# Patient Record
Sex: Male | Born: 1935
Health system: Southern US, Community
[De-identification: ages and names within clinical notes are randomized; demographics above are authoritative.]

## PROBLEM LIST (undated history)

## (undated) DIAGNOSIS — I2510 Atherosclerotic heart disease of native coronary artery without angina pectoris: Secondary | ICD-10-CM

## (undated) DIAGNOSIS — K5790 Diverticulosis of intestine, part unspecified, without perforation or abscess without bleeding: Secondary | ICD-10-CM

## (undated) DIAGNOSIS — I739 Peripheral vascular disease, unspecified: Secondary | ICD-10-CM

## (undated) DIAGNOSIS — N400 Enlarged prostate without lower urinary tract symptoms: Secondary | ICD-10-CM

## (undated) DIAGNOSIS — R059 Cough, unspecified: Secondary | ICD-10-CM

## (undated) DIAGNOSIS — I10 Essential (primary) hypertension: Secondary | ICD-10-CM

## (undated) DIAGNOSIS — F419 Anxiety disorder, unspecified: Secondary | ICD-10-CM

## (undated) DIAGNOSIS — K648 Other hemorrhoids: Secondary | ICD-10-CM

## (undated) DIAGNOSIS — R945 Abnormal results of liver function studies: Secondary | ICD-10-CM

## (undated) DIAGNOSIS — H269 Unspecified cataract: Secondary | ICD-10-CM

## (undated) DIAGNOSIS — I480 Paroxysmal atrial fibrillation: Secondary | ICD-10-CM

## (undated) DIAGNOSIS — R05 Cough: Secondary | ICD-10-CM

## (undated) DIAGNOSIS — T7840XA Allergy, unspecified, initial encounter: Secondary | ICD-10-CM

## (undated) DIAGNOSIS — I4510 Unspecified right bundle-branch block: Secondary | ICD-10-CM

## (undated) DIAGNOSIS — I200 Unstable angina: Secondary | ICD-10-CM

## (undated) DIAGNOSIS — I219 Acute myocardial infarction, unspecified: Secondary | ICD-10-CM

## (undated) DIAGNOSIS — I498 Other specified cardiac arrhythmias: Secondary | ICD-10-CM

## (undated) DIAGNOSIS — I779 Disorder of arteries and arterioles, unspecified: Secondary | ICD-10-CM

## (undated) DIAGNOSIS — I499 Cardiac arrhythmia, unspecified: Secondary | ICD-10-CM

## (undated) DIAGNOSIS — Z95810 Presence of automatic (implantable) cardiac defibrillator: Secondary | ICD-10-CM

## (undated) DIAGNOSIS — R7989 Other specified abnormal findings of blood chemistry: Secondary | ICD-10-CM

## (undated) DIAGNOSIS — I509 Heart failure, unspecified: Secondary | ICD-10-CM

## (undated) DIAGNOSIS — I469 Cardiac arrest, cause unspecified: Secondary | ICD-10-CM

## (undated) DIAGNOSIS — K219 Gastro-esophageal reflux disease without esophagitis: Secondary | ICD-10-CM

## (undated) DIAGNOSIS — D126 Benign neoplasm of colon, unspecified: Secondary | ICD-10-CM

## (undated) DIAGNOSIS — E785 Hyperlipidemia, unspecified: Secondary | ICD-10-CM

## (undated) HISTORY — PX: COLONOSCOPY: SHX174

## (undated) HISTORY — DX: Unspecified cataract: H26.9

## (undated) HISTORY — PX: APPENDECTOMY: SHX54

## (undated) HISTORY — DX: Acute myocardial infarction, unspecified: I21.9

## (undated) HISTORY — PX: EYE SURGERY: SHX253

## (undated) HISTORY — DX: Gastro-esophageal reflux disease without esophagitis: K21.9

## (undated) HISTORY — DX: Allergy, unspecified, initial encounter: T78.40XA

## (undated) HISTORY — DX: Other hemorrhoids: K64.8

## (undated) HISTORY — DX: Benign prostatic hyperplasia without lower urinary tract symptoms: N40.0

## (undated) HISTORY — DX: Atherosclerotic heart disease of native coronary artery without angina pectoris: I25.10

## (undated) HISTORY — DX: Essential (primary) hypertension: I10

## (undated) HISTORY — DX: Hyperlipidemia, unspecified: E78.5

## (undated) HISTORY — DX: Benign neoplasm of colon, unspecified: D12.6

## (undated) HISTORY — DX: Diverticulosis of intestine, part unspecified, without perforation or abscess without bleeding: K57.90

---

## 2001-03-18 ENCOUNTER — Encounter: Payer: Self-pay | Admitting: Neurosurgery

## 2001-03-20 ENCOUNTER — Encounter: Payer: Self-pay | Admitting: Neurosurgery

## 2001-03-20 ENCOUNTER — Observation Stay (HOSPITAL_COMMUNITY): Admission: RE | Admit: 2001-03-20 | Discharge: 2001-03-21 | Payer: Self-pay | Admitting: Neurosurgery

## 2001-06-15 ENCOUNTER — Ambulatory Visit (HOSPITAL_COMMUNITY): Admission: RE | Admit: 2001-06-15 | Discharge: 2001-06-15 | Payer: Self-pay | Admitting: Neurosurgery

## 2001-06-15 ENCOUNTER — Encounter: Payer: Self-pay | Admitting: Neurosurgery

## 2001-06-30 ENCOUNTER — Encounter: Admission: RE | Admit: 2001-06-30 | Discharge: 2001-06-30 | Payer: Self-pay | Admitting: Neurosurgery

## 2001-06-30 ENCOUNTER — Encounter: Payer: Self-pay | Admitting: Neurosurgery

## 2001-07-14 ENCOUNTER — Encounter: Admission: RE | Admit: 2001-07-14 | Discharge: 2001-07-14 | Payer: Self-pay | Admitting: Neurosurgery

## 2001-07-14 ENCOUNTER — Encounter: Payer: Self-pay | Admitting: Neurosurgery

## 2003-11-26 HISTORY — PX: CORONARY ARTERY BYPASS GRAFT: SHX141

## 2004-07-20 ENCOUNTER — Encounter: Payer: Self-pay | Admitting: Cardiology

## 2004-07-23 ENCOUNTER — Inpatient Hospital Stay (HOSPITAL_COMMUNITY): Admission: AD | Admit: 2004-07-23 | Discharge: 2004-08-01 | Payer: Self-pay | Admitting: Cardiology

## 2004-07-27 ENCOUNTER — Encounter: Payer: Self-pay | Admitting: Cardiology

## 2004-07-27 ENCOUNTER — Encounter (INDEPENDENT_AMBULATORY_CARE_PROVIDER_SITE_OTHER): Payer: Self-pay | Admitting: *Deleted

## 2004-08-27 ENCOUNTER — Encounter (HOSPITAL_COMMUNITY): Admission: RE | Admit: 2004-08-27 | Discharge: 2004-11-25 | Payer: Self-pay | Admitting: Cardiology

## 2004-10-12 ENCOUNTER — Ambulatory Visit: Payer: Self-pay | Admitting: Cardiology

## 2004-10-25 ENCOUNTER — Ambulatory Visit: Payer: Self-pay | Admitting: Cardiology

## 2004-11-26 ENCOUNTER — Encounter (HOSPITAL_COMMUNITY): Admission: RE | Admit: 2004-11-26 | Discharge: 2004-12-10 | Payer: Self-pay | Admitting: Cardiology

## 2004-12-11 ENCOUNTER — Encounter (HOSPITAL_COMMUNITY): Admission: RE | Admit: 2004-12-11 | Discharge: 2005-03-11 | Payer: Self-pay | Admitting: Cardiology

## 2005-06-21 ENCOUNTER — Ambulatory Visit: Payer: Self-pay | Admitting: Cardiology

## 2005-06-27 ENCOUNTER — Ambulatory Visit: Payer: Self-pay

## 2005-10-15 ENCOUNTER — Ambulatory Visit: Payer: Self-pay | Admitting: Cardiology

## 2005-10-16 ENCOUNTER — Ambulatory Visit: Payer: Self-pay | Admitting: Cardiology

## 2005-11-25 HISTORY — PX: CARDIAC CATHETERIZATION: SHX172

## 2006-01-16 ENCOUNTER — Ambulatory Visit: Payer: Self-pay | Admitting: Internal Medicine

## 2006-04-04 ENCOUNTER — Ambulatory Visit: Payer: Self-pay | Admitting: Cardiology

## 2006-04-14 ENCOUNTER — Ambulatory Visit: Payer: Self-pay | Admitting: Cardiology

## 2006-04-14 ENCOUNTER — Ambulatory Visit: Payer: Self-pay

## 2006-06-05 ENCOUNTER — Ambulatory Visit: Payer: Self-pay | Admitting: Internal Medicine

## 2006-08-26 ENCOUNTER — Ambulatory Visit: Payer: Self-pay | Admitting: Cardiology

## 2006-08-26 ENCOUNTER — Observation Stay (HOSPITAL_COMMUNITY): Admission: EM | Admit: 2006-08-26 | Discharge: 2006-08-27 | Payer: Self-pay | Admitting: Emergency Medicine

## 2006-09-10 ENCOUNTER — Ambulatory Visit: Payer: Self-pay | Admitting: Internal Medicine

## 2006-09-16 ENCOUNTER — Ambulatory Visit: Payer: Self-pay | Admitting: Cardiology

## 2006-10-21 ENCOUNTER — Ambulatory Visit: Payer: Self-pay | Admitting: Cardiology

## 2006-12-09 ENCOUNTER — Ambulatory Visit: Payer: Self-pay | Admitting: Internal Medicine

## 2006-12-17 ENCOUNTER — Ambulatory Visit: Payer: Self-pay | Admitting: Internal Medicine

## 2007-06-01 ENCOUNTER — Encounter: Payer: Self-pay | Admitting: Cardiology

## 2007-06-01 ENCOUNTER — Ambulatory Visit: Payer: Self-pay | Admitting: Cardiology

## 2007-06-01 ENCOUNTER — Observation Stay (HOSPITAL_COMMUNITY): Admission: EM | Admit: 2007-06-01 | Discharge: 2007-06-02 | Payer: Self-pay | Admitting: Emergency Medicine

## 2007-06-01 ENCOUNTER — Ambulatory Visit: Payer: Self-pay | Admitting: Cardiovascular Disease

## 2007-06-02 ENCOUNTER — Encounter: Payer: Self-pay | Admitting: Cardiovascular Disease

## 2007-06-08 ENCOUNTER — Ambulatory Visit: Payer: Self-pay

## 2007-06-09 ENCOUNTER — Ambulatory Visit: Payer: Self-pay | Admitting: Internal Medicine

## 2007-06-09 ENCOUNTER — Ambulatory Visit: Payer: Self-pay | Admitting: Cardiology

## 2007-08-24 ENCOUNTER — Ambulatory Visit: Payer: Self-pay | Admitting: Internal Medicine

## 2007-08-24 LAB — CONVERTED CEMR LAB
ALT: 22 units/L (ref 0–53)
AST: 27 units/L (ref 0–37)
BUN: 18 mg/dL (ref 6–23)
CO2: 32 meq/L (ref 19–32)
Calcium: 9.8 mg/dL (ref 8.4–10.5)
Chloride: 106 meq/L (ref 96–112)
Cholesterol: 144 mg/dL (ref 0–200)
Creatinine, Ser: 1 mg/dL (ref 0.4–1.5)
GFR calc Af Amer: 94 mL/min
GFR calc non Af Amer: 78 mL/min
Glucose, Bld: 94 mg/dL (ref 70–99)
HDL: 44.2 mg/dL (ref >39.0)
LDL Cholesterol: 80 mg/dL (ref 0–99)
PSA: 6.46 ng/mL — ABNORMAL HIGH (ref 0.10–4.00)
Potassium: 5.1 meq/L (ref 3.5–5.1)
Sodium: 143 meq/L (ref 135–145)
Total CHOL/HDL Ratio: 3.3
Total CK: 108 units/L (ref 7–195)
Triglycerides: 98 mg/dL (ref 0–149)
VLDL: 20 mg/dL (ref 0–40)

## 2007-08-27 ENCOUNTER — Ambulatory Visit: Payer: Self-pay | Admitting: Internal Medicine

## 2007-09-01 ENCOUNTER — Encounter: Payer: Self-pay | Admitting: Internal Medicine

## 2007-09-01 DIAGNOSIS — N400 Enlarged prostate without lower urinary tract symptoms: Secondary | ICD-10-CM | POA: Insufficient documentation

## 2007-09-01 DIAGNOSIS — E785 Hyperlipidemia, unspecified: Secondary | ICD-10-CM | POA: Insufficient documentation

## 2007-09-01 DIAGNOSIS — I10 Essential (primary) hypertension: Secondary | ICD-10-CM | POA: Insufficient documentation

## 2007-09-01 DIAGNOSIS — K219 Gastro-esophageal reflux disease without esophagitis: Secondary | ICD-10-CM | POA: Insufficient documentation

## 2007-09-01 DIAGNOSIS — I2510 Atherosclerotic heart disease of native coronary artery without angina pectoris: Secondary | ICD-10-CM | POA: Insufficient documentation

## 2007-09-09 ENCOUNTER — Encounter: Payer: Self-pay | Admitting: Internal Medicine

## 2007-09-09 ENCOUNTER — Ambulatory Visit: Payer: Self-pay | Admitting: Internal Medicine

## 2007-09-09 DIAGNOSIS — M549 Dorsalgia, unspecified: Secondary | ICD-10-CM | POA: Insufficient documentation

## 2007-09-11 ENCOUNTER — Encounter: Payer: Self-pay | Admitting: Internal Medicine

## 2007-09-26 HISTORY — PX: LUMBAR FUSION: SHX111

## 2007-12-02 ENCOUNTER — Ambulatory Visit: Payer: Self-pay | Admitting: Internal Medicine

## 2007-12-02 LAB — CONVERTED CEMR LAB
ALT: 18 units/L (ref 0–53)
AST: 28 units/L (ref 0–37)
BUN: 15 mg/dL (ref 6–23)
CO2: 30 meq/L (ref 19–32)
Calcium: 9.9 mg/dL (ref 8.4–10.5)
Chloride: 103 meq/L (ref 96–112)
Cholesterol: 145 mg/dL (ref 0–200)
Creatinine, Ser: 1 mg/dL (ref 0.4–1.5)
GFR calc Af Amer: 94 mL/min
GFR calc non Af Amer: 78 mL/min
Glucose, Bld: 96 mg/dL (ref 70–99)
HDL: 41.5 mg/dL (ref >39.0)
LDL Cholesterol: 88 mg/dL (ref 0–99)
Potassium: 4.6 meq/L (ref 3.5–5.1)
Sodium: 140 meq/L (ref 135–145)
Total CHOL/HDL Ratio: 3.5
Total CK: 162 units/L (ref 7–195)
Triglycerides: 80 mg/dL (ref 0–149)
VLDL: 16 mg/dL (ref 0–40)

## 2007-12-07 ENCOUNTER — Ambulatory Visit: Payer: Self-pay | Admitting: Internal Medicine

## 2007-12-07 DIAGNOSIS — G4700 Insomnia, unspecified: Secondary | ICD-10-CM | POA: Insufficient documentation

## 2007-12-17 ENCOUNTER — Ambulatory Visit: Payer: Self-pay | Admitting: Cardiology

## 2008-01-05 ENCOUNTER — Ambulatory Visit: Payer: Medicare Other | Admitting: Neurosurgery

## 2008-03-07 ENCOUNTER — Ambulatory Visit: Payer: Self-pay | Admitting: Internal Medicine

## 2008-06-22 ENCOUNTER — Ambulatory Visit: Payer: Self-pay | Admitting: Cardiology

## 2008-06-22 LAB — CONVERTED CEMR LAB
ALT: 19 units/L (ref 0–53)
AST: 27 units/L (ref 0–37)
Albumin: 3.6 g/dL (ref 3.5–5.2)
Alkaline Phosphatase: 57 units/L (ref 39–117)
BUN: 16 mg/dL (ref 6–23)
Basophils Absolute: 0 10*3/uL (ref 0.0–0.1)
Basophils Relative: 0.7 % (ref 0.0–3.0)
Bilirubin, Direct: 0.1 mg/dL (ref 0.0–0.3)
CO2: 30 meq/L (ref 19–32)
Calcium: 9.9 mg/dL (ref 8.4–10.5)
Chloride: 101 meq/L (ref 96–112)
Cholesterol: 142 mg/dL (ref 0–200)
Creatinine, Ser: 1.1 mg/dL (ref 0.4–1.5)
Eosinophils Absolute: 0.2 10*3/uL (ref 0.0–0.7)
Eosinophils Relative: 4.2 % (ref 0.0–5.0)
GFR calc Af Amer: 84 mL/min
GFR calc non Af Amer: 70 mL/min
Glucose, Bld: 102 mg/dL — ABNORMAL HIGH (ref 70–99)
HCT: 36.1 % — ABNORMAL LOW (ref 39.0–52.0)
HDL: 35.5 mg/dL — ABNORMAL LOW (ref >39.0)
Hemoglobin: 12.5 g/dL — ABNORMAL LOW (ref 13.0–17.0)
LDL Cholesterol: 93 mg/dL (ref 0–99)
Lymphocytes Relative: 26.9 % (ref 12.0–46.0)
MCHC: 34.6 g/dL (ref 30.0–36.0)
MCV: 90.4 fL (ref 78.0–100.0)
Monocytes Absolute: 0.4 10*3/uL (ref 0.1–1.0)
Monocytes Relative: 6.4 % (ref 3.0–12.0)
Neutro Abs: 3.7 10*3/uL (ref 1.4–7.7)
Neutrophils Relative %: 61.8 % (ref 43.0–77.0)
Platelets: 200 10*3/uL (ref 150–400)
Potassium: 4.5 meq/L (ref 3.5–5.1)
RBC: 4 M/uL — ABNORMAL LOW (ref 4.22–5.81)
RDW: 12.2 % (ref 11.5–14.6)
Sodium: 137 meq/L (ref 135–145)
TSH: 1.39 microintl units/mL (ref 0.35–5.50)
Total Bilirubin: 0.6 mg/dL (ref 0.3–1.2)
Total CHOL/HDL Ratio: 4
Total Protein: 7.3 g/dL (ref 6.0–8.3)
Triglycerides: 68 mg/dL (ref 0–149)
VLDL: 14 mg/dL (ref 0–40)
WBC: 5.9 10*3/uL (ref 4.5–10.5)

## 2008-07-15 ENCOUNTER — Ambulatory Visit: Payer: Self-pay | Admitting: Cardiovascular Disease

## 2008-08-24 ENCOUNTER — Encounter: Payer: Self-pay | Admitting: Internal Medicine

## 2008-09-07 ENCOUNTER — Ambulatory Visit: Payer: Self-pay | Admitting: Internal Medicine

## 2008-10-11 ENCOUNTER — Ambulatory Visit: Payer: Self-pay | Admitting: Cardiology

## 2008-10-11 ENCOUNTER — Encounter: Payer: Self-pay | Admitting: Cardiology

## 2008-10-11 DIAGNOSIS — IMO0001 Reserved for inherently not codable concepts without codable children: Secondary | ICD-10-CM | POA: Insufficient documentation

## 2008-10-11 DIAGNOSIS — Z951 Presence of aortocoronary bypass graft: Secondary | ICD-10-CM | POA: Insufficient documentation

## 2008-12-08 ENCOUNTER — Encounter: Payer: Self-pay | Admitting: Internal Medicine

## 2009-03-01 ENCOUNTER — Ambulatory Visit: Payer: Self-pay | Admitting: Internal Medicine

## 2009-03-01 LAB — CONVERTED CEMR LAB
ALT: 30 units/L (ref 0–53)
AST: 30 units/L (ref 0–37)
Albumin: 3.8 g/dL (ref 3.5–5.2)
Alkaline Phosphatase: 63 units/L (ref 39–117)
BUN: 10 mg/dL (ref 6–23)
Bilirubin, Direct: 0.1 mg/dL (ref 0.0–0.3)
CO2: 31 meq/L (ref 19–32)
Calcium: 9.8 mg/dL (ref 8.4–10.5)
Chloride: 105 meq/L (ref 96–112)
Cholesterol: 150 mg/dL (ref 0–200)
Creatinine, Ser: 1 mg/dL (ref 0.4–1.5)
GFR calc non Af Amer: 77.55 mL/min (ref >60)
Glucose, Bld: 90 mg/dL (ref 70–99)
HDL: 47.2 mg/dL (ref >39.00)
LDL Cholesterol: 91 mg/dL (ref 0–99)
Potassium: 4.8 meq/L (ref 3.5–5.1)
Sodium: 140 meq/L (ref 135–145)
TSH: 2.12 microintl units/mL (ref 0.35–5.50)
Total Bilirubin: 1.1 mg/dL (ref 0.3–1.2)
Total CHOL/HDL Ratio: 3
Total Protein: 6.7 g/dL (ref 6.0–8.3)
Triglycerides: 59 mg/dL (ref 0.0–149.0)
VLDL: 11.8 mg/dL (ref 0.0–40.0)

## 2009-03-08 ENCOUNTER — Ambulatory Visit: Payer: Self-pay | Admitting: Internal Medicine

## 2009-04-17 ENCOUNTER — Encounter: Payer: Self-pay | Admitting: Cardiology

## 2009-05-18 ENCOUNTER — Ambulatory Visit: Payer: Self-pay | Admitting: Cardiology

## 2009-06-05 ENCOUNTER — Ambulatory Visit: Payer: Self-pay | Admitting: Cardiology

## 2009-06-07 LAB — CONVERTED CEMR LAB
ALT: 21 U/L
AST: 27 U/L
Albumin: 3.6 g/dL
Alkaline Phosphatase: 64 U/L
BUN: 14 mg/dL
Basophils Absolute: 0 K/uL
Basophils Relative: 0.4 %
Bilirubin, Direct: 0.1 mg/dL
CO2: 29 meq/L
Calcium: 9.7 mg/dL
Chloride: 103 meq/L
Cholesterol: 157 mg/dL
Creatinine, Ser: 1 mg/dL
Eosinophils Absolute: 0.2 K/uL
Eosinophils Relative: 4 %
GFR calc non Af Amer: 77.51 mL/min
Glucose, Bld: 95 mg/dL
HCT: 39.4 %
HDL: 50.7 mg/dL
Hemoglobin: 13.6 g/dL
LDL Cholesterol: 96 mg/dL
Lymphocytes Relative: 31.1 %
Lymphs Abs: 1.8 K/uL
MCHC: 34.6 g/dL
MCV: 92 fL
Monocytes Absolute: 0.4 K/uL
Monocytes Relative: 6.7 %
Neutro Abs: 3.5 K/uL
Neutrophils Relative %: 57.8 %
Platelets: 142 K/uL — ABNORMAL LOW
Potassium: 5 meq/L
RBC: 4.28 M/uL
RDW: 11.9 %
Sodium: 138 meq/L
Total Bilirubin: 0.9 mg/dL
Total CHOL/HDL Ratio: 3
Total Protein: 7.1 g/dL
Triglycerides: 54 mg/dL
VLDL: 10.8 mg/dL
WBC: 5.9 10*3/microliter

## 2009-06-19 ENCOUNTER — Encounter: Payer: Self-pay | Admitting: Internal Medicine

## 2009-06-21 ENCOUNTER — Encounter: Payer: Self-pay | Admitting: Cardiology

## 2009-06-26 ENCOUNTER — Telehealth: Payer: Self-pay | Admitting: Cardiology

## 2009-07-11 ENCOUNTER — Ambulatory Visit: Payer: Self-pay

## 2009-07-11 ENCOUNTER — Encounter: Payer: Self-pay | Admitting: Cardiology

## 2009-07-20 ENCOUNTER — Encounter (INDEPENDENT_AMBULATORY_CARE_PROVIDER_SITE_OTHER): Payer: Self-pay | Admitting: *Deleted

## 2009-07-25 ENCOUNTER — Encounter: Payer: Self-pay | Admitting: Cardiology

## 2009-09-22 ENCOUNTER — Encounter: Payer: Self-pay | Admitting: Cardiology

## 2009-11-29 ENCOUNTER — Ambulatory Visit: Payer: Self-pay | Admitting: Internal Medicine

## 2009-11-29 LAB — CONVERTED CEMR LAB
ALT: 22 units/L (ref 0–53)
AST: 29 units/L (ref 0–37)
Albumin: 3.8 g/dL (ref 3.5–5.2)
Alkaline Phosphatase: 55 units/L (ref 39–117)
BUN: 13 mg/dL (ref 6–23)
Bilirubin, Direct: 0.1 mg/dL (ref 0.0–0.3)
CO2: 30 meq/L (ref 19–32)
Calcium: 10.3 mg/dL (ref 8.4–10.5)
Chloride: 105 meq/L (ref 96–112)
Cholesterol: 163 mg/dL (ref 0–200)
Creatinine, Ser: 1.2 mg/dL (ref 0.4–1.5)
GFR calc non Af Amer: 62.71 mL/min (ref >60)
Glucose, Bld: 93 mg/dL (ref 70–99)
HDL: 53.6 mg/dL (ref >39.00)
LDL Cholesterol: 96 mg/dL (ref 0–99)
Potassium: 5.2 meq/L — ABNORMAL HIGH (ref 3.5–5.1)
Sodium: 140 meq/L (ref 135–145)
TSH: 1.44 microintl units/mL (ref 0.35–5.50)
Total Bilirubin: 1 mg/dL (ref 0.3–1.2)
Total CHOL/HDL Ratio: 3
Total Protein: 7.6 g/dL (ref 6.0–8.3)
Triglycerides: 65 mg/dL (ref 0.0–149.0)
VLDL: 13 mg/dL (ref 0.0–40.0)

## 2009-12-11 ENCOUNTER — Ambulatory Visit: Payer: Self-pay | Admitting: Internal Medicine

## 2010-05-04 ENCOUNTER — Telehealth (INDEPENDENT_AMBULATORY_CARE_PROVIDER_SITE_OTHER): Payer: Self-pay | Admitting: *Deleted

## 2010-06-08 ENCOUNTER — Encounter (INDEPENDENT_AMBULATORY_CARE_PROVIDER_SITE_OTHER): Payer: Self-pay | Admitting: *Deleted

## 2010-06-08 ENCOUNTER — Ambulatory Visit: Payer: Self-pay | Admitting: Cardiology

## 2010-06-08 LAB — CONVERTED CEMR LAB
ALT: 22 units/L (ref 0–53)
AST: 27 units/L (ref 0–37)
Albumin: 4 g/dL (ref 3.5–5.2)
Alkaline Phosphatase: 75 units/L (ref 39–117)
BUN: 19 mg/dL (ref 6–23)
Basophils Absolute: 0 10*3/uL (ref 0.0–0.1)
Basophils Relative: 0.4 % (ref 0.0–3.0)
Bilirubin, Direct: 0.1 mg/dL (ref 0.0–0.3)
CO2: 31 meq/L (ref 19–32)
Calcium: 9.9 mg/dL (ref 8.4–10.5)
Chloride: 108 meq/L (ref 96–112)
Cholesterol: 151 mg/dL (ref 0–200)
Creatinine, Ser: 1 mg/dL (ref 0.4–1.5)
Eosinophils Absolute: 0.2 10*3/uL (ref 0.0–0.7)
Eosinophils Relative: 3.1 % (ref 0.0–5.0)
GFR calc non Af Amer: 80.05 mL/min (ref >60)
Glucose, Bld: 87 mg/dL (ref 70–99)
HCT: 39.4 % (ref 39.0–52.0)
HDL: 52.6 mg/dL (ref >39.00)
Hemoglobin: 13.5 g/dL (ref 13.0–17.0)
LDL Cholesterol: 83 mg/dL (ref 0–99)
Lymphocytes Relative: 34.6 % (ref 12.0–46.0)
Lymphs Abs: 2.1 10*3/uL (ref 0.7–4.0)
MCHC: 34.3 g/dL (ref 30.0–36.0)
MCV: 91.6 fL (ref 78.0–100.0)
Monocytes Absolute: 0.4 10*3/uL (ref 0.1–1.0)
Monocytes Relative: 7.2 % (ref 3.0–12.0)
Neutro Abs: 3.4 10*3/uL (ref 1.4–7.7)
Neutrophils Relative %: 54.7 % (ref 43.0–77.0)
Platelets: 139 10*3/uL — ABNORMAL LOW (ref 150.0–400.0)
Potassium: 5.1 meq/L (ref 3.5–5.1)
RBC: 4.31 M/uL (ref 4.22–5.81)
RDW: 13.3 % (ref 11.5–14.6)
Sodium: 138 meq/L (ref 135–145)
Total Bilirubin: 0.6 mg/dL (ref 0.3–1.2)
Total CHOL/HDL Ratio: 3
Total Protein: 7 g/dL (ref 6.0–8.3)
Triglycerides: 78 mg/dL (ref 0.0–149.0)
VLDL: 15.6 mg/dL (ref 0.0–40.0)
WBC: 6.1 10*3/uL (ref 4.5–10.5)

## 2010-06-15 ENCOUNTER — Ambulatory Visit: Payer: Self-pay | Admitting: Cardiology

## 2010-10-04 ENCOUNTER — Encounter: Payer: Self-pay | Admitting: Internal Medicine

## 2010-12-07 ENCOUNTER — Other Ambulatory Visit: Payer: Self-pay | Admitting: Internal Medicine

## 2010-12-07 ENCOUNTER — Ambulatory Visit
Admission: RE | Admit: 2010-12-07 | Discharge: 2010-12-07 | Payer: Self-pay | Source: Home / Self Care | Attending: Internal Medicine | Admitting: Internal Medicine

## 2010-12-07 LAB — BASIC METABOLIC PANEL
BUN: 17 mg/dL (ref 6–23)
CO2: 31 mEq/L (ref 19–32)
Calcium: 10.2 mg/dL (ref 8.4–10.5)
Chloride: 105 mEq/L (ref 96–112)
Creatinine, Ser: 0.9 mg/dL (ref 0.4–1.5)
GFR: 89.45 mL/min (ref >60.00)
Glucose, Bld: 93 mg/dL (ref 70–99)
Potassium: 4.7 mEq/L (ref 3.5–5.1)
Sodium: 140 mEq/L (ref 135–145)

## 2010-12-07 LAB — HEPATIC FUNCTION PANEL
ALT: 17 U/L (ref 0–53)
AST: 27 U/L (ref 0–37)
Albumin: 3.8 g/dL (ref 3.5–5.2)
Alkaline Phosphatase: 61 U/L (ref 39–117)
Bilirubin, Direct: 0.1 mg/dL (ref 0.0–0.3)
Total Bilirubin: 0.8 mg/dL (ref 0.3–1.2)
Total Protein: 6.9 g/dL (ref 6.0–8.3)

## 2010-12-07 LAB — LIPID PANEL
Cholesterol: 185 mg/dL (ref 0–200)
HDL: 52 mg/dL (ref >39.00)
LDL Cholesterol: 121 mg/dL — ABNORMAL HIGH (ref 0–99)
Total CHOL/HDL Ratio: 4
Triglycerides: 60 mg/dL (ref 0.0–149.0)
VLDL: 12 mg/dL (ref 0.0–40.0)

## 2010-12-07 LAB — CK: Total CK: 176 U/L (ref 7–232)

## 2010-12-07 LAB — TSH: TSH: 1.24 u[IU]/mL (ref 0.35–5.50)

## 2010-12-12 ENCOUNTER — Ambulatory Visit
Admission: RE | Admit: 2010-12-12 | Discharge: 2010-12-12 | Payer: Self-pay | Source: Home / Self Care | Attending: Internal Medicine | Admitting: Internal Medicine

## 2010-12-12 DIAGNOSIS — R0989 Other specified symptoms and signs involving the circulatory and respiratory systems: Secondary | ICD-10-CM | POA: Insufficient documentation

## 2010-12-12 DIAGNOSIS — R002 Palpitations: Secondary | ICD-10-CM | POA: Insufficient documentation

## 2010-12-13 ENCOUNTER — Encounter: Payer: Self-pay | Admitting: Internal Medicine

## 2010-12-14 ENCOUNTER — Encounter: Payer: Self-pay | Admitting: Internal Medicine

## 2010-12-14 ENCOUNTER — Ambulatory Visit: Admission: RE | Admit: 2010-12-14 | Discharge: 2010-12-14 | Payer: Self-pay | Source: Home / Self Care

## 2010-12-25 NOTE — Letter (Signed)
Summary: Engineer, materials at [facility]  [address]   [place], [place] [address]   Phone: [phone]  Fax: [phone]    July 20, 2009 MRN: [id number]   [name] Christensen [address] [place], [place]  [address]   Dear [name],  Your test ordered by Selena Batten has been reviewed by your physician (or physician assistant) and was found to be normal or stable. Your physician (or physician assistant) felt no changes were needed at this time.  __X__ Echocardiogram  ____ Cardiac Stress Test  ____ Lab Work  ____ Peripheral vascular study of arms, legs or neck  ____ CT scan or X-ray  ____ Lung or Breathing test  ____ Other:   Thank you.   Hoover Brunette, LPN    Duane Boston, M.D., F.A.C.C. Thressa Sheller, M.D., F.A.C.C. Oneal Grout, M.D., F.A.C.C. Cheree Ditto, M.D., F.A.C.C. Daiva Nakayama, M.D., F.A.C.C. Kenney Houseman, M.D., F.A.C.C. Jeanne Ivan, PA-C

## 2010-12-25 NOTE — Assessment & Plan Note (Signed)
Medications Added OXYCODONE HCL 15 MG  TABS (OXYCODONE HCL) 1 qid as needed pain PREDNISONE 10 MG TABS (PREDNISONE) 60mg  once daily x 2d; 30mg  once daily x 2 d;20mg  once daily x 5d; 10mg  once daily x5d. Take pc.       21  22  23  24  25    Vital Signs:  Patient Profile:   77 Years Old Male Weight:      159 pounds Pulse rate:   60 / minute BP sitting:   145 / 72                 Chief Complaint:  Back Pain.  History of Present Illness: LBPx1 mo; lately 10/10 in intensity w/ irrad. to B LE R>L  Current Allergies: ! PENICILLIN PRAVACHOL  Past Medical History:    Reviewed history from 08/27/2007 and no changes required:       Coronary artery disease       GERD       Hyperlipidemia       Hypertension       Benign prostatic hypertrophy   Family History:    Family History of CAD Male 1st degree relative <60  Social History:    Married    Never Smoked    Regular exercise-yes   Risk Factors:  Tobacco use:  never Exercise:  yes    Physical Exam  General:     uncomfortable-appearing.   Neck:     No deformities, masses, or tenderness noted. Lungs:     Normal respiratory effort, chest expands symmetrically. Lungs are clear to auscultation, no crackles or wheezes. Heart:     Normal rate and regular rhythm. S1 and S2 normal without gallop, murmur, click, rub or other extra sounds. Abdomen:     Bowel sounds positive,abdomen soft and non-tender without masses, organomegaly or hernias noted. Msk:     Lumbar-sacral sine is tender to palpation over paraspinal muscles and painfull with the ROM. Strait leg elev is +/- on R, - on L  Skin:     Intact without suspicious lesions or rashes   Problems:  Medical Problems Added: 1)  Dx of Back Pain  (ICD-724.5) 2)  Dx of Family History of Cad Male 1st Degree Relative <60  (ICD-V16.49)   Impression & Recommendations:  Problem # 1:  BACK PAIN (ICD-724.5) Assessment: New  His updated medication list for  this problem includ  His updated medication list for this problem inclu    Oxycodone Hcl 15 Mg Tabs (Oxycodone hcl) .[name]... 1 qid as needed as needed Predn. taper: 60mg  once daily ... w/Axid qd MRI NS cons. Dr Channing Mutters  His updated medication list for this problem includes:    Aspir-low 81 Mg Tbec (Aspirin) .[name]... 1 once daily pc    Oxycodone Hcl 15 Mg Tabs (Oxycodone hcl) .[name]... 1 qid as needed pain   Complete Medication List: 1)  Atenolol 50 Mg Tabs (Atenolol) .... Qd 2)  Plavix 75 Mg Tabs (Clopidogrel bisulfate) .... Qd 3)  Lisinopril 10 Mg Tabs (Lisinopril) .... Once daily 4)  Lipitor 40 Mg Tabs (Atorvastatin calcium) .... 1/2 qd 5)  Flomax 0.4 Mg Cp24 (Tamsulosin hcl) .[name].. 1 qd 6)  Aspir-low 81 Mg Tbec (Aspirin) .[name].. 1 once daily pc 7)  Oxycodone Hcl 15 Mg Tabs (Oxycodone hcl) .[name].. 1 qid as needed pain 8)  Prednisone 10 Mg Tabs (Prednisone) .... 60mg  once daily x 2d; 30mg  once daily x 2 d;20mg  once daily x 5d; 10mg  once  daily x5d. take pc.   Patient Instructions: 1)  Please schedule a follow-up appointment as needed.    Prescriptions: OXYCODONE HCL 15 MG  TABS (OXYCODONE HCL) 1 qid as needed pain  #60 x 0   Entered and Authorized by:   Tresa Garter MD   Signed by:   Tresa Garter MD on 09/09/2007   Method used:   Print then Give to Patient   RxID:   [id number]  ]

## 2010-12-25 NOTE — Assessment & Plan Note (Signed)
Summary: 1 Y R FU PER JULY REMINDER   Visit Type:  Follow-up Primary Provider:  Tresa Garter MD   History of Present Illness: the patient is a 77 year old male with history of coronary artery disease, status post coronary bypass grafting 2005. The patient has a chronic right bundle branch block. He has been doing well. He reports no chest pain or shortness of breath. The patient had a stress echocardiogram done a year ago with a baseline ejection fraction of 55% with inferoseptal and inferobasal hypokinesis. An EKG today he has occasional PVCs but is asymptomatic.[name] He reports no palpitations orthopnea PND presyncope or syncope. His lipid panel is at goal. He remains physically very active and walks 3 miles a day.  Preventive Screening-Counseling & Management  Alcohol-Tobacco     Smoking Status: never  Current Medications (verified): 1)  Atenolol 50 Mg Tabs (Atenolol) .... Once Daily 1 By Mouth 2)  Plavix 75 Mg Tabs (Clopidogrel Bisulfate) .... Take 1 Tablet By Mouth Once A Day 3)  Flomax 0.4 Mg Caps (Tamsulosin Hcl) .... Take 1 Tablet By Mouth Once A Day Hold When Taking Uroxatral 4)  Axid Ar 75 Mg Tabs (Nizatidine) .... Take 1 Tablet By Mouth Once A Day As Needed 5)  Zolpidem Tartrate 10 Mg  Tabs (Zolpidem Tartrate) .... 1/2 or 1 By Mouth At [facility] Prn 6)  Lipitor 40 Mg Tabs (Atorvastatin Calcium) .[name].. 1 Once Daily 7)  Nitrostat 0.4 Mg Subl (Nitroglycerin) .[name].. 1 Sl As Needed 8)  Aspir-Low 81 Mg Tbec (Aspirin) .[name].. 1 Once Daily Pc 9)  Fish Oil 1000 Mg Caps (Omega-3 Fatty Acids) .... Take 2 Tablet By Mouth Two Times A Day 10)  Vitamin D3 1000 Unit  Tabs (Cholecalciferol) .[name].. 1 By Mouth Daily 11)  Valium 5 Mg Tabs (Diazepam) .... Take 1 Tablet By Mouth Once A Day 12)  Zetia 10 Mg Tabs (Ezetimibe) .... Take 1 Tablet By Mouth Once A Day (Only Uses When Not On Lipitor) 13)  Uroxatral 10 Mg Xr24h-Tab (Alfuzosin Hcl) .... Take 1 Tablet By Mouth Once A Day Hold When Taking Flomax 14)   Multivitamins  Tabs (Multiple Vitamin) .... Take 1 Tablet By Mouth Once A Day 15)  Doxycycline Hyclate 100 Mg Solr (Doxycycline Hyclate) .... Take 1 Tablet By Mouth Two Times A Day  Allergies (verified): 1)  ! Penicillin 2)  Pravachol 3)  Crestor 4)  Lipitor 5)  Niaspan (Niacin (Antihyperlipidemic)) 6)  Amoxicillin (Amoxicillin) 7)  Amoxicillin (Amoxicillin)  Comments:  Nurse/Medical Assistant: The patient's medication list and allergies were reviewed with the patient and were updated in the Medication and Allergy Lists.  Clinical Review Panels:  Cardiac Imaging Cardiac Cath Findings   CARDIAC CATHETERIZATION  IMPRESSION AND PLAN:  All three saphenous vein grafts are widely patent.  The LIMA to LAD graft is atretic.  However, he has no significant LAD  disease.  I suspect reflux as the etiology of his symptoms.    Salvadore Farber, MD (08/27/2006)    Past History:  Past Medical History: Last updated: 12/11/2009 Coronary artery disease Dr Andee Lineman status post coronary bypass grafting 2005 status post cardiac catheterization October 2007 with patent graft anatomy atretic left internal mammary artery to the LAD which is nonobstructive. Will restart study June 08, 2007 GERD Hyperlipidemia Hypertension Benign prostatic hypertrophy, elev PSA Dr Lindell Noe Bx 2010  Past Surgical History: Last updated: 06/14/2010 stent Lumbar fusion 09/2007 Cardiac Catheterization CABG  Family History: Last updated: 09/09/2007 Family History of CAD  Male 1st degree relative <60  Social History: Last updated: 09/09/2007 Married Never Smoked Regular exercise-yes  Risk Factors: Exercise: yes (09/09/2007)  Risk Factors: Smoking Status: never (06/15/2010)  Vital Signs:  Patient profile:   77 year old male Height:      68 inches Weight:      159 pounds Pulse rate:   57 / minute BP sitting:   137 / 77  (left arm) Cuff size:   regular  Vitals Entered By: Carlye Grippe (June 15, 2010 8:11 AM)  Physical Exam  Additional Exam:  General: Well-developed, well-nourished in no distress head: Normocephalic and atraumatic eyes PERRLA/EOMI intact, conjunctiva and lids normal nose: No deformity or lesions mouth normal dentition, normal posterior pharynx neck: Supple, no JVD.  No masses, thyromegaly or abnormal cervical nodes lungs: Normal breath sounds bilaterally without wheezing.  Normal percussion heart: regular rate and rhythm with normal S1 and S2, no S3 or S4.  PMI is normal.  No pathological murmurs abdomen: Normal bowel sounds, abdomen is soft and nontender without masses, organomegaly or hernias noted.  No hepatosplenomegaly musculoskeletal: Back normal, normal gait muscle strength and tone normal pulsus: Pulse is normal in all 4 extremities Extremities: No peripheral pitting edema neurologic: Alert and oriented x 3 skin: Intact without lesions or rashes cervical nodes: No significant adenopathy psychologic: Normal affect    Stress Echocardiogram  Procedure date:  07/20/2009  Findings:      Baseline EF 55%. Inferoseptal and inferobasal hypokinesis. Peak stress with stress no ischemic changes. Persistent right bundle branch block. Small area of the inferoseptum and inferobasal remain hypokinetic  Stress Echocardiogram  Procedure date:  06/15/2010  Findings:      normal sinus rhythm with right bundle branch block and occasional PVCs.  Impression & Recommendations:  Problem # 1:  CORONARY ARTERY BYPASS GRAFT, HX OF (ICD-V45.81) No recurrent chest pain. Patient is doing well. Stress echocardiogram less than a year ago was negative. Orders: EKG w/ Interpretation (93000)  Problem # 2:  HYPERLIPIDEMIA (ICD-272.4) lipid panel at goal. His updated medication list for this problem includes:    Lipitor 40 Mg Tabs (Atorvastatin calcium) .[name]... 1 once daily    Zetia 10 Mg Tabs (Ezetimibe) .[name]... Take 1 tablet by mouth once a day (only uses when not on  lipitor)  Problem # 3:  HYPERTENSION (ICD-401.9) blood pressure well controlled. His updated medication list for this problem includes:    Atenolol 50 Mg Tabs (Atenolol) ..... Once daily 1 by mouth    Aspir-low 81 Mg Tbec (Aspirin) .[name]... 1 once daily pc  Orders: EKG w/ Interpretation (93000)  Patient Instructions: 1)  Your physician recommends that you continue on your current medications as directed. Please refer to the Current Medication list given to you today. 2)  Follow up in  1 year.

## 2010-12-25 NOTE — Assessment & Plan Note (Signed)
Summary: 3 MO ROV/NWS  Medications Added AXID 150 MG  CAPS (NIZATIDINE) 1 by mouth once daily prn VITAMIN D3 1000 UNIT  TABS (CHOLECALCIFEROL) 1 qd ZOLPIDEM TARTRATE 10 MG  TABS (ZOLPIDEM TARTRATE) 1/2 or 1 by mouth at hs prn        Vital Signs:  Patient Profile:   77 Years Old Male Weight:      161 pounds Temp:     97.5 degrees F oral Pulse rate:   64 / minute BP sitting:   137 / 82  (right arm)  Vitals Entered By: Glendell Docker (December 07, 2007 8:32 AM)                 Chief Complaint:  Follow Up appt.  History of Present Illness: The patient presents for a follow up of hypertension,LBP, hyperlipidemia. C/o insomnia.   Current Allergies: ! PENICILLIN PRAVACHOL  Past Medical History:    Reviewed history from 08/27/2007 and no changes required:       Coronary artery disease       GERD       Hyperlipidemia       Hypertension       Benign prostatic hypertrophy  Past Surgical History:    stent    Lumbar fusion 09/2007   Family History:    Reviewed history from 09/09/2007 and no changes required:       Family History of CAD Male 1st degree relative <60  Social History:    Reviewed history from 09/09/2007 and no changes required:       Married       Never Smoked       Regular exercise-yes    Review of Systems       Back to work   Physical Exam  General:     Well-developed,well-nourished,in no acute distress; alert,appropriate and cooperative throughout examination Eyes:     No corneal or conjunctival inflammation noted. EOMI. Perrla. Funduscopic exam benign, without hemorrhages, exudates or papilledema. Vision grossly normal. Nose:     External nasal examination shows no deformity or inflammation. Nasal mucosa are pink and moist without lesions or exudates. Mouth:     Oral mucosa and oropharynx without lesions or exudates.  Teeth in good repair. Neck:     No deformities, masses, or tenderness noted. Lungs:     Normal respiratory effort,  chest expands symmetrically. Lungs are clear to auscultation, no crackles or wheezes. Heart:     Normal rate and regular rhythm. S1 and S2 normal without gallop, murmur, click, rub or other extra sounds. Abdomen:     Bowel sounds positive,abdomen soft and non-tender without masses, organomegaly or hernias noted. Msk:     Back brace Neurologic:     No cranial nerve deficits noted. Station and gait are normal. Plantar reflexes are down-going bilaterally. DTRs are symmetrical throughout. Sensory, motor and coordinative functions appear intact. Skin:     Intact without suspicious lesions or rashes Psych:     Cognition and judgment appear intact. Alert and cooperative with normal attention span and concentration. No apparent delusions, illusions, hallucinations    Impression & Recommendations:  Problem # 1:  BACK PAIN (ICD-724.5) Assessment: Improved Recovering from surgery. His updated medication list for this problem includes:    Aspir-low 81 Mg Tbec (Aspirin) .[name]... 1 once daily pc    Oxycodone Hcl 15 Mg Tabs (Oxycodone hcl) .[name]... 1 qid as needed pain   Problem # 2:  HYPERTENSION (ICD-401.9)  The following medications  were removed from the medication list:    Lisinopril 10 Mg Tabs (Lisinopril) ..... Once daily  His updated medication list for this problem includes:    Atenolol 50 Mg Tabs (Atenolol) ..... Qd   Problem # 3:  CORONARY ARTERY DISEASE (ICD-414.00)  The following medications were removed from the medication list:    Lisinopril 10 Mg Tabs (Lisinopril) ..... Once daily  His updated medication list for this problem includes:    Atenolol 50 Mg Tabs (Atenolol) ..... Qd    Plavix 75 Mg Tabs (Clopidogrel bisulfate) ..... Qd    Aspir-low 81 Mg Tbec (Aspirin) .[name]... 1 once daily pc   Problem # 4:  HYPERLIPIDEMIA (ICD-272.4)  His updated medication list for this problem includes:    Lipitor 40 Mg Tabs (Atorvastatin calcium) .[name]... 1/2 qd   Problem # 5:  INSOMNIA,  PERSISTENT (ICD-307.42) Assessment: New Zolpidem prn  Complete Medication List: 1)  Atenolol 50 Mg Tabs (Atenolol) .... Qd 2)  Plavix 75 Mg Tabs (Clopidogrel bisulfate) .... Qd 3)  Lipitor 40 Mg Tabs (Atorvastatin calcium) .... 1/2 qd 4)  Flomax 0.4 Mg Cp24 (Tamsulosin hcl) .[name].. 1 qd 5)  Aspir-low 81 Mg Tbec (Aspirin) .[name].. 1 once daily pc 6)  Oxycodone Hcl 15 Mg Tabs (Oxycodone hcl) .[name].. 1 qid as needed pain 7)  Prednisone 10 Mg Tabs (Prednisone) .... 60mg  once daily x 2d; 30mg  once daily x 2 d;20mg  once daily x 5d; 10mg  once daily x5d. take pc. 8)  Axid 150 Mg Caps (Nizatidine) .[name].. 1 by mouth once daily prn 9)  Vitamin D3 1000 Unit Tabs (Cholecalciferol) .[name].. 1 qd 10)  Zolpidem Tartrate 10 Mg Tabs (Zolpidem tartrate) .... 1/2 or 1 by mouth at hs prn   Patient Instructions: 1)  Please schedule a follow-up appointment in 3 months.    Prescriptions: ZOLPIDEM TARTRATE 10 MG  TABS (ZOLPIDEM TARTRATE) 1/2 or 1 by mouth at hs prn  #30 x 6   Entered and Authorized by:   Tresa Garter MD   Signed by:   Tresa Garter MD on 12/07/2007   Method used:   Print then Give to Patient   RxID:   [id number]  ]  Appended Document: Orders Update     Clinical Lists Changes  Orders: Added new Service order of Est. Patient Level IV (84696) - Signed

## 2010-12-25 NOTE — Letter (Signed)
Summary: [employer]  External Correspondence   Imported By: Dorise Hiss 07/25/2009 11:47:58  _____________________________________________________________________  External Attachment:    Type:   Image     Comment:   External Document  Appended Document: [employer] Info faxed by [facility] on 07/21/09

## 2010-12-25 NOTE — Op Note (Signed)
Summary: Operative Report  Operative Report   Imported By: Dorise Hiss 06/14/2010 16:55:39  _____________________________________________________________________  External Attachment:    Type:   Image     Comment:   External Document

## 2010-12-25 NOTE — Progress Notes (Signed)
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 Earnestine Leys regarding his MI in '05." He states he needs this ASAP so that he can return to work. Please advise what test I should order and then I assume he will need letter dictated. Initial call taken by: Cyril Loosen, RN, BSN,  June 26, 2009 3:42 PM  Follow-up for Phone Call        yes I agree, set the patient up for a stress echocardiographic study in [place] office.  If he is on beta-blocker I would hold his 24 hours before to procedure, other cardiac medications can be continued without any problem.  As soon as we have the results available I will dictate a letter Follow-up by: Lewayne Bunting, MD, [facility],  June 28, 2009 3:47 PM   New Allergies: AMOXICILLIN (AMOXICILLIN) AMOXICILLIN (AMOXICILLIN) New/Updated Medications: NIASPAN 500 MG CR-TABS (NIACIN (ANTIHYPERLIPIDEMIC)) Take 1 tablet by mouth at bedtime VALIUM 5 MG TABS (DIAZEPAM) Take 1 tablet by mouth once a day ZETIA 10 MG TABS (EZETIMIBE)  New Allergies: AMOXICILLIN (AMOXICILLIN) AMOXICILLIN (AMOXICILLIN)  Appended Document: DOT Clearance Pt notified. He will hold Atenolol for 24 hrs prior to test. He is aware Lynden Ang will schedule this and notify him of appt date. Pt will bring DOT letter by the office when around time for stress echo so that Dr. Earnestine Leys can complete this when he reviews the results.

## 2010-12-25 NOTE — Letter (Signed)
Summary: [facility]  [facility]   Imported By: Lennie Odor 10/15/2010 15:38:07  _____________________________________________________________________  External Attachment:    Type:   Image     Comment:   External Document

## 2010-12-25 NOTE — Assessment & Plan Note (Signed)
Summary: 6 MO ROA/NML   Vital Signs:  Patient Profile:   77 Years Old Male Weight:      153 pounds Temp:     97.3 degrees F oral Pulse rate:   64 / minute BP sitting:   136 / 64  (left arm)  Vitals Entered By: Tora Perches (September 07, 2008 8:21 AM)                 Chief Complaint:  Multiple medical problems or concerns.  History of Present Illness: The patient presents for a follow up of hypertension, HTN, hyperlipidemia, LBP, insomnia      Current Allergies (reviewed today): ! PENICILLIN PRAVACHOL CRESTOR LIPITOR NIASPAN (NIACIN (ANTIHYPERLIPIDEMIC))  Past Medical History:    Reviewed history from 08/27/2007 and no changes required:       Coronary artery disease Dr Andee Lineman       GERD       Hyperlipidemia       Hypertension       Benign prostatic hypertrophy, elev PSA Dr Lindell Noe   Family History:    Reviewed history from 09/09/2007 and no changes required:       Family History of CAD Male 1st degree relative <60  Social History:    Reviewed history from 09/09/2007 and no changes required:       Married       Never Smoked       Regular exercise-yes    Review of Systems  The patient denies fever, chest pain, syncope, prolonged cough, and abdominal pain.     Physical Exam  General:     Well-developed,well-nourished,in no acute distress; alert,appropriate and cooperative throughout examination Head:     Normocephalic and atraumatic without obvious abnormalities. No apparent alopecia or balding. Eyes:     No corneal or conjunctival inflammation noted. EOMI. Perrla. Funduscopic exam benign, without hemorrhages, exudates or papilledema. Vision grossly normal. Ears:     External ear exam shows no significant lesions or deformities.  Otoscopic examination reveals clear canals, tympanic membranes are intact bilaterally without bulging, retraction, inflammation or discharge. Hearing is grossly normal bilaterally. Nose:     External nasal examination  shows no deformity or inflammation. Nasal mucosa are pink and moist without lesions or exudates. Mouth:     Oral mucosa and oropharynx without lesions or exudates.  Teeth in good repair. Neck:     No deformities, masses, or tenderness noted. Lungs:     Normal respiratory effort, chest expands symmetrically. Lungs are clear to auscultation, no crackles or wheezes. Heart:     Normal rate and regular rhythm. S1 and S2 normal without gallop, murmur, click, rub or other extra sounds. Abdomen:     Bowel sounds positive,abdomen soft and non-tender without masses, organomegaly or hernias noted. Msk:     No deformity or scoliosis noted of thoracic or lumbar spine.   Neurologic:     No cranial nerve deficits noted. Station and gait are normal. Plantar reflexes are down-going bilaterally. DTRs are symmetrical throughout. Sensory, motor and coordinative functions appear intact. Skin:     Intact without suspicious lesions or rashes Psych:     Cognition and judgment appear intact. Alert and cooperative with normal attention span and concentration. No apparent delusions, illusions, hallucinations    Impression & Recommendations:  Problem # 1:  HYPERTENSION (ICD-401.9) Assessment: Improved  His updated medication list for this problem includes:    Atenolol 50 Mg Tabs (Atenolol) ..... Qd The labs were reviewd with the  patient.  BP today: 136/64 Prior BP: 125/71 (03/07/2008)  Labs Reviewed: Creat: 1.1 (06/22/2008) Chol: 142 (06/22/2008)   HDL: 35.5 (06/22/2008)   LDL: 93 (06/22/2008)   TG: 68 (06/22/2008)   Problem # 2:  HYPERLIPIDEMIA (ICD-272.4) Assessment: Improved  The following medications were removed from the medication list:    Simvastatin 20 Mg Tabs (Simvastatin) .[name]... 1 by mouth at bedtime  His updated medication list for this problem includes:    Lipitor 40 Mg Tabs (Atorvastatin calcium) .[name]... 1/2 once daily   Problem # 3:  BACK PAIN (ICD-724.5) Assessment: Improved  His  updated medication list for this problem includes:    Aspir-low 81 Mg Tbec (Aspirin) .[name]... 1 once daily pc   Problem # 4:  CORONARY ARTERY DISEASE (ICD-414.00) Assessment: Unchanged  His updated medication list for this problem includes:    Atenolol 50 Mg Tabs (Atenolol) ..... Qd    Plavix 75 Mg Tabs (Clopidogrel bisulfate) ..... Qd    Nitrostat 0.4 Mg Subl (Nitroglycerin) .[name]... As needed    Aspir-low 81 Mg Tbec (Aspirin) .[name]... 1 once daily pc   Problem # 5:  INSOMNIA, PERSISTENT (ICD-307.42) Assessment: Unchanged On prescription therapy   Complete Medication List: 1)  Atenolol 50 Mg Tabs (Atenolol) .... Qd 2)  Plavix 75 Mg Tabs (Clopidogrel bisulfate) .... Qd 3)  Flomax 0.4 Mg Cp24 (Tamsulosin hcl) .[name].. 1 qd 4)  Axid 150 Mg Caps (Nizatidine) .[name].. 1 by mouth once daily prn 5)  Vitamin D3 1000 Unit Tabs (Cholecalciferol) .[name].. 1 qd 6)  Zolpidem Tartrate 10 Mg Tabs (Zolpidem tartrate) .... 1/2 or 1 by mouth at hs prn 7)  Lipitor 40 Mg Tabs (Atorvastatin calcium) .... 1/2 once daily 8)  Nitrostat 0.4 Mg Subl (Nitroglycerin) .... As needed 9)  Aspir-low 81 Mg Tbec (Aspirin) .[name].. 1 once daily pc  Other Orders: Flu Vaccine 49yrs + (04540) Administration Flu vaccine (J8119)   Patient Instructions: 1)  Please schedule a follow-up appointment in 6 months.       Flu Vaccine Consent Questions     Do you have a history of severe allergic reactions to this vaccine? no    Any prior history of allergic reactions to egg and/or gelatin? no    Do you have a sensitivity to the preservative Thimersol? no    Do you have a past history of Guillan-Barre Syndrome? no    Do you currently have an acute febrile illness? no    Have you ever had a severe reaction to latex? no    Vaccine information given and explained to patient? yes    Are you currently pregnant? no    Lot Number:[id number]   Site Given  Left Deltoid Myles.Shearing   ]  Influenza Vaccine (to be given today)

## 2010-12-25 NOTE — Miscellaneous (Signed)
Summary: rx refill  Clinical Lists Changes  Medications: Rx of ATENOLOL 50 MG TABS (ATENOLOL) once daily 1 by mouth;  #90 x 4;  Signed;  Entered by: Hulan Fess, RN;  Authorized by: Lewayne Bunting, MD, [facility];  Method used: Electronically to [facility]  [phone], [place], [place], [place]  [address], Ph: [phone], Fax: [phone]    Prescriptions: ATENOLOL 50 MG TABS (ATENOLOL) once daily 1 by mouth  #90 x 4   Entered by:   Hulan Fess, RN   Authorized by:   Lewayne Bunting, MD, [facility]   Signed by:   Hulan Fess, RN on 04/17/2009   Method used:   Electronically to        [facility]  [address] (retail)       [address], [address]       [place], [place]  [address]       Ph: [phone]       Fax: [phone]   RxID:   [id number]

## 2010-12-25 NOTE — Assessment & Plan Note (Signed)
Summary: Cardiology Office Visit   Visit Type:  Follow-up PCP:  Tresa Garter MD   History of Present Illness: he patient is a 77 year old male with history of coronary disease status post coronary bypass grafting.  The patient reports no chest pain shortness of breath orthopnea PND.  From a cardiac perspective he appears to be stable.  He does complain of some muscle pain and proximal muscle weakness of.  He also reported he can take Niaspan.  He also stopped taking his Lipitor.  He denies any a pop in the PND palpitations or syncope    Prior Medication List:  ATENOLOL 50 MG TABS (ATENOLOL) qd PLAVIX 75 MG TABS (CLOPIDOGREL BISULFATE) qd FLOMAX 0.4 MG CP24 (TAMSULOSIN HCL) 1 qd AXID 150 MG  CAPS (NIZATIDINE) 1 by mouth once daily prn VITAMIN D3 1000 UNIT  TABS (CHOLECALCIFEROL) 1 qd ZOLPIDEM TARTRATE 10 MG  TABS (ZOLPIDEM TARTRATE) 1/2 or 1 by mouth at hs prn LIPITOR 40 MG TABS (ATORVASTATIN CALCIUM) 1/2 once daily NITROSTAT 0.4 MG SUBL (NITROGLYCERIN) as needed ASPIR-LOW 81 MG TBEC (ASPIRIN) 1 once daily pc   Current Allergies (reviewed today): ! PENICILLIN PRAVACHOL CRESTOR LIPITOR NIASPAN (NIACIN (ANTIHYPERLIPIDEMIC))  Past Medical History:    Coronary artery disease Dr Andee Lineman    status post coronary bypass grafting 2005    status post cardiac catheterization October 2007 with patent graft anatomy atretic left internal mammary artery to the LAD which is nonobstructive.    Will restart study June 08, 2007    GERD    Hyperlipidemia    Hypertension    Benign prostatic hypertrophy, elev PSA Dr Lindell Noe   Family History:    Reviewed history from 09/09/2007 and no changes required:       Family History of CAD Male 1st degree relative <60  Social History:    Reviewed history from 09/09/2007 and no changes required:       Married       Never Smoked       Regular exercise-yes     Review of Systems  The patient denies anorexia, fever, weight loss, weight  gain, vision loss, decreased hearing, hoarseness, chest pain, syncope, dyspnea on exertion, peripheral edema, prolonged cough, headaches, hemoptysis, abdominal pain, melena, hematochezia, severe indigestion/heartburn, hematuria, incontinence, genital sores, muscle weakness, suspicious skin lesions, transient blindness, difficulty walking, depression, unusual weight change, abnormal bleeding, enlarged lymph nodes, angioedema, and testicular masses.         the patient reports muscle pain.  Also some flulike symptoms.   Vital Signs:  Patient Profile:   77 Years Old Male Weight:      152 pounds Pulse rate:   56 / minute BP sitting:   137 / 72                  Physical Exam  General:     Well developed, well nourished, in no acute distress. Eyes:     PERRLA/EOM intact; conjunctiva and lids normal. Mouth:     Teeth, gums and palate normal. Oral mucosa normal. Neck:     Neck supple, no JVD. No masses, thyromegaly or abnormal cervical nodes. Lungs:     Clear bilaterally to auscultation and percussion. Heart:     Non-displaced PMI, chest non-tender; regular rate and rhythm, S1, S2 without murmurs, rubs or gallops. Carotid upstroke normal, no bruit. Normal abdominal aortic size, no bruits. Femorals normal pulses, no bruits. Pedals normal pulses. No edema, no varicosities. Abdomen:     Bowel  sounds positive; abdomen soft and non-tender without masses, organomegaly, or hernias noted. No hepatosplenomegaly. Msk:     Back normal, normal gait. Muscle strength and tone normal. Extremities:     No clubbing or cyanosis. Neurologic:     Alert and oriented x 3. Skin:     Intact without lesions or rashes. Psych:     Normal affect.   Impression & Recommendations:  Problem # 1:  CORONARY ARTERY BYPASS GRAFT, HX OF (ICD-V45.81) the patient reports no chest pain.  He has no unstable symptoms.  Stress test in 2008 was negative for ischemia.  No further workup is required  Problem # 2:   MUSCLE PAIN (ICD-729.1) the patient still complains of muscle pain which is thought likely related to statin drug therapy.  However he does complain of some brought proximal muscle weakness and will obtain a sed rate as well as connective tissue disease serology.  Problem # 3:  HYPERLIPIDEMIA (ICD-272.4) [place] His updated medication list for this problem includes:    Lipitor 40 Mg Tabs (Atorvastatin calcium) .[name]... 1/2 once daily    Patient Instructions: 1)  patient will have laboratory work done as outlined above. 2)  Patient will follow-up with [place] in 6 months.    ]  Appended Document: Cardiology Office Visit and laboratory work was reviewed.  Sed rate was 16 .additional serologic testing was negative in particular the ANA was negative.  Liver function tests were within normal limits.  Cholesterol is 158 LDL was 82 try to stretch 045.  The patient can continue on current regimen.

## 2010-12-25 NOTE — Cardiovascular Report (Signed)
Summary: Cardiac Catheterization  Cardiac Catheterization   Imported By: Dorise Hiss 06/14/2010 16:57:11  _____________________________________________________________________  External Attachment:    Type:   Image     Comment:   External Document

## 2010-12-25 NOTE — Assessment & Plan Note (Signed)
Summary: 6 mos f/u $50 cd-PER PT RS TO JAN--#--STC   Vital Signs:  Patient profile:   77 year old male Height:      68 inches Weight:      161 pounds BMI:     24.57 Temp:     96.7 degrees F oral Pulse rate:   76 / minute BP sitting:   162 / 60  (left arm)  Vitals Entered By: Tora Perches (December 11, 2009 8:05 AM) CC: f/u Is Patient Diabetic? No   CC:  f/u.  Preventive Screening-Counseling & Management  Alcohol-Tobacco     Smoking Status: never  Current Medications (verified): 1)  Atenolol 50 Mg Tabs (Atenolol) .... Once Daily 1 By Mouth 2)  Plavix 75 Mg Tabs (Clopidogrel Bisulfate) .... Take 1 Tablet By Mouth Once A Day 3)  Flomax 0.4 Mg Cp24 (Tamsulosin Hcl) .[name].. 1 Qd 4)  Axid 150 Mg  Caps (Nizatidine) .[name].. 1 By Mouth Once Daily Prn 5)  Vitamin D3 1000 Unit  Tabs (Cholecalciferol) .[name].. 1 Qd 6)  Zolpidem Tartrate 10 Mg  Tabs (Zolpidem Tartrate) .... 1/2 or 1 By Mouth At [facility] Prn 7)  Lipitor 40 Mg Tabs (Atorvastatin Calcium) .[name].. 1 Once Daily 8)  Nitrostat 0.4 Mg Subl (Nitroglycerin) .[name].. 1 Sl As Needed 9)  Aspir-Low 81 Mg Tbec (Aspirin) .[name].. 1 Once Daily Pc 10)  Fish Oil  Oil (Fish Oil) .... Qid 11)  Vitamin D3 1000 Unit  Tabs (Cholecalciferol) .[name].. 1 By Mouth Daily 12)  Niaspan 500 Mg Cr-Tabs (Niacin (Antihyperlipidemic)) .... Take 1 Tablet By Mouth At Bedtime 13)  Valium 5 Mg Tabs (Diazepam) .... Take 1 Tablet By Mouth Once A Day 14)  Zetia 10 Mg Tabs (Ezetimibe)  Allergies: 1)  ! Penicillin 2)  Pravachol 3)  Crestor 4)  Lipitor 5)  Niaspan (Niacin (Antihyperlipidemic)) 6)  Amoxicillin (Amoxicillin) 7)  Amoxicillin (Amoxicillin)  Past History:  Past Medical History: Coronary artery disease Dr Andee Lineman status post coronary bypass grafting 2005 status post cardiac catheterization October 2007 with patent graft anatomy atretic left internal mammary artery to the LAD which is nonobstructive. Will restart study June 08, 2007 GERD Hyperlipidemia Hypertension Benign prostatic hypertrophy, elev PSA Dr Lindell Noe Bx 2010  Physical Exam  General:  Well developed, well nourished, in no acute distress. Nose:  External nasal examination shows no deformity or inflammation. Nasal mucosa are pink and moist without lesions or exudates. Mouth:  Teeth, gums and palate normal. Oral mucosa normal. Neck:  Neck supple, no JVD. No masses, thyromegaly or abnormal cervical nodes. Lungs:  Clear bilaterally to auscultation and percussion. Heart:  Non-displaced PMI, chest non-tender; regular rate and rhythm, S1, S2 without murmurs, rubs or gallops. Carotid upstroke normal, no bruit. Normal abdominal aortic size, no bruits. Femorals normal pulses, no bruits. Pedals normal pulses. No edema, no varicosities. Abdomen:  Bowel sounds positive; abdomen soft and non-tender without masses, organomegaly, or hernias noted. No hepatosplenomegaly. Msk:  Back normal, normal gait. Muscle strength and tone normal. Extremities:  No clubbing or cyanosis. Neurologic:  Alert and oriented x 3. Skin:  Intact without lesions or rashes. Inguinal Nodes:  No significant adenopathy Psych:  Normal affect.   Impression & Recommendations:  Problem # 1:  CORONARY ARTERY DISEASE (ICD-414.00) Assessment Unchanged  His updated medication list for this problem includes:    Atenolol 50 Mg Tabs (Atenolol) ..... Once daily 1 by mouth    Plavix 75 Mg Tabs (Clopidogrel bisulfate) .[name]... Take 1 tablet by mouth  once a day    Nitrostat 0.4 Mg Subl (Nitroglycerin) .[name]... 1 sl as needed    Aspir-low 81 Mg Tbec (Aspirin) .[name]... 1 once daily pc  Problem # 2:  HYPERLIPIDEMIA (ICD-272.4) Assessment: Unchanged  The following medications were removed from the medication list:    Niaspan 500 Mg Cr-tabs (Niacin (antihyperlipidemic)) .[name]... Take 1 tablet by mouth at bedtime His updated medication list for this problem includes:    Lipitor 40 Mg Tabs (Atorvastatin calcium)  .[name]... 1 once daily    Zetia 10 Mg Tabs (Ezetimibe)  Problem # 3:  HYPERTENSION (ICD-401.9) Assessment: Unchanged  His updated medication list for this problem includes:    Atenolol 50 Mg Tabs (Atenolol) ..... Once daily 1 by mouth  Problem # 4:  GERD (ICD-530.81) Assessment: Comment Only  His updated medication list for this problem includes:    Axid 150 Mg Caps (Nizatidine) .[name]... 1 by mouth once daily prn  Complete Medication List: 1)  Atenolol 50 Mg Tabs (Atenolol) .... Once daily 1 by mouth 2)  Plavix 75 Mg Tabs (Clopidogrel bisulfate) .... Take 1 tablet by mouth once a day 3)  Flomax 0.4 Mg Cp24 (Tamsulosin hcl) .[name].. 1 qd 4)  Axid 150 Mg Caps (Nizatidine) .[name].. 1 by mouth once daily prn 5)  Vitamin D3 1000 Unit Tabs (Cholecalciferol) .[name].. 1 qd 6)  Zolpidem Tartrate 10 Mg Tabs (Zolpidem tartrate) .... 1/2 or 1 by mouth at hs prn 7)  Lipitor 40 Mg Tabs (Atorvastatin calcium) .[name].. 1 once daily 8)  Nitrostat 0.4 Mg Subl (Nitroglycerin) .[name].. 1 sl as needed 9)  Aspir-low 81 Mg Tbec (Aspirin) .[name].. 1 once daily pc 10)  Fish Oil Oil (Fish oil) .... Qid 11)  Vitamin D3 1000 Unit Tabs (Cholecalciferol) .[name].. 1 by mouth daily 12)  Valium 5 Mg Tabs (Diazepam) .... Take 1 tablet by mouth once a day 13)  Zetia 10 Mg Tabs (Ezetimibe)  Patient Instructions: 1)  Please schedule a follow-up appointment in 12 months. 2)  CK 3)  BMP prior to visit, ICD-9: 4)  Hepatic Panel prior to visit, ICD-9: 5)  Lipid Panel prior to visit, ICD-9: 6)  TSH prior to visit, ICD-9: Prescriptions: ZOLPIDEM TARTRATE 10 MG  TABS (ZOLPIDEM TARTRATE) 1/2 or 1 by mouth at hs prn  #30 x 6   Entered and Authorized by:   Tresa Garter MD   Signed by:   Tresa Garter MD on 12/11/2009   Method used:   Print then Give to Patient   RxID:   [id number] LIPITOR 40 MG TABS (ATORVASTATIN CALCIUM) 1 once daily  #90 x 3   Entered and Authorized by:   Tresa Garter MD   Signed by:   Tresa Garter MD  on 12/11/2009   Method used:   Print then Give to Patient   RxID:   [id number] PLAVIX 75 MG TABS (CLOPIDOGREL BISULFATE) Take 1 tablet by mouth once a day  #30 x 6   Entered and Authorized by:   Tresa Garter MD   Signed by:   Tresa Garter MD on 12/11/2009   Method used:   Print then Give to Patient   RxID:   [id number] NITROSTAT 0.4 MG SUBL (NITROGLYCERIN) 1 sl as needed  #20 x 3   Entered and Authorized by:   Tresa Garter MD   Signed by:   Tresa Garter MD on 12/11/2009   Method used:   Electronically to  [facility]  [address] (retail)       [address]       [place], [place]  [address]       Ph: [phone]       Fax: [phone]   RxID:   [id number] ATENOLOL 50 MG TABS (ATENOLOL) once daily 1 by mouth  #90 x 4   Entered and Authorized by:   Tresa Garter MD   Signed by:   Tresa Garter MD on 12/11/2009   Method used:   Electronically to        [facility]  [address] (retail)       [address]       [place], [place]  [address]       Ph: [phone]       Fax: [phone]   RxID:   [id number]    Influenza Vaccine (to be given today)   Appended Document: 6 mos f/u $50 cd-PER PT RS TO JAN--#--STC    Clinical Lists Changes  Orders: Added new Service order of Flu Vaccine 80yrs + (57[address]) - Signed Added new Service order of Administration Flu vaccine - MCR (G2542) - Signed Observations: Added new observation of FLU VAX VIS: 07/04/2009 version (12/11/2009 8:43) Added new observation of FLU VAXLOT: [id number] (12/11/2009 8:43) Added new observation of FLU VAXMFR: [employer] (12/11/2009 8:43) Added new observation of FLU VAX EXP: 05/24/2010 (12/11/2009 8:43) Added new observation of FLU VAX DSE: 0.60ml (12/11/2009 8:43) Added new observation of FLU VAX: Fluvax 3+ (12/11/2009 8:43)       Flu Vaccine Consent Questions     Do  you have a history of severe allergic reactions to this vaccine? no    Any prior history of allergic reactions to egg and/or gelatin? no    Do you have a sensitivity to the preservative Thimersol? no    Do you have a past history of Guillan-Barre Syndrome? no    Do you currently have an acute febrile illness? no    Have you ever had a severe reaction to latex? no    Vaccine information given and explained to patient? yes    Are you currently pregnant? no    Lot Number:[id number]   Exp Date:05/24/2010   Site Given  Left Deltoid IMedflu

## 2010-12-25 NOTE — Progress Notes (Signed)
Summary: Lab orders needed  Phone Note Call from Patient Call back at Home Phone [phone] Call back at Work Phone [phone]   Caller: pateint walk in Reason for Call: Talk to Nurse Summary of Call: patient has an upcoming appt 7/22 he said he will have upcoming labs due before then.  he would like to be able to do this in the [place] office Initial call taken by: Claudette Laws,  May 04, 2010 2:41 PM  Follow-up for Phone Call        Left message to call back on machine. Cyril Loosen, RN, BSN  May 04, 2010 4:37 PM Pt states he always has lab work done before his appts. He is due to see Dr. Earnestine Leys on June 15, 2010. He had labs done by Dr. Posey Rea in January but states he does not want to come for appt unless he has lab work done first. Pt notified message will be sent to Dr. Earnestine Leys to find out what labs need to be ordered and he will be notified when nurse gets a response from Dr. Earnestine Leys.  Follow-up by: Cyril Loosen, RN, BSN,  May 08, 2010 9:20 AM  Additional Follow-up for Phone Call Additional follow up Details #1::        CBC, BMET, li[pid panel and LFT's fasting.  Lewayne Bunting, MD, [facility]  May 08, 2010 5:27 PM Pt notified and verbalized understanding. Lab ordered entered into [facility] for [facility] lab for 7/11 and EMR.  Additional Follow-up by: Cyril Loosen, RN, BSN,  May 09, 2010 10:22 AM

## 2010-12-25 NOTE — Assessment & Plan Note (Signed)
Vital Signs:  Patient Profile:   77 Years Old Male Weight:      158 pounds Pulse rate:   55 / minute BP sitting:   115 / 69                 Chief Complaint:  Multiple medical problems or concerns.  Current Allergies: ! PENICILLIN PRAVACHOL  Past Medical History:    Coronary artery disease    GERD    Hyperlipidemia    Hypertension    Benign prostatic hypertrophy  Past Surgical History:    stent     Review of Systems  The patient denies chest pain.     Physical Exam  General:     Well-developed,well-nourished,in no acute distress; alert,appropriate and cooperative throughout examination Head:     Normocephalic and atraumatic without obvious abnormalities. No apparent alopecia or balding. Eyes:     No corneal or conjunctival inflammation noted. EOMI. Perrla. Funduscopic exam benign, without hemorrhages, exudates or papilledema. Vision grossly normal. Nose:     External nasal examination shows no deformity or inflammation. Nasal mucosa are pink and moist without lesions or exudates. Mouth:     Oral mucosa and oropharynx without lesions or exudates.  Teeth in good repair. Lungs:     Normal respiratory effort, chest expands symmetrically. Lungs are clear to auscultation, no crackles or wheezes. Heart:     Normal rate and regular rhythm. S1 and S2 normal without gallop, murmur, click, rub or other extra sounds. Abdomen:     Bowel sounds positive,abdomen soft and non-tender without masses, organomegaly or hernias noted. Msk:     No deformity or scoliosis noted of thoracic or lumbar spine.   Extremities:     No clubbing, cyanosis, edema, or deformity noted with normal full range of motion of all joints.   Neurologic:     No cranial nerve deficits noted. Station and gait are normal. Plantar reflexes are down-going bilaterally. DTRs are symmetrical throughout. Sensory, motor and coordinative functions appear intact. Psych:     Cognition and judgment appear  intact. Alert and cooperative with normal attention span and concentration. No apparent delusions, illusions, hallucinations    Impression & Recommendations:  Problem # 1:  CORONARY ARTERY DISEASE (ICD-414.00) Assessment: Unchanged  His updated medication list for this problem includes:    Atenolol 50 Mg Tabs (Atenolol) ..... Qd    Plavix 75 Mg Tabs (Clopidogrel bisulfate) ..... Qd    Lisinopril 10 Mg Tabs (Lisinopril) ..... Once daily    Aspir-low 81 Mg Tbec (Aspirin) .[name]... 1 once daily pc   Problem # 2:  HYPERTENSION (ICD-401.9) Assessment: Unchanged  His updated medication list for this problem includes:    Atenolol 50 Mg Tabs (Atenolol) ..... Qd    Lisinopril 10 Mg Tabs (Lisinopril) ..... Once daily   Problem # 3:  HYPERLIPIDEMIA (ICD-272.4) Assessment: Unchanged  His updated medication list for this problem includes:    Lipitor 40 Mg Tabs (Atorvastatin calcium) .[name]... 1/2 qd   Problem # 5:  BENIGN PROSTATIC HYPERTROPHY (ICD-600.00) Assessment: Unchanged D/c uraxatrol Start Flomax 0.4mg  qd  Complete Medication List: 1)  Atenolol 50 Mg Tabs (Atenolol) .... Qd 2)  Plavix 75 Mg Tabs (Clopidogrel bisulfate) .... Qd 3)  Lisinopril 10 Mg Tabs (Lisinopril) .... Once daily 4)  Lipitor 40 Mg Tabs (Atorvastatin calcium) .... 1/2 qd 5)  Flomax 0.4 Mg Cp24 (Tamsulosin hcl) .[name].. 1 qd 6)  Aspir-low 81 Mg Tbec (Aspirin) .[name].. 1 once daily pc  Patient Instructions: 1)  Please schedule a follow-up appointment in 3 months. 2)  Lipid Panel prior to visit, ICD-9:    Prescriptions: LISINOPRIL 10 MG  TABS (LISINOPRIL) once daily  #90 x 3   Entered and Authorized by:   Tresa Garter MD   Signed by:   Tresa Garter MD on 09/01/2007   Method used:   Print then Give to Patient   RxID:   [id number]  ]

## 2010-12-25 NOTE — Letter (Signed)
Summary: Engineer, materials at [facility]  [address]   [place], [place] [address]   Phone: [phone]  Fax: [phone]        June 08, 2010 MRN: [id number]   [name] Christensen [address] [place], [place]  [address]   Dear [name],  Your test ordered by Selena Batten has been reviewed by your physician (or physician assistant) and was found to be normal or stable. Your physician (or physician assistant) felt no changes were needed at this time.  ____ Echocardiogram  ____ Cardiac Stress Test  __X__ Lab Work - lipids at goal  ____ Peripheral vascular study of arms, legs or neck  ____ CT scan or X-ray  ____ Lung or Breathing test  ____ Other:   Thank you.   Hoover Brunette, LPN    Duane Boston, M.D., F.A.C.C. Thressa Sheller, M.D., F.A.C.C. Oneal Grout, M.D., F.A.C.C. Cheree Ditto, M.D., F.A.C.C. Daiva Nakayama, M.D., F.A.C.C. Kenney Houseman, M.D., F.A.C.C. Jeanne Ivan, PA-C

## 2010-12-25 NOTE — Letter (Signed)
Summary: Letter/ [facility] [employer] [employer]  Letter/ [facility] [facility]   Imported By: Dorise Hiss 10/12/2009 10:08:19  _____________________________________________________________________  External Attachment:    Type:   Image     Comment:   External Document

## 2010-12-26 ENCOUNTER — Ambulatory Visit (INDEPENDENT_AMBULATORY_CARE_PROVIDER_SITE_OTHER): Payer: Medicare Other | Admitting: Internal Medicine

## 2010-12-26 ENCOUNTER — Encounter: Payer: Self-pay | Admitting: Internal Medicine

## 2010-12-26 DIAGNOSIS — J069 Acute upper respiratory infection, unspecified: Secondary | ICD-10-CM

## 2010-12-27 NOTE — Miscellaneous (Signed)
Summary: Orders Update  Clinical Lists Changes  Problems: Added new problem of CAROTID BRUIT (ICD-785.9) Orders: Added new Test order of Carotid Duplex (Carotid Duplex) - Signed 

## 2010-12-27 NOTE — Assessment & Plan Note (Signed)
Summary: YEARLY FU/ NWS  #   Vital Signs:  Patient profile:   77 year old male Height:      68 inches Weight:      157 pounds BMI:     23.96 Temp:     97.6 degrees F oral Pulse rate:   72 / minute Pulse rhythm:   irregular Resp:     16 per minute BP sitting:   148 / 70  (left arm) Cuff size:   large  Vitals Entered By: Lanier Prude, CMA(AAMA) (December 12, 2010 9:32 AM) CC: 1 yr f/u  Is Patient Diabetic? No Comments pt is not taking Flomax or Axid   Primary Care Provider:  Tresa Garter MD  CC:  1 yr f/u .  History of Present Illness: The patient presents for a follow up of hypertension, CAD, hyperlipidemia  The patient presents for a preventive health examination  Patient past medical history, social history, and family history reviewed in detail no significant changes.  Patient is physically active. Depression is negative and mood is good. Hearing is normal, and able to perform activities of daily living. Risk of falling is negligible and home safety has been reviewed and is appropriate. Patient has normal height, weight, and visual acuity is nl w/glasses. Patient has been counseled on age-appropriate routine health concerns for screening and prevention. Education, counseling done. Cognition nl  Preventive Screening-Counseling & Management  Alcohol-Tobacco     Alcohol drinks/day: 0     Smoking Status: never  Caffeine-Diet-Exercise     Caffeine Counseling: not indicated; caffeine use is not excessive or problematic     Diet Counseling: not indicated; diet is assessed to be healthy     Does Patient Exercise: yes     Type of exercise: gym     Times/week: 6     Depression Counseling: not indicated; screening negative for depression  Hep-HIV-STD-Contraception     Hepatitis Risk: no risk noted     Sun Exposure-Excessive: no  Safety-Violence-Falls     Seat Belt Use: yes     Fall Risk Counseling: not indicated; no significant falls noted      Sexual History:   currently monogamous.    Current Medications (verified): 1)  Atenolol 50 Mg Tabs (Atenolol) .... Once Daily 1 By Mouth 2)  Plavix 75 Mg Tabs (Clopidogrel Bisulfate) .... Take 1 Tablet By Mouth Once A Day 3)  Flomax 0.4 Mg Caps (Tamsulosin Hcl) .... Take 1 Tablet By Mouth Once A Day Hold When Taking Uroxatral 4)  Axid Ar 75 Mg Tabs (Nizatidine) .... Take 1 Tablet By Mouth Once A Day As Needed 5)  Zolpidem Tartrate 10 Mg  Tabs (Zolpidem Tartrate) .... 1/2 or 1 By Mouth At [facility] As Needed 6)  Lipitor 40 Mg Tabs (Atorvastatin Calcium) .[name].. 1 Once Daily 7)  Nitrostat 0.4 Mg Subl (Nitroglycerin) .[name].. 1 Sl As Needed 8)  Aspir-Low 81 Mg Tbec (Aspirin) .[name].. 1 Once Daily Pc 9)  Fish Oil 1000 Mg Caps (Omega-3 Fatty Acids) .... Take 2 Tablet By Mouth Two Times A Day 10)  Vitamin D3 1000 Unit  Tabs (Cholecalciferol) .[name].. 1 By Mouth Daily 11)  Valium 5 Mg Tabs (Diazepam) .... Take 1 Tablet By Mouth Once A Day As Needed 12)  Zetia 10 Mg Tabs (Ezetimibe) .... Take 1 Tablet By Mouth Once A Day (Only Uses When Not On Lipitor) 13)  Uroxatral 10 Mg Xr24h-Tab (Alfuzosin Hcl) .... Take 1 Tablet By Mouth Once A Day Hold  When Taking Flomax 14)  Multivitamins  Tabs (Multiple Vitamin) .... Take 1 Tablet By Mouth Once A Day 15)  Ranitidine Hcl 150 Mg Tabs (Ranitidine Hcl) .... 1/2 Once Daily As Needed  Allergies (verified): 1)  ! Penicillin 2)  Pravachol 3)  Crestor 4)  Lipitor 5)  Niaspan (Niacin (Antihyperlipidemic)) 6)  Amoxicillin (Amoxicillin) 7)  Amoxicillin (Amoxicillin) 8)  Zetia  Past History:  Past Medical History: Last updated: 12/11/2009 Coronary artery disease Dr Andee Lineman status post coronary bypass grafting 2005 status post cardiac catheterization October 2007 with patent graft anatomy atretic left internal mammary artery to the LAD which is nonobstructive. Will restart study June 08, 2007 GERD Hyperlipidemia Hypertension Benign prostatic hypertrophy, elev PSA Dr Lindell Noe Bx 2010  Family  History: Last updated: 09/09/2007 Family History of CAD Male 1st degree relative <60  Past Surgical History: stent Lumbar fusion 09/2007 Cardiac Catheterization CABG Colon 2003 nl Dr Marina Goodell  Social History: Married Never Smoked Regular exercise-yes Occupation: Veterinary surgeon Alcohol use-no Hepatitis Risk:  no risk noted Sun Exposure-Excessive:  no Risk analyst Use:  yes Sexual History:  currently monogamous  Review of Systems  The patient denies anorexia, fever, weight loss, weight gain, vision loss, decreased hearing, hoarseness, chest pain, syncope, dyspnea on exertion, peripheral edema, prolonged cough, headaches, hemoptysis, abdominal pain, melena, hematochezia, severe indigestion/heartburn, hematuria, incontinence, genital sores, muscle weakness, suspicious skin lesions, transient blindness, difficulty walking, depression, unusual weight change, abnormal bleeding, enlarged lymph nodes, angioedema, and testicular masses.         occasional palpitaions  Physical Exam  General:  Well developed, well nourished, in no acute distress. Eyes:  No corneal or conjunctival inflammation noted. EOMI. Perrla. Ears:  External ear exam shows no significant lesions or deformities.  Otoscopic examination reveals clear canals, tympanic membranes are intact bilaterally without bulging, retraction, inflammation or discharge. Hearing is grossly normal bilaterally. Nose:  External nasal examination shows no deformity or inflammation. Nasal mucosa are pink and moist without lesions or exudates. Mouth:  Teeth, gums and palate normal. Oral mucosa normal. Neck:  B bruit - mild Lungs:  Clear bilaterally to auscultation and percussion. Heart:  RRR Abdomen:  Bowel sounds positive; abdomen soft and non-tender without masses, organomegaly, or hernias noted. No hepatosplenomegaly. Rectal:  per Urol Prostate:  per Urol Msk:  Back normal, normal gait. Muscle strength and tone normal. Pulses:  R and L  carotid,radial,femoral,dorsalis pedis and posterior tibial pulses are full and equal bilaterally Extremities:  No clubbing, cyanosis, edema, or deformity noted with normal full range of motion of all joints.   Neurologic:  No cranial nerve deficits noted. Station and gait are normal. Plantar reflexes are down-going bilaterally. DTRs are symmetrical throughout. Sensory, motor and coordinative functions appear intact. Skin:  Intact without suspicious lesions or rashes Cervical Nodes:  No lymphadenopathy noted Inguinal Nodes:  No significant adenopathy Psych:  Cognition and judgment appear intact. Alert and cooperative with normal attention span and concentration. No apparent delusions, illusions, hallucinations   Impression & Recommendations:  Problem # 1:  CORONARY ARTERY BYPASS GRAFT, HX OF (ICD-V45.81) Assessment Unchanged  His updated medication list for this problem includes:    Atenolol 50 Mg Tabs (Atenolol) ..... Once daily 1 by mouth    Plavix 75 Mg Tabs (Clopidogrel bisulfate) .[name]... Take 1 tablet by mouth once a day    Nitrostat 0.4 Mg Subl (Nitroglycerin) .[name]... 1 sl as needed    Aspir-low 81 Mg Tbec (Aspirin) .[name]... 1 once daily pc  Problem # 2:  HYPERTENSION (ICD-401.9) Assessment: Unchanged  His updated medication list for this problem includes:    Atenolol 50 Mg Tabs (Atenolol) ..... Once daily 1 by mouth  Problem # 3:  HYPERLIPIDEMIA (ICD-272.4) Assessment: Unchanged  The following medications were removed from the medication list:    Zetia 10 Mg Tabs (Ezetimibe) .[name]... Take 1 tablet by mouth once a day (only uses when not on lipitor) His updated medication list for this problem includes:    Lipitor 40 Mg Tabs (Atorvastatin calcium) .[name]... 1 once daily  Problem # 4:  MUSCLE PAIN (ICD-729.1) Assessment: Improved  His updated medication list for this problem includes:    Aspir-low 81 Mg Tbec (Aspirin) .[name]... 1 once daily pc  Problem # 5:  Preventive Health Care  (ICD-V70.0) Assessment: New Overall doing well, age appropriate education and counseling updated and referral for appropriate preventive services done unless declined, immunizations up to date or declined, diet counseling done if overweight, urged to quit smoking if smokes, most recent labs reviewed and current ordered if appropriate, ecg reviewed or declined (interpretation per ECG scanned in the EMR if done); information regarding Medicare Preventation requirements given if appropriate.  I have personally reviewed the Medicare Annual Wellness questionnaire and have noted 1.   The patient's medical and social history 2.   Their use of alcohol, tobacco or illicit drugs 3.   Their current medications and supplements 4.   The patient's functional ability including ADL's, fall risks, home safety risks and hearing or visual             impairment. 5.   Diet and physical activities 6.   Evidence for depression or mood disorders The patients weight, height, BMI and visual acuity have been recorded in the chart I have made referrals, counseling and provided education to the patient based review of the above and I have provided the pt with a written personalized care plan for preventive services.  Problem # 6:  BENIGN PROSTATIC HYPERTROPHY (ICD-600.00) Assessment: Unchanged Per Urol The following medications were removed from the medication list:    Flomax 0.4 Mg Caps (Tamsulosin hcl) .[name]... Take 1 tablet by mouth once a day hold when taking uroxatral His updated medication list for this problem includes:    Uroxatral 10 Mg Xr24h-tab (Alfuzosin hcl) .[name]... Take 1 tablet by mouth once a day hold when taking flomax  Problem # 7:  CAROTID BRUIT (ICD-785.9) Assessment: New  Orders: Doppler Referral (Doppler)  Problem # 8:  PALPITATIONS (ICD-785.1) Assessment: Deteriorated Discussed. EKG from the past reviewed w/him - premature beats. Today he has RRR. Call if worse. Card f/up His updated medication  list for this problem includes:    Atenolol 50 Mg Tabs (Atenolol) ..... Once daily 1 by mouth  Complete Medication List: 1)  Atenolol 50 Mg Tabs (Atenolol) .... Once daily 1 by mouth 2)  Plavix 75 Mg Tabs (Clopidogrel bisulfate) .... Take 1 tablet by mouth once a day 3)  Zolpidem Tartrate 10 Mg Tabs (Zolpidem tartrate) .... 1/2 or 1 by mouth at hs as needed 4)  Lipitor 40 Mg Tabs (Atorvastatin calcium) .[name].. 1 once daily 5)  Nitrostat 0.4 Mg Subl (Nitroglycerin) .[name].. 1 sl as needed 6)  Aspir-low 81 Mg Tbec (Aspirin) .[name].. 1 once daily pc 7)  Fish Oil 1000 Mg Caps (Omega-3 fatty acids) .... Take 2 tablet by mouth two times a day 8)  Vitamin D3 1000 Unit Tabs (Cholecalciferol) .[name].. 1 by mouth daily 9)  Valium 5 Mg Tabs (  Diazepam) .... Take 1 tablet by mouth once a day as needed 10)  Uroxatral 10 Mg Xr24h-tab (Alfuzosin hcl) .... Take 1 tablet by mouth once a day hold when taking flomax 11)  Multivitamins Tabs (Multiple vitamin) .... Take 1 tablet by mouth once a day 12)  Ranitidine Hcl 150 Mg Tabs (Ranitidine hcl) .[name].. 1 by mouth one-two times a day for indigestion  Other Orders: Medicare -1st Annual Wellness Visit [phone])  Patient Instructions: 1)  Please schedule a follow-up appointment in 12 months well w/labs. Prescriptions: LIPITOR 40 MG TABS (ATORVASTATIN CALCIUM) 1 once daily  #30 x 11   Entered and Authorized by:   Tresa Garter MD   Signed by:   Tresa Garter MD on 12/12/2010   Method used:   Print then Give to Patient   RxID:   [id number]    Orders Added: 1)  Medicare -1st Annual Wellness Visit [G0438] 2)  Doppler Referral [Doppler] 3)  Est. Patient Level IV [10272]   Immunization History:  Pneumovax Immunization History:    Pneumovax:  historical (09/08/2007)  Tetanus/Td Immunization History:    Tetanus/Td:  historical (09/09/2001)   Immunization History:  Pneumovax Immunization History:    Pneumovax:  Historical (09/08/2007)  Tetanus/Td  Immunization History:    Tetanus/Td:  Historical (09/09/2001)

## 2010-12-31 ENCOUNTER — Ambulatory Visit (INDEPENDENT_AMBULATORY_CARE_PROVIDER_SITE_OTHER): Payer: Medicare Other | Admitting: Internal Medicine

## 2010-12-31 ENCOUNTER — Ambulatory Visit (INDEPENDENT_AMBULATORY_CARE_PROVIDER_SITE_OTHER)
Admission: RE | Admit: 2010-12-31 | Discharge: 2010-12-31 | Disposition: A | Discharge status: Home / Self Care | Payer: Medicare Other | Source: Ambulatory Visit | Attending: Internal Medicine | Admitting: Internal Medicine

## 2010-12-31 ENCOUNTER — Encounter: Payer: Self-pay | Admitting: Internal Medicine

## 2010-12-31 ENCOUNTER — Other Ambulatory Visit: Payer: Self-pay | Admitting: Internal Medicine

## 2010-12-31 DIAGNOSIS — J069 Acute upper respiratory infection, unspecified: Secondary | ICD-10-CM

## 2011-01-02 NOTE — Assessment & Plan Note (Signed)
Summary: upper respiratory issues/plotnikovlb   Vital Signs:  Patient profile:   77 year old male Height:      68 inches Weight:      157 pounds BMI:     23.96 O2 Sat:      96 % on Room air Temp:     98.1 degrees F oral Pulse rate:   65 / minute BP sitting:   128 / 58  (left arm) Cuff size:   large  Vitals Entered By: Ami Bullins CMA (December 26, 2010 9:59 AM)  O2 Flow:  Room air CC: pt here with c/o head congesetion, sore throat, and cough x 3 days/ ab   Primary Care Provider:  Tresa Garter MD  CC:  pt here with c/o head congesetion, sore throat, and and cough x 3 days/ ab.  History of Present Illness: [name] Christensen presents with a 3 day h/o URI symptoms: no fever, has had HA, productive cough, feels rotten, sinus drainage. Denies SOB, no chest pain, N/V/D.  Current Medications (verified): 1)  Atenolol 50 Mg Tabs (Atenolol) .... Once Daily 1 By Mouth 2)  Plavix 75 Mg Tabs (Clopidogrel Bisulfate) .... Take 1 Tablet By Mouth Once A Day 3)  Zolpidem Tartrate 10 Mg  Tabs (Zolpidem Tartrate) .... 1/2 or 1 By Mouth At [facility] As Needed 4)  Lipitor 40 Mg Tabs (Atorvastatin Calcium) .[name].. 1 Once Daily 5)  Nitrostat 0.4 Mg Subl (Nitroglycerin) .[name].. 1 Sl As Needed 6)  Aspir-Low 81 Mg Tbec (Aspirin) .[name].. 1 Once Daily Pc 7)  Fish Oil 1000 Mg Caps (Omega-3 Fatty Acids) .... Take 2 Tablet By Mouth Two Times A Day 8)  Vitamin D3 1000 Unit  Tabs (Cholecalciferol) .[name].. 1 By Mouth Daily 9)  Valium 5 Mg Tabs (Diazepam) .... Take 1 Tablet By Mouth Once A Day As Needed 10)  Uroxatral 10 Mg Xr24h-Tab (Alfuzosin Hcl) .... Take 1 Tablet By Mouth Once A Day Hold When Taking Flomax 11)  Multivitamins  Tabs (Multiple Vitamin) .... Take 1 Tablet By Mouth Once A Day 12)  Ranitidine Hcl 150 Mg Tabs (Ranitidine Hcl) .... 1/2 By Mouth One-Two Times A Day For Indigestion  Allergies (verified): 1)  ! Penicillin 2)  Pravachol 3)  Crestor 4)  Lipitor 5)  Niaspan (Niacin (Antihyperlipidemic)) 6)   Amoxicillin (Amoxicillin) 7)  Amoxicillin (Amoxicillin) 8)  Zetia  Past History:  Past Medical History: Last updated: 12/11/2009 Coronary artery disease Dr Andee Lineman status post coronary bypass grafting 2005 status post cardiac catheterization October 2007 with patent graft anatomy atretic left internal mammary artery to the LAD which is nonobstructive. Will restart study June 08, 2007 GERD Hyperlipidemia Hypertension Benign prostatic hypertrophy, elev PSA Dr Lindell Noe Bx 2010  Past Surgical History: Last updated: 12/12/2010 stent Lumbar fusion 09/2007 Cardiac Catheterization CABG Colon 2003 nl Dr Marina Goodell  Family History: Last updated: 09/09/2007 Family History of CAD Male 1st degree relative <60  Social History: Last updated: 12/12/2010 Married Never Smoked Regular exercise-yes Occupation: Veterinary surgeon Alcohol use-no  Review of Systems  The patient denies anorexia, fever, weight loss, weight gain, decreased hearing, hoarseness, chest pain, dyspnea on exertion, abdominal pain, severe indigestion/heartburn, suspicious skin lesions, depression, unusual weight change, and angioedema.    Physical Exam  General:  alert, well-developed, well-nourished, and normal appearance.   Head:  no tenderness to percussion over the sinuses Eyes:  vision grossly intact, pupils equal, pupils round, and corneas and lenses clear.   Ears:  R ear normal and L ear  normal.   Nose:  no external deformity and no external erythema.   Mouth:  posterior pharynx clear Neck:  supple, full ROM, and no thyromegaly.   Chest Wall:  no deformities.   Lungs:  normal respiratory effort and normal breath sounds.   Heart:  normal rate and regular rhythm.   Abdomen:  soft, non-tender, and normal bowel sounds.   Msk:  normal ROM, no redness over joints, and no joint deformities.   Pulses:  2+ radial  Neurologic:  alert & oriented X3, sensation intact to light touch, gait normal, and DTRs symmetrical and normal.    Skin:  turgor normal, color normal, no ecchymoses, and no petechiae.   Cervical Nodes:  no anterior cervical adenopathy and no posterior cervical adenopathy.   Psych:  Oriented X3, memory intact for recent and remote, normally interactive, and good eye contact.     Impression & Recommendations:  Problem # 1:  URI (ICD-465.9) Patient with URI that has persisted for several days. No fever.   Plan - Z-pak as directed.           supportive care - see pt instr.   His updated medication list for this problem includes:    Aspir-low 81 Mg Tbec (Aspirin) .[name]... 1 once daily pc  Complete Medication List: 1)  Atenolol 50 Mg Tabs (Atenolol) .... Once daily 1 by mouth 2)  Plavix 75 Mg Tabs (Clopidogrel bisulfate) .... Take 1 tablet by mouth once a day 3)  Zolpidem Tartrate 10 Mg Tabs (Zolpidem tartrate) .... 1/2 or 1 by mouth at hs as needed 4)  Lipitor 40 Mg Tabs (Atorvastatin calcium) .[name].. 1 once daily 5)  Nitrostat 0.4 Mg Subl (Nitroglycerin) .[name].. 1 sl as needed 6)  Aspir-low 81 Mg Tbec (Aspirin) .[name].. 1 once daily pc 7)  Fish Oil 1000 Mg Caps (Omega-3 fatty acids) .... Take 2 tablet by mouth two times a day 8)  Vitamin D3 1000 Unit Tabs (Cholecalciferol) .[name].. 1 by mouth daily 9)  Valium 5 Mg Tabs (Diazepam) .... Take 1 tablet by mouth once a day as needed 10)  Uroxatral 10 Mg Xr24h-tab (Alfuzosin hcl) .... Take 1 tablet by mouth once a day hold when taking flomax 11)  Multivitamins Tabs (Multiple vitamin) .... Take 1 tablet by mouth once a day 12)  Ranitidine Hcl 150 Mg Tabs (Ranitidine hcl) .... 1/2 by mouth one-two times a day for indigestion 13)  Zithromax Z-pak 250 Mg Tabs (Azithromycin) .... As directed  Patient Instructions: 1)  Upper respiratory infection - plan: take z-pak as directed; lots of fluids; tylenol for fever and aches; Vitamin C 1500mg  daily. Take a day or two of rest. [place] cough syrup as needed. For sinus pressure try generic sudafed (behind the counter) 30mg  two or three  times a day.  Prescriptions: ZOLPIDEM TARTRATE 10 MG  TABS (ZOLPIDEM TARTRATE) 1/2 or 1 by mouth at hs as needed  #30 x 3   Entered and Authorized by:   Jacques Navy MD   Signed by:   Jacques Navy MD on 12/26/2010   Method used:   Print then Give to Patient   RxID:   [id number] 250 MG TABS (AZITHROMYCIN) as directed  #6 x 0   Entered and Authorized by:   Jacques Navy MD   Signed by:   Jacques Navy MD on 12/26/2010   Method used:   Electronically to        [facility]  [address] (retail)       [address] [place], [place]  [address]       Ph: [phone]       Fax: [phone]   RxID:   [id number]    Orders Added: 1)  Est. Patient Level III [51884]

## 2011-01-10 NOTE — Assessment & Plan Note (Signed)
Summary: head congestion,nausea,coughing/plot/lb   Vital Signs:  Patient profile:   77 year old male Height:      68 inches Weight:      155 pounds BMI:     23.65 O2 Sat:      96 % on Room air Temp:     98.1 degrees F oral Pulse rate:   74 / minute BP sitting:   128 / 60  (left arm) Cuff size:   regular  Vitals Entered By: Bill Salinas CMA (December 31, 2010 9:06 AM)  O2 Flow:  Room air CC: pt here with c/o head congestion, coughing, chills, runny nose and body aches. Pt was given zpak last week and hasnt finished but sees no improvement in symptoms/ ab   Primary Care Provider:  Tresa Garter MD  CC:  pt here with c/o head congestion, coughing, chills, and runny nose and body aches. Pt was given zpak last week and hasnt finished but sees no improvement in symptoms/ ab.  History of Present Illness: Patient was seen last Wednsday, February 1, for URI and was given a z-pak. His symptoms have progressed increased cough, productive of scant sputum of a yellow natured,  no fever at home, feels congested in his chest, myalgias and arthralgias. No N/V/D. Upper airway rattle and wheeze.   Current Medications (verified): 1)  Atenolol 50 Mg Tabs (Atenolol) .... Once Daily 1 By Mouth 2)  Plavix 75 Mg Tabs (Clopidogrel Bisulfate) .... Take 1 Tablet By Mouth Once A Day 3)  Zolpidem Tartrate 10 Mg  Tabs (Zolpidem Tartrate) .... 1/2 or 1 By Mouth At [facility] As Needed 4)  Lipitor 40 Mg Tabs (Atorvastatin Calcium) .[name].. 1 Once Daily 5)  Nitrostat 0.4 Mg Subl (Nitroglycerin) .[name].. 1 Sl As Needed 6)  Aspir-Low 81 Mg Tbec (Aspirin) .[name].. 1 Once Daily Pc 7)  Fish Oil 1000 Mg Caps (Omega-3 Fatty Acids) .... Take 2 Tablet By Mouth Two Times A Day 8)  Vitamin D3 1000 Unit  Tabs (Cholecalciferol) .[name].. 1 By Mouth Daily 9)  Valium 5 Mg Tabs (Diazepam) .... Take 1 Tablet By Mouth Once A Day As Needed 10)  Uroxatral 10 Mg Xr24h-Tab (Alfuzosin Hcl) .... Take 1 Tablet By Mouth Once A Day Hold When Taking  Flomax 11)  Multivitamins  Tabs (Multiple Vitamin) .... Take 1 Tablet By Mouth Once A Day 12)  Ranitidine Hcl 150 Mg Tabs (Ranitidine Hcl) .... 1/2 By Mouth One-Two Times A Day For Indigestion 13)  Zithromax Z-Pak 250 Mg Tabs (Azithromycin) .... As Directed  Allergies (verified): 1)  ! Penicillin 2)  Pravachol 3)  Crestor 4)  Lipitor 5)  Niaspan (Niacin (Antihyperlipidemic)) 6)  Amoxicillin (Amoxicillin) 7)  Amoxicillin (Amoxicillin) 8)  Zetia PMH-FH-SH reviewed-no changes except otherwise noted  Review of Systems  The patient denies anorexia, fever, weight loss, weight gain, decreased hearing, hoarseness, syncope, dyspnea on exertion, peripheral edema, headaches, abdominal pain, severe indigestion/heartburn, muscle weakness, difficulty walking, depression, and angioedema.    Physical Exam  General:  alert, well-developed, well-nourished, and normal appearance.   Head:  normocephalic and atraumatic.  tender to percussion over the maxillary sinus Eyes:  vision grossly intact, pupils round, and corneas and lenses clear.   Ears:  EAC wtih cerumen.left; TM's normal  Mouth:  posterior pharynx was clear Neck:  full ROM.   Chest Wall:  no deformities.   Lungs:  normal respiratory effort, normal breath sounds, no crackles, and no wheezes.   Heart:  normal rate and  regular rhythm.     Impression & Recommendations:  Problem # 1:  URI (ICD-465.9) [place] with continued symptoms with no evidence of active bacterial infection. He still has active azithromycin on board.   Plan - supportive care           chest x-ray.  His updated medication list for this problem includes:    Aspir-low 81 Mg Tbec (Aspirin) .[name]... 1 once daily pc    Promethazine-codeine 6.25-10 Mg/44ml Syrp (Promethazine-codeine) .[name]... 1 tsp q6 as needed , may take two tsp at bedtime.  Orders: T-2 View CXR (71020TC)   Clinical Data: Cough and hypertension.  Upper respiratory infection.   CHEST - 2 VIEW    Comparison: Single view chest x-ray 06/01/2007.   Findings: The heart size is at the upper limits of normal. Atherosclerotic calcifications are noted in the aorta.  The patient is status post median sternotomy for CABG.  The lungs are mildly hyperinflated with changes compatible with emphysema.  No focal airspace disease is evident.  The visualized soft tissues and bony thorax are unremarkable.   IMPRESSION:   1.  Mild emphysematous changes. 2.  Atherosclerosis. 3.  No acute cardiopulmonary disease.   Original Report Authenticated By: Jamesetta Orleans. MATTERN, M.D.  Complete Medication List: 1)  Atenolol 50 Mg Tabs (Atenolol) .... Once daily 1 by mouth 2)  Plavix 75 Mg Tabs (Clopidogrel bisulfate) .... Take 1 tablet by mouth once a day 3)  Zolpidem Tartrate 10 Mg Tabs (Zolpidem tartrate) .... 1/2 or 1 by mouth at hs as needed 4)  Lipitor 40 Mg Tabs (Atorvastatin calcium) .[name].. 1 once daily 5)  Nitrostat 0.4 Mg Subl (Nitroglycerin) .[name].. 1 sl as needed 6)  Aspir-low 81 Mg Tbec (Aspirin) .[name].. 1 once daily pc 7)  Fish Oil 1000 Mg Caps (Omega-3 fatty acids) .... Take 2 tablet by mouth two times a day 8)  Vitamin D3 1000 Unit Tabs (Cholecalciferol) .[name].. 1 by mouth daily 9)  Valium 5 Mg Tabs (Diazepam) .... Take 1 tablet by mouth once a day as needed 10)  Uroxatral 10 Mg Xr24h-tab (Alfuzosin hcl) .... Take 1 tablet by mouth once a day hold when taking flomax 11)  Multivitamins Tabs (Multiple vitamin) .... Take 1 tablet by mouth once a day 12)  Ranitidine Hcl 150 Mg Tabs (Ranitidine hcl) .... 1/2 by mouth one-two times a day for indigestion 13)  Zithromax Z-pak 250 Mg Tabs (Azithromycin) .... As directed 14)  Promethazine-codeine 6.25-10 Mg/44ml Syrp (Promethazine-codeine) .[name].. 1 tsp q6 as needed , may take two tsp at bedtime.  Patient Instructions: 1)  Upper respiratory infection - no signs of bacterial infection on exam today. Suspect a bad viral infection. Plan - azithromycin is still on  board through Feb 10th. For cough - any syrup of choice during the day, at night phenergan with codeine 1 or 2 teaspoons; mucinex or mucinex DM; pseudoephedrine 30mg  three times a day for congestion; hydrate; tylenol 1000mg  three times a day on schedule; for unrelieved pain take NSAID of choice, e.g. aleve. Will call for any abnormality on chest x-ray.  Prescriptions: PROMETHAZINE-CODEINE 6.25-10 MG/5ML SYRP (PROMETHAZINE-CODEINE) 1 tsp q6 as needed , may take two tsp at bedtime.  #8oz x 1   Entered and Authorized by:   Jacques Navy MD   Signed by:   Jacques Navy MD on 12/31/2010   Method used:   Handwritten   RxID:   [id number]    Orders Added: 1)  T-2 View CXR [  71020TC] 2)  Est. Patient Level III [04540]

## 2011-02-25 ENCOUNTER — Other Ambulatory Visit: Payer: Self-pay | Admitting: *Deleted

## 2011-02-25 NOTE — Telephone Encounter (Signed)
Pharm called, Pt needs rfs' of atenolol 50mg  1 qd #90 and lotrisone cream bid prn, 45G. To go to [facility].

## 2011-02-25 NOTE — Telephone Encounter (Signed)
Atenolol refilled but I did not show a past rx for lotrisone in EMR. Please advise for that medication. Thanks

## 2011-03-04 MED ORDER — ATENOLOL 50 MG PO TABS: 50.0000 mg | ORAL_TABLET | Freq: Every day | ORAL | Status: DC

## 2011-03-04 NOTE — Telephone Encounter (Signed)
OK to fill this prescription with additional refills x3 - both Thank you!

## 2011-04-09 NOTE — Discharge Summary (Signed)
NAMEBRAELYNN, [name] Christensen                ACCOUNT NO.:  [id number]   MEDICAL RECORD NO.:  [id number]          PATIENT TYPE:  OBV   LOCATION:  3701                         FACILITY:  [facility]   PHYSICIAN:  Jonelle Sidle, MD DATE OF BIRTH:  03/21/1932   DATE OF ADMISSION:  06/01/2007  DATE OF DISCHARGE:  06/02/2007                               DISCHARGE SUMMARY   PROCEDURE:  A 2-D echocardiogram.   PRIMARY DISCHARGE DIAGNOSES:  Chest pain, cardiac enzymes negative for  myocardial infarction and echocardiogram showing an ejection fraction  50%.   SECONDARY DIAGNOSES:  1. Status post aortocoronary bypass surgery in 2005 with left internal      mammary artery to left anterior descending, saphenous vein graft to      diagonal #1, saphenous vein graft to obtuse marginal #1 and obtuse      marginal #2, saphenous vein graft to right coronary artery.  2. Status post cardiac catheterization October 2007 with left internal      mammary artery to left anterior descending atretic, left anterior      descending 40%. All vein grafts patent. No critical distal disease.      Diagonal #1 80% (not bypassed), and medical therapy recommended  3. Mild left ventricular dysfunction with an ejection fraction of 50%      by echocardiogram this admission.  4. Hypertension.  5. Hyperlipidemia.  6. Family history of coronary artery disease.  7. Benign prostatic hypertrophy.  8. Gastroesophageal reflux disease.  9. Status post appendectomy, back surgery x2 and carotid      endarterectomy.  10.History of arthralgias with a STATINS.  11.Family history of coronary artery disease.   Time at discharge:  37 minutes.   HOSPITAL COURSE:  [name] is a 77 year old male with a history of  coronary artery disease.  He was fasting because of some lab work that  was to be done later, and was exercising on the treadmill when he had  onset of chest pain.  He took nitroglycerin and became very weak.  He  went home and  took his blood pressure, and he was hypotensive with a  systolic at times dropping into the 60s.  He came to the emergency room  and was admitted for further evaluation.   His chest pain resolved with the nitroglycerin.  His cardiac enzymes  were normal.  A lipid profile showed a total cholesterol of 115,  triglycerides 66, HDL of 42, LDL 60.  His other labs were within normal  limits except for a mild anemia with a hemoglobin of 12.9, hematocrit  38.2.  His initial BUN was 18 with creatinine 1.14.   He was hydrated because his systolic blood pressure was still a little  low when he got to the emergency room, and his repeat BUN and creatinine  was 15 and 1.02, respectively, with a GFR greater than 60.   A 2-D echocardiogram was performed which showed an EF of 50%, difficult  images, but there seemed to be some hypokinesis in the inferior and  posterior walls.   The chest x-ray was also  performed which showed low lung volumes with  slight under aeration at the bases and mild atelectasis but no acute  disease.   Orthostatic vital signs were checked after hydration, and blood pressure  went from 123/71 lying to 105/68 standing with a heart rate increasing  from 60-76.  He was asymptomatic with this.  He was seen by Dr.  Diona Browner, and he was considered stable to follow up with Dr. Andee Lineman with  an outpatient Myoview prior to followup.   DISCHARGE INSTRUCTIONS:  1. His activity level is to be increased gradually.  2. He is to stick to a low-fat diet.  3. He is to follow up with an outpatient stress test on July 14 at      9:00 a.m., and he is to follow up with Dr. Andee Lineman on July 15 at      3:15.   DISCHARGE MEDICATIONS:  1. Aspirin 81 mg daily.  2. Plavix 75 mg daily.  3. Atenolol 50 mg daily except the day of the stress test.  4. Lisinopril 10 mg daily  5. Zetia 10 mg a day.  6. Fish oil 1000 mg b.i.d.  7. Lipitor 40 mg 1/2 tablet daily.  8. Uroxatral 10 mg daily.  9. Axid  75  mg daily.   [name] has had arthralgias with STATINS.  He is encouraged to take  them as he will tolerate.  He has some Pravachol 40 mg daily at home but  was having issues with cough and was able to get samples of Lipitor 40  mg daily of which he is taking 1/2 tablet daily.      Theodore Demark, PA-C      Jonelle Sidle, MD  Electronically Signed    RB/MEDQ  D:  06/02/2007  T:  06/02/2007  Job:  [id number]

## 2011-04-09 NOTE — Assessment & Plan Note (Signed)
[facility]                          [facility] NOTE   IVA, POSTEN                       MRN:          [id number]  DATE:06/09/2007                            DOB:          24-Apr-1932    HISTORY OF PRESENT ILLNESS:  The patient is a 77 year old male with  history of coronary artery bypass grafting.  The patient had a  catheterization October 2007 which showed patent grafts.  The patient  was recently hospitalized on July 7 with chest tightness.  The patient  ruled out for myocardial infarction.  He underwent an outpatient  Cardiolite stress study which demonstrated good exercise tolerance and  an area of inferolateral scar versus soft tissue attenuation.  Dr. Tenny Craw  could not rule out a small amount of inferior ischemia.  The patient has  had no recurrent pain since July 7.  He has been exercising 6 times a  week without any difficulty, 1 hour each time.   The patient saw Dr. Posey Rea this morning and underwent physical  examination.  He also got some Levitra for erectile dysfunction.   MEDICATIONS:  1. Plavix 75 mg a day.  2. Atenolol 50 mg a day.  3. Lisinopril 10 mg a day.  4. Crestor 5 mg a day.  5. Zetia 10 mg a day.  6. Fish Oil.  7. Multivitamins.  8. Aspirin.  9. Uroxatral.   HISTORY AND PHYSICAL:  VITAL SIGNS: Blood pressure 114/62, heart rate  62.  Weight is 158 pounds.  NECK:  Normal carotid upstroke without carotid bruits.  LUNGS:  Clear.  HEART:  Regular rate and rhythm, normal S1 and S2.  ABDOMEN:  Soft.  EXTREMITIES:  No cyanosis, clubbing, or edema.   PROBLEM LIST:  1. Coronary artery disease status post coronary artery bypass grafting      in 2005.  2. Latest Cardiolite study June 08, 2007.  3. Status post catheterization October 2007 with patent vein graft and      atretic left internal mammary artery with a nonobstructive left      anterior descending.  4. Dyslipidemia, stable with LDL less than  100.  5. Hypertension, controlled; as a matter of fact, patient now      hypotensive.  Lisinopril discontinued.  6. Gastroesophageal reflux disease.   PLAN:  1. The patient will continue on his current medication regimen, but I      discontinued Lisinopril given his low blood pressure.  2. I suspect the patient's hypotension was secondary to dehydration,      exercise with vasal dilatation, and concomitant use of      nitroglycerin.  We reviewed his cardiac stress study.  Although      small area of inferior ischemia cannot be ruled out, I feel he is      low risk and does not warrant catheterization at this point in      time.  3. I cautioned the patient about taking nitroglycerin at the time of      use of Levitra and told him this was contraindicated.  4. The patient  will follow up with me in 6 months.     Learta Codding, MD,FACC  Electronically Signed    GED/MedQ  DD: 06/09/2007  DT: 06/10/2007  Job #: [id number]

## 2011-04-09 NOTE — Assessment & Plan Note (Signed)
[facility]                           PRIMARY CARE OFFICE NOTE   [name] Christensen, [name] Christensen                       MRN:          [id number]  DATE:06/09/2007                            DOB:          09-13-1932    The patient is a 77 year old male who presents with his wife for a  wellness examination.   PAST MEDICAL HISTORY:  As per June 05, 2006 notes. He was hospitalized  July 7 to June 02, 2007 for chest pain, unrelieved with nitroglycerine.   FAMILY HISTORY:  As per June 05, 2006 notes.   SOCIAL HISTORY:  As per June 05, 2006 note.   REVIEW OF SYSTEMS:  No chest pain or shortness of breath  since  discharge. He started back exercising. Erectile dysfunction problems,  ongoing. No blood in his stools. Rest of 18-point review of systems is  negative.   Muscle aches and arthralgias from Lipitor. Less with lower dose.   PHYSICAL EXAMINATION:  Blood pressure 103/60, pulse 62, temperature  97.2, weight 189 pounds. He is in no acute distress. He looks well.  HEENT: With moist mucosa. Neck is supple. No thyromegaly or bruit.  LUNGS:  Clear to auscultation and percussion. No wheezing or rales.  HEART: S1, S2. No murmur. No gallop.  ABDOMEN: Soft and nontender. No organomegaly or mass felt.  EXTREMITIES: Lower extremities without edema.  NEURO: He is alert and cooperative. Denies being depressed.  RECTAL: Reveals slightly asymmetric prostate with right lobe slightly  enlarged compared to the left one. Stool is guaiac negative. No masses.   LABORATORY DATA:  EKG: With sinus bradycardia and first-degree AV block  with right bundle branch block, unchanged from previous. Echocardiogram  with ejection fraction and Cardiolite stress test with serious scar.  Chest x-ray with basilar atelectasis. Lab work reviewed. Discharge  Summary reviewed.   ASSESSMENT/PLAN:  1. Normal wellness examination, age/health-related issues      discussed.Healthy lifestyle discussed.   Repeat examination in 12      months.  2. Episode of chest pain, unclear etiology. He has an appointment with      Dr.  Andee Lineman today. Will discuss.  3. Hypotension, on nitroglycerine. I would like to reduce the nitro      quick dose to 0.3 mg p.r.n.  4. Arthralgias with Lipitor. He will try to take it every other day.      If it does not work, he will try Crestor 5 mg every other day.      Repeat lab work in three months with CK, lipids and others.  5. Slightly enlarged right lobe of prostate. Will check PSA. Will      reexamine.  6. Seborrheic dermatitis of the face. Lotrisone cream prescribed.  7. Erectile dysfunction. Discussed the use of nitroglycerine. He may      have intercourse once or twice a year. I gave him samples of      Levitra 20 mg and he will discuss it with Dr.  Andee Lineman. He is aware      that he should not be using nitroglycerine any time  with or near      the time when he takes Levitra. Risks, side effects and benefits      all discussed.     [name] Quint. Plotnikov, MD  Electronically Signed    AVP/MedQ  DD: 06/09/2007  DT: 06/09/2007  Job #: [id number]   cc:   Learta Codding, MD,FACC

## 2011-04-09 NOTE — Assessment & Plan Note (Signed)
[facility]                          [facility] NOTE   [name] Christensen, [name] Christensen                       MRN:          [id number]  DATE:06/05/2009                            DOB:          September 05, 1932    REFERRING PHYSICIAN:  Georgina Quint. Plotnikov, MD   HISTORY OF PRESENT ILLNESS:  The patient is a 77 year old male with a  history of coronary artery disease status post coronary artery bypass  grafting in 2005.  The patient has been doing well.  He is very active.  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  He  still also works full-time.  He also works out 6 days a week in the gym  at 5 o'clock in the morning.  He experiences no chest pain, shortness of  breath, orthopnea or PND.  He does complain of some muscle pain and  proximal muscle weakness, which appears to be related to his statins.  If the patient stops the statins, he states things get better in the  next couple of days only to worsen again when he restarts.  Although, he  is somewhat hypertensive in the office.  His blood pressures at home are  always in the normal range and he has some element of white coat  hypertension.  The patient denies any palpitations or syncope.   MEDICATIONS:  1. Atenolol 50 mg p.o. daily.  2. Plavix 75 mg p.o. daily.  3. Lipitor 40 mg half tablet p.o. nightly.  4. Flomax 0.4 mg p.o. daily.  5. Aspirin 81 mg p.o. daily.  6. Fish oil 1000 mg 2 tablets p.o. b.i.d.  7. Multivitamin 1 tablet p.o. daily.   We reviewed the patient's blood work and his HDL is 51, LDL is 96.   PHYSICAL EXAMINATION:  VITAL SIGNS:  Blood pressure 161/84, but 126/72  at home with a heart of 56, weight is 158 pounds.  GENERAL:  A well-nourished white male in no apparent distress.  HEENT:  Pupils are isocoric.  Conjunctivae are clear.  NECK:  Supple.  Normal carotid upstroke.  No carotid bruits.  No  thyromegaly.  No nodular thyroid.  LUNGS:  Clear breath  sounds bilaterally.  HEART:  Regular rate and rhythm with normal S1 and S2.  No murmur, rubs  or gallops.  ABDOMEN:  Soft, nontender.  No rebound or guarding.  Good bowel sounds.  EXTREMITIES:  No cyanosis, clubbing or edema.   PROBLEM LIST:  1. Coronary artery disease status post coronary artery bypass      grafting, stable.  2. Muscle pain related to statin drug therapy.  3. Dyslipidemia, resolved.   PLAN:  1. The patient's EKG was reviewed and he has a right bundle-branch      block, which is old.  He has no conduction abnormalities.  He has      no syncope.  2. From a cardiovascular standpoint, the patient is safe for public      driving.  I filled in his paperwork.  3. The patient can follow up with [place] in 1  year.  4. I have given the patient a recommendation to use red yeast rice      instead of Lipitor due to his potent effect on his myalgias.     Learta Codding, MD,FACC  Electronically Signed    GED/MedQ  DD: 06/05/2009  DT: 06/05/2009  Job #: [id number]   cc:   Georgina Quint. Plotnikov, MD

## 2011-04-09 NOTE — Assessment & Plan Note (Signed)
[facility]                          [facility] NOTE   [name] Christensen, [name] Christensen                       MRN:          [id number]  DATE:12/17/2007                            DOB:          1932-05-30    REFERRING PHYSICIAN:  Georgina Quint. Plotnikov, MD   HISTORY OF PRESENT ILLNESS:  The patient is a 77 year old male with a  history of coronary artery bypass grafting.  The patient is status post  catheterization October 2007 that showed patent grafts.  Last year, the  patient underwent a Cardiolite stress study, which showed no definite  ischemia.  The patient has been doing well.  He reports no chest pain,  shortness of breath, orthopnea, or PND.  He did undergo a recent back  surgery.  He was briefly off Plavix, but has resumed this.  He denies  palpitations or syncope.  The patient had an EKG done in the office,  which showed a chronic right bundle branch block, which is unchanged  from previous.  The patient also had laboratory work done by Dr.  Posey Rea, which demonstrated a cholesterol of 145, triglycerides of 80,  and LDL cholesterol of 88 mg percent.  Lipid panel was otherwise within  normal limits, and also his liver panel was within normal limits.   MEDICATIONS:  1. Atenolol 50 mg p.o. daily.  2. Plavix 75 mg p.o. daily.  3. Lipitor 40 mg 1/2 tablet p.o. nightly.  4. Flomax 0.4 mg p.o. daily.  5. Aspirin 81 mg p.o. daily.  6. Vitamin D 1000 units nightly.   PHYSICAL EXAMINATION:  VITAL SIGNS:  Blood pressure 153/83, heart rate  is 61 beats per minute, weight 158 pounds.  NECK:  Normal carotid upstroke and no carotid bruits.  LUNGS:  Clear breath sounds bilaterally.  HEART:  Regular rate and rhythm with normal S1, S2.  No murmurs, rubs,  or gallops.  ABDOMEN:  Soft and nontender.  No rebound or guarding.  Good bowel  sounds.  EXTREMITIES:  No cyanosis, clubbing, or edema.  NEURO:  The patient is alert and oriented, and grossly  nonfocal.   PROBLEM LIST:  1. Coronary artery disease status post coronary artery bypass grafting      in 2005.  2. Low-risk Cardiolite study June 08, 2007.  3. Status post catheterization October 2007 with patent graft anatomy      and atretic left internal mammary artery to the left anterior      descending, which is not obstructive.  4. Dyslipidemia.  5. Hypertension, controlled off lisinopril.  6. Gastroesophageal reflux disease.   PLAN:  1. The patient is under a significant amount of stress.  We discussed      it in the office.  2. From a cardiovascular perspective, however, he is doing quite well      and he reports no substernal chest pain, shortness of breath,      orthopnea, or PND.  3. The patient will follow up with Dr. Posey Rea for his laboratory      work that can be reviewed in April.  4. The  patient also was given a prescription for refill on his valium.     Learta Codding, MD,FACC  Electronically Signed    GED/MedQ  DD: 12/17/2007  DT: 12/17/2007  Job #: [id number]   cc:   Georgina Quint. Plotnikov, MD

## 2011-04-09 NOTE — Assessment & Plan Note (Signed)
[facility]                            CARDIOLOGY OFFICE NOTE   [name] Christensen, [name] Christensen                       MRN:          [id number]  DATE:07/15/2008                            DOB:          Jan 06, 1932    [name] Christensen is a 77 year old patient of Dr. Andee Lineman.  He asked to see me  in Dr. Margarita Mail absence.  I believe I did a cath on him before.  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  The patient has  previous coronary disease with CABG.  He is not having chest pain.  He  had a diagnostic heart cath in August 2007, and had patent grafts with  good LV function.  He had a nonischemic Myoview study on June 08, 2007,  with an EF of 59%.   He has been doing fairly well.  He is not having any significant chest  pain, PND, or orthopnea.  He is active.  He has issues with statin  drugs.  Apparently, he has been tried on a myriad of statins and really  does not tolerate them.  He is able to take 20 of Lipitor for about 90  days and then he gets severe back pain and has to stop it for few weeks.  His last LDL cholesterol was 93.  He apparently was given simvastatin by  Dr.  Posey Rea, but only tolerated it for a week or two before he felt  totally nauseated and had muscle pains.   The patient also has a relatively low HDL.  After discussion with him, I  think what we will try to do is have him take 20 of Lipitor every other  day and add Niaspan 500 a day.  His LFTs were normal on recent lab work.   His review of systems is otherwise unremarkable.   His current medications include,  1. Atenolol 50.  2. Plavix 75 a day.  3. Aspirin a day.  4. Flomax 0.4 a day.  5. Fish oil.   He is allergic to PENICILLIN.   He has also intolerant to almost all STATINS.   Exam is remarkable for thin white male in no distress.  He looks younger  than his stated age.   PHYSICAL EXAMINATION:  GENERAL:  His weight is 152 pounds, blood  pressure is  140/80, pulse 58 and regular, respiratory 14, and afebrile.  HEENT:  Unremarkable.  NECK:  Carotids normal without bruit.  No lymphadenopathy, thyromegaly,  or JVP elevation.  LUNGS:  Clear.  Good diaphragmatic motion.  No wheezing.  HEART:  S1-S2 status post sternotomy.  PMI normal.  ABDOMEN:  Benign.  Bowel sounds positive.  No AAA, no tenderness, no  bruit, no hepatosplenomegaly, or hepatojugular reflux.  No tenderness.  EXTREMITIES:  Distal pulses were intact.  No edema.  NEURO:  Nonfocal.  SKIN:  Warm and dry.  MUSCULOSKELETAL:  No muscular weakness.   EKG shows sinus rhythm with a right bundle branch block.   IMPRESSION:  59. A 77 year old patient with previous coronary artery bypass  grafting.  Grafts patent by catheterization in 2007, nonischemic      Myoview in 2008, and no need for further testing.  Currently      continue atenolol, aspirin, and Plavix.  2. Hypercholesterolemia fairly well controlled, but not at target of      LDL less than 70.  Take Lipitor every other day.  Due to side      effects, add Niaspan 500 a day.  Followup with Dr. Andee Lineman in [place]      and consider increasing Niaspan to 1 g.  3. Hypertension currently well controlled.  Continue current dose of      atenolol.  4. Prostatism, continue Flomax, currently not having any presyncope.      Urinary stream good.  Followup with primary care MD for PSA.   I gave the patient plenty of options.  He lives in [place] and I  explained to him that Dr. Gala Romney and Dr. Sherlie Ban are going up there  and he is welcome to see them.  I think for the time being, he prefers  to go up to see Dr. Andee Lineman in [place] one more time.  After that, he will  decide where he wants to follow up with a [place] group.     Noralyn Pick. [name], MD, [facility]  Electronically Signed    PCN/MedQ  DD: 07/15/2008  DT: 07/16/2008  Job #: [id number]

## 2011-04-09 NOTE — Discharge Summary (Signed)
NAMEGEVON, [name]                ACCOUNT NO.:  [id number]   MEDICAL RECORD NO.:  [id number]          PATIENT TYPE:  EMS   LOCATION:  MAJO                         FACILITY:  [facility]   PHYSICIAN:  Noralyn Pick. [name], MD, FACCDATE OF BIRTH:  [birth year]   DATE OF ADMISSION:  06/01/2007  DATE OF DISCHARGE:                               DISCHARGE SUMMARY   PRIMARY CARE PHYSICIAN:  Dr. Posey Rea.   PRIMARY CARDIOLOGIST:  Dr. Andee Lineman.   CHIEF COMPLAINT:  Chest pain.   HISTORY OF PRESENT ILLNESS:  [name] Christensen is a 77 year old male with an  extensive history of coronary artery disease.  He was last cathed in  October 2007 at which time all of his vein grafts were patent, and the  LIMA to LAD was atretic.  However, there was no critical disease in the  LAD.  He had no critical distal disease, and medical therapy was  recommended.  His EF was 60%.  Since that time [name] Christensen has occasional  chest pain with episodes of unusual stress or exertion, but generally is  able to have an active life and exercise without symptoms.   Today, [name] Christensen was scheduled for lab work so he was fasting.  He had  taken his medications.  He was at the gym exercising at 5:15 this a.m.  when he had onset of substernal chest pain.  It was his usual angina.  It was a tightness.  He had no associated symptoms.  He stopped  exercising and took sublingual nitroglycerin x2 which relieved his pain  in approximately 15 minutes.  After he took the nitroglycerin he felt  very weak.  He went home.  After he went home he took his blood  pressure, and his systolic blood pressure was as low as 69.  He took it  several times.  He was concerned because of the weakness and came to the  emergency room.  Currently he still feels a little weak, but is,  otherwise, symptom free.   PAST MEDICAL HISTORY:  1. Status post aortocoronary bypass surgery in 2005 with LIMA to LAD,      SVG to D2, SVG to OM-1 and OM-2, SVG to distal RCA.  2.  Status post cardiac catheterization in October 2007 with LIMA to      LAD atretic, LAD itself 40%, all vein grafts patent with no      critical distal disease; 80% first diagonal not bypassed is a small      vessel and medical therapy was recommended.  3. Preserved left ventricular function with an EF of 60% at cath.  4. Hypertension.  5. Hyperlipidemia.  6. Family history of coronary artery disease.  7. BPH.  8. Possible gastroesophageal reflux disease symptoms.   PAST SURGICAL HISTORY:  Status post cardiac catheterizations as well as  bypass surgery, appendectomy, back surgery x2, carotid endarterectomy.   ALLERGIES:  He has issues with LIPITOR with arthralgias, but continues  to take Pravachol and he also is allergic to PENICILLIN.   CURRENT MEDICATIONS:  1. Aspirin 81 mg a day.  2. Plavix  75 mg a day.  3. Atenolol 50 mg a day.  4. Lisinopril 10 mg a day.  5. Pravastatin 20 mg a day.  6. NitroQuick p.r.n.  7. Zetia 10 mg daily.  8. Fish oil 1000 mg two tabs daily.  9. Multivitamin daily.  10.Uroxatral 10 mg a day.  11.Axid 75 mg p.r.n.   SOCIAL HISTORY:  Lives in [place] with his wife and still works in  Audiological scientist estate.  He has no history of alcohol, tobacco or drug abuse.  He  is compliant with a heart healthy diet and exercises regularly.   FAMILY HISTORY:  His mother died at age 71 with a history of MI and  hypertension.  He has one brother that died at age 81 of an MI.  His  father lived to be 51 and had no known coronary artery disease.   REVIEW OF SYSTEMS:  He currently has a headache.  He is weak.  He has  arthralgias secondary to statins.  He has had some reflux symptoms in  the past, but does not routinely get them.  He has no hematemesis,  hemoptysis or melena.  He has had no recent fevers or chills.  He did  not get presyncope with this episode.  The Review of Systems is,  otherwise, negative.   PHYSICAL EXAMINATION:  VITAL SIGNS:  Temperature is 95.8,  blood pressure  109/55, respiratory rate 16, O2 saturation 98% on room air.  GENERAL:  He is a well-developed, well-nourished white male in no acute  distress.  HEENT:  Normal.  NECK:  There is no lymphadenopathy, thyromegaly, bruit or JVD noted.  CV:  Heart is regular in rate and rhythm without any significant, rub,  murmur or gallop noted.  His distal pulses are  2+ in all four extremities.  LUNGS:  Clear to auscultation bilaterally.  SKIN:  No rashes or lesions are noted.  ABDOMEN:  Soft and nontender with active bowel sounds and no AAA by  exam.  EXTREMITIES:  He had no cyanosis, clubbing or edema noted.  MUSCULOSKELETAL:  There is no joint deformity or effusions and no spine  or CVA tenderness.  NEUROLOGICAL:  He is alert and oriented.  Cranial nerves II-XII grossly  intact.   Chest x-ray is pending.   His EKG is sinus bradycardia, rate 50 with a right bundle branch block  which is old and inferior Q waves.[name]   LABORATORY VALUES:  Hemoglobin 13.9, hematocrit 41.  Sodium 134,  potassium 5.1, chloride 102, BUN 19, creatinine 1.3.  Point of care  cardiac enzymes negative x1.   IMPRESSION:  1. Chest pain.  [name] Christensen chest pain was his usual angina, but it      was in the setting of unusual stress associated with decreased      volume and possible hypotension prior taking his nitroglycerin      while he was exercising.  He will be admitted, and myocardial      infarction will be ruled out.  He will be hydrated gently.  We will      check orthostatic vital signs in the emergency room and in a.m..      If his orthostatic vital signs are negative tomorrow, his cardiac      enzymes are negative, and his EF is still preserved on      echocardiogram, he can be considered stable for discharge with an      outpatient Myoview to assess for ischemia.  2.  Bradycardia:  [name] Christensen is on his home dose of atenolol 50 mg a      day.  His heart rate is slightly low, but he has no history  of      symptoms from that.  We will continue his home dose of atenolol and      decrease it on a p.r.n. basis if he has symptomatic bradycardia.      Theodore Demark, PA-C      Noralyn Pick. [name], MD, [facility]  Electronically Signed    RB/MEDQ  D:  06/01/2007  T:  06/01/2007  Job:  [id number]   cc:   Georgina Quint. Plotnikov, MD  [facility]

## 2011-04-12 NOTE — Cardiovascular Report (Signed)
NAMERICCO, DERSHEM                            ACCOUNT NO.:  [id number]   MEDICAL RECORD NO.:  [id number]                   PATIENT TYPE:  OIB   LOCATION:  2904                                 FACILITY:  [facility]   PHYSICIAN:  Charlies Constable, M.D. [facility]              DATE OF BIRTH:  [birth year]   DATE OF PROCEDURE:  07/20/2004  DATE OF DISCHARGE:                              CARDIAC CATHETERIZATION   CLINICAL HISTORY:  Mr. Zahner is 77 years old and was hospitalized recently  in [place] with a non ST elevation infarction, underwent tandem overlying  Cypher stents in the circumflex marginal vessel and PTCA of the second  marginal branch.  He had residual disease in the right coronary artery and  developed recurrent angina, was seen by Dr. Andee Lineman.  Did a Cardiolite scan  which showed inferior ischemia and scheduled him for evaluation with  angiography.   PROCEDURE:  The procedure was performed via the right femoral artery using  arterial sheath and 6-French preformed coronary catheters.  A front wall  arterial puncture was performed and Omnipaque contrast was used.  After  completion of the diagnostic study we made a decision to proceed with  intervention on the right coronary artery.  We used a 6-French right bypass  graft guiding catheter with side holes and a short Luge wire.  We crossed  the lesions in the ostial mid and distal portion of the right coronary  artery with the wire without difficulty.  We predilated with a 3.0 x 20 mm  Maverick balloon performing two inflations up to 8 atmospheres for 30  seconds in the mid lesion and two inflations up to 18 atmospheres in the  distal lesion, but were unable to expand the balloon fully.  We then tried a  3.25 x 20 mm Quantum Maverick and a 3.25 x 15 mm noncompliant Maverick and  were still unable to expand the balloon due to heavy calcification of the  lesion.  The noncompliant Ranger ruptured and we had to remove that.  At  this point we  decided to perform rotational atherectomy.  We upgraded our  guide to a 7-French guide and passed a Roto floppy wire down to the distal  vessel.  We used a buddy wire before we passed the Roto floppy wire.  We  then went in with a 1.5 burr and performed three runs at approximately  150,000 RPMs.  We passed a temporary pacer via the right femoral vein prior  to the rotational atherectomy runs.  We then went back in with the 3.25 x 20  mm Quantum Maverick, but still were unable to completely expand the balloon.  We then went in with a 3.25 x 10 mm cutting balloon and we took this up from  12 atmospheres and were still unable to expand the balloon.  At this point  we decided to IVUS the lesion.  We  passed an Atlantis ultrasound catheter  down across the lesion and documented there was 360 degrees of heavy  calcification at the lesion over about a 3-4 mm stretch.  The minimal lumen  was about 1.3 x 1.4 mm.   At this point we had used a large amount of contrast and had been in the  laboratory for close to three hours and we elected to stop.  Despite the  long, difficult procedure, the patient tolerated the procedure well and left  the laboratory in satisfactory condition.   CONCLUSION:  1. Coronary artery disease status recent PCI in June in [place] with 40%     narrowing of the proximal left anterior descending, 90% narrowing in the     first diagonal and 70% narrowing in the second diagonal branch of the     left anterior descending, 70% stenosis within the stent in the second     marginal branch of the circumflex artery, 50% narrowing at the PTCA site     in the first smaller marginal branch, and 90% stenosis in the distal     circumflex artery before a posterolateral branch, 70% ostial stenosis in     the right coronary artery, 80% stenosis in the mid right coronary artery,     and 90% stenosis in the distal right coronary artery with heavy     calcification and mild inferior wall  hypokinesis.  2. Unsuccessful PCI of the lesion in the distal right coronary artery with     rotational atherectomy and balloon angioplasty due to heavy calcification     and inability to expand the balloon.   DISPOSITION:  The patient will return to postanesthesia care unit for  further observation.  Despite the long procedure he remained stable during  the procedure.  I think at this point the best option is surgical bypass.  Dr. Laneta Simmers has been consulted.                                               Charlies Constable, M.D. [facility]    BB/MEDQ  D:  07/20/2004  T:  07/22/2004  Job:  [id number]   cc:   Donaciano Eva, M.D.   CP Lab   Learta Codding, M.D. [facility]

## 2011-04-12 NOTE — Discharge Summary (Signed)
[name] Christensen, [name]                            ACCOUNT NO.:  [id number]   MEDICAL RECORD NO.:  [id number]                   PATIENT TYPE:  INP   LOCATION:  2040                                 FACILITY:  [facility]   PHYSICIAN:  Evelene Croon, M.D.                  DATE OF BIRTH:  1932-08-01   DATE OF ADMISSION:  07/20/2004  DATE OF DISCHARGE:  08/01/2004                                 DISCHARGE SUMMARY   ADMISSION DIAGNOSIS:  Chest pain.   ADDITIONAL/DISCHARGE DIAGNOSES:  1.  Severe three-vessel coronary artery Christensen status post coronary artery      bypass grafting x5 as well as right coronary endarterectomy, completed      on July 27, 2004.  2.  History of non-entity elevation myocardial infarction in June 2005.  3.  History of percutaneous transluminal coronary angioplasty and stenting      of the second obtuse marginal branch in June 2005.  4.  Hyperlipidemia.  5.  Hypertension.  6.  History of appendectomy in 1950.  7.  History of lumbar laminectomy in the past.  8.  Postoperative anemia.   HOSPITAL MANAGEMENT/PROCEDURES:  1.  Cardiac catheterization on July 20, 2004, completed by Dr. Charlies Constable.  This revealed severe three-vessel coronary artery Christensen.  2.  Arterial evaluation for anticipated cardiac surgery, completed on July 21, 2004.  This includes bilateral carotid duplex exam and bilateral      Allen's test.  This revealed no significant internal carotid artery      stenosis.  3.  Coronary artery bypass grafting x5 as well as right coronary      endarterectomy, completed on July 27, 2004.  4.  Blood transfusion of packed red blood cells for postoperative anemia.   CONSULTATIONS:  Cardiac rehab.   HISTORY OF PRESENT ILLNESS:  [name] Christensen is a pleasant 77 year old gentleman  with a known history of cardiac Christensen, including history of non-ST segment  elevation myocardial infarction in June 2005 at [facility] in  [place], [place].  The patient is also status post cardiac  catheterization and PTCA and stenting of the second obtuse marginal at that  time.  The patient was seen in followup by Dr. Andee Lineman and was felt to have  residual significant coronary artery Christensen secondary to persistent chest  pain.  Dr. Andee Lineman felt the patient should be admitted for cardiac  catheterization to further evaluate the patient's chest pain in the face of  known coronary artery Christensen.   HOSPITAL COURSE:  [name] Christensen was electively admitted to [facility]  on July 20, 2004 and underwent cardiac catheterization by Dr. Charlies Constable.  This showed significant three-vessel coronary artery Christensen.  The  LAD has 40-50% proximal stenosis. There was 90% proximal stenosis of a small  first diagonal branch.  The second  diagonal branch had about 70% proximal  stenosis and was a moderate size vessel.  The left circumflex gave off a  small first marginal and about a 50% proximal stenosis and a large second  marginal which had about 70% in-stent restenosis.  There was about 90%  stenosis of the posterolateral branch.  The right coronary artery was  diffusely diseased with 70% ostial stenosis, 80% mid vessel stenosis  and  90% distal stenosis before the posterior descending artery and  posterolateral branch.  Attempt was made to perform angioplasty and  rotational atherectomy on the right coronary artery.  The distal right  coronary artery stenosis could not be dilated with a balloon and  intravascular ultrasound showed circumferential calcification.  Therefore,  this lesion could not be dilated.  The left ventricular ejection fraction  was estimated at about 60% with inferior hypokinesis.  With these findings,  Dr. Juanda Chance initiated a surgical consultation for recurrent severe three-  vessel coronary artery Christensen not amenable to percutaneous intervention.   Dr. Laneta Simmers of the [facility] responded the consultation appropriately on  July 21, 2004.  His impression was that indeed [name] Christensen has three-vessel  coronary artery Christensen with worsening anginal symptoms.  The patient had  positive stress test showing inferior ischemia and has been using an  increasing number of nitroglycerin pills to relieve the pain.  He agreed  that coronary artery bypass grafting surgery was the best treatment for this  patient at the time.  The risks, benefits and alternatives to the procedure  were discussed with the patient and his family at that time.  They were in  understanding and agreed to proceed with surgery.  He was scheduled for  surgery Friday, July 27, 2004.  While awaiting surgery, the patient  underwent preliminary arterial  evaluation.  This included bilateral duplex  exam.  These revealed no evidence of internal carotid artery bilaterally.  The patient had normal bilateral palmar arch tests and palpable peripheral  pulses bilaterally.  The patient was maintained on IV nitroglycerin and  Integrilin for angina and known recurrent coronary artery Christensen.  The  patient remained stable and free of chest pain until the time of surgery.   The patient was then taken to the operating room on July 27, 2004.  Underwent coronary artery bypass grafting x5 utilizing the left internal  mammary artery to the left anterior descending, saphenous vein graft to the  secondary diagonal branch, saphenous vein graft sequence to the first and  second obtuse marginal branches as well as saphenous vein graft to the right  coronary artery.  Endarterectomy was also completed.  The greater saphenous  vein was endoscopically harvested from the right leg.  Overall the patient  tolerated the procedure well and was transferred to the surgical intensive  care unit in critical but stable condition.  The patient received a blood  transfusion postoperatively for anemia.  The patient was extubated successfully later that evening without any  difficulty.   Postoperatively the patient made steady progress towards recovery.  He awoke  from anesthesia neurologically intact.  His chest tubes were discontinued in  stepwise fashion when the output was within acceptable limits. The patient  invasive lines were all discontinued when deemed appropriate.  The patient  resumed normal bowel and bladder function.  He was started on a regular diet  and was tolerating this well.  He was initiated on cardiac rehab, phase 1,  and his activity was advanced as tolerated.  His pain was well controlled on  oral medications.  His incisions were healing well without evidence of  infection.  The patient's heart rate and rhythm remained stable, and he was  restarted on his preoperative medications.   The patient continued to do well and was deemed appropriate for initiation  of discharge planning on July 31, 2004 or postoperative day #4.  The  patient was ambulating in the hallways with his wife without difficulty.  He  remained afebrile.  He was hemodynamically stable with blood pressure  124/76.  His SPO2 was in the 90's on room air.  His respirations were 20 and  unlabored.  His weight was 159 pounds which was seven pounds above  preoperative weight.  He had diuresed over 900 mL in a 24-hour period.   PHYSICAL EXAMINATION:  VITAL SIGNS: The patient's heart was in a regular  rate and rhythm and reading sinus tachycardia on telemetry.  LUNGS:  His lungs were clear to auscultation.  ABDOMEN:  His abdomen was soft, nontender, nondistended with good bowel  sounds.  EXTREMITIES:  His extremities revealed 1+ ankle edema, right leg; otherwise  they were warm and dry with 2+ posterior tibial pulses bilaterally.  His  incisions were clean, dry and intact without evidence of infection. His derm  was stable.   LABORATORY DATA ON DISCHARGE:  WBC from September 5 reads WBC 8.6,  hemoglobin 8.3, hematocrit 24, platelet count of 141.  Sodium 136,  potassium 3.7, chloride 100, CO2 31, glucose 134, BUN 7,  creatinine 4.  A two-view chest x-ray completed on September 5 reads  improved interstitial edema and persistent pleural effusion and persistent  pleural effusion and bilateral lower lobe patchy infiltrate or atelectasis,  on the left greater than the right.  Heart size was normal.  There was no  evidence of pneumothorax.   DISPOSITION:  We will continue to plan for discharge on August 01, 2004,  pending A rounds and no change in the patient's medical status.   DISCHARGE MEDICATIONS:  1.  Aspirin 325 mg daily.  2.  Toprol-XL 50 mg daily.  3.  Altace 5 mg daily.  4.  Zetia 10 mg daily.  5.  Plavix 75 mg daily.  6.  Lasix 40 mg daily for five days.  7.  Potassium 20 mEq daily for five days.  8.  Iron supplementation 150 mg daily for one month.  9.  Ultram one to two tablets every four to six hours as needed for pain.   DISCHARGE INSTRUCTIONS: 1.  Activity:  The patient should avoid driving.  She should avoid heavy      lifting or strenuous activity.  He should continue to walk daily.  He      should continue his breathing exercises daily for one more week.  2.  Diet.  The patient should follow a low-fat, low-salt diet.  3.  Wound care.  The patient may shower.  He should wash his incisions daily      with soap and water.  He should notify the [facility] office if he any      redness, swelling or drainage from his incisions.   FOLLOW UP:  1.  The patient is to see Dr. Lewayne Bunting within two weeks of discharge.  The      patient is to call [phone] to make that appointment date and time.  2.  The patient is to see Dr. Laneta Simmers on Tuesday, August 21, 2004 at 11:45  a.m.  3.  Otherwise, the patient is instructed to call the CVTS office if  he has      any questions or concerns in the meantime.      Carolyn A. Eustaquio Boyden.                  Evelene Croon, M.D.    CAF/MEDQ  D:  07/31/2004  T:  07/31/2004  Job:   [id number]   cc:   Learta Codding, M.D. [facility]   Tarnov Sink, M.D.  [facility]

## 2011-04-12 NOTE — Consult Note (Signed)
[name] Christensen, [name] Christensen                            ACCOUNT NO.:  [id number]   MEDICAL RECORD NO.:  [id number]                   PATIENT TYPE:  OIB   LOCATION:  2904                                 FACILITY:  [facility]   PHYSICIAN:  Evelene Croon, M.D.                  DATE OF BIRTH:  [birth year]   DATE OF CONSULTATION:  07/21/2004  DATE OF DISCHARGE:                                   CONSULTATION   REFERRING PHYSICIAN:  Dr. Charlies Constable.   REASON FOR CONSULTATION:  Severe three vessel coronary disease with unstable  angina.   HISTORY OF PRESENT ILLNESS:  This patient is a 77 year old gentleman who was  admitted in June, 2005, with a non-ST segment elevation myocardial  infarction at [facility] in [place].  He underwent cardiac  catheterization and PTCA there and was found to have occluded second obtuse  marginal branch that was stented with a CYPHER stent.  During the same  procedure he also had angioplasty of thee first obtuse marginal.  Ejection  fraction was 45% with posterior akinesis.  He was felt to have residual  significant coronary disease that included moderate disease in the LAD and  the left circumflex as well as diffusely diseased right coronary artery.  Since his procedure was performed he has continued to have fatigue and  weakness as well as exertional substernal chest pressure and pain in both  shoulders when he starts to walk in the morning.  He said that he can  continue walking and the pain gradually improves.  He thought that this was  probably arthritis.  He has noted a marked decrease in his energy level.  These symptoms seem to be worsening.  He has been taking NitroQuick which  has helped.  His initial bottle of pills lasted about a month but the second  bottle did not last very long.  He did have a stress test done which  demonstrated a posterolateral infarct as well as residual inferior ischemia.  He was scheduled for cardiac catheterization which was  performed yesterday.  This showed significant three vessel coronary disease.  The LAD had 40-50%  proximal stenosis.  There was 90% proximal stenosis of a small first  diagonal branch.  The second diagonal branch had about 70% proximal  stenosis.  This is a moderate sized vessel.  The left circumflex gave off a  small first marginal and about a 50% proximal stenosis and a large second  marginal which had about 70% instent restenosis.  There is about 90%  stenosis of a posterolateral branch.  The right coronary artery was  diffusely diseased with 70% ostial stenosis, 80% mid vessel stenosis and 90%  distal stenosis before the posterior descending and posterolateral branches.  Attempt was made to perform angioplasty and rotational atherectomy on the  right coronary artery.  The distal right coronary artery stenosis could not  be  dilated with a balloon and intravascular ultrasound showed  circumferential calcification.  Therefore, this lesion could not be dilated.  Left ventricular ejection fraction was about 60% with inferior hypokinesis.   REVIEW OF SYSTEMS IS AS FOLLOWS:  GENERAL:  He denies any fever or chills.  He has had no weight loss.  He has had fatigue.  EYES:  Negative.  ENT:  Negative.  ENDOCRINE:  He denies diabetes and hypothyroidism.  CARDIOVASCULAR:  As above.  He has had exertional chest pain and shortness  of breath as well as fatigue.  He has had a few palpitations.  He denies  peripheral edema.  RESPIRATORY:  Has had no cough or sputum production.  GI:  He has had no nausea or vomiting.  Denies melena and bright red blood per  rectum.  GU:  He denies dysuria and hematuria.  HEMATOLOGICAL:  He denies  any history of bleeding disorders or easy bleeding.  PSYCHIATRIC:  Negative.   ALLERGIES:  PENICILLIN CAUSES BLISTERS.  WITH LIPITOR HE HAD SIGNIFICANT  MYALGIAS.  HE IS ALSO ALLERGIC TO PRAVACHOL.   PAST MEDICAL HISTORY:  Is significant for hyperlipidemia and  hypertension.  He had no history of diabetes.   PAST SURGICAL HISTORY:  Is significant for appendectomy in 1950 and lumbar  laminectomy in the past.\   SOCIAL HISTORY:  He lives in [place] with his wife.  He is a nonsmoker  and denies alcohol abuse.   FAMILY HISTORY:  Is positive for coronary disease.  His mother had coronary  disease in her late 55s.   PHYSICAL EXAMINATION:  The blood pressure is 125/65 and his pulse is 62 and  regular.  Respiratory rate is 18, unlabored.  He is a thin black male in no  distress.  HEENT EXAM:  Shows him to be normocephalic and atraumatic.  Pupils are equal  and reactive to light and accommodation.  Extraocular muscles are intact.  His throat is clear.  NECK EXAM:  Shows normal carotid pulses bilaterally.  There are no bruits.  There is no adenopathy or thyromegaly.  CARDIAC EXAM:  Shows a regular rate and rhythm with a normal S1 and S2.  There is no murmur, rub or gallop.  LUNGS:  Clear.  ABDOMINAL EXAM:  Shows active bowel sounds.  ABDOMEN:  Soft and nontender.  There are no palpable masses or organomegaly.  EXTREMITY EXAM:  Shows no peripheral edema.  Pedal pulses are palpable  bilaterally.  His skin is warm and dry.  NEUROLOGIC EXAM:  Shows him to be alert and oriented x 3.  Motor and sensory  exam is grossly normal.   Electrocardiogram shows normal sinus rhythm with nonspecific ST-T wave  changes.   IMPRESSION:  [name] Christensen has significant three vessel coronary disease with  worsening anginal symptoms.  He has been using an increasing number of  nitroglycerin pills.  He has a positive stress test showing inferior  ischemia.  I agree that coronary artery bypass graft surgery is the best  treatment for this patient.  I discussed the operative procedure with the  patient and his wife and sister including alternatives, benefits, and risks  including bleeding, blood transfusion, infection, stroke, myocardial infarction, graft failure, and  death.  They understand and agree to proceed.  I told him I would not be able to do surgery until this coming Friday unless  there is a cancellation in the schedule.  Dr. Juanda Chance and Dr. Andee Lineman will  make a determination if the  patient should remain hospitalized until the  time of surgery.                                               Evelene Croon, M.D.    BB/MEDQ  D:  07/21/2004  T:  07/22/2004  Job:  [id number]   cc:   Learta Codding, M.D. [facility]   Steele Sizer, M.D.  [address]  [place], [place] [address]

## 2011-04-12 NOTE — H&P (Signed)
NAMEBRYLER, [name] Christensen                ACCOUNT NO.:  [id number]   MEDICAL RECORD NO.:  [id number]          PATIENT TYPE:  OBV   LOCATION:  1824                         FACILITY:  [facility]   PHYSICIAN:  Salvadore Farber, MD  DATE OF BIRTH:  [birth year]   DATE OF ADMISSION:  08/26/2006  DATE OF DISCHARGE:                                HISTORY & PHYSICAL   CHIEF COMPLAINT:  Chest pain.   HISTORY OF PRESENT ILLNESS:  Mr. Depoy is a 77 year old gentleman with  coronary artery disease for which he is status post placement of two Cypher  stents in the obtuse marginal branch of the circumflex in June 2005 while he  was in [place].  In August 2005, he underwent coronary artery bypass  grafting.  He has done well since then.  He is active and exercises several  days per week.  About a week ago while hurrying, he had an episode of chest  discomfort lasting approximately 15 minutes.  He has not had any further  symptoms while exercising at the gym or otherwise.  However, this morning  while working in [employer], he had substernal chest discomfort  which lasted approximately three hours.  He did not take nitroglycerin for  it.  He did eventually take several aspirin and it went away shortly  thereafter.  There were no associated symptoms and no radiation.  He is pain  free currently.   PAST MEDICAL HISTORY:  1. Status post non-ST elevation myocardial infarction in June 2005 treated      with two Cypher stents to the obtuse marginal.  2. Status post coronary bypass grafting in August 2005.  3. Hypertension.  4. Hypercholesterolemia.  5. Asymptomatic carotid stenosis.  6. Status post appendectomy.  7. Status post right carotid endarterectomy.   ALLERGIES:  LIPITOR, PENICILLIN, PRAVACHOL.   CURRENT MEDICATIONS:  Plavix 75 mg per day, aspirin 81 mg per day,  lisinopril 10 mg per day, atenolol 50 mg per day, fish oil 1 gram four times  per day, pravastatin 40 mg per day, Zetia 10 mg  per day, multivitamin, Axid  75 mg per day, Urotraxal 10 mg per day.   SOCIAL HISTORY:  The patient lives with his wife in [place].  He is  involved in real estate.  He denies alcohol or illicit drug use.   FAMILY HISTORY:  Mother died in her 72s of coronary disease.  Father died at  a late age without history of atherosclerosis.  His brother died in his  early 55s of myocardial infarction.   REVIEW OF SYSTEMS:  He wears glasses.  He has bilateral hearing loss which  is chronic.  He has occasional back pain, myalgias, and GERD symptoms.  Review of systems is otherwise negative in detail except as above.   PHYSICAL EXAMINATION:  GENERAL:  He  is a well appearing, thin man in no distress.  VITAL SIGNS:  Heart rate 62, blood pressure 128/64, and temperature of 98.  He has oxygen saturation of 100% on room air.  HEENT:  Normal.  SKIN:  Exam is normal.  NECK:  There is no jugular venous distension, thyromegaly, lymphadenopathy.  LUNGS: Clear to auscultation.  HEART:  He has a nondisplaced point of maximal cardiac impulse.  There is a  regular rate and rhythm without murmur, rub or gallop.  ABDOMEN:  Soft, nondistended, nontender.  There is no hepatosplenomegaly.  No pulsatile midline mass.  Bowel sounds are normal.  EXTREMITIES: Warm without clubbing, cyanosis, edema, or ulceration.  PULSES:  Carotid pulses 2+ bilaterally with a soft bruit on the left.  Femoral pulses 2+ bilateral without bruit.  PT pulses 2+ bilaterally.  NEUROLOGICAL:  He is alert and oriented x3 with cranial nerves II-XII  intact.  Strength and sensation intact in all four extremities.  His affect  is normal.   STUDIES:  Chest x-ray demonstrates no acute disease.  Electrocardiogram  demonstrates normal sinus rhythm at 62 beats per minute with right bundle  branch block.  No evidence of ischemia.   LABORATORY STUDIES:  Remarkable for hematocrit 38, creatinine 1.1, potassium  4.5, sodium 130, troponin less than  0.05, INR 1.1.   IMPRESSION/RECOMMENDATIONS:  1. Chest pain:  Occurs in the setting of known coronary disease.  Symptoms      are different from those with his myocardial infarction but also      different from his reflux symptoms.  We will admit him and rule out      myocardial infarction.  We will treat with continuation of his aspirin      and Plavix and initiation of heparin.  After our discussion with the      patient, his wife, and Dr. Andee Lineman, will proceed to cardiac      catheterization tomorrow for definitive exclusion of myocardial      ischemia as the cause of his symptoms.  2. Hypertension:  Nicely controlled.  #.  Hypercholesterolemia:  Check fasting lipid profile while he is here,  continue statin.      Salvadore Farber, MD  Electronically Signed     WED/MEDQ  D:  08/26/2006  T:  08/27/2006  Job:  [id number]   cc:   Georgina Quint. Plotnikov, MD  Learta Codding, MD,FACC

## 2011-04-12 NOTE — Assessment & Plan Note (Signed)
[facility]                            [facility] NOTE   [name] Christensen, [name] Christensen                       MRN:          [id number]  DATE:09/16/2006                            DOB:          June 02, 1932    HISTORY OF PRESENT ILLNESS:  The patient is a 77 year old male status post  coronary artery bypass grafting. The patient had a recent cardiac  catheterization due to atypical chest pain. He was found to have all three  vein grafts to be patent. He did have an atretic LIMA but the upstream LAD  was within normal limits. He states that he has been doing well. He has  increased his exercise level, denies any chest pain, shortness of breath,  orthopnea or PND. He reports no palpitations or syncope. We did review with  [name] Christensen his laboratory work and he appears to be slightly anemic. His  lipid profile is well-controlled.   MEDICATIONS:  1. Plavix 75 mg a day.  2. Atenolol 50 mg a day.  3. Lisinopril 10 mg a day.  4. Pravachol 40 mg p.o. every other day.  5. Zetia 10 mg p.o. daily.  6. Fish oil.  7. Multivitamin.  8. Aspirin.  9. Axid  10.UroXatral 10 mg a day.   PHYSICAL EXAMINATION:  VITAL SIGNS:  Blood pressure 118/68, heart rate 64  beats per minute. Weight is 154 pounds.  NECK:  Reveals normal carotid upstroke and __________  bruits.  LUNGS:  Clear breath sounds bilaterally.  HEART:  Regular rate and rhythm with normal S1, S2. No murmurs, rubs or  gallops.  ABDOMEN:  Soft, nontender, no rebound or guarding. Good bowel sounds.  EXTREMITIES:  Reveals no cyanosis, clubbing or edema.   PROBLEM LIST:  1. Coronary artery disease status post coronary artery bypass grafting in      2005.  2. Status post recent catheterization with patent vein graft and atretic      LIMA but nonobstructed upstream LAD.  3. History of non ST elevation myocardial infarction.  4. Dyslipidemia, LDL 80 with total cholesterol 134 and HDL 44.  5. Hypertension  controlled.  6. Gastroesophageal reflux disease.  7. Anemia.   PLAN:  1. I have asked the patient to followup his blood count in 6-8 weeks at      which time we will do a lipid panel and LFTS.  2. I have made no changes in his medical regimen regarding his heart      disease. I did switch him to Pravachol 20 mg p.o. daily as the patient      stated that he has myalgias associated with statins.  3. I have asked the patient to followup with Dr. Posey Rea regarding      future needs for colonoscopy.       Learta Codding, MD,FACC     GED/MedQ  DD:  09/16/2006  DT:  09/17/2006  Job #:  [id number]

## 2011-04-12 NOTE — Op Note (Signed)
[place]. [facility]  Patient:    [name] Christensen, [name] Christensen                         MRN: [id number] Proc. Date: 03/20/01 Adm. Date:  [id number] Disc. Date: [id number] Attending:  Donalee Citrin P                           Operative Report  PREOPERATIVE DIAGNOSIS:  Severe spinal stenosis, L4-5, right, from a large ruptured disk and right L5 radiculopathy, as well as severe spinal stenosis at L3-4 with a right L4 radiculopathy.  PROCEDURE:  Decompressive lumbar laminectomy, unilaterally at L4-5, with microdissection of the right L5 nerve root and microscopic diskectomy at the L4-5 disk space.  In addition, decompressive lumbar laminotomy, hemilaminotomy, and foraminotomy of the right L4 nerve root.  SURGEON:  Donzetta Sprung. Roney Jaffe., MD  ANESTHESIA:  General endotracheal.  BRIEF HISTORY: The patient is a very pleasant 77 year old gentleman who has had progressively worsening back and right lower extremity pain that radiates into his hip and down the back of his leg and top of his foot with numbness and tingling in the same distribution. The patient had been managed conservatively over the last couple of years with anti-inflammatories, physical therapy, and epidural steroid injections for limited relief.  The patients pain became acutely worse a couple of months ago.  Reimaging showed a large ruptured disk fragment at L4-5, compressing the right L5 nerve root, as well as progression of his spinal stenosis at L3-4 with severe right L4 nerve root compression.  We extensively went over the risks and benefits of the surgery with the patient, and he agreed to proceed forward.  PROCEDURE:  He was brought to the OR.  He was induced under general anesthesia.  He was positioned prone on the Wilson frame. His back was prepped and draped in the usual sterile fashion.  Preoperative imaging confirmed localization of the needle at the 3-4 interspace. An incision was made after infiltration  with 20 cc of lidocaine with epinephrine.  With a twin blade scalpel, the ______ skin and subcutaneous tissue.  Subperiosteal dissection was carried out in the lamina of L3, L4, and L5 on the patients right side. Intraoperative x-ray confirmed the L4-5 interspace, and then the [facility] drill with a ______ drill bit was used to drill out the inferior aspect of the L3-4 lamina, as well as the medial aspect of the facet complex at 3-4 and 4-5 and superior aspect of the L5 lamina.  Then, using a 3 mm Kerrison punch and 4 mm Kerrison punch, the inferior aspect of the lamina was removed at L4.  The medial aspect of ______ ligamentum flavum was identified and dissected free and removed in a piecemeal fashion, exposing the thecal sac underneath the L4-5 interspace.  The operating microscope was draped, and under microscope illumination, the right L5 nerve root was noticed to be under a severe amount of compression with a combination of ligamentous hypertrophy, medial facet arthropathy, and a large free fragment ruptured disk that was presenting through the axilla at the L5 nerve root.  The medial aspect of the thecal sac was reflected medially.  The L5 nerve root was visualized laterally.  On working in the ______ nerve root, the 11 blade scalpel was used to make the annulotomy, and several large fragments of disk were removed from within the axilla.  After this was  removed, the nerve root was decompressed and was dissected and reflected medially with the thecal sac with the ______ nerve root retractor, and the remainder of the ______ was incised and cleaned out using pituitary rongeurs.  The remainder of the lateral ligamentum flavum was also dissected free.  After diskectomy, the L5 nerve root was noted to be completely decompressed proximally.  The foramen was extended out laterally using a 3 mm Kerrison punch and explored with an angled hockeystick, and the nerve root was noted to be  under no further compression, both medially, cephalocaudad, and ______. Then, attention was taken to the 3-4 laminotomy.  After the inferior aspect of the lamina of L3 had been removed, the ligamentum flavum was removed in piecemeal fashion.  The lateral ligament was removed under microscopic illumination with the 3 mm Kerrison punch.  The proximal aspect of the L4 nerve root was visualized, and this was decompressed at its foramen.  There was noted to be a severe amount of ligamentous hypertrophy and ______, compressing the proximal aspect of the L4 nerve root. The disk was investigated and explored and noted not to be ruptured.  Therefore, the epidural veins were just coagulated.  The remainder of the lateral gutter work was decompressed with removal of the hypertrophied ligament.  Then, angled hockeysticks were used to explore the L4 nerve root and was noted to be completely decompressed.  Meticulous hemostasis was maintained.  Gelfoam was overlain at the top of the foraminotomy site, as well as the decompressed laminectomy site at L4-5.  Then, the self-retaining retractors were removed. The fascia was closed with 0 interrupted Vicryls. The subcutaneous tissue was closed with 2-0 interrupted Vicryl.  The skin was closed with running 4-0 subcuticular.  Benzoin and Steri-Strips were applied. The patient went to the recovery room in a stable condition.  At the end of the case, all needle counts and sponge counts were correct. DD:  03/20/01 TD:  03/22/01 Job: [id number]

## 2011-04-12 NOTE — Discharge Summary (Signed)
[name] Christensen, [name] Christensen                ACCOUNT NO.:  [id number]   MEDICAL RECORD NO.:  [id number]          PATIENT TYPE:  OBV   LOCATION:  2807                         FACILITY:  [facility]   PHYSICIAN:  Salvadore Farber, MD  DATE OF BIRTH:  [birth year]   DATE OF ADMISSION:  08/26/2006  DATE OF DISCHARGE:  08/27/2006                                 DISCHARGE SUMMARY   PROCEDURES:  1. Cardiac catheterization.  2. Coronary arteriogram.  3. Left ventriculogram.   TIME AT DISCHARGE:  24 minutes   PRIMARY DIAGNOSIS:  Chest pain.   SECONDARY DIAGNOSIS:  1. Status post aortocoronary bypass surgery in August 2005 with left      internal mammary to the left anterior descending, saphenous vein graft      to diagonal two, saphenous vein graft to first obtuse marginal and      second obtuse marginal, and saphenous vein graft to right coronary      artery.  2. Preserved left ventricular function with an ejection fraction of 60% on      cath this admission with posterolateral hypokinesis.  3. Status post non-ST segment elevation myocardial infarction in June 2005      prior to bypass with Cypher stent x2 to the circumflex and percutaneous      transluminal coronary angiography to the obtuse marginal.  4. Hypertension.  5. Hyperlipidemia.  6. History of vascular disease in his carotids.  7. History of anemia after bypass surgery requiring transfusion.  8. Allergy or intolerance to LIPITOR, PENICILLIN and high dose PRAVACHOL.  9. Status post back surgery, appendectomy and right carotid      endarterectomy.  10.Family history of coronary artery disease in his mother and brother.   HOSPITAL COURSE:  [name] is a 77 year old male with a history of  coronary artery disease.  He had onset of substernal chest pain described as  a pressure and reaching a 3-4/10 at 8 a.m. on the day of admission.  He was  given baby aspirin x4 when he sought help after he had been in pain for  approximately three hours  and symptoms resolved.  He was advised to come to  the emergency room which he did and he was admitted overnight for further  evaluation and treatment.   His cardiac enzymes were negative for MI.  He had a lipid profile performed  as part of his evaluation that showed a total cholesterol 134, triglycerides  49, HDL 44, LDL 80.  He was mildly anemic with a hemoglobin of 11.7,  hematocrit of 34.7, but his MCV was within normal limits and white count and  platelets were also within normal limits.  His albumin was slightly low at  3.2, but other CMET values were within normal limits.  TSH was 2.358.   It was felt that the best way to evaluate him was by cardiac  catheterization.  This was performed on August 27, 2006.  The cardiac  catheterization showed patent bypass grafts although the LIMA was atretic.  The LAD had a 30-40% stenosis and there was TIMI III flow  to the distal  vessel.  The RCA was totaled proximal to the graft insertion and the OM2 had  an 80% lesion, but this is a small vessel and medical therapy is  recommended.  The SVG to diagonal and the SVG to OM1 and OM3 was patent.  His EF was 60%.   Dr. Samule Ohm evaluated the films and felt that he had noncardiac chest pain.  He felt that gastroesophageal reflux disease was suspected as the cause of  his symptoms.  He had been taking Axid prior to admission and he is  encouraged to take this on a b.i.d. basis for a week and then come back to  daily to see if his symptoms resolve.  He is to follow up with Dr. Posey Rea  and Dr. Andee Lineman.  Post cath, [name] was ambulating without chest pain or  shortness of breath and considered stable for discharge with outpatient  follow-up arranged.   DISCHARGE INSTRUCTIONS:  His activity level is to be increased gradually.  He is to stick to a low-fat diet.  He is to call our office for any problems  with the cath site.  He is to follow up with Dr. Andee Lineman on a October 23 at  12 noon and with  Dr. Posey Rea on October 17 at 2:30.  He is not to drive  for two days and no lifting for a week.   DISCHARGE MEDICATIONS:  1. Plavix 75 mg daily.  2. Atenolol 50 mg daily.  3. Lisinopril 10 mg a day.  4. Zetia 10 mg a day.  5. Aspirin 81 mg a day.  6. Sublingual nitroglycerin p.r.n.  7. Fish oil 1 tablet q.i.d.  8. Multivitamin daily.  9. Axid, dose believed to be 75 mg 1 tablet b.i.d. for one week then 1      tablet daily.  10.Urotraxal 10 mg daily.  11.Pravastatin 40 mg a day.     ______________________________  Theodore Demark, PA-C      Salvadore Farber, MD  Electronically Signed    RB/MEDQ  D:  08/27/2006  T:  08/28/2006  Job:  [id number]   cc:   Georgina Quint. Plotnikov, MD

## 2011-04-12 NOTE — Assessment & Plan Note (Signed)
[facility]                             PRIMARY CARE OFFICE NOTE   [name] Christensen, [name] Christensen                       MRN:          [id number]  DATE:06/05/2006                            DOB:          1932-07-30    HISTORY OF PRESENT ILLNESS:  The patient is a 77 year old new patient who  presents for a Wellness examination.   PAST MEDICAL HISTORY:  Coronary artery disease status post bypass surgery in  2005.  History of elevated PSA.   CURRENT MEDICATIONS:  UroXatral one daily, pravastatin 40 mg daily, atenolol  50 mg daily, lisinopril 10 mg daily, fish oil 2 twice a day, multivitamin,  Zetia 10 mg daily, Plavix one daily, aspirin 81 mg daily.  Nitroglycerin  p.r.n.   ALLERGIES:  PREVIOUS INTOLERANCE TO LIPITOR.  PRAVACHOL LISTED.  ALLERGIC TO  PENICILLIN.   SOCIAL HISTORY:  He sells real estate.  He is married with 5 kids and 12  grand kids.  Does not smoke.  Works out every day at [employer]   REVIEW OF SYSTEMS:  No chest pain or shortness of breath.  Occasional GERD.  The rest is negative.   PHYSICAL EXAMINATION:  VITAL SIGNS:  Blood pressure 97/61, pulse 52, temp  96.1, weight 163 pounds.  GENERAL:  He looks well.  HEENT:  Moist mucosa.  NECK:  Supple, no thyromegaly or bruit.  LUNGS:  Clear, no wheeze or rales.  CARDIAC:  S1-S2, no murmur, no gallop.  ABDOMEN:  Soft, nontender, no organomegaly or mass felt.  EXTREMITIES:  Lower extremities without edema.  NEURO:  He is alert and oriented.  He denies being depressed.  GU:  Normal external genitalia.  RECTAL:  Exam is normal, fairly enlarged prostate, no nodules.  Stool guaiac  negative.  No masses.   LABORATORY DATA:  EKG with sinus bradycardia, first degree AV block, heart  rate 45.   ASSESSMENT AND PLAN:  1.  Normal wellness examination. Age/health related issues discussed,      healthy lifestyle discussed.  He had a chest x-ray last in January 2007.      Had a colonoscopy by Dr. Maryruth Bun  at [facility] five years ago.  Had      his carotids checked in 2005 prior to surgery.  Had Pneumovax two years      ago.  Information about Zostavax given.  Obtain lab work appropriate for      age.  2.  Coronary disease on therapy.  Follow up with Dr. Natasha Bence.  3.  Bradycardia with a heart rate of 45.  I asked him to reduce atenolol to      one-half tablet daily.  4.  Gastroesophageal reflux disease.  He will cut back or hold fish oil.  5.  Elevated PSA.  Follow up with Dr. Ralene Cork.                                   [name], MD   AP/MedQ  DD:  06/08/2006  DT:  06/08/2006  Job #:  [id number]

## 2011-04-12 NOTE — Cardiovascular Report (Signed)
NAMEKEENAN, TREFRY                ACCOUNT NO.:  [id number]   MEDICAL RECORD NO.:  [id number]          PATIENT TYPE:  OBV   LOCATION:  2807                         FACILITY:  [facility]   PHYSICIAN:  Salvadore Farber, MD  DATE OF BIRTH:  1932-10-01   DATE OF PROCEDURE:  08/27/2006  DATE OF DISCHARGE:                              CARDIAC CATHETERIZATION   PROCEDURE:  Left heart catheterization, left ventriculography, coronary  angiography, saphenous vein graft and LIMA angiography, StarClose closure of  the right common femoral arteriotomy site.   INDICATIONS:  Mr. Shenk is a 77 year old gentleman, status post lateral  myocardial infarction,  treated with stenting of the circumflex in June 2005  and then coronary artery bypass grafting in September 2005.  He has  generally done well since then, exercising on a regular basis without  symptoms.  However, over the past 2 weeks, he has had two episodes of chest  discomfort occurring at rest.  He feels that their symptoms were both  different from the symptoms which accompanied  his myocardial infarction,  but also some different from his reflux.  He presented to the emergency room  yesterday afternoon for evaluation of the symptoms.  He had has ruled out  for myocardial infarction by serial enzymes and electrocardiograms.  After  discussion of further testing modalities, he elected cardiac catheterization  for definitive exclusion of myocardial ischemia as the etiology of his chest  discomfort.   PROCEDURAL TECHNIQUE:  Informed consent was obtained.  Under 1% lidocaine  local anesthesia, a 5-French sheath was placed in the right common femoral  artery using the modified Seldinger technique.  Diagnostic angiography and  ventriculography were performed using JL-4 and JR-4 catheters for the native  coronaries, JR-4 catheters for each of the vein grafts, LIMA catheter for  the left internal mammary artery, and pigtail catheter for left heart  catheterization and ventriculography.  The arteriotomy was then closed using  a StarClose device.  Complete hemostasis was obtained.  The patient was then  transferred to the holding room in stable condition, having tolerated the  procedure well.  Section.   COMPLICATIONS:  None section.   FINDINGS:  1. LV:  127/0/10.  EF 60%, with posterobasal hypokinesis.  2. No aortic stenosis or mitral regurgitation.  3. Left main:  Angiographically normal.  4. LAD:  Moderate-sized vessel, giving rise to two diagonals.  There is a      30% stenosis of the proximal LAD and a 40% stenosis of the mid-LAD at      the origin of the second diagonal.  The first diagonal was very small      and has an 80% ostial stenosis.  The second diagonal is moderate sized,      and has a 70% ostial stenosis.  The vein graft to the second diagonal      was widely patent, with excellent distal runoff.  The LIMA to mid-LAD      is atretic.  However, there are no significant stenosis upstream of it.  5. Circumflex:  Moderate-sized vessel, giving rise to two marginals.  The      first marginal is moderate sized and has an ostial 90% stenosis.  The      AV groove circumflex has a long stented segment in which there is a      focal area of 50% in-stent restenosis.  There was a sequential      saphenous vein graft to the first and second marginal which is widely      patent with excellent distal runoff.  6. RCA:  There is a low anterior origin to the native right coronary.      There is an 80% stenosis proximally.  The vessel is then occluded at      its midsection after the origin of the second acute marginal.  There is      a widely patent bypass graft to the distal vessel, with excellent      distal runoff.   IMPRESSION AND PLAN:  All three saphenous vein grafts are widely patent.  The LIMA to LAD graft is atretic.  However, he has no significant LAD  disease.  I suspect reflux as the etiology of his symptoms.  Will  discharge  home today, with plan for followup with Dr. Andee Lineman and Dr. Posey Rea.      Salvadore Farber, MD  Electronically Signed     WED/MEDQ  D:  08/27/2006  T:  08/29/2006  Job:  [id number]   cc:   Learta Codding, MD,FACC  Georgina Quint Plotnikov, MD

## 2011-04-12 NOTE — Assessment & Plan Note (Signed)
[facility]                         GASTROENTEROLOGY OFFICE NOTE   [name] Christensen, [name] Christensen                       MRN:          [id number]  DATE:12/17/2006                            DOB:          10-Mar-1932    REFERRING PHYSICIAN:  Georgina Quint. Plotnikov, MD   REASON FOR CONSULTATION:  Rectal bleeding and consideration of  colonoscopy.   HISTORY:  This is a pleasant 77 year old white male with a history of  hypertension, coronary artery disease, status post myocardial  infarction, coronary artery stent placement and subsequent coronary  artery bypass graft surgery and hyperlipidemia.  He is referred through  the courtesy of Dr. Georgina Quint. Plotnikov regarding rectal bleeding.  The  patient reports to me intermittent problems with small amounts of bright  red blood on the tissue with straining.  Associated with straining is  some rectal discomfort.  He states his wife was recently diagnosed with  breast cancer, and as such the bleeding concerns him, that this may  represent something serious such as cancer.  The patient had an  identical problem 4-1/2 years ago, for which he was evaluated by Dr.  Bonnita Levan in [place].  This lead to a complete colonoscopy on  May 12, 2002.  The preparation was good.  The patient was found to have  non-bleeding internal hemorrhoids as well as a 5 mm cecal polyp.  The  polyp was removed and was non-adenomatous (no pathologic changes).  As  such, the patient was told by Dr. Maryruth Bun to have a follow-up colonoscopy  in 10 years.  The patient denies any blood admixed with stool or blood  in the toilet bowl.  No abdominal pain.  His bowel habits have been a  bit more constipated.  He recently saw Dr. Posey Rea who prescribed  MiraLax.  This has helped his bowel habits.  He has also prescribed  Anusol suppositories, which he has yet to fill.   His only other GI history is that of indigestion and heartburn, for  which he  takes Axid p.r.n.  No dysphagia.   PAST MEDICAL HISTORY:  As above.   PAST SURGICAL HISTORY:  In addition to coronary artery bypass graft  surgery he underwent an appendectomy as a child.   ALLERGIES:  PENICILLIN.   CURRENT MEDICATIONS:  1. Atenolol 50 mg daily.  2. Plavix 75 mg daily.  3. Lisinopril 10 mg daily.  4. Lipitor 40 mg daily.  5. NitroQuick p.r.n.  6. Zetia 10 mg daily.  7. Fish oil b.i.d.  8. Multivitamin.  9. Baby aspirin.  10.UroXatral 10 mg daily.  11.Axid 75 mg p.r.n.   FAMILY HISTORY:  No family history of colon cancer or colon polyps.  Mother with heart disease.   SOCIAL HISTORY:  The patient is married with five children.  He is  retired from [employer], but is currently working as a residential  Customer service manager.  He does not smoke or use alcohol.   REVIEW OF SYSTEMS:  Per the diagnostic evaluation form.   PHYSICAL EXAMINATION:  GENERAL:  A pleasant, mildly anxious gentleman,  in  no acute distress.  VITAL SIGNS:  Blood pressure 130/60, heart rate 64 and regular, weight  160 pounds, height 5 feet 8 inches tall.  HEENT:  Sclerae anicteric.  Conjunctivae pink.  Oral mucosa intact.  NECK:  No adenopathy.  LUNGS:  Clear.  HEART:  Regular.  ABDOMEN:  Soft, without tenderness, mass or hernia.  RECTAL:  Reveals no external abnormalities.  No hemorrhoids.  There is a  small slightly tender posterior fissure.  On palpating this area,  replicates his discomfort with a straining bowel movement.  Stool soft,  brown, Hemoccult negative.  EXTREMITIES:  Without edema.   IMPRESSION:  1. Minor intermittent rectal bleeding, due to anal fissure.  2. Negative colonoscopy in June 2003, as described above.  3. Functional constipation.  4. Multiple general medical problems.   RECOMMENDATIONS:  1. Continue with MiraLax for constipation.  2. Anusol suppositories and Sitz baths as needed for symptomatic      fissure.  3. Literature on fissure provided.  4.  Resume medical care with Dr. Posey Rea.  5. A GI followup p.r.n.     Wilhemina Bonito. Marina Goodell, MD  Electronically Signed    JNP/MedQ  DD: 12/17/2006  DT: 12/17/2006  Job #: [id number]   cc:   Georgina Quint. Plotnikov, MD

## 2011-04-12 NOTE — Op Note (Signed)
NAMEKORIN, HARTWELL                            ACCOUNT NO.:  [id number]   MEDICAL RECORD NO.:  [id number]                   PATIENT TYPE:  INP   LOCATION:  2303                                 FACILITY:  [facility]   PHYSICIAN:  Evelene Croon, M.D.                  DATE OF BIRTH:  November 08, 1932   DATE OF PROCEDURE:  07/27/2004  DATE OF DISCHARGE:                                 OPERATIVE REPORT   PREOPERATIVE DIAGNOSIS:  Severe three-vessel coronary artery disease.   POSTOPERATIVE DIAGNOSIS:  Severe three-vessel coronary artery disease.   OPERATION PERFORMED:  1.  Median sternotomy.  2.  Extracorporal circulation.  3.  Coronary artery bypass surgery times five using a left internal mammary      artery to the left anterior descending coronary artery, with a saphenous      vein graft to the second diagonal branch of the left anterior      descending, sequential saphenous vein graft to the first and second      obtuse marginal branches of the left circumflex coronary artery, and a      saphenous vein graft to the distal right coronary artery.  4.  Right coronary artery endarterectomy.  5.  Endoscopic vein harvesting from the right leg.   SURGEON:  Evelene Croon, M.D.   FIRST ASSISTANT:  Salvatore Decent. Dorris Fetch, M.D.   SECOND ASSISTANT:  Coral Ceo, P. A.   ANESTHESIA:  General endotracheal.   CLINICAL HISTORY:  This patient is a 77 year old gentleman who was admitted  with a non-ST segment elevation MI at [facility] in [place] in  June 2005.  He underwent cardiac catheterization and PTCA there; and, was  found to have an occluded second obtuse marginal branch and it was stented  with a Cypher stent.  During the same procedure he also had an angioplasty  of the first obtuse marginal branch.  Ejection fraction was 45% with  posterior akinesis.  He was felt to have residual significant coronary  disease  that included moderate LAD and left circumflex lesions as well as a  diffusely diseased right coronary artery.  Since the procedure was performed  he has continued to have fatigue and weakness as well as exertional  substernal chest pain and pressure radiating into both shoulders.  These  symptoms have been worsening; and, on my repeat cardiac catheterization  after a stress test showed posterolateral infarct with residual inferior  ischemia.  This catheterization showed significant three-vessel disease.  The LAD had 40-50% proximal stenosis.  There was a 90% proximal stenosis of  a small first diagonal branch.  The second diagonal had about 70% proximal  stenosis.  This was a moderate-sized vessel.  The left circumflex gave off a  small first marginal, which had about 50% proximal stenosis in the second  marginal branch and had  70% in-stent restenosis.  There is about 90%  stenosis of a small posterolateral branch.  The right coronary artery was  diffusely diseased with 70% ostial stenosis, 80% midvessel stenosis and 90%  distal stenosis before the posterior descending and posterolateral branches,  which were relatively small.  An attempt was made to perform angioplasty and  rotational atherectomy on the right coronary artery, but the lesion could  not be dilated.  Intravascular ultrasound showed circumferential  calcification.  Left ventricular ejection fraction was 60% with inferior  hypokinesis.   After review of the angiogram and examination of the patient it was felt  that coronary artery bypass graft surgery was best treatment to prevent  further treatment and infarction.  I discussed the operative procedure with  the patient and his wife including alternatives, benefits and risks  including bleeding, blood transfusion, infection, stroke, myocardial  infarction, graft ______, and death.  They understood and agreed to proceed.   DESCRIPTION OF OPERATION:  The patient was brought to the operating room and  placed on the table in the supine position.   After induction of general  endotracheal anesthesia a Foley catheter was placed in the bladder using  sterile technique.  Then the chest, abdomen and both lower extremities were  prepped and draped in the usual sterile manner.   The chest was entered through a median sternotomy incision.  The pericardium  opened in the midline.  Examination of the heart showed good ventricular  contractility.  The ascending aorta had no palpable plaques in it.   Then the left internal mammary artery was harvested from the chest wall as a  pedicle graft.  This was a medium caliber vessel with excellent blood flow  through it.  At the same time a segment of the greater saphenous vein was  harvested from the right leg using endoscopic vein harvest technique.  This  vein was of medium caliber and good quality.   Then the patient was heparinized and when an adequate clotting time was  achieved the distal ascending aorta was cannulated using a 20 French aortic  cannula for arterial input.  Venous outflow was achieved using a two-stage  venous cannula through the right atrial appendage; and, antegrade  cardioplegia and vent cannula were inserted in the aortic root.  The patient  was placed on cardiopulmonary bypass and the distal coronaries were  identified.   The LAD was a large graftable vessel that had mild diffuse distal disease in  it.  The first diagonal was a very small nongraftable vessel.  The second  diagonal branch was a moderate-sized graftable vessel.  The first marginal  was a moderate sized graftable vessel.  The second marginal was a large  vessel.  The distal left circumflex terminated as a third marginal or a  posterolateral branch, which was small and not graftable.  The right  coronary artery was diffusely diseased with calcific plaque and this  extended out into the proximal portions of the posterior descending and posterolateral branches.  The posterior descending and posterolateral   branches themselves were small vessels that were lying beneath the large  epicardial veins and were really not suitable for grafting.  Therefore, I  made plans to perform a distal right coronary endarterectomy to allow  grafting of the right coronary artery.   Then the aorta was crossclamped and 500 mL of cold blood antegrade  cardioplegia was administered in the aortic root with quick arrest of the  heart.  Systemic hypothermia to 20 degrees centigrade and topical  hypothermia  with iced saline was used.  The temperature probe was placed in  the septum __________ pericardium.   The first distal anastomosis was performed to the first marginal branch.  The internal diameter was 1.6 mm.  The conduit used was a segment of the  greater saphenous vein. The anastomosis performed in a sequential side-to-  side manner using continuous 7-0 Prolene suture.  Flow was measured through  the graft and was excellent.   The second distal anastomosis was performed to the second marginal branch.  The internal diameter was about 2 mm.  The conduit used was the same segment  of the greater saphenous vein.  The anastomosis was performed in a  sequential end-to-side manner using continuous 7-0 Prolene suture.  The  __________ wrapped; it was excellent.  Noninvasive cardioplegia was given  down the vein graft and the aortic root.   The third distal anastomosis was then performed to the second diagonal  branch.  The internal diameter of this vessel was about 1.6 mm.  The conduit  used was a second segment of the greater saphenous vein; and, the  anastomosis was performed in an end-to-side manner using continuous 7-0  Prolene suture.  Flow was measured through the graft and was excellent.   Then the right coronary artery endarterectomy was performed.  The right  coronary was opened distally just before the bifurcation into the posterior  descending and posterolateral branches.  The calcified plaque was  separated  from the adventitia using endarterectomy instruments.  This resulted in a  nice plane of dissection. The plaque was then transected and the proximal  portion was traced proximally to the midportion of the  right coronary  artery.  There appeared to be no good ending site and therefore the plaque  was transected  in approximately the mid right coronary artery.  Then the  distal plaque was dissected from the arterial wall using the endarterectomy  instruments and this was continued down into the posterior descending and  posterolateral branches, and resulted in a nice ending site with all the  plaque being removed without difficulty.   Then the fourth distal anastomosis was performed to the right coronary  artery using a piece of saphenous vein and anastomosing it in an end-to-side  manner using continuous 7-0 Prolene suture.  Flow was measured through this  graft and was excellent  Then the fifth distal anastomosis was performed to the mid-to-distal portion  of the left anterior descending coronary artery.  The internal diameter was  about 2 mm. The conduit used was the left internal mammary graft, which was  anastomosed in an end-to-side manner using continuous 8-0 Prolene suture.  The pedicle was tacked to the epicardium with 6-0 Prolene sutures.   The patient was rewarmed to 37 degrees centigrade and the clamp removed from  the area of the pedicle.  The graft was warming.  The ventricular septum  returned to spontaneous ventricular fibrillation.  The crossclamp was  removed with time of 82 minutes and the patient converted to sinus rhythm.  Partial occlusion clamps placed in the aortic root and the three proximal  vein graft anastomoses were performed in an end-to-side manner using  continuous 6-0 Prolene suture.  The clamp was removed.  The vein graft  deaired and clamped  ___________.  The proximal and distal anastomoses  appeared hemostatic as well as the graft being  satisfactory.  Graft markers  were placed from the proximal anastomosis.  Two temporary right ventricular  and right atrial pacing wires were placed and brought out through the skin.   When the patient had rewarmed to 37 degrees centigrade he was weaned from  cardiopulmonary bypass on no inotropic agents.  Total bypass time was 136  minutes.  Cardiac function appeared excellent with a cardiac output of 5  liters in the end.  Protamine was given intravenously and the aortic  cannulae were removed without difficulty.  Hemostasis was achieved.  Three  chest tubes were placed with tubing in the posterior pericardium along the  left  _____________ and one in the anterior mediastinum.  The pericardium  was loosely reapproximated over the heart.  The sternum was closed with #6  stainless steel wires.  The fascia was closed with continuous #1 Vicryl  suture.  The subcutaneous tissue was closed with continuous 2-0 Vicryl and  the skin with 3-0 Vicryl subcuticular closure.   The lower extremity vein harvest site was closed in layers in a similar  manner.   The sponge, needle and instrument counts were correct according to the scrub  nurse.   Dry sterile dressings were applied over the incisions around the chest tube  followed by suction.   The patient remained hemodynamically stable and was transported to the SICU  in guarded, but stable condition.                                               Evelene Croon, M.D.    BB/MEDQ  D:  07/27/2004  T:  07/29/2004  Job:  [id number]

## 2011-05-23 ENCOUNTER — Telehealth: Payer: Self-pay | Admitting: *Deleted

## 2011-05-23 MED ORDER — NITROGLYCERIN 0.4 MG SL SUBL: 0.4000 mg | SUBLINGUAL_TABLET | SUBLINGUAL | Status: DC | PRN

## 2011-05-23 MED ORDER — ATORVASTATIN CALCIUM 40 MG PO TABS: 40.0000 mg | ORAL_TABLET | Freq: Every day | ORAL | Status: DC

## 2011-05-23 NOTE — Telephone Encounter (Signed)
Needs Rf's (90 day supply) - DONE

## 2011-06-18 ENCOUNTER — Telehealth: Payer: Self-pay | Admitting: Cardiology

## 2011-06-18 MED ORDER — CLOPIDOGREL BISULFATE 75 MG PO TABS: 75.0000 mg | ORAL_TABLET | Freq: Every day | ORAL | Status: DC

## 2011-06-18 NOTE — Telephone Encounter (Signed)
..   Requested Prescriptions   Signed Prescriptions Disp Refills  . clopidogrel (PLAVIX) 75 MG tablet 30 tablet 11    Sig: Take 1 tablet (75 mg total) by mouth daily.    Authorizing Provider: DE Karma Lew    Ordering User: Lacie Scotts

## 2011-07-18 ENCOUNTER — Encounter: Payer: Self-pay | Admitting: *Deleted

## 2011-07-19 ENCOUNTER — Ambulatory Visit (INDEPENDENT_AMBULATORY_CARE_PROVIDER_SITE_OTHER): Payer: Medicare Other | Admitting: Cardiology

## 2011-07-19 ENCOUNTER — Encounter: Payer: Self-pay | Admitting: Cardiology

## 2011-07-19 VITALS — BP 160/79 | HR 52 | Ht 67.0 in | Wt 159.0 lb

## 2011-07-19 DIAGNOSIS — Z951 Presence of aortocoronary bypass graft: Secondary | ICD-10-CM

## 2011-07-19 DIAGNOSIS — E785 Hyperlipidemia, unspecified: Secondary | ICD-10-CM

## 2011-07-19 DIAGNOSIS — R0989 Other specified symptoms and signs involving the circulatory and respiratory systems: Secondary | ICD-10-CM

## 2011-07-19 DIAGNOSIS — I2510 Atherosclerotic heart disease of native coronary artery without angina pectoris: Secondary | ICD-10-CM

## 2011-07-19 DIAGNOSIS — G4700 Insomnia, unspecified: Secondary | ICD-10-CM

## 2011-07-19 DIAGNOSIS — Z79899 Other long term (current) drug therapy: Secondary | ICD-10-CM

## 2011-07-19 DIAGNOSIS — I10 Essential (primary) hypertension: Secondary | ICD-10-CM

## 2011-07-19 MED ORDER — ZOLPIDEM TARTRATE 10 MG PO TABS: 10.0000 mg | ORAL_TABLET | Freq: Every day | ORAL | Status: DC

## 2011-07-19 MED ORDER — TRAZODONE HCL 50 MG PO TABS: 50.0000 mg | ORAL_TABLET | Freq: Every day | ORAL | Status: DC

## 2011-07-19 NOTE — Assessment & Plan Note (Signed)
Lipid panel and LFTs will be drawn.

## 2011-07-19 NOTE — Assessment & Plan Note (Signed)
No definite carotid bruits on exam. Continue risk factor modification

## 2011-07-19 NOTE — Assessment & Plan Note (Signed)
Patient hypertensive in the office but he monitors his blood pressures at home very carefully and they are within normal limits

## 2011-07-19 NOTE — Patient Instructions (Signed)
Continue all current medications. Your physician wants you to follow up in:  1 year.  You will receive a reminder letter in the mail one-two months in advance.  If you don't receive a letter, please call our office to schedule the follow up appointment   

## 2011-07-19 NOTE — Progress Notes (Signed)
HPI The patient is a very pleasant 77 year old male with a history of coronary artery disease, status post coronary bypass grafting 2005. The patient is a chronic right bundle branch block. 2 years ago he had a stress echocardiogram with baseline ejection fraction 55% with inferoseptal inferobasal hypokinesis. The patient remains very active. He goes to the gym 6 days a week. He also does to one half a mile walks on the treadmill. Blood pressure strongly elevated today but he states that his blood pressure levels are normal at home. The patient also remains on dual antiplatelet therapy and reports. EKG today showed normal sinus rhythm at chronic right bundle branch block with no acute changes. Bedside echocardiogram performed today demonstrates an ejection fraction 55% with basal/midinferior and basal posterior hypokinesis/akinesis with scar tissue. This was noted before on echocardiogram. Carotid Dopplers were done by his primary care physician and showed 0-39% bilateral ICA stenosis with a recommendation to followup in 2 years  Allergies  Allergen Reactions  . Amoxicillin     REACTION: unspecified  . Ezetimibe     REACTION: numbness  . Niacin     REACTION: CP  . Penicillins   . Pravastatin Sodium   . Rosuvastatin     REACTION: aches    Current Outpatient Prescriptions on File Prior to Visit  Medication Sig Dispense Refill  . alfuzosin (UROXATRAL) 10 MG 24 hr tablet Take 10 mg by mouth daily. Hold when taking flomax       . aspirin 81 MG tablet Take 81 mg by mouth daily.        [name] atenolol (TENORMIN) 50 MG tablet Take 1 tablet (50 mg total) by mouth daily.  90 tablet  3  . Cholecalciferol (VITAMIN D3) 1000 UNITS CAPS Take 1 capsule by mouth daily.        . clopidogrel (PLAVIX) 75 MG tablet Take 1 tablet (75 mg total) by mouth daily.  30 tablet  11  . diazepam (VALIUM) 5 MG tablet Take 5 mg by mouth at bedtime as needed.        . fish oil-omega-3 fatty acids 1000 MG capsule Take 2 g by  mouth 2 (two) times daily.        . Multiple Vitamin (MULTIVITAMIN) tablet Take 1 tablet by mouth daily.        . nitroGLYCERIN (NITROSTAT) 0.4 MG SL tablet Place 1 tablet (0.4 mg total) under the tongue every 5 (five) minutes as needed for chest pain.  25 tablet  3  . ranitidine (ZANTAC) 150 MG tablet Take 75-150 mg by mouth daily as needed.         Past Medical History  Diagnosis Date  . Coronary artery disease     s/p coronary bypass grafting 2005  . GERD (gastroesophageal reflux disease)   . Hyperlipidemia   . Hypertension   . BPH (benign prostatic hypertrophy)     elevated PSA Dr. Lindell Noe Bx 2010    Past Surgical History  Procedure Date  . Cardiac catheterization 2007    with patent graft anatomy atretic left internal mammary  artery to the LAD which is nonobstructive. Will restart study June 08, 2007  . Coronary artery bypass graft 2005  . Lumbar fusion 09/2007    Family History  Problem Relation Age of Onset  . Coronary artery disease Other     male 1st degree relative<60    History   Social History  . Marital Status: Married    Spouse Name: N/A  Number of Children: N/A  . Years of Education: N/A   Occupational History  . Not on file.   Social History Main Topics  . Smoking status: Never Smoker   . Smokeless tobacco: Never Used  . Alcohol Use: Not on file  . Drug Use: Not on file  . Sexually Active: Not on file   Other Topics Concern  . Not on file   Social History Narrative  . No narrative on file   AVW:[id number] positives as outlined above. The remainder of the 18  point review of systems is negative  PHYSICAL EXAM BP 160/79  Pulse 52  Ht 5\' 7"  (1.702 m)  Wt 159 lb (72.122 kg)  BMI 24.90 kg/m2  General: Well-developed, well-nourished in no distress Head: Normocephalic and atraumatic Eyes:PERRLA/EOMI intact, conjunctiva and lids normal Ears: No deformity or lesions Mouth:normal dentition, normal posterior pharynx Neck: Supple, no JVD.   No masses, thyromegaly or abnormal cervical nodes Lungs: Normal breath sounds bilaterally without wheezing.  Normal percussion Cardiac: regular rate and rhythm with normal S1 and S2, no S3 or S4.  PMI is normal.  No pathological murmurs Abdomen: Normal bowel sounds, abdomen is soft and nontender without masses, organomegaly or hernias noted.  No hepatosplenomegaly MSK: Back normal, normal gait muscle strength and tone normal Vascular: Pulse is normal in all 4 extremities Extremities: No peripheral pitting edema Neurologic: Alert and oriented x 3 Skin: Intact without lesions or rashes Lymphatics: No significant adenopathy Psychologic: Normal affect  ECG: Normal sinus rhythm right bundle branch block  ASSESSMENT AND PLAN

## 2011-07-19 NOTE — Assessment & Plan Note (Signed)
I've given the patient a refill on his Ambien, but after discussing some of the symptoms that she may benefit from trazodone a little bit better and is willing to give this a try. I prescribed trazodone 50 mg by mouth each bedtime, but I told the patient that he experienced side effects of the medication is ineffective for him he can resume back Ambien.

## 2011-07-19 NOTE — Assessment & Plan Note (Signed)
No recurrent chest pain. Continue exercise and therapeutic lifestyle changes. Bedside echocardiogram revealed preserved LV function with small area of scar as outlined above.

## 2011-07-31 ENCOUNTER — Ambulatory Visit: Payer: Medicare Other

## 2011-07-31 DIAGNOSIS — E785 Hyperlipidemia, unspecified: Secondary | ICD-10-CM

## 2011-07-31 LAB — HEPATIC FUNCTION PANEL
ALT: 18 U/L (ref 0–53)
AST: 30 U/L (ref 0–37)
Albumin: 3.9 g/dL (ref 3.5–5.2)
Alkaline Phosphatase: 74 U/L (ref 39–117)
Bilirubin, Direct: 0.1 mg/dL (ref 0.0–0.3)
Total Bilirubin: 0.8 mg/dL (ref 0.3–1.2)
Total Protein: 7.8 g/dL (ref 6.0–8.3)

## 2011-07-31 LAB — LIPID PANEL
Cholesterol: 168 mg/dL (ref 0–200)
HDL: 54.1 mg/dL (ref >39.00)
LDL Cholesterol: 102 mg/dL — ABNORMAL HIGH (ref 0–99)
Total CHOL/HDL Ratio: 3
Triglycerides: 61 mg/dL (ref 0.0–149.0)
VLDL: 12.2 mg/dL (ref 0.0–40.0)

## 2011-08-28 ENCOUNTER — Ambulatory Visit: Payer: Medicare Other | Admitting: Cardiology

## 2011-09-10 LAB — POCT CARDIAC MARKERS
CKMB, poc: 1.7
Myoglobin, poc: 133
Operator id: 285841
Troponin i, poc: <0.05

## 2011-09-10 LAB — CBC
HCT: 38.2 — ABNORMAL LOW
Hemoglobin: 12.9 — ABNORMAL LOW
MCHC: 33.8
MCV: 90.5
Platelets: 171
RBC: 4.22
RDW: 13.2
WBC: 8.4

## 2011-09-10 LAB — DIFFERENTIAL
Basophils Absolute: 0
Basophils Relative: 0
Eosinophils Absolute: 0.1
Eosinophils Relative: 1
Lymphocytes Relative: 17
Lymphs Abs: 1.4
Monocytes Absolute: 0.4
Monocytes Relative: 5
Neutro Abs: 6.4
Neutrophils Relative %: 77

## 2011-09-10 LAB — LIPID PANEL
Cholesterol: 113
Cholesterol: 115
HDL: 42
HDL: 45
LDL Cholesterol: 57
LDL Cholesterol: 60
Total CHOL/HDL Ratio: 2.5
Total CHOL/HDL Ratio: 2.7
Triglycerides: 53
Triglycerides: 66
VLDL: 11
VLDL: 13

## 2011-09-10 LAB — BASIC METABOLIC PANEL
BUN: 15
BUN: 18
CO2: 25
CO2: 29
Calcium: 9.5
Calcium: 9.6
Chloride: 103
Chloride: 103
Creatinine, Ser: 1.02
Creatinine, Ser: 1.11
GFR calc Af Amer: >60
GFR calc Af Amer: >60
GFR calc non Af Amer: >60
GFR calc non Af Amer: >60
Glucose, Bld: 104 — ABNORMAL HIGH
Glucose, Bld: 98
Potassium: 4.3
Potassium: 4.5
Sodium: 136
Sodium: 136

## 2011-09-10 LAB — I-STAT 8, (EC8 V) (CONVERTED LAB)
BUN: 19
Bicarbonate: 26.7 — ABNORMAL HIGH
Chloride: 102
Glucose, Bld: 101 — ABNORMAL HIGH
HCT: 41
Hemoglobin: 13.9
Operator id: 285841
Potassium: 5.1
Sodium: 134 — ABNORMAL LOW
TCO2: 28
pCO2, Ven: 51.7 — ABNORMAL HIGH
pH, Ven: 7.321 — ABNORMAL HIGH

## 2011-09-10 LAB — CARDIAC PANEL(CRET KIN+CKTOT+MB+TROPI)
CK, MB: 2.1
CK, MB: 2.3
Relative Index: 1.8
Relative Index: 1.9
Total CK: 116
Total CK: 123
Troponin I: 0.01
Troponin I: 0.01

## 2011-09-10 LAB — COMPREHENSIVE METABOLIC PANEL
ALT: 20
AST: 23
Albumin: 3.7
Alkaline Phosphatase: 65
BUN: 18
CO2: 26
Calcium: 9.7
Chloride: 101
Creatinine, Ser: 1.14
GFR calc Af Amer: >60
GFR calc non Af Amer: >60
Glucose, Bld: 102 — ABNORMAL HIGH
Potassium: 4.8
Sodium: 135
Total Bilirubin: 1
Total Protein: 7.2

## 2011-09-10 LAB — POCT I-STAT CREATININE
Creatinine, Ser: 1.3
Operator id: 285841

## 2011-09-10 LAB — CK TOTAL AND CKMB (NOT AT ARMC)
CK, MB: 2.3
Relative Index: 1.9
Total CK: 124

## 2011-09-10 LAB — PROTIME-INR
INR: 1.1
Prothrombin Time: 14.2

## 2011-09-10 LAB — APTT: aPTT: 30

## 2011-09-10 LAB — TROPONIN I: Troponin I: 0.01

## 2011-09-10 LAB — MAGNESIUM
Magnesium: 2.1
Magnesium: 2.1

## 2011-11-26 DIAGNOSIS — Z95810 Presence of automatic (implantable) cardiac defibrillator: Secondary | ICD-10-CM

## 2011-11-26 HISTORY — DX: Presence of automatic (implantable) cardiac defibrillator: Z95.810

## 2011-12-18 ENCOUNTER — Other Ambulatory Visit (INDEPENDENT_AMBULATORY_CARE_PROVIDER_SITE_OTHER): Payer: Medicare Other

## 2011-12-18 ENCOUNTER — Other Ambulatory Visit: Payer: Self-pay | Admitting: *Deleted

## 2011-12-18 DIAGNOSIS — E785 Hyperlipidemia, unspecified: Secondary | ICD-10-CM

## 2011-12-18 DIAGNOSIS — N320 Bladder-neck obstruction: Secondary | ICD-10-CM | POA: Diagnosis not present

## 2011-12-18 DIAGNOSIS — I2510 Atherosclerotic heart disease of native coronary artery without angina pectoris: Secondary | ICD-10-CM

## 2011-12-18 DIAGNOSIS — Z0000 Encounter for general adult medical examination without abnormal findings: Secondary | ICD-10-CM

## 2011-12-18 DIAGNOSIS — Z0389 Encounter for observation for other suspected diseases and conditions ruled out: Secondary | ICD-10-CM

## 2011-12-18 LAB — HEPATIC FUNCTION PANEL
ALT: 26 U/L (ref 0–53)
AST: 29 U/L (ref 0–37)
Albumin: 3.5 g/dL (ref 3.5–5.2)
Alkaline Phosphatase: 76 U/L (ref 39–117)
Bilirubin, Direct: 0.1 mg/dL (ref 0.0–0.3)
Total Bilirubin: 1 mg/dL (ref 0.3–1.2)
Total Protein: 6.7 g/dL (ref 6.0–8.3)

## 2011-12-18 LAB — LIPID PANEL
Cholesterol: 141 mg/dL (ref 0–200)
HDL: 43.8 mg/dL (ref >39.00)
LDL Cholesterol: 85 mg/dL (ref 0–99)
Total CHOL/HDL Ratio: 3
Triglycerides: 59 mg/dL (ref 0.0–149.0)
VLDL: 11.8 mg/dL (ref 0.0–40.0)

## 2011-12-18 LAB — CBC WITH DIFFERENTIAL/PLATELET
Basophils Absolute: 0 10*3/uL (ref 0.0–0.1)
Basophils Relative: 0.2 % (ref 0.0–3.0)
Eosinophils Absolute: 0.3 10*3/uL (ref 0.0–0.7)
Eosinophils Relative: 3.8 % (ref 0.0–5.0)
HCT: 38.3 % — ABNORMAL LOW (ref 39.0–52.0)
Hemoglobin: 13.1 g/dL (ref 13.0–17.0)
Lymphocytes Relative: 27.6 % (ref 12.0–46.0)
Lymphs Abs: 2 10*3/uL (ref 0.7–4.0)
MCHC: 34.3 g/dL (ref 30.0–36.0)
MCV: 90.7 fl (ref 78.0–100.0)
Monocytes Absolute: 0.4 10*3/uL (ref 0.1–1.0)
Monocytes Relative: 6.3 % (ref 3.0–12.0)
Neutro Abs: 4.5 10*3/uL (ref 1.4–7.7)
Neutrophils Relative %: 62.1 % (ref 43.0–77.0)
Platelets: 155 10*3/uL (ref 150.0–400.0)
RBC: 4.22 Mil/uL (ref 4.22–5.81)
RDW: 13.2 % (ref 11.5–14.6)
WBC: 7.2 10*3/uL (ref 4.5–10.5)

## 2011-12-18 LAB — URINALYSIS, ROUTINE W REFLEX MICROSCOPIC
Bilirubin Urine: NEGATIVE
Hgb urine dipstick: NEGATIVE
Ketones, ur: NEGATIVE
Leukocytes, UA: NEGATIVE
Nitrite: NEGATIVE
Specific Gravity, Urine: 1.015 (ref 1.000–1.030)
Total Protein, Urine: NEGATIVE
Urine Glucose: NEGATIVE
Urobilinogen, UA: 0.2 (ref 0.0–1.0)
pH: 7 (ref 5.0–8.0)

## 2011-12-18 LAB — TSH: TSH: 1.48 u[IU]/mL (ref 0.35–5.50)

## 2011-12-18 LAB — PSA: PSA: 8.26 ng/mL — ABNORMAL HIGH (ref 0.10–4.00)

## 2011-12-18 LAB — BASIC METABOLIC PANEL
BUN: 12 mg/dL (ref 6–23)
CO2: 28 mEq/L (ref 19–32)
Calcium: 9.9 mg/dL (ref 8.4–10.5)
Chloride: 103 mEq/L (ref 96–112)
Creatinine, Ser: 1 mg/dL (ref 0.4–1.5)
GFR: 80.69 mL/min (ref >60.00)
Glucose, Bld: 95 mg/dL (ref 70–99)
Potassium: 4.4 mEq/L (ref 3.5–5.1)
Sodium: 137 mEq/L (ref 135–145)

## 2011-12-18 NOTE — Progress Notes (Signed)
ABN signed by patient/Pt informed of Medicare guidelines. ABN to be scanned.

## 2011-12-23 DIAGNOSIS — D1800 Hemangioma unspecified site: Secondary | ICD-10-CM | POA: Diagnosis not present

## 2011-12-23 DIAGNOSIS — D239 Other benign neoplasm of skin, unspecified: Secondary | ICD-10-CM | POA: Diagnosis not present

## 2011-12-23 DIAGNOSIS — L570 Actinic keratosis: Secondary | ICD-10-CM | POA: Diagnosis not present

## 2011-12-23 DIAGNOSIS — L821 Other seborrheic keratosis: Secondary | ICD-10-CM | POA: Diagnosis not present

## 2011-12-26 ENCOUNTER — Ambulatory Visit (INDEPENDENT_AMBULATORY_CARE_PROVIDER_SITE_OTHER): Payer: Medicare Other | Admitting: Internal Medicine

## 2011-12-26 ENCOUNTER — Encounter: Payer: Self-pay | Admitting: Internal Medicine

## 2011-12-26 VITALS — BP 110/60 | HR 72 | Temp 97.2°F | Resp 16 | Ht 68.0 in | Wt 156.0 lb

## 2011-12-26 DIAGNOSIS — E785 Hyperlipidemia, unspecified: Secondary | ICD-10-CM

## 2011-12-26 DIAGNOSIS — Z0000 Encounter for general adult medical examination without abnormal findings: Secondary | ICD-10-CM | POA: Diagnosis not present

## 2011-12-26 DIAGNOSIS — Z23 Encounter for immunization: Secondary | ICD-10-CM | POA: Diagnosis not present

## 2011-12-26 DIAGNOSIS — I2510 Atherosclerotic heart disease of native coronary artery without angina pectoris: Secondary | ICD-10-CM

## 2011-12-26 DIAGNOSIS — I10 Essential (primary) hypertension: Secondary | ICD-10-CM

## 2011-12-26 DIAGNOSIS — K219 Gastro-esophageal reflux disease without esophagitis: Secondary | ICD-10-CM

## 2011-12-26 MED ORDER — DIAZEPAM 5 MG PO TABS: 2.5000 mg | ORAL_TABLET | Freq: Two times a day (BID) | ORAL | Status: DC | PRN

## 2011-12-26 MED ORDER — CLOTRIMAZOLE-BETAMETHASONE 1-0.05 % EX CREA: TOPICAL_CREAM | Freq: Two times a day (BID) | CUTANEOUS | Status: DC

## 2011-12-26 MED ORDER — CLOPIDOGREL BISULFATE 75 MG PO TABS: 75.0000 mg | ORAL_TABLET | Freq: Every day | ORAL | Status: DC

## 2011-12-26 NOTE — Assessment & Plan Note (Signed)
Continue with current prescription therapy as reflected on the Med list.  

## 2011-12-26 NOTE — Progress Notes (Signed)
  Subjective:    Patient ID: [name] Christensen, male    DOB: Nov 05, 1932, 78 y.o.   MRN: [id number]  HPI  The patient is here for a wellness exam. The patient has been doing well overall without major physical or psychological issues going on lately.  The patient presents for a follow-up of  chronic BPH, hypertension, chronic dyslipidemia,CAD controlled with medicines   Review of Systems  Constitutional: Negative for appetite change, fatigue and unexpected weight change.  HENT: Negative for nosebleeds, congestion, sore throat, sneezing, trouble swallowing and neck pain.   Eyes: Negative for itching and visual disturbance.  Respiratory: Negative for cough.   Cardiovascular: Negative for chest pain, palpitations and leg swelling.  Gastrointestinal: Negative for nausea, diarrhea, blood in stool and abdominal distention.  Genitourinary: Negative for frequency and hematuria.  Musculoskeletal: Negative for back pain, joint swelling and gait problem.  Skin: Negative for rash.  Neurological: Negative for dizziness, tremors, speech difficulty and weakness.  Psychiatric/Behavioral: Negative for sleep disturbance, dysphoric mood and agitation. The patient is not nervous/anxious.        Objective:   Physical Exam  Constitutional: He is oriented to person, place, and time. He appears well-developed and well-nourished. No distress.  HENT:  Head: Normocephalic and atraumatic.  Right Ear: External ear normal.  Left Ear: External ear normal.  Nose: Nose normal.  Mouth/Throat: Oropharynx is clear and moist. No oropharyngeal exudate.  Eyes: Conjunctivae and EOM are normal. Pupils are equal, round, and reactive to light. Right eye exhibits no discharge. Left eye exhibits no discharge. No scleral icterus.  Neck: Normal range of motion. Neck supple. No JVD present. No tracheal deviation present. No thyromegaly present.  Cardiovascular: Normal rate, regular rhythm, normal heart sounds and intact distal  pulses.  Exam reveals no gallop and no friction rub.   No murmur heard. Pulmonary/Chest: Effort normal and breath sounds normal. No stridor. No respiratory distress. He has no wheezes. He has no rales. He exhibits no tenderness.  Abdominal: Soft. Bowel sounds are normal. He exhibits no distension and no mass. There is no tenderness. There is no rebound and no guarding.  Genitourinary: Rectum normal, prostate normal and penis normal. Guaiac negative stool. No penile tenderness.  Musculoskeletal: Normal range of motion. He exhibits no edema and no tenderness.  Lymphadenopathy:    He has no cervical adenopathy.  Neurological: He is alert and oriented to person, place, and time. He has normal reflexes. No cranial nerve deficit. He exhibits normal muscle tone. Coordination normal.  Skin: Skin is warm and dry. No rash noted. He is not diaphoretic. No erythema. No pallor.  Psychiatric: He has a normal mood and affect. His behavior is normal. Judgment and thought content normal.          Assessment & Plan:

## 2011-12-29 DIAGNOSIS — Z0000 Encounter for general adult medical examination without abnormal findings: Secondary | ICD-10-CM | POA: Insufficient documentation

## 2011-12-29 NOTE — Assessment & Plan Note (Signed)
The patient is here for annual Medicare wellness examination and management of other chronic and acute problems.   The risk factors are reflected in the social history.  The roster of all physicians providing medical care to patient - is listed in the Snapshot section of the chart.  Activities of daily living:  The patient is 100% inedpendent in all ADLs: dressing, toileting, feeding as well as independent mobility  Home safety : The patient has smoke detectors in the home. They wear seatbelts.No firearms at home ( firearms are present in the home, kept in a safe fashion). There is no violence in the home.   There is no risks for hepatitis, STDs or HIV. There is no   history of blood transfusion. They have no travel history to infectious disease endemic areas of the world.  The patient has (has not) seen their dentist in the last six month. They have (not) seen their eye doctor in the last year. They deny (admit to) any hearing difficulty and have not had audiologic testing in the last year.  They do not  have excessive sun exposure. Discussed the need for sun protection: hats, long sleeves and use of sunscreen if there is significant sun exposure.   Diet: the importance of a healthy diet is discussed. They do have a healthy (unhealthy-high fat/fast food) diet.  The patient has a regular exercise program: ____1___ , __h__duration, ___6__per week.  The benefits of regular aerobic exercise were discussed.  Depression screen: there are no signs or vegative symptoms of depression- irritability, change in appetite, anhedonia, sadness/tearfullness.  Cognitive assessment: the patient manages all their financial and personal affairs and is actively engaged. They could relate day,date,year and events; recalled 3/3 objects at 3 minutes; performed clock-face test normally.  The following portions of the patient's history were reviewed and updated as appropriate: allergies, current medications, past family  history, past medical history,  past surgical history, past social history  and problem list.  Vision, hearing, body mass index were assessed and reviewed.   During the course of the visit the patient was educated and counseled about appropriate screening and preventive services including : fall prevention , diabetes screening, nutrition counseling, colorectal cancer screening, and recommended immunizations. Colonoscopy for screening pros/cons at his age were discussed Zostavax discussed

## 2012-02-17 ENCOUNTER — Other Ambulatory Visit: Payer: Self-pay | Admitting: *Deleted

## 2012-02-17 MED ORDER — ATENOLOL 50 MG PO TABS: 50.0000 mg | ORAL_TABLET | Freq: Every day | ORAL | Status: DC

## 2012-03-30 DIAGNOSIS — N138 Other obstructive and reflux uropathy: Secondary | ICD-10-CM | POA: Diagnosis not present

## 2012-03-30 DIAGNOSIS — N401 Enlarged prostate with lower urinary tract symptoms: Secondary | ICD-10-CM | POA: Diagnosis not present

## 2012-03-30 DIAGNOSIS — R972 Elevated prostate specific antigen [PSA]: Secondary | ICD-10-CM | POA: Diagnosis not present

## 2012-05-11 DIAGNOSIS — J019 Acute sinusitis, unspecified: Secondary | ICD-10-CM | POA: Diagnosis not present

## 2012-05-11 DIAGNOSIS — J209 Acute bronchitis, unspecified: Secondary | ICD-10-CM | POA: Diagnosis not present

## 2012-07-07 ENCOUNTER — Telehealth: Payer: Self-pay | Admitting: *Deleted

## 2012-07-07 DIAGNOSIS — R002 Palpitations: Secondary | ICD-10-CM

## 2012-07-07 DIAGNOSIS — I10 Essential (primary) hypertension: Secondary | ICD-10-CM

## 2012-07-07 DIAGNOSIS — I2510 Atherosclerotic heart disease of native coronary artery without angina pectoris: Secondary | ICD-10-CM

## 2012-07-07 DIAGNOSIS — E785 Hyperlipidemia, unspecified: Secondary | ICD-10-CM

## 2012-07-07 NOTE — Telephone Encounter (Signed)
Patient is requesting that lab work be specifically ordered by MD to have done at [place] office. Nurse informed patient that routine lab work could be ordered (FLP/LFT) and copy faxed to him to have done at location of his choice. Patient said he would like all and any orders to come specifically from his MD, then arranged at the [facility] office lab specifically. Nurse advised patient that request would be sent to MD and there wasn't any guarantee that this would be addressed before his appointment on 07/21/12. Patient verbalized understanding of plan.

## 2012-07-14 DIAGNOSIS — R972 Elevated prostate specific antigen [PSA]: Secondary | ICD-10-CM | POA: Diagnosis not present

## 2012-07-15 NOTE — Telephone Encounter (Signed)
Go ahead and order CBC, CMET, lipid panel ( does not have to be fasting) and TSH

## 2012-07-16 NOTE — Telephone Encounter (Signed)
Patient notified of below.  Orders put in.

## 2012-07-20 ENCOUNTER — Ambulatory Visit: Payer: Medicare Other

## 2012-07-20 DIAGNOSIS — I10 Essential (primary) hypertension: Secondary | ICD-10-CM

## 2012-07-20 DIAGNOSIS — I2510 Atherosclerotic heart disease of native coronary artery without angina pectoris: Secondary | ICD-10-CM

## 2012-07-20 DIAGNOSIS — R002 Palpitations: Secondary | ICD-10-CM

## 2012-07-20 DIAGNOSIS — E785 Hyperlipidemia, unspecified: Secondary | ICD-10-CM

## 2012-07-20 LAB — LIPID PANEL
Cholesterol: 141 mg/dL (ref 0–200)
HDL: 43.6 mg/dL (ref >39.00)
LDL Cholesterol: 86 mg/dL (ref 0–99)
Total CHOL/HDL Ratio: 3
Triglycerides: 55 mg/dL (ref 0.0–149.0)
VLDL: 11 mg/dL (ref 0.0–40.0)

## 2012-07-20 LAB — CBC
HCT: 39.8 % (ref 39.0–52.0)
Hemoglobin: 13.1 g/dL (ref 13.0–17.0)
MCHC: 32.9 g/dL (ref 30.0–36.0)
MCV: 92 fl (ref 78.0–100.0)
Platelets: 147 10*3/uL — ABNORMAL LOW (ref 150.0–400.0)
RBC: 4.32 Mil/uL (ref 4.22–5.81)
RDW: 13.2 % (ref 11.5–14.6)
WBC: 6.3 10*3/uL (ref 4.5–10.5)

## 2012-07-20 LAB — COMPREHENSIVE METABOLIC PANEL
ALT: 14 U/L (ref 0–53)
AST: 24 U/L (ref 0–37)
Albumin: 3.7 g/dL (ref 3.5–5.2)
Alkaline Phosphatase: 70 U/L (ref 39–117)
BUN: 13 mg/dL (ref 6–23)
CO2: 27 mEq/L (ref 19–32)
Calcium: 10.2 mg/dL (ref 8.4–10.5)
Chloride: 104 mEq/L (ref 96–112)
Creatinine, Ser: 0.9 mg/dL (ref 0.4–1.5)
GFR: 84.62 mL/min (ref >60.00)
Glucose, Bld: 87 mg/dL (ref 70–99)
Potassium: 5.4 mEq/L — ABNORMAL HIGH (ref 3.5–5.1)
Sodium: 138 mEq/L (ref 135–145)
Total Bilirubin: 0.6 mg/dL (ref 0.3–1.2)
Total Protein: 7 g/dL (ref 6.0–8.3)

## 2012-07-20 LAB — TSH: TSH: 1.85 u[IU]/mL (ref 0.35–5.50)

## 2012-07-21 ENCOUNTER — Ambulatory Visit: Payer: Medicare Other | Admitting: Cardiology

## 2012-07-22 ENCOUNTER — Telehealth: Payer: Self-pay | Admitting: *Deleted

## 2012-07-22 NOTE — Telephone Encounter (Signed)
Notes Recorded by Lesle Chris, LPN on 7/84/6962 at 2:21 PM Patient notified. Did previously have appointment, but got mixed up on his times. OV scheduled for 9/30 with GD. Patient aware. Notes Recorded by Lesle Chris, LPN on 9/52/8413 at 12:04 PM Left message to return call.

## 2012-07-22 NOTE — Telephone Encounter (Signed)
Message copied by Lesle Chris on Wed Jul 22, 2012  2:21 PM ------      Message from: Learta Codding      Created: Tue Jul 21, 2012  3:07 PM       Please notify the patient that his potassium is high at 5.4 avoid any potassium supplements. Decrease potassium intake in diet and keep well hydrated. Creatinine is within normal limits. Patient was on my schedule him not sure why he did not come. May want to ask him about this

## 2012-08-05 ENCOUNTER — Other Ambulatory Visit: Payer: Self-pay | Admitting: *Deleted

## 2012-08-05 MED ORDER — NITROGLYCERIN 0.4 MG SL SUBL: 0.4000 mg | SUBLINGUAL_TABLET | SUBLINGUAL | Status: DC | PRN

## 2012-08-14 ENCOUNTER — Telehealth: Payer: Self-pay

## 2012-08-14 MED ORDER — DIAZEPAM 5 MG PO TABS: 2.5000 mg | ORAL_TABLET | Freq: Two times a day (BID) | ORAL | Status: DC | PRN

## 2012-08-14 NOTE — Telephone Encounter (Signed)
Done

## 2012-08-14 NOTE — Telephone Encounter (Signed)
OK to fill this prescription with additional refills x2 Thank you!  

## 2012-08-14 NOTE — Telephone Encounter (Signed)
Please advise if ok to refill diazepam 5mg  for this pt thanks

## 2012-08-24 ENCOUNTER — Ambulatory Visit: Payer: Medicare Other | Admitting: Cardiology

## 2012-09-10 ENCOUNTER — Encounter: Payer: Medicare Other | Admitting: Cardiovascular Disease

## 2012-09-18 ENCOUNTER — Ambulatory Visit: Payer: Medicare Other | Admitting: Cardiology

## 2012-09-23 DIAGNOSIS — Z23 Encounter for immunization: Secondary | ICD-10-CM | POA: Diagnosis not present

## 2012-09-29 ENCOUNTER — Encounter: Payer: Self-pay | Admitting: Cardiovascular Disease

## 2012-09-29 ENCOUNTER — Other Ambulatory Visit: Payer: Self-pay | Admitting: Cardiology

## 2012-09-29 ENCOUNTER — Ambulatory Visit (INDEPENDENT_AMBULATORY_CARE_PROVIDER_SITE_OTHER): Payer: Medicare Other | Admitting: Cardiovascular Disease

## 2012-09-29 VITALS — BP 174/75 | HR 57 | Ht 68.0 in | Wt 157.0 lb

## 2012-09-29 DIAGNOSIS — E875 Hyperkalemia: Secondary | ICD-10-CM | POA: Diagnosis not present

## 2012-09-29 DIAGNOSIS — I10 Essential (primary) hypertension: Secondary | ICD-10-CM | POA: Diagnosis not present

## 2012-09-29 DIAGNOSIS — Z951 Presence of aortocoronary bypass graft: Secondary | ICD-10-CM

## 2012-09-29 DIAGNOSIS — E785 Hyperlipidemia, unspecified: Secondary | ICD-10-CM

## 2012-09-29 LAB — BASIC METABOLIC PANEL
BUN: 11 mg/dL (ref 6–23)
CO2: 30 mEq/L (ref 19–32)
Calcium: 10 mg/dL (ref 8.4–10.5)
Chloride: 98 mEq/L (ref 96–112)
Creatinine, Ser: 1 mg/dL (ref 0.4–1.5)
GFR: 81.5 mL/min (ref >60.00)
Glucose, Bld: 89 mg/dL (ref 70–99)
Potassium: 4.2 mEq/L (ref 3.5–5.1)
Sodium: 134 mEq/L — ABNORMAL LOW (ref 135–145)

## 2012-09-29 NOTE — Assessment & Plan Note (Signed)
Stable with no angina and good activity level.  Continue medical Rx CABG in 2006 [facility]

## 2012-09-29 NOTE — Progress Notes (Signed)
Patient ID: [name] Christensen, male   DOB: 1932-08-09, 78 y.o.   MRN: [id number] The patient is a very pleasant 78 year old male previously seen by Dr Degent   History of coronary artery disease, status post coronary bypass grafting 2005. The patient is a chronic right bundle branch block. 2 years ago he had a stress echocardiogram with baseline ejection fraction 55% with inferoseptal inferobasal hypokinesis.  The patient remains very active. He goes to the gym 6 days a week. He also does to one half a mile walks on the treadmill. Blood pressure strongly elevated today but he states that his blood pressure levels are normal at home.   The patient also remains on dual antiplatelet therapy and reports.   EKG today showed normal sinus rhythm at chronic right bundle branch block with no acute changes.   Bedside echocardiogram performed today demonstrates an ejection fraction 55% with basal/midinferior and basal posterior hypokinesis/akinesis with scar tissue. This was noted before on echocardiogram.  Carotid Dopplers were done by his primary care physician and showed 0-39% bilateral ICA stenosis with a recommendation to followup in 2 years   ROS: Denies fever, malais, weight loss, blurry vision, decreased visual acuity, cough, sputum, SOB, hemoptysis, pleuritic pain, palpitaitons, heartburn, abdominal pain, melena, lower extremity edema, claudication, or rash.  All other systems reviewed and negative   General: Affect appropriate Healthy:  appears stated age HEENT: normal Neck supple with no adenopathy JVP normal no bruits no thyromegaly Lungs clear with no wheezing and good diaphragmatic motion Heart:  S1/S2 no murmur,rub, gallop or click PMI normal Abdomen: benighn, BS positve, no tenderness, no AAA no bruit.  No HSM or HJR Distal pulses intact with no bruits No edema Neuro non-focal Skin warm and dry No muscular weakness  Medications Current Outpatient Prescriptions  Medication Sig  Dispense Refill  . alfuzosin (UROXATRAL) 10 MG 24 hr tablet Take 10 mg by mouth daily. Hold when taking flomax       . aspirin 81 MG tablet Take 81 mg by mouth daily.        [name] atenolol (TENORMIN) 50 MG tablet Take 1 tablet (50 mg total) by mouth daily.  90 tablet  3  . atorvastatin (LIPITOR) 40 MG tablet Take 20 mg by mouth daily.       . Cholecalciferol (VITAMIN D3) 1000 UNITS CAPS Take 1 capsule by mouth daily.        . clopidogrel (PLAVIX) 75 MG tablet Take 1 tablet (75 mg total) by mouth daily.  30 tablet  11  . clotrimazole-betamethasone (LOTRISONE) cream Apply topically 2 (two) times daily.  45 g  3  . diazepam (VALIUM) 5 MG tablet Take 0.5-1 tablets (2.5-5 mg total) by mouth every 12 (twelve) hours as needed.  30 tablet  2  . Docusate Sodium (STOOL SOFTENER) 100 MG capsule Take 100 mg by mouth daily as needed.      . fish oil-omega-3 fatty acids 1000 MG capsule Take 2 g by mouth 2 (two) times daily.        . Multiple Vitamin (MULTIVITAMIN) tablet Take 1 tablet by mouth daily.        . nitroGLYCERIN (NITROSTAT) 0.4 MG SL tablet Place 1 tablet (0.4 mg total) under the tongue every 5 (five) minutes as needed for chest pain.  25 tablet  3  . ranitidine (ZANTAC) 150 MG tablet Take 75-150 mg by mouth daily as needed.       . [DISCONTINUED] atorvastatin (LIPITOR) 40 MG tablet  Take 40 mg by mouth daily. Patient takes 1/2 20mg         Allergies Amoxicillin; Ezetimibe; Niacin; Penicillins; Pravastatin sodium; and Rosuvastatin  Family History: Family History  Problem Relation Age of Onset  . Coronary artery disease Other     male 1st degree relative<60    Social History: History   Social History  . Marital Status: Married    Spouse Name: N/A    Number of Children: N/A  . Years of Education: N/A   Occupational History  . Not on file.   Social History Main Topics  . Smoking status: Never Smoker   . Smokeless tobacco: Never Used  . Alcohol Use: Not on file  . Drug Use: Not on  file  . Sexually Active: Not on file   Other Topics Concern  . Not on file   Social History Narrative  . No narrative on file    Electrocardiogram:  Assessment and Plan

## 2012-09-29 NOTE — Assessment & Plan Note (Signed)
Cholesterol is at goal.  Continue current dose of statin and diet Rx.  No myalgias or side effects.  F/U  LFT's in 6 months. Lab Results  Component Value Date   LDLCALC 86 07/20/2012

## 2012-09-29 NOTE — Patient Instructions (Signed)
Your physician wants you to follow-up in:   6 MONTHS WITH DR Haywood Filler will receive a reminder letter in the mail two months in advance. If you don't receive a letter, please call our office to schedule the follow-up appointment. Your physician recommends that you continue on your current medications as directed. Please refer to the Current Medication list given to you today. Your physician recommends that you return for lab work in: BMET TODAY  DX HYPERKALEMIA

## 2012-09-29 NOTE — Addendum Note (Signed)
Addended by: Linzie Collin D on: 09/29/2012 11:32 AM   Modules accepted: Orders

## 2012-09-29 NOTE — Assessment & Plan Note (Signed)
Well controlled.  Continue current medications and low sodium Dash type diet.    

## 2012-09-29 NOTE — Assessment & Plan Note (Signed)
2 months ago and told by Dr Andee Lineman to change diet  This has been hard for wife.  Normal renal fucntion ? hemolyis Repeat BMET today

## 2012-10-26 DIAGNOSIS — R972 Elevated prostate specific antigen [PSA]: Secondary | ICD-10-CM | POA: Diagnosis not present

## 2012-10-26 DIAGNOSIS — N400 Enlarged prostate without lower urinary tract symptoms: Secondary | ICD-10-CM | POA: Diagnosis not present

## 2012-10-31 ENCOUNTER — Inpatient Hospital Stay (HOSPITAL_COMMUNITY): Payer: Medicare Other

## 2012-10-31 ENCOUNTER — Inpatient Hospital Stay (HOSPITAL_COMMUNITY)
Admission: AD | Admit: 2012-10-31 | Discharge: 2012-11-12 | DRG: 224 | Disposition: A | Discharge status: Rehabilitation Facility | Payer: Medicare Other | Source: Other Acute Inpatient Hospital | Attending: Internal Medicine | Admitting: Internal Medicine

## 2012-10-31 ENCOUNTER — Emergency Department: Payer: Self-pay | Admitting: Emergency Medicine

## 2012-10-31 DIAGNOSIS — I4510 Unspecified right bundle-branch block: Secondary | ICD-10-CM | POA: Diagnosis present

## 2012-10-31 DIAGNOSIS — I469 Cardiac arrest, cause unspecified: Secondary | ICD-10-CM | POA: Diagnosis not present

## 2012-10-31 DIAGNOSIS — Z95810 Presence of automatic (implantable) cardiac defibrillator: Secondary | ICD-10-CM | POA: Insufficient documentation

## 2012-10-31 DIAGNOSIS — R1310 Dysphagia, unspecified: Secondary | ICD-10-CM | POA: Diagnosis not present

## 2012-10-31 DIAGNOSIS — G931 Anoxic brain damage, not elsewhere classified: Secondary | ICD-10-CM | POA: Diagnosis not present

## 2012-10-31 DIAGNOSIS — I10 Essential (primary) hypertension: Secondary | ICD-10-CM | POA: Diagnosis present

## 2012-10-31 DIAGNOSIS — D649 Anemia, unspecified: Secondary | ICD-10-CM | POA: Diagnosis present

## 2012-10-31 DIAGNOSIS — Z452 Encounter for adjustment and management of vascular access device: Secondary | ICD-10-CM | POA: Diagnosis not present

## 2012-10-31 DIAGNOSIS — I5043 Acute on chronic combined systolic (congestive) and diastolic (congestive) heart failure: Secondary | ICD-10-CM | POA: Diagnosis not present

## 2012-10-31 DIAGNOSIS — Z981 Arthrodesis status: Secondary | ICD-10-CM

## 2012-10-31 DIAGNOSIS — I517 Cardiomegaly: Secondary | ICD-10-CM | POA: Diagnosis not present

## 2012-10-31 DIAGNOSIS — J9819 Other pulmonary collapse: Secondary | ICD-10-CM | POA: Diagnosis not present

## 2012-10-31 DIAGNOSIS — Z4682 Encounter for fitting and adjustment of non-vascular catheter: Secondary | ICD-10-CM | POA: Diagnosis not present

## 2012-10-31 DIAGNOSIS — I2510 Atherosclerotic heart disease of native coronary artery without angina pectoris: Secondary | ICD-10-CM | POA: Diagnosis present

## 2012-10-31 DIAGNOSIS — D696 Thrombocytopenia, unspecified: Secondary | ICD-10-CM | POA: Diagnosis present

## 2012-10-31 DIAGNOSIS — E785 Hyperlipidemia, unspecified: Secondary | ICD-10-CM | POA: Diagnosis present

## 2012-10-31 DIAGNOSIS — I4901 Ventricular fibrillation: Principal | ICD-10-CM | POA: Diagnosis present

## 2012-10-31 DIAGNOSIS — J90 Pleural effusion, not elsewhere classified: Secondary | ICD-10-CM | POA: Diagnosis not present

## 2012-10-31 DIAGNOSIS — Z79899 Other long term (current) drug therapy: Secondary | ICD-10-CM | POA: Diagnosis not present

## 2012-10-31 DIAGNOSIS — R6889 Other general symptoms and signs: Secondary | ICD-10-CM | POA: Diagnosis not present

## 2012-10-31 DIAGNOSIS — I2589 Other forms of chronic ischemic heart disease: Secondary | ICD-10-CM | POA: Diagnosis present

## 2012-10-31 DIAGNOSIS — Z5189 Encounter for other specified aftercare: Secondary | ICD-10-CM | POA: Diagnosis not present

## 2012-10-31 DIAGNOSIS — J189 Pneumonia, unspecified organism: Secondary | ICD-10-CM

## 2012-10-31 DIAGNOSIS — K219 Gastro-esophageal reflux disease without esophagitis: Secondary | ICD-10-CM | POA: Diagnosis present

## 2012-10-31 DIAGNOSIS — R7309 Other abnormal glucose: Secondary | ICD-10-CM | POA: Diagnosis present

## 2012-10-31 DIAGNOSIS — I519 Heart disease, unspecified: Secondary | ICD-10-CM | POA: Diagnosis not present

## 2012-10-31 DIAGNOSIS — M549 Dorsalgia, unspecified: Secondary | ICD-10-CM | POA: Diagnosis not present

## 2012-10-31 DIAGNOSIS — G9340 Encephalopathy, unspecified: Secondary | ICD-10-CM

## 2012-10-31 DIAGNOSIS — R918 Other nonspecific abnormal finding of lung field: Secondary | ICD-10-CM | POA: Diagnosis not present

## 2012-10-31 DIAGNOSIS — R141 Gas pain: Secondary | ICD-10-CM | POA: Diagnosis not present

## 2012-10-31 DIAGNOSIS — N400 Enlarged prostate without lower urinary tract symptoms: Secondary | ICD-10-CM | POA: Diagnosis present

## 2012-10-31 DIAGNOSIS — I25810 Atherosclerosis of coronary artery bypass graft(s) without angina pectoris: Secondary | ICD-10-CM | POA: Diagnosis present

## 2012-10-31 DIAGNOSIS — J988 Other specified respiratory disorders: Secondary | ICD-10-CM | POA: Diagnosis not present

## 2012-10-31 DIAGNOSIS — E871 Hypo-osmolality and hyponatremia: Secondary | ICD-10-CM | POA: Diagnosis not present

## 2012-10-31 DIAGNOSIS — R142 Eructation: Secondary | ICD-10-CM | POA: Diagnosis not present

## 2012-10-31 DIAGNOSIS — J9600 Acute respiratory failure, unspecified whether with hypoxia or hypercapnia: Secondary | ICD-10-CM | POA: Diagnosis not present

## 2012-10-31 DIAGNOSIS — E876 Hypokalemia: Secondary | ICD-10-CM | POA: Diagnosis not present

## 2012-10-31 DIAGNOSIS — Z7982 Long term (current) use of aspirin: Secondary | ICD-10-CM | POA: Diagnosis not present

## 2012-10-31 DIAGNOSIS — Z9889 Other specified postprocedural states: Secondary | ICD-10-CM | POA: Diagnosis not present

## 2012-10-31 DIAGNOSIS — I509 Heart failure, unspecified: Secondary | ICD-10-CM | POA: Diagnosis present

## 2012-10-31 DIAGNOSIS — R0989 Other specified symptoms and signs involving the circulatory and respiratory systems: Secondary | ICD-10-CM | POA: Diagnosis not present

## 2012-10-31 DIAGNOSIS — R0609 Other forms of dyspnea: Secondary | ICD-10-CM | POA: Diagnosis not present

## 2012-10-31 DIAGNOSIS — M79609 Pain in unspecified limb: Secondary | ICD-10-CM | POA: Diagnosis not present

## 2012-10-31 DIAGNOSIS — I679 Cerebrovascular disease, unspecified: Secondary | ICD-10-CM | POA: Diagnosis not present

## 2012-10-31 DIAGNOSIS — R5381 Other malaise: Secondary | ICD-10-CM | POA: Diagnosis not present

## 2012-10-31 DIAGNOSIS — R143 Flatulence: Secondary | ICD-10-CM | POA: Diagnosis not present

## 2012-10-31 DIAGNOSIS — J811 Chronic pulmonary edema: Secondary | ICD-10-CM | POA: Diagnosis not present

## 2012-10-31 LAB — CBC WITH DIFFERENTIAL/PLATELET
Basophil #: 0.1 10*3/uL (ref 0.0–0.1)
Basophil %: 0.7 %
Eosinophil #: 0.1 10*3/uL (ref 0.0–0.7)
Eosinophil %: 1.2 %
HCT: 34.2 % — ABNORMAL LOW (ref 40.0–52.0)
HGB: 11.4 g/dL — ABNORMAL LOW (ref 13.0–18.0)
Lymphocyte #: 3.5 10*3/uL (ref 1.0–3.6)
Lymphocyte %: 34.6 %
MCH: 30.4 pg (ref 26.0–34.0)
MCHC: 33.3 g/dL (ref 32.0–36.0)
MCV: 91 fL (ref 80–100)
Monocyte #: 0.4 x10 3/mm (ref 0.2–1.0)
Monocyte %: 3.6 %
Neutrophil #: 6.1 10*3/uL (ref 1.4–6.5)
Neutrophil %: 59.9 %
Platelet: 163 10*3/uL (ref 150–440)
RBC: 3.75 10*6/uL — ABNORMAL LOW (ref 4.40–5.90)
RDW: 12.8 % (ref 11.5–14.5)
WBC: 10.2 10*3/uL (ref 3.8–10.6)

## 2012-10-31 LAB — BASIC METABOLIC PANEL
BUN: 13 mg/dL (ref 6–23)
BUN: 13 mg/dL (ref 6–23)
BUN: 13 mg/dL (ref 6–23)
CO2: 21 mEq/L (ref 19–32)
CO2: 19 mEq/L (ref 19–32)
CO2: 19 mEq/L (ref 19–32)
Calcium: 7.8 mg/dL — ABNORMAL LOW (ref 8.4–10.5)
Calcium: 7.7 mg/dL — ABNORMAL LOW (ref 8.4–10.5)
Calcium: 7.9 mg/dL — ABNORMAL LOW (ref 8.4–10.5)
Chloride: 105 mEq/L (ref 96–112)
Chloride: 104 mEq/L (ref 96–112)
Chloride: 106 mEq/L (ref 96–112)
Creatinine, Ser: 0.87 mg/dL (ref 0.50–1.35)
Creatinine, Ser: 0.76 mg/dL (ref 0.50–1.35)
Creatinine, Ser: 0.74 mg/dL (ref 0.50–1.35)
GFR calc Af Amer: >90 mL/min (ref >90)
GFR calc Af Amer: >90 mL/min (ref >90)
GFR calc Af Amer: >90 mL/min (ref >90)
GFR calc non Af Amer: 81 mL/min — ABNORMAL LOW (ref >90)
GFR calc non Af Amer: 85 mL/min — ABNORMAL LOW (ref >90)
GFR calc non Af Amer: 86 mL/min — ABNORMAL LOW (ref >90)
Glucose, Bld: 121 mg/dL — ABNORMAL HIGH (ref 70–99)
Glucose, Bld: 118 mg/dL — ABNORMAL HIGH (ref 70–99)
Glucose, Bld: 119 mg/dL — ABNORMAL HIGH (ref 70–99)
Potassium: 3.5 mEq/L (ref 3.5–5.1)
Potassium: 3.2 mEq/L — ABNORMAL LOW (ref 3.5–5.1)
Potassium: 3 mEq/L — ABNORMAL LOW (ref 3.5–5.1)
Sodium: 134 mEq/L — ABNORMAL LOW (ref 135–145)
Sodium: 132 mEq/L — ABNORMAL LOW (ref 135–145)
Sodium: 134 mEq/L — ABNORMAL LOW (ref 135–145)

## 2012-10-31 LAB — PROTIME-INR
INR: 1.1
INR: 1.23 (ref 0.00–1.49)
Prothrombin Time: 14.2 secs (ref 11.5–14.7)
Prothrombin Time: 15.3 seconds — ABNORMAL HIGH (ref 11.6–15.2)

## 2012-10-31 LAB — COMPREHENSIVE METABOLIC PANEL
Albumin: 3.1 g/dL — ABNORMAL LOW (ref 3.4–5.0)
Alkaline Phosphatase: 117 U/L (ref 50–136)
Anion Gap: 10 (ref 7–16)
BUN: 13 mg/dL (ref 7–18)
Bilirubin,Total: 0.5 mg/dL (ref 0.2–1.0)
Calcium, Total: 8.5 mg/dL (ref 8.5–10.1)
Chloride: 103 mmol/L (ref 98–107)
Co2: 21 mmol/L (ref 21–32)
Creatinine: 1.28 mg/dL (ref 0.60–1.30)
EGFR (African American): >60
EGFR (Non-African Amer.): 53 — ABNORMAL LOW
Glucose: 172 mg/dL — ABNORMAL HIGH (ref 65–99)
Osmolality: 272 (ref 275–301)
Potassium: 3.8 mmol/L (ref 3.5–5.1)
SGOT(AST): 346 U/L — ABNORMAL HIGH (ref 15–37)
SGPT (ALT): 322 U/L — ABNORMAL HIGH (ref 12–78)
Sodium: 134 mmol/L — ABNORMAL LOW (ref 136–145)
Total Protein: 6.5 g/dL (ref 6.4–8.2)

## 2012-10-31 LAB — POCT I-STAT 3, ART BLOOD GAS (G3+)
Acid-base deficit: 6 mmol/L — ABNORMAL HIGH (ref 0.0–2.0)
Acid-base deficit: 8 mmol/L — ABNORMAL HIGH (ref 0.0–2.0)
Bicarbonate: 21.6 mEq/L (ref 20.0–24.0)
Bicarbonate: 18.4 mEq/L — ABNORMAL LOW (ref 20.0–24.0)
O2 Saturation: 92 %
O2 Saturation: 94 %
Patient temperature: 97.5
Patient temperature: 33
TCO2: 23 mmol/L (ref 0–100)
TCO2: 20 mmol/L (ref 0–100)
pCO2 arterial: 52 mmHg — ABNORMAL HIGH (ref 35.0–45.0)
pCO2 arterial: 33.7 mmHg — ABNORMAL LOW (ref 35.0–45.0)
pH, Arterial: 7.224 — ABNORMAL LOW (ref 7.350–7.450)
pH, Arterial: 7.325 — ABNORMAL LOW (ref 7.350–7.450)
pO2, Arterial: 74 mmHg — ABNORMAL LOW (ref 80.0–100.0)
pO2, Arterial: 62 mmHg — ABNORMAL LOW (ref 80.0–100.0)

## 2012-10-31 LAB — TROPONIN I
Troponin I: 0.48 ng/mL (ref <0.30)
Troponin I: 0.69 ng/mL (ref <0.30)
Troponin-I: <0.02 ng/mL

## 2012-10-31 LAB — GLUCOSE, CAPILLARY
Glucose-Capillary: 113 mg/dL — ABNORMAL HIGH (ref 70–99)
Glucose-Capillary: 124 mg/dL — ABNORMAL HIGH (ref 70–99)
Glucose-Capillary: 105 mg/dL — ABNORMAL HIGH (ref 70–99)
Glucose-Capillary: 111 mg/dL — ABNORMAL HIGH (ref 70–99)

## 2012-10-31 LAB — MRSA PCR SCREENING: MRSA by PCR: POSITIVE — AB

## 2012-10-31 LAB — HEPATIC FUNCTION PANEL
ALT: 239 U/L — ABNORMAL HIGH (ref 0–53)
AST: 267 U/L — ABNORMAL HIGH (ref 0–37)
Albumin: 2.7 g/dL — ABNORMAL LOW (ref 3.5–5.2)
Alkaline Phosphatase: 78 U/L (ref 39–117)
Bilirubin, Direct: 0.1 mg/dL (ref 0.0–0.3)
Indirect Bilirubin: 0.3 mg/dL (ref 0.3–0.9)
Total Bilirubin: 0.4 mg/dL (ref 0.3–1.2)
Total Protein: 5.4 g/dL — ABNORMAL LOW (ref 6.0–8.3)

## 2012-10-31 LAB — LACTIC ACID, PLASMA: Lactic Acid, Venous: 1.1 mmol/L (ref 0.5–2.2)

## 2012-10-31 LAB — HEPARIN LEVEL (UNFRACTIONATED): Heparin Unfractionated: 0.19 IU/mL — ABNORMAL LOW (ref 0.30–0.70)

## 2012-10-31 LAB — CK TOTAL AND CKMB (NOT AT ARMC)
CK, Total: 218 U/L (ref 35–232)
CK-MB: 4.1 ng/mL — ABNORMAL HIGH (ref 0.5–3.6)

## 2012-10-31 LAB — APTT
Activated PTT: 34.6 secs (ref 23.6–35.9)
aPTT: 58 seconds — ABNORMAL HIGH (ref 24–37)

## 2012-10-31 MED ORDER — PANTOPRAZOLE SODIUM 40 MG IV SOLR
40.0000 mg | INTRAVENOUS | Status: DC
  Administered 2012-10-31 – 2012-11-01 (×2): 40 mg via INTRAVENOUS
  Filled 2012-10-31 (×3): qty 40

## 2012-10-31 MED ORDER — ARTIFICIAL TEARS OP OINT
1.0000 "application " | TOPICAL_OINTMENT | Freq: Three times a day (TID) | OPHTHALMIC | Status: DC
  Administered 2012-10-31 – 2012-11-01 (×4): 1 via OPHTHALMIC
  Filled 2012-10-31: qty 3.5

## 2012-10-31 MED ORDER — CISATRACURIUM BOLUS VIA INFUSION
0.1000 mg/kg | Freq: Once | INTRAVENOUS | Status: DC
  Administered 2012-10-31: 7.7 mg via INTRAVENOUS
  Filled 2012-10-31: qty 8

## 2012-10-31 MED ORDER — FENTANYL CITRATE 0.05 MG/ML IJ SOLN: 100.0000 ug | Freq: Once | INTRAMUSCULAR | Status: DC

## 2012-10-31 MED ORDER — SODIUM CHLORIDE 0.9 % IV SOLN
INTRAVENOUS | Status: DC
  Administered 2012-10-31: 19:00:00 via INTRAVENOUS

## 2012-10-31 MED ORDER — MIDAZOLAM BOLUS VIA INFUSION
2.0000 mg | INTRAVENOUS | Status: DC | PRN
  Filled 2012-10-31: qty 2

## 2012-10-31 MED ORDER — CHLORHEXIDINE GLUCONATE CLOTH 2 % EX PADS
6.0000 | MEDICATED_PAD | Freq: Every day | CUTANEOUS | Status: AC
  Administered 2012-11-01 – 2012-11-05 (×5): 6 via TOPICAL

## 2012-10-31 MED ORDER — ASPIRIN 300 MG RE SUPP
300.0000 mg | RECTAL | Status: AC
  Filled 2012-10-31: qty 1

## 2012-10-31 MED ORDER — BIOTENE DRY MOUTH MT LIQD
15.0000 mL | Freq: Four times a day (QID) | OROMUCOSAL | Status: DC
  Administered 2012-11-01 – 2012-11-10 (×39): 15 mL via OROMUCOSAL

## 2012-10-31 MED ORDER — MUPIROCIN 2 % EX OINT
1.0000 "application " | TOPICAL_OINTMENT | Freq: Two times a day (BID) | CUTANEOUS | Status: AC
  Administered 2012-11-01 – 2012-11-05 (×10): 1 via NASAL
  Filled 2012-10-31 (×2): qty 22

## 2012-10-31 MED ORDER — NOREPINEPHRINE BITARTRATE 1 MG/ML IJ SOLN
2.0000 ug/min | INTRAVENOUS | Status: DC
  Administered 2012-10-31: 4 ug/min via INTRAVENOUS
  Administered 2012-11-01: 15 ug/min via INTRAVENOUS
  Filled 2012-10-31 (×4): qty 4

## 2012-10-31 MED ORDER — HEPARIN (PORCINE) IN NACL 100-0.45 UNIT/ML-% IJ SOLN
1200.0000 [IU]/h | INTRAMUSCULAR | Status: DC
  Administered 2012-10-31 (×2): 1150 [IU]/h via INTRAVENOUS
  Administered 2012-11-01: 1200 [IU]/h via INTRAVENOUS
  Filled 2012-10-31 (×3): qty 250

## 2012-10-31 MED ORDER — SODIUM CHLORIDE 0.9 % IV SOLN
25.0000 ug/h | INTRAVENOUS | Status: DC
  Administered 2012-10-31: 100 ug/h via INTRAVENOUS
  Administered 2012-10-31: 150 ug/h via INTRAVENOUS
  Administered 2012-11-02: 125 ug/h via INTRAVENOUS
  Filled 2012-10-31 (×3): qty 50

## 2012-10-31 MED ORDER — MIDAZOLAM HCL 5 MG/ML IJ SOLN
2.0000 mg | Freq: Once | INTRAMUSCULAR | Status: DC
  Filled 2012-10-31: qty 1

## 2012-10-31 MED ORDER — CISATRACURIUM BOLUS VIA INFUSION
0.0500 mg/kg | Freq: Once | INTRAVENOUS | Status: AC | PRN
  Filled 2012-10-31: qty 4

## 2012-10-31 MED ORDER — SODIUM CHLORIDE 0.9 % IV SOLN
INTRAVENOUS | Status: DC
  Administered 2012-10-31 – 2012-11-06 (×3): via INTRAVENOUS

## 2012-10-31 MED ORDER — SODIUM CHLORIDE 0.9 % IV SOLN
2000.0000 mL | Freq: Once | INTRAVENOUS | Status: AC
  Administered 2012-10-31: 2000 mL via INTRAVENOUS

## 2012-10-31 MED ORDER — SODIUM CHLORIDE 0.9 % IV SOLN
1.0000 ug/kg/min | INTRAVENOUS | Status: DC
  Administered 2012-10-31 (×2): 1 ug/kg/min via INTRAVENOUS
  Filled 2012-10-31: qty 20

## 2012-10-31 MED ORDER — POTASSIUM CHLORIDE 10 MEQ/50ML IV SOLN
10.0000 meq | INTRAVENOUS | Status: AC
  Administered 2012-10-31 (×2): 10 meq via INTRAVENOUS
  Filled 2012-10-31 (×2): qty 50

## 2012-10-31 MED ORDER — FENTANYL BOLUS VIA INFUSION
50.0000 ug | INTRAVENOUS | Status: DC | PRN
  Filled 2012-10-31: qty 50

## 2012-10-31 MED ORDER — MIDAZOLAM HCL 5 MG/ML IJ SOLN
1.0000 mg/h | INTRAMUSCULAR | Status: DC
  Administered 2012-10-31 – 2012-11-01 (×3): 4 mg/h via INTRAVENOUS
  Filled 2012-10-31 (×4): qty 10

## 2012-10-31 MED ORDER — MIDAZOLAM HCL 5 MG/ML IJ SOLN: 2.0000 mg | Freq: Once | INTRAMUSCULAR | Status: AC | PRN

## 2012-10-31 MED ORDER — METOPROLOL TARTRATE 1 MG/ML IV SOLN
5.0000 mg | INTRAVENOUS | Status: DC
  Administered 2012-10-31: 5 mg via INTRAVENOUS
  Filled 2012-10-31 (×8): qty 5

## 2012-10-31 MED ORDER — CHLORHEXIDINE GLUCONATE 0.12 % MT SOLN
15.0000 mL | Freq: Two times a day (BID) | OROMUCOSAL | Status: DC
  Administered 2012-10-31 – 2012-11-07 (×15): 15 mL via OROMUCOSAL
  Filled 2012-10-31 (×15): qty 15

## 2012-10-31 MED ORDER — INSULIN ASPART 100 UNIT/ML ~~LOC~~ SOLN
0.0000 [IU] | SUBCUTANEOUS | Status: DC
  Administered 2012-11-01 – 2012-11-03 (×9): 1 [IU] via SUBCUTANEOUS
  Administered 2012-11-04: 2 [IU] via SUBCUTANEOUS
  Administered 2012-11-04 – 2012-11-05 (×6): 1 [IU] via SUBCUTANEOUS
  Administered 2012-11-05: 2 [IU] via SUBCUTANEOUS
  Administered 2012-11-05: 13:00:00 via SUBCUTANEOUS
  Administered 2012-11-05 – 2012-11-06 (×3): 1 [IU] via SUBCUTANEOUS
  Administered 2012-11-06: 2 [IU] via SUBCUTANEOUS

## 2012-10-31 MED ORDER — FENTANYL CITRATE 0.05 MG/ML IJ SOLN: 100.0000 ug | Freq: Once | INTRAMUSCULAR | Status: AC | PRN

## 2012-10-31 NOTE — H&P (Signed)
PULMONARY  / CRITICAL CARE MEDICINE  Name: [name] Christensen MRN: [id number] DOB: 26-Nov-1931    LOS: 0  CHIEF COMPLAINT:  Cardiac Arrest  BRIEF PATIENT DESCRIPTION: 78 yo man, hx CAD/CABG, HTN. Witnessed out-of-hospital arrest, 15 min downtime. ECG with RBBB. Transferred from [facility] 12/7, hypothermia initiated.   LINES / TUBES: ETT 12/7 >>   CULTURES:  ANTIBIOTICS:  SIGNIFICANT EVENTS:  12/7 Out of hospital arrest, estimated 15 minutes down time  LEVEL OF CARE:  ICU PRIMARY SERVICE:  PCCM CONSULTANTS:  Cardiology CODE STATUS: Full DIET:  NPO DVT Px:  Heparin gtt GI Px:  protonix   HISTORY OF PRESENT ILLNESS:  78 yo man, hx CAD/CABG, HTN.  Last stress TTE reassuring, stable in 11/13 when evaluated by Dr [name]. Witnessed out-of-hospital arrest. Per family he has been unwell for a week, viral syndrome, poor Po intake. Pt was normal sitting at table, became incoherent, slumped forward.  15 min downtime. ECG with RBBB. Transferred from [facility] 12/7, hypothermia initiated.   PAST MEDICAL HISTORY :  Past Medical History  Diagnosis Date  . Coronary artery disease     s/p coronary bypass grafting 2005  . GERD (gastroesophageal reflux disease)   . Hyperlipidemia   . Hypertension   . BPH (benign prostatic hypertrophy)     elevated PSA Dr. Lindell Noe Bx 2010   Past Surgical History  Procedure Date  . Cardiac catheterization 2007    with patent graft anatomy atretic left internal mammary  artery to the LAD which is nonobstructive. Will restart study June 08, 2007  . Coronary artery bypass graft 2005  . Lumbar fusion 09/2007   Prior to Admission medications   Medication Sig Start Date End Date Taking? Authorizing Provider  alfuzosin (UROXATRAL) 10 MG 24 hr tablet Take 10 mg by mouth daily. Hold when taking flomax     Historical Provider, MD  aspirin 81 MG tablet Take 81 mg by mouth daily.      Historical Provider, MD  atenolol (TENORMIN) 50 MG tablet Take 1 tablet (50 mg total)  by mouth daily. 02/17/12   Georgina Quint Plotnikov, MD  atorvastatin (LIPITOR) 40 MG tablet Take 20 mg by mouth daily.     Historical Provider, MD  Cholecalciferol (VITAMIN D3) 1000 UNITS CAPS Take 1 capsule by mouth daily.      Historical Provider, MD  clopidogrel (PLAVIX) 75 MG tablet Take 1 tablet (75 mg total) by mouth daily. 12/26/11 12/25/12  Georgina Quint Plotnikov, MD  clotrimazole-betamethasone (LOTRISONE) cream Apply topically 2 (two) times daily. 12/26/11 12/25/12  Georgina Quint Plotnikov, MD  diazepam (VALIUM) 5 MG tablet Take 0.5-1 tablets (2.5-5 mg total) by mouth every 12 (twelve) hours as needed. 08/14/12   Georgina Quint Plotnikov, MD  Docusate Sodium (STOOL SOFTENER) 100 MG capsule Take 100 mg by mouth daily as needed.    Historical Provider, MD  fish oil-omega-3 fatty acids 1000 MG capsule Take 2 g by mouth 2 (two) times daily.      Historical Provider, MD  Multiple Vitamin (MULTIVITAMIN) tablet Take 1 tablet by mouth daily.      Historical Provider, MD  nitroGLYCERIN (NITROSTAT) 0.4 MG SL tablet Place 1 tablet (0.4 mg total) under the tongue every 5 (five) minutes as needed for chest pain. 08/05/12 08/05/13  Georgina Quint Plotnikov, MD  ranitidine (ZANTAC) 150 MG tablet Take 75-150 mg by mouth daily as needed.     Historical Provider, MD   Allergies  Allergen Reactions  . Amoxicillin  REACTION: unspecified  . Ezetimibe     REACTION: numbness  . Niacin     REACTION: CP  . Penicillins   . Pravastatin Sodium   . Rosuvastatin     REACTION: aches    FAMILY HISTORY:  Family History  Problem Relation Age of Onset  . Coronary artery disease Other     male 1st degree relative<60   SOCIAL HISTORY:  reports that he has never smoked. He has never used smokeless tobacco. His alcohol and drug histories not on file.  REVIEW OF SYSTEMS:  Unable to obtain, pt sedated and intubated  INTERVAL HISTORY:   VITAL SIGNS: Temp:  [95.9 F (35.5 C)-97.5 F (36.4 C)] 95.9 F (35.5 C) (12/07 1700) Pulse  Rate:  [73] 73  (12/07 1615) Resp:  [16] 16  (12/07 1615) BP: (192)/(85) 192/85 mmHg (12/07 1615) SpO2:  [96 %] 96 % (12/07 1615) FiO2 (%):  [100 %] 100 % (12/07 1633) Weight:  [77.1 kg (169 lb 15.6 oz)] 77.1 kg (169 lb 15.6 oz) (12/07 1615) HEMODYNAMICS:   VENTILATOR SETTINGS: Vent Mode:  [-] PRVC FiO2 (%):  [100 %] 100 % Set Rate:  [20 bmp] 20 bmp Vt Set:  [500 mL] 500 mL PEEP:  [5 cmH20] 5 cmH20 Plateau Pressure:  [18 cmH20] 18 cmH20 INTAKE / OUTPUT: Intake/Output    None     PHYSICAL EXAMINATION: General:  Ill appearing man, intubated and sedated Neuro:  Sedated and paralyzed HEENT:  ETT in place, OP clear, no LAD Cardiovascular: Regular, no M Lungs:  Clear B  Abdomen:  Soft, Non-distended  Musculoskeletal:  No  Skin:  Cool, no rash   LABS: Cbc No results found for this basename: WBC:,HGB:3,HCT:3,PLT:3 in the last 168 hours  Chemistry  No results found for this basename: NA:3,K:3,CL:3,CO2:3,BUN:3,CREATININE:3,CALCIUM:3,MG:3,PHOS:3,GLUCOSE:3 in the last 168 hours  Liver fxn No results found for this basename: AST:3,ALT:3,ALKPHOS:3,BILITOT:3,PROT:3,ALBUMIN:3 in the last 168 hours coags No results found for this basename: APTT:3,INR:3 in the last 168 hours Sepsis markers No results found for this basename: LATICACIDVEN:3,PROCALCITON:3 in the last 168 hours Cardiac markers No results found for this basename: CKTOTAL:3,CKMB:3,TROPONINI:3 in the last 168 hours BNP No results found for this basename: PROBNP:3 in the last 168 hours ABG  Lab 11/22/2012 1616  PHART 7.224*  PCO2ART 52.0*  PO2ART 74.0*  HCO3 21.6  TCO2 23    CBG trend  Lab 10/28/2012 1705  GLUCAP 113*    IMAGING: CXR 12/7 >> B edema pattern  ECG:  DIAGNOSES: Active Problems:  HYPERLIPIDEMIA  HYPERTENSION  CORONARY ARTERY DISEASE  CORONARY ARTERY BYPASS GRAFT, HX OF  Sudden cardiac death   ASSESSMENT / PLAN:  PULMONARY  ASSESSMENT: VDRF due to cardiac arrest PLAN:   - PRVC -  assess for SBT's after rewarming and MS eval  CARDIOVASCULAR  ASSESSMENT: Cardiac arrest, suspect primary ventricular arrhythmia (since AED shocked him) although this would be less likely in absence of a cardiomyopathy. ? Possible viral syndrome, ? Viral myocarditis in a pt with severe underlying coronary disease. ECG w RBBB (old). No STEMI or indication for emergent cath.   PLAN:  - heparin gtt for presumed ACS - hypothermia protocol initiated - ASA - metoprolol  RENAL  ASSESSMENT:  S Cr 1.28 (? Baseline)  PLAN:   - follow BMP  GASTROINTESTINAL  ASSESSMENT:  Transaminitis, ? EtOH use PLAN:   - follow LFT, check hepatitis panel  - PPI for GI prophylaxis  HEMATOLOGIC  ASSESSMENT:   PLAN:  - follow CBC  INFECTIOUS  ASSESSMENT:  No clear source infxn PLAN:   - follow CBC, Pct - no empiric abx but low threshold to start if clinical change  ENDOCRINE  ASSESSMENT:  hyperglycemia   PLAN:   - ICU HG protocol  NEUROLOGIC  ASSESSMENT:  Encephalopathy PLAN:   - Sedated and paralyzed - reassess MS once re-warmed   I have personally obtained a history, examined the patient, evaluated laboratory and imaging results, formulated the assessment and plan and placed orders. CRITICAL CARE: The patient is critically ill with multiple organ systems failure and requires high complexity decision making for assessment and support, frequent evaluation and titration of therapies, application of advanced monitoring technologies and extensive interpretation of multiple databases. Critical Care Time devoted to patient care services described in this note is 90 minutes.    Levy Pupa, MD, PhD 11/16/2012, 6:28 PM [facility] [phone] or if no answer [phone]

## 2012-10-31 NOTE — Progress Notes (Signed)
eLink Physician-Brief Progress Note Patient Name: [name] Christensen DOB: 1932-04-05 MRN: [id number]  Date of Service  11/03/2012   HPI/Events of Note   ABG reviewed   eICU Interventions  Rate increased to 20; repeat ABG in 1h      Lourdes Kucharski 11/13/2012, 4:27 PM

## 2012-10-31 NOTE — Consult Note (Signed)
CARDIOLOGY CONSULT NOTE  Patient ID: [name] Christensen MRN: [id number] DOB/AGE: June 27, 1932 78 y.o.  Admit date: 11/14/2012  Primary PhysicianAlex Plotnikov, MD Primary Cardiologist    Nishan    HPI:  The patient has known coronary disease. He underwent CABG in 2005. In 2007 cardiac catheterization was done. The grafts were patent. The LIMA to the LAD was atretic. However the LAD itself was nonobstructive. It is my understanding that the patient had a stress echo within approximately the past 2 years showing no significant abnormalities. The underlying ejection fraction is 55%. The patient saw Dr. [name] in the office in November, 2013. He was stable at that time. He exercises on a very regular basis.  Today he had sudden cardiac death. From the history available, the patient was actually at the dinner table. His wife was present. By report he began to babble incoherently. He then slumped forward.. No external compression was given. The emergency medical services arrived In the range of 5-10 minutes. It was thought that he was pulseless. AED was placed and a shock was advised. With one shock the patient had sinus rhythm and return of a pulse. He did return to sinus rhythm. He was then transported to [facility] Cooling with ice in the armpits in the groin was started. A formal cooling protocol was started here as soon as he arrived.   His EKG reveals old right bundle branch block. There is no acute ST elevation.    Review of systems:    The patient is unresponsive and sedated.  Past Medical History  Diagnosis Date  . Coronary artery disease     s/p coronary bypass grafting 2005  . GERD (gastroesophageal reflux disease)   . Hyperlipidemia   . Hypertension   . BPH (benign prostatic hypertrophy)     elevated PSA Dr. Lindell Noe Bx 2010    Family History  Problem Relation Age of Onset  . Coronary artery disease Other     male 1st degree relative<60    History   Social  History  . Marital Status: Married    Spouse Name: N/A    Number of Children: N/A  . Years of Education: N/A   Occupational History  . Not on file.   Social History Main Topics  . Smoking status: Never Smoker   . Smokeless tobacco: Never Used  . Alcohol Use: Not on file  . Drug Use: Not on file  . Sexually Active: Not on file   Other Topics Concern  . Not on file   Social History Narrative  . No narrative on file    Past Surgical History  Procedure Date  . Cardiac catheterization 2007    with patent graft anatomy atretic left internal mammary  artery to the LAD which is nonobstructive. Will restart study June 08, 2007  . Coronary artery bypass graft 2005  . Lumbar fusion 09/2007     Prescriptions prior to admission  Medication Sig Dispense Refill  . alfuzosin (UROXATRAL) 10 MG 24 hr tablet Take 10 mg by mouth daily. Hold when taking flomax       . aspirin 81 MG tablet Take 81 mg by mouth daily.        [name] atenolol (TENORMIN) 50 MG tablet Take 1 tablet (50 mg total) by mouth daily.  90 tablet  3  . atorvastatin (LIPITOR) 40 MG tablet Take 20 mg by mouth daily.       . Cholecalciferol (VITAMIN D3) 1000 UNITS CAPS  Take 1 capsule by mouth daily.        . clopidogrel (PLAVIX) 75 MG tablet Take 1 tablet (75 mg total) by mouth daily.  30 tablet  11  . clotrimazole-betamethasone (LOTRISONE) cream Apply topically 2 (two) times daily.  45 g  3  . diazepam (VALIUM) 5 MG tablet Take 0.5-1 tablets (2.5-5 mg total) by mouth every 12 (twelve) hours as needed.  30 tablet  2  . Docusate Sodium (STOOL SOFTENER) 100 MG capsule Take 100 mg by mouth daily as needed.      . fish oil-omega-3 fatty acids 1000 MG capsule Take 2 g by mouth 2 (two) times daily.        . Multiple Vitamin (MULTIVITAMIN) tablet Take 1 tablet by mouth daily.        . nitroGLYCERIN (NITROSTAT) 0.4 MG SL tablet Place 1 tablet (0.4 mg total) under the tongue every 5 (five) minutes as needed for chest pain.  25 tablet  3  .  ranitidine (ZANTAC) 150 MG tablet Take 75-150 mg by mouth daily as needed.         Physical Exam: Height 5\' 8"  (1.727 m), weight 169 lb 15.6 oz (77.1 kg).    The patient is intubated and sedated. Lung exam reveals rhonchi. Cardiac exam reveals S1 and S2. There no clicks or significant murmurs. He has no significant peripheral edema.  Labs:     Lab Results  Component Value Date   CHOL 141 07/20/2012   CHOL 141 12/18/2011   CHOL 168 07/31/2011   Lab Results  Component Value Date   HDL 43.60 07/20/2012   HDL 16.10 12/18/2011   HDL 96.04 07/31/2011   Lab Results  Component Value Date   LDLCALC 86 07/20/2012   LDLCALC 85 12/18/2011   LDLCALC 102* 07/31/2011   Lab Results  Component Value Date   TRIG 55.0 07/20/2012   TRIG 59.0 12/18/2011   TRIG 61.0 07/31/2011   Lab Results  Component Value Date   CHOLHDL 3 07/20/2012   CHOLHDL 3 12/18/2011   CHOLHDL 3 07/31/2011   No results found for this basename: LDLDIRECT      Radiology:  EKG: I have reviewed the EKG from [place] and the EKG here. There is old right bundle branch block. There is no evidence of ST elevation.  ASSESSMENT AND PLAN:    Active Problems:    CORONARY ARTERY DISEASE     Historically he has done very well. He underwent CABG in 2005. In 2007 cardiac catheterization revealed that his LIMA was atretic. However the LAD was nonobstructive. His other grafts were patent. He exercises 6 days per week. He has not had any significant problems. He was stable when he was seen in the office in November, 2013. We are not aware of any recent symptoms.   CORONARY ARTERY BYPASS GRAFT, HX OF   Sudden cardiac death    It appears that the patient suffered sudden cardiac death today. I describe the events at the beginning of this note. By history the patient was babbling incoherently briefly before he passed out. I cannot document how long this behavior lasted before he passed out. This seems a little unusual for a ventricular fibrillation  arrest. The information that follows is that external compression was not given by the family. When EMS arrived he was pulseless.  AED was placed and he is advised to give her shock. The equipment gave the shock and the rhythm was in sinus with a pulse.  With no ST elevation there is no indication to go to the cath lab. The patient is being managed by the critical care team with sedation and cooling. For now we will follow his cardiac status. He is on IV heparin. I have decided to stop this until I discuss the case with the critical care team. It may be most prudent to proceed with a CT scan of the head before any more heparin was given.Jerral Bonito, MD

## 2012-10-31 NOTE — Progress Notes (Signed)
eLink Physician-Brief Progress Note Patient Name: [name] Christensen DOB: 06-Feb-1932 MRN: [id number]  Date of Service  10/26/2012   HPI/Events of Note  Stress ulcer prophylaxis   eICU Interventions  Protonix ordered      [facility] 11/12/2012, 7:18 PM

## 2012-10-31 NOTE — Procedures (Signed)
Arterial Catheter Insertion Procedure Note [name] Christensen [id number] 10-04-1932  Procedure: Insertion of Arterial Catheter  Indications: Blood pressure monitoring  Procedure Details Consent: Risks of procedure as well as the alternatives and risks of each were explained to the (patient/caregiver).  Consent for procedure obtained. Time Out: Verified patient identification, verified procedure, site/side was marked, verified correct patient position, special equipment/implants available, medications/allergies/relevent history reviewed, required imaging and test results available.  Performed  Maximum sterile technique was used including antiseptics. Skin prep: Chlorhexidine; local anesthetic administered 20 gauge catheter was inserted into right radial artery using the Seldinger technique.  Evaluation Blood flow good; BP tracing good. Complications: No apparent complications.   [name] Christensen 11/24/2012

## 2012-10-31 NOTE — Progress Notes (Signed)
eLink Physician-Brief Progress Note Patient Name: [name] Christensen DOB: 04/13/1932 MRN: [id number]  Date of Service  11/10/2012   HPI/Events of Note  1.Hypokalemia   2. Glycemic control   eICU Interventions  1. K replaced;   2. SSI ordered      [facility] 11/12/2012, 9:32 PM

## 2012-10-31 NOTE — Progress Notes (Addendum)
ANTICOAGULATION CONSULT NOTE - Initial Consult  Pharmacy Consult for Heparin Indication: ACS/STEMI  Allergies  Allergen Reactions  . Amoxicillin     REACTION: unspecified  . Ezetimibe     REACTION: numbness  . Niacin     REACTION: CP  . Penicillins   . Pravastatin Sodium   . Rosuvastatin     REACTION: aches    Patient Measurements: Height: 5\' 11"  (180.3 cm) Weight: 169 lb 15.6 oz (77.1 kg) IBW/kg (Calculated) : 75.3  Heparin Dosing Weight: 77 kg  Vital Signs: Temp: 91 F (32.8 C) (12/07 1830) Temp src: Core (Comment) (12/07 1830) BP: 140/53 mmHg (12/07 1932) Pulse Rate: 56  (12/07 1932)  Labs: 12/7 from [facility]:   H/H 11.4/34.2, PLTC 163K     Basename 11/16/2012 1805  HGB --  HCT --  PLT --  APTT 58*  LABPROT 15.3*  INR 1.23  HEPARINUNFRC --  CREATININE 0.87  CKTOTAL --  CKMB --  TROPONINI 0.48*    Estimated Creatinine Clearance: 74.5 ml/min (by C-G formula based on Cr of 0.87).   Medical History: Past Medical History  Diagnosis Date  . Coronary artery disease     s/p coronary bypass grafting 2005  . GERD (gastroesophageal reflux disease)   . Hyperlipidemia   . Hypertension   . BPH (benign prostatic hypertrophy)     elevated PSA Dr. Lindell Noe Bx 2010   Assessment:   78 yr old man transferred to [facility] from [facility] today after cardiac arrest. Hypothermia protocol initiated.  Heparin was begun at [place] at 2:22 pm today with 4000 units IV bolus then 1150 units/hr infusion.  Some discussion regarding holding heparin until head CT could be completed, but decision was to continue Heparin. Head CT to be done when possible.  Goal of Therapy:  Heparin level 0.3-0.7 units/ml Monitor platelets by anticoagulation protocol: Yes   Plan:   Continue heparin drip at 1150 units/hr.  Heparin level tonight, about 6 hours after drip begun.  Daily heparin level and CBC while on heparin.  Follow up head CT.  Dennie Fetters, [place] Pager:  [phone] 11/05/2012,7:38 PM  Addendum:  - Heparin level is 0.19 on 1150 units/hr. Below target.  - Will increase heparin drip to 1400 units/hr.  - next heparin level in ~ 6 hrs.  - follow-up head CT.  Hilarie Fredrickson, RPh 11/09/2012 9:15pm

## 2012-11-01 ENCOUNTER — Encounter (HOSPITAL_COMMUNITY): Payer: Self-pay | Admitting: *Deleted

## 2012-11-01 ENCOUNTER — Inpatient Hospital Stay (HOSPITAL_COMMUNITY): Payer: Medicare Other

## 2012-11-01 DIAGNOSIS — M549 Dorsalgia, unspecified: Secondary | ICD-10-CM

## 2012-11-01 DIAGNOSIS — I517 Cardiomegaly: Secondary | ICD-10-CM

## 2012-11-01 DIAGNOSIS — K219 Gastro-esophageal reflux disease without esophagitis: Secondary | ICD-10-CM

## 2012-11-01 LAB — POCT I-STAT 3, ART BLOOD GAS (G3+)
Acid-base deficit: 9 mmol/L — ABNORMAL HIGH (ref 0.0–2.0)
Acid-base deficit: 9 mmol/L — ABNORMAL HIGH (ref 0.0–2.0)
Bicarbonate: 20.8 mEq/L (ref 20.0–24.0)
Bicarbonate: 19.5 mEq/L — ABNORMAL LOW (ref 20.0–24.0)
O2 Saturation: 92 %
O2 Saturation: 88 %
Patient temperature: 33.1
Patient temperature: 33.1
TCO2: 23 mmol/L (ref 0–100)
TCO2: 21 mmol/L (ref 0–100)
pCO2 arterial: 52.2 mmHg — ABNORMAL HIGH (ref 35.0–45.0)
pCO2 arterial: 42.9 mmHg (ref 35.0–45.0)
pH, Arterial: 7.185 — CL (ref 7.350–7.450)
pH, Arterial: 7.244 — ABNORMAL LOW (ref 7.350–7.450)
pO2, Arterial: 65 mmHg — ABNORMAL LOW (ref 80.0–100.0)
pO2, Arterial: 52 mmHg — ABNORMAL LOW (ref 80.0–100.0)

## 2012-11-01 LAB — URINALYSIS, ROUTINE W REFLEX MICROSCOPIC
Glucose, UA: NEGATIVE mg/dL
Ketones, ur: 15 mg/dL — AB
Nitrite: NEGATIVE
Protein, ur: 100 mg/dL — AB
Specific Gravity, Urine: 1.022 (ref 1.005–1.030)
Urobilinogen, UA: 1 mg/dL (ref 0.0–1.0)
pH: 5.5 (ref 5.0–8.0)

## 2012-11-01 LAB — BASIC METABOLIC PANEL
BUN: 13 mg/dL (ref 6–23)
BUN: 13 mg/dL (ref 6–23)
BUN: 12 mg/dL (ref 6–23)
BUN: 12 mg/dL (ref 6–23)
BUN: 12 mg/dL (ref 6–23)
BUN: 11 mg/dL (ref 6–23)
CO2: 18 mEq/L — ABNORMAL LOW (ref 19–32)
CO2: 17 mEq/L — ABNORMAL LOW (ref 19–32)
CO2: 20 mEq/L (ref 19–32)
CO2: 22 mEq/L (ref 19–32)
CO2: 22 mEq/L (ref 19–32)
CO2: 19 mEq/L (ref 19–32)
Calcium: 8 mg/dL — ABNORMAL LOW (ref 8.4–10.5)
Calcium: 7.9 mg/dL — ABNORMAL LOW (ref 8.4–10.5)
Calcium: 8 mg/dL — ABNORMAL LOW (ref 8.4–10.5)
Calcium: 7.9 mg/dL — ABNORMAL LOW (ref 8.4–10.5)
Calcium: 7.5 mg/dL — ABNORMAL LOW (ref 8.4–10.5)
Calcium: 7.4 mg/dL — ABNORMAL LOW (ref 8.4–10.5)
Chloride: 105 mEq/L (ref 96–112)
Chloride: 106 mEq/L (ref 96–112)
Chloride: 105 mEq/L (ref 96–112)
Chloride: 103 mEq/L (ref 96–112)
Chloride: 102 mEq/L (ref 96–112)
Chloride: 103 mEq/L (ref 96–112)
Creatinine, Ser: 0.76 mg/dL (ref 0.50–1.35)
Creatinine, Ser: 0.7 mg/dL (ref 0.50–1.35)
Creatinine, Ser: 0.79 mg/dL (ref 0.50–1.35)
Creatinine, Ser: 0.71 mg/dL (ref 0.50–1.35)
Creatinine, Ser: 0.79 mg/dL (ref 0.50–1.35)
Creatinine, Ser: 0.77 mg/dL (ref 0.50–1.35)
GFR calc Af Amer: >90 mL/min (ref >90)
GFR calc Af Amer: >90 mL/min (ref >90)
GFR calc Af Amer: >90 mL/min (ref >90)
GFR calc Af Amer: >90 mL/min (ref >90)
GFR calc Af Amer: >90 mL/min (ref >90)
GFR calc Af Amer: >90 mL/min (ref >90)
GFR calc non Af Amer: 85 mL/min — ABNORMAL LOW (ref >90)
GFR calc non Af Amer: 88 mL/min — ABNORMAL LOW (ref >90)
GFR calc non Af Amer: 84 mL/min — ABNORMAL LOW (ref >90)
GFR calc non Af Amer: 88 mL/min — ABNORMAL LOW (ref >90)
GFR calc non Af Amer: 84 mL/min — ABNORMAL LOW (ref >90)
GFR calc non Af Amer: 85 mL/min — ABNORMAL LOW (ref >90)
Glucose, Bld: 120 mg/dL — ABNORMAL HIGH (ref 70–99)
Glucose, Bld: 119 mg/dL — ABNORMAL HIGH (ref 70–99)
Glucose, Bld: 120 mg/dL — ABNORMAL HIGH (ref 70–99)
Glucose, Bld: 144 mg/dL — ABNORMAL HIGH (ref 70–99)
Glucose, Bld: 185 mg/dL — ABNORMAL HIGH (ref 70–99)
Glucose, Bld: 133 mg/dL — ABNORMAL HIGH (ref 70–99)
Potassium: 3.4 mEq/L — ABNORMAL LOW (ref 3.5–5.1)
Potassium: 3.7 mEq/L (ref 3.5–5.1)
Potassium: 4.3 mEq/L (ref 3.5–5.1)
Potassium: 4.5 mEq/L (ref 3.5–5.1)
Potassium: 5.1 mEq/L (ref 3.5–5.1)
Potassium: 4.7 mEq/L (ref 3.5–5.1)
Sodium: 133 mEq/L — ABNORMAL LOW (ref 135–145)
Sodium: 132 mEq/L — ABNORMAL LOW (ref 135–145)
Sodium: 133 mEq/L — ABNORMAL LOW (ref 135–145)
Sodium: 131 mEq/L — ABNORMAL LOW (ref 135–145)
Sodium: 130 mEq/L — ABNORMAL LOW (ref 135–145)
Sodium: 130 mEq/L — ABNORMAL LOW (ref 135–145)

## 2012-11-01 LAB — CBC
HCT: 31.3 % — ABNORMAL LOW (ref 39.0–52.0)
Hemoglobin: 11.2 g/dL — ABNORMAL LOW (ref 13.0–17.0)
MCH: 31.3 pg (ref 26.0–34.0)
MCHC: 35.8 g/dL (ref 30.0–36.0)
MCV: 87.4 fL (ref 78.0–100.0)
Platelets: 124 10*3/uL — ABNORMAL LOW (ref 150–400)
RBC: 3.58 MIL/uL — ABNORMAL LOW (ref 4.22–5.81)
RDW: 12.6 % (ref 11.5–15.5)
WBC: 7.2 10*3/uL (ref 4.0–10.5)

## 2012-11-01 LAB — GLUCOSE, CAPILLARY
Glucose-Capillary: 101 mg/dL — ABNORMAL HIGH (ref 70–99)
Glucose-Capillary: 102 mg/dL — ABNORMAL HIGH (ref 70–99)
Glucose-Capillary: 109 mg/dL — ABNORMAL HIGH (ref 70–99)
Glucose-Capillary: 109 mg/dL — ABNORMAL HIGH (ref 70–99)
Glucose-Capillary: 114 mg/dL — ABNORMAL HIGH (ref 70–99)
Glucose-Capillary: 115 mg/dL — ABNORMAL HIGH (ref 70–99)
Glucose-Capillary: 116 mg/dL — ABNORMAL HIGH (ref 70–99)
Glucose-Capillary: 130 mg/dL — ABNORMAL HIGH (ref 70–99)
Glucose-Capillary: 147 mg/dL — ABNORMAL HIGH (ref 70–99)
Glucose-Capillary: 98 mg/dL (ref 70–99)

## 2012-11-01 LAB — HEPATIC FUNCTION PANEL
ALT: 192 U/L — ABNORMAL HIGH (ref 0–53)
AST: 209 U/L — ABNORMAL HIGH (ref 0–37)
Albumin: 2.4 g/dL — ABNORMAL LOW (ref 3.5–5.2)
Alkaline Phosphatase: 61 U/L (ref 39–117)
Bilirubin, Direct: 0.3 mg/dL (ref 0.0–0.3)
Indirect Bilirubin: 0.4 mg/dL (ref 0.3–0.9)
Total Bilirubin: 0.7 mg/dL (ref 0.3–1.2)
Total Protein: 5 g/dL — ABNORMAL LOW (ref 6.0–8.3)

## 2012-11-01 LAB — TROPONIN I
Troponin I: 0.49 ng/mL (ref <0.30)
Troponin I: <0.3 ng/mL (ref <0.30)
Troponin I: <0.3 ng/mL (ref <0.30)

## 2012-11-01 LAB — LACTIC ACID, PLASMA: Lactic Acid, Venous: 1.7 mmol/L (ref 0.5–2.2)

## 2012-11-01 LAB — HEPARIN LEVEL (UNFRACTIONATED)
Heparin Unfractionated: 0.93 IU/mL — ABNORMAL HIGH (ref 0.30–0.70)
Heparin Unfractionated: 1.33 IU/mL — ABNORMAL HIGH (ref 0.30–0.70)
Heparin Unfractionated: 0.74 IU/mL — ABNORMAL HIGH (ref 0.30–0.70)

## 2012-11-01 LAB — TSH: TSH: 1.36 u[IU]/mL (ref 0.350–4.500)

## 2012-11-01 LAB — HEMOGLOBIN A1C
Hgb A1c MFr Bld: 6.3 % — ABNORMAL HIGH (ref <5.7)
Mean Plasma Glucose: 134 mg/dL — ABNORMAL HIGH (ref <117)

## 2012-11-01 LAB — URINE MICROSCOPIC-ADD ON

## 2012-11-01 LAB — MAGNESIUM: Magnesium: 1.6 mg/dL (ref 1.5–2.5)

## 2012-11-01 LAB — PROTIME-INR
INR: 1.29 (ref 0.00–1.49)
Prothrombin Time: 15.8 seconds — ABNORMAL HIGH (ref 11.6–15.2)

## 2012-11-01 LAB — APTT: aPTT: >200 seconds (ref 24–37)

## 2012-11-01 LAB — PRO B NATRIURETIC PEPTIDE: Pro B Natriuretic peptide (BNP): 1702 pg/mL — ABNORMAL HIGH (ref 0–450)

## 2012-11-01 MED ORDER — CIPROFLOXACIN IN D5W 400 MG/200ML IV SOLN
400.0000 mg | Freq: Two times a day (BID) | INTRAVENOUS | Status: DC
  Administered 2012-11-01 – 2012-11-04 (×6): 400 mg via INTRAVENOUS
  Filled 2012-11-01 (×7): qty 200

## 2012-11-01 MED ORDER — HEPARIN (PORCINE) IN NACL 100-0.45 UNIT/ML-% IJ SOLN
1900.0000 [IU]/h | INTRAMUSCULAR | Status: DC
  Administered 2012-11-01: 900 [IU]/h via INTRAVENOUS
  Administered 2012-11-02: 1150 [IU]/h via INTRAVENOUS
  Administered 2012-11-03: 1850 [IU]/h via INTRAVENOUS
  Administered 2012-11-03: 1500 [IU]/h via INTRAVENOUS
  Filled 2012-11-01 (×8): qty 250

## 2012-11-01 MED ORDER — SODIUM POLYSTYRENE SULFONATE 15 GM/60ML PO SUSP
15.0000 g | Freq: Once | ORAL | Status: AC
  Administered 2012-11-01: 15 g
  Filled 2012-11-01: qty 60

## 2012-11-01 MED ORDER — POTASSIUM CHLORIDE 10 MEQ/50ML IV SOLN
10.0000 meq | INTRAVENOUS | Status: AC
  Administered 2012-11-01 (×4): 10 meq via INTRAVENOUS
  Filled 2012-11-01 (×3): qty 50

## 2012-11-01 MED ORDER — MAGNESIUM SULFATE 40 MG/ML IJ SOLN
2.0000 g | Freq: Once | INTRAMUSCULAR | Status: AC
  Administered 2012-11-01: 2 g via INTRAVENOUS
  Filled 2012-11-01: qty 50

## 2012-11-01 MED ORDER — POTASSIUM CHLORIDE 10 MEQ/50ML IV SOLN
INTRAVENOUS | Status: AC
  Administered 2012-11-01: 10 meq via INTRAVENOUS
  Filled 2012-11-01: qty 50

## 2012-11-01 MED ORDER — NOREPINEPHRINE BITARTRATE 1 MG/ML IJ SOLN
2.0000 ug/min | INTRAVENOUS | Status: DC
  Administered 2012-11-01: 23 ug/min via INTRAVENOUS
  Administered 2012-11-01: 30 ug/min via INTRAVENOUS
  Administered 2012-11-02: 25 ug/min via INTRAVENOUS
  Administered 2012-11-02: 12 ug/min via INTRAVENOUS
  Administered 2012-11-02: 13 ug/min via INTRAVENOUS
  Filled 2012-11-01 (×5): qty 8

## 2012-11-01 MED ORDER — SODIUM CHLORIDE 0.9 % IV BOLUS (SEPSIS)
500.0000 mL | Freq: Once | INTRAVENOUS | Status: AC
  Administered 2012-11-01: 500 mL via INTRAVENOUS

## 2012-11-01 NOTE — Progress Notes (Signed)
eLink Physician-Brief Progress Note Patient Name: [name] Christensen DOB: 21-Aug-1932 MRN: [id number]  Date of Service  11/24/2012   HPI/Events of Note   Hypokalemia  eICU Interventions  Potassium replaced   Intervention Category Minor Interventions: Electrolytes abnormality - evaluation and management  [name] 10/25/2012, 1:48 AM

## 2012-11-01 NOTE — Progress Notes (Addendum)
1610: MD notified for increased bleeding from central line site.  Pt currently on heparin, heparin level drawn at 0400 waiting on results.  Pressure held for 15 minutes, dressing changed.  0445 notified of heparin level 0.9, decreased rate per pharmacy.  Will cont to monitor pt closley

## 2012-11-01 NOTE — Progress Notes (Signed)
eLink Physician-Brief Progress Note Patient Name: INGVALD THEISEN DOB: 01/31/1932 MRN: [id number]  Date of Service  11/16/2012   HPI/Events of Note   ABG reviewed   eICU Interventions  Vent changed; ABG  in 1 h      Shakeya Kerkman 11/22/2012, 4:49 PM

## 2012-11-01 NOTE — Progress Notes (Signed)
Patient ID: [name] Christensen, male   DOB: Mar 26, 1932, 78 y.o.   MRN: [id number]    SUBJECTIVE:Patient remains intubated and sedated on hypothermia protocol.  No major events overnight, remains on a low dose of norepinephrine.      . [COMPLETED] sodium chloride  2,000 mL Intravenous Once  . antiseptic oral rinse  15 mL Mouth Rinse QID  . artificial tears  1 application Both Eyes Q8H  . aspirin  300 mg Rectal NOW  . chlorhexidine  15 mL Mouth Rinse BID  . Chlorhexidine Gluconate Cloth  6 each Topical Q0600  . [COMPLETED] cisatracurium  0.1 mg/kg Intravenous Once  . fentaNYL  100 mcg Intravenous Once  . insulin aspart  0-9 Units Subcutaneous Q4H  . metoprolol  5 mg Intravenous Q4H  . midazolam  2 mg Intravenous Once  . mupirocin ointment  1 application Nasal BID  . pantoprazole (PROTONIX) IV  40 mg Intravenous Q24H  . [COMPLETED] potassium chloride  10 mEq Intravenous Q1 Hr x 2  . [COMPLETED] potassium chloride  10 mEq Intravenous Q1 Hr x 4  norepinephrine @ 6 Fentanyl gtt Versed gtt Cisatracurium gtt    Filed Vitals:   11/18/2012 0600 11/24/2012 0630 11/17/2012 0700 11/15/2012 0741  BP:      Pulse: 50 50 50 48  Temp: 91.6 F (33.1 C)  91.4 F (33 C)   TempSrc: Core (Comment)  Core (Comment)   Resp: 20 20 20 18   Height:      Weight:      SpO2: 97% 96% 96% 94%    Intake/Output Summary (Last 24 hours) at 11/20/2012 0756 Last data filed at 10/29/2012 0700  Gross per 24 hour  Intake 1363.23 ml  Output   1070 ml  Net 293.23 ml    LABS: Basic Metabolic Panel:  Basename 10/29/2012 0400 10/25/2012 0011  NA 132* 133*  K 3.7 3.4*  CL 106 105  CO2 17* 18*  GLUCOSE 119* 120*  BUN 13 13  CREATININE 0.70 0.76  CALCIUM 7.9* 8.0*  MG -- --  PHOS -- --   Liver Function Tests:  [facility] 11/22/2012 0411 11/24/2012 1805  AST 209* 267*  ALT 192* 239*  ALKPHOS 61 78  BILITOT 0.7 0.4  PROT 5.0* 5.4*  ALBUMIN 2.4* 2.7*   No results found for this basename: LIPASE:2,AMYLASE:2 in the last 72  hours CBC:  Basename 11/21/2012 0400  WBC 7.2  NEUTROABS --  HGB 11.2*  HCT 31.3*  MCV 87.4  PLT 124*   Cardiac Enzymes:  Basename 10/26/2012 0410 11/17/2012 2200 10/30/2012 1805  CKTOTAL -- -- --  CKMB -- -- --  CKMBINDEX -- -- --  TROPONINI 0.49* 0.69* 0.48*   BNP: No components found with this basename: POCBNP:3 D-Dimer: No results found for this basename: DDIMER:2 in the last 72 hours Hemoglobin A1C: No results found for this basename: HGBA1C in the last 72 hours Fasting Lipid Panel: No results found for this basename: CHOL,HDL,LDLCALC,TRIG,CHOLHDL,LDLDIRECT in the last 72 hours Thyroid Function Tests:  Basename 11/19/2012 1805  TSH 1.360  T4TOTAL --  T3FREE --  THYROIDAB --   Anemia Panel: No results found for this basename: VITAMINB12,FOLATE,FERRITIN,TIBC,IRON,RETICCTPCT in the last 72 hours  RADIOLOGY: Dg Chest Port 1 View  11/13/2012  *RADIOLOGY REPORT*  Clinical Data: Intubated and central line placement.  PORTABLE CHEST - 1 VIEW  Comparison: Yesterday.  Findings: Interval endotracheal tube with its tip 1.1 cm above the carina.  Interval right jugular catheter with its tip  in the superior vena cava.  No pneumothorax.  The heart remains normal in size.  Interval diffuse increase in prominence of the pulmonary vasculature and interstitial markings with extensive bilateral airspace opacity.  No definite pleural fluid visualized.  Stable post CABG changes.  There is a possible nasogastric tube in place without visualization of this tube beyond the distal esophagus.  IMPRESSION:  1.  Endotracheal tube tip 1.1 cm above the carina.  It is recommended that this be retracted 5 cm. 2.  Interval extensive bilateral pulmonary edema. 3.  Possible nasogastric tube with its tip in the distal esophagus, not well visualized.   Original Report Authenticated By: Beckie Salts, M.D.    Ct Portable Head W/o Cm  11/15/2012  *RADIOLOGY REPORT*  Clinical Data: Clearing for intracranial hemorrhage  following initiation of heparin treatment.  CT HEAD WITHOUT CONTRAST  Technique:  Contiguous axial images were obtained from the base of the skull through the vertex without contrast.  Comparison: None.  Findings: Mildly enlarged ventricles and subarachnoid spaces.  Mild patchy white matter low density in both cerebral hemispheres.  No intracranial hemorrhage, mass lesion or CT evidence of acute infarction.  Marked bilateral ethmoid and sphenoid sinus mucosal thickening with moderate bilateral inferior frontal sinus mucosal thickening.  IMPRESSION:  1.  No intracranial hemorrhage. 2.  Mild atrophy. 3.  Mild chronic small vessel white matter ischemic changes in both cerebral hemispheres. 4.  Chronic bilateral ethmoid, frontal and sphenoid sinusitis.   Original Report Authenticated By: Beckie Salts, M.D.     PHYSICAL EXAM General: Intubated, sedated Lungs: Clear anteriorly CV: Nondisplaced PMI.  Heart regular S1/S2, no S3/S4, no murmur (difficult exam as hypothermia apparatus in place on chest).  No peripheral edema.  Abdomen: Soft, no distention.  Extremities: No clubbing or cyanosis.   TELEMETRY: Reviewed telemetry pt in NSR  ASSESSMENT AND PLAN: 78 yo with history of CAD s/p CABG had out of hospital arrest, suspected VT/vfib given AED defibrillation.  1. Cardiac arrest: Suspect VT or vfib.  No acute ECG changes (ECG RBBB, same as prior to arrest).  Troponin peaked at 0.69.  BNP elevated, pulmonary edema on CXR => ? Underlying ischemic cardiomyopathy given known CABG with scar-related VT.   - Echo - Will need cath if he has neurologic recovery.  2. CAD: Prior CABG.  Had been stable symptomatically.  Has not had recent echo. Given no ECG changes and only small rise in troponin despite arrest/hypotension/CPR etc, this may not be a primarily ACS-related event.  It is possible that he had a pre-existing ischemic cardiomyopathy with scar-related VT.  He will need cath if he has neurologic recovery. Stop  metoprolol while on norepinephrine.  Continue ASA.  Can continue heparin gtt today if no bleeding problems but would stop after today.   Rob Nidiffer 11/18/2012 8:04 AM

## 2012-11-01 NOTE — Progress Notes (Signed)
Changed Fio2 and rr per Dr. Liliane Channel orders

## 2012-11-01 NOTE — Progress Notes (Signed)
ANTICOAGULATION CONSULT NOTE - Follow Up Consult  Pharmacy Consult for heparin Indication: ACS, s/p cardiac arrest  Allergies  Allergen Reactions  . Amoxicillin     REACTION: unspecified  . Ezetimibe     REACTION: numbness  . Niacin     REACTION: CP  . Penicillins   . Pravastatin Sodium   . Rosuvastatin     REACTION: aches    Patient Measurements: Height: 5\' 11"  (180.3 cm) Weight: 167 lb 8.8 oz (76 kg) IBW/kg (Calculated) : 75.3  Heparin Dosing Weight: 77kg  Vital Signs: Temp: 91.4 F (33 C) (12/08 1100) Temp src: Core (Comment) (12/08 0800) BP: 136/62 mmHg (12/07 2353) Pulse Rate: 52  (12/08 1100)  Labs:  Basename 11/09/2012 1100 11/24/2012 0800 10/29/2012 0410 11/04/2012 0400 10/26/2012 0011 11/10/2012 2200 11/22/2012 2000 11/10/2012 1805  HGB -- -- -- 11.2* -- -- -- --  HCT -- -- -- 31.3* -- -- -- --  PLT -- -- -- 124* -- -- -- --  APTT -- -- -- -- >200* -- -- 58*  LABPROT -- -- -- -- 15.8* -- -- 15.3*  INR -- -- -- -- 1.29 -- -- 1.23  HEPARINUNFRC 1.33* -- -- 0.93* -- -- 0.19* --  CREATININE -- 0.79 -- 0.70 0.76 -- -- --  CKTOTAL -- -- -- -- -- -- -- --  CKMB -- -- -- -- -- -- -- --  TROPONINI -- -- 0.49* -- -- 0.69* -- 0.48*    Estimated Creatinine Clearance: 81.1 ml/min (by C-G formula based on Cr of 0.79).  Assessment: Patient is a 78 y.o M s/p cardiac arrest currently on hypothermia protocol.  Heparin level continues to be supratherapeutic at 1.33 (drawn at correct site) despite rate decreased to 1200 units/hr this morning.  Some bleeding noted at central line access site and hematuria yesterday but now resolved per RN.  Goal of Therapy:  Heparin level 0.3-0.7 units/ml Monitor platelets by anticoagulation protocol: Yes   Plan:  1) hold heparin drip for 1 hour, then decrease drip to 950 units/hr 2) check 6 hr level  Lupe Handley P 11/09/2012,11:38 AM

## 2012-11-01 NOTE — Progress Notes (Signed)
ANTICOAGULATION CONSULT NOTE - Follow Up Consult  Pharmacy Consult for heparin Indication: s/p cardiac arrest  Labs:  Basename 11/14/2012 0400 11/22/2012 0011 11/06/2012 2225 11/19/2012 2200 11/10/2012 2000 11/08/2012 1805  HGB 11.2* -- -- -- -- --  HCT 31.3* -- -- -- -- --  PLT 124* -- -- -- -- --  APTT -- >200* -- -- -- 58*  LABPROT -- 15.8* -- -- -- 15.3*  INR -- 1.29 -- -- -- 1.23  HEPARINUNFRC 0.93* -- -- -- 0.19* --  CREATININE -- 0.76 0.74 -- 0.76 --  CKTOTAL -- -- -- -- -- --  CKMB -- -- -- -- -- --  TROPONINI -- -- -- 0.69* -- 0.48*    Assessment: 78yo male now supratherapeutic on heparin after rate increase, hypothermia protocol may cause increased levels.  Goal of Therapy:  Heparin level 0.3-0.7 units/ml   Plan:  Will decrease heparin gtt to 1200 units/hr and check level in 6hr.  Colleen Can PharmD BCPS 11/13/2012,4:49 AM

## 2012-11-01 NOTE — Progress Notes (Signed)
Pulled tube back 2CM.  And Made changed to vent per Dr. Gwendolyn Grant orders

## 2012-11-01 NOTE — Progress Notes (Signed)
Echocardiogram 2D Echocardiogram has been performed.  Rachele Lamaster 11/20/2012, 11:22 AM

## 2012-11-01 NOTE — H&P (Signed)
PULMONARY  / CRITICAL CARE MEDICINE  Name: [name] Christensen MRN: [id number] DOB: [birth year]    LOS: 1  CHIEF COMPLAINT:  Cardiac Arrest  BRIEF PATIENT DESCRIPTION: 78 yo man, hx CAD/CABG, HTN. Witnessed out-of-hospital arrest, 15 min downtime. ECG with RBBB. Transferred from [facility] 12/7, hypothermia initiated.   LINES / TUBES: ETT 12/7 >>> CVl rt ij 12/7>>> A line rt rad 12/7>>>  CULTURES:  ANTIBIOTICS:  SIGNIFICANT EVENTS:  12/7 Out of hospital arrest, estimated 15 minutes down time 12/8- slight brady with cooling, low dose levo needs  LEVEL OF CARE:  ICU PRIMARY SERVICE:  PCCM CONSULTANTS:  Cardiology CODE STATUS: Full DIET:  NPO DVT Px:  Heparin gtt GI Px:  protonix   INTERVAL HISTORY:  Hypothermia tolerated  VITAL SIGNS: Temp:  [90.5 F (32.5 C)-97.5 F (36.4 C)] 91.4 F (33 C) (12/08 0800) Pulse Rate:  [48-73] 49  (12/08 0800) Resp:  [15-20] 20  (12/08 0800) BP: (135-192)/(53-85) 136/62 mmHg (12/07 2353) SpO2:  [93 %-100 %] 93 % (12/08 0800) Arterial Line BP: (119-175)/(53-69) 124/55 mmHg (12/08 0800) FiO2 (%):  [60 %-100 %] 60 % (12/08 0741) Weight:  [76 kg (167 lb 8.8 oz)-77.1 kg (169 lb 15.6 oz)] 76 kg (167 lb 8.8 oz) (12/07 2000) HEMODYNAMICS: CVP:  [5 mmHg-7 mmHg] 7 mmHg VENTILATOR SETTINGS: Vent Mode:  [-] PRVC FiO2 (%):  [60 %-100 %] 60 % Set Rate:  [20 bmp] 20 bmp Vt Set:  [500 mL] 500 mL PEEP:  [5 cmH20] 5 cmH20 Plateau Pressure:  [9 cmH20-18 cmH20] 18 cmH20 INTAKE / OUTPUT: Intake/Output      12/07 0701 - 12/08 0700 12/08 0701 - 12/09 0700   I.V. (mL/kg) 1063.2 (14) 103.1 (1.4)   IV Piggyback 300    Total Intake(mL/kg) 1363.2 (17.9) 103.1 (1.4)   Urine (mL/kg/hr) 1070 (0.6)    Total Output 1070    Net +293.2 +103.1          PHYSICAL EXAMINATION: General:  Ill appearing man, intubated and sedated Neuro:  Sedated and paralyzed HEENT:  ETT in place Cardiovascular: S1 S2 rrr Lungs:  CTA Abdomen:  Soft, Non-distended, BS wnl   Musculoskeletal:  No  Skin:  Cool, no rash   LABS: Cbc  Lab Nov 28, 2012 0400  WBC 7.2  HGB 11.2*  HCT 31.3*  PLT 124*    Chemistry   Lab 11/28/2012 0400 11-28-2012 0011 10/26/2012 2225  NA 132* 133* 134*  K 3.7 3.4* 3.0*  CL 106 105 106  CO2 17* 18* 19  BUN 13 13 13   CREATININE 0.70 0.76 0.74  CALCIUM 7.9* 8.0* 7.9*  MG -- -- --  PHOS -- -- --  GLUCOSE 119* 120* 119*    Liver fxn  Lab Nov 28, 2012 0411 11/15/2012 1805  AST 209* 267*  ALT 192* 239*  ALKPHOS 61 78  BILITOT 0.7 0.4  PROT 5.0* 5.4*  ALBUMIN 2.4* 2.7*   coags  Lab Nov 28, 2012 0011 11/16/2012 1805  APTT >200* 58*  INR 1.29 1.23   Sepsis markers  Lab 10/27/2012 1805  LATICACIDVEN 1.1  PROCALCITON --   Cardiac markers  Lab Nov 28, 2012 0410 11/24/2012 2200 11/19/2012 1805  CKTOTAL -- -- --  CKMB -- -- --  TROPONINI 0.49* 0.69* 0.48*   BNP  Lab 11-28-2012 0410  PROBNP 1702.0*   ABG  Lab 11/22/2012 1839 10/27/2012 1616  PHART 7.325* 7.224*  PCO2ART 33.7* 52.0*  PO2ART 62.0* 74.0*  HCO3 18.4* 21.6  TCO2 20 23    CBG trend  Lab 10/26/2012 0807 10/29/2012 0545 10/25/2012 0358 10/27/2012 0208 11/22/2012 0015  GLUCAP 109* 102* 114* 101* 116*    IMAGING: CXR 12/8 >> B edema pattern, ETT at left main, will correct  ECG:  DIAGNOSES: Active Problems:  HYPERLIPIDEMIA  HYPERTENSION  CORONARY ARTERY DISEASE  CORONARY ARTERY BYPASS GRAFT, HX OF  Sudden cardiac death  Acute respiratory failure   ASSESSMENT / PLAN:  PULMONARY  ASSESSMENT: VDRF due to cardiac arrest, hypothermia PLAN:   - PRVC, abg last reviewed -repeat abg for need MV adjustment -pcxr c/w edema, but O2 settings NOT reflective, no lasix as of now -increase peep 8, then re reduce to goal 50% -if edema, will be peep responsive  CARDIOVASCULAR  ASSESSMENT: Cardiac arrest, suspect primary ventricular arrhythmia (since AED shocked him) although this would be less likely in absence of a cardiomyopathy. ? Possible viral syndrome, ? Viral myocarditis  in a pt with severe underlying coronary disease. ECG w RBBB (old). No STEMI or indication for emergent cath.   PLAN:  - heparin gtt for presumed ACS - hypothermia protocol continue - ASA - metoprolol dc with brady -overall asympto brady, if levophed needs rise , would increase goal met to 35  ( see NEJM 33 vs 36)  RENAL  ASSESSMENT:  S Cr 1.28 (? Baseline)  PLAN:   - follow BMP q4h, cold diuresis -hold lasix  GASTROINTESTINAL  ASSESSMENT:  Transaminitis, ? EtOH use PLAN:   - follow LFT, check hepatitis panel >>> - PPI for GI prophylaxis -no TF till rewarm  HEMATOLOGIC  ASSESSMENT:   PLAN:  - follow CBC Hep drip acs  INFECTIOUS  ASSESSMENT:  No clear source infxn PLAN:   -low threhsold add abx with hypothermia - no empiric abx but low threshold to start if clinical change  ENDOCRINE  ASSESSMENT:  hyperglycemia , controlled PLAN:   - ICU HG protocol  NEUROLOGIC  ASSESSMENT:  Encephalopathy PLAN:   - Sedated and paralyzed - reassess MS once re-warmed  I have personally obtained a history, examined the patient, evaluated laboratory and imaging results, formulated the assessment and plan and placed orders. CRITICAL CARE: The patient is critically ill with multiple organ systems failure and requires high complexity decision making for assessment and support, frequent evaluation and titration of therapies, application of advanced monitoring technologies and extensive interpretation of multiple databases. Critical Care Time devoted to patient care services described in this note is 30 minutes.   Mcarthur Rossetti. Tyson Alias, MD, FACP Pgr: [phone] [facility]

## 2012-11-01 NOTE — Progress Notes (Signed)
ANTICOAGULATION CONSULT NOTE - Follow Up Consult  Pharmacy Consult for heparin Indication: ACS, s/p cardiac arrest  Allergies  Allergen Reactions  . Amoxicillin     REACTION: unspecified  . Ezetimibe     REACTION: numbness  . Niacin     REACTION: CP  . Penicillins   . Pravastatin Sodium   . Rosuvastatin     REACTION: aches    Patient Measurements: Height: 5\' 11"  (180.3 cm) Weight: 167 lb 8.8 oz (76 kg) IBW/kg (Calculated) : 75.3  Heparin Dosing Weight: 77kg  Vital Signs: Temp: 91.6 F (33.1 C) (12/08 1800) Pulse Rate: 50  (12/08 1800)  Labs:  Basename 11/22/2012 1823 11/10/2012 1600 10/26/2012 1216 11/23/2012 1100 10/26/2012 0800 10/26/2012 0410 10/30/2012 0400 11/10/2012 0011 11/17/2012 2200 10/25/2012 1805  HGB -- -- -- -- -- -- 11.2* -- -- --  HCT -- -- -- -- -- -- 31.3* -- -- --  PLT -- -- -- -- -- -- 124* -- -- --  APTT -- -- -- -- -- -- -- >200* -- 58*  LABPROT -- -- -- -- -- -- -- 15.8* -- 15.3*  INR -- -- -- -- -- -- -- 1.29 -- 1.23  HEPARINUNFRC 0.74* -- -- 1.33* -- -- 0.93* -- -- --  CREATININE -- 0.79 0.71 -- 0.79 -- -- -- -- --  CKTOTAL -- -- -- -- -- -- -- -- -- --  CKMB -- -- -- -- -- -- -- -- -- --  TROPONINI -- <0.30 -- -- -- 0.49* -- -- 0.69* --    Estimated Creatinine Clearance: 81.1 ml/min (by C-G formula based on Cr of 0.79).  Assessment: Patient is a 78 y.o M s/p cardiac arrest currently on hypothermia protocol.  Heparin level continues to be supratherapeutic at 0.74 (drawn at correct site) at 950 units/hr.  He had some bleeding noted at central line access site  and hematuria 12/7 but now both are resolved.  He started rewarming about 2.5hr ago which means he will start clearing heparin faster.  I will drop his heparin drip rate slightly.  Goal of Therapy:  Heparin level 0.3-0.7 units/ml Monitor platelets by anticoagulation protocol: Yes   Plan:  Decrease heparin drip 900 uts/hr  Daily HL, CBC  Marcelino Scot 11/15/2012,8:07 PM

## 2012-11-02 ENCOUNTER — Inpatient Hospital Stay (HOSPITAL_COMMUNITY): Payer: Medicare Other

## 2012-11-02 DIAGNOSIS — I2510 Atherosclerotic heart disease of native coronary artery without angina pectoris: Secondary | ICD-10-CM

## 2012-11-02 LAB — BASIC METABOLIC PANEL
BUN: 10 mg/dL (ref 6–23)
BUN: 11 mg/dL (ref 6–23)
CO2: 20 mEq/L (ref 19–32)
CO2: 20 mEq/L (ref 19–32)
Calcium: 7.6 mg/dL — ABNORMAL LOW (ref 8.4–10.5)
Calcium: 7.8 mg/dL — ABNORMAL LOW (ref 8.4–10.5)
Chloride: 100 mEq/L (ref 96–112)
Chloride: 102 mEq/L (ref 96–112)
Creatinine, Ser: 0.79 mg/dL (ref 0.50–1.35)
Creatinine, Ser: 0.94 mg/dL (ref 0.50–1.35)
GFR calc Af Amer: >90 mL/min (ref >90)
GFR calc Af Amer: >90 mL/min (ref >90)
GFR calc non Af Amer: 78 mL/min — ABNORMAL LOW (ref >90)
GFR calc non Af Amer: 84 mL/min — ABNORMAL LOW (ref >90)
Glucose, Bld: 104 mg/dL — ABNORMAL HIGH (ref 70–99)
Glucose, Bld: 134 mg/dL — ABNORMAL HIGH (ref 70–99)
Potassium: 3.6 mEq/L (ref 3.5–5.1)
Potassium: 4.1 mEq/L (ref 3.5–5.1)
Sodium: 128 mEq/L — ABNORMAL LOW (ref 135–145)
Sodium: 129 mEq/L — ABNORMAL LOW (ref 135–145)

## 2012-11-02 LAB — CBC WITH DIFFERENTIAL/PLATELET
Basophils Absolute: 0 10*3/uL (ref 0.0–0.1)
Basophils Relative: 0 % (ref 0–1)
Eosinophils Absolute: 0.1 10*3/uL (ref 0.0–0.7)
Eosinophils Relative: 1 % (ref 0–5)
HCT: 31.2 % — ABNORMAL LOW (ref 39.0–52.0)
Hemoglobin: 11 g/dL — ABNORMAL LOW (ref 13.0–17.0)
Lymphocytes Relative: 8 % — ABNORMAL LOW (ref 12–46)
Lymphs Abs: 0.9 10*3/uL (ref 0.7–4.0)
MCH: 30.5 pg (ref 26.0–34.0)
MCHC: 35.3 g/dL (ref 30.0–36.0)
MCV: 86.4 fL (ref 78.0–100.0)
Monocytes Absolute: 0.2 10*3/uL (ref 0.1–1.0)
Monocytes Relative: 2 % — ABNORMAL LOW (ref 3–12)
Neutro Abs: 10.8 10*3/uL — ABNORMAL HIGH (ref 1.7–7.7)
Neutrophils Relative %: 90 % — ABNORMAL HIGH (ref 43–77)
Platelets: 152 10*3/uL (ref 150–400)
RBC: 3.61 MIL/uL — ABNORMAL LOW (ref 4.22–5.81)
RDW: 12.6 % (ref 11.5–15.5)
WBC: 12.1 10*3/uL — ABNORMAL HIGH (ref 4.0–10.5)

## 2012-11-02 LAB — COMPREHENSIVE METABOLIC PANEL
ALT: 139 U/L — ABNORMAL HIGH (ref 0–53)
AST: 89 U/L — ABNORMAL HIGH (ref 0–37)
Albumin: 2.3 g/dL — ABNORMAL LOW (ref 3.5–5.2)
Alkaline Phosphatase: 61 U/L (ref 39–117)
BUN: 11 mg/dL (ref 6–23)
CO2: 20 mEq/L (ref 19–32)
Calcium: 7.9 mg/dL — ABNORMAL LOW (ref 8.4–10.5)
Chloride: 102 mEq/L (ref 96–112)
Creatinine, Ser: 0.86 mg/dL (ref 0.50–1.35)
GFR calc Af Amer: >90 mL/min (ref >90)
GFR calc non Af Amer: 81 mL/min — ABNORMAL LOW (ref >90)
Glucose, Bld: 109 mg/dL — ABNORMAL HIGH (ref 70–99)
Potassium: 3.6 mEq/L (ref 3.5–5.1)
Sodium: 129 mEq/L — ABNORMAL LOW (ref 135–145)
Total Bilirubin: 0.5 mg/dL (ref 0.3–1.2)
Total Protein: 5.2 g/dL — ABNORMAL LOW (ref 6.0–8.3)

## 2012-11-02 LAB — HEPATITIS PANEL, ACUTE
HCV Ab: REACTIVE — AB
HCV Ab: REACTIVE — AB
Hep A IgM: NEGATIVE
Hep A IgM: NEGATIVE
Hep B C IgM: NEGATIVE
Hep B C IgM: NEGATIVE
Hepatitis B Surface Ag: NEGATIVE
Hepatitis B Surface Ag: NEGATIVE

## 2012-11-02 LAB — GLUCOSE, CAPILLARY
Glucose-Capillary: 109 mg/dL — ABNORMAL HIGH (ref 70–99)
Glucose-Capillary: 123 mg/dL — ABNORMAL HIGH (ref 70–99)
Glucose-Capillary: 125 mg/dL — ABNORMAL HIGH (ref 70–99)
Glucose-Capillary: 90 mg/dL (ref 70–99)
Glucose-Capillary: 92 mg/dL (ref 70–99)
Glucose-Capillary: 99 mg/dL (ref 70–99)
Glucose-Capillary: 99 mg/dL (ref 70–99)

## 2012-11-02 LAB — HEPARIN LEVEL (UNFRACTIONATED)
Heparin Unfractionated: 0.43 IU/mL (ref 0.30–0.70)
Heparin Unfractionated: 0.12 IU/mL — ABNORMAL LOW (ref 0.30–0.70)

## 2012-11-02 LAB — MAGNESIUM: Magnesium: 1.5 mg/dL (ref 1.5–2.5)

## 2012-11-02 LAB — TROPONIN I: Troponin I: <0.3 ng/mL (ref <0.30)

## 2012-11-02 LAB — CALCIUM, IONIZED: Calcium, Ion: 1.22 mmol/L (ref 1.12–1.32)

## 2012-11-02 MED ORDER — OSMOLITE 1.5 CAL PO LIQD
1000.0000 mL | ORAL | Status: DC
  Administered 2012-11-02 – 2012-11-05 (×4): 1000 mL
  Filled 2012-11-02 (×6): qty 1000

## 2012-11-02 MED ORDER — MIDAZOLAM HCL 2 MG/2ML IJ SOLN
1.0000 mg | INTRAMUSCULAR | Status: DC | PRN
  Administered 2012-11-02 – 2012-11-05 (×17): 2 mg via INTRAVENOUS
  Filled 2012-11-02 (×17): qty 2
  Filled 2012-11-02: qty 4

## 2012-11-02 MED ORDER — PRO-STAT SUGAR FREE PO LIQD
30.0000 mL | Freq: Four times a day (QID) | ORAL | Status: DC
  Administered 2012-11-02 – 2012-11-05 (×14): 30 mL
  Filled 2012-11-02 (×19): qty 30

## 2012-11-02 MED ORDER — HEPARIN BOLUS VIA INFUSION
2000.0000 [IU] | Freq: Once | INTRAVENOUS | Status: AC
  Administered 2012-11-02: 2000 [IU] via INTRAVENOUS
  Filled 2012-11-02: qty 2000

## 2012-11-02 MED ORDER — PANTOPRAZOLE SODIUM 40 MG PO PACK
40.0000 mg | PACK | ORAL | Status: DC
  Administered 2012-11-02 – 2012-11-05 (×4): 40 mg
  Filled 2012-11-02 (×5): qty 20

## 2012-11-02 MED ORDER — POTASSIUM CHLORIDE 20 MEQ/15ML (10%) PO LIQD
40.0000 meq | Freq: Three times a day (TID) | ORAL | Status: AC
  Administered 2012-11-02 (×2): 40 meq
  Filled 2012-11-02 (×2): qty 30

## 2012-11-02 MED ORDER — FUROSEMIDE 10 MG/ML IJ SOLN
40.0000 mg | Freq: Three times a day (TID) | INTRAMUSCULAR | Status: AC
  Administered 2012-11-02 (×2): 40 mg via INTRAVENOUS
  Filled 2012-11-02 (×2): qty 4

## 2012-11-02 MED ORDER — ACETAMINOPHEN 500 MG PO TABS
500.0000 mg | ORAL_TABLET | Freq: Four times a day (QID) | ORAL | Status: DC | PRN
  Administered 2012-11-02 – 2012-11-10 (×2): 500 mg via ORAL
  Filled 2012-11-02: qty 1

## 2012-11-02 MED ORDER — FENTANYL CITRATE 0.05 MG/ML IJ SOLN
25.0000 ug | INTRAMUSCULAR | Status: DC | PRN
  Administered 2012-11-02 – 2012-11-04 (×17): 50 ug via INTRAVENOUS
  Administered 2012-11-04: 25 ug via INTRAVENOUS
  Administered 2012-11-04 – 2012-11-05 (×2): 50 ug via INTRAVENOUS
  Administered 2012-11-05: 25 ug via INTRAVENOUS
  Administered 2012-11-05: 50 ug via INTRAVENOUS
  Filled 2012-11-02 (×18): qty 2

## 2012-11-02 MED FILL — Heparin Sodium (Porcine) 100 Unt/ML in Sodium Chloride 0.45%: INTRAMUSCULAR | Qty: 250 | Status: AC

## 2012-11-02 MED FILL — Fentanyl Citrate Inj 0.05 MG/ML: INTRAMUSCULAR | Qty: 2 | Status: AC

## 2012-11-02 MED FILL — Vecuronium Bromide For Inj 10 MG: INTRAVENOUS | Qty: 10 | Status: AC

## 2012-11-02 NOTE — Progress Notes (Signed)
eLink Physician-Brief Progress Note Patient Name: [name] Christensen DOB: 03/28/1932 MRN: [id number]  Date of Service  11/01/2012   HPI/Events of Note  Patient has rewarmed per hypothermia protocol.  Remains on NE gtt for BP support  eICU Interventions  Plan: Parameters for weaning NE gtt modified to MAP of 65 or greater now that patient rewarmed.   Intervention Category Intermediate Interventions: Hypertension - evaluation and management  Kambria Grima 11/01/2012, 6:25 AM

## 2012-11-02 NOTE — Care Management Note (Signed)
Page 1 of 1   11/24/2012     3:47:03 PM   CARE MANAGEMENT NOTE 11/24/2012  Patient:  [name] Christensen, [name] Christensen   Account Number:  [id number]  Date Initiated:  10/25/2012  Documentation initiated by:  Junius Creamer  Subjective/Objective Assessment:   adm w cardiac arrest     Action/Plan:   lives w wife, pcp dr Posey Rea   Anticipated DC Date:     Anticipated DC Plan:        DC Planning Services  CM consult      Choice offered to / List presented to:             Status of service:   Medicare Important Message given?   (If response is "NO", the following Medicare IM given date fields will be blank) Date Medicare IM given:   Date Additional Medicare IM given:    Discharge Disposition:    Per UR Regulation:  Reviewed for med. necessity/level of care/duration of stay  If discussed at Long Length of Stay Meetings, dates discussed:    Comments:  12/9  15:46p debbie Daleon Willinger rn,bsn [phone]

## 2012-11-02 NOTE — Progress Notes (Signed)
ANTICOAGULATION CONSULT NOTE - Follow Up Consult  Pharmacy Consult for heparin Indication: ACS, s/p cardiac arrest  Allergies  Allergen Reactions  . Amoxicillin     REACTION: unspecified  . Ezetimibe     REACTION: numbness  . Niacin     REACTION: CP  . Penicillins   . Pravastatin Sodium   . Rosuvastatin     REACTION: aches   Patient Measurements: Height: 5\' 11"  (180.3 cm) Weight: 174 lb 6.1 oz (79.1 kg) IBW/kg (Calculated) : 75.3  Heparin Dosing Weight: 77kg  Vital Signs: Temp: 99.4 F (37.4 C) (12/09 1700) Temp src: Oral (12/09 1700) BP: 133/33 mmHg (12/09 1600) Pulse Rate: 89  (12/09 1700)  Labs:  Basename 11/12/2012 1700 11/14/2012 0755 10/27/2012 0345 10/26/2012 11/18/2012 2200 11/22/2012 1823 11/24/2012 1600 11/20/2012 0400 11/11/2012 0011 11/14/2012 1805  HGB -- -- 11.0* -- -- -- -- 11.2* -- --  HCT -- -- 31.2* -- -- -- -- 31.3* -- --  PLT -- -- 152 -- -- -- -- 124* -- --  APTT -- -- -- -- -- -- -- -- >200* 58*  LABPROT -- -- -- -- -- -- -- -- 15.8* 15.3*  INR -- -- -- -- -- -- -- -- 1.29 1.23  HEPARINUNFRC 0.12* -- 0.43 -- -- 0.74* -- -- -- --  CREATININE -- 0.94 0.86 0.79 -- -- -- -- -- --  CKTOTAL -- -- -- -- -- -- -- -- -- --  CKMB -- -- -- -- -- -- -- -- -- --  TROPONINI -- -- <0.30 -- <0.30 -- <0.30 -- -- --   Estimated Creatinine Clearance: 69 ml/min (by C-G formula based on Cr of 0.94).  Assessment: Patient is a 78 y.o M s/p cardiac arrest, placed on the hypothermia protocol.  He has now been re-warmed and remains intubated and not yet responsive.  His Heparin level has dropped to 0.12 (subtherapeutic) probably due to increased clearing after being rewarmed.  He had some bleeding noted at central line access site and hematuria 12/7 but now both are resolved.   Goal of Therapy:  Heparin level 0.3-0.7 units/ml Monitor platelets by anticoagulation protocol: Yes   Plan:   Bolus heparin 2000 units IV now and increase heparin to 1150 units/hr  F/U 8 hour level and  adjust as needed  Monitor daily HL, CBC and s/s of bleeding complications.  Christoper Fabian, PharmD, BCPS Clinical pharmacist, pager [phone] Thank you for allowing pharmacy to be part of this patients care team. 10/26/2012,6:30 PM

## 2012-11-02 NOTE — Progress Notes (Signed)
Have reviewed status with family

## 2012-11-02 NOTE — Progress Notes (Signed)
PULMONARY  / CRITICAL CARE MEDICINE  Name: [name] Christensen MRN: [id number] DOB: 06-Jan-1932    LOS: 2  CHIEF COMPLAINT:  Cardiac Arrest  BRIEF PATIENT DESCRIPTION:  78 yo male admitted to [facility] on 11/20/2012 with cardiac arrest, VDRF with subsequent transfer to [facility] for hypothermia protocol.  Significant PMHx of CAD s/p CABG, HTN, Hyperlipidemia, GERD, BPH  LINES / TUBES: ETT 12/7 >>> Rt IJ CVL 12/7>>> Rt radial aline 12/7>>>  CULTURES: Blood 12/08>>  ANTIBIOTICS: Cipro 12/08>>  EVENTS:  12/7 Out of hospital arrest, estimated 15 minutes down time 12/8- slight brady with cooling, low dose levo needs 12/9 Rewarmed  TESTS: 12/07 CT head >> Mild atrophy, chronic small vessel white matter ischemic changes.  Chronic bilateral ethmoid, frontal and sphenoid sinusitis. 2/08 Echo >> EF 40 to 45%, grade 2 diastolic dysfx  LEVEL OF CARE:  ICU PRIMARY SERVICE:  PCCM CONSULTANTS:  [facility] CODE STATUS: Full DIET:  NPO DVT Px:  Heparin gtt GI Px:  protonix   INTERVAL HISTORY:  Rewarmed overnight.  Intermittent PVC and bi-gemini.  VITAL SIGNS: Temp:  [91.2 F (32.9 C)-98.6 F (37 C)] 98.6 F (37 C) (12/09 0800) Pulse Rate:  [48-102] 102  (12/09 1000) Resp:  [10-20] 18  (12/09 1000) BP: (127-161)/(40-64) 127/40 mmHg (12/09 0930) SpO2:  [93 %-100 %] 97 % (12/09 1000) Arterial Line BP: (130-163)/(43-60) 163/51 mmHg (12/09 1000) FiO2 (%):  [50 %-60 %] 60 % (12/09 0930) Weight:  [174 lb 6.1 oz (79.1 kg)] 174 lb 6.1 oz (79.1 kg) (12/09 0500) HEMODYNAMICS: CVP:  [6 mmHg-11 mmHg] 11 mmHg VENTILATOR SETTINGS: Vent Mode:  [-] PRVC FiO2 (%):  [50 %-60 %] 60 % Set Rate:  [10 bmp-20 bmp] 20 bmp Vt Set:  [500 mL] 500 mL PEEP:  [8 cmH20] 8 cmH20 Plateau Pressure:  [16 cmH20-24 cmH20] 16 cmH20 INTAKE / OUTPUT: Intake/Output      12/08 0701 - 12/09 0700 12/09 0701 - 12/10 0700   I.V. (mL/kg) 2909.2 (36.8) 188.4 (2.4)   NG/GT 100    IV Piggyback 450    Total Intake(mL/kg)  3459.2 (43.7) 188.4 (2.4)   Urine (mL/kg/hr) 790 (0.4) 75   Emesis/NG output     Total Output 790 75   Net +2669.2 +113.4          CVP 11  PHYSICAL EXAMINATION: General: No distress Neuro:  Unresponsive, breaths over vent HEENT:  ETT in place Cardiovascular: s1s2 no murmur Lungs:  Scattered rhonchi Abdomen:  Soft, non tender, + bowel sounds Musculoskeletal:  No edema Skin: no rashes   LABS: Cbc  Lab 10/29/2012 0345 11/14/2012 0400  WBC 12.1* --  HGB 11.0* 11.2*  HCT 31.2* 31.3*  PLT 152 124*    Chemistry   Lab 10/29/2012 0755 10/30/2012 0345 11/16/2012 11/24/2012 1600  NA 129* 129* 128* --  K 4.1 3.6 3.6 --  CL 102 102 100 --  CO2 20 20 20  --  BUN 10 11 11  --  CREATININE 0.94 0.86 0.79 --  CALCIUM 7.8* 7.9* 7.6* --  MG -- -- -- 1.6  PHOS -- -- -- --  GLUCOSE 104* 109* 134* --    Liver fxn  Lab 08-Nov-2012 0345 10/29/2012 0411 10/29/2012 1805  AST 89* 209* 267*  ALT 139* 192* 239*  ALKPHOS 61 61 78  BILITOT 0.5 0.7 0.4  PROT 5.2* 5.0* 5.4*  ALBUMIN 2.3* 2.4* 2.7*   coags  Lab 11/13/2012 0011 11/17/2012 1805  APTT >200* 58*  INR 1.29 1.23  Sepsis markers  Lab 11/18/2012 1725 11/21/2012 1805  LATICACIDVEN 1.7 1.1  PROCALCITON -- --   Cardiac markers  Lab 11/20/2012 0345 10/27/2012 2200 11/17/2012 1600  CKTOTAL -- -- --  CKMB -- -- --  TROPONINI <0.30 <0.30 <0.30   BNP  Lab 11/15/2012 0410  PROBNP 1702.0*   ABG  Lab 10/25/2012 1747 11/17/2012 1604 11/19/2012 1839  PHART 7.244* 7.185* 7.325*  PCO2ART 42.9 52.2* 33.7*  PO2ART 52.0* 65.0* 62.0*  HCO3 19.5* 20.8 18.4*  TCO2 21 23 20     CBG trend  Lab 10/29/2012 0733 11/18/2012 0349 11/15/2012 2350 11/19/2012 1938 11/14/2012 1640  GLUCAP 90 109* 125* 130* 147*    IMAGING: CXR 12/09 >> pulmonary edema  DIAGNOSES: Active Problems:  HYPERLIPIDEMIA  HYPERTENSION  CORONARY ARTERY DISEASE  CORONARY ARTERY BYPASS GRAFT, HX OF  Sudden cardiac death  Acute respiratory failure   ASSESSMENT / PLAN:  PULMONARY  ASSESSMENT:   Acute respiratory failure 2nd to cardiac arrest and acute pulmonary edema.  PLAN:   Adjust Vt to 8 cc/kg = 610  F/u CXR Adjust PEEP/FiO2 to keep Spo2 > 92%  CARDIOVASCULAR  ASSESSMENT:  Presumed VF cardiac arrest. Acute on chronic systolic, diastolic CHF. Hx of CAD, HTN, Hyperlipidemia.  PLAN:  Continue heparin gtt Wean off pressors to keep MAP > 65  RENAL  ASSESSMENT:   Hyponatremia. Likely from volume overload.  PLAN:   Lasix 40 mg IV q8h x 2 doses 12/09  GASTROINTESTINAL  ASSESSMENT:   Elevated LFT's. Likely from cardiac arrest/shock. Nutrition.  PLAN:   F/u LFT's F/u hepatitis panel from 12/08 Start tube feeds while on vent  HEMATOLOGIC  ASSESSMENT:   Mild anemia.  PLAN:  F/u CBC  INFECTIOUS  ASSESSMENT:  ?UTI.  PLAN:   D1/x cipro  ENDOCRINE  ASSESSMENT:   Hyperglycemia 2nd to acute stress. No hx of DM.  PLAN:   SSI  NEUROLOGIC  ASSESSMENT:   Acute encephalopathy 2nd to cardiac arrest.  PLAN:   Monitor mental status after re-warming Change to intermittent sedation protocol  Updated family at bedside.  Critical care time 35 minutes.  Coralyn Helling, MD [facility] Pulmonary/Critical Care 10/27/2012, 10:28 AM Pager:  [phone] After 3pm call: [phone]

## 2012-11-02 NOTE — Progress Notes (Signed)
ANTICOAGULATION CONSULT NOTE - Follow Up Consult  Pharmacy Consult for heparin Indication: ACS, s/p cardiac arrest  Allergies  Allergen Reactions  . Amoxicillin     REACTION: unspecified  . Ezetimibe     REACTION: numbness  . Niacin     REACTION: CP  . Penicillins   . Pravastatin Sodium   . Rosuvastatin     REACTION: aches   Patient Measurements: Height: 5\' 11"  (180.3 cm) Weight: 174 lb 6.1 oz (79.1 kg) IBW/kg (Calculated) : 75.3  Heparin Dosing Weight: 77kg  Vital Signs: Temp: 98.6 F (37 C) (12/09 0800) Temp src: Oral (12/09 0800) BP: 128/61 mmHg (12/09 0800) Pulse Rate: 95  (12/09 0800)  Labs:  Basename 11/11/2012 0755 10/26/2012 0345 11/21/2012 11/22/2012 2200 11/04/2012 1823 11/20/2012 1600 11/24/2012 1100 11/12/2012 0400 10/30/2012 0011 10/27/2012 1805  HGB -- 11.0* -- -- -- -- -- 11.2* -- --  HCT -- 31.2* -- -- -- -- -- 31.3* -- --  PLT -- 152 -- -- -- -- -- 124* -- --  APTT -- -- -- -- -- -- -- -- >200* 58*  LABPROT -- -- -- -- -- -- -- -- 15.8* 15.3*  INR -- -- -- -- -- -- -- -- 1.29 1.23  HEPARINUNFRC -- 0.43 -- -- 0.74* -- 1.33* -- -- --  CREATININE 0.94 0.86 0.79 -- -- -- -- -- -- --  CKTOTAL -- -- -- -- -- -- -- -- -- --  CKMB -- -- -- -- -- -- -- -- -- --  TROPONINI -- <0.30 -- <0.30 -- <0.30 -- -- -- --   Estimated Creatinine Clearance: 69 ml/min (by C-G formula based on Cr of 0.94).  Assessment: Patient is a 78 y.o M s/p cardiac arrest, placed on the hypothermia protocol.  He has now been re-warmed and remains intubated and not yet responsive.  His Heparin level is currently within goal range with slight drop in rate (0.43 Iu/ml).  He had some bleeding noted at central line access site  and hematuria 12/7 but now both are resolved.  He will likely begin to clear his heparin faster and rate will need adjusting.  He is currently receiving ~ 11.3 units/kg TBW.    Goal of Therapy:  Heparin level 0.3-0.7 units/ml Monitor platelets by anticoagulation protocol: Yes   Plan:    Will continue current heparin rate of 900 units/hr  F/U 8 hour level and adjust as needed  Monitor daily HL, CBC and s/s of bleeding complications.  Nadara Mustard, PharmD., MS Clinical Pharmacist Pager:  [phone] Thank you for allowing pharmacy to be part of this patients care team. 11/14/2012,8:38 AM

## 2012-11-02 NOTE — Progress Notes (Addendum)
10cc versed & 225cc fentanyl wasted in sink - witnessed by 2nd RN;  witnessed

## 2012-11-02 NOTE — Progress Notes (Addendum)
Intake/Output Summary (Last 24 hours) at 11/14/2012 1015 Last data filed at 11/17/2012 1000  Gross per 24 hour  Intake 3422.36 ml  Output    880 ml  Net 2542.36 ml     Diet Order:   none   Supplements/Tube Feeding: none   IVF:    sodium chloride Last Rate: 20 mL/hr at 10/25/2012 0800  sodium chloride Last Rate: Stopped (11/24/2012 1000)  sodium chloride Last Rate: 20 mL/hr at 11/24/2012 0800  fentaNYL infusion INTRAVENOUS Last Rate: Stopped (11/22/2012 0640)  heparin Last Rate: 900 Units/hr (11/19/2012 2010)  midazolam (VERSED) infusion Last Rate: Stopped (10/30/2012 0640)  norepinephrine (LEVOPHED) Adult infusion Last Rate: 15 mcg/min (11/06/2012 0853)  [DISCONTINUED] cisatracurium (NIMBEX) infusion Last Rate: Stopped (11/07/2012 0420)  [DISCONTINUED] heparin Last Rate: 1,200 Units/hr (11/09/2012 1610)  [DISCONTINUED] norepinephrine (LEVOPHED) Adult infusion Last Rate: 30 mcg/min (10/28/2012  1300)  Patient is currently intubated on ventilator support.  MV: 12.6  Temp:Temp (24hrs), Avg:94.3 F (34.6 C), Min:91.2 F (32.9 C), Max:98.6 F (37 C)  Propofol: none    Estimated Nutritional Needs:   Kcal: 1831 Protein: 120-135 gm  Fluid: per team  Pt transferred [facility] for hypothermia protocol. Pt with witnessed arrest while eating, unknown down time. Pt now rewarmed from hypothermia protocol, recommend initiation of EN.  Pt with weight trending up from admission weight as well as being close to 20 lb over usual body weight, low sodium and albumin. Not on diuretic at this time. Recommend volume restricted tube feeding formula to avoid contributing to volume overload.   NUTRITION DIAGNOSIS: Inadequate oral intake related to inability to eat as evidenced by mechanical ventilation.    MONITORING/EVALUATION(Goals): Goal: Meet >/= 90% estimated nutrition needs with EN while intubated  Monitor: vent status, fluid status, weight trends, I/O's, labs  EDUCATION NEEDS: -No education needs identified at this time   Clarene Duke RD, LDN Pager [phone] After Hours pager [phone]  11/21/2012, 10:08 AM  INITIAL ADULT NUTRITION ASSESSMENT Date: 10/31/2012   Time: 10:08 AM   Addendum: RD consulted for initiation and management of EN. Will start TF per recommendation below.   Reason for Assessment: vent   INTERVENTION: 1. Clarify diet to NPO 2. Recommend initiation of EN, Osmolite 1.5 @ 20 ml/hr and advance by 10 ml q 4 hr to a goal rate of 40 ml/hr. 30 ml Pro-stat 4 times daily via OG tube. This EN regimen at goal rate will provide 1840 kcal, 120 gm protein, and 732 ml free water.  2. Recommend daily multivitamin per tube 3. RD will continue to follow     DOCUMENTATION CODES Per approved criteria  -Not Applicable     ASSESSMENT: Male 78 y.o.  Dx: VRDF r/t cardiac arrest  Hx:  Past Medical History  Diagnosis Date  . Coronary artery disease     s/p coronary bypass grafting 2005  . GERD (gastroesophageal reflux disease)   . Hyperlipidemia   . Hypertension   . BPH (benign prostatic hypertrophy)     elevated PSA Dr. Lindell Noe Bx 2010    Past Surgical History  Procedure Date  . Cardiac catheterization 2007    with patent graft anatomy atretic left internal mammary  artery to the LAD which is nonobstructive. Will restart study June 08, 2007  . Coronary artery bypass graft 2005  . Lumbar fusion 09/2007    Related Meds:     . antiseptic oral rinse  15 mL Mouth Rinse QID  . [EXPIRED] aspirin  300 mg Rectal NOW  . chlorhexidine  15 mL Mouth Rinse BID  . Chlorhexidine Gluconate Cloth  6 each Topical Q0600  . ciprofloxacin  400 mg Intravenous Q12H  . fentaNYL  100 mcg Intravenous Once  . insulin aspart  0-9 Units Subcutaneous Q4H  . [COMPLETED] magnesium sulfate 1 - 4 g bolus IVPB  2 g Intravenous Once  . midazolam  2 mg Intravenous Once  . mupirocin ointment  1 application Nasal BID  . pantoprazole (PROTONIX) IV  40 mg Intravenous Q24H  . [COMPLETED] sodium chloride  500 mL Intravenous Once  . [COMPLETED] sodium polystyrene  15 g Per Tube Once  . [DISCONTINUED]  artificial tears  1 application Both Eyes Q8H     Ht: 5\' 11"  (180.3 cm)  Wt: 174 lb 6.1 oz (79.1 kg)  Ideal Wt: 78.2 kg  % Ideal Wt: 101%  Usual Wt:  Wt Readings from Last 5 Encounters:  11/05/2012 174 lb 6.1 oz (79.1 kg)  09/29/12 157 lb (71.215 kg)  12/26/11 156 lb (70.761 kg)  07/19/11 159 lb (72.122 kg)  12/31/10 155 lb (70.308 kg)    % Usual Wt: 112%  Body mass index is 24.32 kg/(m^2). WNL   Food/Nutrition Related Hx: H&P indicates possible poor po intake PTA. Weight hx shows weight trending up from UBW.   Labs:  CMP     Component Value Date/Time   NA 129* 11/15/2012 0755   K 4.1 11/14/2012 0755   CL 102 11/09/2012 0755   CO2 20 11/15/2012 0755   GLUCOSE 104* 11/14/2012 0755   BUN 10 11/24/2012 0755   CREATININE 0.94 10/26/2012 0755   CALCIUM 7.8* 11/06/2012 0755   PROT 5.2* 10/28/2012 0345   ALBUMIN 2.3* 10/29/2012 0345   AST 89* 11/20/2012 0345   ALT 139* 11/18/2012 0345   ALKPHOS 61 11/15/2012 0345   BILITOT 0.5 11/09/2012 0345   GFRNONAA 78* 10/26/2012 0755   GFRAA >90 11/24/2012 1610

## 2012-11-02 NOTE — Progress Notes (Signed)
PROGRESS NOTE  Subjective:   Pt was admitted with cardiac arrest yesterday.  Hx of CAD, CABG. Witnessed arrest at home while eating. Presumed VF arrest.  EMS was called.  CPR was started 10 minutes after the witnessed arrest by EMS.  Transferred to [facility] ,  artic sun protocol. Still sedated.  Objective:    Vital Signs:   Temp:  [91.2 F (32.9 C)-97.7 F (36.5 C)] 97.7 F (36.5 C) (12/09 0600) Pulse Rate:  [48-82] 82  (12/09 0600) Resp:  [10-20] 20  (12/09 0600) SpO2:  [93 %-100 %] 100 % (12/09 0600) Arterial Line BP: (122-163)/(43-60) 150/45 mmHg (12/09 0600) FiO2 (%):  [50 %-60 %] 60 % (12/09 0400) Weight:  [174 lb 6.1 oz (79.1 kg)] 174 lb 6.1 oz (79.1 kg) (12/09 0500)  Last BM Date:  (PTA)   24-hour weight change: Weight change: 4 lb 6.6 oz (2 kg)  Weight trends: Filed Weights   11/22/2012 1615 11/19/2012 2000 11/22/2012 0500  Weight: 169 lb 15.6 oz (77.1 kg) 167 lb 8.8 oz (76 kg) 174 lb 6.1 oz (79.1 kg)    Intake/Output:  12/08 0701 - 12/09 0700 In: 3161.5 [I.V.:2811.5; NG/GT:100; IV Piggyback:250] Out: 790 [Urine:790] Total I/O In: 1610.8 [I.V.:1260.8; NG/GT:100; IV Piggyback:250] Out: 485 [Urine:485]   Physical Exam: BP 136/62  Pulse 82  Temp 97.7 F (36.5 C) (Core (Comment))  Resp 20  Ht 5\' 11"  (1.803 m)  Wt 174 lb 6.1 oz (79.1 kg)  BMI 24.32 kg/m2  SpO2 100%  General: Vital signs reviewed and noted.   Head: Normocephalic, atraumatic.  Eyes: conjunctivae/corneas clear.  EOM's intact.   Throat: normal  Neck:  left IJ in place  Lungs:  clear  Heart: RR  Abdomen:  Soft, non-tender, non-distended    Extremities: No edema   Neurologic: Sedated, on vent  Psych: Sedated, on vent    Labs: BMET:  Basename 10/25/2012 0345 10/27/2012 11/21/2012 1600  NA 129* 128* --  K 3.6 3.6 --  CL 102 100 --  CO2 20 20 --  GLUCOSE 109* 134* --  BUN 11 11 --  CREATININE 0.86 0.79 --  CALCIUM 7.9* 7.6* --  MG -- -- 1.6  PHOS -- -- --    Liver function  tests:  [facility] 10/26/2012 0345 10/30/2012 0411  AST 89* 209*  ALT 139* 192*  ALKPHOS 61 61  BILITOT 0.5 0.7  PROT 5.2* 5.0*  ALBUMIN 2.3* 2.4*   No results found for this basename: LIPASE:2,AMYLASE:2 in the last 72 hours  CBC:  Basename 10/25/2012 0345 11/16/2012 0400  WBC 12.1* 7.2  NEUTROABS 10.8* --  HGB 11.0* 11.2*  HCT 31.2* 31.3*  MCV 86.4 87.4  PLT 152 124*    Cardiac Enzymes:  Basename 11/21/2012 0345 11/22/2012 2200 10/25/2012 1600 11/17/2012 0410  CKTOTAL -- -- -- --  CKMB -- -- -- --  TROPONINI <0.30 <0.30 <0.30 0.49*    Coagulation Studies:  Basename 10/27/2012 0011 11/13/2012 1805  LABPROT 15.8* 15.3*  INR 1.29 1.23    Other: No components found with this basename: POCBNP:3 No results found for this basename: DDIMER in the last 72 hours  Basename 06-Nov-2012 2225  HGBA1C 6.3*   No results found for this basename: CHOL,HDL,LDLCALC,TRIG,CHOLHDL in the last 72 hours  Basename 06-Nov-2012 1805  TSH 1.360  T4TOTAL --  T3FREE --  THYROIDAB --   No results found for this basename: VITAMINB12,FOLATE,FERRITIN,TIBC,IRON,RETICCTPCT in the last 72 hours   Other results: Tele:  NSR  Medications:  Infusions:    . sodium chloride 5 mL/hr at 11/20/2012 2000  . sodium chloride Stopped (11/17/2012 0530)  . sodium chloride 20 mL/hr at 10/27/2012 2000  . fentaNYL infusion INTRAVENOUS 125 mcg/hr (11/24/2012 0400)  . heparin 900 Units/hr (Nov 09, 2012 2010)  . midazolam (VERSED) infusion 4 mg/hr (09-Nov-2012 2000)  . norepinephrine (LEVOPHED) Adult infusion 25 mcg/min (11/18/2012 0400)  . [DISCONTINUED] cisatracurium (NIMBEX) infusion Stopped (11/14/2012 0420)  . [DISCONTINUED] heparin 1,200 Units/hr (11/13/2012 1610)  . [DISCONTINUED] norepinephrine (LEVOPHED) Adult infusion 30 mcg/min (11/18/2012 1300)    Scheduled Medications:    . antiseptic oral rinse  15 mL Mouth Rinse QID  . [EXPIRED] aspirin  300 mg Rectal NOW  . chlorhexidine  15 mL Mouth Rinse BID  . Chlorhexidine Gluconate  Cloth  6 each Topical Q0600  . ciprofloxacin  400 mg Intravenous Q12H  . fentaNYL  100 mcg Intravenous Once  . insulin aspart  0-9 Units Subcutaneous Q4H  . [COMPLETED] magnesium sulfate 1 - 4 g bolus IVPB  2 g Intravenous Once  . midazolam  2 mg Intravenous Once  . mupirocin ointment  1 application Nasal BID  . pantoprazole (PROTONIX) IV  40 mg Intravenous Q24H  . [COMPLETED] sodium chloride  500 mL Intravenous Once  . [COMPLETED] sodium polystyrene  15 g Per Tube Once  . [DISCONTINUED] artificial tears  1 application Both Eyes Q8H  . [DISCONTINUED] metoprolol  5 mg Intravenous Q4H    Assessment/ Plan:    1. Cardiac arrest.  Getting Arctic sun protocol.  No acute ECG changes.  Continue warming.  2. CAD /CABG: will need a cath if he recovers from a neuro standpoint.  3. Chronic systolic CHF:  By echo.  His slight fall in EF may be due to this event.  Troponin elevations are very minimal.      Disposition:  Length of Stay: 2  Alvia Grove., MD, [facility] 10/30/2012, 6:39 AM Office [phone] Pager [phone]

## 2012-11-03 ENCOUNTER — Inpatient Hospital Stay (HOSPITAL_COMMUNITY): Payer: Medicare Other

## 2012-11-03 LAB — GLUCOSE, CAPILLARY
Glucose-Capillary: 138 mg/dL — ABNORMAL HIGH (ref 70–99)
Glucose-Capillary: 145 mg/dL — ABNORMAL HIGH (ref 70–99)
Glucose-Capillary: 133 mg/dL — ABNORMAL HIGH (ref 70–99)
Glucose-Capillary: 136 mg/dL — ABNORMAL HIGH (ref 70–99)

## 2012-11-03 LAB — CBC
HCT: 26.5 % — ABNORMAL LOW (ref 39.0–52.0)
Hemoglobin: 9.3 g/dL — ABNORMAL LOW (ref 13.0–17.0)
MCH: 30.4 pg (ref 26.0–34.0)
MCHC: 35.1 g/dL (ref 30.0–36.0)
MCV: 86.6 fL (ref 78.0–100.0)
Platelets: 153 10*3/uL (ref 150–400)
RBC: 3.06 MIL/uL — ABNORMAL LOW (ref 4.22–5.81)
RDW: 13 % (ref 11.5–15.5)
WBC: 15.3 10*3/uL — ABNORMAL HIGH (ref 4.0–10.5)

## 2012-11-03 LAB — HEPATIC FUNCTION PANEL
ALT: 93 U/L — ABNORMAL HIGH (ref 0–53)
AST: 54 U/L — ABNORMAL HIGH (ref 0–37)
Albumin: 2.2 g/dL — ABNORMAL LOW (ref 3.5–5.2)
Alkaline Phosphatase: 57 U/L (ref 39–117)
Bilirubin, Direct: 0.3 mg/dL (ref 0.0–0.3)
Indirect Bilirubin: 0.4 mg/dL (ref 0.3–0.9)
Total Bilirubin: 0.7 mg/dL (ref 0.3–1.2)
Total Protein: 5.2 g/dL — ABNORMAL LOW (ref 6.0–8.3)

## 2012-11-03 LAB — HEPARIN LEVEL (UNFRACTIONATED)
Heparin Unfractionated: 0.12 IU/mL — ABNORMAL LOW (ref 0.30–0.70)
Heparin Unfractionated: 0.12 IU/mL — ABNORMAL LOW (ref 0.30–0.70)
Heparin Unfractionated: 0.13 IU/mL — ABNORMAL LOW (ref 0.30–0.70)
Heparin Unfractionated: 0.12 IU/mL — ABNORMAL LOW (ref 0.30–0.70)

## 2012-11-03 LAB — BASIC METABOLIC PANEL WITH GFR
BUN: 17 mg/dL (ref 6–23)
CO2: 23 meq/L (ref 19–32)
Calcium: 7.9 mg/dL — ABNORMAL LOW (ref 8.4–10.5)
Chloride: 99 meq/L (ref 96–112)
Creatinine, Ser: 1.26 mg/dL (ref 0.50–1.35)
GFR calc Af Amer: 61 mL/min — ABNORMAL LOW
GFR calc non Af Amer: 53 mL/min — ABNORMAL LOW
Glucose, Bld: 122 mg/dL — ABNORMAL HIGH (ref 70–99)
Potassium: 4 meq/L (ref 3.5–5.1)
Sodium: 129 meq/L — ABNORMAL LOW (ref 135–145)

## 2012-11-03 LAB — POCT I-STAT 3, ART BLOOD GAS (G3+)
Bicarbonate: 23.9 mEq/L (ref 20.0–24.0)
O2 Saturation: 90 %
TCO2: 25 mmol/L (ref 0–100)
pCO2 arterial: 36.6 mmHg (ref 35.0–45.0)
pH, Arterial: 7.424 (ref 7.350–7.450)
pO2, Arterial: 58 mmHg — ABNORMAL LOW (ref 80.0–100.0)

## 2012-11-03 MED ORDER — HEPARIN BOLUS VIA INFUSION
2000.0000 [IU] | Freq: Once | INTRAVENOUS | Status: AC
  Administered 2012-11-03: 2000 [IU] via INTRAVENOUS
  Filled 2012-11-03: qty 2000

## 2012-11-03 MED ORDER — FUROSEMIDE 10 MG/ML IJ SOLN
40.0000 mg | Freq: Three times a day (TID) | INTRAMUSCULAR | Status: AC
  Administered 2012-11-03: 40 mg via INTRAVENOUS
  Filled 2012-11-03: qty 4

## 2012-11-03 MED ORDER — HEPARIN BOLUS VIA INFUSION
3000.0000 [IU] | Freq: Once | INTRAVENOUS | Status: AC
  Administered 2012-11-03: 3000 [IU] via INTRAVENOUS
  Filled 2012-11-03: qty 3000

## 2012-11-03 MED ORDER — HEPARIN BOLUS VIA INFUSION
5000.0000 [IU] | Freq: Once | INTRAVENOUS | Status: AC
  Administered 2012-11-03: 5000 [IU] via INTRAVENOUS
  Filled 2012-11-03: qty 5000

## 2012-11-03 MED ORDER — FUROSEMIDE 10 MG/ML IJ SOLN
INTRAMUSCULAR | Status: AC
  Administered 2012-11-03: 40 mg
  Filled 2012-11-03: qty 4

## 2012-11-03 NOTE — Plan of Care (Signed)
Problem: Phase III Progression Outcomes Goal: Mental status at or near baseline Outcome: Not Met (add Reason) Pt orientation questionable at this time

## 2012-11-03 NOTE — Progress Notes (Signed)
PULMONARY  / CRITICAL CARE MEDICINE  Name: [name] Christensen MRN: [id number] DOB: 1932/03/08    LOS: 3  CHIEF COMPLAINT:  Cardiac Arrest  BRIEF PATIENT DESCRIPTION:  78 yo male admitted to [facility] on 11/21/2012 with cardiac arrest, VDRF with subsequent transfer to [facility] for hypothermia protocol.  Significant PMHx of CAD s/p CABG, HTN, Hyperlipidemia, GERD, BPH  LINES / TUBES: ETT 12/7 >>> Rt IJ CVL 12/7>>> Rt radial aline 12/7>>>12/10  CULTURES: MRSA screen 12/07>>POSITIVE Blood 12/08>>  ANTIBIOTICS: Cipro 12/08>>  EVENTS:  12/7 Out of hospital arrest, estimated 15 minutes down time 12/8- slight brady with cooling, low dose levo needs 12/9 Rewarmed 12/10 Off pressors  TESTS: 12/07 CT head >> Mild atrophy, chronic small vessel white matter ischemic changes.  Chronic bilateral ethmoid, frontal and sphenoid sinusitis. 2/08 Echo >> EF 40 to 45%, grade 2 diastolic dysfx  LEVEL OF CARE:  ICU PRIMARY SERVICE:  PCCM CONSULTANTS:  [facility] CODE STATUS: Full DIET:  Tube feeds DVT Px:  Heparin gtt GI Px:  protonix   INTERVAL HISTORY:  Tolerating pressure support.    VITAL SIGNS: Temp:  [98.4 F (36.9 C)-101.9 F (38.8 C)] 99 F (37.2 C) (12/10 0818) Pulse Rate:  [46-120] 120  (12/10 0818) Resp:  [13-22] 21  (12/10 0818) BP: (105-183)/(33-71) 156/71 mmHg (12/10 0818) SpO2:  [95 %-100 %] 96 % (12/10 0818) Arterial Line BP: (108-215)/(38-62) 122/40 mmHg (12/10 0700) FiO2 (%):  [40 %-60 %] 40 % (12/10 0816) Weight:  [169 lb 5 oz (76.8 kg)] 169 lb 5 oz (76.8 kg) (12/10 0400) HEMODYNAMICS: CVP:  [4 mmHg-8 mmHg] 6 mmHg VENTILATOR SETTINGS: Vent Mode:  [-] PSV FiO2 (%):  [40 %-60 %] 40 % Set Rate:  [14 bmp] 14 bmp Vt Set:  [610 mL] 610 mL PEEP:  [5 cmH20-8 cmH20] 5 cmH20 Pressure Support:  [5 cmH20] 5 cmH20 Plateau Pressure:  [17 cmH20-24 cmH20] 22 cmH20 INTAKE / OUTPUT: Intake/Output      12/09 0701 - 12/10 0700 12/10 0701 - 12/11 0700   I.V. (mL/kg) 1824.7  (23.8) 55 (0.7)   NG/GT 460 40   IV Piggyback 404    Total Intake(mL/kg) 2688.7 (35) 95 (1.2)   Urine (mL/kg/hr) 4350 (2.4)    Total Output 4350    Net -1661.4 +95          CVP 7  PHYSICAL EXAMINATION: General: No distress Neuro:  Moves all extremities spontaneously, open's eyes spontaneously, not following commands HEENT:  ETT in place Cardiovascular: s1s2 no murmur Lungs:  Scattered rhonchi Abdomen:  Soft, non tender, + bowel sounds Musculoskeletal:  No edema Skin: no rashes   LABS: Cbc  Lab 11/17/2012 0200 10/30/2012 0345 11/17/2012 0400  WBC 15.3* -- --  HGB 9.3* 11.0* 11.2*  HCT 26.5* 31.2* 31.3*  PLT 153 152 124*    Chemistry   Lab 11/18/2012 0200 11/19/2012 1445 11/15/2012 0755 11/16/2012 0345 10/28/2012 1600  NA 129* -- 129* 129* --  K 4.0 -- 4.1 3.6 --  CL 99 -- 102 102 --  CO2 23 -- 20 20 --  BUN 17 -- 10 11 --  CREATININE 1.26 -- 0.94 0.86 --  CALCIUM 7.9* -- 7.8* 7.9* --  MG -- 1.5 -- -- 1.6  PHOS -- -- -- -- --  GLUCOSE 122* -- 104* 109* --    Liver fxn  Lab 11/13/2012 0200 10/30/2012 0345 11/16/2012 0411  AST 54* 89* 209*  ALT 93* 139* 192*  ALKPHOS 57 61 61  BILITOT 0.7 0.5 0.7  PROT 5.2* 5.2* 5.0*  ALBUMIN 2.2* 2.3* 2.4*   coags  Lab 11/16/2012 0011 11/15/2012 1805  APTT >200* 58*  INR 1.29 1.23   Sepsis markers  Lab 11/18/2012 1725 10/27/2012 1805  LATICACIDVEN 1.7 1.1  PROCALCITON -- --   Cardiac markers  Lab 10/29/2012 0345 10/30/2012 2200 10/26/2012 1600  CKTOTAL -- -- --  CKMB -- -- --  TROPONINI <0.30 <0.30 <0.30   BNP  Lab 10/25/2012 0410  PROBNP 1702.0*   ABG  Lab 11/15/2012 1747 10/28/2012 1604 11/18/2012 1839  PHART 7.244* 7.185* 7.325*  PCO2ART 42.9 52.2* 33.7*  PO2ART 52.0* 65.0* 62.0*  HCO3 19.5* 20.8 18.4*  TCO2 21 23 20     CBG trend  Lab 11/14/2012 0828 11/16/2012 0334 10/28/2012 2331 11/16/2012 1922 10/25/2012 1631  GLUCAP 145* 138* 123* 99 99    IMAGING: Dg Chest Port 1 View  10/27/2012  *RADIOLOGY REPORT*  Clinical Data: Shortness of  breath.  Follow-up evaluation for congestive heart failure.  PORTABLE CHEST - 1 VIEW  Comparison: Chest x-ray 11/24/2012.  Findings: An endotracheal tube is in place with tip 6.9 cm above the carina. There is a right-sided internal jugular central venous catheter with tip terminating in the mid superior vena cava. A nasogastric tube is seen extending into the stomach, however, the tip of the nasogastric tube extends below the lower margin of the image.  Lung volumes are slightly low.  There are persistent bibasilar opacities which may reflect areas of atelectasis and/or consolidation.  Trace bilateral pleural effusions have decreased. There is cephalization of the pulmonary vasculature, indistinctness of the interstitial markings, and patchy airspace disease throughout the lungs bilaterally suggestive of moderate pulmonary edema.  Mild cardiomegaly is unchanged. The patient is rotated to the right on today's exam, resulting in distortion of the mediastinal contours and reduced diagnostic sensitivity and specificity for mediastinal pathology.  Atherosclerosis in the thoracic aorta. Postoperative changes of median sternotomy for CABG.  IMPRESSION: 1.  Support apparatus, as above. 2.  Findings are again compatible with congestive heart failure; however, overall aeration has slightly improved. 3.  Persistent low lung volumes with bibasilar atelectasis and/or consolidation.   Original Report Authenticated By: Trudie Reed, M.D.    DIAGNOSES: Active Problems:  HYPERLIPIDEMIA  HYPERTENSION  CORONARY ARTERY DISEASE  CORONARY ARTERY BYPASS GRAFT, HX OF  Sudden cardiac death  Acute respiratory failure   ASSESSMENT / PLAN:  PULMONARY  ASSESSMENT:  Acute respiratory failure 2nd to cardiac arrest and acute pulmonary edema.  PLAN:   Pressure support wean as tolerated>>mental status barrier to extubation F/u CXR Adjust PEEP/FiO2 to keep Spo2 > 92%  CARDIOVASCULAR  ASSESSMENT:  Presumed VF cardiac  arrest. Acute on chronic systolic, diastolic CHF. Hx of CAD, HTN, Hyperlipidemia.  PLAN:  Continue heparin gtt Cardiac cath if mental status improves  RENAL  ASSESSMENT:   Hyponatremia. Likely from volume overload.  PLAN:   Lasix 40 mg IV q8h x 2 doses 12/10  GASTROINTESTINAL  ASSESSMENT:   Elevated LFT's.  Hepatitis C Ab reactive from 10/30/2012. Likely from cardiac arrest/shock.  Improving. Nutrition.  PLAN:   F/u LFT's Check HCV PCR Continue tube feeds while on vent  HEMATOLOGIC  ASSESSMENT:   Mild anemia.  PLAN:  F/u CBC  INFECTIOUS  ASSESSMENT:  ?UTI.  PLAN:   D2/5 cipro  ENDOCRINE  ASSESSMENT:   Hyperglycemia 2nd to acute stress. No hx of DM.  PLAN:   SSI  NEUROLOGIC  ASSESSMENT:   Acute  encephalopathy 2nd to cardiac arrest. Has spontaneous movements 12/10>>not following commands.  PLAN:   Monitor mental status Intermittent sedation protocol while on vent  Updated family at bedside.  Critical care time 35 minutes.  Coralyn Helling, MD [facility] Pulmonary/Critical Care 11/16/2012, 9:21 AM Pager:  [phone] After 3pm call: [phone]

## 2012-11-03 NOTE — Progress Notes (Addendum)
ANTICOAGULATION CONSULT NOTE - Follow Up Consult  Pharmacy Consult for heparin Indication: s/p cardiac arrest  Labs:  Basename 11/24/2012 1300 11/19/2012 0950 11/18/2012 0200 10/29/2012 0755 10/30/2012 0345 11/07/2012 2200 11/23/2012 1600 11/21/2012 0400 11/22/2012 0011 10/30/2012 1805  HGB -- -- 9.3* -- 11.0* -- -- -- -- --  HCT -- -- 26.5* -- 31.2* -- -- 31.3* -- --  PLT -- -- 153 -- 152 -- -- 124* -- --  APTT -- -- -- -- -- -- -- -- >200* 58*  LABPROT -- -- -- -- -- -- -- -- 15.8* 15.3*  INR -- -- -- -- -- -- -- -- 1.29 1.23  HEPARINUNFRC 0.13* 0.12* 0.12* -- -- -- -- -- -- --  CREATININE -- -- 1.26 0.94 0.86 -- -- -- -- --  CKTOTAL -- -- -- -- -- -- -- -- -- --  CKMB -- -- -- -- -- -- -- -- -- --  TROPONINI -- -- -- -- <0.30 <0.30 <0.30 -- -- --    Assessment: 78yo male remains subtherapeutic on heparin despite aggressive dose increase.  Heparin level was 0.12 IU/ml this morning which is essentially unchanged.  This was drawn from the arterial line and I asked for a peripheral stick just to be certain that the level was accurate.  Repeat level was 0.13 IU/ml.  He is currently at a rate of 1500 units/hr which is a 60% increase from his therapeutic level while on the hypothermia protocol (understandably requirements are very low during this process).  He is on ~ 19.4 units/kg TBW.  I have discussed therapy with his nurse and there have been no IV complications and he is without noted bleeding.  Goal of Therapy:  Heparin level 0.3-0.7 units/ml   Plan:  Will rebolus with heparin 5000 units x1 and increase his IV rate to 1650 units/hr and check level in 6-8hr.  This will put him at 21.4 units/hr.  Would check an AT-III level if his next level is low to validate the high requirements.  Nadara Mustard, PharmD., MS Clinical Pharmacist Pager:  [phone] Thank you for allowing pharmacy to be part of this patients care team. 11/22/2012,2:31 PM

## 2012-11-03 NOTE — Progress Notes (Signed)
PROGRESS NOTE  Subjective:   Pt was admitted with cardiac arrest yesterday.  Hx of CAD, CABG. Witnessed arrest at home while eating. Presumed VF arrest.  EMS was called.  CPR was started 10 minutes after the witnessed arrest by EMS.  Transferred to [facility] ,  artic sun protocol.  He has been rewarmed.  He is moving spontaneously but not command.  Still on vent.  Objective:    Vital Signs:   Temp:  [98.4 F (36.9 C)-101.9 F (38.8 C)] 99.1 F (37.3 C) (12/10 0700) Pulse Rate:  [46-114] 107  (12/10 0700) Resp:  [13-22] 13  (12/10 0700) BP: (105-183)/(33-63) 105/49 mmHg (12/10 0700) SpO2:  [95 %-100 %] 98 % (12/10 0700) Arterial Line BP: (108-215)/(38-62) 122/40 mmHg (12/10 0700) FiO2 (%):  [40 %-60 %] 40 % (12/10 0458) Weight:  [169 lb 5 oz (76.8 kg)] 169 lb 5 oz (76.8 kg) (12/10 0400)  Last BM Date:  (PTA)   24-hour weight change: Weight change: -5 lb 1.1 oz (-2.3 kg)  Weight trends: Filed Weights   11/18/12 2000 10/30/2012 0500 11/24/2012 0400  Weight: 167 lb 8.8 oz (76 kg) 174 lb 6.1 oz (79.1 kg) 169 lb 5 oz (76.8 kg)    Intake/Output:  12/09 0701 - 12/10 0700 In: 2688.7 [I.V.:1824.7; NG/GT:460; IV Piggyback:404] Out: 4350 [Urine:4350]     Physical Exam: BP 105/49  Pulse 107  Temp 99.1 F (37.3 C) (Core (Comment))  Resp 13  Ht 5\' 11"  (1.803 m)  Wt 169 lb 5 oz (76.8 kg)  BMI 23.61 kg/m2  SpO2 98%  General: Vital signs reviewed and noted.   Head: Normocephalic, atraumatic.  Eyes: conjunctivae/corneas clear.  EOM's intact.   Throat: normal  Neck:  left IJ in place  Lungs:  clear  Heart: RR  Abdomen:  Soft, non-tender, non-distended    Extremities: No edema   Neurologic: , on vent, no cooperative       Labs: BMET:  Basename 10/29/2012 0200 11/24/2012 1445 11/16/2012 0755 11/13/2012 1600  NA 129* -- 129* --  K 4.0 -- 4.1 --  CL 99 -- 102 --  CO2 23 -- 20 --  GLUCOSE 122* -- 104* --  BUN 17 -- 10 --  CREATININE 1.26 -- 0.94 --  CALCIUM 7.9* -- 7.8* --  MG --  1.5 -- 1.6  PHOS -- -- -- --    Liver function tests:  Basename 10/30/2012 0200 11/13/2012 0345  AST 54* 89*  ALT 93* 139*  ALKPHOS 57 61  BILITOT 0.7 0.5  PROT 5.2* 5.2*  ALBUMIN 2.2* 2.3*   No results found for this basename: LIPASE:2,AMYLASE:2 in the last 72 hours  CBC:  Basename 11/22/2012 0200 10/30/2012 0345  WBC 15.3* 12.1*  NEUTROABS -- 10.8*  HGB 9.3* 11.0*  HCT 26.5* 31.2*  MCV 86.6 86.4  PLT 153 152    Cardiac Enzymes:  Basename 11/19/2012 0345 11/24/2012 2200 11/19/2012 1600 11/17/2012 0410  CKTOTAL -- -- -- --  CKMB -- -- -- --  TROPONINI <0.30 <0.30 <0.30 0.49*    Coagulation Studies:  Basename 11/18/2012 0011 18-Nov-2012 1805  LABPROT 15.8* 15.3*  INR 1.29 1.23    Other: No components found with this basename: POCBNP:3 No results found for this basename: DDIMER in the last 72 hours  Basename 2012/11/18 2225  HGBA1C 6.3*   No results found for this basename: CHOL,HDL,LDLCALC,TRIG,CHOLHDL in the last 72 hours  Basename 11-18-2012 1805  TSH 1.360  T4TOTAL --  T3FREE --  THYROIDAB --   No results found for this basename: VITAMINB12,FOLATE,FERRITIN,TIBC,IRON,RETICCTPCT in the last 72 hours   Other results: Tele:  NSR  Medications:    Infusions:    . sodium chloride 20 mL/hr at 11/24/2012 2000  . sodium chloride Stopped (11/24/2012 2000)  . sodium chloride 20 mL/hr at 11/14/2012 2000  . heparin 1,500 Units/hr (11/17/2012 0300)  . norepinephrine (LEVOPHED) Adult infusion Stopped (11/16/2012 0610)  . [DISCONTINUED] fentaNYL infusion INTRAVENOUS Stopped (11/19/2012 0640)  . [DISCONTINUED] midazolam (VERSED) infusion Stopped (11/14/2012 0640)    Scheduled Medications:    . antiseptic oral rinse  15 mL Mouth Rinse QID  . chlorhexidine  15 mL Mouth Rinse BID  . Chlorhexidine Gluconate Cloth  6 each Topical Q0600  . ciprofloxacin  400 mg Intravenous Q12H  . feeding supplement (OSMOLITE 1.5 CAL)  1,000 mL Per Tube Q24H  . feeding supplement  30 mL Per Tube QID  .  [COMPLETED] furosemide  40 mg Intravenous Q8H  . [COMPLETED] heparin  2,000 Units Intravenous Once  . [COMPLETED] heparin  2,000 Units Intravenous Once  . insulin aspart  0-9 Units Subcutaneous Q4H  . mupirocin ointment  1 application Nasal BID  . pantoprazole sodium  40 mg Per Tube Q24H  . [COMPLETED] potassium chloride  40 mEq Per Tube Q8H  . [DISCONTINUED] fentaNYL  100 mcg Intravenous Once  . [DISCONTINUED] midazolam  2 mg Intravenous Once  . [DISCONTINUED] pantoprazole (PROTONIX) IV  40 mg Intravenous Q24H    Assessment/ Plan:    1. Cardiac arrest.  Getting Arctic sun protocol.  No acute ECG changes.  Continue warming.  2. CAD /CABG: will need a cath if he recovers from a neuro standpoint.  3. Chronic systolic CHF:  By echo.  His slight fall in EF may be due to this event.  Troponin elevations are very minimal.     4. Neuro:  No purposeful movement or response at this point.  Plans per PCCM / neuro team.   Disposition:  Length of Stay: 3  Vesta Mixer, Montez Hageman., MD, [facility] 11/15/2012, 8:05 AM Office [phone] Pager [phone]

## 2012-11-03 NOTE — Progress Notes (Signed)
ANTICOAGULATION CONSULT NOTE - Follow Up Consult  Pharmacy Consult for heparin Indication: s/p cardiac arrest  Labs:  Basename 11/19/2012 2009 10/26/2012 1300 11/09/2012 0950 11/23/2012 0200 11/21/2012 0755 11/06/2012 0345 11/22/2012 2200 10/28/2012 1600 11/08/2012 0400 11/17/2012 0011  HGB -- -- -- 9.3* -- 11.0* -- -- -- --  HCT -- -- -- 26.5* -- 31.2* -- -- 31.3* --  PLT -- -- -- 153 -- 152 -- -- 124* --  APTT -- -- -- -- -- -- -- -- -- >200*  LABPROT -- -- -- -- -- -- -- -- -- 15.8*  INR -- -- -- -- -- -- -- -- -- 1.29  HEPARINUNFRC 0.12* 0.13* 0.12* -- -- -- -- -- -- --  CREATININE -- -- -- 1.26 0.94 0.86 -- -- -- --  CKTOTAL -- -- -- -- -- -- -- -- -- --  CKMB -- -- -- -- -- -- -- -- -- --  TROPONINI -- -- -- -- -- <0.30 <0.30 <0.30 -- --    Assessment: 78yo male remains subtherapeutic on heparin despite aggressive dose increase.  Heparin level is 0.13 IU/ml this evening which is essentially unchanged.    Goal of Therapy:  Heparin level 0.3-0.7 units/ml   Plan:  Will rebolus with heparin 3000 units x1 and increase his IV rate to 1850 units/hr and check level in 6-8hr.  Will check an AT-III level with his next level  Thank you. Okey Regal, PharmD [phone]  10/31/2012,8:56 PM

## 2012-11-03 NOTE — Progress Notes (Signed)
ANTICOAGULATION CONSULT NOTE - Follow Up Consult  Pharmacy Consult for heparin Indication: s/p cardiac arrest  Labs:  Basename 11/04/2012 0200 11/19/2012 1700 11/22/2012 0755 11/23/2012 0345 11/22/2012 2200 10/30/2012 1600 11/15/2012 0400 11/15/2012 0011 10/30/2012 1805  HGB 9.3* -- -- 11.0* -- -- -- -- --  HCT 26.5* -- -- 31.2* -- -- 31.3* -- --  PLT 153 -- -- 152 -- -- 124* -- --  APTT -- -- -- -- -- -- -- >200* 58*  LABPROT -- -- -- -- -- -- -- 15.8* 15.3*  INR -- -- -- -- -- -- -- 1.29 1.23  HEPARINUNFRC 0.12* 0.12* -- 0.43 -- -- -- -- --  CREATININE 1.26 -- 0.94 0.86 -- -- -- -- --  CKTOTAL -- -- -- -- -- -- -- -- --  CKMB -- -- -- -- -- -- -- -- --  TROPONINI -- -- -- <0.30 <0.30 <0.30 -- -- --    Assessment: 78yo male remains subtherapeutic on heparin with no change in level despite bolus and rate increase after rewarming; bleeding resolved ~12hr ago with no further signs of bleeding.  Goal of Therapy:  Heparin level 0.3-0.7 units/ml   Plan:  Will rebolus with heparin 2000 units x1 and increase gtt by 4 units/kg/hr to 1500 units/hr and check level in 6-8hr.  Colleen Can PharmD BCPS 10/29/2012,2:58 AM

## 2012-11-04 ENCOUNTER — Inpatient Hospital Stay (HOSPITAL_COMMUNITY): Payer: Medicare Other

## 2012-11-04 LAB — BASIC METABOLIC PANEL
BUN: 23 mg/dL (ref 6–23)
CO2: 30 mEq/L (ref 19–32)
Calcium: 8.7 mg/dL (ref 8.4–10.5)
Chloride: 94 mEq/L — ABNORMAL LOW (ref 96–112)
Creatinine, Ser: 1.12 mg/dL (ref 0.50–1.35)
GFR calc Af Amer: 71 mL/min — ABNORMAL LOW (ref >90)
GFR calc non Af Amer: 61 mL/min — ABNORMAL LOW (ref >90)
Glucose, Bld: 157 mg/dL — ABNORMAL HIGH (ref 70–99)
Potassium: 2.8 mEq/L — ABNORMAL LOW (ref 3.5–5.1)
Sodium: 131 mEq/L — ABNORMAL LOW (ref 135–145)

## 2012-11-04 LAB — CBC
HCT: 24.3 % — ABNORMAL LOW (ref 39.0–52.0)
Hemoglobin: 8.5 g/dL — ABNORMAL LOW (ref 13.0–17.0)
MCH: 29.8 pg (ref 26.0–34.0)
MCHC: 35 g/dL (ref 30.0–36.0)
MCV: 85.3 fL (ref 78.0–100.0)
Platelets: 143 10*3/uL — ABNORMAL LOW (ref 150–400)
RBC: 2.85 MIL/uL — ABNORMAL LOW (ref 4.22–5.81)
RDW: 13.1 % (ref 11.5–15.5)
WBC: 12 10*3/uL — ABNORMAL HIGH (ref 4.0–10.5)

## 2012-11-04 LAB — GLUCOSE, CAPILLARY
Glucose-Capillary: 143 mg/dL — ABNORMAL HIGH (ref 70–99)
Glucose-Capillary: 147 mg/dL — ABNORMAL HIGH (ref 70–99)
Glucose-Capillary: 155 mg/dL — ABNORMAL HIGH (ref 70–99)
Glucose-Capillary: 141 mg/dL — ABNORMAL HIGH (ref 70–99)
Glucose-Capillary: 150 mg/dL — ABNORMAL HIGH (ref 70–99)
Glucose-Capillary: 140 mg/dL — ABNORMAL HIGH (ref 70–99)
Glucose-Capillary: 147 mg/dL — ABNORMAL HIGH (ref 70–99)

## 2012-11-04 LAB — HEPATITIS C VRS RNA DETECT BY PCR-QUAL: Hepatitis C Vrs RNA by PCR-Qual: NEGATIVE

## 2012-11-04 LAB — ANTITHROMBIN III: AntiThromb III Func: 32 % — ABNORMAL LOW (ref 75–120)

## 2012-11-04 LAB — HEPARIN LEVEL (UNFRACTIONATED): Heparin Unfractionated: 0.31 IU/mL (ref 0.30–0.70)

## 2012-11-04 MED ORDER — HEPARIN SODIUM (PORCINE) 5000 UNIT/ML IJ SOLN
5000.0000 [IU] | Freq: Three times a day (TID) | INTRAMUSCULAR | Status: DC
  Administered 2012-11-04 – 2012-11-10 (×18): 5000 [IU] via SUBCUTANEOUS
  Filled 2012-11-04 (×22): qty 1

## 2012-11-04 MED ORDER — LEVOFLOXACIN IN D5W 750 MG/150ML IV SOLN
750.0000 mg | INTRAVENOUS | Status: DC
  Administered 2012-11-04 – 2012-11-09 (×6): 750 mg via INTRAVENOUS
  Filled 2012-11-04 (×8): qty 150

## 2012-11-04 MED ORDER — POTASSIUM CHLORIDE 20 MEQ/15ML (10%) PO LIQD
40.0000 meq | Freq: Once | ORAL | Status: AC
  Administered 2012-11-04: 40 meq
  Filled 2012-11-04: qty 30

## 2012-11-04 MED ORDER — METOPROLOL TARTRATE 25 MG/10 ML ORAL SUSPENSION
25.0000 mg | Freq: Two times a day (BID) | ORAL | Status: DC
  Administered 2012-11-04 (×2): 25 mg
  Filled 2012-11-04 (×4): qty 10

## 2012-11-04 MED ORDER — VANCOMYCIN HCL 1000 MG IV SOLR
750.0000 mg | Freq: Two times a day (BID) | INTRAVENOUS | Status: DC
  Administered 2012-11-04 – 2012-11-05 (×4): 750 mg via INTRAVENOUS
  Filled 2012-11-04 (×6): qty 750

## 2012-11-04 MED ORDER — ASPIRIN 81 MG PO CHEW
81.0000 mg | CHEWABLE_TABLET | Freq: Every day | ORAL | Status: DC
  Administered 2012-11-04 – 2012-11-12 (×8): 81 mg via ORAL
  Filled 2012-11-04 (×7): qty 1
  Filled 2012-11-04: qty 2
  Filled 2012-11-04: qty 1

## 2012-11-04 MED ORDER — FUROSEMIDE 8 MG/ML PO SOLN
40.0000 mg | Freq: Once | ORAL | Status: AC
  Administered 2012-11-04: 40 mg
  Filled 2012-11-04: qty 5

## 2012-11-04 MED ORDER — POTASSIUM CHLORIDE 20 MEQ/15ML (10%) PO LIQD
ORAL | Status: AC
  Administered 2012-11-04: 40 meq
  Filled 2012-11-04: qty 15

## 2012-11-04 MED ORDER — POTASSIUM CHLORIDE 20 MEQ/15ML (10%) PO LIQD
40.0000 meq | ORAL | Status: AC
  Administered 2012-11-04 (×3): 40 meq
  Filled 2012-11-04 (×4): qty 30

## 2012-11-04 NOTE — Progress Notes (Signed)
ANTIBIOTIC CONSULT NOTE - INITIAL  Pharmacy Consult for Vancomycin and Levaquin Indication: pneumonia and possible UTI  Allergies  Allergen Reactions  . Amoxicillin     REACTION: unspecified  . Ezetimibe     REACTION: numbness  . Niacin     REACTION: CP  . Penicillins   . Pravastatin Sodium   . Rosuvastatin     REACTION: aches    Patient Measurements: Height: 5\' 11"  (180.3 cm) Weight: 167 lb 5.3 oz (75.9 kg) IBW/kg (Calculated) : 75.3   Vital Signs: Temp: 99.8 F (37.7 C) (12/11 0800) Temp src: Axillary (12/11 0800) BP: 170/65 mmHg (12/11 0900) Pulse Rate: 71  (12/11 0900) Intake/Output from previous day: 12/10 0701 - 12/11 0700 In: 2329.2 [I.V.:985.2; NG/GT:990; IV Piggyback:354] Out: 4630 [Urine:4600; Emesis/NG output:30] Intake/Output from this shift: Total I/O In: 78 [I.V.:78] Out: -   Labs:  Basename 10/29/2012 0500 10/28/2012 0200 11/13/2012 0755 11/24/2012 0345  WBC 12.0* 15.3* -- 12.1*  HGB 8.5* 9.3* -- 11.0*  PLT 143* 153 -- 152  LABCREA -- -- -- --  CREATININE 1.12 1.26 0.94 --   Estimated Creatinine Clearance: 57.9 ml/min (by C-G formula based on Cr of 1.12). No results found for this basename: VANCOTROUGH:2,VANCOPEAK:2,VANCORANDOM:2,GENTTROUGH:2,GENTPEAK:2,GENTRANDOM:2,TOBRATROUGH:2,TOBRAPEAK:2,TOBRARND:2,AMIKACINPEAK:2,AMIKACINTROU:2,AMIKACIN:2, in the last 72 hours   Microbiology: Recent Results (from the past 720 hour(s))  MRSA PCR SCREENING     Status: Abnormal   Collection Time   10/26/2012  9:04 PM      Component Value Range Status Comment   MRSA by PCR POSITIVE (*) NEGATIVE Final   CULTURE, BLOOD (ROUTINE X 2)     Status: Normal (Preliminary result)   Collection Time   11/16/2012  5:00 PM      Component Value Range Status Comment   Specimen Description BLOOD LEFT HAND   Final    Special Requests BOTTLES DRAWN AEROBIC ONLY 6CC   Final    Culture  Setup Time 11/15/2012 20:58   Final    Culture     Final    Value:        BLOOD CULTURE RECEIVED NO  GROWTH TO DATE CULTURE WILL BE HELD FOR 5 DAYS BEFORE ISSUING A FINAL NEGATIVE REPORT   Report Status PENDING   Incomplete   CULTURE, BLOOD (ROUTINE X 2)     Status: Normal (Preliminary result)   Collection Time   11/22/2012  5:10 PM      Component Value Range Status Comment   Specimen Description BLOOD LEFT FOOT   Final    Special Requests BOTTLES DRAWN AEROBIC ONLY 3CC   Final    Culture  Setup Time 11/16/2012 20:58   Final    Culture     Final    Value:        BLOOD CULTURE RECEIVED NO GROWTH TO DATE CULTURE WILL BE HELD FOR 5 DAYS BEFORE ISSUING A FINAL NEGATIVE REPORT   Report Status PENDING   Incomplete     Medical History: Past Medical History  Diagnosis Date  . Coronary artery disease     s/p coronary bypass grafting 2005  . GERD (gastroesophageal reflux disease)   . Hyperlipidemia   . Hypertension   . BPH (benign prostatic hypertrophy)     elevated PSA Dr. Lindell Noe Bx 2010   Assessment: 78 y/o male known to pharmacy from heparin dosing. Pharmacy consulted to begin vancomycin and Levaquin for PNA and possible UTI (PCN allergy noted). Patient is febrile and WBC are elevated but appear to be trending down. SCr  is normal and UOP is good.  Goal of Therapy:  Vancomycin trough level 15-20 mcg/ml  Plan:  -Vancomycin 750 mg IV q12h -Levaquin 750 mg IV q24h -Monitor renal function closely -Vancomycin trough as clinically indicated   [facility], [address].D., BCPS Clinical Pharmacist Pager: [phone] 10/25/2012 9:50 AM

## 2012-11-04 NOTE — Plan of Care (Signed)
Problem: Phase III Progression Outcomes Goal: Mental status at or near baseline Outcome: Not Met (add Reason) Pt opens eyes.  Does not follow commands or make eye contact

## 2012-11-04 NOTE — Progress Notes (Signed)
PULMONARY  / CRITICAL CARE MEDICINE  Name: WITTEN STENERSON MRN: [id number] DOB: May 09, 1932    LOS: 4  CHIEF COMPLAINT:  Cardiac Arrest  BRIEF PATIENT DESCRIPTION:  78 yo male admitted to [facility] on 10/27/2012 with cardiac arrest, VDRF with subsequent transfer to [facility] for hypothermia protocol.  Significant PMHx of CAD s/p CABG, HTN, Hyperlipidemia, GERD, BPH  LINES / TUBES: ETT 12/7 >>> Rt IJ CVL 12/7>>> Rt radial aline 12/7>>>12/10  CULTURES: MRSA screen 12/07>>POSITIVE Blood 12/08>> Sputum 12/11>>  ANTIBIOTICS: Cipro 12/08>>12/11 Vancomycin 12/11>> Levaquin 12/11>>   EVENTS:  12/7 Out of hospital arrest, estimated 15 minutes down time 12/8- slight brady with cooling, low dose levo needs 12/9 Rewarmed 12/10 Off pressors 12/11 Increased respiratory secretions, low grade temperature  TESTS: 12/07 CT head >> Mild atrophy, chronic small vessel white matter ischemic changes.  Chronic bilateral ethmoid, frontal and sphenoid sinusitis. 2/08 Echo >> EF 40 to 45%, grade 2 diastolic dysfx  LEVEL OF CARE:  ICU PRIMARY SERVICE:  PCCM CONSULTANTS:  [facility] (signed off 12/11) CODE STATUS: Full DIET:  Tube feeds DVT Px:  Heparin SQ GI Px:  protonix   INTERVAL HISTORY:  Tolerating pressure support.    VITAL SIGNS: Temp:  [99 F (37.2 C)-100.9 F (38.3 C)] 99.6 F (37.6 C) (12/11 0326) Pulse Rate:  [104-133] 108  (12/11 0700) Resp:  [13-28] 18  (12/11 0700) BP: (115-179)/(48-80) 128/60 mmHg (12/11 0700) SpO2:  [94 %-99 %] 96 % (12/11 0700) FiO2 (%):  [40 %-50 %] 40 % (12/11 0453) Weight:  [167 lb 5.3 oz (75.9 kg)] 167 lb 5.3 oz (75.9 kg) (12/11 0444) HEMODYNAMICS: CVP:  [6 mmHg-9 mmHg] 8 mmHg VENTILATOR SETTINGS: Vent Mode:  [-] PRVC FiO2 (%):  [40 %-50 %] 40 % Set Rate:  [14 bmp] 14 bmp Vt Set:  [610 mL] 610 mL PEEP:  [5 cmH20] 5 cmH20 Pressure Support:  [5 cmH20] 5 cmH20 Plateau Pressure:  [18 cmH20] 18 cmH20 INTAKE / OUTPUT: Intake/Output      12/10  0701 - 12/11 0700 12/11 0701 - 12/12 0700   I.V. (mL/kg) 985.2 (13)    NG/GT 990    IV Piggyback 354    Total Intake(mL/kg) 2329.2 (30.7)    Urine (mL/kg/hr) 4600 (2.5)    Emesis/NG output 30    Total Output 4630    Net -2300.8           CVP 8  PHYSICAL EXAMINATION: General: No distress Neuro:  Moves all extremities spontaneously, open's eyes spontaneously, not following commands HEENT:  ETT in place Cardiovascular: s1s2 no murmur Lungs:  Scattered rhonchi Abdomen:  Soft, non tender, + bowel sounds Musculoskeletal:  No edema Skin: no rashes   LABS: Cbc  Lab 11/13/2012 0500 11/17/2012 0200 10/29/2012 0345  WBC 12.0* -- --  HGB 8.5* 9.3* 11.0*  HCT 24.3* 26.5* 31.2*  PLT 143* 153 152    Chemistry   Lab 11/13/2012 0500 10/29/2012 0200 11/24/2012 1445 11/15/2012 0755 10/26/2012 1600  NA 131* 129* -- 129* --  K 2.8* 4.0 -- 4.1 --  CL 94* 99 -- 102 --  CO2 30 23 -- 20 --  BUN 23 17 -- 10 --  CREATININE 1.12 1.26 -- 0.94 --  CALCIUM 8.7 7.9* -- 7.8* --  MG -- -- 1.5 -- 1.6  PHOS -- -- -- -- --  GLUCOSE 157* 122* -- 104* --    Liver fxn  Lab 10/29/2012 0200 10/27/2012 0345 11/20/2012 0411  AST 54* 89* 209*  ALT 93* 139* 192*  ALKPHOS 57 61 61  BILITOT 0.7 0.5 0.7  PROT 5.2* 5.2* 5.0*  ALBUMIN 2.2* 2.3* 2.4*   coags  Lab 11/18/2012 0011 11/15/2012 1805  APTT >200* 58*  INR 1.29 1.23   Sepsis markers  Lab 11/19/2012 1725 11/21/2012 1805  LATICACIDVEN 1.7 1.1  PROCALCITON -- --   Cardiac markers  Lab 10/27/2012 0345 11/19/2012 2200 11/13/2012 1600  CKTOTAL -- -- --  CKMB -- -- --  TROPONINI <0.30 <0.30 <0.30   BNP  Lab 11/15/2012 0410  PROBNP 1702.0*   ABG  Lab 10/28/2012 1012 11/13/2012 1747 11/21/2012 1604  PHART 7.424 7.244* 7.185*  PCO2ART 36.6 42.9 52.2*  PO2ART 58.0* 52.0* 65.0*  HCO3 23.9 19.5* 20.8  TCO2 25 21 23     CBG trend  Lab 10/27/2012 0252 11/19/2012 2315 10/30/2012 1944 11/18/2012 1636 10/30/2012 1128  GLUCAP 155* 147* 143* 136* 133*    IMAGING: Dg Chest Port 1  View  10/28/2012  *RADIOLOGY REPORT*  Clinical Data: Shortness of breath.  Follow-up evaluation for congestive heart failure.  PORTABLE CHEST - 1 VIEW  Comparison: Chest x-ray 11/16/2012.  Findings: An endotracheal tube is in place with tip 6.9 cm above the carina. There is a right-sided internal jugular central venous catheter with tip terminating in the mid superior vena cava. A nasogastric tube is seen extending into the stomach, however, the tip of the nasogastric tube extends below the lower margin of the image.  Lung volumes are slightly low.  There are persistent bibasilar opacities which may reflect areas of atelectasis and/or consolidation.  Trace bilateral pleural effusions have decreased. There is cephalization of the pulmonary vasculature, indistinctness of the interstitial markings, and patchy airspace disease throughout the lungs bilaterally suggestive of moderate pulmonary edema.  Mild cardiomegaly is unchanged. The patient is rotated to the right on today's exam, resulting in distortion of the mediastinal contours and reduced diagnostic sensitivity and specificity for mediastinal pathology.  Atherosclerosis in the thoracic aorta. Postoperative changes of median sternotomy for CABG.  IMPRESSION: 1.  Support apparatus, as above. 2.  Findings are again compatible with congestive heart failure; however, overall aeration has slightly improved. 3.  Persistent low lung volumes with bibasilar atelectasis and/or consolidation.   Original Report Authenticated By: Trudie Reed, M.D.    DIAGNOSES: Active Problems:  HYPERLIPIDEMIA  HYPERTENSION  CORONARY ARTERY DISEASE  CORONARY ARTERY BYPASS GRAFT, HX OF  Sudden cardiac death  Acute respiratory failure   ASSESSMENT / PLAN:  PULMONARY  ASSESSMENT:  Acute respiratory failure 2nd to cardiac arrest and acute pulmonary edema.  PLAN:   Pressure support wean as tolerated>>mental status barrier to extubation F/u CXR Adjust PEEP/FiO2 to keep  Spo2 > 92%  CARDIOVASCULAR  ASSESSMENT:  Presumed VF cardiac arrest. Acute on chronic systolic, diastolic CHF. Hx of CAD, HTN, Hyperlipidemia.  PLAN:  D/c heparin gtt Add aspirin 81 mg qd, lopressor 25 mg bid Cardiac cath if mental status improves  RENAL  ASSESSMENT:   Hyponatremia. Likely from volume overload. Hypokalemia.  PLAN:   Continue lasix F/u and replace electrolytes as needed  GASTROINTESTINAL  ASSESSMENT:   Elevated LFT's.  Hepatitis C Ab reactive from 11/20/2012. Likely from cardiac arrest/shock.  Improving. Nutrition.  PLAN:   F/u LFT's intermittently Check HCV PCR Continue tube feeds while on vent  HEMATOLOGIC  ASSESSMENT:   Mild anemia. Mild thrombocytopenia.  PLAN:  F/u CBC  INFECTIOUS  ASSESSMENT:  ?UTI. Increased respiratory secretions, low grade temperature, persistent pulmonary infiltrates 12/11. Hx  of PCN allergy. PLAN:   D/c cipro Add vancomycin, levaquin 12/11 Check sputum culture  ENDOCRINE  ASSESSMENT:   Hyperglycemia 2nd to acute stress. No hx of DM.  PLAN:   SSI  NEUROLOGIC  ASSESSMENT:   Acute encephalopathy 2nd to cardiac arrest. Has spontaneous movements 12/10>>not following commands.  PLAN:   Monitor mental status Intermittent sedation protocol while on vent If no further improvement in mental status by 12/12, then consider brain imaging/EEG/neuro evaluation  Updated family.  Critical care time 35 minutes.  Coralyn Helling, MD [facility] Pulmonary/Critical Care 10/29/2012, 8:07 AM Pager:  [phone] After 3pm call: [phone]

## 2012-11-04 NOTE — Progress Notes (Signed)
PROGRESS NOTE  Subjective:   Pt was admitted with cardiac arrest yesterday.  Hx of CAD, CABG. Witnessed arrest at home while eating. Presumed VF arrest.  EMS was called.  CPR was started 10 minutes after the witnessed arrest by EMS.  Transferred to [facility] ,  artic sun protocol.  He has been rewarmed.  He is moving spontaneously but not on command.  Still on vent.  Objective:    Vital Signs:   Temp:  [99 F (37.2 C)-100.9 F (38.3 C)] 99.6 F (37.6 C) (12/11 0326) Pulse Rate:  [104-133] 108  (12/11 0700) Resp:  [13-28] 18  (12/11 0700) BP: (115-179)/(48-80) 128/60 mmHg (12/11 0700) SpO2:  [94 %-99 %] 96 % (12/11 0700) FiO2 (%):  [40 %-50 %] 40 % (12/11 0453) Weight:  [167 lb 5.3 oz (75.9 kg)] 167 lb 5.3 oz (75.9 kg) (12/11 0444)  Last BM Date:  (PTA)   24-hour weight change: Weight change: -1 lb 15.7 oz (-0.9 kg)  Weight trends: Filed Weights   10/29/2012 0500 11/13/2012 0400 10/26/2012 0444  Weight: 174 lb 6.1 oz (79.1 kg) 169 lb 5 oz (76.8 kg) 167 lb 5.3 oz (75.9 kg)    Intake/Output:  12/10 0701 - 12/11 0700 In: 2329.2 [I.V.:985.2; NG/GT:990; IV Piggyback:354] Out: 4630 [Urine:4600; Emesis/NG output:30]     Physical Exam: BP 128/60  Pulse 108  Temp 99.6 F (37.6 C) (Oral)  Resp 18  Ht 5\' 11"  (1.803 m)  Wt 167 lb 5.3 oz (75.9 kg)  BMI 23.34 kg/m2  SpO2 96%  General: Vital signs reviewed and noted.   Head: Normocephalic, atraumatic., intubated  Eyes: conjunctivae/corneas clear.    Throat: normal  Neck:  left IJ in place  Lungs:  clear  Heart: RR  Abdomen:  Soft, non-tender, non-distended    Extremities: No edema   Neurologic: , on vent, no cooperative       Labs: BMET:  Basename 11/14/2012 0500 11/15/2012 0200 11/22/2012 1445 11/16/2012 1600  NA 131* 129* -- --  K 2.8* 4.0 -- --  CL 94* 99 -- --  CO2 30 23 -- --  GLUCOSE 157* 122* -- --  BUN 23 17 -- --  CREATININE 1.12 1.26 -- --  CALCIUM 8.7 7.9* -- --  MG -- -- 1.5 1.6  PHOS -- -- -- --    Liver  function tests:  Basename 11/14/2012 0200 11/16/2012 0345  AST 54* 89*  ALT 93* 139*  ALKPHOS 57 61  BILITOT 0.7 0.5  PROT 5.2* 5.2*  ALBUMIN 2.2* 2.3*   No results found for this basename: LIPASE:2,AMYLASE:2 in the last 72 hours  CBC:  Basename 10/27/2012 0500 11/20/2012 0200 10/25/2012 0345  WBC 12.0* 15.3* --  NEUTROABS -- -- 10.8*  HGB 8.5* 9.3* --  HCT 24.3* 26.5* --  MCV 85.3 86.6 --  PLT 143* 153 --    Cardiac Enzymes:  Basename 11/20/2012 0345 11/24/2012 2200 11/24/2012 1600  CKTOTAL -- -- --  CKMB -- -- --  TROPONINI <0.30 <0.30 <0.30    Coagulation Studies: No results found for this basename: LABPROT:5,INR:5 in the last 72 hours  Other:  Other results: Tele:  NSR  Medications:    Infusions:    . sodium chloride 20 mL/hr at 10/29/2012 2000  . sodium chloride Stopped (11/21/2012 2000)  . sodium chloride 20 mL/hr at 11/22/2012 0700  . heparin 1,900 Units/hr (11/19/2012 0700)  . [DISCONTINUED] norepinephrine (LEVOPHED) Adult infusion Stopped (10/29/2012 0610)    Scheduled Medications:    .  antiseptic oral rinse  15 mL Mouth Rinse QID  . chlorhexidine  15 mL Mouth Rinse BID  . Chlorhexidine Gluconate Cloth  6 each Topical Q0600  . ciprofloxacin  400 mg Intravenous Q12H  . feeding supplement (OSMOLITE 1.5 CAL)  1,000 mL Per Tube Q24H  . feeding supplement  30 mL Per Tube QID  . [COMPLETED] furosemide      . [EXPIRED] furosemide  40 mg Intravenous Q8H  . [COMPLETED] heparin  3,000 Units Intravenous Once  . [COMPLETED] heparin  5,000 Units Intravenous Once  . insulin aspart  0-9 Units Subcutaneous Q4H  . mupirocin ointment  1 application Nasal BID  . pantoprazole sodium  40 mg Per Tube Q24H    Assessment/ Plan:    1. Cardiac arrest.  S/p Arctic sun protocol.  Cardiac enzymes are negative.  At this point, it is doubtful that he will make a meaningful recovery.   Will not plan on cath at this point.    2. Hypokalemia:  Will give KCl 40 meq.  PCCM to follow up.  3.  Chronic systolic CHF:  By echo.   4. Neuro:  No purposeful movement or response at this point.  Plans per PCCM / neuro team.  Will sign off.  Call for questions.   Disposition:  Length of Stay: 4  Vesta Mixer, Montez Hageman., MD, [facility] 10/26/2012, 7:23 AM Office [phone] Pager [phone]

## 2012-11-04 NOTE — Progress Notes (Signed)
ANTICOAGULATION CONSULT NOTE - Follow Up Consult  Pharmacy Consult for heparin Indication: s/p cardiac arrest  Labs:  Basename 10/29/2012 0500 November 30, 2012 2009 11-30-12 1300 11-30-12 0200 11/11/2012 0755 10/26/2012 0345 10/31/2012 2200 10/26/2012 1600  HGB 8.5* -- -- 9.3* -- -- -- --  HCT 24.3* -- -- 26.5* -- 31.2* -- --  PLT 143* -- -- 153 -- 152 -- --  APTT -- -- -- -- -- -- -- --  LABPROT -- -- -- -- -- -- -- --  INR -- -- -- -- -- -- -- --  HEPARINUNFRC 0.31 0.12* 0.13* -- -- -- -- --  CREATININE -- -- -- 1.26 0.94 0.86 -- --  CKTOTAL -- -- -- -- -- -- -- --  CKMB -- -- -- -- -- -- -- --  TROPONINI -- -- -- -- -- <0.30 <0.30 <0.30    Assessment: 78yo now therapeutic on heparin after multiple rate increases after rewarmed, though at low end of normal.  Goal of Therapy:  Heparin level 0.3-0.7 units/ml   Plan:  Will increase heparin gtt slightly to 1900 units/hr and check level in 8hr to confirm.  Colleen Can PharmD BCPS 11/17/2012,6:08 AM

## 2012-11-04 NOTE — Progress Notes (Signed)
Nutrition Follow-up  Intervention:   1. Continue current TF regimen, currently meeting 92% kcal needs and 100% protein needs.  2. RD will continue to follow    Assessment:   Remains intubated, moves spontaneously but not to command. Off pressors.  + Hep C ab from 12/8. No edema.  Possible cath if recovers from a neuro standpoint.   Remains on TF: Patient has OG in place with tip of tube stomach. Osmolite 1.5  is infusing @ 40 ml/hr. 30 ml Prostat via tube 4 times daily. Tube feeding regimen currently providing 1840 kcal, 120 grams protein, and 732 ml H2O.   Free water flushes: none  Total free water: 732 ml per day from TF. Additional free water from IV fluids.   Residuals: 150 ml on 12/11  Last bm: no documented   Diet Order:  NPO  Meds: Scheduled Meds:   . antiseptic oral rinse  15 mL Mouth Rinse QID  . aspirin  81 mg Oral Daily  . chlorhexidine  15 mL Mouth Rinse BID  . Chlorhexidine Gluconate Cloth  6 each Topical Q0600  . feeding supplement (OSMOLITE 1.5 CAL)  1,000 mL Per Tube Q24H  . feeding supplement  30 mL Per Tube QID  . furosemide  40 mg Per Tube Once  . [EXPIRED] furosemide  40 mg Intravenous Q8H  . [COMPLETED] heparin  3,000 Units Intravenous Once  . [COMPLETED] heparin  5,000 Units Intravenous Once  . heparin subcutaneous  5,000 Units Subcutaneous Q8H  . insulin aspart  0-9 Units Subcutaneous Q4H  . levofloxacin (LEVAQUIN) IV  750 mg Intravenous Q24H  . metoprolol tartrate  25 mg Per Tube BID  . mupirocin ointment  1 application Nasal BID  . pantoprazole sodium  40 mg Per Tube Q24H  . [COMPLETED] potassium chloride  40 mEq Per Tube Once  . potassium chloride  40 mEq Per Tube Q4H  . potassium chloride      . potassium chloride      . vancomycin  750 mg Intravenous Q12H  . [DISCONTINUED] ciprofloxacin  400 mg Intravenous Q12H   Continuous Infusions:   . sodium chloride 20 mL/hr at 10/28/2012 2000  . sodium chloride Stopped (11/19/2012 2000)  . sodium  chloride 20 mL/hr at 10/27/2012 0700  . [DISCONTINUED] heparin 1,900 Units/hr (11/24/2012 0700)   PRN Meds:.acetaminophen, fentaNYL, midazolam   CMP     Component Value Date/Time   NA 131* 10/30/2012 0500   K 2.8* 10/28/2012 0500   CL 94* 11/18/2012 0500   CO2 30 11/13/2012 0500   GLUCOSE 157* 10/26/2012 0500   BUN 23 11/24/2012 0500   CREATININE 1.12 10/29/2012 0500   CALCIUM 8.7 11/17/2012 0500   PROT 5.2* 11/21/2012 0200   ALBUMIN 2.2* 11/14/2012 0200   AST 54* 10/29/2012 0200   ALT 93* 11/19/2012 0200   ALKPHOS 57 10/30/2012 0200   BILITOT 0.7 10/28/2012 0200   GFRNONAA 61* 11/20/2012 0500   GFRAA 71* 11/20/2012 0500    CBG (last 3)   Basename 11/13/2012 0755 11/17/2012 0252 11/19/2012 2315  GLUCAP 141* 155* 147*     Intake/Output Summary (Last 24 hours) at 10/26/2012 1017 Last data filed at 11/20/2012 0900  Gross per 24 hour  Intake 2102.17 ml  Output   4330 ml  Net -2227.83 ml   Patient is currently intubated on ventilator support.  MV: ~12 Temp:Temp (24hrs), Avg:100.2 F (37.9 C), Min:99.6 F (37.6 C), Max:100.9 F (38.3 C)  Propofol: none    Weight  Status:  167 lbs, trending down with fluids off   Re-estimated needs:  2000 kcal, 120-135 gm protein   Nutrition Dx:  Inadequate oral intake related to inability to eat as evidenced by mechanical ventilation. Ongoing    Goal:  Meet>/=90% estimated nutrition need with EN while intubated.   Monitor:  Vent status, weight trends, labs, I/O's   Clarene Duke RD, LDN Pager [phone] After Hours pager [phone]

## 2012-11-05 ENCOUNTER — Inpatient Hospital Stay (HOSPITAL_COMMUNITY): Payer: Medicare Other

## 2012-11-05 DIAGNOSIS — J189 Pneumonia, unspecified organism: Secondary | ICD-10-CM

## 2012-11-05 DIAGNOSIS — J9600 Acute respiratory failure, unspecified whether with hypoxia or hypercapnia: Secondary | ICD-10-CM

## 2012-11-05 DIAGNOSIS — G931 Anoxic brain damage, not elsewhere classified: Secondary | ICD-10-CM | POA: Diagnosis present

## 2012-11-05 DIAGNOSIS — I469 Cardiac arrest, cause unspecified: Secondary | ICD-10-CM

## 2012-11-05 LAB — GLUCOSE, CAPILLARY
Glucose-Capillary: 104 mg/dL — ABNORMAL HIGH (ref 70–99)
Glucose-Capillary: 112 mg/dL — ABNORMAL HIGH (ref 70–99)
Glucose-Capillary: 120 mg/dL — ABNORMAL HIGH (ref 70–99)
Glucose-Capillary: 134 mg/dL — ABNORMAL HIGH (ref 70–99)
Glucose-Capillary: 147 mg/dL — ABNORMAL HIGH (ref 70–99)
Glucose-Capillary: 148 mg/dL — ABNORMAL HIGH (ref 70–99)
Glucose-Capillary: 153 mg/dL — ABNORMAL HIGH (ref 70–99)

## 2012-11-05 LAB — BASIC METABOLIC PANEL
BUN: 30 mg/dL — ABNORMAL HIGH (ref 6–23)
CO2: 29 mEq/L (ref 19–32)
Calcium: 9.4 mg/dL (ref 8.4–10.5)
Chloride: 102 mEq/L (ref 96–112)
Creatinine, Ser: 1.04 mg/dL (ref 0.50–1.35)
GFR calc Af Amer: 77 mL/min — ABNORMAL LOW (ref >90)
GFR calc non Af Amer: 67 mL/min — ABNORMAL LOW (ref >90)
Glucose, Bld: 148 mg/dL — ABNORMAL HIGH (ref 70–99)
Potassium: 3.8 mEq/L (ref 3.5–5.1)
Sodium: 137 mEq/L (ref 135–145)

## 2012-11-05 LAB — CBC
HCT: 24.7 % — ABNORMAL LOW (ref 39.0–52.0)
Hemoglobin: 8.5 g/dL — ABNORMAL LOW (ref 13.0–17.0)
MCH: 29.9 pg (ref 26.0–34.0)
MCHC: 34.4 g/dL (ref 30.0–36.0)
MCV: 87 fL (ref 78.0–100.0)
Platelets: 162 10*3/uL (ref 150–400)
RBC: 2.84 MIL/uL — ABNORMAL LOW (ref 4.22–5.81)
RDW: 13.3 % (ref 11.5–15.5)
WBC: 12.4 10*3/uL — ABNORMAL HIGH (ref 4.0–10.5)

## 2012-11-05 MED ORDER — FENTANYL CITRATE 0.05 MG/ML IJ SOLN
25.0000 ug | INTRAMUSCULAR | Status: DC | PRN
  Administered 2012-11-05: 50 ug via INTRAVENOUS
  Filled 2012-11-05: qty 2

## 2012-11-05 MED ORDER — FUROSEMIDE 8 MG/ML PO SOLN
40.0000 mg | Freq: Every day | ORAL | Status: DC
  Administered 2012-11-05 – 2012-11-06 (×2): 40 mg
  Filled 2012-11-05 (×3): qty 5

## 2012-11-05 MED ORDER — GADOBENATE DIMEGLUMINE 529 MG/ML IV SOLN
14.0000 mL | Freq: Once | INTRAVENOUS | Status: AC
  Administered 2012-11-05: 14 mL via INTRAVENOUS

## 2012-11-05 MED ORDER — MIDAZOLAM HCL 2 MG/2ML IJ SOLN
2.0000 mg | INTRAMUSCULAR | Status: DC | PRN
  Administered 2012-11-05: 2 mg via INTRAVENOUS
  Filled 2012-11-05: qty 4

## 2012-11-05 MED ORDER — FENTANYL CITRATE 0.05 MG/ML IJ SOLN
25.0000 ug | INTRAMUSCULAR | Status: DC | PRN
  Administered 2012-11-05: 50 ug via INTRAVENOUS
  Administered 2012-11-05: 100 ug via INTRAVENOUS
  Administered 2012-11-07: 25 ug via INTRAVENOUS
  Administered 2012-11-08: 50 ug via INTRAVENOUS
  Filled 2012-11-05 (×4): qty 2

## 2012-11-05 MED ORDER — MIDAZOLAM HCL 2 MG/2ML IJ SOLN
1.0000 mg | INTRAMUSCULAR | Status: DC | PRN
  Administered 2012-11-05: 2 mg via INTRAVENOUS
  Filled 2012-11-05: qty 2

## 2012-11-05 MED ORDER — DEXMEDETOMIDINE HCL IN NACL 200 MCG/50ML IV SOLN
0.2000 ug/kg/h | INTRAVENOUS | Status: DC
  Administered 2012-11-05 (×3): 0.5 ug/kg/h via INTRAVENOUS
  Administered 2012-11-05: 0.2 ug/kg/h via INTRAVENOUS
  Administered 2012-11-05: 0.5 ug/kg/h via INTRAVENOUS
  Administered 2012-11-05: 0.4 ug/kg/h via INTRAVENOUS
  Administered 2012-11-05: 0.3 ug/kg/h via INTRAVENOUS
  Administered 2012-11-06: 0.5 ug/kg/h via INTRAVENOUS
  Filled 2012-11-05 (×4): qty 50
  Filled 2012-11-05: qty 100

## 2012-11-05 MED ORDER — METOPROLOL TARTRATE 25 MG/10 ML ORAL SUSPENSION
50.0000 mg | Freq: Two times a day (BID) | ORAL | Status: DC
  Administered 2012-11-05 – 2012-11-06 (×3): 50 mg
  Filled 2012-11-05 (×4): qty 20

## 2012-11-05 NOTE — Progress Notes (Signed)
PULMONARY  / CRITICAL CARE MEDICINE  Name: [name] Christensen MRN: [id number] DOB: 03/23/1932    LOS: 5  CHIEF COMPLAINT:  Cardiac Arrest  BRIEF PATIENT DESCRIPTION:  78 yo male admitted to [facility] on 10/28/2012 with cardiac arrest, VDRF with subsequent transfer to [facility] for hypothermia protocol.  Significant PMHx of CAD s/p CABG, HTN, Hyperlipidemia, GERD, BPH  LINES / TUBES: ETT 12/7 >>> Rt IJ CVL 12/7>>> Rt radial aline 12/7>>>12/10  CULTURES: MRSA screen 12/07>>POSITIVE Blood 12/08>> Sputum 12/11>>  ANTIBIOTICS: Cipro 12/08>>12/11 Vancomycin 12/11>> Levaquin 12/11>>   EVENTS:  12/7 Out of hospital arrest, estimated 15 minutes down time 12/8- slight brady with cooling, low dose levo needs 12/9 Rewarmed 12/10 Off pressors 12/11 Increased respiratory secretions, low grade temperature  TESTS: 12/07 CT head >> Mild atrophy, chronic small vessel white matter ischemic changes.  Chronic bilateral ethmoid, frontal and sphenoid sinusitis. 12/08 Echo >> EF 40 to 45%, grade 2 diastolic dysfx  LEVEL OF CARE:  ICU PRIMARY SERVICE:  PCCM CONSULTANTS:  [facility] (signed off 12/11) CODE STATUS: Full DIET:  Tube feeds DVT Px:  Heparin SQ GI Px:  protonix   INTERVAL HISTORY:  Remains agitated.  Frequent doses of versed/fentanyl overnight.  Tm 101.3.  Still has increased respiratory secretions.  VITAL SIGNS: Temp:  [99.4 F (37.4 C)-101.3 F (38.5 C)] 100.5 F (38.1 C) (12/12 0300) Pulse Rate:  [35-115] 103  (12/12 0700) Resp:  [4-27] 19  (12/12 0700) BP: (117-177)/(47-89) 129/48 mmHg (12/12 0600) SpO2:  [92 %-99 %] 95 % (12/12 0700) FiO2 (%):  [40 %] 40 % (12/12 0500) Weight:  [161 lb 2.5 oz (73.1 kg)] 161 lb 2.5 oz (73.1 kg) (12/12 0500) HEMODYNAMICS: CVP:  [9 mmHg-13 mmHg] 9 mmHg VENTILATOR SETTINGS: Vent Mode:  [-] PRVC FiO2 (%):  [40 %] 40 % Set Rate:  [14 bmp] 14 bmp Vt Set:  [610 mL] 610 mL PEEP:  [5 cmH20] 5 cmH20 Pressure Support:  [8 cmH20] 8  cmH20 Plateau Pressure:  [18 cmH20-20 cmH20] 19 cmH20 INTAKE / OUTPUT: Intake/Output      12/11 0701 - 12/12 0700 12/12 0701 - 12/13 0700   I.V. (mL/kg) 410.5 (5.6)    NG/GT 550    IV Piggyback 450    Total Intake(mL/kg) 1410.5 (19.3)    Urine (mL/kg/hr) 2200 (1.3)    Emesis/NG output     Total Output 2200    Net -789.5            PHYSICAL EXAMINATION: General: No distress Neuro:  Moves all extremities spontaneously, open's eyes spontaneously, not following commands HEENT:  ETT in place Cardiovascular: s1s2 no murmur Lungs:  Scattered rhonchi Abdomen:  Soft, non tender, + bowel sounds Musculoskeletal:  No edema Skin: no rashes   LABS: Cbc  Lab 11/22/2012 0500 11/18/2012 0500 11/19/2012 0200  WBC 12.4* -- --  HGB 8.5* 8.5* 9.3*  HCT 24.7* 24.3* 26.5*  PLT 162 143* 153    Chemistry   Lab 10/29/2012 0500 11/16/2012 0500 10/28/2012 0200 11/20/2012 1445 11/19/2012 1600  NA 137 131* 129* -- --  K 3.8 2.8* 4.0 -- --  CL 102 94* 99 -- --  CO2 29 30 23  -- --  BUN 30* 23 17 -- --  CREATININE 1.04 1.12 1.26 -- --  CALCIUM 9.4 8.7 7.9* -- --  MG -- -- -- 1.5 1.6  PHOS -- -- -- -- --  GLUCOSE 148* 157* 122* -- --    Liver fxn  Lab 11/18/2012 0200  PULMONARY  / CRITICAL CARE MEDICINE  Name: [name] Christensen MRN: [id number] DOB: 03/23/1932    LOS: 5  CHIEF COMPLAINT:  Cardiac Arrest  BRIEF PATIENT DESCRIPTION:  78 yo male admitted to [facility] on 11/15/2012 with cardiac arrest, VDRF with subsequent transfer to [facility] for hypothermia protocol.  Significant PMHx of CAD s/p CABG, HTN, Hyperlipidemia, GERD, BPH  LINES / TUBES: ETT 12/7 >>> Rt IJ CVL 12/7>>> Rt radial aline 12/7>>>12/10  CULTURES: MRSA screen 12/07>>POSITIVE Blood 12/08>> Sputum 12/11>>  ANTIBIOTICS: Cipro 12/08>>12/11 Vancomycin 12/11>> Levaquin 12/11>>   EVENTS:  12/7 Out of hospital arrest, estimated 15 minutes down time 12/8- slight brady with cooling, low dose levo needs 12/9 Rewarmed 12/10 Off pressors 12/11 Increased respiratory secretions, low grade temperature  TESTS: 12/07 CT head >> Mild atrophy, chronic small vessel white matter ischemic changes.  Chronic bilateral ethmoid, frontal and sphenoid sinusitis. 12/08 Echo >> EF 40 to 45%, grade 2 diastolic dysfx  LEVEL OF CARE:  ICU PRIMARY SERVICE:  PCCM CONSULTANTS:  [facility] (signed off 12/11) CODE STATUS: Full DIET:  Tube feeds DVT Px:  Heparin SQ GI Px:  protonix   INTERVAL HISTORY:  Remains agitated.  Frequent doses of versed/fentanyl overnight.  Tm 101.3.  Still has increased respiratory secretions.  VITAL SIGNS: Temp:  [99.4 F (37.4 C)-101.3 F (38.5 C)] 100.5 F (38.1 C) (12/12 0300) Pulse Rate:  [35-115] 103  (12/12 0700) Resp:  [4-27] 19  (12/12 0700) BP: (117-177)/(47-89) 129/48 mmHg (12/12 0600) SpO2:  [92 %-99 %] 95 % (12/12 0700) FiO2 (%):  [40 %] 40 % (12/12 0500) Weight:  [161 lb 2.5 oz (73.1 kg)] 161 lb 2.5 oz (73.1 kg) (12/12 0500) HEMODYNAMICS: CVP:  [9 mmHg-13 mmHg] 9 mmHg VENTILATOR SETTINGS: Vent Mode:  [-] PRVC FiO2 (%):  [40 %] 40 % Set Rate:  [14 bmp] 14 bmp Vt Set:  [610 mL] 610 mL PEEP:  [5 cmH20] 5 cmH20 Pressure Support:  [8 cmH20] 8  cmH20 Plateau Pressure:  [18 cmH20-20 cmH20] 19 cmH20 INTAKE / OUTPUT: Intake/Output      12/11 0701 - 12/12 0700 12/12 0701 - 12/13 0700   I.V. (mL/kg) 410.5 (5.6)    NG/GT 550    IV Piggyback 450    Total Intake(mL/kg) 1410.5 (19.3)    Urine (mL/kg/hr) 2200 (1.3)    Emesis/NG output     Total Output 2200    Net -789.5            PHYSICAL EXAMINATION: General: No distress Neuro:  Moves all extremities spontaneously, open's eyes spontaneously, not following commands HEENT:  ETT in place Cardiovascular: s1s2 no murmur Lungs:  Scattered rhonchi Abdomen:  Soft, non tender, + bowel sounds Musculoskeletal:  No edema Skin: no rashes   LABS: Cbc  Lab 11/22/2012 0500 11/18/2012 0500 11/19/2012 0200  WBC 12.4* -- --  HGB 8.5* 8.5* 9.3*  HCT 24.7* 24.3* 26.5*  PLT 162 143* 153    Chemistry   Lab 10/29/2012 0500 11/16/2012 0500 10/30/2012 0200 11/20/2012 1445 11/19/2012 1600  NA 137 131* 129* -- --  K 3.8 2.8* 4.0 -- --  CL 102 94* 99 -- --  CO2 29 30 23  -- --  BUN 30* 23 17 -- --  CREATININE 1.04 1.12 1.26 -- --  CALCIUM 9.4 8.7 7.9* -- --  MG -- -- -- 1.5 1.6  PHOS -- -- -- -- --  GLUCOSE 148* 157* 122* -- --    Liver fxn  Lab 11/18/2012 0200  PULMONARY  / CRITICAL CARE MEDICINE  Name: [name] Christensen MRN: [id number] DOB: 03/23/1932    LOS: 5  CHIEF COMPLAINT:  Cardiac Arrest  BRIEF PATIENT DESCRIPTION:  78 yo male admitted to [facility] on 11/15/2012 with cardiac arrest, VDRF with subsequent transfer to [facility] for hypothermia protocol.  Significant PMHx of CAD s/p CABG, HTN, Hyperlipidemia, GERD, BPH  LINES / TUBES: ETT 12/7 >>> Rt IJ CVL 12/7>>> Rt radial aline 12/7>>>12/10  CULTURES: MRSA screen 12/07>>POSITIVE Blood 12/08>> Sputum 12/11>>  ANTIBIOTICS: Cipro 12/08>>12/11 Vancomycin 12/11>> Levaquin 12/11>>   EVENTS:  12/7 Out of hospital arrest, estimated 15 minutes down time 12/8- slight brady with cooling, low dose levo needs 12/9 Rewarmed 12/10 Off pressors 12/11 Increased respiratory secretions, low grade temperature  TESTS: 12/07 CT head >> Mild atrophy, chronic small vessel white matter ischemic changes.  Chronic bilateral ethmoid, frontal and sphenoid sinusitis. 12/08 Echo >> EF 40 to 45%, grade 2 diastolic dysfx  LEVEL OF CARE:  ICU PRIMARY SERVICE:  PCCM CONSULTANTS:  [facility] (signed off 12/11) CODE STATUS: Full DIET:  Tube feeds DVT Px:  Heparin SQ GI Px:  protonix   INTERVAL HISTORY:  Remains agitated.  Frequent doses of versed/fentanyl overnight.  Tm 101.3.  Still has increased respiratory secretions.  VITAL SIGNS: Temp:  [99.4 F (37.4 C)-101.3 F (38.5 C)] 100.5 F (38.1 C) (12/12 0300) Pulse Rate:  [35-115] 103  (12/12 0700) Resp:  [4-27] 19  (12/12 0700) BP: (117-177)/(47-89) 129/48 mmHg (12/12 0600) SpO2:  [92 %-99 %] 95 % (12/12 0700) FiO2 (%):  [40 %] 40 % (12/12 0500) Weight:  [161 lb 2.5 oz (73.1 kg)] 161 lb 2.5 oz (73.1 kg) (12/12 0500) HEMODYNAMICS: CVP:  [9 mmHg-13 mmHg] 9 mmHg VENTILATOR SETTINGS: Vent Mode:  [-] PRVC FiO2 (%):  [40 %] 40 % Set Rate:  [14 bmp] 14 bmp Vt Set:  [610 mL] 610 mL PEEP:  [5 cmH20] 5 cmH20 Pressure Support:  [8 cmH20] 8  cmH20 Plateau Pressure:  [18 cmH20-20 cmH20] 19 cmH20 INTAKE / OUTPUT: Intake/Output      12/11 0701 - 12/12 0700 12/12 0701 - 12/13 0700   I.V. (mL/kg) 410.5 (5.6)    NG/GT 550    IV Piggyback 450    Total Intake(mL/kg) 1410.5 (19.3)    Urine (mL/kg/hr) 2200 (1.3)    Emesis/NG output     Total Output 2200    Net -789.5            PHYSICAL EXAMINATION: General: No distress Neuro:  Moves all extremities spontaneously, open's eyes spontaneously, not following commands HEENT:  ETT in place Cardiovascular: s1s2 no murmur Lungs:  Scattered rhonchi Abdomen:  Soft, non tender, + bowel sounds Musculoskeletal:  No edema Skin: no rashes   LABS: Cbc  Lab 11/22/2012 0500 11/18/2012 0500 11/19/2012 0200  WBC 12.4* -- --  HGB 8.5* 8.5* 9.3*  HCT 24.7* 24.3* 26.5*  PLT 162 143* 153    Chemistry   Lab 10/29/2012 0500 11/16/2012 0500 10/30/2012 0200 11/20/2012 1445 11/19/2012 1600  NA 137 131* 129* -- --  K 3.8 2.8* 4.0 -- --  CL 102 94* 99 -- --  CO2 29 30 23  -- --  BUN 30* 23 17 -- --  CREATININE 1.04 1.12 1.26 -- --  CALCIUM 9.4 8.7 7.9* -- --  MG -- -- -- 1.5 1.6  PHOS -- -- -- -- --  GLUCOSE 148* 157* 122* -- --    Liver fxn  Lab 11/18/2012 0200

## 2012-11-05 NOTE — Progress Notes (Signed)
EEG completed.

## 2012-11-06 ENCOUNTER — Inpatient Hospital Stay (HOSPITAL_COMMUNITY): Payer: Medicare Other

## 2012-11-06 DIAGNOSIS — J189 Pneumonia, unspecified organism: Secondary | ICD-10-CM

## 2012-11-06 DIAGNOSIS — I10 Essential (primary) hypertension: Secondary | ICD-10-CM

## 2012-11-06 HISTORY — DX: Pneumonia, unspecified organism: J18.9

## 2012-11-06 LAB — BASIC METABOLIC PANEL
BUN: 36 mg/dL — ABNORMAL HIGH (ref 6–23)
CO2: 28 mEq/L (ref 19–32)
Calcium: 9.9 mg/dL (ref 8.4–10.5)
Chloride: 106 mEq/L (ref 96–112)
Creatinine, Ser: 0.93 mg/dL (ref 0.50–1.35)
GFR calc Af Amer: >90 mL/min (ref >90)
GFR calc non Af Amer: 78 mL/min — ABNORMAL LOW (ref >90)
Glucose, Bld: 152 mg/dL — ABNORMAL HIGH (ref 70–99)
Potassium: 3.5 mEq/L (ref 3.5–5.1)
Sodium: 142 mEq/L (ref 135–145)

## 2012-11-06 LAB — CULTURE, RESPIRATORY

## 2012-11-06 LAB — GLUCOSE, CAPILLARY
Glucose-Capillary: 122 mg/dL — ABNORMAL HIGH (ref 70–99)
Glucose-Capillary: 131 mg/dL — ABNORMAL HIGH (ref 70–99)
Glucose-Capillary: 139 mg/dL — ABNORMAL HIGH (ref 70–99)
Glucose-Capillary: 101 mg/dL — ABNORMAL HIGH (ref 70–99)
Glucose-Capillary: 103 mg/dL — ABNORMAL HIGH (ref 70–99)
Glucose-Capillary: 96 mg/dL (ref 70–99)

## 2012-11-06 LAB — CULTURE, RESPIRATORY W GRAM STAIN: Special Requests: NORMAL

## 2012-11-06 LAB — VANCOMYCIN, TROUGH: Vancomycin Tr: 8.3 ug/mL — ABNORMAL LOW (ref 10.0–20.0)

## 2012-11-06 MED ORDER — POTASSIUM CHLORIDE 10 MEQ/50ML IV SOLN
10.0000 meq | INTRAVENOUS | Status: AC
  Administered 2012-11-06 (×4): 10 meq via INTRAVENOUS
  Filled 2012-11-06: qty 200

## 2012-11-06 MED ORDER — WHITE PETROLATUM GEL
Status: AC
  Administered 2012-11-06: 0.2
  Filled 2012-11-06: qty 5

## 2012-11-06 MED ORDER — PANTOPRAZOLE SODIUM 40 MG IV SOLR
40.0000 mg | Freq: Every day | INTRAVENOUS | Status: DC
  Administered 2012-11-06 – 2012-11-11 (×6): 40 mg via INTRAVENOUS
  Filled 2012-11-06 (×8): qty 40

## 2012-11-06 MED ORDER — METOPROLOL TARTRATE 1 MG/ML IV SOLN
INTRAVENOUS | Status: AC
  Filled 2012-11-06: qty 5

## 2012-11-06 MED ORDER — METOPROLOL TARTRATE 1 MG/ML IV SOLN
5.0000 mg | Freq: Two times a day (BID) | INTRAVENOUS | Status: DC
  Administered 2012-11-06 – 2012-11-07 (×2): 5 mg via INTRAVENOUS
  Filled 2012-11-06 (×2): qty 5

## 2012-11-06 MED ORDER — VANCOMYCIN HCL 1000 MG IV SOLR
750.0000 mg | Freq: Three times a day (TID) | INTRAVENOUS | Status: DC
  Administered 2012-11-06 – 2012-11-08 (×6): 750 mg via INTRAVENOUS
  Filled 2012-11-06 (×8): qty 750

## 2012-11-06 NOTE — Procedures (Signed)
Extubation Procedure Note  Patient Details:   Name: [name] Christensen DOB: [birth year] MRN: [id number]   Airway Documentation:  Airway 8 mm (Active)  Secured at (cm) 22 cm 10/29/2012  8:32 AM  Measured From Lips 11/20/2012  8:32 AM  Secured Location Right 10/26/2012  8:32 AM  Secured By [employer] 10/29/2012  8:32 AM  Tube Holder Repositioned Yes 11/18/2012  8:32 AM  Cuff Pressure (cm H2O) 25 cm H2O 11/14/2012 12:00 AM  Site Condition Dry 10/26/2012  8:32 AM   Pt extubated to 5lpm [place], sats 92% no distress noted, suctioned and hyperoxygenated prior to procedure, unable to do incentive, weak productive cough with deep breathing exercises.  Evaluation  O2 sats: stable throughout Complications: No apparent complications Patient did tolerate procedure well. Bilateral Breath Sounds: Diminished Suctioning: Airway Yes  Renae Fickle 10/29/2012, 10:19 AM

## 2012-11-06 NOTE — Progress Notes (Signed)
ANTIBIOTIC CONSULT NOTE - FOLLOW UP  Pharmacy Consult for Vancomycin/Levaquin Indication: pneumonia/possible UTI  Allergies  Allergen Reactions  . Amoxicillin     REACTION: unspecified  . Ezetimibe     REACTION: numbness  . Niacin     REACTION: CP  . Penicillins   . Pravastatin Sodium   . Rosuvastatin     REACTION: aches    Patient Measurements: Height: 5\' 11"  (180.3 cm) Weight: 157 lb 13.6 oz (71.6 kg) IBW/kg (Calculated) : 75.3   Vital Signs: Temp: 98.8 F (37.1 C) (12/13 0800) Temp src: Oral (12/13 0800) BP: 166/70 mmHg (12/13 0900) Pulse Rate: 76  (12/13 0900) Intake/Output from previous day: 12/12 0701 - 12/13 0700 In: 2104.8 [I.V.:634.8; NG/GT:1020; IV Piggyback:450] Out: 2105 [Urine:2105] Intake/Output from this shift: Total I/O In: 183.2 [I.V.:78.2; NG/GT:105] Out: -   Labs:  Basename 11/19/2012 0500 11/14/2012 0500 11/21/2012 0500  WBC -- 12.4* 12.0*  HGB -- 8.5* 8.5*  PLT -- 162 143*  LABCREA -- -- --  CREATININE 0.93 1.04 1.12   Estimated Creatinine Clearance: 66.3 ml/min (by C-G formula based on Cr of 0.93).  Basename 11/24/2012 1030  VANCOTROUGH 8.3*  VANCOPEAK --  VANCORANDOM --  GENTTROUGH --  GENTPEAK --  GENTRANDOM --  TOBRATROUGH --  TOBRAPEAK --  TOBRARND --  AMIKACINPEAK --  AMIKACINTROU --  AMIKACIN --     Microbiology: Recent Results (from the past 720 hour(s))  MRSA PCR SCREENING     Status: Abnormal   Collection Time   11/11/2012  9:04 PM      Component Value Range Status Comment   MRSA by PCR POSITIVE (*) NEGATIVE Final   CULTURE, BLOOD (ROUTINE X 2)     Status: Normal (Preliminary result)   Collection Time   10/27/2012  5:00 PM      Component Value Range Status Comment   Specimen Description BLOOD LEFT HAND   Final    Special Requests BOTTLES DRAWN AEROBIC ONLY 6CC   Final    Culture  Setup Time 11/22/2012 20:58   Final    Culture     Final    Value:        BLOOD CULTURE RECEIVED NO GROWTH TO DATE CULTURE WILL BE HELD FOR 5  DAYS BEFORE ISSUING A FINAL NEGATIVE REPORT   Report Status PENDING   Incomplete   CULTURE, BLOOD (ROUTINE X 2)     Status: Normal (Preliminary result)   Collection Time   11/18/2012  5:10 PM      Component Value Range Status Comment   Specimen Description BLOOD LEFT FOOT   Final    Special Requests BOTTLES DRAWN AEROBIC ONLY 3CC   Final    Culture  Setup Time 11/21/2012 20:58   Final    Culture     Final    Value:        BLOOD CULTURE RECEIVED NO GROWTH TO DATE CULTURE WILL BE HELD FOR 5 DAYS BEFORE ISSUING A FINAL NEGATIVE REPORT   Report Status PENDING   Incomplete   CULTURE, RESPIRATORY     Status: Normal   Collection Time   11/03/2012 10:45 AM      Component Value Range Status Comment   Specimen Description ENDOTRACHEAL   Final    Special Requests Normal   Final    Gram Stain     Final    Value: RARE WBC PRESENT,BOTH PMN AND MONONUCLEAR     FEW SQUAMOUS EPITHELIAL CELLS PRESENT  FEW GRAM NEGATIVE RODS     FEW GRAM POSITIVE RODS     FEW GRAM POSITIVE COCCI IN CHAINS   Culture Non-Pathogenic Oropharyngeal-type Flora Isolated.   Final    Report Status 11/14/2012 FINAL   Final     Anti-infectives     Start     Dose/Rate Route Frequency Ordered Stop   11/10/2012 1230   vancomycin (VANCOCIN) 750 mg in sodium chloride 0.9 % 150 mL IVPB        750 mg 150 mL/hr over 60 Minutes Intravenous Every 8 hours 11/18/2012 1136     11/23/2012 1100   vancomycin (VANCOCIN) 750 mg in sodium chloride 0.9 % 150 mL IVPB  Status:  Discontinued        750 mg 150 mL/hr over 60 Minutes Intravenous Every 12 hours 11/17/2012 0957 11/24/2012 1136   11/16/2012 1100   levofloxacin (LEVAQUIN) IVPB 750 mg        750 mg 100 mL/hr over 90 Minutes Intravenous Every 24 hours 11/10/2012 0957     11/19/2012 1800   ciprofloxacin (CIPRO) IVPB 400 mg  Status:  Discontinued        400 mg 200 mL/hr over 60 Minutes Intravenous Every 12 hours 11/02/2012 1800 11/11/2012 0819          Assessment: 78 y/o male s/p cardiac arrest and  arctic sun protocol on Vancomycin and Levaquin per pharmacy for PNA/possbile UTI. WBC 12.4<12, Tmax 99.8, CXR reveals stable bilateral airspace disease. Respiratory culture with non-pathogenic flora. Vancomycin trough today is 8.3. Renal function is stable with CrCl ~ 65-70. Will increase dose and re-check another trough when appropriate.  12/11 Vanco 12/11 Levaquin 12/8 Cipro >> 12/11  Goal of Therapy:  Vancomycin trough level 15-20 mcg/ml  Plan:  -Increase vancomycin to 750mg  IV q8h -Trend renal function/WBC/temp/micro data -Consider vancomycin trough before fourth dose  Abran Duke, PharmD Clinical Pharmacist Phone: [phone] Pager: [phone] 11/18/2012 11:46 AM

## 2012-11-06 NOTE — Progress Notes (Signed)
eLink Physician-Brief Progress Note Patient Name: [name] Christensen DOB: 1932-11-19 MRN: [id number]  Date of Service  10/25/2012   HPI/Events of Note   Patient remains NPO and no longer has NGT  eICU Interventions  Lopressor changed to IV   Intervention Category Minor Interventions: Routine modifications to care plan (e.g. PRN medications for pain, fever)  Lyan Moyano 10/28/2012, 11:13 PM

## 2012-11-06 NOTE — Procedures (Signed)
EEG NUMBER:  [id number].  REFERRING PHYSICIAN:  Dr. Cena Benton.  INDICATION FOR STUDY:  A 78 year old man, status post cardiac arrest on October 31, 2012.  He underwent hypothermia protocol.  The patient is responsive with eye opening only.  Study is being performed to assess severity of encephalopathy as well as to rule out possible seizure activity.  DESCRIPTION:  This is a routine EEG recording performed at the patient's bedside in the intensive care unit.  The predominant background activity consisted of low amplitude, 1-2 Hz diffuse symmetrical delta activity with superimposed intermittent irregular 5-7 Hz theta activity. Frequent runs of low-amplitude beta activity were recorded from the frontal regions.  Photic stimulation produced no appreciable occipital driving response.  No epileptiform discharges were recorded.  INTERPRETATION:  This EEG shows continuous severe slowing of cerebral activity consistent with severe encephalopathy.  No evidence of epileptic activity was demonstrated.     [name] Christmas, MD    [id number] D:  10/26/2012 13:24:24  T:  10/25/2012 02:24:40  Job #:  [id number]

## 2012-11-06 NOTE — Progress Notes (Signed)
Speech Language Pathology  Patient Details Name: [name] Christensen MRN: [id number] DOB: February 27, 1932 Today's Date: 10/30/2012 Time:  -     Received order for swallow assessment.  Pt. Extubated this a.m. (RN unavailable and SLP unsure of exact time of extubation).  Will see pt. This afternoon per policy (swallow assessment 4 hrs after extubation).  Breck Coons [place].Ed [employer] [phone]  10/26/2012

## 2012-11-06 NOTE — Progress Notes (Signed)
PULMONARY  / CRITICAL CARE MEDICINE  Name: [name] Christensen MRN: [id number] DOB: 02/14/1932    LOS: 6  CHIEF COMPLAINT:  Cardiac Arrest  BRIEF PATIENT DESCRIPTION:  78 yo male admitted to [facility] on 11/15/2012 with cardiac arrest, VDRF with subsequent transfer to [facility] for hypothermia protocol.  Significant PMHx of CAD s/p CABG, HTN, Hyperlipidemia, GERD, BPH  LINES / TUBES: ETT 12/7 >>>12/13 Rt IJ CVL 12/7>>> Rt radial aline 12/7>>>12/10  CULTURES: MRSA screen 12/07>>POSITIVE Blood 12/08>> Sputum 12/11>>oral flora  ANTIBIOTICS: Cipro 12/08>>12/11 Vancomycin 12/11>> Levaquin 12/11>>   EVENTS:  12/7 Out of hospital arrest, estimated 15 minutes down time 12/8- slight brady with cooling, low dose levo needs 12/9 Rewarmed 12/10 Off pressors 12/11 Increased respiratory secretions, low grade temperature 12/12 Changed to precedex 12/13 Improved mental status  TESTS: 12/07 CT head >> Mild atrophy, chronic small vessel white matter ischemic changes.  Chronic bilateral ethmoid, frontal and sphenoid sinusitis. 12/08 Echo >> EF 40 to 45%, grade 2 diastolic dysfx 12/12 EEG >> severe slowing of cerebral activity 12/12 MRI Brain >>mild/mod small vessel disease, global atrophy.  Opacification mastoid air cells and middle ear cavity bilaterally.  Paranasal sinus opacification   LEVEL OF CARE:  ICU PRIMARY SERVICE:  PCCM CONSULTANTS:  [facility] (signed off 12/11) CODE STATUS: Full DIET:  Tube feeds DVT Px:  Heparin SQ GI Px:  protonix   INTERVAL HISTORY:  Follows commands.  Tolerating SBT.  VITAL SIGNS: Temp:  [97.1 F (36.2 C)-99.8 F (37.7 C)] 98.8 F (37.1 C) (12/13 0800) Pulse Rate:  [58-105] 76  (12/13 0900) Resp:  [16-24] 24  (12/13 0900) BP: (106-173)/(37-84) 166/70 mmHg (12/13 0900) SpO2:  [91 %-100 %] 100 % (12/13 0900) FiO2 (%):  [40 %] 40 % (12/13 0832) Weight:  [157 lb 13.6 oz (71.6 kg)] 157 lb 13.6 oz (71.6 kg) (12/13 0443) HEMODYNAMICS: CVP:  [4  mmHg-10 mmHg] 5 mmHg VENTILATOR SETTINGS: Vent Mode:  [-] PRVC FiO2 (%):  [40 %] 40 % Set Rate:  [14 bmp] 14 bmp Vt Set:  [610 mL] 610 mL PEEP:  [5 cmH20] 5 cmH20 Plateau Pressure:  [21 cmH20-22 cmH20] 21 cmH20 INTAKE / OUTPUT: Intake/Output      12/12 0701 - 12/13 0700 12/13 0701 - 12/14 0700   I.V. (mL/kg) 634.8 (8.9) 58.2 (0.8)   NG/GT 1020 105   IV Piggyback 450    Total Intake(mL/kg) 2104.8 (29.4) 163.2 (2.3)   Urine (mL/kg/hr) 2105 (1.2)    Total Output 2105    Net -0.2 +163.2           PHYSICAL EXAMINATION: General: No distress Neuro:  Calm, follows commands, moves all extremities HEENT:  ETT in place Cardiovascular: s1s2 no murmur Lungs:  Scattered rhonchi Abdomen:  Soft, non tender, + bowel sounds Musculoskeletal:  No edema Skin: no rashes   LABS: Cbc  Lab 11/19/2012 0500 11/18/2012 0500 11/20/2012 0200  WBC 12.4* -- --  HGB 8.5* 8.5* 9.3*  HCT 24.7* 24.3* 26.5*  PLT 162 143* 153    Chemistry   Lab 11/15/2012 0500 11/13/2012 0500 10/25/2012 0500 11/18/2012 1445 11/13/2012 1600  NA 142 137 131* -- --  K 3.5 3.8 2.8* -- --  CL 106 102 94* -- --  CO2 28 29 30  -- --  BUN 36* 30* 23 -- --  CREATININE 0.93 1.04 1.12 -- --  CALCIUM 9.9 9.4 8.7 -- --  MG -- -- -- 1.5 1.6  PHOS -- -- -- -- --  GLUCOSE  152* 148* 157* -- --    Liver fxn  Lab 10/30/2012 0200 11/20/2012 0345 11/21/2012 0411  AST 54* 89* 209*  ALT 93* 139* 192*  ALKPHOS 57 61 61  BILITOT 0.7 0.5 0.7  PROT 5.2* 5.2* 5.0*  ALBUMIN 2.2* 2.3* 2.4*   coags  Lab 11/21/2012 0011 11/14/2012 1805  APTT >200* 58*  INR 1.29 1.23   Sepsis markers  Lab 11/24/2012 1725 11/21/2012 1805  LATICACIDVEN 1.7 1.1  PROCALCITON -- --   Cardiac markers  Lab 10/25/2012 0345 11/21/2012 2200 11/19/2012 1600  CKTOTAL -- -- --  CKMB -- -- --  TROPONINI <0.30 <0.30 <0.30   BNP  Lab 11/15/2012 0410  PROBNP 1702.0*   ABG  Lab 10/28/2012 1012 11/22/2012 1747 11/13/2012 1604  PHART 7.424 7.244* 7.185*  PCO2ART 36.6 42.9 52.2*  PO2ART  58.0* 52.0* 65.0*  HCO3 23.9 19.5* 20.8  TCO2 25 21 23     CBG trend  Lab 10/28/2012 0752 10/27/2012 0341 10/30/2012 2336 11/19/2012 1952 10/25/2012 1548  GLUCAP 131* 122* 120* 112* 104*    IMAGING: Mr Lodema Pilot Contrast  11/16/2012  *RADIOLOGY REPORT*  Clinical Data: Post cardiac arrest.  Assess for anoxic brain injury.  MRI HEAD WITHOUT AND WITH CONTRAST  Technique:  Multiplanar, multiecho pulse sequences of the brain and surrounding structures were obtained according to standard protocol without and with intravenous contrast  Contrast: 14mL MULTIHANCE GADOBENATE DIMEGLUMINE 529 MG/ML IV SOLN  Comparison: 11/15/2012 CT.  No comparison MR.  Findings: No findings of diffuse anoxic brain injury.  On the diffusion sequence, there is a tiny area of altered signal intensity within the medial aspect of the left centrum semiovale which may be related to artifact rather than tiny acute infarct.  Mild to moderate small vessel disease type changes.  Global atrophy without hydrocephalus.  No intracranial hemorrhage.  No intracranial mass or abnormal enhancement.  Opacification mastoid air cells and middle ear cavity bilaterally. Paranasal sinus opacification most notable ethmoid sinus air cells and sphenoid sinus air cells.  Major intracranial vascular structures are patent.  Mild cervical spinal stenosis.  IMPRESSION: No findings of diffuse anoxic brain injury.  Mild to moderate small vessel disease type changes.  Global atrophy without hydrocephalus.  No intracranial mass or abnormal enhancement.  Opacification mastoid air cells and middle ear cavity bilaterally. Paranasal sinus opacification most notable ethmoid sinus air cells and sphenoid sinus air cells.   Original Report Authenticated By: Lacy Duverney, M.D.    Dg Chest Port 1 View  10/27/2012  *RADIOLOGY REPORT*  Clinical Data: CHF  PORTABLE CHEST - 1 VIEW  Comparison: Yesterday  Findings: Endotracheal tube, NG tube, right internal jugular central venous  catheters stable.  Patchy bilateral airspace opacities are stable.  Low volumes. Borderline cardiomegaly.  No pneumothorax.  IMPRESSION: Stable bilateral airspace disease.   Original Report Authenticated By: Jolaine Click, M.D.    Dg Chest Port 1 View  10/28/2012  *RADIOLOGY REPORT*  Clinical Data:  follow up infiltrates  PORTABLE CHEST - 1 VIEW  Comparison: 10/25/2012  Findings: ET tube tip is above the carina. Right IJ catheter tip is in the projection of the distal SVC.  There is a nasogastric tube with side port below GE junction.  Heart size is normal.  Posterior layering right pleural effusion is unchanged in volume from previous exam.  Diffuse interstitial and airspace densities are identified bilaterally;  Not significantly changed from previous exam.  IMPRESSION:  1.  No change in aeration the lungs  compared with previous exam.   Original Report Authenticated By: Signa Kell, M.D.      DIAGNOSES: Active Problems:  HYPERLIPIDEMIA  HYPERTENSION  CORONARY ARTERY DISEASE  CORONARY ARTERY BYPASS GRAFT, HX OF  Sudden cardiac death  Acute respiratory failure  Anoxic encephalopathy   ASSESSMENT / PLAN:  PULMONARY  ASSESSMENT:  Acute respiratory failure 2nd to cardiac arrest with acute pulmonary edema, complicated by HCAP.  PLAN:   Proceed with extubation 12/13 F/u CXR intermittently Adjust FiO2 to keep Spo2 > 92%  CARDIOVASCULAR  ASSESSMENT:  Presumed VF cardiac arrest. Acute on chronic systolic, diastolic CHF. Hx of CAD, HTN, Hyperlipidemia.  PLAN:  Continue aspirin 81 mg qd Continue lopressor 50 mg bid Will ask cardiology to re-assess after extubation now that mental status has improved  RENAL  ASSESSMENT:   Hyponatremia. Likely from hypervolemia.  Resolved with diuresis. Hypokalemia.  PLAN:   Continue lasix 40 mg qd F/u and replace electrolytes as needed  GASTROINTESTINAL  ASSESSMENT:   Elevated LFT's.   Likely from cardiac arrest/shock.   Improving. Hepatitis C Ab reactive from 11/21/2012>>Hep C RNA PCR negative 11/18/2012. Nutrition.  PLAN:   Swallow evaluation after extubation, and then advance diet as tolerated  HEMATOLOGIC  ASSESSMENT:   Mild anemia. Mild thrombocytopenia.  PLAN:  F/u CBC intermittently  INFECTIOUS  ASSESSMENT:  ?UTI. Increased respiratory secretions, low grade temperature, persistent pulmonary infiltrates 12/11. Hx of PCN allergy. PLAN:   D6/x Abx, D3/x  vancomycin, levaquin   ENDOCRINE  ASSESSMENT:   Hyperglycemia 2nd to acute stress. No hx of DM.  PLAN:   SSI  NEUROLOGIC  ASSESSMENT:   Acute encephalopathy 2nd to cardiac arrest. Agitation much better with change to precedex 12/12.  Much improved mental status 12/13.  PLAN:   Monitor mental status and wean off precedex as tolerated after extubation  Updated family.  Critical care time 35 minutes.  Coralyn Helling, MD [facility] Pulmonary/Critical Care 11/22/2012, 10:00 AM Pager:  [phone] After 3pm call: [phone]

## 2012-11-06 NOTE — Progress Notes (Signed)
Speech Language Pathology  Patient Details Name: [name] Christensen MRN: [id number] DOB: 1932/10/22 Today's Date: 11/04/2012 Time:  -    Attempted BSE after extubation 10:30 this am, however pt.'s sats dropped and he currently requiring venti mask.  Will defer swallow assessment until tomorrow      Royce Macadamia M.Ed [employer] [phone]  11/16/2012

## 2012-11-07 LAB — BASIC METABOLIC PANEL
BUN: 32 mg/dL — ABNORMAL HIGH (ref 6–23)
CO2: 28 mEq/L (ref 19–32)
Calcium: 10 mg/dL (ref 8.4–10.5)
Chloride: 109 mEq/L (ref 96–112)
Creatinine, Ser: 1.07 mg/dL (ref 0.50–1.35)
GFR calc Af Amer: 75 mL/min — ABNORMAL LOW (ref >90)
GFR calc non Af Amer: 64 mL/min — ABNORMAL LOW (ref >90)
Glucose, Bld: 119 mg/dL — ABNORMAL HIGH (ref 70–99)
Potassium: 3.3 mEq/L — ABNORMAL LOW (ref 3.5–5.1)
Sodium: 146 mEq/L — ABNORMAL HIGH (ref 135–145)

## 2012-11-07 LAB — IRON AND TIBC
Iron: 11 ug/dL — ABNORMAL LOW (ref 42–135)
Saturation Ratios: 9 % — ABNORMAL LOW (ref 20–55)
TIBC: 124 ug/dL — ABNORMAL LOW (ref 215–435)
UIBC: 113 ug/dL — ABNORMAL LOW (ref 125–400)

## 2012-11-07 LAB — CBC
HCT: 24 % — ABNORMAL LOW (ref 39.0–52.0)
Hemoglobin: 8.4 g/dL — ABNORMAL LOW (ref 13.0–17.0)
MCH: 30.1 pg (ref 26.0–34.0)
MCHC: 35 g/dL (ref 30.0–36.0)
MCV: 86 fL (ref 78.0–100.0)
Platelets: 241 10*3/uL (ref 150–400)
RBC: 2.79 MIL/uL — ABNORMAL LOW (ref 4.22–5.81)
RDW: 13.7 % (ref 11.5–15.5)
WBC: 13.4 10*3/uL — ABNORMAL HIGH (ref 4.0–10.5)

## 2012-11-07 LAB — CULTURE, BLOOD (ROUTINE X 2)
Culture: NO GROWTH
Culture: NO GROWTH

## 2012-11-07 LAB — GLUCOSE, CAPILLARY
Glucose-Capillary: 106 mg/dL — ABNORMAL HIGH (ref 70–99)
Glucose-Capillary: 110 mg/dL — ABNORMAL HIGH (ref 70–99)
Glucose-Capillary: 75 mg/dL (ref 70–99)

## 2012-11-07 LAB — FERRITIN: Ferritin: 476 ng/mL — ABNORMAL HIGH (ref 22–322)

## 2012-11-07 MED ORDER — POTASSIUM CHLORIDE 10 MEQ/50ML IV SOLN
10.0000 meq | INTRAVENOUS | Status: AC
  Administered 2012-11-07 (×4): 10 meq via INTRAVENOUS
  Filled 2012-11-07: qty 200

## 2012-11-07 MED ORDER — SODIUM CHLORIDE 0.9 % IV SOLN: INTRAVENOUS | Status: DC | PRN

## 2012-11-07 MED ORDER — LORAZEPAM 2 MG/ML IJ SOLN
INTRAMUSCULAR | Status: AC
  Filled 2012-11-07: qty 1

## 2012-11-07 MED ORDER — MAGIC MOUTHWASH
2.0000 mL | Freq: Four times a day (QID) | ORAL | Status: DC
  Administered 2012-11-07: 20:00:00 via ORAL
  Administered 2012-11-08 – 2012-11-09 (×6): 2 mL via ORAL
  Administered 2012-11-09 (×2): via ORAL
  Administered 2012-11-09 – 2012-11-12 (×9): 2 mL via ORAL
  Filled 2012-11-07 (×25): qty 5

## 2012-11-07 MED ORDER — METOPROLOL TARTRATE 1 MG/ML IV SOLN
5.0000 mg | Freq: Four times a day (QID) | INTRAVENOUS | Status: DC
  Administered 2012-11-07 – 2012-11-09 (×7): 5 mg via INTRAVENOUS
  Filled 2012-11-07 (×10): qty 5

## 2012-11-07 MED ORDER — LORAZEPAM 2 MG/ML IJ SOLN
0.5000 mg | Freq: Once | INTRAMUSCULAR | Status: AC
  Administered 2012-11-07: 0.5 mg via INTRAVENOUS

## 2012-11-07 MED ORDER — FUROSEMIDE 10 MG/ML IJ SOLN
40.0000 mg | Freq: Every day | INTRAMUSCULAR | Status: DC
  Administered 2012-11-07 – 2012-11-09 (×3): 40 mg via INTRAVENOUS
  Filled 2012-11-07 (×3): qty 4

## 2012-11-07 NOTE — Progress Notes (Signed)
Pt enjoying percussion mode of bed

## 2012-11-07 NOTE — Progress Notes (Signed)
SLP Cancellation Note  Patient Details Name: ANANTH FIALLOS MRN: [id number] DOB: 1932/10/07   Cancelled treatment:  Attempt x1 to complete BSE . Unable to complete as patient's oxygen saturations in upper 80's with nasal cannula trial.  RN to page treating SLP if oxygen saturations improve this date.  ST to follow in acute care setting.       Moreen Fowler MS, CCC-SLP [phone] [facility] 11/05/2012, 2:34 PM

## 2012-11-07 NOTE — Progress Notes (Signed)
eLink Physician-Brief Progress Note Patient Name: [name] Christensen DOB: 27-Sep-1932 MRN: [id number]  Date of Service  2012-11-21   HPI/Events of Note  Call from nurse that patient is tachypenic to the 40s. Extubated today.   Current BiPAP settings are minimal.  Paitent is alert and pulling good volumes.  In NAD and states he wants something to help him sleep.  Seen at bedside by Dr. Marry Guan   eICU Interventions  Plan: Increase support on BiPAP 0.5 mg ativan for anxiety Monitor resp status   Intervention Category Intermediate Interventions: Respiratory distress - evaluation and management Minor Interventions: Agitation / anxiety - evaluation and management  DETERDING,ELIZABETH 21-Nov-2012, 1:44 AM

## 2012-11-07 NOTE — Progress Notes (Signed)
PULMONARY  / CRITICAL CARE MEDICINE  Name: [name] Christensen MRN: [id number] DOB: 04-Jan-1932    LOS: 7  CHIEF COMPLAINT:  Cardiac Arrest  BRIEF PATIENT DESCRIPTION:  78 yo male admitted to [facility] on 10/28/2012 with cardiac arrest, VDRF with subsequent transfer to [facility] for hypothermia protocol.  Significant PMHx of CAD s/p CABG, HTN, Hyperlipidemia, GERD, BPH  LINES / TUBES: ETT 12/7 >>>12/13 Rt IJ CVL 12/7>>> Rt radial aline 12/7>>>12/10  CULTURES: MRSA screen 12/07>>POSITIVE Blood 12/08>> Sputum 12/11>>oral flora  ANTIBIOTICS: Cipro 12/08>>12/11 Vancomycin 12/11>> Levaquin 12/11>>   EVENTS:  12/7 Out of hospital arrest, estimated 15 minutes down time 12/8- slight brady with cooling, low dose levo needs 12/9 Rewarmed 12/10 Off pressors 12/11 Increased respiratory secretions, low grade temperature 12/12 Changed to precedex 12/13 Improved mental status  TESTS: 12/07 CT head >> Mild atrophy, chronic small vessel white matter ischemic changes.  Chronic bilateral ethmoid, frontal and sphenoid sinusitis. 12/08 Echo >> EF 40 to 45%, grade 2 diastolic dysfx 12/12 EEG >> severe slowing of cerebral activity 12/12 MRI Brain >>mild/mod small vessel disease, global atrophy.  Opacification mastoid air cells and middle ear cavity bilaterally.  Paranasal sinus opacification   LEVEL OF CARE:  SDU PRIMARY SERVICE:  PCCM CONSULTANTS:  [facility] (signed off 12/11) CODE STATUS: Full DIET:  Tube feeds DVT Px:  Heparin SQ GI Px:  protonix   INTERVAL HISTORY:  Denies cough, chest pain, or abdominal pain.  Needed BiPAP overnight.  VITAL SIGNS: Temp:  [98.3 F (36.8 C)-100.1 F (37.8 C)] 99.2 F (37.3 C) (12/14 0813) Pulse Rate:  [43-112] 93  (12/14 0813) Resp:  [20-31] 25  (12/14 0800) BP: (131-172)/(38-85) 158/68 mmHg (12/14 0800) SpO2:  [89 %-99 %] 96 % (12/14 0813) FiO2 (%):  [50 %] 50 % (12/14 0800) Weight:  [152 lb 1.9 oz (69 kg)] 152 lb 1.9 oz (69 kg) (12/14  0500) HEMODYNAMICS: CVP:  [3 mmHg-7 mmHg] 3 mmHg VENTILATOR SETTINGS: Vent Mode:  [-]  FiO2 (%):  [50 %] 50 % INTAKE / OUTPUT: Intake/Output      12/13 0701 - 12/14 0700 12/14 0701 - 12/15 0700   I.V. (mL/kg) 498.2 (7.2)    NG/GT 105    IV Piggyback 710    Total Intake(mL/kg) 1313.2 (19)    Urine (mL/kg/hr) 1800 (1.1)    Total Output 1800    Net -486.8            PHYSICAL EXAMINATION: General: No distress Neuro:  Calm, follows commands, moves all extremities, trouble with memory, speech improved HEENT:  No sinus tenderness Cardiovascular: s1s2 no murmur Lungs:  Scattered rhonchi Abdomen:  Soft, non tender, + bowel sounds Musculoskeletal:  No edema Skin: no rashes   LABS: Cbc  Lab 11/18/2012 0450 11/17/2012 0500 11/21/2012 0500  WBC 13.4* -- --  HGB 8.4* 8.5* 8.5*  HCT 24.0* 24.7* 24.3*  PLT 241 162 143*    Chemistry   Lab 11/22/2012 0450 11/16/2012 0500 11/15/2012 0500 11/19/2012 1445 11/17/2012 1600  NA 146* 142 137 -- --  K 3.3* 3.5 3.8 -- --  CL 109 106 102 -- --  CO2 28 28 29  -- --  BUN 32* 36* 30* -- --  CREATININE 1.07 0.93 1.04 -- --  CALCIUM 10.0 9.9 9.4 -- --  MG -- -- -- 1.5 1.6  PHOS -- -- -- -- --  GLUCOSE 119* 152* 148* -- --    Liver fxn  Lab 11/19/2012 0200 11/14/2012 0345 11/22/2012 0411  AST 54* 89* 209*  ALT 93* 139* 192*  ALKPHOS 57 61 61  BILITOT 0.7 0.5 0.7  PROT 5.2* 5.2* 5.0*  ALBUMIN 2.2* 2.3* 2.4*   coags  Lab 11/14/2012 0011 11/24/2012 1805  APTT >200* 58*  INR 1.29 1.23   Sepsis markers  Lab 11/22/2012 1725 11/18/2012 1805  LATICACIDVEN 1.7 1.1  PROCALCITON -- --   Cardiac markers  Lab 11/14/2012 0345 11/15/2012 2200 10/28/2012 1600  CKTOTAL -- -- --  CKMB -- -- --  TROPONINI <0.30 <0.30 <0.30   BNP  Lab 11/15/2012 0410  PROBNP 1702.0*   ABG  Lab 10/28/2012 1012 11/21/2012 1747 11/17/2012 1604  PHART 7.424 7.244* 7.185*  PCO2ART 36.6 42.9 52.2*  PO2ART 58.0* 52.0* 65.0*  HCO3 23.9 19.5* 20.8  TCO2 25 21 23     CBG trend  Lab  11/13/2012 0816 10/30/2012 0444 11/19/2012 2324 10/30/2012 1957 10/29/2012 1532  GLUCAP 110* 106* 96 103* 101*    IMAGING: Mr Lodema Pilot Contrast  10/26/2012  *RADIOLOGY REPORT*  Clinical Data: Post cardiac arrest.  Assess for anoxic brain injury.  MRI HEAD WITHOUT AND WITH CONTRAST  Technique:  Multiplanar, multiecho pulse sequences of the brain and surrounding structures were obtained according to standard protocol without and with intravenous contrast  Contrast: 14mL MULTIHANCE GADOBENATE DIMEGLUMINE 529 MG/ML IV SOLN  Comparison: 11/13/2012 CT.  No comparison MR.  Findings: No findings of diffuse anoxic brain injury.  On the diffusion sequence, there is a tiny area of altered signal intensity within the medial aspect of the left centrum semiovale which may be related to artifact rather than tiny acute infarct.  Mild to moderate small vessel disease type changes.  Global atrophy without hydrocephalus.  No intracranial hemorrhage.  No intracranial mass or abnormal enhancement.  Opacification mastoid air cells and middle ear cavity bilaterally. Paranasal sinus opacification most notable ethmoid sinus air cells and sphenoid sinus air cells.  Major intracranial vascular structures are patent.  Mild cervical spinal stenosis.  IMPRESSION: No findings of diffuse anoxic brain injury.  Mild to moderate small vessel disease type changes.  Global atrophy without hydrocephalus.  No intracranial mass or abnormal enhancement.  Opacification mastoid air cells and middle ear cavity bilaterally. Paranasal sinus opacification most notable ethmoid sinus air cells and sphenoid sinus air cells.   Original Report Authenticated By: Lacy Duverney, M.D.    Dg Chest Port 1 View  11/22/2012  *RADIOLOGY REPORT*  Clinical Data: CHF  PORTABLE CHEST - 1 VIEW  Comparison: Yesterday  Findings: Endotracheal tube, NG tube, right internal jugular central venous catheters stable.  Patchy bilateral airspace opacities are stable.  Low volumes.  Borderline cardiomegaly.  No pneumothorax.  IMPRESSION: Stable bilateral airspace disease.   Original Report Authenticated By: Jolaine Click, M.D.      DIAGNOSES: Active Problems:  HYPERLIPIDEMIA  HYPERTENSION  CORONARY ARTERY DISEASE  CORONARY ARTERY BYPASS GRAFT, HX OF  Sudden cardiac death  Acute respiratory failure  Pneumonia   ASSESSMENT / PLAN:  PULMONARY  ASSESSMENT:  Acute respiratory failure 2nd to cardiac arrest with acute pulmonary edema, complicated by HCAP. Much improved.  PLAN:   BiPAP prn F/u CXR 12/15 Adjust FiO2 to keep Spo2 > 92%  CARDIOVASCULAR  ASSESSMENT:  Presumed VF cardiac arrest. Acute on chronic systolic, diastolic CHF. Hx of CAD, HTN, Hyperlipidemia.  PLAN:  Continue aspirin 81 mg qd Change lopressor to 5 mg IV q6h until able to swallow Will ask cardiology to re-assess after extubation now that mental status has  improved  RENAL  ASSESSMENT:   Hyponatremia. Likely from hypervolemia.  Resolved with diuresis. Hypokalemia.  PLAN:   Change lasix 40 mg IV qd until able to swallow F/u and replace electrolytes as needed  GASTROINTESTINAL  ASSESSMENT:   Elevated LFT's.   Likely from cardiac arrest/shock.  Improving. Hepatitis C Ab reactive from 11/17/2012>>Hep C RNA PCR negative 10/29/2012. Nutrition. Dysphagia.  PLAN:   Follow up with speech therapy 12/14 for swallow evaluation, and then advance diet  HEMATOLOGIC  ASSESSMENT:   Mild anemia. Mild thrombocytopenia.  PLAN:  F/u CBC intermittently Check Iron/TIBC/Ferritin 12/14  INFECTIOUS  ASSESSMENT:  ?UTI. Increased respiratory secretions, low grade temperature, persistent pulmonary infiltrates 12/11. Hx of PCN allergy. PLAN:   D7/x Abx, D4/x  vancomycin, levaquin   ENDOCRINE  ASSESSMENT:   Hyperglycemia 2nd to acute stress. No hx of DM>>CBG's much improved  PLAN:   D/c SSI 12/14  NEUROLOGIC  ASSESSMENT:   Acute encephalopathy 2nd to cardiac arrest. Agitation  much better with change to precedex 12/12.  Much improved mental status 12/13>>continues to improve 12/14  PLAN:   Monitor mental status   Updated family at bedside.  Will ask PT/OT to assess, OOB.  Transfer to SDU.  Will need to ask cardiology to follow up on Monday 12/16 for additional cardiac interventions.  Coralyn Helling, MD [facility] Pulmonary/Critical Care 10/27/2012, 9:56 AM Pager:  [phone] After 3pm call: [phone]

## 2012-11-07 NOTE — Progress Notes (Signed)
Still chewing baby asa given 3 min .One cough  noted.The asa is well crushed w/in mouth .Pt is alert and oriented to person but not his age,month ,year ,his child's birth date ,etc..the patient stated he is in [facility] .

## 2012-11-07 NOTE — Progress Notes (Signed)
SLP Cancellation Note  Patient Details Name: [name] Christensen MRN: [id number] DOB: [birth year]   Cancelled treatment:    ST to re attempt BSE on 10/31/2012.  RN aware.   Moreen Fowler MS, CCC-SLP [phone] [facility] 11/23/2012, 5:10 PM

## 2012-11-08 ENCOUNTER — Inpatient Hospital Stay (HOSPITAL_COMMUNITY): Payer: Medicare Other

## 2012-11-08 LAB — BASIC METABOLIC PANEL
BUN: 33 mg/dL — ABNORMAL HIGH (ref 6–23)
CO2: 30 mEq/L (ref 19–32)
Calcium: 10.2 mg/dL (ref 8.4–10.5)
Chloride: 111 mEq/L (ref 96–112)
Creatinine, Ser: 1.02 mg/dL (ref 0.50–1.35)
GFR calc Af Amer: 79 mL/min — ABNORMAL LOW (ref >90)
GFR calc non Af Amer: 68 mL/min — ABNORMAL LOW (ref >90)
Glucose, Bld: 122 mg/dL — ABNORMAL HIGH (ref 70–99)
Potassium: 3.4 mEq/L — ABNORMAL LOW (ref 3.5–5.1)
Sodium: 149 mEq/L — ABNORMAL HIGH (ref 135–145)

## 2012-11-08 LAB — URINE MICROSCOPIC-ADD ON

## 2012-11-08 LAB — URINALYSIS, ROUTINE W REFLEX MICROSCOPIC
Bilirubin Urine: NEGATIVE
Glucose, UA: NEGATIVE mg/dL
Ketones, ur: NEGATIVE mg/dL
Leukocytes, UA: NEGATIVE
Nitrite: NEGATIVE
Protein, ur: NEGATIVE mg/dL
Specific Gravity, Urine: 1.013 (ref 1.005–1.030)
Urobilinogen, UA: 1 mg/dL (ref 0.0–1.0)
pH: 5.5 (ref 5.0–8.0)

## 2012-11-08 LAB — CBC
HCT: 27.6 % — ABNORMAL LOW (ref 39.0–52.0)
Hemoglobin: 9.1 g/dL — ABNORMAL LOW (ref 13.0–17.0)
MCH: 29.4 pg (ref 26.0–34.0)
MCHC: 33 g/dL (ref 30.0–36.0)
MCV: 89.3 fL (ref 78.0–100.0)
Platelets: 334 10*3/uL (ref 150–400)
RBC: 3.09 MIL/uL — ABNORMAL LOW (ref 4.22–5.81)
RDW: 14.1 % (ref 11.5–15.5)
WBC: 12.3 10*3/uL — ABNORMAL HIGH (ref 4.0–10.5)

## 2012-11-08 LAB — GLUCOSE, CAPILLARY
Glucose-Capillary: 105 mg/dL — ABNORMAL HIGH (ref 70–99)
Glucose-Capillary: 107 mg/dL — ABNORMAL HIGH (ref 70–99)
Glucose-Capillary: 118 mg/dL — ABNORMAL HIGH (ref 70–99)
Glucose-Capillary: 99 mg/dL (ref 70–99)

## 2012-11-08 MED ORDER — STARCH (THICKENING) PO POWD
ORAL | Status: DC | PRN
  Filled 2012-11-08: qty 227

## 2012-11-08 MED ORDER — STARCH (THICKENING) PO POWD: ORAL | Status: DC | PRN

## 2012-11-08 NOTE — Progress Notes (Signed)
Bladder scanner depicted >93ml  uop w/in bladder . Dr Kriste Basque paged.

## 2012-11-08 NOTE — Evaluation (Addendum)
Clinical/Bedside Swallow Evaluation Patient Details  Name: [name] Christensen MRN: [id number] Date of Birth: 31-May-1932  Today's Date: 11/20/2012 Time: 0905-1000 SLP Time Calculation (min): 55 min  Past Medical History:  Past Medical History  Diagnosis Date  . Coronary artery disease     s/p coronary bypass grafting 2005  . GERD (gastroesophageal reflux disease)   . Hyperlipidemia   . Hypertension   . BPH (benign prostatic hypertrophy)     elevated PSA Dr. Lindell Noe Bx 2010   Past Surgical History:  Past Surgical History  Procedure Date  . Cardiac catheterization 2007    with patent graft anatomy atretic left internal mammary  artery to the LAD which is nonobstructive. Will restart study June 08, 2007  . Coronary artery bypass graft 2005  . Lumbar fusion 09/2007   HPI:  78 y/o male with history of CAD/CABG, HTN.  Witnessed arrest at home with 15 minutes downtime.  ECG with RBBB. Transferred from [place]12/7, hypothermia initiated.  Patient intubated 12/7 to 12/13.  Patient referred for BSE due to concerns with aspiration.     Assessment / Plan / Recommendation Clinical Impression  Dysphagia indicated due to weakness.  +s/s of aspiration with change in vital signs s/p swallow of trial ice chips and thin water by cup due to delay in initiation. Patient expectorated large amount of mucous (clear) s/p thin water trials.   Moderate amount of throat clears s/p swallow of trial solids with obvious decreased endurance during mastication.  Due to overall  weakness recommend to proceed with conservative diet of dysphagia 1(puree)  and nectar thick liquids with full supervision as aspiration risk remains even with modified diet. Prognosis for safe, diet upgrade judged to be good based on prior level.  ST to follow in acute care setting for diet tolerance and advancement.      Aspiration Risk  Moderate    Diet Recommendation Dysphagia 1 (Puree); nectar thick    Liquid Administration via:  Cup Medication Administration: Crushed with puree Supervision: Patient able to self feed;Full supervision/cueing for compensatory strategies Compensations: Slow rate;Small sips/bites;Multiple dry swallows after each bite/sip;Clear throat intermittently Postural Changes and/or Swallow Maneuvers: Seated upright 90 degrees;Upright 30-60 min after meal    Other  Recommendations Oral Care Recommendations: Oral care QID Other Recommendations: Order thickener from pharmacy;Prohibited food (jello, ice cream, thin soups);Remove water pitcher;Have oral suction available;Clarify dietary restrictions   Follow Up Recommendations  Inpatient Rehab    Frequency and Duration min 2x/week  2 weeks   Pertinent Vitals/Pain 91% oxygen saturations with RR upper 25 s/p thin water trials    SLP Swallow Goals Patient will consume recommended diet without observed clinical signs of aspiration with: Supervision/safety Patient will utilize recommended strategies during swallow to increase swallowing safety with: Supervision/safety   Swallow Study Prior Functional Status   Lived at home Independent    General Date of Onset: 11/13/2012 HPI: 78 y/o male with history of CAD/CABG, HTN.  Witnessed arrest at home with 15 minutes downtime.  ECG with RBBB. Transferred from [place]12/7, hypothermia initiated.  Patient intubated 12/7 to 12/13.  Patient referred for BSE due to concerns with aspiration.   Type of Study: Bedside swallow evaluation Diet Prior to this Study: NPO Temperature Spikes Noted: No Respiratory Status: Supplemental O2 delivered via (comment) (Nasal cannula 2 L) History of Recent Intubation: Yes Length of Intubations (days): 6 days Date extubated: 11/19/2012 Behavior/Cognition: Alert;Cooperative;Pleasant mood Oral Cavity - Dentition: Adequate natural dentition Self-Feeding Abilities: Able to feed self Patient Positioning:  Upright in bed Baseline Vocal Quality: Hoarse;Low vocal intensity Volitional  Cough: Weak Volitional Swallow: Able to elicit    Oral/Motor/Sensory Function Overall Oral Motor/Sensory Function: Appears within functional limits for tasks assessed   Ice Chips Ice chips: Impaired Pharyngeal Phase Impairments: Suspected delayed Swallow;Decreased hyoid-laryngeal movement;Cough - Delayed;Change in Vital Signs   Thin Liquid Thin Liquid: Impaired Presentation: Cup;Spoon Pharyngeal  Phase Impairments: Suspected delayed Swallow;Decreased hyoid-laryngeal movement;Cough - Delayed;Change in Vital Signs    Nectar Thick Nectar Thick Liquid: Impaired Pharyngeal Phase Impairments: Suspected delayed Swallow;Decreased hyoid-laryngeal movement   Honey Thick Honey Thick Liquid: Not tested   Puree Puree: Impaired Pharyngeal Phase Impairments: Suspected delayed Swallow;Decreased hyoid-laryngeal movement   Solid   GO    Solid: Impaired Pharyngeal Phase Impairments: Suspected delayed Swallow;Decreased hyoid-laryngeal movement;Throat Clearing - Immediate      Moreen Fowler MS, CCC-SLP [phone] [facility] 10/30/2012,10:32 AM

## 2012-11-08 NOTE — Progress Notes (Signed)
#  16 Fr foley cath placed w/o diff .The patient tol well.Cath clamped when uop reached 700 ml in foley bag.

## 2012-11-08 NOTE — Progress Notes (Signed)
PULMONARY  / CRITICAL CARE MEDICINE  Name: [name] Christensen MRN: [id number] DOB: [birth year]    LOS: 8  CHIEF COMPLAINT:  Cardiac Arrest  BRIEF PATIENT DESCRIPTION:  78 yo male admitted to [facility] on 11/20/2012 with cardiac arrest, VDRF with subsequent transfer to [facility] for hypothermia protocol.  Significant PMHx of CAD s/p CABG, HTN, Hyperlipidemia, GERD, BPH  LINES / TUBES: ETT 12/7 >>>12/13 Rt IJ CVL 12/7>>> Rt radial aline 12/7>>>12/10  CULTURES: MRSA screen 12/07>>POSITIVE Blood 12/08>>negative Sputum 12/11>>oral flora  ANTIBIOTICS: Cipro 12/08>>12/11 Vancomycin 12/11>>12/15 Levaquin 12/11>>   EVENTS:  12/7 Out of hospital arrest, estimated 15 minutes down time 12/8- slight brady with cooling, low dose levo needs 12/9 Rewarmed 12/10 Off pressors 12/11 Increased respiratory secretions, low grade temperature 12/12 Changed to precedex 12/13 Improved mental status  TESTS: 12/07 CT head >> Mild atrophy, chronic small vessel white matter ischemic changes.  Chronic bilateral ethmoid, frontal and sphenoid sinusitis. 12/08 Echo >> EF 40 to 45%, grade 2 diastolic dysfx 12/12 EEG >> severe slowing of cerebral activity 12/12 MRI Brain >>mild/mod small vessel disease, global atrophy.  Opacification mastoid air cells and middle ear cavity bilaterally.  Paranasal sinus opacification   LEVEL OF CARE:  SDU PRIMARY SERVICE:  PCCM CONSULTANTS:  [facility] (signed off 12/11) CODE STATUS: Full DIET:  Tube feeds DVT Px:  Heparin SQ GI Px:  protonix   INTERVAL HISTORY:  More alert.  Decreased O2 needs.  Denies chest pain.  VITAL SIGNS: Temp:  [97.7 F (36.5 C)-98.4 F (36.9 C)] 98.1 F (36.7 C) (12/15 0400) Pulse Rate:  [82-100] 82  (12/15 0500) Resp:  [22-31] 22  (12/15 0500) BP: (142-163)/(60-73) 155/68 mmHg (12/15 0400) SpO2:  [74 %-98 %] 96 % (12/15 0500) FiO2 (%):  [24 %-50 %] 24 % (12/15 0332) Weight:  [145 lb 15.1 oz (66.2 kg)] 145 lb 15.1 oz (66.2 kg) (12/15  0400) HEMODYNAMICS:   VENTILATOR SETTINGS: Vent Mode:  [-]  FiO2 (%):  [24 %-50 %] 24 % INTAKE / OUTPUT: Intake/Output      12/14 0701 - 12/15 0700 12/15 0701 - 12/16 0700   I.V. (mL/kg) 100 (1.5)    NG/GT     IV Piggyback 804    Total Intake(mL/kg) 904 (13.7)    Urine (mL/kg/hr) 3100 (2)    Total Output 3100    Net -2196         Urine Occurrence 1 x       PHYSICAL EXAMINATION: General: No distress Neuro:  Calm, follows commands, moves all extremities, trouble with memory, speech improved HEENT:  No sinus tenderness Cardiovascular: s1s2 no murmur Lungs:  Scattered rhonchi Abdomen:  Soft, non tender, + bowel sounds Musculoskeletal:  No edema Skin: no rashes   LABS: Cbc  Lab 11-20-12 0500 11/16/2012 0450 11/24/2012 0500  WBC 12.3* -- --  HGB 9.1* 8.4* 8.5*  HCT 27.6* 24.0* 24.7*  PLT 334 241 162    Chemistry   Lab 20-Nov-2012 0500 10/25/2012 0450 11/24/2012 0500 11/14/2012 1445 11/22/2012 1600  NA 149* 146* 142 -- --  K 3.4* 3.3* 3.5 -- --  CL 111 109 106 -- --  CO2 30 28 28  -- --  BUN 33* 32* 36* -- --  CREATININE 1.02 1.07 0.93 -- --  CALCIUM 10.2 10.0 9.9 -- --  MG -- -- -- 1.5 1.6  PHOS -- -- -- -- --  GLUCOSE 122* 119* 152* -- --    Liver fxn  Lab 10/26/2012 0200 11/19/2012 0345  AST 54* 89*  ALT 93* 139*  ALKPHOS 57 61  BILITOT 0.7 0.5  PROT 5.2* 5.2*  ALBUMIN 2.2* 2.3*   coags No results found for this basename: APTT:3,INR:3 in the last 168 hours Sepsis markers  Lab 11/24/2012 1725  LATICACIDVEN 1.7  PROCALCITON --   Cardiac markers  Lab 11/15/2012 0345 11/22/2012 2200 11/17/2012 1600  CKTOTAL -- -- --  CKMB -- -- --  TROPONINI <0.30 <0.30 <0.30   BNP No results found for this basename: PROBNP:3 in the last 168 hours ABG  Lab 11/22/2012 1012 11/20/2012 1747 11/21/2012 1604  PHART 7.424 7.244* 7.185*  PCO2ART 36.6 42.9 52.2*  PO2ART 58.0* 52.0* 65.0*  HCO3 23.9 19.5* 20.8  TCO2 25 21 23     CBG trend  Lab 11/13/2012 0023 10/25/2012 2021 11/15/2012 1129  11/21/2012 0816 11/17/2012 0444  GLUCAP 105* 107* 75 110* 106*    IMAGING: Dg Chest Port 1 View  10/25/2012  *RADIOLOGY REPORT*  Clinical Data: Follow-up pneumonia, chest heart failure  PORTABLE CHEST - 1 VIEW  Comparison: Chest radiograph 10/26/2012  Findings: Interval extubation and removal of right central venous line.  Stable heart silhouette.  This mild improvement in bibasilar atelectasis.  There is a fine perihilar air space disease and small effusions.  No pneumothorax.  IMPRESSION:  1.  Fine perihilar air space disease suggest pulmonary edema. 2.  Small bilateral pleural effusions.  3.  Some improvement in basilar atelectasis.   Original Report Authenticated By: Genevive Bi, M.D.      DIAGNOSES: Active Problems:  HYPERLIPIDEMIA  HYPERTENSION  CORONARY ARTERY DISEASE  CORONARY ARTERY BYPASS GRAFT, HX OF  Sudden cardiac death  Acute respiratory failure  Pneumonia   ASSESSMENT / PLAN:  PULMONARY  ASSESSMENT:  Acute respiratory failure 2nd to cardiac arrest with acute pulmonary edema, complicated by HCAP. Much improved.  PLAN:   BiPAP prn F/u CXR intermittently Adjust FiO2 to keep Spo2 > 92%  CARDIOVASCULAR  ASSESSMENT:  Presumed VF cardiac arrest. Acute on chronic systolic, diastolic CHF. Hx of CAD, HTN, Hyperlipidemia.  PLAN:  Continue aspirin 81 mg qd Change lopressor to 5 mg IV q6h until able to swallow Will ask cardiology to re-assess 12/16 now that mental status improved  RENAL  ASSESSMENT:   Hyponatremia. Likely from hypervolemia.  Resolved with diuresis. Hypokalemia.  PLAN:   Changed lasix 40 mg IV qd until able to swallow F/u and replace electrolytes as needed  GASTROINTESTINAL  ASSESSMENT:   Elevated LFT's.   Likely from cardiac arrest/shock.  Improving. Hepatitis C Ab reactive from 10/25/2012>>Hep C RNA PCR negative 11/17/2012. Nutrition. Dysphagia.  PLAN:   Follow up with speech therapy 12/15 for swallow evaluation, and then advance  diet  HEMATOLOGIC  ASSESSMENT:   Anemia critical illness. Mild thrombocytopenia.  PLAN:  F/u CBC intermittently  INFECTIOUS  ASSESSMENT:  ?UTI. Increased respiratory secretions, low grade temperature, persistent pulmonary infiltrates 12/11. Hx of PCN allergy. PLAN:   D8/10 Abx, D5/7  levaquin  D/c vancomycin 12/15  ENDOCRINE  ASSESSMENT:   Hyperglycemia 2nd to acute stress. No hx of DM>>CBG's much improved  PLAN:   D/c'ed SSI 12/14 Monitor blood sugars on BMET  NEUROLOGIC  ASSESSMENT:   Acute encephalopathy 2nd to cardiac arrest. Agitation much better with change to precedex 12/12.  Much improved mental status 12/13>>continues to improve 12/15  PLAN:   Monitor mental status   Updated family at bedside.  PT/OT to assess, OOB.  Transfer to SDU when bed available.  Will need to  ask cardiology to follow up on Monday 12/16 for additional cardiac interventions.  Coralyn Helling, MD [facility] Pulmonary/Critical Care 10/26/2012, 9:55 AM Pager:  [phone] After 3pm call: [phone]

## 2012-11-09 DIAGNOSIS — G931 Anoxic brain damage, not elsewhere classified: Secondary | ICD-10-CM

## 2012-11-09 LAB — BASIC METABOLIC PANEL
BUN: 31 mg/dL — ABNORMAL HIGH (ref 6–23)
CO2: 33 mEq/L — ABNORMAL HIGH (ref 19–32)
Calcium: 10.5 mg/dL (ref 8.4–10.5)
Chloride: 112 mEq/L (ref 96–112)
Creatinine, Ser: 1.03 mg/dL (ref 0.50–1.35)
GFR calc Af Amer: 78 mL/min — ABNORMAL LOW (ref >90)
GFR calc non Af Amer: 67 mL/min — ABNORMAL LOW (ref >90)
Glucose, Bld: 183 mg/dL — ABNORMAL HIGH (ref 70–99)
Potassium: 3.7 mEq/L (ref 3.5–5.1)
Sodium: 151 mEq/L — ABNORMAL HIGH (ref 135–145)

## 2012-11-09 LAB — URINE CULTURE
Colony Count: NO GROWTH
Culture: NO GROWTH

## 2012-11-09 MED ORDER — RESOURCE THICKENUP CLEAR PO POWD
ORAL | Status: DC | PRN
  Filled 2012-11-09 (×2): qty 125

## 2012-11-09 MED ORDER — SODIUM CHLORIDE 0.9 % IV SOLN: 250.0000 mL | INTRAVENOUS | Status: DC | PRN

## 2012-11-09 MED ORDER — SODIUM CHLORIDE 0.9 % IJ SOLN: 3.0000 mL | INTRAMUSCULAR | Status: DC | PRN

## 2012-11-09 MED ORDER — ASPIRIN 81 MG PO CHEW
324.0000 mg | CHEWABLE_TABLET | ORAL | Status: AC
  Administered 2012-11-10: 324 mg via ORAL
  Filled 2012-11-09: qty 4

## 2012-11-09 MED ORDER — METOPROLOL TARTRATE 25 MG PO TABS
25.0000 mg | ORAL_TABLET | Freq: Two times a day (BID) | ORAL | Status: DC
  Administered 2012-11-09 – 2012-11-11 (×5): 25 mg via ORAL
  Filled 2012-11-09 (×5): qty 1

## 2012-11-09 MED ORDER — METOPROLOL TARTRATE 1 MG/ML IV SOLN
5.0000 mg | Freq: Four times a day (QID) | INTRAVENOUS | Status: DC | PRN
  Administered 2012-11-11: 5 mg via INTRAVENOUS
  Filled 2012-11-09: qty 5

## 2012-11-09 MED ORDER — DIAZEPAM 2 MG PO TABS: 2.0000 mg | ORAL_TABLET | ORAL | Status: DC

## 2012-11-09 MED ORDER — ENSURE PUDDING PO PUDG
1.0000 | Freq: Three times a day (TID) | ORAL | Status: DC
  Administered 2012-11-09 – 2012-11-12 (×5): 1 via ORAL

## 2012-11-09 MED ORDER — DEXTROSE 5 % IV SOLN
INTRAVENOUS | Status: DC
  Administered 2012-11-09: 50 mL via INTRAVENOUS

## 2012-11-09 MED ORDER — SODIUM CHLORIDE 0.9 % IJ SOLN
3.0000 mL | Freq: Two times a day (BID) | INTRAMUSCULAR | Status: DC
  Administered 2012-11-09: 3 mL via INTRAVENOUS

## 2012-11-09 MED ORDER — ATORVASTATIN CALCIUM 40 MG PO TABS
40.0000 mg | ORAL_TABLET | Freq: Every day | ORAL | Status: DC
  Administered 2012-11-09 – 2012-11-10 (×2): 40 mg via ORAL
  Filled 2012-11-09 (×4): qty 1

## 2012-11-09 NOTE — Progress Notes (Signed)
PULMONARY  / CRITICAL CARE MEDICINE  Name: [name] Christensen MRN: [id number] DOB: [birth year]    LOS: 9  CHIEF COMPLAINT:  Cardiac Arrest  BRIEF PATIENT DESCRIPTION:  78 yo male admitted to [facility] on 11/16/2012 with cardiac arrest, VDRF with subsequent transfer to [facility] for hypothermia protocol.  Significant PMHx of CAD s/p CABG, HTN, Hyperlipidemia, GERD, BPH  LINES / TUBES: ETT 12/7 >>>12/13 Rt IJ CVL 12/7>>> Rt radial aline 12/7>>>12/10  CULTURES: MRSA screen 12/07>>POSITIVE Blood 12/08>>negative Sputum 12/11>>oral flora  ANTIBIOTICS: Cipro 12/08>>12/11 Vancomycin 12/11>>12/15 Levaquin 12/11>>   EVENTS:  12/7 Out of hospital arrest, estimated 15 minutes down time 12/8- slight brady with cooling, low dose levo needs 12/9 Rewarmed 12/10 Off pressors 12/11 Increased respiratory secretions, low grade temperature 12/12 Changed to precedex 12/13 Improved mental status  TESTS: 12/07 CT head >> Mild atrophy, chronic small vessel white matter ischemic changes.  Chronic bilateral ethmoid, frontal and sphenoid sinusitis. 12/08 Echo >> EF 40 to 45%, grade 2 diastolic dysfx 12/12 EEG >> severe slowing of cerebral activity 12/12 MRI Brain >>mild/mod small vessel disease, global atrophy.  Opacification mastoid air cells and middle ear cavity bilaterally.  Paranasal sinus opacification   LEVEL OF CARE:  SDU PRIMARY SERVICE:  PCCM CONSULTANTS:  [facility] (signed off 12/11) CODE STATUS: Full DIET:  Tube feeds DVT Px:  Heparin SQ GI Px:  protonix   INTERVAL HISTORY:  More alert.  Decreased O2 needs.  Denies chest pain.  VITAL SIGNS: Temp:  [97 F (36.1 C)-99.1 F (37.3 C)] 97.6 F (36.4 C) (12/16 1219) Pulse Rate:  [81-112] 108  (12/16 1219) Resp:  [17-31] 22  (12/16 1219) BP: (142-176)/(67-82) 148/71 mmHg (12/16 1219) SpO2:  [83 %-99 %] 99 % (12/16 1219) FiO2 (%):  [50 %] 50 % (12/16 0400) Weight:  [60.2 kg (132 lb 11.5 oz)] 60.2 kg (132 lb 11.5 oz) (12/16  0500) HEMODYNAMICS:   VENTILATOR SETTINGS: Vent Mode:  [-]  FiO2 (%):  [50 %] 50 % INTAKE / OUTPUT: Intake/Output      12/15 0701 - 12/16 0700 12/16 0701 - 12/17 0700   P.O. 1440    I.V. (mL/kg)     IV Piggyback 164 154   Total Intake(mL/kg) 1604 (26.6) 154 (2.6)   Urine (mL/kg/hr) 2650 (1.8) 1200 (2.9)   Total Output 2650 1200   Net -1046 -1046           PHYSICAL EXAMINATION: General: No distress Neuro:  Calm, follows commands, moves all extremities, trouble with memory, speech improved HEENT:  No sinus tenderness Cardiovascular: s1s2 no murmur Lungs:  Scattered rhonchi Abdomen:  Soft, non tender, + bowel sounds Musculoskeletal:  No edema Skin: no rashes   LABS: Cbc  Lab 10/26/2012 0500 11/19/2012 0450 11/15/2012 0500  WBC 12.3* -- --  HGB 9.1* 8.4* 8.5*  HCT 27.6* 24.0* 24.7*  PLT 334 241 162    Chemistry   Lab 10/29/2012 0515 11/15/2012 0500 11/13/2012 0450 11/21/2012 1445  NA 151* 149* 146* --  K 3.7 3.4* 3.3* --  CL 112 111 109 --  CO2 33* 30 28 --  BUN 31* 33* 32* --  CREATININE 1.03 1.02 1.07 --  CALCIUM 10.5 10.2 10.0 --  MG -- -- -- 1.5  PHOS -- -- -- --  GLUCOSE 183* 122* 119* --    Liver fxn  Lab 11/21/2012 0200  AST 54*  ALT 93*  ALKPHOS 57  BILITOT 0.7  PROT 5.2*  ALBUMIN 2.2*   coags No  results found for this basename: APTT:3,INR:3 in the last 168 hours Sepsis markers No results found for this basename: LATICACIDVEN:3,PROCALCITON:3 in the last 168 hours Cardiac markers No results found for this basename: CKTOTAL:3,CKMB:3,TROPONINI:3 in the last 168 hours BNP No results found for this basename: PROBNP:3 in the last 168 hours ABG  Lab 11/18/2012 1012  PHART 7.424  PCO2ART 36.6  PO2ART 58.0*  HCO3 23.9  TCO2 25    CBG trend  Lab 10/25/2012 1201 11/20/2012 0810 10/29/2012 0023 10/29/2012 2021 11/21/2012 1129  GLUCAP 118* 99 105* 107* 75    IMAGING: Dg Chest Port 1 View  11/14/2012  *RADIOLOGY REPORT*  Clinical Data: Follow-up pneumonia,  chest heart failure  PORTABLE CHEST - 1 VIEW  Comparison: Chest radiograph 11/15/2012  Findings: Interval extubation and removal of right central venous line.  Stable heart silhouette.  This mild improvement in bibasilar atelectasis.  There is a fine perihilar air space disease and small effusions.  No pneumothorax.  IMPRESSION:  1.  Fine perihilar air space disease suggest pulmonary edema. 2.  Small bilateral pleural effusions.  3.  Some improvement in basilar atelectasis.   Original Report Authenticated By: Genevive Bi, M.D.      DIAGNOSES: Active Problems:  HYPERLIPIDEMIA  HYPERTENSION  CORONARY ARTERY DISEASE  CORONARY ARTERY BYPASS GRAFT, HX OF  Sudden cardiac death  Acute respiratory failure  Pneumonia   ASSESSMENT / PLAN:  PULMONARY  ASSESSMENT:  Acute respiratory failure 2nd to cardiac arrest with acute pulmonary edema, complicated by HCAP. Much improved.  PLAN:   BiPAP changed to prn but now on [place] and seems to be holding. F/u CXR intermittently. Adjust FiO2 to keep Spo2 > 92%. Would like to diurese but patient is going for a cath in AM and would like to keep hydrated to avoid contrast nephropathy.  CARDIOVASCULAR  ASSESSMENT:  Presumed VF cardiac arrest. Acute on chronic systolic, diastolic CHF. Hx of CAD, HTN, Hyperlipidemia.  PLAN:  Continue aspirin 81 mg qd. Change lopressor to 25 mg PO BID and keep IV as PRN. Appreciate input from cards, cath in AM.  RENAL  ASSESSMENT:   Hyponatremia. Likely from hypervolemia.  Resolved with diuresis now hypernatremic. Hypokalemia.  PLAN:   D/C lasix (keep hydrated for cath in AM). Gentle hydration with D5W. F/u and replace electrolytes as needed.  GASTROINTESTINAL  ASSESSMENT:   Elevated LFT's.   Likely from cardiac arrest/shock.  Improving. Hepatitis C Ab reactive from 11/16/2012>>Hep C RNA PCR negative 10/29/2012. Nutrition. Dysphagia.  PLAN:   Follow up with speech therapy 12/15 for swallow evaluation,  and then advance diet.  HEMATOLOGIC  ASSESSMENT:   Anemia critical illness. Mild thrombocytopenia.  PLAN:  F/u CBC intermittently  INFECTIOUS  ASSESSMENT:  ?UTI. Increased respiratory secretions, low grade temperature, persistent pulmonary infiltrates 12/11. Hx of PCN allergy. PLAN:   D9/10 Abx, D6/7  levaquin  D/c vancomycin 12/15  ENDOCRINE  ASSESSMENT:   Hyperglycemia 2nd to acute stress. No hx of DM>>CBG's much improved  PLAN:   D/c'ed SSI 12/14 Monitor blood sugars on BMET  NEUROLOGIC  ASSESSMENT:   Acute encephalopathy 2nd to cardiac arrest. Agitation much better with change to precedex 12/12.  Much improved mental status 12/13>>continues to improve 12/15  PLAN:   Monitor mental status No further sedation/precedex at this time.  Alyson Reedy, M.D. [facility] Pulmonary/Critical Care Medicine. Pager: [phone]. After hours pager: [phone].

## 2012-11-09 NOTE — Progress Notes (Signed)
I was called by physical therapy; the patient qualifies for inpatient rehabilitation.   Order for MD rehab consult placed in the chart.

## 2012-11-09 NOTE — Evaluation (Signed)
Physical Therapy Evaluation Patient Details Name: [name] Christensen MRN: [id number] DOB: [birth year] Today's Date: 10/29/2012 Time: 3474-2595 PT Time Calculation (min): 27 min  PT Assessment / Plan / Recommendation Clinical Impression  Pt admitted after cardiac arrest down 15 min prior to CPR, hypothermia protocol, and VDRF. Pt with poor activity tolerance and respiratory reserve at this time with sats dropping to 88% on 5L [place] and delayed recovery maintaining 89% even on 50% venturi mask at rest after seated. Pt will benefit from acute therapy to maximize activity tolerance, safety awareness and function to return pt to PLOF and decrease burden of care.     PT Assessment  Patient needs continued PT services    Follow Up Recommendations  CIR    Does the patient have the potential to tolerate intense rehabilitation      Barriers to Discharge Decreased caregiver support (24hr supervision from wife not physical assist)      Equipment Recommendations  Rolling walker with 5" wheels    Recommendations for Other Services Rehab consult   Frequency Min 3X/week    Precautions / Restrictions Precautions Precautions: Fall Precaution Comments: oxygen   Pertinent Vitals/Pain sats down to 88% on 5L [place] And slow recovery seated even on 50% venturi, 92% at rest RN aware HR highest 129 with transfer      Mobility  Bed Mobility Bed Mobility: Rolling Right;Right Sidelying to Sit;Sitting - Scoot to Edge of Bed Rolling Right: 4: Min assist;With rail Right Sidelying to Sit: 4: Min assist;HOB elevated;With rails (HOB 35degrees) Sitting - Scoot to Edge of Bed: 5: Supervision Details for Bed Mobility Assistance: max cueing for sequence with assist to coordinate reaching with LUE to rail to roll and assist to elevate trunk, cueing to scoot to EOB with increased time to complete Transfers Transfers: Sit to Stand;Stand to Sit;Stand Pivot Transfers Sit to Stand: 4: Min assist;From bed Stand to  Sit: To chair/3-in-1;4: Min assist Stand Pivot Transfers: 4: Min assist Details for Transfer Assistance: cueing for hand placement, sequence and posture as pt wanting to maintain flexion in standing Ambulation/Gait Ambulation/Gait Assistance: Not tested (comment)    Shoulder Instructions     Exercises     PT Diagnosis: Difficulty walking  PT Problem List: Decreased mobility;Decreased activity tolerance;Decreased safety awareness;Decreased knowledge of use of DME;Cardiopulmonary status limiting activity PT Treatment Interventions: Gait training;DME instruction;Therapeutic activities;Therapeutic exercise;Functional mobility training;Patient/family education   PT Goals Acute Rehab PT Goals PT Goal Formulation: With patient Time For Goal Achievement: 11/17/2012 Potential to Achieve Goals: Good Pt will go Supine/Side to Sit: with supervision;with HOB 0 degrees PT Goal: Supine/Side to Sit - Progress: Goal set today Pt will go Sit to Supine/Side: with supervision;with HOB 0 degrees PT Goal: Sit to Supine/Side - Progress: Goal set today Pt will go Sit to Stand: with supervision PT Goal: Sit to Stand - Progress: Goal set today Pt will go Stand to Sit: with supervision PT Goal: Stand to Sit - Progress: Goal set today Pt will Ambulate: >150 feet;with supervision PT Goal: Ambulate - Progress: Goal set today  Visit Information  Last PT Received On: 11/24/2012 Assistance Needed: +1 PT/OT Co-Evaluation/Treatment: Yes    Subjective Data  Subjective: I just feel weak Patient Stated Goal: return to work   Prior Functioning  Home Living Lives With: Spouse Available Help at Discharge: Family Type of Home: House Home Access: Stairs to enter Secretary/administrator of Steps: 3 Entrance Stairs-Rails: None Home Layout: Two level;Able to live on main level with  bedroom/bathroom Bathroom Shower/Tub: Tub/shower unit;Walk-in shower Bathroom Toilet: Handicapped height Home Adaptive Equipment:  None Prior Function Level of Independence: Independent Able to Take Stairs?: Yes Driving: Yes 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[employer]: No difficulties    Cognition  Overall Cognitive Status: Impaired Area of Impairment: Attention;Memory;Problem solving Arousal/Alertness: Awake/alert Orientation Level: Oriented X4 / Intact Behavior During Session: WFL for tasks performed Current Attention Level: Sustained Memory Deficits: Pt with difficulty recalling complete home environment Cognition - Other Comments: slow processing    Extremity/Trunk Assessment Right Lower Extremity Assessment RLE ROM/Strength/Tone: WFL for tasks assessed RLE Sensation: WFL - Light Touch Left Lower Extremity Assessment LLE ROM/Strength/Tone: WFL for tasks assessed LLE Sensation: WFL - Light Touch Trunk Assessment Trunk Assessment: Normal   Balance Static Sitting Balance Static Sitting - Balance Support: Feet supported;Bilateral upper extremity supported Static Sitting - Level of Assistance: 5: Stand by assistance Static Sitting - Comment/# of Minutes: 5  End of Session PT - End of Session Activity Tolerance: Patient limited by fatigue Patient left: in chair;with call bell/phone within reach Nurse Communication: Mobility status  GP     Delorse Lek 11/22/2012, 2:26 PM  Delaney Meigs, PT [phone]

## 2012-11-09 NOTE — Progress Notes (Signed)
Subjective:  Asked to reconsult on this patient who was admitted 10/28/2012 after witnessed arrest at home,presumedVF and CPR was started 10 minutes after arrest. Treated with arctic sun.  Wife states that he "woke up" 11/16/2012.  Initially by Dr. Harvie Bridge note no plans for cath. Cardiology had signed off.  However he now is alert and making good progress.  Today he complained of some soreness in right side of chest. He is tender to palpitation.   Objective:  Vital Signs in the last 24 hours: Temp:  [97 F (36.1 C)-99.1 F (37.3 C)] 97 F (36.1 C) (12/16 0800) Pulse Rate:  [81-112] 93  (12/16 0900) Resp:  [17-31] 22  (12/16 0900) BP: (142-176)/(67-82) 158/76 mmHg (12/16 0900) SpO2:  [83 %-98 %] 94 % (12/16 0900) FiO2 (%):  [50 %] 50 % (12/16 0400) Weight:  [132 lb 11.5 oz (60.2 kg)] 132 lb 11.5 oz (60.2 kg) (12/16 0500)  Intake/Output from previous day: 12/15 0701 - 12/16 0700 In: 1604 [P.O.:1440; IV Piggyback:164] Out: 2650 [Urine:2650] Intake/Output from this shift:       . antiseptic oral rinse  15 mL Mouth Rinse QID  . aspirin  81 mg Oral Daily  . furosemide  40 mg Intravenous Daily  . heparin subcutaneous  5,000 Units Subcutaneous Q8H  . levofloxacin (LEVAQUIN) IV  750 mg Intravenous Q24H  . magic mouthwash  2 mL Oral QID  . metoprolol  5 mg Intravenous Q6H  . pantoprazole (PROTONIX) IV  40 mg Intravenous QHS      Physical Exam: The patient appears to be in no distress. Lying flat, not dyspneic.  Head and neck exam reveals that the pupils are equal and reactive.  The extraocular movements are full.  There is no scleral icterus.  Mouth and pharynx are benign.  No lymphadenopathy.  No carotid bruits.  The jugular venous pressure is normal.  Thyroid is not enlarged or tender.  Chest reveals rales at left base.  Heart reveals no abnormal lift or heave.  First and second heart sounds are normal.  There is no murmur gallop rub or click.  The abdomen is soft and nontender.   Bowel sounds are normoactive.  There is no hepatosplenomegaly or mass.  There are no abdominal bruits.  Extremities reveal no phlebitis or edema.  Pedal pulses are good.  There is no cyanosis or clubbing.  Neurologic exam is normal strength and no lateralizing weakness.  No sensory deficits.  Integument reveals no rash  Lab Results:  Basename 2012-12-06 0500 10/26/2012 0450  WBC 12.3* 13.4*  HGB 9.1* 8.4*  PLT 334 241    Basename 11/16/2012 0515 Dec 06, 2012 0500  NA 151* 149*  K 3.7 3.4*  CL 112 111  CO2 33* 30  GLUCOSE 183* 122*  BUN 31* 33*  CREATININE 1.03 1.02   No results found for this basename: TROPONINI:2,CK,MB:2 in the last 72 hours Hepatic Function Panel No results found for this basename: PROT,ALBUMIN,AST,ALT,ALKPHOS,BILITOT,BILIDIR,IBILI in the last 72 hours No results found for this basename: CHOL in the last 72 hours No results found for this basename: PROTIME in the last 72 hours  Imaging: Imaging results have been reviewed. *RADIOLOGY REPORT*  Clinical Data: Follow-up pneumonia, chest heart failure  PORTABLE CHEST - 1 VIEW  Comparison: Chest radiograph 11/18/2012  Findings: Interval extubation and removal of right central venous  line. Stable heart silhouette. This mild improvement in bibasilar  atelectasis. There is a fine perihilar air space disease and small  effusions. No  pneumothorax.  IMPRESSION:  1. Fine perihilar air space disease suggest pulmonary edema.  2. Small bilateral pleural effusions.  3. Some improvement in basilar atelectasis.  Original Report Authenticated By: Genevive Bi, M.D.   Cardiac Studies: Telemetry reveals sinus tachycardia, occ PVCs. EKG shows sinus tachycardia, old RBBB with new rate-related ST abnormalities. Assessment/Plan:  Patient Active Hospital Problem List   CORONARY ARTERY DISEASE (09/01/2007)   Assessment: Recent 10/26/2012 VF arrest with successful resuscitation. Known CAD with remote CABG His chest pain today  appears to be chest wall pain in right chest related to CPR.    Plan:  Now that he has recovered neurologically he should have a cath before discharge. Will increase beta blocker today for better rate control. Continue ASA, Add statin. Was on plavix long term at home but will hold off on plavix until after cath. Will plan cath for Tuesday 10/27/2012     LOS: 9 days    Cassell Clement 11/22/2012, 11:09 AM

## 2012-11-09 NOTE — Progress Notes (Signed)
Rehab Admissions Coordinator Note:  Patient was screened by Brock Ra for appropriateness for an Inpatient Acute Rehab Consult.  At this time, we are recommending Inpatient Rehab consult.  Brock Ra 11/20/2012, 2:46 PM  I can be reached at [phone].

## 2012-11-09 NOTE — Progress Notes (Signed)
Pt developed some chest pain during bed bath this am.   States his chest is tender to touch.  ekg done and notified ccm doctor.   Ccm consulted with [facility].   Dr .  Patty Sermons in to see pt.

## 2012-11-09 NOTE — Progress Notes (Signed)
ANTIBIOTIC CONSULT NOTE - FOLLOW UP  Pharmacy Consult for Levaquin Indication: PNA/UTI  Allergies  Allergen Reactions  . Amoxicillin     REACTION: unspecified  . Ezetimibe     REACTION: numbness  . Niacin     REACTION: CP  . Penicillins   . Pravastatin Sodium   . Rosuvastatin     REACTION: aches    Patient Measurements: Height: 5\' 11"  (180.3 cm) Weight: 132 lb 11.5 oz (60.2 kg) IBW/kg (Calculated) : 75.3   Vital Signs: Temp: 97.6 F (36.4 C) (12/16 1219) Temp src: Oral (12/16 1219) BP: 148/71 mmHg (12/16 1219) Pulse Rate: 119  (12/16 1413) Intake/Output from previous day: 12/15 0701 - 12/16 0700 In: 1604 [P.O.:1440; IV Piggyback:164] Out: 2650 [Urine:2650] Intake/Output from this shift: Total I/O In: 154 [IV Piggyback:154] Out: 1200 [Urine:1200]  Labs:  Basename 10/28/2012 0515 11/16/2012 0500 11/03/2012 0450  WBC -- 12.3* 13.4*  HGB -- 9.1* 8.4*  PLT -- 334 241  [facility] -- -- --  CREATININE 1.03 1.02 1.07   Estimated Creatinine Clearance: 50.3 ml/min (by C-G formula based on Cr of 1.03).  Assessment: 80yom continues on levaquin day #6 (total antibiotic #9/10) for pneumonia/UTI. Renal function has remained stable.  Cipro 400 q12 12/8>>12/11 Vanc 750 q12 12/11>>12/15 Levaquin 750 q24 12/11 >>  12/15 Urine cx>>pending 12/11 Resp cx >> non-pathogenic flora 12/8 Blood cx x 2 >> negative MRSA - PCR +  Goal of Therapy:  Appropriate levaquin dosing  Plan:  1) Continue levaquin 750mg  IV q24 2) Follow up d/c tomorrow  Fredrik Rigger 11/21/2012,3:14 PM

## 2012-11-09 NOTE — Evaluation (Signed)
Occupational Therapy Evaluation Patient Details Name: [name] Christensen MRN: [id number] DOB: 1932-09-07 Today's Date: 10/26/2012 Time: 0630-1601 OT Time Calculation (min): 24 min  OT Assessment / Plan / Recommendation Clinical Impression  This 78 y.o. male admitted after VF arrest with CPR started ~15 mins after onset.  Pt. with decreased activity tolerance with sats decreasing to 86 on 4L. Very slow to recover and pt. placed on 50% venti mask.  Required 25 mins to recover into low 90s.  Pt. demonstrates the below listed deficits and will beneift from OT to maximize safety and independence with BADLs to allow pt. to return home with family and min A.  Recommend CIR    OT Assessment  Patient needs continued OT Services    Follow Up Recommendations  CIR;Supervision/Assistance - 24 hour    Barriers to Discharge None    Equipment Recommendations  3 in 1 bedside comode;None recommended by OT    Recommendations for Other Services Rehab consult  Frequency  Min 2X/week    Precautions / Restrictions Precautions Precautions: Fall Precaution Comments: oxygen Restrictions Weight Bearing Restrictions: No       ADL  Eating/Feeding: Supervision/safety Where Assessed - Eating/Feeding: Chair Grooming: Wash/dry hands;Wash/dry face;Brushing hair;Minimal assistance Where Assessed - Grooming: Supported sitting Upper Body Bathing: Moderate assistance Where Assessed - Upper Body Bathing: Supported sitting Lower Body Bathing: Moderate assistance Where Assessed - Lower Body Bathing: Supported sit to stand Upper Body Dressing: Moderate assistance Where Assessed - Upper Body Dressing: Supported sit to stand Lower Body Dressing: Maximal assistance Where Assessed - Lower Body Dressing: Supported sit to Pharmacist, hospital: Minimal Dentist Method: Surveyor, minerals: Materials engineer and Hygiene: Moderate assistance Where  Assessed - Toileting Clothing Manipulation and Hygiene: Standing Transfers/Ambulation Related to ADLs: Pt. transfers to chair with min A.  Unable to ambulate due to fatigue and decreased 02 sats ADL Comments: Pt. requires assist for BADLs due to cardio pulmonary status/decreased activity assistance.  Sats decreased to 86, and very slow to recover even with pursed lip breathing.  02 increased to 5L, and still satting 88%.  Venti mask placed on pt. at 50% FIO2 and sats 88-90%.  Recovered  to 93% after 20 mins on venti mask    OT Diagnosis: Generalized weakness;Cognitive deficits  OT Problem List: Decreased strength;Decreased activity tolerance;Impaired balance (sitting and/or standing);Decreased cognition;Decreased safety awareness;Decreased knowledge of use of DME or AE;Cardiopulmonary status limiting activity OT Treatment Interventions: Self-care/ADL training;Therapeutic exercise;DME and/or AE instruction;Therapeutic activities;Patient/family education;Balance training;Cognitive remediation/compensation   OT Goals Acute Rehab OT Goals OT Goal Formulation: With patient Time For Goal Achievement: 10/28/2012 Potential to Achieve Goals: Good ADL Goals Pt Will Perform Grooming: with supervision;Standing at sink ADL Goal: Grooming - Progress: Goal set today Pt Will Perform Upper Body Bathing: with supervision;Sitting, chair ADL Goal: Upper Body Bathing - Progress: Goal set today Pt Will Perform Lower Body Bathing: with supervision;Sit to stand from chair;Sit to stand from bed ADL Goal: Lower Body Bathing - Progress: Goal set today Pt Will Perform Upper Body Dressing: with supervision;Sitting, chair;Sitting, bed ADL Goal: Upper Body Dressing - Progress: Goal set today Pt Will Perform Lower Body Dressing: with supervision;Sit to stand from chair;Sit to stand from bed ADL Goal: Lower Body Dressing - Progress: Goal set today Pt Will Transfer to Toilet: with supervision;Ambulation;Comfort height  toilet ADL Goal: Toilet Transfer - Progress: Goal set today Pt Will Perform Toileting - Clothing Manipulation: with supervision;Standing ADL Goal: Toileting - Clothing  Manipulation - Progress: Goal set today Pt Will Perform Tub/Shower Transfer: Shower transfer;with supervision;Ambulation;with DME ADL Goal: Tub/Shower Transfer - Progress: Goal set today Additional ADL Goal #1: Pt. will participate in 25 mins therapeutic activity with no  more than 2 rest breaks and sats >93% ADL Goal: Additional Goal #1 - Progress: Goal set today  Visit Information  Last OT Received On: 10/29/2012 Assistance Needed: +1    Subjective Data  Subjective: "I get tired" Patient Stated Goal: to get better   Prior Functioning     Home Living Lives With: Spouse Available Help at Discharge: Family Type of Home: House Home Access: Stairs to enter [employer] of Steps: 3 Entrance Stairs-Rails: None Home Layout: Two level;Able to live on main level with bedroom/bathroom Bathroom Shower/Tub: Tub/shower unit;Walk-in shower Bathroom Toilet: Handicapped height Home Adaptive Equipment: None Prior Function Level of Independence: Independent Able to Take Stairs?: Yes Driving: Yes Vocation: Full time employment Comments: Pt. reports he works full time for [employer]: No difficulties Dominant Hand: Right         Vision/Perception     Cognition  Overall Cognitive Status: Impaired Area of Impairment: Attention;Memory;Following commands Arousal/Alertness: Awake/alert Orientation Level: Oriented X4 / Intact Behavior During Session: [facility] for tasks performed Current Attention Level: Sustained Attention - Other Comments: Pt. max A for selective attention Memory Deficits: Pt with difficulty recalling complete home environment Following Commands: Follows one step commands consistently Cognition - Other Comments: slow processing; Pt. required mod A to problem solve  how to move supine to sitting EOB    Extremity/Trunk Assessment Right Upper Extremity Assessment RUE ROM/Strength/Tone: Within functional levels RUE Coordination: WFL - gross/fine motor Left Upper Extremity Assessment LUE ROM/Strength/Tone: Within functional levels LUE Coordination: WFL - gross/fine motor Right Lower Extremity Assessment RLE ROM/Strength/Tone: WFL for tasks assessed RLE Sensation: WFL - Light Touch Left Lower Extremity Assessment LLE ROM/Strength/Tone: WFL for tasks assessed LLE Sensation: WFL - Light Touch Trunk Assessment Trunk Assessment: Normal     Mobility Bed Mobility Bed Mobility: Rolling Right;Right Sidelying to Sit;Sitting - Scoot to Edge of Bed Rolling Right: 4: Min assist;With rail Right Sidelying to Sit: 4: Min assist;HOB elevated;With rails (HOB 35degrees) Sitting - Scoot to Edge of Bed: 5: Supervision Details for Bed Mobility Assistance: max cueing for sequence with assist to coordinate reaching with LUE to rail to roll and assist to elevate trunk, cueing to scoot to EOB with increased time to complete Transfers Sit to Stand: 4: Min assist;From bed Stand to Sit: To chair/3-in-1;4: Min assist Details for Transfer Assistance: cueing for hand placement, sequence and posture as pt wanting to maintain flexion in standing     Shoulder Instructions     Exercise     Balance Static Sitting Balance Static Sitting - Balance Support: Feet supported;Bilateral upper extremity supported Static Sitting - Level of Assistance: 5: Stand by assistance Static Sitting - Comment/# of Minutes: 5   End of Session OT - End of Session Activity Tolerance: Patient limited by fatigue Patient left: in chair;with call bell/phone within reach Nurse Communication: Mobility status;Other (comment) (02 sats)  GO     Tayten Heber, Ursula Alert M 11/18/2012, 2:37 PM

## 2012-11-09 NOTE — Progress Notes (Signed)
NUTRITION FOLLOW UP  Intervention:   1. Ensure Pudding po TID, each supplement provides 170 kcal and 4 grams of protein.  2. RD will continue to follow    Nutrition Dx:   Inadequate oral intake now related to dislike of foods provided as evidenced by 30% meal completions  Goal:   PO intake to meet >/=90% estimated nutrition needs, unmet  Monitor:   PO intake, weight trends, labs  Assessment:   Pt was extubated on 12/13. Followed by SLP for dysphagia post extubation. Diet advanced to D1-nectar 12/15.  Meal completions is 30% x1 meal.  Pt states he is unable to "eat all this mushed up foods" Willing to try Ensure Pudding. Also encouraged pt to have family bring in appropriate foods for pt.   Height: Ht Readings from Last 1 Encounters:  10/26/2012 5\' 11"  (1.803 m)    Weight Status:   Wt Readings from Last 1 Encounters:  11/22/2012 132 lb 11.5 oz (60.2 kg)    Re-estimated needs:  Kcal: 1500-1700 Protein: 75-90 gm  Fluid: 1.5-1.7 L/day    Skin: intact  Diet Order: Dysphagia 1, nectar thick liquids    Intake/Output Summary (Last 24 hours) at 11/06/2012 1540 Last data filed at 11/23/2012 1112  Gross per 24 hour  Intake    648 ml  Output   3400 ml  Net  -2752 ml    Last BM: 12/16   Labs:   Lab 11/14/2012 0515 10/26/2012 0500 10/30/2012 0450  NA 151* 149* 146*  K 3.7 3.4* 3.3*  CL 112 111 109  CO2 33* 30 28  BUN 31* 33* 32*  CREATININE 1.03 1.02 1.07  CALCIUM 10.5 10.2 10.0  MG -- -- --  PHOS -- -- --  GLUCOSE 183* 122* 119*   CBG (last 3)   Basename 11/22/2012 1201 11/24/2012 0810 11/11/2012 0023  GLUCAP 118* 99 105*    Scheduled Meds:   . antiseptic oral rinse  15 mL Mouth Rinse QID  . aspirin  81 mg Oral Daily  . atorvastatin  40 mg Oral q1800  . heparin subcutaneous  5,000 Units Subcutaneous Q8H  . levofloxacin (LEVAQUIN) IV  750 mg Intravenous Q24H  . magic mouthwash  2 mL Oral QID  . metoprolol tartrate  25 mg Oral BID  . pantoprazole (PROTONIX) IV  40 mg  Intravenous QHS   Continuous Infusions:   . dextrose         Clarene Duke RD, LDN Pager [phone] After Hours pager [phone]

## 2012-11-09 NOTE — Progress Notes (Signed)
Speech Language Pathology Dysphagia Treatment Patient Details Name: [name] Christensen MRN: [id number] DOB: [birth year] Today's Date: 11/23/2012 Time: 4098-1191 SLP Time Calculation (min): 16 min  Assessment / Plan / Recommendation Clinical Impression  Treatment focused on safety with recommended diet texture and liquids.  Observed pt. with graham cracker without overt difficulty.  Nectar thick liquids consumed without s/s aspiration.  Pt. required minimal verbal cues for swallow precautions and clinical rationale for thickener.  Recommend continue Dys 3 diet texture and nectar thick liquids.  SLP will follow with plans for reassessment with thin liquids at bedside.  Pt. may have cath tomorrow.      Diet Recommendation  Continue with Current Diet: Dysphagia 3 (mechanical soft);Nectar-thick liquid    SLP Plan Continue with current plan of care   Pertinent Vitals/Pain    Swallowing Goals  SLP Swallowing Goals Patient will consume recommended diet without observed clinical signs of aspiration with: Supervision/safety Swallow Study Goal #1 - Progress: Progressing toward goal Patient will utilize recommended strategies during swallow to increase swallowing safety with: Supervision/safety Swallow Study Goal #2 - Progress: Progressing toward goal  General Temperature Spikes Noted: No Respiratory Status: Supplemental O2 delivered via (comment) Behavior/Cognition: Alert;Cooperative;Pleasant mood Oral Cavity - Dentition: Adequate natural dentition Patient Positioning: Upright in bed  Oral Cavity - Oral Hygiene Does patient have any of the following "at risk" factors?: Oxygen therapy - cannula, mask, simple oxygen devices Brush patient's teeth BID with toothbrush (using toothpaste with fluoride): Yes Patient is HIGH RISK - Oral Care Protocol followed (see row info): Yes Patient is AT RISK - Oral Care Protocol followed (see row info): Yes   Dysphagia Treatment Treatment focused on: Skilled  observation of diet tolerance Treatment Methods/Modalities: Skilled observation Patient observed directly with PO's: Yes Type of PO's observed: Dysphagia 3 (soft);Nectar-thick liquids Feeding: Able to feed self Liquids provided via: Cup;No straw Type of cueing: Verbal Amount of cueing: Minimal        Royce Macadamia M.Ed [employer] [phone]  11/17/2012

## 2012-11-10 ENCOUNTER — Encounter (HOSPITAL_COMMUNITY)
Admission: AD | Disposition: A | Discharge status: Rehabilitation Facility | Payer: Self-pay | Source: Other Acute Inpatient Hospital | Attending: Pulmonary Disease

## 2012-11-10 ENCOUNTER — Inpatient Hospital Stay (HOSPITAL_COMMUNITY): Payer: Medicare Other

## 2012-11-10 DIAGNOSIS — M79609 Pain in unspecified limb: Secondary | ICD-10-CM

## 2012-11-10 DIAGNOSIS — I2510 Atherosclerotic heart disease of native coronary artery without angina pectoris: Secondary | ICD-10-CM

## 2012-11-10 HISTORY — PX: LEFT HEART CATHETERIZATION WITH CORONARY/GRAFT ANGIOGRAM: SHX5450

## 2012-11-10 LAB — CBC
HCT: 30 % — ABNORMAL LOW (ref 39.0–52.0)
HCT: 30.6 % — ABNORMAL LOW (ref 39.0–52.0)
Hemoglobin: 9.8 g/dL — ABNORMAL LOW (ref 13.0–17.0)
Hemoglobin: 10.1 g/dL — ABNORMAL LOW (ref 13.0–17.0)
MCH: 29.3 pg (ref 26.0–34.0)
MCH: 29.6 pg (ref 26.0–34.0)
MCHC: 32.7 g/dL (ref 30.0–36.0)
MCHC: 33 g/dL (ref 30.0–36.0)
MCV: 89.8 fL (ref 78.0–100.0)
MCV: 89.7 fL (ref 78.0–100.0)
Platelets: 519 10*3/uL — ABNORMAL HIGH (ref 150–400)
Platelets: 531 10*3/uL — ABNORMAL HIGH (ref 150–400)
RBC: 3.34 MIL/uL — ABNORMAL LOW (ref 4.22–5.81)
RBC: 3.41 MIL/uL — ABNORMAL LOW (ref 4.22–5.81)
RDW: 14.2 % (ref 11.5–15.5)
RDW: 14.2 % (ref 11.5–15.5)
WBC: 16.6 10*3/uL — ABNORMAL HIGH (ref 4.0–10.5)
WBC: 16 10*3/uL — ABNORMAL HIGH (ref 4.0–10.5)

## 2012-11-10 LAB — MAGNESIUM: Magnesium: 2.1 mg/dL (ref 1.5–2.5)

## 2012-11-10 LAB — URINALYSIS, ROUTINE W REFLEX MICROSCOPIC
Bilirubin Urine: NEGATIVE
Glucose, UA: NEGATIVE mg/dL
Ketones, ur: NEGATIVE mg/dL
Leukocytes, UA: NEGATIVE
Nitrite: NEGATIVE
Protein, ur: 30 mg/dL — AB
Specific Gravity, Urine: 1.037 — ABNORMAL HIGH (ref 1.005–1.030)
Urobilinogen, UA: 1 mg/dL (ref 0.0–1.0)
pH: 5.5 (ref 5.0–8.0)

## 2012-11-10 LAB — BASIC METABOLIC PANEL
BUN: 25 mg/dL — ABNORMAL HIGH (ref 6–23)
CO2: 34 mEq/L — ABNORMAL HIGH (ref 19–32)
Calcium: 10.3 mg/dL (ref 8.4–10.5)
Chloride: 102 mEq/L (ref 96–112)
Creatinine, Ser: 1 mg/dL (ref 0.50–1.35)
GFR calc Af Amer: 81 mL/min — ABNORMAL LOW (ref >90)
GFR calc non Af Amer: 70 mL/min — ABNORMAL LOW (ref >90)
Glucose, Bld: 136 mg/dL — ABNORMAL HIGH (ref 70–99)
Potassium: 3.1 mEq/L — ABNORMAL LOW (ref 3.5–5.1)
Sodium: 144 mEq/L (ref 135–145)

## 2012-11-10 LAB — CBC WITH DIFFERENTIAL/PLATELET
Basophils Absolute: 0 10*3/uL (ref 0.0–0.1)
Basophils Relative: 0 % (ref 0–1)
Eosinophils Absolute: 1 10*3/uL — ABNORMAL HIGH (ref 0.0–0.7)
Eosinophils Relative: 6 % — ABNORMAL HIGH (ref 0–5)
HCT: 30.8 % — ABNORMAL LOW (ref 39.0–52.0)
Hemoglobin: 10 g/dL — ABNORMAL LOW (ref 13.0–17.0)
Lymphocytes Relative: 14 % (ref 12–46)
Lymphs Abs: 2.3 10*3/uL (ref 0.7–4.0)
MCH: 29.2 pg (ref 26.0–34.0)
MCHC: 32.5 g/dL (ref 30.0–36.0)
MCV: 89.8 fL (ref 78.0–100.0)
Monocytes Absolute: 0.6 10*3/uL (ref 0.1–1.0)
Monocytes Relative: 4 % (ref 3–12)
Neutro Abs: 12.2 10*3/uL — ABNORMAL HIGH (ref 1.7–7.7)
Neutrophils Relative %: 76 % (ref 43–77)
Platelets: 505 10*3/uL — ABNORMAL HIGH (ref 150–400)
RBC: 3.43 MIL/uL — ABNORMAL LOW (ref 4.22–5.81)
RDW: 14.4 % (ref 11.5–15.5)
WBC: 16.1 10*3/uL — ABNORMAL HIGH (ref 4.0–10.5)

## 2012-11-10 LAB — PHOSPHORUS: Phosphorus: 2.7 mg/dL (ref 2.3–4.6)

## 2012-11-10 LAB — CREATININE, SERUM
Creatinine, Ser: 0.9 mg/dL (ref 0.50–1.35)
GFR calc Af Amer: >90 mL/min (ref >90)
GFR calc non Af Amer: 79 mL/min — ABNORMAL LOW (ref >90)

## 2012-11-10 LAB — URINE MICROSCOPIC-ADD ON

## 2012-11-10 LAB — PROTIME-INR
INR: 1.32 (ref 0.00–1.49)
Prothrombin Time: 16.1 seconds — ABNORMAL HIGH (ref 11.6–15.2)

## 2012-11-10 SURGERY — LEFT HEART CATHETERIZATION WITH CORONARY/GRAFT ANGIOGRAM
Anesthesia: LOCAL

## 2012-11-10 MED ORDER — SODIUM CHLORIDE 0.9 % IR SOLN
80.0000 mg | Status: DC
  Filled 2012-11-10 (×2): qty 2

## 2012-11-10 MED ORDER — CHLORHEXIDINE GLUCONATE 4 % EX LIQD
Freq: Once | CUTANEOUS | Status: AC
  Administered 2012-11-11: 4 via TOPICAL
  Filled 2012-11-10: qty 15

## 2012-11-10 MED ORDER — POTASSIUM CHLORIDE CRYS ER 20 MEQ PO TBCR
40.0000 meq | EXTENDED_RELEASE_TABLET | Freq: Once | ORAL | Status: AC
  Administered 2012-11-10: 40 meq via ORAL
  Filled 2012-11-10: qty 2

## 2012-11-10 MED ORDER — SODIUM CHLORIDE 0.45 % IV SOLN
INTRAVENOUS | Status: DC
  Administered 2012-11-11: 04:00:00 via INTRAVENOUS

## 2012-11-10 MED ORDER — CHLORHEXIDINE GLUCONATE 4 % EX LIQD
60.0000 mL | Freq: Once | CUTANEOUS | Status: AC
  Administered 2012-11-10: 4 via TOPICAL

## 2012-11-10 MED ORDER — HEPARIN SODIUM (PORCINE) 5000 UNIT/ML IJ SOLN
5000.0000 [IU] | Freq: Three times a day (TID) | INTRAMUSCULAR | Status: DC
  Administered 2012-11-10 – 2012-11-12 (×5): 5000 [IU] via SUBCUTANEOUS
  Filled 2012-11-10 (×9): qty 1

## 2012-11-10 MED ORDER — SODIUM CHLORIDE 0.9 % IJ SOLN: 3.0000 mL | INTRAMUSCULAR | Status: DC | PRN

## 2012-11-10 MED ORDER — NITROGLYCERIN 0.2 MG/ML ON CALL CATH LAB
INTRAVENOUS | Status: AC
  Filled 2012-11-10: qty 1

## 2012-11-10 MED ORDER — HEPARIN (PORCINE) IN NACL 2-0.9 UNIT/ML-% IJ SOLN
INTRAMUSCULAR | Status: AC
  Filled 2012-11-10: qty 1000

## 2012-11-10 MED ORDER — CHLORHEXIDINE GLUCONATE 4 % EX LIQD
60.0000 mL | Freq: Once | CUTANEOUS | Status: AC
  Administered 2012-11-11: 4 via TOPICAL
  Filled 2012-11-10: qty 60
  Filled 2012-11-10: qty 45

## 2012-11-10 MED ORDER — LIDOCAINE HCL (PF) 1 % IJ SOLN
INTRAMUSCULAR | Status: AC
  Filled 2012-11-10: qty 30

## 2012-11-10 MED ORDER — SODIUM CHLORIDE 0.45 % IV SOLN
INTRAVENOUS | Status: AC
  Administered 2012-11-10: 12:00:00 via INTRAVENOUS

## 2012-11-10 MED ORDER — SODIUM CHLORIDE 0.9 % IV SOLN: 250.0000 mL | INTRAVENOUS | Status: DC

## 2012-11-10 MED ORDER — ACETAMINOPHEN 325 MG PO TABS: 650.0000 mg | ORAL_TABLET | Freq: Four times a day (QID) | ORAL | Status: DC | PRN

## 2012-11-10 MED ORDER — VANCOMYCIN HCL IN DEXTROSE 1-5 GM/200ML-% IV SOLN
1000.0000 mg | INTRAVENOUS | Status: DC
  Filled 2012-11-10: qty 200

## 2012-11-10 MED ORDER — SODIUM CHLORIDE 0.9 % IJ SOLN: 3.0000 mL | Freq: Two times a day (BID) | INTRAMUSCULAR | Status: DC

## 2012-11-10 NOTE — Progress Notes (Signed)
Following pt for possible CIR.  What is plan/schedule for placement of defibrillator? [phone]

## 2012-11-10 NOTE — Progress Notes (Signed)
Speech Language Pathology Treatment Patient Details Name: KRISS ISHLER MRN: [id number] DOB: 1932-10-16 Today's Date: 11/18/2012 Time:  -    Unable to see for dysphagia treatment due to NPO for procedure today.  SLP uncertain what time procedure is and will most likely see pt. tomorrow.  If procedure is early and pt. Is alert, please page this SLP.  Breck Coons [place].Ed [employer] [phone]  11/02/2012

## 2012-11-10 NOTE — Progress Notes (Signed)
Physical medicine and rehabilitation consult has been requested. Cardiology notes plan for cardiac catheterization today. Will followup with formal rehabilitation consult after procedure completed.

## 2012-11-10 NOTE — Progress Notes (Signed)
Accompanied patient with transport to cath lab for procedure. Report given to Onalee Hua in cath lab: Patient received aspirin this morning (0600 am), potassium 40 meq (0800), and metoprolol 25 mg (immediately prior to transport to cath lab). Reported patient had 60 cc of intake this morning at 0800 and a sip with metoprolol.

## 2012-11-10 NOTE — Progress Notes (Signed)
VASCULAR LAB PRELIMINARY  PRELIMINARY  PRELIMINARY  PRELIMINARY  Right groin duplex completed.    Preliminary report:  No evidence of pseudoaneurysm or AV fistula.  Patient's pain is concentrated at his right side.  No evidence of ascites or bleed or hematoma at that area.  Jheri Mitter, RVT 11/23/2012, 3:56 PM

## 2012-11-10 NOTE — Progress Notes (Signed)
Physical Therapy Treatment Patient Details Name: [name] Christensen MRN: [id number] DOB: [birth year] Today's Date: 11/20/2012 Time: 6213-0865 PT Time Calculation (min): 28 min  PT Assessment / Plan / Recommendation Comments on Treatment Session  Pt admitted s/p cardiac arrest, delayed CPR initiation, hypothermia and VDRF. Pt progressing with mobility today. Put pt on 50% venturi mask prior to initiating mobility to maintain sats with sats 97-100% maintained throughout with brief drop to 95%, HR 98-110 with mobility. Pt able to tolerate short ambulation and some HEP today without significant change in vitals. Will continue to follow. Pt returned to bed for cath as they arrived early.     Follow Up Recommendations        Does the patient have the potential to tolerate intense rehabilitation     Barriers to Discharge        Equipment Recommendations       Recommendations for Other Services    Frequency     Plan Discharge plan remains appropriate;Frequency remains appropriate    Precautions / Restrictions Precautions Precautions: Fall Precaution Comments: oxygen   Pertinent Vitals/Pain No pain     Mobility  Bed Mobility Bed Mobility: Rolling Right;Right Sidelying to Sit;Sit to Supine;Scooting to [facility] Rolling Right: 4: Min guard;With rail Right Sidelying to Sit: HOB flat;With rails;4: Min assist Sitting - Scoot to Edge of Bed: 6: Modified independent (Device/Increase time) Sit to Supine: 4: Min assist;HOB flat Scooting to HOB: 4: Min guard Details for Bed Mobility Assistance: cueing for step by step sequencing Transfers Transfers: Sit to Stand;Stand to Sit Sit to Stand: 4: Min assist;From bed;From chair/3-in-1 Stand to Sit: To chair/3-in-1;To bed;4: Min guard Details for Transfer Assistance: cueing for hand placement and safety Ambulation/Gait Ambulation/Gait Assistance: 4: Min assist Ambulation Distance (Feet): 10 Feet (8', 10') Assistive device: 1 person hand held  assist Ambulation/Gait Assistance Details: cueing for safety and posture, pt with tendency to grab for anything nearby  Gait Pattern: Step-through pattern;Decreased stride length;Narrow base of support Gait velocity: decreased Stairs: No    Exercises General Exercises - Lower Extremity Long Arc Quad: AROM;Both;20 reps;Seated Hip Flexion/Marching: AROM;Both;20 reps;Seated   PT Diagnosis:    PT Problem List:   PT Treatment Interventions:     PT Goals Acute Rehab PT Goals PT Goal: Supine/Side to Sit - Progress: Progressing toward goal PT Goal: Sit to Supine/Side - Progress: Progressing toward goal PT Goal: Sit to Stand - Progress: Progressing toward goal PT Goal: Stand to Sit - Progress: Progressing toward goal PT Goal: Ambulate - Progress: Progressing toward goal  Visit Information  Last PT Received On: 10/31/2012 Assistance Needed: +1    Subjective Data  Subjective: "they told me I wasn't going til 1:30" referring to cath lab   Cognition  Overall Cognitive Status: Impaired Arousal/Alertness: Awake/alert Orientation Level: Appears intact for tasks assessed Behavior During Session: [facility] for tasks performed Current Attention Level: Selective Memory Deficits: difficulty recalling home phone number Cognition - Other Comments: slow processing    Balance     End of Session PT - End of Session Activity Tolerance: Patient tolerated treatment well Patient left: in bed;with call bell/phone within reach;with nursing in room Nurse Communication: Mobility status   GP     Toney Sang [facility] 10/28/2012, 9:41 AM Delaney Meigs, PT [phone]

## 2012-11-10 NOTE — Progress Notes (Signed)
eLink Physician-Brief Progress Note Patient Name: [name] Christensen DOB: [birth year] MRN: [id number]  Date of Service  11/08/2012   HPI/Events of Note  Hypokalemia   eICU Interventions  Potassium replaced   Intervention Category Minor Interventions: Electrolytes abnormality - evaluation and management  Damira Kem 11/23/2012, 6:48 AM

## 2012-11-10 NOTE — Progress Notes (Signed)
PULMONARY  / CRITICAL CARE MEDICINE  Name: [name] Christensen MRN: [id number] DOB: January 15, 1932    LOS: 10  CHIEF COMPLAINT:  Cardiac Arrest  BRIEF PATIENT DESCRIPTION:  78 yo male admitted to [facility] on 11/14/2012 with cardiac arrest, VDRF with subsequent transfer to [facility] for hypothermia protocol.  Significant PMHx of CAD s/p CABG, HTN, Hyperlipidemia, GERD, BPH  LINES / TUBES: ETT 12/7 >>>12/13 Rt IJ CVL 12/7>>> Rt radial aline 12/7>>>12/10  CULTURES: MRSA screen 12/07>>POSITIVE Blood 12/08>>negative Sputum 12/11>>oral flora  ANTIBIOTICS: Cipro 12/08>>12/11 Vancomycin 12/11>>12/15 Levaquin 12/11>>   EVENTS:  12/7 Out of hospital arrest, estimated 15 minutes down time 12/8- slight brady with cooling, low dose levo needs 12/9 Rewarmed 12/10 Off pressors 12/11 Increased respiratory secretions, low grade temperature 12/12 Changed to precedex 12/13 Improved mental status  TESTS: 12/07 CT head >> Mild atrophy, chronic small vessel white matter ischemic changes.  Chronic bilateral ethmoid, frontal and sphenoid sinusitis. 12/08 Echo >> EF 40 to 45%, grade 2 diastolic dysfx 12/12 EEG >> severe slowing of cerebral activity 12/12 MRI Brain >>mild/mod small vessel disease, global atrophy.  Opacification mastoid air cells and middle ear cavity bilaterally.  Paranasal sinus opacification   LEVEL OF CARE:  SDU PRIMARY SERVICE:  PCCM CONSULTANTS:  [facility] (signed off 12/11) CODE STATUS: Full DIET:  Tube feeds DVT Px:  Heparin SQ GI Px:  protonix   INTERVAL HISTORY:  More alert.  Decreased O2 needs.  Denies chest pain.  VITAL SIGNS: Temp:  [97 F (36.1 C)-98.9 F (37.2 C)] 97 F (36.1 C) (12/17 1153) Pulse Rate:  [79-119] 79  (12/17 1130) Resp:  [18-27] 18  (12/17 1130) BP: (131-160)/(68-79) 143/70 mmHg (12/17 1130) SpO2:  [88 %-100 %] 97 % (12/17 1130) FiO2 (%):  [50 %] 50 % (12/17 0935) Weight:  [62.8 kg (138 lb 7.2 oz)] 62.8 kg (138 lb 7.2 oz) (12/17  0500) HEMODYNAMICS:   VENTILATOR SETTINGS: Vent Mode:  [-]  FiO2 (%):  [50 %] 50 % INTAKE / OUTPUT: Intake/Output      12/16 0701 - 12/17 0700 12/17 0701 - 12/18 0700   P.O. 240 60   I.V. (mL/kg) 637.5 (10.2)    IV Piggyback 154    Total Intake(mL/kg) 1031.5 (16.4) 60 (1)   Urine (mL/kg/hr) 2195 (1.5)    Total Output 2195    Net -1163.5 +60           PHYSICAL EXAMINATION: General: No distress Neuro:  Calm, follows commands, moves all extremities, trouble with memory, speech improved HEENT:  No sinus tenderness Cardiovascular: s1s2 no murmur Lungs:  Scattered rhonchi Abdomen:  Soft, non tender, + bowel sounds Musculoskeletal:  No edema Skin: no rashes   LABS: Cbc  Lab 11/18/2012 0450 11/24/2012 0500 11/21/2012 0450  WBC 16.1* -- --  HGB 10.0* 9.1* 8.4*  HCT 30.8* 27.6* 24.0*  PLT 505* 334 241    Chemistry   Lab 11/13/2012 0450 11/21/2012 0515 11/22/2012 0500  NA 144 151* 149*  K 3.1* 3.7 3.4*  CL 102 112 111  CO2 34* 33* 30  BUN 25* 31* 33*  CREATININE 1.00 1.03 1.02  CALCIUM 10.3 10.5 10.2  MG 2.1 -- --  PHOS 2.7 -- --  GLUCOSE 136* 183* 122*    Liver fxn No results found for this basename: AST:3,ALT:3,ALKPHOS:3,BILITOT:3,PROT:3,ALBUMIN:3 in the last 168 hours coags  Lab 10/28/2012 0450  APTT --  INR 1.32   Sepsis markers No results found for this basename: LATICACIDVEN:3,PROCALCITON:3 in the last 168  hours Cardiac markers No results found for this basename: CKTOTAL:3,CKMB:3,TROPONINI:3 in the last 168 hours BNP No results found for this basename: PROBNP:3 in the last 168 hours ABG No results found for this basename: PHART:3,PCO2ART:3,PO2ART:3,HCO3:3,TCO2:3 in the last 168 hours  CBG trend  Lab 11/20/2012 1201 11/19/2012 0810 10/25/2012 0023 10/27/2012 2021 11/13/2012 1129  GLUCAP 118* 99 105* 107* 75    IMAGING: Dg Chest Port 1 View  11/19/2012  *RADIOLOGY REPORT*  Clinical Data: Pulmonary edema.  PORTABLE CHEST - 1 VIEW  Comparison: 11/24/2012.  Findings:  The cardiac silhouette, mediastinal and hilar contours are stable.  Persistent interstitial airspace process.  No definite pleural effusions or pneumothorax.  IMPRESSION: Persistent interstitial and airspace process.   Original Report Authenticated By: Rudie Meyer, M.D.      DIAGNOSES: Active Problems:  HYPERLIPIDEMIA  HYPERTENSION  CORONARY ARTERY DISEASE  CORONARY ARTERY BYPASS GRAFT, HX OF  Sudden cardiac death  Acute respiratory failure  Pneumonia   ASSESSMENT / PLAN:  PULMONARY  ASSESSMENT:  Acute respiratory failure 2nd to cardiac arrest with acute pulmonary edema, complicated by HCAP. Much improved.  PLAN:   D/C BiPAP. F/u CXR intermittently. Adjust FiO2 to keep Spo2 > 92%. Continue hydration now, will d/c and keep dry after hydration post cath.  CARDIOVASCULAR  ASSESSMENT:  Presumed VF cardiac arrest. Acute on chronic systolic, diastolic CHF. Hx of CAD, HTN, Hyperlipidemia. Cath results noted.  Will likely need implantable defib. PLAN:  Continue aspirin 81 mg qd. Changed lopressor to 25 mg PO BID and keep IV as PRN. Appreciate input from cards, cath in AM.  RENAL  ASSESSMENT:   Hyponatremia. Likely from hypervolemia.  Resolved with diuresis now hypernatremic improved with hydration. Hypokalemia.  PLAN:   D/C lasix (keep hydrated for cath in AM). Gentle hydration with D5W and 1/2NS as ordered by cards. F/u and replace electrolytes as needed.  GASTROINTESTINAL  ASSESSMENT:   Elevated LFT's.   Likely from cardiac arrest/shock.  Improving. Hepatitis C Ab reactive from 11/15/2012>>Hep C RNA PCR negative 11/16/2012. Nutrition. Dysphagia.  PLAN:   Follow up with speech therapy 12/15 for swallow evaluation, and then advance diet.  HEMATOLOGIC  ASSESSMENT:   Anemia critical illness. Mild thrombocytopenia.  PLAN:  F/u CBC intermittently  INFECTIOUS  ASSESSMENT:  ?UTI. Increased respiratory secretions, low grade temperature, persistent pulmonary  infiltrates 12/11. Hx of PCN allergy. PLAN:   D10/10 Abx, D7/7  levaquin will d/c. D/c vancomycin 12/15  ENDOCRINE  ASSESSMENT:   Hyperglycemia 2nd to acute stress. No hx of DM>>CBG's much improved  PLAN:   D/c'ed SSI 12/14 Monitor blood sugars on BMET  NEUROLOGIC  ASSESSMENT:   Acute encephalopathy 2nd to cardiac arrest. Agitation much better with change to precedex 12/12.  Much improved mental status 12/13>>continues to improve 12/15  PLAN:   Monitor mental status No further sedation/precedex at this time.  Alyson Reedy, M.D. [facility] Pulmonary/Critical Care Medicine. Pager: [phone]. After hours pager: [phone].

## 2012-11-10 NOTE — CV Procedure (Signed)
Cardiac Catheterization Procedure Note  Name: [name] Christensen MRN: [id number] DOB: [birth year]  Procedure: Left Heart Cath, Selective Coronary Angiography,Saphenous vein graft angiography, LIMA graft angiography, LV angiography  Indication: 78 year old white male status post coronary bypass surgery presents with an out of hospital cardiac arrest.   Procedural details: The right groin was prepped, draped, and anesthetized with 1% lidocaine. Using modified Seldinger technique, a 5 French sheath was introduced into the right femoral artery. Standard Judkins catheters were used for coronary angiography and left ventriculography. Catheter exchanges were performed over a guidewire. There were no immediate procedural complications. The patient was transferred to the post catheterization recovery area for further monitoring.  Procedural Findings: Hemodynamics:  AO 172/72 with a mean of 113 mmHg LV 177/7 mmHg   Coronary angiography: Coronary dominance: right  Left mainstem: The left main coronary is calcified. It has no significant disease.  Left anterior descending (LAD): The LAD is moderately calcified in the proximal and mid vessel. The mid vessel there is a segmental 40-50% stenosis. There is a small diagonal branch which is occluded.  Left circumflex (LCx): The left circumflex gives rise to 3 marginal branches. There is 30% stenosis in the proximal circumflex. The first obtuse marginal vessel is occluded. In the second marginal branch there is a long stented segment with 50-60% in-stent stenosis. The distal OM has 70% stenosis.  Right coronary artery (RCA): The right coronary is heavily calcified throughout. It is severely diseased at the ostium and in the proximal vessel up to 90%. It is occluded in the mid vessel.  The saphenous vein graft to the RCA is widely patent with good runoff.  The saphenous vein graft to the diagonal branch is also widely patent. It has several valves within it  with mild 30% disease diffusely.  Saphenous vein graft sequentially to the first and second obtuse marginal vessels his widely patent. It also has several valves noted without significant disease.  The LIMA graft to the LAD is small and diffusely atretic. It appears severely diseased in the distal graft.  Left ventriculography: Left ventricular systolic function is normal, LVEF is estimated at 55-65%, there is no significant mitral regurgitation   Final Conclusions:   1. Severe three-vessel obstructive coronary disease. 2. All vein grafts are widely patent including saphenous vein graft to the diagonal, saphenous vein graft sequentially to the first and second obtuse marginal vessels, and saphenous vein graft to the RCA. 3. The atretic LIMA graft to the LAD. However the native LAD has no significant obstructive disease. This is unchanged from prior cardiac catheterization 2007. 4. Normal left ventricular function.  Recommendations: Continue medical management of his coronary disease. We will consider the patient for ICD.  Theron Arista [facility] 11/16/2012, 10:29 AM

## 2012-11-10 NOTE — PMR Pre-admission (Signed)
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 Date: A & B 09/26/1999     Deduct: 0    Out of Pocket Max: $1184.00      Life Max: 0 CIR: 100%       SNF: days 1-20 100%; per day 21-100 $148.00 Outpatient: 80%     Co-Pay: 20% Home Health: 100%      Co-Pay: 20% equipment used DME: 80%      Co-Pay: 20%  Providers: pt choice SECONDARY: [employer]      Policy#: [id number]      Subscriber: pt     MC D: A[employer] [facility] Rx  Emergency Contact Information Contact Information    Name Relation Home Work Mobile   Goldman,Betty Other [phone]  [phone]     Current Medical History  Patient Admitting Diagnosis: Deconditioning as well as mild anoxic encephalopathy  History of Present Illness: 78 y.o. male with history of CAD who was admitted on 10/30/2012 witnessed arrest at home,presumedVF and CPR was started 10 minutes after cardiac arrest. Treated with arctic sun and started on IV heparin. Intubated for acute resiratory failure due to cardiac arrest and pulmonary edema. CT head negative for acuate changes. 2D echo with EF 40-45% with severe hypokinesis of basal to mid posterior and anterolateral walls. Treated with IV antibiotics for HCAP. Patient with decreased level of responsiveness and EEG of 12/13 showed severe slowing of cerebral activity c/w Severe encephalopathy. He tolerated extubation 12/13 and D3 diet with nectar liquids recommended by ST. Cardiac cath today with widely patent graft.  ICD placement 11/17/2012. PT/OT evaluations done  and  patient deconditioned and requiring 50% venturi mask to maintain adequate oxygenation with activity. He is also noted to have impaired mentation with slow processing.         Past Medical History  Past Medical History  Diagnosis Date  . Coronary artery disease     s/p coronary bypass grafting 2005  . GERD (gastroesophageal reflux disease)   . Hyperlipidemia   . Hypertension   . BPH (benign prostatic hypertrophy)     elevated PSA Dr. Lindell Noe Bx 2010    Family History  family history includes Coronary artery disease in his mother.  Prior Rehab/Hospitalizations: no   Current Medications  Current facility-administered medications:0.9 %  sodium chloride infusion, , Intravenous, PRN, Coralyn Helling, MD;  acetaminophen (TYLENOL) tablet 325-650 mg, 325-650 mg, Oral, Q4H PRN, Duke Salvia, MD;  aspirin chewable tablet 81 mg, 81 mg, Oral, Daily, Coralyn Helling, MD, 81 mg at 11/21/2012 1053;  atorvastatin (LIPITOR) tablet 40 mg, 40 mg, Oral, q1800, Cassell Clement, MD, 40 mg at 11/21/2012 1729 Chlorhexidine Gluconate Cloth 2 % PADS 6 each, 6 each, Topical, Q0600, Vineet  Craige Cotta, MD, 6 each at 11/30/2012 0636;  feeding supplement (ENSURE) pudding 1 Container, 1 Container, Oral, TID BM, Tonye Becket, RD, 1 Container at 05-Dec-2012 1053;  heparin injection 5,000 Units, 5,000 Units, Subcutaneous, Q8H, [name], MD, 5,000 Units at December 05, 2012 0636;  magic mouthwash, 2 mL, Oral, QID, Oretha Milch, MD, 2 mL at 11/25/2012 0437 metoprolol (LOPRESSOR) tablet 50 mg, 50 mg, Oral, BID, Duke Salvia, MD, 50 mg at 12/10/2012 1053;  mupirocin ointment (BACTROBAN) 2 % 1 application, 1 application, Nasal, BID, Coralyn Helling, MD, 1 application at 12/22/2012 1053;  ondansetron (ZOFRAN) injection 4 mg, 4 mg, Intravenous, Q6H PRN, Duke Salvia, MD;  pantoprazole (PROTONIX) injection 40 mg, 40 mg, Intravenous, QHS, Coralyn Helling, MD, 40 mg at 11/15/2012 2148 RESOURCE THICKENUP CLEAR, , Oral, PRN, Coralyn Helling, MD  Patients Current  Diet: Dysphagia  Precautions / Restrictions Precautions Precautions: Fall Precaution Comments: oxygen Restrictions Weight Bearing Restrictions: Yes LUE Weight Bearing:  (minimize movement)   Prior Activity Level Community (5-7x/wk): yes Home Assistive Devices / Equipment Home Assistive Devices/Equipment: None Home Adaptive Equipment: None  Prior Functional Level Prior Function Level of Independence: Independent Able to Take Stairs?: Yes Driving: Yes Vocation: Full time employment Comments: Pt. reports he works full time for [employer]  Current Functional Level Cognition  Arousal/Alertness: Awake/alert Overall Cognitive Status: Impaired Current Attention Level: Selective Attention - Other Comments: Pt. max A for selective attention Memory Deficits: Pt repeats self frequently Orientation Level: Oriented X4 Following Commands: Follows one step commands consistently Safety/Judgement: Decreased safety judgement for tasks assessed Safety/Judgement - Other Comments: Tends to reach out an hold to objects causing decreased balance. Cognition - Other Comments: slow processing    Extremity Assessment (includes Sensation/Coordination)  RUE ROM/Strength/Tone: Within functional levels RUE Coordination: WFL - gross/fine motor  RLE ROM/Strength/Tone: WFL for tasks assessed RLE Sensation: WFL - Light Touch    ADLs  Eating/Feeding: Set up;Supervision/safety Where Assessed - Eating/Feeding: Chair Grooming: Wash/dry hands;Brushing hair;Minimal assistance Where Assessed - Grooming: Supported standing Upper Body Bathing: Moderate assistance Where Assessed - Upper Body Bathing: Supported sitting Lower Body Bathing: Moderate assistance Where Assessed - Lower Body Bathing: Supported sit to stand Upper Body Dressing: Moderate assistance Where Assessed - Upper Body Dressing: Supported sit to stand Lower Body Dressing: Moderate assistance Where Assessed - Lower Body Dressing:  Supported sit to Pharmacist, hospital: Minimal assistance Toilet Transfer Method: Sit to stand;Stand pivot Acupuncturist: Bedside commode Toileting - Clothing Manipulation and Hygiene: Minimal assistance Where Assessed - Toileting Clothing Manipulation and Hygiene: Standing Transfers/Ambulation Related to ADLs: Ambulates with min A ADL Comments: Pt. on 4L 02 via [place].  Participated in 8 mins therapeutic activity and ambulation with sats decreasing to 88% on 4L.  Pt vascilated between 87-90 on 4L, 02 increased to 6L, but still vascilating.  Veti mask placed at 50% FIO2 and pt recovered to 95% after another 5 mins.  Pt. then able to particiapte in another ~8 mins therapeutic activity/ambulation with sats 94%.  RN made aware, and dtr present    Mobility  Bed Mobility: Supine to Sit;Sitting - Scoot to Edge of Bed Rolling Right: 5: Supervision;With rail Right Sidelying to Sit: HOB flat;With rails;4: Min assist Sitting - Scoot to Edge of Bed: 5: Supervision Sit to Supine: 4: Min assist;HOB flat Scooting to HOB: 4: Min guard    Transfers  Transfers: Sit to Stand;Stand to Sit Sit to Stand: 4: Min assist;From bed;From chair/3-in-1;With upper extremity assist Stand to  Sit: To chair/3-in-1;To bed;With upper extremity assist Stand Pivot Transfers: 4: Min assist    Ambulation / Gait / Stairs / Wheelchair Mobility  Ambulation/Gait Ambulation/Gait Assistance: 4: Min Environmental consultant (Feet): 10 Feet (8', 10') Assistive device: 1 person hand held assist Ambulation/Gait Assistance Details: cueing for safety and posture, pt with tendency to grab for anything nearby  Gait Pattern: Step-through pattern;Decreased stride length;Narrow base of support Gait velocity: decreased Stairs: No    Posture / Balance Static Sitting Balance Static Sitting - Balance Support: Feet supported;Bilateral upper extremity supported Static Sitting - Level of Assistance: 5: Stand by assistance Static  Sitting - Comment/# of Minutes: 5 Static Standing Balance Static Standing - Balance Support: Right upper extremity supported Static Standing - Level of Assistance: 4: Min assist Static Standing - Comment/# of Minutes: 8     Special needs/care consideration BiPAP/CPAP no CPM no Continuous Drip IV no Dialysis no         Days no Life Vest no Oxygen  yes Special Bed no Trach Size no Wound Vac (area) no      Location no Skin small incision site            Location R. groin Bowel mgmt:per shift assessment:  incontinent soft BM 10/28/2012; continent BM 10/26/2012 Bladder mgmt: foley d/c'd 12/18-no problems noted Diabetic mgmt no Isolation: MRSA    Previous Home Environment Living Arrangements: Spouse/significant other Lives With: Spouse Available Help at Discharge: Family Type of Home: House Home Layout: Two level;Able to live on main level with bedroom/bathroom Home Access: Stairs to enter Entrance Stairs-Rails: None (wife plans to have rail placed) Entrance Stairs-Number of Steps: 4 Bathroom Shower/Tub: Tub/shower unit;Walk-in shower Bathroom Toilet: Handicapped height Home Care Services: No  Discharge Living Setting Plans for Discharge Living Setting: Patient's home Do you have any problems obtaining your medications?: No  Social/Family/Support Systems Patient Roles: Spouse;Parent;Other (Comment) (sibling) Contact Information: [phone] Anticipated Caregiver: wife Anticipated Caregiver's Contact Information: cell [phone] Ability/Limitations of Caregiver: S-light Min A Caregiver Availability: 24/7 Discharge Plan Discussed with Primary Caregiver: Yes Is Caregiver In Agreement with Plan?: Yes Does Caregiver/Family have Issues with Lodging/Transportation while Pt is in Rehab?: No    Goals/Additional Needs Patient/Family Goal for Rehab: Mod I-S Expected length of stay: @ 7-10 days Pt/Family Agrees to Admission and willing to participate: Yes Program Orientation  Provided & Reviewed with Pt/Caregiver Including Roles  & Responsibilities: Yes   Decrease burden of Care through IP rehab admission: Patient/family education, advance diet [currently D3 nectar thick liqs] and wean O2   Possible need for SNF placement upon discharge: no   Patient Condition: This patient's condition remains as documented in the Consult dated 10/30/2012, in which the Rehabilitation Physician determined and documented that the patient's condition is appropriate for intensive rehabilitative care in an inpatient rehabilitation facility.  Preadmission Screen Completed By:  Brock Ra, 11/15/2012 11:27 AM ______________________________________________________________________   Discussed status with Dr. Wynn Banker on 11/17/2012 at 11:28am and received telephone approval for admission today.  Admission Coordinator:  Brock Ra, time 11:28am/Date 11/13/2012

## 2012-11-10 NOTE — Interval H&P Note (Signed)
History and Physical Interval Note:  11/08/2012 9:52 AM  [name] Christensen  has presented today for surgery, with the diagnosis of Chest pain  The various methods of treatment have been discussed with the patient and family. After consideration of risks, benefits and other options for treatment, the patient has consented to  Procedure(s) (LRB) with comments: LEFT HEART CATHETERIZATION WITH CORONARY/GRAFT ANGIOGRAM (N/A) as a surgical intervention .  The patient's history has been reviewed, patient examined, no change in status, stable for surgery.  I have reviewed the patient's chart and labs.  Questions were answered to the patient's satisfaction.     Theron Arista [facility] 11/12/2012 9:52 AM

## 2012-11-10 NOTE — H&P (View-Only) (Signed)
Subjective:  Asked to reconsult on this patient who was admitted 10/28/2012 after witnessed arrest at home,presumedVF and CPR was started 10 minutes after arrest. Treated with arctic sun.  Wife states that he "woke up" 11/16/2012.  Initially by Dr. Harvie Bridge note no plans for cath. Cardiology had signed off.  However he now is alert and making good progress.  Today he complained of some soreness in right side of chest. He is tender to palpitation.   Objective:  Vital Signs in the last 24 hours: Temp:  [97 F (36.1 C)-99.1 F (37.3 C)] 97 F (36.1 C) (12/16 0800) Pulse Rate:  [81-112] 93  (12/16 0900) Resp:  [17-31] 22  (12/16 0900) BP: (142-176)/(67-82) 158/76 mmHg (12/16 0900) SpO2:  [83 %-98 %] 94 % (12/16 0900) FiO2 (%):  [50 %] 50 % (12/16 0400) Weight:  [132 lb 11.5 oz (60.2 kg)] 132 lb 11.5 oz (60.2 kg) (12/16 0500)  Intake/Output from previous day: 12/15 0701 - 12/16 0700 In: 1604 [P.O.:1440; IV Piggyback:164] Out: 2650 [Urine:2650] Intake/Output from this shift:       . antiseptic oral rinse  15 mL Mouth Rinse QID  . aspirin  81 mg Oral Daily  . furosemide  40 mg Intravenous Daily  . heparin subcutaneous  5,000 Units Subcutaneous Q8H  . levofloxacin (LEVAQUIN) IV  750 mg Intravenous Q24H  . magic mouthwash  2 mL Oral QID  . metoprolol  5 mg Intravenous Q6H  . pantoprazole (PROTONIX) IV  40 mg Intravenous QHS      Physical Exam: The patient appears to be in no distress. Lying flat, not dyspneic.  Head and neck exam reveals that the pupils are equal and reactive.  The extraocular movements are full.  There is no scleral icterus.  Mouth and pharynx are benign.  No lymphadenopathy.  No carotid bruits.  The jugular venous pressure is normal.  Thyroid is not enlarged or tender.  Chest reveals rales at left base.  Heart reveals no abnormal lift or heave.  First and second heart sounds are normal.  There is no murmur gallop rub or click.  The abdomen is soft and nontender.   Bowel sounds are normoactive.  There is no hepatosplenomegaly or mass.  There are no abdominal bruits.  Extremities reveal no phlebitis or edema.  Pedal pulses are good.  There is no cyanosis or clubbing.  Neurologic exam is normal strength and no lateralizing weakness.  No sensory deficits.  Integument reveals no rash  Lab Results:  Basename 2012-12-06 0500 10/26/2012 0450  WBC 12.3* 13.4*  HGB 9.1* 8.4*  PLT 334 241    Basename 11/16/2012 0515 Dec 06, 2012 0500  NA 151* 149*  K 3.7 3.4*  CL 112 111  CO2 33* 30  GLUCOSE 183* 122*  BUN 31* 33*  CREATININE 1.03 1.02   No results found for this basename: TROPONINI:2,CK,MB:2 in the last 72 hours Hepatic Function Panel No results found for this basename: PROT,ALBUMIN,AST,ALT,ALKPHOS,BILITOT,BILIDIR,IBILI in the last 72 hours No results found for this basename: CHOL in the last 72 hours No results found for this basename: PROTIME in the last 72 hours  Imaging: Imaging results have been reviewed. *RADIOLOGY REPORT*  Clinical Data: Follow-up pneumonia, chest heart failure  PORTABLE CHEST - 1 VIEW  Comparison: Chest radiograph 11/18/2012  Findings: Interval extubation and removal of right central venous  line. Stable heart silhouette. This mild improvement in bibasilar  atelectasis. There is a fine perihilar air space disease and small  effusions. No  pneumothorax.  IMPRESSION:  1. Fine perihilar air space disease suggest pulmonary edema.  2. Small bilateral pleural effusions.  3. Some improvement in basilar atelectasis.  Original Report Authenticated By: Genevive Bi, M.D.   Cardiac Studies: Telemetry reveals sinus tachycardia, occ PVCs. EKG shows sinus tachycardia, old RBBB with new rate-related ST abnormalities. Assessment/Plan:  Patient Active Hospital Problem List   CORONARY ARTERY DISEASE (09/01/2007)   Assessment: Recent 10/26/2012 VF arrest with successful resuscitation. Known CAD with remote CABG His chest pain today  appears to be chest wall pain in right chest related to CPR.    Plan:  Now that he has recovered neurologically he should have a cath before discharge. Will increase beta blocker today for better rate control. Continue ASA, Add statin. Was on plavix long term at home but will hold off on plavix until after cath. Will plan cath for Tuesday 10/27/2012     LOS: 9 days    Cassell Clement 11/22/2012, 11:09 AM

## 2012-11-10 NOTE — Progress Notes (Signed)
Patient with complaint of right flank pain 5/10. Notified [place], [place]. She will come and assess patient. Will continue to monitor patient.

## 2012-11-10 NOTE — Progress Notes (Signed)
R flank pain; s/p cath   CBC, R groin [place] pending, UA, Urine culture pending

## 2012-11-10 NOTE — Consult Note (Signed)
Physical Medicine and Rehabilitation Consult Reason for Consult: Deconditioning Referring Physician:  Dr. Frederico Hamman   HPI: [name] Christensen is a 78 y.o. male with history of CAD who was admitted on 11/12/2012 witnessed arrest at home,presumedVF and CPR was started 10 minutes after cardiac arrest. Treated with arctic sun and started on IV heparin. Intubated for acute resiratory failure due to cardiac arrest and pulmonary edema. CT head negative for acuate changes. 2D echo with EF 40-45% with severe hypokinesis of basal to mid posterior and anterolateral walls. Treated with IV antibiotics for HCAP. Patient with decreased level of responsiveness and EEG of 12/13 showed severe slowing of cerebral activity c/w  Severe encephalopathy. He tolerated extubation 12/13 and  D3 diet with nectar liquids recommended by ST. Cardiac cath today with widely patent graft. For ICD placement tomorrow PT/OT evaluations done yesterday and patient deconditioned and requiring 50% venturi mask to maintain adequate oxygenation with activity. He is also noted to have impaired mentation with slow processing. MD, PT, OT, ST recommending CIR. .   OT notes that the major limiting factor is oxygen desaturation Mental status appears to be clearing rapidly  Review of Systems  HENT: Negative for hearing loss.   Eyes: Negative for blurred vision and double vision.  Respiratory: Negative for cough and shortness of breath.   Cardiovascular: Negative for chest pain and palpitations.  Gastrointestinal: Negative for heartburn and abdominal pain.  Musculoskeletal: Negative for back pain and joint pain.       RLQ pain past cath  Neurological: Positive for weakness. Negative for headaches.   Past Medical History  Diagnosis Date  . Coronary artery disease     s/p coronary bypass grafting 2005  . GERD (gastroesophageal reflux disease)   . Hyperlipidemia   . Hypertension   . BPH (benign prostatic hypertrophy)     elevated PSA Dr. Lindell Noe Bx  2010   Past Surgical History  Procedure Date  . Cardiac catheterization 2007    with patent graft anatomy atretic left internal mammary  artery to the LAD which is nonobstructive. Will restart study June 08, 2007  . Coronary artery bypass graft 2005  . Lumbar fusion 09/2007   Family History  Problem Relation Age of Onset  . Coronary artery disease Other     male 1st degree relative<60   Social History: Married. 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 He  reports that he has never smoked. He has never used smokeless tobacco. His alcohol and drug histories not on file.  Allergies  Allergen Reactions  . Amoxicillin     REACTION: unspecified  . Ezetimibe     REACTION: numbness  . Niacin     REACTION: CP  . Penicillins   . Pravastatin Sodium   . Rosuvastatin     REACTION: aches   Medications Prior to Admission  Medication Sig Dispense Refill  . acetaminophen (TYLENOL) 325 MG tablet Take 650 mg by mouth every 6 (six) hours as needed. For pain      . alfuzosin (UROXATRAL) 10 MG 24 hr tablet Take 10 mg by mouth daily. Hold when taking flomax       . aspirin 81 MG tablet Take 81 mg by mouth daily.        [name] atenolol (TENORMIN) 50 MG tablet Take 1 tablet (50 mg total) by mouth daily.  90 tablet  3  . atorvastatin (LIPITOR) 40 MG tablet Take  Physical Medicine and Rehabilitation Consult Reason for Consult: Deconditioning Referring Physician:  Dr. Frederico Hamman   HPI: [name] Christensen is a 78 y.o. male with history of CAD who was admitted on 11/12/2012 witnessed arrest at home,presumedVF and CPR was started 10 minutes after cardiac arrest. Treated with arctic sun and started on IV heparin. Intubated for acute resiratory failure due to cardiac arrest and pulmonary edema. CT head negative for acuate changes. 2D echo with EF 40-45% with severe hypokinesis of basal to mid posterior and anterolateral walls. Treated with IV antibiotics for HCAP. Patient with decreased level of responsiveness and EEG of 12/13 showed severe slowing of cerebral activity c/w  Severe encephalopathy. He tolerated extubation 12/13 and  D3 diet with nectar liquids recommended by ST. Cardiac cath today with widely patent graft. For ICD placement tomorrow PT/OT evaluations done yesterday and patient deconditioned and requiring 50% venturi mask to maintain adequate oxygenation with activity. He is also noted to have impaired mentation with slow processing. MD, PT, OT, ST recommending CIR. .   OT notes that the major limiting factor is oxygen desaturation Mental status appears to be clearing rapidly  Review of Systems  HENT: Negative for hearing loss.   Eyes: Negative for blurred vision and double vision.  Respiratory: Negative for cough and shortness of breath.   Cardiovascular: Negative for chest pain and palpitations.  Gastrointestinal: Negative for heartburn and abdominal pain.  Musculoskeletal: Negative for back pain and joint pain.       RLQ pain past cath  Neurological: Positive for weakness. Negative for headaches.   Past Medical History  Diagnosis Date  . Coronary artery disease     s/p coronary bypass grafting 2005  . GERD (gastroesophageal reflux disease)   . Hyperlipidemia   . Hypertension   . BPH (benign prostatic hypertrophy)     elevated PSA Dr. Lindell Noe Bx  2010   Past Surgical History  Procedure Date  . Cardiac catheterization 2007    with patent graft anatomy atretic left internal mammary  artery to the LAD which is nonobstructive. Will restart study June 08, 2007  . Coronary artery bypass graft 2005  . Lumbar fusion 09/2007   Family History  Problem Relation Age of Onset  . Coronary artery disease Other     male 1st degree relative<60   Social History: Married. 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 He  reports that he has never smoked. He has never used smokeless tobacco. His alcohol and drug histories not on file.  Allergies  Allergen Reactions  . Amoxicillin     REACTION: unspecified  . Ezetimibe     REACTION: numbness  . Niacin     REACTION: CP  . Penicillins   . Pravastatin Sodium   . Rosuvastatin     REACTION: aches   Medications Prior to Admission  Medication Sig Dispense Refill  . acetaminophen (TYLENOL) 325 MG tablet Take 650 mg by mouth every 6 (six) hours as needed. For pain      . alfuzosin (UROXATRAL) 10 MG 24 hr tablet Take 10 mg by mouth daily. Hold when taking flomax       . aspirin 81 MG tablet Take 81 mg by mouth daily.        [name] atenolol (TENORMIN) 50 MG tablet Take 1 tablet (50 mg total) by mouth daily.  90 tablet  3  . atorvastatin (LIPITOR) 40 MG tablet Take  10. Anticipated D/C setting: Home 11. Anticipated post D/C treatments: HH therapy 12. Overall Rehab/Functional Prognosis: good  RECOMMENDATIONS: This patient's condition is appropriate for continued rehabilitative care in the following setting: CIR Patient has agreed to participate in recommended program. Yes Note that insurance prior authorization may be required for reimbursement for recommended care.  Comment:    11/07/2012  Physical Medicine and Rehabilitation Consult Reason for Consult: Deconditioning Referring Physician:  Dr. Frederico Hamman   HPI: [name] Christensen is a 78 y.o. male with history of CAD who was admitted on 11/12/2012 witnessed arrest at home,presumedVF and CPR was started 10 minutes after cardiac arrest. Treated with arctic sun and started on IV heparin. Intubated for acute resiratory failure due to cardiac arrest and pulmonary edema. CT head negative for acuate changes. 2D echo with EF 40-45% with severe hypokinesis of basal to mid posterior and anterolateral walls. Treated with IV antibiotics for HCAP. Patient with decreased level of responsiveness and EEG of 12/13 showed severe slowing of cerebral activity c/w  Severe encephalopathy. He tolerated extubation 12/13 and  D3 diet with nectar liquids recommended by ST. Cardiac cath today with widely patent graft. For ICD placement tomorrow PT/OT evaluations done yesterday and patient deconditioned and requiring 50% venturi mask to maintain adequate oxygenation with activity. He is also noted to have impaired mentation with slow processing. MD, PT, OT, ST recommending CIR. .   OT notes that the major limiting factor is oxygen desaturation Mental status appears to be clearing rapidly  Review of Systems  HENT: Negative for hearing loss.   Eyes: Negative for blurred vision and double vision.  Respiratory: Negative for cough and shortness of breath.   Cardiovascular: Negative for chest pain and palpitations.  Gastrointestinal: Negative for heartburn and abdominal pain.  Musculoskeletal: Negative for back pain and joint pain.       RLQ pain past cath  Neurological: Positive for weakness. Negative for headaches.   Past Medical History  Diagnosis Date  . Coronary artery disease     s/p coronary bypass grafting 2005  . GERD (gastroesophageal reflux disease)   . Hyperlipidemia   . Hypertension   . BPH (benign prostatic hypertrophy)     elevated PSA Dr. Lindell Noe Bx  2010   Past Surgical History  Procedure Date  . Cardiac catheterization 2007    with patent graft anatomy atretic left internal mammary  artery to the LAD which is nonobstructive. Will restart study June 08, 2007  . Coronary artery bypass graft 2005  . Lumbar fusion 09/2007   Family History  Problem Relation Age of Onset  . Coronary artery disease Other     male 1st degree relative<60   Social History: Married. 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 He  reports that he has never smoked. He has never used smokeless tobacco. His alcohol and drug histories not on file.  Allergies  Allergen Reactions  . Amoxicillin     REACTION: unspecified  . Ezetimibe     REACTION: numbness  . Niacin     REACTION: CP  . Penicillins   . Pravastatin Sodium   . Rosuvastatin     REACTION: aches   Medications Prior to Admission  Medication Sig Dispense Refill  . acetaminophen (TYLENOL) 325 MG tablet Take 650 mg by mouth every 6 (six) hours as needed. For pain      . alfuzosin (UROXATRAL) 10 MG 24 hr tablet Take 10 mg by mouth daily. Hold when taking flomax       . aspirin 81 MG tablet Take 81 mg by mouth daily.        [name] atenolol (TENORMIN) 50 MG tablet Take 1 tablet (50 mg total) by mouth daily.  90 tablet  3  . atorvastatin (LIPITOR) 40 MG tablet Take  Physical Medicine and Rehabilitation Consult Reason for Consult: Deconditioning Referring Physician:  Dr. Frederico Hamman   HPI: [name] Christensen is a 78 y.o. male with history of CAD who was admitted on 11/12/2012 witnessed arrest at home,presumedVF and CPR was started 10 minutes after cardiac arrest. Treated with arctic sun and started on IV heparin. Intubated for acute resiratory failure due to cardiac arrest and pulmonary edema. CT head negative for acuate changes. 2D echo with EF 40-45% with severe hypokinesis of basal to mid posterior and anterolateral walls. Treated with IV antibiotics for HCAP. Patient with decreased level of responsiveness and EEG of 12/13 showed severe slowing of cerebral activity c/w  Severe encephalopathy. He tolerated extubation 12/13 and  D3 diet with nectar liquids recommended by ST. Cardiac cath today with widely patent graft. For ICD placement tomorrow PT/OT evaluations done yesterday and patient deconditioned and requiring 50% venturi mask to maintain adequate oxygenation with activity. He is also noted to have impaired mentation with slow processing. MD, PT, OT, ST recommending CIR. .   OT notes that the major limiting factor is oxygen desaturation Mental status appears to be clearing rapidly  Review of Systems  HENT: Negative for hearing loss.   Eyes: Negative for blurred vision and double vision.  Respiratory: Negative for cough and shortness of breath.   Cardiovascular: Negative for chest pain and palpitations.  Gastrointestinal: Negative for heartburn and abdominal pain.  Musculoskeletal: Negative for back pain and joint pain.       RLQ pain past cath  Neurological: Positive for weakness. Negative for headaches.   Past Medical History  Diagnosis Date  . Coronary artery disease     s/p coronary bypass grafting 2005  . GERD (gastroesophageal reflux disease)   . Hyperlipidemia   . Hypertension   . BPH (benign prostatic hypertrophy)     elevated PSA Dr. Lindell Noe Bx  2010   Past Surgical History  Procedure Date  . Cardiac catheterization 2007    with patent graft anatomy atretic left internal mammary  artery to the LAD which is nonobstructive. Will restart study June 08, 2007  . Coronary artery bypass graft 2005  . Lumbar fusion 09/2007   Family History  Problem Relation Age of Onset  . Coronary artery disease Other     male 1st degree relative<60   Social History: Married. 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 He  reports that he has never smoked. He has never used smokeless tobacco. His alcohol and drug histories not on file.  Allergies  Allergen Reactions  . Amoxicillin     REACTION: unspecified  . Ezetimibe     REACTION: numbness  . Niacin     REACTION: CP  . Penicillins   . Pravastatin Sodium   . Rosuvastatin     REACTION: aches   Medications Prior to Admission  Medication Sig Dispense Refill  . acetaminophen (TYLENOL) 325 MG tablet Take 650 mg by mouth every 6 (six) hours as needed. For pain      . alfuzosin (UROXATRAL) 10 MG 24 hr tablet Take 10 mg by mouth daily. Hold when taking flomax       . aspirin 81 MG tablet Take 81 mg by mouth daily.        [name] atenolol (TENORMIN) 50 MG tablet Take 1 tablet (50 mg total) by mouth daily.  90 tablet  3  . atorvastatin (LIPITOR) 40 MG tablet Take

## 2012-11-10 NOTE — Progress Notes (Signed)
[name] Date of Encounter: 11/19/2012, 1:46 PM Admit date: 11/20/2012     Subjective  Mr. Goza reports intermittent sharp shooting R flank pain since his cath this AM. He denies CP, SOB.    Objective  Physical Exam: Vitals: BP 143/70  Pulse 79  Temp 97 F (36.1 C) (Oral)  Resp 18  Ht 5\' 11"  (1.803 m)  Wt 138 lb 7.2 oz (62.8 kg)  BMI 19.31 kg/m2  SpO2 97% General: Well developed, thin 78 year old male in no acute distress. Neck: Supple. JVD not elevated. Lungs: Shallow inspiration but clear bilaterally to auscultation without wheezes, rales, or rhonchi. Breathing is unlabored. Heart: RRR S1 S2 without murmurs, rub or gallop.  Abdomen: Soft, non-distended. Extremities: No clubbing or cyanosis. No edema.  Distal pedal pulses are 2+ and equal bilaterally. Right groin appears intact with pressure dressing in place. There is no bleeding, hematoma or ecchymosis.  Neuro: Alert and oriented X 3. Moves all extremities spontaneously. No focal deficits.  Intake/Output:  Intake/Output Summary (Last 24 hours) at 11/24/2012 1346 Last data filed at 10/28/2012 0802  Gross per 24 hour  Intake  937.5 ml  Output    995 ml  Net  -57.5 ml    Inpatient Medications:     . antiseptic oral rinse  15 mL Mouth Rinse QID  . aspirin  81 mg Oral Daily  . atorvastatin  40 mg Oral q1800  . feeding supplement  1 Container Oral TID BM  . heparin  5,000 Units Subcutaneous Q8H  . magic mouthwash  2 mL Oral QID  . metoprolol tartrate  25 mg Oral BID  . pantoprazole (PROTONIX) IV  40 mg Intravenous QHS      . sodium chloride 75 mL/hr at 11/21/2012 1222  . dextrose 50 mL/hr at 11/22/2012 0700    Labs:  [facility] 11/20/2012 0450 11/18/2012 0515  NA 144 151*  K 3.1* 3.7  CL 102 112  CO2 34* 33*  GLUCOSE 136* 183*  BUN 25* 31*  CREATININE 1.00 1.03  CALCIUM 10.3 10.5  MG 2.1 --  PHOS 2.7 --    Basename 11/14/2012 1303 11/20/2012 0450  WBC 16.6* 16.1*  NEUTROABS -- 12.2*  HGB 9.8*  10.0*  HCT 30.0* 30.8*  MCV 89.8 89.8  PLT 519* 505*    Basename 11/15/2012 0450  INR 1.32    Radiology/Studies: Dg Chest Port 1 View  10/25/2012  *RADIOLOGY REPORT*  Clinical Data: Pulmonary edema.  PORTABLE CHEST - 1 VIEW  Comparison: 10/27/2012.  Findings: The cardiac silhouette, mediastinal and hilar contours are stable.  Persistent interstitial airspace process.  No definite pleural effusions or pneumothorax.  IMPRESSION: Persistent interstitial and airspace process.   Original Report Authenticated By: Rudie Meyer, M.D.    Dg Chest Port 1 View  11/18/2012  *RADIOLOGY REPORT*  Clinical Data: Follow-up pneumonia, chest heart failure  PORTABLE CHEST - 1 VIEW  Comparison: Chest radiograph 11/21/2012  Findings: Interval extubation and removal of right central venous line.  Stable heart silhouette.  This mild improvement in bibasilar atelectasis.  There is a fine perihilar air space disease and small effusions.  No pneumothorax.  IMPRESSION:  1.  Fine perihilar air space disease suggest pulmonary edema. 2.  Small bilateral pleural effusions.  3.  Some improvement in basilar atelectasis.   Original Report Authenticated By: Genevive Bi, M.D.    Left cardiac catheterization with grafts today Procedural Findings:  Hemodynamics:  AO 172/72 with a mean  of 113 mmHg  LV 177/7 mmHg  Coronary angiography:  Coronary dominance: right  Left mainstem: The left main coronary is calcified. It has no significant disease.  Left anterior descending (LAD): The LAD is moderately calcified in the proximal and mid vessel. The mid vessel there is a segmental 40-50% stenosis. There is a small diagonal branch which is occluded.  Left circumflex (LCx): The left circumflex gives rise to 3 marginal branches. There is 30% stenosis in the proximal circumflex. The first obtuse marginal vessel is occluded. In the second marginal branch there is a long stented segment with 50-60% in-stent stenosis. The distal OM has 70%  stenosis.  Right coronary artery (RCA): The right coronary is heavily calcified throughout. It is severely diseased at the ostium and in the proximal vessel up to 90%. It is occluded in the mid vessel.  The saphenous vein graft to the RCA is widely patent with good runoff.  The saphenous vein graft to the diagonal branch is also widely patent. It has several valves within it with mild 30% disease diffusely.  Saphenous vein graft sequentially to the first and second obtuse marginal vessels his widely patent. It also has several valves noted without significant disease.  The LIMA graft to the LAD is small and diffusely atretic. It appears severely diseased in the distal graft.  Left ventriculography: Left ventricular systolic function is normal, LVEF is estimated at 55-65%, there is no significant mitral regurgitation  Final Conclusions:  1. Severe three-vessel obstructive coronary disease.  2. All vein grafts are widely patent including saphenous vein graft to the diagonal, saphenous vein graft sequentially to the first and second obtuse marginal vessels, and saphenous vein graft to the RCA.  3. The atretic LIMA graft to the LAD. However the native LAD has no significant obstructive disease. This is unchanged from prior cardiac catheterization 2007.  4. Normal left ventricular function.  Recommendations: Continue medical management of his coronary disease. We will consider the patient for ICD.   12-lead ECG this AM: sinus rhythm with RBBB Telemetry: sinus rhythm currently; no VT/VF    Assessment and Plan  1. Presumed VF arrest 2. CAD s/p CABG - all grafts widely patent, atretic LIMA to LAD but native LAD has no obstructive dz and is unchanged from previous cath in 2007; continue medical management 3. Ischemic CM, EF 40-45% by echo on admission; normal LVEF by cath today 4. HTN 5. Dyslipidemia Mr. Olascoaga is scheduled for ICD implantation tomorrow, 10/27/2012. He reports he is familiar with  cardiac devices as his wife has a pacemaker. He states he understands the procedure, ICD indication and function. Risks, benefits and alternatives to ICD were discussed with Mr. Spiller today. These risks include, but are not limited to, bleeding, infection, pneumothorax, perforation, tamponade, vascular damage, renal failure, lead dislodgement, MI, stroke and death. Mr. Shorey expressed verbal understanding and wishes to proceed.  Signed, Matisha Termine PA-C

## 2012-11-11 ENCOUNTER — Encounter: Payer: Self-pay | Admitting: Internal Medicine

## 2012-11-11 ENCOUNTER — Encounter (HOSPITAL_COMMUNITY)
Admission: AD | Disposition: A | Discharge status: Rehabilitation Facility | Payer: Self-pay | Source: Other Acute Inpatient Hospital | Attending: Pulmonary Disease

## 2012-11-11 DIAGNOSIS — R5381 Other malaise: Secondary | ICD-10-CM

## 2012-11-11 DIAGNOSIS — G931 Anoxic brain damage, not elsewhere classified: Secondary | ICD-10-CM

## 2012-11-11 DIAGNOSIS — I469 Cardiac arrest, cause unspecified: Secondary | ICD-10-CM

## 2012-11-11 HISTORY — PX: IMPLANTABLE CARDIOVERTER DEFIBRILLATOR IMPLANT: SHX5473

## 2012-11-11 LAB — BASIC METABOLIC PANEL
BUN: 23 mg/dL (ref 6–23)
CO2: 31 mEq/L (ref 19–32)
Calcium: 9.9 mg/dL (ref 8.4–10.5)
Chloride: 103 mEq/L (ref 96–112)
Creatinine, Ser: 0.94 mg/dL (ref 0.50–1.35)
GFR calc Af Amer: >90 mL/min (ref >90)
GFR calc non Af Amer: 78 mL/min — ABNORMAL LOW (ref >90)
Glucose, Bld: 114 mg/dL — ABNORMAL HIGH (ref 70–99)
Potassium: 3.3 mEq/L — ABNORMAL LOW (ref 3.5–5.1)
Sodium: 141 mEq/L (ref 135–145)

## 2012-11-11 LAB — CBC
HCT: 27.4 % — ABNORMAL LOW (ref 39.0–52.0)
Hemoglobin: 9.1 g/dL — ABNORMAL LOW (ref 13.0–17.0)
MCH: 29.5 pg (ref 26.0–34.0)
MCHC: 33.2 g/dL (ref 30.0–36.0)
MCV: 89 fL (ref 78.0–100.0)
Platelets: 503 10*3/uL — ABNORMAL HIGH (ref 150–400)
RBC: 3.08 MIL/uL — ABNORMAL LOW (ref 4.22–5.81)
RDW: 14.2 % (ref 11.5–15.5)
WBC: 14 10*3/uL — ABNORMAL HIGH (ref 4.0–10.5)

## 2012-11-11 LAB — MAGNESIUM: Magnesium: 2.1 mg/dL (ref 1.5–2.5)

## 2012-11-11 LAB — PHOSPHORUS: Phosphorus: 3.3 mg/dL (ref 2.3–4.6)

## 2012-11-11 SURGERY — IMPLANTABLE CARDIOVERTER DEFIBRILLATOR IMPLANT
Anesthesia: LOCAL

## 2012-11-11 MED ORDER — ACETAMINOPHEN 325 MG PO TABS: 325.0000 mg | ORAL_TABLET | ORAL | Status: DC | PRN

## 2012-11-11 MED ORDER — CHLORHEXIDINE GLUCONATE CLOTH 2 % EX PADS
6.0000 | MEDICATED_PAD | Freq: Every day | CUTANEOUS | Status: DC
  Administered 2012-11-12: 6 via TOPICAL

## 2012-11-11 MED ORDER — FENTANYL CITRATE 0.05 MG/ML IJ SOLN
INTRAMUSCULAR | Status: AC
  Filled 2012-11-11: qty 2

## 2012-11-11 MED ORDER — SODIUM CHLORIDE 0.9 % IJ SOLN
INTRAMUSCULAR | Status: AC
  Filled 2012-11-11: qty 10

## 2012-11-11 MED ORDER — MUPIROCIN 2 % EX OINT
1.0000 "application " | TOPICAL_OINTMENT | Freq: Two times a day (BID) | CUTANEOUS | Status: DC
  Administered 2012-11-11 – 2012-11-12 (×2): 1 via NASAL
  Filled 2012-11-11 (×2): qty 22

## 2012-11-11 MED ORDER — METOPROLOL TARTRATE 50 MG PO TABS
50.0000 mg | ORAL_TABLET | Freq: Two times a day (BID) | ORAL | Status: DC
Start: 2012-11-11 — End: 2012-11-12
  Administered 2012-11-11 – 2012-11-12 (×2): 50 mg via ORAL
  Filled 2012-11-11 (×3): qty 1

## 2012-11-11 MED ORDER — LIDOCAINE HCL (PF) 1 % IJ SOLN
INTRAMUSCULAR | Status: AC
  Filled 2012-11-11: qty 60

## 2012-11-11 MED ORDER — POTASSIUM CHLORIDE 10 MEQ/100ML IV SOLN
10.0000 meq | INTRAVENOUS | Status: AC
  Administered 2012-11-11 (×2): 10 meq via INTRAVENOUS
  Filled 2012-11-11 (×2): qty 100

## 2012-11-11 MED ORDER — LIDOCAINE HCL (PF) 1 % IJ SOLN
INTRAMUSCULAR | Status: AC
  Filled 2012-11-11: qty 30

## 2012-11-11 MED ORDER — WHITE PETROLATUM GEL
Status: AC
  Administered 2012-11-11: 0.2
  Filled 2012-11-11: qty 5

## 2012-11-11 MED ORDER — SODIUM CHLORIDE 0.9 % IV SOLN: INTRAVENOUS | Status: AC

## 2012-11-11 MED ORDER — HEPARIN (PORCINE) IN NACL 2-0.9 UNIT/ML-% IJ SOLN
INTRAMUSCULAR | Status: AC
  Filled 2012-11-11: qty 500

## 2012-11-11 MED ORDER — DEXTROSE-NACL 5-0.45 % IV SOLN
INTRAVENOUS | Status: DC
  Administered 2012-11-11 (×2): via INTRAVENOUS

## 2012-11-11 MED ORDER — ONDANSETRON HCL 4 MG/2ML IJ SOLN: 4.0000 mg | Freq: Four times a day (QID) | INTRAMUSCULAR | Status: DC | PRN

## 2012-11-11 MED ORDER — MIDAZOLAM HCL 5 MG/5ML IJ SOLN
INTRAMUSCULAR | Status: AC
  Filled 2012-11-11: qty 5

## 2012-11-11 MED ORDER — VANCOMYCIN HCL IN DEXTROSE 1-5 GM/200ML-% IV SOLN
1000.0000 mg | Freq: Two times a day (BID) | INTRAVENOUS | Status: AC
  Administered 2012-11-11: 1000 mg via INTRAVENOUS
  Filled 2012-11-11: qty 200

## 2012-11-11 NOTE — Interval H&P Note (Signed)
History and Physical Interval Note:  10/31/2012 10:52 AM  [name] Christensen  has presented today for surgery, with the diagnosis of Heart failure  The various methods of treatment have been discussed with the patient and family. After consideration of risks, benefits and other options for treatment, the patient has consented to  Procedure(s) (LRB) with comments: IMPLANTABLE CARDIOVERTER DEFIBRILLATOR IMPLANT (N/A) as a surgical intervention .  The patient's history has been reviewed, patient examined, no change in status, stable for surgery.  I have reviewed the patient's chart and labs.  Questions were answered to the patient's satisfaction.     Sherryl Manges

## 2012-11-11 NOTE — Progress Notes (Signed)
Patient Name: [name] Christensen      SUBJECTIVE: without chest pain or sob  weak  Past Medical History  Diagnosis Date  . Coronary artery disease     s/p coronary bypass grafting 2005  . GERD (gastroesophageal reflux disease)   . Hyperlipidemia   . Hypertension   . BPH (benign prostatic hypertrophy)     elevated PSA Dr. Lindell Noe Bx 2010    PHYSICAL EXAM Filed Vitals:   11/13/2012 0500 10/26/2012 0600 11/13/2012 0700 11/17/2012 1015  BP: 142/59 135/62 143/69 134/69  Pulse: 87 86 93 95  Temp:   98.2 F (36.8 C)   TempSrc:   Oral   Resp:   18   Height:      Weight:  143 lb 14.4 oz (65.273 kg)    SpO2: 95% 96% 95%     Well developed and nourished in no acute distress HENT normal Neck supple with JVP-flat Clear Regular rate and rhythm, no murmurs or gallops Abd-soft with active BS No Clubbing cyanosis edema Foley in place Skin-warm and dry A & Oriented  Grossly normal sensory and motor function  TELEMETRY: Reviewed telemetry pt in sinus tach:    Intake/Output Summary (Last 24 hours) at 10/25/2012 1040 Last data filed at 11/22/2012 0600  Gross per 24 hour  Intake 411.67 ml  Output   1150 ml  Net -738.33 ml    LABS: Basic Metabolic Panel:  Lab 11/24/2012 7253 10/30/2012 1303 11/21/2012 0450 11/21/2012 0515 10/29/2012 0500 10/25/2012 0450 10/25/2012 0500 10/27/2012 0500  NA 141 -- 144 151* 149* 146* 142 137  K 3.3* -- 3.1* 3.7 3.4* 3.3* 3.5 3.8  CL 103 -- 102 112 111 109 106 102  CO2 31 -- 34* 33* 30 28 28 29   GLUCOSE 114* -- 136* 183* 122* 119* 152* 148*  BUN 23 -- 25* 31* 33* 32* 36* 30*  CREATININE 0.94 0.90 1.00 1.03 1.02 1.07 0.93 --  CALCIUM 9.9 -- 10.3 -- -- -- -- --  MG 2.1 -- 2.1 -- -- -- -- --  PHOS 3.3 -- 2.7 -- -- -- -- --   Cardiac Enzymes: No results found for this basename: CKTOTAL:3,CKMB:3,CKMBINDEX:3,TROPONINI:3 in the last 72 hours CBC:  Lab 11/15/2012 0500 11/22/2012 1519 11/19/2012 1303 11/18/2012 0450 11/20/2012 0500 11/15/2012 0450 11/21/2012 0500  WBC 14.0* 16.0* 16.6*  16.1* 12.3* 13.4* 12.4*  NEUTROABS -- -- -- 12.2* -- -- --  HGB 9.1* 10.1* 9.8* 10.0* 9.1* 8.4* 8.5*  HCT 27.4* 30.6* 30.0* 30.8* 27.6* 24.0* 24.7*  MCV 89.0 89.7 89.8 89.8 89.3 86.0 87.0  PLT 503* 531* 519* 505* 334 241 162   PROTIME:  Basename 10/27/2012 0450  LABPROT 16.1*  INR 1.32   Liver Function Tests: No results found for this basename: AST:2,ALT:2,ALKPHOS:2,BILITOT:2,PROT:2,ALBUMIN:2 in the last 72 hours No results found for this basename: LIPASE:2,AMYLASE:2 in the last 72 hours BNP: BNP (last 3 results)  Basename 11/20/2012 0410  PROBNP 1702.0*      ASSESSMENT AND PLAN:  Patient Active Hospital Problem List:   CORONARY ARTERY DISEASE (September 27, 2007)    Sudden cardiac death (2012-11-26)  Hypokalemia   [pt s/p cardiac arrest without MI>  Plan is for ICD implantation and medical therapy dfor his CAD **  Repelte K  Will plan to implant device but defer DFT 2/2 K and still resolving pulmonary issues   Have reviewed the potential benefits and risks of ICD implantation including but not limited to death, perforation of heart or lung, lead dislodgement, infection,  device malfunction and inappropriate shocks.  The patient and familyexpress understanding  and are willing to proceed.     Signed, Sherryl Manges MD  11/14/2012

## 2012-11-11 NOTE — Progress Notes (Signed)
Occupational Therapy Treatment Patient Details Name: [name] Christensen MRN: [id number] DOB: [birth year] Today's Date: 11/13/2012 Time: 4098-1191 OT Time Calculation (min): 61 min  OT Assessment / Plan / Recommendation Comments on Treatment Session Pt. with improving activity tolerance.  Pt sats 88% on 4L, but increased time to recover.  Pt. placed on venti mask 50% FIO2 and able to maintain sats at 94-95% while participating in therpeutic activities.  Pt. very motivated.  Overall is mod A with BADLs due to decreased activity tolerance    Follow Up Recommendations  CIR;Supervision/Assistance - 24 hour    Barriers to Discharge       Equipment Recommendations  3 in 1 bedside comode;None recommended by OT    Recommendations for Other Services    Frequency Min 2X/week   Plan Discharge plan remains appropriate    Precautions / Restrictions Precautions Precautions: Fall Precaution Comments: oxygen Restrictions Weight Bearing Restrictions: No       ADL  Eating/Feeding: Set up;Supervision/safety Grooming: Wash/dry hands;Brushing hair;Minimal assistance Where Assessed - Grooming: Supported standing Lower Body Dressing: Moderate assistance Where Assessed - Lower Body Dressing: Supported sit to Pharmacist, hospital: Minimal assistance Toilet Transfer Method: Sit to stand;Stand pivot Toileting - Clothing Manipulation and Hygiene: Minimal assistance Where Assessed - Toileting Clothing Manipulation and Hygiene: Standing Transfers/Ambulation Related to ADLs: Ambulates with min A ADL Comments: Pt. on 4L 02 via [place].  Participated in 8 mins therapeutic activity and ambulation with sats decreasing to 88% on 4L.  Pt vascilated between 87-90 on 4L, 02 increased to 6L, but still vascilating.  Veti mask placed at 50% FIO2 and pt recovered to 95% after another 5 mins.  Pt. then able to particiapte in another ~8 mins therapeutic activity/ambulation with sats 94%.  RN made aware, and dtr present    OT  Diagnosis:    OT Problem List:   OT Treatment Interventions:     OT Goals ADL Goals ADL Goal: Grooming - Progress: Progressing toward goals ADL Goal: Lower Body Dressing - Progress: Progressing toward goals ADL Goal: Toilet Transfer - Progress: Progressing toward goals ADL Goal: Toileting - Clothing Manipulation - Progress: Progressing toward goals ADL Goal: Additional Goal #1 - Progress: Progressing toward goals  Visit Information  Last OT Received On: 11/17/2012 Assistance Needed: +1    Subjective Data      Prior Functioning       Cognition  Overall Cognitive Status: Impaired Area of Impairment: Attention;Memory;Safety/judgement Arousal/Alertness: Awake/alert Orientation Level: Appears intact for tasks assessed Behavior During Session: [facility] for tasks performed Current Attention Level: Selective Memory Deficits: Pt repeats self frequently Following Commands: Follows one step commands consistently Safety/Judgement: Decreased safety judgement for tasks assessed Safety/Judgement - Other Comments: Tends to reach out an hold to objects causing decreased balance.    Mobility  Shoulder Instructions Bed Mobility Bed Mobility: Supine to Sit;Sitting - Scoot to Edge of Bed Rolling Right: 5: Supervision;With rail Sitting - Scoot to Edge of Bed: 5: Supervision Transfers Transfers: Sit to Stand;Stand to Sit Sit to Stand: 4: Min assist;From bed;From chair/3-in-1;With upper extremity assist Stand to Sit: To chair/3-in-1;To bed;With upper extremity assist Details for Transfer Assistance: minimally unsteady       Exercises      Balance Balance Balance Assessed: Yes Static Sitting Balance Static Sitting - Balance Support: Feet supported;Bilateral upper extremity supported Static Sitting - Level of Assistance: 5: Stand by assistance Static Standing Balance Static Standing - Balance Support: Right upper extremity supported Static Standing - Level of Assistance:  4: Min  assist Static Standing - Comment/# of Minutes: 8    End of Session OT - End of Session Activity Tolerance: Patient limited by fatigue;Other (comment) (02 desat) Patient left: in chair;with call bell/phone within reach;with nursing in room Nurse Communication: Mobility status;Other (comment) (02 sats)  GO     Ervine Witucki, Ursula Alert M 10/29/2012, 12:23 PM

## 2012-11-11 NOTE — Progress Notes (Signed)
Pt alert and oriented x4.  Consent signed in chart. Pt aware of procedure today. Pt has been NPO since MN.  Tele box removed from patient.  Pt placed on portable tele and O2 with venti mask.  Pt denies any distress.  Pt transferred to cath lab with cath lab personnel and RN.  Awaits return.  [name] Christensen

## 2012-11-11 NOTE — Progress Notes (Signed)
NUTRITION FOLLOW UP  Intervention:   1. Continue Ensure Pudding po TID, each supplement provides 170 kcal and 4 grams of protein.  2.  Magic cup TID between meals, each supplement provides 290 kcal and 9 grams of protein.  Once diet advanced    Nutrition Dx:   Inadequate oral intake now related to dislike of foods provided as evidenced by 30% meal completions  Goal:   PO intake to meet >/=90% estimated nutrition needs, unmet  Monitor:   PO intake, weight trends, labs  Assessment:   Pt NPO this morning for procedure. States he has more appetite but still does not like most foods provided. Family has not brought any foods from home. Does not like the chocolate Ensure Pudding, encouraged him to try th vanilla. Will also try Magic Cup.  Height: Ht Readings from Last 1 Encounters:  11/15/2012 5\' 11"  (1.803 m)    Weight Status:   Wt Readings from Last 1 Encounters:  11/20/2012 143 lb 14.4 oz (65.273 kg)  Admission weight: 169 lbs.   Re-estimated needs:  Kcal: 1500-1700 Protein: 75-90 gm  Fluid: 1.5-1.7 L/day    Skin: intact  Diet Order: NPO  Previously D3, nectar   Intake/Output Summary (Last 24 hours) at 10/26/2012 1014 Last data filed at 11/16/2012 0600  Gross per 24 hour  Intake 411.67 ml  Output   1150 ml  Net -738.33 ml    Last BM: 12/16   Labs:   Lab 10/28/2012 0500 11/05/2012 1303 10/29/2012 0450 11-29-2012 0515  NA 141 -- 144 151*  K 3.3* -- 3.1* 3.7  CL 103 -- 102 112  CO2 31 -- 34* 33*  BUN 23 -- 25* 31*  CREATININE 0.94 0.90 1.00 --  CALCIUM 9.9 -- 10.3 10.5  MG 2.1 -- 2.1 --  PHOS 3.3 -- 2.7 --  GLUCOSE 114* -- 136* 183*   CBG (last 3)   Basename 10/26/2012 1201  GLUCAP 118*    Scheduled Meds:    . aspirin  81 mg Oral Daily  . atorvastatin  40 mg Oral q1800  . feeding supplement  1 Container Oral TID BM  . gentamicin irrigation  80 mg Irrigation On Call  . heparin  5,000 Units Subcutaneous Q8H  . magic mouthwash  2 mL Oral QID  . metoprolol  tartrate  25 mg Oral BID  . pantoprazole (PROTONIX) IV  40 mg Intravenous QHS  . vancomycin  1,000 mg Intravenous On Call   Continuous Infusions:    . sodium chloride 50 mL/hr at 11/18/2012 0346  . dextrose 50 mL/hr at 11/17/2012 0700       Clarene Duke RD, LDN Pager [phone] After Hours pager [phone]

## 2012-11-11 NOTE — Progress Notes (Signed)
PULMONARY  / CRITICAL CARE MEDICINE  Name: [name] Christensen MRN: [id number] DOB: [birth year]    LOS: 11  CHIEF COMPLAINT:  Cardiac Arrest  BRIEF PATIENT DESCRIPTION:  78 yo male admitted to [facility] on 11/15/2012 with cardiac arrest, VDRF with subsequent transfer to [facility] for hypothermia protocol.  Significant PMHx of CAD s/p CABG, HTN, Hyperlipidemia, GERD, BPH  LINES / TUBES: ETT 12/7 >>>12/13 Rt IJ CVL 12/7>>> Rt radial aline 12/7>>>12/10  CULTURES: MRSA screen 12/07>>POSITIVE Blood 12/08>>negative Sputum 12/11>>oral flora  ANTIBIOTICS: Cipro 12/08>>12/11 Vancomycin 12/11>>12/15 Levaquin 12/11>>   EVENTS:  12/7 Out of hospital arrest, estimated 15 minutes down time 12/8- slight brady with cooling, low dose levo needs 12/9 Rewarmed 12/10 Off pressors 12/11 Increased respiratory secretions, low grade temperature 12/12 Changed to precedex 12/13 Improved mental status  TESTS: 12/07 CT head >> Mild atrophy, chronic small vessel white matter ischemic changes.  Chronic bilateral ethmoid, frontal and sphenoid sinusitis. 12/08 Echo >> EF 40 to 45%, grade 2 diastolic dysfx 12/12 EEG >> severe slowing of cerebral activity 12/12 MRI Brain >>mild/mod small vessel disease, global atrophy.  Opacification mastoid air cells and middle ear cavity bilaterally.  Paranasal sinus opacification   LEVEL OF CARE:  SDU PRIMARY SERVICE:  PCCM CONSULTANTS:  [facility] (signed off 12/11) CODE STATUS: Full DIET:  Tube feeds DVT Px:  Heparin SQ GI Px:  protonix   INTERVAL HISTORY:  More alert.  Decreased O2 needs.  Denies chest pain.  VITAL SIGNS: Temp:  [97.2 F (36.2 C)-98.2 F (36.8 C)] 98.2 F (36.8 C) (12/18 0700) Pulse Rate:  [74-112] 112  (12/18 1206) Resp:  [10-24] 18  (12/18 0700) BP: (131-150)/(59-81) 134/69 mmHg (12/18 1015) SpO2:  [86 %-98 %] 95 % (12/18 1206) FiO2 (%):  [50 %] 50 % (12/18 1206) Weight:  [65.273 kg (143 lb 14.4 oz)] 65.273 kg (143 lb 14.4 oz) (12/18  0600) HEMODYNAMICS:   VENTILATOR SETTINGS: Vent Mode:  [-]  FiO2 (%):  [50 %] 50 % INTAKE / OUTPUT: Intake/Output      12/17 0701 - 12/18 0700 12/18 0701 - 12/19 0700   P.O. 360    I.V. (mL/kg) 111.7 (1.7)    IV Piggyback     Total Intake(mL/kg) 471.7 (7.2)    Urine (mL/kg/hr) 1150 (0.7) 300 (0.9)   Total Output 1150 300   Net -678.3 -300        Stool Occurrence 1 x       PHYSICAL EXAMINATION: General: No distress Neuro:  Calm, follows commands, moves all extremities, trouble with memory, speech improved HEENT:  No sinus tenderness Cardiovascular: s1s2 no murmur Lungs:  Scattered rhonchi Abdomen:  Soft, non tender, + bowel sounds Musculoskeletal:  No edema Skin: no rashes   LABS: Cbc  Lab 10/29/2012 0500 11/19/2012 1519 11/15/2012 1303  WBC 14.0* -- --  HGB 9.1* 10.1* 9.8*  HCT 27.4* 30.6* 30.0*  PLT 503* 531* 519*    Chemistry   Lab 11/14/2012 0500 11/24/2012 1303 11/14/2012 0450 11/16/2012 0515  NA 141 -- 144 151*  K 3.3* -- 3.1* 3.7  CL 103 -- 102 112  CO2 31 -- 34* 33*  BUN 23 -- 25* 31*  CREATININE 0.94 0.90 1.00 --  CALCIUM 9.9 -- 10.3 10.5  MG 2.1 -- 2.1 --  PHOS 3.3 -- 2.7 --  GLUCOSE 114* -- 136* 183*    Liver fxn No results found for this basename: AST:3,ALT:3,ALKPHOS:3,BILITOT:3,PROT:3,ALBUMIN:3 in the last 168 hours coags  Lab 11/22/2012 0450  APTT --  INR 1.32   Sepsis markers No results found for this basename: LATICACIDVEN:3,PROCALCITON:3 in the last 168 hours Cardiac markers No results found for this basename: CKTOTAL:3,CKMB:3,TROPONINI:3 in the last 168 hours BNP No results found for this basename: PROBNP:3 in the last 168 hours ABG No results found for this basename: PHART:3,PCO2ART:3,PO2ART:3,HCO3:3,TCO2:3 in the last 168 hours  CBG trend  Lab 10/25/2012 1201 11/16/2012 0810 10/30/2012 0023 10/28/2012 2021 11/16/2012 1129  GLUCAP 118* 99 105* 107* 75    IMAGING: Dg Chest Port 1 View  11/19/2012  *RADIOLOGY REPORT*  Clinical Data: Pulmonary  edema.  PORTABLE CHEST - 1 VIEW  Comparison: 10/25/2012.  Findings: The cardiac silhouette, mediastinal and hilar contours are stable.  Persistent interstitial airspace process.  No definite pleural effusions or pneumothorax.  IMPRESSION: Persistent interstitial and airspace process.   Original Report Authenticated By: Rudie Meyer, M.D.      DIAGNOSES: Active Problems:  HYPERLIPIDEMIA  HYPERTENSION  CORONARY ARTERY DISEASE  CORONARY ARTERY BYPASS GRAFT, HX OF  Sudden cardiac death  Acute respiratory failure  Pneumonia   ASSESSMENT / PLAN:  PULMONARY  ASSESSMENT:  Acute respiratory failure 2nd to cardiac arrest with acute pulmonary edema, complicated by HCAP. Much improved.  PLAN:   D/C BiPAP. F/u CXR intermittently. Adjust FiO2 to keep Spo2 > 92%. Pre-op hydration started for pacer placement in AM.  CARDIOVASCULAR  ASSESSMENT:  Presumed VF cardiac arrest. Acute on chronic systolic, diastolic CHF. Hx of CAD, HTN, Hyperlipidemia. Cath results noted.  Will likely need implantable defib. PLAN:  Continue aspirin 81 mg qd. Changed lopressor to 25 mg PO BID and keep IV as PRN. Appreciate input from cards, ICD placement pre cards.  RENAL  ASSESSMENT:   Hyponatremia. Likely from hypervolemia.  Resolved with diuresis now hypernatremic improved with hydration. Hypokalemia.  PLAN:   D/C lasix (keep hydrated for cath in AM). Pre-op hydration as ordered by cards. F/u and replace electrolytes as needed. Replace K.  GASTROINTESTINAL  ASSESSMENT:   Elevated LFT's.   Likely from cardiac arrest/shock.  Improving. Hepatitis C Ab reactive from 11/13/2012>>Hep C RNA PCR negative 10/28/2012. Nutrition. Dysphagia.  PLAN:   Diet as ordered.  HEMATOLOGIC  ASSESSMENT:   Anemia critical illness. Mild thrombocytopenia.  PLAN:  F/u CBC intermittently  INFECTIOUS  ASSESSMENT:  ?UTI. Increased respiratory secretions, low grade temperature, persistent pulmonary infiltrates  12/11. Hx of PCN allergy. PLAN:   D10/10 Abx, D7/7  levaquin will d/c. D/c vancomycin 12/15 Maintain off all abx.  ENDOCRINE  ASSESSMENT:   Hyperglycemia 2nd to acute stress. No hx of DM>>CBG's much improved  PLAN:   D/c'ed SSI 12/14 Monitor blood sugars on BMET  NEUROLOGIC  ASSESSMENT:   Acute encephalopathy 2nd to cardiac arrest. Agitation much better with change to precedex 12/12.  Much improved mental status 12/13>>continues to improve 12/15  PLAN:   Monitor mental status No further sedation/precedex at this time.  Transfer to [facility] service in AM.  ICD placement in AM.  PCCM will sign off, please call back if needed.  Alyson Reedy, M.D. [facility] Pulmonary/Critical Care Medicine. Pager: [phone]. After hours pager: [phone].

## 2012-11-11 NOTE — Progress Notes (Signed)
We will follow up in the morning to assist with planning timing of admission to inpt rehab. [phone]. Melanee Spry will see in the a.m. [phone]

## 2012-11-11 NOTE — CV Procedure (Signed)
[name] Christensen [id number]  [id number]  Preop Dx: aborted cardiac arrest Postop Dx same/   Procedure:Icd implant   Cx: None   Dictation number  [id number]  Sherryl Manges, MD 10/28/2012 4:37 PM

## 2012-11-11 NOTE — Clinical Social Work Note (Signed)
Clinical Social Worker received referral for potential SNF placement. CSW reviewed chart and noticed patient is appropriate for CIR placement. CSW will sign off, as social work intervention is no longer needed.   Rozetta Nunnery MSW, [employer] [phone]

## 2012-11-11 NOTE — H&P (View-Only) (Signed)
Patient Name: [name] Christensen      SUBJECTIVE: without chest pain or sob  weak  Past Medical History  Diagnosis Date  . Coronary artery disease     s/p coronary bypass grafting 2005  . GERD (gastroesophageal reflux disease)   . Hyperlipidemia   . Hypertension   . BPH (benign prostatic hypertrophy)     elevated PSA Dr. Lindell Noe Bx 2010    PHYSICAL EXAM Filed Vitals:   11/13/2012 0500 10/26/2012 0600 11/13/2012 0700 11/17/2012 1015  BP: 142/59 135/62 143/69 134/69  Pulse: 87 86 93 95  Temp:   98.2 F (36.8 C)   TempSrc:   Oral   Resp:   18   Height:      Weight:  143 lb 14.4 oz (65.273 kg)    SpO2: 95% 96% 95%     Well developed and nourished in no acute distress HENT normal Neck supple with JVP-flat Clear Regular rate and rhythm, no murmurs or gallops Abd-soft with active BS No Clubbing cyanosis edema Foley in place Skin-warm and dry A & Oriented  Grossly normal sensory and motor function  TELEMETRY: Reviewed telemetry pt in sinus tach:    Intake/Output Summary (Last 24 hours) at 10/25/2012 1040 Last data filed at 11/22/2012 0600  Gross per 24 hour  Intake 411.67 ml  Output   1150 ml  Net -738.33 ml    LABS: Basic Metabolic Panel:  Lab 11/24/2012 7253 10/30/2012 1303 11/21/2012 0450 11/21/2012 0515 10/29/2012 0500 10/25/2012 0450 10/25/2012 0500 10/27/2012 0500  NA 141 -- 144 151* 149* 146* 142 137  K 3.3* -- 3.1* 3.7 3.4* 3.3* 3.5 3.8  CL 103 -- 102 112 111 109 106 102  CO2 31 -- 34* 33* 30 28 28 29   GLUCOSE 114* -- 136* 183* 122* 119* 152* 148*  BUN 23 -- 25* 31* 33* 32* 36* 30*  CREATININE 0.94 0.90 1.00 1.03 1.02 1.07 0.93 --  CALCIUM 9.9 -- 10.3 -- -- -- -- --  MG 2.1 -- 2.1 -- -- -- -- --  PHOS 3.3 -- 2.7 -- -- -- -- --   Cardiac Enzymes: No results found for this basename: CKTOTAL:3,CKMB:3,CKMBINDEX:3,TROPONINI:3 in the last 72 hours CBC:  Lab 11/15/2012 0500 11/22/2012 1519 11/19/2012 1303 11/18/2012 0450 11/20/2012 0500 11/15/2012 0450 11/21/2012 0500  WBC 14.0* 16.0* 16.6*  16.1* 12.3* 13.4* 12.4*  NEUTROABS -- -- -- 12.2* -- -- --  HGB 9.1* 10.1* 9.8* 10.0* 9.1* 8.4* 8.5*  HCT 27.4* 30.6* 30.0* 30.8* 27.6* 24.0* 24.7*  MCV 89.0 89.7 89.8 89.8 89.3 86.0 87.0  PLT 503* 531* 519* 505* 334 241 162   PROTIME:  Basename 10/27/2012 0450  LABPROT 16.1*  INR 1.32   Liver Function Tests: No results found for this basename: AST:2,ALT:2,ALKPHOS:2,BILITOT:2,PROT:2,ALBUMIN:2 in the last 72 hours No results found for this basename: LIPASE:2,AMYLASE:2 in the last 72 hours BNP: BNP (last 3 results)  Basename 11/20/2012 0410  PROBNP 1702.0*      ASSESSMENT AND PLAN:  Patient Active Hospital Problem List:   CORONARY ARTERY DISEASE (September 27, 2007)    Sudden cardiac death (2012-11-26)  Hypokalemia   [pt s/p cardiac arrest without MI>  Plan is for ICD implantation and medical therapy dfor his CAD **  Repelte K  Will plan to implant device but defer DFT 2/2 K and still resolving pulmonary issues   Have reviewed the potential benefits and risks of ICD implantation including but not limited to death, perforation of heart or lung, lead dislodgement, infection,  device malfunction and inappropriate shocks.  The patient and familyexpress understanding  and are willing to proceed.     Signed, Sherryl Manges MD  11/14/2012

## 2012-11-12 ENCOUNTER — Encounter (HOSPITAL_COMMUNITY): Payer: Self-pay | Admitting: *Deleted

## 2012-11-12 ENCOUNTER — Inpatient Hospital Stay (HOSPITAL_COMMUNITY)
Admission: RE | Admit: 2012-11-12 | Discharge: 2012-11-17 | DRG: 945 | Disposition: A | Discharge status: Home / Self Care | Payer: Medicare Other | Source: Ambulatory Visit | Attending: Physical Medicine & Rehabilitation | Admitting: Physical Medicine & Rehabilitation

## 2012-11-12 ENCOUNTER — Inpatient Hospital Stay (HOSPITAL_COMMUNITY): Payer: Medicare Other

## 2012-11-12 DIAGNOSIS — G9340 Encephalopathy, unspecified: Secondary | ICD-10-CM

## 2012-11-12 DIAGNOSIS — E785 Hyperlipidemia, unspecified: Secondary | ICD-10-CM | POA: Diagnosis present

## 2012-11-12 DIAGNOSIS — G931 Anoxic brain damage, not elsewhere classified: Secondary | ICD-10-CM | POA: Diagnosis not present

## 2012-11-12 DIAGNOSIS — I2510 Atherosclerotic heart disease of native coronary artery without angina pectoris: Secondary | ICD-10-CM | POA: Diagnosis present

## 2012-11-12 DIAGNOSIS — Z5189 Encounter for other specified aftercare: Principal | ICD-10-CM

## 2012-11-12 DIAGNOSIS — Z95810 Presence of automatic (implantable) cardiac defibrillator: Secondary | ICD-10-CM | POA: Diagnosis not present

## 2012-11-12 DIAGNOSIS — Z981 Arthrodesis status: Secondary | ICD-10-CM | POA: Diagnosis not present

## 2012-11-12 DIAGNOSIS — E871 Hypo-osmolality and hyponatremia: Secondary | ICD-10-CM

## 2012-11-12 DIAGNOSIS — I10 Essential (primary) hypertension: Secondary | ICD-10-CM | POA: Diagnosis not present

## 2012-11-12 DIAGNOSIS — Z951 Presence of aortocoronary bypass graft: Secondary | ICD-10-CM

## 2012-11-12 DIAGNOSIS — D509 Iron deficiency anemia, unspecified: Secondary | ICD-10-CM | POA: Diagnosis present

## 2012-11-12 DIAGNOSIS — D72829 Elevated white blood cell count, unspecified: Secondary | ICD-10-CM | POA: Diagnosis present

## 2012-11-12 DIAGNOSIS — R5381 Other malaise: Secondary | ICD-10-CM | POA: Diagnosis not present

## 2012-11-12 DIAGNOSIS — N400 Enlarged prostate without lower urinary tract symptoms: Secondary | ICD-10-CM | POA: Diagnosis present

## 2012-11-12 DIAGNOSIS — J189 Pneumonia, unspecified organism: Secondary | ICD-10-CM | POA: Diagnosis present

## 2012-11-12 DIAGNOSIS — I469 Cardiac arrest, cause unspecified: Secondary | ICD-10-CM | POA: Diagnosis not present

## 2012-11-12 DIAGNOSIS — E875 Hyperkalemia: Secondary | ICD-10-CM

## 2012-11-12 LAB — CBC
HCT: 26.1 % — ABNORMAL LOW (ref 39.0–52.0)
Hemoglobin: 8.8 g/dL — ABNORMAL LOW (ref 13.0–17.0)
MCH: 30 pg (ref 26.0–34.0)
MCHC: 33.7 g/dL (ref 30.0–36.0)
MCV: 89.1 fL (ref 78.0–100.0)
Platelets: 498 10*3/uL — ABNORMAL HIGH (ref 150–400)
RBC: 2.93 MIL/uL — ABNORMAL LOW (ref 4.22–5.81)
RDW: 13.9 % (ref 11.5–15.5)
WBC: 15 10*3/uL — ABNORMAL HIGH (ref 4.0–10.5)

## 2012-11-12 LAB — URINE CULTURE
Colony Count: NO GROWTH
Culture: NO GROWTH

## 2012-11-12 LAB — BASIC METABOLIC PANEL
BUN: 20 mg/dL (ref 6–23)
CO2: 29 mEq/L (ref 19–32)
Calcium: 9.4 mg/dL (ref 8.4–10.5)
Chloride: 99 mEq/L (ref 96–112)
Creatinine, Ser: 0.98 mg/dL (ref 0.50–1.35)
GFR calc Af Amer: 89 mL/min — ABNORMAL LOW (ref >90)
GFR calc non Af Amer: 77 mL/min — ABNORMAL LOW (ref >90)
Glucose, Bld: 118 mg/dL — ABNORMAL HIGH (ref 70–99)
Potassium: 3.6 mEq/L (ref 3.5–5.1)
Sodium: 134 mEq/L — ABNORMAL LOW (ref 135–145)

## 2012-11-12 LAB — MAGNESIUM: Magnesium: 2 mg/dL (ref 1.5–2.5)

## 2012-11-12 LAB — PHOSPHORUS: Phosphorus: 2.6 mg/dL (ref 2.3–4.6)

## 2012-11-12 MED ORDER — PROCHLORPERAZINE EDISYLATE 5 MG/ML IJ SOLN
5.0000 mg | Freq: Four times a day (QID) | INTRAMUSCULAR | Status: DC | PRN
  Filled 2012-11-12: qty 2

## 2012-11-12 MED ORDER — METOPROLOL TARTRATE 50 MG PO TABS
50.0000 mg | ORAL_TABLET | Freq: Two times a day (BID) | ORAL | Status: DC
  Administered 2012-11-12 – 2012-11-17 (×10): 50 mg via ORAL
  Filled 2012-11-12 (×12): qty 1

## 2012-11-12 MED ORDER — MUPIROCIN 2 % EX OINT: 1.0000 "application " | TOPICAL_OINTMENT | Freq: Two times a day (BID) | CUTANEOUS | Status: DC

## 2012-11-12 MED ORDER — CLOPIDOGREL BISULFATE 75 MG PO TABS
75.0000 mg | ORAL_TABLET | Freq: Every day | ORAL | Status: DC
  Administered 2012-11-13 – 2012-11-17 (×5): 75 mg via ORAL
  Filled 2012-11-12 (×6): qty 1

## 2012-11-12 MED ORDER — PROCHLORPERAZINE MALEATE 5 MG PO TABS
5.0000 mg | ORAL_TABLET | Freq: Four times a day (QID) | ORAL | Status: DC | PRN
  Filled 2012-11-12: qty 2

## 2012-11-12 MED ORDER — ACETAMINOPHEN 325 MG PO TABS
325.0000 mg | ORAL_TABLET | ORAL | Status: DC | PRN
  Administered 2012-11-14 – 2012-11-16 (×6): 650 mg via ORAL
  Filled 2012-11-12 (×7): qty 2

## 2012-11-12 MED ORDER — BISACODYL 10 MG RE SUPP: 10.0000 mg | Freq: Every day | RECTAL | Status: DC | PRN

## 2012-11-12 MED ORDER — MUPIROCIN 2 % EX OINT
1.0000 "application " | TOPICAL_OINTMENT | Freq: Two times a day (BID) | CUTANEOUS | Status: AC
  Administered 2012-11-12 – 2012-11-16 (×8): 1 via NASAL
  Filled 2012-11-12 (×2): qty 22

## 2012-11-12 MED ORDER — ATORVASTATIN CALCIUM 40 MG PO TABS
40.0000 mg | ORAL_TABLET | Freq: Every day | ORAL | Status: DC
  Administered 2012-11-12 – 2012-11-13 (×2): 40 mg via ORAL
  Filled 2012-11-12 (×2): qty 1

## 2012-11-12 MED ORDER — MAGIC MOUTHWASH: 2.0000 mL | Freq: Four times a day (QID) | ORAL | Status: DC

## 2012-11-12 MED ORDER — ENSURE PUDDING PO PUDG
1.0000 | Freq: Three times a day (TID) | ORAL | Status: DC
  Administered 2012-11-12 – 2012-11-17 (×6): 1 via ORAL

## 2012-11-12 MED ORDER — RESOURCE THICKENUP CLEAR PO POWD: ORAL | Status: DC

## 2012-11-12 MED ORDER — ASPIRIN 81 MG PO CHEW
81.0000 mg | CHEWABLE_TABLET | Freq: Every day | ORAL | Status: DC
  Administered 2012-11-13: 81 mg via ORAL
  Filled 2012-11-12: qty 1

## 2012-11-12 MED ORDER — PROCHLORPERAZINE 25 MG RE SUPP
12.5000 mg | Freq: Four times a day (QID) | RECTAL | Status: DC | PRN
  Filled 2012-11-12: qty 1

## 2012-11-12 MED ORDER — ENSURE PUDDING PO PUDG: 1.0000 | Freq: Three times a day (TID) | ORAL | Status: DC

## 2012-11-12 MED ORDER — ALUM & MAG HYDROXIDE-SIMETH 200-200-20 MG/5ML PO SUSP: 30.0000 mL | ORAL | Status: DC | PRN

## 2012-11-12 MED ORDER — ACETAMINOPHEN 325 MG PO TABS: 325.0000 mg | ORAL_TABLET | ORAL | Status: DC | PRN

## 2012-11-12 MED ORDER — BIOTENE DRY MOUTH MT LIQD
15.0000 mL | Freq: Two times a day (BID) | OROMUCOSAL | Status: DC
  Administered 2012-11-12: 15 mL via OROMUCOSAL

## 2012-11-12 MED ORDER — ASPIRIN 81 MG PO CHEW: 81.0000 mg | CHEWABLE_TABLET | Freq: Every day | ORAL | Status: DC

## 2012-11-12 MED ORDER — ALFUZOSIN HCL ER 10 MG PO TB24
10.0000 mg | ORAL_TABLET | Freq: Every day | ORAL | Status: DC
  Administered 2012-11-13 – 2012-11-17 (×5): 10 mg via ORAL
  Filled 2012-11-12 (×6): qty 1

## 2012-11-12 MED ORDER — METOPROLOL TARTRATE 50 MG PO TABS: 50.0000 mg | ORAL_TABLET | Freq: Two times a day (BID) | ORAL | Status: DC

## 2012-11-12 MED ORDER — PANTOPRAZOLE SODIUM 40 MG PO PACK
40.0000 mg | PACK | Freq: Every day | ORAL | Status: DC
  Administered 2012-11-12: 40 mg
  Filled 2012-11-12: qty 20

## 2012-11-12 MED ORDER — GUAIFENESIN-DM 100-10 MG/5ML PO SYRP: 5.0000 mL | ORAL_SOLUTION | Freq: Four times a day (QID) | ORAL | Status: DC | PRN

## 2012-11-12 MED ORDER — FLEET ENEMA 7-19 GM/118ML RE ENEM: 1.0000 | ENEMA | Freq: Once | RECTAL | Status: AC | PRN

## 2012-11-12 MED ORDER — MAGIC MOUTHWASH
2.0000 mL | Freq: Four times a day (QID) | ORAL | Status: DC
  Administered 2012-11-13 (×2): 2 mL via ORAL
  Filled 2012-11-12 (×7): qty 5

## 2012-11-12 MED ORDER — ENOXAPARIN SODIUM 40 MG/0.4ML ~~LOC~~ SOLN
40.0000 mg | SUBCUTANEOUS | Status: DC
  Administered 2012-11-12 – 2012-11-16 (×5): 40 mg via SUBCUTANEOUS
  Filled 2012-11-12 (×6): qty 0.4

## 2012-11-12 MED ORDER — HEPARIN SODIUM (PORCINE) 5000 UNIT/ML IJ SOLN: 5000.0000 [IU] | Freq: Three times a day (TID) | INTRAMUSCULAR | Status: DC

## 2012-11-12 MED ORDER — POLYSACCHARIDE IRON COMPLEX 150 MG PO CAPS
150.0000 mg | ORAL_CAPSULE | Freq: Two times a day (BID) | ORAL | Status: DC
  Administered 2012-11-12 – 2012-11-16 (×9): 150 mg via ORAL
  Filled 2012-11-12 (×12): qty 1

## 2012-11-12 MED ORDER — POLYETHYLENE GLYCOL 3350 17 G PO PACK
17.0000 g | PACK | Freq: Every day | ORAL | Status: DC | PRN
  Administered 2012-11-14 – 2012-11-16 (×2): 17 g via ORAL
  Filled 2012-11-12 (×2): qty 1

## 2012-11-12 NOTE — Progress Notes (Signed)
[name] Date of Encounter: 11/15/2012, 7:56 AM Admit date: 11/21/2012     Subjective  Mr. Seim has no complaints this AM. He denies CP or SOB.    Objective  Physical Exam: Vitals: BP 149/58  Pulse 84  Temp 98.6 F (37 C) (Oral)  Resp 19  Ht 5\' 11"  (1.803 m)  Wt 132 lb 7.9 oz (60.1 kg)  BMI 18.48 kg/m2  SpO2 94% General: Well developed 78 year old male in no acute distress. Neck: Supple. JVD not elevated. Lungs: Clear bilaterally to auscultation without wheezes, rales, or rhonchi. Breathing is unlabored. Heart: RRR S1 S2 without murmurs, rubs, or gallops.  Abdomen: Soft, non-distended. Extremities: No clubbing or cyanosis. No edema.  Distal pedal pulses are 2+ and equal bilaterally. Neuro: Alert and oriented X 3. Moves all extremities spontaneously. No focal deficits. Skin: Left upper chest/implant site intact without bleeding or hematoma.  Intake/Output:  Intake/Output Summary (Last 24 hours) at 11/24/2012 0756 Last data filed at 11/22/2012 0600  Gross per 24 hour  Intake 946.67 ml  Output    500 ml  Net 446.67 ml    Inpatient Medications:     . aspirin  81 mg Oral Daily  . atorvastatin  40 mg Oral q1800  . Chlorhexidine Gluconate Cloth  6 each Topical Q0600  . feeding supplement  1 Container Oral TID BM  . heparin  5,000 Units Subcutaneous Q8H  . magic mouthwash  2 mL Oral QID  . metoprolol tartrate  50 mg Oral BID  . mupirocin ointment  1 application Nasal BID  . pantoprazole (PROTONIX) IV  40 mg Intravenous QHS  . sodium chloride          . dextrose 50 mL/hr at 10/28/2012 0700  . dextrose 5 % and 0.45% NaCl 50 mL/hr at 10/29/2012 2157    Labs:  [facility] 10/30/2012 0445 11/21/2012 0500  NA 134* 141  K 3.6 3.3*  CL 99 103  CO2 29 31  GLUCOSE 118* 114*  BUN 20 23  CREATININE 0.98 0.94  CALCIUM 9.4 9.9  MG 2.0 2.1  PHOS 2.6 3.3    Basename 11/14/2012 0445 11/14/2012 0500 10/30/2012 0450  WBC 15.0* 14.0* --  NEUTROABS -- -- 12.2*  HGB  8.8* 9.1* --  HCT 26.1* 27.4* --  MCV 89.1 89.0 --  PLT 498* 503* --    Basename 10/28/2012 0450  INR 1.32    Radiology/Studies:  Chest x-ray: pending Device interrogation: performed this AM shows normal ICD function with stable lead measurements Telemetry: normal sinus rhythm    Assessment and Plan  1. Presumed VF arrest - now s/p ICD implant yesterday for secondary prevention of SCD 2. CAD s/p CABG - all grafts widely patent, atretic LIMA to LAD but native LAD has no obstructive dz and is unchanged from previous cath in 2007; continue medical management  3. Ischemic CM, EF 40-45% by echo on admission; normal LVEF by cath today  4. HTN  5. Dyslipidemia Mr. Swanton is doing well s/p ICD. He is scheduled to go to inpatient rehab. He was provided post ICD instructions including wound care and activity restrictions. CXR pending. Device function normal.   Signed, EDMISTEN, BROOKE PA-C  I have seen, examined the patient, and reviewed the above assessment and plan.  Changes to above are made where necessary.  ICD interrogation today is reviewed and normal.  CXR is normal. No further EP evaluation planned.  Will see as needed while  here.  Co Sign: Hillis Range, MD 10/29/2012 2:04 PM

## 2012-11-12 NOTE — Progress Notes (Signed)
Patient arrived via wheelchair at 1410.  Wife and family at bedside.  Patient alert and oriented x3.  Patient oriented to the unit and room.  Patient verbalized understanding of safety plan and signed Fall Prevention Patient Safety Plan.  Denies pain.  Dressing intact to left chest.  Will continue to monitor.

## 2012-11-12 NOTE — Progress Notes (Signed)
Can admit pt to CIR today if medically cleared for d/c to CIR. [id number]

## 2012-11-12 NOTE — Plan of Care (Signed)
Overall Plan of Care [facility]) Patient Details Name: KREED KAUFFMAN MRN: [id number] DOB: Oct 07, 1932  Diagnosis:  Rehabilitation for deconditioning after cardiorespiratory arrest  Primary Diagnosis:    Physical deconditioning Co-morbidities: mild hypoxic encephalopathy, coronary artery disease  Functional Problem List  Patient demonstrates impairments in the following areas: Balance and Endurance  Basic ADL's: bathing, dressing and toileting Advanced ADL's: simple meal preparation  Transfers:  bed mobility, bed to chair, toilet, tub/shower, car and furniture Locomotion:  ambulation and stairs  Additional Impairments:  None  Anticipated Outcomes Item Anticipated Outcome  Eating/Swallowing  independent  Basic self-care  Independent with dressing and toileting, supervision with shower transfer  Tolieting  independent  Bowel/Bladder  Continent of bowel and bladder  Transfers  Indepenent  Locomotion   Modified independent/independent with ambulation/stairs   Communication  Mod I-I  Cognition  Mod I-I  Pain  </=2  Safety/Judgment  No falls with injury  Other     Therapy Plan: PT Intensity: Minimum of 1-2 x/day ,45 to 90 minutes PT Frequency: 5 out of 7 days PT Duration Estimated Length of Stay: 5 days PT Treatment/Interventions: Ambulation/gait training;Balance/vestibular training;Discharge planning;Functional mobility training;Patient/family education;Stair training;Therapeutic Activities;Therapeutic Exercise;UE/LE Strength taining/ROM OT Intensity: Minimum of 1-2 x/day, 45 to 90 minutes OT Frequency: 5 out of 7 days OT Duration/Estimated Length of Stay: 5 days OT Treatment/Interventions: Balance/vestibular training;Discharge planning;DME/adaptive equipment instruction;Functional mobility training;Patient/family education;Therapeutic Exercise;Therapeutic Activities;UE/LE Strength taining/ROM SLP Intensity: Minumum of 1-2 x/day, 30 to 90 minutes SLP Frequency: 5 out of  7 days SLP Duration/Estimated Length of Stay: 5 days SLP Treatment/Interventions: Cognitive remediation/compensation;Cueing hierarchy;Dysphagia/aspiration precaution training;Environmental controls;Functional tasks;Internal/external aids;Medication managment;Patient/family education;Therapeutic Activities    Therapy Recommendations: PT @([id number]::1)@ OT @([id number]::8)@ SLP  @([id number]::5)@  Team Interventions: Item RN PT OT SLP SW TR Other  Self Care/Advanced ADL Retraining   x      Neuromuscular Re-Education         Therapeutic Activities  X x      UE/LE Strength Training/ROM  X x      UE/LE Coordination Activities         Visual/Perceptual Remediation/Compensation         DME/Adaptive Equipment Instruction   x      Therapeutic Exercise  X x      Balance/Vestibular Training  X x      Patient/Family Education x X x x     Cognitive Remediation/Compensation    x     Functional Mobility Training  X x      Ambulation/Gait Training  X       Stair Training  X       Therapist, sports    x     Speech/Language Facilitation         Bladder Management x        Bowel Management x        Disease Management/Prevention x        Pain Management x        Medication Management x   x     Skin Care/Wound Management x        Splinting/Orthotics         Discharge Planning x X x      Psychosocial Support x  Team Discharge Planning: Destination:  Home Projected Follow-up:  Cardiac rehab; No other SLP needs Projected Equipment Needs:  TBA - may need RW for ambulation Patient/family involved in discharge planning:  Yes  MD ELOS: 7 days Medical Rehab Prognosis:  Good Assessment: 78 year old with cardiac arrest and mild hypoxic encephalopathy now requiring 24 7 rehabilitation M.D. And RN as well as CIR level PT OT and  SLP

## 2012-11-12 NOTE — Progress Notes (Signed)
Patient information reviewed and entered into eRehab system by Iszabella Hebenstreit, RN, CRRN, PPS Coordinator.  Information including medical coding and functional independence measure will be reviewed and updated through discharge.     Per nursing patient was given "Data Collection Information Summary for Patients in Inpatient Rehabilitation Facilities with attached "Privacy Act Statement-Health Care Records" upon admission.  

## 2012-11-12 NOTE — Discharge Summary (Signed)
1519  HGB 8.8* 9.1* 10.1*  recommendation  F/u cbc    Mild thrombocytopenia--->resolved   Lab  Dec 10, 2012 0445 10/30/2012 0500 11/18/2012 1519  PLT 498* 503* 531*    ?UTI.  Increased respiratory secretions, low grade temperature, persistent pulmonary infiltrates/ HCAP 12/11. Sputum demonstrated normal flora. Has been on levaquin since 12/11, and completed therapy on 12/17. His CXR continues to improve.  Recommendation F/u cxr   Hyperglycemia 2nd to acute stress.--->resolved.  CBG (last 3)  No results found for this basename: GLUCAP:3 in the last 72 hours    Acute encephalopathy 2nd to cardiac arrest-->resolved.  Admitted s/p cardiac arrest. Hypothermia protocol initiated. After re-warmed he did have significant agitation. Agitation much better with change to precedex 12/12. Much improved mental status 12/13>>continues to improve on daily basis.   Deconditioning Plan Needs CIR    Discharge Exam: BP 149/58  Pulse 84  Temp 98.4 F (36.9 C) (Oral)  Resp 19  Ht 5\' 11"  (1.803 m)  Wt 60.1 kg (132 lb 7.9 oz)  BMI 18.48 kg/m2  SpO2 94% 4 liters  PHYSICAL EXAMINATION:  General: No distress  Neuro: Calm, follows commands, moves all extremities, trouble with memory, speech improved  HEENT: No sinus tenderness  Cardiovascular: s1s2 no murmur  Lungs: Scattered rhonchi, improved and cl w/ cough  Abdomen: Soft, non tender, + bowel sounds  Musculoskeletal: No edema  Skin: no rashes   Labs at discharge Lab Results  Component Value Date   CREATININE 0.98 12-10-12   BUN 20 2012-12-10   NA 134* 2012/12/10   K 3.6 12-10-2012   CL 99 2012-12-10   CO2 29 12/10/12   Lab Results  Component Value Date   WBC 15.0* 12-10-2012   HGB 8.8* 2012-12-10   HCT 26.1* 12/10/12   MCV 89.1 2012-12-10   PLT 498* Dec 10, 2012   Lab Results  Component Value Date   ALT 93* 10/29/2012   AST 54* 10/30/2012   ALKPHOS 57 11/16/2012   BILITOT 0.7 11/19/2012   Lab Results  Component Value Date   INR 1.32 11/14/2012   INR 1.29 10/27/2012   INR 1.23 11/05/2012    Current radiology  studies Dg Chest Portable 1 View  12-10-12  *RADIOLOGY REPORT*  Clinical Data: Post pacemaker insertion  PORTABLE CHEST - 1 VIEW  Comparison: 11/14/2012  Findings: Cardiomediastinal silhouette is stable.  Status post median sternotomy.  There is single lead cardiac pacemaker with left subclavian approach with tip of the lead in the right ventricle.  No diagnostic pneumothorax.  Persistent streaky right lower lobe atelectasis or infiltrate.  Question small right pleural effusion.  IMPRESSION:  Status post median sternotomy.  There is single lead cardiac pacemaker with left subclavian approach with tip of the lead in the right ventricle.  No diagnostic pneumothorax.  Persistent streaky right lower lobe atelectasis or infiltrate.  Question small right pleural effusion.   Original Report Authenticated By: Natasha Mead, M.D.     Disposition:   CIR      Discharge Orders    Future Appointments: Provider: Department: Dept Phone: Center:   11/24/2012 4:00 PM [facility] [employer] Main Office [place]) [phone] [facility]   12/28/2012 8:15 AM Tresa Garter, MD [facility] [phone] [facility]   02/06/2013 9:45 AM Duke Salvia, MD [facility] Main Office [place]) [phone] [facility]       Medication List     As of December 10, 2012 11:47 AM    STOP taking these medications  1519  HGB 8.8* 9.1* 10.1*  recommendation  F/u cbc    Mild thrombocytopenia--->resolved   Lab  Dec 10, 2012 0445 10/30/2012 0500 10/26/2012 1519  PLT 498* 503* 531*    ?UTI.  Increased respiratory secretions, low grade temperature, persistent pulmonary infiltrates/ HCAP 12/11. Sputum demonstrated normal flora. Has been on levaquin since 12/11, and completed therapy on 12/17. His CXR continues to improve.  Recommendation F/u cxr   Hyperglycemia 2nd to acute stress.--->resolved.  CBG (last 3)  No results found for this basename: GLUCAP:3 in the last 72 hours    Acute encephalopathy 2nd to cardiac arrest-->resolved.  Admitted s/p cardiac arrest. Hypothermia protocol initiated. After re-warmed he did have significant agitation. Agitation much better with change to precedex 12/12. Much improved mental status 12/13>>continues to improve on daily basis.   Deconditioning Plan Needs CIR    Discharge Exam: BP 149/58  Pulse 84  Temp 98.4 F (36.9 C) (Oral)  Resp 19  Ht 5\' 11"  (1.803 m)  Wt 60.1 kg (132 lb 7.9 oz)  BMI 18.48 kg/m2  SpO2 94% 4 liters  PHYSICAL EXAMINATION:  General: No distress  Neuro: Calm, follows commands, moves all extremities, trouble with memory, speech improved  HEENT: No sinus tenderness  Cardiovascular: s1s2 no murmur  Lungs: Scattered rhonchi, improved and cl w/ cough  Abdomen: Soft, non tender, + bowel sounds  Musculoskeletal: No edema  Skin: no rashes   Labs at discharge Lab Results  Component Value Date   CREATININE 0.98 12-10-12   BUN 20 2012-12-10   NA 134* 2012/12/10   K 3.6 12-10-2012   CL 99 2012-12-10   CO2 29 12/10/12   Lab Results  Component Value Date   WBC 15.0* 12-10-2012   HGB 8.8* 2012-12-10   HCT 26.1* 12/10/12   MCV 89.1 2012-12-10   PLT 498* Dec 10, 2012   Lab Results  Component Value Date   ALT 93* 10/29/2012   AST 54* 11/19/2012   ALKPHOS 57 11/16/2012   BILITOT 0.7 11/19/2012   Lab Results  Component Value Date   INR 1.32 11/14/2012   INR 1.29 10/27/2012   INR 1.23 11/05/2012    Current radiology  studies Dg Chest Portable 1 View  12-10-12  *RADIOLOGY REPORT*  Clinical Data: Post pacemaker insertion  PORTABLE CHEST - 1 VIEW  Comparison: 11/14/2012  Findings: Cardiomediastinal silhouette is stable.  Status post median sternotomy.  There is single lead cardiac pacemaker with left subclavian approach with tip of the lead in the right ventricle.  No diagnostic pneumothorax.  Persistent streaky right lower lobe atelectasis or infiltrate.  Question small right pleural effusion.  IMPRESSION:  Status post median sternotomy.  There is single lead cardiac pacemaker with left subclavian approach with tip of the lead in the right ventricle.  No diagnostic pneumothorax.  Persistent streaky right lower lobe atelectasis or infiltrate.  Question small right pleural effusion.   Original Report Authenticated By: Natasha Mead, M.D.     Disposition:   CIR      Discharge Orders    Future Appointments: Provider: Department: Dept Phone: Center:   11/24/2012 4:00 PM [facility] [employer] Main Office [place]) [phone] [facility]   12/28/2012 8:15 AM Tresa Garter, MD [facility] [phone] [facility]   02/06/2013 9:45 AM Duke Salvia, MD [facility] Main Office [place]) [phone] [facility]       Medication List     As of December 10, 2012 11:47 AM    STOP taking these medications  1519  HGB 8.8* 9.1* 10.1*  recommendation  F/u cbc    Mild thrombocytopenia--->resolved   Lab  Dec 10, 2012 0445 10/30/2012 0500 11/18/2012 1519  PLT 498* 503* 531*    ?UTI.  Increased respiratory secretions, low grade temperature, persistent pulmonary infiltrates/ HCAP 12/11. Sputum demonstrated normal flora. Has been on levaquin since 12/11, and completed therapy on 12/17. His CXR continues to improve.  Recommendation F/u cxr   Hyperglycemia 2nd to acute stress.--->resolved.  CBG (last 3)  No results found for this basename: GLUCAP:3 in the last 72 hours    Acute encephalopathy 2nd to cardiac arrest-->resolved.  Admitted s/p cardiac arrest. Hypothermia protocol initiated. After re-warmed he did have significant agitation. Agitation much better with change to precedex 12/12. Much improved mental status 12/13>>continues to improve on daily basis.   Deconditioning Plan Needs CIR    Discharge Exam: BP 149/58  Pulse 84  Temp 98.4 F (36.9 C) (Oral)  Resp 19  Ht 5\' 11"  (1.803 m)  Wt 60.1 kg (132 lb 7.9 oz)  BMI 18.48 kg/m2  SpO2 94% 4 liters  PHYSICAL EXAMINATION:  General: No distress  Neuro: Calm, follows commands, moves all extremities, trouble with memory, speech improved  HEENT: No sinus tenderness  Cardiovascular: s1s2 no murmur  Lungs: Scattered rhonchi, improved and cl w/ cough  Abdomen: Soft, non tender, + bowel sounds  Musculoskeletal: No edema  Skin: no rashes   Labs at discharge Lab Results  Component Value Date   CREATININE 0.98 12-10-12   BUN 20 2012-12-10   NA 134* 2012/12/10   K 3.6 12-10-2012   CL 99 2012-12-10   CO2 29 12/10/12   Lab Results  Component Value Date   WBC 15.0* 12-10-2012   HGB 8.8* 2012-12-10   HCT 26.1* 12/10/12   MCV 89.1 2012-12-10   PLT 498* Dec 10, 2012   Lab Results  Component Value Date   ALT 93* 10/29/2012   AST 54* 10/30/2012   ALKPHOS 57 11/16/2012   BILITOT 0.7 11/19/2012   Lab Results  Component Value Date   INR 1.32 11/14/2012   INR 1.29 10/27/2012   INR 1.23 11/05/2012    Current radiology  studies Dg Chest Portable 1 View  12-10-12  *RADIOLOGY REPORT*  Clinical Data: Post pacemaker insertion  PORTABLE CHEST - 1 VIEW  Comparison: 11/14/2012  Findings: Cardiomediastinal silhouette is stable.  Status post median sternotomy.  There is single lead cardiac pacemaker with left subclavian approach with tip of the lead in the right ventricle.  No diagnostic pneumothorax.  Persistent streaky right lower lobe atelectasis or infiltrate.  Question small right pleural effusion.  IMPRESSION:  Status post median sternotomy.  There is single lead cardiac pacemaker with left subclavian approach with tip of the lead in the right ventricle.  No diagnostic pneumothorax.  Persistent streaky right lower lobe atelectasis or infiltrate.  Question small right pleural effusion.   Original Report Authenticated By: Natasha Mead, M.D.     Disposition:   CIR      Discharge Orders    Future Appointments: Provider: Department: Dept Phone: Center:   11/24/2012 4:00 PM [facility] [employer] Main Office [place]) [phone] [facility]   12/28/2012 8:15 AM Tresa Garter, MD [facility] [phone] [facility]   02/06/2013 9:45 AM Duke Salvia, MD [facility] Main Office [place]) [phone] [facility]       Medication List     As of December 10, 2012 11:47 AM    STOP taking these medications

## 2012-11-12 NOTE — H&P (Signed)
CC: Weakness  HPI: [name] Christensen is a 78 y.o. male with history of CAD who was admitted on 11/03/2012 witnessed arrest at home,presumedVF and CPR was started 10 minutes after cardiac arrest. Treated with arctic sun and started on IV heparin. Intubated for acute resiratory failure due to cardiac arrest and pulmonary edema. CT head negative for acuate changes. 2D echo with EF 40-45% with severe hypokinesis of basal to mid posterior and anterolateral walls. Treated with IV antibiotics for HCAP. Patient with decreased level of responsiveness and EEG of 12/13 showed severe slowing of cerebral activity c/w severe encephalopathy. He tolerated extubation 12/13 and D3 diet with nectar liquids recommended by ST. Cardiac cath on 12/17revealed widely patent graft. Post procedure, patient developed RLQ pain. U/S done without evidence of pseudoaneurysm or bleed.  ICD placed on 12/18 by Dr. Graciela Husbands. Device function normal per reports. Patient completed antibiotics for HCAP 12/17. Continue to have increased secretions with leucocytosis. ST upgraded diet to D3, thin liquids today. Therapy ongoing and the patient is deconditioned and requiring 50% venturi mask to maintain adequate oxygenation with activity. He is also noted to have impaired mentation with delayed processing. MD, PT, OT, ST recommending CIR and patient admitted today for progressive therapies.   Review of Systems  HENT: Negative for hearing loss and neck pain.  Eyes: Negative for blurred vision and double vision.  Respiratory: Negative for cough, shortness of breath and wheezing.  Cardiovascular: Positive for chest pain (diffuse soreness). Negative for palpitations.  Gastrointestinal: Negative for heartburn, nausea and constipation.  Genitourinary: Negative for urgency and frequency.  Hestiancy  Musculoskeletal: Negative for back pain and joint pain.  Neurological: Negative for headaches.  Psychiatric/Behavioral: Positive for memory loss. The patient has  insomnia.    Past Medical History   Diagnosis  Date   .  Coronary artery disease      s/p coronary bypass grafting 2005   .  GERD (gastroesophageal reflux disease)    .  Hyperlipidemia    .  Hypertension    .  BPH (benign prostatic hypertrophy)      elevated PSA Dr. Lindell Noe Bx 2010    Past Surgical History   Procedure  Date   .  Cardiac catheterization  2007     with patent graft anatomy atretic left internal mammary artery to the LAD which is nonobstructive. Will restart study June 08, 2007   .  Coronary artery bypass graft  2005   .  Lumbar fusion  09/2007    Family History   Problem  Relation  Age of Onset   .  Coronary artery disease  Other       male 1st degree relative<60    Social History: Married. Independent PTA. 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 Goes to the gym 6 days/wk. He reports that he has never smoked. He has never used smokeless tobacco. His alcohol and drug histories not on file.  Allergies   Allergen  Reactions   .  Amoxicillin      REACTION: unspecified   .  Ezetimibe      REACTION: numbness   .  Niacin      REACTION: CP   .  Penicillins    .  Pravastatin Sodium    .  Rosuvastatin      REACTION: aches    Medications Prior to Admission   Medication  Sig  Dispense  Refill   .  CC: Weakness  HPI: [name] Christensen is a 78 y.o. male with history of CAD who was admitted on 11/03/2012 witnessed arrest at home,presumedVF and CPR was started 10 minutes after cardiac arrest. Treated with arctic sun and started on IV heparin. Intubated for acute resiratory failure due to cardiac arrest and pulmonary edema. CT head negative for acuate changes. 2D echo with EF 40-45% with severe hypokinesis of basal to mid posterior and anterolateral walls. Treated with IV antibiotics for HCAP. Patient with decreased level of responsiveness and EEG of 12/13 showed severe slowing of cerebral activity c/w severe encephalopathy. He tolerated extubation 12/13 and D3 diet with nectar liquids recommended by ST. Cardiac cath on 12/17revealed widely patent graft. Post procedure, patient developed RLQ pain. U/S done without evidence of pseudoaneurysm or bleed.  ICD placed on 12/18 by Dr. Graciela Husbands. Device function normal per reports. Patient completed antibiotics for HCAP 12/17. Continue to have increased secretions with leucocytosis. ST upgraded diet to D3, thin liquids today. Therapy ongoing and the patient is deconditioned and requiring 50% venturi mask to maintain adequate oxygenation with activity. He is also noted to have impaired mentation with delayed processing. MD, PT, OT, ST recommending CIR and patient admitted today for progressive therapies.   Review of Systems  HENT: Negative for hearing loss and neck pain.  Eyes: Negative for blurred vision and double vision.  Respiratory: Negative for cough, shortness of breath and wheezing.  Cardiovascular: Positive for chest pain (diffuse soreness). Negative for palpitations.  Gastrointestinal: Negative for heartburn, nausea and constipation.  Genitourinary: Negative for urgency and frequency.  Hestiancy  Musculoskeletal: Negative for back pain and joint pain.  Neurological: Negative for headaches.  Psychiatric/Behavioral: Positive for memory loss. The patient has  insomnia.    Past Medical History   Diagnosis  Date   .  Coronary artery disease      s/p coronary bypass grafting 2005   .  GERD (gastroesophageal reflux disease)    .  Hyperlipidemia    .  Hypertension    .  BPH (benign prostatic hypertrophy)      elevated PSA Dr. Lindell Noe Bx 2010    Past Surgical History   Procedure  Date   .  Cardiac catheterization  2007     with patent graft anatomy atretic left internal mammary artery to the LAD which is nonobstructive. Will restart study June 08, 2007   .  Coronary artery bypass graft  2005   .  Lumbar fusion  09/2007    Family History   Problem  Relation  Age of Onset   .  Coronary artery disease  Other       male 1st degree relative<60    Social History: Married. Independent PTA. 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 Goes to the gym 6 days/wk. He reports that he has never smoked. He has never used smokeless tobacco. His alcohol and drug histories not on file.  Allergies   Allergen  Reactions   .  Amoxicillin      REACTION: unspecified   .  Ezetimibe      REACTION: numbness   .  Niacin      REACTION: CP   .  Penicillins    .  Pravastatin Sodium    .  Rosuvastatin      REACTION: aches    Medications Prior to Admission   Medication  Sig  Dispense  Refill   .  90 mL/min     GFR calc Af Amer  89 (*)  >90 mL/min    MAGNESIUM Status: Normal    Collection Time    11/02/2012 4:45 AM   Component  Value  Range  Comment    Magnesium  2.0  1.5 - 2.5 mg/dL    PHOSPHORUS Status: Normal    Collection Time    10/25/2012 4:45 AM   Component  Value  Range  Comment    Phosphorus  2.6  2.3 - 4.6 mg/dL    Dg Chest Portable 1 View  10/25/2012 *RADIOLOGY REPORT* Clinical Data: Post pacemaker insertion PORTABLE CHEST - 1 VIEW Comparison: 11/18/2012 Findings: Cardiomediastinal silhouette is stable. Status post median sternotomy. There is single lead cardiac pacemaker with left subclavian approach with tip of the lead in the right ventricle. No diagnostic pneumothorax. Persistent streaky right lower lobe atelectasis or infiltrate. Question small right pleural effusion. IMPRESSION: Status post median sternotomy. There is single lead cardiac pacemaker with left subclavian approach with tip of the lead in the right ventricle. No diagnostic pneumothorax. Persistent streaky right lower lobe atelectasis or infiltrate. Question small right pleural effusion. Original Report Authenticated By: Natasha Mead, M.D.   Post Admission Physician Evaluation:  1. Functional deficits secondary to Deconditioning following 11/09/2012 cardiac arrest, postoperative day #2 status post ICD placement. 2. Patient is admitted to receive collaborative, interdisciplinary care between the physiatrist, rehab nursing staff, and therapy team. 3. Patient's level of medical complexity and substantial therapy needs in context of that medical necessity cannot be provided at a lesser intensity of  care such as a SNF. 4. Patient has experienced substantial functional loss from his/her baseline which was documented above under the "Functional History" and "Functional Status" headings. Judging by the patient's diagnosis, physical exam, and functional history, the patient has potential for functional progress which will result in measurable gains while on inpatient rehab. These gains will be of substantial and practical use upon discharge in facilitating mobility and self-care at the household level. 5. Physiatrist will provide 24 hour management of medical needs as well as oversight of the therapy plan/treatment and provide guidance as appropriate regarding the interaction of the two. 6. 24 hour rehab nursing will assist with bladder management, bowel management, safety, skin/wound care, disease management, medication administration and patient education and help integrate therapy concepts, techniques,education, etc. 7. PT will assess and treat for: Pre-gait training, gait training, strengthening, range of motion, safety, equipment. Goals are: Modified independent mobility. 8. OT will assess and treat for: ADLs, cognitive perceptual skills, strengthening, range of motion, endurance, safety, equipment. Goals are: Modified independent with all ADLs. 9. SLP will assess and treat for: Assess higher-level cognition. Goals are: Higher-level cognitive tasks for home and garment such as checkbook. 10. Case Management and Social Worker will assess and treat for psychological issues and discharge planning. 11. Team conference will be held weekly to assess progress toward goals and to determine barriers to discharge. 12. Patient will receive at least 3 hours of therapy per day at least 5 days per week. 13. ELOS: 7 days Prognosis: excellent Medical Problem List and Plan:  1. DVT Prophylaxis/Anticoagulation: Pharmaceutical: Lovenox  2. Pain Management: Tylenol for pain related to ICD placement  3. Mood: Affect  is appropriate will monitor  4. Neuropsych: This patient Is capable of making decisions on his/her own behalf.  5. HTN: Monitor with bid checks.  6. BPH: reporting hesitancy. . Will resume uroxtral  7. Acute on chronic iron deficiency anemia:  CC: Weakness  HPI: [name] Christensen is a 78 y.o. male with history of CAD who was admitted on 11/03/2012 witnessed arrest at home,presumedVF and CPR was started 10 minutes after cardiac arrest. Treated with arctic sun and started on IV heparin. Intubated for acute resiratory failure due to cardiac arrest and pulmonary edema. CT head negative for acuate changes. 2D echo with EF 40-45% with severe hypokinesis of basal to mid posterior and anterolateral walls. Treated with IV antibiotics for HCAP. Patient with decreased level of responsiveness and EEG of 12/13 showed severe slowing of cerebral activity c/w severe encephalopathy. He tolerated extubation 12/13 and D3 diet with nectar liquids recommended by ST. Cardiac cath on 12/17revealed widely patent graft. Post procedure, patient developed RLQ pain. U/S done without evidence of pseudoaneurysm or bleed.  ICD placed on 12/18 by Dr. Graciela Husbands. Device function normal per reports. Patient completed antibiotics for HCAP 12/17. Continue to have increased secretions with leucocytosis. ST upgraded diet to D3, thin liquids today. Therapy ongoing and the patient is deconditioned and requiring 50% venturi mask to maintain adequate oxygenation with activity. He is also noted to have impaired mentation with delayed processing. MD, PT, OT, ST recommending CIR and patient admitted today for progressive therapies.   Review of Systems  HENT: Negative for hearing loss and neck pain.  Eyes: Negative for blurred vision and double vision.  Respiratory: Negative for cough, shortness of breath and wheezing.  Cardiovascular: Positive for chest pain (diffuse soreness). Negative for palpitations.  Gastrointestinal: Negative for heartburn, nausea and constipation.  Genitourinary: Negative for urgency and frequency.  Hestiancy  Musculoskeletal: Negative for back pain and joint pain.  Neurological: Negative for headaches.  Psychiatric/Behavioral: Positive for memory loss. The patient has  insomnia.    Past Medical History   Diagnosis  Date   .  Coronary artery disease      s/p coronary bypass grafting 2005   .  GERD (gastroesophageal reflux disease)    .  Hyperlipidemia    .  Hypertension    .  BPH (benign prostatic hypertrophy)      elevated PSA Dr. Lindell Noe Bx 2010    Past Surgical History   Procedure  Date   .  Cardiac catheterization  2007     with patent graft anatomy atretic left internal mammary artery to the LAD which is nonobstructive. Will restart study June 08, 2007   .  Coronary artery bypass graft  2005   .  Lumbar fusion  09/2007    Family History   Problem  Relation  Age of Onset   .  Coronary artery disease  Other       male 1st degree relative<60    Social History: Married. Independent PTA. 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 Goes to the gym 6 days/wk. He reports that he has never smoked. He has never used smokeless tobacco. His alcohol and drug histories not on file.  Allergies   Allergen  Reactions   .  Amoxicillin      REACTION: unspecified   .  Ezetimibe      REACTION: numbness   .  Niacin      REACTION: CP   .  Penicillins    .  Pravastatin Sodium    .  Rosuvastatin      REACTION: aches    Medications Prior to Admission   Medication  Sig  Dispense  Refill   .  90 mL/min     GFR calc Af Amer  89 (*)  >90 mL/min    MAGNESIUM Status: Normal    Collection Time    11/02/2012 4:45 AM   Component  Value  Range  Comment    Magnesium  2.0  1.5 - 2.5 mg/dL    PHOSPHORUS Status: Normal    Collection Time    10/25/2012 4:45 AM   Component  Value  Range  Comment    Phosphorus  2.6  2.3 - 4.6 mg/dL    Dg Chest Portable 1 View  10/25/2012 *RADIOLOGY REPORT* Clinical Data: Post pacemaker insertion PORTABLE CHEST - 1 VIEW Comparison: 11/18/2012 Findings: Cardiomediastinal silhouette is stable. Status post median sternotomy. There is single lead cardiac pacemaker with left subclavian approach with tip of the lead in the right ventricle. No diagnostic pneumothorax. Persistent streaky right lower lobe atelectasis or infiltrate. Question small right pleural effusion. IMPRESSION: Status post median sternotomy. There is single lead cardiac pacemaker with left subclavian approach with tip of the lead in the right ventricle. No diagnostic pneumothorax. Persistent streaky right lower lobe atelectasis or infiltrate. Question small right pleural effusion. Original Report Authenticated By: Natasha Mead, M.D.   Post Admission Physician Evaluation:  1. Functional deficits secondary to Deconditioning following 11/09/2012 cardiac arrest, postoperative day #2 status post ICD placement. 2. Patient is admitted to receive collaborative, interdisciplinary care between the physiatrist, rehab nursing staff, and therapy team. 3. Patient's level of medical complexity and substantial therapy needs in context of that medical necessity cannot be provided at a lesser intensity of  care such as a SNF. 4. Patient has experienced substantial functional loss from his/her baseline which was documented above under the "Functional History" and "Functional Status" headings. Judging by the patient's diagnosis, physical exam, and functional history, the patient has potential for functional progress which will result in measurable gains while on inpatient rehab. These gains will be of substantial and practical use upon discharge in facilitating mobility and self-care at the household level. 5. Physiatrist will provide 24 hour management of medical needs as well as oversight of the therapy plan/treatment and provide guidance as appropriate regarding the interaction of the two. 6. 24 hour rehab nursing will assist with bladder management, bowel management, safety, skin/wound care, disease management, medication administration and patient education and help integrate therapy concepts, techniques,education, etc. 7. PT will assess and treat for: Pre-gait training, gait training, strengthening, range of motion, safety, equipment. Goals are: Modified independent mobility. 8. OT will assess and treat for: ADLs, cognitive perceptual skills, strengthening, range of motion, endurance, safety, equipment. Goals are: Modified independent with all ADLs. 9. SLP will assess and treat for: Assess higher-level cognition. Goals are: Higher-level cognitive tasks for home and garment such as checkbook. 10. Case Management and Social Worker will assess and treat for psychological issues and discharge planning. 11. Team conference will be held weekly to assess progress toward goals and to determine barriers to discharge. 12. Patient will receive at least 3 hours of therapy per day at least 5 days per week. 13. ELOS: 7 days Prognosis: excellent Medical Problem List and Plan:  1. DVT Prophylaxis/Anticoagulation: Pharmaceutical: Lovenox  2. Pain Management: Tylenol for pain related to ICD placement  3. Mood: Affect  is appropriate will monitor  4. Neuropsych: This patient Is capable of making decisions on his/her own behalf.  5. HTN: Monitor with bid checks.  6. BPH: reporting hesitancy. . Will resume uroxtral  7. Acute on chronic iron deficiency anemia:  CC: Weakness  HPI: [name] Christensen is a 78 y.o. male with history of CAD who was admitted on 11/03/2012 witnessed arrest at home,presumedVF and CPR was started 10 minutes after cardiac arrest. Treated with arctic sun and started on IV heparin. Intubated for acute resiratory failure due to cardiac arrest and pulmonary edema. CT head negative for acuate changes. 2D echo with EF 40-45% with severe hypokinesis of basal to mid posterior and anterolateral walls. Treated with IV antibiotics for HCAP. Patient with decreased level of responsiveness and EEG of 12/13 showed severe slowing of cerebral activity c/w severe encephalopathy. He tolerated extubation 12/13 and D3 diet with nectar liquids recommended by ST. Cardiac cath on 12/17revealed widely patent graft. Post procedure, patient developed RLQ pain. U/S done without evidence of pseudoaneurysm or bleed.  ICD placed on 12/18 by Dr. Graciela Husbands. Device function normal per reports. Patient completed antibiotics for HCAP 12/17. Continue to have increased secretions with leucocytosis. ST upgraded diet to D3, thin liquids today. Therapy ongoing and the patient is deconditioned and requiring 50% venturi mask to maintain adequate oxygenation with activity. He is also noted to have impaired mentation with delayed processing. MD, PT, OT, ST recommending CIR and patient admitted today for progressive therapies.   Review of Systems  HENT: Negative for hearing loss and neck pain.  Eyes: Negative for blurred vision and double vision.  Respiratory: Negative for cough, shortness of breath and wheezing.  Cardiovascular: Positive for chest pain (diffuse soreness). Negative for palpitations.  Gastrointestinal: Negative for heartburn, nausea and constipation.  Genitourinary: Negative for urgency and frequency.  Hestiancy  Musculoskeletal: Negative for back pain and joint pain.  Neurological: Negative for headaches.  Psychiatric/Behavioral: Positive for memory loss. The patient has  insomnia.    Past Medical History   Diagnosis  Date   .  Coronary artery disease      s/p coronary bypass grafting 2005   .  GERD (gastroesophageal reflux disease)    .  Hyperlipidemia    .  Hypertension    .  BPH (benign prostatic hypertrophy)      elevated PSA Dr. Lindell Noe Bx 2010    Past Surgical History   Procedure  Date   .  Cardiac catheterization  2007     with patent graft anatomy atretic left internal mammary artery to the LAD which is nonobstructive. Will restart study June 08, 2007   .  Coronary artery bypass graft  2005   .  Lumbar fusion  09/2007    Family History   Problem  Relation  Age of Onset   .  Coronary artery disease  Other       male 1st degree relative<60    Social History: Married. Independent PTA. 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 Goes to the gym 6 days/wk. He reports that he has never smoked. He has never used smokeless tobacco. His alcohol and drug histories not on file.  Allergies   Allergen  Reactions   .  Amoxicillin      REACTION: unspecified   .  Ezetimibe      REACTION: numbness   .  Niacin      REACTION: CP   .  Penicillins    .  Pravastatin Sodium    .  Rosuvastatin      REACTION: aches    Medications Prior to Admission   Medication  Sig  Dispense  Refill   .  90 mL/min     GFR calc Af Amer  89 (*)  >90 mL/min    MAGNESIUM Status: Normal    Collection Time    11/02/2012 4:45 AM   Component  Value  Range  Comment    Magnesium  2.0  1.5 - 2.5 mg/dL    PHOSPHORUS Status: Normal    Collection Time    10/25/2012 4:45 AM   Component  Value  Range  Comment    Phosphorus  2.6  2.3 - 4.6 mg/dL    Dg Chest Portable 1 View  10/25/2012 *RADIOLOGY REPORT* Clinical Data: Post pacemaker insertion PORTABLE CHEST - 1 VIEW Comparison: 11/18/2012 Findings: Cardiomediastinal silhouette is stable. Status post median sternotomy. There is single lead cardiac pacemaker with left subclavian approach with tip of the lead in the right ventricle. No diagnostic pneumothorax. Persistent streaky right lower lobe atelectasis or infiltrate. Question small right pleural effusion. IMPRESSION: Status post median sternotomy. There is single lead cardiac pacemaker with left subclavian approach with tip of the lead in the right ventricle. No diagnostic pneumothorax. Persistent streaky right lower lobe atelectasis or infiltrate. Question small right pleural effusion. Original Report Authenticated By: Natasha Mead, M.D.   Post Admission Physician Evaluation:  1. Functional deficits secondary to Deconditioning following 11/09/2012 cardiac arrest, postoperative day #2 status post ICD placement. 2. Patient is admitted to receive collaborative, interdisciplinary care between the physiatrist, rehab nursing staff, and therapy team. 3. Patient's level of medical complexity and substantial therapy needs in context of that medical necessity cannot be provided at a lesser intensity of  care such as a SNF. 4. Patient has experienced substantial functional loss from his/her baseline which was documented above under the "Functional History" and "Functional Status" headings. Judging by the patient's diagnosis, physical exam, and functional history, the patient has potential for functional progress which will result in measurable gains while on inpatient rehab. These gains will be of substantial and practical use upon discharge in facilitating mobility and self-care at the household level. 5. Physiatrist will provide 24 hour management of medical needs as well as oversight of the therapy plan/treatment and provide guidance as appropriate regarding the interaction of the two. 6. 24 hour rehab nursing will assist with bladder management, bowel management, safety, skin/wound care, disease management, medication administration and patient education and help integrate therapy concepts, techniques,education, etc. 7. PT will assess and treat for: Pre-gait training, gait training, strengthening, range of motion, safety, equipment. Goals are: Modified independent mobility. 8. OT will assess and treat for: ADLs, cognitive perceptual skills, strengthening, range of motion, endurance, safety, equipment. Goals are: Modified independent with all ADLs. 9. SLP will assess and treat for: Assess higher-level cognition. Goals are: Higher-level cognitive tasks for home and garment such as checkbook. 10. Case Management and Social Worker will assess and treat for psychological issues and discharge planning. 11. Team conference will be held weekly to assess progress toward goals and to determine barriers to discharge. 12. Patient will receive at least 3 hours of therapy per day at least 5 days per week. 13. ELOS: 7 days Prognosis: excellent Medical Problem List and Plan:  1. DVT Prophylaxis/Anticoagulation: Pharmaceutical: Lovenox  2. Pain Management: Tylenol for pain related to ICD placement  3. Mood: Affect  is appropriate will monitor  4. Neuropsych: This patient Is capable of making decisions on his/her own behalf.  5. HTN: Monitor with bid checks.  6. BPH: reporting hesitancy. . Will resume uroxtral  7. Acute on chronic iron deficiency anemia:

## 2012-11-12 NOTE — Progress Notes (Signed)
Speech Language Pathology Dysphagia Treatment Patient Details Name: [name] Christensen MRN: [id number] DOB: Jun 05, 1932 Today's Date: 10/26/2012 Time: 2130-8657 SLP Time Calculation (min): 12 min  Assessment / Plan / Recommendation Clinical Impression  Pt has made improvements, demonstrating stonger, improved quality phonation, and overall improved toleration of thin liquids when consumed in smaller, controlled quantities and avoiding straws.  Recommend regular diet with thin liquids; meds whole with puree for now.  Reviewed with pt and dtr.  No further SLP f/u warranted.    Diet Recommendation  Initiate / Change Diet: Regular;Thin liquid    SLP Plan All goals met   Pertinent Vitals/Pain No pain   Swallowing Goals  SLP Swallowing Goals Patient will consume recommended diet without observed clinical signs of aspiration with: Supervision/safety met Patient will utilize recommended strategies during swallow to increase swallowing safety with: Supervision/safety met  General Temperature Spikes Noted: No Respiratory Status: Supplemental O2 delivered via (comment) Behavior/Cognition: Alert;Cooperative;Pleasant mood Oral Cavity - Dentition: Adequate natural dentition Patient Positioning: Upright in chair  Oral Cavity - Oral Hygiene Does patient have any of the following "at risk" factors?: Diet - patient on thickened liquids;Oxygen therapy - cannula, mask, simple oxygen devices   Dysphagia Treatment Treatment focused on: Skilled observation of diet tolerance Treatment Methods/Modalities: Skilled observation Patient observed directly with PO's: Yes Type of PO's observed: Regular;Thin liquids Feeding: Able to feed self Liquids provided via: Cup;Straw Pharyngeal Phase Signs & Symptoms: Delayed cough (after multiple boluses using straw; no coughing w/cup only) Type of cueing: Verbal Amount of cueing: Min initially; then sup - goals met   [name] Christensen L. [name] Christensen, [place] CCC/SLP Pager [phone]       [name] Christensen 11/11/2012, 11:26 AM

## 2012-11-12 NOTE — Op Note (Signed)
NAMETARIUS, [name] Christensen                ACCOUNT NO.:  [id number]  MEDICAL RECORD NO.:  [id number]  LOCATION:  [facility]                         FACILITY:  [facility]  PHYSICIAN:  Duke Salvia, MD, FACCDATE OF BIRTH:  August 16, 1932  DATE OF PROCEDURE:  11/04/2012 DATE OF DISCHARGE:                              OPERATIVE REPORT   PREOPERATIVE DIAGNOSIS:  Aborted cardiac arrest, ischemic heart disease without acute myocardial infarction.  POSTOPERATIVE DIAGNOSIS:  Aborted cardiac arrest, ischemic heart disease without acute myocardial infarction.  PROCEDURE:  ICD implantation with high voltage assessment without defibrillation threshold testing.  DESCRIPTION OF PROCEDURE:  After obtaining informed consent, the patient was brought to the electrophysiology laboratory and placed on the fluoroscopic table in supine position.  After routine prep and drape of the left upper chest, lidocaine was infiltrated in the prepectoral subclavicular region.  An incision was made and carried down to layer of the prepectoral fascia with electrocautery and sharp dissection.  A pocket was formed similarly.  Hemostasis was obtained.  Thereafter, attention was turned to gain access to the extrathoracic left subclavian vein, which was accomplished without difficulty.  A single wire was placed.  An 8-French sheath was placed and through this, we passed a [employer]. Jude [id number] single coil defibrillator lead, serial number [id number].  This was manipulated under fluoroscopy into the right ventricular apex where 3 sites were mapped before we finally got a place where the amplitude was 6.1 with a pace impedance of 643, a threshold 0.6 V, 0.5 milliseconds.  Current threshold 2.4 mA and there was no diaphragmatic pacing, 10 V current range was brisk.  The lead was secured to the prepectoral fascia and then attached to a St. Jude [id number] defibrillator, serial number [id number].  Through the device, bipolar R- wave was 9.2 with a pace  impedance of 690, threshold 0.2 and 0.5.  High- voltage impedance was 56 ohms.  The pocket was copiously irrigated with antibiotic-containing saline solution.  Hemostasis was assured.  The leads and pulse generator were placed in the pocket and secured to the prepectoral fascia.  The wound was closed in 3 layers in normal fashion. The wound was washed, dried, and benzoin Steri-Strip was applied. Needle counts, sponge counts, and instrument counts were correct at the end of the procedure according to staff.  The patient tolerated the procedure without apparent complication.     Duke Salvia, MD, [facility]     SCK/MEDQ  D:  11/08/2012  T:  11/02/2012  Job:  [id number]

## 2012-11-12 NOTE — Progress Notes (Signed)
Patient complaining that it burns when he urinates; requests to be put back on his urotrol.  Marissa Nestle, PA notified.  Patient has alfuzosin ordered per Marissa Nestle, PA.  Will continue to monitor.

## 2012-11-12 NOTE — Progress Notes (Signed)
Pt txing to rehab, report called to [place], family at bedside and moving with pt. VSS, no c/o

## 2012-11-13 ENCOUNTER — Inpatient Hospital Stay (HOSPITAL_COMMUNITY): Payer: Medicare Other

## 2012-11-13 ENCOUNTER — Inpatient Hospital Stay (HOSPITAL_COMMUNITY): Payer: Medicare Other | Admitting: Occupational Therapy

## 2012-11-13 ENCOUNTER — Inpatient Hospital Stay (HOSPITAL_COMMUNITY): Payer: Medicare Other | Admitting: Speech Pathology

## 2012-11-13 LAB — COMPREHENSIVE METABOLIC PANEL
ALT: 24 U/L (ref 0–53)
AST: 36 U/L (ref 0–37)
Albumin: 2 g/dL — ABNORMAL LOW (ref 3.5–5.2)
Alkaline Phosphatase: 96 U/L (ref 39–117)
BUN: 13 mg/dL (ref 6–23)
CO2: 29 mEq/L (ref 19–32)
Calcium: 9.5 mg/dL (ref 8.4–10.5)
Chloride: 100 mEq/L (ref 96–112)
Creatinine, Ser: 0.79 mg/dL (ref 0.50–1.35)
GFR calc Af Amer: >90 mL/min (ref >90)
GFR calc non Af Amer: 84 mL/min — ABNORMAL LOW (ref >90)
Glucose, Bld: 115 mg/dL — ABNORMAL HIGH (ref 70–99)
Potassium: 3.6 mEq/L (ref 3.5–5.1)
Sodium: 135 mEq/L (ref 135–145)
Total Bilirubin: 0.5 mg/dL (ref 0.3–1.2)
Total Protein: 6.4 g/dL (ref 6.0–8.3)

## 2012-11-13 LAB — CBC WITH DIFFERENTIAL/PLATELET
Basophils Absolute: 0 10*3/uL (ref 0.0–0.1)
Basophils Relative: 0 % (ref 0–1)
Eosinophils Absolute: 0.7 10*3/uL (ref 0.0–0.7)
Eosinophils Relative: 5 % (ref 0–5)
HCT: 27.9 % — ABNORMAL LOW (ref 39.0–52.0)
Hemoglobin: 9.1 g/dL — ABNORMAL LOW (ref 13.0–17.0)
Lymphocytes Relative: 15 % (ref 12–46)
Lymphs Abs: 1.8 10*3/uL (ref 0.7–4.0)
MCH: 29.4 pg (ref 26.0–34.0)
MCHC: 32.6 g/dL (ref 30.0–36.0)
MCV: 90.3 fL (ref 78.0–100.0)
Monocytes Absolute: 0.8 10*3/uL (ref 0.1–1.0)
Monocytes Relative: 6 % (ref 3–12)
Neutro Abs: 9.2 10*3/uL — ABNORMAL HIGH (ref 1.7–7.7)
Neutrophils Relative %: 73 % (ref 43–77)
Platelets: 504 10*3/uL — ABNORMAL HIGH (ref 150–400)
RBC: 3.09 MIL/uL — ABNORMAL LOW (ref 4.22–5.81)
RDW: 13.9 % (ref 11.5–15.5)
WBC: 12.6 10*3/uL — ABNORMAL HIGH (ref 4.0–10.5)

## 2012-11-13 MED ORDER — ATORVASTATIN CALCIUM 40 MG PO TABS
40.0000 mg | ORAL_TABLET | ORAL | Status: DC
  Administered 2012-11-15: 40 mg via ORAL
  Filled 2012-11-13 (×2): qty 1

## 2012-11-13 MED ORDER — ASPIRIN 81 MG PO CHEW
81.0000 mg | CHEWABLE_TABLET | Freq: Every day | ORAL | Status: DC
  Administered 2012-11-14 – 2012-11-16 (×3): 81 mg via ORAL
  Filled 2012-11-13 (×3): qty 1

## 2012-11-13 NOTE — Evaluation (Signed)
Speech Language Pathology Assessment and Plan  Patient Details  Name: [name] Christensen MRN: [id number] Date of Birth: 1932/11/01  SLP Diagnosis: Dysphagia;Cognitive Impairments  Rehab Potential: Good ELOS: 5 days   Today's Date: 11/13/2012 Time: 4742-5956 Time Calculation (min): 50 min  Problem List:  Patient Active Problem List  Diagnosis  . HYPERLIPIDEMIA  . INSOMNIA, PERSISTENT  . HYPERTENSION  . CORONARY ARTERY DISEASE  . GERD  . BENIGN PROSTATIC HYPERTROPHY  . BACK PAIN  . MUSCLE PAIN  . CORONARY ARTERY BYPASS GRAFT, HX OF  . PALPITATIONS  . CAROTID BRUIT  . Well adult exam  . Hyperkalemia  . Sudden cardiac death (resolved)  . Acute respiratory failure (resolved)   . Pneumonia  . HCAP (healthcare-associated pneumonia)  . Encephalopathy acute (resolved)  . VF (ventricular fibrillation) s/p ICD on 12/18   . Hyponatremia  . Physical deconditioning   Past Medical History:  Past Medical History  Diagnosis Date  . Coronary artery disease     s/p coronary bypass grafting 2005  . GERD (gastroesophageal reflux disease)   . Hyperlipidemia   . Hypertension   . BPH (benign prostatic hypertrophy)     elevated PSA Dr. Lindell Noe Bx 2010   Past Surgical History:  Past Surgical History  Procedure Date  . Cardiac catheterization 2007    with patent graft anatomy atretic left internal mammary  artery to the LAD which is nonobstructive. Will restart study June 08, 2007  . Coronary artery bypass graft 2005  . Lumbar fusion 09/2007    Assessment / Plan / Recommendation Clinical Impression  [name] Christensen is a 78 y.o. male with history of CAD who was admitted on 11/17/2012 witnessed arrest at home, presumed VF and CPR was started 10 minutes after cardiac arrest. Treated with arctic sun and started on IV heparin. Intubated for acute respiratory failure due to cardiac arrest and pulmonary edema. CT head negative for acute changes. Patient with decreased level of responsiveness  and EEG of 12/13 showed severe slowing of cerebral activity c/w severe encephalopathy. He tolerated extubation 12/13 and D3 diet with nectar liquids recommended by ST; upgraded diet to D3, thin liquids 10/30/2012. Therapy ongoing and the patient is deconditioned and requiring 2L O2 via nasal cannula.  He is also noted to have impaired mentation with delayed processing. Patient transferred to [name] 11/22/2012 and upon evaluation today presents with [name] swallow; however, given hospital course and recent upgrade SLP will monitor for toleration 1-2 days.  Patient also presents with overall modified independence with processing and trace decreased recall of new information; as a result, SLP recommends skilled intervention to educate on compensatory strategies for recall and assess it with carryover of new information during diagnostic therapy sessions. Patient was independent prior to admission and discharge goals are for mod I-I for cognitive-linguistic skills and swallowing.      SLP Assessment  Patient will need skilled Speech Lanaguage Pathology Services during CIR admission    Recommendations  Follow up Recommendations: None Equipment Recommended: None recommended by SLP    SLP Frequency 5 out of 7 days   SLP Treatment/Interventions Cognitive remediation/compensation;Cueing hierarchy;Dysphagia/aspiration precaution training;Environmental controls;Functional tasks;Internal/external aids;Medication managment;Patient/family education;Therapeutic Activities    Skilled Theraputic Intervention: SLP facilitated session with questions regarding new/current medications.  Patient was unable to recall all recent medication changes and as a result SLP provided a medication list/chart for patient to utilize to familiarize himself with medications, functions and frequencies.   Pain Pain Assessment Pain Assessment: No/denies pain Prior  Functioning Cognitive/Linguistic Baseline: Within functional limits  Short Term  Goals: Week 1: SLP Short Term Goal 1 (Week 1): Short term goals equal long term goals  See FIM for current functional status Refer to Care Plan for Long Term Goals  Recommendations for other services: None  Discharge Criteria: Patient will be discharged from SLP if patient refuses treatment 3 consecutive times without medical reason, if treatment goals not met, if there is a change in medical status, if patient makes no progress towards goals or if patient is discharged from hospital.  The above assessment, treatment plan, treatment alternatives and goals were discussed and mutually agreed upon: by patient and by family  Charlane Ferretti., CCC-SLP [phone]  [name] Christensen 11/13/2012, 5:01 PM

## 2012-11-13 NOTE — Progress Notes (Signed)
Social Work Assessment and Plan Social Work Assessment and Plan  Patient Details  Name: [name] Christensen MRN: [id number] Date of Birth: June 22, 1932  Today's Date: 11/13/2012  Problem List:  Patient Active Problem List  Diagnosis  . HYPERLIPIDEMIA  . INSOMNIA, PERSISTENT  . HYPERTENSION  . CORONARY ARTERY DISEASE  . GERD  . BENIGN PROSTATIC HYPERTROPHY  . BACK PAIN  . MUSCLE PAIN  . CORONARY ARTERY BYPASS GRAFT, HX OF  . PALPITATIONS  . CAROTID BRUIT  . Well adult exam  . Hyperkalemia  . Sudden cardiac death (resolved)  . Acute respiratory failure (resolved)   . Pneumonia  . HCAP (healthcare-associated pneumonia)  . Encephalopathy acute (resolved)  . VF (ventricular fibrillation) s/p ICD on 12/18   . Hyponatremia  . Physical deconditioning   Past Medical History:  Past Medical History  Diagnosis Date  . Coronary artery disease     s/p coronary bypass grafting 2005  . GERD (gastroesophageal reflux disease)   . Hyperlipidemia   . Hypertension   . BPH (benign prostatic hypertrophy)     elevated PSA Dr. Lindell Noe Bx 2010   Past Surgical History:  Past Surgical History  Procedure Date  . Cardiac catheterization 2007    with patent graft anatomy atretic left internal mammary  artery to the LAD which is nonobstructive. Will restart study June 08, 2007  . Coronary artery bypass graft 2005  . Lumbar fusion 09/2007   Social History:  reports that he has never smoked. He has never used smokeless tobacco. His alcohol and drug histories not on file.  Family / Support Systems Marital Status: Married Patient Roles: Spouse;Parent;Other (Comment) (Employee) Spouse/Significant Other: Kathie Rhodes [phone]  [phone]-cell Children: Son Other Supports: Numerous family members who are supportive Anticipated Caregiver: Wife Ability/Limitations of Caregiver: Wife can provide supervision level-pt is high level will only need supervision Caregiver Availability: 24/7 Family Dynamics:  Close knit family who pulls together when one of them is in need.  Pt reports he has always been independent and plans to remain so.  He has numerous family members in the room now.  Social History Preferred language: English Religion:  Cultural Background: No issues Education: Automotive engineer Educated Read: Yes Write: Yes Employment Status: Employed Name of Employer: Scientist, product/process development Return to Work Plans: Would like to return enjoys it Fish farm manager Issues: No issues Guardian/Conservator: None-according to MD pt is capable of making his own decisions   Abuse/Neglect Physical Abuse: Denies Verbal Abuse: Denies Sexual Abuse: Denies Exploitation of patient/patient's resources: Denies Self-Neglect: Denies  Emotional Status Pt's affect, behavior adn adjustment status: Pt is motivated and pushing himself to do well and be home by Christmas.  This is his goal and he plans on reaching this.  He is a very driven man who is extremely active and plans to remain so. Recent Psychosocial Issues: Other medical issues Pyschiatric History: No hisotry-deferred depression screen due to doing well and numerous family members in the room.  Will monitor throughout his stay. Substance Abuse History: No issues  Patient / Family Perceptions, Expectations & Goals Pt/Family understanding of illness & functional limitations: Pt is able to explain his heart issues and need for a pacemaker.  He feels much better and is ready to become active again.  He wants to be home by Christmas Eve.  He wants to get stronger so he doe not need oxygen. Premorbid pt/family roles/activities: Husband, Father, Employee, Home owner, Church member, etc Anticipated changes in roles/activities/participation: resume Pt/family expectations/goals: Pt states: "  I want to be home by Christmas and plan to get myself at a independent level."  He is doing extremely well in his therapies thus far.  Sister's who are here are  surprised but know their brother.  Community Resources [employer]: None Premorbid Home Care/DME Agencies: None Transportation available at discharge: Family Resource referrals recommended: Support group (specify)  Discharge Planning Living Arrangements: Spouse/significant other Support Systems: Spouse/significant other;Children;Other relatives;Friends/neighbors;Church/faith community Type of Residence: Private residence Insurance Resources: Administrator (specify) ([employer]) Financial Resources: Employment;Social Security Financial Screen Referred: No Living Expenses: Lives with family Money Management: Patient;Spouse Do you have any problems obtaining your medications?: No Home Management: Wife Patient/Family Preliminary Plans: Retrun home with wife who can be there with him.  He plans to be home a short time then back to work and the gym.  Aware therapist has recommended Cardiac Rehab so he can be monitored while he is exercising.  He wnet eight years ago after open heart surgery, so is familar with the program. Social Work Anticipated Follow Up Needs: Other (comment) (Cardiac rehab) DC Planning Additional Notes/Comments: Short length of stay high level  Clinical Impression Very driven gentleman who is very active and plans to remain so.  Supportive wife who can be there to assist.  Should be ready to go home by Christmas,according to team, check with MD regarding medical issues.   Lucy Chris 11/13/2012, 12:29 PM

## 2012-11-13 NOTE — Care Management Note (Signed)
[facility]patient [facility] Individual Statement of Services  Patient Name:  [name] Christensen  Date:  11/13/2012  Welcome to the [facility]patient [facility].  Our goal is to provide you with an individualized program based on your diagnosis and situation, designed to meet your specific needs.  With this comprehensive rehabilitation program, you will be expected to participate in at least 3 hours of rehabilitation therapies Monday-Friday, with modified therapy programming on the weekends.  Your rehabilitation program will include the following services:  Physical Therapy (PT), Occupational Therapy (OT), Speech Therapy (ST), 24 hour per day rehabilitation nursing, Case Management (RN and Social Worker), Rehabilitation Medicine, Nutrition Services and Pharmacy Services  Weekly team conferences will be held on Wednesday to discuss your progress.  Your RN Case Designer, television/film set will talk with you frequently to get your input and to update you on team discussions.  Team conferences with you and your family in attendance may also be held.  Expected length of stay: 5 DAYS  Overall anticipated outcome: MOD/I -SUPERVISION LEVEL  Depending on your progress and recovery, your program may change.  Your RN Case Estate agent will coordinate services and will keep you informed of any changes.  Your RN Sports coach and SW names and contact numbers are listed  below.  The following services may also be recommended but are not provided by the Inpatient [facility] Center:   Driving Evaluations  Home Health Rehabiltiation Services  [facility] [place]  Vocational Rehabilitation   Arrangements will be made to provide these services after discharge if needed.  Arrangements include referral to agencies that provide these services.  Your insurance has been verified to be:  Medicare & [employer] Your primary doctor is:  Dr [employer]  Pertinent information will  be shared with your doctor and your insurance company.   Social Worker:  Dossie Der, [place] [phone]  Information discussed with and copy given to patient by: Lucy Chris, 11/13/2012, 8:51 AM

## 2012-11-13 NOTE — Progress Notes (Signed)
Patient ID: [name] Christensen, male   DOB: 29-Jun-1932, 78 y.o.   MRN: [id number]  Subjective/Complaints: 78 y.o. male with history of CAD who was admitted on 10/28/2012 witnessed arrest at home,presumedVF and CPR was started 10 minutes after cardiac arrest. Treated started on IV heparin. Intubated for acute resiratory failure due to cardiac arrest and pulmonary edema. CT head negative for acuate changes. 2D echo with EF 40-45% with severe hypokinesis of basal to mid posterior and anterolateral walls. Treated with IV antibiotics for HCAP. Patient with decreased level of responsiveness and EEG of 12/13 showed severe slowing of cerebral activity c/w severe encephalopathy. He tolerated extubation 12/13 and D3 diet with nectar liquids recommended by ST. Cardiac cath on 12/17revealed widely patent graft. Post procedure, patient developed RLQ pain. U/S done without evidence of pseudoaneurysm or bleed.  ICD placed on 12/18 by Dr. Graciela Husbands. Device function normal per reports. Patient completed antibiotics for HCAP 12/17. Continue to have increased secretions with leucocytosis. ST upgraded DIII  Review of Systems  Respiratory: Positive for shortness of breath.        With activity  All other systems reviewed and are negative.    Objective: Vital Signs: Blood pressure 153/68, pulse 84, temperature 98.2 F (36.8 C), temperature source Oral, resp. rate 18, height 5\' 8"  (1.727 m), weight 66.134 kg (145 lb 12.8 oz), SpO2 95.00%. Dg Chest Portable 1 View  11/18/2012  *RADIOLOGY REPORT*  Clinical Data: Post pacemaker insertion  PORTABLE CHEST - 1 VIEW  Comparison: 11/24/2012  Findings: Cardiomediastinal silhouette is stable.  Status post median sternotomy.  There is single lead cardiac pacemaker with left subclavian approach with tip of the lead in the right ventricle.  No diagnostic pneumothorax.  Persistent streaky right lower lobe atelectasis or infiltrate.  Question small right pleural effusion.  IMPRESSION:  Status  post median sternotomy.  There is single lead cardiac pacemaker with left subclavian approach with tip of the lead in the right ventricle.  No diagnostic pneumothorax.  Persistent streaky right lower lobe atelectasis or infiltrate.  Question small right pleural effusion.   Original Report Authenticated By: Natasha Mead, M.D.    Results for orders placed during the hospital encounter of 11/15/2012 (from the past 72 hour(s))  CBC WITH DIFFERENTIAL     Status: Abnormal   Collection Time   11/13/12  6:25 AM      Component Value Range Comment   WBC 12.6 (*) 4.0 - 10.5 K/uL    RBC 3.09 (*) 4.22 - 5.81 MIL/uL    Hemoglobin 9.1 (*) 13.0 - 17.0 g/dL    HCT 40.9 (*) 81.1 - 52.0 %    MCV 90.3  78.0 - 100.0 fL    MCH 29.4  26.0 - 34.0 pg    MCHC 32.6  30.0 - 36.0 g/dL    RDW 91.4  78.2 - 95.6 %    Platelets 504 (*) 150 - 400 K/uL    Neutrophils Relative 73  43 - 77 %    Neutro Abs 9.2 (*) 1.7 - 7.7 K/uL    Lymphocytes Relative 15  12 - 46 %    Lymphs Abs 1.8  0.7 - 4.0 K/uL    Monocytes Relative 6  3 - 12 %    Monocytes Absolute 0.8  0.1 - 1.0 K/uL    Eosinophils Relative 5  0 - 5 %    Eosinophils Absolute 0.7  0.0 - 0.7 K/uL    Basophils Relative 0  0 - 1 %  Basophils Absolute 0.0  0.0 - 0.1 K/uL   COMPREHENSIVE METABOLIC PANEL     Status: Abnormal   Collection Time   11/13/12  6:25 AM      Component Value Range Comment   Sodium 135  135 - 145 mEq/L    Potassium 3.6  3.5 - 5.1 mEq/L    Chloride 100  96 - 112 mEq/L    CO2 29  19 - 32 mEq/L    Glucose, Bld 115 (*) 70 - 99 mg/dL    BUN 13  6 - 23 mg/dL    Creatinine, Ser 2.95  0.50 - 1.35 mg/dL    Calcium 9.5  8.4 - 62.1 mg/dL    Total Protein 6.4  6.0 - 8.3 g/dL    Albumin 2.0 (*) 3.5 - 5.2 g/dL    AST 36  0 - 37 U/L    ALT 24  0 - 53 U/L    Alkaline Phosphatase 96  39 - 117 U/L    Total Bilirubin 0.5  0.3 - 1.2 mg/dL    GFR calc non Af Amer 84 (*) >90 mL/min    GFR calc Af Amer >90  >90 mL/min      HEENT: normal Cardio: tachy Resp:  CTA B/L GI: BS positive Extremity:  No Edema Skin:   Intact Neuro: Alert/Oriented, Cranial Nerve II-XII normal, Normal Sensory and Abnormal Motor 4/5 in BUE and BLE Musc/Skel:  Normal GEN: NAD   Assessment/Plan: 1. Functional deficits secondary to Deconditioning following V fib arrest with mild hypoxic encephalopathy which require 3+ hours per day of interdisciplinary therapy in a comprehensive inpatient rehab setting. Physiatrist is providing close team supervision and 24 hour management of active medical problems listed below. Physiatrist and rehab team continue to assess barriers to discharge/monitor patient progress toward functional and medical goals. FIM:                   Comprehension Comprehension Mode: Auditory Comprehension: 5-Follows basic conversation/direction: With no assist  Expression Expression Mode: Verbal Expression: 5-Expresses basic needs/ideas: With no assist  Social Interaction Social Interaction: 5-Interacts appropriately 90% of the time - Needs monitoring or encouragement for participation or interaction.  Problem Solving Problem Solving: 5-Solves basic 90% of the time/requires cueing < 10% of the time  Memory Memory: 5-Recognizes or recalls 90% of the time/requires cueing < 10% of the time   Medical Problem List and Plan:  1. DVT Prophylaxis/Anticoagulation: Pharmaceutical: Lovenox  2. Pain Management: Tylenol for pain related to ICD placement  3. Mood: Affect is appropriate will monitor  4. Neuropsych: This patient Is capable of making decisions on his/her own behalf.  5. HTN: Monitor with bid checks.  6. BPH: reporting hesitancy. . Will resume uroxtral  7. Acute on chronic iron deficiency anemia: Hgb 11.8 at admission to 8.8 currently. Will add iron supplement.  8. Hyponatremia: Likely dilutional. Recheck in am.  9. VF cardiac arrest: s/p ICD placement 12/18. Pacer precautions.  10. Right flank pain: Now limited to activity.Patient  points to the inguinal area which is where he was catheterized. Small amount of bruising Will monitor.  11. Leucocytosis: UCS 12/17 without growth.    LOS (Days) 1 A FACE TO FACE EVALUATION WAS PERFORMED  Aleigha Gilani E 11/13/2012, 8:15 AM

## 2012-11-13 NOTE — Progress Notes (Signed)
Occupational Therapy Session Note  Patient Details  Name: [name] Christensen MRN: [id number] Date of Birth: 06/10/1932  Today's Date: 11/13/2012 Time: 1330-1400 Time Calculation (min): 30 min  Short Term Goals: Week 1:    STG = LTGs due to short LOS  Skilled Therapeutic Interventions/Progress Updates:    Pt seen for 1:1 OT with focus on functional mobility in ADL apartment and completing walk-in shower transfer.  Pt ambulated with RW and close supervision to ADL apartment > 150 feet with 1 standing rest break.  Assessed pt's vitals upon arrival HR: 103 and O2: 92 (on 2L).  Pt completed bed making without RW with bending to retrieve pillows from floor without any LOB with close supervision.  After this task pt's HR: 112 and O2: 93.  Pt rested approx 3-4 mins prior to completing simulated walk-in shower transfer.  Pt reports 2-3" ledge to step over into walk-in shower, simulated with close supervision with pt reporting a grab bar in shower to assist with safety with transfer.  Pt ambulated back to room with RW and returned to recliner with call bell and phone in reach and family present.  Therapy Documentation Precautions:  Precautions Precautions: Fall;ICD/Pacemaker Precaution Comments: oxygen Restrictions Weight Bearing Restrictions: No LUE Weight Bearing:  (Minimize movement) Other Position/Activity Restrictions: Pt reports that he should not raise left arm over head  General:   Vital Signs: Oxygen Therapy SpO2: 98 % O2 Device: Nasal cannula O2 Flow Rate (L/min): 2 L/min Pulse Oximetry Type: Intermittent Pain: Pain Assessment Pain Assessment: No/denies pain  See FIM for current functional status  Therapy/Group: Individual Therapy  Leonette Monarch 11/13/2012, 2:59 PM

## 2012-11-13 NOTE — Evaluation (Signed)
Occupational Therapy Assessment and Plan  Patient Details  Name: [name] Christensen MRN: [id number] Date of Birth: 1932-09-11  OT Diagnosis: muscle weakness (generalized) Rehab Potential: Rehab Potential: Good ELOS: 5 days   Today's Date: 11/13/2012 Time: 0805-0900 Time Calculation (min): 55 min  1:1 Pt seen for initial evaluation and ADL retraining of Bathing and toileting. Pt did not have clothing available at time of eval but was able to doff and don socks without difficulty. Pt ambulated to bathroom with hand held assist.  His heart rate increases, but his O2 levels did not decrease.  Pt stated that his goal is to go home walking without equipment.  Pt demonstrated fair activity tolerance during the session.  Problem List:  Patient Active Problem List  Diagnosis  . HYPERLIPIDEMIA  . INSOMNIA, PERSISTENT  . HYPERTENSION  . CORONARY ARTERY DISEASE  . GERD  . BENIGN PROSTATIC HYPERTROPHY  . BACK PAIN  . MUSCLE PAIN  . CORONARY ARTERY BYPASS GRAFT, HX OF  . PALPITATIONS  . CAROTID BRUIT  . Well adult exam  . Hyperkalemia  . Sudden cardiac death (resolved)  . Acute respiratory failure (resolved)   . Pneumonia  . HCAP (healthcare-associated pneumonia)  . Encephalopathy acute (resolved)  . VF (ventricular fibrillation) s/p ICD on 12/18   . Hyponatremia  . Physical deconditioning    Past Medical History:  Past Medical History  Diagnosis Date  . Coronary artery disease     s/p coronary bypass grafting 2005  . GERD (gastroesophageal reflux disease)   . Hyperlipidemia   . Hypertension   . BPH (benign prostatic hypertrophy)     elevated PSA Dr. Lindell Noe Bx 2010   Past Surgical History:  Past Surgical History  Procedure Date  . Cardiac catheterization 2007    with patent graft anatomy atretic left internal mammary  artery to the LAD which is nonobstructive. Will restart study June 08, 2007  . Coronary artery bypass graft 2005  . Lumbar fusion 09/2007    Assessment &  Plan Clinical Impression: [name] Christensen is a 78 y.o. male with history of CAD who was admitted on 11/15/2012 witnessed arrest at home,presumedVF and CPR was started 10 minutes after cardiac arrest. Treated with arctic sun and started on IV heparin. Intubated for acute resiratory failure due to cardiac arrest and pulmonary edema. CT head negative for acuate changes. 2D echo with EF 40-45% with severe hypokinesis of basal to mid posterior and anterolateral walls. Treated with IV antibiotics for HCAP. Patient with decreased level of responsiveness and EEG of 12/13 showed severe slowing of cerebral activity c/w severe encephalopathy. He tolerated extubation 12/13 and D3 diet with nectar liquids recommended by ST. Cardiac cath on 12/17revealed widely patent graft. Post procedure, patient developed RLQ pain. U/S done without evidence of pseudoaneurysm or bleed.   ICD placed on 12/18 by Dr. Graciela Husbands. Device function normal per reports. Patient completed antibiotics for HCAP 12/17. Continue to have increased secretions with leucocytosis. ST upgraded diet to D3, thin liquids today. Therapy ongoing and the patient is deconditioned and requiring 50% venturi mask to maintain adequate oxygenation with activity. He is also noted to have impaired mentation with delayed processing    Patient transferred to CIR on 10/26/2012 .    Patient currently requires supervision with basic self-care skills secondary to muscle weakness, decreased cardiorespiratoy endurance and decreased standing balance.  Prior to hospitalization, patient was fully independent and working full time. Patient will benefit from skilled intervention to increase independence with basic  self-care skills and increase level of independence with iADL prior to discharge home with care partner.  Anticipate patient will require intermittent supervision and no further OT follow recommended.  OT - End of Session Activity Tolerance: Tolerates 10 - 20 min activity with  multiple rests Endurance Deficit: Yes Endurance Deficit Description: heart rate increases to 130 bpm with small amounts of activity in standing OT Assessment Rehab Potential: Good OT Plan OT Intensity: Minimum of 1-2 x/day, 45 to 90 minutes OT Frequency: 5 out of 7 days OT Duration/Estimated Length of Stay: 5 days OT Treatment/Interventions: Balance/vestibular training;Discharge planning;DME/adaptive equipment instruction;Functional mobility training;Patient/family education;Therapeutic Exercise;Therapeutic Activities;UE/LE Strength taining/ROM OT Recommendation Follow Up Recommendations: None Equipment Recommended: None recommended by OT  OT Evaluation Precautions/Restrictions  Precautions Precautions: Fall Restrictions Weight Bearing Restrictions: No Other Position/Activity Restrictions: Pt reports that he should not raise left arm over head  General Chart Reviewed: Yes Family/Caregiver Present: No Vital Signs Therapy Vitals Temp: 98.2 F (36.8 C) Temp src: Oral Pulse Rate: 84  Resp: 18  BP: 153/68 mmHg Patient Position, if appropriate: Lying Oxygen Therapy SpO2: 94 % O2 Device: Nasal cannula O2 Flow Rate (L/min): 2 L/min Pulse Oximetry Type: Intermittent Pain Pain Assessment Pain Assessment: No/denies pain Pain Score: 0-No pain Home Living/Prior Functioning Home Living Lives With: Spouse Available Help at Discharge: Family Type of Home: House Home Access: Stairs to enter [employer] of Steps: 4 Home Layout: Two level;Able to live on main level with bedroom/bathroom Bathroom Shower/Tub: Health visitor: Handicapped height Home Adaptive Equipment: None Prior Function Level of Independence: Independent with basic ADLs;Independent with homemaking with ambulation;Independent with transfers;Independent with gait Able to Take Stairs?: Yes Driving: Yes Vocation: Full time 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[employer] ADL Supervision with basic self care, min assist with ADL transfers   Vision/Perception  Vision - History Baseline Vision: Wears glasses all the time Patient Visual Report: No change from baseline Vision - Assessment Eye Alignment: Within Functional Limits Perception Perception: Within Functional Limits Praxis Praxis: Intact  Cognition Overall Cognitive Status: Appears within functional limits for tasks assessed Arousal/Alertness: Awake/alert Orientation Level: Oriented X4 Memory: Appears intact Awareness: Appears intact Sensation Sensation Light Touch: Appears Intact Stereognosis: Appears Intact Hot/Cold: Appears Intact Proprioception: Appears Intact Coordination Gross Motor Movements are Fluid and Coordinated: Yes Fine Motor Movements are Fluid and Coordinated: Yes Motor  Motor Motor - Skilled Clinical Observations: Generalized muscle weakness and deconditioning Mobility  Hand held assist with standing and ambulating to the bathroom.    Trunk/Postural Assessment  Cervical Assessment Cervical Assessment: Within Functional Limits Thoracic Assessment Thoracic Assessment: Within Functional Limits Lumbar Assessment Lumbar Assessment: Within Functional Limits Postural Control Postural Control: Within Functional Limits  Balance Balance Balance Assessed: Yes Static Sitting Balance Static Sitting - Level of Assistance: 7: Independent Static Standing Balance Static Standing - Level of Assistance: 5: Stand by assistance Dynamic Standing Balance Dynamic Standing - Level of Assistance: 4: Min assist Extremity/Trunk Assessment RUE Assessment RUE Assessment: Within Functional Limits LUE Assessment LUE Assessment: Within Functional Limits (pt should not raise arm overhead at this time)  See FIM for current functional status Refer to Care Plan for Long Term Goals  Recommendations for other services: None  Discharge Criteria: Patient will be discharged from OT  if patient refuses treatment 3 consecutive times without medical reason, if treatment goals not met, if there is a change in medical status, if patient makes no progress towards goals or if patient is discharged from  hospital.  The above assessment, treatment plan, treatment alternatives and goals were discussed and mutually agreed upon: by patient  [facility] 11/13/2012, 10:24 AM

## 2012-11-13 NOTE — Evaluation (Signed)
Physical Therapy Assessment and Plan  Patient Details  Name: [name] Christensen MRN: [id number] Date of Birth: Jun 21, 1932  PT Diagnosis: Difficulty walking and Muscle weakness Rehab Potential: Excellent ELOS: 5 days   Today's Date: 11/13/2012 Time: 1030-1130 Time Calculation (min): 60 min  Problem List:  Patient Active Problem List  Diagnosis  . HYPERLIPIDEMIA  . INSOMNIA, PERSISTENT  . HYPERTENSION  . CORONARY ARTERY DISEASE  . GERD  . BENIGN PROSTATIC HYPERTROPHY  . BACK PAIN  . MUSCLE PAIN  . CORONARY ARTERY BYPASS GRAFT, HX OF  . PALPITATIONS  . CAROTID BRUIT  . Well adult exam  . Hyperkalemia  . Sudden cardiac death (resolved)  . Acute respiratory failure (resolved)   . Pneumonia  . HCAP (healthcare-associated pneumonia)  . Encephalopathy acute (resolved)  . VF (ventricular fibrillation) s/p ICD on 12/18   . Hyponatremia  . Physical deconditioning    Past Medical History:  Past Medical History  Diagnosis Date  . Coronary artery disease     s/p coronary bypass grafting 2005  . GERD (gastroesophageal reflux disease)   . Hyperlipidemia   . Hypertension   . BPH (benign prostatic hypertrophy)     elevated PSA Dr. Lindell Noe Bx 2010   Past Surgical History:  Past Surgical History  Procedure Date  . Cardiac catheterization 2007    with patent graft anatomy atretic left internal mammary  artery to the LAD which is nonobstructive. Will restart study June 08, 2007  . Coronary artery bypass graft 2005  . Lumbar fusion 09/2007    Assessment & Plan Clinical Impression: Patient is a 78 y.o. year old male with history of CAD who was admitted on 10/29/2012 witnessed arrest at home,presumed VF and CPR was started 10 minutes after cardiac arrest. Treated with arctic sun and started on IV heparin. Intubated for acute resiratory failure due to cardiac arrest and pulmonary edema. CT head negative for acute changes. 2D echo with EF 40-45% with severe hypokinesis of basal to mid  posterior and anterolateral walls. Treated with IV antibiotics for HCAP. Patient with decreased level of responsiveness and EEG of 12/13 showed severe slowing of cerebral activity c/w severe encephalopathy. He tolerated extubation 12/13 and D3 diet with nectar liquids recommended by ST. Cardiac cath on 12/17revealed widely patent graft. Post procedure, patient developed RLQ pain. U/S done without evidence of pseudoaneurysm or bleed.  ICD placed on 12/18 by Dr. Graciela Husbands. Device function normal per reports. Patient completed antibiotics for HCAP 12/17. Continue to have increased secretions with leucocytosis. ST upgraded diet to D3, thin liquids today. Therapy ongoing and the patient is deconditioned and requiring 50% venturi mask to maintain adequate oxygenation with activity. He is also noted to have impaired mentation with delayed processing. MD, PT, OT, ST recommending CIR and patient transferred to CIR on 10-Dec-2012 .   Patient currently requires min with mobility secondary to muscle weakness and decreased cardiorespiratoy endurance.  Prior to hospitalization, patient was independent with mobility and lived with Spouse in a House home.  Home access is 4 Stairs to enter.  Patient will benefit from skilled PT intervention to maximize safe functional mobility, minimize fall risk and decrease caregiver burden for planned discharge home with intermittent supervision.  Anticipate patient will benefit from cardiac rehab at discharge.  PT - End of Session Activity Tolerance: Tolerates 30+ min activity with multiple rests Endurance Deficit: Yes Endurance Deficit Description: heart rate increases to 130 bpm with small amounts of activity in standing PT Assessment Rehab Potential: Excellent  Barriers to Discharge: None PT Plan PT Intensity: Minimum of 1-2 x/day ,45 to 90 minutes PT Frequency: 5 out of 7 days PT Duration Estimated Length of Stay: 5 days PT Treatment/Interventions: Ambulation/gait  training;Balance/vestibular training;Discharge planning;Functional mobility training;Patient/family education;Stair training;Therapeutic Activities;Therapeutic Exercise;UE/LE Strength taining/ROM PT Recommendation Follow Up Recommendations: Other (comment) (cardiac rehab) Equipment Recommended: Other (comment) (possible RW for ambulation - TBD)  PT Evaluation Precautions/Restrictions Precautions Precautions: Fall;ICD/Pacemaker Precaution Comments: oxygen Restrictions Weight Bearing Restrictions: No Other Position/Activity Restrictions: Pt reports that he should not raise left arm over head   Vital Signs Oxygen Therapy SpO2: 98 % O2 Device: Nasal cannula O2 Flow Rate (L/min): 2 L/min Pulse Oximetry Type: Intermittent Pain Pain Assessment Pain Assessment: No/denies pain Pain Score: 0-No pain Home Living/Prior Functioning Home Living Lives With: Spouse Available Help at Discharge: Family Type of Home: House Home Access: Stairs to enter Secretary/administrator of Steps: 4 Entrance Stairs-Rails: None (patient reports plans to add rail) Home Layout: Two level;Able to live on main level with bedroom/bathroom Bathroom Shower/Tub: Health visitor: Handicapped height Home Adaptive Equipment: None Prior Function Level of Independence: Independent with gait;Independent with transfers Able to Take Stairs?: Yes Driving: Yes Vocation: Full time 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[employer] Vision/Perception  Vision - History Baseline Vision: Wears glasses all the time Patient Visual Report: No change from baseline Vision - Assessment Eye Alignment: Within Functional Limits Perception Perception: Within Functional Limits Praxis Praxis: Intact  Cognition Overall Cognitive Status: Appears within functional limits for tasks assessed Arousal/Alertness: Awake/alert Orientation Level: Oriented X4 Memory: Appears intact Awareness: Appears  intact Sensation Sensation Light Touch: Appears Intact Stereognosis: Not tested Hot/Cold: Not tested Proprioception: Appears Intact Coordination Gross Motor Movements are Fluid and Coordinated: Yes Fine Motor Movements are Fluid and Coordinated: Yes Motor  Motor Motor: Within Functional Limits Motor - Skilled Clinical Observations: Generalized muscle weakness and deconditioning   Mobility Transfers Sit to Stand: 5: Supervision Stand to Sit: 5: Supervision Stand Pivot Transfers: 5: Supervision Locomotion  Ambulation Ambulation/Gait Assistance: 4: Min assist Ambulation Distance (Feet): 150 Feet Assistive device: 1 person hand held assist Ambulation/Gait Assistance Details: hand held assist for balance due to generalized weakness Gait Gait Pattern: Step-through pattern;Decreased stride length;Narrow base of support Stairs / Additional Locomotion Stairs: Yes Stairs Assistance: 4: Min assist Stair Management Technique: One rail Left Number of Stairs: 5  Height of Stairs: 6   Trunk/Postural Assessment  Cervical Assessment Cervical Assessment: Within Functional Limits Thoracic Assessment Thoracic Assessment: Within Functional Limits Lumbar Assessment Lumbar Assessment: Within Functional Limits Postural Control Postural Control: Within Functional Limits  Balance Balance Balance Assessed: Yes Standardized Balance Assessment Standardized Balance Assessment: Berg Balance Test Berg Balance Test Sit to Stand: Able to stand without using hands and stabilize independently Standing Unsupported: Able to stand safely 2 minutes Sitting with Back Unsupported but Feet Supported on Floor or Stool: Able to sit safely and securely 2 minutes Stand to Sit: Sits safely with minimal use of hands Transfers: Able to transfer safely, minor use of hands Standing Unsupported with Eyes Closed: Able to stand 10 seconds safely Standing Ubsupported with Feet Together: Able to place feet together  independently and stand 1 minute safely From Standing, Reach Forward with Outstretched Arm: Can reach confidently >25 cm (10") From Standing Position, Pick up Object from Floor: Able to pick up shoe safely and easily From Standing Position, Turn to Look Behind Over each Shoulder: Looks behind from both sides and weight shifts well Turn 360  Degrees: Able to turn 360 degrees safely one side only in 4 seconds or less Standing Unsupported, Alternately Place Feet on Step/Stool: Able to complete >2 steps/needs minimal assist Standing Unsupported, One Foot in Front: Needs help to step but can hold 15 seconds Standing on One Leg: Able to lift leg independently and hold > 10 seconds Total Score: 49  Static Sitting Balance Static Sitting - Balance Support: No upper extremity supported;Feet supported Static Sitting - Level of Assistance: 7: Independent Static Standing Balance Static Standing - Balance Support: No upper extremity supported Static Standing - Level of Assistance: 7: Independent Dynamic Standing Balance Dynamic Standing - Level of Assistance: 4: Min assist Extremity Assessment  RUE Assessment RUE Assessment: Within Functional Limits LUE Assessment LUE Assessment: Within Functional Limits (pt should not raise arm overhead at this time)      See FIM for current functional status Refer to Care Plan for Long Term Goals  Recommendations for other services: None  Discharge Criteria: Patient will be discharged from PT if patient refuses treatment 3 consecutive times without medical reason, if treatment goals not met, if there is a change in medical status, if patient makes no progress towards goals or if patient is discharged from hospital.  The above assessment, treatment plan, treatment alternatives and goals were discussed and mutually agreed upon: by patient  Treatment session initiated with ambulation, stairs and balance activities. Patient also had to got to toilet. Patient  performed toileting tasks with supervision using grab bar. Patient ambulated with hand held assist and assist for oxygen. Patient ambulated 150 feet to room with rolling walker and supervision. Patient needed cueing to slow down especially on turning and approach to recliner.   [name] 11/13/2012, 12:32 PM

## 2012-11-14 ENCOUNTER — Inpatient Hospital Stay (HOSPITAL_COMMUNITY): Payer: Medicare Other | Admitting: Occupational Therapy

## 2012-11-14 ENCOUNTER — Inpatient Hospital Stay (HOSPITAL_COMMUNITY): Payer: Medicare Other | Admitting: *Deleted

## 2012-11-14 ENCOUNTER — Inpatient Hospital Stay (HOSPITAL_COMMUNITY): Payer: Medicare Other | Admitting: Speech Pathology

## 2012-11-14 NOTE — Progress Notes (Signed)
Patient ID: [name] Christensen, male   DOB: February 20, 1932, 78 y.o.   MRN: [id number]  Subjective/Complaints: 78 y.o. male with history of CAD who was admitted on 11/06/2012 witnessed arrest at home,presumedVF and CPR was started 10 minutes after cardiac arrest. Treated started on IV heparin. Intubated for acute resiratory failure due to cardiac arrest and pulmonary edema. CT head negative for acuate changes. 2D echo with EF 40-45% with severe hypokinesis of basal to mid posterior and anterolateral walls. Treated with IV antibiotics for HCAP. Patient with decreased level of responsiveness and EEG of 12/13 showed severe slowing of cerebral activity c/w severe encephalopathy. He tolerated extubation 12/13 and D3 diet with nectar liquids recommended by ST. Cardiac cath on 12/17revealed widely patent graft. Post procedure, patient developed RLQ pain. U/S done without evidence of pseudoaneurysm or bleed.  ICD placed on 12/18 by Dr. Graciela Husbands. Device function normal per reports. Patient completed antibiotics for HCAP 12/17. Continue to have increased secretions with leucocytosis. ST upgraded DIII  Review of Systems  Constitutional: Negative for chills and diaphoresis.    Objective: Vital Signs: Blood pressure 154/78, pulse 119, temperature 99 F (37.2 C), temperature source Oral, resp. rate 18, height 5\' 8"  (1.727 m), weight 139 lb 5.3 oz (63.2 kg), SpO2 93.00%. No results found.    well-developed well-nourished male in no acute distress. HEENT exam atraumatic, normocephalic, neck supple without jugular venous distention. Chest clear to auscultation cardiac exam S1-S2 are regular. Abdominal exam overweight with bowel sounds, soft and nontender.   Assessment/Plan: 1. Functional deficits secondary to Deconditioning following V fib arrest with mild hypoxic encephalopathy   Medical Problem List and Plan:  1. DVT Prophylaxis/Anticoagulation: Pharmaceutical: Lovenox  2. Pain Management: Tylenol for pain related to ICD  placement  3. Mood: Affect is appropriate will monitor  4. Neuropsych: This patient Is capable of making decisions on his/her own behalf.  5. HTN: Monitor with bid checks.  6. BPH: reporting hesitancy. . Will resume uroxtral  7. Acute on chronic iron deficiency anemia: Hgb 11.8 at admission to 8.8 currently. Will add iron supplement.  8. Hyponatremia: Likely dilutional. Recheck in am.  9. VF cardiac arrest: s/p ICD placement 12/18. Pacer precautions.  10. Right flank pain: improving 11. Leucocytosis:  Will follow    Component Value Date/Time   WBC 12.6* 11/13/2012 0625   HGB 9.1* 11/13/2012 0625   HCT 27.9* 11/13/2012 0625   PLT 504* 11/13/2012 0625   MCV 90.3 11/13/2012 0625   NEUTROABS 9.2* 11/13/2012 0625   LYMPHSABS 1.8 11/13/2012 0625   MONOABS 0.8 11/13/2012 0625   EOSABS 0.7 11/13/2012 0625   BASOSABS 0.0 11/13/2012 0625        LOS (Days) 2 A FACE TO FACE EVALUATION WAS PERFORMED  SWORDS,BRUCE HENRY 11/14/2012, 9:22 AM

## 2012-11-14 NOTE — Progress Notes (Signed)
Physical Therapy Session Note  Patient Details  Name: [name] Christensen MRN: [id number] Date of Birth: [birth year]  Today's Date: 11/14/2012 Time: 9:05-10:00 and 11:30-12:00  :Time Calculation (min): 55 min and  Short Term Goals: Week 1:  PT Short Term Goal 1 (Week 1): STG's = LTG's due to short LOS  Skilled Therapeutic Interventions/Progress Updates:  AM tx Tx focused on increased activity tolerance/therex, gait training without device, car transfer, and stairs. Entire tx completed on 1L 02 with no device and 02 sats maintained >90% throughout! Pt c/o L neck pain "kink." Provided soft tissue mobilization following heating pad sufficient distance from surgical site. Nursing made aware.  All sit<>stand transfers with S only and cues for hand placement x1 while initially using RW for first walk x150' with S.  Gait training [phone]' with close S and no device in controlled environment with no LOB for increased activity tolerance and balance. Rest breaks needed throughout due to fatigue.  Stair training with 1 L rail and S x15 with no LOB or physical assist needed. Pt SOB following stairs, but 02 sats WFL.   Car transfer with S only and safe technique noted.  Nustep x10 min, level 5 with bil LEs only for strengthening and activity tolerance. 4 seated rest breaks x45min each needed due to fatigue.  Toilet transfer with close S only.  Pt left in room with family and all needs in reach  PM tx Pt reporting somewhat improved neck pain. Advised pt to maintain upright cervical and retracted shoulder posture for optimal healing alignment. Pt needing frequent cues throughout tx.  Tx focused on mobility in home environments and high level balance tasks to reduce falls risk. Pt was able to maintian 02 sats >90% on 0.5L during activity. On room air, 02 dropped to 86%x2, but rebounded to 90% within 20 seconds with activity.  Gait training on tile and carpet with no device and close S only 3x150.' Cues  for posture only. Pt able to demonstrate quick stops and navigate busy environments and obstacles with close S and good righting reactions.  Performed bed mobility in apartment as well as furniture transfers with S only.  Performed dynamic balance with obstacle course: stepping over objects, cone weaving, and walking on compliant surfaces with min-guard>>S 4x25' with no device. Performed retro walking and retro step-over obstacles 2x10' with min-guard A and no LOB, but cues to slow down. Performed lateral step-overs as well 3x10' with min-guard. Pt able to retrieve objects from floor and carry across room with close S only. Performed beach ball toss not over shoulder height with good righting reactions and no significant LOB.  Pt left in room with all needs in reach, family present. Pt on room air, nursing made aware in order to continue monitoring.        Therapy Documentation Precautions:  Precautions Precautions: Fall;ICD/Pacemaker Precaution Comments: oxygen Restrictions Weight Bearing Restrictions: No LUE Weight Bearing:  (Minimize movement) Other Position/Activity Restrictions: Pt reports that he should not raise left arm over head    Vital Signs: Therapy Vitals Temp: 99 F (37.2 C) Temp src: Oral Pulse Rate: 134  (During exercise) Resp: 18  BP: 154/78 mmHg Patient Position, if appropriate: Lying Oxygen Therapy SpO2: 94 % O2 Device: Nasal cannula O2 Flow Rate (L/min): 1 L/min Pulse Oximetry Type: Intermittent Pain: Pain Assessment Pain Assessment: No/denies pain Pain Score:   4 Pain Type: Other (Comment) (Stifness) Pain Location: Neck Pain Orientation: Left Pain Descriptors:  (stiff) Pain Frequency:  Constant Pain Onset: Gradual Patients Stated Pain Goal: 0 Pain Intervention(s): RN made aware;Heat applied  See FIM for current functional status  Therapy/Group: Individual Therapy  Clydene Laming, PT, DPT  11/14/2012, 9:53 AM

## 2012-11-14 NOTE — Progress Notes (Signed)
Occupational Therapy Session Note  Patient Details  Name: [name] Christensen MRN: [id number] Date of Birth: [birth year]  Today's Date: 11/14/2012 Time: 0805-0900 Time Calculation (min): 55 min  Short Term Goals: No short term goals set  Skilled Therapeutic Interventions/Progress Updates:      Pt seen for BADL retraining of toileting, bathing, and dressing with a focus on activity tolerance and balance.  Pt was able to use walker with supervision to toilet and walk in shower and then completed bathing and dressing without supervision. Pt was using 2L of O2 and his sat levels were consistently at 94%.  Pt stood at sink to shave and had to remove O2 to complete shaving for 5 min, but his O2 levels decreased to 80%. After a 5 min rest with O2 at only 1L, sat levels at 94%. Pt then stood at sink with 1L to complete brushing teeth and levels remained at 94%.  Discussed with nurse, lowering O2 to 1L to start weaning pt off oxygen as patient desires to go home without it.  Heart rate continues to be anywhere from 110-125 with light activity.    Therapy Documentation Precautions:  Precautions Precautions: Fall;ICD/Pacemaker Precaution Comments: oxygen Restrictions Weight Bearing Restrictions: No LUE Weight Bearing:  (Minimize movement) Other Position/Activity Restrictions: Pt reports that he should not raise left arm over head  Vital Signs: Therapy Vitals Temp: 99 F (37.2 C) Temp src: Oral Pulse Rate: 89  Resp: 18  BP: 154/78 mmHg Patient Position, if appropriate: Lying Oxygen Therapy SpO2: 93 % O2 Device: None (Room air) O2 Flow Rate (L/min): 2 L/min Pain: Pain Assessment Pain Assessment: No/denies pain ADL:  See FIM for current functional status  Therapy/Group: Individual Therapy  Cherina Dhillon 11/14/2012, 9:03 AM

## 2012-11-14 NOTE — Progress Notes (Signed)
Speech Language Pathology Daily Session Note  Patient Details  Name: [name] Christensen MRN: [id number] Date of Birth: [birth year]  Today's Date: 11/14/2012 Time: 4098-1191 Time Calculation (min): 45 min  Short Term Goals: Week 1: SLP Short Term Goal 1 (Week 1): Short term goals equal long term goals  Skilled Therapeutic Interventions: Skilled treatment session focused on addressing cognition goals.  SLP facilitated session with supervision verbal cues to recal procedures of a new task that required patietn to alternate attention between different variables.  By end of session SLP was able to fade cues to increased wait time.  Patient also demonstrated use of external aids to assist with recall and use of word finding strategies.     FIM:  Comprehension Comprehension Mode: Auditory Comprehension: 6-Follows complex conversation/direction: With extra time/assistive device Expression Expression Mode: Verbal Expression: 6-Expresses complex ideas: With extra time/assistive device Social Interaction Social Interaction: 6-Interacts appropriately with others with medication or extra time (anti-anxiety, antidepressant). Problem Solving Problem Solving: 5-Solves complex 90% of the time/cues < 10% of the time Memory Memory: 6-Assistive device: No helper FIM - Eating Eating Activity: 7: Complete independence:no helper  Pain Pain Assessment Pain Assessment: No/denies pain  Therapy/Group: Individual Therapy  Charlane Ferretti., CCC-SLP [phone]  [name] Christensen 11/14/2012, 11:31 AM

## 2012-11-15 ENCOUNTER — Inpatient Hospital Stay (HOSPITAL_COMMUNITY): Payer: Medicare Other | Admitting: Physical Therapy

## 2012-11-15 ENCOUNTER — Encounter (HOSPITAL_COMMUNITY): Payer: Medicare Other | Admitting: Occupational Therapy

## 2012-11-15 NOTE — Progress Notes (Signed)
Physical Therapy Note  Patient Details  Name: [name] Christensen MRN: [id number] Date of Birth: 10/04/1932 Today's Date: 11/15/2012  1000-1055 (55 minutes) individual Pain: no reported pain Oxygen sats 90% RA, pulse 107 (resting) Focus of treatment: Gait training/endurance; therapeutic exercises focused on bilateral LE strengthening/activity tolerance monitoring HR/Oxygen saturation Treatment: Gait on unit SBA 150+ feet level without AD X 2; Nustep Level 5  ( 3 X 3 minutes) Oxygen sats 87%- 88% pulse 113-116 RA; alternate stepping to 8 inch step X 10 SBA; single leg stance (decreased on right); moist heat cervical area X 20 minutes   1300-1355 (55 minutes) individual Pain: "soreness" chest/ nurse notified/ meds given Focus of treatment : Therapeutic exercises focused on bilateral LE strengthening/ standing balance; gait training/endurance Treatment: gait to/from room level tile,carpet 150 feet SBA; Kinetron in standing with UE support 2 X 15 reps with Oxygen sats 88-90 % RA, pulse 108; Kinetron in standing without UE support (balance) min to SBA 2 X 10 reps with Oxygen Sats 90% pulse 111; up/down 2 X 3 steps X 2 with Oxygen sats 89-90% improved with deep breathing to 91% pulse 108.   Muriah Harsha,JIM 11/15/2012, 7:35 AM

## 2012-11-15 NOTE — Progress Notes (Signed)
Physical Therapy Note  Patient Details  Name: [name] Christensen MRN: [id number] Date of Birth: 08/16/1932 Today's Date: 11/15/2012  Time In:  7:55  Time Out:8:55. Individual session.  Patient states some neck and jaw pain - RN had provided tylenol and patient stated that this helped.  Rated pain at a 2.  ADL retraining at shower level with focus on increasing activity tolerance, dynamic standing balance, toilet and shower transfers, safety and monitoring of O2 sats and heart rate.  Patient able to maintain O2 sats at 93-95% on room air during entire session, however heart rate does climb with activity (as high as 125).  Resting heart rate however is already elevated and is approximately 101 at start of session.  Patient required cues to take short breaks when HR is climbing as patient does become symptomatic (i.e. Mild dyspnea, mild color change, increased fatigue).  Patient able to recoup HR to 109 with short breaks during session.  Patient making excellent progress.  Left patient on room air and will notify RN. Staff to monitor during the day with further activity. Evening RN did report O2 sats dropped while patient was sleeping to mid 80's.   Norton Pastel 11/15/2012, 8:56 AM

## 2012-11-15 NOTE — Progress Notes (Signed)
1845 Pt. Exhibits unsafe behavior at times; pt. Reminded of need to call for assistance.  Bed Alarm set and patient refused due to "lights" in the room.  Please round hourly and reinforce need for safety.  Call bell in reach.

## 2012-11-15 NOTE — Progress Notes (Signed)
Patient ID: [name] Christensen, male   DOB: 04/10/1932, 78 y.o.   MRN: [id number]  Subjective/Complaints: 78 y.o. male with history of CAD who was admitted on 10/26/2012 witnessed arrest at home,presumedVF and CPR was started 10 minutes after cardiac arrest. Treated started on IV heparin. Intubated for acute resiratory failure due to cardiac arrest and pulmonary edema. CT head negative for acuate changes. 2D echo with EF 40-45% with severe hypokinesis of basal to mid posterior and anterolateral walls. Treated with IV antibiotics for HCAP. Patient with decreased level of responsiveness and EEG of 12/13 showed severe slowing of cerebral activity c/w severe encephalopathy. He tolerated extubation 12/13 and D3 diet with nectar liquids recommended by ST. Cardiac cath on 12/17revealed widely patent graft. Post procedure, patient developed RLQ pain. U/S done without evidence of pseudoaneurysm or bleed.  ICD placed on 12/18 by Dr. Graciela Husbands. Device function normal per reports. Patient completed antibiotics for HCAP 12/17. Continue to have increased secretions with leucocytosis.   No complaints except for left sided jaw pain  Review of Systems  Constitutional: Negative for chills and diaphoresis.    Objective: Vital Signs: Blood pressure 159/74, pulse 95, temperature 98.2 F (36.8 C), temperature source Oral, resp. rate 17, height 5\' 8"  (1.727 m), weight 138 lb 10.7 oz (62.9 kg), SpO2 97.00%. No results found.    well-developed well-nourished male in no acute distress. HEENT exam atraumatic, normocephalic, neck supple without jugular venous distention. Chest clear to auscultation cardiac exam S1-S2 are regular. Abdominal exam overweight with bowel sounds, soft and nontender.   Assessment/Plan: 1. Functional deficits secondary to Deconditioning following V fib arrest with mild hypoxic encephalopathy   Medical Problem List and Plan:  1. DVT Prophylaxis/Anticoagulation: Pharmaceutical: Lovenox  2. Pain  Management: Tylenol for pain related to ICD placement  3. Mood: Affect is appropriate will monitor  4. Neuropsych: This patient Is capable of making decisions on his/her own behalf.  5. HTN: Monitor with bid checks.  6. BPH: reporting hesitancy. . Will resume uroxtral  7. Acute on chronic iron deficiency anemia: Hgb 11.8 on admission. Will add iron supplement.  Lab Results  Component Value Date   HGB 9.1* 11/13/2012   8. Hyponatremia: reolved Basic Metabolic Panel:    Component Value Date/Time   NA 135 11/13/2012 0625   K 3.6 11/13/2012 0625   CL 100 11/13/2012 0625   CO2 29 11/13/2012 0625   BUN 13 11/13/2012 0625   CREATININE 0.79 11/13/2012 0625   GLUCOSE 115* 11/13/2012 0625   CALCIUM 9.5 11/13/2012 0625    9. VF cardiac arrest: s/p ICD placement 12/18. Pacer precautions.  10. Right flank pain: improving 11. Leucocytosis:  Will follow    Component Value Date/Time   WBC 12.6* 11/13/2012 0625   HGB 9.1* 11/13/2012 0625   HCT 27.9* 11/13/2012 0625   PLT 504* 11/13/2012 0625   MCV 90.3 11/13/2012 0625   NEUTROABS 9.2* 11/13/2012 0625   LYMPHSABS 1.8 11/13/2012 0625   MONOABS 0.8 11/13/2012 0625   EOSABS 0.7 11/13/2012 0625   BASOSABS 0.0 11/13/2012 0625   LOS (Days) 3 A FACE TO FACE EVALUATION WAS PERFORMED  Rohini Jaroszewski HENRY 11/15/2012, 9:15 AM

## 2012-11-16 ENCOUNTER — Inpatient Hospital Stay (HOSPITAL_COMMUNITY): Payer: Medicare Other | Admitting: Occupational Therapy

## 2012-11-16 ENCOUNTER — Inpatient Hospital Stay (HOSPITAL_COMMUNITY): Payer: Medicare Other | Admitting: *Deleted

## 2012-11-16 ENCOUNTER — Inpatient Hospital Stay (HOSPITAL_COMMUNITY): Payer: Medicare Other | Admitting: Speech Pathology

## 2012-11-16 ENCOUNTER — Inpatient Hospital Stay (HOSPITAL_COMMUNITY): Payer: Medicare Other

## 2012-11-16 DIAGNOSIS — R5381 Other malaise: Secondary | ICD-10-CM

## 2012-11-16 DIAGNOSIS — G931 Anoxic brain damage, not elsewhere classified: Secondary | ICD-10-CM

## 2012-11-16 NOTE — Progress Notes (Signed)
Physical Therapy Session Note  Patient Details  Name: [name] Christensen MRN: [id number] Date of Birth: [birth year]  Today's Date: 11/16/2012 Time: 4098-1191 Time Calculation (min): 29 min  Short Term Goals: Week 1:  PT Short Term Goal 1 (Week 1): STG's = LTG's due to short LOS  Skilled Therapeutic Interventions/Progress Updates:    Today's session focused on high level balance and dynamic balance activities during gait.  Therapy Documentation Precautions:  Precautions Precautions: None Precaution Comments: oxygen Restrictions Weight Bearing Restrictions: No LUE Weight Bearing:  (Minimize movement) Other Position/Activity Restrictions: Patient reports he should not raise L arm over head Pain: Pain Assessment Pain Assessment: No/denies pain Pain Score: 0-No pain Locomotion : Ambulation Ambulation: Yes Ambulation/Gait Assistance: 6: Modified independent (Device/Increase time) Ambulation Distance (Feet): 155 Feet x2 mod I (increase time) Assistive device: None Ambulation/Gait Assistance Details: Patient performed gait training x155' mod I Gait Gait: Yes Gait Pattern: Step-through pattern;Narrow base of support;Decreased stride length High Level Ambulation High Level Ambulation: Backwards walking;Toe walking;Heel walking;Sudden stops;Head turns Backwards Walking: Min Assist x25' Sudden Stops: 20' x2 with supervision Head Turns: 25' x2 each for horizontal and vertical head turns. Noted decreased in gait speed with head turns and patient requires supervision. Toe Walking: Min Assist x25' Heel Walking: Min Assist x25'  [place]: 25' x1 in each direction with min assist, 2 instances of mod assist to remain upright after lateral LOB   See FIM for current functional status  Therapy/Group: Individual Therapy  Chipper Herb. Mariyam Remington, PT, DPT  11/16/2012, 3:40 PM

## 2012-11-16 NOTE — Progress Notes (Signed)
Social Work Patient ID: [name] Christensen, male   DOB: [birth year], 78 y.o.   MRN: [id number] Team feels pt will be ready for discharge tomorrow, MD aware and agrees.  Recommend Cardiac rehab for follow up. Wife here today and observing in therapies.  Both agreeable to plans.

## 2012-11-16 NOTE — Progress Notes (Signed)
Speech Language Pathology Daily Session Note & Discharge Summary  Patient Details  Name: [name] Christensen MRN: [id number] Date of Birth: 24-Oct-1932  Today's Date: 11/16/2012 Time: 4098-1191 Time Calculation (min): 29 min  Short Term Goals: Week 1: SLP Short Term Goal 1 (Week 1): Short term goals equal long term goals  Skilled Therapeutic Interventions: Skilled treatment session focused on addressing cognition goals during self-care tasks to prepare for discharge. Patient utilized external aid to recall medications and asked appropriate questions regarding questions.  Patient and wife verbalized understanding of previously taught strategies and has demonstrated use.  Patient overall Independent-Mod I with normal vocal quality and WFL swallow as a result all goals are met and patient is ready for discharge.      FIM:  Comprehension Comprehension Mode: Auditory Comprehension: 7-Follows complex conversation/direction: With no assist Expression Expression Mode: Verbal Expression: 7-Expresses complex ideas: With no assist Social Interaction Social Interaction: 7-Interacts appropriately with others - No medications needed. Problem Solving Problem Solving: 7-Solves complex problems: Recognizes & self-corrects Memory Memory: 6-Assistive device: No helper  Pain Pain Assessment Pain Assessment: No/denies pain Pain Score: 0-No pain  Therapy/Group: Individual Therapy   Speech Language Pathology Discharge Summary  Patient Details  Name: [name] Christensen MRN: [id number] Date of Birth: 07-19-1932  Today's Date: 11/16/2012  Patient has met 3 of 3 long term goals.  Patient to discharge at overall Independent;Modified Independent level.  Reasons goals not met: n/a   Clinical Impression/Discharge Summary: Patient made functional improvement in swallow function/toleration of upgrade on admission, recall of new, complex information with Independence-Mod I with occasional cues from wife and use  of external aids as needed.  Patient is consuming regular textures and thin liquids with no safe swallow compensatory strategies and is ready for discharge with the recommendation of no follow up.  Patient and wife are in agreement.    Care Partner:   Education complete   Recommendation:  None     Equipment: none   Reasons for discharge: Treatment goals met;Discharged from hospital   Patient/Family Agrees with Progress Made and Goals Achieved: Yes   See FIM for current functional status  Charlane Ferretti., CCC-SLP [phone]  Alfio Loescher 11/16/2012, 3:50 PM

## 2012-11-16 NOTE — Progress Notes (Signed)
Social Work Patient ID: [name] Christensen, male   DOB: [birth year], 78 y.o.   MRN: [id number] Met with Caroline-PT who recommends OPPT for higher level balance issues then transition to Cardiac rehab. Pt is agreeable to this and have set him up with [facility].  No other needs. Wife here to completed family education.

## 2012-11-16 NOTE — Progress Notes (Signed)
Explained to pt the importance of bed alarm, but pt still strongly refused to put the bed alarm on.  Pt stated that He will call before getting out of bed and pt is doing good in pressing the call bell if needs to go to toilet so far.   Will increase rounding on the pt and will continue to monitor.  Call bell within reach.

## 2012-11-16 NOTE — Progress Notes (Signed)
Patient ID: [name] Christensen, male   DOB: 05-07-1932, 78 y.o.   MRN: [id number]  Subjective/Complaints: 78 y.o. male with history of CAD who was admitted on 10/29/2012 witnessed arrest at home,presumedVF and CPR was started 10 minutes after cardiac arrest. Treated started on IV heparin. Intubated for acute resiratory failure due to cardiac arrest and pulmonary edema. CT head negative for acuate changes. 2D echo with EF 40-45% with severe hypokinesis of basal to mid posterior and anterolateral walls. Treated with IV antibiotics for HCAP. Patient with decreased level of responsiveness and EEG of 12/13 showed severe slowing of cerebral activity c/w severe encephalopathy. He tolerated extubation 12/13 and D3 diet with nectar liquids recommended by ST. Cardiac cath on 12/17revealed widely patent graft. Post procedure, patient developed RLQ pain. U/S done without evidence of pseudoaneurysm or bleed.  ICD placed on 12/18 by Dr. Graciela Husbands. Device function normal per reports. Patient completed antibiotics for HCAP 12/17.  Review of Systems  Respiratory: Positive for shortness of breath.        With activity  All other systems reviewed and are negative.    Objective: Vital Signs: Blood pressure 149/81, pulse 96, temperature 98.8 F (37.1 C), temperature source Oral, resp. rate 18, height 5\' 8"  (1.727 m), weight 62.9 kg (138 lb 10.7 oz), SpO2 93.00%. No results found. No results found for this or any previous visit (from the past 72 hour(s)).   HEENT: normal Cardio: tachy Resp: CTA B/L GI: BS positive Extremity:  No Edema Skin:   Intact Neuro: Alert/Oriented, Cranial Nerve II-XII normal, Normal Sensory and Abnormal Motor 4/5 in BUE and BLE Musc/Skel:  Normal GEN: NAD   Assessment/Plan: 1. Functional deficits secondary to Deconditioning following V fib arrest with mild hypoxic encephalopathy which require 3+ hours per day of interdisciplinary therapy in a comprehensive inpatient rehab setting. Physiatrist  is providing close team supervision and 24 hour management of active medical problems listed below. Physiatrist and rehab team continue to assess barriers to discharge/monitor patient progress toward functional and medical goals. FIM: FIM - Bathing Bathing Steps Patient Completed: Chest;Right Arm;Left Arm;Abdomen;Front perineal area;Buttocks;Right upper leg;Left lower leg (including foot);Right lower leg (including foot);Left upper leg Bathing: 6: More than reasonable amount of time  FIM - Upper Body Dressing/Undressing Upper body dressing/undressing steps patient completed: Thread/unthread right sleeve of pullover shirt/dresss;Thread/unthread left sleeve of pullover shirt/dress;Put head through opening of pull over shirt/dress;Pull shirt over trunk Upper body dressing/undressing: 7: Complete Independence: No helper FIM - Lower Body Dressing/Undressing Lower body dressing/undressing steps patient completed: Thread/unthread right underwear leg;Thread/unthread left underwear leg;Pull underwear up/down;Thread/unthread right pants leg;Thread/unthread left pants leg;Don/Doff left sock;Don/Doff right sock;Pull pants up/down;Don/Doff right shoe;Fasten/unfasten pants;Don/Doff left shoe;Fasten/unfasten right shoe;Fasten/unfasten left shoe Lower body dressing/undressing: 7: Complete Independence: No helper  FIM - Toileting Toileting steps completed by patient: Adjust clothing prior to toileting;Performs perineal hygiene;Adjust clothing after toileting Toileting Assistive Devices: Grab bar or rail for support Toileting: 7: Independent: No helper, no device  FIM - Diplomatic Services operational officer Devices: Grab bars Toilet Transfers: 6-More than reasonable amt of time  FIM - Banker Devices: Arm rests Bed/Chair Transfer: 5: Supine > Sit: Supervision (verbal cues/safety issues);5: Sit > Supine: Supervision (verbal cues/safety issues)  FIM - Locomotion:  Wheelchair Locomotion: Wheelchair: 0: Activity did not occur FIM - Locomotion: Ambulation Locomotion: Ambulation Assistive Devices: Other (comment) (none) Ambulation/Gait Assistance: 5: Supervision Locomotion: Ambulation: 5: Travels 150 ft or more with supervision/safety issues  Comprehension Comprehension Mode: Auditory Comprehension: 7-Follows complex  conversation/direction: With no assist  Expression Expression Mode: Verbal Expression: 7-Expresses complex ideas: With no assist  Social Interaction Social Interaction: 6-Interacts appropriately with others with medication or extra time (anti-anxiety, antidepressant).  Problem Solving Problem Solving: 7-Solves complex problems: Recognizes & self-corrects  Memory Memory: 7-Complete Independence: No helper   Medical Problem List and Plan:  1. DVT Prophylaxis/Anticoagulation: Pharmaceutical: Lovenox  2. Pain Management: Tylenol for pain related to ICD placement  3. Mood: Affect is appropriate will monitor  4. Neuropsych: This patient Is capable of making decisions on his/her own behalf.  5. HTN: Monitor with bid checks.  6. BPH: reporting hesitancy. . Will resume uroxtral  7. Acute on chronic iron deficiency anemia: Hgb 11.8 at admission to 8.8 currently. Will add iron supplement.  8. Hyponatremia: Likely dilutional. Recheck in am.  9. VF cardiac arrest: s/p ICD placement 12/18. Pacer precautions.  10. Right flank pain: Now limited to activity.Patient points to the inguinal area which is where he was catheterized. Small amount of bruising Will monitor.  11. Leucocytosis: UCS 12/17 without growth.    LOS (Days) 4 A FACE TO FACE EVALUATION WAS PERFORMED  [name] E 11/16/2012, 9:38 AM

## 2012-11-16 NOTE — Progress Notes (Signed)
Physical Therapy Session Note  Patient Details  Name: [name] Christensen MRN: [id number] Date of Birth: [birth year]  Today's Date: 11/16/2012 Time: 1105-1205 Time Calculation (min): 60 min  Short Term Goals: Week 1:  PT Short Term Goal 1 (Week 1): STG's = LTG's due to short LOS  Skilled Therapeutic Interventions/Progress Updates:  Family ed with wife, return demonstration of basic transfers, gait on level ground and up/down curb without AD; up/down 5 steps with 1 rail, car transfer  Repeated Berg: 50/56; no significant change since admission  Standing therapeutic exercise performed with LE to increase strength for functional mobility: 10 x 1 each: bil calf raises, mini squats in R and L modified tandem pattern.  Standing balance training: on compliant Airex mat- mini squats as above, and toes up/down with wt shifting to heels, with frequent LOB regained independently 80% of time  Floor transfer using floor mat and car transfer,  modified independent, after 1 demo.   Pt made independent in room; pt and family informed.  Pt reported that he has to urinate multiple times each night due to meds; RN informed.     Therapy Documentation Precautions:  Precautions Precautions: Fall (defribrillator) Precaution Comments: oxygen Restrictions Weight Bearing Restrictions: No LUE Weight Bearing:  (Minimize movement) Other Position/Activity Restrictions: orts that he should not raise left arm over head Pain: Pain Assessment Pain Assessment: No/denies pain   Locomotion : Ambulation Ambulation/Gait Assistance: 6: Modified independent (Device/Increase time)     Balance: Balance Balance Assessed: Yes Standardized Balance Assessment Standardized Balance Assessment: Berg Balance Test Berg Balance Test Sit to Stand: Able to stand without using hands and stabilize independently Standing Unsupported: Able to stand safely 2 minutes Sitting with Back Unsupported but Feet Supported on Floor or  Stool: Able to sit safely and securely 2 minutes Stand to Sit: Sits safely with minimal use of hands Transfers: Able to transfer safely, minor use of hands Standing Unsupported with Eyes Closed: Able to stand 10 seconds safely Standing Ubsupported with Feet Together: Able to place feet together independently and stand 1 minute safely From Standing, Reach Forward with Outstretched Arm: Can reach confidently >25 cm (10") From Standing Position, Pick up Object from Floor: Able to pick up shoe safely and easily From Standing Position, Turn to Look Behind Over each Shoulder: Looks behind from both sides and weight shifts well Turn 360 Degrees: Able to turn 360 degrees safely in 4 seconds or less Standing Unsupported, Alternately Place Feet on Step/Stool: Able to stand independently and safely and complete 8 steps in 20 seconds Standing Unsupported, One Foot in Front: Needs help to step but can hold 15 seconds Standing on One Leg: Tries to lift leg/unable to hold 3 seconds but remains standing independently Total Score: 50       See FIM for current functional status  Therapy/Group: Individual Therapy  Janaisa Birkland 11/16/2012, 12:34 PM

## 2012-11-16 NOTE — Progress Notes (Signed)
Occupational Therapy Session Note  Patient Details  Name: [name] Christensen MRN: [id number] Date of Birth: 12-24-1931  Today's Date: 11/16/2012 Time: 1300-1330 Time Calculation (min): 30 min  Short Term Goals: Week 1:    STG = LTGs due to short LOS  Skilled Therapeutic Interventions/Progress Updates:    Pt seen for 1:1 OT with focus on dynamic standing balance with quick side stepping and tandem stance with reaching outside BOS. Engaged in side stepping with quick steps to challenge balance reactions with one UE support without LOB.  Tandem stance without UE support while reaching outside BOS with pt requiring close supervision, however no LOB.  Intermittent HR monitoring with elevated HR 105-112 throughout activity, O2 remained >94% on room air. Educated pt on rest breaks when feeling SOB or HR increased elevation, and taught about feeling for carotid pulse.  Therapy Documentation Precautions:  Precautions Precautions: Fall (defribrillator) Precaution Comments: oxygen Restrictions Weight Bearing Restrictions: No LUE Weight Bearing:  (Minimize movement) Other Position/Activity Restrictions: orts that he should not raise left arm over head Pain: Pain Assessment Pain Assessment: No/denies pain  See FIM for current functional status  Therapy/Group: Individual Therapy  Leonette Monarch 11/16/2012, 2:43 PM

## 2012-11-16 NOTE — Progress Notes (Signed)
Occupational Therapy Session Note  Patient Details  Name: [name] Christensen MRN: [id number] Date of Birth: 1932/07/22  Today's Date: 11/16/2012 Time: 0800-0900 Time Calculation (min): 60 min  Short Term Goals: No short term goals set  Skilled Therapeutic Interventions/Progress Updates:      Pt seen for BADL retraining of toileting, bathing, and dressing with a focus on activity tolerance, energy conservation, and deep breathing.  Pt encouraged to sit and shave versus standing as his resting O2  sats were 90-91%.  Once patient was up and moving and walking around room and in hallway, sats increased to 94%.  Pt was able to ambulated to toilet without supervision, but supervision provided in and out of shower for safety.  Pt gathered clothing and was independent with dressing and toileting and mod I with bathing as he used shower bench.  Pt's wife present for entire session and for family education.  She is very pleased with his progress. Discussed going home tomorrow.  Therapy Documentation Precautions:  Precautions Precautions: Fall;ICD/Pacemaker Precaution Comments: oxygen Restrictions Weight Bearing Restrictions: No LUE Weight Bearing:  (Minimize movement) Other Position/Activity Restrictions: Pt reports that he should not raise left arm over head    Pain: pt reports minimal pain in left jaw, it has decreased in pain.  Nursing aware.   ADL:   See FIM for current functional status  Therapy/Group: Individual Therapy  Densil Ottey 11/16/2012, 9:20 AM

## 2012-11-16 NOTE — Progress Notes (Signed)
Physical Therapy Discharge Summary  Patient Details  Name: [name] Christensen MRN: [id number] Date of Birth: 05-27-1932  Today's Date: 11/17/2012 Time: 9:30 - 10:00 Time Calculation (min): 30 min  Patient has met 7 of 7 long term goals due to improved activity tolerance and improved balance.  Patient to discharge at an ambulatory level Independent.   Patient's care partner is independent to provide the necessary physical assistance on their stairs at home, which do not have a railing installed yet, at discharge.  Reasons goals not met: N/A patient met all goals  Recommendation:  Patient will benefit from ongoing skilled PT services in outpatient setting to continue to advance safe functional mobility, address ongoing impairments in high level balance, muscular endurance, activity tolerance, and minimize fall risk. Patient will then follow up with cardiac rehab to work up to pre functional level.  Equipment: No equipment provided  Reasons for discharge: treatment goals met and discharge from hospital  Patient/family agrees with progress made and goals achieved: Yes  PT Discharge Precautions/Restrictions Restrictions Weight Bearing Restrictions: No Vital Signs Therapy Vitals Temp: 98.8 F (37.1 C) Temp src: Oral Pulse Rate: 110  Resp: 17  BP: 150/73 mmHg Oxygen Therapy SpO2: 91 % O2 Device: None (Room air) Pain Pain Assessment Pain Assessment: No/denies pain Pain Score: 0-No pain Vision - History Baseline Vision: Wears glasses all the time Patient Visual Report: No change from baseline Vision - Assessment Eye Alignment: Within Functional Limits Perception Perception: Within Functional Limits Praxis Praxis: Intact  Cognition Overall Cognitive Status: Appears within functional limits for tasks assessed Orientation Level: Oriented X4 Sensation Sensation Light Touch: Appears Intact Stereognosis: Appears Intact Hot/Cold: Appears Intact Proprioception: Appears  Intact Coordination Gross Motor Movements are Fluid and Coordinated: Yes Fine Motor Movements are Fluid and Coordinated: Yes Motor  Motor Motor: Within Functional Limits  Mobility Transfers Sit to Stand: 7: Independent Stand Pivot Transfers: 7: Independent Locomotion  Ambulation Ambulation: Yes Ambulation/Gait Assistance: 6: Modified independent (Device/Increase time) Ambulation Distance (Feet): 155 Feet Assistive device: None Ambulation/Gait Assistance Details: Patient performed gait training x155' mod I Gait Gait: Yes Gait Pattern: Step-through pattern;Narrow base of support;Decreased stride length High Level Ambulation High Level Ambulation: Backwards walking;Toe walking;Heel walking;Sudden stops;Head turns Backwards Walking: Min Assist x25' Sudden Stops: 32' x2 with min assist Head Turns: 25' x2 each for horizontal and vertical head turns. Noted decreased in gait speed with head turns and patient requires min assist. Toe Walking: Min Assist x25' Heel Walking: Min Assist x25'  Trunk/Postural Assessment  Cervical Assessment Cervical Assessment: Within Functional Limits Thoracic Assessment Thoracic Assessment: Within Functional Limits Lumbar Assessment Lumbar Assessment: Within Functional Limits Postural Control Postural Control: Within Functional Limits  Balance Balance Balance Assessed: Yes Standardized Balance Assessment Standardized Balance Assessment: Berg Balance Test on 11/16/12 Berg Balance Test Sit to Stand: Able to stand without using hands and stabilize independently Standing Unsupported: Able to stand safely 2 minutes Sitting with Back Unsupported but Feet Supported on Floor or Stool: Able to sit safely and securely 2 minutes Stand to Sit: Sits safely with minimal use of hands Transfers: Able to transfer safely, minor use of hands Standing Unsupported with Eyes Closed: Able to stand 10 seconds safely Standing Ubsupported with Feet Together: Able to place  feet together independently and stand 1 minute safely From Standing, Reach Forward with Outstretched Arm: Can reach confidently >25 cm (10") From Standing Position, Pick up Object from Floor: Able to pick up shoe safely and easily From Standing Position, Turn to Look  Behind Over each Shoulder: Looks behind from both sides and weight shifts well Turn 360 Degrees: Able to turn 360 degrees safely in 4 seconds or less Standing Unsupported, Alternately Place Feet on Step/Stool: Able to stand independently and safely and complete 8 steps in 20 seconds Standing Unsupported, One Foot in Front: Needs help to step but can hold 15 seconds Standing on One Leg: Tries to lift leg/unable to hold 3 seconds but remains standing independently Total Score: 50   DGI score - 22  Extremity Assessment  RUE Assessment RUE Assessment: Within Functional Limits LUE Assessment LUE Assessment: Within Functional Limits      See FIM for current functional status  [name] Christensen M 11/17/2012, 10:16 AM

## 2012-11-16 NOTE — Progress Notes (Signed)
Social Work Discharge Note Discharge Note  The overall goal for the admission was met for:   Discharge location: Yes-HOME WITH WIFE WHO CAN BE THERE WITH HIM  Length of Stay: Yes-5 DAYS  Discharge activity level: Yes-INDEPENDENT LEVEL  Home/community participation: Yes  Services provided included: MD, RD, PT, OT, SLP, RN, Pharmacy and SW  Financial Services: Medicare and Private Insurance: [employer]  Follow-up services arranged: Outpatient: [facility]- PT  12/26 8;30-9:45 AM  Comments (or additional information):FAMILY EDUCATION COMPLETED   Patient/Family verbalized understanding of follow-up arrangements: Yes  Individual responsible for coordination of the follow-up plan: SELF & BETTY-WIFE  Confirmed correct DME delivered: Lucy Chris 11/16/2012    Lucy Chris

## 2012-11-17 ENCOUNTER — Inpatient Hospital Stay (HOSPITAL_COMMUNITY): Payer: Medicare Other

## 2012-11-17 ENCOUNTER — Encounter (HOSPITAL_COMMUNITY): Payer: Medicare Other | Admitting: Occupational Therapy

## 2012-11-17 MED ORDER — METOPROLOL TARTRATE 100 MG PO TABS
100.0000 mg | ORAL_TABLET | Freq: Two times a day (BID) | ORAL | Status: DC
  Filled 2012-11-17 (×2): qty 1

## 2012-11-17 MED ORDER — ALFUZOSIN HCL ER 10 MG PO TB24: 10.0000 mg | ORAL_TABLET | Freq: Every day | ORAL | Status: DC

## 2012-11-17 MED ORDER — METOPROLOL TARTRATE 100 MG PO TABS: 100.0000 mg | ORAL_TABLET | Freq: Two times a day (BID) | ORAL | Status: DC

## 2012-11-17 MED ORDER — ATORVASTATIN CALCIUM 40 MG PO TABS: 40.0000 mg | ORAL_TABLET | ORAL | Status: DC

## 2012-11-17 MED ORDER — POLYSACCHARIDE IRON COMPLEX 150 MG PO CAPS: 150.0000 mg | ORAL_CAPSULE | Freq: Two times a day (BID) | ORAL | Status: DC

## 2012-11-17 NOTE — Progress Notes (Signed)
1035 Pt. Discharged from rehab to home with wife.  All discharge instructions provided by Marissa Nestle, PA.  No further questions asked.  Escorted to vehicle by NT; all personal belongings in tow.

## 2012-11-17 NOTE — Progress Notes (Signed)
Physical Therapy Note  Patient Details  Name: ARBOR COHEN MRN: [id number] Date of Birth: 18-Dec-1931 Today's Date: 11/17/2012  9:30 - 10:00  30 minutes Individual session Patient reports no pain  Therapy session focused on high level balance, reassessment of the Dynamic Gait Index, review of steps with wife, completion of patient/family education. Patient and wife safely demonstrated ambulating up and down 5 steps with hand held assist. Patient during DGI ambulated up and down steps without railing, no assistance and no loss of balance. Patient's HR up to 125 following ambulation - would come down to 110 after resting. Pulse O2 remained above 90 with activity. Discussed energy conservation with patient and wife especially related to visitors over the holidays and patient's need to slow down. Patient and wife verbalized understanding and reported they have no further questions.  [name] 11/17/2012, 10:20 AM

## 2012-11-17 NOTE — Progress Notes (Signed)
Discharge summary [phone]

## 2012-11-17 NOTE — Progress Notes (Addendum)
Patient ID: [name] Christensen, male   DOB: [birth year], 78 y.o.   MRN: [id number]  Subjective/Complaints: 78 y.o. male with history of CAD who was admitted on 11/13/2012 witnessed arrest at home,presumedVF and CPR was started 10 minutes after cardiac arrest. Treated started on IV heparin. Intubated for acute resiratory failure due to cardiac arrest and pulmonary edema. CT head negative for acuate changes. 2D echo with EF 40-45% with severe hypokinesis of basal to mid posterior and anterolateral walls. Treated with IV antibiotics for HCAP. Patient with decreased level of responsiveness and EEG of 12/13 showed severe slowing of cerebral activity c/w severe encephalopathy. He tolerated extubation 12/13 and D3 diet with nectar liquids recommended by ST. Cardiac cath on 12/17revealed widely patent graft. Post procedure, patient developed RLQ pain. U/S done without evidence of pseudoaneurysm or bleed.  ICD placed on 12/18 by Dr. Graciela Husbands. Device function normal per reports. Patient completed antibiotics for HCAP 12/17.  Review of Systems  Respiratory: Positive for shortness of breath.        With activity  All other systems reviewed and are negative.    Objective: Vital Signs: Blood pressure 150/73, pulse 110, temperature 98.8 F (37.1 C), temperature source Oral, resp. rate 17, height 5\' 8"  (1.727 m), weight 63.6 kg (140 lb 3.4 oz), SpO2 91.00%. No results found. No results found for this or any previous visit (from the past 72 hour(s)).   HEENT: normal Cardio: tachy Resp: CTA B/L GI: BS positive Extremity:  No Edema Skin:   Intact Neuro: Alert/Oriented, Cranial Nerve II-XII normal, Normal Sensory and Abnormal Motor 4/5 in BUE and BLE Musc/Skel:  Normal GEN: NAD   Assessment/Plan: 1. Functional deficits secondary to Deconditioning following V fib arrest  Stable for D/C today F/U PCP F/U Dr [name]    No PMR F/U    FIM: FIM - Bathing Bathing Steps Patient Completed: Chest;Right Arm;Left  Arm;Abdomen;Front perineal area;Buttocks;Right upper leg;Left lower leg (including foot);Right lower leg (including foot);Left upper leg Bathing: 6: More than reasonable amount of time  FIM - Upper Body Dressing/Undressing Upper body dressing/undressing steps patient completed: Thread/unthread right sleeve of pullover shirt/dresss;Thread/unthread left sleeve of pullover shirt/dress;Put head through opening of pull over shirt/dress;Pull shirt over trunk Upper body dressing/undressing: 7: Complete Independence: No helper FIM - Lower Body Dressing/Undressing Lower body dressing/undressing steps patient completed: Thread/unthread right underwear leg;Thread/unthread left underwear leg;Pull underwear up/down;Thread/unthread right pants leg;Thread/unthread left pants leg;Don/Doff left sock;Don/Doff right sock;Pull pants up/down;Don/Doff right shoe;Fasten/unfasten pants;Don/Doff left shoe;Fasten/unfasten right shoe;Fasten/unfasten left shoe Lower body dressing/undressing: 7: Complete Independence: No helper  FIM - Toileting Toileting steps completed by patient: Adjust clothing prior to toileting;Performs perineal hygiene;Adjust clothing after toileting Toileting Assistive Devices: Grab bar or rail for support Toileting: 7: Independent: No helper, no device  FIM - Diplomatic Services operational officer Devices: Therapist, music Transfers: 7-Independent: No helper  FIM - Banker Devices: Arm rests Bed/Chair Transfer: 7: Independent: No helper  FIM - Locomotion: Wheelchair Locomotion: Wheelchair: 0: Activity did not occur FIM - Locomotion: Ambulation Locomotion: Ambulation Assistive Devices: Other (comment) (none) Ambulation/Gait Assistance: 6: Modified independent (Device/Increase time) Locomotion: Ambulation: 7: Travels 150 ft or more independently  Comprehension Comprehension Mode: Auditory Comprehension: 7-Follows complex conversation/direction:  With no assist  Expression Expression Mode: Verbal Expression: 7-Expresses complex ideas: With no assist  Social Interaction Social Interaction: 7-Interacts appropriately with others - No medications needed.  Problem Solving Problem Solving: 7-Solves complex problems: Recognizes & self-corrects  Memory Memory: 6-Assistive device:  No helper   Medical Problem List and Plan:  1. DVT Prophylaxis/Anticoagulation: Pharmaceutical: Lovenox  2. Pain Management: Tylenol for pain related to ICD placement  3. Mood: Affect is appropriate will monitor  4. Neuropsych: This patient Is capable of making decisions on his/her own behalf.  5. HTN: Monitor with bid checks. Mild elevation of systolic will increase lopressor 6. BPH: reporting hesitancy. . Will resume uroxtral Qhs 7. Acute on chronic iron deficiency anemia: Hgb 11.8 at admission to 8.8 currently. Will add iron supplement.  8. Hyponatremia: Likely dilutional. Recheck in am.  9. VF cardiac arrest: s/p ICD placement 12/18. Pacer precautions.Has been tachycardic will increase lopressor to 100mg  BID  10. Right flank pain: Now limited to activity.Patient points to the inguinal area which is where he was catheterized. Small amount of bruising Will monitor.  11. Leucocytosis: UCS 12/17 without growth.    LOS (Days) 5 A FACE TO FACE EVALUATION WAS PERFORMED  Alexander Aument E 11/17/2012, 9:03 AM

## 2012-11-17 NOTE — Progress Notes (Signed)
Occupational Therapy Treatment Session and Discharge Summary  Patient Details  Name: [name] Christensen MRN: [id number] Date of Birth: 08-29-1932  Today's Date: 11/17/2012 Time: 0800-0845 Time Calculation (min): 45 min  1:1   Pt seen for BADL retraining of toileting, bathing, and dressing with a focus on patient completing all activities at an independent level, except for supervision with shower transfer and mod I to bathe sitting on a seat.  Pt performed all activities well demonstrating good activity tolerance.  Pt ambulated to ADL kitchen to work on retrieving items for simple meal prep for preparing oatmeal. Pt able to reach into all cupboards and into low drawers of refrigerator.     Patient has met 8 of 8 long term goals due to improved activity tolerance and improved balance.  Patient to discharge at overall Independent level, except for supervision with simple meal prep and shower transfers.  Patient's care partner is independent to provide the necessary physical assistance at discharge.    Reasons goals not met: n/a  Recommendation:  n/a Equipment: No equipment provided  Reasons for discharge: treatment goals met  Patient/family agrees with progress made and goals achieved: Yes  OT Discharge Pain Pain Assessment Pain Assessment: No/denies pain Pain Score: 0-No pain ADL ADL Eating: Independent Grooming: Independent Upper Body Bathing: Independent Lower Body Bathing: Independent Upper Body Dressing: Independent Lower Body Dressing: Independent Toileting: Independent Toilet Transfer: Independent Film/video editor: Distant supervision Film/video editor Method: Designer, industrial/product: Information systems manager with back Vision/Perception  Vision - History Baseline Vision: Wears glasses all the time Patient Visual Report: No change from baseline Vision - Assessment Eye Alignment: Within Functional Limits Perception Perception: Within Functional  Limits Praxis Praxis: Intact  Cognition Overall Cognitive Status: Appears within functional limits for tasks assessed Orientation Level: Oriented X4 Sensation Sensation Light Touch: Appears Intact Stereognosis: Appears Intact Hot/Cold: Appears Intact Proprioception: Appears Intact Coordination Gross Motor Movements are Fluid and Coordinated: Yes Fine Motor Movements are Fluid and Coordinated: Yes Motor  Motor Motor: Within Functional Limits Mobility  Transfers Sit to Stand: 7: Independent  Trunk/Postural Assessment  Cervical Assessment Cervical Assessment: Within Functional Limits Thoracic Assessment Thoracic Assessment: Within Functional Limits Lumbar Assessment Lumbar Assessment: Within Functional Limits Postural Control Postural Control: Within Functional Limits  Balance Static Standing Balance Static Standing - Level of Assistance: 7: Independent Dynamic Standing Balance Dynamic Standing - Level of Assistance: 7: Independent Extremity/Trunk Assessment RUE Assessment RUE Assessment: Within Functional Limits LUE Assessment LUE Assessment: Within Functional Limits  See FIM for current functional status  SAGUIER,JULIA 11/17/2012, 10:08 AM

## 2012-11-18 NOTE — Discharge Summary (Signed)
NAMEMARGUS, NINNEMAN                ACCOUNT NO.:  [id number]  MEDICAL RECORD NO.:  [id number]  LOCATION:  4033                         FACILITY:  [facility]  PHYSICIAN:  Erick Colace, M.D.DATE OF BIRTH:  09-May-1932  DATE OF ADMISSION:  11/13/2012 DATE OF DISCHARGE:  11/17/2012                              DISCHARGE SUMMARY   DISCHARGE DIAGNOSES: 1. Deconditioning, past cardiac arrest. 2. Status post ICD placement. 3. Hypertension. 4. Benign prostatic hypertrophy. 5. Acute-on-chronic iron deficiency anemia. 6. Hyponatremia. 7. Leukocytosis.  HISTORY OF PRESENT ILLNESS:  [name] is a 78 year old male with history of coronary artery disease who was admitted on October 31, 2012, after witnessed arrest at home, presumed VFib.  He was started on [place] protocol at admission, and required intubation for acute respiratory failure.  Workup revealed severe hypokinesis of basal, mid, posterior, anterior, lateral walls.  He required treatment with IV antibiotics for HCAP.  He had decreased level of responsiveness.  An EEG showed severe slowing of cerebral activity, compatible with severe encephalopathy on December 13.  He tolerated extubation, and has been upgraded to D3 diet, thin liquids.  He had ICD placed on December 18th, by Dr. Graciela Husbands.  Device function normal.  Therapies were initiated and patient is noted to be deconditioned, limited by hypoxia, as well as noted to have issues with delayed processing. CIR was consulted for progressive therapies.  PAST MEDICAL HISTORY:  Positive for coronary artery disease status post CABG in 2005, GERD, hypertension, dyslipidemia, BPH.  FUNCTIONAL HISTORY:  The patient was independent and active prior to admission.  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  FUNCTIONAL STATUS:  The patient is min assist for bed mobility, min assist transfers, min assist ambulating 8 plus 10 feet with  handheld assist.  He is requiring mod assist for upper body and lower body bathing and dressing tasks.  LABORATORY RESULTS:  Lytes from December 20, revealed sodium 135, potassium 3.6, chloride 100, CO2 of 29, BUN 13, creatinine 0.79, glucose 115.  CBC revealed H and H at 9.1 and 27.9, white count 12.6, platelets 504.  HOSPITAL COURSE:  [name] was admitted to Rehab on November 12, 2012, for inpatient therapies to consist of PT, OT, and speech therapy at least 3 hours 5 days a week.  Past admission, physiatrist, rehab RN, and therapy team have worked together to provide customized collaborative interdisciplinary care.  Rehab RN has worked with the patient on bowel and bladder program as well as wound care monitoring. Routine labs done past admission showed hyponatremia to have resolved. H and H was stable.  Leukocytosis was resolving.  Urine culture of December 17, showed no growth.  The patient has been afebrile.  His wound has been monitored and this has healed without any signs or symptoms of infection.  Blood pressures have been monitored on b.i.d. basis and have been reasonably controlled in the last 24 hours.  He was noted to have problems with tachycardia with heart rate ranging from mid 90s to 110 range.  He was noted to have increase in heart rate with  activity.  Not symptomatic.  His beta-blocker was increased to 100 mg p.o. b.i.d. at the time of discharge.  The patient has been continent of bowel and bladder, p.o. intake has been good.  During the patient's stay in rehab, team conferences were held to monitor patient's progress, set goals, as well as discussed barriers to discharge.  Therapy has worked with patient on activity tolerance as well as balance.  The patient is currently independent for transfers as well as all mobility without need of assisted device.  He is able to navigate 1 flight of stairs with supervision.  Family education was done with wife who  can provide intermittent assistance as needed past discharge.  OT has worked with the patient on self-care tasks.  The patient is at modified independent level for bathing and dressing.  Speech therapy evaluation done past admission revealed patient to have decreased recall of new information.  They have educated patient on use of external aids to help with recall as well as word-finding strategies. By the time of discharge, patient was modified independent for use of strategies.  No further followup speech therapy recommended past discharge.  On November 17, 2012, the patient was discharged to home in improved condition.  DISCHARGE MEDICATIONS: 1. Niferex 150 mg p.o. b.i.d. 2. Metoprolol 100 mg p.o. b.i.d. 3. Lipitor 40 mg p.o. every 2-3 days. 4. Tylenol 325-650 mg p.o. q.i.d. p.r.n. pain. 5. Uroxatral 10 mg p.o. per day. 6. Coated aspirin 81 mg p.o. per day. 7. Plavix 75 mg p.o. per day. 8. Valium 5 mg half to one p.o. q.6 hours p.r.n. anxiety. 9. Multivitamin 1 per day. 10.Zantac 75-150 mg p.o. daily as needed. 11.Colace 100 mg p.o. per day p.r.n. 12.Vitamin D 1000 units p.o. per day.  DIET:  Cardiac diet.  ACTIVITY LEVEL:  As tolerated.  No heavy lifting, pushing, or pulling on implant side for 2 months.  SPECIAL INSTRUCTIONS:  Neuro outpatient rehab to begin December 26th.  FOLLOWUP:  The patient to follow up with Dr. Posey Rea on January 7th, at 3:15.  Follow up for wound care check at [facility] on December 30th at 4 p.m.  Follow up with Dr. Sherryl Manges on March 25 at 9:45.  Follow up with Dr. Wynn Banker as needed.     [name] Christensen, P.A.   ______________________________ Erick Colace, M.D.    PL/MEDQ  D:  11/17/2012  T:  11/18/2012  Job:  [id number]  cc:   Georgina Quint. Plotnikov, MD Noralyn Pick. [name], MD, [facility] Duke Salvia, MD, [facility]

## 2012-11-19 ENCOUNTER — Ambulatory Visit: Payer: Medicare Other | Attending: Physical Medicine & Rehabilitation | Admitting: Physical Therapy

## 2012-11-19 DIAGNOSIS — Z8674 Personal history of sudden cardiac arrest: Secondary | ICD-10-CM | POA: Insufficient documentation

## 2012-11-19 DIAGNOSIS — IMO0001 Reserved for inherently not codable concepts without codable children: Secondary | ICD-10-CM | POA: Diagnosis not present

## 2012-11-19 DIAGNOSIS — R269 Unspecified abnormalities of gait and mobility: Secondary | ICD-10-CM | POA: Insufficient documentation

## 2012-11-23 ENCOUNTER — Ambulatory Visit (INDEPENDENT_AMBULATORY_CARE_PROVIDER_SITE_OTHER): Payer: Medicare Other | Admitting: *Deleted

## 2012-11-23 ENCOUNTER — Encounter: Payer: Self-pay | Admitting: Internal Medicine

## 2012-11-23 DIAGNOSIS — I509 Heart failure, unspecified: Secondary | ICD-10-CM

## 2012-11-23 DIAGNOSIS — I469 Cardiac arrest, cause unspecified: Secondary | ICD-10-CM

## 2012-11-23 LAB — ICD DEVICE OBSERVATION
BRDY-0002RV: 40 {beats}/min
CHARGE TIME: 7.1 s
DEV-0020ICD: NEGATIVE
DEVICE MODEL ICD: 7048849
FVT: 0
HV IMPEDENCE: 62 Ohm
PACEART VT: 0
RV LEAD AMPLITUDE: 10.1 mv
RV LEAD IMPEDENCE ICD: 487.5 Ohm
RV LEAD THRESHOLD: 1.25 V
TOT-0007: 0
TOT-0008: 0
TOT-0009: 0
TOT-0010: 0
TZAT-0001SLOWVT: 1
TZAT-0004SLOWVT: 8
TZAT-0012SLOWVT: 200 ms
TZAT-0013SLOWVT: 3
TZAT-0018SLOWVT: NEGATIVE
TZAT-0019SLOWVT: 7.5 V
TZAT-0020SLOWVT: 1 ms
TZON-0003SLOWVT: 300 ms
TZON-0004SLOWVT: 30
TZON-0005SLOWVT: 6
TZON-0010SLOWVT: 40 ms
TZST-0001SLOWVT: 2
TZST-0001SLOWVT: 3
TZST-0001SLOWVT: 4
TZST-0001SLOWVT: 5
TZST-0003SLOWVT: 30 J
TZST-0003SLOWVT: 36 J
TZST-0003SLOWVT: 36 J
TZST-0003SLOWVT: 36 J
VENTRICULAR PACING ICD: 0 pct
VF: 0

## 2012-11-23 NOTE — Progress Notes (Signed)
Wound check defib in clinic  

## 2012-11-25 DIAGNOSIS — 419620001 Death: Secondary | SNOMED CT

## 2012-11-25 DEATH — deceased

## 2012-12-01 ENCOUNTER — Ambulatory Visit (INDEPENDENT_AMBULATORY_CARE_PROVIDER_SITE_OTHER)
Admission: RE | Admit: 2012-12-01 | Discharge: 2012-12-01 | Disposition: A | Discharge status: Home / Self Care | Payer: Medicare Other | Source: Ambulatory Visit | Attending: Internal Medicine | Admitting: Internal Medicine

## 2012-12-01 ENCOUNTER — Encounter: Payer: Self-pay | Admitting: Internal Medicine

## 2012-12-01 ENCOUNTER — Ambulatory Visit (INDEPENDENT_AMBULATORY_CARE_PROVIDER_SITE_OTHER): Payer: Medicare Other | Admitting: Internal Medicine

## 2012-12-01 ENCOUNTER — Telehealth: Payer: Self-pay | Admitting: Internal Medicine

## 2012-12-01 VITALS — BP 152/82 | HR 76 | Temp 97.4°F | Resp 16 | Wt 144.0 lb

## 2012-12-01 DIAGNOSIS — I469 Cardiac arrest, cause unspecified: Secondary | ICD-10-CM

## 2012-12-01 DIAGNOSIS — I4901 Ventricular fibrillation: Secondary | ICD-10-CM

## 2012-12-01 DIAGNOSIS — I10 Essential (primary) hypertension: Secondary | ICD-10-CM | POA: Diagnosis not present

## 2012-12-01 DIAGNOSIS — R059 Cough, unspecified: Secondary | ICD-10-CM

## 2012-12-01 DIAGNOSIS — R05 Cough: Secondary | ICD-10-CM | POA: Diagnosis not present

## 2012-12-01 DIAGNOSIS — R209 Unspecified disturbances of skin sensation: Secondary | ICD-10-CM

## 2012-12-01 DIAGNOSIS — Z0000 Encounter for general adult medical examination without abnormal findings: Secondary | ICD-10-CM

## 2012-12-01 DIAGNOSIS — I2510 Atherosclerotic heart disease of native coronary artery without angina pectoris: Secondary | ICD-10-CM

## 2012-12-01 DIAGNOSIS — E559 Vitamin D deficiency, unspecified: Secondary | ICD-10-CM

## 2012-12-01 DIAGNOSIS — J189 Pneumonia, unspecified organism: Secondary | ICD-10-CM

## 2012-12-01 DIAGNOSIS — N320 Bladder-neck obstruction: Secondary | ICD-10-CM

## 2012-12-01 DIAGNOSIS — R202 Paresthesia of skin: Secondary | ICD-10-CM

## 2012-12-01 DIAGNOSIS — R918 Other nonspecific abnormal finding of lung field: Secondary | ICD-10-CM | POA: Diagnosis not present

## 2012-12-01 MED ORDER — GUAIFENESIN ER 600 MG PO TB12: 1200.0000 mg | ORAL_TABLET | Freq: Two times a day (BID) | ORAL | Status: DC

## 2012-12-01 MED ORDER — DOXYCYCLINE HYCLATE 100 MG PO TABS: 100.0000 mg | ORAL_TABLET | Freq: Two times a day (BID) | ORAL | Status: DC

## 2012-12-01 MED ORDER — FLUTICASONE FUROATE-VILANTEROL 100-25 MCG/INH IN AEPB: 1.0000 | INHALATION_SPRAY | Freq: Every day | RESPIRATORY_TRACT | Status: DC

## 2012-12-01 MED ORDER — PROMETHAZINE-CODEINE 6.25-10 MG/5ML PO SYRP: 5.0000 mL | ORAL_SOLUTION | ORAL | Status: DC | PRN

## 2012-12-01 NOTE — Assessment & Plan Note (Signed)
Continue with current prescription therapy as reflected on the Med list. RTC 1 mo

## 2012-12-01 NOTE — Assessment & Plan Note (Signed)
Continue with current prescription therapy as reflected on the Med list.  

## 2012-12-01 NOTE — Assessment & Plan Note (Signed)
S/p CPR

## 2012-12-01 NOTE — Telephone Encounter (Signed)
Misty Stanley, please, inform patient that he has a possible pneumonia on CXR - take abx and other meds (I left a VM on his cell - not sure if it is working) [employer]

## 2012-12-01 NOTE — Assessment & Plan Note (Signed)
1/14 ?etiology - post- intubation x  6 d

## 2012-12-01 NOTE — Progress Notes (Signed)
Subjective:   HPI  Post-hosp f/u -- cardiac arrest on 10/26/2012  [name] is a 79 year old male with  history of coronary artery disease who was admitted on October 31, 2012,  after witnessed arrest at home, presumed VFib. He was started on [place] protocol at admission, and required intubation for acute respiratory  failure. Workup revealed severe hypokinesis of basal, mid, posterior,  anterior, lateral walls. He required treatment with IV antibiotics for  HCAP. He had decreased level of responsiveness. An EEG showed severe  slowing of cerebral activity, compatible with severe encephalopathy on  December 13. He tolerated extubation, and has been upgraded to D3 diet,  thin liquids. He had ICD placed on December 18th, by Dr. Graciela Christensen. Device  function normal. Therapies were initiated and patient is noted to be  deconditioned, limited by hypoxia, as well as noted to have issues with  delayed processing.  C/o bad cough x 2-3 wks, CP w/cough R SBP 140 at home    The patient presents for a follow-up of  chronic BPH, hypertension, chronic dyslipidemia,CAD controlled with medicines  BP Readings from Last 3 Encounters:  12/25/2012 152/82  11/17/12 150/73  11/21/2012 150/70   Wt Readings from Last 3 Encounters:  12/06/2012 144 lb (65.318 kg)  11/16/12 140 lb 3.4 oz (63.6 kg)  11/22/2012 132 lb 7.9 oz (60.1 kg)      Review of Systems  Constitutional: Negative for appetite change, fatigue and unexpected weight change.  HENT: Negative for nosebleeds, congestion, sore throat, sneezing, trouble swallowing and neck pain.   Eyes: Negative for itching and visual disturbance.  Respiratory: Negative for cough.   Cardiovascular: Negative for chest pain, palpitations and leg swelling.  Gastrointestinal: Negative for nausea, diarrhea, blood in stool and abdominal distention.  Genitourinary: Negative for frequency and hematuria.  Musculoskeletal: Negative for back pain, joint swelling and  gait problem.  Skin: Negative for rash.  Neurological: Negative for dizziness, tremors, speech difficulty and weakness.  Psychiatric/Behavioral: Negative for sleep disturbance, dysphoric mood and agitation. The patient is not nervous/anxious.        Objective:   Physical Exam  Constitutional: He is oriented to person, place, and time. He appears well-developed and well-nourished. No distress.  HENT:  Head: Normocephalic and atraumatic.  Right Ear: External ear normal.  Left Ear: External ear normal.  Nose: Nose normal.  Mouth/Throat: Oropharynx is clear and moist. No oropharyngeal exudate.  Eyes: Conjunctivae normal and EOM are normal. Pupils are equal, round, and reactive to light. Right eye exhibits no discharge. Left eye exhibits no discharge. No scleral icterus.  Neck: Normal range of motion. Neck supple. No JVD present. No tracheal deviation present. No thyromegaly present.  Cardiovascular: Normal rate, regular rhythm, normal heart sounds and intact distal pulses.  Exam reveals no gallop and no friction rub.   No murmur heard. Pulmonary/Chest: Effort normal and breath sounds normal. No stridor. No respiratory distress. He has no wheezes. He has no rales. He exhibits no tenderness.  Abdominal: Soft. Bowel sounds are normal. He exhibits no distension and no mass. There is no tenderness. There is no rebound and no guarding.  Genitourinary: Rectum normal, prostate normal and penis normal. Guaiac negative stool. No penile tenderness.  Musculoskeletal: Normal range of motion. He exhibits no edema and no tenderness.  Lymphadenopathy:    He has no cervical adenopathy.  Neurological: He is alert and oriented to person, place, and time. He has normal reflexes. No cranial nerve deficit. He exhibits  normal muscle tone. Coordination normal.  Skin: Skin is warm and dry. No rash noted. He is not diaphoretic. No erythema. No pallor.  Psychiatric: He has a normal mood and affect. His behavior is  normal. Judgment and thought content normal.    Lab Results  Component Value Date   WBC 12.6* 11/13/2012   HGB 9.1* 11/13/2012   HCT 27.9* 11/13/2012   PLT 504* 11/13/2012   GLUCOSE 115* 11/13/2012   CHOL 141 07/20/2012   TRIG 55.0 07/20/2012   HDL 43.60 07/20/2012   LDLCALC 86 07/20/2012   ALT 24 11/13/2012   AST 36 11/13/2012   NA 135 11/13/2012   K 3.6 11/13/2012   CL 100 11/13/2012   CREATININE 0.79 11/13/2012   BUN 13 11/13/2012   CO2 29 11/13/2012   TSH 1.360 11/21/2012   PSA 8.26* 12/18/2011   INR 1.32 11-27-2012   HGBA1C 6.3* 10/25/2012   A complex case       Assessment & Plan:

## 2012-12-01 NOTE — Assessment & Plan Note (Signed)
Healed well. 

## 2012-12-01 NOTE — Assessment & Plan Note (Signed)
CXR Will try Doxy first - may need to change abx if no response

## 2012-12-02 NOTE — Telephone Encounter (Signed)
Pt informed

## 2012-12-14 ENCOUNTER — Other Ambulatory Visit (INDEPENDENT_AMBULATORY_CARE_PROVIDER_SITE_OTHER): Payer: Medicare Other

## 2012-12-14 ENCOUNTER — Encounter: Payer: Self-pay | Admitting: Internal Medicine

## 2012-12-14 ENCOUNTER — Ambulatory Visit (INDEPENDENT_AMBULATORY_CARE_PROVIDER_SITE_OTHER): Payer: Medicare Other | Admitting: Internal Medicine

## 2012-12-14 ENCOUNTER — Ambulatory Visit (INDEPENDENT_AMBULATORY_CARE_PROVIDER_SITE_OTHER)
Admission: RE | Admit: 2012-12-14 | Discharge: 2012-12-14 | Disposition: A | Discharge status: Home / Self Care | Payer: Medicare Other | Source: Ambulatory Visit | Attending: Internal Medicine | Admitting: Internal Medicine

## 2012-12-14 VITALS — BP 120/70 | HR 88 | Temp 97.1°F | Resp 16 | Wt 145.0 lb

## 2012-12-14 DIAGNOSIS — R05 Cough: Secondary | ICD-10-CM | POA: Diagnosis not present

## 2012-12-14 DIAGNOSIS — R6883 Chills (without fever): Secondary | ICD-10-CM | POA: Diagnosis not present

## 2012-12-14 DIAGNOSIS — R059 Cough, unspecified: Secondary | ICD-10-CM | POA: Diagnosis not present

## 2012-12-14 DIAGNOSIS — I10 Essential (primary) hypertension: Secondary | ICD-10-CM

## 2012-12-14 DIAGNOSIS — J189 Pneumonia, unspecified organism: Secondary | ICD-10-CM

## 2012-12-14 DIAGNOSIS — R0989 Other specified symptoms and signs involving the circulatory and respiratory systems: Secondary | ICD-10-CM | POA: Diagnosis not present

## 2012-12-14 LAB — URINALYSIS
Bilirubin Urine: NEGATIVE
Hgb urine dipstick: NEGATIVE
Ketones, ur: NEGATIVE
Leukocytes, UA: NEGATIVE
Nitrite: NEGATIVE
Specific Gravity, Urine: 1.015 (ref 1.000–1.030)
Total Protein, Urine: NEGATIVE
Urine Glucose: NEGATIVE
Urobilinogen, UA: 0.2 (ref 0.0–1.0)
pH: 6.5 (ref 5.0–8.0)

## 2012-12-14 LAB — BASIC METABOLIC PANEL
BUN: 13 mg/dL (ref 6–23)
CO2: 30 mEq/L (ref 19–32)
Calcium: 10.1 mg/dL (ref 8.4–10.5)
Chloride: 97 mEq/L (ref 96–112)
Creatinine, Ser: 1 mg/dL (ref 0.4–1.5)
GFR: 80.48 mL/min (ref >60.00)
Glucose, Bld: 101 mg/dL — ABNORMAL HIGH (ref 70–99)
Potassium: 5 mEq/L (ref 3.5–5.1)
Sodium: 132 mEq/L — ABNORMAL LOW (ref 135–145)

## 2012-12-14 LAB — CBC WITH DIFFERENTIAL/PLATELET
Basophils Absolute: 0 10*3/uL (ref 0.0–0.1)
Basophils Relative: 0.5 % (ref 0.0–3.0)
Eosinophils Absolute: 0.7 10*3/uL (ref 0.0–0.7)
Eosinophils Relative: 8.3 % — ABNORMAL HIGH (ref 0.0–5.0)
HCT: 33.2 % — ABNORMAL LOW (ref 39.0–52.0)
Hemoglobin: 10.9 g/dL — ABNORMAL LOW (ref 13.0–17.0)
Lymphocytes Relative: 29.6 % (ref 12.0–46.0)
Lymphs Abs: 2.7 10*3/uL (ref 0.7–4.0)
MCHC: 32.9 g/dL (ref 30.0–36.0)
MCV: 85.9 fl (ref 78.0–100.0)
Monocytes Absolute: 0.9 10*3/uL (ref 0.1–1.0)
Monocytes Relative: 10.3 % (ref 3.0–12.0)
Neutro Abs: 4.6 10*3/uL (ref 1.4–7.7)
Neutrophils Relative %: 51.3 % (ref 43.0–77.0)
Platelets: 234 10*3/uL (ref 150.0–400.0)
RBC: 3.86 Mil/uL — ABNORMAL LOW (ref 4.22–5.81)
RDW: 14.9 % — ABNORMAL HIGH (ref 11.5–14.6)
WBC: 9 10*3/uL (ref 4.5–10.5)

## 2012-12-14 MED ORDER — TRAMADOL HCL 50 MG PO TABS: 50.0000 mg | ORAL_TABLET | Freq: Two times a day (BID) | ORAL | Status: DC | PRN

## 2012-12-14 MED ORDER — LEVOFLOXACIN 500 MG PO TABS: 500.0000 mg | ORAL_TABLET | Freq: Every day | ORAL | Status: DC

## 2012-12-14 MED ORDER — ATENOLOL 50 MG PO TABS: 50.0000 mg | ORAL_TABLET | Freq: Every day | ORAL | Status: DC

## 2012-12-14 NOTE — Progress Notes (Signed)
Subjective:   HPI  C/o spastic cough w/deep breaths - not better. C/o chills, sweats, achy feeling. Breo, doxy did not help... C/o R CP at times, bad  (call Kathie Rhodes - wife - [phone])  Wt Readings from Last 3 Encounters:  12/14/12 145 lb (65.772 kg)  12/02/2012 144 lb (65.318 kg)  11/16/12 140 lb 3.4 oz (63.6 kg)   BP Readings from Last 3 Encounters:  12/14/12 120/70  12/25/2012 152/82  11/17/12 150/73     Post-hosp f/u -- cardiac arrest on 11/22/2012  Mr. Horatio Royle is a 79 year old male with  history of coronary artery disease who was admitted on October 31, 2012,  after witnessed arrest at home, presumed VFib. He was started on [place] protocol at admission, and required intubation for acute respiratory  failure. Workup revealed severe hypokinesis of basal, mid, posterior,  anterior, lateral walls. He required treatment with IV antibiotics for  HCAP. He had decreased level of responsiveness. An EEG showed severe  slowing of cerebral activity, compatible with severe encephalopathy on  December 13. He tolerated extubation, and has been upgraded to D3 diet,  thin liquids. He had ICD placed on December 18th, by Dr. Graciela Husbands. Device  function normal. Therapies were initiated and patient is noted to be  deconditioned, limited by hypoxia, as well as noted to have issues with  delayed processing.     The patient presents for a follow-up of  chronic BPH, hypertension, chronic dyslipidemia,CAD controlled with medicines       Review of Systems  Constitutional: Negative for appetite change, fatigue and unexpected weight change.  HENT: Negative for nosebleeds, congestion, sore throat, sneezing, trouble swallowing and neck pain.   Eyes: Negative for itching and visual disturbance.  Respiratory: Negative for cough.   Cardiovascular: Negative for chest pain, palpitations and leg swelling.  Gastrointestinal: Negative for nausea, diarrhea, blood in stool and abdominal  distention.  Genitourinary: Negative for frequency and hematuria.  Musculoskeletal: Negative for back pain, joint swelling and gait problem.  Skin: Negative for rash.  Neurological: Negative for dizziness, tremors, speech difficulty and weakness.  Psychiatric/Behavioral: Negative for sleep disturbance, dysphoric mood and agitation. The patient is not nervous/anxious.   coughing      Objective:   Physical Exam  Constitutional: He is oriented to person, place, and time. He appears well-developed and well-nourished. No distress.  HENT:  Head: Normocephalic and atraumatic.  Right Ear: External ear normal.  Left Ear: External ear normal.  Nose: Nose normal.  Mouth/Throat: Oropharynx is clear and moist. No oropharyngeal exudate.  Eyes: Conjunctivae normal and EOM are normal. Pupils are equal, round, and reactive to light. Right eye exhibits no discharge. Left eye exhibits no discharge. No scleral icterus.  Neck: Normal range of motion. Neck supple. No JVD present. No tracheal deviation present. No thyromegaly present.  Cardiovascular: Normal rate, regular rhythm, normal heart sounds and intact distal pulses.  Exam reveals no gallop and no friction rub.   No murmur heard. Pulmonary/Chest: Effort normal and breath sounds normal. No stridor. No respiratory distress. He has no wheezes. He has no rales. He exhibits no tenderness.  Abdominal: Soft. Bowel sounds are normal. He exhibits no distension and no mass. There is no tenderness. There is no rebound and no guarding.  Genitourinary: Rectum normal, prostate normal and penis normal. Guaiac negative stool. No penile tenderness.  Musculoskeletal: Normal range of motion. He exhibits no edema and no tenderness.  Lymphadenopathy:    He has no cervical  adenopathy.  Neurological: He is alert and oriented to person, place, and time. He has normal reflexes. No cranial nerve deficit. He exhibits normal muscle tone. Coordination normal.  Skin: Skin is warm  and dry. No rash noted. He is not diaphoretic. No erythema. No pallor.  Psychiatric: He has a normal mood and affect. His behavior is normal. Judgment and thought content normal.    Lab Results  Component Value Date   WBC 12.6* 11/13/2012   HGB 9.1* 11/13/2012   HCT 27.9* 11/13/2012   PLT 504* 11/13/2012   GLUCOSE 115* 11/13/2012   CHOL 141 07/20/2012   TRIG 55.0 07/20/2012   HDL 43.60 07/20/2012   LDLCALC 86 07/20/2012   ALT 24 11/13/2012   AST 36 11/13/2012   NA 135 11/13/2012   K 3.6 11/13/2012   CL 100 11/13/2012   CREATININE 0.79 11/13/2012   BUN 13 11/13/2012   CO2 29 11/13/2012   TSH 1.360 11/14/2012   PSA 8.26* 12/18/2011   INR 1.32 10/30/2012   HGBA1C 6.3* 11/21/2012   A complex case  Procedure Note :    Procedure :   Point of care (POC) sonography examination   Indication: CP, cough   Equipment used: Sonosite M-Turbo with HFL38x/13-6 MHz transducer and the array probes. The images were stored in the unit and later transferred in storage.  The patient was placed in a decubitus and sitting position.  This study revealed no pleural effusion B. No pneumothorax. There were B lines on both sides.   Impression: This study revealed no pleural effusion B. No pneumothorax. There were multiple B lines on both sides c/w infection or CHF.              Assessment & Plan:

## 2012-12-14 NOTE — Assessment & Plan Note (Signed)
Not better - ?refractory pneumonia CXR [place] Tramadol prn Change to Atenolol  Change to Levaquin Pulm consult

## 2012-12-14 NOTE — Assessment & Plan Note (Signed)
Change to Atenolol

## 2012-12-14 NOTE — Assessment & Plan Note (Addendum)
Not better - ?refractory pneumonia CXR US Tramadol prn Change to Atenolol  Change to Levaquin Pulm consult 

## 2012-12-16 ENCOUNTER — Telehealth: Payer: Self-pay | Admitting: Internal Medicine

## 2012-12-16 ENCOUNTER — Encounter: Payer: Self-pay | Admitting: Internal Medicine

## 2012-12-16 ENCOUNTER — Ambulatory Visit (INDEPENDENT_AMBULATORY_CARE_PROVIDER_SITE_OTHER): Payer: Medicare Other | Admitting: Internal Medicine

## 2012-12-16 VITALS — BP 120/86 | HR 75 | Temp 96.7°F | Ht 68.0 in | Wt 145.2 lb

## 2012-12-16 DIAGNOSIS — J189 Pneumonia, unspecified organism: Secondary | ICD-10-CM

## 2012-12-16 MED ORDER — RANITIDINE HCL 150 MG PO TABS: ORAL_TABLET | ORAL | Status: DC

## 2012-12-16 NOTE — Patient Instructions (Addendum)
Take zantac (ranitidine) 150 mg after bfast and after supper whether you think you need it or not for now  GERD (REFLUX)  is an extremely common cause of respiratory symptoms, many times with no significant heartburn at all.    It can be treated with medication, but also with lifestyle changes including avoidance of late meals, excessive alcohol, smoking cessation, and avoid fatty foods, chocolate, peppermint, colas, red wine, and acidic juices such as orange juice.  NO MINT OR MENTHOL PRODUCTS SO NO COUGH DROPS  USE SUGARLESS CANDY INSTEAD (jolley ranchers or Stover's)  NO OIL BASED VITAMINS - use powdered substitutes.  For cough use tramadol 50 mg one every 4 hours if needed   Please schedule a follow up office visit in 2 weeks, sooner if needed with chest xray on return

## 2012-12-16 NOTE — Telephone Encounter (Signed)
I can see him in 4-6 wks if Dr Sherene Sires is going to see him for his cough f/up Thx

## 2012-12-16 NOTE — Telephone Encounter (Signed)
Pt wants dr to know that Dr Sherene Sires told him to cancel his appt with Dr Posey Rea on 2/7, does he still need to be seen?

## 2012-12-16 NOTE — Progress Notes (Signed)
Subjective:    Patient ID: [name] Christensen, male    DOB: [birth year] MRN: [id number]  HPI  57 yowm never smoker s/p cardiac arrest Oct 31 2012 requiring artic sun and ever since Dec 17 placement of debrillator every deep makes him cough> eval with ? pna 1/20  > rx levaquin some better   referred 12/16/2012 to pulmonary clinic by Dr Posey Rea    12/16/2012 1st pulmonary eval cc intermittent chills, severe cough since Dec 17 when takes breath in started levquin 1/20with ? pna and starting to feel better, sob only with exertion, no excess mucus production  No obvious daytime variabilty or assoc  cp or chest tightness, subjective wheeze overt sinus or hb symptoms. No unusual exp hx or h/o childhood pna/ asthma or premature birth to his knowledge.   Sleeping ok without nocturnal  or early am exacerbation  of respiratory  c/o's or need for noct saba. Also denies any obvious fluctuation of symptoms with weather or environmental changes or other aggravating or alleviating factors except as outlined above     Review of Systems  Constitutional: Negative for fever, chills, activity change, appetite change and unexpected weight change.  HENT: Negative for congestion, sore throat, rhinorrhea, sneezing, trouble swallowing, dental problem, voice change and postnasal drip.   Eyes: Negative for visual disturbance.  Respiratory: Positive for cough and shortness of breath. Negative for choking.   Cardiovascular: Negative for chest pain and leg swelling.  Gastrointestinal: Negative for nausea, vomiting and abdominal pain.  Genitourinary: Negative for difficulty urinating.  Musculoskeletal: Negative for arthralgias.  Skin: Negative for rash.  Psychiatric/Behavioral: Negative for behavioral problems and confusion.       Objective:   Physical Exam Wt Readings from Last 3 Encounters:  12/16/12 145 lb 3.2 oz (65.862 kg)  12/14/12 145 lb (65.772 kg)  11/29/2012 144 lb (65.318 kg)    HEENT: nl dentition,  turbinates, and orophanx. Nl external ear canals without cough reflex   NECK :  without JVD/Nodes/TM/ nl carotid upstrokes bilaterally   LUNGS: no acc muscle use, clear to A and P bilaterally without cough on insp or exp maneuvers   CV:  RRR  no s3 or murmur or increase in P2, no edema   ABD:  soft and nontender with nl excursion in the supine position. No bruits or organomegaly, bowel sounds nl  MS:  warm without deformities, calf tenderness, cyanosis or clubbing  SKIN: warm and dry without lesions    NEURO:  alert, approp, no deficits    cxr 12/14/12 Findings worrisome for at least persistent and possibly progressive  multifocal infection, including atypical etiologies. A follow-up  chest radiograph in 4 to 6 weeks after treatment is recommended to  ensure resolution.      Assessment & Plan:

## 2012-12-16 NOTE — Telephone Encounter (Signed)
Pt informed

## 2012-12-17 NOTE — Assessment & Plan Note (Signed)
Course is unusual but this probably represents some form of asp or hcap this is already improving on levaquin.  BOOP is another possibility but whatever is it is seems to be improving  ? If gerd/ asp playing a role so should not be using any oil based vit (lipoid pna a concern)  General gerd guidelines reviewed    Each maintenance medication was reviewed in detail including most importantly the difference between maintenance and as needed and under what circumstances the prns are to be used.  Please see instructions for details which were reviewed in writing and the patient given a copy.

## 2012-12-18 ENCOUNTER — Telehealth: Payer: Self-pay | Admitting: *Deleted

## 2012-12-18 NOTE — Telephone Encounter (Signed)
Caller left vm stating pt is not qualified for the monitored program since he has not had recent CHF with cardiac cath, stenting or MRI. She did state he fits the need for non cardiac program. She is sending over a blank referral that will need to be completed in addition to order in Epic if MD feels he could benefit from this.

## 2012-12-19 NOTE — Telephone Encounter (Signed)
Noted. Thx.

## 2012-12-25 ENCOUNTER — Encounter (INDEPENDENT_AMBULATORY_CARE_PROVIDER_SITE_OTHER): Payer: Medicare Other

## 2012-12-25 DIAGNOSIS — I6529 Occlusion and stenosis of unspecified carotid artery: Secondary | ICD-10-CM | POA: Diagnosis not present

## 2012-12-26 DIAGNOSIS — 419620001 Death: Secondary | SNOMED CT | POA: Diagnosis not present

## 2012-12-26 DEATH — deceased

## 2012-12-28 ENCOUNTER — Telehealth: Payer: Self-pay | Admitting: Internal Medicine

## 2012-12-28 ENCOUNTER — Encounter: Payer: Medicare Other | Admitting: Internal Medicine

## 2012-12-28 NOTE — Telephone Encounter (Signed)
[name] Christensen, please, inform patient that his carotid US is not bad: repeat in 12 mo Thx

## 2012-12-29 NOTE — Telephone Encounter (Signed)
Pt informed

## 2012-12-30 ENCOUNTER — Other Ambulatory Visit: Payer: Self-pay | Admitting: *Deleted

## 2012-12-30 MED ORDER — CLOPIDOGREL BISULFATE 75 MG PO TABS: 75.0000 mg | ORAL_TABLET | Freq: Every day | ORAL | Status: DC

## 2012-12-31 ENCOUNTER — Encounter: Payer: Self-pay | Admitting: Internal Medicine

## 2012-12-31 ENCOUNTER — Other Ambulatory Visit (INDEPENDENT_AMBULATORY_CARE_PROVIDER_SITE_OTHER): Payer: Medicare Other

## 2012-12-31 ENCOUNTER — Ambulatory Visit (INDEPENDENT_AMBULATORY_CARE_PROVIDER_SITE_OTHER): Payer: Medicare Other | Admitting: Internal Medicine

## 2012-12-31 ENCOUNTER — Ambulatory Visit (INDEPENDENT_AMBULATORY_CARE_PROVIDER_SITE_OTHER)
Admission: RE | Admit: 2012-12-31 | Discharge: 2012-12-31 | Disposition: A | Discharge status: Home / Self Care | Payer: Medicare Other | Source: Ambulatory Visit | Attending: Internal Medicine | Admitting: Internal Medicine

## 2012-12-31 VITALS — BP 120/72 | HR 72 | Temp 97.6°F | Ht 68.0 in | Wt 146.6 lb

## 2012-12-31 DIAGNOSIS — R05 Cough: Secondary | ICD-10-CM | POA: Diagnosis not present

## 2012-12-31 DIAGNOSIS — J189 Pneumonia, unspecified organism: Secondary | ICD-10-CM

## 2012-12-31 DIAGNOSIS — R059 Cough, unspecified: Secondary | ICD-10-CM | POA: Diagnosis not present

## 2012-12-31 LAB — CBC WITH DIFFERENTIAL/PLATELET
Basophils Absolute: 0 10*3/uL (ref 0.0–0.1)
Basophils Relative: 0.4 % (ref 0.0–3.0)
Eosinophils Absolute: 0.3 10*3/uL (ref 0.0–0.7)
Eosinophils Relative: 3.7 % (ref 0.0–5.0)
HCT: 33.1 % — ABNORMAL LOW (ref 39.0–52.0)
Hemoglobin: 11.1 g/dL — ABNORMAL LOW (ref 13.0–17.0)
Lymphocytes Relative: 28.6 % (ref 12.0–46.0)
Lymphs Abs: 2.3 10*3/uL (ref 0.7–4.0)
MCHC: 33.5 g/dL (ref 30.0–36.0)
MCV: 84.8 fl (ref 78.0–100.0)
Monocytes Absolute: 0.9 10*3/uL (ref 0.1–1.0)
Monocytes Relative: 11.1 % (ref 3.0–12.0)
Neutro Abs: 4.5 10*3/uL (ref 1.4–7.7)
Neutrophils Relative %: 56.2 % (ref 43.0–77.0)
Platelets: 184 10*3/uL (ref 150.0–400.0)
RBC: 3.9 Mil/uL — ABNORMAL LOW (ref 4.22–5.81)
RDW: 14.6 % (ref 11.5–14.6)
WBC: 8 10*3/uL (ref 4.5–10.5)

## 2012-12-31 LAB — SEDIMENTATION RATE: Sed Rate: 50 mm/hr — ABNORMAL HIGH (ref 0–22)

## 2012-12-31 NOTE — Assessment & Plan Note (Signed)
I had an extended summary discussion with the patient and wife [name] Christensen today lasting 15 to 20 minutes of a 25 minute visit on the following issues:  Not entirely clear this was HCAP or CAP but appropriately treated for both.  Could also be post ALI "organizing pna" evolving to a BOOP-like illness or even eos pna so will check baseline esr and eos and rec f/u cxr in one month, sooner if clinical decline

## 2012-12-31 NOTE — Progress Notes (Signed)
Subjective:    Patient ID: [name] Christensen, male    DOB: 30-May-1932  MRN: [id number]  HPI  89 yowm never smoker s/p cardiac arrest Oct 31 2012 requiring artic sun and ever since Dec 17 placement of debrillator every deep makes him cough> eval with ? pna 1/20  > rx levaquin some better  referred 12/16/2012 to pulmonary clinic by Dr Posey Rea.   12/16/2012 1st pulmonary eval cc intermittent chills, severe cough since Dec 17 when takes breath in started levquin 1/20with ? pna and starting to feel better, sob only with exertion, no excess mucus production. rec Take zantac (ranitidine) 150 mg after bfast and after supper whether you think you need it or not for now GERD  For cough use tramadol 50 mg one every 4 hours if needed    12/31/2012 f/u ov/[name] Christensen cc overall much better, min grey mucus mostly after stirring, some chills, finished levaquin 2/2   No obvious daytime variabilty or assoc  Sob or cp or chest tightness, subjective wheeze overt sinus or hb symptoms. No unusual exp hx or h/o childhood pna/ asthma or premature birth to his knowledge.   Sleeping ok without nocturnal  or early am exacerbation  of respiratory  c/o's or need for noct saba. Also denies any obvious fluctuation of symptoms with weather or environmental changes or other aggravating or alleviating factors except as outlined above   ROS  The following are not active complaints unless bolded sore throat, dysphagia, dental problems, itching, sneezing,  nasal congestion or excess/ purulent secretions, ear ache,   fever, chills, sweats, unintended wt loss, pleuritic or exertional cp, hemoptysis,  orthopnea pnd or leg swelling, presyncope, palpitations, heartburn, abdominal pain, anorexia, nausea, vomiting, diarrhea  or change in bowel or urinary habits, change in stools or urine, dysuria,hematuria,  rash, arthralgias, visual complaints, headache, numbness weakness or ataxia or problems with walking or coordination,  change in mood/affect  or memory.               Objective:   Physical Exam  Wt 146 12/31/2012  Wt Readings from Last 3 Encounters:  12/16/12 145 lb 3.2 oz (65.862 kg)  12/14/12 145 lb (65.772 kg)  11/25/2012 144 lb (65.318 kg)    HEENT: nl dentition, turbinates, and orophanx. Nl external ear canals without cough reflex   NECK :  without JVD/Nodes/TM/ nl carotid upstrokes bilaterally   LUNGS: no acc muscle use, clear to A and P bilaterally without cough on insp or exp maneuvers   CV:  RRR  no s3 or murmur or increase in P2, no edema   ABD:  soft and nontender with nl excursion in the supine position. No bruits or organomegaly, bowel sounds nl  MS:  warm without deformities, calf tenderness, cyanosis or clubbing  SKIN: warm and dry without lesions    NEURO:  alert, approp, no deficits    CXR  12/31/2012 :   Persistent ill-defined interstitial and airspace process. Recommend CT for further evaluation.        Assessment & Plan:

## 2012-12-31 NOTE — Patient Instructions (Addendum)
Please remember to go to the lab and x-ray department downstairs for your tests - we will call you with the results when they are available.    Don't stop the zantac until 100% better for at least a week without the need for any cough medication  Pulmonary follow up can be as needed

## 2013-01-01 ENCOUNTER — Ambulatory Visit: Payer: Medicare Other | Admitting: Internal Medicine

## 2013-01-11 ENCOUNTER — Encounter (HOSPITAL_COMMUNITY)
Admission: RE | Admit: 2013-01-11 | Discharge: 2013-01-11 | Disposition: A | Discharge status: Home / Self Care | Payer: Self-pay | Source: Ambulatory Visit | Attending: Internal Medicine | Admitting: Internal Medicine

## 2013-01-11 ENCOUNTER — Other Ambulatory Visit: Payer: Self-pay | Admitting: *Deleted

## 2013-01-11 ENCOUNTER — Other Ambulatory Visit: Payer: Self-pay

## 2013-01-11 DIAGNOSIS — E875 Hyperkalemia: Secondary | ICD-10-CM

## 2013-01-11 DIAGNOSIS — Z95810 Presence of automatic (implantable) cardiac defibrillator: Secondary | ICD-10-CM | POA: Insufficient documentation

## 2013-01-11 DIAGNOSIS — E785 Hyperlipidemia, unspecified: Secondary | ICD-10-CM

## 2013-01-11 DIAGNOSIS — I2510 Atherosclerotic heart disease of native coronary artery without angina pectoris: Secondary | ICD-10-CM | POA: Insufficient documentation

## 2013-01-11 DIAGNOSIS — Z951 Presence of aortocoronary bypass graft: Secondary | ICD-10-CM

## 2013-01-11 DIAGNOSIS — I10 Essential (primary) hypertension: Secondary | ICD-10-CM | POA: Insufficient documentation

## 2013-01-11 DIAGNOSIS — Z5189 Encounter for other specified aftercare: Secondary | ICD-10-CM | POA: Insufficient documentation

## 2013-01-11 MED ORDER — ATORVASTATIN CALCIUM 40 MG PO TABS: 40.0000 mg | ORAL_TABLET | ORAL | Status: DC

## 2013-01-13 ENCOUNTER — Other Ambulatory Visit: Payer: Self-pay | Admitting: *Deleted

## 2013-01-13 ENCOUNTER — Encounter (HOSPITAL_COMMUNITY)
Admission: RE | Admit: 2013-01-13 | Discharge: 2013-01-13 | Disposition: A | Discharge status: Home / Self Care | Payer: Self-pay | Source: Ambulatory Visit | Attending: Internal Medicine | Admitting: Internal Medicine

## 2013-01-13 MED ORDER — POLYSACCHARIDE IRON COMPLEX 150 MG PO CAPS: 150.0000 mg | ORAL_CAPSULE | Freq: Two times a day (BID) | ORAL | Status: DC

## 2013-01-14 DIAGNOSIS — L820 Inflamed seborrheic keratosis: Secondary | ICD-10-CM | POA: Diagnosis not present

## 2013-01-14 DIAGNOSIS — L905 Scar conditions and fibrosis of skin: Secondary | ICD-10-CM | POA: Diagnosis not present

## 2013-01-14 DIAGNOSIS — L570 Actinic keratosis: Secondary | ICD-10-CM | POA: Diagnosis not present

## 2013-01-14 DIAGNOSIS — L821 Other seborrheic keratosis: Secondary | ICD-10-CM | POA: Diagnosis not present

## 2013-01-14 DIAGNOSIS — D1800 Hemangioma unspecified site: Secondary | ICD-10-CM | POA: Diagnosis not present

## 2013-01-14 DIAGNOSIS — L578 Other skin changes due to chronic exposure to nonionizing radiation: Secondary | ICD-10-CM | POA: Diagnosis not present

## 2013-01-15 ENCOUNTER — Ambulatory Visit (INDEPENDENT_AMBULATORY_CARE_PROVIDER_SITE_OTHER): Payer: Medicare Other | Admitting: Internal Medicine

## 2013-01-15 ENCOUNTER — Encounter: Payer: Self-pay | Admitting: Internal Medicine

## 2013-01-15 ENCOUNTER — Other Ambulatory Visit (INDEPENDENT_AMBULATORY_CARE_PROVIDER_SITE_OTHER): Payer: Medicare Other

## 2013-01-15 ENCOUNTER — Telehealth: Payer: Self-pay | Admitting: Internal Medicine

## 2013-01-15 VITALS — BP 148/78 | HR 76 | Temp 96.8°F | Resp 16 | Ht 68.0 in | Wt 143.0 lb

## 2013-01-15 DIAGNOSIS — R059 Cough, unspecified: Secondary | ICD-10-CM | POA: Diagnosis not present

## 2013-01-15 DIAGNOSIS — I4901 Ventricular fibrillation: Secondary | ICD-10-CM | POA: Diagnosis not present

## 2013-01-15 DIAGNOSIS — N400 Enlarged prostate without lower urinary tract symptoms: Secondary | ICD-10-CM

## 2013-01-15 DIAGNOSIS — I2510 Atherosclerotic heart disease of native coronary artery without angina pectoris: Secondary | ICD-10-CM

## 2013-01-15 DIAGNOSIS — Z0000 Encounter for general adult medical examination without abnormal findings: Secondary | ICD-10-CM

## 2013-01-15 DIAGNOSIS — N320 Bladder-neck obstruction: Secondary | ICD-10-CM

## 2013-01-15 DIAGNOSIS — I469 Cardiac arrest, cause unspecified: Secondary | ICD-10-CM

## 2013-01-15 DIAGNOSIS — E785 Hyperlipidemia, unspecified: Secondary | ICD-10-CM

## 2013-01-15 DIAGNOSIS — I10 Essential (primary) hypertension: Secondary | ICD-10-CM | POA: Diagnosis not present

## 2013-01-15 DIAGNOSIS — R209 Unspecified disturbances of skin sensation: Secondary | ICD-10-CM | POA: Diagnosis not present

## 2013-01-15 DIAGNOSIS — J189 Pneumonia, unspecified organism: Secondary | ICD-10-CM | POA: Diagnosis not present

## 2013-01-15 DIAGNOSIS — R05 Cough: Secondary | ICD-10-CM | POA: Diagnosis not present

## 2013-01-15 DIAGNOSIS — R5381 Other malaise: Secondary | ICD-10-CM

## 2013-01-15 DIAGNOSIS — R202 Paresthesia of skin: Secondary | ICD-10-CM

## 2013-01-15 DIAGNOSIS — K219 Gastro-esophageal reflux disease without esophagitis: Secondary | ICD-10-CM

## 2013-01-15 DIAGNOSIS — Z951 Presence of aortocoronary bypass graft: Secondary | ICD-10-CM

## 2013-01-15 DIAGNOSIS — E559 Vitamin D deficiency, unspecified: Secondary | ICD-10-CM

## 2013-01-15 LAB — CBC WITH DIFFERENTIAL/PLATELET
Basophils Absolute: 0 10*3/uL (ref 0.0–0.1)
Basophils Relative: 0 % (ref 0.0–3.0)
Eosinophils Absolute: 0.2 10*3/uL (ref 0.0–0.7)
Eosinophils Relative: 3 % (ref 0.0–5.0)
HCT: 34.7 % — ABNORMAL LOW (ref 39.0–52.0)
Hemoglobin: 11.6 g/dL — ABNORMAL LOW (ref 13.0–17.0)
Lymphocytes Relative: 32.3 % (ref 12.0–46.0)
Lymphs Abs: 2.5 10*3/uL (ref 0.7–4.0)
MCHC: 33.5 g/dL (ref 30.0–36.0)
MCV: 85 fl (ref 78.0–100.0)
Monocytes Absolute: 0.6 10*3/uL (ref 0.1–1.0)
Monocytes Relative: 7.5 % (ref 3.0–12.0)
Neutro Abs: 4.4 10*3/uL (ref 1.4–7.7)
Neutrophils Relative %: 57.2 % (ref 43.0–77.0)
Platelets: 168 10*3/uL (ref 150.0–400.0)
RBC: 4.08 Mil/uL — ABNORMAL LOW (ref 4.22–5.81)
RDW: 14.9 % — ABNORMAL HIGH (ref 11.5–14.6)
WBC: 7.6 10*3/uL (ref 4.5–10.5)

## 2013-01-15 LAB — URINALYSIS
Bilirubin Urine: NEGATIVE
Hgb urine dipstick: NEGATIVE
Ketones, ur: NEGATIVE
Leukocytes, UA: NEGATIVE
Nitrite: NEGATIVE
Specific Gravity, Urine: 1.02 (ref 1.000–1.030)
Total Protein, Urine: NEGATIVE
Urine Glucose: NEGATIVE
Urobilinogen, UA: 0.2 (ref 0.0–1.0)
pH: 7 (ref 5.0–8.0)

## 2013-01-15 LAB — LIPID PANEL
Cholesterol: 177 mg/dL (ref 0–200)
HDL: 38.3 mg/dL — ABNORMAL LOW (ref >39.00)
LDL Cholesterol: 113 mg/dL — ABNORMAL HIGH (ref 0–99)
Total CHOL/HDL Ratio: 5
Triglycerides: 129 mg/dL (ref 0.0–149.0)
VLDL: 25.8 mg/dL (ref 0.0–40.0)

## 2013-01-15 LAB — IBC PANEL
Iron: 78 ug/dL (ref 42–165)
Saturation Ratios: 23.1 % (ref 20.0–50.0)
Transferrin: 241.3 mg/dL (ref 212.0–360.0)

## 2013-01-15 LAB — BASIC METABOLIC PANEL
BUN: 12 mg/dL (ref 6–23)
CO2: 29 mEq/L (ref 19–32)
Calcium: 10.5 mg/dL (ref 8.4–10.5)
Chloride: 106 mEq/L (ref 96–112)
Creatinine, Ser: 0.9 mg/dL (ref 0.4–1.5)
GFR: 87.81 mL/min (ref >60.00)
Glucose, Bld: 84 mg/dL (ref 70–99)
Potassium: 4.5 mEq/L (ref 3.5–5.1)
Sodium: 139 mEq/L (ref 135–145)

## 2013-01-15 LAB — HEPATIC FUNCTION PANEL
ALT: 11 U/L (ref 0–53)
AST: 20 U/L (ref 0–37)
Albumin: 3.6 g/dL (ref 3.5–5.2)
Alkaline Phosphatase: 76 U/L (ref 39–117)
Bilirubin, Direct: 0.1 mg/dL (ref 0.0–0.3)
Total Bilirubin: 0.7 mg/dL (ref 0.3–1.2)
Total Protein: 7.1 g/dL (ref 6.0–8.3)

## 2013-01-15 LAB — VITAMIN B12: Vitamin B-12: 546 pg/mL (ref 211–911)

## 2013-01-15 LAB — PSA: PSA: 7.17 ng/mL — ABNORMAL HIGH (ref 0.10–4.00)

## 2013-01-15 LAB — TSH: TSH: 1.49 u[IU]/mL (ref 0.35–5.50)

## 2013-01-15 NOTE — Assessment & Plan Note (Signed)
Continue with current prescription therapy as reflected on the Med list.  

## 2013-01-15 NOTE — Assessment & Plan Note (Signed)
F/u w/dr [name] - will sch

## 2013-01-15 NOTE — Progress Notes (Signed)
Subjective:   HPI  C/o spastic cough w/deep breaths - not better. C/o chills, sweats, achy feeling. Breo, doxy did not help... C/o R CP at times, bad  (call Kathie Rhodes - wife - [phone])  Wt Readings from Last 3 Encounters:  01/03/2013 143 lb (64.864 kg)  12/31/12 146 lb 9.6 oz (66.497 kg)  12/16/12 145 lb 3.2 oz (65.862 kg)   BP Readings from Last 3 Encounters:  01/01/2013 148/78  12/31/12 120/72  12/16/12 120/86     Post-hosp f/u -- cardiac arrest on 11/18/2012  [name] is a 79 year old male with  history of coronary artery disease who was admitted on October 31, 2012,  after witnessed arrest at home, presumed VFib. He was started on [place] protocol at admission, and required intubation for acute respiratory  failure. Workup revealed severe hypokinesis of basal, mid, posterior,  anterior, lateral walls. He required treatment with IV antibiotics for  HCAP. He had decreased level of responsiveness. An EEG showed severe  slowing of cerebral activity, compatible with severe encephalopathy on  December 13. He tolerated extubation, and has been upgraded to D3 diet,  thin liquids. He had ICD placed on December 18th, by Dr. Graciela Husbands. Device  function normal. Therapies were initiated and patient is noted to be  deconditioned, limited by hypoxia, as well as noted to have issues with  delayed processing.     The patient presents for a follow-up of  chronic BPH, hypertension, chronic dyslipidemia,CAD controlled with medicines       Review of Systems  Constitutional: Negative for appetite change, fatigue and unexpected weight change.  HENT: Negative for nosebleeds, congestion, sore throat, sneezing, trouble swallowing and neck pain.   Eyes: Negative for itching and visual disturbance.  Respiratory: Negative for cough.   Cardiovascular: Negative for chest pain, palpitations and leg swelling.  Gastrointestinal: Negative for nausea, diarrhea, blood in stool and abdominal  distention.  Genitourinary: Negative for frequency and hematuria.  Musculoskeletal: Negative for back pain, joint swelling and gait problem.  Skin: Negative for rash.  Neurological: Negative for dizziness, tremors, speech difficulty and weakness.  Psychiatric/Behavioral: Negative for sleep disturbance, dysphoric mood and agitation. The patient is not nervous/anxious.   coughing      Objective:   Physical Exam  Constitutional: He is oriented to person, place, and time. He appears well-developed and well-nourished. No distress.  HENT:  Head: Normocephalic and atraumatic.  Right Ear: External ear normal.  Left Ear: External ear normal.  Nose: Nose normal.  Mouth/Throat: Oropharynx is clear and moist. No oropharyngeal exudate.  Eyes: Conjunctivae and EOM are normal. Pupils are equal, round, and reactive to light. Right eye exhibits no discharge. Left eye exhibits no discharge. No scleral icterus.  Neck: Normal range of motion. Neck supple. No JVD present. No tracheal deviation present. No thyromegaly present.  Cardiovascular: Normal rate, regular rhythm, normal heart sounds and intact distal pulses.  Exam reveals no gallop and no friction rub.   No murmur heard. Pulmonary/Chest: Effort normal and breath sounds normal. No stridor. No respiratory distress. He has no wheezes. He has no rales. He exhibits no tenderness.  Abdominal: Soft. Bowel sounds are normal. He exhibits no distension and no mass. There is no tenderness. There is no rebound and no guarding.  Genitourinary: Rectum normal, prostate normal and penis normal. Guaiac negative stool. No penile tenderness.  Musculoskeletal: Normal range of motion. He exhibits no edema and no tenderness.  Lymphadenopathy:    He has no  cervical adenopathy.  Neurological: He is alert and oriented to person, place, and time. He has normal reflexes. No cranial nerve deficit. He exhibits normal muscle tone. Coordination normal.  Skin: Skin is warm and dry.  No rash noted. He is not diaphoretic. No erythema. No pallor.  Psychiatric: He has a normal mood and affect. His behavior is normal. Judgment and thought content normal.    Lab Results  Component Value Date   WBC 8.0 12/31/2012   HGB 11.1* 12/31/2012   HCT 33.1* 12/31/2012   PLT 184.0 12/31/2012   GLUCOSE 101* 12/14/2012   CHOL 141 07/20/2012   TRIG 55.0 07/20/2012   HDL 43.60 07/20/2012   LDLCALC 86 07/20/2012   ALT 24 11/13/2012   AST 36 11/13/2012   NA 132* 12/14/2012   K 5.0 12/14/2012   CL 97 12/14/2012   CREATININE 1.0 12/14/2012   BUN 13 12/14/2012   CO2 30 12/14/2012   TSH 1.360 Nov 30, 2012   PSA 8.26* 12/18/2011   INR 1.32 11/22/2012   HGBA1C 6.3* 11-30-12       Assessment & Plan:

## 2013-01-15 NOTE — Telephone Encounter (Signed)
[name] Christensen, please, inform patient that all labs are normal except for elev PSA (keep ROV w/Urol) .  Please, mail the labs to the patient.  Iron is nl - ok to stop  Thx

## 2013-01-15 NOTE — Assessment & Plan Note (Signed)
Much better 

## 2013-01-15 NOTE — Assessment & Plan Note (Signed)
Better Wt Readings from Last 3 Encounters:  01/12/2013 143 lb (64.864 kg)  12/31/12 146 lb 9.6 oz (66.497 kg)  12/16/12 145 lb 3.2 oz (65.862 kg)

## 2013-01-16 LAB — VITAMIN D 25 HYDROXY (VIT D DEFICIENCY, FRACTURES): Vit D, 25-Hydroxy: 45 ng/mL (ref 30–89)

## 2013-01-17 NOTE — Assessment & Plan Note (Signed)
Continue with current prescription therapy as reflected on the Med list.  

## 2013-01-18 ENCOUNTER — Encounter (HOSPITAL_COMMUNITY)
Admission: RE | Admit: 2013-01-18 | Discharge: 2013-01-18 | Disposition: A | Discharge status: Home / Self Care | Payer: Self-pay | Source: Ambulatory Visit | Attending: Internal Medicine | Admitting: Internal Medicine

## 2013-01-18 NOTE — Telephone Encounter (Signed)
Pt informed/ copies mailed.  

## 2013-01-20 ENCOUNTER — Encounter (HOSPITAL_COMMUNITY)
Admission: RE | Admit: 2013-01-20 | Discharge: 2013-01-20 | Disposition: A | Discharge status: Home / Self Care | Payer: Self-pay | Source: Ambulatory Visit | Attending: Internal Medicine | Admitting: Internal Medicine

## 2013-01-22 ENCOUNTER — Encounter (HOSPITAL_COMMUNITY)
Admission: RE | Admit: 2013-01-22 | Discharge: 2013-01-22 | Disposition: A | Discharge status: Home / Self Care | Payer: Self-pay | Source: Ambulatory Visit | Attending: Internal Medicine | Admitting: Internal Medicine

## 2013-01-23 DIAGNOSIS — 419620001 Death: Secondary | SNOMED CT | POA: Diagnosis not present

## 2013-01-23 DEATH — deceased

## 2013-01-25 ENCOUNTER — Encounter (HOSPITAL_COMMUNITY)
Admission: RE | Admit: 2013-01-25 | Discharge: 2013-01-25 | Disposition: A | Discharge status: Home / Self Care | Payer: Self-pay | Source: Ambulatory Visit | Attending: Internal Medicine | Admitting: Internal Medicine

## 2013-01-25 ENCOUNTER — Encounter: Payer: Medicare Other | Admitting: Physician Assistant

## 2013-01-25 DIAGNOSIS — Z5189 Encounter for other specified aftercare: Secondary | ICD-10-CM | POA: Insufficient documentation

## 2013-01-25 DIAGNOSIS — I2510 Atherosclerotic heart disease of native coronary artery without angina pectoris: Secondary | ICD-10-CM | POA: Insufficient documentation

## 2013-01-25 DIAGNOSIS — Z95810 Presence of automatic (implantable) cardiac defibrillator: Secondary | ICD-10-CM | POA: Insufficient documentation

## 2013-01-25 DIAGNOSIS — I10 Essential (primary) hypertension: Secondary | ICD-10-CM | POA: Insufficient documentation

## 2013-01-27 ENCOUNTER — Encounter (HOSPITAL_COMMUNITY)
Admission: RE | Admit: 2013-01-27 | Discharge: 2013-01-27 | Disposition: A | Discharge status: Home / Self Care | Payer: Self-pay | Source: Ambulatory Visit | Attending: Internal Medicine | Admitting: Internal Medicine

## 2013-01-27 ENCOUNTER — Telehealth: Payer: Self-pay | Admitting: *Deleted

## 2013-01-27 NOTE — Telephone Encounter (Signed)
Rec fax stating Atenolol 50 mg 1 po qd # 90 was  changed to 2 qd. Is this correct?

## 2013-01-28 ENCOUNTER — Ambulatory Visit (INDEPENDENT_AMBULATORY_CARE_PROVIDER_SITE_OTHER): Payer: Medicare Other | Admitting: Internal Medicine

## 2013-01-28 ENCOUNTER — Telehealth: Payer: Self-pay | Admitting: *Deleted

## 2013-01-28 ENCOUNTER — Encounter: Payer: Self-pay | Admitting: Internal Medicine

## 2013-01-28 ENCOUNTER — Ambulatory Visit (INDEPENDENT_AMBULATORY_CARE_PROVIDER_SITE_OTHER)
Admission: RE | Admit: 2013-01-28 | Discharge: 2013-01-28 | Disposition: A | Discharge status: Home / Self Care | Payer: Medicare Other | Source: Ambulatory Visit | Attending: Internal Medicine | Admitting: Internal Medicine

## 2013-01-28 VITALS — BP 130/80 | HR 65 | Temp 97.3°F | Ht 68.0 in | Wt 148.0 lb

## 2013-01-28 DIAGNOSIS — R9389 Abnormal findings on diagnostic imaging of other specified body structures: Secondary | ICD-10-CM

## 2013-01-28 DIAGNOSIS — R091 Pleurisy: Secondary | ICD-10-CM | POA: Diagnosis not present

## 2013-01-28 DIAGNOSIS — R918 Other nonspecific abnormal finding of lung field: Secondary | ICD-10-CM | POA: Diagnosis not present

## 2013-01-28 DIAGNOSIS — J189 Pneumonia, unspecified organism: Secondary | ICD-10-CM

## 2013-01-28 MED ORDER — ATENOLOL 50 MG PO TABS: 50.0000 mg | ORAL_TABLET | Freq: Two times a day (BID) | ORAL | Status: DC

## 2013-01-28 NOTE — Telephone Encounter (Signed)
     Rec fax stating Atenolol 50 mg 1 po qd # 90 was changed to 2 qd. Is this correct?

## 2013-01-28 NOTE — Telephone Encounter (Signed)
Ok per Dr. Posey Rea- new Rx sent.

## 2013-01-28 NOTE — Telephone Encounter (Signed)
E have not seen pt since hospital in [facility]  He is scheduled for followup on 3/25  I would ask that he refill what he has and we will make adjustments at that visit thnks steve

## 2013-01-28 NOTE — Progress Notes (Signed)
Subjective:    Patient ID: [name] Christensen, male    DOB: May 21, 1932  MRN: [id number]  HPI  33 yowm never smoker s/p cardiac arrest Oct 31 2012 requiring artic sun and ever since Dec 17 placement of debrillator every deep makes him cough> eval with ? pna 1/20  > rx levaquin some better  referred 12/16/2012 to pulmonary clinic by Dr Posey Rea.   12/16/2012 1st pulmonary eval cc intermittent chills, severe cough since Dec 17 when takes breath in started levquin 1/20with ? pna and starting to feel better, sob only with exertion, no excess mucus production. rec Take zantac (ranitidine) 150 mg after bfast and after supper whether you think you need it or not for now GERD  For cough use tramadol 50 mg one every 4 hours if needed    12/31/2012 f/u ov/Teila Skalsky cc overall much better, min grey mucus mostly after stirring, some chills, finished levaquin 2/2  rec  Don't stop the zantac until 100% better for at least a week without the need for any cough medication  01/28/2013 f/u ov/Akeira Lahm cc completely back to baseline, no cough or sob, not taking any cough meds for a week, still on the zantac at hs.  No obvious daytime variabilty or assoc  Sob or cp or chest tightness, subjective wheeze overt sinus or hb symptoms. No unusual exp hx or h/o childhood pna/ asthma or premature birth to his knowledge.   Sleeping ok without nocturnal  or early am exacerbation  of respiratory  c/o's or need for noct saba. Also denies any obvious fluctuation of symptoms with weather or environmental changes or other aggravating or alleviating factors except as outlined above   ROS  The following are not active complaints unless bolded sore throat, dysphagia, dental problems, itching, sneezing,  nasal congestion or excess/ purulent secretions, ear ache,   fever, chills, sweats, unintended wt loss, pleuritic or exertional cp, hemoptysis,  orthopnea pnd or leg swelling, presyncope, palpitations, heartburn, abdominal pain, anorexia, nausea,  vomiting, diarrhea  or change in bowel or urinary habits, change in stools or urine, dysuria,hematuria,  rash, arthralgias, visual complaints, headache, numbness weakness or ataxia or problems with walking or coordination,  change in mood/affect or memory.               Objective:   Physical Exam  Wt 146 12/31/2012  > 01/28/2013 148  Wt Readings from Last 3 Encounters:  12/16/12 145 lb 3.2 oz (65.862 kg)  12/14/12 145 lb (65.772 kg)  11/25/2012 144 lb (65.318 kg)    HEENT: nl dentition, turbinates, and orophanx. Nl external ear canals without cough reflex   NECK :  without JVD/Nodes/TM/ nl carotid upstrokes bilaterally   LUNGS: no acc muscle use, clear to A and P bilaterally without cough on insp or exp maneuvers   CV:  RRR  no s3 or murmur or increase in P2, no edema   ABD:  soft and nontender with nl excursion in the supine position. No bruits or organomegaly, bowel sounds nl  MS:  warm without deformities, calf tenderness, cyanosis or clubbing  SKIN: warm and dry without lesions     CXR  01/28/2013 :  only chronic changes bilaterally int and pleural  - no airspace dz/effusions        Assessment & Plan:

## 2013-01-28 NOTE — Assessment & Plan Note (Signed)
rx levaquin 12/14/12 - esr 51 12/31/2012 and no eos  He has no symptoms at all and only subtle interstitial and pleural changes bilaterally which are chronic and probably aren't related to his present illness - it does make it difficult to read his xrays for acute events and apples to apples comparison will always be needed so as not to over call pna here  In addition, Explained natural history of uri and why it's necessary in patients at risk to treat GERD aggressively  at least  short term   to reduce risk of evolving cyclical cough initially  triggered by epithelial injury and a heightened sensitivty to the effects of any upper airway irritants,  most importantly acid - related.  That is, the more sensitive the epithelium damaged for virus, the more the cough, the more the secondary reflux (especially in those prone to reflux) the more the irritation of the sensitive mucosa and so on in a cyclical pattern.   For now ok to d/c the hs zantac and f/u here prn

## 2013-01-28 NOTE — Patient Instructions (Addendum)
Don't stop the zantac until 100% better for at least a week without the need for any cough medication.   If you are satisfied with your treatment plan let your doctor know and he/she can either refill your medications or you can return here when your prescription runs out.     If in any way you are not 100% satisfied,  please tell us.  If 100% better, tell your friends!

## 2013-01-29 ENCOUNTER — Encounter (HOSPITAL_COMMUNITY): Payer: Self-pay

## 2013-02-01 ENCOUNTER — Encounter (HOSPITAL_COMMUNITY)
Admission: RE | Admit: 2013-02-01 | Discharge: 2013-02-01 | Disposition: A | Discharge status: Home / Self Care | Payer: Self-pay | Source: Ambulatory Visit | Attending: Internal Medicine | Admitting: Internal Medicine

## 2013-02-01 NOTE — Telephone Encounter (Signed)
Per Dr. Posey Rea 2 qd is correct. Pharmacy informed of this on 01-28-13.

## 2013-02-03 ENCOUNTER — Encounter (HOSPITAL_COMMUNITY)
Admission: RE | Admit: 2013-02-03 | Discharge: 2013-02-03 | Disposition: A | Discharge status: Home / Self Care | Payer: Self-pay | Source: Ambulatory Visit | Attending: Internal Medicine | Admitting: Internal Medicine

## 2013-02-05 ENCOUNTER — Encounter (HOSPITAL_COMMUNITY)
Admission: RE | Admit: 2013-02-05 | Discharge: 2013-02-05 | Disposition: A | Discharge status: Home / Self Care | Payer: Self-pay | Source: Ambulatory Visit | Attending: Internal Medicine | Admitting: Internal Medicine

## 2013-02-08 ENCOUNTER — Encounter (HOSPITAL_COMMUNITY)
Admission: RE | Admit: 2013-02-08 | Discharge: 2013-02-08 | Disposition: A | Discharge status: Home / Self Care | Payer: Self-pay | Source: Ambulatory Visit | Attending: Internal Medicine | Admitting: Internal Medicine

## 2013-02-10 ENCOUNTER — Encounter (HOSPITAL_COMMUNITY)
Admission: RE | Admit: 2013-02-10 | Discharge: 2013-02-10 | Disposition: A | Discharge status: Home / Self Care | Payer: Self-pay | Source: Ambulatory Visit | Attending: Internal Medicine | Admitting: Internal Medicine

## 2013-02-11 ENCOUNTER — Encounter (HOSPITAL_COMMUNITY)
Admission: RE | Admit: 2013-02-11 | Discharge: 2013-02-11 | Disposition: A | Discharge status: Home / Self Care | Payer: Self-pay | Source: Ambulatory Visit | Attending: Internal Medicine | Admitting: Internal Medicine

## 2013-02-12 ENCOUNTER — Encounter (HOSPITAL_COMMUNITY): Payer: Self-pay

## 2013-02-15 ENCOUNTER — Encounter (HOSPITAL_COMMUNITY)
Admission: RE | Admit: 2013-02-15 | Discharge: 2013-02-15 | Disposition: A | Discharge status: Home / Self Care | Payer: Self-pay | Source: Ambulatory Visit | Attending: Internal Medicine | Admitting: Internal Medicine

## 2013-02-16 ENCOUNTER — Telehealth: Payer: Self-pay | Admitting: *Deleted

## 2013-02-16 ENCOUNTER — Ambulatory Visit (INDEPENDENT_AMBULATORY_CARE_PROVIDER_SITE_OTHER): Payer: Medicare Other | Admitting: Internal Medicine

## 2013-02-16 ENCOUNTER — Encounter: Payer: Self-pay | Admitting: Internal Medicine

## 2013-02-16 ENCOUNTER — Encounter (INDEPENDENT_AMBULATORY_CARE_PROVIDER_SITE_OTHER): Payer: Medicare Other

## 2013-02-16 VITALS — BP 127/65 | HR 65 | Ht 68.0 in | Wt 151.0 lb

## 2013-02-16 DIAGNOSIS — R002 Palpitations: Secondary | ICD-10-CM

## 2013-02-16 DIAGNOSIS — I469 Cardiac arrest, cause unspecified: Secondary | ICD-10-CM

## 2013-02-16 DIAGNOSIS — I4901 Ventricular fibrillation: Secondary | ICD-10-CM

## 2013-02-16 DIAGNOSIS — R0989 Other specified symptoms and signs involving the circulatory and respiratory systems: Secondary | ICD-10-CM

## 2013-02-16 DIAGNOSIS — I2510 Atherosclerotic heart disease of native coronary artery without angina pectoris: Secondary | ICD-10-CM | POA: Diagnosis not present

## 2013-02-16 LAB — ICD DEVICE OBSERVATION
BRDY-0002RV: 40 {beats}/min
CHARGE TIME: 7.1 s
DEV-0020ICD: NEGATIVE
DEVICE MODEL ICD: 7048849
FVT: 0
HV IMPEDENCE: 64 Ohm
PACEART VT: 0
RV LEAD AMPLITUDE: 10.3 mv
RV LEAD IMPEDENCE ICD: 387.5 Ohm
RV LEAD THRESHOLD: 1 V
TOT-0007: 0
TOT-0008: 0
TOT-0009: 0
TOT-0010: 0
TZAT-0001SLOWVT: 1
TZAT-0004SLOWVT: 8
TZAT-0012SLOWVT: 200 ms
TZAT-0013SLOWVT: 3
TZAT-0018SLOWVT: NEGATIVE
TZAT-0019SLOWVT: 7.5 V
TZAT-0020SLOWVT: 1 ms
TZON-0003SLOWVT: 300 ms
TZON-0004SLOWVT: 30
TZON-0005SLOWVT: 6
TZON-0010SLOWVT: 40 ms
TZST-0001SLOWVT: 2
TZST-0001SLOWVT: 3
TZST-0001SLOWVT: 4
TZST-0001SLOWVT: 5
TZST-0003SLOWVT: 30 J
TZST-0003SLOWVT: 36 J
TZST-0003SLOWVT: 36 J
TZST-0003SLOWVT: 36 J
VENTRICULAR PACING ICD: 0 pct
VF: 0

## 2013-02-16 NOTE — Assessment & Plan Note (Addendum)
I suspect these represent PVCs. We will get a Holter monitor to make sure there is no nonsustained ventricular tachycardia

## 2013-02-16 NOTE — Progress Notes (Signed)
Patient Care Team: Duke Salvia, MD as PCP - General (Cardiology) Wendall Stade, MD as Attending Physician (Cardiology)   HPI  [name] Christensen is a 79 y.o. male Seen in followup for aborted cardiac arrest occurring in the setting of ischemic heart disease.  He has a history of ischemic heart disease with prior bypass grafting. Catheterization 12/13 demonstrating patent grafts with an atretic LIMA. Left ventricular function was normal  He has recovered well. He has some discomfort in his defibrillator site which makes it difficult to sleep in the left side. He denies chest pain or shortness of breath.  He has had some episodes where he feels a fleeting discomfort in his neck as if he can't breathe.  Past Medical History  Diagnosis Date  . Coronary artery disease     s/p coronary bypass grafting 2005  . GERD (gastroesophageal reflux disease)   . Hyperlipidemia   . Hypertension   . BPH (benign prostatic hypertrophy)     elevated PSA Dr. Lindell Noe Bx 2010  . Heart attack 2005    Past Surgical History  Procedure Laterality Date  . Cardiac catheterization  2007    with patent graft anatomy atretic left internal mammary  artery to the LAD which is nonobstructive. Will restart study June 08, 2007  . Coronary artery bypass graft  2005  . Lumbar fusion  09/2007  . Cardiac defibrillator placement  dec 2013    Dr. Graciela Husbands  . Appendectomy      Current Outpatient Prescriptions  Medication Sig Dispense Refill  . acetaminophen (TYLENOL) 325 MG tablet Take 325-650 mg by mouth every 4 (four) hours as needed. For pain      . alfuzosin (UROXATRAL) 10 MG 24 hr tablet Take 10 mg by mouth daily.      [name] aspirin 81 MG chewable tablet Chew 1 tablet (81 mg total) by mouth daily.      [name] atenolol (TENORMIN) 50 MG tablet Take 1 tablet (50 mg total) by mouth 2 (two) times daily.  180 tablet  3  . atorvastatin (LIPITOR) 40 MG tablet Take 1 tablet (40 mg total) by mouth every other day.  90 tablet  3  .  Cholecalciferol (VITAMIN D3) 1000 UNITS CAPS Take 1 capsule by mouth daily.        . clopidogrel (PLAVIX) 75 MG tablet Take 1 tablet (75 mg total) by mouth daily.  30 tablet  5  . diazepam (VALIUM) 5 MG tablet Take 2.5-5 mg by mouth every 6 (six) hours as needed. For anxiety      . Docusate Sodium (STOOL SOFTENER) 100 MG capsule Take 100 mg by mouth daily as needed. For constipation      . guaiFENesin (MUCINEX) 600 MG 12 hr tablet Take 2 tablets (1,200 mg total) by mouth 2 (two) times daily.  30 tablet  1  . Multiple Vitamin (MULTIVITAMIN WITH MINERALS) TABS Take 1 tablet by mouth daily.      . nitroGLYCERIN (NITROSTAT) 0.4 MG SL tablet Place 0.4 mg under the tongue every 5 (five) minutes as needed. For chest pain      . ranitidine (ZANTAC) 150 MG tablet One after bfast and one after supper      . traMADol (ULTRAM) 50 MG tablet Take 1-2 tablets (50-100 mg total) by mouth 2 (two) times daily as needed for pain (for cough).  60 tablet  3   No current facility-administered medications for this visit.    Allergies  Allergen Reactions  .  Ezetimibe     REACTION: numbness  . Niacin     REACTION: CP  . Penicillins   . Pravastatin Sodium   . Rosuvastatin     REACTION: aches    Review of Systems negative except from HPI and PMH  Physical Exam BP 127/65  Pulse 65  Ht 5\' 8"  (1.727 m)  Wt 151 lb (68.493 kg)  BMI 22.96 kg/m2 Well developed and well nourished in no acute distress HENT normal E scleral and icterus clear Neck Suppl. Device pocket well healed; without hematoma or erythema JVP flat; carotids brisk and full Clear to ausculation   Regular rate and rhythm, no murmurs gallops or rub Soft with active bowel sounds No clubbing cyanosis none Edema Alert and oriented, grossly normal motor and sensory function Skin Warm and Dry    Assessment and  Plan

## 2013-02-16 NOTE — Telephone Encounter (Signed)
48 hr holter monitor placed on Pt 02/14/2013 TK 

## 2013-02-16 NOTE — Assessment & Plan Note (Signed)
stable °

## 2013-02-16 NOTE — Assessment & Plan Note (Addendum)
No recurrent ventricular arrhythmia. I have reminded him of driving restrictions that last for 6 months.

## 2013-02-16 NOTE — Assessment & Plan Note (Signed)
The patient's device was interrogated and the information was fully reviewed.  The device was reprogrammed to  Maximize longevity 

## 2013-02-16 NOTE — Patient Instructions (Signed)
Your physician has recommended that you wear a 48 hour holter monitor (place today). Holter monitors are medical devices that record the heart's electrical activity. Doctors most often use these monitors to diagnose arrhythmias. Arrhythmias are problems with the speed or rhythm of the heartbeat. The monitor is a small, portable device. You can wear one while you do your normal daily activities. This is usually used to diagnose what is causing palpitations/syncope (passing out).  Remote monitoring is used to monitor your Pacemaker of ICD from home. This monitoring reduces the number of office visits required to check your device to one time per year. It allows [place] to keep an eye on the functioning of your device to ensure it is working properly. You are scheduled for a device check from home on 05/24/13. You may send your transmission at any time that day. If you have a wireless device, the transmission will be sent automatically. After your physician reviews your transmission, you will receive a postcard with your next transmission date.  Your physician wants you to follow-up in: December with Dr. Graciela Husbands. You will receive a reminder letter in the mail two months in advance. If you don't receive a letter, please call our office to schedule the follow-up appointment.

## 2013-02-17 ENCOUNTER — Encounter (HOSPITAL_COMMUNITY)
Admission: RE | Admit: 2013-02-17 | Discharge: 2013-02-17 | Disposition: A | Discharge status: Home / Self Care | Payer: Self-pay | Source: Ambulatory Visit | Attending: Internal Medicine | Admitting: Internal Medicine

## 2013-02-18 ENCOUNTER — Encounter (HOSPITAL_COMMUNITY)
Admission: RE | Admit: 2013-02-18 | Discharge: 2013-02-18 | Disposition: A | Discharge status: Home / Self Care | Payer: Self-pay | Source: Ambulatory Visit | Attending: Internal Medicine | Admitting: Internal Medicine

## 2013-02-18 ENCOUNTER — Ambulatory Visit (INDEPENDENT_AMBULATORY_CARE_PROVIDER_SITE_OTHER): Payer: Medicare Other | Admitting: Cardiovascular Disease

## 2013-02-18 ENCOUNTER — Telehealth: Payer: Self-pay | Admitting: *Deleted

## 2013-02-18 ENCOUNTER — Encounter: Payer: Self-pay | Admitting: Cardiovascular Disease

## 2013-02-18 VITALS — BP 139/68 | HR 75 | Ht 68.0 in | Wt 151.0 lb

## 2013-02-18 DIAGNOSIS — Z95810 Presence of automatic (implantable) cardiac defibrillator: Secondary | ICD-10-CM

## 2013-02-18 DIAGNOSIS — E785 Hyperlipidemia, unspecified: Secondary | ICD-10-CM | POA: Diagnosis not present

## 2013-02-18 DIAGNOSIS — R002 Palpitations: Secondary | ICD-10-CM

## 2013-02-18 DIAGNOSIS — I2510 Atherosclerotic heart disease of native coronary artery without angina pectoris: Secondary | ICD-10-CM | POA: Diagnosis not present

## 2013-02-18 DIAGNOSIS — I10 Essential (primary) hypertension: Secondary | ICD-10-CM

## 2013-02-18 DIAGNOSIS — I469 Cardiac arrest, cause unspecified: Secondary | ICD-10-CM

## 2013-02-18 MED ORDER — MAGNESIUM OXIDE 400 MG PO TABS: 400.0000 mg | ORAL_TABLET | Freq: Every day | ORAL | Status: DC

## 2013-02-18 NOTE — Addendum Note (Signed)
Addended by: Scherrie Bateman E on: 01/27/2013 02:20 PM   Modules accepted: Orders

## 2013-02-18 NOTE — Assessment & Plan Note (Signed)
Cholesterol is at goal.  Continue current dose of statin and diet Rx.  No myalgias or side effects.  F/U  LFT's in 6 months. Lab Results  Component Value Date   LDLCALC 113* 01/19/2013

## 2013-02-18 NOTE — Assessment & Plan Note (Signed)
No D/C incision looks great.  F/U SK and pacer clinic

## 2013-02-18 NOTE — Assessment & Plan Note (Signed)
EF 40-45% normal by LV gram grafts patent with atretic lima and patent native LAD  AICD working normally.  PVC;s and bigemminy correlate with sensation of palpitations and catching his breath Discussed with Dr Graciela Husbands Continue beta blocker and start MagOxide 400/day

## 2013-02-18 NOTE — Assessment & Plan Note (Signed)
Well controlled.  Continue current medications and low sodium Dash type diet.    

## 2013-02-18 NOTE — Telephone Encounter (Signed)
LEFT VOICE MAIL  FOR PT TO CALL  BACK PER DR NISHAN  PT NEEDS TO START MAG OX 400 MG EVERY DAY   PER DISCUSSION WITH DR Graciela Husbands .Zack Seal

## 2013-02-18 NOTE — Patient Instructions (Signed)
Your physician wants you to follow-up in:   6  MONTHS WITH DR [name] AND DEC  WITH DR Logan Bores will receive a reminder letter in the mail two months in advance. If you don't receive a letter, please call our office to schedule the follow-up appointment. Your physician recommends that you continue on your current medications as directed. Please refer to the Current Medication list given to you today.

## 2013-02-18 NOTE — Progress Notes (Signed)
Patient ID: JACQUELYN VILLELLA, male   DOB: [birth year], 79 y.o.   MRN: [id number] The patient is a very pleasant 79 year old male previously seen by Dr Degent History of coronary artery disease, status post coronary bypass grafting 2005. The patient is a chronic right bundle branch block. 2 years ago he had a stress echocardiogram with baseline ejection fraction 55% with inferoseptal inferobasal hypokinesis.  Admitted 12/13 with out of hospital arrest arctic sun.  Eventually got AICD by Dr Graciela Husbands. Interestingly his EF was 4-=45%  and grafts patent on cath  Earlier this week saw SK and complained of palpitations that made him catch his breath.  Review of his monitor shows bigeminny and PVC;s  He is already on a beta blocker.    Final Conclusions:  10/28/2012 1. Severe three-vessel obstructive coronary disease.  2. All vein grafts are widely patent including saphenous vein graft to the diagonal, saphenous vein graft sequentially to the first and second obtuse marginal vessels, and saphenous vein graft to the RCA.  3. The atretic LIMA graft to the LAD. However the native LAD has no significant obstructive disease. This is unchanged from prior cardiac catheterization 2007.  4. Normal left ventricular function.  Carotid Dopplers were done by his primary care physician and showed 0-39% bilateral ICA stenosis with a recommendation to followup in 2 years  ROS: Denies fever, malais, weight loss, blurry vision, decreased visual acuity, cough, sputum, SOB, hemoptysis, pleuritic pain, , heartburn, abdominal pain, melena, lower extremity edema, claudication, or rash.  All other systems reviewed and negative  General: Affect appropriate Healthy:  appears stated age HEENT: normal Neck supple with no adenopathy JVP normal no bruits no thyromegaly Lungs clear with no wheezing and good diaphragmatic motion Heart:  S1/S2 no murmur, no rub, gallop or click PMI normal Abdomen: benighn, BS positve, no tenderness, no AAA no  bruit.  No HSM or HJR Distal pulses intact with no bruits No edema Neuro non-focal Skin warm and dry No muscular weakness   Current Outpatient Prescriptions  Medication Sig Dispense Refill  . acetaminophen (TYLENOL) 325 MG tablet Take 325-650 mg by mouth every 4 (four) hours as needed. For pain      . alfuzosin (UROXATRAL) 10 MG 24 hr tablet Take 10 mg by mouth daily.      [name] aspirin 81 MG chewable tablet Chew 1 tablet (81 mg total) by mouth daily.      [name] atenolol (TENORMIN) 50 MG tablet Take 1 tablet (50 mg total) by mouth 2 (two) times daily.  180 tablet  3  . atorvastatin (LIPITOR) 40 MG tablet Take 1 tablet (40 mg total) by mouth every other day.  90 tablet  3  . Cholecalciferol (VITAMIN D3) 1000 UNITS CAPS Take 1 capsule by mouth daily.        . clopidogrel (PLAVIX) 75 MG tablet Take 1 tablet (75 mg total) by mouth daily.  30 tablet  5  . Multiple Vitamin (MULTIVITAMIN WITH MINERALS) TABS Take 1 tablet by mouth daily.      . nitroGLYCERIN (NITROSTAT) 0.4 MG SL tablet Place 0.4 mg under the tongue every 5 (five) minutes as needed. For chest pain      . ranitidine (ZANTAC) 150 MG tablet One after bfast and one after supper      . traMADol (ULTRAM) 50 MG tablet Take 1-2 tablets (50-100 mg total) by mouth 2 (two) times daily as needed for pain (for cough).  60 tablet  3   No current  facility-administered medications for this visit.    Allergies  Ezetimibe; Niacin; Penicillins; Pravastatin sodium; and Rosuvastatin  Electrocardiogram:  SR RBBB 12/12/2012    Assessment and Plan

## 2013-02-18 NOTE — Assessment & Plan Note (Signed)
Stable with no angina and good activity level.  Continue medical Rx  

## 2013-02-19 ENCOUNTER — Encounter (HOSPITAL_COMMUNITY): Payer: Self-pay

## 2013-02-19 NOTE — Telephone Encounter (Signed)
PT AWARE  TO START  400 MG OF MAG OX .Zack Seal

## 2013-02-22 ENCOUNTER — Encounter (HOSPITAL_COMMUNITY)
Admission: RE | Admit: 2013-02-22 | Discharge: 2013-02-22 | Disposition: A | Discharge status: Home / Self Care | Payer: Self-pay | Source: Ambulatory Visit | Attending: Internal Medicine | Admitting: Internal Medicine

## 2013-02-22 NOTE — Progress Notes (Signed)
Pt c/o feeling lightheaded at the end of his 3rd station, treadmill. Heart rate on the pulse oximeter was 38, but when put on the the Zoll monitor, pulse was actually 82 bpm with frequent PVCs/ bigeminy. Pt informed me prior to exercise that he had worn a Holter monitor last week, and was started on magnesium oxide because of PVCs that were noted on the monitor. Pt was asymptomatic after rest and at check out HR was 80 bpm, BP 112/68. Today was patient's last day of exercise in the Cardiac maintenance program, and pt will continue exercise at a fitness center.

## 2013-02-23 DIAGNOSIS — 419620001 Death: Secondary | SNOMED CT | POA: Diagnosis not present

## 2013-02-23 DEATH — deceased

## 2013-02-24 ENCOUNTER — Encounter (HOSPITAL_COMMUNITY): Payer: Medicare Other

## 2013-02-25 DIAGNOSIS — H40019 Open angle with borderline findings, low risk, unspecified eye: Secondary | ICD-10-CM | POA: Diagnosis not present

## 2013-02-26 ENCOUNTER — Encounter (HOSPITAL_COMMUNITY): Payer: Medicare Other

## 2013-03-01 ENCOUNTER — Encounter (HOSPITAL_COMMUNITY): Payer: Medicare Other

## 2013-03-03 ENCOUNTER — Encounter (HOSPITAL_COMMUNITY): Payer: Medicare Other

## 2013-03-05 ENCOUNTER — Encounter (HOSPITAL_COMMUNITY): Payer: Medicare Other

## 2013-03-08 ENCOUNTER — Encounter (HOSPITAL_COMMUNITY): Payer: Medicare Other

## 2013-03-10 ENCOUNTER — Encounter (HOSPITAL_COMMUNITY): Payer: Medicare Other

## 2013-03-12 ENCOUNTER — Encounter (HOSPITAL_COMMUNITY): Payer: Medicare Other

## 2013-03-15 ENCOUNTER — Encounter (HOSPITAL_COMMUNITY): Payer: Medicare Other

## 2013-03-16 ENCOUNTER — Encounter: Payer: Self-pay | Admitting: Internal Medicine

## 2013-03-17 ENCOUNTER — Encounter (HOSPITAL_COMMUNITY): Payer: Medicare Other

## 2013-03-19 ENCOUNTER — Encounter (HOSPITAL_COMMUNITY): Payer: Medicare Other

## 2013-03-22 ENCOUNTER — Encounter (HOSPITAL_COMMUNITY): Payer: Medicare Other

## 2013-03-24 ENCOUNTER — Encounter (HOSPITAL_COMMUNITY): Payer: Medicare Other

## 2013-03-26 ENCOUNTER — Encounter (HOSPITAL_COMMUNITY): Payer: Medicare Other

## 2013-03-29 ENCOUNTER — Encounter (HOSPITAL_COMMUNITY): Payer: Medicare Other

## 2013-03-31 ENCOUNTER — Encounter (HOSPITAL_COMMUNITY): Payer: Medicare Other

## 2013-04-02 ENCOUNTER — Encounter (HOSPITAL_COMMUNITY): Payer: Medicare Other

## 2013-04-05 ENCOUNTER — Encounter (HOSPITAL_COMMUNITY): Payer: Medicare Other

## 2013-04-06 ENCOUNTER — Observation Stay (HOSPITAL_COMMUNITY)
Admission: EM | Admit: 2013-04-06 | Discharge: 2013-04-07 | Disposition: A | Discharge status: Home / Self Care | Payer: Medicare Other | Attending: Cardiology | Admitting: Cardiology

## 2013-04-06 ENCOUNTER — Emergency Department (HOSPITAL_COMMUNITY): Payer: Medicare Other

## 2013-04-06 ENCOUNTER — Encounter (HOSPITAL_COMMUNITY): Payer: Self-pay | Admitting: Emergency Medicine

## 2013-04-06 DIAGNOSIS — E785 Hyperlipidemia, unspecified: Secondary | ICD-10-CM | POA: Diagnosis not present

## 2013-04-06 DIAGNOSIS — R0609 Other forms of dyspnea: Secondary | ICD-10-CM | POA: Diagnosis not present

## 2013-04-06 DIAGNOSIS — E875 Hyperkalemia: Secondary | ICD-10-CM

## 2013-04-06 DIAGNOSIS — R079 Chest pain, unspecified: Secondary | ICD-10-CM | POA: Diagnosis not present

## 2013-04-06 DIAGNOSIS — I2510 Atherosclerotic heart disease of native coronary artery without angina pectoris: Secondary | ICD-10-CM | POA: Diagnosis not present

## 2013-04-06 DIAGNOSIS — I209 Angina pectoris, unspecified: Secondary | ICD-10-CM | POA: Insufficient documentation

## 2013-04-06 DIAGNOSIS — Z95810 Presence of automatic (implantable) cardiac defibrillator: Secondary | ICD-10-CM

## 2013-04-06 DIAGNOSIS — I10 Essential (primary) hypertension: Secondary | ICD-10-CM | POA: Diagnosis not present

## 2013-04-06 DIAGNOSIS — I498 Other specified cardiac arrhythmias: Secondary | ICD-10-CM | POA: Insufficient documentation

## 2013-04-06 DIAGNOSIS — Z951 Presence of aortocoronary bypass graft: Secondary | ICD-10-CM

## 2013-04-06 DIAGNOSIS — R002 Palpitations: Secondary | ICD-10-CM | POA: Diagnosis not present

## 2013-04-06 DIAGNOSIS — R0989 Other specified symptoms and signs involving the circulatory and respiratory systems: Secondary | ICD-10-CM | POA: Insufficient documentation

## 2013-04-06 DIAGNOSIS — I208 Other forms of angina pectoris: Secondary | ICD-10-CM | POA: Diagnosis present

## 2013-04-06 HISTORY — DX: Other specified cardiac arrhythmias: I49.8

## 2013-04-06 HISTORY — DX: Unspecified right bundle-branch block: I45.10

## 2013-04-06 HISTORY — DX: Cardiac arrhythmia, unspecified: I49.9

## 2013-04-06 HISTORY — DX: Disorder of arteries and arterioles, unspecified: I77.9

## 2013-04-06 HISTORY — DX: Cardiac arrest, cause unspecified: I46.9

## 2013-04-06 HISTORY — DX: Other specified abnormal findings of blood chemistry: R79.89

## 2013-04-06 HISTORY — DX: Abnormal results of liver function studies: R94.5

## 2013-04-06 HISTORY — DX: Peripheral vascular disease, unspecified: I73.9

## 2013-04-06 LAB — POCT I-STAT, CHEM 8
BUN: 15 mg/dL (ref 6–23)
Calcium, Ion: 1.4 mmol/L — ABNORMAL HIGH (ref 1.13–1.30)
Chloride: 105 mEq/L (ref 96–112)
Creatinine, Ser: 1 mg/dL (ref 0.50–1.35)
Glucose, Bld: 97 mg/dL (ref 70–99)
HCT: 34 % — ABNORMAL LOW (ref 39.0–52.0)
Hemoglobin: 11.6 g/dL — ABNORMAL LOW (ref 13.0–17.0)
Potassium: 4.1 mEq/L (ref 3.5–5.1)
Sodium: 139 mEq/L (ref 135–145)
TCO2: 27 mmol/L (ref 0–100)

## 2013-04-06 LAB — CBC
HCT: 36.8 % — ABNORMAL LOW (ref 39.0–52.0)
Hemoglobin: 12.6 g/dL — ABNORMAL LOW (ref 13.0–17.0)
MCH: 29.6 pg (ref 26.0–34.0)
MCHC: 34.2 g/dL (ref 30.0–36.0)
MCV: 86.6 fL (ref 78.0–100.0)
Platelets: 143 10*3/uL — ABNORMAL LOW (ref 150–400)
RBC: 4.25 MIL/uL (ref 4.22–5.81)
RDW: 14.5 % (ref 11.5–15.5)
WBC: 9.8 10*3/uL (ref 4.0–10.5)

## 2013-04-06 LAB — CBC WITH DIFFERENTIAL/PLATELET
Basophils Absolute: 0 10*3/uL (ref 0.0–0.1)
Basophils Relative: 0 % (ref 0–1)
Eosinophils Absolute: 0.2 10*3/uL (ref 0.0–0.7)
Eosinophils Relative: 2 % (ref 0–5)
HCT: 34.4 % — ABNORMAL LOW (ref 39.0–52.0)
Hemoglobin: 11.4 g/dL — ABNORMAL LOW (ref 13.0–17.0)
Lymphocytes Relative: 30 % (ref 12–46)
Lymphs Abs: 2.4 10*3/uL (ref 0.7–4.0)
MCH: 28.6 pg (ref 26.0–34.0)
MCHC: 33.1 g/dL (ref 30.0–36.0)
MCV: 86.2 fL (ref 78.0–100.0)
Monocytes Absolute: 0.5 10*3/uL (ref 0.1–1.0)
Monocytes Relative: 7 % (ref 3–12)
Neutro Abs: 4.9 10*3/uL (ref 1.7–7.7)
Neutrophils Relative %: 61 % (ref 43–77)
Platelets: 139 10*3/uL — ABNORMAL LOW (ref 150–400)
RBC: 3.99 MIL/uL — ABNORMAL LOW (ref 4.22–5.81)
RDW: 14.4 % (ref 11.5–15.5)
WBC: 8 10*3/uL (ref 4.0–10.5)

## 2013-04-06 LAB — TROPONIN I: Troponin I: <0.3 ng/mL (ref <0.30)

## 2013-04-06 LAB — POCT I-STAT TROPONIN I: Troponin i, poc: 0 ng/mL (ref 0.00–0.08)

## 2013-04-06 LAB — MAGNESIUM: Magnesium: 2.1 mg/dL (ref 1.5–2.5)

## 2013-04-06 LAB — D-DIMER, QUANTITATIVE: D-Dimer, Quant: 0.6 ug/mL-FEU — ABNORMAL HIGH (ref 0.00–0.48)

## 2013-04-06 LAB — CREATININE, SERUM
Creatinine, Ser: 1.09 mg/dL (ref 0.50–1.35)
GFR calc Af Amer: 73 mL/min — ABNORMAL LOW (ref >90)
GFR calc non Af Amer: 63 mL/min — ABNORMAL LOW (ref >90)

## 2013-04-06 MED ORDER — VITAMIN D3 25 MCG (1000 UNIT) PO TABS
1000.0000 [IU] | ORAL_TABLET | Freq: Every day | ORAL | Status: DC
  Administered 2013-04-07: 1000 [IU] via ORAL
  Filled 2013-04-06: qty 1

## 2013-04-06 MED ORDER — NITROGLYCERIN 0.4 MG SL SUBL: 0.4000 mg | SUBLINGUAL_TABLET | SUBLINGUAL | Status: DC | PRN

## 2013-04-06 MED ORDER — METOPROLOL TARTRATE 50 MG PO TABS
50.0000 mg | ORAL_TABLET | Freq: Two times a day (BID) | ORAL | Status: DC
  Administered 2013-04-06: 50 mg via ORAL
  Filled 2013-04-06 (×3): qty 1

## 2013-04-06 MED ORDER — CLOPIDOGREL BISULFATE 75 MG PO TABS: 75.0000 mg | ORAL_TABLET | Freq: Every day | ORAL | Status: DC

## 2013-04-06 MED ORDER — ISOSORBIDE MONONITRATE ER 30 MG PO TB24
30.0000 mg | ORAL_TABLET | Freq: Every day | ORAL | Status: DC
  Administered 2013-04-07: 30 mg via ORAL
  Filled 2013-04-06: qty 1

## 2013-04-06 MED ORDER — ASPIRIN 81 MG PO CHEW
81.0000 mg | CHEWABLE_TABLET | Freq: Every day | ORAL | Status: DC
  Administered 2013-04-07: 81 mg via ORAL
  Filled 2013-04-06: qty 1

## 2013-04-06 MED ORDER — ACETAMINOPHEN 325 MG PO TABS: 325.0000 mg | ORAL_TABLET | ORAL | Status: DC | PRN

## 2013-04-06 MED ORDER — SODIUM CHLORIDE 0.9 % IV SOLN: 250.0000 mL | INTRAVENOUS | Status: DC | PRN

## 2013-04-06 MED ORDER — ASPIRIN 81 MG PO CHEW
324.0000 mg | CHEWABLE_TABLET | ORAL | Status: AC
  Administered 2013-04-06: 324 mg via ORAL
  Filled 2013-04-06: qty 4

## 2013-04-06 MED ORDER — SODIUM CHLORIDE 0.9 % IJ SOLN
3.0000 mL | Freq: Two times a day (BID) | INTRAMUSCULAR | Status: DC
  Administered 2013-04-06: 3 mL via INTRAVENOUS

## 2013-04-06 MED ORDER — FAMOTIDINE 20 MG PO TABS
20.0000 mg | ORAL_TABLET | Freq: Two times a day (BID) | ORAL | Status: DC | PRN
  Filled 2013-04-06: qty 1

## 2013-04-06 MED ORDER — ALFUZOSIN HCL ER 10 MG PO TB24
10.0000 mg | ORAL_TABLET | Freq: Every evening | ORAL | Status: DC
  Filled 2013-04-06: qty 1

## 2013-04-06 MED ORDER — ADULT MULTIVITAMIN W/MINERALS CH
1.0000 | ORAL_TABLET | Freq: Every day | ORAL | Status: DC
  Administered 2013-04-07: 1 via ORAL
  Filled 2013-04-06: qty 1

## 2013-04-06 MED ORDER — ONDANSETRON HCL 4 MG/2ML IJ SOLN: 4.0000 mg | Freq: Four times a day (QID) | INTRAMUSCULAR | Status: DC | PRN

## 2013-04-06 MED ORDER — CLOPIDOGREL BISULFATE 75 MG PO TABS
75.0000 mg | ORAL_TABLET | Freq: Every day | ORAL | Status: DC
  Administered 2013-04-07: 75 mg via ORAL
  Filled 2013-04-06: qty 1

## 2013-04-06 MED ORDER — HEPARIN SODIUM (PORCINE) 5000 UNIT/ML IJ SOLN
5000.0000 [IU] | Freq: Three times a day (TID) | INTRAMUSCULAR | Status: DC
  Administered 2013-04-06 – 2013-04-07 (×2): 5000 [IU] via SUBCUTANEOUS
  Filled 2013-04-06 (×5): qty 1

## 2013-04-06 MED ORDER — SODIUM CHLORIDE 0.9 % IJ SOLN: 3.0000 mL | INTRAMUSCULAR | Status: DC | PRN

## 2013-04-06 MED ORDER — VITAMIN D3 25 MCG (1000 UT) PO CAPS: 1.0000 | ORAL_CAPSULE | Freq: Every day | ORAL | Status: DC

## 2013-04-06 NOTE — ED Notes (Signed)
[employer] [facility] representative at bedside

## 2013-04-06 NOTE — ED Notes (Signed)
[employer] [facility] pacemaker interrogated, Effie Shy, MD notified. Awaiting interrogation results

## 2013-04-06 NOTE — ED Notes (Signed)
Meal ordered for patient  Requested Boost to drink.

## 2013-04-06 NOTE — H&P (Signed)
History and Physical  Patient ID: [name] Christensen MRN: [id number], DOB: 1932-07-14 Date of Encounter: 04/06/2013, 6:13 PM Primary Physician: [name] Primes, MD Primary Cardiologist: [name] Christensen  Chief Complaint: low HR reading, CP Reason for Admission: CP  HPI: [name] Christensen is a 79 y/o M with history of CAD s/p CABG 2005, chronic RBBB, out-of-Christensen cardiac arrest 10/2012 with EF 40-45% at that time, patent grafts by cath (atretic LIMA and patent native LAD unchanged from prior cath), and subsequent implantation of ICD at that time. Event monitor 01/2013 showed SR with PVCs and occasional ventricular bigeminy. He presented to [name] today with complaints of low heartrate on blood pressure cuff of 38-40 with normal blood pressure at that time. He was asymptomatic. He was not aware that ICD had backup pacing in place, thus became very concerned and presented to the ER where he was found to be in NSR with PVCs and intermittent ventricular bigeminy. As an aside, he does note occasional exertional chest tightness. He works out 6 days a week on the treadmill and with cardiac rehab. Today he walked until the 13 minute mark today then had to stop due to chest tightness. This resolved with rest. He does not get chest tightness every time he exercises; it is only intermittently.  He has not had any rest pain. His wife states he was SOB walking to the mailbox yesterday in the hot heat, but the patient was unaware of this. He denies nausea, vomiting, LEE, orthopnea, PND, weight change, bleeding issues, palpitations, near-syncope or syncope. He does note general fatigue. Troponin neg x 1, Hgb 11.4, plt 139, d-dimer minimally elevated at 0.60, CXR without significant change. Vital signs in the ER have been stable - the automatic monitor has registered his pulse in EPIC in the 30s-40s but this is pseudobradycardia in the setting of ventricular bigeminy. He is not tachycardic, tachypnic or hypoxic. He presently  feels well without complaints.  BP is stable. Interrogation of his device shows no significant events.  Past Medical History  Diagnosis Date  . Coronary artery disease     a. s/p MI/CABG 2005. b. s/p cath at time of VF arrest 10/2013 - grafts patent.  [name] GERD (gastroesophageal reflux disease)   . Hyperlipidemia   . Hypertension   . BPH (benign prostatic hypertrophy)     elevated PSA Dr. Lindell Noe Bx 2010  . RBBB   . Cardiac arrest     a. out-of-Christensen arrest 10/2013 - EF 40-45%, patent grafts on cath, received [employer] AICD.  [name] Ventricular bigeminy     a. Event monitor 01/2013: NSR with PVCs and occ bigeminy.  . Elevated LFTs     a. 10/2012 felt due to cardiac arrest - Hepatitis C Ab reactive from 11/06/2012>>Hep C RNA PCR negative 11/07/2012.   . Carotid disease, bilateral     a. 0-39% by doppers.     Most Recent Cardiac Studies: 2D Echo 10/2012 Study Conclusions - Procedure narrative: Transthoracic echocardiography. Image quality was adequate. The study was technically difficult. - Left ventricle: The cavity size was normal. Wall thickness was normal. Systolic function was mildly to moderately reduced. The estimated ejection fraction was in the range of 40% to 45%. The basal to mid posterior and anterolateral walls were severely hypokinetic. Features are consistent with a pseudonormal left ventricular filling pattern, with concomitant abnormal relaxation and increased filling pressure (grade 2 diastolic dysfunction). - Aortic valve: Trileaflet; moderately calcified leaflets. Sclerosis without stenosis. - Mitral valve: No significant  History and Physical  Patient ID: [name] Christensen MRN: [id number], DOB: 1932-07-14 Date of Encounter: 04/06/2013, 6:13 PM Primary Physician: [name] Primes, MD Primary Cardiologist: [name] Christensen  Chief Complaint: low HR reading, CP Reason for Admission: CP  HPI: [name] Christensen is a 79 y/o M with history of CAD s/p CABG 2005, chronic RBBB, out-of-Christensen cardiac arrest 10/2012 with EF 40-45% at that time, patent grafts by cath (atretic LIMA and patent native LAD unchanged from prior cath), and subsequent implantation of ICD at that time. Event monitor 01/2013 showed SR with PVCs and occasional ventricular bigeminy. He presented to [name] today with complaints of low heartrate on blood pressure cuff of 38-40 with normal blood pressure at that time. He was asymptomatic. He was not aware that ICD had backup pacing in place, thus became very concerned and presented to the ER where he was found to be in NSR with PVCs and intermittent ventricular bigeminy. As an aside, he does note occasional exertional chest tightness. He works out 6 days a week on the treadmill and with cardiac rehab. Today he walked until the 13 minute mark today then had to stop due to chest tightness. This resolved with rest. He does not get chest tightness every time he exercises; it is only intermittently.  He has not had any rest pain. His wife states he was SOB walking to the mailbox yesterday in the hot heat, but the patient was unaware of this. He denies nausea, vomiting, LEE, orthopnea, PND, weight change, bleeding issues, palpitations, near-syncope or syncope. He does note general fatigue. Troponin neg x 1, Hgb 11.4, plt 139, d-dimer minimally elevated at 0.60, CXR without significant change. Vital signs in the ER have been stable - the automatic monitor has registered his pulse in EPIC in the 30s-40s but this is pseudobradycardia in the setting of ventricular bigeminy. He is not tachycardic, tachypnic or hypoxic. He presently  feels well without complaints.  BP is stable. Interrogation of his device shows no significant events.  Past Medical History  Diagnosis Date  . Coronary artery disease     a. s/p MI/CABG 2005. b. s/p cath at time of VF arrest 10/2013 - grafts patent.  [name] GERD (gastroesophageal reflux disease)   . Hyperlipidemia   . Hypertension   . BPH (benign prostatic hypertrophy)     elevated PSA Dr. Lindell Noe Bx 2010  . RBBB   . Cardiac arrest     a. out-of-Christensen arrest 10/2013 - EF 40-45%, patent grafts on cath, received [employer] AICD.  [name] Ventricular bigeminy     a. Event monitor 01/2013: NSR with PVCs and occ bigeminy.  . Elevated LFTs     a. 10/2012 felt due to cardiac arrest - Hepatitis C Ab reactive from 11/06/2012>>Hep C RNA PCR negative 11/07/2012.   . Carotid disease, bilateral     a. 0-39% by doppers.     Most Recent Cardiac Studies: 2D Echo 10/2012 Study Conclusions - Procedure narrative: Transthoracic echocardiography. Image quality was adequate. The study was technically difficult. - Left ventricle: The cavity size was normal. Wall thickness was normal. Systolic function was mildly to moderately reduced. The estimated ejection fraction was in the range of 40% to 45%. The basal to mid posterior and anterolateral walls were severely hypokinetic. Features are consistent with a pseudonormal left ventricular filling pattern, with concomitant abnormal relaxation and increased filling pressure (grade 2 diastolic dysfunction). - Aortic valve: Trileaflet; moderately calcified leaflets. Sclerosis without stenosis. - Mitral valve: No significant  regurgitation. - Left atrium: The atrium was mildly dilated. - Right ventricle: Poorly visualized. The cavity size was normal. Systolic function was normal. - Pulmonary arteries: No complete TR doppler jet so unable to estimate PA systolic pressure. - Inferior vena cava: The vessel was dilated; the respirophasic diameter changes were blunted (<  50%); findings are consistent with elevated central venous pressure. Impressions: - Technically difficult study with poor acoustic windows. Normal LV size and systolic function, EF 40-45% with severe hypokinesis of the posterior and anterolateral walls. Moderate diastolic dysfunction. The RV was poorly visualized but probably normal. Dilated IVC.  Cardiac Cath 10/2012 Hemodynamics:  AO 172/72 with a mean of 113 mmHg  LV 177/7 mmHg  Coronary angiography:  Coronary dominance: right  Left mainstem: The left main coronary is calcified. It has no significant disease.  Left anterior descending (LAD): The LAD is moderately calcified in the proximal and mid vessel. The mid vessel there is a segmental 40-50% stenosis. There is a small diagonal branch which is occluded.  Left circumflex (LCx): The left circumflex gives rise to 3 marginal branches. There is 30% stenosis in the proximal circumflex. The first obtuse marginal vessel is occluded. In the second marginal branch there is a long stented segment with 50-60% in-stent stenosis. The distal OM has 70% stenosis.  Right coronary artery (RCA): The right coronary is heavily calcified throughout. It is severely diseased at the ostium and in the proximal vessel up to 90%. It is occluded in the mid vessel.  The saphenous vein graft to the RCA is widely patent with good runoff.  The saphenous vein graft to the diagonal branch is also widely patent. It has several valves within it with mild 30% disease diffusely.  Saphenous vein graft sequentially to the first and second obtuse marginal vessels his widely patent. It also has several valves noted without significant disease.  The LIMA graft to the LAD is small and diffusely atretic. It appears severely diseased in the distal graft.  Left ventriculography: Left ventricular systolic function is normal, LVEF is estimated at 55-65%, there is no significant mitral regurgitation  Final Conclusions:  1. Severe  three-vessel obstructive coronary disease.  2. All vein grafts are widely patent including saphenous vein graft to the diagonal, saphenous vein graft sequentially to the first and second obtuse marginal vessels, and saphenous vein graft to the RCA.  3. The atretic LIMA graft to the LAD. However the native LAD has no significant obstructive disease. This is unchanged from prior cardiac catheterization 2007.  4. Normal left ventricular function.  Recommendations: Continue medical management of his coronary disease. We will consider the patient for ICD.  Event monitor 01/2013 - see chart   Surgical History:  Past Surgical History  Procedure Laterality Date  . Cardiac catheterization  2007    with patent graft anatomy atretic left internal mammary  artery to the LAD which is nonobstructive. Will restart study June 08, 2007  . Coronary artery bypass graft  2005  . Lumbar fusion  09/2007  . Cardiac defibrillator placement  dec 2013    Dr. Graciela Husbands  . Appendectomy       Home Meds: Prior to Admission medications   Medication Sig Start Date End Date Taking? Authorizing Provider  acetaminophen (TYLENOL) 325 MG tablet Take 325-650 mg by mouth every 4 (four) hours as needed. For pain 10/25/2012  Yes Simonne Martinet, NP  alfuzosin (UROXATRAL) 10 MG 24 hr tablet Take 10 mg by mouth every evening.  History and Physical  Patient ID: [name] Christensen MRN: [id number], DOB: 1932-07-14 Date of Encounter: 04/06/2013, 6:13 PM Primary Physician: [name] Primes, MD Primary Cardiologist: [name] Christensen  Chief Complaint: low HR reading, CP Reason for Admission: CP  HPI: [name] Christensen is a 79 y/o M with history of CAD s/p CABG 2005, chronic RBBB, out-of-Christensen cardiac arrest 10/2012 with EF 40-45% at that time, patent grafts by cath (atretic LIMA and patent native LAD unchanged from prior cath), and subsequent implantation of ICD at that time. Event monitor 01/2013 showed SR with PVCs and occasional ventricular bigeminy. He presented to [name] today with complaints of low heartrate on blood pressure cuff of 38-40 with normal blood pressure at that time. He was asymptomatic. He was not aware that ICD had backup pacing in place, thus became very concerned and presented to the ER where he was found to be in NSR with PVCs and intermittent ventricular bigeminy. As an aside, he does note occasional exertional chest tightness. He works out 6 days a week on the treadmill and with cardiac rehab. Today he walked until the 13 minute mark today then had to stop due to chest tightness. This resolved with rest. He does not get chest tightness every time he exercises; it is only intermittently.  He has not had any rest pain. His wife states he was SOB walking to the mailbox yesterday in the hot heat, but the patient was unaware of this. He denies nausea, vomiting, LEE, orthopnea, PND, weight change, bleeding issues, palpitations, near-syncope or syncope. He does note general fatigue. Troponin neg x 1, Hgb 11.4, plt 139, d-dimer minimally elevated at 0.60, CXR without significant change. Vital signs in the ER have been stable - the automatic monitor has registered his pulse in EPIC in the 30s-40s but this is pseudobradycardia in the setting of ventricular bigeminy. He is not tachycardic, tachypnic or hypoxic. He presently  feels well without complaints.  BP is stable. Interrogation of his device shows no significant events.  Past Medical History  Diagnosis Date  . Coronary artery disease     a. s/p MI/CABG 2005. b. s/p cath at time of VF arrest 10/2013 - grafts patent.  [name] GERD (gastroesophageal reflux disease)   . Hyperlipidemia   . Hypertension   . BPH (benign prostatic hypertrophy)     elevated PSA Dr. Lindell Noe Bx 2010  . RBBB   . Cardiac arrest     a. out-of-Christensen arrest 10/2013 - EF 40-45%, patent grafts on cath, received [employer] AICD.  [name] Ventricular bigeminy     a. Event monitor 01/2013: NSR with PVCs and occ bigeminy.  . Elevated LFTs     a. 10/2012 felt due to cardiac arrest - Hepatitis C Ab reactive from 11/06/2012>>Hep C RNA PCR negative 11/07/2012.   . Carotid disease, bilateral     a. 0-39% by doppers.     Most Recent Cardiac Studies: 2D Echo 10/2012 Study Conclusions - Procedure narrative: Transthoracic echocardiography. Image quality was adequate. The study was technically difficult. - Left ventricle: The cavity size was normal. Wall thickness was normal. Systolic function was mildly to moderately reduced. The estimated ejection fraction was in the range of 40% to 45%. The basal to mid posterior and anterolateral walls were severely hypokinetic. Features are consistent with a pseudonormal left ventricular filling pattern, with concomitant abnormal relaxation and increased filling pressure (grade 2 diastolic dysfunction). - Aortic valve: Trileaflet; moderately calcified leaflets. Sclerosis without stenosis. - Mitral valve: No significant

## 2013-04-06 NOTE — ED Notes (Signed)
Sandy with [facility] to come to bedside for interrogation, Effie Shy, MD notified

## 2013-04-06 NOTE — ED Notes (Signed)
[facility] summary given to Effie Shy, MD

## 2013-04-06 NOTE — ED Notes (Signed)
Portable Xray at bedside.

## 2013-04-06 NOTE — ED Provider Notes (Signed)
History     CSN: [id number]  Arrival date & time 04/06/13  1357   First MD Initiated Contact with Patient 04/06/13 1424      Chief Complaint  Patient presents with  . Chest Pain    (Consider location/radiation/quality/duration/timing/severity/associated sxs/prior treatment) HPI Comments: [name] Christensen is a 79 y.o. male who is here for evaluation of low heartbeat. He checked his blood pressure, this morning, and found it ranging between 30 and 45. At that time. He was asymptomatic. Earlier today. He had gone to the gym, to exercise, was able to cycle, but when he got on the treadmill and some chest tightness, after walking 13 minutes. He stopped at the 15 minute mark, and a chest pain, resolved. He has had other similar episodes of chest pain over the last 6 weeks, but not always when he exercises. He goes to the gym 6 days a week. He denies similar episodes of bradycardia. He has not felt his defibrillator fire. He has been eating well. He denies fever, chills, shortness of breath, nausea, vomiting, weakness, dizziness, diaphoresis, nausea or vomiting. He is taking his usual medications. There are no other known modifying factors.  Patient is a 79 y.o. male presenting with chest pain. The history is provided by the patient.  Chest Pain   Past Medical History  Diagnosis Date  . Coronary artery disease     s/p coronary bypass grafting 2005  . GERD (gastroesophageal reflux disease)   . Hyperlipidemia   . Hypertension   . BPH (benign prostatic hypertrophy)     elevated PSA Dr. Lindell Noe Bx 2010  . Heart attack 2005  . Acute MI     Past Surgical History  Procedure Laterality Date  . Cardiac catheterization  2007    with patent graft anatomy atretic left internal mammary  artery to the LAD which is nonobstructive. Will restart study June 08, 2007  . Coronary artery bypass graft  2005  . Lumbar fusion  09/2007  . Cardiac defibrillator placement  dec 2013    Dr. Graciela Husbands  .  Appendectomy      Family History  Problem Relation Age of Onset  . Coronary artery disease Mother   . Heart attack Brother     History  Substance Use Topics  . Smoking status: Never Smoker   . Smokeless tobacco: Never Used  . Alcohol Use: No      Review of Systems  Cardiovascular: Positive for chest pain.  All other systems reviewed and are negative.    Allergies  Ezetimibe; Niacin; Penicillins; Pravastatin sodium; and Rosuvastatin  Home Medications   Current Outpatient Rx  Name  Route  Sig  Dispense  Refill  . acetaminophen (TYLENOL) 325 MG tablet   Oral   Take 325-650 mg by mouth every 4 (four) hours as needed. For pain         . alfuzosin (UROXATRAL) 10 MG 24 hr tablet   Oral   Take 10 mg by mouth every evening.          [name] aspirin 81 MG chewable tablet   Oral   Chew 1 tablet (81 mg total) by mouth daily.         [name] atenolol (TENORMIN) 50 MG tablet   Oral   Take 1 tablet (50 mg total) by mouth 2 (two) times daily.   180 tablet   3   . Cholecalciferol (VITAMIN D3) 1000 UNITS CAPS   Oral   Take 1 capsule by  mouth daily.           . clopidogrel (PLAVIX) 75 MG tablet   Oral   Take 1 tablet (75 mg total) by mouth daily.   30 tablet   5     Generic please   . Multiple Vitamin (MULTIVITAMIN WITH MINERALS) TABS   Oral   Take 1 tablet by mouth daily.         . nitroGLYCERIN (NITROSTAT) 0.4 MG SL tablet   Sublingual   Place 0.4 mg under the tongue every 5 (five) minutes as needed. For chest pain         . ranitidine (ZANTAC) 150 MG capsule   Oral   Take 150 mg by mouth 2 (two) times daily as needed for heartburn.         [name] atorvastatin (LIPITOR) 40 MG tablet   Oral   Take 1 tablet (40 mg total) by mouth every other day.   90 tablet   3     BP 164/70  Pulse 62  Temp(Src) 98.1 F (36.7 C) (Oral)  Resp 22  SpO2 96%  Physical Exam  Nursing note and vitals reviewed. Constitutional: He is oriented to person, place, and time. He  appears well-developed and well-nourished.  HENT:  Head: Normocephalic and atraumatic.  Right Ear: External ear normal.  Left Ear: External ear normal.  Eyes: Conjunctivae and EOM are normal. Pupils are equal, round, and reactive to light.  Neck: Normal range of motion and phonation normal. Neck supple.  Cardiovascular: Normal rate, regular rhythm, normal heart sounds and intact distal pulses.   Radial pulse is one half of heart rate (about 35). He perfuses every other beat.  Pulmonary/Chest: Effort normal and breath sounds normal. He exhibits no bony tenderness.  Abdominal: Soft. Normal appearance. There is no tenderness.  Musculoskeletal: Normal range of motion.  Neurological: He is alert and oriented to person, place, and time. He has normal strength. No cranial nerve deficit or sensory deficit. He exhibits normal muscle tone. Coordination normal.  Skin: Skin is warm, dry and intact.  Psychiatric: He has a normal mood and affect. His behavior is normal. Judgment and thought content normal.    ED Course  Procedures (including critical care time)  Patient Vitals for the past 24 hrs:  BP Temp Temp src Pulse Resp SpO2  04/06/13 1700 164/70 mmHg - - 62 22 96 %  04/06/13 1600 149/70 mmHg - - 62 20 96 %  04/06/13 1545 146/62 mmHg - - 62 17 96 %  04/06/13 1530 151/52 mmHg - - 34 18 96 %  04/06/13 1500 133/48 mmHg - - 35 18 99 %  04/06/13 1445 139/58 mmHg - - 36 21 98 %  04/06/13 1430 134/52 mmHg - - 38 19 96 %  04/06/13 1403 146/58 mmHg 98.1 F (36.7 C) Oral 81 - 97 %    5:10 PM-Consult complete with [facility]. Patient case explained and discussed. they agree to evaluate patient for further evaluation and treatment; in the ED.  Call ended at 16:12  Defibrillator interrogation by [facility] Staff- No lethal arrhythymias     Date: 04/06/13  Rate: 76  Rhythm: sinus with PVC in Bigeminy, and compensatory pause  QRS Axis: normal  PR and QT Intervals: PR prolonged  ST/T Wave  abnormalities: normal  PR and QRS Conduction Disutrbances:first-degree A-V block , RBBB  Narrative Interpretation:   Old EKG Reviewed: changes noted- ventricular ectopy is new since 11/15/2012   Labs Reviewed  CBC WITH DIFFERENTIAL - Abnormal; Notable for the following:    RBC 3.99 (*)    Hemoglobin 11.4 (*)    HCT 34.4 (*)    Platelets 139 (*)    All other components within normal limits  D-DIMER, QUANTITATIVE - Abnormal; Notable for the following:    D-Dimer, Quant 0.60 (*)    All other components within normal limits  POCT I-STAT, CHEM 8 - Abnormal; Notable for the following:    Calcium, Ion 1.40 (*)    Hemoglobin 11.6 (*)    HCT 34.0 (*)    All other components within normal limits  POCT I-STAT TROPONIN I   Dg Chest Port 1 View  04/06/2013  *RADIOLOGY REPORT*  Clinical Data: Palpitations  PORTABLE CHEST - 1 VIEW  Comparison: 3/6/for  Findings: Cardiomediastinal silhouette is stable.  Single lead cardiac pacemaker is unchanged in position.  Stable scarring in the right mid and right lower lung.  No acute infiltrate or pulmonary edema.  IMPRESSION: No active disease.  No significant change.   Original Report Authenticated By: Natasha Mead, M.D.      1. Ventricular bigeminy   2. Nonspecific chest pain       MDM  Bradycardia, secondary to frequent PVCs in a pattern of bigeminy. No evidence for ACS, PE, or pneumonia. Minimal d-dimer elevation, less than 1, without persistent, shortness of breath; places him at a very low risk for PE. I believe that this can be followed expectantly without advanced imaging. His cardiology service has been consulted and they will see him in emergency department.     Plan: Disposition in conjunction with Cardiology.    Flint Melter, MD 04/06/13 [phone]

## 2013-04-06 NOTE — ED Notes (Signed)
Spoke with [employer] representative awaiting return call re: interrogation results

## 2013-04-06 NOTE — ED Notes (Signed)
Chest tightness x 6 weeks ago that came and went got tired in the pm

## 2013-04-07 ENCOUNTER — Encounter (HOSPITAL_COMMUNITY): Payer: Medicare Other

## 2013-04-07 ENCOUNTER — Encounter (HOSPITAL_COMMUNITY): Payer: Self-pay | Admitting: Physician Assistant

## 2013-04-07 DIAGNOSIS — I208 Other forms of angina pectoris: Secondary | ICD-10-CM | POA: Diagnosis present

## 2013-04-07 DIAGNOSIS — Z95810 Presence of automatic (implantable) cardiac defibrillator: Secondary | ICD-10-CM

## 2013-04-07 DIAGNOSIS — I498 Other specified cardiac arrhythmias: Secondary | ICD-10-CM | POA: Diagnosis not present

## 2013-04-07 LAB — TROPONIN I: Troponin I: <0.3 ng/mL (ref <0.30)

## 2013-04-07 LAB — MRSA PCR SCREENING: MRSA by PCR: NEGATIVE

## 2013-04-07 LAB — T4, FREE: Free T4: 1.11 ng/dL (ref 0.80–1.80)

## 2013-04-07 LAB — TSH: TSH: 2.793 u[IU]/mL (ref 0.350–4.500)

## 2013-04-07 MED ORDER — ATORVASTATIN CALCIUM 40 MG PO TABS: 20.0000 mg | ORAL_TABLET | ORAL | Status: DC

## 2013-04-07 MED ORDER — CARVEDILOL 3.125 MG PO TABS: 3.1250 mg | ORAL_TABLET | Freq: Two times a day (BID) | ORAL | Status: DC

## 2013-04-07 MED ORDER — CARVEDILOL 3.125 MG PO TABS
3.1250 mg | ORAL_TABLET | Freq: Two times a day (BID) | ORAL | Status: DC
  Administered 2013-04-07: 3.125 mg via ORAL
  Filled 2013-04-07 (×3): qty 1

## 2013-04-07 MED ORDER — ATORVASTATIN CALCIUM 40 MG PO TABS: 20.0000 mg | ORAL_TABLET | Freq: Every day | ORAL | Status: DC

## 2013-04-07 NOTE — Progress Notes (Signed)
Patient ID: [name] Christensen, male   DOB: 12-04-1931, 79 y.o.   MRN: [id number]    Subjective:  Denies SSCP, palpitations or Dyspnea   Objective:  Filed Vitals:   04/06/13 2215 04/06/13 2239 04/06/13 2245 04/07/13 0541  BP: 138/59  164/76 145/74  Pulse: 63  71 55  Temp:   98.7 F (37.1 C) 98.1 F (36.7 C)  TempSrc:   Oral Oral  Resp: 18  18 18   Height:   5\' 8"  (1.727 m)   SpO2: 93% 96% 96% 94%    Intake/Output from previous day:  Intake/Output Summary (Last 24 hours) at 04/07/13 0754 Last data filed at 04/06/13 2337  Gross per 24 hour  Intake      3 ml  Output    725 ml  Net   -722 ml    Physical Exam: Affect appropriate Healthy:  appears stated age HEENT: normal Neck supple with no adenopathy JVP normal no bruits no thyromegaly Lungs clear with no wheezing and good diaphragmatic motion Heart:  S1/S2 no murmur, no rub, gallop or click PMI normal Abdomen: benighn, BS positve, no tenderness, no AAA no bruit.  No HSM or HJR Distal pulses intact with no bruits No edema Neuro non-focal Skin warm and dry No muscular weakness   Lab Results: Basic Metabolic Panel:  Recent Labs  40/98/11 1554 04/06/13 2259  NA 139  --   K 4.1  --   CL 105  --   GLUCOSE 97  --   BUN 15  --   CREATININE 1.00 1.09  MG  --  2.1   CBC:  Recent Labs  04/06/13 1532 04/06/13 1554 04/06/13 2259  WBC 8.0  --  9.8  NEUTROABS 4.9  --   --   HGB 11.4* 11.6* 12.6*  HCT 34.4* 34.0* 36.8*  MCV 86.2  --  86.6  PLT 139*  --  143*   Cardiac Enzymes:  Recent Labs  04/06/13 2259 04/07/13 0501  TROPONINI <0.30 <0.30   BNP: No components found with this basename: POCBNP,  D-Dimer:  Recent Labs  04/06/13 1532  DDIMER 0.60*    Imaging: Dg Chest Port 1 View  04/06/2013   *RADIOLOGY REPORT*  Clinical Data: Palpitations  PORTABLE CHEST - 1 VIEW  Comparison: 3/6/for  Findings: Cardiomediastinal silhouette is stable.  Single lead cardiac pacemaker is unchanged in position.   Stable scarring in the right mid and right lower lung.  No acute infiltrate or pulmonary edema.  IMPRESSION: No active disease.  No significant change.   Original Report Authenticated By: Natasha Mead, M.D.    Cardiac Studies:  ECG:  SR  Bigeminy first degree RBBB   Telemetry:  SR this am  Echo:   Medications:   . alfuzosin  10 mg Oral QPM  . aspirin  81 mg Oral Daily  . cholecalciferol  1,000 Units Oral Daily  . clopidogrel  75 mg Oral Q breakfast  . heparin  5,000 Units Subcutaneous Q8H  . isosorbide mononitrate  30 mg Oral Daily  . metoprolol tartrate  50 mg Oral BID  . multivitamin with minerals  1 tablet Oral Daily  . sodium chloride  3 mL Intravenous Q12H       Assessment/Plan:  Bradycardia.:  Has AICD with pacer back up.  Device working normally.  Change metoprolol to coreg and have EP Dr Graciela Husbands see regarding changing his lower rate limit To 60.  Otherwise looks good and go home today. Coreg 3.125 bid  Charlton Haws 04/07/2013, 7:54 AM

## 2013-04-07 NOTE — Discharge Summary (Signed)
CARDIOLOGY DISCHARGE SUMMARY   Patient ID: ZIQUAN MUSCH MRN: [id number] DOB/AGE: 77/19/1933 77 y.o.  Admit date: 04/06/2013 Discharge date: 04/07/2013  Primary Discharge Diagnosis: Anginal chest pain, medical therapy for coronary artery disease recommend   Secondary Discharge Diagnosis:    Bradycardia   HYPERLIPIDEMIA   HYPERTENSION   CORONARY ARTERY DISEASE  Hospital Course: TANOR MARTINA is a 79 y.o. male with a history of CAD. He was having exertional chest tightness and noted his heart rate to be less than 40 at times on his home blood pressure machine. He also had some increased dyspnea on exertion. He came to the emergency room where he was admitted for further evaluation and treatment.  His cardiac enzymes were negative for MI. A chest x-ray and other labs were reviewed and showed no critical abnormalities. He had mild thrombocytopenia but no bleeding issues. A lipid profile was performed with results reviewed. He still has mild dyslipidemia with a low HDL and elevated LDL but improvement in dietary compliance was recommended. He had recently stopped the Lipitor because of body aches. He is encouraged to restart the medication at a decreased dose and see if he will tolerate it.  On telemetry, and on his ECG, he was in sinus rhythm with a first-degree AV block and frequent PVCs, bigeminy at times. His blood pressure was normal or elevated but it was felt that his home blood pressure machine was under-sensing his PVCs and artificially lowering his heart rate. His atenolol was held and he was started on Coreg to see if this would have less effect on his heart rate. He has a single lead ICD ([employer]) and it was interrogated. The device was working normally. He is to follow up with Dr Graciela Husbands to decide on further management of his heart rate and PVCs. On 04/07/2013, Mr. Savidge was seen by Dr. [name]. He was doing well and considered stable for discharge, in improved condition, to follow up  as an outpatient.   Labs:   Lab Results  Component Value Date   WBC 9.8 04/06/2013   HGB 12.6* 04/06/2013   HCT 36.8* 04/06/2013   MCV 86.6 04/06/2013   PLT 143* 04/06/2013     Recent Labs Lab 04/06/13 1554 04/06/13 2259  NA 139  --   K 4.1  --   CL 105  --   BUN 15  --   CREATININE 1.00 1.09  GLUCOSE 97  --     Recent Labs  04/06/13 2259 04/07/13 0501  TROPONINI <0.30 <0.30   Lipid Panel     Component Value Date/Time   CHOL 177 01/09/2013 1026   TRIG 129.0 12/28/2012 1026   HDL 38.30* 01/20/2013 1026   CHOLHDL 5 01/09/2013 1026   VLDL 25.8 01/20/2013 1026   LDLCALC 113* 01/01/2013 1026    Radiology: Dg Chest Port 1 View 04/06/2013   *RADIOLOGY REPORT*  Clinical Data: Palpitations  PORTABLE CHEST - 1 VIEW  Comparison: 3/6/for  Findings: Cardiomediastinal silhouette is stable.  Single lead cardiac pacemaker is unchanged in position.  Stable scarring in the right mid and right lower lung.  No acute infiltrate or pulmonary edema.  IMPRESSION: No active disease.  No significant change.   Original Report Authenticated By: Natasha Mead, M.D.   EKG:  06-Apr-2013 14:01:21 Sinus rhythm with 1st degree A-V block with frequent Premature ventricular complexes and Possible Premature atrial complexes with Abberant conduction Possible Left atrial enlargement Right bundle branch block Abnormal ECG 53mm/s 1mm/mV 100Hz   8.0.1 12SL 237 CID: 2 Referred by: Confirmed By: Margarita Grizzle MD Vent. rate 76 BPM PR interval 258 ms QRS duration 162 ms QT/QTc 418/470 ms P-R-T axes 67 98 41  FOLLOW UP PLANS AND APPOINTMENTS Allergies  Allergen Reactions  . Ezetimibe     REACTION: numbness  . Niacin     REACTION: CP  . Penicillins Other (See Comments)    Blisters between fingers @ 15  . Pravastatin Sodium Other (See Comments)    Aches and pains in joints  . Rosuvastatin     REACTION: aches     Medication List    STOP taking these medications       atenolol 50 MG tablet  Commonly known  as:  TENORMIN      TAKE these medications       acetaminophen 325 MG tablet  Commonly known as:  TYLENOL  Take 325-650 mg by mouth every 4 (four) hours as needed. For pain     alfuzosin 10 MG 24 hr tablet  Commonly known as:  UROXATRAL  Take 10 mg by mouth every evening.     aspirin 81 MG chewable tablet  Chew 1 tablet (81 mg total) by mouth daily.     atorvastatin 40 MG tablet  Commonly known as:  LIPITOR  Take 0.5 tablets (20 mg total) by mouth every other day.     carvedilol 3.125 MG tablet  Commonly known as:  COREG  Take 1 tablet (3.125 mg total) by mouth 2 (two) times daily with a meal.     clopidogrel 75 MG tablet  Commonly known as:  PLAVIX  Take 1 tablet (75 mg total) by mouth daily.     multivitamin with minerals Tabs  Take 1 tablet by mouth daily.     nitroGLYCERIN 0.4 MG SL tablet  Commonly known as:  NITROSTAT  Place 0.4 mg under the tongue every 5 (five) minutes as needed. For chest pain     ranitidine 150 MG capsule  Commonly known as:  ZANTAC  Take 150 mg by mouth 2 (two) times daily as needed for heartburn.     Vitamin D3 1000 UNITS Caps  Take 1 capsule by mouth daily.         Future Appointments Provider Department Dept Phone   03/27/2013 10:15 AM Duke Salvia, MD [facility] Main Office [place]) [phone]   04/16/2013 8:45 AM Tresa Garter, MD [facility] [phone]   05/18/2013 10:45 AM Wendall Stade, MD [facility] Main Office [place]) [phone]   05/24/2013 8:35 AM [facility] [facility] Main Office [place]) [phone]     Follow-up Information   Follow up with Charlton Haws, MD On 05/18/2013. (at 10:45 am)    Contact information:   [address] [place] [address] [phone]       Follow up with Sherryl Manges, MD On 04/17/2013. (at 10:15 am)    Contact information:   [address] [place]  [address] [phone]       BRING ALL MEDICATIONS WITH YOU TO FOLLOW UP APPOINTMENTS  Time spent with patient to include physician time: 38 min Signed: Theodore Demark, PA-C 04/07/2013, 10:09 AM Co-Sign MD

## 2013-04-07 NOTE — Progress Notes (Signed)
DC orders received.  Patient stable with no S/S of distress.  Medication and discharge information reviewed with patient and patient's family.  Patient DC home with wife. [place], Mitzi Hansen

## 2013-04-08 ENCOUNTER — Encounter: Payer: Self-pay | Admitting: Internal Medicine

## 2013-04-08 ENCOUNTER — Ambulatory Visit (INDEPENDENT_AMBULATORY_CARE_PROVIDER_SITE_OTHER): Payer: Medicare Other | Admitting: Internal Medicine

## 2013-04-08 VITALS — BP 110/68 | HR 66 | Ht 68.0 in | Wt 156.8 lb

## 2013-04-08 DIAGNOSIS — I498 Other specified cardiac arrhythmias: Secondary | ICD-10-CM | POA: Diagnosis not present

## 2013-04-08 DIAGNOSIS — I469 Cardiac arrest, cause unspecified: Secondary | ICD-10-CM

## 2013-04-08 NOTE — Patient Instructions (Signed)
Your physician has recommended you make the following change in your medication:  1) restart atenolol 50 mg one tablet at night  Remote monitoring is used to monitor your Pacemaker of ICD from home. This monitoring reduces the number of office visits required to check your device to one time per year. It allows [place] to keep an eye on the functioning of your device to ensure it is working properly. You are scheduled for a device check from home on 07/24/2013. You may send your transmission at any time that day. If you have a wireless device, the transmission will be sent automatically. After your physician reviews your transmission, you will receive a postcard with your next transmission date.   Your physician wants you to follow-up in: December with Dr. Graciela Husbands. You will receive a reminder letter in the mail two months in advance. If you don't receive a letter, please call our office to schedule the follow-up appointment.

## 2013-04-08 NOTE — Progress Notes (Signed)
Patient Care Team: Tresa Garter, MD as PCP - General (Internal Medicine) [name] Stade, MD as Attending Physician (Cardiology)   HPI  [name] Christensen is a 79 y.o. male Seen following discharge from the hospital 2 days ago because of the admission for chest pain and bradycardia for which he was found to have bigeminy.  He had noted that he had had fatigue for the antecedent 6-8 weeks.  Dr. Jamse Mead change his atenolol to carvedilol which has been associated with dizziness and headache.   He has a history of ischemic heart disease with prior bypass surgery. His cardiac enzymes were negative. He is status post ICD implantation-single chamber undertaken for aborted cardiac arrest.  Catheterization 12/13 demonstrating patent grafts with an atretic LIMA; LV function was normal     Past Medical History  Diagnosis Date  . Coronary artery disease     a. s/p MI/CABG 2005. b. s/p cath at time of VF arrest 10/2013 - grafts patent.  [name] Christensen GERD (gastroesophageal reflux disease)   . Hyperlipidemia   . Hypertension   . BPH (benign prostatic hypertrophy)     elevated PSA Dr. Lindell Noe Bx 2010  . RBBB   . Cardiac arrest     a. out-of-hospital arrest 10/2013 - EF 40-45%, patent grafts on cath, received [employer] AICD.  [name] Christensen Ventricular bigeminy     a. Event monitor 01/2013: NSR with PVCs and occ bigeminy.  . Elevated LFTs     a. 10/2012 felt due to cardiac arrest - Hepatitis C Ab reactive from 11/15/2012>>Hep C RNA PCR negative 10/29/2012.   . Carotid disease, bilateral     a. 0-39% by doppers.    Past Surgical History  Procedure Laterality Date  . Cardiac catheterization  2007    with patent graft anatomy atretic left internal mammary  artery to the LAD which is nonobstructive. Will restart study June 08, 2007  . Coronary artery bypass graft  2005  . Lumbar fusion  09/2007  . Cardiac defibrillator placement  dec 2013    Dr. Graciela Christensen, [facility]  . Appendectomy      Current Outpatient Prescriptions   Medication Sig Dispense Refill  . acetaminophen (TYLENOL) 325 MG tablet Take 325-650 mg by mouth every 4 (four) hours as needed. For pain      . alfuzosin (UROXATRAL) 10 MG 24 hr tablet Take 10 mg by mouth every evening.       [name] Christensen aspirin 81 MG chewable tablet Chew 1 tablet (81 mg total) by mouth daily.      [name] Christensen atorvastatin (LIPITOR) 40 MG tablet Take 0.5 tablets (20 mg total) by mouth daily.  90 tablet  3  . carvedilol (COREG) 3.125 MG tablet Take 1 tablet (3.125 mg total) by mouth 2 (two) times daily with a meal.  60 tablet  11  . Cholecalciferol (VITAMIN D3) 1000 UNITS CAPS Take 1 capsule by mouth daily.        . clopidogrel (PLAVIX) 75 MG tablet Take 1 tablet (75 mg total) by mouth daily.  30 tablet  5  . Multiple Vitamin (MULTIVITAMIN WITH MINERALS) TABS Take 1 tablet by mouth daily.      . nitroGLYCERIN (NITROSTAT) 0.4 MG SL tablet Place 0.4 mg under the tongue every 5 (five) minutes as needed. For chest pain      . ranitidine (ZANTAC) 150 MG capsule Take 150 mg by mouth 2 (two) times daily as needed for heartburn.       No current  facility-administered medications for this visit.    Allergies  Allergen Reactions  . Ezetimibe     REACTION: numbness  . Niacin     REACTION: CP  . Penicillins Other (See Comments)    Blisters between fingers @ 15  . Pravastatin Sodium Other (See Comments)    Aches and pains in joints  . Rosuvastatin     REACTION: aches    Review of Systems negative except from HPI and PMH  Physical Exam BP 110/68  Pulse 66  Ht 5\' 8"  (1.727 m)  Wt 156 lb 12.8 oz (71.124 kg)  BMI 23.85 kg/m2 Well developed and well nourished in no acute distress HENT normal E scleral and icterus clear Neck Supple JVP flat; carotids brisk and full Clear to ausculation  Regular rate and rhythm, no murmurs gallops or rub Soft with active bowel sounds No clubbing cyanosis none Edema Alert and oriented, grossly normal motor and sensory function Skin Warm and  Dry    Assessment and  Plan

## 2013-04-08 NOTE — Assessment & Plan Note (Signed)
The patient has had symptomatic bigeminy. The ECG suggests both atrial and as well as possibly ventricular ectopics. We have discussed the role of antiarrhythmic drug suppression, overdrive pacing from his ventricular pacing site or just following for now  For right now we have elected to do the latter. He did not like the carvedilol so we'll go back on his atenolol. We will decrease the dose from 100--50 in hopes that some incremental increase in heart rate might provide some overdrive suppression.

## 2013-04-09 ENCOUNTER — Encounter (HOSPITAL_COMMUNITY): Payer: Medicare Other

## 2013-04-12 ENCOUNTER — Encounter (HOSPITAL_COMMUNITY): Payer: Medicare Other

## 2013-04-12 DIAGNOSIS — R972 Elevated prostate specific antigen [PSA]: Secondary | ICD-10-CM | POA: Diagnosis not present

## 2013-04-12 DIAGNOSIS — N400 Enlarged prostate without lower urinary tract symptoms: Secondary | ICD-10-CM | POA: Diagnosis not present

## 2013-04-14 ENCOUNTER — Encounter (HOSPITAL_COMMUNITY): Payer: Medicare Other

## 2013-04-16 ENCOUNTER — Encounter: Payer: Self-pay | Admitting: Internal Medicine

## 2013-04-16 ENCOUNTER — Encounter (HOSPITAL_COMMUNITY): Payer: Medicare Other

## 2013-04-16 ENCOUNTER — Ambulatory Visit (INDEPENDENT_AMBULATORY_CARE_PROVIDER_SITE_OTHER): Payer: Medicare Other | Admitting: Internal Medicine

## 2013-04-16 VITALS — BP 118/60 | HR 60 | Temp 98.0°F | Resp 16 | Wt 156.0 lb

## 2013-04-16 DIAGNOSIS — E785 Hyperlipidemia, unspecified: Secondary | ICD-10-CM | POA: Diagnosis not present

## 2013-04-16 DIAGNOSIS — I2510 Atherosclerotic heart disease of native coronary artery without angina pectoris: Secondary | ICD-10-CM

## 2013-04-16 DIAGNOSIS — K219 Gastro-esophageal reflux disease without esophagitis: Secondary | ICD-10-CM

## 2013-04-16 DIAGNOSIS — I10 Essential (primary) hypertension: Secondary | ICD-10-CM | POA: Diagnosis not present

## 2013-04-16 DIAGNOSIS — I498 Other specified cardiac arrhythmias: Secondary | ICD-10-CM | POA: Diagnosis not present

## 2013-04-16 DIAGNOSIS — N400 Enlarged prostate without lower urinary tract symptoms: Secondary | ICD-10-CM

## 2013-04-16 DIAGNOSIS — Z951 Presence of aortocoronary bypass graft: Secondary | ICD-10-CM

## 2013-04-16 NOTE — Assessment & Plan Note (Signed)
Continue with current prescription therapy as reflected on the Med list.  

## 2013-04-16 NOTE — Progress Notes (Signed)
Subjective:   HPI F/u hosp adm for a slow HR episode F/u spastic cough w/deep breaths - resolved.  Kathie Rhodes - wife - [phone]  Wt Readings from Last 3 Encounters:  04/16/13 156 lb (70.761 kg)  04/19/2013 156 lb 12.8 oz (71.124 kg)  02/13/2013 151 lb (68.493 kg)   BP Readings from Last 3 Encounters:  04/16/13 118/60  04/01/2013 110/68  04/07/13 149/73     s/p cardiac arrest on 10/30/2012   The patient presents for a follow-up of  chronic BPH, hypertension, chronic dyslipidemia,CAD controlled with medicines  Review of Systems  Constitutional: Negative for appetite change, fatigue and unexpected weight change.  HENT: Negative for nosebleeds, congestion, sore throat, sneezing, trouble swallowing and neck pain.   Eyes: Negative for itching and visual disturbance.  Respiratory: Negative for cough.   Cardiovascular: Negative for chest pain, palpitations and leg swelling.  Gastrointestinal: Negative for nausea, diarrhea, blood in stool and abdominal distention.  Genitourinary: Negative for frequency and hematuria.  Musculoskeletal: Negative for back pain, joint swelling and gait problem.  Skin: Negative for rash.  Neurological: Negative for dizziness, tremors, speech difficulty and weakness.  Psychiatric/Behavioral: Negative for sleep disturbance, dysphoric mood and agitation. The patient is not nervous/anxious.   coughing      Objective:   Physical Exam  Constitutional: He is oriented to person, place, and time. He appears well-developed and well-nourished. No distress.  HENT:  Head: Normocephalic and atraumatic.  Right Ear: External ear normal.  Left Ear: External ear normal.  Nose: Nose normal.  Mouth/Throat: Oropharynx is clear and moist. No oropharyngeal exudate.  Eyes: Conjunctivae and EOM are normal. Pupils are equal, round, and reactive to light. Right eye exhibits no discharge. Left eye exhibits no discharge. No scleral icterus.  Neck: Normal range of motion. Neck  supple. No JVD present. No tracheal deviation present. No thyromegaly present.  Cardiovascular: Normal rate, regular rhythm, normal heart sounds and intact distal pulses.  Exam reveals no gallop and no friction rub.   No murmur heard. Pulmonary/Chest: Effort normal and breath sounds normal. No stridor. No respiratory distress. He has no wheezes. He has no rales. He exhibits no tenderness.  Abdominal: Soft. Bowel sounds are normal. He exhibits no distension and no mass. There is no tenderness. There is no rebound and no guarding.  Genitourinary: Rectum normal, prostate normal and penis normal. Guaiac negative stool. No penile tenderness.  Musculoskeletal: Normal range of motion. He exhibits no edema and no tenderness.  Lymphadenopathy:    He has no cervical adenopathy.  Neurological: He is alert and oriented to person, place, and time. He has normal reflexes. No cranial nerve deficit. He exhibits normal muscle tone. Coordination normal.  Skin: Skin is warm and dry. No rash noted. He is not diaphoretic. No erythema. No pallor.  Psychiatric: He has a normal mood and affect. His behavior is normal. Judgment and thought content normal.    Lab Results  Component Value Date   WBC 9.8 04/06/2013   HGB 12.6* 04/06/2013   HCT 36.8* 04/06/2013   PLT 143* 04/06/2013   GLUCOSE 97 04/06/2013   CHOL 177 01/14/2013   TRIG 129.0 01/13/2013   HDL 38.30* 01/22/2013   LDLCALC 113* 01/03/2013   ALT 11 12/27/2012   AST 20 01/16/2013   NA 139 04/06/2013   K 4.1 04/06/2013   CL 105 04/06/2013   CREATININE 1.09 04/06/2013   BUN 15 04/06/2013   CO2 29 01/20/2013   TSH 2.793 04/06/2013  PSA 7.17* 12/29/2012   INR 1.32 10/27/2012   HGBA1C 6.3* 11/14/2012       Assessment & Plan:

## 2013-04-16 NOTE — Assessment & Plan Note (Signed)
Seeing a Urologist Chronic  Continue with current prescription therapy as reflected on the Med list.

## 2013-04-16 NOTE — Assessment & Plan Note (Signed)
Doing well 

## 2013-04-16 NOTE — Assessment & Plan Note (Signed)
S/p pacemaker/defib implant s/p cardiac arrest 2013

## 2013-04-19 ENCOUNTER — Encounter (HOSPITAL_COMMUNITY): Payer: Medicare Other

## 2013-04-21 ENCOUNTER — Encounter (HOSPITAL_COMMUNITY): Payer: Medicare Other

## 2013-04-23 ENCOUNTER — Encounter (HOSPITAL_COMMUNITY): Payer: Medicare Other

## 2013-04-25 DIAGNOSIS — 419620001 Death: Secondary | SNOMED CT

## 2013-04-25 DEATH — deceased

## 2013-04-26 ENCOUNTER — Encounter (HOSPITAL_COMMUNITY): Payer: Medicare Other

## 2013-04-28 ENCOUNTER — Encounter (HOSPITAL_COMMUNITY): Payer: Medicare Other

## 2013-04-30 ENCOUNTER — Encounter (HOSPITAL_COMMUNITY): Payer: Medicare Other

## 2013-05-03 ENCOUNTER — Encounter (HOSPITAL_COMMUNITY): Payer: Medicare Other

## 2013-05-05 ENCOUNTER — Encounter (HOSPITAL_COMMUNITY): Payer: Medicare Other

## 2013-05-06 LAB — ICD DEVICE OBSERVATION
BRDY-0002RV: 40 {beats}/min
CHARGE TIME: 7.1 s
DEV-0020ICD: NEGATIVE
DEVICE MODEL ICD: 7048849
FVT: 0
HV IMPEDENCE: 62 Ohm
PACEART VT: 0
RV LEAD AMPLITUDE: 5.6 mv
RV LEAD IMPEDENCE ICD: 337.5 Ohm
RV LEAD THRESHOLD: 1.25 V
TOT-0007: 0
TOT-0008: 0
TOT-0009: 0
TOT-0010: 0
TZAT-0001SLOWVT: 1
TZAT-0004SLOWVT: 8
TZAT-0012SLOWVT: 200 ms
TZAT-0013SLOWVT: 3
TZAT-0018SLOWVT: NEGATIVE
TZAT-0019SLOWVT: 7.5 V
TZAT-0020SLOWVT: 1 ms
TZON-0003SLOWVT: 300 ms
TZON-0004SLOWVT: 30
TZON-0005SLOWVT: 6
TZON-0010SLOWVT: 40 ms
TZST-0001SLOWVT: 2
TZST-0001SLOWVT: 3
TZST-0001SLOWVT: 4
TZST-0001SLOWVT: 5
TZST-0003SLOWVT: 30 J
TZST-0003SLOWVT: 36 J
TZST-0003SLOWVT: 36 J
TZST-0003SLOWVT: 36 J
VENTRICULAR PACING ICD: 0.05 pct
VF: 0

## 2013-05-07 ENCOUNTER — Encounter (HOSPITAL_COMMUNITY): Payer: Medicare Other

## 2013-05-10 ENCOUNTER — Encounter (HOSPITAL_COMMUNITY): Payer: Medicare Other

## 2013-05-12 ENCOUNTER — Encounter (HOSPITAL_COMMUNITY): Payer: Medicare Other

## 2013-05-14 ENCOUNTER — Encounter (HOSPITAL_COMMUNITY): Payer: Medicare Other

## 2013-05-17 ENCOUNTER — Encounter (HOSPITAL_COMMUNITY): Payer: Medicare Other

## 2013-05-18 ENCOUNTER — Encounter: Payer: Medicare Other | Admitting: Cardiovascular Disease

## 2013-05-19 ENCOUNTER — Encounter (HOSPITAL_COMMUNITY): Payer: Medicare Other

## 2013-05-21 ENCOUNTER — Encounter (HOSPITAL_COMMUNITY): Payer: Medicare Other

## 2013-05-24 ENCOUNTER — Encounter (HOSPITAL_COMMUNITY): Payer: Medicare Other

## 2013-05-25 ENCOUNTER — Encounter: Payer: Medicare Other | Admitting: Cardiovascular Disease

## 2013-05-26 ENCOUNTER — Encounter (HOSPITAL_COMMUNITY): Payer: Medicare Other

## 2013-05-31 ENCOUNTER — Encounter (HOSPITAL_COMMUNITY): Payer: Medicare Other

## 2013-06-02 ENCOUNTER — Encounter (HOSPITAL_COMMUNITY): Payer: Medicare Other

## 2013-06-04 ENCOUNTER — Encounter (HOSPITAL_COMMUNITY): Payer: Medicare Other

## 2013-06-07 ENCOUNTER — Encounter (HOSPITAL_COMMUNITY): Payer: Medicare Other

## 2013-06-09 ENCOUNTER — Encounter (HOSPITAL_COMMUNITY): Payer: Medicare Other

## 2013-06-09 ENCOUNTER — Telehealth: Payer: Self-pay | Admitting: *Deleted

## 2013-06-09 NOTE — Telephone Encounter (Signed)
OK to fill this prescription with additional refills x1 Thank you!  

## 2013-06-09 NOTE — Telephone Encounter (Signed)
Rf req for diazepam 5 mg 1/2-1 q 12 hr prn. # 30. Ok to Rf?

## 2013-06-10 MED ORDER — DIAZEPAM 5 MG PO TABS: 2.5000 mg | ORAL_TABLET | Freq: Two times a day (BID) | ORAL | Status: DC | PRN

## 2013-06-10 NOTE — Telephone Encounter (Signed)
Done

## 2013-06-11 ENCOUNTER — Encounter (HOSPITAL_COMMUNITY): Payer: Medicare Other

## 2013-06-14 ENCOUNTER — Encounter (HOSPITAL_COMMUNITY): Payer: Medicare Other

## 2013-06-16 ENCOUNTER — Encounter (HOSPITAL_COMMUNITY): Payer: Medicare Other

## 2013-06-18 ENCOUNTER — Encounter (HOSPITAL_COMMUNITY): Payer: Medicare Other

## 2013-06-21 ENCOUNTER — Encounter (HOSPITAL_COMMUNITY): Payer: Medicare Other

## 2013-06-23 ENCOUNTER — Encounter (HOSPITAL_COMMUNITY): Payer: Medicare Other

## 2013-06-25 ENCOUNTER — Encounter (HOSPITAL_COMMUNITY): Payer: Medicare Other

## 2013-06-28 ENCOUNTER — Encounter (HOSPITAL_COMMUNITY): Payer: Medicare Other

## 2013-06-30 ENCOUNTER — Encounter (HOSPITAL_COMMUNITY): Payer: Medicare Other

## 2013-07-02 ENCOUNTER — Other Ambulatory Visit: Payer: Self-pay | Admitting: *Deleted

## 2013-07-02 ENCOUNTER — Encounter (HOSPITAL_COMMUNITY): Payer: Medicare Other

## 2013-07-02 MED ORDER — CLOPIDOGREL BISULFATE 75 MG PO TABS: 75.0000 mg | ORAL_TABLET | Freq: Every day | ORAL | Status: DC

## 2013-07-05 ENCOUNTER — Encounter (HOSPITAL_COMMUNITY): Payer: Medicare Other

## 2013-07-05 DIAGNOSIS — L538 Other specified erythematous conditions: Secondary | ICD-10-CM | POA: Diagnosis not present

## 2013-07-05 DIAGNOSIS — R21 Rash and other nonspecific skin eruption: Secondary | ICD-10-CM | POA: Diagnosis not present

## 2013-07-05 DIAGNOSIS — N476 Balanoposthitis: Secondary | ICD-10-CM | POA: Diagnosis not present

## 2013-07-06 ENCOUNTER — Ambulatory Visit (INDEPENDENT_AMBULATORY_CARE_PROVIDER_SITE_OTHER): Payer: Medicare Other | Admitting: Cardiovascular Disease

## 2013-07-06 ENCOUNTER — Encounter: Payer: Self-pay | Admitting: Cardiovascular Disease

## 2013-07-06 VITALS — BP 122/70 | HR 66 | Wt 157.0 lb

## 2013-07-06 DIAGNOSIS — I498 Other specified cardiac arrhythmias: Secondary | ICD-10-CM

## 2013-07-06 DIAGNOSIS — I2510 Atherosclerotic heart disease of native coronary artery without angina pectoris: Secondary | ICD-10-CM

## 2013-07-06 DIAGNOSIS — E785 Hyperlipidemia, unspecified: Secondary | ICD-10-CM

## 2013-07-06 DIAGNOSIS — I469 Cardiac arrest, cause unspecified: Secondary | ICD-10-CM

## 2013-07-06 NOTE — Assessment & Plan Note (Signed)
Normal AICD function F/U Dr Graciela Husbands in December

## 2013-07-06 NOTE — Progress Notes (Signed)
Patient ID: [name] Christensen, male   DOB: February 08, 1932, 79 y.o.   MRN: [id number] [name] Christensen is a 79 y.o. male  Seen following discharge from the hospital in May  because of the admission for chest pain and bradycardia for which he was found to have bigeminy. He had noted that he had had fatigue for the antecedent 6-8 weeks.  Dr. Crissie Sickles change his atenolol to carvedilol which has been associated with dizziness and headache.  He has a history of ischemic heart disease with prior bypass surgery. His cardiac enzymes were negative. He is status post ICD implantation-single chamber undertaken for aborted cardiac arrest. Catheterization 12/13 demonstrating patent grafts with an atretic LIMA; LV function was normal  ROS: Denies fever, malais, weight loss, blurry vision, decreased visual acuity, cough, sputum, SOB, hemoptysis, pleuritic pain, palpitaitons, heartburn, abdominal pain, melena, lower extremity edema, claudication, or rash.  All other systems reviewed and negative  General: Affect appropriate Healthy:  appears stated age HEENT: normal Neck supple with no adenopathy JVP normal no bruits no thyromegaly Lungs clear with no wheezing and good diaphragmatic motion Heart:  S1/S2 no murmur, no rub, gallop or click PMI normal Abdomen: benighn, BS positve, no tenderness, no AAA no bruit.  No HSM or HJR Distal pulses intact with no bruits No edema Neuro non-focal Skin warm and dry No muscular weakness   Current Outpatient Prescriptions  Medication Sig Dispense Refill  . acetaminophen (TYLENOL) 325 MG tablet Take 325-650 mg by mouth every 4 (four) hours as needed. For pain      . alfuzosin (UROXATRAL) 10 MG 24 hr tablet Take 10 mg by mouth every evening.       [name] aspirin 81 MG chewable tablet Chew 1 tablet (81 mg total) by mouth daily.      [name] atorvastatin (LIPITOR) 40 MG tablet Take 0.5 tablets (20 mg total) by mouth daily.  90 tablet  3  . Cholecalciferol (VITAMIN D3) 1000 UNITS CAPS Take 1  capsule by mouth daily.        . clopidogrel (PLAVIX) 75 MG tablet Take 1 tablet (75 mg total) by mouth daily.  30 tablet  5  . diazepam (VALIUM) 5 MG tablet Take 0.5-1 tablets (2.5-5 mg total) by mouth every 12 (twelve) hours as needed.  30 tablet  1  . Multiple Vitamin (MULTIVITAMIN WITH MINERALS) TABS Take 1 tablet by mouth daily.      . nitroGLYCERIN (NITROSTAT) 0.4 MG SL tablet Place 0.4 mg under the tongue every 5 (five) minutes as needed. For chest pain      . ranitidine (ZANTAC) 150 MG capsule Take 150 mg by mouth as needed for heartburn.       [name] atenolol (TENORMIN) 50 MG tablet Take one tablet by mouth at night       No current facility-administered medications for this visit.    Allergies  Ezetimibe; Niacin; Penicillins; Pravastatin sodium; and Rosuvastatin  Electrocardiogram:  NSR rate 76 PAC  RBBB PR 258  Assessment and Plan

## 2013-07-06 NOTE — Patient Instructions (Signed)
Your physician wants you to follow-up in: YEAR WITH DR NISHAN  You will receive a reminder letter in the mail two months in advance. If you don't receive a letter, please call our office to schedule the follow-up appointment.  Your physician recommends that you continue on your current medications as directed. Please refer to the Current Medication list given to you today. 

## 2013-07-06 NOTE — Assessment & Plan Note (Signed)
Stable with no angina and good activity level.  Continue medical Rx  

## 2013-07-06 NOTE — Assessment & Plan Note (Signed)
Improved on atenolol Intolerant to coreg  Stable

## 2013-07-06 NOTE — Assessment & Plan Note (Signed)
Cholesterol is at goal.  Continue current dose of statin and diet Rx.  No myalgias or side effects.  F/U  LFT's in 6 months. Lab Results  Component Value Date   LDLCALC 113* 01/11/2013

## 2013-07-07 ENCOUNTER — Encounter (HOSPITAL_COMMUNITY): Payer: Medicare Other

## 2013-07-09 ENCOUNTER — Encounter (HOSPITAL_COMMUNITY): Payer: Medicare Other

## 2013-07-12 ENCOUNTER — Ambulatory Visit (INDEPENDENT_AMBULATORY_CARE_PROVIDER_SITE_OTHER): Payer: Medicare Other | Admitting: *Deleted

## 2013-07-12 ENCOUNTER — Encounter (HOSPITAL_COMMUNITY): Payer: Medicare Other

## 2013-07-12 DIAGNOSIS — I469 Cardiac arrest, cause unspecified: Secondary | ICD-10-CM

## 2013-07-12 DIAGNOSIS — Z95810 Presence of automatic (implantable) cardiac defibrillator: Secondary | ICD-10-CM

## 2013-07-13 LAB — REMOTE ICD DEVICE
BRDY-0002RV: 40 {beats}/min
DEV-0020ICD: NEGATIVE
DEVICE MODEL ICD: 7048849
HV IMPEDENCE: 72 Ohm
RV LEAD AMPLITUDE: 5.9 mv
RV LEAD IMPEDENCE ICD: 350 Ohm
TZAT-0001SLOWVT: 1
TZAT-0004SLOWVT: 8
TZAT-0012SLOWVT: 200 ms
TZAT-0013SLOWVT: 3
TZAT-0018SLOWVT: NEGATIVE
TZAT-0019SLOWVT: 7.5 V
TZAT-0020SLOWVT: 1 ms
TZON-0003SLOWVT: 300 ms
TZON-0004SLOWVT: 30
TZON-0005SLOWVT: 6
TZON-0010SLOWVT: 40 ms
TZST-0001SLOWVT: 2
TZST-0001SLOWVT: 3
TZST-0001SLOWVT: 4
TZST-0001SLOWVT: 5
TZST-0003SLOWVT: 30 J
TZST-0003SLOWVT: 36 J
TZST-0003SLOWVT: 36 J
TZST-0003SLOWVT: 36 J
VENTRICULAR PACING ICD: 1 pct

## 2013-07-14 ENCOUNTER — Encounter (HOSPITAL_COMMUNITY): Payer: Medicare Other

## 2013-07-16 ENCOUNTER — Encounter (HOSPITAL_COMMUNITY): Payer: Medicare Other

## 2013-07-19 ENCOUNTER — Ambulatory Visit (INDEPENDENT_AMBULATORY_CARE_PROVIDER_SITE_OTHER): Payer: Medicare Other | Admitting: Internal Medicine

## 2013-07-19 ENCOUNTER — Encounter: Payer: Self-pay | Admitting: Internal Medicine

## 2013-07-19 ENCOUNTER — Ambulatory Visit (INDEPENDENT_AMBULATORY_CARE_PROVIDER_SITE_OTHER)
Admission: RE | Admit: 2013-07-19 | Discharge: 2013-07-19 | Disposition: A | Discharge status: Home / Self Care | Payer: Medicare Other | Source: Ambulatory Visit | Attending: Internal Medicine | Admitting: Internal Medicine

## 2013-07-19 ENCOUNTER — Encounter (HOSPITAL_COMMUNITY): Payer: Medicare Other

## 2013-07-19 VITALS — BP 120/70 | HR 57 | Temp 97.4°F | Ht 68.0 in | Wt 158.0 lb

## 2013-07-19 DIAGNOSIS — R918 Other nonspecific abnormal finding of lung field: Secondary | ICD-10-CM | POA: Insufficient documentation

## 2013-07-19 DIAGNOSIS — R079 Chest pain, unspecified: Secondary | ICD-10-CM | POA: Diagnosis not present

## 2013-07-19 DIAGNOSIS — J984 Other disorders of lung: Secondary | ICD-10-CM | POA: Diagnosis not present

## 2013-07-19 NOTE — Progress Notes (Signed)
Subjective:    Patient ID: [name] Christensen, male    DOB: 1932-06-21  MRN: [id number]   Brief patient profile:  65 yowm never smoker s/p cardiac arrest Oct 31 2012 requiring artic sun and ever since Dec 17 placement of debrillator every deep makes him cough> eval with ? pna 1/20  > rx levaquin some better  referred 12/16/2012 to pulmonary clinic by Dr Posey Rea.   HPI 12/16/2012 1st pulmonary eval cc intermittent chills, severe cough since Dec 17 when takes breath in started levquin 1/20with ? pna and starting to feel better, sob only with exertion, no excess mucus production. rec Take zantac (ranitidine) 150 mg after bfast and after supper whether you think you need it or not for now GERD  For cough use tramadol 50 mg one every 4 hours if needed    12/31/2012 f/u ov/Wert cc overall much better, min grey mucus mostly after stirring, some chills, finished levaquin 2/2  rec Don't stop the zantac until 100% better for at least a week without the need for any cough medication  01/28/2013 f/u ov/Wert cc completely back to baseline, no cough or sob, not taking any cough meds for a week, still on the zantac at hs. rec Don't stop the zantac until 100% better for at least a week without the need for any cough medication.   07/19/2013  ov/Wert acute eval  Chief Complaint  Patient presents with  . Acute Visit    Pt states recently had some pain under right breast- bothered him for 2-3 wks, but has resolved over the past 3-4 days. He reports has some occ chest discomfort when he exercises, but breathing does not limit him.   on avg takes zantac 4 x in a week randomly, did not restart at onset of pain. No true pleuritic or ex component.   Fm has noted cough ? When  > not worse with exercise.   No obvious daytime variabilty or assoc  chest tightness, subjective wheeze overt sinus or hb symptoms. No unusual exp hx or h/o childhood pna/ asthma or premature birth to his knowledge.   Sleeping ok without  nocturnal  or early am exacerbation  of respiratory  c/o's or need for noct saba. Also denies any obvious fluctuation of symptoms with weather or environmental changes or other aggravating or alleviating factors except as outlined above   Current Medications, Allergies, Past Medical History, Past Surgical History, Family History, and Social History were reviewed in [employer] record.  ROS  The following are not active complaints unless bolded sore throat, dysphagia, dental problems, itching, sneezing,  nasal congestion or excess/ purulent secretions, ear ache,   fever, chills, sweats, unintended wt loss, pleuritic or exertional cp, hemoptysis,  orthopnea pnd or leg swelling, presyncope, palpitations, heartburn, abdominal pain, anorexia, nausea, vomiting, diarrhea  or change in bowel or urinary habits, change in stools or urine, dysuria,hematuria,  rash, arthralgias, visual complaints, headache, numbness weakness or ataxia or problems with walking or coordination,  change in mood/affect or memory.                   Objective:   Physical Exam  Wt 146 12/31/2012  > 01/28/2013 148 >  07/18/13  158  Wt Readings from Last 3 Encounters:  12/16/12 145 lb 3.2 oz (65.862 kg)  12/14/12 145 lb (65.772 kg)  12/06/2012 144 lb (65.318 kg)    HEENT: nl dentition, turbinates, and orophanx. Nl external ear canals without cough reflex  NECK :  without JVD/Nodes/TM/ nl carotid upstrokes bilaterally   LUNGS: no acc muscle use, clear to A and P bilaterally without cough on insp or exp maneuvers   CV:  RRR  no s3 or murmur or increase in P2, no edema   ABD:  soft and nontender with nl excursion in the supine position. No bruits or organomegaly, bowel sounds nl  MS:  warm without deformities, calf tenderness, cyanosis or clubbing  SKIN: warm and dry without lesions      CXR  07/19/2013 :    Mild hyperinflation. Stable chronic reticular interstitial scarring in the right mid  and right lower lung. No acute infiltrate or pulmonary edema.       Assessment & Plan:

## 2013-07-19 NOTE — Patient Instructions (Addendum)
Please remember to go to the  x-ray department downstairs for your tests - we will call you with the results when they are available.  Resume zantac 150 mg twice daily at very first cough, chest discomfort or shortness of breath  Let Dr [name] know if you start noticing chest discomfort with exertion.

## 2013-07-21 ENCOUNTER — Encounter (HOSPITAL_COMMUNITY): Payer: Medicare Other

## 2013-07-21 ENCOUNTER — Telehealth: Payer: Self-pay | Admitting: Internal Medicine

## 2013-07-21 DIAGNOSIS — R0789 Other chest pain: Secondary | ICD-10-CM | POA: Insufficient documentation

## 2013-07-21 HISTORY — DX: Other chest pain: R07.89

## 2013-07-21 NOTE — Assessment & Plan Note (Signed)
F/u cxr s change typical of a resolved ALI pattern > no further w/u anticipated

## 2013-07-21 NOTE — Assessment & Plan Note (Signed)
I had an extended discussion with the patient today lasting 15 to 20 minutes of a 25 minute visit on the following issues:  His present complaint resolved prior to ov and by hx had nothing to suggest it was either pleuritic or cardiac but he is def at risk of latter and needs to let Dr [name] know if recurs with exertion.  He also appears to have tendency to gerd so at the first sign of any chest symptoms should observe the dietary restrictions we talked about and resume h2 bid  Pulmonary f/u can be prn

## 2013-07-21 NOTE — Telephone Encounter (Signed)
I spoke with patient about results and he verbalized understanding and had no questions 

## 2013-07-23 ENCOUNTER — Encounter (HOSPITAL_COMMUNITY): Payer: Medicare Other

## 2013-07-26 ENCOUNTER — Encounter (HOSPITAL_COMMUNITY): Payer: Medicare Other

## 2013-07-26 DIAGNOSIS — 419620001 Death: Secondary | SNOMED CT | POA: Diagnosis not present

## 2013-07-26 DEATH — deceased

## 2013-07-28 ENCOUNTER — Encounter (HOSPITAL_COMMUNITY): Payer: Medicare Other

## 2013-07-30 ENCOUNTER — Encounter (HOSPITAL_COMMUNITY): Payer: Medicare Other

## 2013-08-02 ENCOUNTER — Encounter (HOSPITAL_COMMUNITY): Payer: Medicare Other

## 2013-08-04 ENCOUNTER — Encounter (HOSPITAL_COMMUNITY): Payer: Medicare Other

## 2013-08-06 ENCOUNTER — Encounter (HOSPITAL_COMMUNITY): Payer: Medicare Other

## 2013-08-09 ENCOUNTER — Encounter (HOSPITAL_COMMUNITY): Payer: Medicare Other

## 2013-08-11 ENCOUNTER — Encounter (HOSPITAL_COMMUNITY): Payer: Medicare Other

## 2013-08-13 ENCOUNTER — Encounter: Payer: Self-pay | Admitting: *Deleted

## 2013-08-13 ENCOUNTER — Encounter (HOSPITAL_COMMUNITY): Payer: Medicare Other

## 2013-08-16 ENCOUNTER — Encounter (HOSPITAL_COMMUNITY): Payer: Medicare Other

## 2013-08-18 ENCOUNTER — Encounter (HOSPITAL_COMMUNITY): Payer: Medicare Other

## 2013-08-20 ENCOUNTER — Encounter (HOSPITAL_COMMUNITY): Payer: Medicare Other

## 2013-08-23 ENCOUNTER — Encounter (HOSPITAL_COMMUNITY): Payer: Medicare Other

## 2013-08-24 ENCOUNTER — Other Ambulatory Visit: Payer: Self-pay | Admitting: *Deleted

## 2013-08-24 ENCOUNTER — Encounter: Payer: Self-pay | Admitting: Internal Medicine

## 2013-08-24 MED ORDER — NITROGLYCERIN 0.4 MG SL SUBL: 0.4000 mg | SUBLINGUAL_TABLET | SUBLINGUAL | Status: DC | PRN

## 2013-08-25 ENCOUNTER — Encounter (HOSPITAL_COMMUNITY): Payer: Medicare Other

## 2013-08-27 ENCOUNTER — Encounter (HOSPITAL_COMMUNITY): Payer: Medicare Other

## 2013-08-30 ENCOUNTER — Other Ambulatory Visit (INDEPENDENT_AMBULATORY_CARE_PROVIDER_SITE_OTHER): Payer: Medicare Other

## 2013-08-30 ENCOUNTER — Encounter: Payer: Self-pay | Admitting: Internal Medicine

## 2013-08-30 ENCOUNTER — Ambulatory Visit (INDEPENDENT_AMBULATORY_CARE_PROVIDER_SITE_OTHER): Payer: Medicare Other | Admitting: Internal Medicine

## 2013-08-30 ENCOUNTER — Encounter (HOSPITAL_COMMUNITY): Payer: Medicare Other

## 2013-08-30 VITALS — BP 168/64 | HR 64 | Temp 97.1°F | Resp 16 | Wt 155.0 lb

## 2013-08-30 DIAGNOSIS — M545 Low back pain, unspecified: Secondary | ICD-10-CM

## 2013-08-30 DIAGNOSIS — E785 Hyperlipidemia, unspecified: Secondary | ICD-10-CM | POA: Diagnosis not present

## 2013-08-30 DIAGNOSIS — Z23 Encounter for immunization: Secondary | ICD-10-CM

## 2013-08-30 LAB — HEPATIC FUNCTION PANEL
ALT: 16 U/L (ref 0–53)
AST: 25 U/L (ref 0–37)
Albumin: 3.9 g/dL (ref 3.5–5.2)
Alkaline Phosphatase: 71 U/L (ref 39–117)
Bilirubin, Direct: 0.1 mg/dL (ref 0.0–0.3)
Total Bilirubin: 0.5 mg/dL (ref 0.3–1.2)
Total Protein: 7.4 g/dL (ref 6.0–8.3)

## 2013-08-30 LAB — URINALYSIS
Bilirubin Urine: NEGATIVE
Ketones, ur: NEGATIVE
Leukocytes, UA: NEGATIVE
Nitrite: NEGATIVE
Specific Gravity, Urine: 1.01 (ref 1.000–1.030)
Total Protein, Urine: NEGATIVE
Urine Glucose: NEGATIVE
Urobilinogen, UA: 0.2 (ref 0.0–1.0)
pH: 6 (ref 5.0–8.0)

## 2013-08-30 LAB — CBC WITH DIFFERENTIAL/PLATELET
Basophils Absolute: 0.1 10*3/uL (ref 0.0–0.1)
Basophils Relative: 0.7 % (ref 0.0–3.0)
Eosinophils Absolute: 0.2 10*3/uL (ref 0.0–0.7)
Eosinophils Relative: 2.3 % (ref 0.0–5.0)
HCT: 38.3 % — ABNORMAL LOW (ref 39.0–52.0)
Hemoglobin: 13 g/dL (ref 13.0–17.0)
Lymphocytes Relative: 32.2 % (ref 12.0–46.0)
Lymphs Abs: 2.4 10*3/uL (ref 0.7–4.0)
MCHC: 34 g/dL (ref 30.0–36.0)
MCV: 89.2 fl (ref 78.0–100.0)
Monocytes Absolute: 0.5 10*3/uL (ref 0.1–1.0)
Monocytes Relative: 6.7 % (ref 3.0–12.0)
Neutro Abs: 4.4 10*3/uL (ref 1.4–7.7)
Neutrophils Relative %: 58.1 % (ref 43.0–77.0)
Platelets: 144 10*3/uL — ABNORMAL LOW (ref 150.0–400.0)
RBC: 4.29 Mil/uL (ref 4.22–5.81)
RDW: 13.3 % (ref 11.5–14.6)
WBC: 7.6 10*3/uL (ref 4.5–10.5)

## 2013-08-30 LAB — BASIC METABOLIC PANEL
BUN: 14 mg/dL (ref 6–23)
CO2: 31 mEq/L (ref 19–32)
Calcium: 10.2 mg/dL (ref 8.4–10.5)
Chloride: 99 mEq/L (ref 96–112)
Creatinine, Ser: 1.2 mg/dL (ref 0.4–1.5)
GFR: 64.57 mL/min (ref >60.00)
Glucose, Bld: 106 mg/dL — ABNORMAL HIGH (ref 70–99)
Potassium: 4.6 mEq/L (ref 3.5–5.1)
Sodium: 133 mEq/L — ABNORMAL LOW (ref 135–145)

## 2013-08-30 LAB — SEDIMENTATION RATE: Sed Rate: 21 mm/hr (ref 0–22)

## 2013-08-30 LAB — CK: Total CK: 110 U/L (ref 7–232)

## 2013-08-30 NOTE — Progress Notes (Signed)
Subjective:   HPI  C/o LBP B x 3 wks (similar to Lipitor induced pain in the past) worse w/walking Pt stopped Lipitor 3 wks ago - not better  (call Kathie Rhodes - wife - [phone])  Wt Readings from Last 3 Encounters:  08/30/13 155 lb (70.308 kg)  07/19/13 158 lb (71.668 kg)  06/29/2013 157 lb (71.215 kg)   BP Readings from Last 3 Encounters:  08/30/13 168/64  07/19/13 120/70  07/24/2013 122/70     Post-hosp f/u -- cardiac arrest on 11/19/2012  [name] Christensen is a 79 year old male with  history of coronary artery disease who was admitted on October 31, 2012,  after witnessed arrest at home, presumed VFib. He was started on [place] protocol at admission, and required intubation for acute respiratory  failure. Workup revealed severe hypokinesis of basal, mid, posterior,  anterior, lateral walls. He required treatment with IV antibiotics for  HCAP. He had decreased level of responsiveness. An EEG showed severe  slowing of cerebral activity, compatible with severe encephalopathy on  December 13. He tolerated extubation, and has been upgraded to D3 diet,  thin liquids. He had ICD placed on December 18th, by Dr. Graciela Husbands. Device  function normal. Therapies were initiated and patient is noted to be  deconditioned, limited by hypoxia, as well as noted to have issues with  delayed processing.     The patient presents for a follow-up of  chronic BPH, hypertension, chronic dyslipidemia,CAD controlled with medicines       Review of Systems  Constitutional: Negative for appetite change, fatigue and unexpected weight change.  HENT: Negative for nosebleeds, congestion, sore throat, sneezing, trouble swallowing and neck pain.   Eyes: Negative for itching and visual disturbance.  Respiratory: Negative for cough.   Cardiovascular: Negative for chest pain, palpitations and leg swelling.  Gastrointestinal: Negative for nausea, diarrhea, blood in stool and abdominal distention.   Genitourinary: Negative for frequency and hematuria.  Musculoskeletal: Negative for back pain, joint swelling and gait problem.  Skin: Negative for rash.  Neurological: Negative for dizziness, tremors, speech difficulty and weakness.  Psychiatric/Behavioral: Negative for sleep disturbance, dysphoric mood and agitation. The patient is not nervous/anxious.   LS - NT abd - sensitive in RLQ     Objective:   Physical Exam  Constitutional: He is oriented to person, place, and time. He appears well-developed and well-nourished. No distress.  HENT:  Head: Normocephalic and atraumatic.  Right Ear: External ear normal.  Left Ear: External ear normal.  Nose: Nose normal.  Mouth/Throat: Oropharynx is clear and moist. No oropharyngeal exudate.  Eyes: Conjunctivae and EOM are normal. Pupils are equal, round, and reactive to light. Right eye exhibits no discharge. Left eye exhibits no discharge. No scleral icterus.  Neck: Normal range of motion. Neck supple. No JVD present. No tracheal deviation present. No thyromegaly present.  Cardiovascular: Normal rate, regular rhythm, normal heart sounds and intact distal pulses.  Exam reveals no gallop and no friction rub.   No murmur heard. Pulmonary/Chest: Effort normal and breath sounds normal. No stridor. No respiratory distress. He has no wheezes. He has no rales. He exhibits no tenderness.  Abdominal: Soft. Bowel sounds are normal. He exhibits no distension and no mass. There is no tenderness. There is no rebound and no guarding.  Genitourinary: Rectum normal, prostate normal and penis normal. Guaiac negative stool. No penile tenderness.  Musculoskeletal: Normal range of motion. He exhibits no edema and no tenderness.  Lymphadenopathy:  He has no cervical adenopathy.  Neurological: He is alert and oriented to person, place, and time. He has normal reflexes. No cranial nerve deficit. He exhibits normal muscle tone. Coordination normal.  Skin: Skin is warm  and dry. No rash noted. He is not diaphoretic. No erythema. No pallor.  Psychiatric: He has a normal mood and affect. His behavior is normal. Judgment and thought content normal.  LS - NT abd - sensitive in RLQ   Lab Results  Component Value Date   WBC 9.8 04/06/2013   HGB 12.6* 04/06/2013   HCT 36.8* 04/06/2013   PLT 143* 04/06/2013   GLUCOSE 97 04/06/2013   CHOL 177 01/07/2013   TRIG 129.0 01/18/2013   HDL 38.30* 01/04/2013   LDLCALC 113* 01/14/2013   ALT 11 01/14/2013   AST 20 12/30/2012   NA 139 04/06/2013   K 4.1 04/06/2013   CL 105 04/06/2013   CREATININE 1.09 04/06/2013   BUN 15 04/06/2013   CO2 29 01/19/2013   TSH 2.793 04/06/2013   PSA 7.17* 01/19/2013   INR 1.32 11/14/2012   HGBA1C 6.3* 11/17/2012       Assessment & Plan:

## 2013-08-30 NOTE — Assessment & Plan Note (Signed)
Pt held Lipitor starting 3 wks ago

## 2013-08-30 NOTE — Assessment & Plan Note (Signed)
10/14he had the same issue before due to statin Off Lipitor Labs

## 2013-09-01 ENCOUNTER — Encounter (HOSPITAL_COMMUNITY): Payer: Medicare Other

## 2013-09-03 ENCOUNTER — Encounter (HOSPITAL_COMMUNITY): Payer: Medicare Other

## 2013-09-06 ENCOUNTER — Encounter (HOSPITAL_COMMUNITY): Payer: Medicare Other

## 2013-09-08 ENCOUNTER — Encounter (HOSPITAL_COMMUNITY): Payer: Medicare Other

## 2013-09-10 ENCOUNTER — Encounter (HOSPITAL_COMMUNITY): Payer: Medicare Other

## 2013-09-13 ENCOUNTER — Encounter (HOSPITAL_COMMUNITY): Payer: Medicare Other

## 2013-09-15 ENCOUNTER — Encounter (HOSPITAL_COMMUNITY): Payer: Medicare Other

## 2013-09-17 ENCOUNTER — Encounter (HOSPITAL_COMMUNITY): Payer: Medicare Other

## 2013-09-20 ENCOUNTER — Encounter (HOSPITAL_COMMUNITY): Payer: Medicare Other

## 2013-09-22 ENCOUNTER — Encounter (HOSPITAL_COMMUNITY): Payer: Medicare Other

## 2013-09-24 ENCOUNTER — Encounter (HOSPITAL_COMMUNITY): Payer: Medicare Other

## 2013-09-27 ENCOUNTER — Encounter (HOSPITAL_COMMUNITY): Payer: Medicare Other

## 2013-09-29 ENCOUNTER — Encounter (HOSPITAL_COMMUNITY): Payer: Medicare Other

## 2013-10-01 ENCOUNTER — Encounter (HOSPITAL_COMMUNITY): Payer: Medicare Other

## 2013-10-02 DIAGNOSIS — I10 Essential (primary) hypertension: Secondary | ICD-10-CM | POA: Diagnosis not present

## 2013-10-04 ENCOUNTER — Encounter (HOSPITAL_COMMUNITY): Payer: Medicare Other

## 2013-10-05 ENCOUNTER — Encounter: Payer: Medicare Other | Admitting: Internal Medicine

## 2013-10-06 ENCOUNTER — Encounter (HOSPITAL_COMMUNITY): Payer: Medicare Other

## 2013-10-08 ENCOUNTER — Encounter (HOSPITAL_COMMUNITY): Payer: Medicare Other

## 2013-10-11 ENCOUNTER — Encounter (HOSPITAL_COMMUNITY): Payer: Medicare Other

## 2013-10-12 ENCOUNTER — Ambulatory Visit (INDEPENDENT_AMBULATORY_CARE_PROVIDER_SITE_OTHER): Payer: Medicare Other | Admitting: Internal Medicine

## 2013-10-12 ENCOUNTER — Encounter: Payer: Self-pay | Admitting: Internal Medicine

## 2013-10-12 VITALS — BP 138/90 | HR 72 | Temp 96.6°F | Ht 68.0 in | Wt 158.5 lb

## 2013-10-12 DIAGNOSIS — J019 Acute sinusitis, unspecified: Secondary | ICD-10-CM | POA: Diagnosis not present

## 2013-10-12 DIAGNOSIS — I10 Essential (primary) hypertension: Secondary | ICD-10-CM | POA: Diagnosis not present

## 2013-10-12 MED ORDER — ATENOLOL 50 MG PO TABS: 25.0000 mg | ORAL_TABLET | Freq: Two times a day (BID) | ORAL | Status: DC

## 2013-10-12 MED ORDER — CEFUROXIME AXETIL 500 MG PO TABS: 500.0000 mg | ORAL_TABLET | Freq: Two times a day (BID) | ORAL | Status: DC

## 2013-10-12 MED ORDER — LOSARTAN POTASSIUM 50 MG PO TABS: 25.0000 mg | ORAL_TABLET | Freq: Two times a day (BID) | ORAL | Status: DC

## 2013-10-12 NOTE — Progress Notes (Signed)
01/22/2013   INR 1.32 11/24/2012   HGBA1C 6.3* 10/30/2012       Assessment & Plan:  Subjective:   HPI   C/o low and high BP lately: SBP=116-178  More high then low Kathie Rhodes - wife - [phone]  Wt Readings from Last 3 Encounters:  10/12/13 158 lb 8 oz (71.895 kg)  08/30/13 155 lb (70.308 kg)  07/19/13 158 lb (71.668 kg)   BP Readings from Last 3 Encounters:  10/12/13 138/90  08/30/13 168/64  07/19/13 120/70     s/p cardiac arrest on 11/18/2012   The patient presents for a follow-up of  chronic BPH, hypertension, chronic dyslipidemia,CAD controlled with medicines  Review of Systems  Constitutional: Negative for appetite change, fatigue and unexpected weight change.  HENT: Negative for congestion, nosebleeds, sneezing, sore throat and trouble swallowing.   Eyes: Negative for itching and visual disturbance.  Respiratory: Negative for cough.   Cardiovascular: Negative for chest pain, palpitations and leg swelling.  Gastrointestinal: Negative for nausea, diarrhea, blood in stool and abdominal distention.  Genitourinary: Negative for frequency and hematuria.  Musculoskeletal: Negative for back pain, gait problem, joint swelling and neck pain.  Skin: Negative for rash.  Neurological: Negative for dizziness, tremors, speech difficulty and weakness.  Psychiatric/Behavioral: Negative for sleep disturbance, dysphoric mood and agitation. The patient is not nervous/anxious.   coughing      Objective:   Physical Exam  Constitutional: He is oriented to person, place, and time. He appears well-developed and well-nourished. No distress.  HENT:  Head: Normocephalic and atraumatic.  Right Ear: External ear normal.  Left Ear: External ear normal.  Nose: Nose normal.  Mouth/Throat: Oropharynx is clear and moist. No oropharyngeal exudate.  Eyes: Conjunctivae and EOM are normal. Pupils are equal, round, and reactive to light. Right eye exhibits no discharge. Left eye exhibits no discharge. No scleral icterus.  Neck: Normal range of motion. Neck supple. No JVD present. No  tracheal deviation present. No thyromegaly present.  Cardiovascular: Normal rate, regular rhythm, normal heart sounds and intact distal pulses.  Exam reveals no gallop and no friction rub.   No murmur heard. Pulmonary/Chest: Effort normal and breath sounds normal. No stridor. No respiratory distress. He has no wheezes. He has no rales. He exhibits no tenderness.  Abdominal: Soft. Bowel sounds are normal. He exhibits no distension and no mass. There is no tenderness. There is no rebound and no guarding.  Genitourinary: Rectum normal, prostate normal and penis normal. Guaiac negative stool. No penile tenderness.  Musculoskeletal: Normal range of motion. He exhibits no edema and no tenderness.  Lymphadenopathy:    He has no cervical adenopathy.  Neurological: He is alert and oriented to person, place, and time. He has normal reflexes. No cranial nerve deficit. He exhibits normal muscle tone. Coordination normal.  Skin: Skin is warm and dry. No rash noted. He is not diaphoretic. No erythema. No pallor.  Psychiatric: He has a normal mood and affect. His behavior is normal. Judgment and thought content normal.    Lab Results  Component Value Date   WBC 7.6 08/30/2013   HGB 13.0 08/30/2013   HCT 38.3* 08/30/2013   PLT 144.0* 08/30/2013   GLUCOSE 106* 08/30/2013   CHOL 177 01/05/2013   TRIG 129.0 01/11/2013   HDL 38.30* 01/03/2013   LDLCALC 113* 01/17/2013   ALT 16 08/30/2013   AST 25 08/30/2013   NA 133* 08/30/2013   K 4.6 08/30/2013   CL 99 08/30/2013   CREATININE 1.2 08/30/2013   BUN 14 08/30/2013   CO2 31 08/30/2013   TSH 2.793 04/06/2013   PSA 7.17*

## 2013-10-12 NOTE — Progress Notes (Signed)
Pre visit review using our clinic review tool, if applicable. No additional management support is needed unless otherwise documented below in the visit note. 

## 2013-10-12 NOTE — Assessment & Plan Note (Signed)
11/14 labile  Start Atenolol and Losartan 1/2 bid each

## 2013-10-12 NOTE — Patient Instructions (Addendum)
If BP is high: take extra 25 mg of Losartan

## 2013-10-13 ENCOUNTER — Encounter (HOSPITAL_COMMUNITY): Payer: Medicare Other

## 2013-10-15 ENCOUNTER — Encounter (HOSPITAL_COMMUNITY): Payer: Medicare Other

## 2013-10-18 ENCOUNTER — Encounter (HOSPITAL_COMMUNITY): Payer: Medicare Other

## 2013-10-18 ENCOUNTER — Other Ambulatory Visit: Payer: Self-pay | Admitting: Internal Medicine

## 2013-10-18 ENCOUNTER — Ambulatory Visit (INDEPENDENT_AMBULATORY_CARE_PROVIDER_SITE_OTHER): Payer: Medicare Other | Admitting: *Deleted

## 2013-10-18 DIAGNOSIS — I469 Cardiac arrest, cause unspecified: Secondary | ICD-10-CM

## 2013-10-20 ENCOUNTER — Encounter (HOSPITAL_COMMUNITY): Payer: Medicare Other

## 2013-10-22 ENCOUNTER — Encounter (HOSPITAL_COMMUNITY): Payer: Medicare Other

## 2013-10-24 LAB — MDC_IDC_ENUM_SESS_TYPE_REMOTE
Battery Remaining Longevity: 90 mo
Battery Remaining Percentage: 89 %
Battery Voltage: 3.08 V
Brady Statistic RV Percent Paced: 1 %
Date Time Interrogation Session: 20141124070019
HighPow Impedance: 79 Ohm
HighPow Impedance: 79 Ohm
Implantable Pulse Generator Serial Number: 7048849
Lead Channel Impedance Value: 360 Ohm
Lead Channel Pacing Threshold Amplitude: 1.25 V
Lead Channel Pacing Threshold Pulse Width: 0.5 ms
Lead Channel Sensing Intrinsic Amplitude: 7.5 mV
Lead Channel Setting Pacing Amplitude: 2.5 V
Lead Channel Setting Pacing Pulse Width: 0.5 ms
Lead Channel Setting Sensing Sensitivity: 0.5 mV
Zone Setting Detection Interval: 250 ms
Zone Setting Detection Interval: 300 ms

## 2013-10-25 ENCOUNTER — Encounter (HOSPITAL_COMMUNITY): Payer: Medicare Other

## 2013-10-25 DIAGNOSIS — 419620001 Death: Secondary | SNOMED CT | POA: Diagnosis not present

## 2013-10-25 DEATH — deceased

## 2013-10-27 ENCOUNTER — Encounter (HOSPITAL_COMMUNITY): Payer: Medicare Other

## 2013-10-29 ENCOUNTER — Encounter (HOSPITAL_COMMUNITY): Payer: Medicare Other

## 2013-11-01 ENCOUNTER — Encounter (HOSPITAL_COMMUNITY): Payer: Medicare Other

## 2013-11-01 DIAGNOSIS — R972 Elevated prostate specific antigen [PSA]: Secondary | ICD-10-CM | POA: Diagnosis not present

## 2013-11-01 DIAGNOSIS — N400 Enlarged prostate without lower urinary tract symptoms: Secondary | ICD-10-CM | POA: Diagnosis not present

## 2013-11-02 ENCOUNTER — Encounter: Payer: Self-pay | Admitting: *Deleted

## 2013-11-03 ENCOUNTER — Encounter (HOSPITAL_COMMUNITY): Payer: Medicare Other

## 2013-11-05 ENCOUNTER — Encounter (HOSPITAL_COMMUNITY): Payer: Medicare Other

## 2013-11-08 ENCOUNTER — Encounter (HOSPITAL_COMMUNITY): Payer: Medicare Other

## 2013-11-08 ENCOUNTER — Encounter: Payer: Self-pay | Admitting: Internal Medicine

## 2013-11-10 ENCOUNTER — Encounter (HOSPITAL_COMMUNITY): Payer: Medicare Other

## 2013-11-12 ENCOUNTER — Encounter (HOSPITAL_COMMUNITY): Payer: Medicare Other

## 2013-11-15 ENCOUNTER — Encounter (HOSPITAL_COMMUNITY): Payer: Medicare Other

## 2013-11-17 ENCOUNTER — Encounter (HOSPITAL_COMMUNITY): Payer: Medicare Other

## 2013-11-19 ENCOUNTER — Encounter (HOSPITAL_COMMUNITY): Payer: Medicare Other

## 2013-11-22 ENCOUNTER — Encounter (HOSPITAL_COMMUNITY): Payer: Medicare Other

## 2013-11-24 ENCOUNTER — Encounter (HOSPITAL_COMMUNITY): Payer: Medicare Other

## 2013-11-26 ENCOUNTER — Encounter (HOSPITAL_COMMUNITY): Payer: Medicare Other

## 2013-11-29 ENCOUNTER — Encounter (HOSPITAL_COMMUNITY): Payer: Medicare Other

## 2013-12-01 ENCOUNTER — Encounter (HOSPITAL_COMMUNITY): Payer: Medicare Other

## 2013-12-03 ENCOUNTER — Encounter (HOSPITAL_COMMUNITY): Payer: Medicare Other

## 2013-12-06 ENCOUNTER — Encounter (HOSPITAL_COMMUNITY): Payer: Medicare Other

## 2013-12-08 ENCOUNTER — Encounter (HOSPITAL_COMMUNITY): Payer: Medicare Other

## 2013-12-10 ENCOUNTER — Encounter (HOSPITAL_COMMUNITY): Payer: Medicare Other

## 2013-12-13 ENCOUNTER — Encounter (HOSPITAL_COMMUNITY): Payer: Medicare Other

## 2013-12-15 ENCOUNTER — Encounter (HOSPITAL_COMMUNITY): Payer: Medicare Other

## 2013-12-17 ENCOUNTER — Encounter (HOSPITAL_COMMUNITY): Payer: Medicare Other

## 2013-12-20 ENCOUNTER — Encounter (HOSPITAL_COMMUNITY): Payer: Medicare Other

## 2013-12-22 ENCOUNTER — Ambulatory Visit (INDEPENDENT_AMBULATORY_CARE_PROVIDER_SITE_OTHER): Payer: Medicare Other | Admitting: Internal Medicine

## 2013-12-22 ENCOUNTER — Encounter (HOSPITAL_COMMUNITY): Payer: Medicare Other

## 2013-12-22 ENCOUNTER — Encounter: Payer: Self-pay | Admitting: Internal Medicine

## 2013-12-22 ENCOUNTER — Other Ambulatory Visit: Payer: Self-pay | Admitting: *Deleted

## 2013-12-22 VITALS — BP 153/70 | HR 65 | Ht 68.0 in | Wt 159.0 lb

## 2013-12-22 DIAGNOSIS — I259 Chronic ischemic heart disease, unspecified: Secondary | ICD-10-CM

## 2013-12-22 DIAGNOSIS — I498 Other specified cardiac arrhythmias: Secondary | ICD-10-CM

## 2013-12-22 DIAGNOSIS — R002 Palpitations: Secondary | ICD-10-CM

## 2013-12-22 DIAGNOSIS — I10 Essential (primary) hypertension: Secondary | ICD-10-CM

## 2013-12-22 DIAGNOSIS — I469 Cardiac arrest, cause unspecified: Secondary | ICD-10-CM

## 2013-12-22 DIAGNOSIS — I4949 Other premature depolarization: Secondary | ICD-10-CM

## 2013-12-22 DIAGNOSIS — I499 Cardiac arrhythmia, unspecified: Secondary | ICD-10-CM

## 2013-12-22 DIAGNOSIS — Z95810 Presence of automatic (implantable) cardiac defibrillator: Secondary | ICD-10-CM | POA: Diagnosis not present

## 2013-12-22 LAB — MDC_IDC_ENUM_SESS_TYPE_INCLINIC
Battery Remaining Longevity: 90 mo
Brady Statistic RV Percent Paced: 0.08 %
Date Time Interrogation Session: 20150128113350
HighPow Impedance: 73.125
Implantable Pulse Generator Serial Number: 7048849
Lead Channel Impedance Value: 350 Ohm
Lead Channel Pacing Threshold Amplitude: 1.25 V
Lead Channel Pacing Threshold Pulse Width: 0.8 ms
Lead Channel Sensing Intrinsic Amplitude: 4.5 mV
Lead Channel Setting Pacing Amplitude: 2.5 V
Lead Channel Setting Pacing Pulse Width: 0.8 ms
Lead Channel Setting Sensing Sensitivity: 0.5 mV
Zone Setting Detection Interval: 250 ms
Zone Setting Detection Interval: 300 ms

## 2013-12-22 NOTE — Patient Instructions (Signed)
Your physician recommends that you continue on your current medications as directed. Please refer to the Current Medication list given to you today.  Your physician has recommended that you wear a 48 holter monitor. Holter monitors are medical devices that record the heart's electrical activity. Doctors most often use these monitors to diagnose arrhythmias. Arrhythmias are problems with the speed or rhythm of the heartbeat. The monitor is a small, portable device. You can wear one while you do your normal daily activities. This is usually used to diagnose what is causing palpitations/syncope (passing out).  Your physician recommends that you schedule a follow-up appointment in: 3-4 weeks with Dr. [name] .

## 2013-12-22 NOTE — Assessment & Plan Note (Signed)
The patient has significant ventricular ectopy. We will quantitative by Holter monitoring and then assess left ventricular function to see if anything further needs to be done  Echo 2013 EF of 45%

## 2013-12-22 NOTE — Assessment & Plan Note (Signed)
No intercurrent ventricular arrhythmias 

## 2013-12-22 NOTE — Assessment & Plan Note (Signed)
Significantly elevated. But this appears to be anomalous compared to review of her old chart

## 2013-12-22 NOTE — Progress Notes (Signed)
Patient Care Team: Tresa Garter, MD as PCP - General (Internal Medicine) Wendall Stade, MD as Attending Physician (Cardiology)   HPI  [name] Christensen is a 80 y.o. male Seen follow up for  ICD implantation-single chamber undertaken for aborted cardiac arrest.  Catheterization 12/13 demonstrating patent grafts with an atretic LIMA; LV function was normal  S/p CABG   minimal exertional chest pain.  Functional status is stable.       Past Medical History  Diagnosis Date  . Coronary artery disease     a. s/p MI/CABG 2005. b. s/p cath at time of VF arrest 10/2013 - grafts patent.  [name] GERD (gastroesophageal reflux disease)   . Hyperlipidemia   . Hypertension   . BPH (benign prostatic hypertrophy)     elevated PSA Dr. Lindell Noe Bx 2010  . RBBB   . Cardiac arrest     a. out-of-hospital arrest 10/2013 - EF 40-45%, patent grafts on cath, received [employer] Jude AICD.  [name] Ventricular bigeminy     a. Event monitor 01/2013: NSR with PVCs and occ bigeminy.  . Elevated LFTs     a. 10/2012 felt due to cardiac arrest - Hepatitis C Ab reactive from 11/16/2012>>Hep C RNA PCR negative 11/22/2012.   . Carotid disease, bilateral     a. 0-39% by doppers.    Past Surgical History  Procedure Laterality Date  . Cardiac catheterization  2007    with patent graft anatomy atretic left internal mammary  artery to the LAD which is nonobstructive. Will restart study June 08, 2007  . Coronary artery bypass graft  2005  . Lumbar fusion  09/2007  . Cardiac defibrillator placement  dec 2013    Dr. Graciela Husbands, [facility]. Jude  . Appendectomy      Current Outpatient Prescriptions  Medication Sig Dispense Refill  . alfuzosin (UROXATRAL) 10 MG 24 hr tablet Take 10 mg by mouth every evening.       [name] aspirin 81 MG chewable tablet Chew 1 tablet (81 mg total) by mouth daily.      [name] atenolol (TENORMIN) 50 MG tablet Take 25 mg by mouth daily. Take one tablet by mouth at night      . atorvastatin (LIPITOR) 40 MG tablet Take  0.5 tablets (20 mg total) by mouth daily.  90 tablet  3  . Cholecalciferol (VITAMIN D3) 1000 UNITS CAPS Take 1 capsule by mouth daily.        . clopidogrel (PLAVIX) 75 MG tablet Take 1 tablet (75 mg total) by mouth daily.  30 tablet  5  . diazepam (VALIUM) 5 MG tablet Take 0.5-1 tablets (2.5-5 mg total) by mouth every 12 (twelve) hours as needed.  30 tablet  1  . losartan (COZAAR) 50 MG tablet Take 25 mg by mouth daily. Take 1 tablet every day.      . Multiple Vitamin (MULTIVITAMIN WITH MINERALS) TABS Take 1 tablet by mouth daily.      . nitroGLYCERIN (NITROSTAT) 0.4 MG SL tablet Place 1 tablet (0.4 mg total) under the tongue every 5 (five) minutes as needed. For chest pain  25 tablet  3  . ranitidine (ZANTAC) 150 MG capsule Take 150 mg by mouth as needed for heartburn.        No current facility-administered medications for this visit.    Allergies  Allergen Reactions  . Ezetimibe     REACTION: numbness  . Niacin     REACTION: CP  . Penicillins Other (See Comments)  Blisters between fingers @ 15  . Pravastatin Sodium Other (See Comments)    Aches and pains in joints  . Rosuvastatin     REACTION: aches    Review of Systems negative except from HPI and PMH  Physical Exam BP 153/70  Pulse 65  Ht 5\' 8"  (1.727 m)  Wt 159 lb (72.122 kg)  BMI 24.18 kg/m2 Well developed and well nourished in no acute distress HENT normal E scleral and icterus clear Neck Supple JVP flat; carotids brisk and full Clear to ausculation  regularly irregular r rate and rhythm, no murmurs gallops or rub Soft with active bowel sounds No clubbing cyanosis none Edema Alert and oriented, grossly normal motor and sensory function Skin Warm and Dry   ECG demonstrates sinus rhythm with frequent ventricular ectopy in a pattern of trigeminy. His ECG is from March 2014  Assessment and  Plan

## 2013-12-22 NOTE — Assessment & Plan Note (Signed)
The patient's device was interrogated.  The information was reviewed. No changes were made in the programming.    

## 2013-12-23 ENCOUNTER — Encounter: Payer: Self-pay | Admitting: *Deleted

## 2013-12-23 ENCOUNTER — Ambulatory Visit (INDEPENDENT_AMBULATORY_CARE_PROVIDER_SITE_OTHER): Payer: Medicare Other | Admitting: *Deleted

## 2013-12-23 ENCOUNTER — Other Ambulatory Visit: Payer: Self-pay | Admitting: *Deleted

## 2013-12-23 DIAGNOSIS — I498 Other specified cardiac arrhythmias: Secondary | ICD-10-CM | POA: Diagnosis not present

## 2013-12-23 DIAGNOSIS — R002 Palpitations: Secondary | ICD-10-CM | POA: Diagnosis not present

## 2013-12-23 DIAGNOSIS — I259 Chronic ischemic heart disease, unspecified: Secondary | ICD-10-CM

## 2013-12-23 NOTE — Progress Notes (Signed)
Patient ID: [name] Christensen, male   DOB: 07/13/1932, 81 y.o.   MRN: [id number] E-Cardio 48 hour holter monitor applied to patient. 

## 2013-12-23 NOTE — Progress Notes (Deleted)
Patient ID: [name] Christensen, male   DOB: August 11, 1932, 80 y.o.   MRN: [id number] E-Cardio 48 hour holter monitor applied to patient.

## 2013-12-24 ENCOUNTER — Encounter (HOSPITAL_COMMUNITY): Payer: Medicare Other

## 2013-12-26 DIAGNOSIS — 419620001 Death: Secondary | SNOMED CT

## 2013-12-26 DEATH — deceased

## 2013-12-28 ENCOUNTER — Other Ambulatory Visit: Payer: Self-pay | Admitting: *Deleted

## 2013-12-28 MED ORDER — CLOPIDOGREL BISULFATE 75 MG PO TABS: 75.0000 mg | ORAL_TABLET | Freq: Every day | ORAL | Status: DC

## 2014-01-13 ENCOUNTER — Encounter: Payer: Self-pay | Admitting: Internal Medicine

## 2014-01-13 ENCOUNTER — Ambulatory Visit (HOSPITAL_COMMUNITY): Payer: Medicare Other | Attending: Internal Medicine | Admitting: Radiology

## 2014-01-13 ENCOUNTER — Ambulatory Visit (INDEPENDENT_AMBULATORY_CARE_PROVIDER_SITE_OTHER): Payer: Medicare Other | Admitting: Internal Medicine

## 2014-01-13 VITALS — BP 138/68 | HR 40 | Ht 68.0 in | Wt 162.8 lb

## 2014-01-13 DIAGNOSIS — H2510 Age-related nuclear cataract, unspecified eye: Secondary | ICD-10-CM | POA: Diagnosis not present

## 2014-01-13 DIAGNOSIS — I10 Essential (primary) hypertension: Secondary | ICD-10-CM | POA: Diagnosis not present

## 2014-01-13 DIAGNOSIS — I428 Other cardiomyopathies: Secondary | ICD-10-CM

## 2014-01-13 DIAGNOSIS — I469 Cardiac arrest, cause unspecified: Secondary | ICD-10-CM

## 2014-01-13 DIAGNOSIS — I2510 Atherosclerotic heart disease of native coronary artery without angina pectoris: Secondary | ICD-10-CM | POA: Diagnosis not present

## 2014-01-13 DIAGNOSIS — E785 Hyperlipidemia, unspecified: Secondary | ICD-10-CM | POA: Insufficient documentation

## 2014-01-13 DIAGNOSIS — I079 Rheumatic tricuspid valve disease, unspecified: Secondary | ICD-10-CM | POA: Insufficient documentation

## 2014-01-13 DIAGNOSIS — I259 Chronic ischemic heart disease, unspecified: Secondary | ICD-10-CM | POA: Diagnosis not present

## 2014-01-13 DIAGNOSIS — I499 Cardiac arrhythmia, unspecified: Secondary | ICD-10-CM

## 2014-01-13 DIAGNOSIS — Z95810 Presence of automatic (implantable) cardiac defibrillator: Secondary | ICD-10-CM | POA: Insufficient documentation

## 2014-01-13 DIAGNOSIS — I429 Cardiomyopathy, unspecified: Secondary | ICD-10-CM

## 2014-01-13 DIAGNOSIS — I498 Other specified cardiac arrhythmias: Secondary | ICD-10-CM

## 2014-01-13 NOTE — Patient Instructions (Signed)
Your physician recommends that you continue on your current medications as directed. Please refer to the Current Medication list given to you today.  Your physician has requested that you have an echocardiogram today at 11:30 am. Echocardiography is a painless test that uses sound waves to create images of your heart. It provides your doctor with information about the size and shape of your heart and how well your heart's chambers and valves are working. This procedure takes approximately one hour. There are no restrictions for this procedure.  Follow up to be determined once echo results have been reviewed by Dr. [name]

## 2014-01-13 NOTE — Progress Notes (Signed)
Patient Care Team: Tresa Garter, MD as PCP - General (Internal Medicine) Wendall Stade, MD as Attending Physician (Cardiology)   HPI  [name] Christensen is a 80 y.o. male Seen follow up for ICD implantation-single chamber undertaken for aborted cardiac arrest. Catheterization 12/13 demonstrating patent grafts with an atretic LIMA; LV function was normal S/p CABG  He was seen last month is noted to have significant ectopy with a pattern of bigeminy. Previous echo had demonstrated an ejection fraction of 45% in 2013. Holter demonstrated approximately 13% PVCs  He denies impairment in exercise tolerance   Past Medical History  Diagnosis Date  . Coronary artery disease     a. s/p MI/CABG 2005. b. s/p cath at time of VF arrest 10/2013 - grafts patent.  [name] GERD (gastroesophageal reflux disease)   . Hyperlipidemia   . Hypertension   . BPH (benign prostatic hypertrophy)     elevated PSA Dr. Lindell Noe Bx 2010  . RBBB   . Cardiac arrest     a. out-of-hospital arrest 10/2013 - EF 40-45%, patent grafts on cath, received [employer] Jude AICD.  [name] Ventricular bigeminy     a. Event monitor 01/2013: NSR with PVCs and occ bigeminy.  . Elevated LFTs     a. 10/2012 felt due to cardiac arrest - Hepatitis C Ab reactive from 11/20/2012>>Hep C RNA PCR negative 10/26/2012.   . Carotid disease, bilateral     a. 0-39% by doppers.    Past Surgical History  Procedure Laterality Date  . Cardiac catheterization  2007    with patent graft anatomy atretic left internal mammary  artery to the LAD which is nonobstructive. Will restart study June 08, 2007  . Coronary artery bypass graft  2005  . Lumbar fusion  09/2007  . Cardiac defibrillator placement  dec 2013    Dr. Graciela Husbands, [facility]. Jude  . Appendectomy      Current Outpatient Prescriptions  Medication Sig Dispense Refill  . alfuzosin (UROXATRAL) 10 MG 24 hr tablet Take 10 mg by mouth every evening.       [name] aspirin 81 MG chewable tablet Chew 1 tablet (81 mg  total) by mouth daily.      [name] atenolol (TENORMIN) 50 MG tablet Take 25 mg by mouth daily. Take one tablet by mouth at night      . atorvastatin (LIPITOR) 40 MG tablet Take 0.5 tablets (20 mg total) by mouth daily.  90 tablet  3  . Cholecalciferol (VITAMIN D3) 1000 UNITS CAPS Take 1 capsule by mouth daily.        . clopidogrel (PLAVIX) 75 MG tablet Take 1 tablet (75 mg total) by mouth daily.  30 tablet  5  . diazepam (VALIUM) 5 MG tablet Take 0.5-1 tablets (2.5-5 mg total) by mouth every 12 (twelve) hours as needed.  30 tablet  1  . losartan (COZAAR) 50 MG tablet Take 25 mg by mouth daily. Take 1 tablet every day.      . Multiple Vitamin (MULTIVITAMIN WITH MINERALS) TABS Take 1 tablet by mouth daily.      . nitroGLYCERIN (NITROSTAT) 0.4 MG SL tablet Place 1 tablet (0.4 mg total) under the tongue every 5 (five) minutes as needed. For chest pain  25 tablet  3  . ranitidine (ZANTAC) 150 MG capsule Take 150 mg by mouth as needed for heartburn.        No current facility-administered medications for this visit.    Allergies  Allergen Reactions  . Ezetimibe     REACTION: numbness  . Niacin     REACTION: CP  . Penicillins Other (See Comments)    Blisters between fingers @ 15  . Pravastatin Sodium Other (See Comments)    Aches and pains in joints  . Rosuvastatin     REACTION: aches    Review of Systems negative except from HPI and PMH  Physical Exam BP 138/68  Pulse 40  Ht 5\' 8"  (1.727 m)  Wt 162 lb 12.8 oz (73.846 kg)  BMI 24.76 kg/m2 Well developed and well nourished in no acute distress HENT normal E scleral and icterus clear Neck Supple JVP flat; carotids brisk and full Clear to ausculation Regularly irRegular rate and rhythm, no murmurs gallops or rub Soft with active bowel sounds No clubbing cyanosis none Edema Alert and oriented, grossly normal motor and sensory function Skin Warm and Dry  Holter was reviewed and demonstrated 10-15% PVCs with a morphology previously  identified as right bundle branch superior axis  Assessment and  Plan  PVCs-right bundle branch superior axis high burden as noted. In the absence of symptoms, intervention would be directed by impact on left ventricular function. We'll reassess by echo  Cardiomyopathy ejection fraction 45% 2013 As above   History of aborted sudden cardiac death continue current meds  Implantable defibrillator-St. Jude The patient's device was interrogated.  The information was reviewed. No changes were made in the programming.      Ischemic cardiomyopathy with prior CABG continue current meds

## 2014-01-13 NOTE — Progress Notes (Signed)
Echocardiogram Performed. 

## 2014-01-17 DIAGNOSIS — L821 Other seborrheic keratosis: Secondary | ICD-10-CM | POA: Diagnosis not present

## 2014-01-17 DIAGNOSIS — L578 Other skin changes due to chronic exposure to nonionizing radiation: Secondary | ICD-10-CM | POA: Diagnosis not present

## 2014-01-17 DIAGNOSIS — L570 Actinic keratosis: Secondary | ICD-10-CM | POA: Diagnosis not present

## 2014-01-17 DIAGNOSIS — I789 Disease of capillaries, unspecified: Secondary | ICD-10-CM | POA: Diagnosis not present

## 2014-01-17 DIAGNOSIS — L905 Scar conditions and fibrosis of skin: Secondary | ICD-10-CM | POA: Diagnosis not present

## 2014-01-17 DIAGNOSIS — L820 Inflamed seborrheic keratosis: Secondary | ICD-10-CM | POA: Diagnosis not present

## 2014-01-21 ENCOUNTER — Encounter: Payer: Self-pay | Admitting: Internal Medicine

## 2014-01-23 DIAGNOSIS — 419620001 Death: Secondary | SNOMED CT | POA: Diagnosis not present

## 2014-01-23 DEATH — deceased

## 2014-01-31 ENCOUNTER — Encounter: Payer: Self-pay | Admitting: Internal Medicine

## 2014-01-31 ENCOUNTER — Ambulatory Visit (INDEPENDENT_AMBULATORY_CARE_PROVIDER_SITE_OTHER): Payer: Medicare Other | Admitting: Internal Medicine

## 2014-01-31 VITALS — BP 140/78 | HR 76 | Temp 97.8°F | Resp 16 | Ht 68.0 in | Wt 161.0 lb

## 2014-01-31 DIAGNOSIS — G5711 Meralgia paresthetica, right lower limb: Secondary | ICD-10-CM | POA: Insufficient documentation

## 2014-01-31 DIAGNOSIS — I2510 Atherosclerotic heart disease of native coronary artery without angina pectoris: Secondary | ICD-10-CM | POA: Diagnosis not present

## 2014-01-31 DIAGNOSIS — G5710 Meralgia paresthetica, unspecified lower limb: Secondary | ICD-10-CM

## 2014-01-31 DIAGNOSIS — Z0000 Encounter for general adult medical examination without abnormal findings: Secondary | ICD-10-CM | POA: Diagnosis not present

## 2014-01-31 DIAGNOSIS — E785 Hyperlipidemia, unspecified: Secondary | ICD-10-CM

## 2014-01-31 DIAGNOSIS — N400 Enlarged prostate without lower urinary tract symptoms: Secondary | ICD-10-CM

## 2014-01-31 DIAGNOSIS — I10 Essential (primary) hypertension: Secondary | ICD-10-CM | POA: Diagnosis not present

## 2014-01-31 DIAGNOSIS — R209 Unspecified disturbances of skin sensation: Secondary | ICD-10-CM

## 2014-01-31 DIAGNOSIS — Z23 Encounter for immunization: Secondary | ICD-10-CM

## 2014-01-31 DIAGNOSIS — R202 Paresthesia of skin: Secondary | ICD-10-CM

## 2014-01-31 MED ORDER — BETAMETHASONE DIPROPIONATE 0.05 % EX CREA: TOPICAL_CREAM | Freq: Two times a day (BID) | CUTANEOUS | Status: DC

## 2014-01-31 NOTE — Assessment & Plan Note (Addendum)
Here for medicare wellness/physical  Diet: heart healthy  Physical activity: sedentary  Depression/mood screen: negative  Hearing: intact to whispered voice  Visual acuity: grossly normal, performs annual eye exam  ADLs: capable  Fall risk: none  Home safety: good  Cognitive evaluation: intact to orientation, naming, recall and repetition  EOL planning: adv directives, full code/ I agree  I have personally reviewed and have noted  1. The patient's medical and social history  2. Their use of alcohol, tobacco or illicit drugs  3. Their current medications and supplements  4. The patient's functional ability including ADL's, fall risks, home safety risks and hearing or visual impairment.  5. Diet and physical activities  6. Evidence for depression or mood disorders    Today patient counseled on age appropriate routine health concerns for screening and prevention, each reviewed and up to date or declined. Immunizations reviewed and up to date or declined. Labs ordered and reviewed. Risk factors for depression reviewed and negative. Hearing function and visual acuity are intact. ADLs screened and addressed as needed. Functional ability and level of safety reviewed and appropriate. Education, counseling and referrals performed based on assessed risks today. Patient provided with a copy of personalized plan for preventive services.    Cologuard Prevnar

## 2014-01-31 NOTE — Progress Notes (Signed)
Pre visit review using our clinic review tool, if applicable. No additional management support is needed unless otherwise documented below in the visit note. 

## 2014-01-31 NOTE — Assessment & Plan Note (Signed)
Continue with current prescription therapy as reflected on the Med list.  

## 2014-01-31 NOTE — Assessment & Plan Note (Signed)
Move the phone clip

## 2014-01-31 NOTE — Progress Notes (Signed)
Subjective:   HPI The patient is here for a wellness exam. The patient has been doing well overall without major physical or psychological issues going on lately.  C/o R thigh tingling and numbness  Kathie Rhodes - wife - [phone]  Wt Readings from Last 3 Encounters:  01/31/14 161 lb (73.029 kg)  12/31/2013 162 lb 12.8 oz (73.846 kg)  12/05/2013 159 lb (72.122 kg)   BP Readings from Last 3 Encounters:  01/31/14 140/78  01/07/2014 138/68  12/25/2013 153/70     s/p cardiac arrest on 11/24/2012   The patient presents for a follow-up of  chronic BPH, hypertension, chronic dyslipidemia,CAD controlled with medicines  Review of Systems  Constitutional: Negative for appetite change, fatigue and unexpected weight change.  HENT: Negative for congestion, nosebleeds, sneezing, sore throat and trouble swallowing.   Eyes: Negative for itching and visual disturbance.  Respiratory: Negative for cough.   Cardiovascular: Negative for chest pain, palpitations and leg swelling.  Gastrointestinal: Negative for nausea, diarrhea, blood in stool and abdominal distention.  Genitourinary: Negative for frequency and hematuria.  Musculoskeletal: Negative for back pain, gait problem, joint swelling and neck pain.  Skin: Negative for rash.  Neurological: Negative for dizziness, tremors, speech difficulty and weakness.  Psychiatric/Behavioral: Negative for sleep disturbance, dysphoric mood and agitation. The patient is not nervous/anxious.   Not coughing      Objective:   Physical Exam  Constitutional: He is oriented to person, place, and time. He appears well-developed and well-nourished. No distress.  HENT:  Head: Normocephalic and atraumatic.  Right Ear: External ear normal.  Left Ear: External ear normal.  Nose: Nose normal.  Mouth/Throat: Oropharynx is clear and moist. No oropharyngeal exudate.  Eyes: Conjunctivae and EOM are normal. Pupils are equal, round, and reactive to light. Right eye exhibits no  discharge. Left eye exhibits no discharge. No scleral icterus.  Neck: Normal range of motion. Neck supple. No JVD present. No tracheal deviation present. No thyromegaly present.  Cardiovascular: Normal rate, regular rhythm, normal heart sounds and intact distal pulses.  Exam reveals no gallop and no friction rub.   No murmur heard. Pulmonary/Chest: Effort normal and breath sounds normal. No stridor. No respiratory distress. He has no wheezes. He has no rales. He exhibits no tenderness.  Abdominal: Soft. Bowel sounds are normal. He exhibits no distension and no mass. There is no tenderness. There is no rebound and no guarding.  Genitourinary: Rectum normal, prostate normal and penis normal. Guaiac negative stool. No penile tenderness.  Musculoskeletal: Normal range of motion. He exhibits no edema and no tenderness.  Lymphadenopathy:    He has no cervical adenopathy.  Neurological: He is alert and oriented to person, place, and time. He has normal reflexes. No cranial nerve deficit. He exhibits normal muscle tone. Coordination normal.  Skin: Skin is warm and dry. No rash noted. He is not diaphoretic. No erythema. No pallor.  Psychiatric: He has a normal mood and affect. His behavior is normal. Judgment and thought content normal.  Phone clipped to belt on R  Lab Results  Component Value Date   WBC 7.6 08/30/2013   HGB 13.0 08/30/2013   HCT 38.3* 08/30/2013   PLT 144.0* 08/30/2013   GLUCOSE 106* 08/30/2013   CHOL 177 12/28/2012   TRIG 129.0 01/17/2013   HDL 38.30* 01/12/2013   LDLCALC 113* 01/12/2013   ALT 16 08/30/2013   AST 25 08/30/2013   NA 133* 08/30/2013   K 4.6 08/30/2013   CL 99 08/30/2013  CREATININE 1.2 08/30/2013   BUN 14 08/30/2013   CO2 31 08/30/2013   TSH 2.793 04/06/2013   PSA 7.17* 01/19/2013   INR 1.32 10/26/2012   HGBA1C 6.3* 11/24/2012       Assessment & Plan:

## 2014-02-01 DIAGNOSIS — R972 Elevated prostate specific antigen [PSA]: Secondary | ICD-10-CM | POA: Diagnosis not present

## 2014-02-02 ENCOUNTER — Other Ambulatory Visit (INDEPENDENT_AMBULATORY_CARE_PROVIDER_SITE_OTHER): Payer: Medicare Other

## 2014-02-02 DIAGNOSIS — I2510 Atherosclerotic heart disease of native coronary artery without angina pectoris: Secondary | ICD-10-CM

## 2014-02-02 DIAGNOSIS — R209 Unspecified disturbances of skin sensation: Secondary | ICD-10-CM

## 2014-02-02 DIAGNOSIS — E785 Hyperlipidemia, unspecified: Secondary | ICD-10-CM | POA: Diagnosis not present

## 2014-02-02 DIAGNOSIS — R202 Paresthesia of skin: Secondary | ICD-10-CM

## 2014-02-02 DIAGNOSIS — G5710 Meralgia paresthetica, unspecified lower limb: Secondary | ICD-10-CM | POA: Diagnosis not present

## 2014-02-02 DIAGNOSIS — Z0000 Encounter for general adult medical examination without abnormal findings: Secondary | ICD-10-CM | POA: Diagnosis not present

## 2014-02-02 DIAGNOSIS — I10 Essential (primary) hypertension: Secondary | ICD-10-CM

## 2014-02-02 DIAGNOSIS — G5711 Meralgia paresthetica, right lower limb: Secondary | ICD-10-CM

## 2014-02-02 LAB — CBC WITH DIFFERENTIAL/PLATELET
Basophils Absolute: 0 10*3/uL (ref 0.0–0.1)
Basophils Relative: 0.4 % (ref 0.0–3.0)
Eosinophils Absolute: 0.2 10*3/uL (ref 0.0–0.7)
Eosinophils Relative: 3 % (ref 0.0–5.0)
HCT: 39.3 % (ref 39.0–52.0)
Hemoglobin: 13.3 g/dL (ref 13.0–17.0)
Lymphocytes Relative: 28.7 % (ref 12.0–46.0)
Lymphs Abs: 2.2 10*3/uL (ref 0.7–4.0)
MCHC: 34 g/dL (ref 30.0–36.0)
MCV: 90.8 fl (ref 78.0–100.0)
Monocytes Absolute: 0.6 10*3/uL (ref 0.1–1.0)
Monocytes Relative: 8.4 % (ref 3.0–12.0)
Neutro Abs: 4.5 10*3/uL (ref 1.4–7.7)
Neutrophils Relative %: 59.5 % (ref 43.0–77.0)
Platelets: 157 10*3/uL (ref 150.0–400.0)
RBC: 4.33 Mil/uL (ref 4.22–5.81)
RDW: 13 % (ref 11.5–14.6)
WBC: 7.6 10*3/uL (ref 4.5–10.5)

## 2014-02-02 LAB — LIPID PANEL
Cholesterol: 134 mg/dL (ref 0–200)
HDL: 48.3 mg/dL (ref >39.00)
LDL Cholesterol: 76 mg/dL (ref 0–99)
Total CHOL/HDL Ratio: 3
Triglycerides: 47 mg/dL (ref 0.0–149.0)
VLDL: 9.4 mg/dL (ref 0.0–40.0)

## 2014-02-02 LAB — BASIC METABOLIC PANEL
BUN: 15 mg/dL (ref 6–23)
CO2: 27 mEq/L (ref 19–32)
Calcium: 10 mg/dL (ref 8.4–10.5)
Chloride: 101 mEq/L (ref 96–112)
Creatinine, Ser: 1 mg/dL (ref 0.4–1.5)
GFR: 75.68 mL/min (ref >60.00)
Glucose, Bld: 92 mg/dL (ref 70–99)
Potassium: 4.5 mEq/L (ref 3.5–5.1)
Sodium: 134 mEq/L — ABNORMAL LOW (ref 135–145)

## 2014-02-02 LAB — HEPATIC FUNCTION PANEL
ALT: 21 U/L (ref 0–53)
AST: 28 U/L (ref 0–37)
Albumin: 4.1 g/dL (ref 3.5–5.2)
Alkaline Phosphatase: 77 U/L (ref 39–117)
Bilirubin, Direct: 0.1 mg/dL (ref 0.0–0.3)
Total Bilirubin: 0.8 mg/dL (ref 0.3–1.2)
Total Protein: 7.7 g/dL (ref 6.0–8.3)

## 2014-02-02 LAB — URINALYSIS
Bilirubin Urine: NEGATIVE
Hgb urine dipstick: NEGATIVE
Ketones, ur: NEGATIVE
Leukocytes, UA: NEGATIVE
Nitrite: NEGATIVE
Specific Gravity, Urine: 1.015 (ref 1.000–1.030)
Total Protein, Urine: NEGATIVE
Urine Glucose: NEGATIVE
Urobilinogen, UA: 0.2 (ref 0.0–1.0)
pH: 6.5 (ref 5.0–8.0)

## 2014-02-02 LAB — TSH: TSH: 1.9 u[IU]/mL (ref 0.35–5.50)

## 2014-02-02 LAB — VITAMIN B12: Vitamin B-12: 506 pg/mL (ref 211–911)

## 2014-02-14 DIAGNOSIS — Z1211 Encounter for screening for malignant neoplasm of colon: Secondary | ICD-10-CM | POA: Diagnosis not present

## 2014-02-14 LAB — COLOGUARD: Cologuard: NEGATIVE

## 2014-02-21 DIAGNOSIS — R972 Elevated prostate specific antigen [PSA]: Secondary | ICD-10-CM | POA: Diagnosis not present

## 2014-02-22 ENCOUNTER — Encounter: Payer: Self-pay | Admitting: Internal Medicine

## 2014-02-22 ENCOUNTER — Ambulatory Visit (INDEPENDENT_AMBULATORY_CARE_PROVIDER_SITE_OTHER): Payer: Medicare Other | Admitting: Internal Medicine

## 2014-02-22 ENCOUNTER — Telehealth: Payer: Self-pay | Admitting: Internal Medicine

## 2014-02-22 VITALS — BP 130/66 | HR 76 | Temp 98.2°F | Resp 16 | Wt 160.0 lb

## 2014-02-22 DIAGNOSIS — G5710 Meralgia paresthetica, unspecified lower limb: Secondary | ICD-10-CM | POA: Diagnosis not present

## 2014-02-22 DIAGNOSIS — E785 Hyperlipidemia, unspecified: Secondary | ICD-10-CM | POA: Diagnosis not present

## 2014-02-22 DIAGNOSIS — J309 Allergic rhinitis, unspecified: Secondary | ICD-10-CM

## 2014-02-22 DIAGNOSIS — I2510 Atherosclerotic heart disease of native coronary artery without angina pectoris: Secondary | ICD-10-CM | POA: Diagnosis not present

## 2014-02-22 DIAGNOSIS — J019 Acute sinusitis, unspecified: Secondary | ICD-10-CM | POA: Diagnosis not present

## 2014-02-22 DIAGNOSIS — G5711 Meralgia paresthetica, right lower limb: Secondary | ICD-10-CM

## 2014-02-22 MED ORDER — LORATADINE 10 MG PO TABS: 10.0000 mg | ORAL_TABLET | Freq: Every day | ORAL | Status: DC

## 2014-02-22 MED ORDER — AZITHROMYCIN 250 MG PO TABS: ORAL_TABLET | ORAL | Status: DC

## 2014-02-22 MED ORDER — METHYLPREDNISOLONE ACETATE 80 MG/ML IJ SUSP
80.0000 mg | Freq: Once | INTRAMUSCULAR | Status: AC
  Administered 2014-02-23: 80 mg via INTRAMUSCULAR

## 2014-02-22 NOTE — Assessment & Plan Note (Signed)
Continue with current prescription therapy as reflected on the Med list.  

## 2014-02-22 NOTE — Progress Notes (Signed)
Subjective:   URI  This is a new problem. The current episode started in the past 7 days (4d). The problem has been unchanged. There has been no fever. Associated symptoms include chest pain, congestion, joint pain, rhinorrhea, sinus pain and sneezing. Pertinent negatives include no coughing, diarrhea, nausea, neck pain, rash or sore throat. He has tried acetaminophen and antihistamine for the symptoms. The treatment provided no relief.       Kathie Rhodes - wife - [phone]  Wt Readings from Last 3 Encounters:  02/22/14 160 lb (72.576 kg)  01/31/14 161 lb (73.029 kg)  01/01/2014 162 lb 12.8 oz (73.846 kg)   BP Readings from Last 3 Encounters:  02/22/14 130/66  01/31/14 140/78  01/03/2014 138/68     s/p cardiac arrest on 11/16/2012   The patient presents for a follow-up of  chronic BPH, hypertension, chronic dyslipidemia,CAD controlled with medicines  Review of Systems  Constitutional: Negative for appetite change, fatigue and unexpected weight change.  HENT: Positive for congestion, rhinorrhea and sneezing. Negative for nosebleeds, sore throat and trouble swallowing.   Eyes: Negative for itching and visual disturbance.  Respiratory: Negative for cough.   Cardiovascular: Positive for chest pain. Negative for palpitations and leg swelling.  Gastrointestinal: Negative for nausea, diarrhea, blood in stool and abdominal distention.  Genitourinary: Negative for frequency and hematuria.  Musculoskeletal: Positive for joint pain. Negative for back pain, gait problem, joint swelling and neck pain.  Skin: Negative for rash.  Neurological: Negative for dizziness, tremors, speech difficulty and weakness.  Psychiatric/Behavioral: Negative for sleep disturbance, dysphoric mood and agitation. The patient is not nervous/anxious.   Not coughing      Objective:   Physical Exam  Constitutional: He is oriented to person, place, and time. He appears well-developed and well-nourished. No distress.   HENT:  Head: Normocephalic and atraumatic.  Right Ear: External ear normal.  Left Ear: External ear normal.  Nose: Nose normal.  Mouth/Throat: Oropharynx is clear and moist. No oropharyngeal exudate.  Eyes: Conjunctivae and EOM are normal. Pupils are equal, round, and reactive to light. Right eye exhibits no discharge. Left eye exhibits no discharge. No scleral icterus.  Neck: Normal range of motion. Neck supple. No JVD present. No tracheal deviation present. No thyromegaly present.  Cardiovascular: Normal rate, regular rhythm, normal heart sounds and intact distal pulses.  Exam reveals no gallop and no friction rub.   No murmur heard. Pulmonary/Chest: Effort normal and breath sounds normal. No stridor. No respiratory distress. He has no wheezes. He has no rales. He exhibits no tenderness.  Abdominal: Soft. Bowel sounds are normal. He exhibits no distension and no mass. There is no tenderness. There is no rebound and no guarding.  Genitourinary: Rectum normal, prostate normal and penis normal. Guaiac negative stool. No penile tenderness.  Musculoskeletal: Normal range of motion. He exhibits no edema and no tenderness.  Lymphadenopathy:    He has no cervical adenopathy.  Neurological: He is alert and oriented to person, place, and time. He has normal reflexes. No cranial nerve deficit. He exhibits normal muscle tone. Coordination normal.  Skin: Skin is warm and dry. No rash noted. He is not diaphoretic. No erythema. No pallor.  Psychiatric: He has a normal mood and affect. His behavior is normal. Judgment and thought content normal.     Subjective:   HPI   C/o low and high BP lately: SBP=116-178  More high then low Kathie Rhodes - wife - [phone]  Wt Readings from Last 3  Encounters:  02/22/14 160 lb (72.576 kg)  01/31/14 161 lb (73.029 kg)  01/15/2014 162 lb 12.8 oz (73.846 kg)   BP Readings from Last 3 Encounters:  02/22/14 130/66  01/31/14 140/78  12/27/2013 138/68     s/p  cardiac arrest on 11/18/2012   The patient presents for a follow-up of  chronic BPH, hypertension, chronic dyslipidemia,CAD controlled with medicines  Review of Systems  Constitutional: Negative for appetite change, fatigue and unexpected weight change.  HENT: Negative for congestion, nosebleeds, sneezing, sore throat and trouble swallowing.   Eyes: Negative for itching and visual disturbance.  Respiratory: Negative for cough.   Cardiovascular: Negative for chest pain, palpitations and leg swelling.  Gastrointestinal: Negative for nausea, diarrhea, blood in stool and abdominal distention.  Genitourinary: Negative for frequency and hematuria.  Musculoskeletal: Negative for back pain, gait problem, joint swelling and neck pain.  Skin: Negative for rash.  Neurological: Negative for dizziness, tremors, speech difficulty and weakness.  Psychiatric/Behavioral: Negative for sleep disturbance, dysphoric mood and agitation. The patient is not nervous/anxious.   coughing      Objective:   Physical Exam  Constitutional: He is oriented to person, place, and time. He appears well-developed and well-nourished. No distress.  HENT:  Head: Normocephalic and atraumatic.  Right Ear: External ear normal.  Left Ear: External ear normal.  Nose: Nose normal.  Mouth/Throat: Oropharynx is clear and moist. No oropharyngeal exudate.  Eyes: Conjunctivae and EOM are normal. Pupils are equal, round, and reactive to light. Right eye exhibits no discharge. Left eye exhibits no discharge. No scleral icterus.  Neck: Normal range of motion. Neck supple. No JVD present. No tracheal deviation present. No thyromegaly present.  Cardiovascular: Normal rate, regular rhythm, normal heart sounds and intact distal pulses.  Exam reveals no gallop and no friction rub.   No murmur heard. Pulmonary/Chest: Effort normal and breath sounds normal. No stridor. No respiratory distress. He has no wheezes. He has no rales. He exhibits no  tenderness.  Abdominal: Soft. Bowel sounds are normal. He exhibits no distension and no mass. There is no tenderness. There is no rebound and no guarding.  Genitourinary: Rectum normal, prostate normal and penis normal. Guaiac negative stool. No penile tenderness.  Musculoskeletal: Normal range of motion. He exhibits no edema and no tenderness.  Lymphadenopathy:    He has no cervical adenopathy.  Neurological: He is alert and oriented to person, place, and time. He has normal reflexes. No cranial nerve deficit. He exhibits normal muscle tone. Coordination normal.  Skin: Skin is warm and dry. No rash noted. He is not diaphoretic. No erythema. No pallor.  Psychiatric: He has a normal mood and affect. His behavior is normal. Judgment and thought content normal.    Lab Results  Component Value Date   WBC 7.6 02/02/2014   HGB 13.3 02/02/2014   HCT 39.3 02/02/2014   PLT 157.0 02/02/2014   GLUCOSE 92 02/02/2014   CHOL 134 02/02/2014   TRIG 47.0 02/02/2014   HDL 48.30 02/02/2014   LDLCALC 76 02/02/2014   ALT 21 02/02/2014   AST 28 02/02/2014   NA 134* 02/02/2014   K 4.5 02/02/2014   CL 101 02/02/2014   CREATININE 1.0 02/02/2014   BUN 15 02/02/2014   CO2 27 02/02/2014   TSH 1.90 02/02/2014   PSA 7.17* 01/10/2013   INR 1.32 11/21/2012   HGBA1C 6.3* 10/30/2012       Assessment & Plan:  Phone clipped to belt on R  Lab Results  Component Value Date   WBC 7.6 02/02/2014   HGB 13.3 02/02/2014   HCT 39.3 02/02/2014   PLT 157.0 02/02/2014   GLUCOSE 92 02/02/2014   CHOL 134 02/02/2014   TRIG 47.0 02/02/2014   HDL 48.30 02/02/2014   LDLCALC 76 02/02/2014   ALT 21 02/02/2014   AST 28 02/02/2014   NA 134* 02/02/2014   K 4.5 02/02/2014   CL 101 02/02/2014   CREATININE 1.0 02/02/2014   BUN 15 02/02/2014   CO2 27 02/02/2014   TSH 1.90 02/02/2014   PSA 7.17* 12/30/2012   INR 1.32 11/20/2012   HGBA1C 6.3* 10/25/2012       Assessment & Plan:

## 2014-02-22 NOTE — Telephone Encounter (Signed)
New message    Fax request sent over on yesterday . Patient is very persistent on the cardiac clearance.

## 2014-02-22 NOTE — Assessment & Plan Note (Signed)
Worse. Depomedrol 80 mg IM Cont Claritin qd

## 2014-02-22 NOTE — Assessment & Plan Note (Signed)
Hold Rx x 1 wk due to myalgias  Take CoQ10

## 2014-02-22 NOTE — Progress Notes (Signed)
Pre visit review using our clinic review tool, if applicable. No additional management support is needed unless otherwise documented below in the visit note. 

## 2014-02-22 NOTE — Patient Instructions (Signed)
Hold Lipitor x 1 week Try CoQ10 Netty pot

## 2014-02-22 NOTE — Assessment & Plan Note (Signed)
See Abx Rx

## 2014-02-28 NOTE — Telephone Encounter (Signed)
PT AWARE  CLEARANCE  FORM  FAXED TO   [facility]./CY

## 2014-03-14 ENCOUNTER — Telehealth: Payer: Self-pay | Admitting: *Deleted

## 2014-03-14 DIAGNOSIS — R972 Elevated prostate specific antigen [PSA]: Secondary | ICD-10-CM | POA: Diagnosis not present

## 2014-03-14 NOTE — Telephone Encounter (Signed)
Pt called requesting Cologuard results.  Please advise

## 2014-03-14 NOTE — Telephone Encounter (Signed)
I don't have them yet Thx

## 2014-03-15 NOTE — Telephone Encounter (Signed)
Spoke with pt advised of MDs message 

## 2014-04-15 DIAGNOSIS — H2510 Age-related nuclear cataract, unspecified eye: Secondary | ICD-10-CM | POA: Diagnosis not present

## 2014-04-18 ENCOUNTER — Encounter: Payer: Self-pay | Admitting: Internal Medicine

## 2014-04-19 ENCOUNTER — Ambulatory Visit (INDEPENDENT_AMBULATORY_CARE_PROVIDER_SITE_OTHER): Payer: Medicare Other | Admitting: *Deleted

## 2014-04-19 DIAGNOSIS — I469 Cardiac arrest, cause unspecified: Secondary | ICD-10-CM

## 2014-04-19 NOTE — Progress Notes (Signed)
Remote ICD transmission.   

## 2014-04-25 ENCOUNTER — Telehealth: Payer: Self-pay | Admitting: *Deleted

## 2014-04-25 DIAGNOSIS — 419620001 Death: Secondary | SNOMED CT | POA: Diagnosis not present

## 2014-04-25 NOTE — Telephone Encounter (Signed)
Rf req for Diazepam 5 mg 1/2-1 po 12 hours prn. # 30. Ok to Rf?

## 2014-04-25 NOTE — Telephone Encounter (Signed)
OK to fill this prescription with additional refills x3 Thank you!  

## 2014-04-25 DEATH — deceased

## 2014-04-26 LAB — MDC_IDC_ENUM_SESS_TYPE_REMOTE
Battery Remaining Longevity: 84 mo
Battery Remaining Percentage: 86 %
Battery Voltage: 3.01 V
Brady Statistic RV Percent Paced: 1 %
Date Time Interrogation Session: 20150525060017
HighPow Impedance: 75 Ohm
HighPow Impedance: 75 Ohm
Implantable Pulse Generator Serial Number: 7048849
Lead Channel Impedance Value: 330 Ohm
Lead Channel Pacing Threshold Amplitude: 1.25 V
Lead Channel Pacing Threshold Pulse Width: 0.8 ms
Lead Channel Sensing Intrinsic Amplitude: 5.2 mV
Lead Channel Setting Pacing Amplitude: 2.5 V
Lead Channel Setting Pacing Pulse Width: 0.8 ms
Lead Channel Setting Sensing Sensitivity: 0.5 mV
Zone Setting Detection Interval: 250 ms
Zone Setting Detection Interval: 300 ms

## 2014-04-26 MED ORDER — DIAZEPAM 5 MG PO TABS: 2.5000 mg | ORAL_TABLET | Freq: Two times a day (BID) | ORAL | Status: DC | PRN

## 2014-04-26 NOTE — Telephone Encounter (Signed)
Done

## 2014-05-24 ENCOUNTER — Encounter: Payer: Self-pay | Admitting: Cardiology

## 2014-05-31 ENCOUNTER — Ambulatory Visit: Payer: Self-pay | Admitting: Ophthalmology

## 2014-05-31 ENCOUNTER — Telehealth: Payer: Self-pay | Admitting: *Deleted

## 2014-05-31 DIAGNOSIS — K219 Gastro-esophageal reflux disease without esophagitis: Secondary | ICD-10-CM | POA: Diagnosis not present

## 2014-05-31 DIAGNOSIS — I509 Heart failure, unspecified: Secondary | ICD-10-CM | POA: Diagnosis not present

## 2014-05-31 DIAGNOSIS — H2510 Age-related nuclear cataract, unspecified eye: Secondary | ICD-10-CM | POA: Diagnosis not present

## 2014-05-31 DIAGNOSIS — Z01812 Encounter for preprocedural laboratory examination: Secondary | ICD-10-CM | POA: Diagnosis not present

## 2014-05-31 DIAGNOSIS — Z889 Allergy status to unspecified drugs, medicaments and biological substances status: Secondary | ICD-10-CM | POA: Diagnosis not present

## 2014-05-31 DIAGNOSIS — I10 Essential (primary) hypertension: Secondary | ICD-10-CM | POA: Diagnosis not present

## 2014-05-31 DIAGNOSIS — Z9889 Other specified postprocedural states: Secondary | ICD-10-CM | POA: Diagnosis not present

## 2014-05-31 DIAGNOSIS — I499 Cardiac arrhythmia, unspecified: Secondary | ICD-10-CM | POA: Diagnosis not present

## 2014-05-31 DIAGNOSIS — Z01810 Encounter for preprocedural cardiovascular examination: Secondary | ICD-10-CM | POA: Diagnosis not present

## 2014-05-31 LAB — POTASSIUM: Potassium: 4.1 mmol/L (ref 3.5–5.1)

## 2014-05-31 NOTE — Telephone Encounter (Signed)
Left msg on triage never received results back from the stool sample test.../lmb

## 2014-05-31 NOTE — Telephone Encounter (Signed)
Pls get results Thx

## 2014-06-01 NOTE — Telephone Encounter (Signed)
Per chart looks like stool sample wasn't done. No orders in the computer...Johny Chess

## 2014-06-01 NOTE — Telephone Encounter (Signed)
It should be a Cologuard test that he collected and fedex'ed, correct? Thx

## 2014-06-02 NOTE — Telephone Encounter (Signed)
[employer] spoke with rep he stated they fax md response back in March will refax results to [phone].Notified pt of status.....[name] Christensen

## 2014-06-03 NOTE — Telephone Encounter (Signed)
Notified pt results was negative,,,/lmb

## 2014-06-03 NOTE — Telephone Encounter (Signed)
Called [employer] again spoke with rep she stated fax has been fax results was negative. Faxing results again...[name] Christensen

## 2014-06-08 DIAGNOSIS — R972 Elevated prostate specific antigen [PSA]: Secondary | ICD-10-CM | POA: Diagnosis not present

## 2014-06-14 ENCOUNTER — Ambulatory Visit: Payer: Self-pay | Admitting: Ophthalmology

## 2014-06-14 DIAGNOSIS — H2510 Age-related nuclear cataract, unspecified eye: Secondary | ICD-10-CM | POA: Diagnosis not present

## 2014-06-14 DIAGNOSIS — K219 Gastro-esophageal reflux disease without esophagitis: Secondary | ICD-10-CM | POA: Diagnosis not present

## 2014-06-14 DIAGNOSIS — I2510 Atherosclerotic heart disease of native coronary artery without angina pectoris: Secondary | ICD-10-CM | POA: Diagnosis not present

## 2014-06-14 DIAGNOSIS — E7800 Pure hypercholesterolemia, unspecified: Secondary | ICD-10-CM | POA: Diagnosis not present

## 2014-06-14 DIAGNOSIS — Z7902 Long term (current) use of antithrombotics/antiplatelets: Secondary | ICD-10-CM | POA: Diagnosis not present

## 2014-06-14 DIAGNOSIS — Z888 Allergy status to other drugs, medicaments and biological substances status: Secondary | ICD-10-CM | POA: Diagnosis not present

## 2014-06-14 DIAGNOSIS — Z79899 Other long term (current) drug therapy: Secondary | ICD-10-CM | POA: Diagnosis not present

## 2014-06-14 DIAGNOSIS — Z7982 Long term (current) use of aspirin: Secondary | ICD-10-CM | POA: Diagnosis not present

## 2014-06-14 DIAGNOSIS — I10 Essential (primary) hypertension: Secondary | ICD-10-CM | POA: Diagnosis not present

## 2014-06-14 DIAGNOSIS — Z951 Presence of aortocoronary bypass graft: Secondary | ICD-10-CM | POA: Diagnosis not present

## 2014-06-14 DIAGNOSIS — H269 Unspecified cataract: Secondary | ICD-10-CM | POA: Diagnosis not present

## 2014-06-14 DIAGNOSIS — Z880 Allergy status to penicillin: Secondary | ICD-10-CM | POA: Diagnosis not present

## 2014-06-15 ENCOUNTER — Encounter: Payer: Self-pay | Admitting: Internal Medicine

## 2014-07-15 ENCOUNTER — Other Ambulatory Visit: Payer: Self-pay

## 2014-07-15 MED ORDER — CLOPIDOGREL BISULFATE 75 MG PO TABS: 75.0000 mg | ORAL_TABLET | Freq: Every day | ORAL | Status: DC

## 2014-07-25 ENCOUNTER — Encounter: Payer: Self-pay | Admitting: Internal Medicine

## 2014-07-25 ENCOUNTER — Ambulatory Visit (INDEPENDENT_AMBULATORY_CARE_PROVIDER_SITE_OTHER): Payer: Medicare Other | Admitting: *Deleted

## 2014-07-25 DIAGNOSIS — I469 Cardiac arrest, cause unspecified: Secondary | ICD-10-CM

## 2014-07-25 DIAGNOSIS — I428 Other cardiomyopathies: Secondary | ICD-10-CM

## 2014-07-25 DIAGNOSIS — I429 Cardiomyopathy, unspecified: Secondary | ICD-10-CM

## 2014-07-25 LAB — MDC_IDC_ENUM_SESS_TYPE_REMOTE
Battery Remaining Longevity: 82 mo
Battery Remaining Percentage: 83 %
Battery Voltage: 2.99 V
Brady Statistic RV Percent Paced: 1 %
Date Time Interrogation Session: 20150831060018
HighPow Impedance: 72 Ohm
HighPow Impedance: 72 Ohm
Implantable Pulse Generator Serial Number: 7048849
Lead Channel Impedance Value: 360 Ohm
Lead Channel Pacing Threshold Amplitude: 1.25 V
Lead Channel Pacing Threshold Pulse Width: 0.8 ms
Lead Channel Sensing Intrinsic Amplitude: 4.7 mV
Lead Channel Setting Pacing Amplitude: 2.5 V
Lead Channel Setting Pacing Pulse Width: 0.8 ms
Lead Channel Setting Sensing Sensitivity: 0.5 mV
Zone Setting Detection Interval: 250 ms
Zone Setting Detection Interval: 300 ms

## 2014-07-25 NOTE — Progress Notes (Signed)
Remote ICD transmission.   

## 2014-07-26 DIAGNOSIS — 419620001 Death: Secondary | SNOMED CT | POA: Diagnosis not present

## 2014-07-26 DEATH — deceased

## 2014-07-27 ENCOUNTER — Telehealth: Payer: Self-pay | Admitting: *Deleted

## 2014-07-27 DIAGNOSIS — E785 Hyperlipidemia, unspecified: Secondary | ICD-10-CM

## 2014-07-27 DIAGNOSIS — Z79899 Other long term (current) drug therapy: Secondary | ICD-10-CM

## 2014-07-27 MED ORDER — SIMVASTATIN 20 MG PO TABS: 20.0000 mg | ORAL_TABLET | Freq: Every day | ORAL | Status: DC

## 2014-07-27 NOTE — Telephone Encounter (Signed)
PER  DR NISHAN PT  MAY  SWITCH   ATORVASTATIN  TO  SIMVASTATIN  20 MG   AND  TO CHECK FASTING LIPID AND LIVER IN  3 MONTHS  IF  TOLERATES NEW SCRIPT  SENT  TO [facility]IC[facility]  AND  PT 'S  WIFE  AWARE OF  MED  CHANGE .Adonis Housekeeper

## 2014-08-03 ENCOUNTER — Ambulatory Visit (INDEPENDENT_AMBULATORY_CARE_PROVIDER_SITE_OTHER): Payer: Medicare Other | Admitting: Internal Medicine

## 2014-08-03 ENCOUNTER — Other Ambulatory Visit (INDEPENDENT_AMBULATORY_CARE_PROVIDER_SITE_OTHER): Payer: Medicare Other

## 2014-08-03 ENCOUNTER — Encounter: Payer: Self-pay | Admitting: Internal Medicine

## 2014-08-03 VITALS — BP 130/70 | HR 72 | Temp 98.2°F | Resp 16 | Wt 155.0 lb

## 2014-08-03 DIAGNOSIS — N320 Bladder-neck obstruction: Secondary | ICD-10-CM

## 2014-08-03 DIAGNOSIS — Z951 Presence of aortocoronary bypass graft: Secondary | ICD-10-CM | POA: Diagnosis not present

## 2014-08-03 DIAGNOSIS — I10 Essential (primary) hypertension: Secondary | ICD-10-CM

## 2014-08-03 DIAGNOSIS — I6523 Occlusion and stenosis of bilateral carotid arteries: Secondary | ICD-10-CM

## 2014-08-03 DIAGNOSIS — I2510 Atherosclerotic heart disease of native coronary artery without angina pectoris: Secondary | ICD-10-CM | POA: Diagnosis not present

## 2014-08-03 DIAGNOSIS — R5383 Other fatigue: Secondary | ICD-10-CM

## 2014-08-03 DIAGNOSIS — R5381 Other malaise: Secondary | ICD-10-CM

## 2014-08-03 DIAGNOSIS — N400 Enlarged prostate without lower urinary tract symptoms: Secondary | ICD-10-CM

## 2014-08-03 DIAGNOSIS — I6529 Occlusion and stenosis of unspecified carotid artery: Secondary | ICD-10-CM | POA: Diagnosis not present

## 2014-08-03 DIAGNOSIS — Z23 Encounter for immunization: Secondary | ICD-10-CM

## 2014-08-03 DIAGNOSIS — R0989 Other specified symptoms and signs involving the circulatory and respiratory systems: Secondary | ICD-10-CM

## 2014-08-03 DIAGNOSIS — I658 Occlusion and stenosis of other precerebral arteries: Secondary | ICD-10-CM

## 2014-08-03 LAB — HEPATIC FUNCTION PANEL
ALT: 31 U/L (ref 0–53)
AST: 34 U/L (ref 0–37)
Albumin: 4 g/dL (ref 3.5–5.2)
Alkaline Phosphatase: 65 U/L (ref 39–117)
Bilirubin, Direct: 0.2 mg/dL (ref 0.0–0.3)
Total Bilirubin: 1.2 mg/dL (ref 0.2–1.2)
Total Protein: 7.1 g/dL (ref 6.0–8.3)

## 2014-08-03 LAB — LIPID PANEL
Cholesterol: 166 mg/dL (ref 0–200)
HDL: 45.8 mg/dL (ref >39.00)
LDL Cholesterol: 107 mg/dL — ABNORMAL HIGH (ref 0–99)
NonHDL: 120.2
Total CHOL/HDL Ratio: 4
Triglycerides: 68 mg/dL (ref 0.0–149.0)
VLDL: 13.6 mg/dL (ref 0.0–40.0)

## 2014-08-03 LAB — BASIC METABOLIC PANEL
BUN: 15 mg/dL (ref 6–23)
CO2: 29 mEq/L (ref 19–32)
Calcium: 10.6 mg/dL — ABNORMAL HIGH (ref 8.4–10.5)
Chloride: 102 mEq/L (ref 96–112)
Creatinine, Ser: 1 mg/dL (ref 0.4–1.5)
GFR: 73.07 mL/min (ref >60.00)
Glucose, Bld: 84 mg/dL (ref 70–99)
Potassium: 4.6 mEq/L (ref 3.5–5.1)
Sodium: 136 mEq/L (ref 135–145)

## 2014-08-03 LAB — CBC WITH DIFFERENTIAL/PLATELET
Basophils Absolute: 0 10*3/uL (ref 0.0–0.1)
Basophils Relative: 0.2 % (ref 0.0–3.0)
Eosinophils Absolute: 0.1 10*3/uL (ref 0.0–0.7)
Eosinophils Relative: 1.9 % (ref 0.0–5.0)
HCT: 39.2 % (ref 39.0–52.0)
Hemoglobin: 13.2 g/dL (ref 13.0–17.0)
Lymphocytes Relative: 28.7 % (ref 12.0–46.0)
Lymphs Abs: 1.9 10*3/uL (ref 0.7–4.0)
MCHC: 33.7 g/dL (ref 30.0–36.0)
MCV: 91.1 fl (ref 78.0–100.0)
Monocytes Absolute: 0.5 10*3/uL (ref 0.1–1.0)
Monocytes Relative: 6.9 % (ref 3.0–12.0)
Neutro Abs: 4.2 10*3/uL (ref 1.4–7.7)
Neutrophils Relative %: 62.3 % (ref 43.0–77.0)
Platelets: 124 10*3/uL — ABNORMAL LOW (ref 150.0–400.0)
RBC: 4.3 Mil/uL (ref 4.22–5.81)
RDW: 13.3 % (ref 11.5–15.5)
WBC: 6.7 10*3/uL (ref 4.0–10.5)

## 2014-08-03 LAB — TSH: TSH: 1.41 u[IU]/mL (ref 0.35–4.50)

## 2014-08-03 LAB — PSA: PSA: 10.39 ng/mL — ABNORMAL HIGH (ref 0.10–4.00)

## 2014-08-03 MED ORDER — CLOTRIMAZOLE-BETAMETHASONE 1-0.05 % EX CREA: 1.0000 "application " | TOPICAL_CREAM | Freq: Two times a day (BID) | CUTANEOUS | Status: DC

## 2014-08-03 NOTE — Assessment & Plan Note (Signed)
Doppler scheduled.

## 2014-08-03 NOTE — Progress Notes (Signed)
Subjective:   HPI  The patient presents for a follow-up of  chronic BPH, hypertension, chronic dyslipidemia,CAD controlled with medicines   Kathie Rhodes - wife - [phone]  BP Readings from Last 3 Encounters:  08/03/14 130/70  02/22/14 130/66  01/31/14 140/78   Wt Readings from Last 3 Encounters:  08/03/14 155 lb (70.308 kg)  02/22/14 160 lb (72.576 kg)  01/31/14 161 lb (73.029 kg)      Review of Systems  Constitutional: Negative for appetite change, fatigue and unexpected weight change.  HENT: Negative for congestion, nosebleeds, sneezing, sore throat and trouble swallowing.   Eyes: Negative for itching and visual disturbance.  Respiratory: Negative for cough.   Cardiovascular: Negative for chest pain, palpitations and leg swelling.  Gastrointestinal: Negative for nausea, diarrhea, blood in stool and abdominal distention.  Genitourinary: Negative for frequency and hematuria.  Musculoskeletal: Negative for back pain, gait problem, joint swelling and neck pain.  Skin: Negative for rash.  Neurological: Negative for dizziness, tremors, speech difficulty and weakness.  Psychiatric/Behavioral: Negative for suicidal ideas, sleep disturbance, dysphoric mood and agitation. The patient is not nervous/anxious.        Objective:   Physical Exam  Constitutional: He is oriented to person, place, and time. He appears well-developed. No distress.  NAD  HENT:  Mouth/Throat: Oropharynx is clear and moist.  Eyes: Conjunctivae are normal. Pupils are equal, round, and reactive to light.  Neck: Normal range of motion. No JVD present. No thyromegaly present.  Cardiovascular: Normal rate, regular rhythm, normal heart sounds and intact distal pulses.  Exam reveals no gallop and no friction rub.   No murmur heard. Pulmonary/Chest: Effort normal and breath sounds normal. No respiratory distress. He has no wheezes. He has no rales. He exhibits no tenderness.  Abdominal: Soft. Bowel sounds are  normal. He exhibits no distension and no mass. There is no tenderness. There is no rebound and no guarding.  Musculoskeletal: Normal range of motion. He exhibits no edema and no tenderness.  Lymphadenopathy:    He has no cervical adenopathy.  Neurological: He is alert and oriented to person, place, and time. He has normal reflexes. No cranial nerve deficit. He exhibits normal muscle tone. He displays a negative Romberg sign. Coordination and gait normal.  No meningeal signs  Skin: Skin is warm and dry. No rash noted.  Psychiatric: He has a normal mood and affect. His behavior is normal. Judgment and thought content normal.    Lab Results  Component Value Date   WBC 7.6 02/02/2014   HGB 13.3 02/02/2014   HCT 39.3 02/02/2014   PLT 157.0 02/02/2014   GLUCOSE 92 02/02/2014   CHOL 134 02/02/2014   TRIG 47.0 02/02/2014   HDL 48.30 02/02/2014   LDLCALC 76 02/02/2014   ALT 21 02/02/2014   AST 28 02/02/2014   NA 134* 02/02/2014   K 4.5 02/02/2014   CL 101 02/02/2014   CREATININE 1.0 02/02/2014   BUN 15 02/02/2014   CO2 27 02/02/2014   TSH 1.90 02/02/2014   PSA 7.17* 01/19/2013   INR 1.32 10/28/2012   HGBA1C 6.3* 11/18/2012         Assessment & Plan:

## 2014-08-03 NOTE — Assessment & Plan Note (Signed)
Continue with current prescription therapy as reflected on the Med list.  

## 2014-08-03 NOTE — Progress Notes (Deleted)
Pre visit review using our clinic review tool, if applicable. No additional management support is needed unless otherwise documented below in the visit note. 

## 2014-08-03 NOTE — Assessment & Plan Note (Signed)
PSA

## 2014-08-04 ENCOUNTER — Encounter: Payer: Self-pay | Admitting: Cardiology

## 2014-08-08 ENCOUNTER — Ambulatory Visit (HOSPITAL_COMMUNITY): Payer: Medicare Other | Attending: Internal Medicine | Admitting: Cardiology

## 2014-08-08 DIAGNOSIS — I10 Essential (primary) hypertension: Secondary | ICD-10-CM | POA: Diagnosis not present

## 2014-08-08 DIAGNOSIS — E785 Hyperlipidemia, unspecified: Secondary | ICD-10-CM | POA: Insufficient documentation

## 2014-08-08 DIAGNOSIS — Z951 Presence of aortocoronary bypass graft: Secondary | ICD-10-CM | POA: Insufficient documentation

## 2014-08-08 DIAGNOSIS — I658 Occlusion and stenosis of other precerebral arteries: Secondary | ICD-10-CM | POA: Diagnosis not present

## 2014-08-08 DIAGNOSIS — I6529 Occlusion and stenosis of unspecified carotid artery: Secondary | ICD-10-CM | POA: Diagnosis not present

## 2014-08-08 DIAGNOSIS — I2510 Atherosclerotic heart disease of native coronary artery without angina pectoris: Secondary | ICD-10-CM | POA: Diagnosis not present

## 2014-08-08 DIAGNOSIS — I6523 Occlusion and stenosis of bilateral carotid arteries: Secondary | ICD-10-CM

## 2014-08-08 NOTE — Progress Notes (Signed)
Carotid duplex performed 

## 2014-08-31 ENCOUNTER — Ambulatory Visit: Payer: Medicare Other | Admitting: Cardiovascular Disease

## 2014-09-14 DIAGNOSIS — L821 Other seborrheic keratosis: Secondary | ICD-10-CM | POA: Diagnosis not present

## 2014-09-14 DIAGNOSIS — L820 Inflamed seborrheic keratosis: Secondary | ICD-10-CM | POA: Diagnosis not present

## 2014-09-21 ENCOUNTER — Encounter: Payer: Self-pay | Admitting: Family

## 2014-09-21 ENCOUNTER — Ambulatory Visit (INDEPENDENT_AMBULATORY_CARE_PROVIDER_SITE_OTHER): Payer: Medicare Other | Admitting: Family

## 2014-09-21 VITALS — BP 118/58 | HR 70 | Temp 98.1°F | Resp 16 | Ht 68.0 in | Wt 159.8 lb

## 2014-09-21 DIAGNOSIS — I2510 Atherosclerotic heart disease of native coronary artery without angina pectoris: Secondary | ICD-10-CM

## 2014-09-21 DIAGNOSIS — J0110 Acute frontal sinusitis, unspecified: Secondary | ICD-10-CM | POA: Diagnosis not present

## 2014-09-21 MED ORDER — DOXYCYCLINE HYCLATE 100 MG PO TABS: 100.0000 mg | ORAL_TABLET | Freq: Two times a day (BID) | ORAL | Status: DC

## 2014-09-21 NOTE — Progress Notes (Signed)
Pre visit review using our clinic review tool, if applicable. No additional management support is needed unless otherwise documented below in the visit note. 

## 2014-09-21 NOTE — Progress Notes (Signed)
Subjective:    Patient ID: [name] Christensen, male    DOB: 17-Sep-1932, 80 y.o.   MRN: [id number]  Chief Complaint  Patient presents with  . Chest congestion    x3 days, cough, sinus headache, doesn't want to get pneumonia    HPI:  [name] Christensen is a 80 y.o. male who presents today for congestion.   Acute onset of body aches, chills on and off, cough, stuffiness in the head, and scratchy throat that have been going on for about 3 days. Has slowly gotten worse over the last 3 days. There are no aggrevating or relieving factors. Has been trying Mucinex-D and Tussin cold and cough with minimal relief. Concerned it can develop into PNA.  Allergies  Allergen Reactions  . Ezetimibe     REACTION: numbness  . Niacin     REACTION: CP  . Penicillins Other (See Comments)    Blisters between fingers @ 15  . Pravastatin Sodium Other (See Comments)    Aches and pains in joints  . Rosuvastatin     REACTION: aches   Current Outpatient Prescriptions on File Prior to Visit  Medication Sig Dispense Refill  . alfuzosin (UROXATRAL) 10 MG 24 hr tablet Take 10 mg by mouth every evening.       [name] aspirin 81 MG chewable tablet Chew 1 tablet (81 mg total) by mouth daily.      [name] atenolol (TENORMIN) 50 MG tablet Take 25 mg by mouth daily. Take one tablet by mouth at night      . betamethasone dipropionate (DIPROLENE) 0.05 % cream Apply topically 2 (two) times daily.  30 g  3  . Cholecalciferol (VITAMIN D3) 1000 UNITS CAPS Take 1 capsule by mouth daily.        . clopidogrel (PLAVIX) 75 MG tablet Take 1 tablet (75 mg total) by mouth daily.  30 tablet  5  . clotrimazole-betamethasone (LOTRISONE) cream Apply 1 application topically 2 (two) times daily.  45 g  3  . diazepam (VALIUM) 5 MG tablet Take 0.5-1 tablets (2.5-5 mg total) by mouth every 12 (twelve) hours as needed.  30 tablet  3  . loratadine (CLARITIN) 10 MG tablet Take 1 tablet (10 mg total) by mouth daily.  100 tablet  3  . losartan (COZAAR) 50 MG  tablet Take 25 mg by mouth daily. Take 1 tablet every day.      . Multiple Vitamin (MULTIVITAMIN WITH MINERALS) TABS Take 1 tablet by mouth daily.      . nitroGLYCERIN (NITROSTAT) 0.4 MG SL tablet Place 1 tablet (0.4 mg total) under the tongue every 5 (five) minutes as needed. For chest pain  25 tablet  3  . ranitidine (ZANTAC) 150 MG capsule Take 150 mg by mouth as needed for heartburn.       . simvastatin (ZOCOR) 20 MG tablet Take 1 tablet (20 mg total) by mouth at bedtime.  90 tablet  3   No current facility-administered medications on file prior to visit.    Review of Systems    See HPI Objective:    BP 118/58  Pulse 70  Temp(Src) 98.1 F (36.7 C) (Oral)  Resp 16  Ht 5\' 8"  (1.727 m)  Wt 159 lb 12.8 oz (72.485 kg)  BMI 24.30 kg/m2  SpO2 96% Nursing note and vital signs reviewed.  Physical Exam  Constitutional: He is oriented to person, place, and time. He appears well-developed and well-nourished. No distress.  HENT:  Right Ear:  Hearing, tympanic membrane, external ear and ear canal normal.  Left Ear: Hearing, tympanic membrane, external ear and ear canal normal.  Nose: Right sinus exhibits frontal sinus tenderness. Right sinus exhibits no maxillary sinus tenderness. Left sinus exhibits frontal sinus tenderness. Left sinus exhibits no maxillary sinus tenderness.  Mouth/Throat: Uvula is midline, oropharynx is clear and moist and mucous membranes are normal.  Neck: Neck supple.  Cardiovascular: Normal rate, regular rhythm and normal heart sounds.   Pulmonary/Chest: Effort normal and breath sounds normal.  Lymphadenopathy:    He has no cervical adenopathy.  Neurological: He is alert and oriented to person, place, and time.  Skin: Skin is warm and dry.  Psychiatric: He has a normal mood and affect. His behavior is normal. Judgment and thought content normal.       Assessment & Plan:

## 2014-09-21 NOTE — Assessment & Plan Note (Signed)
Symptoms consistent with acute sinusitis, likely viral. Given current symptoms discussed with patient to continue taking OTC medications and avoid any products with -D. Given prescription for Doxycyline and discussed to start taking it if symptoms worsened or failed to improve. Pt in agreement with the plan. Will follow up as needed.

## 2014-09-21 NOTE — Patient Instructions (Signed)
Thank you for choosing [facility].  Summary/Instructions:   You have acute sinusitis. Right now it is potentially a viral infection, so please continue over the counter medications for symptom relief.   If your symptoms worsen or fail to improve by Friday - please start the antibiotic.   General Recommendations: Please drink plenty of fluids. Sleep in humidified air if possible. Use saline nasal sprays or the Netti pot as needed.   Medicaitons:  Flonase as needed to assist with congestion. You may use Mucinex to help break up congestion as you feel is needed. Tylenol as needed for fever. Coricidin HBP or congestion.

## 2014-10-18 ENCOUNTER — Encounter: Payer: Self-pay | Admitting: Cardiovascular Disease

## 2014-10-18 ENCOUNTER — Ambulatory Visit (INDEPENDENT_AMBULATORY_CARE_PROVIDER_SITE_OTHER): Payer: Medicare Other | Admitting: Cardiovascular Disease

## 2014-10-18 VITALS — BP 142/70 | HR 68 | Ht 68.0 in | Wt 157.1 lb

## 2014-10-18 DIAGNOSIS — I10 Essential (primary) hypertension: Secondary | ICD-10-CM

## 2014-10-18 DIAGNOSIS — E785 Hyperlipidemia, unspecified: Secondary | ICD-10-CM | POA: Diagnosis not present

## 2014-10-18 DIAGNOSIS — I2510 Atherosclerotic heart disease of native coronary artery without angina pectoris: Secondary | ICD-10-CM | POA: Diagnosis not present

## 2014-10-18 DIAGNOSIS — I469 Cardiac arrest, cause unspecified: Secondary | ICD-10-CM

## 2014-10-18 NOTE — Progress Notes (Signed)
Patient ID: [name] Christensen, male   DOB: 1932/09/22, 80 y.o.   MRN: [id number] [name] Christensen is a 80 y.o. male  Seen following discharge from the hospital in May because of the admission for chest pain and bradycardia for which he was found to have bigeminy. He had noted that he had had fatigue for the antecedent 6-8 weeks.  Dr. Renaldo Reel change his atenolol to carvedilol which has been associated with dizziness and headache.  He has a history of ischemic heart disease with prior bypass surgery. His cardiac enzymes were negative. He is status post ICD implantation-single chamber undertaken for aborted cardiac arrest.   Catheterization 12/13 demonstrating patent grafts with an atretic LIMA; LV function was normal  No angina or AICD d/c  Still working at [employer]: Denies fever, malais, weight loss, blurry vision, decreased visual acuity, cough, sputum, SOB, hemoptysis, pleuritic pain, palpitaitons, heartburn, abdominal pain, melena, lower extremity edema, claudication, or rash.  All other systems reviewed and negative  General: Affect appropriate Healthy:  appears stated age 37: normal Neck supple with no adenopathy JVP normal no bruits no thyromegaly Lungs clear with no wheezing and good diaphragmatic motion Heart:  S1/S2 no murmur, no rub, gallop or click  AICD under left clavicle  PMI normal Abdomen: benighn, BS positve, no tenderness, no AAA no bruit.  No HSM or HJR Distal pulses intact with no bruits No edema Neuro non-focal Skin warm and dry No muscular weakness   Current Outpatient Prescriptions  Medication Sig Dispense Refill  . alfuzosin (UROXATRAL) 10 MG 24 hr tablet Take 10 mg by mouth every evening.     [name] aspirin 81 MG chewable tablet Chew 1 tablet (81 mg total) by mouth daily.    [name] atenolol (TENORMIN) 50 MG tablet Take 25 mg by mouth daily. Take one tablet by mouth at night    . betamethasone dipropionate (DIPROLENE) 0.05 % cream Apply topically 2  (two) times daily. 30 g 3  . Cholecalciferol (VITAMIN D3) 1000 UNITS CAPS Take 1 capsule by mouth daily.      . clopidogrel (PLAVIX) 75 MG tablet Take 1 tablet (75 mg total) by mouth daily. 30 tablet 5  . clotrimazole-betamethasone (LOTRISONE) cream Apply 1 application topically 2 (two) times daily. 45 g 3  . diazepam (VALIUM) 5 MG tablet Take 0.5-1 tablets (2.5-5 mg total) by mouth every 12 (twelve) hours as needed. 30 tablet 3  . doxycycline (VIBRA-TABS) 100 MG tablet Take 1 tablet (100 mg total) by mouth 2 (two) times daily. 20 tablet 0  . loratadine (CLARITIN) 10 MG tablet Take 1 tablet (10 mg total) by mouth daily. 100 tablet 3  . losartan (COZAAR) 50 MG tablet Take 25 mg by mouth daily. Take 1 tablet every day.    . Multiple Vitamin (MULTIVITAMIN WITH MINERALS) TABS Take 1 tablet by mouth daily.    . nitroGLYCERIN (NITROSTAT) 0.4 MG SL tablet Place 1 tablet (0.4 mg total) under the tongue every 5 (five) minutes as needed. For chest pain 25 tablet 3  . ranitidine (ZANTAC) 150 MG capsule Take 150 mg by mouth as needed for heartburn.     . simvastatin (ZOCOR) 20 MG tablet Take 1 tablet (20 mg total) by mouth at bedtime. 90 tablet 3   No current facility-administered medications for this visit.    Allergies  Ezetimibe; Niacin; Penicillins; Pravastatin sodium; and Rosuvastatin  Electrocardiogram:  SR RBBB PVC    Assessment and Plan

## 2014-10-18 NOTE — Assessment & Plan Note (Signed)
Stable with no angina and good activity level.  Continue medical Rx  

## 2014-10-18 NOTE — Assessment & Plan Note (Signed)
Single chamber AICD  No d/c normal function F/U Dr [name]

## 2014-10-18 NOTE — Patient Instructions (Signed)
Your physician wants you to follow-up in: YEAR WITH DR NISHAN  You will receive a reminder letter in the mail two months in advance. If you don't receive a letter, please call our office to schedule the follow-up appointment.  Your physician recommends that you continue on your current medications as directed. Please refer to the Current Medication list given to you today. 

## 2014-10-18 NOTE — Assessment & Plan Note (Signed)
Cholesterol is at goal.  Continue current dose of statin and diet Rx.  No myalgias or side effects.  F/U  LFT's in 6 months. Lab Results  Component Value Date   LDLCALC 107* 08/03/2014

## 2014-10-21 ENCOUNTER — Other Ambulatory Visit: Payer: Self-pay | Admitting: *Deleted

## 2014-10-21 MED ORDER — NITROGLYCERIN 0.4 MG SL SUBL: 0.4000 mg | SUBLINGUAL_TABLET | SUBLINGUAL | Status: DC | PRN

## 2014-10-25 ENCOUNTER — Other Ambulatory Visit: Payer: Self-pay

## 2014-10-25 MED ORDER — DIAZEPAM 5 MG PO TABS: 2.5000 mg | ORAL_TABLET | Freq: Two times a day (BID) | ORAL | Status: DC | PRN

## 2014-10-31 ENCOUNTER — Ambulatory Visit (INDEPENDENT_AMBULATORY_CARE_PROVIDER_SITE_OTHER): Payer: Medicare Other | Admitting: Internal Medicine

## 2014-10-31 ENCOUNTER — Encounter: Payer: Self-pay | Admitting: Internal Medicine

## 2014-10-31 VITALS — BP 150/70 | HR 78 | Temp 98.1°F | Resp 20 | Ht 68.0 in | Wt 162.0 lb

## 2014-10-31 DIAGNOSIS — I2510 Atherosclerotic heart disease of native coronary artery without angina pectoris: Secondary | ICD-10-CM | POA: Diagnosis not present

## 2014-10-31 DIAGNOSIS — R1030 Lower abdominal pain, unspecified: Secondary | ICD-10-CM

## 2014-10-31 DIAGNOSIS — R1031 Right lower quadrant pain: Secondary | ICD-10-CM | POA: Insufficient documentation

## 2014-10-31 MED ORDER — CIPROFLOXACIN HCL 500 MG PO TABS: 500.0000 mg | ORAL_TABLET | Freq: Two times a day (BID) | ORAL | Status: DC

## 2014-10-31 MED ORDER — DIAZEPAM 5 MG PO TABS: 2.5000 mg | ORAL_TABLET | Freq: Two times a day (BID) | ORAL | Status: DC | PRN

## 2014-10-31 NOTE — Progress Notes (Signed)
Subjective:   Groin Pain This is a new problem. The current episode started 1 to 4 weeks ago. The problem occurs intermittently. The problem has been waxing and waning. The pain is severe. Pertinent negatives include no abdominal pain, chest pain, coughing, diarrhea, dysuria, frequency, nausea, rash or sore throat. The testicular pain affects the right testicle. The color of the testicles is normal. The symptoms are aggravated by activity. He has tried nothing for the symptoms.    The patient presents for a follow-up of  chronic BPH, hypertension, chronic dyslipidemia,CAD controlled with medicines   Kathie Rhodes - wife - [phone]  BP Readings from Last 3 Encounters:  10/31/14 150/70  10/18/14 142/70  09/21/14 118/58   Wt Readings from Last 3 Encounters:  10/31/14 162 lb (73.483 kg)  10/18/14 157 lb 1.9 oz (71.269 kg)  09/21/14 159 lb 12.8 oz (72.485 kg)      Review of Systems  Constitutional: Negative for appetite change, fatigue and unexpected weight change.  HENT: Negative for congestion, nosebleeds, sneezing, sore throat and trouble swallowing.   Eyes: Negative for itching and visual disturbance.  Respiratory: Negative for cough.   Cardiovascular: Negative for chest pain, palpitations and leg swelling.  Gastrointestinal: Negative for nausea, abdominal pain, diarrhea, blood in stool and abdominal distention.  Genitourinary: Negative for dysuria, frequency and hematuria.  Musculoskeletal: Negative for back pain, joint swelling, gait problem and neck pain.  Skin: Negative for rash.  Neurological: Negative for dizziness, tremors, speech difficulty and weakness.  Psychiatric/Behavioral: Negative for suicidal ideas, sleep disturbance, dysphoric mood and agitation. The patient is not nervous/anxious.   R testis is sensitive No hernia     Objective:   Physical Exam  Lab Results  Component Value Date   WBC 6.7 08/03/2014   HGB 13.2 08/03/2014   HCT 39.2 08/03/2014   PLT  124.0* 08/03/2014   GLUCOSE 84 08/03/2014   CHOL 166 08/03/2014   TRIG 68.0 08/03/2014   HDL 45.80 08/03/2014   LDLCALC 107* 08/03/2014   ALT 31 08/03/2014   AST 34 08/03/2014   NA 136 08/03/2014   K 4.6 08/03/2014   CL 102 08/03/2014   CREATININE 1.0 08/03/2014   BUN 15 08/03/2014   CO2 29 08/03/2014   TSH 1.41 08/03/2014   PSA 10.39* 08/03/2014   INR 1.32 11/19/2012   HGBA1C 6.3* 11/20/2012         Assessment & Plan:

## 2014-10-31 NOTE — Assessment & Plan Note (Signed)
12/15 ?epididymitis  Cipro

## 2014-10-31 NOTE — Progress Notes (Signed)
Pre visit review using our clinic review tool, if applicable. No additional management support is needed unless otherwise documented below in the visit note. 

## 2014-10-31 NOTE — Patient Instructions (Addendum)
Possible epididymitis Wear briefs Take Simvastatin 1/2 a day while on Cipro

## 2014-11-03 ENCOUNTER — Encounter (HOSPITAL_COMMUNITY): Payer: Self-pay | Admitting: Cardiology

## 2014-11-08 ENCOUNTER — Telehealth: Payer: Self-pay | Admitting: Internal Medicine

## 2014-11-08 ENCOUNTER — Other Ambulatory Visit: Payer: Self-pay | Admitting: Internal Medicine

## 2014-11-08 DIAGNOSIS — R1030 Lower abdominal pain, unspecified: Secondary | ICD-10-CM

## 2014-11-08 DIAGNOSIS — R1031 Right lower quadrant pain: Secondary | ICD-10-CM

## 2014-11-08 NOTE — Telephone Encounter (Signed)
Patient states he seen Dr. Camila Li last week for "ruptures".  He states he was to call back if he was still having issues.  Patient states he is having pain in right side still.  He would like to have xray done.  Please advise.

## 2014-11-08 NOTE — Telephone Encounter (Signed)
Pls sch w/Dr Tamala Julian this week Dx groin pain May need an office groin [place] Thx

## 2014-11-09 NOTE — Telephone Encounter (Signed)
Discussed with pt's wife. Pt will be seen by dr Tamala Julian tomorrow @12p .

## 2014-11-10 ENCOUNTER — Other Ambulatory Visit (INDEPENDENT_AMBULATORY_CARE_PROVIDER_SITE_OTHER): Payer: Medicare Other

## 2014-11-10 ENCOUNTER — Encounter: Payer: Self-pay | Admitting: Family Medicine

## 2014-11-10 ENCOUNTER — Ambulatory Visit (INDEPENDENT_AMBULATORY_CARE_PROVIDER_SITE_OTHER)
Admission: RE | Admit: 2014-11-10 | Discharge: 2014-11-10 | Disposition: A | Discharge status: Home / Self Care | Payer: Medicare Other | Source: Ambulatory Visit | Attending: Family Medicine | Admitting: Family Medicine

## 2014-11-10 ENCOUNTER — Ambulatory Visit (INDEPENDENT_AMBULATORY_CARE_PROVIDER_SITE_OTHER): Payer: Medicare Other | Admitting: Family Medicine

## 2014-11-10 VITALS — BP 124/62 | HR 64 | Ht 68.0 in | Wt 159.0 lb

## 2014-11-10 DIAGNOSIS — R1031 Right lower quadrant pain: Secondary | ICD-10-CM

## 2014-11-10 DIAGNOSIS — R1030 Lower abdominal pain, unspecified: Secondary | ICD-10-CM

## 2014-11-10 DIAGNOSIS — M1611 Unilateral primary osteoarthritis, right hip: Secondary | ICD-10-CM | POA: Diagnosis not present

## 2014-11-10 DIAGNOSIS — I2510 Atherosclerotic heart disease of native coronary artery without angina pectoris: Secondary | ICD-10-CM | POA: Diagnosis not present

## 2014-11-10 NOTE — Patient Instructions (Addendum)
Good to see you Ice could help Your hip does have arthritis but not giving you the pain.  Consider tylenol 650 mg up to 3 times a day.  Try some of your back braces.  See me when you need me.

## 2014-11-10 NOTE — Progress Notes (Signed)
  [name] [place],  [address] Phone: [phone] Subjective:    I'm seeing this patient by the request  of:  [name] Kehr, MD   CC: right hip pain.   [id number] [name] Christensen is a 80 y.o. male coming in with complaint of right hip pain. Patient was seen by primary care provider previously. Patient states that the pain is more of a dull aching pain that seems to be in the groin. Patient states it seems to be right-sided. Worse with activity but can even occur at rest. Patient does not remember any injury. Patient states it is more of a throbbing sensation that feels muscular. Patient points more towards the abdominal wall. Patient states he has tried to wear some compression shorts without any significant improvement. Patient denies any testicular pain. Patient does not feel any bulges. Denies any fevers chills or any abnormal weight loss. Patient was treated with anti-biotics. Rates pain as 7/10. Not stopping him from activity and not waking him up.   X-rays were ordered reviewed and interpreted by me today. X-rays of patient's right hip does show moderate to severe osteophytic changes mostly of the inferior aspect. Mild sclerotic changes.     Past medical history, social, surgical and family history all reviewed in electronic medical record.   Review of Systems: No headache, visual changes, nausea, vomiting, diarrhea, constipation, dizziness, abdominal pain, skin rash, fevers, chills, night sweats, weight loss, swollen lymph nodes, body aches, joint swelling, muscle aches, chest pain, shortness of breath, mood changes.   Objective Blood pressure 124/62, pulse 64, height 5\' 8"  (1.727 m), weight 159 lb (72.122 kg), SpO2 97 %.  General: No apparent distress alert and oriented x3 mood and affect normal, dressed appropriately.  HEENT: Pupils equal, extraocular movements intact  Respiratory: Patient's speak in full sentences and does not  appear short of breath  Cardiovascular: No lower extremity edema, non tender, no erythema  Skin: Warm dry intact with no signs of infection or rash on extremities or on axial skeleton.  Abdomen: Soft patient is tender in the inguinal canals bilaterally right greater than left. With increasing intra-abdominal pressure patient does have a hernia noted. Neuro: Cranial nerves II through XII are intact, neurovascularly intact in all extremities with 2+ DTRs and 2+ pulses.  Lymph: No lymphadenopathy of posterior or anterior cervical chain or axillae bilaterally.  Gait normal with good balance and coordination.  MSK:  Non tender with full range of motion and good stability and symmetric strength and tone of shoulders, elbows, wrist,  knee and ankles bilaterally.  Hip: ROM IR: 35 Deg, ER: 45 Deg, Flexion: 120 Deg, Extension: 100 Deg, Abduction: 45 Deg, Adduction: 45 Deg Strength IR: 5/5, ER: 5/5, Flexion: 5/5, Extension: 5/5, Abduction: 5/5, Adduction: 5/5 Pelvic alignment unremarkable to inspection and palpation. Standing hip rotation and gait without trendelenburg sign / unsteadiness. Greater trochanter without tenderness to palpation. No tenderness over piriformis and greater trochanter. No pain with FABER or FADIR. No SI joint tenderness and normal minimal SI movement.   Limited muscular skeletal ultrasound was performed and interpreted by Hulan Saas, M  Limited abdominal ultrasound shows the patient does have appears to be an inguinal hernia proximally 2.5 cm in diameter. Impression: Hernia   Impression and Recommendations:     This case required medical decision making of moderate complexity.

## 2014-11-10 NOTE — Assessment & Plan Note (Signed)
I believe the patient's right groin pain is actually secondary to an inguinal hernia. This is noted today. We discussed the benefits of the possible surgical intervention. Patient declined and would like to do more of a conservative therapy at this time. We discussed over-the-counter medications that could be beneficial. We discussed possibly trying to find any type of compression that could be helpful. I do not see any signs of epididymitis today. Patient's hip does have moderate to severe arthritis on imaging but on exam today he does not seem to be painful. Patient does have a history of significant low back pain and radicular symptoms cannot be ruled out today. Patient will discuss with primary care provider about the next steps. Patient knows if he has any worsening pain or would like to try a diagnostic as well as potentially therapeutic injection of the hip is more than welcome to come back. Otherwise patient can follow-up with me on an as-needed basis.

## 2014-11-11 ENCOUNTER — Ambulatory Visit: Payer: Medicare Other | Admitting: Internal Medicine

## 2014-11-15 ENCOUNTER — Encounter: Payer: Self-pay | Admitting: Internal Medicine

## 2014-11-25 ENCOUNTER — Encounter: Payer: Self-pay | Admitting: Internal Medicine

## 2014-11-28 ENCOUNTER — Other Ambulatory Visit: Payer: Self-pay | Admitting: Internal Medicine

## 2014-11-28 ENCOUNTER — Other Ambulatory Visit: Payer: Self-pay

## 2014-11-28 DIAGNOSIS — K4090 Unilateral inguinal hernia, without obstruction or gangrene, not specified as recurrent: Secondary | ICD-10-CM

## 2014-11-28 MED ORDER — ATENOLOL 50 MG PO TABS: 25.0000 mg | ORAL_TABLET | Freq: Every day | ORAL | Status: DC

## 2014-11-29 ENCOUNTER — Telehealth: Payer: Self-pay | Admitting: Internal Medicine

## 2014-11-29 NOTE — Telephone Encounter (Signed)
Pt called back and I read the message below, he has appt 1/6 and wants to keep appt with you.

## 2014-11-29 NOTE — Telephone Encounter (Signed)
Noted. Thx.

## 2014-11-29 NOTE — Telephone Encounter (Signed)
Email from last night:  Dear Mr [name] Christensen,  I'm sorry you are in pain!  It was actually Dr Tamala Julian who discovered your hernia on ultrasound. I was not able to feel the hernia.  I'll refer you to see a surgeon as soon as they can accommodate you. [facility] will contact you.  Thank you!  AP   Thx!

## 2014-11-29 NOTE — Telephone Encounter (Signed)
Pt sched OV for 1/6 for hernia issue. Pt also stated he sent email to Dr Camila Li but has not heard back. Pt requesting a call.  Pls call pt [phone]

## 2014-11-30 ENCOUNTER — Ambulatory Visit (INDEPENDENT_AMBULATORY_CARE_PROVIDER_SITE_OTHER)
Admission: RE | Admit: 2014-11-30 | Discharge: 2014-11-30 | Disposition: A | Discharge status: Home / Self Care | Payer: Medicare Other | Source: Ambulatory Visit | Attending: Internal Medicine | Admitting: Internal Medicine

## 2014-11-30 ENCOUNTER — Ambulatory Visit (INDEPENDENT_AMBULATORY_CARE_PROVIDER_SITE_OTHER): Payer: Medicare Other | Admitting: Internal Medicine

## 2014-11-30 ENCOUNTER — Encounter: Payer: Self-pay | Admitting: Internal Medicine

## 2014-11-30 ENCOUNTER — Other Ambulatory Visit (INDEPENDENT_AMBULATORY_CARE_PROVIDER_SITE_OTHER): Payer: Medicare Other

## 2014-11-30 VITALS — BP 144/80 | HR 91 | Temp 97.8°F | Resp 12 | Ht 68.0 in | Wt 159.0 lb

## 2014-11-30 DIAGNOSIS — R059 Cough, unspecified: Secondary | ICD-10-CM | POA: Insufficient documentation

## 2014-11-30 DIAGNOSIS — R1031 Right lower quadrant pain: Secondary | ICD-10-CM

## 2014-11-30 DIAGNOSIS — R300 Dysuria: Secondary | ICD-10-CM

## 2014-11-30 DIAGNOSIS — I517 Cardiomegaly: Secondary | ICD-10-CM | POA: Diagnosis not present

## 2014-11-30 DIAGNOSIS — R1030 Lower abdominal pain, unspecified: Secondary | ICD-10-CM

## 2014-11-30 DIAGNOSIS — K4090 Unilateral inguinal hernia, without obstruction or gangrene, not specified as recurrent: Secondary | ICD-10-CM

## 2014-11-30 DIAGNOSIS — R05 Cough: Secondary | ICD-10-CM

## 2014-11-30 DIAGNOSIS — Z79899 Other long term (current) drug therapy: Secondary | ICD-10-CM | POA: Diagnosis not present

## 2014-11-30 LAB — CBC WITH DIFFERENTIAL/PLATELET
Basophils Absolute: 0 10*3/uL (ref 0.0–0.1)
Basophils Relative: 0.5 % (ref 0.0–3.0)
Eosinophils Absolute: 0.2 10*3/uL (ref 0.0–0.7)
Eosinophils Relative: 2.6 % (ref 0.0–5.0)
HCT: 37 % — ABNORMAL LOW (ref 39.0–52.0)
Hemoglobin: 12.4 g/dL — ABNORMAL LOW (ref 13.0–17.0)
Lymphocytes Relative: 23.5 % (ref 12.0–46.0)
Lymphs Abs: 2.1 10*3/uL (ref 0.7–4.0)
MCHC: 33.7 g/dL (ref 30.0–36.0)
MCV: 90.3 fl (ref 78.0–100.0)
Monocytes Absolute: 0.6 10*3/uL (ref 0.1–1.0)
Monocytes Relative: 7.3 % (ref 3.0–12.0)
Neutro Abs: 5.8 10*3/uL (ref 1.4–7.7)
Neutrophils Relative %: 66.1 % (ref 43.0–77.0)
Platelets: 109 10*3/uL — ABNORMAL LOW (ref 150.0–400.0)
RBC: 4.09 Mil/uL — ABNORMAL LOW (ref 4.22–5.81)
RDW: 13.5 % (ref 11.5–15.5)
WBC: 8.8 10*3/uL (ref 4.0–10.5)

## 2014-11-30 LAB — BASIC METABOLIC PANEL
BUN: 13 mg/dL (ref 6–23)
CO2: 27 mEq/L (ref 19–32)
Calcium: 10.1 mg/dL (ref 8.4–10.5)
Chloride: 101 mEq/L (ref 96–112)
Creatinine, Ser: 1.1 mg/dL (ref 0.4–1.5)
GFR: 69.16 mL/min (ref >60.00)
Glucose, Bld: 114 mg/dL — ABNORMAL HIGH (ref 70–99)
Potassium: 4.4 mEq/L (ref 3.5–5.1)
Sodium: 132 mEq/L — ABNORMAL LOW (ref 135–145)

## 2014-11-30 LAB — POCT URINALYSIS DIPSTICK
Bilirubin, UA: NEGATIVE
Blood, UA: NEGATIVE
Glucose, UA: NEGATIVE
Ketones, UA: 1000
Leukocytes, UA: NEGATIVE
Nitrite, UA: NEGATIVE
Protein, UA: 15
Spec Grav, UA: 1.015
Urobilinogen, UA: NEGATIVE
pH, UA: 6

## 2014-11-30 LAB — SEDIMENTATION RATE: Sed Rate: 18 mm/hr (ref 0–22)

## 2014-11-30 NOTE — Assessment & Plan Note (Signed)
Surg consult is pending CT offered and declined  UA

## 2014-11-30 NOTE — Assessment & Plan Note (Signed)
1/16 - hernia Surg consult is pending CT offered and declined  UA

## 2014-11-30 NOTE — Progress Notes (Signed)
Subjective:   Groin Pain This is a new problem. The current episode started 1 to 4 weeks ago. The problem occurs intermittently. The problem has been waxing and waning. The pain is severe. Pertinent negatives include no abdominal pain, chest pain, coughing, diarrhea, dysuria, frequency, nausea, rash or sore throat. The testicular pain affects the right testicle. The color of the testicles is normal. The symptoms are aggravated by activity. He has tried nothing for the symptoms.  C/o chills, achyness; off statin x 1 wk - not better. R testicle hurts. Abx Cipro did not help this   The patient presents for a follow-up of  chronic BPH, hypertension, chronic dyslipidemia,CAD controlled with medicines   Kathie Rhodes - wife - [phone]  BP Readings from Last 3 Encounters:  11/30/14 144/80  11/10/14 124/62  10/31/14 150/70   Wt Readings from Last 3 Encounters:  11/30/14 159 lb (72.122 kg)  11/10/14 159 lb (72.122 kg)  10/31/14 162 lb (73.483 kg)      Review of Systems  Constitutional: Negative for appetite change, fatigue and unexpected weight change.  HENT: Negative for congestion, nosebleeds, sneezing, sore throat and trouble swallowing.   Eyes: Negative for itching and visual disturbance.  Respiratory: Negative for cough.   Cardiovascular: Negative for chest pain, palpitations and leg swelling.  Gastrointestinal: Negative for nausea, abdominal pain, diarrhea, blood in stool and abdominal distention.  Genitourinary: Negative for dysuria, frequency and hematuria.  Musculoskeletal: Negative for back pain, joint swelling, gait problem and neck pain.  Skin: Negative for rash.  Neurological: Negative for dizziness, tremors, speech difficulty and weakness.  Psychiatric/Behavioral: Negative for suicidal ideas, sleep disturbance, dysphoric mood and agitation. The patient is not nervous/anxious.    R testis is sensitive  Now I can feel a hernia in the R groin     Objective:   Physical  Exam  Lab Results  Component Value Date   WBC 6.7 08/03/2014   HGB 13.2 08/03/2014   HCT 39.2 08/03/2014   PLT 124.0* 08/03/2014   GLUCOSE 84 08/03/2014   CHOL 166 08/03/2014   TRIG 68.0 08/03/2014   HDL 45.80 08/03/2014   LDLCALC 107* 08/03/2014   ALT 31 08/03/2014   AST 34 08/03/2014   NA 136 08/03/2014   K 4.6 08/03/2014   CL 102 08/03/2014   CREATININE 1.0 08/03/2014   BUN 15 08/03/2014   CO2 29 08/03/2014   TSH 1.41 08/03/2014   PSA 10.39* 08/03/2014   INR 1.32 11/15/2012   HGBA1C 6.3* 11/17/2012         Assessment & Plan:

## 2014-11-30 NOTE — Patient Instructions (Signed)
Go to ER if problems 

## 2014-11-30 NOTE — Assessment & Plan Note (Signed)
1/16 x 2 wks ?viral CXR

## 2014-12-08 DIAGNOSIS — N401 Enlarged prostate with lower urinary tract symptoms: Secondary | ICD-10-CM | POA: Diagnosis not present

## 2014-12-08 DIAGNOSIS — R972 Elevated prostate specific antigen [PSA]: Secondary | ICD-10-CM | POA: Diagnosis not present

## 2014-12-12 ENCOUNTER — Other Ambulatory Visit (INDEPENDENT_AMBULATORY_CARE_PROVIDER_SITE_OTHER): Payer: Self-pay | Admitting: Surgery

## 2014-12-12 ENCOUNTER — Other Ambulatory Visit (INDEPENDENT_AMBULATORY_CARE_PROVIDER_SITE_OTHER): Payer: Self-pay

## 2014-12-12 DIAGNOSIS — R109 Unspecified abdominal pain: Secondary | ICD-10-CM

## 2014-12-12 DIAGNOSIS — R1033 Periumbilical pain: Secondary | ICD-10-CM | POA: Diagnosis not present

## 2014-12-12 DIAGNOSIS — R194 Change in bowel habit: Secondary | ICD-10-CM

## 2014-12-12 DIAGNOSIS — K4020 Bilateral inguinal hernia, without obstruction or gangrene, not specified as recurrent: Secondary | ICD-10-CM | POA: Diagnosis not present

## 2014-12-12 NOTE — H&P (Signed)
[name] Christensen 12/12/2014 2:51 PM Location: [facility] Patient #: [id number] DOB: 11/23/1932 Married / Language: Cleophus Molt / Race: White Male History of Present Illness [name] Hector MD; 12/12/2014 5:08 PM) Patient words: RIH.  The patient is a 81 year old male who presents with an inguinal hernia. Patient sent by primary care physician Dr. Alain Marion, for concern of right inguinal hernia on exam and ultrasound.  Pleasant and active male. Comes today with his wife. Has had a history of coronary disease with bypass graft. Then had an episode of sudden cardiac death requiring resuscitation but recovered. Had ICD implantation. [employer]. Followed by cardiology. No events. Walks a treadmill to miles a day. Patient is chronically anticoagulated on Plavix. No major bleeding events.  Patient noted some bulging and discomfort on right side. Physical exam concerning for inguinal hernia. Primary care physician concerning prolapse of a small left inguinal hernia as well. Surgical consultation requested. Patient has had some intermittent abdominal pains and some some discomfort with eating. Normally has BM every day. Has had increased constipation. Taking low-dose Metamucil without much help. Suggestions made for CAT scan but patient wished to hold off. However the pain persists in concerns him. He's had some discomfort in the groin hernia on the right side but this is not like that. Wishes to reconsider CAT scan. Had open appendectomy at age 89. No other surgeries. Does have a large prostate and mildly elevated PSA.. Followed by urologist in [place]. No major urinary issues. Patient had stool studies done which were negative by PCR. Colonoscopy held off. Other Problems Davy Pique Bynum, CMA; 12/12/2014 2:52 PM) Back Pain Congestive Heart Failure Gastroesophageal Reflux Disease General anesthesia - complications High blood pressure Hypercholesterolemia Myocardial  infarction Other disease, cancer, significant illness  Past Surgical History Marjean Donna, CMA; 12/12/2014 2:52 PM) Appendectomy Coronary Artery Bypass Graft  Diagnostic Studies History Marjean Donna, CMA; 12/12/2014 2:52 PM) Colonoscopy within last year  Allergies Davy Pique Bynum, CMA; 12/12/2014 2:54 PM) Niacin (Antihyperlipidemic) *ANTIHYPERLIPIDEMICS* Penicillamine *ASSORTED CLASSES* Pravastatin Sodium *ANTIHYPERLIPIDEMICS*  Medication History (Sonya Bynum, CMA; 12/12/2014 2:53 PM) Alfuzosin HCl ER (10MG  Tablet ER 24HR, Oral) Active. Diazepam (5MG  Tablet, Oral) Active. Atenolol (50MG  Tablet, Oral) Active. Clopidogrel Bisulfate (75MG  Tablet, Oral) Active. Losartan Potassium (50MG  Tablet, Oral) Active. Ketoconazole (2% Cream, External) Active. Nitrostat (0.4MG  Tab Sublingual, Sublingual as needed) Active. Simvastatin (20MG  Tablet, Oral) Active. Mometasone Furoate (0.1% Cream, External) Active.  Social History ([place]; 12/12/2014 2:52 PM) Caffeine use Coffee. No alcohol use No drug use Tobacco use Never smoker.  Family History Marjean Donna, CMA; 12/12/2014 2:52 PM) Heart Disease Father, Mother. Heart disease in male family member before age 23 Hypertension Father, Mother. Respiratory Condition Father.     Review of Systems ([place]; 12/12/2014 2:52 PM) General Present- Chills. Not Present- Appetite Loss, Fatigue, Fever, Night Sweats, Weight Gain and Weight Loss. HEENT Present- Wears glasses/contact lenses. Not Present- Earache, Hearing Loss, Hoarseness, Nose Bleed, Oral Ulcers, Ringing in the Ears, Seasonal Allergies, Sinus Pain, Sore Throat, Visual Disturbances and Yellow Eyes. Gastrointestinal Present- Abdominal Pain. Not Present- Bloating, Bloody Stool, Change in Bowel Habits, Chronic diarrhea, Constipation, Difficulty Swallowing, Excessive gas, Gets full quickly at meals, Hemorrhoids, Indigestion, Nausea, Rectal Pain and  Vomiting. Musculoskeletal Present- Back Pain. Not Present- Joint Pain, Joint Stiffness, Muscle Pain, Muscle Weakness and Swelling of Extremities. Hematology Present- Easy Bruising. Not Present- Excessive bleeding, Gland problems, HIV and Persistent Infections.  Vitals (Sonya Bynum CMA; 12/12/2014 2:52 PM) 12/12/2014 2:52 PM Weight: 153 lb  Height: 66in Body Surface Area: 1.8 m Body Mass Index: 24.69 kg/m Temp.: 98.62F(Temporal)  Pulse: 51 (Regular)  BP: 152/94 (Sitting, Left Arm, Standard)     Physical Exam [name] Hector MD; 12/12/2014 3:37 PM)  General Mental Status-Alert. General Appearance-Not in acute distress, Not Sickly. Orientation-Oriented X3. Hydration-Well hydrated. Voice-Normal.  Integumentary Global Assessment Upon inspection and palpation of skin surfaces of the - Axillae: non-tender, no inflammation or ulceration, no drainage. and Distribution of scalp and body hair is normal. General Characteristics Temperature - normal warmth is noted.  Head and Neck Head-normocephalic, atraumatic with no lesions or palpable masses. Face Global Assessment - atraumatic, no absence of expression. Neck Global Assessment - no abnormal movements, no bruit auscultated on the right, no bruit auscultated on the left, no decreased range of motion, non-tender. Trachea-midline. Thyroid Gland Characteristics - non-tender.  Eye Eyeball - Left-Extraocular movements intact, No Nystagmus. Eyeball - Right-Extraocular movements intact, No Nystagmus. Cornea - Left-No Hazy. Cornea - Right-No Hazy. Sclera/Conjunctiva - Left-No scleral icterus, No Discharge. Sclera/Conjunctiva - Right-No scleral icterus, No Discharge. Pupil - Left-Direct reaction to light normal. Pupil - Right-Direct reaction to light normal.  ENMT Ears Pinna - Left - no drainage observed, no generalized tenderness observed. Right - no drainage observed, no generalized  tenderness observed. Nose and Sinuses External Inspection of the Nose - no destructive lesion observed. Inspection of the nares - Left - quiet respiration. Right - quiet respiration. Mouth and Throat Lips - Upper Lip - no fissures observed, no pallor noted. Lower Lip - no fissures observed, no pallor noted. Nasopharynx - no discharge present. Oral Cavity/Oropharynx - Tongue - no dryness observed. Oral Mucosa - no cyanosis observed. Hypopharynx - no evidence of airway distress observed.  Chest and Lung Exam Inspection Movements - Normal and Symmetrical. Accessory muscles - No use of accessory muscles in breathing. Palpation Palpation of the chest reveals - Non-tender. Auscultation Breath sounds - Normal and Clear.  Cardiovascular Auscultation Rhythm - Regular. Murmurs & Other Heart Sounds - Auscultation of the heart reveals - No Murmurs and No Systolic Clicks.  Abdomen Inspection Inspection of the abdomen reveals - No Visible peristalsis and No Abnormal pulsations. Umbilicus - No Bleeding, No Urine drainage. Palpation/Percussion Palpation and Percussion of the abdomen reveal - Soft, Non Tender, No Rebound tenderness, No Rigidity (guarding) and No Cutaneous hyperesthesia.  Male Genitourinary Sexual Maturity Tanner 5 - Adult hair pattern and Adult penile size and shape. Note: Normal external male genitalia. Testes mildly atrophic but normal range for 81 year old.  Obvious right inguinal hernia. Left inguinal hernia present on Valsalva. Both seemed to be indirect.   Peripheral Vascular Upper Extremity Inspection - Left - No Cyanotic nailbeds, Not Ischemic. Right - No Cyanotic nailbeds, Not Ischemic.  Neurologic Neurologic evaluation reveals -normal attention span and ability to concentrate, able to name objects and repeat phrases. Appropriate fund of knowledge , normal sensation and normal coordination. Mental Status Affect - not angry, not paranoid. Cranial Nerves-Normal  Bilaterally. Gait-Normal.  Neuropsychiatric Mental status exam performed with findings of-able to articulate well with normal speech/language, rate, volume and coherence, thought content normal with ability to perform basic computations and apply abstract reasoning and no evidence of hallucinations, delusions, obsessions or homicidal/suicidal ideation.  Musculoskeletal Global Assessment Spine, Ribs and Pelvis - no instability, subluxation or laxity. Right Upper Extremity - no instability, subluxation or laxity.  Lymphatic Head & Neck  General Head & Neck Lymphatics: Bilateral - Description - No Localized lymphadenopathy. Axillary  General Axillary Region:  Bilateral - Description - No Localized lymphadenopathy. Femoral & Inguinal  Generalized Femoral & Inguinal Lymphatics: Left - Description - No Localized lymphadenopathy. Right - Description - No Localized lymphadenopathy.    Assessment & Plan [name] Hector MD; 12/12/2014 3:42 PM)  BILATERAL INGUINAL HERNIA WITHOUT OBSTRUCTION OR GANGRENE, RECURRENCE NOT SPECIFIED (550.92  K40.20) Impression: Right greater than left inguinal hernias. I think he would benefit from repair. We plan laparoscopic approach. He and his wife are hoping to have it done before middle February when patient's wife needs I surgery.  Patient does have significant cardiac history but has excellent exercise tolerance. Followed by his cardiologist closely. Reasonable operative risks. He strongly prefers  if possible given his allegiance was the cardiologists there. That is reasonable. Would like cardiac clearance to make sure it is safe to come off the Plavix and also perioperative management of the [facility] defibrillator  Current Plans Schedule for Surgery Advised patient to stop ASA, anticoagulant, blood thinners, and NSAIDs Three (3) days prior to surgery. Pt Education - [facility] Hernia Post-Op HCI (Arne Schlender): discussed with patient and provided  information. Pt Education - [facility] Good Bowel Health (Dequita Schleicher) Pt Education - [facility] Pain Control ([place]) The anatomy & physiology of the abdominal wall and pelvic floor was discussed. The pathophysiology of hernias in the inguinal and pelvic region was discussed. Natural history risks such as progressive enlargement, pain, incarceration, and strangulation was discussed. Contributors to complications such as smoking, obesity, diabetes, prior surgery, etc were discussed.  I feel the risks of no intervention will lead to serious problems that outweigh the operative risks; therefore, I recommended surgery to reduce and repair the hernia. I explained laparoscopic techniques with possible need for an open approach. I noted usual use of mesh to patch and/or buttress hernia repair  Risks such as bleeding, infection, abscess, need for further treatment, heart attack, death, and other risks were discussed. I noted a good likelihood this will help address the problem. Goals of post-operative recovery were discussed as well. Possibility that this will not correct all symptoms was explained. I stressed the importance of low-impact activity, aggressive pain control, avoiding constipation, & not pushing through pain to minimize risk of post-operative chronic pain or injury. Possibility of reherniation was discussed. We will work to minimize complications.  An educational handout further explaining the pathology & treatment options was given as well. Questions were answered. The patient expresses understanding & wishes to proceed with surgery. PERIUMBILICAL ABDOMINAL PAIN (789.05  R10.33) Impression: Uncertain etiology.  Because of his intermittent crampy abdominal pains and change in bowel habits, he now wishes to proceed with considering a CAT scan. We will obtain a before surgery to make sure there are no surprises. Colon PCR test was normal. Hold off on colonoscopy for now. That would be another consideration.  I am  skeptical that the inguinal hernias would explain his abdominal pain which is separate from that region.

## 2014-12-13 ENCOUNTER — Inpatient Hospital Stay: Admission: RE | Admit: 2014-12-13 | Payer: Medicare Other | Source: Ambulatory Visit

## 2014-12-13 ENCOUNTER — Ambulatory Visit
Admission: RE | Admit: 2014-12-13 | Discharge: 2014-12-13 | Disposition: A | Discharge status: Home / Self Care | Payer: Medicare Other | Source: Ambulatory Visit | Attending: Surgery | Admitting: Surgery

## 2014-12-13 ENCOUNTER — Other Ambulatory Visit: Payer: Self-pay | Admitting: Surgery

## 2014-12-13 DIAGNOSIS — R109 Unspecified abdominal pain: Secondary | ICD-10-CM

## 2014-12-13 DIAGNOSIS — K4090 Unilateral inguinal hernia, without obstruction or gangrene, not specified as recurrent: Secondary | ICD-10-CM | POA: Diagnosis not present

## 2014-12-13 DIAGNOSIS — N400 Enlarged prostate without lower urinary tract symptoms: Secondary | ICD-10-CM | POA: Diagnosis not present

## 2014-12-13 DIAGNOSIS — K5900 Constipation, unspecified: Secondary | ICD-10-CM | POA: Diagnosis not present

## 2014-12-13 DIAGNOSIS — K7689 Other specified diseases of liver: Secondary | ICD-10-CM | POA: Diagnosis not present

## 2014-12-13 MED ORDER — IOHEXOL 300 MG/ML  SOLN
100.0000 mL | Freq: Once | INTRAMUSCULAR | Status: AC | PRN
  Administered 2014-12-13: 100 mL via INTRAVENOUS

## 2014-12-19 ENCOUNTER — Telehealth: Payer: Self-pay | Admitting: Internal Medicine

## 2014-12-19 DIAGNOSIS — T827XXA Infection and inflammatory reaction due to other cardiac and vascular devices, implants and grafts, initial encounter: Secondary | ICD-10-CM

## 2014-12-19 NOTE — Telephone Encounter (Signed)
lmtcb

## 2014-12-19 NOTE — Telephone Encounter (Signed)
Pt rtn call, pls call [phone]

## 2014-12-19 NOTE — Telephone Encounter (Signed)
New Message  Pt requested to speak w/ rn about his surgical clearance. Pt does not have surgery scheduled yet.  Please call back and discuss.

## 2014-12-19 NOTE — Telephone Encounter (Signed)
Calling wanting to know if could be seen this week.  Has to have bilateral hernia repair in groin and won't schedule until cleared by Dr. [name].  Was told that he had to be seen by Dr. [name] before being cleared.  Surgery is with Dr. Alwyn Pea at [facility]. He has an appointment on 2/1 for regular check w/Dr. [name] but was wanting to know if could be seen this week. Looked at schedule but he does not have any appointments available. He is having pain in groin and has to be taken off Plavix before surgery could be scheduled.  Advised will send to Alver Sorrow and Lorenda Hatchet, scheduler for Dr. [name].

## 2014-12-21 ENCOUNTER — Ambulatory Visit (INDEPENDENT_AMBULATORY_CARE_PROVIDER_SITE_OTHER): Payer: Medicare Other | Admitting: Internal Medicine

## 2014-12-21 ENCOUNTER — Encounter: Payer: Self-pay | Admitting: Internal Medicine

## 2014-12-21 VITALS — BP 148/82 | HR 73 | Ht 68.0 in | Wt 160.0 lb

## 2014-12-21 DIAGNOSIS — I259 Chronic ischemic heart disease, unspecified: Secondary | ICD-10-CM | POA: Diagnosis not present

## 2014-12-21 DIAGNOSIS — I493 Ventricular premature depolarization: Secondary | ICD-10-CM

## 2014-12-21 DIAGNOSIS — Z4502 Encounter for adjustment and management of automatic implantable cardiac defibrillator: Secondary | ICD-10-CM

## 2014-12-21 DIAGNOSIS — Z95810 Presence of automatic (implantable) cardiac defibrillator: Secondary | ICD-10-CM | POA: Diagnosis not present

## 2014-12-21 DIAGNOSIS — I469 Cardiac arrest, cause unspecified: Secondary | ICD-10-CM | POA: Diagnosis not present

## 2014-12-21 LAB — MDC_IDC_ENUM_SESS_TYPE_INCLINIC
Battery Remaining Longevity: 81.6 mo
Brady Statistic RV Percent Paced: 0.32 %
Date Time Interrogation Session: 20160127173644
HighPow Impedance: 72 Ohm
Implantable Pulse Generator Serial Number: 7048849
Lead Channel Impedance Value: 375 Ohm
Lead Channel Pacing Threshold Amplitude: 1.25 V
Lead Channel Pacing Threshold Amplitude: 1.25 V
Lead Channel Pacing Threshold Pulse Width: 0.8 ms
Lead Channel Pacing Threshold Pulse Width: 0.8 ms
Lead Channel Sensing Intrinsic Amplitude: 3.7 mV
Lead Channel Setting Pacing Amplitude: 2.5 V
Lead Channel Setting Pacing Pulse Width: 0.8 ms
Lead Channel Setting Sensing Sensitivity: 0.5 mV
Zone Setting Detection Interval: 250 ms
Zone Setting Detection Interval: 300 ms

## 2014-12-21 NOTE — Patient Instructions (Addendum)
Your physician recommends that you continue on your current medications as directed. Please refer to the Current Medication list given to you today.  Your physician recommends that you return for lab work for Blood cultures.  Lien Lyman, RN will call you to arrange this labwork.  Remote monitoring is used to monitor your Pacemaker of ICD from home. This monitoring reduces the number of office visits required to check your device to one time per year. It allows [place] to keep an eye on the functioning of your device to ensure it is working properly. You are scheduled for a device check from home on 03/22/15. You may send your transmission at any time that day. If you have a wireless device, the transmission will be sent automatically. After your physician reviews your transmission, you will receive a postcard with your next transmission date.  Your physician wants you to follow-up in: 1 year with Dr. [name].  You will receive a reminder letter in the mail two months in advance. If you don't receive a letter, please call our office to schedule the follow-up appointment.

## 2014-12-21 NOTE — Progress Notes (Signed)
2015     Patient Care Team: Tresa Garter, MD as PCP - General (Internal Medicine) Wendall Stade, MD as Attending Physician (Cardiology)   HPI  [name] Christensen is a 81 y.o. male Seen follow up for ICD implantation-single chamber undertaken for aborted cardiac arrest. Catheterization 12/13 demonstrating patent grafts with an atretic LIMA; LV function was normal S/p CABG  He was seen last month is noted to have significant ectopy with a pattern of bigeminy. Previous echo had demonstrated an ejection fraction of 45% in 2013. Holter demonstrated approximately 13% PVCs  Echo 2015 February EF 45-50%  He denies impairment in exercise intolerance and shortness of breath or chest pain.  He has had over the last couple of months chills and has been treated for infection of his "sinuses" a couple of times with antibiotics most recently in October.  He is to have hernia surgery .    Past Medical History  Diagnosis Date  . Coronary artery disease     a. s/p MI/CABG 2005. b. s/p cath at time of VF arrest 10/2013 - grafts patent.  [name] GERD (gastroesophageal reflux disease)   . Hyperlipidemia   . Hypertension   . BPH (benign prostatic hypertrophy)     elevated PSA Dr. Lindell Christensen Bx 2010  . RBBB   . Cardiac arrest     a. out-of-hospital arrest 10/2013 - EF 40-45%, patent grafts on cath, received St. [name] Christensen AICD.  [name] Ventricular bigeminy     a. Event monitor 01/2013: NSR with PVCs and occ bigeminy.  . Elevated LFTs     a. 10/2012 felt due to cardiac arrest - Hepatitis C Ab reactive from 11/20/2012>>Hep C RNA PCR negative 11/18/2012.   . Carotid disease, bilateral     a. 0-39% by doppers.    Past Surgical History  Procedure Laterality Date  . Cardiac catheterization  2007    with patent graft anatomy atretic left internal mammary  artery to the LAD which is nonobstructive. Will restart study June 08, 2007  . Coronary artery bypass graft  2005  . Lumbar fusion  09/2007  . Cardiac  defibrillator placement  dec 2013    Dr. Graciela Christensen, [facility]. [name] Christensen  . Appendectomy    . Left heart catheterization with coronary/graft angiogram N/A 11/16/2012    Procedure: LEFT HEART CATHETERIZATION WITH [name] Christensen;  Surgeon: [name] M [place], MD;  Location: [facility] CATH LAB;  Service: Cardiovascular;  Laterality: N/A;  . Implantable cardioverter defibrillator implant N/A 10/29/2012    Procedure: IMPLANTABLE CARDIOVERTER DEFIBRILLATOR IMPLANT;  Surgeon: [name] Salvia, MD;  Location[facility] C[facility] LAB;  Service: Cardiovascular;  Laterality: N/A;    Current Outpatient Prescriptions  Medication Sig Dispense Refill  . alfuzosin (UROXATRAL) 10 MG 24 hr tablet Take 10 mg by mouth every evening.     [name] aspirin 81 MG chewable tablet Chew 1 tablet (81 mg total) by mouth daily.    [name] atenolol (TENORMIN) 50 MG tablet Take 0.5 tablets (25 mg total) by mouth daily. Take one tablet by mouth at night 30 tablet 5  . betamethasone dipropionate (DIPROLENE) 0.05 % cream Apply topically 2 (two) times daily. 30 g 3  . Cholecalciferol (VITAMIN D3) 1000 UNITS CAPS Take 1 capsule by mouth daily.      . clopidogrel (PLAVIX) 75 MG tablet Take 1 tablet (75 mg total) by mouth daily. 30 tablet 5  . clotrimazole-betamethasone (LOTRISONE) cream Apply 1 application topically 2 (two) times daily. 45 g 3  . diazepam (  VALIUM) 5 MG tablet Take 0.5-1 tablets (2.5-5 mg total) by mouth every 12 (twelve) hours as needed for anxiety. 30 tablet 3  . losartan (COZAAR) 50 MG tablet Take 25 mg by mouth daily. Take 1 tablet every day.    . Multiple Vitamin (MULTIVITAMIN WITH MINERALS) TABS Take 1 tablet by mouth daily.    . nitroGLYCERIN (NITROSTAT) 0.4 MG SL tablet Place 1 tablet (0.4 mg total) under the tongue every 5 (five) minutes as needed. For chest pain 25 tablet 3  . ranitidine (ZANTAC) 150 MG capsule Take 150 mg by mouth as needed for heartburn.     . simvastatin (ZOCOR) 20 MG tablet Take 1 tablet (20 mg total) by mouth at bedtime. 90  tablet 3   No current facility-administered medications for this visit.    Allergies  Allergen Reactions  . Ezetimibe     REACTION: numbness  . Niacin     REACTION: CP  . Penicillins Other (See Comments)    Blisters between fingers @ 15  . Pravastatin Sodium Other (See Comments)    Aches and pains in joints  . Rosuvastatin     REACTION: aches    Review of Systems negative except from HPI and PMH  Physical Exam BP 148/82 mmHg  Pulse 73  Ht 5\' 8"  (1.727 m)  Wt 160 lb (72.576 kg)  BMI 24.33 kg/m2 Well developed and well nourished in no acute distress HENT normal E scleral and icterus clear Neck Supple JVP flat; carotids brisk and full Clear to ausculation Regularly irRegular rate and rhythm, no murmurs gallops or rub Soft with active bowel sounds No clubbing cyanosis none Edema Alert and oriented, grossly normal motor and sensory function Skin Warm and Dry  Holter was reviewed and demonstrated 10-15% PVCs with a morphology previously identified as right bundle branch superior axis  ECG SR 73 24/17/42 RBBB  PVC RBBBSup Axos  Assessment and  Plan  PVCs-right bundle branch superior axis high burden as noted. In the absence of symptoms, intervention would be directed by impact on left ventricular function. We should recheck echo  Cardiomyopathy ejection fraction 45% 2013 As above   History of aborted sudden cardiac death continue current meds  Implantable defibrillator-St. [name] Christensen The patient's device was interrogated.  The information was reviewed. No changes were made in the programming.    Ischemic cardiomyopathy with prior CABG continue current meds  Without symptoms of ischemia  Preoperative evaluation  Chills  From a cardiovascular point of view, [name] is at acceptable risk for hernia surgery     The major other issue, to be determined PRIOR to surgery, would be is their evidence for systemic infection potentially involving the device as suggested by  chills.  There is no skin evidence of infection  We will check blood cultures

## 2014-12-22 ENCOUNTER — Other Ambulatory Visit (INDEPENDENT_AMBULATORY_CARE_PROVIDER_SITE_OTHER): Payer: Medicare Other | Admitting: *Deleted

## 2014-12-22 ENCOUNTER — Other Ambulatory Visit: Payer: Self-pay | Admitting: Internal Medicine

## 2014-12-22 ENCOUNTER — Other Ambulatory Visit: Payer: Medicare Other

## 2014-12-22 DIAGNOSIS — T827XXA Infection and inflammatory reaction due to other cardiac and vascular devices, implants and grafts, initial encounter: Secondary | ICD-10-CM | POA: Diagnosis not present

## 2014-12-22 NOTE — Telephone Encounter (Signed)
New problem   Pt need to know where he need to go for his labs. Please call pt.

## 2014-12-22 NOTE — Telephone Encounter (Signed)
Arranged for blood cultures to be drawn at [facility] office. Patient verbalized understanding and agreeable to plan.

## 2014-12-22 NOTE — Telephone Encounter (Signed)
Follow Up  Pt called back. Where is his lab appointment. Please call

## 2014-12-26 ENCOUNTER — Encounter: Payer: Medicare Other | Admitting: Internal Medicine

## 2014-12-27 ENCOUNTER — Other Ambulatory Visit: Payer: Self-pay | Admitting: *Deleted

## 2014-12-27 ENCOUNTER — Encounter: Payer: Self-pay | Admitting: Internal Medicine

## 2014-12-27 MED ORDER — LOSARTAN POTASSIUM 50 MG PO TABS: 25.0000 mg | ORAL_TABLET | Freq: Every day | ORAL | Status: DC

## 2014-12-28 ENCOUNTER — Encounter: Payer: Self-pay | Admitting: Internal Medicine

## 2014-12-28 LAB — CULTURE, BLOOD (SINGLE): Organism ID, Bacteria: NO GROWTH

## 2014-12-29 ENCOUNTER — Telehealth: Payer: Self-pay | Admitting: Internal Medicine

## 2014-12-29 NOTE — Telephone Encounter (Signed)
Follow Up ° ° ° °Pt calling to follow up on test results. Please call. °

## 2014-12-29 NOTE — Telephone Encounter (Signed)
F/U     Elmo Putt from Dr. Lemar Lofty office calling in regards to lab results and surgical clearance.   States pt requested for her to call and get some clarification on some things.  Please return call to [place] at [phone].

## 2014-12-30 NOTE — Telephone Encounter (Signed)
Follow up      Returning [name] Christensen's call

## 2014-12-30 NOTE — Telephone Encounter (Signed)
Would like to know blood culture results and if patient cleared for surgery. Advised I would review with Dr. [name] on Monday and let them know. She is agreeable.

## 2015-01-02 ENCOUNTER — Other Ambulatory Visit: Payer: Self-pay | Admitting: *Deleted

## 2015-01-02 MED ORDER — LOSARTAN POTASSIUM 50 MG PO TABS: ORAL_TABLET | ORAL | Status: DC

## 2015-01-04 NOTE — Telephone Encounter (Signed)
Faxed clearance to Dr. Clyda Greener office.

## 2015-01-04 NOTE — Telephone Encounter (Signed)
Blood culture results negative. Patient cleared for surgery, per Dr. [name].

## 2015-01-05 ENCOUNTER — Telehealth: Payer: Self-pay | Admitting: Internal Medicine

## 2015-01-05 NOTE — Telephone Encounter (Signed)
New message      Request for surgical clearance:  What type of surgery is being performed?hernia surgery 1. When is this surgery scheduled? Not scheduled  Are there any medications that need to be held prior to surgery and how long?can he hold plavix 2. Name of physician performing surgery? Dr Johney Maine   3. What is your office phone and fax number? Fax [phone] 4. They got clearance but the holding plavix was not addressed.

## 2015-01-05 NOTE — Telephone Encounter (Signed)
Spoke with [name] Christensen and she has not received clearance form. I am unable to locate in office.  Per phone note dated 12/29/14 pt was cleared for surgery on 01/04/15.  [name] Christensen states pt also needs approval from cardiology to stop Plavix 5-7 days prior to surgery.  Does not need to stop ASA.

## 2015-01-05 NOTE — Telephone Encounter (Signed)
Reviewed with Richardson Dopp, PA and pt may hold Plavix 5-7 days prior to surgery. He should not stop ASA. Will fax this phone note to Alisha--Fax [phone]

## 2015-01-16 ENCOUNTER — Encounter (HOSPITAL_COMMUNITY): Payer: Self-pay | Admitting: *Deleted

## 2015-01-16 MED ORDER — CLINDAMYCIN PHOSPHATE 900 MG/50ML IV SOLN
900.0000 mg | INTRAVENOUS | Status: AC
  Administered 2015-01-17: 900 mg via INTRAVENOUS

## 2015-01-16 MED ORDER — GENTAMICIN IN SALINE 1-0.9 MG/ML-% IV SOLN
100.0000 mg | INTRAVENOUS | Status: AC
  Administered 2015-01-17: 100 mg via INTRAVENOUS
  Filled 2015-01-16 (×2): qty 100

## 2015-01-16 MED ORDER — CHLORHEXIDINE GLUCONATE 4 % EX LIQD
1.0000 "application " | Freq: Once | CUTANEOUS | Status: DC
  Filled 2015-01-16: qty 15

## 2015-01-17 ENCOUNTER — Ambulatory Visit (HOSPITAL_COMMUNITY): Payer: Medicare Other | Admitting: Anesthesiology

## 2015-01-17 ENCOUNTER — Encounter (HOSPITAL_COMMUNITY): Payer: Self-pay | Admitting: *Deleted

## 2015-01-17 ENCOUNTER — Encounter (HOSPITAL_COMMUNITY)
Admission: RE | Disposition: A | Discharge status: Home / Self Care | Payer: Self-pay | Source: Ambulatory Visit | Attending: Surgery

## 2015-01-17 ENCOUNTER — Observation Stay (HOSPITAL_COMMUNITY)
Admission: RE | Admit: 2015-01-17 | Discharge: 2015-01-18 | Disposition: A | Discharge status: Home / Self Care | Payer: Medicare Other | Source: Ambulatory Visit | Attending: Surgery | Admitting: Surgery

## 2015-01-17 DIAGNOSIS — E785 Hyperlipidemia, unspecified: Secondary | ICD-10-CM | POA: Insufficient documentation

## 2015-01-17 DIAGNOSIS — Z8719 Personal history of other diseases of the digestive system: Secondary | ICD-10-CM

## 2015-01-17 DIAGNOSIS — I10 Essential (primary) hypertension: Secondary | ICD-10-CM | POA: Diagnosis not present

## 2015-01-17 DIAGNOSIS — K5900 Constipation, unspecified: Secondary | ICD-10-CM | POA: Diagnosis not present

## 2015-01-17 DIAGNOSIS — I252 Old myocardial infarction: Secondary | ICD-10-CM | POA: Insufficient documentation

## 2015-01-17 DIAGNOSIS — I739 Peripheral vascular disease, unspecified: Secondary | ICD-10-CM | POA: Insufficient documentation

## 2015-01-17 DIAGNOSIS — E780 Pure hypercholesterolemia: Secondary | ICD-10-CM | POA: Diagnosis not present

## 2015-01-17 DIAGNOSIS — K219 Gastro-esophageal reflux disease without esophagitis: Secondary | ICD-10-CM | POA: Diagnosis not present

## 2015-01-17 DIAGNOSIS — Z888 Allergy status to other drugs, medicaments and biological substances status: Secondary | ICD-10-CM | POA: Diagnosis not present

## 2015-01-17 DIAGNOSIS — K4020 Bilateral inguinal hernia, without obstruction or gangrene, not specified as recurrent: Principal | ICD-10-CM

## 2015-01-17 DIAGNOSIS — K4000 Bilateral inguinal hernia, with obstruction, without gangrene, not specified as recurrent: Secondary | ICD-10-CM | POA: Diagnosis not present

## 2015-01-17 DIAGNOSIS — I5030 Unspecified diastolic (congestive) heart failure: Secondary | ICD-10-CM | POA: Insufficient documentation

## 2015-01-17 DIAGNOSIS — K4190 Unilateral femoral hernia, without obstruction or gangrene, not specified as recurrent: Secondary | ICD-10-CM | POA: Diagnosis not present

## 2015-01-17 DIAGNOSIS — Z79899 Other long term (current) drug therapy: Secondary | ICD-10-CM | POA: Insufficient documentation

## 2015-01-17 DIAGNOSIS — I4510 Unspecified right bundle-branch block: Secondary | ICD-10-CM | POA: Diagnosis not present

## 2015-01-17 DIAGNOSIS — Z950 Presence of cardiac pacemaker: Secondary | ICD-10-CM | POA: Insufficient documentation

## 2015-01-17 DIAGNOSIS — Z7902 Long term (current) use of antithrombotics/antiplatelets: Secondary | ICD-10-CM | POA: Diagnosis not present

## 2015-01-17 DIAGNOSIS — N400 Enlarged prostate without lower urinary tract symptoms: Secondary | ICD-10-CM | POA: Insufficient documentation

## 2015-01-17 DIAGNOSIS — Z880 Allergy status to penicillin: Secondary | ICD-10-CM | POA: Insufficient documentation

## 2015-01-17 DIAGNOSIS — Z9889 Other specified postprocedural states: Secondary | ICD-10-CM

## 2015-01-17 DIAGNOSIS — K4090 Unilateral inguinal hernia, without obstruction or gangrene, not specified as recurrent: Secondary | ICD-10-CM | POA: Diagnosis not present

## 2015-01-17 DIAGNOSIS — I2510 Atherosclerotic heart disease of native coronary artery without angina pectoris: Secondary | ICD-10-CM | POA: Diagnosis not present

## 2015-01-17 HISTORY — PX: INGUINAL HERNIA REPAIR: SHX194

## 2015-01-17 HISTORY — DX: Presence of automatic (implantable) cardiac defibrillator: Z95.810

## 2015-01-17 HISTORY — PX: INSERTION OF MESH: SHX5868

## 2015-01-17 LAB — CBC
HCT: 36.1 % — ABNORMAL LOW (ref 39.0–52.0)
Hemoglobin: 11.9 g/dL — ABNORMAL LOW (ref 13.0–17.0)
MCH: 29.5 pg (ref 26.0–34.0)
MCHC: 33 g/dL (ref 30.0–36.0)
MCV: 89.4 fL (ref 78.0–100.0)
Platelets: 128 10*3/uL — ABNORMAL LOW (ref 150–400)
RBC: 4.04 MIL/uL — ABNORMAL LOW (ref 4.22–5.81)
RDW: 13.6 % (ref 11.5–15.5)
WBC: 7.8 10*3/uL (ref 4.0–10.5)

## 2015-01-17 LAB — BASIC METABOLIC PANEL
Anion gap: 7 (ref 5–15)
BUN: 13 mg/dL (ref 6–23)
CO2: 23 mmol/L (ref 19–32)
Calcium: 10.2 mg/dL (ref 8.4–10.5)
Chloride: 106 mmol/L (ref 96–112)
Creatinine, Ser: 1.09 mg/dL (ref 0.50–1.35)
GFR calc Af Amer: 72 mL/min — ABNORMAL LOW (ref >90)
GFR calc non Af Amer: 62 mL/min — ABNORMAL LOW (ref >90)
Glucose, Bld: 81 mg/dL (ref 70–99)
Potassium: 4.3 mmol/L (ref 3.5–5.1)
Sodium: 136 mmol/L (ref 135–145)

## 2015-01-17 SURGERY — REPAIR, HERNIA, INGUINAL, LAPAROSCOPIC
Anesthesia: General | Site: Groin | Laterality: Bilateral

## 2015-01-17 MED ORDER — PROMETHAZINE HCL 25 MG/ML IJ SOLN: 6.2500 mg | INTRAMUSCULAR | Status: DC | PRN

## 2015-01-17 MED ORDER — ONDANSETRON HCL 4 MG PO TABS: 4.0000 mg | ORAL_TABLET | Freq: Four times a day (QID) | ORAL | Status: DC | PRN

## 2015-01-17 MED ORDER — ACETAMINOPHEN 650 MG RE SUPP: 650.0000 mg | Freq: Four times a day (QID) | RECTAL | Status: DC | PRN

## 2015-01-17 MED ORDER — LACTATED RINGERS IV SOLN
INTRAVENOUS | Status: DC | PRN
  Administered 2015-01-17 (×2): via INTRAVENOUS

## 2015-01-17 MED ORDER — NEOSTIGMINE METHYLSULFATE 10 MG/10ML IV SOLN
INTRAVENOUS | Status: DC | PRN
  Administered 2015-01-17: 3 mg via INTRAVENOUS

## 2015-01-17 MED ORDER — PROMETHAZINE HCL 25 MG/ML IJ SOLN
6.2500 mg | INTRAMUSCULAR | Status: DC | PRN
Start: 2015-01-17 — End: 2015-01-17

## 2015-01-17 MED ORDER — MENTHOL 3 MG MT LOZG: 1.0000 | LOZENGE | OROMUCOSAL | Status: DC | PRN

## 2015-01-17 MED ORDER — BISACODYL 10 MG RE SUPP: 10.0000 mg | Freq: Two times a day (BID) | RECTAL | Status: DC | PRN

## 2015-01-17 MED ORDER — FENTANYL CITRATE 0.05 MG/ML IJ SOLN: 25.0000 ug | INTRAMUSCULAR | Status: DC | PRN

## 2015-01-17 MED ORDER — OXYCODONE HCL 5 MG PO TABS
5.0000 mg | ORAL_TABLET | ORAL | Status: DC | PRN
Start: 2015-01-17 — End: 2015-01-18

## 2015-01-17 MED ORDER — ALUM & MAG HYDROXIDE-SIMETH 200-200-20 MG/5ML PO SUSP
30.0000 mL | Freq: Four times a day (QID) | ORAL | Status: DC | PRN
Start: 2015-01-17 — End: 2015-01-18

## 2015-01-17 MED ORDER — BUPIVACAINE-EPINEPHRINE (PF) 0.25% -1:200000 IJ SOLN
INTRAMUSCULAR | Status: AC
  Filled 2015-01-17: qty 30

## 2015-01-17 MED ORDER — MEPERIDINE HCL 25 MG/ML IJ SOLN: 6.2500 mg | INTRAMUSCULAR | Status: DC | PRN

## 2015-01-17 MED ORDER — FENTANYL CITRATE 0.05 MG/ML IJ SOLN
INTRAMUSCULAR | Status: AC
  Filled 2015-01-17: qty 5

## 2015-01-17 MED ORDER — SODIUM CHLORIDE 0.9 % IV SOLN: 250.0000 mL | INTRAVENOUS | Status: DC | PRN

## 2015-01-17 MED ORDER — LACTATED RINGERS IV BOLUS (SEPSIS): 1000.0000 mL | Freq: Three times a day (TID) | INTRAVENOUS | Status: DC | PRN

## 2015-01-17 MED ORDER — SODIUM CHLORIDE 0.9 % IJ SOLN: 3.0000 mL | Freq: Two times a day (BID) | INTRAMUSCULAR | Status: DC

## 2015-01-17 MED ORDER — ONDANSETRON HCL 4 MG/2ML IJ SOLN
INTRAMUSCULAR | Status: DC | PRN
  Administered 2015-01-17: 4 mg via INTRAVENOUS

## 2015-01-17 MED ORDER — PHENYLEPHRINE HCL 10 MG/ML IJ SOLN
10.0000 mg | INTRAVENOUS | Status: DC | PRN
  Administered 2015-01-17: 40 ug/min via INTRAVENOUS

## 2015-01-17 MED ORDER — PHENOL 1.4 % MT LIQD: 2.0000 | OROMUCOSAL | Status: DC | PRN

## 2015-01-17 MED ORDER — DIPHENHYDRAMINE HCL 50 MG/ML IJ SOLN: 12.5000 mg | Freq: Four times a day (QID) | INTRAMUSCULAR | Status: DC | PRN

## 2015-01-17 MED ORDER — BUPIVACAINE-EPINEPHRINE 0.25% -1:200000 IJ SOLN
INTRAMUSCULAR | Status: DC | PRN
  Administered 2015-01-17: 74 mL

## 2015-01-17 MED ORDER — ONDANSETRON 8 MG/NS 50 ML IVPB
8.0000 mg | Freq: Four times a day (QID) | INTRAVENOUS | Status: DC | PRN
  Filled 2015-01-17: qty 8

## 2015-01-17 MED ORDER — LIP MEDEX EX OINT: 1.0000 "application " | TOPICAL_OINTMENT | Freq: Two times a day (BID) | CUTANEOUS | Status: DC

## 2015-01-17 MED ORDER — OXYCODONE HCL 5 MG PO TABS: 5.0000 mg | ORAL_TABLET | ORAL | Status: DC | PRN

## 2015-01-17 MED ORDER — FENTANYL CITRATE 0.05 MG/ML IJ SOLN
INTRAMUSCULAR | Status: DC | PRN
  Administered 2015-01-17: 50 ug via INTRAVENOUS
  Administered 2015-01-17: 100 ug via INTRAVENOUS

## 2015-01-17 MED ORDER — PROPOFOL 10 MG/ML IV BOLUS
INTRAVENOUS | Status: DC | PRN
  Administered 2015-01-17: 100 mg via INTRAVENOUS
  Administered 2015-01-17: 40 mg via INTRAVENOUS

## 2015-01-17 MED ORDER — MAGIC MOUTHWASH: 15.0000 mL | Freq: Four times a day (QID) | ORAL | Status: DC | PRN

## 2015-01-17 MED ORDER — HYDROMORPHONE HCL 1 MG/ML IJ SOLN
INTRAMUSCULAR | Status: AC
  Filled 2015-01-17: qty 1

## 2015-01-17 MED ORDER — GLYCOPYRROLATE 0.2 MG/ML IJ SOLN
INTRAMUSCULAR | Status: AC
  Filled 2015-01-17: qty 2

## 2015-01-17 MED ORDER — GLYCOPYRROLATE 0.2 MG/ML IJ SOLN
INTRAMUSCULAR | Status: DC | PRN
  Administered 2015-01-17: .4 mg via INTRAVENOUS

## 2015-01-17 MED ORDER — BLISTEX MEDICATED EX OINT
TOPICAL_OINTMENT | Freq: Two times a day (BID) | CUTANEOUS | Status: DC
  Administered 2015-01-18: 1 via TOPICAL
  Filled 2015-01-17: qty 10

## 2015-01-17 MED ORDER — NEOSTIGMINE METHYLSULFATE 10 MG/10ML IV SOLN
INTRAVENOUS | Status: AC
  Filled 2015-01-17: qty 1

## 2015-01-17 MED ORDER — ACETAMINOPHEN 325 MG PO TABS
325.0000 mg | ORAL_TABLET | Freq: Four times a day (QID) | ORAL | Status: DC | PRN
  Administered 2015-01-18: 650 mg via ORAL
  Filled 2015-01-17: qty 2

## 2015-01-17 MED ORDER — HYDROMORPHONE HCL 1 MG/ML IJ SOLN
0.2500 mg | INTRAMUSCULAR | Status: DC | PRN
  Administered 2015-01-17 (×2): 0.5 mg via INTRAVENOUS

## 2015-01-17 MED ORDER — CLINDAMYCIN PHOSPHATE 900 MG/50ML IV SOLN
INTRAVENOUS | Status: AC
  Filled 2015-01-17: qty 50

## 2015-01-17 MED ORDER — LIDOCAINE HCL (CARDIAC) 20 MG/ML IV SOLN
INTRAVENOUS | Status: DC | PRN
  Administered 2015-01-17: 100 mg via INTRAVENOUS

## 2015-01-17 MED ORDER — ONDANSETRON HCL 4 MG/2ML IJ SOLN: 4.0000 mg | Freq: Four times a day (QID) | INTRAMUSCULAR | Status: DC | PRN

## 2015-01-17 MED ORDER — LACTATED RINGERS IV SOLN
INTRAVENOUS | Status: DC
  Administered 2015-01-17: 13:00:00 via INTRAVENOUS

## 2015-01-17 MED ORDER — ROCURONIUM BROMIDE 100 MG/10ML IV SOLN
INTRAVENOUS | Status: DC | PRN
  Administered 2015-01-17: 40 mg via INTRAVENOUS

## 2015-01-17 MED ORDER — KETOROLAC TROMETHAMINE 30 MG/ML IJ SOLN
INTRAMUSCULAR | Status: AC
  Filled 2015-01-17: qty 1

## 2015-01-17 MED ORDER — SODIUM CHLORIDE 0.9 % IR SOLN
Status: DC | PRN
  Administered 2015-01-17: 1000 mL

## 2015-01-17 MED ORDER — SODIUM CHLORIDE 0.9 % IJ SOLN: 3.0000 mL | INTRAMUSCULAR | Status: DC | PRN

## 2015-01-17 MED ORDER — ONDANSETRON HCL 4 MG/2ML IJ SOLN
INTRAMUSCULAR | Status: AC
  Filled 2015-01-17: qty 2

## 2015-01-17 SURGICAL SUPPLY — 47 items
APPLIER CLIP 5 13 M/L LIGAMAX5 (MISCELLANEOUS)
BLADE SURG CLIPPER 3M 9600 (MISCELLANEOUS) IMPLANT
CANISTER SUCTION 2500CC (MISCELLANEOUS) IMPLANT
CHLORAPREP W/TINT 26ML (MISCELLANEOUS) ×3 IMPLANT
CLIP APPLIE 5 13 M/L LIGAMAX5 (MISCELLANEOUS) IMPLANT
COVER SURGICAL LIGHT HANDLE (MISCELLANEOUS) ×3 IMPLANT
DRAPE LAPAROSCOPIC ABDOMINAL (DRAPES) ×3 IMPLANT
DRAPE WARM FLUID 44X44 (DRAPE) ×3 IMPLANT
DRSG TEGADERM 2-3/8X2-3/4 SM (GAUZE/BANDAGES/DRESSINGS) ×9 IMPLANT
DRSG TEGADERM 4X4.75 (GAUZE/BANDAGES/DRESSINGS) ×3 IMPLANT
ELECT REM PT RETURN 9FT ADLT (ELECTROSURGICAL) ×3
ELECTRODE REM PT RTRN 9FT ADLT (ELECTROSURGICAL) ×2 IMPLANT
GAUZE SPONGE 2X2 8PLY STRL LF (GAUZE/BANDAGES/DRESSINGS) ×2 IMPLANT
GLOVE BIO SURGEON STRL SZ7 (GLOVE) ×3 IMPLANT
GLOVE BIO SURGEON STRL SZ7.5 (GLOVE) ×3 IMPLANT
GLOVE BIOGEL PI IND STRL 7.0 (GLOVE) ×2 IMPLANT
GLOVE BIOGEL PI IND STRL 8 (GLOVE) ×2 IMPLANT
GLOVE BIOGEL PI INDICATOR 7.0 (GLOVE) ×1
GLOVE BIOGEL PI INDICATOR 8 (GLOVE) ×1
GLOVE ECLIPSE 6.5 STRL STRAW (GLOVE) ×3 IMPLANT
GLOVE ECLIPSE 8.0 STRL XLNG CF (GLOVE) ×3 IMPLANT
GOWN STRL REUS W/ TWL LRG LVL3 (GOWN DISPOSABLE) ×4 IMPLANT
GOWN STRL REUS W/ TWL XL LVL3 (GOWN DISPOSABLE) ×2 IMPLANT
GOWN STRL REUS W/TWL LRG LVL3 (GOWN DISPOSABLE) ×4
GOWN STRL REUS W/TWL XL LVL3 (GOWN DISPOSABLE) ×2
KIT BASIN OR (CUSTOM PROCEDURE TRAY) ×3 IMPLANT
KIT ROOM TURNOVER OR (KITS) ×3 IMPLANT
MESH ULTRAPRO 6X6 15CM15CM (Mesh General) ×6 IMPLANT
NEEDLE 22X1 1/2 (OR ONLY) (NEEDLE) ×3 IMPLANT
NS IRRIG 1000ML POUR BTL (IV SOLUTION) ×3 IMPLANT
PAD ARMBOARD 7.5X6 YLW CONV (MISCELLANEOUS) ×6 IMPLANT
SCISSORS LAP 5X35 DISP (ENDOMECHANICALS) IMPLANT
SET IRRIG TUBING LAPAROSCOPIC (IRRIGATION / IRRIGATOR) IMPLANT
SLEEVE ENDOPATH XCEL 5M (ENDOMECHANICALS) ×3 IMPLANT
SPONGE GAUZE 2X2 STER 10/PKG (GAUZE/BANDAGES/DRESSINGS) ×1
SUT MNCRL AB 4-0 PS2 18 (SUTURE) ×3 IMPLANT
SUT VIC AB 3-0 SH 27 (SUTURE)
SUT VIC AB 3-0 SH 27XBRD (SUTURE) IMPLANT
TACKER 5MM HERNIA 3.5CML NAB (ENDOMECHANICALS) IMPLANT
TOWEL OR 17X24 6PK STRL BLUE (TOWEL DISPOSABLE) ×3 IMPLANT
TOWEL OR 17X26 10 PK STRL BLUE (TOWEL DISPOSABLE) ×3 IMPLANT
TRAY FOLEY CATH 16FR SILVER (SET/KITS/TRAYS/PACK) IMPLANT
TRAY LAPAROSCOPIC (CUSTOM PROCEDURE TRAY) ×3 IMPLANT
TROCAR BLADELESS 12MM (ENDOMECHANICALS) ×3 IMPLANT
TROCAR XCEL BLUNT TIP 100MML (ENDOMECHANICALS) ×3 IMPLANT
TROCAR XCEL NON-BLD 5MMX100MML (ENDOMECHANICALS) ×3 IMPLANT
TUBING INSUFFLATION (TUBING) ×3 IMPLANT

## 2015-01-17 NOTE — Anesthesia Procedure Notes (Signed)
Procedure Name: Intubation Date/Time: 01/17/2015 2:50 PM Performed by: Scheryl Darter Pre-anesthesia Checklist: Patient identified, Emergency Drugs available, Suction available and Patient being monitored Patient Re-evaluated:Patient Re-evaluated prior to inductionOxygen Delivery Method: Circle system utilized Preoxygenation: Pre-oxygenation with 100% oxygen Intubation Type: IV induction Ventilation: Mask ventilation without difficulty Laryngoscope Size: Miller and 3 Grade View: Grade I Tube type: Oral Tube size: 8.0 mm Number of attempts: 1 Airway Equipment and Method: Stylet Placement Confirmation: ETT inserted through vocal cords under direct vision,  positive ETCO2 and breath sounds checked- equal and bilateral Secured at: 22 cm Tube secured with: Tape Dental Injury: Teeth and Oropharynx as per pre-operative assessment

## 2015-01-17 NOTE — Progress Notes (Signed)
Care of pt assumed by [facility]. Pt has walked 8 laps around PACU, BRP X2 w/asst. Still unable to void. Up in recliner, bladder scan max showing > 145ml. Belly soft, minimal distention. He is not uncomfortable, Will allow to rest & relax & try BR again.

## 2015-01-17 NOTE — Anesthesia Postprocedure Evaluation (Signed)
Anesthesia Post Note  [name]  Procedure(s) Performed: Procedure(s) (LRB): BILATERAL LAPAROSCOPIC INGUINAL HERNIA REPAIR WITH LEFT FEMORAL HERNIA REPAIR (Bilateral) INSERTION OF MESH (Bilateral)  Anesthesia type: General  Patient location: PACU  Post pain: Pain level controlled and Adequate analgesia  Post assessment: Post-op Vital signs reviewed, Patient's Cardiovascular Status Stable, Respiratory Function Stable, Patent Airway and Pain level controlled  Last Vitals:  Filed Vitals:   01/17/15 1800  BP:   Pulse: 56  Temp:   Resp: 8    Post vital signs: Reviewed and stable  Level of consciousness: awake, alert  and oriented  Complications: No apparent anesthesia complications

## 2015-01-17 NOTE — Anesthesia Preprocedure Evaluation (Addendum)
Anesthesia Evaluation  Patient identified by MRN, date of birth, ID band Patient awake    Reviewed: Allergy & Precautions, NPO status , Patient's Chart, lab work & pertinent test results, reviewed documented beta blocker date and time   Airway        Dental   Pulmonary neg pulmonary ROS,          Cardiovascular hypertension, Pt. on medications and Pt. on home beta blockers + angina + CAD, + CABG and + Peripheral Vascular Disease + dysrhythmias + pacemaker + Cardiac Defibrillator  AICD interrogation 12/21/2014 Longevity ~7 yrs.  1% VP, 99% VS   Echo 12/2013 - Left ventricle: The cavity size was normal. There was moderate focal basal and mild concentric hypertrophy of the septum. Systolic function was mildly reduced. The estimated ejection fraction was in the range of 45% to 50%. Inferior hypokinesis. Doppler parameters are consistent with abnormal left ventricular relaxation (grade 1 diastolic dysfunction). The E/e' ratio is >10, suggestingelevated LV filling pressure. - Aortic valve: Moderately calcified leaflets. There was no stenosis. No regurgitation. - Right ventricle: The cavity size was mildly dilated. AICD wire noted in right ventricle. Low normal systolic function. - Right atrium: The atrium was mildly dilated (20 cm2). - Tricuspid valve: Mild regurgitation. - Pulmonary arteries: PA peak pressure: 18mm Hg (S). - Inferior vena cava: The vessel was normal in size; the respirophasic diameter changes were in the normal range (=50%); findings are consistent with normal central venouspressure. - Pericardium, extracardiac: There was no pericardial effusion.     Neuro/Psych negative neurological ROS  negative psych ROS   GI/Hepatic GERD-  ,(+) Hepatitis -, C  Endo/Other  negative endocrine ROS  Renal/GU negative Renal ROS     Musculoskeletal negative musculoskeletal ROS (+)   Abdominal   Peds  Hematology negative hematology ROS (+)   Anesthesia Other Findings   Reproductive/Obstetrics negative OB ROS                          Anesthesia Physical Anesthesia Plan  ASA: III  Anesthesia Plan: General   Post-op Pain Management:    Induction: Intravenous  Airway Management Planned: Oral ETT  Additional Equipment: None  Intra-op Plan:   Post-operative Plan: Extubation in OR  Informed Consent: I have reviewed the patients History and Physical, chart, labs and discussed the procedure including the risks, benefits and alternatives for the proposed anesthesia with the patient or authorized representative who has indicated his/her understanding and acceptance.   Dental advisory given  Plan Discussed with: CRNA  Anesthesia Plan Comments: (Plan magnet over defibrillator. )      Anesthesia Quick Evaluation

## 2015-01-17 NOTE — Progress Notes (Signed)
Pt still unable to void, he is tense, worried about his wife because she has been here all day; he wants to stay overnight if foley will be needed. He is not uncomfortable in any way. Dr Ninfa Linden (on call) fully updated and order to keep pt overnite, foley as per Dr Clyda Greener post op orders if pt still unable to void, and floor to notify Dr gross in the morning that pt had to stay.

## 2015-01-17 NOTE — Transfer of Care (Signed)
Immediate Anesthesia Transfer of Care Note  Patient: LENNIS KORB  Procedure(s) Performed: Procedure(s): BILATERAL LAPAROSCOPIC INGUINAL HERNIA REPAIR WITH LEFT FEMORAL HERNIA REPAIR (Bilateral) INSERTION OF MESH (Bilateral)  Patient Location: PACU  Anesthesia Type:General  Level of Consciousness: awake, alert  and patient cooperative  Airway & Oxygen Therapy: Patient Spontanous Breathing and Patient connected to face mask oxygen  Post-op Assessment: Report given to RN, Post -op Vital signs reviewed and stable and Patient moving all extremities  Post vital signs: Reviewed and stable  Last Vitals:  Filed Vitals:   01/17/15 1615  BP:   Pulse: 82  Temp:   Resp: 16    Complications: No apparent anesthesia complications

## 2015-01-17 NOTE — Op Note (Signed)
01/17/2015  4:07 PM  PATIENT:  [name] Christensen  81 y.o. male  Patient Care Team: Cassandria Anger, MD as PCP - General (Internal Medicine) Josue Hector, MD as Attending Physician (Cardiology)  PRE-OPERATIVE DIAGNOSIS:  Inguinal Hernia  POST-OPERATIVE DIAGNOSIS:    Bilateral Inguinal Hernia Left femoral hernia   PROCEDURE:  Procedure(s): BILATERAL LAPAROSCOPIC INGUINAL HERNIA REPAIR LEFT FEMORAL HERNIA REPAIR INSERTION OF MESH  SURGEON:  Surgeon(s): Michael Boston, MD  ASSISTANT: RN   ANESTHESIA:   local and general  EBL:     Delay start of Pharmacological VTE agent (>24hrs) due to surgical blood loss or risk of bleeding:  no  DRAINS: none   SPECIMEN:  No Specimen  DISPOSITION OF SPECIMEN:  N/A  COUNTS:  YES  PLAN OF CARE: Discharge to home after PACU  PATIENT DISPOSITION:  PACU - hemodynamically stable.  INDICATION: Patient with R>L inguinal hernias.  I recommended exploration and repair  The anatomy & physiology of the abdominal wall and pelvic floor was discussed.  The pathophysiology of hernias in the inguinal and pelvic region was discussed.  Natural history risks such as progressive enlargement, pain, incarceration & strangulation was discussed.   Contributors to complications such as smoking, obesity, diabetes, prior surgery, etc were discussed.    I feel the risks of no intervention will lead to serious problems that outweigh the operative risks; therefore, I recommended surgery to reduce and repair the hernia.  I explained laparoscopic techniques with possible need for an open approach.  I noted usual use of mesh to patch and/or buttress hernia repair  Risks such as bleeding, infection, abscess, need for further treatment, heart attack, death, and other risks were discussed.  I noted a good likelihood this will help address the problem.   Goals of post-operative recovery were discussed as well.  Possibility that this will not correct all symptoms was  explained.  I stressed the importance of low-impact activity, aggressive pain control, avoiding constipation, & not pushing through pain to minimize risk of post-operative chronic pain or injury. Possibility of reherniation was discussed.  We will work to minimize complications.     An educational handout further explaining the pathology & treatment options was given as well.  Questions were answered.  The patient expresses understanding & wishes to proceed with surgery.  OR FINDINGS: R>L bilateral indirect inguinal hernias.  Mild left direct space inguinal hernia.  Small left femoral hernia  DESCRIPTION:   The patient was identified & brought into the operating room. The patient was positioned supine with arms tucked. SCDs were active during the entire case. The patient underwent general anesthesia without any difficulty.  The abdomen was prepped and draped in a sterile fashion. The patient's bladder was emptied.  A Surgical Timeout confirmed our plan.  I made a transverse incision through the inferior umbilical fold.  I made a small transverse nick through the anterior rectus fascia contralateral to the inguinal hernia side and placed a 0-vicryl stitch through the fascia.  I placed a Hasson trocar into the preperitoneal plane.  Entry was clean.  We induced carbon dioxide insufflation. Camera inspection revealed no injury.  I used a 97mm angled scope to bluntly free the peritoneum off the infraumbilical anterior abdominal wall.  I created enough of a preperitoneal pocket to place 60mm ports into the right & left mid-abdomen into this preperitoneal cavity.  I focused attention on the right side since that was the dominant hernia side.   I used blunt &  focused sharp dissection to free the peritoneum off the flank and down to the pubic rim.  I freed the anteriolateral bladder wall off the anteriolateral pelvic wall, sparing midline attachments.   I located a swath of peritoneum going into a hernia fascial  defect at the internal ring consistent with an indirect inguinal hernia.  I gradually freed the peritoneal hernia sac off safely and reduced it into the preperitoneal space.  I freed the peritoneum off the spermatic vessels & vas deferens.  I freed peritoneum off the retroperitoneum along the psoas muscle.    I checked & assured hemostasis.    I turned attention on the opposite side.  I did dissection in a similar, mirror-image fashion. The patient had a smaller indirect & direct inguinal hernias.  Small left femoral hernia as well.  In freeing off the hernia sacs I did have a breech in the peritoneum in the right mid-abdomen.  I closed that using absorbable Vicryl stitch using laparoscopic intracorporeal suturing, tacking the large right inguinal hernia peritoneal sac over it as well for a high ligation.  I chose 15x15 cm sheets of ultra-lightweight polypropylene mesh (Ultrapro), one for each side.  I cut a single sigmoid-shaped slit ~6cm from a corner of each mesh.  I placed the meshes into the preperitoneal space & laid them as overlapping diamonds such that at the inferior points, a 6x6 cm corner flap rested in the true anterolateral pelvis, covering the obturator & femoral foramina.   I allowed the bladder to fall back and help tuck the corners of the mesh in.  The medial corners overlapped each other across midline cephalad to the pubic rim.   This provided >2 inch coverage around the hernia.  Because the defects well covered and not particularly large, I did not need tacks to hold the mesh in place  I held the hernia sacs cephalad & evacuated carbon dioxide.  I closed the fascia  With absorbable suture.  I closed the skin using 4-0 monocryl stitch.  Sterile dressings were applied. The patient was extubated & arrived in the PACU in stable condition..  I had discussed postoperative care with the patient in the holding area.   I did discuss operative findings and postoperative goals / instructions to his  wife as well.  Instructions are written in the chart.  Adin Hector, M.D., F.A.C.S. Gastrointestinal and Minimally Invasive Surgery [facility] [address] [place], [place] [address] [phone] Main / Paging

## 2015-01-17 NOTE — Progress Notes (Signed)
Pt has fully updated his wife and she is going home now.

## 2015-01-17 NOTE — H&P (Signed)
[name] Christensen 12/12/2014 2:51 PM Location: [facility] Patient #: [id number] DOB: 1932/08/18 Married / Language: Cleophus Molt / Race: White Male  History of Present Illness Adin Hector MD; 12/12/2014 5:08 PM) Patient words: RIH.  The patient is a 81 year old male who presents with an inguinal hernia. Patient sent by primary care physician Dr. Alain Marion, for concern of right inguinal hernia on exam and ultrasound.  Pleasant and active male. Comes today with his wife. Has had a history of coronary disease with bypass graft. Then had an episode of sudden cardiac death requiring resuscitation but recovered. Had ICD implantation. [employer]. Followed by cardiology. No events. Walks a treadmill to miles a day. Patient is chronically anticoagulated on Plavix. No major bleeding events.  Patient noted some bulging and discomfort on right side. Physical exam concerning for inguinal hernia. Primary care physician concerning prolapse of a small left inguinal hernia as well. Surgical consultation requested. Patient has had some intermittent abdominal pains and some some discomfort with eating. Normally has BM every day. Has had increased constipation. Taking low-dose Metamucil without much help. Suggestions made for CAT scan but patient wished to hold off. However the pain persists in concerns him. He's had some discomfort in the groin hernia on the right side but this is not like that. Wishes to reconsider CAT scan. Had open appendectomy at age 44. No other surgeries. Does have a large prostate and mildly elevated PSA.. Followed by urologist in [place]. No major urinary issues. Patient had stool studies done which were negative by PCR. Colonoscopy held off.   Other Problems Davy Pique Bynum, CMA; 12/12/2014 2:52 PM) Back Pain Congestive Heart Failure Gastroesophageal Reflux Disease General anesthesia - complications High blood  pressure Hypercholesterolemia Myocardial infarction Other disease, cancer, significant illness  Past Surgical History Marjean Donna, CMA; 12/12/2014 2:52 PM) Appendectomy Coronary Artery Bypass Graft  Diagnostic Studies History Marjean Donna, CMA; 12/12/2014 2:52 PM) Colonoscopy within last year  Allergies Davy Pique Bynum, CMA; 12/12/2014 2:54 PM) Niacin (Antihyperlipidemic) *ANTIHYPERLIPIDEMICS* Penicillamine *ASSORTED CLASSES* Pravastatin Sodium *ANTIHYPERLIPIDEMICS*  Medication History (Sonya Bynum, CMA; 12/12/2014 2:53 PM) Alfuzosin HCl ER (10MG  Tablet ER 24HR, Oral) Active. Diazepam (5MG  Tablet, Oral) Active. Atenolol (50MG  Tablet, Oral) Active. Clopidogrel Bisulfate (75MG  Tablet, Oral) Active. Losartan Potassium (50MG  Tablet, Oral) Active. Ketoconazole (2% Cream, External) Active. Nitrostat (0.4MG  Tab Sublingual, Sublingual as needed) Active. Simvastatin (20MG  Tablet, Oral) Active. Mometasone Furoate (0.1% Cream, External) Active.  Social History ([place]; 12/12/2014 2:52 PM) Caffeine use Coffee. No alcohol use No drug use Tobacco use Never smoker.  Family History Marjean Donna, CMA; 12/12/2014 2:52 PM) Heart Disease Father, Mother. Heart disease in male family member before age 48 Hypertension Father, Mother. Respiratory Condition Father.  Review of Systems ([place]; 12/12/2014 2:52 PM) General Present- Chills. Not Present- Appetite Loss, Fatigue, Fever, Night Sweats, Weight Gain and Weight Loss. HEENT Present- Wears glasses/contact lenses. Not Present- Earache, Hearing Loss, Hoarseness, Nose Bleed, Oral Ulcers, Ringing in the Ears, Seasonal Allergies, Sinus Pain, Sore Throat, Visual Disturbances and Yellow Eyes. Gastrointestinal Present- Abdominal Pain. Not Present- Bloating, Bloody Stool, Change in Bowel Habits, Chronic diarrhea, Constipation, Difficulty Swallowing, Excessive gas, Gets full quickly at meals, Hemorrhoids,  Indigestion, Nausea, Rectal Pain and Vomiting. Musculoskeletal Present- Back Pain. Not Present- Joint Pain, Joint Stiffness, Muscle Pain, Muscle Weakness and Swelling of Extremities. Hematology Present- Easy Bruising. Not Present- Excessive bleeding, Gland problems, HIV and Persistent Infections.   Vitals (Sonya Bynum CMA; 12/12/2014 2:52 PM) 12/12/2014 2:52 PM Weight: 153  lb Height: 66in Body Surface Area: 1.8 m Body Mass Index: 24.69 kg/m Temp.: 98.47F(Temporal)  Pulse: 51 (Regular)  BP: 152/94 (Sitting, Left Arm, Standard)    Physical Exam Adin Hector MD; 12/12/2014 3:37 PM) General Mental Status-Alert. General Appearance-Not in acute distress, Not Sickly. Orientation-Oriented X3. Hydration-Well hydrated. Voice-Normal.  Integumentary Global Assessment Upon inspection and palpation of skin surfaces of the - Axillae: non-tender, no inflammation or ulceration, no drainage. and Distribution of scalp and body hair is normal. General Characteristics Temperature - normal warmth is noted.  Head and Neck Head-normocephalic, atraumatic with no lesions or palpable masses. Face Global Assessment - atraumatic, no absence of expression. Neck Global Assessment - no abnormal movements, no bruit auscultated on the right, no bruit auscultated on the left, no decreased range of motion, non-tender. Trachea-midline. Thyroid Gland Characteristics - non-tender.  Eye Eyeball - Left-Extraocular movements intact, No Nystagmus. Eyeball - Right-Extraocular movements intact, No Nystagmus. Cornea - Left-No Hazy. Cornea - Right-No Hazy. Sclera/Conjunctiva - Left-No scleral icterus, No Discharge. Sclera/Conjunctiva - Right-No scleral icterus, No Discharge. Pupil - Left-Direct reaction to light normal. Pupil - Right-Direct reaction to light normal.  ENMT Ears Pinna - Left - no drainage observed, no generalized tenderness observed. Right - no drainage  observed, no generalized tenderness observed. Nose and Sinuses External Inspection of the Nose - no destructive lesion observed. Inspection of the nares - Left - quiet respiration. Right - quiet respiration. Mouth and Throat Lips - Upper Lip - no fissures observed, no pallor noted. Lower Lip - no fissures observed, no pallor noted. Nasopharynx - no discharge present. Oral Cavity/Oropharynx - Tongue - no dryness observed. Oral Mucosa - no cyanosis observed. Hypopharynx - no evidence of airway distress observed.  Chest and Lung Exam Inspection Movements - Normal and Symmetrical. Accessory muscles - No use of accessory muscles in breathing. Palpation Palpation of the chest reveals - Non-tender. Auscultation Breath sounds - Normal and Clear.  Cardiovascular Auscultation Rhythm - Regular. Murmurs & Other Heart Sounds - Auscultation of the heart reveals - No Murmurs and No Systolic Clicks.  Abdomen Inspection Inspection of the abdomen reveals - No Visible peristalsis and No Abnormal pulsations. Umbilicus - No Bleeding, No Urine drainage. Palpation/Percussion Palpation and Percussion of the abdomen reveal - Soft, Non Tender, No Rebound tenderness, No Rigidity (guarding) and No Cutaneous hyperesthesia.  Male Genitourinary Sexual Maturity Tanner 5 - Adult hair pattern and Adult penile size and shape. Note: Normal external male genitalia. Testes mildly atrophic but normal range for 81 year old.  Obvious right inguinal hernia. Left inguinal hernia present on Valsalva. Both seemed to be indirect.   Peripheral Vascular Upper Extremity Inspection - Left - No Cyanotic nailbeds, Not Ischemic. Right - No Cyanotic nailbeds, Not Ischemic.  Neurologic Neurologic evaluation reveals -normal attention span and ability to concentrate, able to name objects and repeat phrases. Appropriate fund of knowledge , normal sensation and normal coordination. Mental Status Affect - not angry, not  paranoid. Cranial Nerves-Normal Bilaterally. Gait-Normal.  Neuropsychiatric Mental status exam performed with findings of-able to articulate well with normal speech/language, rate, volume and coherence, thought content normal with ability to perform basic computations and apply abstract reasoning and no evidence of hallucinations, delusions, obsessions or homicidal/suicidal ideation.  Musculoskeletal Global Assessment Spine, Ribs and Pelvis - no instability, subluxation or laxity. Right Upper Extremity - no instability, subluxation or laxity.  Lymphatic Head & Neck  General Head & Neck Lymphatics: Bilateral - Description - No Localized lymphadenopathy. Axillary  General Axillary Region: Bilateral -  Description - No Localized lymphadenopathy. Femoral & Inguinal  Generalized Femoral & Inguinal Lymphatics: Left - Description - No Localized lymphadenopathy. Right - Description - No Localized lymphadenopathy.    Assessment & Plan Adin Hector MD; 12/12/2014 3:42 PM) BILATERAL INGUINAL HERNIA WITHOUT OBSTRUCTION OR GANGRENE, RECURRENCE NOT SPECIFIED (550.92  K40.20) Impression: Right greater than left inguinal hernias. I think he would benefit from repair. We plan laparoscopic approach. He and his wife are hoping to have it done before middle February when patient's wife needs I surgery.  Patient does have significant cardiac history but has excellent exercise tolerance. Followed by his cardiologist closely. Reasonable operative risks. He strongly prefers [place] if possible given his allegiance was the cardiologists there. That is reasonable. Would like cardiac clearance to make sure it is safe to come off the Plavix and also perioperative management of the [facility] defibrillator Current Plans  Schedule for Surgery Advised patient to stop ASA, anticoagulant, blood thinners, and NSAIDs Three (3) days prior to surgery. Pt Education - [facility] Hernia Post-Op HCI (Juliocesar Blasius):  discussed with patient and provided information. Pt Education - [facility] Good Bowel Health (Jatziri Goffredo) Pt Education - [facility] Pain Control (Brietta Manso) The anatomy & physiology of the abdominal wall and pelvic floor was discussed. The pathophysiology of hernias in the inguinal and pelvic region was discussed. Natural history risks such as progressive enlargement, pain, incarceration, and strangulation was discussed. Contributors to complications such as smoking, obesity, diabetes, prior surgery, etc were discussed.  I feel the risks of no intervention will lead to serious problems that outweigh the operative risks; therefore, I recommended surgery to reduce and repair the hernia. I explained laparoscopic techniques with possible need for an open approach. I noted usual use of mesh to patch and/or buttress hernia repair  Risks such as bleeding, infection, abscess, need for further treatment, heart attack, death, and other risks were discussed. I noted a good likelihood this will help address the problem. Goals of post-operative recovery were discussed as well. Possibility that this will not correct all symptoms was explained. I stressed the importance of low-impact activity, aggressive pain control, avoiding constipation, & not pushing through pain to minimize risk of post-operative chronic pain or injury. Possibility of reherniation was discussed. We will work to minimize complications.  An educational handout further explaining the pathology & treatment options was given as well. Questions were answered. The patient expresses understanding & wishes to proceed with surgery. PERIUMBILICAL ABDOMINAL PAIN (789.05  R10.33) Impression: Uncertain etiology.  Because of his intermittent crampy abdominal pains and change in bowel habits, he now wishes to proceed with considering a CAT scan. We will obtain a before surgery to make sure there are no surprises. Colon PCR test was normal. Hold off on colonoscopy for now. That would  be another consideration.  I am skeptical that the inguinal hernias would explain his abdominal pain which is separate from that region.    Signed by Adin Hector, MD (12/12/2014 5:14 PM)  Adin Hector, M.D., F.A.C.S. Gastrointestinal and Minimally Invasive Surgery [facility] [address], [place], [place] [address] [phone] Main / Paging

## 2015-01-18 DIAGNOSIS — K4020 Bilateral inguinal hernia, without obstruction or gangrene, not specified as recurrent: Secondary | ICD-10-CM | POA: Diagnosis not present

## 2015-01-18 DIAGNOSIS — K4190 Unilateral femoral hernia, without obstruction or gangrene, not specified as recurrent: Secondary | ICD-10-CM | POA: Diagnosis not present

## 2015-01-18 DIAGNOSIS — I2510 Atherosclerotic heart disease of native coronary artery without angina pectoris: Secondary | ICD-10-CM | POA: Diagnosis not present

## 2015-01-18 DIAGNOSIS — I5030 Unspecified diastolic (congestive) heart failure: Secondary | ICD-10-CM | POA: Diagnosis not present

## 2015-01-18 DIAGNOSIS — E780 Pure hypercholesterolemia: Secondary | ICD-10-CM | POA: Diagnosis not present

## 2015-01-18 DIAGNOSIS — I10 Essential (primary) hypertension: Secondary | ICD-10-CM | POA: Diagnosis not present

## 2015-01-18 MED ORDER — ALFUZOSIN HCL ER 10 MG PO TB24
10.0000 mg | ORAL_TABLET | Freq: Every day | ORAL | Status: DC
  Administered 2015-01-18: 10 mg via ORAL
  Filled 2015-01-18 (×2): qty 1

## 2015-01-18 NOTE — Discharge Instructions (Signed)
HERNIA REPAIR: POST OP INSTRUCTIONS  1. DIET: Follow a light bland diet the first 24 hours after arrival home, such as soup, liquids, crackers, etc.  Be sure to include lots of fluids daily.  Avoid fast food or heavy meals as your are more likely to get nauseated.  Eat a low fat the next few days after surgery. 2. Take your usually prescribed home medications unless otherwise directed. 3. PAIN CONTROL: a. Pain is best controlled by a usual combination of three different methods TOGETHER: i. Ice/Heat ii. Over the counter pain medication iii. Prescription pain medication b. Most patients will experience some swelling and bruising around the hernia(s) such as the bellybutton, groins, or old incisions.  Ice packs or heating pads (30-60 minutes up to 6 times a day) will help. Use ice for the first few days to help decrease swelling and bruising, then switch to heat to help relax tight/sore spots and speed recovery.  Some people prefer to use ice alone, heat alone, alternating between ice & heat.  Experiment to what works for you.  Swelling and bruising can take several weeks to resolve.   c. It is helpful to take an over-the-counter pain medication regularly for the first few weeks.  Choose one of the following that works best for you: i. Naproxen (Aleve, etc)  Two 225m tabs twice a day ii. Ibuprofen (Advil, etc) Three 2035mtabs four times a day (every meal & bedtime) iii. Acetaminophen (Tylenol, etc) 325-65046mour times a day (every meal & bedtime) d. A  prescription for pain medication should be given to you upon discharge.  Take your pain medication as prescribed.  i. If you are having problems/concerns with the prescription medicine (does not control pain, nausea, vomiting, rash, itching, etc), please call us [phone] see if we need to switch you to a different pain medicine that will work better for you and/or control your side effect better. ii. If you need a refill on your pain  medication, please contact your pharmacy.  They will contact our office to request authorization. Prescriptions will not be filled after 5 pm or on week-ends. 4. Avoid getting constipated.  Between the surgery and the pain medications, it is common to experience some constipation.  Increasing fluid intake and taking a fiber supplement (such as Metamucil, Citrucel, FiberCon, MiraLax, etc) 1-2 times a day regularly will usually help prevent this problem from occurring.  A mild laxative (prune juice, Milk of Magnesia, MiraLax, etc) should be taken according to package directions if there are no bowel movements after 48 hours.   5. Wash / shower every day.  You may shower over the dressings as they are waterproof.   6. Remove your waterproof bandages 5 days after surgery.  You may leave the incision open to air.  You may replace a dressing/Band-Aid to cover the incision for comfort if you wish.  Continue to shower over incision(s) after the dressing is off.    7. ACTIVITIES as tolerated:   a. You may resume regular (light) daily activities beginning the next day--such as daily self-care, walking, climbing stairs--gradually increasing activities as tolerated.  If you can walk 30 minutes without difficulty, it is safe to try more intense activity such as jogging, treadmill, bicycling, low-impact aerobics, swimming, etc. b. Save the most intensive and strenuous activity for last such as sit-ups, heavy lifting, contact sports, etc  Refrain from any heavy lifting or straining until you are off narcotics for pain control.  c. DO NOT PUSH THROUGH PAIN.  Let pain be your guide: If it hurts to do something, don't do it.  Pain is your body warning you to avoid that activity for another week until the pain goes down. d. You may drive when you are no longer taking prescription pain medication, you can comfortably wear a seatbelt, and you can safely maneuver your car and apply brakes. e. Dennis Bast may have sexual intercourse  when it is comfortable.  8. FOLLOW UP in our office a. Please call [facility] at [phone] to set up an appointment to see your surgeon in the office for a follow-up appointment approximately 2-3 weeks after your surgery. b. Make sure that you call for this appointment the day you arrive home to insure a convenient appointment time. 9.  IF YOU HAVE DISABILITY OR FAMILY LEAVE FORMS, BRING THEM TO THE OFFICE FOR PROCESSING.  DO NOT GIVE THEM TO YOUR DOCTOR.  WHEN TO CALL us [phone]: 1. Poor pain control 2. Reactions / problems with new medications (rash/itching, nausea, etc)  3. Fever over 101.5 F (38.5 C) 4. Inability to urinate 5. Nausea and/or vomiting 6. Worsening swelling or bruising 7. Continued bleeding from incision. 8. Increased pain, redness, or drainage from the incision   The clinic staff is available to answer your questions during regular business hours (8:30am-5pm).  Please dont hesitate to call and ask to speak to one of our nurses for clinical concerns.   If you have a medical emergency, go to the nearest emergency room or call 911.  A surgeon from [facility] is always on call at the hospitals in [facility], [place], [place], [place], [place]  [address] ?  [address] [address], [place], [place]   [address] MAIN: [phone] ? TOLL FREE: [phone] ? FAX: [phone] [email or web address]  Managing Pain  Pain after surgery or related to activity is often due to strain/injury to muscle, tendon, nerves and/or incisions.  This pain is usually short-term and will improve in a few months.   Many people find it helpful to do the following things TOGETHER to help speed the process of healing and to get back to regular activity more quickly:  1. Avoid heavy physical activity a.  no lifting greater than 20 pounds b. Do not push through the pain.  Listen to your body and avoid positions and maneuvers than  reproduce the pain c. Walking is okay as tolerated, but go slowly and stop when getting sore.  d. Remember: If it hurts to do it, then dont do it! 2. Take Anti-inflammatory medication  a. Take with food/snack around the clock for 1-2 weeks i. This helps the muscle and nerve tissues become less irritable and calm down faster b. Choose ONE of the following over-the-counter medications: i. Naproxen 252m tabs (ex. Aleve) 1-2 pills twice a day  ii. Ibuprofen 2072mtabs (ex. Advil, Motrin) 3-4 pills with every meal and just before bedtime iii. Acetaminophen 50062mabs (Tylenol) 1-2 pills with every meal and just before bedtime 3. Use a Heating pad or Ice/Cold Pack a. 4-6 times a day b. May use warm bath/hottub  or showers 4. Try Gentle Massage and/or Stretching  a. at the area of pain many times a day b. stop if you feel pain - do not overdo it  Try these steps together to help you body heal faster and avoid making things get worse.  Doing just one of these  things may not be enough.    If you are not getting better after two weeks or are noticing you are getting worse, contact our office for further advice; we may need to re-evaluate you & see what other things we can do to help.  GETTING TO GOOD BOWEL HEALTH. Irregular bowel habits such as constipation and diarrhea can lead to many problems over time.  Having one soft bowel movement a day is the most important way to prevent further problems.  The anorectal canal is designed to handle stretching and feces to safely manage our ability to get rid of solid waste (feces, poop, stool) out of our body.  BUT, hard constipated stools can act like ripping concrete bricks and diarrhea can be a burning fire to this very sensitive area of our body, causing inflamed hemorrhoids, anal fissures, increasing risk is perirectal abscesses, abdominal pain/bloating, an making irritable bowel worse.     The goal: ONE SOFT BOWEL MOVEMENT A DAY!  To have soft, regular  bowel movements:   Drink at least 8 tall glasses of water a day.    Take plenty of fiber.  Fiber is the undigested part of plant food that passes into the colon, acting s natures broom to encourage bowel motility and movement.  Fiber can absorb and hold large amounts of water. This results in a larger, bulkier stool, which is soft and easier to pass. Work gradually over several weeks up to 6 servings a day of fiber (25g a day even more if needed) in the form of: o Vegetables -- Root (potatoes, carrots, turnips), leafy green (lettuce, salad greens, celery, spinach), or cooked high residue (cabbage, broccoli, etc) o Fruit -- Fresh (unpeeled skin & pulp), Dried (prunes, apricots, cherries, etc ),  or stewed ( applesauce)  o Whole grain breads, pasta, etc (whole wheat)  o Bran cereals   Bulking Agents -- This type of water-retaining fiber generally is easily obtained each day by one of the following:  o Psyllium bran -- The psyllium plant is remarkable because its ground seeds can retain so much water. This product is available as Metamucil, Konsyl, Effersyllium, Per Diem Fiber, or the less expensive generic preparation in drug and health food stores. Although labeled a laxative, it really is not a laxative.  o Methylcellulose -- This is another fiber derived from wood which also retains water. It is available as Citrucel. o Polyethylene Glycol - and artificial fiber commonly called Miralax or Glycolax.  It is helpful for people with gassy or bloated feelings with regular fiber o Flax Seed - a less gassy fiber than psyllium  No reading or other relaxing activity while on the toilet. If bowel movements take longer than 5 minutes, you are too constipated  AVOID CONSTIPATION.  High fiber and water intake usually takes care of this.  Sometimes a laxative is needed to stimulate more frequent bowel movements, but   Laxatives are not a good long-term solution as it can wear the colon out. o Osmotics  (Milk of Magnesia, Fleets phosphosoda, Magnesium citrate, MiraLax, GoLytely) are safer than  o Stimulants (Senokot, Castor Oil, Dulcolax, Ex Lax)    o Do not take laxatives for more than 7days in a row.   IF SEVERELY CONSTIPATED, try a Bowel Retraining Program: o Do not use laxatives.  o Eat a diet high in roughage, such as bran cereals and leafy vegetables.  o Drink six (6) ounces of prune or apricot juice each morning.  o Eat  two (2) large servings of stewed fruit each day.  o Take one (1) heaping tablespoon of a psyllium-based bulking agent twice a day. Use sugar-free sweetener when possible to avoid excessive calories.  o Eat a normal breakfast.  o Set aside 15 minutes after breakfast to sit on the toilet, but do not strain to have a bowel movement.  o If you do not have a bowel movement by the third day, use an enema and repeat the above steps.   Controlling diarrhea o Switch to liquids and simpler foods for a few days to avoid stressing your intestines further. o Avoid dairy products (especially milk & ice cream) for a short time.  The intestines often can lose the ability to digest lactose when stressed. o Avoid foods that cause gassiness or bloating.  Typical foods include beans and other legumes, cabbage, broccoli, and dairy foods.  Every person has some sensitivity to other foods, so listen to our body and avoid those foods that trigger problems for you. o Adding fiber (Citrucel, Metamucil, psyllium, Miralax) gradually can help thicken stools by absorbing excess fluid and retrain the intestines to act more normally.  Slowly increase the dose over a few weeks.  Too much fiber too soon can backfire and cause cramping & bloating. o Probiotics (such as active yogurt, Align, etc) may help repopulate the intestines and colon with normal bacteria and calm down a sensitive digestive tract.  Most studies show it to be of mild help, though, and such products can be costly. o Medicines: - Bismuth  subsalicylate (ex. Kayopectate, Pepto Bismol) every 30 minutes for up to 6 doses can help control diarrhea.  Avoid if pregnant. - Loperamide (Immodium) can slow down diarrhea.  Start with two tablets ($RemoveBefo'4mg'GqaBRAZWGJi$  total) first and then try one tablet every 6 hours.  Avoid if you are having fevers or severe pain.  If you are not better or start feeling worse, stop all medicines and call your doctor for advice o Call your doctor if you are getting worse or not better.  Sometimes further testing (cultures, endoscopy, X-ray studies, bloodwork, etc) may be needed to help diagnose and treat the cause of the diarrhea.  Inguinal Hernia, Adult Muscles help keep everything in the body in its proper place. But if a weak spot in the muscles develops, something can poke through. That is called a hernia. When this happens in the lower part of the belly (abdomen), it is called an inguinal hernia. (It takes its name from a part of the body in this region called the inguinal canal.) A weak spot in the wall of muscles lets some fat or part of the small intestine bulge through. An inguinal hernia can develop at any age. Men get them more often than women. CAUSES  In adults, an inguinal hernia develops over time.  It can be triggered by:  Suddenly straining the muscles of the lower abdomen.  Lifting heavy objects.  Straining to have a bowel movement. Difficult bowel movements (constipation) can lead to this.  Constant coughing. This may be caused by smoking or lung disease.  Being overweight.  Being pregnant.  Working at a job that requires long periods of standing or heavy lifting.  Having had an inguinal hernia before. One type can be an emergency situation. It is called a strangulated inguinal hernia. It develops if part of the small intestine slips through the weak spot and cannot get back into the abdomen. The blood supply can be cut off.  If that happens, part of the intestine may die. This situation requires  emergency surgery. SYMPTOMS  Often, a small inguinal hernia has no symptoms. It is found when a healthcare provider does a physical exam. Larger hernias usually have symptoms.   In adults, symptoms may include:  A lump in the groin. This is easier to see when the person is standing. It might disappear when lying down.  In men, a lump in the scrotum.  Pain or burning in the groin. This occurs especially when lifting, straining or coughing.  A dull ache or feeling of pressure in the groin.  Signs of a strangulated hernia can include:  A bulge in the groin that becomes very painful and tender to the touch.  A bulge that turns red or purple.  Fever, nausea and vomiting.  Inability to have a bowel movement or to pass gas. DIAGNOSIS  To decide if you have an inguinal hernia, a healthcare provider will probably do a physical examination.  This will include asking questions about any symptoms you have noticed.  The healthcare provider might feel the groin area and ask you to cough. If an inguinal hernia is felt, the healthcare provider may try to slide it back into the abdomen.  Usually no other tests are needed. TREATMENT  Treatments can vary. The size of the hernia makes a difference. Options include:  Watchful waiting. This is often suggested if the hernia is small and you have had no symptoms.  No medical procedure will be done unless symptoms develop.  You will need to watch closely for symptoms. If any occur, contact your healthcare provider right away.  Surgery. This is used if the hernia is larger or you have symptoms.  Open surgery. This is usually an outpatient procedure (you will not stay overnight in a hospital). An cut (incision) is made through the skin in the groin. The hernia is put back inside the abdomen. The weak area in the muscles is then repaired by herniorrhaphy or hernioplasty. Herniorrhaphy: in this type of surgery, the weak muscles are sewn back together.  Hernioplasty: a patch or mesh is used to close the weak area in the abdominal wall.  Laparoscopy. In this procedure, a surgeon makes small incisions. A thin tube with a tiny video camera (called a laparoscope) is put into the abdomen. The surgeon repairs the hernia with mesh by looking with the video camera and using two long instruments. HOME CARE INSTRUCTIONS   After surgery to repair an inguinal hernia:  You will need to take pain medicine prescribed by your healthcare provider. Follow all directions carefully.  You will need to take care of the wound from the incision.  Your activity will be restricted for awhile. This will probably include no heavy lifting for several weeks. You also should not do anything too active for a few weeks. When you can return to work will depend on the type of job that you have.  During "watchful waiting" periods, you should:  Maintain a healthy weight.  Eat a diet high in fiber (fruits, vegetables and whole grains).  Drink plenty of fluids to avoid constipation. This means drinking enough water and other liquids to keep your urine clear or pale yellow.  Do not lift heavy objects.  Do not stand for long periods of time.  Quit smoking. This should keep you from developing a frequent cough. SEEK MEDICAL CARE IF:   A bulge develops in your groin area.  You feel pain, a  burning sensation or pressure in the groin. This might be worse if you are lifting or straining.  You develop a fever of more than 100.5 F (38.1 C). SEEK IMMEDIATE MEDICAL CARE IF:   Pain in the groin increases suddenly.  A bulge in the groin gets bigger suddenly and does not go down.  For men, there is sudden pain in the scrotum. Or, the size of the scrotum increases.  A bulge in the groin area becomes red or purple and is painful to touch.  You have nausea or vomiting that does not go away.  You feel your heart beating much faster than normal.  You cannot have a bowel  movement or pass gas.  You develop a fever of more than 102.0 F (38.9 C). Document Released: 03/30/2009 Document Revised: 02/03/2012 Document Reviewed: 03/30/2009 [facility] Patient Information 2015 [place], [place]. This information is not intended to replace advice given to you by your health care provider. Make sure you discuss any questions you have with your health care provider.   General Anesthesia, Adult, Care After  Refer to this sheet in the next few weeks. These instructions provide you with information on caring for yourself after your procedure. Your health care provider may also give you more specific instructions. Your treatment has been planned according to current medical practices, but problems sometimes occur. Call your health care provider if you have any problems or questions after your procedure.  WHAT TO EXPECT AFTER THE PROCEDURE  After the procedure, it is typical to experience:  Sleepiness.  Nausea and vomiting. HOME CARE INSTRUCTIONS  For the first 24 hours after general anesthesia:  Have a responsible person with you.  Do not drive a car. If you are alone, do not take public transportation.  Do not drink alcohol.  Do not take medicine that has not been prescribed by your health care provider.  Do not sign important papers or make important decisions.  You may resume a normal diet and activities as directed by your health care provider.  Change bandages (dressings) as directed.  If you have questions or problems that seem related to general anesthesia, call the hospital and ask for the anesthetist or anesthesiologist on call. SEEK MEDICAL CARE IF:  You have nausea and vomiting that continue the day after anesthesia.  You develop a rash. SEEK IMMEDIATE MEDICAL CARE IF:  You have difficulty breathing.  You have chest pain.  You have any allergic problems. Document Released: 02/17/2001 Document Revised: 07/14/2013 Document Reviewed: 05/27/2013  [facility] Patient  Information 2014 [place], [place].   Exercise to Stay Healthy Exercise helps you become and stay healthy. EXERCISE IDEAS AND TIPS Choose exercises that:  You enjoy.  Fit into your day. You do not need to exercise really hard to be healthy. You can do exercises at a slow or medium level and stay healthy. You can:  Stretch before and after working out.  Try yoga, Pilates, or tai chi.  Lift weights.  Walk fast, swim, jog, run, climb stairs, bicycle, dance, or rollerskate.  Take aerobic classes. Exercises that burn about 150 calories:  Running 1  miles in 15 minutes.  Playing volleyball for 45 to 60 minutes.  Washing and waxing a car for 45 to 60 minutes.  Playing touch football for 45 minutes.  Walking 1  miles in 35 minutes.  Pushing a stroller 1  miles in 30 minutes.  Playing basketball for 30 minutes.  Raking leaves for 30 minutes.  Bicycling 5 miles  in 30 minutes.  Walking 2 miles in 30 minutes.  Dancing for 30 minutes.  Shoveling snow for 15 minutes.  Swimming laps for 20 minutes.  Walking up stairs for 15 minutes.  Bicycling 4 miles in 15 minutes.  Gardening for 30 to 45 minutes.  Jumping rope for 15 minutes.  Washing windows or floors for 45 to 60 minutes. Document Released: 12/14/2010 Document Revised: 02/03/2012 Document Reviewed: 12/14/2010 [facility] Patient Information 2015 [place], [place]. This information is not intended to replace advice given to you by your health care provider. Make sure you discuss any questions you have with your health care provider.

## 2015-01-18 NOTE — Progress Notes (Signed)
0230 Patient still unable to void. Patient states" He feels like he needs to go but unable." Patient scan for 324. Patient has not voided in 12 hrs. In and out cath for 275 amber urine.

## 2015-01-18 NOTE — Progress Notes (Signed)
Discharge instructions gone over with patient and family. Home medications gone over. Prescription was given and filled last night. Diet, activity, and reasons to call the doctor gone over. Incisional care gone over. Follow up appointment is made. Patient has my chart. Signs and symptoms of infection gone over. Patient stated accurately how to use nitroglycerin tablets if needed. Patient verbalized understanding of instructions.

## 2015-01-18 NOTE — Discharge Summary (Signed)
excursion. CV:  Pulses intact.  Regular rhythm MS: Normal AROM mjr joints.  No obvious deformity Abdomen: Soft, Nondistended.  Min tender.  No incarcerated hernias. GU:  NEMG.  No scrotal swelling/ecchymosis Ext:  SCDs BLE.  No significant edema.  No cyanosis Skin: No petechiae / purpura  Discharged Condition: good   Past Medical History  Diagnosis Date  . Coronary artery disease     a. s/p MI/CABG 2005. b. s/p cath at time of VF arrest 10/2013 - grafts patent.  [name] GERD (gastroesophageal reflux disease)   . Hyperlipidemia   . Hypertension   . BPH (benign prostatic hypertrophy)      elevated PSA Dr. Jilda Roche Bx 2010  . RBBB   . Cardiac arrest     a. out-of-hospital arrest 11/21/2012 - EF 40-45%, patent grafts on cath, received [employer] Jude AICD.  [name] Ventricular bigeminy     a. Event monitor 01/2013: NSR with PVCs and occ bigeminy.  . Elevated LFTs     a. 10/2012 felt due to cardiac arrest - Hepatitis C Ab reactive from 11/13/2012>>Hep C RNA PCR negative 11/24/2012.   . Carotid disease, bilateral     a. 0-39% by doppers.  [name] AICD (automatic cardioverter/defibrillator) present 2013  . Presence of permanent cardiac pacemaker 2013  . Constipation     Past Surgical History  Procedure Laterality Date  . Cardiac catheterization  2007    with patent graft anatomy atretic left internal mammary  artery to the LAD which is nonobstructive. Will restart study June 08, 2007  . Coronary artery bypass graft  2005  . Lumbar fusion  09/2007  . Cardiac defibrillator placement  dec 2013    Dr. [name], [place]  . Appendectomy    . Left heart catheterization with coronary/graft angiogram N/A 11/20/2012    Procedure: LEFT HEART CATHETERIZATION WITH Beatrix Fetters;  Surgeon: Peter M Martinique, MD;  Location: [facility] CATH LAB;  Service: Cardiovascular;  Laterality: N/A;  . Implantable cardioverter defibrillator implant N/A 10/28/2012    Procedure: IMPLANTABLE CARDIOVERTER DEFIBRILLATOR IMPLANT;  Surgeon: Deboraha Sprang, MD;  Location: [facility] CATH LAB;  Service: Cardiovascular;  Laterality: N/A;  . Back surgery      x 2- then fusion    History   Social History  . Marital Status: Married    Spouse Name: N/A  . Number of Children: 5  . Years of Education: N/A   Occupational History  . Realtor    Social History Main Topics  . Smoking status: Never Smoker   . Smokeless tobacco: Never Used  . Alcohol Use: No  . Drug Use: No  . Sexual Activity: Not on file   Other Topics Concern  . Not on file   Social History Narrative    Family History  Problem Relation Age of Onset  . Coronary  artery disease Mother   . Heart attack Brother     Current Facility-Administered Medications  Medication Dose Route Frequency Provider Last Rate Last Dose  . 0.9 %  sodium chloride infusion  250 mL Intravenous PRN Michael Boston, MD      . acetaminophen (TYLENOL) suppository 650 mg  650 mg Rectal Q6H PRN Michael Boston, MD      . acetaminophen (TYLENOL) tablet 325-650 mg  325-650 mg Oral Q6H PRN Michael Boston, MD      . alfuzosin (UROXATRAL) 24 hr tablet 10 mg  10 mg Oral Q breakfast Coralie Keens, MD   10 mg at 01/18/15 0701  .  excursion. CV:  Pulses intact.  Regular rhythm MS: Normal AROM mjr joints.  No obvious deformity Abdomen: Soft, Nondistended.  Min tender.  No incarcerated hernias. GU:  NEMG.  No scrotal swelling/ecchymosis Ext:  SCDs BLE.  No significant edema.  No cyanosis Skin: No petechiae / purpura  Discharged Condition: good   Past Medical History  Diagnosis Date  . Coronary artery disease     a. s/p MI/CABG 2005. b. s/p cath at time of VF arrest 10/2013 - grafts patent.  [name] GERD (gastroesophageal reflux disease)   . Hyperlipidemia   . Hypertension   . BPH (benign prostatic hypertrophy)      elevated PSA Dr. Lindell Noe Bx 2010  . RBBB   . Cardiac arrest     a. out-of-hospital arrest 10/26/2012 - EF 40-45%, patent grafts on cath, received [employer] Jude AICD.  [name] Ventricular bigeminy     a. Event monitor 01/2013: NSR with PVCs and occ bigeminy.  . Elevated LFTs     a. 10/2012 felt due to cardiac arrest - Hepatitis C Ab reactive from 11/24/2012>>Hep C RNA PCR negative 10/26/2012.   . Carotid disease, bilateral     a. 0-39% by doppers.  [name] AICD (automatic cardioverter/defibrillator) present 2013  . Presence of permanent cardiac pacemaker 2013  . Constipation     Past Surgical History  Procedure Laterality Date  . Cardiac catheterization  2007    with patent graft anatomy atretic left internal mammary  artery to the LAD which is nonobstructive. Will restart study June 08, 2007  . Coronary artery bypass graft  2005  . Lumbar fusion  09/2007  . Cardiac defibrillator placement  dec 2013    Dr. Graciela Husbands, [facility]. Jude  . Appendectomy    . Left heart catheterization with coronary/graft angiogram N/A 11/14/2012    Procedure: LEFT HEART CATHETERIZATION WITH Isabel Caprice;  Surgeon: [name], MD;  Location: [facility] CATH LAB;  Service: Cardiovascular;  Laterality: N/A;  . Implantable cardioverter defibrillator implant N/A 11/21/2012    Procedure: IMPLANTABLE CARDIOVERTER DEFIBRILLATOR IMPLANT;  Surgeon: Duke Salvia, MD;  Location: [facility] CATH LAB;  Service: Cardiovascular;  Laterality: N/A;  . Back surgery      x 2- then fusion    History   Social History  . Marital Status: Married    Spouse Name: N/A  . Number of Children: 5  . Years of Education: N/A   Occupational History  . Realtor    Social History Main Topics  . Smoking status: Never Smoker   . Smokeless tobacco: Never Used  . Alcohol Use: No  . Drug Use: No  . Sexual Activity: Not on file   Other Topics Concern  . Not on file   Social History Narrative    Family History  Problem Relation Age of Onset  . Coronary  artery disease Mother   . Heart attack Brother     Current Facility-Administered Medications  Medication Dose Route Frequency Provider Last Rate Last Dose  . 0.9 %  sodium chloride infusion  250 mL Intravenous PRN [name], MD      . acetaminophen (TYLENOL) suppository 650 mg  650 mg Rectal Q6H PRN [name], MD      . acetaminophen (TYLENOL) tablet 325-650 mg  325-650 mg Oral Q6H PRN [name], MD      . alfuzosin (UROXATRAL) 24 hr tablet 10 mg  10 mg Oral Q breakfast Abigail Miyamoto, MD   10 mg at 01/18/15 0701  .  excursion. CV:  Pulses intact.  Regular rhythm MS: Normal AROM mjr joints.  No obvious deformity Abdomen: Soft, Nondistended.  Min tender.  No incarcerated hernias. GU:  NEMG.  No scrotal swelling/ecchymosis Ext:  SCDs BLE.  No significant edema.  No cyanosis Skin: No petechiae / purpura  Discharged Condition: good   Past Medical History  Diagnosis Date  . Coronary artery disease     a. s/p MI/CABG 2005. b. s/p cath at time of VF arrest 10/2013 - grafts patent.  [name] GERD (gastroesophageal reflux disease)   . Hyperlipidemia   . Hypertension   . BPH (benign prostatic hypertrophy)      elevated PSA Dr. Jilda Roche Bx 2010  . RBBB   . Cardiac arrest     a. out-of-hospital arrest 11/21/2012 - EF 40-45%, patent grafts on cath, received [employer]. Jude AICD.  [name] Ventricular bigeminy     a. Event monitor 01/2013: NSR with PVCs and occ bigeminy.  . Elevated LFTs     a. 10/2012 felt due to cardiac arrest - Hepatitis C Ab reactive from 11/18/2012>>Hep C RNA PCR negative 11/24/2012.   . Carotid disease, bilateral     a. 0-39% by doppers.  [name] AICD (automatic cardioverter/defibrillator) present 2013  . Presence of permanent cardiac pacemaker 2013  . Constipation     Past Surgical History  Procedure Laterality Date  . Cardiac catheterization  2007    with patent graft anatomy atretic left internal mammary  artery to the LAD which is nonobstructive. Will restart study June 08, 2007  . Coronary artery bypass graft  2005  . Lumbar fusion  09/2007  . Cardiac defibrillator placement  dec 2013    Dr. [name], [place]  . Appendectomy    . Left heart catheterization with coronary/graft angiogram N/A 11/18/2012    Procedure: LEFT HEART CATHETERIZATION WITH Beatrix Fetters;  Surgeon: Peter M Martinique, MD;  Location: [facility] CATH LAB;  Service: Cardiovascular;  Laterality: N/A;  . Implantable cardioverter defibrillator implant N/A 10/30/2012    Procedure: IMPLANTABLE CARDIOVERTER DEFIBRILLATOR IMPLANT;  Surgeon: Deboraha Sprang, MD;  Location: [facility] CATH LAB;  Service: Cardiovascular;  Laterality: N/A;  . Back surgery      x 2- then fusion    History   Social History  . Marital Status: Married    Spouse Name: N/A  . Number of Children: 5  . Years of Education: N/A   Occupational History  . Realtor    Social History Main Topics  . Smoking status: Never Smoker   . Smokeless tobacco: Never Used  . Alcohol Use: No  . Drug Use: No  . Sexual Activity: Not on file   Other Topics Concern  . Not on file   Social History Narrative    Family History  Problem Relation Age of Onset  . Coronary  artery disease Mother   . Heart attack Brother     Current Facility-Administered Medications  Medication Dose Route Frequency Provider Last Rate Last Dose  . 0.9 %  sodium chloride infusion  250 mL Intravenous PRN Michael Boston, MD      . acetaminophen (TYLENOL) suppository 650 mg  650 mg Rectal Q6H PRN Michael Boston, MD      . acetaminophen (TYLENOL) tablet 325-650 mg  325-650 mg Oral Q6H PRN Michael Boston, MD      . alfuzosin (UROXATRAL) 24 hr tablet 10 mg  10 mg Oral Q breakfast Coralie Keens, MD   10 mg at 01/18/15 0701  .  MD for:  redness, tenderness, or signs of infection (pain, swelling, redness, odor or green/yellow discharge around incision site)    Complete by:  As directed      Call MD for:  redness, tenderness, or signs of infection (pain, swelling, redness, odor or green/yellow discharge around incision site)    Complete by:  As directed      Call MD for:  redness, tenderness, or signs of infection (pain, swelling, redness, odor or green/yellow discharge around incision site)    Complete by:  As directed      Call MD for:  severe uncontrolled pain    Complete by:  As directed      Call MD for:  severe uncontrolled pain    Complete by:  As directed      Call MD for:  severe uncontrolled pain    Complete by:  As directed      Call MD for:    Complete by:  As directed   Temperature > 101.81F     Call MD  for:    Complete by:  As directed   Temperature > 101.81F     Call MD for:    Complete by:  As directed   Temperature > 101.81F     Diet - low sodium heart healthy    Complete by:  As directed      Discharge instructions    Complete by:  As directed   Please see discharge instruction sheets.  Also refer to handout given an office.  Please call our office if you have any questions or concerns [phone]     Discharge instructions    Complete by:  As directed   Please see discharge instruction sheets.  Also refer to handout given an office.  Please call our office if you have any questions or concerns [phone]     Discharge instructions    Complete by:  As directed   Please see discharge instruction sheets.  Also refer to handout given an office.  Please call our office if you have any questions or concerns [phone]     Discharge wound care:    Complete by:  As directed   If you have closed incisions, shower and bathe over these incisions with soap and water every day.  Remove all surgical dressings on postoperative day #3.  You do not need to replace dressings over the closed incisions unless you feel more comfortable with a Band-Aid covering it.   If you have an open wound that requires packing, please see wound care instructions.  In general, remove all dressings, wash wound with soap and water and then replace with saline moistened gauze.  Do the dressing change at least every day.  Please call our office [phone] if you have further questions.     Discharge wound care:    Complete by:  As directed   If you have closed incisions, shower and bathe over these incisions with soap and water every day.  Remove all surgical dressings on postoperative day #3.  You do not need to replace dressings over the closed incisions unless you feel more comfortable with a Band-Aid covering it.   If you have an open wound that requires packing, please see wound care instructions.  In  general, remove all dressings, wash wound with soap and water and then replace with saline moistened gauze.  Do the dressing change at least every day.  Please call our office [phone] if  excursion. CV:  Pulses intact.  Regular rhythm MS: Normal AROM mjr joints.  No obvious deformity Abdomen: Soft, Nondistended.  Min tender.  No incarcerated hernias. GU:  NEMG.  No scrotal swelling/ecchymosis Ext:  SCDs BLE.  No significant edema.  No cyanosis Skin: No petechiae / purpura  Discharged Condition: good   Past Medical History  Diagnosis Date  . Coronary artery disease     a. s/p MI/CABG 2005. b. s/p cath at time of VF arrest 10/2013 - grafts patent.  [name] GERD (gastroesophageal reflux disease)   . Hyperlipidemia   . Hypertension   . BPH (benign prostatic hypertrophy)      elevated PSA Dr. Jilda Roche Bx 2010  . RBBB   . Cardiac arrest     a. out-of-hospital arrest 11/21/2012 - EF 40-45%, patent grafts on cath, received [employer]. Jude AICD.  [name] Ventricular bigeminy     a. Event monitor 01/2013: NSR with PVCs and occ bigeminy.  . Elevated LFTs     a. 10/2012 felt due to cardiac arrest - Hepatitis C Ab reactive from 10/26/2012>>Hep C RNA PCR negative 11/24/2012.   . Carotid disease, bilateral     a. 0-39% by doppers.  [name] AICD (automatic cardioverter/defibrillator) present 2013  . Presence of permanent cardiac pacemaker 2013  . Constipation     Past Surgical History  Procedure Laterality Date  . Cardiac catheterization  2007    with patent graft anatomy atretic left internal mammary  artery to the LAD which is nonobstructive. Will restart study June 08, 2007  . Coronary artery bypass graft  2005  . Lumbar fusion  09/2007  . Cardiac defibrillator placement  dec 2013    Dr. [name], [place]  . Appendectomy    . Left heart catheterization with coronary/graft angiogram N/A 11/19/2012    Procedure: LEFT HEART CATHETERIZATION WITH Beatrix Fetters;  Surgeon: Peter M Martinique, MD;  Location: [facility] CATH LAB;  Service: Cardiovascular;  Laterality: N/A;  . Implantable cardioverter defibrillator implant N/A 11/13/2012    Procedure: IMPLANTABLE CARDIOVERTER DEFIBRILLATOR IMPLANT;  Surgeon: Deboraha Sprang, MD;  Location: [facility] CATH LAB;  Service: Cardiovascular;  Laterality: N/A;  . Back surgery      x 2- then fusion    History   Social History  . Marital Status: Married    Spouse Name: N/A  . Number of Children: 5  . Years of Education: N/A   Occupational History  . Realtor    Social History Main Topics  . Smoking status: Never Smoker   . Smokeless tobacco: Never Used  . Alcohol Use: No  . Drug Use: No  . Sexual Activity: Not on file   Other Topics Concern  . Not on file   Social History Narrative    Family History  Problem Relation Age of Onset  . Coronary  artery disease Mother   . Heart attack Brother     Current Facility-Administered Medications  Medication Dose Route Frequency Provider Last Rate Last Dose  . 0.9 %  sodium chloride infusion  250 mL Intravenous PRN Michael Boston, MD      . acetaminophen (TYLENOL) suppository 650 mg  650 mg Rectal Q6H PRN Michael Boston, MD      . acetaminophen (TYLENOL) tablet 325-650 mg  325-650 mg Oral Q6H PRN Michael Boston, MD      . alfuzosin (UROXATRAL) 24 hr tablet 10 mg  10 mg Oral Q breakfast Coralie Keens, MD   10 mg at 01/18/15 0701  .  or four more stressful to your incision than any lifting you will do. Pain will protect you from injury.  Therefore, avoid intense activity until off all narcotic pain medications.  Coughing and sneezing or four more stressful to your incision than any lifting he will do.     Lifting restrictions    Complete by:  As directed   Avoid heavy lifting initially.  Do not push through pain.  You have no specific weight limit.  Coughing and sneezing or four more  stressful to your incision than any lifting you will do. Pain will protect you from injury.  Therefore, avoid intense activity until off all narcotic pain medications.  Coughing and sneezing or four more stressful to your incision than any lifting he will do.     Lifting restrictions    Complete by:  As directed   Avoid heavy lifting initially.  Do not push through pain.  You have no specific weight limit.  Coughing and sneezing or four more stressful to your incision than any lifting you will do. Pain will protect you from injury.  Therefore, avoid intense activity until off all narcotic pain medications.  Coughing and sneezing or four more stressful to your incision than any lifting he will do.     May shower / Bathe    Complete by:  As directed      May shower / Bathe    Complete by:  As directed      May shower / Bathe    Complete by:  As directed      May walk up steps    Complete by:  As directed      May walk up steps    Complete by:  As directed      May walk up steps    Complete by:  As directed      Sexual Activity Restrictions    Complete by:  As directed   Sexual activity as tolerated.  Do not push through pain.  Pain will protect you from injury.     Sexual Activity Restrictions    Complete by:  As directed   Sexual activity as tolerated.  Do not push through pain.  Pain will protect you from injury.     Sexual Activity Restrictions    Complete by:  As directed   Sexual activity as tolerated.  Do not push through pain.  Pain will protect you from injury.     Walk with assistance    Complete by:  As directed   Walk over an hour a day.  May use a walker/cane/companion to help with balance and stamina.     Walk with assistance    Complete by:  As directed   Walk over an hour a day.  May use a walker/cane/companion to help with balance and stamina.     Walk with assistance    Complete by:  As directed   Walk over an hour a day.  May use a walker/cane/companion to help with  balance and stamina.            Medication List    TAKE these medications        acetaminophen 500 MG tablet  Commonly known as:  TYLENOL  Take 500 mg by mouth every 6 (six) hours as needed for mild pain, moderate pain or headache.     alfuzosin 10 MG 24 hr tablet  Commonly known as:  UROXATRAL  Take 10 mg by mouth  every evening.     aspirin 81 MG chewable tablet  Chew 1 tablet (81 mg total) by mouth daily.     atenolol 50 MG tablet  Commonly known as:  TENORMIN  Take 0.5 tablets (25 mg total) by mouth daily. Take one tablet by mouth at night     betamethasone dipropionate 0.05 % cream  Commonly known as:  DIPROLENE  Apply topically 2 (two) times daily.     clopidogrel 75 MG tablet  Commonly known as:  PLAVIX  Take 1 tablet (75 mg total) by mouth daily.     clotrimazole-betamethasone cream  Commonly known as:  LOTRISONE  Apply 1 application topically 2 (two) times daily.     diazepam 5 MG tablet  Commonly known as:  VALIUM  Take 0.5-1 tablets (2.5-5 mg total) by mouth every 12 (twelve) hours as needed for anxiety.     docusate sodium 100 MG capsule  Commonly known as:  COLACE  Take 100 mg by mouth daily as needed for mild constipation.     losartan 50 MG tablet  Commonly known as:  COZAAR  Take 1 tablet every day.     multivitamin with minerals Tabs tablet  Take 1 tablet by mouth daily.     nitroGLYCERIN 0.4 MG SL tablet  Commonly known as:  NITROSTAT  Place 1 tablet (0.4 mg total) under the tongue every 5 (five) minutes as needed. For chest pain     oxyCODONE 5 MG immediate release tablet  Commonly known as:  Oxy IR/ROXICODONE  Take 1-2 tablets (5-10 mg total) by mouth every 4 (four) hours as needed for moderate pain, severe pain or breakthrough pain.     psyllium 58.6 % powder  Commonly known as:  METAMUCIL  Take 1 packet by mouth daily as needed.     ranitidine 150 MG capsule  Commonly known as:  ZANTAC  Take 150 mg by mouth as needed for  heartburn.     simvastatin 20 MG tablet  Commonly known as:  ZOCOR  Take 1 tablet (20 mg total) by mouth at bedtime.     vitamin B-12 1000 MCG tablet  Commonly known as:  CYANOCOBALAMIN  Take 1,000 mcg by mouth daily.     Vitamin D3 1000 UNITS Caps  Take 1 capsule by mouth daily.           Follow-up Information    Follow up with Roey Coopman C., MD In 3 weeks.   Specialty:  General Surgery   Why:  To follow up after your hospital stay, To follow up after your operation   Contact information:   [address] [place] [address] [phone]        Signed: Lorenso Courier, M.D., F.A.C.S. Gastrointestinal and Minimally Invasive Surgery [facility] [address] [place], [place] [address] [phone] Main / Paging   01/18/2015, 8:15 AM

## 2015-01-19 ENCOUNTER — Telehealth: Payer: Self-pay | Admitting: *Deleted

## 2015-01-19 ENCOUNTER — Encounter (HOSPITAL_COMMUNITY): Payer: Self-pay | Admitting: Surgery

## 2015-01-19 NOTE — Telephone Encounter (Signed)
Pt was on TCM list d/c from hosp 01/18/15 he had bilateral inguinal hernia. Pt will be f/u with Dr. Johney Maine in 3 weeks. Did not make TCM appt...Johny Chess

## 2015-01-26 DIAGNOSIS — Z961 Presence of intraocular lens: Secondary | ICD-10-CM | POA: Diagnosis not present

## 2015-02-01 ENCOUNTER — Ambulatory Visit (INDEPENDENT_AMBULATORY_CARE_PROVIDER_SITE_OTHER): Payer: Medicare Other | Admitting: Internal Medicine

## 2015-02-01 ENCOUNTER — Encounter: Payer: Self-pay | Admitting: Internal Medicine

## 2015-02-01 VITALS — BP 120/70 | HR 75 | Temp 98.1°F | Ht 68.0 in | Wt 159.2 lb

## 2015-02-01 DIAGNOSIS — E785 Hyperlipidemia, unspecified: Secondary | ICD-10-CM

## 2015-02-01 DIAGNOSIS — Z0000 Encounter for general adult medical examination without abnormal findings: Secondary | ICD-10-CM

## 2015-02-01 DIAGNOSIS — I25812 Atherosclerosis of bypass graft of coronary artery of transplanted heart without angina pectoris: Secondary | ICD-10-CM

## 2015-02-01 NOTE — Progress Notes (Signed)
Subjective:   HPI   The patient is here for a wellness exam. The patient has been doing well overall without major physical or psychological issues going on lately.  F/u hernia repair 2/16 Dr Michaell Cowing  The patient presents for a follow-up of  chronic BPH, hypertension, chronic dyslipidemia,CAD controlled with medicines   Kathie Rhodes - wife - [phone]  BP Readings from Last 3 Encounters:  02/01/15 120/70  01/18/15 137/62  12/21/14 148/82   Wt Readings from Last 3 Encounters:  02/01/15 159 lb 4 oz (72.235 kg)  01/17/15 163 lb 3.2 oz (74.027 kg)  12/21/14 160 lb (72.576 kg)      Review of Systems  Constitutional: Negative for appetite change, fatigue and unexpected weight change.  HENT: Negative for congestion, nosebleeds, sneezing and trouble swallowing.   Eyes: Negative for itching and visual disturbance.  Cardiovascular: Negative for palpitations and leg swelling.  Gastrointestinal: Negative for blood in stool and abdominal distention.  Genitourinary: Negative for hematuria.  Musculoskeletal: Negative for back pain, joint swelling, gait problem and neck pain.  Neurological: Negative for dizziness, tremors, speech difficulty and weakness.  Psychiatric/Behavioral: Negative for suicidal ideas, sleep disturbance, dysphoric mood and agitation. The patient is not nervous/anxious.    R groin scar     Objective:   Physical Exam  Lab Results  Component Value Date   WBC 7.8 01/17/2015   HGB 11.9* 01/17/2015   HCT 36.1* 01/17/2015   PLT 128* 01/17/2015   GLUCOSE 81 01/17/2015   CHOL 166 08/03/2014   TRIG 68.0 08/03/2014   HDL 45.80 08/03/2014   LDLCALC 107* 08/03/2014   ALT 31 08/03/2014   AST 34 08/03/2014   NA 136 01/17/2015   K 4.3 01/17/2015   CL 106 01/17/2015   CREATININE 1.09 01/17/2015   BUN 13 01/17/2015   CO2 23 01/17/2015   TSH 1.41 08/03/2014   PSA 10.39* 08/03/2014   INR 1.32 11/17/2012   HGBA1C 6.3* 11/17/2012         Assessment & Plan:

## 2015-02-01 NOTE — Progress Notes (Signed)
Pre visit review using our clinic review tool, if applicable. No additional management support is needed unless otherwise documented below in the visit note. 

## 2015-02-01 NOTE — Assessment & Plan Note (Signed)
Here for medicare wellness/physical  Diet: heart healthy  Physical activity: not sedentary  Depression/mood screen: negative  Hearing: intact to whispered voice  Visual acuity: grossly normal, performs annual eye exam  ADLs: capable  Fall risk: none  Home safety: good  Cognitive evaluation: intact to orientation, naming, recall and repetition  EOL planning: adv directives, full code/ I agree  I have personally reviewed and have noted  1. The patient's medical, surgical and social history  2. Their use of alcohol, tobacco or illicit drugs  3. Their current medications and supplements  4. The patient's functional ability including ADL's, fall risks, home safety risks and hearing or visual impairment.  5. Diet and physical activities  6. Evidence for depression or mood disorders    Today patient counseled on age appropriate routine health concerns for screening and prevention, each reviewed and up to date or declined. Immunizations reviewed and up to date or declined. Labs ordered and reviewed. Risk factors for depression reviewed and negative. Hearing function and visual acuity are intact. ADLs screened and addressed as needed. Functional ability and level of safety reviewed and appropriate. Education, counseling and referrals performed based on assessed risks today. Patient provided with a copy of personalized plan for preventive services.

## 2015-02-01 NOTE — Patient Instructions (Signed)
Preventive Care for Adults A healthy lifestyle and preventive care can promote health and wellness. Preventive health guidelines for men include the following key practices:  A routine yearly physical is a good way to check with your health care provider about your health and preventative screening. It is a chance to share any concerns and updates on your health and to receive a thorough exam.  Visit your dentist for a routine exam and preventative care every 6 months. Brush your teeth twice a day and floss once a day. Good oral hygiene prevents tooth decay and gum disease.  The frequency of eye exams is based on your age, health, family medical history, use of contact lenses, and other factors. Follow your health care provider's recommendations for frequency of eye exams.  Eat a healthy diet. Foods such as vegetables, fruits, whole grains, low-fat dairy products, and lean protein foods contain the nutrients you need without too many calories. Decrease your intake of foods high in solid fats, added sugars, and salt. Eat the right amount of calories for you.Get information about a proper diet from your health care provider, if necessary.  Regular physical exercise is one of the most important things you can do for your health. Most adults should get at least 150 minutes of moderate-intensity exercise (any activity that increases your heart rate and causes you to sweat) each week. In addition, most adults need muscle-strengthening exercises on 2 or more days a week.  Maintain a healthy weight. The body mass index (BMI) is a screening tool to identify possible weight problems. It provides an estimate of body fat based on height and weight. Your health care provider can find your BMI and can help you achieve or maintain a healthy weight.For adults 20 years and older:  A BMI below 18.5 is considered underweight.  A BMI of 18.5 to 24.9 is normal.  A BMI of 25 to 29.9 is considered overweight.  A BMI  of 30 and above is considered obese.  Maintain normal blood lipids and cholesterol levels by exercising and minimizing your intake of saturated fat. Eat a balanced diet with plenty of fruit and vegetables. Blood tests for lipids and cholesterol should begin at age 50 and be repeated every 5 years. If your lipid or cholesterol levels are high, you are over 50, or you are at high risk for heart disease, you may need your cholesterol levels checked more frequently.Ongoing high lipid and cholesterol levels should be treated with medicines if diet and exercise are not working.  If you smoke, find out from your health care provider how to quit. If you do not use tobacco, do not start.  Lung cancer screening is recommended for adults aged 73-80 years who are at high risk for developing lung cancer because of a history of smoking. A yearly low-dose CT scan of the lungs is recommended for people who have at least a 30-pack-year history of smoking and are a current smoker or have quit within the past 15 years. A pack year of smoking is smoking an average of 1 pack of cigarettes a day for 1 year (for example: 1 pack a day for 30 years or 2 packs a day for 15 years). Yearly screening should continue until the smoker has stopped smoking for at least 15 years. Yearly screening should be stopped for people who develop a health problem that would prevent them from having lung cancer treatment.  If you choose to drink alcohol, do not have more than  2 drinks per day. One drink is considered to be 12 ounces (355 mL) of beer, 5 ounces (148 mL) of wine, or 1.5 ounces (44 mL) of liquor.  Avoid use of street drugs. Do not share needles with anyone. Ask for help if you need support or instructions about stopping the use of drugs.  High blood pressure causes heart disease and increases the risk of stroke. Your blood pressure should be checked at least every 1-2 years. Ongoing high blood pressure should be treated with  medicines, if weight loss and exercise are not effective.  If you are 45-79 years old, ask your health care provider if you should take aspirin to prevent heart disease.  Diabetes screening involves taking a blood sample to check your fasting blood sugar level. This should be done once every 3 years, after age 45, if you are within normal weight and without risk factors for diabetes. Testing should be considered at a younger age or be carried out more frequently if you are overweight and have at least 1 risk factor for diabetes.  Colorectal cancer can be detected and often prevented. Most routine colorectal cancer screening begins at the age of 50 and continues through age 75. However, your health care provider may recommend screening at an earlier age if you have risk factors for colon cancer. On a yearly basis, your health care provider may provide home test kits to check for hidden blood in the stool. Use of a small camera at the end of a tube to directly examine the colon (sigmoidoscopy or colonoscopy) can detect the earliest forms of colorectal cancer. Talk to your health care provider about this at age 50, when routine screening begins. Direct exam of the colon should be repeated every 5-10 years through age 75, unless early forms of precancerous polyps or small growths are found.  People who are at an increased risk for hepatitis B should be screened for this virus. You are considered at high risk for hepatitis B if:  You were born in a country where hepatitis B occurs often. Talk with your health care provider about which countries are considered high risk.  Your parents were born in a high-risk country and you have not received a shot to protect against hepatitis B (hepatitis B vaccine).  You have HIV or AIDS.  You use needles to inject street drugs.  You live with, or have sex with, someone who has hepatitis B.  You are a man who has sex with other men (MSM).  You get hemodialysis  treatment.  You take certain medicines for conditions such as cancer, organ transplantation, and autoimmune conditions.  Hepatitis C blood testing is recommended for all people born from 1945 through 1965 and any individual with known risks for hepatitis C.  Practice safe sex. Use condoms and avoid high-risk sexual practices to reduce the spread of sexually transmitted infections (STIs). STIs include gonorrhea, chlamydia, syphilis, trichomonas, herpes, HPV, and human immunodeficiency virus (HIV). Herpes, HIV, and HPV are viral illnesses that have no cure. They can result in disability, cancer, and death.  If you are at risk of being infected with HIV, it is recommended that you take a prescription medicine daily to prevent HIV infection. This is called preexposure prophylaxis (PrEP). You are considered at risk if:  You are a man who has sex with other men (MSM) and have other risk factors.  You are a heterosexual man, are sexually active, and are at increased risk for HIV infection.    You take drugs by injection.  You are sexually active with a partner who has HIV.  Talk with your health care provider about whether you are at high risk of being infected with HIV. If you choose to begin PrEP, you should first be tested for HIV. You should then be tested every 3 months for as long as you are taking PrEP.  A one-time screening for abdominal aortic aneurysm (AAA) and surgical repair of large AAAs by ultrasound are recommended for men ages 32 to 67 years who are current or former smokers.  Healthy men should no longer receive prostate-specific antigen (PSA) blood tests as part of routine cancer screening. Talk with your health care provider about prostate cancer screening.  Testicular cancer screening is not recommended for adult males who have no symptoms. Screening includes self-exam, a health care provider exam, and other screening tests. Consult with your health care provider about any symptoms  you have or any concerns you have about testicular cancer.  Use sunscreen. Apply sunscreen liberally and repeatedly throughout the day. You should seek shade when your shadow is shorter than you. Protect yourself by wearing long sleeves, pants, a wide-brimmed hat, and sunglasses year round, whenever you are outdoors.  Once a month, do a whole-body skin exam, using a mirror to look at the skin on your back. Tell your health care provider about new moles, moles that have irregular borders, moles that are larger than a pencil eraser, or moles that have changed in shape or color.  Stay current with required vaccines (immunizations).  Influenza vaccine. All adults should be immunized every year.  Tetanus, diphtheria, and acellular pertussis (Td, Tdap) vaccine. An adult who has not previously received Tdap or who does not know his vaccine status should receive 1 dose of Tdap. This initial dose should be followed by tetanus and diphtheria toxoids (Td) booster doses every 10 years. Adults with an unknown or incomplete history of completing a 3-dose immunization series with Td-containing vaccines should begin or complete a primary immunization series including a Tdap dose. Adults should receive a Td booster every 10 years.  Varicella vaccine. An adult without evidence of immunity to varicella should receive 2 doses or a second dose if he has previously received 1 dose.  Human papillomavirus (HPV) vaccine. Males aged 68-21 years who have not received the vaccine previously should receive the 3-dose series. Males aged 22-26 years may be immunized. Immunization is recommended through the age of 6 years for any male who has sex with males and did not get any or all doses earlier. Immunization is recommended for any person with an immunocompromised condition through the age of 49 years if he did not get any or all doses earlier. During the 3-dose series, the second dose should be obtained 4-8 weeks after the first  dose. The third dose should be obtained 24 weeks after the first dose and 16 weeks after the second dose.  Zoster vaccine. One dose is recommended for adults aged 50 years or older unless certain conditions are present.  Measles, mumps, and rubella (MMR) vaccine. Adults born before [birth year] generally are considered immune to measles and mumps. Adults born in [birth year] or later should have 1 or more doses of MMR vaccine unless there is a contraindication to the vaccine or there is laboratory evidence of immunity to each of the three diseases. A routine second dose of MMR vaccine should be obtained at least 28 days after the first dose for students attending postsecondary  schools, health care workers, or international travelers. People who received inactivated measles vaccine or an unknown type of measles vaccine during 1963-1967 should receive 2 doses of MMR vaccine. People who received inactivated mumps vaccine or an unknown type of mumps vaccine before 1979 and are at high risk for mumps infection should consider immunization with 2 doses of MMR vaccine. Unvaccinated health care workers born before 1957 who lack laboratory evidence of measles, mumps, or rubella immunity or laboratory confirmation of disease should consider measles and mumps immunization with 2 doses of MMR vaccine or rubella immunization with 1 dose of MMR vaccine.  Pneumococcal 13-valent conjugate (PCV13) vaccine. When indicated, a person who is uncertain of his immunization history and has no record of immunization should receive the PCV13 vaccine. An adult aged 19 years or older who has certain medical conditions and has not been previously immunized should receive 1 dose of PCV13 vaccine. This PCV13 should be followed with a dose of pneumococcal polysaccharide (PPSV23) vaccine. The PPSV23 vaccine dose should be obtained at least 8 weeks after the dose of PCV13 vaccine. An adult aged 19 years or older who has certain medical conditions and  previously received 1 or more doses of PPSV23 vaccine should receive 1 dose of PCV13. The PCV13 vaccine dose should be obtained 1 or more years after the last PPSV23 vaccine dose.  Pneumococcal polysaccharide (PPSV23) vaccine. When PCV13 is also indicated, PCV13 should be obtained first. All adults aged 65 years and older should be immunized. An adult younger than age 65 years who has certain medical conditions should be immunized. Any person who resides in a nursing home or long-term care facility should be immunized. An adult smoker should be immunized. People with an immunocompromised condition and certain other conditions should receive both PCV13 and PPSV23 vaccines. People with human immunodeficiency virus (HIV) infection should be immunized as soon as possible after diagnosis. Immunization during chemotherapy or radiation therapy should be avoided. Routine use of PPSV23 vaccine is not recommended for American Indians, [place] Natives, or people younger than 65 years unless there are medical conditions that require PPSV23 vaccine. When indicated, people who have unknown immunization and have no record of immunization should receive PPSV23 vaccine. One-time revaccination 5 years after the first dose of PPSV23 is recommended for people aged 19-64 years who have chronic kidney failure, nephrotic syndrome, asplenia, or immunocompromised conditions. People who received 1-2 doses of PPSV23 before age 65 years should receive another dose of PPSV23 vaccine at age 65 years or later if at least 5 years have passed since the previous dose. Doses of PPSV23 are not needed for people immunized with PPSV23 at or after age 65 years.  Meningococcal vaccine. Adults with asplenia or persistent complement component deficiencies should receive 2 doses of quadrivalent meningococcal conjugate (MenACWY-D) vaccine. The doses should be obtained at least 2 months apart. Microbiologists working with certain meningococcal bacteria,  military recruits, people at risk during an outbreak, and people who travel to or live in countries with a high rate of meningitis should be immunized. A first-year college student up through age 21 years who is living in a residence hall should receive a dose if he did not receive a dose on or after his 16th birthday. Adults who have certain high-risk conditions should receive one or more doses of vaccine.  Hepatitis A vaccine. Adults who wish to be protected from this disease, have certain high-risk conditions, work with hepatitis A-infected animals, work in hepatitis A research labs, or   travel to or work in countries with a high rate of hepatitis A should be immunized. Adults who were previously unvaccinated and who anticipate close contact with an international adoptee during the first 60 days after arrival in the [place] from a country with a high rate of hepatitis A should be immunized.  Hepatitis B vaccine. Adults should be immunized if they wish to be protected from this disease, have certain high-risk conditions, may be exposed to blood or other infectious body fluids, are household contacts or sex partners of hepatitis B positive people, are clients or workers in certain care facilities, or travel to or work in countries with a high rate of hepatitis B.  Haemophilus influenzae type b (Hib) vaccine. A previously unvaccinated person with asplenia or sickle cell disease or having a scheduled splenectomy should receive 1 dose of Hib vaccine. Regardless of previous immunization, a recipient of a hematopoietic stem cell transplant should receive a 3-dose series 6-12 months after his successful transplant. Hib vaccine is not recommended for adults with HIV infection. Preventive Service / Frequency Ages 52 to 17  Blood pressure check.** / Every 1 to 2 years.  Lipid and cholesterol check.** / Every 5 years beginning at age 69.  Hepatitis C blood test.** / For any individual with known risks for  hepatitis C.  Skin self-exam. / Monthly.  Influenza vaccine. / Every year.  Tetanus, diphtheria, and acellular pertussis (Tdap, Td) vaccine.** / Consult your health care provider. 1 dose of Td every 10 years.  Varicella vaccine.** / Consult your health care provider.  HPV vaccine. / 3 doses over 6 months, if 72 or younger.  Measles, mumps, rubella (MMR) vaccine.** / You need at least 1 dose of MMR if you were born in 1957 or later. You may also need a second dose.  Pneumococcal 13-valent conjugate (PCV13) vaccine.** / Consult your health care provider.  Pneumococcal polysaccharide (PPSV23) vaccine.** / 1 to 2 doses if you smoke cigarettes or if you have certain conditions.  Meningococcal vaccine.** / 1 dose if you are age 35 to 60 years and a Market researcher living in a residence hall, or have one of several medical conditions. You may also need additional booster doses.  Hepatitis A vaccine.** / Consult your health care provider.  Hepatitis B vaccine.** / Consult your health care provider.  Haemophilus influenzae type b (Hib) vaccine.** / Consult your health care provider. Ages 35 to 8  Blood pressure check.** / Every 1 to 2 years.  Lipid and cholesterol check.** / Every 5 years beginning at age 57.  Lung cancer screening. / Every year if you are aged 44-80 years and have a 30-pack-year history of smoking and currently smoke or have quit within the past 15 years. Yearly screening is stopped once you have quit smoking for at least 15 years or develop a health problem that would prevent you from having lung cancer treatment.  Fecal occult blood test (FOBT) of stool. / Every year beginning at age 55 and continuing until age 73. You may not have to do this test if you get a colonoscopy every 10 years.  Flexible sigmoidoscopy** or colonoscopy.** / Every 5 years for a flexible sigmoidoscopy or every 10 years for a colonoscopy beginning at age 28 and continuing until age  1.  Hepatitis C blood test.** / For all people born from [birth year] through 1965 and any individual with known risks for hepatitis C.  Skin self-exam. / Monthly.  Influenza vaccine. / Every  year.  Tetanus, diphtheria, and acellular pertussis (Tdap/Td) vaccine.** / Consult your health care provider. 1 dose of Td every 10 years.  Varicella vaccine.** / Consult your health care provider.  Zoster vaccine.** / 1 dose for adults aged 53 years or older.  Measles, mumps, rubella (MMR) vaccine.** / You need at least 1 dose of MMR if you were born in 1957 or later. You may also need a second dose.  Pneumococcal 13-valent conjugate (PCV13) vaccine.** / Consult your health care provider.  Pneumococcal polysaccharide (PPSV23) vaccine.** / 1 to 2 doses if you smoke cigarettes or if you have certain conditions.  Meningococcal vaccine.** / Consult your health care provider.  Hepatitis A vaccine.** / Consult your health care provider.  Hepatitis B vaccine.** / Consult your health care provider.  Haemophilus influenzae type b (Hib) vaccine.** / Consult your health care provider. Ages 77 and over  Blood pressure check.** / Every 1 to 2 years.  Lipid and cholesterol check.**/ Every 5 years beginning at age 85.  Lung cancer screening. / Every year if you are aged 55-80 years and have a 30-pack-year history of smoking and currently smoke or have quit within the past 15 years. Yearly screening is stopped once you have quit smoking for at least 15 years or develop a health problem that would prevent you from having lung cancer treatment.  Fecal occult blood test (FOBT) of stool. / Every year beginning at age 33 and continuing until age 11. You may not have to do this test if you get a colonoscopy every 10 years.  Flexible sigmoidoscopy** or colonoscopy.** / Every 5 years for a flexible sigmoidoscopy or every 10 years for a colonoscopy beginning at age 28 and continuing until age 73.  Hepatitis C blood  test.** / For all people born from [birth year] through 1965 and any individual with known risks for hepatitis C.  Abdominal aortic aneurysm (AAA) screening.** / A one-time screening for ages 50 to 27 years who are current or former smokers.  Skin self-exam. / Monthly.  Influenza vaccine. / Every year.  Tetanus, diphtheria, and acellular pertussis (Tdap/Td) vaccine.** / 1 dose of Td every 10 years.  Varicella vaccine.** / Consult your health care provider.  Zoster vaccine.** / 1 dose for adults aged 34 years or older.  Pneumococcal 13-valent conjugate (PCV13) vaccine.** / Consult your health care provider.  Pneumococcal polysaccharide (PPSV23) vaccine.** / 1 dose for all adults aged 63 years and older.  Meningococcal vaccine.** / Consult your health care provider.  Hepatitis A vaccine.** / Consult your health care provider.  Hepatitis B vaccine.** / Consult your health care provider.  Haemophilus influenzae type b (Hib) vaccine.** / Consult your health care provider. **Family history and personal history of risk and conditions may change your health care provider's recommendations. Document Released: 01/07/2002 Document Revised: 11/16/2013 Document Reviewed: 04/08/2011 [facility] Patient Information 2015 [place], [place]. This information is not intended to replace advice given to you by your health care provider. Make sure you discuss any questions you have with your health care provider.

## 2015-02-02 ENCOUNTER — Other Ambulatory Visit (INDEPENDENT_AMBULATORY_CARE_PROVIDER_SITE_OTHER): Payer: Medicare Other

## 2015-02-02 DIAGNOSIS — E785 Hyperlipidemia, unspecified: Secondary | ICD-10-CM

## 2015-02-02 DIAGNOSIS — I25812 Atherosclerosis of bypass graft of coronary artery of transplanted heart without angina pectoris: Secondary | ICD-10-CM | POA: Diagnosis not present

## 2015-02-02 DIAGNOSIS — I10 Essential (primary) hypertension: Secondary | ICD-10-CM | POA: Diagnosis not present

## 2015-02-02 DIAGNOSIS — Z0000 Encounter for general adult medical examination without abnormal findings: Secondary | ICD-10-CM

## 2015-02-02 LAB — HEPATIC FUNCTION PANEL
ALT: 17 U/L (ref 0–53)
AST: 23 U/L (ref 0–37)
Albumin: 4.1 g/dL (ref 3.5–5.2)
Alkaline Phosphatase: 74 U/L (ref 39–117)
Bilirubin, Direct: 0.2 mg/dL (ref 0.0–0.3)
Total Bilirubin: 0.7 mg/dL (ref 0.2–1.2)
Total Protein: 7.5 g/dL (ref 6.0–8.3)

## 2015-02-02 LAB — URINALYSIS
Bilirubin Urine: NEGATIVE
Hgb urine dipstick: NEGATIVE
Ketones, ur: NEGATIVE
Leukocytes, UA: NEGATIVE
Nitrite: NEGATIVE
Specific Gravity, Urine: 1.015 (ref 1.000–1.030)
Total Protein, Urine: NEGATIVE
Urine Glucose: NEGATIVE
Urobilinogen, UA: 0.2 (ref 0.0–1.0)
pH: 6 (ref 5.0–8.0)

## 2015-02-02 LAB — CBC WITH DIFFERENTIAL/PLATELET
Basophils Absolute: 0 10*3/uL (ref 0.0–0.1)
Basophils Relative: 0.4 % (ref 0.0–3.0)
Eosinophils Absolute: 0.1 10*3/uL (ref 0.0–0.7)
Eosinophils Relative: 2.4 % (ref 0.0–5.0)
HCT: 37.3 % — ABNORMAL LOW (ref 39.0–52.0)
Hemoglobin: 12.7 g/dL — ABNORMAL LOW (ref 13.0–17.0)
Lymphocytes Relative: 29.9 % (ref 12.0–46.0)
Lymphs Abs: 1.7 10*3/uL (ref 0.7–4.0)
MCHC: 33.9 g/dL (ref 30.0–36.0)
MCV: 88.6 fl (ref 78.0–100.0)
Monocytes Absolute: 0.4 10*3/uL (ref 0.1–1.0)
Monocytes Relative: 6.7 % (ref 3.0–12.0)
Neutro Abs: 3.5 10*3/uL (ref 1.4–7.7)
Neutrophils Relative %: 60.6 % (ref 43.0–77.0)
Platelets: 138 10*3/uL — ABNORMAL LOW (ref 150.0–400.0)
RBC: 4.21 Mil/uL — ABNORMAL LOW (ref 4.22–5.81)
RDW: 13.8 % (ref 11.5–15.5)
WBC: 5.8 10*3/uL (ref 4.0–10.5)

## 2015-02-02 LAB — BASIC METABOLIC PANEL
BUN: 16 mg/dL (ref 6–23)
CO2: 28 mEq/L (ref 19–32)
Calcium: 10.6 mg/dL — ABNORMAL HIGH (ref 8.4–10.5)
Chloride: 101 mEq/L (ref 96–112)
Creatinine, Ser: 1.13 mg/dL (ref 0.40–1.50)
GFR: 66.31 mL/min (ref >60.00)
Glucose, Bld: 95 mg/dL (ref 70–99)
Potassium: 4.5 mEq/L (ref 3.5–5.1)
Sodium: 133 mEq/L — ABNORMAL LOW (ref 135–145)

## 2015-02-02 LAB — LIPID PANEL
Cholesterol: 161 mg/dL (ref 0–200)
HDL: 46.3 mg/dL (ref >39.00)
LDL Cholesterol: 100 mg/dL — ABNORMAL HIGH (ref 0–99)
NonHDL: 114.7
Total CHOL/HDL Ratio: 3
Triglycerides: 74 mg/dL (ref 0.0–149.0)
VLDL: 14.8 mg/dL (ref 0.0–40.0)

## 2015-02-02 LAB — TSH: TSH: 1.78 u[IU]/mL (ref 0.35–4.50)

## 2015-02-28 ENCOUNTER — Other Ambulatory Visit: Payer: Self-pay | Admitting: *Deleted

## 2015-02-28 MED ORDER — CLOPIDOGREL BISULFATE 75 MG PO TABS: 75.0000 mg | ORAL_TABLET | Freq: Every day | ORAL | Status: DC

## 2015-03-02 DIAGNOSIS — D1800 Hemangioma unspecified site: Secondary | ICD-10-CM | POA: Diagnosis not present

## 2015-03-02 DIAGNOSIS — D485 Neoplasm of uncertain behavior of skin: Secondary | ICD-10-CM | POA: Diagnosis not present

## 2015-03-02 DIAGNOSIS — D229 Melanocytic nevi, unspecified: Secondary | ICD-10-CM | POA: Diagnosis not present

## 2015-03-02 DIAGNOSIS — L821 Other seborrheic keratosis: Secondary | ICD-10-CM | POA: Diagnosis not present

## 2015-03-02 DIAGNOSIS — L578 Other skin changes due to chronic exposure to nonionizing radiation: Secondary | ICD-10-CM | POA: Diagnosis not present

## 2015-03-02 DIAGNOSIS — Z1283 Encounter for screening for malignant neoplasm of skin: Secondary | ICD-10-CM | POA: Diagnosis not present

## 2015-03-02 DIAGNOSIS — L300 Nummular dermatitis: Secondary | ICD-10-CM | POA: Diagnosis not present

## 2015-03-02 DIAGNOSIS — L820 Inflamed seborrheic keratosis: Secondary | ICD-10-CM | POA: Diagnosis not present

## 2015-03-02 DIAGNOSIS — L905 Scar conditions and fibrosis of skin: Secondary | ICD-10-CM | POA: Diagnosis not present

## 2015-03-18 NOTE — Op Note (Signed)
PATIENT NAME:  [name] Christensen, DELORENZO MR#:  [id number] DATE OF BIRTH:  1931-12-07  DATE OF PROCEDURE:  06/14/2014  PREOPERATIVE DIAGNOSIS: Visually significant cataract of the left eye.   POSTOPERATIVE DIAGNOSIS: Visually significant cataract of the left eye.   OPERATIVE PROCEDURE: Cataract extraction by phacoemulsification with implant of intraocular lens to left eye.   SURGEON: Birder Robson, MD.   ANESTHESIA:  1. Managed anesthesia care.  2. Topical tetracaine drops followed by 2% Xylocaine jelly applied in the preoperative holding area.   COMPLICATIONS: None.   TECHNIQUE:  Stop and chop.  DESCRIPTION OF PROCEDURE: The patient was examined and consented in the preoperative holding area where the aforementioned topical anesthesia was applied to the left eye and then brought back to the Operating Room where the left eye was prepped and draped in the usual sterile ophthalmic fashion and a lid speculum was placed. A paracentesis was created with the side port blade and the anterior chamber was filled with viscoelastic. A near clear corneal incision was performed with the steel keratome. A continuous curvilinear capsulorrhexis was performed with a cystotome followed by the capsulorrhexis forceps. Hydrodissection and hydrodelineation were carried out with BSS on a blunt cannula. The lens was removed in a stop and chop technique and the remaining cortical material was removed with the irrigation-aspiration handpiece. The capsular bag was inflated with viscoelastic and the Tecnis ZCBOO-21.0-diopter lens, serial number [id number] was placed in the capsular bag without complication. The remaining viscoelastic was removed from the eye with the irrigation-aspiration handpiece. The wounds were hydrated. The anterior chamber was flushed with Miostat and the eye was inflated to physiologic pressure. Please note that cefuroxime was not placed in the eye due to penicillin allergy, rather a 4:1 dilution of Vigamox  was used. The wounds were found to be water tight. The eye was dressed with Vigamox. The patient was given protective glasses to wear throughout the day and a shield with which to sleep tonight. The patient was also given drops with which to begin a drop regimen today and will follow-up with me in one day.    ____________________________ Livingston Diones. Teodor Prater, MD wlp:lt D: 06/15/2014 07:53:56 ET T: 06/15/2014 08:58:58 ET JOB#: [id number]  cc: Bonnell Placzek L. Ileana Chalupa, MD, <Dictator> Livingston Diones Ryeleigh Santore MD ELECTRONICALLY SIGNED 06/22/2014 17:11

## 2015-03-22 ENCOUNTER — Telehealth: Payer: Self-pay | Admitting: Cardiology

## 2015-03-22 ENCOUNTER — Telehealth: Payer: Self-pay | Admitting: Internal Medicine

## 2015-03-22 ENCOUNTER — Encounter: Payer: Medicare Other | Admitting: *Deleted

## 2015-03-22 NOTE — Telephone Encounter (Signed)
New message        Calling to see how do you do a remote transmission?

## 2015-03-22 NOTE — Telephone Encounter (Signed)
Instructed pt how to send a manual transmission.

## 2015-03-22 NOTE — Telephone Encounter (Signed)
Confirmed remote transmission w/ pt wife.   

## 2015-03-23 ENCOUNTER — Encounter: Payer: Self-pay | Admitting: Cardiology

## 2015-03-24 ENCOUNTER — Ambulatory Visit (INDEPENDENT_AMBULATORY_CARE_PROVIDER_SITE_OTHER): Payer: Medicare Other | Admitting: *Deleted

## 2015-03-24 ENCOUNTER — Telehealth: Payer: Self-pay | Admitting: Internal Medicine

## 2015-03-24 ENCOUNTER — Encounter: Payer: Medicare Other | Admitting: *Deleted

## 2015-03-24 DIAGNOSIS — I469 Cardiac arrest, cause unspecified: Secondary | ICD-10-CM | POA: Diagnosis not present

## 2015-03-24 LAB — CUP PACEART REMOTE DEVICE CHECK
Battery Remaining Longevity: 78 mo
Battery Remaining Percentage: 79 %
Battery Voltage: 2.98 V
Brady Statistic RV Percent Paced: 1 %
Date Time Interrogation Session: 20160429212724
HighPow Impedance: 75 Ohm
HighPow Impedance: 75 Ohm
Lead Channel Impedance Value: 360 Ohm
Lead Channel Pacing Threshold Amplitude: 1.25 V
Lead Channel Pacing Threshold Pulse Width: 0.8 ms
Lead Channel Sensing Intrinsic Amplitude: 6.7 mV
Lead Channel Setting Pacing Amplitude: 2.5 V
Lead Channel Setting Pacing Pulse Width: 0.8 ms
Lead Channel Setting Sensing Sensitivity: 0.5 mV
Pulse Gen Serial Number: 7048849
Zone Setting Detection Interval: 250 ms
Zone Setting Detection Interval: 300 ms

## 2015-03-24 NOTE — Telephone Encounter (Signed)
Informed pt that transmission was not received. Pt is at work from 7:30 - 5:00 Monday through Friday. Pt agreed to call tech services tonight.

## 2015-03-24 NOTE — Telephone Encounter (Signed)
LMOVM for pt to return call 

## 2015-03-24 NOTE — Telephone Encounter (Signed)
New Message  Pt wanted tos peak w/ Device to confirm his remote signal was submitted successfully. Also to see if he can do them automatically and not have to stand by the machine.  Please call and dsicuss.

## 2015-03-28 ENCOUNTER — Telehealth: Payer: Self-pay | Admitting: Internal Medicine

## 2015-03-28 NOTE — Telephone Encounter (Signed)
LMOVM for pt to return call 

## 2015-03-28 NOTE — Telephone Encounter (Signed)
°  1. Has your device fired? n 2. Is you device beeping? n 3. Are you experiencing draining or swelling at device site? n 4. Are you calling to see if we received your device transmission? y 5. Have you passed out? n  Comment: PT called states that he sent his signal manually as instructed. Please call back to discuss if the signal was received and if we have received all of his signals properly within the last 2 years. Pt states that he was originally informed that his transmissions would complete automatically. Now he is fearful that he hasnt sent them properly for the past 2 years. Please call

## 2015-03-29 NOTE — Progress Notes (Signed)
Remote ICD transmission.   

## 2015-03-29 NOTE — Telephone Encounter (Signed)
Spoke w/ pt and informed that transmission was received and that we have received transmission in the past. Pt verbalized understanding.

## 2015-04-17 ENCOUNTER — Encounter: Payer: Self-pay | Admitting: Cardiology

## 2015-04-25 ENCOUNTER — Encounter: Payer: Self-pay | Admitting: Internal Medicine

## 2015-05-03 ENCOUNTER — Encounter: Payer: Self-pay | Admitting: Cardiology

## 2015-05-18 NOTE — Addendum Note (Signed)
Addended by: Claude Manges on: 05/18/2015 09:12 AM   Modules accepted: Orders

## 2015-05-27 ENCOUNTER — Encounter (HOSPITAL_COMMUNITY): Payer: Self-pay | Admitting: *Deleted

## 2015-05-27 ENCOUNTER — Emergency Department (HOSPITAL_COMMUNITY)
Admission: EM | Admit: 2015-05-27 | Discharge: 2015-05-28 | Disposition: A | Discharge status: Home / Self Care | Payer: Medicare Other | Attending: Emergency Medicine | Admitting: Emergency Medicine

## 2015-05-27 DIAGNOSIS — Z880 Allergy status to penicillin: Secondary | ICD-10-CM | POA: Diagnosis not present

## 2015-05-27 DIAGNOSIS — K219 Gastro-esophageal reflux disease without esophagitis: Secondary | ICD-10-CM | POA: Insufficient documentation

## 2015-05-27 DIAGNOSIS — Z9889 Other specified postprocedural states: Secondary | ICD-10-CM | POA: Diagnosis not present

## 2015-05-27 DIAGNOSIS — N390 Urinary tract infection, site not specified: Secondary | ICD-10-CM | POA: Diagnosis not present

## 2015-05-27 DIAGNOSIS — I10 Essential (primary) hypertension: Secondary | ICD-10-CM | POA: Insufficient documentation

## 2015-05-27 DIAGNOSIS — E785 Hyperlipidemia, unspecified: Secondary | ICD-10-CM | POA: Diagnosis not present

## 2015-05-27 DIAGNOSIS — Z7982 Long term (current) use of aspirin: Secondary | ICD-10-CM | POA: Diagnosis not present

## 2015-05-27 DIAGNOSIS — Z95810 Presence of automatic (implantable) cardiac defibrillator: Secondary | ICD-10-CM | POA: Insufficient documentation

## 2015-05-27 DIAGNOSIS — Z8719 Personal history of other diseases of the digestive system: Secondary | ICD-10-CM | POA: Insufficient documentation

## 2015-05-27 DIAGNOSIS — Z8674 Personal history of sudden cardiac arrest: Secondary | ICD-10-CM | POA: Insufficient documentation

## 2015-05-27 DIAGNOSIS — Z7901 Long term (current) use of anticoagulants: Secondary | ICD-10-CM | POA: Diagnosis not present

## 2015-05-27 DIAGNOSIS — I2510 Atherosclerotic heart disease of native coronary artery without angina pectoris: Secondary | ICD-10-CM | POA: Diagnosis not present

## 2015-05-27 DIAGNOSIS — R319 Hematuria, unspecified: Secondary | ICD-10-CM | POA: Diagnosis not present

## 2015-05-27 DIAGNOSIS — Z79899 Other long term (current) drug therapy: Secondary | ICD-10-CM | POA: Insufficient documentation

## 2015-05-27 DIAGNOSIS — Z951 Presence of aortocoronary bypass graft: Secondary | ICD-10-CM | POA: Diagnosis not present

## 2015-05-27 LAB — COMPREHENSIVE METABOLIC PANEL
ALT: 31 U/L (ref 17–63)
AST: 40 U/L (ref 15–41)
Albumin: 3.8 g/dL (ref 3.5–5.0)
Alkaline Phosphatase: 110 U/L (ref 38–126)
Anion gap: 6 (ref 5–15)
BUN: 11 mg/dL (ref 6–20)
CO2: 27 mmol/L (ref 22–32)
Calcium: 10.4 mg/dL — ABNORMAL HIGH (ref 8.9–10.3)
Chloride: 100 mmol/L — ABNORMAL LOW (ref 101–111)
Creatinine, Ser: 1.14 mg/dL (ref 0.61–1.24)
GFR calc Af Amer: >60 mL/min (ref >60)
GFR calc non Af Amer: 59 mL/min — ABNORMAL LOW (ref >60)
Glucose, Bld: 111 mg/dL — ABNORMAL HIGH (ref 65–99)
Potassium: 4.8 mmol/L (ref 3.5–5.1)
Sodium: 133 mmol/L — ABNORMAL LOW (ref 135–145)
Total Bilirubin: 0.9 mg/dL (ref 0.3–1.2)
Total Protein: 7.5 g/dL (ref 6.5–8.1)

## 2015-05-27 LAB — CBC WITH DIFFERENTIAL/PLATELET
Basophils Absolute: 0 10*3/uL (ref 0.0–0.1)
Basophils Relative: 0 % (ref 0–1)
Eosinophils Absolute: 0.2 10*3/uL (ref 0.0–0.7)
Eosinophils Relative: 2 % (ref 0–5)
HCT: 37.4 % — ABNORMAL LOW (ref 39.0–52.0)
Hemoglobin: 12.6 g/dL — ABNORMAL LOW (ref 13.0–17.0)
Lymphocytes Relative: 30 % (ref 12–46)
Lymphs Abs: 2.3 10*3/uL (ref 0.7–4.0)
MCH: 30.3 pg (ref 26.0–34.0)
MCHC: 33.7 g/dL (ref 30.0–36.0)
MCV: 89.9 fL (ref 78.0–100.0)
Monocytes Absolute: 0.5 10*3/uL (ref 0.1–1.0)
Monocytes Relative: 7 % (ref 3–12)
Neutro Abs: 4.5 10*3/uL (ref 1.7–7.7)
Neutrophils Relative %: 61 % (ref 43–77)
Platelets: 141 10*3/uL — ABNORMAL LOW (ref 150–400)
RBC: 4.16 MIL/uL — ABNORMAL LOW (ref 4.22–5.81)
RDW: 13.4 % (ref 11.5–15.5)
WBC: 7.5 10*3/uL (ref 4.0–10.5)

## 2015-05-27 LAB — URINALYSIS, ROUTINE W REFLEX MICROSCOPIC
Glucose, UA: NEGATIVE mg/dL
Ketones, ur: 15 mg/dL — AB
Nitrite: POSITIVE — AB
Protein, ur: 100 mg/dL — AB
Specific Gravity, Urine: 1.011 (ref 1.005–1.030)
Urobilinogen, UA: 1 mg/dL (ref 0.0–1.0)
pH: 7.5 (ref 5.0–8.0)

## 2015-05-27 LAB — PROTIME-INR
INR: 1.18 (ref 0.00–1.49)
Prothrombin Time: 15.2 seconds (ref 11.6–15.2)

## 2015-05-27 LAB — URINE MICROSCOPIC-ADD ON

## 2015-05-27 MED ORDER — CIPROFLOXACIN HCL 500 MG PO TABS: 500.0000 mg | ORAL_TABLET | Freq: Once | ORAL | Status: DC

## 2015-05-27 MED ORDER — CIPROFLOXACIN HCL 500 MG PO TABS
500.0000 mg | ORAL_TABLET | Freq: Once | ORAL | Status: AC
  Administered 2015-05-27: 500 mg via ORAL
  Filled 2015-05-27: qty 1

## 2015-05-27 MED ORDER — HYDROCODONE-ACETAMINOPHEN 5-325 MG PO TABS
1.0000 | ORAL_TABLET | Freq: Once | ORAL | Status: AC
  Administered 2015-05-27: 1 via ORAL
  Filled 2015-05-27: qty 1

## 2015-05-27 MED ORDER — HYDROCODONE-ACETAMINOPHEN 5-325 MG PO TABS: 1.0000 | ORAL_TABLET | Freq: Once | ORAL | Status: DC

## 2015-05-27 NOTE — ED Notes (Signed)
Dr. Audie Pinto in room with patient at this time.

## 2015-05-27 NOTE — Discharge Instructions (Signed)
Hematuria °Hematuria is blood in your urine. It can be caused by a bladder infection, kidney infection, prostate infection, kidney stone, or cancer of your urinary tract. Infections can usually be treated with medicine, and a kidney stone usually will pass through your urine. If neither of these is the cause of your hematuria, further workup to find out the reason may be needed. °It is very important that you tell your health care provider about any blood you see in your urine, even if the blood stops without treatment or happens without causing pain. Blood in your urine that happens and then stops and then happens again can be a symptom of a very serious condition. Also, pain is not a symptom in the initial stages of many urinary cancers. °HOME CARE INSTRUCTIONS  °· Drink lots of fluid, 3-4 quarts a day. If you have been diagnosed with an infection, cranberry juice is especially recommended, in addition to large amounts of water. °· Avoid caffeine, tea, and carbonated beverages because they tend to irritate the bladder. °· Avoid alcohol because it may irritate the prostate. °· Take all medicines as directed by your health care provider. °· If you were prescribed an antibiotic medicine, finish it all even if you start to feel better. °· If you have been diagnosed with a kidney stone, follow your health care provider's instructions regarding straining your urine to catch the stone. °· Empty your bladder often. Avoid holding urine for long periods of time. °· After a bowel movement, women should cleanse front to back. Use each tissue only once. °· Empty your bladder before and after sexual intercourse if you are a male. °SEEK MEDICAL CARE IF: °· You develop back pain. °· You have a fever. °· You have a feeling of sickness in your stomach (nausea) or vomiting. °· Your symptoms are not better in 3 days. Return sooner if you are getting worse. °SEEK IMMEDIATE MEDICAL CARE IF:  °· You develop severe vomiting and are  unable to keep the medicine down. °· You develop severe back or abdominal pain despite taking your medicines. °· You begin passing a large amount of blood or clots in your urine. °· You feel extremely weak or faint, or you pass out. °MAKE SURE YOU:  °· Understand these instructions. °· Will watch your condition. °· Will get help right away if you are not doing well or get worse. °Document Released: 11/11/2005 Document Revised: 03/28/2014 Document Reviewed: 07/22/2013 °ExitCare® Patient Information ©2015 [employer]. This information is not intended to replace advice given to you by your health care provider. Make sure you discuss any questions you have with your health care provider. ° °Urinary Tract Infection °Urinary tract infections (UTIs) can develop anywhere along your urinary tract. Your urinary tract is your body's drainage system for removing wastes and extra water. Your urinary tract includes two kidneys, two ureters, a bladder, and a urethra. Your kidneys are a pair of bean-shaped organs. Each kidney is about the size of your fist. They are located below your ribs, one on each side of your spine. °CAUSES °Infections are caused by microbes, which are microscopic organisms, including fungi, viruses, and bacteria. These organisms are so small that they can only be seen through a microscope. Bacteria are the microbes that most commonly cause UTIs. °SYMPTOMS  °Symptoms of UTIs may vary by age and gender of the patient and by the location of the infection. Symptoms in young women typically include a frequent and intense urge to urinate and   a painful, burning feeling in the bladder or urethra during urination. Older women and men are more likely to be tired, shaky, and weak and have muscle aches and abdominal pain. A fever may mean the infection is in your kidneys. Other symptoms of a kidney infection include pain in your back or sides below the ribs, nausea, and vomiting. °DIAGNOSIS °To diagnose a UTI, your  caregiver will ask you about your symptoms. Your caregiver also will ask to provide a urine sample. The urine sample will be tested for bacteria and white blood cells. White blood cells are made by your body to help fight infection. °TREATMENT  °Typically, UTIs can be treated with medication. Because most UTIs are caused by a bacterial infection, they usually can be treated with the use of antibiotics. The choice of antibiotic and length of treatment depend on your symptoms and the type of bacteria causing your infection. °HOME CARE INSTRUCTIONS °· If you were prescribed antibiotics, take them exactly as your caregiver instructs you. Finish the medication even if you feel better after you have only taken some of the medication. °· Drink enough water and fluids to keep your urine clear or pale yellow. °· Avoid caffeine, tea, and carbonated beverages. They tend to irritate your bladder. °· Empty your bladder often. Avoid holding urine for long periods of time. °· Empty your bladder before and after sexual intercourse. °· After a bowel movement, women should cleanse from front to back. Use each tissue only once. °SEEK MEDICAL CARE IF:  °· You have back pain. °· You develop a fever. °· Your symptoms do not begin to resolve within 3 days. °SEEK IMMEDIATE MEDICAL CARE IF:  °· You have severe back pain or lower abdominal pain. °· You develop chills. °· You have nausea or vomiting. °· You have continued burning or discomfort with urination. °MAKE SURE YOU:  °· Understand these instructions. °· Will watch your condition. °· Will get help right away if you are not doing well or get worse. °Document Released: 08/21/2005 Document Revised: 05/12/2012 Document Reviewed: 12/20/2011 °ExitCare® Patient Information ©2015 [employer]. This information is not intended to replace advice given to you by your health care provider. Make sure you discuss any questions you have with your health care provider. ° °

## 2015-05-27 NOTE — ED Provider Notes (Signed)
CSN: [id number]     Arrival date & time 05/27/15  2026 History   First MD Initiated Contact with Patient 05/27/15 2203     Chief Complaint  Patient presents with  . Hematuria      HPI Patient presents with report of blood in urine since today, intermittent right testicular pain, and falls x 2 on Tuesday (four days ago). Falls resulted in abrasions to nose and left side of face. Past Medical History  Diagnosis Date  . Coronary artery disease     a. s/p MI/CABG 2005. b. s/p cath at time of VF arrest 10/2013 - grafts patent.  [name] GERD (gastroesophageal reflux disease)   . Hyperlipidemia   . Hypertension   . BPH (benign prostatic hypertrophy)     elevated PSA Dr. Lindell Noe Bx 2010  . RBBB   . Cardiac arrest     a. out-of-hospital arrest 11/24/2012 - EF 40-45%, patent grafts on cath, received [employer] Jude AICD.  [name] Ventricular bigeminy     a. Event monitor 01/2013: NSR with PVCs and occ bigeminy.  . Elevated LFTs     a. 10/2012 felt due to cardiac arrest - Hepatitis C Ab reactive from 11/15/2012>>Hep C RNA PCR negative 10/30/2012.   . Carotid disease, bilateral     a. 0-39% by doppers.  [name] AICD (automatic cardioverter/defibrillator) present 2013  . Presence of permanent cardiac pacemaker 2013  . Constipation    Past Surgical History  Procedure Laterality Date  . Cardiac catheterization  2007    with patent graft anatomy atretic left internal mammary  artery to the LAD which is nonobstructive. Will restart study June 08, 2007  . Coronary artery bypass graft  2005  . Lumbar fusion  09/2007  . Cardiac defibrillator placement  dec 2013    Dr. Graciela Husbands, [facility]. Jude  . Appendectomy    . Left heart catheterization with coronary/graft angiogram N/A 11/20/2012    Procedure: LEFT HEART CATHETERIZATION WITH Isabel Caprice;  Surgeon: [name], MD;  Location: [facility] CATH LAB;  Service: Cardiovascular;  Laterality: N/A;  . Implantable cardioverter defibrillator implant N/A 11/19/2012    Procedure:  IMPLANTABLE CARDIOVERTER DEFIBRILLATOR IMPLANT;  Surgeon: Duke Salvia, MD;  Location: [facility] CATH LAB;  Service: Cardiovascular;  Laterality: N/A;  . Back surgery      x 2- then fusion  . Inguinal hernia repair Bilateral 01/17/2015    Procedure: BILATERAL LAPAROSCOPIC INGUINAL HERNIA REPAIR WITH LEFT FEMORAL HERNIA REPAIR;  Surgeon: [name], MD;  Location: [facility] OR;  Service: General;  Laterality: Bilateral;  . Insertion of mesh Bilateral 01/17/2015    Procedure: INSERTION OF MESH;  Surgeon: [name], MD;  Location: [facility] OR;  Service: General;  Laterality: Bilateral;   Family History  Problem Relation Age of Onset  . Coronary artery disease Mother   . Heart attack Brother    History  Substance Use Topics  . Smoking status: Never Smoker   . Smokeless tobacco: Never Used  . Alcohol Use: No    Review of Systems  All other systems reviewed and are negative.     Allergies  Ezetimibe; Niacin; Penicillins; Pravastatin sodium; and Rosuvastatin  Home Medications   Prior to Admission medications   Medication Sig Start Date End Date Taking? Authorizing Provider  alfuzosin (UROXATRAL) 10 MG 24 hr tablet Take 10 mg by mouth every evening.    Yes Historical Provider, MD  aspirin 81 MG chewable tablet Chew 1 tablet (81 mg total) by mouth daily. 11/18/2012  Yes Simonne Martinet, NP  atenolol (TENORMIN) 50 MG tablet Take 0.5 tablets (25 mg total) by mouth daily. Take one tablet by mouth at night Patient taking differently: Take 25 mg by mouth daily.  11/28/14  Yes Aleksei Plotnikov V, MD  atorvastatin (LIPITOR) 20 MG tablet Take 20 mg by mouth every other day.   Yes Historical Provider, MD  Cholecalciferol (VITAMIN D3) 1000 UNITS CAPS Take 1 capsule by mouth daily.     Yes Historical Provider, MD  clopidogrel (PLAVIX) 75 MG tablet Take 1 tablet (75 mg total) by mouth daily. 02/28/15 02/28/16 Yes Aleksei Plotnikov V, MD  diazepam (VALIUM) 5 MG tablet Take 0.5-1 tablets (2.5-5 mg total) by mouth every  12 (twelve) hours as needed for anxiety. 10/31/14  Yes Aleksei Plotnikov V, MD  losartan (COZAAR) 50 MG tablet Take 1 tablet every day. Patient taking differently: Take 25 mg by mouth daily.  01/02/15  Yes Aleksei Plotnikov V, MD  Multiple Vitamin (MULTIVITAMIN WITH MINERALS) TABS Take 1 tablet by mouth daily.   Yes Historical Provider, MD  nitroGLYCERIN (NITROSTAT) 0.4 MG SL tablet Place 1 tablet (0.4 mg total) under the tongue every 5 (five) minutes as needed. For chest pain 10/21/14  Yes Aleksei Plotnikov V, MD  ranitidine (ZANTAC) 150 MG capsule Take 150 mg by mouth as needed for heartburn.    Yes Historical Provider, MD  vitamin B-12 (CYANOCOBALAMIN) 1000 MCG tablet Take 1,000 mcg by mouth daily.   Yes Historical Provider, MD  betamethasone dipropionate (DIPROLENE) 0.05 % cream Apply topically 2 (two) times daily. Patient not taking: Reported on 05/27/2015 01/31/14   Jacinta Shoe V, MD  ciprofloxacin (CIPRO) 500 MG tablet Take 1 tablet (500 mg total) by mouth once. 05/27/15   Nelva Nay, MD  clotrimazole-betamethasone (LOTRISONE) cream Apply 1 application topically 2 (two) times daily. Patient not taking: Reported on 05/27/2015 08/03/14   Tresa Garter, MD  HYDROcodone-acetaminophen (NORCO/VICODIN) 5-325 MG per tablet Take 1 tablet by mouth once. 05/27/15   Nelva Nay, MD  oxyCODONE (OXY IR/ROXICODONE) 5 MG immediate release tablet Take 1-2 tablets (5-10 mg total) by mouth every 4 (four) hours as needed for moderate pain, severe pain or breakthrough pain. Patient not taking: Reported on 02/01/2015 01/17/15   [name], MD  simvastatin (ZOCOR) 20 MG tablet Take 1 tablet (20 mg total) by mouth at bedtime. Patient not taking: Reported on 05/27/2015 07/27/14   Wendall Stade, MD   BP 159/95 mmHg  Pulse 73  Temp(Src) 98.6 F (37 C) (Oral)  Resp 16  Ht 5\' 8"  (1.727 m)  Wt 153 lb 9 oz (69.655 kg)  BMI 23.35 kg/m2  SpO2 96% Physical Exam  Constitutional: He is oriented to person, place, and  time. He appears well-developed and well-nourished. No distress.  HENT:  Head: Normocephalic.    Eyes: Pupils are equal, round, and reactive to light.  Neck: Normal range of motion.  Cardiovascular: Normal rate and intact distal pulses.   Pulmonary/Chest: No respiratory distress.  Abdominal: Normal appearance. He exhibits no distension.  Genitourinary: Penis normal. Right testis shows tenderness. Right testis shows no mass and no swelling. Left testis shows no mass and no swelling.  Musculoskeletal: Normal range of motion.  Neurological: He is alert and oriented to person, place, and time. No cranial nerve deficit.  Skin: Skin is warm and dry. No rash noted.  Psychiatric: He has a normal mood and affect. His behavior is normal.  Nursing note and vitals reviewed.  ED Course  Procedures (including critical care time)  Foley catheter placed with no difficulty.  Urine without clots.  Pink tinged.  Draining appropriately. Labs Review Labs Reviewed  CBC WITH DIFFERENTIAL/PLATELET - Abnormal; Notable for the following:    RBC 4.16 (*)    Hemoglobin 12.6 (*)    HCT 37.4 (*)    Platelets 141 (*)    All other components within normal limits  COMPREHENSIVE METABOLIC PANEL - Abnormal; Notable for the following:    Sodium 133 (*)    Chloride 100 (*)    Glucose, Bld 111 (*)    Calcium 10.4 (*)    GFR calc non Af Amer 59 (*)    All other components within normal limits  URINALYSIS, ROUTINE W REFLEX MICROSCOPIC (NOT AT [facility]) - Abnormal; Notable for the following:    Color, Urine RED (*)    APPearance TURBID (*)    Hgb urine dipstick LARGE (*)    Bilirubin Urine MODERATE (*)    Ketones, ur 15 (*)    Protein, ur 100 (*)    Nitrite POSITIVE (*)    Leukocytes, UA SMALL (*)    All other components within normal limits  URINE CULTURE  PROTIME-INR  URINE MICROSCOPIC-ADD ON    Imaging Review No results found.   EKG Interpretation None     Patient will be started on anti-biotics in  instructed to follow-up with his urologist next week in [place].  Will be sent home with a leg bag instructions. MDM   Final diagnoses:  Hematuria  UTI (lower urinary tract infection)        Nelva Nay, MD 05/27/15 2320

## 2015-05-27 NOTE — ED Notes (Signed)
Patient presents with report of blood in urine since today, intermittent right testicular pain, and falls x 2 on Tuesday (four days ago). Falls resulted in abrasions to nose and left side of face. Denies LOC. Patient is taking Plavix and Aspirin. Reports that he had bilateral hernia surgery in March 2016.

## 2015-05-27 NOTE — ED Notes (Signed)
The pt is c/o bloody urine since this am  He is also having rt testicle pain . He has fallen x 2 this week  Mostly his face and knees.  He is on plavix and aspirin.

## 2015-05-28 DIAGNOSIS — N390 Urinary tract infection, site not specified: Secondary | ICD-10-CM | POA: Diagnosis not present

## 2015-05-28 LAB — URINALYSIS, ROUTINE W REFLEX MICROSCOPIC
Glucose, UA: NEGATIVE mg/dL
Ketones, ur: 15 mg/dL — AB
Nitrite: POSITIVE — AB
Protein, ur: >300 mg/dL — AB
Specific Gravity, Urine: 1.011 (ref 1.005–1.030)
Urobilinogen, UA: 1 mg/dL (ref 0.0–1.0)
pH: 7.5 (ref 5.0–8.0)

## 2015-05-28 LAB — URINE MICROSCOPIC-ADD ON

## 2015-05-28 NOTE — ED Notes (Addendum)
Bladder scan done prior to cath insertion.  263ml per scan  Dr Audie Pinto spoke with urology and need to insert cath due to bleeding with clots with urinary insertion

## 2015-05-28 NOTE — ED Notes (Signed)
Foley changed to leg bag and instructions given to wife regarding changing from one bag to another  Voiced understanding

## 2015-05-29 LAB — URINE CULTURE
Culture: NO GROWTH
Special Requests: NORMAL

## 2015-05-30 DIAGNOSIS — R310 Gross hematuria: Secondary | ICD-10-CM | POA: Diagnosis not present

## 2015-06-09 DIAGNOSIS — R310 Gross hematuria: Secondary | ICD-10-CM | POA: Diagnosis not present

## 2015-06-09 DIAGNOSIS — N401 Enlarged prostate with lower urinary tract symptoms: Secondary | ICD-10-CM | POA: Diagnosis not present

## 2015-06-09 DIAGNOSIS — R972 Elevated prostate specific antigen [PSA]: Secondary | ICD-10-CM | POA: Diagnosis not present

## 2015-06-26 ENCOUNTER — Telehealth: Payer: Self-pay | Admitting: Internal Medicine

## 2015-06-26 ENCOUNTER — Ambulatory Visit (INDEPENDENT_AMBULATORY_CARE_PROVIDER_SITE_OTHER): Payer: Medicare Other | Admitting: *Deleted

## 2015-06-26 DIAGNOSIS — I469 Cardiac arrest, cause unspecified: Secondary | ICD-10-CM

## 2015-06-26 DIAGNOSIS — H0013 Chalazion right eye, unspecified eyelid: Secondary | ICD-10-CM | POA: Diagnosis not present

## 2015-06-26 NOTE — Telephone Encounter (Signed)
LM informing patient that the transmission was received. Encouraged pt to call back w/questions.

## 2015-06-26 NOTE — Telephone Encounter (Signed)
New message ° ° ° ° ° °Need help with remote transmission °

## 2015-06-27 NOTE — Progress Notes (Signed)
Remote ICD transmission.   

## 2015-06-30 LAB — CUP PACEART REMOTE DEVICE CHECK
Battery Remaining Percentage: 76 %
Brady Statistic RV Percent Paced: 1 % — CL
Date Time Interrogation Session: 20160805145337
HighPow Impedance: 70 Ohm
Lead Channel Impedance Value: 380 Ohm
Lead Channel Sensing Intrinsic Amplitude: 3.2 mV
Lead Channel Setting Pacing Amplitude: 2.5 V
Lead Channel Setting Pacing Pulse Width: 0.8 ms
Lead Channel Setting Sensing Sensitivity: 0.5 mV
Pulse Gen Serial Number: 7048849
Zone Setting Detection Interval: 250 ms
Zone Setting Detection Interval: 300 ms

## 2015-07-04 ENCOUNTER — Other Ambulatory Visit: Payer: Self-pay

## 2015-07-04 MED ORDER — ATORVASTATIN CALCIUM 20 MG PO TABS: 20.0000 mg | ORAL_TABLET | ORAL | Status: DC

## 2015-07-12 ENCOUNTER — Encounter: Payer: Self-pay | Admitting: Cardiology

## 2015-07-17 ENCOUNTER — Encounter: Payer: Self-pay | Admitting: Internal Medicine

## 2015-07-17 DIAGNOSIS — R972 Elevated prostate specific antigen [PSA]: Secondary | ICD-10-CM | POA: Diagnosis not present

## 2015-07-28 ENCOUNTER — Telehealth: Payer: Self-pay | Admitting: *Deleted

## 2015-07-28 NOTE — Telephone Encounter (Signed)
Rf req for Diazepam 5 mg. # 30. Last filled 03/23/15. Ok to Rf?

## 2015-07-28 NOTE — Telephone Encounter (Signed)
OK to fill this prescription with additional refills x3 Thank you!  

## 2015-08-01 MED ORDER — DIAZEPAM 5 MG PO TABS: 2.5000 mg | ORAL_TABLET | Freq: Two times a day (BID) | ORAL | Status: DC | PRN

## 2015-08-01 NOTE — Telephone Encounter (Signed)
Done. See meds.  

## 2015-08-09 ENCOUNTER — Ambulatory Visit (INDEPENDENT_AMBULATORY_CARE_PROVIDER_SITE_OTHER): Payer: Medicare Other | Admitting: Internal Medicine

## 2015-08-09 ENCOUNTER — Other Ambulatory Visit (INDEPENDENT_AMBULATORY_CARE_PROVIDER_SITE_OTHER): Payer: Medicare Other

## 2015-08-09 ENCOUNTER — Encounter: Payer: Self-pay | Admitting: Internal Medicine

## 2015-08-09 VITALS — BP 150/80 | HR 67 | Wt 152.0 lb

## 2015-08-09 DIAGNOSIS — I259 Chronic ischemic heart disease, unspecified: Secondary | ICD-10-CM

## 2015-08-09 DIAGNOSIS — Z23 Encounter for immunization: Secondary | ICD-10-CM

## 2015-08-09 DIAGNOSIS — R05 Cough: Secondary | ICD-10-CM | POA: Diagnosis not present

## 2015-08-09 DIAGNOSIS — K5901 Slow transit constipation: Secondary | ICD-10-CM | POA: Diagnosis not present

## 2015-08-09 DIAGNOSIS — R059 Cough, unspecified: Secondary | ICD-10-CM

## 2015-08-09 DIAGNOSIS — M545 Low back pain: Secondary | ICD-10-CM | POA: Diagnosis not present

## 2015-08-09 DIAGNOSIS — K5900 Constipation, unspecified: Secondary | ICD-10-CM | POA: Insufficient documentation

## 2015-08-09 DIAGNOSIS — I2510 Atherosclerotic heart disease of native coronary artery without angina pectoris: Secondary | ICD-10-CM

## 2015-08-09 LAB — CK: Total CK: 130 U/L (ref 7–232)

## 2015-08-09 LAB — CBC WITH DIFFERENTIAL/PLATELET
Basophils Absolute: 0 10*3/uL (ref 0.0–0.1)
Basophils Relative: 0.5 % (ref 0.0–3.0)
Eosinophils Absolute: 0.1 10*3/uL (ref 0.0–0.7)
Eosinophils Relative: 2 % (ref 0.0–5.0)
HCT: 39.2 % (ref 39.0–52.0)
Hemoglobin: 13.2 g/dL (ref 13.0–17.0)
Lymphocytes Relative: 26.5 % (ref 12.0–46.0)
Lymphs Abs: 1.6 10*3/uL (ref 0.7–4.0)
MCHC: 33.6 g/dL (ref 30.0–36.0)
MCV: 90.2 fl (ref 78.0–100.0)
Monocytes Absolute: 0.4 10*3/uL (ref 0.1–1.0)
Monocytes Relative: 7.1 % (ref 3.0–12.0)
Neutro Abs: 3.9 10*3/uL (ref 1.4–7.7)
Neutrophils Relative %: 63.9 % (ref 43.0–77.0)
Platelets: 120 10*3/uL — ABNORMAL LOW (ref 150.0–400.0)
RBC: 4.34 Mil/uL (ref 4.22–5.81)
RDW: 14.2 % (ref 11.5–15.5)
WBC: 6.2 10*3/uL (ref 4.0–10.5)

## 2015-08-09 LAB — TSH: TSH: 1.55 u[IU]/mL (ref 0.35–4.50)

## 2015-08-09 LAB — BASIC METABOLIC PANEL
BUN: 14 mg/dL (ref 6–23)
CO2: 26 mEq/L (ref 19–32)
Calcium: 10.7 mg/dL — ABNORMAL HIGH (ref 8.4–10.5)
Chloride: 102 mEq/L (ref 96–112)
Creatinine, Ser: 1.08 mg/dL (ref 0.40–1.50)
GFR: 69.78 mL/min (ref >60.00)
Glucose, Bld: 93 mg/dL (ref 70–99)
Potassium: 5 mEq/L (ref 3.5–5.1)
Sodium: 135 mEq/L (ref 135–145)

## 2015-08-09 MED ORDER — COENZYME Q10 30 MG PO CAPS: 50.0000 mg | ORAL_CAPSULE | Freq: Two times a day (BID) | ORAL | Status: DC

## 2015-08-09 MED ORDER — LINACLOTIDE 145 MCG PO CAPS: 145.0000 ug | ORAL_CAPSULE | ORAL | Status: DC

## 2015-08-09 NOTE — Progress Notes (Signed)
Subjective:  Patient ID: [name] Christensen, male    DOB: 1932/04/15  Age: 81 y.o. MRN: [id number]  CC: No chief complaint on file.   HPI SENDER STARR presents for CAD, dyslipidemia, LBP.  LBP is related to statins. C/o constipation after hernia surgery. Abd CT was ok   Outpatient Prescriptions Prior to Visit  Medication Sig Dispense Refill  . alfuzosin (UROXATRAL) 10 MG 24 hr tablet Take 10 mg by mouth every evening.     [name] aspirin 81 MG chewable tablet Chew 1 tablet (81 mg total) by mouth daily.    [name] atenolol (TENORMIN) 50 MG tablet Take 0.5 tablets (25 mg total) by mouth daily. Take one tablet by mouth at night (Patient taking differently: Take 25 mg by mouth daily. ) 30 tablet 5  . atorvastatin (LIPITOR) 20 MG tablet Take 1 tablet (20 mg total) by mouth every other day. 90 tablet 1  . betamethasone dipropionate (DIPROLENE) 0.05 % cream Apply topically 2 (two) times daily. 30 g 3  . Cholecalciferol (VITAMIN D3) 1000 UNITS CAPS Take 1 capsule by mouth daily.      . clopidogrel (PLAVIX) 75 MG tablet Take 1 tablet (75 mg total) by mouth daily. 30 tablet 5  . clotrimazole-betamethasone (LOTRISONE) cream Apply 1 application topically 2 (two) times daily. 45 g 3  . diazepam (VALIUM) 5 MG tablet Take 0.5-1 tablets (2.5-5 mg total) by mouth every 12 (twelve) hours as needed for anxiety. 30 tablet 3  . losartan (COZAAR) 50 MG tablet Take 1 tablet every day. (Patient taking differently: Take 25 mg by mouth daily. ) 30 tablet 5  . Multiple Vitamin (MULTIVITAMIN WITH MINERALS) TABS Take 1 tablet by mouth daily.    . nitroGLYCERIN (NITROSTAT) 0.4 MG SL tablet Place 1 tablet (0.4 mg total) under the tongue every 5 (five) minutes as needed. For chest pain 25 tablet 3  . ranitidine (ZANTAC) 150 MG capsule Take 150 mg by mouth as needed for heartburn.     . vitamin B-12 (CYANOCOBALAMIN) 1000 MCG tablet Take 1,000 mcg by mouth daily.    . ciprofloxacin (CIPRO) 500 MG tablet Take 1 tablet (500 mg total) by  mouth once. 20 tablet 0  . HYDROcodone-acetaminophen (NORCO/VICODIN) 5-325 MG per tablet Take 1 tablet by mouth once. (Patient not taking: Reported on 08/09/2015) 10 tablet 0  . oxyCODONE (OXY IR/ROXICODONE) 5 MG immediate release tablet Take 1-2 tablets (5-10 mg total) by mouth every 4 (four) hours as needed for moderate pain, severe pain or breakthrough pain. (Patient not taking: Reported on 08/09/2015) 40 tablet 0  . simvastatin (ZOCOR) 20 MG tablet Take 1 tablet (20 mg total) by mouth at bedtime. (Patient not taking: Reported on 08/09/2015) 90 tablet 3   No facility-administered medications prior to visit.    ROS Review of Systems  Constitutional: Negative for appetite change, fatigue and unexpected weight change.  HENT: Negative for congestion, nosebleeds, sneezing, sore throat and trouble swallowing.   Eyes: Negative for itching and visual disturbance.  Respiratory: Negative for cough.   Cardiovascular: Negative for chest pain, palpitations and leg swelling.  Gastrointestinal: Positive for constipation. Negative for nausea, diarrhea, blood in stool and abdominal distention.  Genitourinary: Negative for frequency and hematuria.  Musculoskeletal: Positive for myalgias and back pain. Negative for joint swelling, gait problem and neck pain.  Skin: Negative for rash.  Neurological: Negative for dizziness, tremors, speech difficulty and weakness.  Psychiatric/Behavioral: Negative for sleep disturbance, dysphoric mood and agitation. The patient is  not nervous/anxious.     Objective:  BP 150/80 mmHg  Pulse 67  Wt 152 lb (68.947 kg)  SpO2 97%  BP Readings from Last 3 Encounters:  08/09/15 150/80  05/27/15 159/95  02/01/15 120/70    Wt Readings from Last 3 Encounters:  08/09/15 152 lb (68.947 kg)  05/27/15 153 lb 9 oz (69.655 kg)  02/01/15 159 lb 4 oz (72.235 kg)    Physical Exam  Constitutional: He is oriented to person, place, and time. He appears well-developed. No distress.    NAD  HENT:  Mouth/Throat: Oropharynx is clear and moist.  Eyes: Conjunctivae are normal. Pupils are equal, round, and reactive to light.  Neck: Normal range of motion. No JVD present. No thyromegaly present.  Cardiovascular: Normal rate, regular rhythm, normal heart sounds and intact distal pulses.  Exam reveals no gallop and no friction rub.   No murmur heard. Pulmonary/Chest: Effort normal and breath sounds normal. No respiratory distress. He has no wheezes. He has no rales. He exhibits no tenderness.  Abdominal: Soft. Bowel sounds are normal. He exhibits no distension and no mass. There is no tenderness. There is no rebound and no guarding.  Musculoskeletal: Normal range of motion. He exhibits no edema or tenderness.  Lymphadenopathy:    He has no cervical adenopathy.  Neurological: He is alert and oriented to person, place, and time. He has normal reflexes. No cranial nerve deficit. He exhibits normal muscle tone. He displays a negative Romberg sign. Coordination and gait normal.  Skin: Skin is warm and dry. No rash noted.  Psychiatric: He has a normal mood and affect. His behavior is normal. Judgment and thought content normal.    Lab Results  Component Value Date   WBC 6.2 08/09/2015   HGB 13.2 08/09/2015   HCT 39.2 08/09/2015   PLT 120.0* 08/09/2015   GLUCOSE 93 08/09/2015   CHOL 161 02/02/2015   TRIG 74.0 02/02/2015   HDL 46.30 02/02/2015   LDLCALC 100* 02/02/2015   ALT 31 05/27/2015   AST 40 05/27/2015   NA 135 08/09/2015   K 5.0 08/09/2015   CL 102 08/09/2015   CREATININE 1.08 08/09/2015   BUN 14 08/09/2015   CO2 26 08/09/2015   TSH 1.55 08/09/2015   PSA 10.39* 08/03/2014   INR 1.18 05/27/2015   HGBA1C 6.3* 11/20/2012    No results found.  Assessment & Plan:   Diagnoses and all orders for this visit:  Atherosclerosis of native coronary artery of native heart without angina pectoris -     CBC with Differential/Platelet; Future -     Basic metabolic panel;  Future -     CK; Future -     TSH; Future  Low back pain without sciatica, unspecified back pain laterality -     CBC with Differential/Platelet; Future -     Basic metabolic panel; Future -     CK; Future -     TSH; Future  Cough -     CBC with Differential/Platelet; Future -     Basic metabolic panel; Future -     CK; Future -     TSH; Future  Slow transit constipation -     CBC with Differential/Platelet; Future -     Basic metabolic panel; Future -     CK; Future -     TSH; Future  Need for influenza vaccination -     Flu Vaccine QUAD 36+ mos IM  Other orders -  co-enzyme Q-10 30 MG capsule; Take 2 capsules (60 mg total) by mouth 2 (two) times daily. -     Linaclotide (LINZESS) 145 MCG CAPS capsule; Take 1 capsule (145 mcg total) by mouth 1 day or 1 dose.   I have discontinued Mr. Cipriano simvastatin, oxyCODONE, ciprofloxacin, and HYDROcodone-acetaminophen. I am also having him start on co-enzyme Q-10 and Linaclotide. Additionally, I am having him maintain his Vitamin D3, aspirin, alfuzosin, multivitamin with minerals, ranitidine, betamethasone dipropionate, clotrimazole-betamethasone, nitroGLYCERIN, atenolol, losartan, vitamin B-12, clopidogrel, atorvastatin, and diazepam.  Meds ordered this encounter  Medications  . co-enzyme Q-10 30 MG capsule    Sig: Take 2 capsules (60 mg total) by mouth 2 (two) times daily.    Dispense:  200 capsule    Refill:  3  . Linaclotide (LINZESS) 145 MCG CAPS capsule    Sig: Take 1 capsule (145 mcg total) by mouth 1 day or 1 dose.    Dispense:  30 capsule    Refill:  11     Follow-up: Return in about 6 months (around 02/06/2016) for Wellness Exam.  [name], MD

## 2015-08-09 NOTE — Assessment & Plan Note (Signed)
Started after hernia surgery 2016 CT abd 2016 ok Try Linzess

## 2015-08-09 NOTE — Assessment & Plan Note (Addendum)
Recurrent CXR 1/16

## 2015-08-09 NOTE — Assessment & Plan Note (Signed)
Coronary bypass grafting 2005 Cardiac catheterization 2007 with patent graft anatomy but atretic left internal mammary artery to LAD which is nonobstructive. Sudden death 2013 2014 no angina Dr Johnsie Cancel

## 2015-08-09 NOTE — Progress Notes (Signed)
Pre visit review using our clinic review tool, if applicable. No additional management support is needed unless otherwise documented below in the visit note. 

## 2015-08-09 NOTE — Assessment & Plan Note (Addendum)
Worse - statin related CoQ10 to try Hold Lipitor

## 2015-08-25 ENCOUNTER — Encounter: Payer: Self-pay | Admitting: Internal Medicine

## 2015-08-30 ENCOUNTER — Other Ambulatory Visit: Payer: Self-pay

## 2015-08-30 MED ORDER — CLOTRIMAZOLE-BETAMETHASONE 1-0.05 % EX CREA
1.0000 | TOPICAL_CREAM | Freq: Two times a day (BID) | CUTANEOUS | Status: DC
Start: 2015-08-30 — End: 2017-06-06

## 2015-09-05 ENCOUNTER — Other Ambulatory Visit: Payer: Self-pay | Admitting: *Deleted

## 2015-09-05 MED ORDER — CLOPIDOGREL BISULFATE 75 MG PO TABS: 75.0000 mg | ORAL_TABLET | Freq: Every day | ORAL | Status: DC

## 2015-09-08 ENCOUNTER — Encounter: Payer: Self-pay | Admitting: *Deleted

## 2015-09-27 ENCOUNTER — Ambulatory Visit (INDEPENDENT_AMBULATORY_CARE_PROVIDER_SITE_OTHER): Payer: Medicare Other | Admitting: *Deleted

## 2015-09-27 DIAGNOSIS — I469 Cardiac arrest, cause unspecified: Secondary | ICD-10-CM

## 2015-09-27 LAB — CUP PACEART REMOTE DEVICE CHECK
Battery Remaining Longevity: 72 mo
Brady Statistic RV Percent Paced: 1 % — CL
Date Time Interrogation Session: 20161117171500
Implantable Lead Implant Date: 20131218
Implantable Lead Location: 753860
Lead Channel Setting Pacing Amplitude: 2.5 V
Lead Channel Setting Pacing Pulse Width: 0.8 ms
Lead Channel Setting Sensing Sensitivity: 0.5 mV
Pulse Gen Serial Number: 7048849

## 2015-09-28 NOTE — Progress Notes (Signed)
Remote ICD transmission.   

## 2015-10-26 ENCOUNTER — Encounter: Payer: Self-pay | Admitting: *Deleted

## 2015-10-26 ENCOUNTER — Ambulatory Visit: Payer: Medicare Other | Admitting: Cardiovascular Disease

## 2015-10-26 NOTE — Progress Notes (Signed)
Patient ID: [name] Christensen, male   DOB: 1932-11-21, 81 y.o.   MRN: [id number] [name] Christensen is a 81 y.o.  male   Seen following discharge from the hospital in May because of the admission for chest pain and bradycardia for which he was found to have bigeminy. He had noted that he had had fatigue for the antecedent 6-8 weeks.  Dr. Crissie Sickles change his atenolol to carvedilol which has been associated with dizziness and headache.  He has a history of ischemic heart disease with prior bypass surgery. His cardiac enzymes were negative. He is status post ICD implantation-single chamber undertaken for aborted cardiac arrest.   Catheterization 10/2012 demonstrating patent grafts with an atretic LIMA; LV function was normal  No angina or AICD d/c  Still working at [employer] 12/27/2013 Study Conclusions  - Left ventricle: The cavity size was normal. There was moderate focal basal and mild concentric hypertrophy of the septum. Systolic function was mildly reduced. The estimated ejection fraction was in the range of 45% to 50%. Inferior hypokinesis. Doppler parameters are consistent with abnormal left ventricular relaxation (grade 1 diastolic dysfunction). The E/e' ratio is >10, suggestingelevated LV filling pressure. - Aortic valve: Moderately calcified leaflets. There was no stenosis. No regurgitation. - Right ventricle: The cavity size was mildly dilated. AICDwire noted in right ventricle. Low normal systolic function. - Right atrium: The atrium was mildly dilated (20 cm2). - Tricuspid valve: Mild regurgitation. - Pulmonary arteries: PA peak pressure: 20mm Hg (S). - Inferior vena cava: The vessel was normal in size; the respirophasic diameter changes were in the normal range (= 50%); findings are consistent with normal central venous pressure. - Pericardium, extracardiac: There was no pericardial effusion.  Had hernia surgery since I last saw him   ROS:  Denies fever, malais, weight loss, blurry vision, decreased visual acuity, cough, sputum, SOB, hemoptysis, pleuritic pain, palpitaitons, heartburn, abdominal pain, melena, lower extremity edema, claudication, or rash.  All other systems reviewed and negative  General: Affect appropriate Healthy:  appears stated age HEENT: normal Neck supple with no adenopathy JVP normal no bruits no thyromegaly Lungs clear with no wheezing and good diaphragmatic motion Heart:  S1/S2 no murmur, no rub, gallop or click  AICD under left clavicle  PMI normal Abdomen: benighn, BS positve, no tenderness, no AAA no bruit.  No HSM or HJR Distal pulses intact with no bruits No edema Neuro non-focal Skin warm and dry No muscular weakness   Current Outpatient Prescriptions  Medication Sig Dispense Refill  . alfuzosin (UROXATRAL) 10 MG 24 hr tablet Take 10 mg by mouth every evening.     [name] aspirin 81 MG chewable tablet Chew 1 tablet (81 mg total) by mouth daily.    [name] atenolol (TENORMIN) 50 MG tablet Take 50 mg by mouth daily.    [name] atorvastatin (LIPITOR) 20 MG tablet Take 1 tablet (20 mg total) by mouth every other day. 90 tablet 1  . betamethasone dipropionate (DIPROLENE) 0.05 % cream Apply topically 2 (two) times daily. 30 g 3  . Cholecalciferol (VITAMIN D3) 1000 UNITS CAPS Take 1 capsule by mouth daily.      . clopidogrel (PLAVIX) 75 MG tablet Take 1 tablet (75 mg total) by mouth daily. 30 tablet 11  . clotrimazole-betamethasone (LOTRISONE) cream Apply 1 application topically 2 (two) times daily. 45 g 3  . co-enzyme Q-10 30 MG capsule Take 2 capsules (60 mg total) by mouth 2 (two) times daily. 200 capsule 3  .  diazepam (VALIUM) 5 MG tablet Take 0.5-1 tablets (2.5-5 mg total) by mouth every 12 (twelve) hours as needed for anxiety. 30 tablet 3  . Linaclotide (LINZESS) 145 MCG CAPS capsule Take 1 capsule (145 mcg total) by mouth 1 day or 1 dose. 30 capsule 11  . losartan (COZAAR) 50 MG tablet Take 25 mg by mouth  daily.    . Multiple Vitamin (MULTIVITAMIN WITH MINERALS) TABS Take 1 tablet by mouth daily.    . nitroGLYCERIN (NITROSTAT) 0.4 MG SL tablet Place 0.4 mg under the tongue every 5 (five) minutes as needed for chest pain (3 doses max).    . ranitidine (ZANTAC) 150 MG capsule Take 150 mg by mouth as needed for heartburn.     . vitamin B-12 (CYANOCOBALAMIN) 1000 MCG tablet Take 1,000 mcg by mouth daily.     No current facility-administered medications for this visit.    Allergies  Ezetimibe; Niacin; Penicillins; Pravastatin sodium; and Rosuvastatin  Electrocardiogram:   05/18/15  SR RBBB PVC  10/30/15  SR rate 68 PR 256 PVC RBBB  No change   Assessment and Plan CAD:  Distant CABG no angina continue medical Rx AICD:  Normal f/unction f/u Dr Graciela Husbands HTN:  Well controlled.  Continue current medications and low sodium Dash type diet.   Chol:   Lab Results  Component Value Date   LDLCALC 100* 02/02/2015  PVC:  Asymptomatic continue beta blocker    Charlton Haws

## 2015-10-30 ENCOUNTER — Encounter: Payer: Self-pay | Admitting: Cardiovascular Disease

## 2015-10-30 ENCOUNTER — Ambulatory Visit (INDEPENDENT_AMBULATORY_CARE_PROVIDER_SITE_OTHER): Payer: Medicare Other | Admitting: Cardiovascular Disease

## 2015-10-30 VITALS — BP 142/70 | HR 68 | Ht 68.0 in | Wt 156.4 lb

## 2015-10-30 DIAGNOSIS — I429 Cardiomyopathy, unspecified: Secondary | ICD-10-CM

## 2015-10-30 DIAGNOSIS — I259 Chronic ischemic heart disease, unspecified: Secondary | ICD-10-CM

## 2015-10-30 NOTE — Patient Instructions (Addendum)
Medication Instructions:  Your physician recommends that you continue on your current medications as directed. Please refer to the Current Medication list given to you today.  Labwork: NONE  Testing/Procedures: NONE  Follow-Up: Your physician wants you to follow-up in: 61 month with Dr. Johnsie Cancel. You will receive a reminder letter in the mail two months in advance. If you don't receive a letter, please call our office to schedule the follow-up appointment.  Your physician recommends that you schedule a follow-up appointment in: 2 months with Dr. [name] for EP clinic follow-up.    If you need a refill on your cardiac medications before your next appointment, please call your pharmacy.

## 2015-11-24 ENCOUNTER — Telehealth: Payer: Self-pay

## 2015-11-24 ENCOUNTER — Other Ambulatory Visit: Payer: Self-pay | Admitting: *Deleted

## 2015-11-24 MED ORDER — ATENOLOL 50 MG PO TABS
50.0000 mg | ORAL_TABLET | Freq: Every day | ORAL | Status: DC
Start: 2015-11-24 — End: 2016-11-27

## 2015-11-24 NOTE — Telephone Encounter (Signed)
Pharmacy wanted to verify the amount and dosage of the following: Losartan 50 mg and atenolol 50 mg have sig discrepency.    [place] at pharmacy  Both Cardiologist and PCP are sent the message for verification. Please advise

## 2015-11-25 NOTE — Telephone Encounter (Signed)
The pt is stating otherwise according to the pharmacy.

## 2015-11-25 NOTE — Telephone Encounter (Signed)
Pls check w/pt Thx

## 2015-11-25 NOTE — Telephone Encounter (Signed)
Note indicates losartin is 25 mg daily and atenolol is 50 mg

## 2015-11-28 ENCOUNTER — Telehealth: Payer: Self-pay | Admitting: Internal Medicine

## 2015-11-28 ENCOUNTER — Telehealth: Payer: Self-pay | Admitting: Cardiovascular Disease

## 2015-11-28 MED ORDER — LOSARTAN POTASSIUM 25 MG PO TABS
25.0000 mg | ORAL_TABLET | Freq: Every day | ORAL | Status: DC
Start: 2015-11-28 — End: 2016-01-15

## 2015-11-28 NOTE — Telephone Encounter (Signed)
Ok Thx 

## 2015-11-28 NOTE — Telephone Encounter (Signed)
Called patient back, see previous phone note.

## 2015-11-28 NOTE — Telephone Encounter (Signed)
Called patient. Informed him that new prescription has been sent to his pharmacy. Patient verbalized understanding.

## 2015-11-28 NOTE — Telephone Encounter (Signed)
Left message for patient to call back. Called pharmacy also, patient needed new refill, so made refill for correct dose Losartan 25 mg tablet daily.

## 2015-11-28 NOTE — Telephone Encounter (Signed)
F/u  Pt returning RN phone call. Please call back and discuss.   

## 2015-11-28 NOTE — Telephone Encounter (Signed)
error 

## 2015-12-14 ENCOUNTER — Encounter: Payer: Self-pay | Admitting: Cardiology

## 2015-12-21 DIAGNOSIS — N401 Enlarged prostate with lower urinary tract symptoms: Secondary | ICD-10-CM | POA: Diagnosis not present

## 2015-12-21 DIAGNOSIS — R972 Elevated prostate specific antigen [PSA]: Secondary | ICD-10-CM | POA: Diagnosis not present

## 2015-12-21 DIAGNOSIS — R310 Gross hematuria: Secondary | ICD-10-CM | POA: Diagnosis not present

## 2016-01-01 ENCOUNTER — Other Ambulatory Visit: Payer: Self-pay | Admitting: *Deleted

## 2016-01-01 MED ORDER — NITROGLYCERIN 0.4 MG SL SUBL: 0.4000 mg | SUBLINGUAL_TABLET | SUBLINGUAL | Status: DC | PRN

## 2016-01-15 ENCOUNTER — Encounter: Payer: Self-pay | Admitting: Internal Medicine

## 2016-01-15 ENCOUNTER — Ambulatory Visit (INDEPENDENT_AMBULATORY_CARE_PROVIDER_SITE_OTHER): Payer: Medicare Other | Admitting: Internal Medicine

## 2016-01-15 VITALS — BP 126/70 | HR 82 | Ht 68.0 in | Wt 156.0 lb

## 2016-01-15 DIAGNOSIS — I493 Ventricular premature depolarization: Secondary | ICD-10-CM

## 2016-01-15 DIAGNOSIS — I519 Heart disease, unspecified: Secondary | ICD-10-CM | POA: Diagnosis not present

## 2016-01-15 DIAGNOSIS — Z95810 Presence of automatic (implantable) cardiac defibrillator: Secondary | ICD-10-CM

## 2016-01-15 DIAGNOSIS — I255 Ischemic cardiomyopathy: Secondary | ICD-10-CM

## 2016-01-15 LAB — CUP PACEART INCLINIC DEVICE CHECK
Battery Remaining Longevity: 73.2
Brady Statistic RV Percent Paced: 0.61 %
Date Time Interrogation Session: 20170220095845
HighPow Impedance: 61.875
Implantable Lead Implant Date: 20131218
Implantable Lead Location: 753860
Lead Channel Impedance Value: 350 Ohm
Lead Channel Pacing Threshold Amplitude: 1 V
Lead Channel Pacing Threshold Amplitude: 1 V
Lead Channel Pacing Threshold Pulse Width: 0.8 ms
Lead Channel Pacing Threshold Pulse Width: 0.8 ms
Lead Channel Sensing Intrinsic Amplitude: 4.5 mV
Lead Channel Setting Pacing Amplitude: 2.5 V
Lead Channel Setting Pacing Pulse Width: 0.8 ms
Lead Channel Setting Sensing Sensitivity: 0.5 mV
Pulse Gen Serial Number: 7048849

## 2016-01-15 NOTE — Progress Notes (Signed)
2015     Patient Care Team: Tresa Garter, MD as PCP - General (Internal Medicine) Wendall Stade, MD as Attending Physician (Cardiology) Dawna Part., MD (Urology)   HPI  [name] Christensen is a 82 y.o. male Seen follow up for ICD implantation-single chamber undertaken for aborted cardiac arrest. Catheterization 12/13 demonstrating patent grafts with an atretic LIMA; LV function was normal S/p CABG  He was seen last month is noted to have significant ectopy with a pattern of bigeminy. Previous echo had demonstrated an ejection fraction of 45% in 2013. Holter demonstrated approximately 13% PVCs:  repeat Holter 1/15 also demonstrated frequent PVCs in the range of 10-15%  Echo 2015 February EF 45-50%  He denies impairment in exercise intolerance and shortness of breath or chest pain.  He notes some recent sensations over his right chest. This is been going on about a week.  He continues to have spells where he gets cold for 10 minutes. These are unassociated with fevers, rigors, diaphoresis.  He is to have hernia surgery .    Past Medical History  Diagnosis Date  . Coronary artery disease     a. s/p MI/CABG 2005. b. s/p cath at time of VF arrest 10/2013 - grafts patent.  [name] Christensen GERD (gastroesophageal reflux disease)   . Hyperlipidemia   . Hypertension   . BPH (benign prostatic hypertrophy)     elevated PSA Dr. Lindell Noe Bx 2010  . RBBB   . Cardiac arrest [facility])     a. out-of-hospital arrest 11/13/2012 - EF 40-45%, patent grafts on cath, received [employer] [name] Christensen AICD.  [name] Christensen Ventricular bigeminy     a. Event monitor 01/2013: NSR with PVCs and occ bigeminy.  . Elevated LFTs     a. 10/2012 felt due to cardiac arrest - Hepatitis C Ab reactive from 11/18/2012>>Hep C RNA PCR negative 11/13/2012.   . Carotid disease, bilateral (HCC)     a. 0-39% by doppers.  [name] Christensen AICD (automatic cardioverter/defibrillator) present 2013  . Presence of permanent cardiac pacemaker 2013  . Constipation      Past Surgical History  Procedure Laterality Date  . Cardiac catheterization  2007    with patent graft anatomy atretic left internal mammary  artery to the LAD which is nonobstructive. Will restart study June 08, 2007  . Coronary artery bypass graft  2005  . Lumbar fusion  09/2007  . Cardiac defibrillator placement  dec 2013    Dr. Graciela Husbands, [facility]. [name] Christensen  . Appendectomy    . Left heart catheterization with coronary/graft angiogram N/A 12-10-2012    Procedure: LEFT HEART CATHETERIZATION WITH Isabel Caprice;  Surgeon: [name], MD;  Location: [facility] CATH LAB;  Service: Cardiovascular;  Laterality: N/A;  . Implantable cardioverter defibrillator implant N/A 10/27/2012    Procedure: IMPLANTABLE CARDIOVERTER DEFIBRILLATOR IMPLANT;  Surgeon: Duke Salvia, MD;  Location[facility] CATH LAB;  Service: Cardiovascular;  Laterality: N/A;  . Back surgery      x 2- then fusion  . Inguinal hernia repair Bilateral 01/17/2015    Procedure: BILATERAL LAPAROSCOPIC INGUINAL HERNIA REPAIR WITH LEFT FEMORAL HERNIA REPAIR;  Surgeon: [name], MD;  Location: [facility] OR;  Service: General;  Laterality: Bilateral;  . Insertion of mesh Bilateral 01/17/2015    Procedure: INSERTION OF MESH;  Surgeon: [name], MD;  Location: [facility] OR;  Service: General;  Laterality: Bilateral;    Current Outpatient Prescriptions  Medication Sig Dispense Refill  . alfuzosin (UROXATRAL) 10 MG 24 hr tablet Take  10 mg by mouth every evening.     [name] Christensen aspirin 81 MG chewable tablet Chew 1 tablet (81 mg total) by mouth daily.    [name] Christensen atenolol (TENORMIN) 50 MG tablet Take 1 tablet (50 mg total) by mouth daily. 30 tablet 11  . atorvastatin (LIPITOR) 20 MG tablet Take 1 tablet (20 mg total) by mouth every other day. 90 tablet 1  . betamethasone dipropionate (DIPROLENE) 0.05 % cream Apply topically 2 (two) times daily. 30 g 3  . Cholecalciferol (VITAMIN D3) 1000 UNITS CAPS Take 1 capsule by mouth daily.      . clopidogrel (PLAVIX) 75 MG tablet  Take 1 tablet (75 mg total) by mouth daily. 30 tablet 11  . clotrimazole-betamethasone (LOTRISONE) cream Apply 1 application topically 2 (two) times daily. 45 g 3  . co-enzyme Q-10 30 MG capsule Take 2 capsules (60 mg total) by mouth 2 (two) times daily. 200 capsule 3  . diazepam (VALIUM) 5 MG tablet Take 0.5-1 tablets (2.5-5 mg total) by mouth every 12 (twelve) hours as needed for anxiety. 30 tablet 3  . Linaclotide (LINZESS) 145 MCG CAPS capsule Take 1 capsule (145 mcg total) by mouth 1 day or 1 dose. 30 capsule 11  . losartan (COZAAR) 25 MG tablet Take 1 tablet (25 mg total) by mouth daily. 90 tablet 3  . Multiple Vitamin (MULTIVITAMIN WITH MINERALS) TABS Take 1 tablet by mouth daily.    . nitroGLYCERIN (NITROSTAT) 0.4 MG SL tablet Place 1 tablet (0.4 mg total) under the tongue every 5 (five) minutes as needed for chest pain (3 doses max). 25 tablet 3  . ranitidine (ZANTAC) 150 MG capsule Take 150 mg by mouth as needed for heartburn.     . vitamin B-12 (CYANOCOBALAMIN) 1000 MCG tablet Take 1,000 mcg by mouth daily.     No current facility-administered medications for this visit.    Allergies  Allergen Reactions  . Ezetimibe     REACTION: numbness  . Niacin     REACTION: CP  . Penicillins Other (See Comments)    Blisters between fingers @ 15  . Pravastatin Sodium Other (See Comments)    Aches and pains in joints  . Rosuvastatin     REACTION: aches    Review of Systems negative except from HPI and PMH  Physical Exam Ht 5\' 8"  (1.727 m) Well developed and well nourished in no acute distress HENT normal E scleral and icterus clear Neck Supple JVP flat; carotids brisk and full Clear to ausculation Regularly irRegular rate and rhythm, no murmurs gallops or rub Soft with active bowel sounds No clubbing cyanosis none Edema Alert and oriented, grossly normal motor and sensory function Skin Warm and Dry  Holter was reviewed and demonstrated 10-15% PVCs with a morphology previously  identified as right bundle branch superior axis  ECG SR 78 28/17/41 RBBB  PVC RBBBSup Axos  Assessment and  Plan  PVCs-right bundle branch superior axis high burden as noted. In the absence of symptoms, intervention would be directed by impact on left ventricular function.  We should recheck echo  Cardiomyopathy ejection fraction 45% 2015 As above   History of aborted sudden cardiac death continue current meds  Implantable defibrillator-St. [name] Christensen The patient's device was interrogated.  The information was reviewed. No changes were made in the programming.    Ischemic cardiomyopathy with prior CABG continue current meds  Without symptoms of ischemia  Vibration sense right chest  Episodic cold spells? Cause    We will  repeat echocardiogram to investigate whether there has been any interval deterioration in LV systolic function.  I'm a little bit bothered by the description of this vibratory sense in his right chest; it sounds like fasciculations. I've asked him to try to visualize it isn't happens again. Hopefully it will resolve.  Modest exercise tolerance remains stable and is without chest pain.  These episodic cold spells continue now over the last year and a half. Blood cultures previously were obtained and were negative. There are not other systemic signs with this. I do not think that manifestation of infection area

## 2016-01-15 NOTE — Patient Instructions (Signed)
Medication Instructions: - Your physician recommends that you continue on your current medications as directed. Please refer to the Current Medication list given to you today.  Labwork: - none  Procedures/Testing: - Your physician has requested that you have an echocardiogram- in [place]. Echocardiography is a painless test that uses sound waves to create images of your heart. It provides your doctor with information about the size and shape of your heart and how well your heart's chambers and valves are working. This procedure takes approximately one hour. There are no restrictions for this procedure.  Follow-Up: - Remote monitoring is used to monitor your Pacemaker of ICD from home. This monitoring reduces the number of office visits required to check your device to one time per year. It allows [place] to keep an eye on the functioning of your device to ensure it is working properly. You are scheduled for a device check from home on 04/15/16. You may send your transmission at any time that day. If you have a wireless device, the transmission will be sent automatically. After your physician reviews your transmission, you will receive a postcard with your next transmission date.  - Your physician wants you to follow-up in: 1 year with Dr. [name]. You will receive a reminder letter in the mail two months in advance. If you don't receive a letter, please call our office to schedule the follow-up appointment.  Any Additional Special Instructions Will Be Listed Below (If Applicable).     If you need a refill on your cardiac medications before your next appointment, please call your pharmacy.

## 2016-01-18 ENCOUNTER — Other Ambulatory Visit: Payer: Self-pay

## 2016-01-18 ENCOUNTER — Ambulatory Visit (INDEPENDENT_AMBULATORY_CARE_PROVIDER_SITE_OTHER): Payer: Medicare Other

## 2016-01-18 DIAGNOSIS — I493 Ventricular premature depolarization: Secondary | ICD-10-CM

## 2016-01-18 DIAGNOSIS — I255 Ischemic cardiomyopathy: Secondary | ICD-10-CM

## 2016-01-19 DIAGNOSIS — R399 Unspecified symptoms and signs involving the genitourinary system: Secondary | ICD-10-CM | POA: Diagnosis not present

## 2016-01-19 DIAGNOSIS — J208 Acute bronchitis due to other specified organisms: Secondary | ICD-10-CM | POA: Diagnosis not present

## 2016-02-06 ENCOUNTER — Ambulatory Visit (INDEPENDENT_AMBULATORY_CARE_PROVIDER_SITE_OTHER): Payer: Medicare Other | Admitting: Internal Medicine

## 2016-02-06 ENCOUNTER — Other Ambulatory Visit (INDEPENDENT_AMBULATORY_CARE_PROVIDER_SITE_OTHER): Payer: Medicare Other

## 2016-02-06 ENCOUNTER — Encounter: Payer: Self-pay | Admitting: Internal Medicine

## 2016-02-06 VITALS — BP 160/80 | HR 61 | Ht 68.0 in | Wt 149.0 lb

## 2016-02-06 DIAGNOSIS — Z0000 Encounter for general adult medical examination without abnormal findings: Secondary | ICD-10-CM | POA: Diagnosis not present

## 2016-02-06 DIAGNOSIS — E785 Hyperlipidemia, unspecified: Secondary | ICD-10-CM | POA: Diagnosis not present

## 2016-02-06 DIAGNOSIS — I255 Ischemic cardiomyopathy: Secondary | ICD-10-CM

## 2016-02-06 DIAGNOSIS — I10 Essential (primary) hypertension: Secondary | ICD-10-CM | POA: Diagnosis not present

## 2016-02-06 DIAGNOSIS — N400 Enlarged prostate without lower urinary tract symptoms: Secondary | ICD-10-CM | POA: Diagnosis not present

## 2016-02-06 DIAGNOSIS — I2510 Atherosclerotic heart disease of native coronary artery without angina pectoris: Secondary | ICD-10-CM

## 2016-02-06 DIAGNOSIS — M545 Low back pain: Secondary | ICD-10-CM

## 2016-02-06 LAB — CBC WITH DIFFERENTIAL/PLATELET
Basophils Absolute: 0 10*3/uL (ref 0.0–0.1)
Basophils Relative: 0.3 % (ref 0.0–3.0)
Eosinophils Absolute: 0.1 10*3/uL (ref 0.0–0.7)
Eosinophils Relative: 1.9 % (ref 0.0–5.0)
HCT: 41 % (ref 39.0–52.0)
Hemoglobin: 13.5 g/dL (ref 13.0–17.0)
Lymphocytes Relative: 28.9 % (ref 12.0–46.0)
Lymphs Abs: 2.1 10*3/uL (ref 0.7–4.0)
MCHC: 33 g/dL (ref 30.0–36.0)
MCV: 90.7 fl (ref 78.0–100.0)
Monocytes Absolute: 0.5 10*3/uL (ref 0.1–1.0)
Monocytes Relative: 6.2 % (ref 3.0–12.0)
Neutro Abs: 4.7 10*3/uL (ref 1.4–7.7)
Neutrophils Relative %: 62.7 % (ref 43.0–77.0)
Platelets: 192 10*3/uL (ref 150.0–400.0)
RBC: 4.52 Mil/uL (ref 4.22–5.81)
RDW: 13.6 % (ref 11.5–15.5)
WBC: 7.4 10*3/uL (ref 4.0–10.5)

## 2016-02-06 LAB — LIPID PANEL
Cholesterol: 124 mg/dL (ref 0–200)
HDL: 54.8 mg/dL (ref >39.00)
LDL Cholesterol: 53 mg/dL (ref 0–99)
NonHDL: 68.95
Total CHOL/HDL Ratio: 2
Triglycerides: 78 mg/dL (ref 0.0–149.0)
VLDL: 15.6 mg/dL (ref 0.0–40.0)

## 2016-02-06 LAB — URINALYSIS
Bilirubin Urine: NEGATIVE
Hgb urine dipstick: NEGATIVE
Ketones, ur: NEGATIVE
Leukocytes, UA: NEGATIVE
Nitrite: NEGATIVE
Specific Gravity, Urine: 1.015 (ref 1.000–1.030)
Total Protein, Urine: NEGATIVE
Urine Glucose: NEGATIVE
Urobilinogen, UA: 0.2 (ref 0.0–1.0)
pH: 6.5 (ref 5.0–8.0)

## 2016-02-06 LAB — BASIC METABOLIC PANEL
BUN: 12 mg/dL (ref 6–23)
CO2: 27 mEq/L (ref 19–32)
Calcium: 10.7 mg/dL — ABNORMAL HIGH (ref 8.4–10.5)
Chloride: 102 mEq/L (ref 96–112)
Creatinine, Ser: 0.94 mg/dL (ref 0.40–1.50)
GFR: 81.8 mL/min (ref >60.00)
Glucose, Bld: 93 mg/dL (ref 70–99)
Potassium: 4.7 mEq/L (ref 3.5–5.1)
Sodium: 137 mEq/L (ref 135–145)

## 2016-02-06 LAB — HEPATIC FUNCTION PANEL
ALT: 14 U/L (ref 0–53)
AST: 23 U/L (ref 0–37)
Albumin: 4.2 g/dL (ref 3.5–5.2)
Alkaline Phosphatase: 93 U/L (ref 39–117)
Bilirubin, Direct: 0.3 mg/dL (ref 0.0–0.3)
Total Bilirubin: 0.9 mg/dL (ref 0.2–1.2)
Total Protein: 7.4 g/dL (ref 6.0–8.3)

## 2016-02-06 LAB — TSH: TSH: 0.92 u[IU]/mL (ref 0.35–4.50)

## 2016-02-06 LAB — PSA: PSA: 12.45 ng/mL — ABNORMAL HIGH (ref 0.10–4.00)

## 2016-02-06 MED ORDER — LOSARTAN POTASSIUM 50 MG PO TABS: 50.0000 mg | ORAL_TABLET | Freq: Every day | ORAL | Status: DC

## 2016-02-06 MED ORDER — ATORVASTATIN CALCIUM 20 MG PO TABS: 20.0000 mg | ORAL_TABLET | Freq: Every day | ORAL | Status: DC

## 2016-02-06 NOTE — Patient Instructions (Signed)

## 2016-02-06 NOTE — Progress Notes (Signed)
Pre visit review using our clinic review tool, if applicable. No additional management support is needed unless otherwise documented below in the visit note. 

## 2016-02-06 NOTE — Progress Notes (Signed)
Subjective:  Patient ID: [name] Christensen, male    DOB: Aug 19, 1932  Age: 82 y.o. MRN: [id number]  CC: No chief complaint on file.   HPI ISREAL HAUBNER presents for HTN, CAD, dyslipidemia f/u. BP was up - pt doubled on Losartan and Atenolol. SBP at home is 130. Well exam  Outpatient Prescriptions Prior to Visit  Medication Sig Dispense Refill  . alfuzosin (UROXATRAL) 10 MG 24 hr tablet Take 10 mg by mouth every evening.     [name] aspirin 81 MG chewable tablet Chew 1 tablet (81 mg total) by mouth daily.    [name] atenolol (TENORMIN) 50 MG tablet Take 1 tablet (50 mg total) by mouth daily. 30 tablet 11  . betamethasone dipropionate (DIPROLENE) 0.05 % cream Apply topically 2 (two) times daily. 30 g 3  . Cholecalciferol (VITAMIN D3) 1000 UNITS CAPS Take 1 capsule by mouth daily.      . clopidogrel (PLAVIX) 75 MG tablet Take 1 tablet (75 mg total) by mouth daily. 30 tablet 11  . clotrimazole-betamethasone (LOTRISONE) cream Apply 1 application topically 2 (two) times daily. 45 g 3  . diazepam (VALIUM) 5 MG tablet Take 0.5-1 tablets (2.5-5 mg total) by mouth every 12 (twelve) hours as needed for anxiety. 30 tablet 3  . Multiple Vitamin (MULTIVITAMIN WITH MINERALS) TABS Take 1 tablet by mouth daily.    . nitroGLYCERIN (NITROSTAT) 0.4 MG SL tablet Place 1 tablet (0.4 mg total) under the tongue every 5 (five) minutes as needed for chest pain (3 doses max). 25 tablet 3  . ranitidine (ZANTAC) 150 MG capsule Take 150 mg by mouth as needed for heartburn.     . vitamin B-12 (CYANOCOBALAMIN) 1000 MCG tablet Take 1,000 mcg by mouth daily.    [name] atorvastatin (LIPITOR) 20 MG tablet Take 1 tablet (20 mg total) by mouth every other day. 90 tablet 1  . losartan (COZAAR) 50 MG tablet Take 25 mg by mouth daily.     No facility-administered medications prior to visit.    ROS Review of Systems  Constitutional: Negative for appetite change, fatigue and unexpected weight change.  HENT: Negative for congestion,  nosebleeds, sneezing, sore throat and trouble swallowing.   Eyes: Negative for itching and visual disturbance.  Respiratory: Negative for cough.   Cardiovascular: Negative for chest pain, palpitations and leg swelling.  Gastrointestinal: Negative for nausea, diarrhea, blood in stool and abdominal distention.  Genitourinary: Negative for frequency and hematuria.  Musculoskeletal: Negative for back pain, joint swelling, gait problem and neck pain.  Skin: Negative for rash.  Neurological: Negative for dizziness, tremors, speech difficulty and weakness.  Psychiatric/Behavioral: Negative for sleep disturbance, dysphoric mood and agitation. The patient is not nervous/anxious.     Objective:  BP 160/80 mmHg  Pulse 61  Ht 5\' 8"  (1.727 m)  Wt 149 lb (67.586 kg)  BMI 22.66 kg/m2  SpO2 97%  BP Readings from Last 3 Encounters:  02/06/16 160/80  01/15/16 126/70  10/30/15 142/70    Wt Readings from Last 3 Encounters:  02/06/16 149 lb (67.586 kg)  01/15/16 156 lb (70.761 kg)  10/30/15 156 lb 6.4 oz (70.943 kg)    Physical Exam  Constitutional: He is oriented to person, place, and time. He appears well-developed. No distress.  NAD  HENT:  Mouth/Throat: Oropharynx is clear and moist.  Eyes: Conjunctivae are normal. Pupils are equal, round, and reactive to light.  Neck: Normal range of motion. No JVD present. No thyromegaly present.  Cardiovascular: Normal rate,  regular rhythm, normal heart sounds and intact distal pulses.  Exam reveals no gallop and no friction rub.   No murmur heard. Pulmonary/Chest: Effort normal and breath sounds normal. No respiratory distress. He has no wheezes. He has no rales. He exhibits no tenderness.  Abdominal: Soft. Bowel sounds are normal. He exhibits no distension and no mass. There is no tenderness. There is no rebound and no guarding.  Musculoskeletal: Normal range of motion. He exhibits no edema or tenderness.  Lymphadenopathy:    He has no cervical  adenopathy.  Neurological: He is alert and oriented to person, place, and time. He has normal reflexes. No cranial nerve deficit. He exhibits normal muscle tone. He displays a negative Romberg sign. Coordination and gait normal.  Skin: Skin is warm and dry. No rash noted.  Psychiatric: He has a normal mood and affect. His behavior is normal. Judgment and thought content normal.   Rectal per urology  Lab Results  Component Value Date   WBC 6.2 08/09/2015   HGB 13.2 08/09/2015   HCT 39.2 08/09/2015   PLT 120.0* 08/09/2015   GLUCOSE 93 08/09/2015   CHOL 161 02/02/2015   TRIG 74.0 02/02/2015   HDL 46.30 02/02/2015   LDLCALC 100* 02/02/2015   ALT 31 05/27/2015   AST 40 05/27/2015   NA 135 08/09/2015   K 5.0 08/09/2015   CL 102 08/09/2015   CREATININE 1.08 08/09/2015   BUN 14 08/09/2015   CO2 26 08/09/2015   TSH 1.55 08/09/2015   PSA 10.39* 08/03/2014   INR 1.18 05/27/2015   HGBA1C 6.3* 11/17/2012    No results found.  Assessment & Plan:   Diagnoses and all orders for this visit:  Well adult exam -     Basic metabolic panel; Future -     Hepatic function panel; Future -     CBC with Differential/Platelet; Future -     Lipid panel; Future -     PSA; Future -     TSH; Future -     Urinalysis; Future  Essential hypertension -     Basic metabolic panel; Future -     Hepatic function panel; Future -     CBC with Differential/Platelet; Future -     Urinalysis; Future  Atherosclerosis of native coronary artery of native heart without angina pectoris -     CBC with Differential/Platelet; Future -     TSH; Future  BPH (benign prostatic hyperplasia) -     PSA; Future  Low back pain without sciatica, unspecified back pain laterality  Dyslipidemia -     Lipid panel; Future -     TSH; Future  Other orders -     atorvastatin (LIPITOR) 20 MG tablet; Take 1 tablet (20 mg total) by mouth daily at 6 PM. -     losartan (COZAAR) 50 MG tablet; Take 1 tablet (50 mg total) by  mouth daily.  I have changed Mr. Southers atorvastatin and losartan. I am also having him maintain his Vitamin D3, aspirin, alfuzosin, multivitamin with minerals, ranitidine, betamethasone dipropionate, vitamin B-12, diazepam, clotrimazole-betamethasone, clopidogrel, atenolol, and nitroGLYCERIN.  Meds ordered this encounter  Medications  . atorvastatin (LIPITOR) 20 MG tablet    Sig: Take 1 tablet (20 mg total) by mouth daily at 6 PM.    Dispense:  90 tablet    Refill:  3  . losartan (COZAAR) 50 MG tablet    Sig: Take 1 tablet (50 mg total) by mouth daily.  Dispense:  90 tablet    Refill:  3     Follow-up: Return in about 4 months (around 06/07/2016) for a follow-up visit.  [name], MD

## 2016-02-06 NOTE — Assessment & Plan Note (Signed)
Atenolol, ASA, Plavix, Lipitor, Losartan Labs 

## 2016-02-06 NOTE — Assessment & Plan Note (Signed)

## 2016-02-06 NOTE — Assessment & Plan Note (Addendum)
Chronic  Losartan, Atenolol Elevated BP - see med adjustment

## 2016-02-06 NOTE — Assessment & Plan Note (Signed)
Seeing a Urologist - Dr Jilda Roche in [place]  PSA was 12

## 2016-02-06 NOTE — Assessment & Plan Note (Signed)
No relapse 

## 2016-03-07 DIAGNOSIS — L812 Freckles: Secondary | ICD-10-CM | POA: Diagnosis not present

## 2016-03-07 DIAGNOSIS — I8393 Asymptomatic varicose veins of bilateral lower extremities: Secondary | ICD-10-CM | POA: Diagnosis not present

## 2016-03-07 DIAGNOSIS — D229 Melanocytic nevi, unspecified: Secondary | ICD-10-CM | POA: Diagnosis not present

## 2016-03-07 DIAGNOSIS — L820 Inflamed seborrheic keratosis: Secondary | ICD-10-CM | POA: Diagnosis not present

## 2016-03-07 DIAGNOSIS — L821 Other seborrheic keratosis: Secondary | ICD-10-CM | POA: Diagnosis not present

## 2016-03-07 DIAGNOSIS — L570 Actinic keratosis: Secondary | ICD-10-CM | POA: Diagnosis not present

## 2016-03-07 DIAGNOSIS — L578 Other skin changes due to chronic exposure to nonionizing radiation: Secondary | ICD-10-CM | POA: Diagnosis not present

## 2016-03-07 DIAGNOSIS — D485 Neoplasm of uncertain behavior of skin: Secondary | ICD-10-CM | POA: Diagnosis not present

## 2016-03-07 DIAGNOSIS — I781 Nevus, non-neoplastic: Secondary | ICD-10-CM | POA: Diagnosis not present

## 2016-03-07 DIAGNOSIS — Z1283 Encounter for screening for malignant neoplasm of skin: Secondary | ICD-10-CM | POA: Diagnosis not present

## 2016-03-07 DIAGNOSIS — D1800 Hemangioma unspecified site: Secondary | ICD-10-CM | POA: Diagnosis not present

## 2016-03-11 DIAGNOSIS — R972 Elevated prostate specific antigen [PSA]: Secondary | ICD-10-CM | POA: Diagnosis not present

## 2016-04-05 ENCOUNTER — Telehealth: Payer: Self-pay | Admitting: Internal Medicine

## 2016-04-05 NOTE — Telephone Encounter (Signed)
I just seen this message after finishing the clinic today. PCP informed and he advises Saturday clinic at [facility], [place] or another location that may have openings today. I called pt and left a detailed mess informing him of PCP's advisement.

## 2016-04-05 NOTE — Telephone Encounter (Signed)
Patient would like to know if [facility] can work him in this morning for exhaustion.  Please advise.

## 2016-04-08 DIAGNOSIS — H2511 Age-related nuclear cataract, right eye: Secondary | ICD-10-CM | POA: Diagnosis not present

## 2016-04-09 ENCOUNTER — Ambulatory Visit (INDEPENDENT_AMBULATORY_CARE_PROVIDER_SITE_OTHER): Payer: Medicare Other | Admitting: Internal Medicine

## 2016-04-09 ENCOUNTER — Ambulatory Visit (INDEPENDENT_AMBULATORY_CARE_PROVIDER_SITE_OTHER)
Admission: RE | Admit: 2016-04-09 | Discharge: 2016-04-09 | Disposition: A | Discharge status: Home / Self Care | Payer: Medicare Other | Source: Ambulatory Visit | Attending: Internal Medicine | Admitting: Internal Medicine

## 2016-04-09 ENCOUNTER — Encounter: Payer: Self-pay | Admitting: Internal Medicine

## 2016-04-09 ENCOUNTER — Other Ambulatory Visit (INDEPENDENT_AMBULATORY_CARE_PROVIDER_SITE_OTHER): Payer: Medicare Other

## 2016-04-09 VITALS — BP 160/68 | HR 58 | Temp 98.2°F | Wt 157.0 lb

## 2016-04-09 DIAGNOSIS — R531 Weakness: Secondary | ICD-10-CM

## 2016-04-09 DIAGNOSIS — R0602 Shortness of breath: Secondary | ICD-10-CM | POA: Diagnosis not present

## 2016-04-09 DIAGNOSIS — I499 Cardiac arrhythmia, unspecified: Secondary | ICD-10-CM

## 2016-04-09 DIAGNOSIS — I255 Ischemic cardiomyopathy: Secondary | ICD-10-CM | POA: Diagnosis not present

## 2016-04-09 DIAGNOSIS — R5383 Other fatigue: Secondary | ICD-10-CM | POA: Insufficient documentation

## 2016-04-09 HISTORY — DX: Weakness: R53.1

## 2016-04-09 LAB — HEPATIC FUNCTION PANEL
ALT: 34 U/L (ref 0–53)
AST: 34 U/L (ref 0–37)
Albumin: 4.2 g/dL (ref 3.5–5.2)
Alkaline Phosphatase: 100 U/L (ref 39–117)
Bilirubin, Direct: 0.2 mg/dL (ref 0.0–0.3)
Total Bilirubin: 0.7 mg/dL (ref 0.2–1.2)
Total Protein: 7.4 g/dL (ref 6.0–8.3)

## 2016-04-09 LAB — BASIC METABOLIC PANEL
BUN: 16 mg/dL (ref 6–23)
CO2: 26 mEq/L (ref 19–32)
Calcium: 10.7 mg/dL — ABNORMAL HIGH (ref 8.4–10.5)
Chloride: 99 mEq/L (ref 96–112)
Creatinine, Ser: 0.99 mg/dL (ref 0.40–1.50)
GFR: 77.02 mL/min (ref >60.00)
Glucose, Bld: 88 mg/dL (ref 70–99)
Potassium: 4.8 mEq/L (ref 3.5–5.1)
Sodium: 131 mEq/L — ABNORMAL LOW (ref 135–145)

## 2016-04-09 LAB — GLUCOSE, POCT (MANUAL RESULT ENTRY): POC Glucose: 89 mg/dl (ref 70–99)

## 2016-04-09 LAB — CBC WITH DIFFERENTIAL/PLATELET
Basophils Absolute: 0 10*3/uL (ref 0.0–0.1)
Basophils Relative: 0.4 % (ref 0.0–3.0)
Eosinophils Absolute: 0.2 10*3/uL (ref 0.0–0.7)
Eosinophils Relative: 2.6 % (ref 0.0–5.0)
HCT: 37.1 % — ABNORMAL LOW (ref 39.0–52.0)
Hemoglobin: 12.6 g/dL — ABNORMAL LOW (ref 13.0–17.0)
Lymphocytes Relative: 33.5 % (ref 12.0–46.0)
Lymphs Abs: 2 10*3/uL (ref 0.7–4.0)
MCHC: 33.9 g/dL (ref 30.0–36.0)
MCV: 89.9 fl (ref 78.0–100.0)
Monocytes Absolute: 0.5 10*3/uL (ref 0.1–1.0)
Monocytes Relative: 8.2 % (ref 3.0–12.0)
Neutro Abs: 3.3 10*3/uL (ref 1.4–7.7)
Neutrophils Relative %: 55.3 % (ref 43.0–77.0)
Platelets: 119 10*3/uL — ABNORMAL LOW (ref 150.0–400.0)
RBC: 4.13 Mil/uL — ABNORMAL LOW (ref 4.22–5.81)
RDW: 14.5 % (ref 11.5–15.5)
WBC: 5.9 10*3/uL (ref 4.0–10.5)

## 2016-04-09 LAB — URINALYSIS
Bilirubin Urine: NEGATIVE
Hgb urine dipstick: NEGATIVE
Ketones, ur: NEGATIVE
Leukocytes, UA: NEGATIVE
Nitrite: NEGATIVE
Specific Gravity, Urine: <=1.005 — AB (ref 1.000–1.030)
Total Protein, Urine: NEGATIVE
Urine Glucose: NEGATIVE
Urobilinogen, UA: 0.2 (ref 0.0–1.0)
pH: 6.5 (ref 5.0–8.0)

## 2016-04-09 LAB — SEDIMENTATION RATE: Sed Rate: 19 mm/hr (ref 0–22)

## 2016-04-09 LAB — TSH: TSH: 2.23 u[IU]/mL (ref 0.35–4.50)

## 2016-04-09 LAB — CK: Total CK: 127 U/L (ref 7–232)

## 2016-04-09 LAB — TROPONIN I: TNIDX: 0 ug/l (ref 0.00–0.06)

## 2016-04-09 MED ORDER — CEFDINIR 300 MG PO CAPS: 300.0000 mg | ORAL_CAPSULE | Freq: Two times a day (BID) | ORAL | Status: DC

## 2016-04-09 NOTE — Progress Notes (Signed)
Subjective:  Patient ID: [name] Christensen, male    DOB: 1932/01/13  Age: 82 y.o. MRN: [id number]  CC: No chief complaint on file.   HPI [name] Christensen presents for fatigue, LE weakness, "swimmy-headedness" x 2 wks. It seems to start after a "cold". C/o HAs  Outpatient Prescriptions Prior to Visit  Medication Sig Dispense Refill  . alfuzosin (UROXATRAL) 10 MG 24 hr tablet Take 10 mg by mouth every evening.     [name] aspirin 81 MG chewable tablet Chew 1 tablet (81 mg total) by mouth daily.    [name] atenolol (TENORMIN) 50 MG tablet Take 1 tablet (50 mg total) by mouth daily. 30 tablet 11  . atorvastatin (LIPITOR) 20 MG tablet Take 1 tablet (20 mg total) by mouth daily at 6 PM. 90 tablet 3  . betamethasone dipropionate (DIPROLENE) 0.05 % cream Apply topically 2 (two) times daily. 30 g 3  . Cholecalciferol (VITAMIN D3) 1000 UNITS CAPS Take 1 capsule by mouth daily.      . clopidogrel (PLAVIX) 75 MG tablet Take 1 tablet (75 mg total) by mouth daily. 30 tablet 11  . clotrimazole-betamethasone (LOTRISONE) cream Apply 1 application topically 2 (two) times daily. 45 g 3  . diazepam (VALIUM) 5 MG tablet Take 0.5-1 tablets (2.5-5 mg total) by mouth every 12 (twelve) hours as needed for anxiety. 30 tablet 3  . losartan (COZAAR) 50 MG tablet Take 1 tablet (50 mg total) by mouth daily. 90 tablet 3  . Multiple Vitamin (MULTIVITAMIN WITH MINERALS) TABS Take 1 tablet by mouth daily.    . nitroGLYCERIN (NITROSTAT) 0.4 MG SL tablet Place 1 tablet (0.4 mg total) under the tongue every 5 (five) minutes as needed for chest pain (3 doses max). 25 tablet 3  . ranitidine (ZANTAC) 150 MG capsule Take 150 mg by mouth as needed for heartburn.     . vitamin B-12 (CYANOCOBALAMIN) 1000 MCG tablet Take 1,000 mcg by mouth daily.     No facility-administered medications prior to visit.    ROS Review of Systems  Constitutional: Positive for fatigue. Negative for fever, appetite change and unexpected weight change.  HENT:  Negative for congestion, nosebleeds, sneezing, sore throat and trouble swallowing.   Eyes: Negative for itching and visual disturbance.  Respiratory: Negative for cough, chest tightness, shortness of breath and wheezing.   Cardiovascular: Negative for chest pain, palpitations and leg swelling.  Gastrointestinal: Negative for nausea, diarrhea, blood in stool and abdominal distention.  Genitourinary: Negative for frequency and hematuria.  Musculoskeletal: Positive for myalgias and arthralgias. Negative for back pain, joint swelling, gait problem and neck pain.  Skin: Negative for rash.  Neurological: Positive for weakness and light-headedness. Negative for dizziness, tremors and speech difficulty.  Psychiatric/Behavioral: Negative for suicidal ideas, sleep disturbance, dysphoric mood, decreased concentration and agitation. The patient is not nervous/anxious.     Objective:  BP 160/68 mmHg  Pulse 58  Temp(Src) 98.2 F (36.8 C) (Oral)  Wt 157 lb (71.215 kg)  SpO2 97%  BP Readings from Last 3 Encounters:  04/09/16 160/68  02/06/16 160/80  01/15/16 126/70    Wt Readings from Last 3 Encounters:  04/09/16 157 lb (71.215 kg)  02/06/16 149 lb (67.586 kg)  01/15/16 156 lb (70.761 kg)    Physical Exam  Constitutional: He is oriented to person, place, and time. He appears well-developed. No distress.  NAD  HENT:  Mouth/Throat: Oropharynx is clear and moist.  Eyes: Conjunctivae are normal. Pupils are equal, round, and reactive  to light.  Neck: Normal range of motion. No JVD present. No thyromegaly present.  Cardiovascular: Intact distal pulses.  Exam reveals no gallop and no friction rub.   No murmur heard. Pulmonary/Chest: Effort normal and breath sounds normal. No respiratory distress. He has no wheezes. He has no rales. He exhibits no tenderness.  Abdominal: Soft. Bowel sounds are normal. He exhibits no distension and no mass. There is no tenderness. There is no rebound and no  guarding.  Musculoskeletal: Normal range of motion. He exhibits no edema or tenderness.  Lymphadenopathy:    He has no cervical adenopathy.  Neurological: He is alert and oriented to person, place, and time. He has normal reflexes. No cranial nerve deficit. He exhibits normal muscle tone. He displays a negative Romberg sign. Coordination and gait normal.  Skin: Skin is warm and dry. No rash noted.  Psychiatric: He has a normal mood and affect. His behavior is normal. Judgment and thought content normal.  irreg beats No ataxia  Procedure: EKG Indication: CAD. Frequent ectopic beats. Impression:  1st degree AVB. No acute changes. Frequent ectopic beats.  Lab Results  Component Value Date   WBC 7.4 02/06/2016   HGB 13.5 02/06/2016   HCT 41.0 02/06/2016   PLT 192.0 02/06/2016   GLUCOSE 93 02/06/2016   CHOL 124 02/06/2016   TRIG 78.0 02/06/2016   HDL 54.80 02/06/2016   LDLCALC 53 02/06/2016   ALT 14 02/06/2016   AST 23 02/06/2016   NA 137 02/06/2016   K 4.7 02/06/2016   CL 102 02/06/2016   CREATININE 0.94 02/06/2016   BUN 12 02/06/2016   CO2 27 02/06/2016   TSH 0.92 02/06/2016   PSA 12.45* 02/06/2016   INR 1.18 05/27/2015   HGBA1C 6.3* 11/16/2012    No results found.  Assessment & Plan:   There are no diagnoses linked to this encounter. I am having Mr. Marcin maintain his Vitamin D3, aspirin, alfuzosin, multivitamin with minerals, ranitidine, betamethasone dipropionate, vitamin B-12, diazepam, clotrimazole-betamethasone, clopidogrel, atenolol, nitroGLYCERIN, atorvastatin, and losartan.  No orders of the defined types were placed in this encounter.     Follow-up: No Follow-up on file.  [name], MD

## 2016-04-09 NOTE — Progress Notes (Signed)
Pre visit review using our clinic review tool, if applicable. No additional management support is needed unless otherwise documented below in the visit note. 

## 2016-04-09 NOTE — Assessment & Plan Note (Signed)
5/17 ?post-viral vs other Labs, EKG, CXR

## 2016-04-16 ENCOUNTER — Telehealth: Payer: Self-pay | Admitting: Cardiology

## 2016-04-16 ENCOUNTER — Ambulatory Visit (INDEPENDENT_AMBULATORY_CARE_PROVIDER_SITE_OTHER): Payer: Medicare Other | Admitting: *Deleted

## 2016-04-16 DIAGNOSIS — I469 Cardiac arrest, cause unspecified: Secondary | ICD-10-CM | POA: Diagnosis not present

## 2016-04-16 DIAGNOSIS — I255 Ischemic cardiomyopathy: Secondary | ICD-10-CM

## 2016-04-16 NOTE — Progress Notes (Signed)
Remote ICD transmission.   

## 2016-04-16 NOTE — Telephone Encounter (Signed)
Spoke with pt and reminded pt of remote transmission that is due today. Pt verbalized understanding.   

## 2016-04-17 DIAGNOSIS — H5211 Myopia, right eye: Secondary | ICD-10-CM | POA: Diagnosis not present

## 2016-04-17 DIAGNOSIS — H2511 Age-related nuclear cataract, right eye: Secondary | ICD-10-CM | POA: Diagnosis not present

## 2016-04-18 ENCOUNTER — Encounter: Payer: Self-pay | Admitting: *Deleted

## 2016-04-23 ENCOUNTER — Ambulatory Visit: Payer: Medicare Other | Admitting: Anesthesiology

## 2016-04-23 ENCOUNTER — Encounter: Payer: Self-pay | Admitting: *Deleted

## 2016-04-23 ENCOUNTER — Ambulatory Visit
Admission: RE | Admit: 2016-04-23 | Discharge: 2016-04-23 | Disposition: A | Discharge status: Home / Self Care | Payer: Medicare Other | Source: Ambulatory Visit | Attending: Ophthalmology | Admitting: Ophthalmology

## 2016-04-23 ENCOUNTER — Encounter
Admission: RE | Disposition: A | Discharge status: Home / Self Care | Payer: Self-pay | Source: Ambulatory Visit | Attending: Ophthalmology

## 2016-04-23 DIAGNOSIS — Z8619 Personal history of other infectious and parasitic diseases: Secondary | ICD-10-CM | POA: Diagnosis not present

## 2016-04-23 DIAGNOSIS — I509 Heart failure, unspecified: Secondary | ICD-10-CM | POA: Diagnosis not present

## 2016-04-23 DIAGNOSIS — N400 Enlarged prostate without lower urinary tract symptoms: Secondary | ICD-10-CM | POA: Diagnosis not present

## 2016-04-23 DIAGNOSIS — H2511 Age-related nuclear cataract, right eye: Secondary | ICD-10-CM | POA: Diagnosis not present

## 2016-04-23 DIAGNOSIS — I739 Peripheral vascular disease, unspecified: Secondary | ICD-10-CM | POA: Insufficient documentation

## 2016-04-23 DIAGNOSIS — E785 Hyperlipidemia, unspecified: Secondary | ICD-10-CM | POA: Insufficient documentation

## 2016-04-23 DIAGNOSIS — I252 Old myocardial infarction: Secondary | ICD-10-CM | POA: Diagnosis not present

## 2016-04-23 DIAGNOSIS — K219 Gastro-esophageal reflux disease without esophagitis: Secondary | ICD-10-CM | POA: Diagnosis not present

## 2016-04-23 DIAGNOSIS — I2510 Atherosclerotic heart disease of native coronary artery without angina pectoris: Secondary | ICD-10-CM | POA: Insufficient documentation

## 2016-04-23 DIAGNOSIS — I10 Essential (primary) hypertension: Secondary | ICD-10-CM | POA: Insufficient documentation

## 2016-04-23 DIAGNOSIS — Z950 Presence of cardiac pacemaker: Secondary | ICD-10-CM | POA: Diagnosis not present

## 2016-04-23 DIAGNOSIS — K5900 Constipation, unspecified: Secondary | ICD-10-CM | POA: Diagnosis not present

## 2016-04-23 DIAGNOSIS — Z951 Presence of aortocoronary bypass graft: Secondary | ICD-10-CM | POA: Insufficient documentation

## 2016-04-23 DIAGNOSIS — Z95810 Presence of automatic (implantable) cardiac defibrillator: Secondary | ICD-10-CM | POA: Insufficient documentation

## 2016-04-23 HISTORY — DX: Heart failure, unspecified: I50.9

## 2016-04-23 HISTORY — PX: CATARACT EXTRACTION W/PHACO: SHX586

## 2016-04-23 SURGERY — PHACOEMULSIFICATION, CATARACT, WITH IOL INSERTION
Anesthesia: Monitor Anesthesia Care | Site: Eye | Laterality: Right | Wound class: Clean

## 2016-04-23 MED ORDER — CARBACHOL 0.01 % IO SOLN
INTRAOCULAR | Status: DC | PRN
  Administered 2016-04-23: 0.5 mL via INTRAOCULAR

## 2016-04-23 MED ORDER — EPINEPHRINE HCL 1 MG/ML IJ SOLN
INTRAOCULAR | Status: DC | PRN
  Administered 2016-04-23: 07:00:00 via OPHTHALMIC

## 2016-04-23 MED ORDER — EPINEPHRINE HCL 1 MG/ML IJ SOLN
INTRAMUSCULAR | Status: AC
  Filled 2016-04-23: qty 1

## 2016-04-23 MED ORDER — SODIUM CHLORIDE 0.9 % IV SOLN
INTRAVENOUS | Status: DC
  Administered 2016-04-23 (×2): via INTRAVENOUS

## 2016-04-23 MED ORDER — FENTANYL CITRATE (PF) 100 MCG/2ML IJ SOLN
INTRAMUSCULAR | Status: DC | PRN
  Administered 2016-04-23: 50 ug via INTRAVENOUS

## 2016-04-23 MED ORDER — MOXIFLOXACIN HCL 0.5 % OP SOLN: 1.0000 [drp] | OPHTHALMIC | Status: DC | PRN

## 2016-04-23 MED ORDER — DEXMEDETOMIDINE HCL 200 MCG/2ML IV SOLN
INTRAVENOUS | Status: DC | PRN
  Administered 2016-04-23: 8 ug via INTRAVENOUS

## 2016-04-23 MED ORDER — TETRACAINE HCL 0.5 % OP SOLN
OPHTHALMIC | Status: AC
  Filled 2016-04-23: qty 2

## 2016-04-23 MED ORDER — ARMC OPHTHALMIC DILATING GEL
1.0000 "application " | OPHTHALMIC | Status: AC | PRN
  Administered 2016-04-23 (×2): 1 via OPHTHALMIC

## 2016-04-23 MED ORDER — POVIDONE-IODINE 5 % OP SOLN
1.0000 "application " | Freq: Once | OPHTHALMIC | Status: AC
  Administered 2016-04-23: 1 via OPHTHALMIC

## 2016-04-23 MED ORDER — TETRACAINE HCL 0.5 % OP SOLN
1.0000 [drp] | Freq: Once | OPHTHALMIC | Status: AC
  Administered 2016-04-23: 1 [drp] via OPHTHALMIC

## 2016-04-23 MED ORDER — MOXIFLOXACIN HCL 0.5 % OP SOLN
OPHTHALMIC | Status: DC | PRN
  Administered 2016-04-23: 1 [drp] via OPHTHALMIC

## 2016-04-23 MED ORDER — MIDAZOLAM HCL 2 MG/2ML IJ SOLN
INTRAMUSCULAR | Status: DC | PRN
  Administered 2016-04-23: 1 mg via INTRAVENOUS

## 2016-04-23 MED ORDER — POVIDONE-IODINE 5 % OP SOLN
OPHTHALMIC | Status: AC
  Filled 2016-04-23: qty 30

## 2016-04-23 MED ORDER — NA CHONDROIT SULF-NA HYALURON 40-17 MG/ML IO SOLN
INTRAOCULAR | Status: AC
  Filled 2016-04-23: qty 1

## 2016-04-23 MED ORDER — MOXIFLOXACIN HCL 0.5 % OP SOLN
OPHTHALMIC | Status: AC
  Filled 2016-04-23: qty 3

## 2016-04-23 MED ORDER — NA CHONDROIT SULF-NA HYALURON 40-17 MG/ML IO SOLN
INTRAOCULAR | Status: DC | PRN
Start: 2016-04-23 — End: 2016-04-23
  Administered 2016-04-23: 1 mL via INTRAOCULAR

## 2016-04-23 SURGICAL SUPPLY — 21 items
CANNULA ANT/CHMB 27GA (MISCELLANEOUS) ×2 IMPLANT
CUP MEDICINE 2OZ PLAST GRAD ST (MISCELLANEOUS) ×2 IMPLANT
GLOVE BIO SURGEON STRL SZ8 (GLOVE) ×2 IMPLANT
GLOVE BIOGEL M 6.5 STRL (GLOVE) ×2 IMPLANT
GLOVE SURG LX 8.0 MICRO (GLOVE) ×1
GLOVE SURG LX STRL 8.0 MICRO (GLOVE) ×1 IMPLANT
GOWN STRL REUS W/ TWL LRG LVL3 (GOWN DISPOSABLE) ×2 IMPLANT
GOWN STRL REUS W/TWL LRG LVL3 (GOWN DISPOSABLE) ×2
LENS IOL TECNIS ITEC 20.5 (Intraocular Lens) ×2 IMPLANT
PACK CATARACT (MISCELLANEOUS) ×2 IMPLANT
PACK CATARACT BRASINGTON LX (MISCELLANEOUS) ×2 IMPLANT
PACK EYE AFTER SURG (MISCELLANEOUS) ×2 IMPLANT
SOL BSS BAG (MISCELLANEOUS) ×2
SOL PREP PVP 2OZ (MISCELLANEOUS) ×2
SOLUTION BSS BAG (MISCELLANEOUS) ×1 IMPLANT
SOLUTION PREP PVP 2OZ (MISCELLANEOUS) ×1 IMPLANT
SYR 3ML LL SCALE MARK (SYRINGE) ×2 IMPLANT
SYR 5ML LL (SYRINGE) ×2 IMPLANT
SYR TB 1ML 27GX1/2 LL (SYRINGE) ×2 IMPLANT
WATER STERILE IRR 1000ML POUR (IV SOLUTION) ×2 IMPLANT
WIPE NON LINTING 3.25X3.25 (MISCELLANEOUS) ×2 IMPLANT

## 2016-04-23 NOTE — Discharge Instructions (Signed)
Eye Surgery Discharge Instructions  Expect mild scratchy sensation or mild soreness. DO NOT RUB YOUR EYE!  The day of surgery:  Minimal physical activity, but bed rest is not required  No reading, computer work, or close hand work  No bending, lifting, or straining.  May watch TV  For 24 hours:  No driving, legal decisions, or alcoholic beverages  Safety precautions  Eat anything you prefer: It is better to start with liquids, then soup then solid foods.  _____ Eye patch should be worn until postoperative exam tomorrow.  ____ Solar shield eyeglasses should be worn for comfort in the sunlight/patch while sleeping  Resume all regular medications including aspirin or Coumadin if these were discontinued prior to surgery. You may shower, bathe, shave, or wash your hair. Tylenol may be taken for mild discomfort.  Call your doctor if you experience significant pain, nausea, or vomiting, fever > 101 or other signs of infection. [phone] or [phone] Specific instructions:  Follow-up Information    Follow up with Tim Lair, MD On 04/24/2016.   Specialty:  Ophthalmology   Why:  10:00   Contact information:   [address] [place] [address] [phone]

## 2016-04-23 NOTE — H&P (Signed)
  All labs reviewed. Abnormal studies sent to patients PCP when indicated.  Previous H&P reviewed, patient examined, there are NO CHANGES.  [name] Keelin LOUIS5/30/20177:18 AM

## 2016-04-23 NOTE — Transfer of Care (Signed)
Immediate Anesthesia Transfer of Care Note  [name]  Procedure(s) Performed: Procedure(s) with comments: CATARACT EXTRACTION PHACO AND INTRAOCULAR LENS PLACEMENT (IOC) (Right) - [place] 1.09 AP% 19.3 CDE 13.38 Fluid pack lot # [id number]  Patient Location: PACU  Anesthesia Type:MAC  Level of Consciousness: awake, alert  and oriented  Airway & Oxygen Therapy: Patient Spontanous Breathing and Patient connected to nasal cannula oxygen  Post-op Assessment: Report given to RN and Post -op Vital signs reviewed and stable  Post vital signs: Reviewed and stable  Last Vitals:  Filed Vitals:   04/23/16 0621  BP: 160/65  Pulse: 55  Temp: 36.4 C  Resp: 16    Last Pain: There were no vitals filed for this visit.       Complications: No apparent anesthesia complications

## 2016-04-23 NOTE — Anesthesia Postprocedure Evaluation (Signed)
Anesthesia Post Note  [name]  Procedure(s) Performed: Procedure(s) (LRB): CATARACT EXTRACTION PHACO AND INTRAOCULAR LENS PLACEMENT (IOC) (Right)  Patient location during evaluation: PACU Anesthesia Type: MAC Level of consciousness: awake and alert Pain management: pain level controlled Vital Signs Assessment: post-procedure vital signs reviewed and stable Respiratory status: spontaneous breathing, nonlabored ventilation, respiratory function stable and patient connected to nasal cannula oxygen Cardiovascular status: stable and blood pressure returned to baseline Anesthetic complications: no    Last Vitals:  Filed Vitals:   04/23/16 0748 04/23/16 0807  BP: 139/54 152/74  Pulse: 50 53  Temp: 36.4 C   Resp: 16 16    Last Pain: There were no vitals filed for this visit.               Precious Haws Emilynn Srinivasan

## 2016-04-23 NOTE — Op Note (Signed)
PREOPERATIVE DIAGNOSIS:  Nuclear sclerotic cataract of the right eye.   POSTOPERATIVE DIAGNOSIS: NUCLEAR SCLEROTIC CATARACT RIGHT EYE    OPERATIVE PROCEDURE:  Procedure(s): CATARACT EXTRACTION PHACO AND INTRAOCULAR LENS PLACEMENT (IOC)   SURGEON:  Birder Robson, MD.   ANESTHESIA:  Anesthesiologist: Andria Frames, MD CRNA: Jenetta Downer, CRNA  1.      Managed anesthesia care. 2.      Topical tetracaine drops followed by 2% Xylocaine jelly applied in the preoperative holding area.   COMPLICATIONS:  None.   TECHNIQUE:   Stop and chop   DESCRIPTION OF PROCEDURE:  The patient was examined and consented in the preoperative holding area where the aforementioned topical anesthesia was applied to the right eye and then brought back to the Operating Room where the right eye was prepped and draped in the usual sterile ophthalmic fashion and a lid speculum was placed. A paracentesis was created with the side port blade and the anterior chamber was filled with viscoelastic. A near clear corneal incision was performed with the steel keratome. A continuous curvilinear capsulorrhexis was performed with a cystotome followed by the capsulorrhexis forceps. Hydrodissection and hydrodelineation were carried out with BSS on a blunt cannula. The lens was removed in a stop and chop  technique and the remaining cortical material was removed with the irrigation-aspiration handpiece. The capsular bag was inflated with viscoelastic and the Technis [id number]  lens was placed in the capsular bag without complication. The remaining viscoelastic was removed from the eye with the irrigation-aspiration handpiece. The wounds were hydrated. The anterior chamber was flushed with Miostat and the eye was inflated to physiologic pressure. 0.1 mL of cefuroxime concentration 10 mg/mL was placed in the anterior chamber. The wounds were found to be water tight. The eye was dressed with Vigamox. The patient was given  protective glasses to wear throughout the day and a shield with which to sleep tonight. The patient was also given drops with which to begin a drop regimen today and will follow-up with me in one day.  Implant Name Type Inv. Item Serial No. Manufacturer Lot No. LRB No. Used  LENS IOL DIOP 20.5 - [id number] Intraocular Lens LENS IOL DIOP 20.5 [id number]   Right 1   Procedure(s) with comments: CATARACT EXTRACTION PHACO AND INTRAOCULAR LENS PLACEMENT (IOC) (Right) - [place] 1.09 AP% 19.3 CDE 13.38 Fluid pack lot # [id number]  Electronically signed: [place] 04/23/2016 7:46 AM

## 2016-04-23 NOTE — Anesthesia Preprocedure Evaluation (Signed)
Anesthesia Evaluation  Patient identified by MRN, date of birth, ID band Patient awake    Reviewed: Allergy & Precautions, H&P , NPO status , Patient's Chart, lab work & pertinent test results  History of Anesthesia Complications Negative for: history of anesthetic complications  Airway Mallampati: III  TM Distance: <3 FB Neck ROM: limited    Dental  (+) Poor Dentition, Chipped   Pulmonary neg pulmonary ROS, neg shortness of breath,    Pulmonary exam normal breath sounds clear to auscultation       Cardiovascular Exercise Tolerance: Good hypertension, (-) angina+ CAD, + Peripheral Vascular Disease and +CHF  Normal cardiovascular exam+ dysrhythmias + pacemaker + Cardiac Defibrillator  Rhythm:regular Rate:Normal     Neuro/Psych  Neuromuscular disease negative psych ROS   GI/Hepatic Neg liver ROS, GERD  ,  Endo/Other  negative endocrine ROS  Renal/GU negative Renal ROS  negative genitourinary   Musculoskeletal   Abdominal   Peds  Hematology negative hematology ROS (+)   Anesthesia Other Findings Past Medical History:   Coronary artery disease                                        Comment:a. s/p MI/CABG 2005. b. s/p cath at time of VF               arrest 10/2013 - grafts patent.   GERD (gastroesophageal reflux disease)                       Hyperlipidemia                                               Hypertension                                                 BPH (benign prostatic hypertrophy)                             Comment:elevated PSA Dr. Lindell Noe Bx 2010   RBBB                                                         Cardiac arrest [facility])                                           Comment:a. out-of-hospital arrest 11/13/2012 - EF               40-45%, patent grafts on cath, received [facility]               [facility] AICD.   Ventricular bigeminy  Comment:a. Event monitor  01/2013: NSR with PVCs and occ               bigeminy.   Elevated LFTs                                                  Comment:a. 10/2012 felt due to cardiac arrest -               Hepatitis C Ab reactive from December 01, 2012>>Hep C               RNA PCR negative 10/30/2012.    Carotid disease, bilateral (HCC)                               Comment:a. 0-39% by doppers.   AICD (automatic cardioverter/defibrillator) pr* 2013         Presence of permanent cardiac pacemaker         2013         Constipation                                                 CHF (congestive heart failure) (HCC)                        Past Surgical History:   CARDIAC CATHETERIZATION                          2007           Comment:with patent graft anatomy atretic left internal              mammary  artery to the LAD which is               nonobstructive. Will restart study June 08, 2007   CORONARY ARTERY BYPASS GRAFT                     2005         LUMBAR FUSION                                    09/2007      CARDIAC DEFIBRILLATOR PLACEMENT                  dec 2013       Comment:Dr. Javier Glazier. Jude   APPENDECTOMY                                                  LEFT HEART CATHETERIZATION WITH CORONARY/GRAFT* N/A 11/20/2012     Comment:Procedure: LEFT HEART CATHETERIZATION WITH               Isabel Caprice;  Surgeon: Peter M               Sw[place]iland, MD;  Location: [facility] CATH LAB;  Service:               Cardiovascular;  Laterality: N/A;   IMPLANTABLE CARDIOVERTER DEFIBRILLATOR IMPLANT  N/A 10/28/2012     Comment:Procedure: IMPLANTABLE CARDIOVERTER               DEFIBRILLATOR IMPLANT;  Surgeon: Duke Salvia, MD;  Location: [facility] CATH LAB;  Service:               Cardiovascular;  Laterality: N/A;   BACK SURGERY                                                    Comment:x 2- then fusion   INGUINAL HERNIA REPAIR                          Bilateral 01/17/2015      Comment:Procedure:  BILATERAL LAPAROSCOPIC INGUINAL               HERNIA REPAIR WITH LEFT FEMORAL HERNIA REPAIR;               Surgeon: [name], MD;  Location: [facility] OR;                Service: General;  Laterality: Bilateral;   INSERTION OF MESH                               Bilateral 01/17/2015      Comment:Procedure: INSERTION OF MESH;  Surgeon: [name], MD;  Location: [facility] OR;  Service: General;              Laterality: Bilateral;   EYE SURGERY                                                   HERNIA REPAIR                                                   Reproductive/Obstetrics negative OB ROS                             Anesthesia Physical Anesthesia Plan  ASA: IV  Anesthesia Plan: MAC   Post-op Pain Management:    Induction:   Airway Management Planned:   Additional Equipment:   Intra-op Plan:   Post-operative Plan:   Informed Consent: I have reviewed the patients History and Physical, chart, labs and discussed the procedure including the risks, benefits and alternatives for the proposed anesthesia with the patient or authorized representative who has indicated his/her understanding and acceptance.   Dental Advisory Given  Plan Discussed with: Anesthesiologist, CRNA and Surgeon  Anesthesia Plan Comments:         Anesthesia Quick Evaluation

## 2016-05-06 LAB — CUP PACEART REMOTE DEVICE CHECK
Brady Statistic RV Percent Paced: 1 %
Date Time Interrogation Session: 20170612135423
HighPow Impedance: 63 Ohm
Implantable Lead Implant Date: 20131218
Implantable Lead Location: 753860
Lead Channel Impedance Value: 350 Ohm
Lead Channel Sensing Intrinsic Amplitude: 7.5 mV
Lead Channel Setting Pacing Amplitude: 2.5 V
Lead Channel Setting Pacing Pulse Width: 0.8 ms
Lead Channel Setting Sensing Sensitivity: 0.5 mV
Pulse Gen Serial Number: 7048849

## 2016-05-14 ENCOUNTER — Encounter: Payer: Self-pay | Admitting: Cardiology

## 2016-06-04 ENCOUNTER — Encounter: Payer: Self-pay | Admitting: Internal Medicine

## 2016-06-04 ENCOUNTER — Ambulatory Visit (INDEPENDENT_AMBULATORY_CARE_PROVIDER_SITE_OTHER): Payer: Medicare Other | Admitting: Internal Medicine

## 2016-06-04 VITALS — BP 140/64 | HR 65 | Wt 150.0 lb

## 2016-06-04 DIAGNOSIS — I2510 Atherosclerotic heart disease of native coronary artery without angina pectoris: Secondary | ICD-10-CM

## 2016-06-04 DIAGNOSIS — G4700 Insomnia, unspecified: Secondary | ICD-10-CM | POA: Diagnosis not present

## 2016-06-04 DIAGNOSIS — Z23 Encounter for immunization: Secondary | ICD-10-CM | POA: Diagnosis not present

## 2016-06-04 DIAGNOSIS — E785 Hyperlipidemia, unspecified: Secondary | ICD-10-CM | POA: Diagnosis not present

## 2016-06-04 DIAGNOSIS — I255 Ischemic cardiomyopathy: Secondary | ICD-10-CM | POA: Diagnosis not present

## 2016-06-04 DIAGNOSIS — I469 Cardiac arrest, cause unspecified: Secondary | ICD-10-CM | POA: Diagnosis not present

## 2016-06-04 NOTE — Assessment & Plan Note (Signed)
Atenolol, ASA, Plavix, Lipitor, Losartan °

## 2016-06-04 NOTE — Progress Notes (Signed)
Subjective:  Patient ID: [name] Christensen, male    DOB: 22-Apr-1932  Age: 82 y.o. MRN: [id number]  CC: No chief complaint on file.   HPI [name] Christensen presents for CAD, dyslipidemia, HTN f/u  Outpatient Prescriptions Prior to Visit  Medication Sig Dispense Refill  . alfuzosin (UROXATRAL) 10 MG 24 hr tablet Take 10 mg by mouth every evening.     [name] aspirin 81 MG chewable tablet Chew 1 tablet (81 mg total) by mouth daily.    [name] atenolol (TENORMIN) 50 MG tablet Take 1 tablet (50 mg total) by mouth daily. 30 tablet 11  . atorvastatin (LIPITOR) 20 MG tablet Take 1 tablet (20 mg total) by mouth daily at 6 PM. 90 tablet 3  . betamethasone dipropionate (DIPROLENE) 0.05 % cream Apply topically 2 (two) times daily. 30 g 3  . Cholecalciferol (VITAMIN D3) 1000 UNITS CAPS Take 1 capsule by mouth daily.      . clopidogrel (PLAVIX) 75 MG tablet Take 1 tablet (75 mg total) by mouth daily. 30 tablet 11  . clotrimazole-betamethasone (LOTRISONE) cream Apply 1 application topically 2 (two) times daily. 45 g 3  . diazepam (VALIUM) 5 MG tablet Take 0.5-1 tablets (2.5-5 mg total) by mouth every 12 (twelve) hours as needed for anxiety. 30 tablet 3  . losartan (COZAAR) 50 MG tablet Take 1 tablet (50 mg total) by mouth daily. 90 tablet 3  . Multiple Vitamin (MULTIVITAMIN WITH MINERALS) TABS Take 1 tablet by mouth daily.    . nitroGLYCERIN (NITROSTAT) 0.4 MG SL tablet Place 1 tablet (0.4 mg total) under the tongue every 5 (five) minutes as needed for chest pain (3 doses max). 25 tablet 3  . ranitidine (ZANTAC) 150 MG capsule Take 150 mg by mouth as needed for heartburn.     . vitamin B-12 (CYANOCOBALAMIN) 1000 MCG tablet Take 1,000 mcg by mouth daily.     No facility-administered medications prior to visit.    ROS Review of Systems  Constitutional: Negative for appetite change, fatigue and unexpected weight change.  HENT: Negative for congestion, nosebleeds, sneezing, sore throat and trouble swallowing.   Eyes:  Negative for itching and visual disturbance.  Respiratory: Negative for cough.   Cardiovascular: Negative for chest pain, palpitations and leg swelling.  Gastrointestinal: Negative for nausea, diarrhea, blood in stool and abdominal distention.  Genitourinary: Negative for urgency, frequency and hematuria.  Musculoskeletal: Negative for back pain, joint swelling, gait problem and neck pain.  Skin: Negative for rash.  Neurological: Negative for dizziness, tremors, speech difficulty and weakness.  Psychiatric/Behavioral: Negative for sleep disturbance, dysphoric mood and agitation. The patient is not nervous/anxious.     Objective:  BP 140/64 mmHg  Pulse 65  Wt 150 lb (68.04 kg)  SpO2 97%  BP Readings from Last 3 Encounters:  06/04/16 140/64  04/23/16 152/74  04/09/16 160/68    Wt Readings from Last 3 Encounters:  06/04/16 150 lb (68.04 kg)  04/09/16 157 lb (71.215 kg)  02/06/16 149 lb (67.586 kg)    Physical Exam  Constitutional: He is oriented to person, place, and time. He appears well-developed. No distress.  NAD  HENT:  Mouth/Throat: Oropharynx is clear and moist.  Eyes: Conjunctivae are normal. Pupils are equal, round, and reactive to light.  Neck: Normal range of motion. No JVD present. No thyromegaly present.  Cardiovascular: Normal rate, regular rhythm, normal heart sounds and intact distal pulses.  Exam reveals no gallop and no friction rub.   No murmur heard.  Pulmonary/Chest: Effort normal and breath sounds normal. No respiratory distress. He has no wheezes. He has no rales. He exhibits no tenderness.  Abdominal: Soft. Bowel sounds are normal. He exhibits no distension and no mass. There is no tenderness. There is no rebound and no guarding.  Musculoskeletal: Normal range of motion. He exhibits no edema or tenderness.  Lymphadenopathy:    He has no cervical adenopathy.  Neurological: He is alert and oriented to person, place, and time. He has normal reflexes. No  cranial nerve deficit. He exhibits normal muscle tone. He displays a negative Romberg sign. Coordination and gait normal.  Skin: Skin is warm and dry. No rash noted.  Psychiatric: He has a normal mood and affect. His behavior is normal. Judgment and thought content normal.    Lab Results  Component Value Date   WBC 5.9 04/09/2016   HGB 12.6* 04/09/2016   HCT 37.1* 04/09/2016   PLT 119.0* 04/09/2016   GLUCOSE 88 04/09/2016   CHOL 124 02/06/2016   TRIG 78.0 02/06/2016   HDL 54.80 02/06/2016   LDLCALC 53 02/06/2016   ALT 34 04/09/2016   AST 34 04/09/2016   NA 131* 04/09/2016   K 4.8 04/09/2016   CL 99 04/09/2016   CREATININE 0.99 04/09/2016   BUN 16 04/09/2016   CO2 26 04/09/2016   TSH 2.23 04/09/2016   PSA 12.45* 02/06/2016   INR 1.18 05/27/2015   HGBA1C 6.3* 10/29/2012    No results found.  Assessment & Plan:   There are no diagnoses linked to this encounter. I am having Mr. Dishner maintain his Vitamin D3, aspirin, alfuzosin, multivitamin with minerals, ranitidine, betamethasone dipropionate, vitamin B-12, diazepam, clotrimazole-betamethasone, clopidogrel, atenolol, nitroGLYCERIN, atorvastatin, and losartan.  No orders of the defined types were placed in this encounter.     Follow-up: No Follow-up on file.  [name], MD

## 2016-06-04 NOTE — Assessment & Plan Note (Signed)
F/u w/Dr Nishan 

## 2016-06-04 NOTE — Patient Instructions (Signed)
Try Valerian root or Tylenol PM at bedtime

## 2016-06-04 NOTE — Assessment & Plan Note (Signed)
On Lipitor 

## 2016-06-04 NOTE — Assessment & Plan Note (Signed)
Valerian root 

## 2016-06-04 NOTE — Addendum Note (Signed)
Addended by: Cresenciano Lick on: 06/04/2016 11:32 AM   Modules accepted: Orders

## 2016-06-04 NOTE — Progress Notes (Signed)
Pre visit review using our clinic review tool, if applicable. No additional management support is needed unless otherwise documented below in the visit note. 

## 2016-06-20 DIAGNOSIS — R972 Elevated prostate specific antigen [PSA]: Secondary | ICD-10-CM | POA: Diagnosis not present

## 2016-06-20 DIAGNOSIS — N401 Enlarged prostate with lower urinary tract symptoms: Secondary | ICD-10-CM | POA: Diagnosis not present

## 2016-07-09 ENCOUNTER — Encounter: Payer: Self-pay | Admitting: Internal Medicine

## 2016-07-10 ENCOUNTER — Other Ambulatory Visit: Payer: Self-pay | Admitting: Internal Medicine

## 2016-07-10 MED ORDER — LINACLOTIDE 290 MCG PO CAPS: 290.0000 ug | ORAL_CAPSULE | Freq: Every day | ORAL | 11 refills | Status: DC

## 2016-07-16 ENCOUNTER — Telehealth: Payer: Self-pay | Admitting: Cardiology

## 2016-07-16 ENCOUNTER — Ambulatory Visit (INDEPENDENT_AMBULATORY_CARE_PROVIDER_SITE_OTHER): Payer: Medicare Other | Admitting: *Deleted

## 2016-07-16 DIAGNOSIS — I469 Cardiac arrest, cause unspecified: Secondary | ICD-10-CM

## 2016-07-16 NOTE — Telephone Encounter (Signed)
Confirmed remote transmission w/ pt wife.   

## 2016-07-17 NOTE — Progress Notes (Signed)
Remote ICD transmission.   

## 2016-07-19 ENCOUNTER — Encounter: Payer: Self-pay | Admitting: Cardiology

## 2016-07-26 LAB — CUP PACEART REMOTE DEVICE CHECK
Battery Remaining Longevity: 66 mo
Battery Remaining Percentage: 69 %
Brady Statistic RV Percent Paced: 1.8 %
Date Time Interrogation Session: 20170901140949
HighPow Impedance: 70 Ohm
Implantable Lead Implant Date: 20131218
Implantable Lead Location: 753860
Lead Channel Impedance Value: 360 Ohm
Lead Channel Sensing Intrinsic Amplitude: 5.8 mV
Lead Channel Setting Pacing Amplitude: 2.5 V
Lead Channel Setting Pacing Pulse Width: 0.8 ms
Lead Channel Setting Sensing Sensitivity: 0.5 mV
Pulse Gen Serial Number: 7048849

## 2016-07-31 ENCOUNTER — Telehealth: Payer: Self-pay | Admitting: *Deleted

## 2016-07-31 NOTE — Telephone Encounter (Signed)
Called patient's home number, patient's wife answered and requested that I call patient on cell number.  LMOVM requesting call back.  Gave device clinic phone number.  Will advise patient that [employer] rep shipped cellular adapter for his Merlin monitor this week.

## 2016-08-01 ENCOUNTER — Telehealth: Payer: Self-pay | Admitting: Internal Medicine

## 2016-08-01 NOTE — Telephone Encounter (Signed)
Patient returned phone call.  Patient received cellular adapter yesterday and hooked it up.  Attempted to assist patient in sending manual transmission, but was unsuccessful.  Gave patient phone number for [employer] for further assistance.  Patient appreciative and denies additional questions or concerns at this time.

## 2016-08-01 NOTE — Telephone Encounter (Signed)
Pls call pt to set-up next available w/Dr. Plotnikov...[name] Christensen

## 2016-08-01 NOTE — Telephone Encounter (Signed)
Patient Name: [name] Christensen  DOB: 05-29-1932    Initial Comment Caller would like to see the Dr today or tomorrow-- has a problem with poss blockage   Nurse Assessment  Nurse: Raphael Gibney, RN, Vanita Ingles Date/Time (Eastern Time): 08/01/2016 8:15:18 AM  Confirm and document reason for call. If symptomatic, describe symptoms. You must click the next button to save text entered. ---Caller states he has problems with constipation. He has using laxatives over the past 3-4 weeks. He can not have a BM without using a laxative. Has a little pain in his right side.  Has the patient traveled out of the country within the last 30 days? ---Not Applicable  Does the patient have any new or worsening symptoms? ---Yes  Will a triage be completed? ---Yes  Related visit to physician within the last 2 weeks? ---No  Does the PT have any chronic conditions? (i.e. diabetes, asthma, etc.) ---Yes  List chronic conditions. ---chronic constipation  Is this a behavioral health or substance abuse call? ---No     Guidelines    Guideline Title Affirmed Question Affirmed Notes  Constipation Unable to have a bowel movement (BM) without laxative or enema    Final Disposition User   See PCP When Office is Open (within 3 days) Raphael Gibney, RN, Vanita Ingles    Comments  No appts available with Dr. Alain Marion today or tomorrow. Pt does not want to see another doctor or go to another office. Please call pt back regarding appt.   Referrals  REFERRED TO PCP OFFICE   Disagree/Comply: Comply

## 2016-08-05 ENCOUNTER — Encounter: Payer: Self-pay | Admitting: Internal Medicine

## 2016-08-05 ENCOUNTER — Ambulatory Visit (INDEPENDENT_AMBULATORY_CARE_PROVIDER_SITE_OTHER): Payer: Medicare Other | Admitting: Internal Medicine

## 2016-08-05 VITALS — BP 130/88 | HR 78 | Temp 97.8°F | Wt 152.0 lb

## 2016-08-05 DIAGNOSIS — I255 Ischemic cardiomyopathy: Secondary | ICD-10-CM

## 2016-08-05 DIAGNOSIS — Z23 Encounter for immunization: Secondary | ICD-10-CM | POA: Diagnosis not present

## 2016-08-05 DIAGNOSIS — K5901 Slow transit constipation: Secondary | ICD-10-CM | POA: Diagnosis not present

## 2016-08-05 DIAGNOSIS — E785 Hyperlipidemia, unspecified: Secondary | ICD-10-CM

## 2016-08-05 DIAGNOSIS — I10 Essential (primary) hypertension: Secondary | ICD-10-CM

## 2016-08-05 NOTE — Assessment & Plan Note (Addendum)
[facility] ref was offered

## 2016-08-05 NOTE — Addendum Note (Signed)
Addended by: Cresenciano Lick on: 08/05/2016 12:10 PM   Modules accepted: Orders

## 2016-08-05 NOTE — Assessment & Plan Note (Signed)
Losartan, Atenolol 

## 2016-08-05 NOTE — Assessment & Plan Note (Signed)
On Lipitor 

## 2016-08-05 NOTE — Progress Notes (Signed)
Pre visit review using our clinic review tool, if applicable. No additional management support is needed unless otherwise documented below in the visit note. 

## 2016-08-05 NOTE — Progress Notes (Signed)
Subjective:  Patient ID: [name] Christensen, male    DOB: 1932/08/05  Age: 82 y.o. MRN: [id number]  CC: No chief complaint on file.   HPI [name] Christensen presents for chronic constipation -Hold Amlodipine for 2 months Take Coreg (Carvedilol) instead F/u CAD, h/o hernia repair     Outpatient Medications Prior to Visit  Medication Sig Dispense Refill  . alfuzosin (UROXATRAL) 10 MG 24 hr tablet Take 10 mg by mouth every evening.     [name] aspirin 81 MG chewable tablet Chew 1 tablet (81 mg total) by mouth daily.    [name] atenolol (TENORMIN) 50 MG tablet Take 1 tablet (50 mg total) by mouth daily. 30 tablet 11  . atorvastatin (LIPITOR) 20 MG tablet Take 1 tablet (20 mg total) by mouth daily at 6 PM. 90 tablet 3  . betamethasone dipropionate (DIPROLENE) 0.05 % cream Apply topically 2 (two) times daily. 30 g 3  . Cholecalciferol (VITAMIN D3) 1000 UNITS CAPS Take 1 capsule by mouth daily.      . clopidogrel (PLAVIX) 75 MG tablet Take 1 tablet (75 mg total) by mouth daily. 30 tablet 11  . clotrimazole-betamethasone (LOTRISONE) cream Apply 1 application topically 2 (two) times daily. 45 g 3  . diazepam (VALIUM) 5 MG tablet Take 0.5-1 tablets (2.5-5 mg total) by mouth every 12 (twelve) hours as needed for anxiety. 30 tablet 3  . linaclotide (LINZESS) 290 MCG CAPS capsule Take 1 capsule (290 mcg total) by mouth daily. 30 capsule 11  . losartan (COZAAR) 50 MG tablet Take 1 tablet (50 mg total) by mouth daily. 90 tablet 3  . Multiple Vitamin (MULTIVITAMIN WITH MINERALS) TABS Take 1 tablet by mouth daily.    . nitroGLYCERIN (NITROSTAT) 0.4 MG SL tablet Place 1 tablet (0.4 mg total) under the tongue every 5 (five) minutes as needed for chest pain (3 doses max). 25 tablet 3  . ranitidine (ZANTAC) 150 MG capsule Take 150 mg by mouth as needed for heartburn.     . vitamin B-12 (CYANOCOBALAMIN) 1000 MCG tablet Take 1,000 mcg by mouth daily.     No facility-administered medications prior to visit.     ROS Review  of Systems  Constitutional: Negative for appetite change, fatigue and unexpected weight change.  HENT: Negative for congestion, nosebleeds, sneezing, sore throat and trouble swallowing.   Eyes: Negative for itching and visual disturbance.  Respiratory: Negative for cough.   Cardiovascular: Negative for chest pain, palpitations and leg swelling.  Gastrointestinal: Positive for constipation. Negative for abdominal distention, blood in stool, diarrhea and nausea.  Genitourinary: Negative for frequency and hematuria.  Musculoskeletal: Negative for back pain, gait problem, joint swelling and neck pain.  Skin: Negative for rash.  Neurological: Negative for dizziness, tremors, speech difficulty and weakness.  Psychiatric/Behavioral: Negative for agitation, dysphoric mood and sleep disturbance. The patient is not nervous/anxious.     Objective:  BP 130/88   Pulse 78   Temp 97.8 F (36.6 C) (Oral)   Wt 152 lb (68.9 kg)   SpO2 97%   BMI 23.11 kg/m   BP Readings from Last 3 Encounters:  08/05/16 130/88  06/04/16 140/64  04/23/16 (!) 152/74    Wt Readings from Last 3 Encounters:  08/05/16 152 lb (68.9 kg)  06/04/16 150 lb (68 kg)  04/09/16 157 lb (71.2 kg)    Physical Exam  Constitutional: He is oriented to person, place, and time. He appears well-developed. No distress.  NAD  HENT:  Mouth/Throat: Oropharynx is  clear and moist.  Eyes: Conjunctivae are normal. Pupils are equal, round, and reactive to light.  Neck: Normal range of motion. No JVD present. No thyromegaly present.  Cardiovascular: Normal rate, regular rhythm, normal heart sounds and intact distal pulses.  Exam reveals no gallop and no friction rub.   No murmur heard. Pulmonary/Chest: Effort normal and breath sounds normal. No respiratory distress. He has no wheezes. He has no rales. He exhibits no tenderness.  Abdominal: Soft. Bowel sounds are normal. He exhibits no distension and no mass. There is no tenderness. There  is no rebound and no guarding.  Musculoskeletal: Normal range of motion. He exhibits no edema or tenderness.  Lymphadenopathy:    He has no cervical adenopathy.  Neurological: He is alert and oriented to person, place, and time. He has normal reflexes. No cranial nerve deficit. He exhibits normal muscle tone. He displays a negative Romberg sign. Coordination and gait normal.  Skin: Skin is warm and dry. No rash noted.  Psychiatric: He has a normal mood and affect. His behavior is normal. Judgment and thought content normal.    Lab Results  Component Value Date   WBC 5.9 04/09/2016   HGB 12.6 (L) 04/09/2016   HCT 37.1 (L) 04/09/2016   PLT 119.0 (L) 04/09/2016   GLUCOSE 88 04/09/2016   CHOL 124 02/06/2016   TRIG 78.0 02/06/2016   HDL 54.80 02/06/2016   LDLCALC 53 02/06/2016   ALT 34 04/09/2016   AST 34 04/09/2016   NA 131 (L) 04/09/2016   K 4.8 04/09/2016   CL 99 04/09/2016   CREATININE 0.99 04/09/2016   BUN 16 04/09/2016   CO2 26 04/09/2016   TSH 2.23 04/09/2016   PSA 12.45 (H) 02/06/2016   INR 1.18 05/27/2015   HGBA1C 6.3 (H) 10/26/2012    No results found.  Assessment & Plan:   There are no diagnoses linked to this encounter. I am having [name] Christensen maintain his Vitamin D3, aspirin, alfuzosin, multivitamin with minerals, ranitidine, betamethasone dipropionate, vitamin B-12, diazepam, clotrimazole-betamethasone, clopidogrel, atenolol, nitroGLYCERIN, atorvastatin, losartan, and linaclotide.  No orders of the defined types were placed in this encounter.    Follow-up: No Follow-up on file.  [name], MD

## 2016-09-11 ENCOUNTER — Other Ambulatory Visit (INDEPENDENT_AMBULATORY_CARE_PROVIDER_SITE_OTHER): Payer: Medicare Other

## 2016-09-11 DIAGNOSIS — K5901 Slow transit constipation: Secondary | ICD-10-CM | POA: Diagnosis not present

## 2016-09-11 DIAGNOSIS — E785 Hyperlipidemia, unspecified: Secondary | ICD-10-CM | POA: Diagnosis not present

## 2016-09-11 DIAGNOSIS — I10 Essential (primary) hypertension: Secondary | ICD-10-CM | POA: Diagnosis not present

## 2016-09-11 LAB — CBC WITH DIFFERENTIAL/PLATELET
Basophils Absolute: 0 10*3/uL (ref 0.0–0.1)
Basophils Relative: 0.4 % (ref 0.0–3.0)
Eosinophils Absolute: 0.1 10*3/uL (ref 0.0–0.7)
Eosinophils Relative: 1.6 % (ref 0.0–5.0)
HCT: 40.3 % (ref 39.0–52.0)
Hemoglobin: 13.2 g/dL (ref 13.0–17.0)
Lymphocytes Relative: 28.3 % (ref 12.0–46.0)
Lymphs Abs: 1.9 10*3/uL (ref 0.7–4.0)
MCHC: 32.8 g/dL (ref 30.0–36.0)
MCV: 91.8 fl (ref 78.0–100.0)
Monocytes Absolute: 0.4 10*3/uL (ref 0.1–1.0)
Monocytes Relative: 6.5 % (ref 3.0–12.0)
Neutro Abs: 4.1 10*3/uL (ref 1.4–7.7)
Neutrophils Relative %: 63.2 % (ref 43.0–77.0)
Platelets: 107 10*3/uL — ABNORMAL LOW (ref 150.0–400.0)
RBC: 4.39 Mil/uL (ref 4.22–5.81)
RDW: 14.6 % (ref 11.5–15.5)
WBC: 6.6 10*3/uL (ref 4.0–10.5)

## 2016-09-11 LAB — BASIC METABOLIC PANEL
BUN: 14 mg/dL (ref 6–23)
CO2: 29 mEq/L (ref 19–32)
Calcium: 10.9 mg/dL — ABNORMAL HIGH (ref 8.4–10.5)
Chloride: 102 mEq/L (ref 96–112)
Creatinine, Ser: 0.97 mg/dL (ref 0.40–1.50)
GFR: 78.77 mL/min (ref >60.00)
Glucose, Bld: 98 mg/dL (ref 70–99)
Potassium: 4.9 mEq/L (ref 3.5–5.1)
Sodium: 136 mEq/L (ref 135–145)

## 2016-09-11 LAB — HEPATIC FUNCTION PANEL
ALT: 24 U/L (ref 0–53)
AST: 27 U/L (ref 0–37)
Albumin: 4.2 g/dL (ref 3.5–5.2)
Alkaline Phosphatase: 98 U/L (ref 39–117)
Bilirubin, Direct: 0.3 mg/dL (ref 0.0–0.3)
Total Bilirubin: 1.1 mg/dL (ref 0.2–1.2)
Total Protein: 7.5 g/dL (ref 6.0–8.3)

## 2016-09-11 LAB — LIPID PANEL
Cholesterol: 118 mg/dL (ref 0–200)
HDL: 49.6 mg/dL (ref >39.00)
LDL Cholesterol: 56 mg/dL (ref 0–99)
NonHDL: 68.02
Total CHOL/HDL Ratio: 2
Triglycerides: 62 mg/dL (ref 0.0–149.0)
VLDL: 12.4 mg/dL (ref 0.0–40.0)

## 2016-09-11 LAB — TSH: TSH: 1.49 u[IU]/mL (ref 0.35–4.50)

## 2016-09-13 ENCOUNTER — Other Ambulatory Visit: Payer: Self-pay | Admitting: Internal Medicine

## 2016-09-30 ENCOUNTER — Telehealth: Payer: Self-pay | Admitting: Cardiology

## 2016-09-30 ENCOUNTER — Ambulatory Visit (INDEPENDENT_AMBULATORY_CARE_PROVIDER_SITE_OTHER): Payer: Medicare Other | Admitting: *Deleted

## 2016-09-30 ENCOUNTER — Telehealth: Payer: Self-pay | Admitting: *Deleted

## 2016-09-30 DIAGNOSIS — I469 Cardiac arrest, cause unspecified: Secondary | ICD-10-CM | POA: Diagnosis not present

## 2016-09-30 NOTE — Telephone Encounter (Signed)
OK to fill this prescription with additional refills x2 Thank you!  

## 2016-09-30 NOTE — Progress Notes (Signed)
Remote ICD transmission.   

## 2016-09-30 NOTE — Telephone Encounter (Signed)
LMOVM reminding pt to send remote transmission.   

## 2016-09-30 NOTE — Telephone Encounter (Signed)
Rec'd fax pt requesting refill on diltiazem 5 mg. Last filled 08/01/15...Johny Chess

## 2016-10-01 MED ORDER — DIAZEPAM 5 MG PO TABS: 2.5000 mg | ORAL_TABLET | Freq: Two times a day (BID) | ORAL | 2 refills | Status: DC | PRN

## 2016-10-01 NOTE — Telephone Encounter (Signed)
Med is Diazepam not diltiazem called refill into [facility] had to leave on pharmacy vm...[name] Christensen

## 2016-10-02 ENCOUNTER — Ambulatory Visit (INDEPENDENT_AMBULATORY_CARE_PROVIDER_SITE_OTHER): Payer: Medicare Other | Admitting: Internal Medicine

## 2016-10-02 ENCOUNTER — Encounter: Payer: Self-pay | Admitting: Internal Medicine

## 2016-10-02 ENCOUNTER — Encounter: Payer: Self-pay | Admitting: Cardiology

## 2016-10-02 DIAGNOSIS — I255 Ischemic cardiomyopathy: Secondary | ICD-10-CM

## 2016-10-02 DIAGNOSIS — I10 Essential (primary) hypertension: Secondary | ICD-10-CM

## 2016-10-02 DIAGNOSIS — K5901 Slow transit constipation: Secondary | ICD-10-CM | POA: Diagnosis not present

## 2016-10-02 DIAGNOSIS — I2510 Atherosclerotic heart disease of native coronary artery without angina pectoris: Secondary | ICD-10-CM | POA: Diagnosis not present

## 2016-10-02 DIAGNOSIS — E785 Hyperlipidemia, unspecified: Secondary | ICD-10-CM | POA: Diagnosis not present

## 2016-10-02 NOTE — Assessment & Plan Note (Signed)
Cont [facility] Worse. GI ref - Dr Henrene Pastor

## 2016-10-02 NOTE — Assessment & Plan Note (Signed)
On Lipitor 

## 2016-10-02 NOTE — Progress Notes (Signed)
Subjective:  Patient ID: [name] Christensen, male    DOB: 07/26/1932  Age: 82 y.o. MRN: [id number]  CC: No chief complaint on file.   HPI [name] Christensen presents for CAD, BPH, constipation - not better  Outpatient Medications Prior to Visit  Medication Sig Dispense Refill  . alfuzosin (UROXATRAL) 10 MG 24 hr tablet Take 10 mg by mouth every evening.     [name] aspirin 81 MG chewable tablet Chew 1 tablet (81 mg total) by mouth daily.    [name] atenolol (TENORMIN) 50 MG tablet Take 1 tablet (50 mg total) by mouth daily. 30 tablet 11  . atorvastatin (LIPITOR) 20 MG tablet Take 1 tablet (20 mg total) by mouth daily at 6 PM. 90 tablet 3  . betamethasone dipropionate (DIPROLENE) 0.05 % cream Apply topically 2 (two) times daily. 30 g 3  . Cholecalciferol (VITAMIN D3) 1000 UNITS CAPS Take 1 capsule by mouth daily.      . clopidogrel (PLAVIX) 75 MG tablet TAKE ONE (1) TABLET BY MOUTH EVERY DAY 30 tablet 5  . clotrimazole-betamethasone (LOTRISONE) cream Apply 1 application topically 2 (two) times daily. 45 g 3  . diazepam (VALIUM) 5 MG tablet Take 0.5-1 tablets (2.5-5 mg total) by mouth every 12 (twelve) hours as needed for anxiety. 30 tablet 2  . linaclotide (LINZESS) 290 MCG CAPS capsule Take 1 capsule (290 mcg total) by mouth daily. 30 capsule 11  . losartan (COZAAR) 50 MG tablet Take 1 tablet (50 mg total) by mouth daily. 90 tablet 3  . Multiple Vitamin (MULTIVITAMIN WITH MINERALS) TABS Take 1 tablet by mouth daily.    . nitroGLYCERIN (NITROSTAT) 0.4 MG SL tablet Place 1 tablet (0.4 mg total) under the tongue every 5 (five) minutes as needed for chest pain (3 doses max). 25 tablet 3  . ranitidine (ZANTAC) 150 MG capsule Take 150 mg by mouth as needed for heartburn.     . vitamin B-12 (CYANOCOBALAMIN) 1000 MCG tablet Take 1,000 mcg by mouth daily.     No facility-administered medications prior to visit.     ROS Review of Systems  Constitutional: Negative for appetite change, fatigue and unexpected  weight change.  HENT: Negative for congestion, nosebleeds, sneezing, sore throat and trouble swallowing.   Eyes: Negative for itching and visual disturbance.  Respiratory: Negative for cough.   Cardiovascular: Negative for chest pain, palpitations and leg swelling.  Gastrointestinal: Positive for constipation. Negative for abdominal distention, anal bleeding, blood in stool, diarrhea, nausea and rectal pain.  Genitourinary: Negative for frequency and hematuria.  Musculoskeletal: Negative for back pain, gait problem, joint swelling and neck pain.  Skin: Negative for rash.  Neurological: Negative for dizziness, tremors, speech difficulty and weakness.  Psychiatric/Behavioral: Negative for agitation, dysphoric mood and sleep disturbance. The patient is not nervous/anxious.     Objective:  BP (!) 148/68   Pulse 65   Wt 153 lb (69.4 kg)   SpO2 98%   BMI 23.26 kg/m   BP Readings from Last 3 Encounters:  10/02/16 (!) 148/68  08/05/16 130/88  06/04/16 140/64    Wt Readings from Last 3 Encounters:  10/02/16 153 lb (69.4 kg)  08/05/16 152 lb (68.9 kg)  06/04/16 150 lb (68 kg)    Physical Exam  Constitutional: He is oriented to person, place, and time. He appears well-developed. No distress.  NAD  HENT:  Mouth/Throat: Oropharynx is clear and moist.  Eyes: Conjunctivae are normal. Pupils are equal, round, and reactive to light.  Neck: Normal range of motion. No JVD present. No thyromegaly present.  Cardiovascular: Normal rate, regular rhythm, normal heart sounds and intact distal pulses.  Exam reveals no gallop and no friction rub.   No murmur heard. Pulmonary/Chest: Effort normal and breath sounds normal. No respiratory distress. He has no wheezes. He has no rales. He exhibits no tenderness.  Abdominal: Soft. Bowel sounds are normal. He exhibits no distension and no mass. There is no tenderness. There is no rebound and no guarding.  Musculoskeletal: Normal range of motion. He  exhibits no edema or tenderness.  Lymphadenopathy:    He has no cervical adenopathy.  Neurological: He is alert and oriented to person, place, and time. He has normal reflexes. No cranial nerve deficit. He exhibits normal muscle tone. He displays a negative Romberg sign. Coordination and gait normal.  Skin: Skin is warm and dry. No rash noted.  Psychiatric: He has a normal mood and affect. His behavior is normal. Judgment and thought content normal.    Lab Results  Component Value Date   WBC 6.6 09/11/2016   HGB 13.2 09/11/2016   HCT 40.3 09/11/2016   PLT 107.0 (L) 09/11/2016   GLUCOSE 98 09/11/2016   CHOL 118 09/11/2016   TRIG 62.0 09/11/2016   HDL 49.60 09/11/2016   LDLCALC 56 09/11/2016   ALT 24 09/11/2016   AST 27 09/11/2016   NA 136 09/11/2016   K 4.9 09/11/2016   CL 102 09/11/2016   CREATININE 0.97 09/11/2016   BUN 14 09/11/2016   CO2 29 09/11/2016   TSH 1.49 09/11/2016   PSA 12.45 (H) 02/06/2016   INR 1.18 05/27/2015   HGBA1C 6.3 (H) 11/21/2012    No results found.  Assessment & Plan:   There are no diagnoses linked to this encounter. I am having Mr. Kruchten maintain his Vitamin D3, aspirin, alfuzosin, multivitamin with minerals, ranitidine, betamethasone dipropionate, vitamin B-12, clotrimazole-betamethasone, atenolol, nitroGLYCERIN, atorvastatin, losartan, linaclotide, clopidogrel, and diazepam.  No orders of the defined types were placed in this encounter.    Follow-up: No Follow-up on file.  [name], MD

## 2016-10-02 NOTE — Assessment & Plan Note (Signed)
Losartan, Atenolol 

## 2016-10-02 NOTE — Assessment & Plan Note (Signed)
Cont with Atenolol, ASA, Plavix, Lipitor, Losartan

## 2016-10-02 NOTE — Progress Notes (Signed)
Pre visit review using our clinic review tool, if applicable. No additional management support is needed unless otherwise documented below in the visit note. 

## 2016-10-02 NOTE — Addendum Note (Signed)
Addended by: Cassandria Anger on: 10/02/2016 02:02 PM   Modules accepted: Orders

## 2016-10-07 ENCOUNTER — Ambulatory Visit: Payer: Medicare Other | Admitting: Cardiovascular Disease

## 2016-10-10 ENCOUNTER — Ambulatory Visit: Payer: Medicare Other | Admitting: Internal Medicine

## 2016-10-13 LAB — CUP PACEART REMOTE DEVICE CHECK
Battery Remaining Longevity: 65 mo
Brady Statistic RV Percent Paced: 1.8 %
Date Time Interrogation Session: 20171119141524
HighPow Impedance: 70 Ohm
Implantable Lead Implant Date: 20131218
Implantable Lead Location: 753860
Implantable Pulse Generator Implant Date: 20131218
Lead Channel Impedance Value: 360 Ohm
Lead Channel Pacing Threshold Amplitude: 1 V
Lead Channel Pacing Threshold Pulse Width: 0.8 ms
Lead Channel Sensing Intrinsic Amplitude: 5.8 mV
Pulse Gen Serial Number: 7048849

## 2016-10-16 ENCOUNTER — Encounter: Payer: Self-pay | Admitting: Cardiology

## 2016-11-04 ENCOUNTER — Encounter: Payer: Self-pay | Admitting: Internal Medicine

## 2016-11-19 NOTE — Progress Notes (Signed)
Patient ID: [name] Christensen, male   DOB: March 01, 1932, 82 y.o.   MRN: [id number] [name] Christensen is a 82 y.o.  male   Seen in f/u for CAD, ischemic DCM with single chamber AICD  In May Dr. Renaldo Reel change his atenolol to carvedilol which has been associated with dizziness and headache. So back on atenolol He has a history of ischemic heart disease with prior bypass surgery. His cardiac enzymes were negative. He is status post ICD implantation-single chamber undertaken for aborted cardiac arrest.   Catheterization 10/2012 demonstrating patent grafts with an atretic LIMA; LV function was normal  No angina or AICD d/c  Still working at [employer] last 01/18/16 EF 50-55%  Mild MR  Having frequent PVC's holter 2015 with 10-15% PVCls echo EF ok so no Rx other than beta blocker offerred Seen by [facility] 01/15/16 and complained of "cold spells and vibratory sensation in right chest"   ROS: Denies fever, malais, weight loss, blurry vision, decreased visual acuity, cough, sputum, SOB, hemoptysis, pleuritic pain, palpitaitons, heartburn, abdominal pain, melena, lower extremity edema, claudication, or rash.  All other systems reviewed and negative  General: Affect appropriate Healthy:  appears stated age 82: normal Neck supple with no adenopathy JVP normal no bruits no thyromegaly Lungs clear with no wheezing and good diaphragmatic motion Heart:  S1/S2 no murmur, no rub, gallop or click  AICD under left clavicle  PMI normal Abdomen: benighn, BS positve, no tenderness, no AAA no bruit.  No HSM or HJR Distal pulses intact with no bruits No edema Neuro non-focal Skin warm and dry No muscular weakness   Current Outpatient Prescriptions  Medication Sig Dispense Refill  . alfuzosin (UROXATRAL) 10 MG 24 hr tablet Take 10 mg by mouth every evening.     [name] aspirin 81 MG chewable tablet Chew 1 tablet (81 mg total) by mouth daily.    [name] atenolol (TENORMIN) 50 MG tablet Take 1 tablet (50 mg total) by  mouth daily. 30 tablet 11  . atorvastatin (LIPITOR) 20 MG tablet Take 1 tablet (20 mg total) by mouth daily at 6 PM. 90 tablet 3  . betamethasone dipropionate (DIPROLENE) 0.05 % cream Apply topically 2 (two) times daily. 30 g 3  . Cholecalciferol (VITAMIN D3) 1000 UNITS CAPS Take 1 capsule by mouth daily.      . clopidogrel (PLAVIX) 75 MG tablet TAKE ONE (1) TABLET BY MOUTH EVERY DAY 30 tablet 5  . clotrimazole-betamethasone (LOTRISONE) cream Apply 1 application topically 2 (two) times daily. 45 g 3  . diazepam (VALIUM) 5 MG tablet Take 0.5-1 tablets (2.5-5 mg total) by mouth every 12 (twelve) hours as needed for anxiety. 30 tablet 2  . linaclotide (LINZESS) 290 MCG CAPS capsule Take 1 capsule (290 mcg total) by mouth daily. 30 capsule 11  . losartan (COZAAR) 50 MG tablet Take 1 tablet (50 mg total) by mouth daily. 90 tablet 3  . Multiple Vitamin (MULTIVITAMIN WITH MINERALS) TABS Take 1 tablet by mouth daily.    . nitroGLYCERIN (NITROSTAT) 0.4 MG SL tablet Place 1 tablet (0.4 mg total) under the tongue every 5 (five) minutes as needed for chest pain (3 doses max). 25 tablet 3  . ranitidine (ZANTAC) 150 MG capsule Take 150 mg by mouth as needed for heartburn.     . vitamin B-12 (CYANOCOBALAMIN) 1000 MCG tablet Take 1,000 mcg by mouth daily.     No current facility-administered medications for this visit.     Allergies  Ezetimibe;  Niacin; Penicillins; Pravastatin sodium; and Rosuvastatin  Electrocardiogram:   05/18/15  SR RBBB PVC  10/30/15  SR rate 68 PR 256 PVC RBBB  No change  11/27/16 SR rate 77 PR 278 frequent PVCls RBBB old IMI inferior T wave changes  Assessment and Plan CAD:  Distant CABG no angina continue medical Rx including beta blocker  AICD:  Normal f/unction f/u Dr [name] HTN:  Well controlled.  Continue current medications and low sodium Dash type diet.   Chol:   Lab Results  Component Value Date   LDLCALC 56 09/11/2016  PVC:  Asymptomatic continue beta blocker concern for  frequency Discussed with EP tech And cannot quantify from his AICD system will get 48 hr holter and Dr [name] can decide If any other suppressive Rx warranted    Jenkins Rouge

## 2016-11-27 ENCOUNTER — Ambulatory Visit (INDEPENDENT_AMBULATORY_CARE_PROVIDER_SITE_OTHER): Payer: Medicare Other | Admitting: Cardiovascular Disease

## 2016-11-27 ENCOUNTER — Other Ambulatory Visit: Payer: Self-pay | Admitting: Internal Medicine

## 2016-11-27 ENCOUNTER — Encounter: Payer: Self-pay | Admitting: Cardiovascular Disease

## 2016-11-27 ENCOUNTER — Ambulatory Visit (INDEPENDENT_AMBULATORY_CARE_PROVIDER_SITE_OTHER): Payer: Medicare Other

## 2016-11-27 VITALS — BP 124/70 | HR 77 | Ht 68.0 in | Wt 154.0 lb

## 2016-11-27 DIAGNOSIS — Z951 Presence of aortocoronary bypass graft: Secondary | ICD-10-CM

## 2016-11-27 DIAGNOSIS — I493 Ventricular premature depolarization: Secondary | ICD-10-CM

## 2016-11-27 NOTE — Patient Instructions (Addendum)
Medication Instructions:  Your physician recommends that you continue on your current medications as directed. Please refer to the Current Medication list given to you today.  Labwork: NONE  Testing/Procedures: Your physician has recommended that you wear a 48 hour holter monitor. Holter monitors are medical devices that record the heart's electrical activity. Doctors most often use these monitors to diagnose arrhythmias. Arrhythmias are problems with the speed or rhythm of the heartbeat. The monitor is a small, portable device. You can wear one while you do your normal daily activities. This is usually used to diagnose what is causing palpitations/syncope (passing out).  Follow-Up: Your physician wants you to follow-up in: 8 months with Dr. Johnsie Cancel. You will receive a reminder letter in the mail two months in advance. If you don't receive a letter, please call our office to schedule the follow-up appointment.   If you need a refill on your cardiac medications before your next appointment, please call your pharmacy.

## 2016-12-04 ENCOUNTER — Encounter: Payer: Self-pay | Admitting: Internal Medicine

## 2016-12-04 DIAGNOSIS — H43813 Vitreous degeneration, bilateral: Secondary | ICD-10-CM | POA: Diagnosis not present

## 2016-12-06 IMAGING — CT CT ABD-PELV W/ CM
2 of 5 series · 16 of 46 positions shown, 18 images · IV contrast (READICAT/WATER & [ID] OMNI 300)
Comparison: None.

CLINICAL DATA: Lower abdominal and pelvic pain for 8 weeks,
constipation, inguinal hernia.

EXAM:
CT ABDOMEN AND PELVIS WITH CONTRAST
TECHNIQUE: Multidetector CT imaging of the abdomen and pelvis was performed
using the standard protocol following bolus administration of
intravenous contrast.
CONTRAST:  100mL OMNIPAQUE IOHEXOL 300 MG/ML  SOLN

[Series 2: abd/pelvis with · axial · 0.70mm/px · z∈[-336,-16]mm · 13 of 74 slices shown, 15 images]
[im 5/74  soft-tissue]
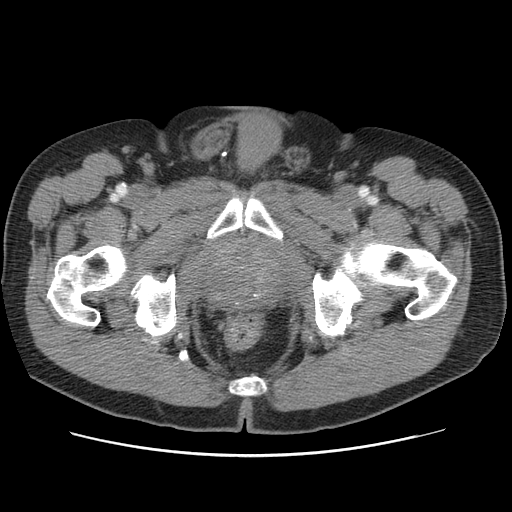
[im 5/74  bone]
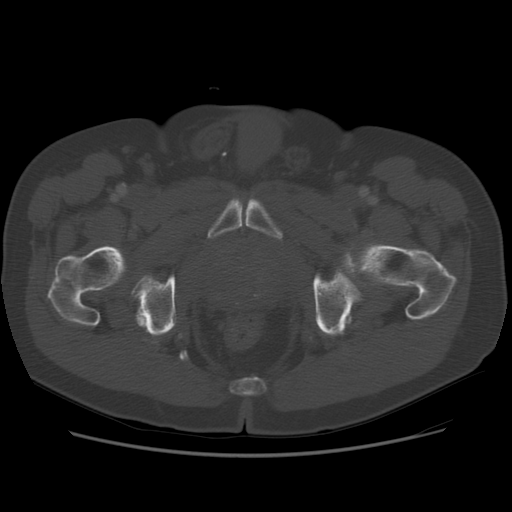
[im 9/74  soft-tissue]
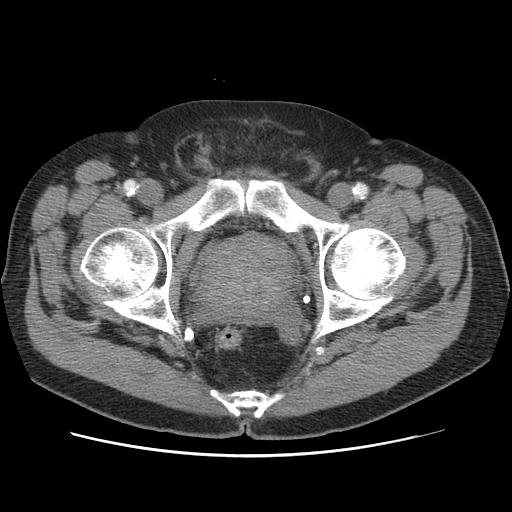
[im 17/74  soft-tissue]
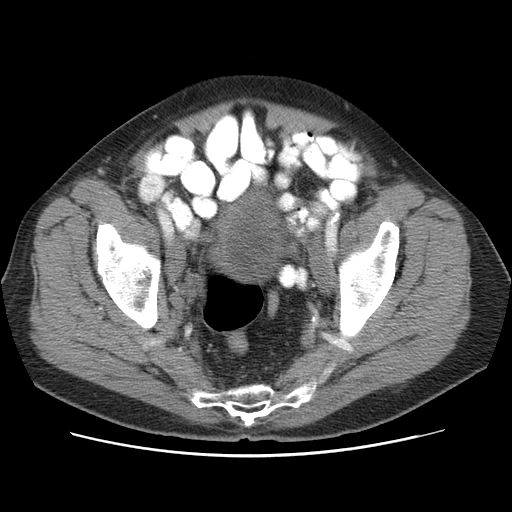
[im 21/74  soft-tissue]
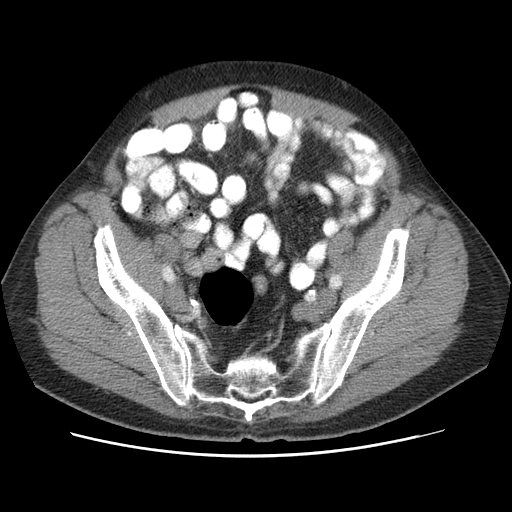
[im 25/74  soft-tissue]
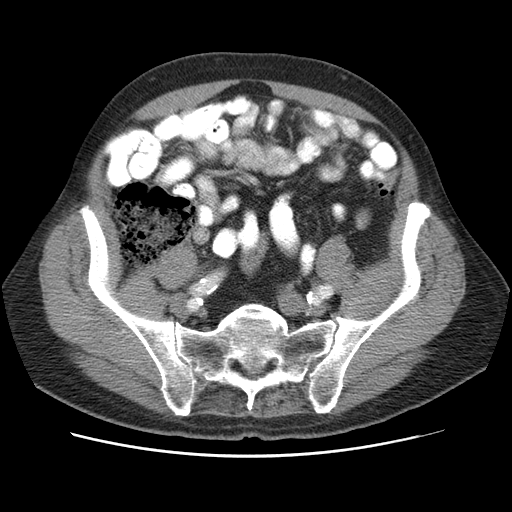
[im 33/74  soft-tissue]
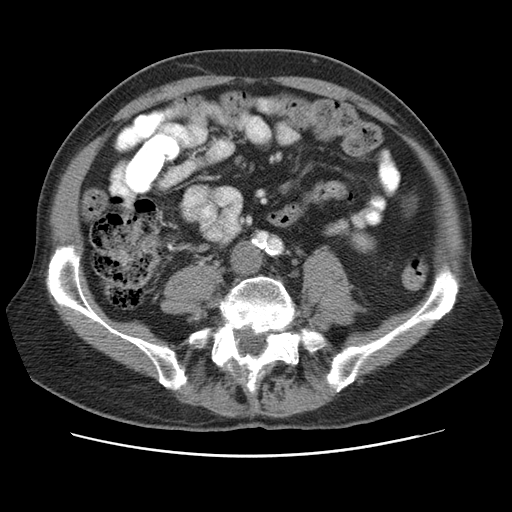
[im 37/74  soft-tissue]
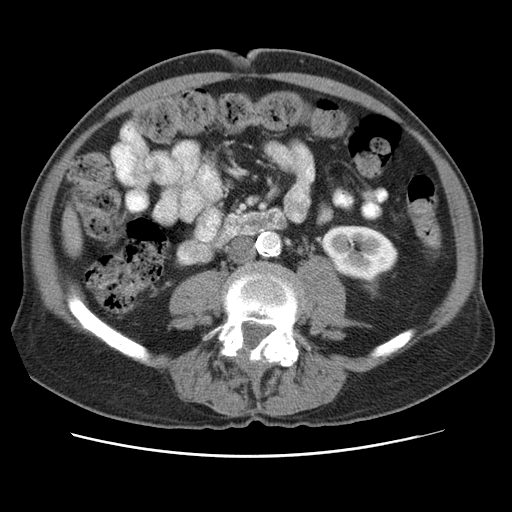
[im 41/74  soft-tissue]
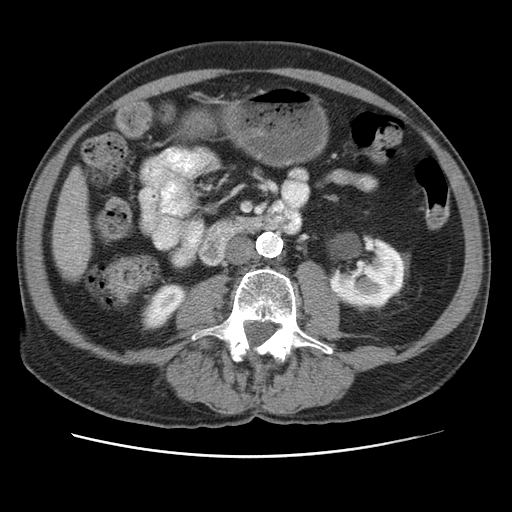
[im 49/74  soft-tissue]
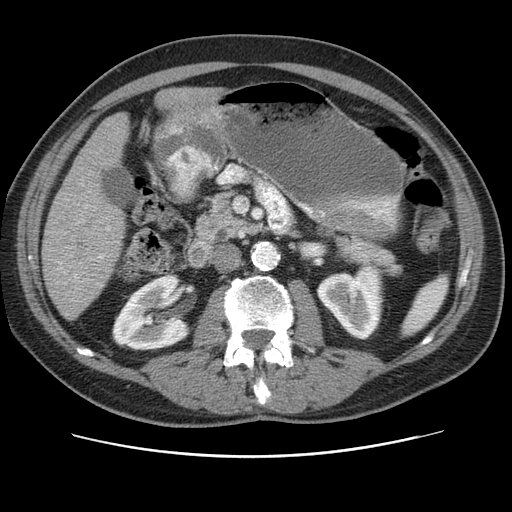
[im 49/74  bone]
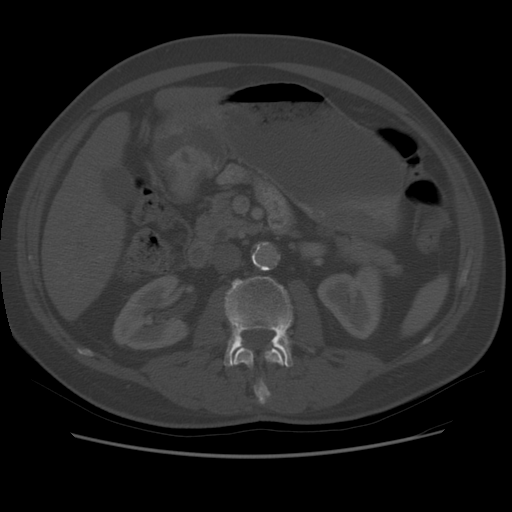
[im 53/74  soft-tissue]
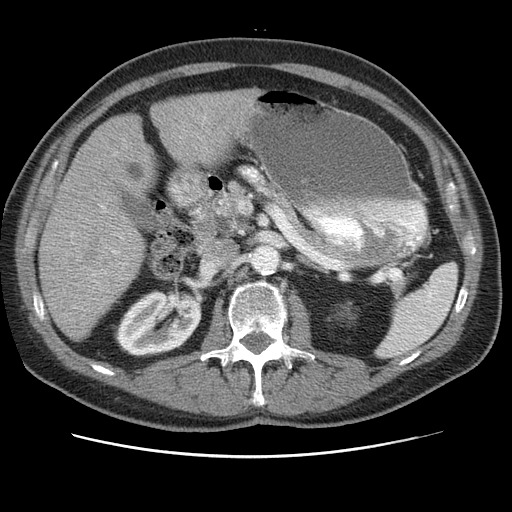
[im 57/74  soft-tissue]
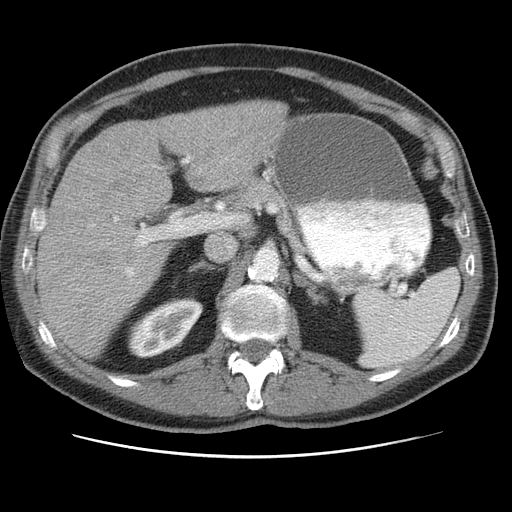
[im 65/74  soft-tissue]
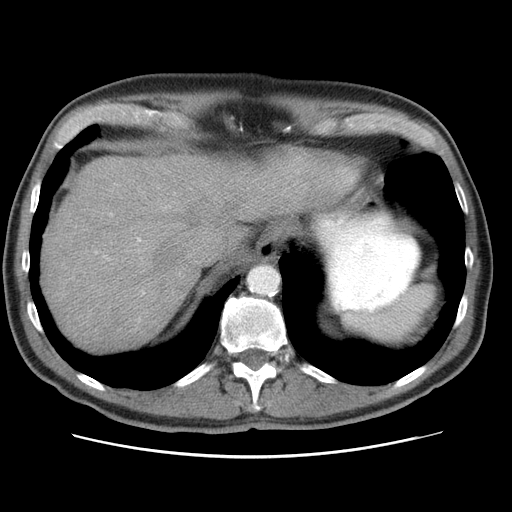
[im 69/74  soft-tissue]
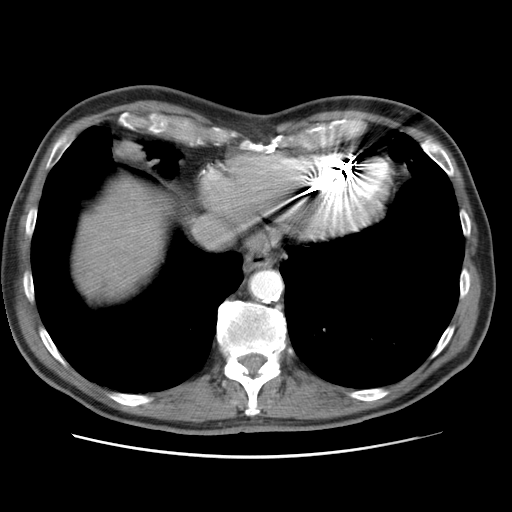

[Series 400: cor · coronal · 0.77mm/px · 3 of 148 slices shown]
[im 50/148  soft-tissue]
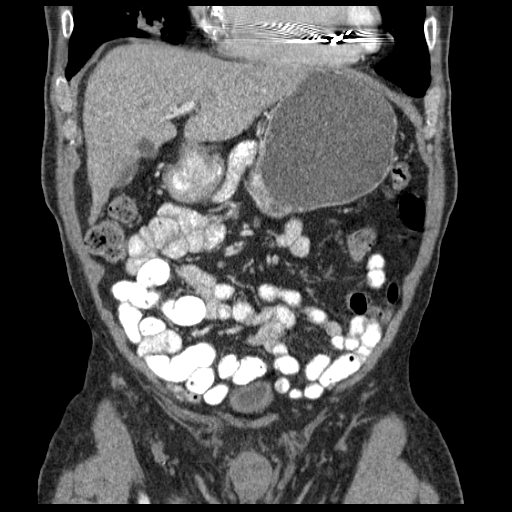
[im 66/148  soft-tissue]
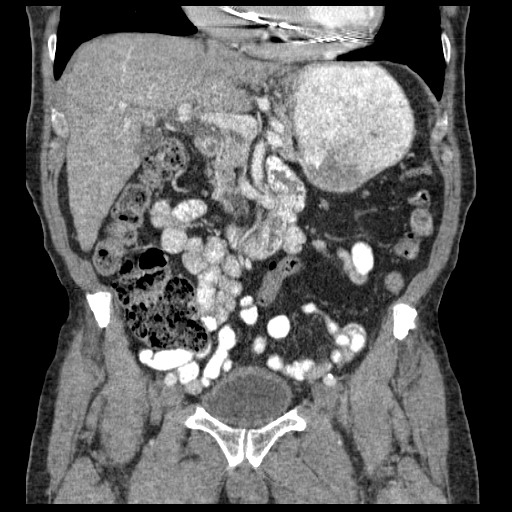
[im 82/148  soft-tissue]
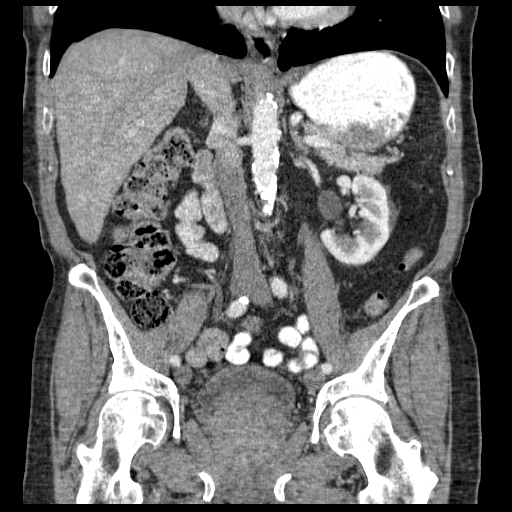

[16 of 46 positions shown; findings below may reference images not displayed]

FINDINGS: Inferior right middle lobe wedge-shaped opacity along the
bronchovascular structures could represent area of chronic
scarring/atelectasis, suspect postinflammatory/infectious. Otherwise
lung bases are clear. Very minimal lingula bronchiectasis noted
incidentally. Prior median sternotomy. Pacer wires in the
right-sided heart. Normal heart size. No pericardial or pleural
effusion. No significant hiatal hernia.

Abdomen: Small hypodense 11 mm cyst in the left hepatic lobe medial
segment adjacent to the gallbladder, image 23. No other focal
hepatic abnormality. Hepatic veins are not opacified. This is
related to the phase of imaging. Portal vein is patent. Gallbladder,
biliary system, pancreas, spleen, adrenal glands, and kidneys are
within normal limits for age and demonstrate no acute process.
Kidneys demonstrate no renal obstruction or hydronephrosis.
Extrarenal pelvis noted on the left.

Negative for bowel obstruction, dilatation, ileus, or free air.

No abdominal free fluid, fluid collection, hemorrhage, abscess, or
adenopathy.

Calcific atherosclerosis of the aorta without aneurysm or
retroperitoneal hemorrhage. No dissection. Negative for occlusive
process.

Scattered stool throughout the colon noted. Appendix is not
identified.

Pelvis: Iliac atherosclerosis noted. No pelvic free fluid, fluid
collection, hemorrhage, abscess, or adenopathy. Prostate gland is
enlarged measuring 6.7 cm in transverse dimension. Distal colon is
collapsed. Scattered diverticulosis noted without acute
inflammation. Small fat containing right inguinal hernia without
evidence of protruding bowel or incarceration.

Diffuse degenerative changes of the spine.
IMPRESSION: Inferior right middle lobe chronic atelectasis versus scarring.

Small left hepatic cyst

Aortic atherosclerosis without aneurysm or occlusive process. No
acute vascular findings.

Negative for bowel obstruction, ileus, or free air.

Incidental colonic diverticulosis

Prostate enlargement

Small fat containing right inguinal hernia

## 2016-12-20 DIAGNOSIS — R972 Elevated prostate specific antigen [PSA]: Secondary | ICD-10-CM | POA: Diagnosis not present

## 2016-12-20 DIAGNOSIS — N401 Enlarged prostate with lower urinary tract symptoms: Secondary | ICD-10-CM | POA: Diagnosis not present

## 2016-12-23 ENCOUNTER — Telehealth: Payer: Self-pay

## 2016-12-23 ENCOUNTER — Ambulatory Visit (INDEPENDENT_AMBULATORY_CARE_PROVIDER_SITE_OTHER): Payer: Medicare Other | Admitting: Internal Medicine

## 2016-12-23 ENCOUNTER — Encounter: Payer: Self-pay | Admitting: Internal Medicine

## 2016-12-23 VITALS — BP 140/62 | HR 68 | Ht 67.0 in | Wt 155.1 lb

## 2016-12-23 DIAGNOSIS — K649 Unspecified hemorrhoids: Secondary | ICD-10-CM

## 2016-12-23 DIAGNOSIS — R194 Change in bowel habit: Secondary | ICD-10-CM | POA: Diagnosis not present

## 2016-12-23 DIAGNOSIS — K5900 Constipation, unspecified: Secondary | ICD-10-CM | POA: Diagnosis not present

## 2016-12-23 MED ORDER — NA SULFATE-K SULFATE-MG SULF 17.5-3.13-1.6 GM/177ML PO SOLN: 1.0000 | Freq: Once | ORAL | 0 refills | Status: AC

## 2016-12-23 NOTE — Telephone Encounter (Signed)
Will route Dr. Kyla Balzarine response to Magda Paganini

## 2016-12-23 NOTE — Patient Instructions (Signed)

## 2016-12-23 NOTE — Telephone Encounter (Signed)
  RE: [name] Christensen DOB: Feb 22, 1932 MRN: [id number]   Dear Johnsie Cancel,    We have scheduled the above patient for an endoscopic procedure. Our records show that he is on anticoagulation therapy.   Please advise as to how long the patient may come off his therapy of Plavix prior to the procedure, which is scheduled for 01/02/2017.  Please fax back/ or route the completed form to  at [phone].   Sincerely,    Phillis Haggis

## 2016-12-23 NOTE — Telephone Encounter (Signed)
Left message for patient regarding his Plavix. Asked for return phone call

## 2016-12-23 NOTE — Progress Notes (Signed)
HISTORY OF PRESENT ILLNESS:  [name] Christensen is a 83 y.o. male with past medical history including coronary artery disease with prior CABG, ejection fraction 40-45%, status post AICD placement, on chronic Plavix therapy, and others as listed below. He is sent today by his primary care provider regarding problems with abdominal pain, change in bowel habits, and constipation. Patient reports a 4-6 month history of constipation associated with lower abdominal discomfort. He points to the entire lower abdomen below the umbilicus. He describes a sensation of inability to evacuate. He is concerned about an obstructing process. He is on MiraLAX for about one week with some improvement. He is very concerned about what is causing his issues with constipation. He has been on in medications over time including his prostate medications started about 6 months ago. Patient has not had colonoscopy greater than 10 years. Reviewing the electronic medical record he underwent CT scan of the abdomen and pelvis with contrast January 2016 to evaluate lower abdominal and pelvic pain for 8 weeks associated with constipation and inguinal hernia. No significant abnormalities. Noted to have incidental diverticulosis and small fat-containing right inguinal hernia which was treated by Dr. gross. Patient denies bleeding. He also complains of prolapsing hemorrhoids for which she is interested in possible therapy including banding in office procedure. Review of his blood work from October 2017 findings unremarkable comprehensive metabolic panel and CBC. The patient remains extremely active and works as a Customer service manager  REVIEW OF SYSTEMS:  All non-GI ROS negative except for back pain  Past Medical History:  Diagnosis Date  . AICD (automatic cardioverter/defibrillator) present 2013  . BPH (benign prostatic hypertrophy)    elevated PSA Dr. Lindell Noe Bx 2010  . Cardiac arrest [facility])    a. out-of-hospital arrest 10/27/2012 - EF 40-45%,  patent grafts on cath, received [employer] AICD.  [name] Carotid disease, bilateral (HCC)    a. 0-39% by doppers.  . CHF (congestive heart failure) (HCC)   . Constipation   . Coronary artery disease    a. s/p MI/CABG 2005. b. s/p cath at time of VF arrest 10/2013 - grafts patent.  . Elevated LFTs    a. 10/2012 felt due to cardiac arrest - Hepatitis C Ab reactive from 11/18/2012>>Hep C RNA PCR negative 11/16/2012.   [name] GERD (gastroesophageal reflux disease)   . Hyperlipidemia   . Hypertension   . Presence of permanent cardiac pacemaker 2013  . RBBB   . Ventricular bigeminy    a. Event monitor 01/2013: NSR with PVCs and occ bigeminy.    Past Surgical History:  Procedure Laterality Date  . APPENDECTOMY    . BACK SURGERY     x 2- then fusion  . CARDIAC CATHETERIZATION  2007   with patent graft anatomy atretic left internal mammary  artery to the LAD which is nonobstructive. Will restart study June 08, 2007  . CARDIAC DEFIBRILLATOR PLACEMENT  dec 2013   Dr. Graciela Husbands, [facility]. Jude  . CATARACT EXTRACTION W/PHACO Right 04/23/2016   Procedure: CATARACT EXTRACTION PHACO AND INTRAOCULAR LENS PLACEMENT (IOC);  Surgeon: [name], MD;  Location: [facility] ORS;  Service: Ophthalmology;  Laterality: Right;  [place] 1.09 AP% 19.3 CDE 13.38 Fluid pack lot # [id number]  . CORONARY ARTERY BYPASS GRAFT  2005  . EYE SURGERY    . HERNIA REPAIR    . IMPLANTABLE CARDIOVERTER DEFIBRILLATOR IMPLANT N/A December 01, 2012   Procedure: IMPLANTABLE CARDIOVERTER DEFIBRILLATOR IMPLANT;  Surgeon: Duke Salvia, MD;  Location: [facility] C[facility] LAB;  Service:  Cardiovascular;  Laterality: N/A;  . INGUINAL HERNIA REPAIR Bilateral 01/17/2015   Procedure: BILATERAL LAPAROSCOPIC INGUINAL HERNIA REPAIR WITH LEFT FEMORAL HERNIA REPAIR;  Surgeon: [name], MD;  Location: [facility] OR;  Service: General;  Laterality: Bilateral;  . INSERTION OF MESH Bilateral 01/17/2015   Procedure: INSERTION OF MESH;  Surgeon: [name], MD;  Location: [facility] OR;  Service: General;   Laterality: Bilateral;  . LEFT HEART CATHETERIZATION WITH CORONARY/GRAFT ANGIOGRAM N/A 10/26/2012   Procedure: LEFT HEART CATHETERIZATION WITH Isabel Caprice;  Surgeon: [name], MD;  Location: [facility] CATH LAB;  Service: Cardiovascular;  Laterality: N/A;  . LUMBAR FUSION  09/2007    Social History [name] Christensen  reports that he has never smoked. He has never used smokeless tobacco. He reports that he does not drink alcohol or use drugs.  family history includes Coronary artery disease in his mother; Heart attack in his brother.  Allergies  Allergen Reactions  . Ezetimibe     REACTION: numbness  . Niacin     REACTION: CP  . Penicillins Other (See Comments)    Blisters between fingers @ 15  . Pravastatin Sodium Other (See Comments)    Aches and pains in joints  . Rosuvastatin     REACTION: aches       PHYSICAL EXAMINATION: Vital signs: BP 140/62   Pulse 68   Ht 5\' 7"  (1.702 m) Comment: w/o shoes  Wt 155 lb 2 oz (70.4 kg)   BMI 24.30 kg/m   Constitutional: generally well-appearing, no acute distress Psychiatric: alert and oriented x3, cooperative Eyes: extraocular movements intact, anicteric, conjunctiva pink Mouth: oral pharynx moist, no lesions Neck: supple no lymphadenopathy Cardiovascular: heart regular rate and rhythm, no murmur Lungs: clear to auscultation bilaterally Abdomen: soft, nontender, nondistended, no obvious ascites, no peritoneal signs, normal bowel sounds, no organomegaly Rectal:Deferred until colonoscopy Extremities: no clubbing cyanosis or lower extremity edema bilaterally Skin: no lesions on visible extremities Neuro: No focal deficits. Cranial nerves intact  ASSESSMENT:  #1. Change in bowel habits, constipation, and abdominal discomfort. I suspect this is chronic functional constipation. However, greater than 10 years since last colonoscopy. CT scan 2 years ago as described #2. Multiple significant medical problems. On chronic  Plavix   PLAN:  #1. Colonoscopy. The patient is high risk.The nature of the procedure, as well as the risks, benefits, and alternatives were carefully and thoroughly reviewed with the patient. Ample time for discussion and questions allowed. The patient understood, was satisfied, and agreed to proceed. #2. Hold Plavix 1 week prior to the procedure. We will confer with his cardiologist Dr. [name] to see if this is acceptable #3. If colonoscopy without significant structural lesion, discussed strategies to address constipation and associated abdominal discomfort

## 2016-12-23 NOTE — Telephone Encounter (Signed)
Ok to hold plavix for 5 days before endoscopy

## 2016-12-24 ENCOUNTER — Ambulatory Visit (INDEPENDENT_AMBULATORY_CARE_PROVIDER_SITE_OTHER): Payer: Medicare Other | Admitting: Internal Medicine

## 2016-12-24 ENCOUNTER — Telehealth: Payer: Self-pay | Admitting: Internal Medicine

## 2016-12-24 ENCOUNTER — Encounter: Payer: Self-pay | Admitting: Internal Medicine

## 2016-12-24 VITALS — BP 122/70 | HR 40 | Ht 67.0 in | Wt 155.6 lb

## 2016-12-24 DIAGNOSIS — I255 Ischemic cardiomyopathy: Secondary | ICD-10-CM | POA: Diagnosis not present

## 2016-12-24 DIAGNOSIS — I480 Paroxysmal atrial fibrillation: Secondary | ICD-10-CM

## 2016-12-24 DIAGNOSIS — I493 Ventricular premature depolarization: Secondary | ICD-10-CM

## 2016-12-24 DIAGNOSIS — Z95810 Presence of automatic (implantable) cardiac defibrillator: Secondary | ICD-10-CM

## 2016-12-24 DIAGNOSIS — I2589 Other forms of chronic ischemic heart disease: Secondary | ICD-10-CM | POA: Diagnosis not present

## 2016-12-24 DIAGNOSIS — I519 Heart disease, unspecified: Secondary | ICD-10-CM

## 2016-12-24 LAB — CUP PACEART INCLINIC DEVICE CHECK
Brady Statistic RV Percent Paced: 2.2 %
Date Time Interrogation Session: 20180130153404
HighPow Impedance: 67.5 Ohm
Implantable Lead Implant Date: 20131218
Implantable Lead Location: 753860
Implantable Pulse Generator Implant Date: 20131218
Lead Channel Impedance Value: 412.5 Ohm
Lead Channel Pacing Threshold Amplitude: 1.25 V
Lead Channel Pacing Threshold Amplitude: 1.25 V
Lead Channel Pacing Threshold Pulse Width: 0.8 ms
Lead Channel Pacing Threshold Pulse Width: 0.8 ms
Lead Channel Sensing Intrinsic Amplitude: 4 mV
Lead Channel Setting Pacing Amplitude: 2.5 V
Lead Channel Setting Pacing Pulse Width: 0.8 ms
Lead Channel Setting Sensing Sensitivity: 0.5 mV
Pulse Gen Serial Number: 7048849

## 2016-12-24 MED ORDER — AMIODARONE HCL 200 MG PO TABS: ORAL_TABLET | ORAL | 0 refills | Status: DC

## 2016-12-24 MED ORDER — AMIODARONE HCL 200 MG PO TABS: ORAL_TABLET | ORAL | 6 refills | Status: DC

## 2016-12-24 NOTE — Progress Notes (Signed)
2015     Patient Care Team: Tresa Garter, MD as PCP - General (Internal Medicine) Wendall Stade, MD as Attending Physician (Cardiology) Dawna Part., MD (Urology) Hilarie Fredrickson, MD as Consulting Physician (Gastroenterology)   HPI  [name] Christensen is a 83 y.o. male Seen follow up for ICD implantation-single chamber undertaken for aborted cardiac arrest in the setting of ischemic heart disease with prior bypass surgery. He also has a history of frequent PVCs  Catheterization 12/13 demonstrating patent grafts with an atretic LIMA; LV function was normal S/p CABG  He was seen last month is noted to have significant ectopy with a pattern of bigeminy.   Holter demonstrated approximately 13% PVCs:  repeat Holter 1/15 also demonstrated frequent PVCs in the range of 10-15% 1/18 Holter 31% PVCs   Echo 2015 February EF 45-50% Echo 2017 February EF 50-55%  He complains of increasing impairment in exercise intolerance and shortness of breath although without chest pain.  He notes some recent sensations over his right chest. This is been going on about a week.  He continues to have spells where he gets cold for 10 minutes. These are unassociated with fevers, rigors, diaphoresis.  He is to have hernia surgery .    Past Medical History:  Diagnosis Date  . AICD (automatic cardioverter/defibrillator) present 2013  . BPH (benign prostatic hypertrophy)    elevated PSA Dr. Lindell Noe Bx 2010  . Cardiac arrest [facility])    a. out-of-hospital arrest 10/30/2012 - EF 40-45%, patent grafts on cath, received St[employer] Jude AICD.  [name] Carotid disease, bilateral (HCC)    a. 0-39% by doppers.  . CHF (congestive heart failure) (HCC)   . Constipation   . Coronary artery disease    a. s/p MI/CABG 2005. b. s/p cath at time of VF arrest 10/2013 - grafts patent.  . Elevated LFTs    a. 10/2012 felt due to cardiac arrest - Hepatitis C Ab reactive from 11/17/2012>>Hep C RNA PCR negative 11/18/2012.   [name] GERD  (gastroesophageal reflux disease)   . Hyperlipidemia   . Hypertension   . Presence of permanent cardiac pacemaker 2013  . RBBB   . Ventricular bigeminy    a. Event monitor 01/2013: NSR with PVCs and occ bigeminy.    Past Surgical History:  Procedure Laterality Date  . APPENDECTOMY    . BACK SURGERY     x 2- then fusion  . CARDIAC CATHETERIZATION  2007   with patent graft anatomy atretic left internal mammary  artery to the LAD which is nonobstructive. Will restart study June 08, 2007  . CARDIAC DEFIBRILLATOR PLACEMENT  dec 2013   Dr. Graciela Husbands, [facility]. Jude  . CATARACT EXTRACTION W/PHACO Right 04/23/2016   Procedure: CATARACT EXTRACTION PHACO AND INTRAOCULAR LENS PLACEMENT (IOC);  Surgeon: [name], MD;  Location: [facility] ORS;  Service: Ophthalmology;  Laterality: Right;  [place] 1.09 AP% 19.3 CDE 13.38 Fluid pack lot # [id number]  . CORONARY ARTERY BYPASS GRAFT  2005  . EYE SURGERY    . HERNIA REPAIR    . IMPLANTABLE CARDIOVERTER DEFIBRILLATOR IMPLANT N/A 11/21/2012   Procedure: IMPLANTABLE CARDIOVERTER DEFIBRILLATOR IMPLANT;  Surgeon: Duke Salvia, MD;  Location: [facility] CATH LAB;  Service: Cardiovascular;  Laterality: N/A;  . INGUINAL HERNIA REPAIR Bilateral 01/17/2015   Procedure: BILATERAL LAPAROSCOPIC INGUINAL HERNIA REPAIR WITH LEFT FEMORAL HERNIA REPAIR;  Surgeon: [name], MD;  Location: [facility] OR;  Service: General;  Laterality: Bilateral;  . INSERTION OF MESH Bilateral 01/17/2015  Procedure: INSERTION OF MESH;  Surgeon: [name], MD;  Location: [facility] OR;  Service: General;  Laterality: Bilateral;  . LEFT HEART CATHETERIZATION WITH CORONARY/GRAFT ANGIOGRAM N/A 10/25/2012   Procedure: LEFT HEART CATHETERIZATION WITH Isabel Caprice;  Surgeon: [name], MD;  Location: [facility] CATH LAB;  Service: Cardiovascular;  Laterality: N/A;  . LUMBAR FUSION  09/2007    Current Outpatient Prescriptions  Medication Sig Dispense Refill  . alfuzosin (UROXATRAL) 10 MG 24 hr tablet Take 10 mg  by mouth every evening.     [name] aspirin 81 MG chewable tablet Chew 1 tablet (81 mg total) by mouth daily.    [name] atenolol (TENORMIN) 50 MG tablet TAKE ONE (1) TABLET BY MOUTH EVERY DAY 90 tablet 2  . atorvastatin (LIPITOR) 20 MG tablet Take 1 tablet (20 mg total) by mouth daily at 6 PM. 90 tablet 3  . betamethasone dipropionate (DIPROLENE) 0.05 % cream Apply topically 2 (two) times daily. 30 g 3  . Cholecalciferol (VITAMIN D3) 1000 UNITS CAPS Take 1 capsule by mouth daily.      . clopidogrel (PLAVIX) 75 MG tablet TAKE ONE (1) TABLET BY MOUTH EVERY DAY 30 tablet 5  . clotrimazole-betamethasone (LOTRISONE) cream Apply 1 application topically 2 (two) times daily. 45 g 3  . diazepam (VALIUM) 5 MG tablet Take 0.5-1 tablets (2.5-5 mg total) by mouth every 12 (twelve) hours as needed for anxiety. 30 tablet 2  . dutasteride (AVODART) 0.5 MG capsule Take 0.5 mg by mouth daily.    [name] linaclotide (LINZESS) 290 MCG CAPS capsule Take 1 capsule (290 mcg total) by mouth daily. 30 capsule 11  . losartan (COZAAR) 50 MG tablet Take 1 tablet (50 mg total) by mouth daily. 90 tablet 3  . Multiple Vitamin (MULTIVITAMIN WITH MINERALS) TABS Take 1 tablet by mouth daily.    . nitroGLYCERIN (NITROSTAT) 0.4 MG SL tablet Place 1 tablet (0.4 mg total) under the tongue every 5 (five) minutes as needed for chest pain (3 doses max). 25 tablet 3  . Polyethylene Glycol 3350 (MIRALAX PO) Take by mouth.    . ranitidine (ZANTAC) 150 MG capsule Take 150 mg by mouth as needed for heartburn.     . vitamin B-12 (CYANOCOBALAMIN) 1000 MCG tablet Take 1,000 mcg by mouth daily.     No current facility-administered medications for this visit.     Allergies  Allergen Reactions  . Ezetimibe     REACTION: numbness  . Niacin     REACTION: CP  . Penicillins Other (See Comments)    Blisters between fingers @ 15  . Pravastatin Sodium Other (See Comments)    Aches and pains in joints  . Rosuvastatin     REACTION: aches    Review of Systems  negative except from HPI and PMH  Physical Exam BP 122/70   Pulse (!) 40   Ht 5\' 7"  (1.702 m)   Wt 155 lb 9.6 oz (70.6 kg)   SpO2 98%   BMI 24.37 kg/m  Well developed and well nourished in no acute distress HENT normal E scleral and icterus clear Neck Supple JVP flat; carotids brisk and full Clear to ausculation Regularly irRegular rate and rhythm, no murmurs gallops or rub Soft with active bowel sounds No clubbing cyanosis none Edema Alert and oriented, grossly normal motor and sensory function Skin Warm and Dry  Holter was reviewed and demonstrated 30% PVCs  ECG 11/27/16 DEMONSTRATED SINUS RHYTHM AT 77 INTERVALS 28/17/48 PVCS WITH A RIGHT BUNDLE SUPERIOR  AXIS MORPHOLOGY     Assessment and  Plan  PVCs-right bundle branch superior axis high burden as noted. In the absence of symptoms, intervention would be directed by impact on left ventricular function.   Cardiomyopathy ejection fraction 45% 2015 As above  History of aborted sudden cardiac death continue current meds  Implantable defibrillator-St. Jude The patient's device was interrogated.  The information was reviewed. No changes were made in the programming.    Ischemic cardiomyopathy with prior CABG continue current meds  Without symptoms of ischemia  First-degree AV block with right bundle branch block.  Carotid artery stenosis     Significant PVC  Burden may be contributing to dyspnea with either worsening cardiomyopathy or functional bradycardia. We'll begin him on amiodarone. I am little bit concerned given his conduction system disease.  This may end up requiring drug changes or device revision.  Without symptoms of ischemia but is possible that some of his exercises tolerance is also a manifestation of ischemia  He asks about carotid Doppler follow-up   I reviewed his last test done 07/2014. Biannual follow-up was recommended.

## 2016-12-24 NOTE — Patient Instructions (Addendum)
Medication Instructions: -Your physician has recommended you make the following change in your medication:  -1) START AMIODARONE 200 mg - For 2 weeks take 2 tablets (400 mg) by mouth twice daily. For the following 2 weeks take 2 tablets (400 mg) by mouth daily. Following that take 1 tablet (200 mg) by mouth daily.    Labwork: -Your physician recommends that you return for lab work in 1 month for LFT's and TSH (same day as device clinic office visit)    Procedures/Testing: -None Ordered  Follow-Up: -Your physician recommends that you schedule a follow-up appointment in: 1 Month with the [place] physician recommends that you schedule a follow-up appointment in: 3 MONTHS with Chanetta Marshall, NP        Any Additional Special Instructions Will Be Listed Below (If Applicable).     If you need a refill on your cardiac medications before your next appointment, please call your pharmacy.

## 2016-12-24 NOTE — Progress Notes (Signed)
Ok to hold plavix 

## 2016-12-24 NOTE — Telephone Encounter (Signed)
Lm on vm 

## 2016-12-25 NOTE — Telephone Encounter (Signed)
Patient expressed understanding regarding Plavix

## 2016-12-26 ENCOUNTER — Ambulatory Visit: Payer: Medicare Other | Admitting: Cardiovascular Disease

## 2017-01-02 ENCOUNTER — Encounter: Payer: Self-pay | Admitting: Internal Medicine

## 2017-01-02 ENCOUNTER — Telehealth: Payer: Self-pay | Admitting: Internal Medicine

## 2017-01-02 ENCOUNTER — Ambulatory Visit (AMBULATORY_SURGERY_CENTER): Payer: Medicare Other | Admitting: Internal Medicine

## 2017-01-02 VITALS — BP 161/61 | HR 58 | Temp 96.8°F | Resp 58

## 2017-01-02 DIAGNOSIS — Z951 Presence of aortocoronary bypass graft: Secondary | ICD-10-CM | POA: Diagnosis not present

## 2017-01-02 DIAGNOSIS — D120 Benign neoplasm of cecum: Secondary | ICD-10-CM

## 2017-01-02 DIAGNOSIS — I10 Essential (primary) hypertension: Secondary | ICD-10-CM | POA: Diagnosis not present

## 2017-01-02 DIAGNOSIS — K5900 Constipation, unspecified: Secondary | ICD-10-CM | POA: Diagnosis not present

## 2017-01-02 DIAGNOSIS — I2510 Atherosclerotic heart disease of native coronary artery without angina pectoris: Secondary | ICD-10-CM | POA: Diagnosis not present

## 2017-01-02 DIAGNOSIS — R194 Change in bowel habit: Secondary | ICD-10-CM | POA: Diagnosis not present

## 2017-01-02 DIAGNOSIS — D123 Benign neoplasm of transverse colon: Secondary | ICD-10-CM | POA: Diagnosis not present

## 2017-01-02 MED ORDER — HYDROCORTISONE ACETATE 25 MG RE SUPP: RECTAL | 6 refills | Status: DC

## 2017-01-02 MED ORDER — SODIUM CHLORIDE 0.9 % IV SOLN: 500.0000 mL | INTRAVENOUS | Status: DC

## 2017-01-02 NOTE — Progress Notes (Signed)
Report to PACU, RN, vss, BBS= Clear.  

## 2017-01-02 NOTE — Progress Notes (Signed)
Called to room to assist during endoscopic procedure.  Patient ID and intended procedure confirmed with present staff. Received instructions for my participation in the procedure from the performing physician.  

## 2017-01-02 NOTE — Patient Instructions (Signed)
YOU HAD AN ENDOSCOPIC PROCEDURE TODAY AT [facility]:   Refer to the procedure report that was given to you for any specific questions about what was found during the examination.  If the procedure report does not answer your questions, please call your gastroenterologist to clarify.  If you requested that your care partner not be given the details of your procedure findings, then the procedure report has been included in a sealed envelope for you to review at your convenience later.  YOU SHOULD EXPECT: Some feelings of bloating in the abdomen. Passage of more gas than usual.  Walking can help get rid of the air that was put into your GI tract during the procedure and reduce the bloating. If you had a lower endoscopy (such as a colonoscopy or flexible sigmoidoscopy) you may notice spotting of blood in your stool or on the toilet paper. If you underwent a bowel prep for your procedure, you may not have a normal bowel movement for a few days.  Please Note:  You might notice some irritation and congestion in your nose or some drainage.  This is from the oxygen used during your procedure.  There is no need for concern and it should clear up in a day or so.  SYMPTOMS TO REPORT IMMEDIATELY:   Following lower endoscopy (colonoscopy or flexible sigmoidoscopy):  Excessive amounts of blood in the stool  Significant tenderness or worsening of abdominal pains  Swelling of the abdomen that is new, acute  Fever of 100F or higher   For urgent or emergent issues, a gastroenterologist can be reached at any hour by calling [phone].   DIET:  We do recommend a small meal at first, but then you may proceed to your regular diet.  Drink plenty of fluids but you should avoid alcoholic beverages for 24 hours.  ACTIVITY:  You should plan to take it easy for the rest of today and you should NOT DRIVE or use heavy machinery until tomorrow (because of the sedation medicines used during the test).     FOLLOW UP: Our staff will call the number listed on your records the next business day following your procedure to check on you and address any questions or concerns that you may have regarding the information given to you following your procedure. If we do not reach you, we will leave a message.  However, if you are feeling well and you are not experiencing any problems, there is no need to return our call.  We will assume that you have returned to your regular daily activities without incident.  If any biopsies were taken you will be contacted by phone or by letter within the next 1-3 weeks.  Please call us at [phone] if you have not heard about the biopsies in 3 weeks.    Resume Plavix today Anusol suppository take 1 per rectal at bedtime as needed Await biopsy results Take Metamucil 1 heaping tablespoon daily Take Miralax 1 dose daily for constipation. You may increase or decrease the amount to achieve your desire effect. Diverticulosis (handout given) Hemorrhoids (handout given) Polyps (handout given)  SIGNATURES/CONFIDENTIALITY: You and/or your care partner have signed paperwork which will be entered into your electronic medical record.  These signatures attest to the fact that that the information above on your After Visit Summary has been reviewed and is understood.  Full responsibility of the confidentiality of this discharge information lies with you and/or your care-partner.

## 2017-01-02 NOTE — Progress Notes (Signed)
Last dose PLAVIX was Friday, 12-27-16.  Pt reported he took nitroglycerine last week, but not sure of the day.  Pt has pacemaker/defrib sign hanging on IV pole with anticoagulant therapy sign.  Pt denied any chest pain now. Maw  Pt questions if prior appendectomy has scar tissue that is causing the constipation.  Pt is have RLQ abdominal pain off and on.  While taking the prep, pt did notice the pain was worse.  maw

## 2017-01-02 NOTE — Op Note (Signed)
[facility] Patient Name: [name] Christensen Procedure Date: 01/02/2017 9:20 AM MRN: [id number] Endoscopist: Docia Chuck. Henrene Pastor , MD Age: 83 Referring MD:  Date of Birth: 1932-03-04 Gender: Male Account #: [id number] Procedure:                Colonoscopy with cold snare polypectomy x 2 Indications:              Lower abdominal pain, change in bowel habits noted,                            constipation Medicines:                Monitored Anesthesia Care Procedure:                Pre-Anesthesia Assessment:                           - Prior to the procedure, a History and Physical                            was performed, and patient medications and                            allergies were reviewed. The patient's tolerance of                            previous anesthesia was also reviewed. The risks                            and benefits of the procedure and the sedation                            options and risks were discussed with the patient.                            All questions were answered, and informed consent                            was obtained. Prior Anticoagulants: The patient has                            taken Plavix (clopidogrel), last dose was 7 days                            prior to procedure. ASA Grade Assessment: III - A                            patient with severe systemic disease. After                            reviewing the risks and benefits, the patient was                            deemed in satisfactory condition to undergo the  procedure.                           After obtaining informed consent, the colonoscope                            was passed under direct vision. Throughout the                            procedure, the patient's blood pressure, pulse, and                            oxygen saturations were monitored continuously. The                            Model CF-HQ190L [id number]) scope was introduced                             through the anus and advanced to the the cecum,                            identified by appendiceal orifice and ileocecal                            valve. The ileocecal valve, appendiceal orifice,                            and rectum were photographed. The quality of the                            bowel preparation was excellent. The colonoscopy                            was performed without difficulty. The patient                            tolerated the procedure well. The bowel preparation                            used was SUPREP. Scope In: 9:35:12 AM Scope Out: 9:49:45 AM Scope Withdrawal Time: 0 hours 10 minutes 37 seconds  Total Procedure Duration: 0 hours 14 minutes 33 seconds  Findings:                 Two polyps were found in the transverse colon and                            cecum. The polyps were 3 to 4 mm in size. These                            polyps were removed with a cold snare. Resection                            and retrieval were complete.  Multiple diverticula were found in the sigmoid                            colon.                           Internal hemorrhoids were found during                            retroflexion. The hemorrhoids were medium-sized.                           The exam was otherwise without abnormality on                            direct and retroflexion views. Complications:            No immediate complications. Estimated blood loss:                            None. Estimated Blood Loss:     Estimated blood loss: none. Impression:               - Two 3 to 4 mm polyps in the transverse colon and                            in the cecum, removed with a cold snare. Resected                            and retrieved.                           - Diverticulosis in the sigmoid colon.                           - Internal hemorrhoids.                           - The examination was  otherwise normal on direct                            and retroflexion views. Recommendation:           - Repeat colonoscopy is not recommended for                            surveillance.                           - Take MiraLAX 1 dose daily for constipation. You                            may increase decrease the amount to achieve the                            desired effect.                           -  Take Metamucil 1 heaping tablespoon daily                           - Prescribed Anusol HC medicated suppositories;                            #30; 6 refills; 1 PR daily at bedtime as needed.                           - Resume Plavix today.                           - Await pathology results. We will send you a                            letter with the results. Return to the care of your                            primary provider. Docia Chuck. Henrene Pastor, MD 01/02/2017 9:59:00 AM This report has been signed electronically.

## 2017-01-03 ENCOUNTER — Telehealth: Payer: Self-pay | Admitting: *Deleted

## 2017-01-03 NOTE — Telephone Encounter (Signed)
  Follow up Call-  Call back number 01/02/2017  Post procedure Call Back phone  # [phone] hm  Permission to leave phone message Yes  Some recent data might be hidden     Patient questions:  Do you have a fever, pain , or abdominal swelling? No. Pain Score  0 *  Have you tolerated food without any problems? Yes.    Have you been able to return to your normal activities? Yes.    Do you have any questions about your discharge instructions: Diet   No. Medications  No. Follow up visit  Yes.    Do you have questions or concerns about your Care? No.  Actions: * If pain score is 4 or above: No action needed, pain <4.pt did mention he is still somewhat "gassy" and bloated. Encouraged pt to drink warm liquids and try not to eat gas forming foods today and to call us back if doesn't think he improves.

## 2017-01-08 ENCOUNTER — Encounter: Payer: Self-pay | Admitting: Internal Medicine

## 2017-01-08 NOTE — Telephone Encounter (Signed)
I spoke with the pharmacy to clarify their message regarding patient's suppositories and they said that they can compound that same kind of suppository but at 20mg  instead of 25mg , and it would only be $3 a suppository.  Is this an option at all.?  I don't think he can afford the regular anusol suppositories

## 2017-01-09 ENCOUNTER — Telehealth: Payer: Self-pay

## 2017-01-09 NOTE — Telephone Encounter (Signed)
20mg  compounded suppositories filled

## 2017-01-09 NOTE — Telephone Encounter (Signed)
Sure.  That would be fine

## 2017-01-09 NOTE — Telephone Encounter (Signed)
Spoke with [name] Christensen at [facility] and told her to go ahead and prepare the same quantity of the 20mg  compounded suppositories.  She agreed

## 2017-01-13 ENCOUNTER — Encounter: Payer: Self-pay | Admitting: Nurse Practitioner

## 2017-01-14 ENCOUNTER — Encounter: Payer: Medicare Other | Admitting: Internal Medicine

## 2017-01-22 ENCOUNTER — Ambulatory Visit (INDEPENDENT_AMBULATORY_CARE_PROVIDER_SITE_OTHER): Payer: Medicare Other | Admitting: *Deleted

## 2017-01-22 ENCOUNTER — Other Ambulatory Visit (INDEPENDENT_AMBULATORY_CARE_PROVIDER_SITE_OTHER): Payer: Medicare Other

## 2017-01-22 ENCOUNTER — Other Ambulatory Visit: Payer: Medicare Other | Admitting: *Deleted

## 2017-01-22 DIAGNOSIS — I2510 Atherosclerotic heart disease of native coronary artery without angina pectoris: Secondary | ICD-10-CM

## 2017-01-22 DIAGNOSIS — I10 Essential (primary) hypertension: Secondary | ICD-10-CM | POA: Diagnosis not present

## 2017-01-22 DIAGNOSIS — I255 Ischemic cardiomyopathy: Secondary | ICD-10-CM

## 2017-01-22 DIAGNOSIS — I493 Ventricular premature depolarization: Secondary | ICD-10-CM

## 2017-01-22 DIAGNOSIS — I480 Paroxysmal atrial fibrillation: Secondary | ICD-10-CM | POA: Diagnosis not present

## 2017-01-22 DIAGNOSIS — Z95810 Presence of automatic (implantable) cardiac defibrillator: Secondary | ICD-10-CM

## 2017-01-22 LAB — BASIC METABOLIC PANEL
BUN: 13 mg/dL (ref 6–23)
CO2: 27 mEq/L (ref 19–32)
Calcium: 10.4 mg/dL (ref 8.4–10.5)
Chloride: 98 mEq/L (ref 96–112)
Creatinine, Ser: 1.19 mg/dL (ref 0.40–1.50)
GFR: 62.16 mL/min (ref >60.00)
Glucose, Bld: 90 mg/dL (ref 70–99)
Potassium: 4.9 mEq/L (ref 3.5–5.1)
Sodium: 130 mEq/L — ABNORMAL LOW (ref 135–145)

## 2017-01-22 LAB — CUP PACEART INCLINIC DEVICE CHECK
Brady Statistic RV Percent Paced: 19 %
Date Time Interrogation Session: 20180228104631
HighPow Impedance: 64.125
Implantable Lead Implant Date: 20131218
Implantable Lead Location: 753860
Implantable Pulse Generator Implant Date: 20131218
Lead Channel Impedance Value: 412.5 Ohm
Lead Channel Setting Pacing Amplitude: 2.5 V
Lead Channel Setting Pacing Pulse Width: 0.8 ms
Lead Channel Setting Sensing Sensitivity: 0.5 mV
Pulse Gen Serial Number: 7048849

## 2017-01-22 LAB — HEPATIC FUNCTION PANEL
ALT: 24 IU/L (ref 0–44)
AST: 28 IU/L (ref 0–40)
Albumin: 4.1 g/dL (ref 3.5–4.7)
Alkaline Phosphatase: 143 IU/L — ABNORMAL HIGH (ref 39–117)
Bilirubin Total: 1.2 mg/dL (ref 0.0–1.2)
Bilirubin, Direct: 0.55 mg/dL — ABNORMAL HIGH (ref 0.00–0.40)
Total Protein: 6.9 g/dL (ref 6.0–8.5)

## 2017-01-22 LAB — CBC WITH DIFFERENTIAL/PLATELET
Basophils Absolute: 0 10*3/uL (ref 0.0–0.1)
Basophils Relative: 0.6 % (ref 0.0–3.0)
Eosinophils Absolute: 0.1 10*3/uL (ref 0.0–0.7)
Eosinophils Relative: 1.7 % (ref 0.0–5.0)
HCT: 37.4 % — ABNORMAL LOW (ref 39.0–52.0)
Hemoglobin: 12.6 g/dL — ABNORMAL LOW (ref 13.0–17.0)
Lymphocytes Relative: 18.4 % (ref 12.0–46.0)
Lymphs Abs: 1.3 10*3/uL (ref 0.7–4.0)
MCHC: 33.8 g/dL (ref 30.0–36.0)
MCV: 91.5 fl (ref 78.0–100.0)
Monocytes Absolute: 0.6 10*3/uL (ref 0.1–1.0)
Monocytes Relative: 7.9 % (ref 3.0–12.0)
Neutro Abs: 5 10*3/uL (ref 1.4–7.7)
Neutrophils Relative %: 71.4 % (ref 43.0–77.0)
Platelets: 96 10*3/uL — ABNORMAL LOW (ref 150.0–400.0)
RBC: 4.09 Mil/uL — ABNORMAL LOW (ref 4.22–5.81)
RDW: 14.2 % (ref 11.5–15.5)
WBC: 6.9 10*3/uL (ref 4.0–10.5)

## 2017-01-22 LAB — TSH: TSH: 3.25 u[IU]/mL (ref 0.450–4.500)

## 2017-01-22 NOTE — Progress Notes (Signed)
ICD check in clinic to reset counters/clear diagnostics. No testing performed. Histograms indicate increased bradycardia since pt was started on amiodarone on 12/24/16 (Vp 19%). Pt reports ongoing dyspnea and fatigue, does not feel dyspnea is worse since recent appointment with [facility], also reports cold symptoms at this time. Thoracic impedance stable. Encouraged patient to call our office if symptoms worsen, or after cold symptoms resolve if dyspnea and fatigue persist. Patient agreeable to this plan. ROV with AS on 03/28/17.

## 2017-01-27 ENCOUNTER — Telehealth: Payer: Self-pay | Admitting: Internal Medicine

## 2017-01-27 DIAGNOSIS — R748 Abnormal levels of other serum enzymes: Secondary | ICD-10-CM

## 2017-01-27 NOTE — Telephone Encounter (Signed)
New Message   Pt states he was called and someone told him to call the nurse back. He is just returning the phone call.

## 2017-01-27 NOTE — Telephone Encounter (Signed)
Reviewed lab results with patient and advised him of elevated alkaline phosphatase level and need for recheck in 4 weeks. He is scheduled for repeat lab work on 4/3. He verbalized understanding and agreement with plan and thanked me for the call.

## 2017-01-28 ENCOUNTER — Encounter: Payer: Medicare Other | Admitting: Internal Medicine

## 2017-01-28 ENCOUNTER — Telehealth: Payer: Self-pay | Admitting: *Deleted

## 2017-01-28 NOTE — Telephone Encounter (Signed)
LMOM requesting call back to the [facility].  Will determine if patient is still having symptoms of dyspnea and fatigue.  Will schedule for sooner appointment with Chanetta Marshall, NP, to discuss plan regarding amiodarone and bradycardia.

## 2017-01-28 NOTE — Telephone Encounter (Signed)
Patient returned call.  Advised patient that I reviewed his device check data with Dr. [name], who recommended that [name] Christensen f/u with EP APP sooner than May.  Patient reports that he still feels run down, but he is also still experiencing cold symptoms.  He reports symptoms are not worsening, but are not much improved either.  Advised patient of Dr. Olin Pia recommendations.  Patient is agreeable to appointment with Chanetta Marshall, NP on 02/13/17 (next available opening), but is agreeable to calling the [facility] if he feels his symptoms are worsening in the interim.  Patient is appreciative of call and denies additional questions or concerns at this time.

## 2017-02-03 ENCOUNTER — Other Ambulatory Visit: Payer: Self-pay | Admitting: Internal Medicine

## 2017-02-05 ENCOUNTER — Telehealth: Payer: Self-pay | Admitting: Internal Medicine

## 2017-02-05 ENCOUNTER — Encounter: Payer: Self-pay | Admitting: Nurse Practitioner

## 2017-02-05 NOTE — Telephone Encounter (Signed)
Called patient to schedule awv. Lvm for patient to call office to schedule appt.  °

## 2017-02-06 ENCOUNTER — Ambulatory Visit (INDEPENDENT_AMBULATORY_CARE_PROVIDER_SITE_OTHER): Payer: Medicare Other | Admitting: Internal Medicine

## 2017-02-06 ENCOUNTER — Other Ambulatory Visit (INDEPENDENT_AMBULATORY_CARE_PROVIDER_SITE_OTHER): Payer: Medicare Other

## 2017-02-06 ENCOUNTER — Encounter: Payer: Self-pay | Admitting: Internal Medicine

## 2017-02-06 VITALS — BP 148/40 | HR 52 | Temp 97.9°F | Resp 16 | Ht 67.0 in | Wt 161.0 lb

## 2017-02-06 DIAGNOSIS — K642 Third degree hemorrhoids: Secondary | ICD-10-CM

## 2017-02-06 DIAGNOSIS — E785 Hyperlipidemia, unspecified: Secondary | ICD-10-CM

## 2017-02-06 DIAGNOSIS — I10 Essential (primary) hypertension: Secondary | ICD-10-CM

## 2017-02-06 DIAGNOSIS — R531 Weakness: Secondary | ICD-10-CM | POA: Diagnosis not present

## 2017-02-06 DIAGNOSIS — I255 Ischemic cardiomyopathy: Secondary | ICD-10-CM

## 2017-02-06 DIAGNOSIS — I2510 Atherosclerotic heart disease of native coronary artery without angina pectoris: Secondary | ICD-10-CM

## 2017-02-06 DIAGNOSIS — K5900 Constipation, unspecified: Secondary | ICD-10-CM

## 2017-02-06 DIAGNOSIS — K649 Unspecified hemorrhoids: Secondary | ICD-10-CM | POA: Insufficient documentation

## 2017-02-06 DIAGNOSIS — D649 Anemia, unspecified: Secondary | ICD-10-CM

## 2017-02-06 DIAGNOSIS — R202 Paresthesia of skin: Secondary | ICD-10-CM

## 2017-02-06 DIAGNOSIS — R7989 Other specified abnormal findings of blood chemistry: Secondary | ICD-10-CM | POA: Diagnosis not present

## 2017-02-06 DIAGNOSIS — R945 Abnormal results of liver function studies: Secondary | ICD-10-CM

## 2017-02-06 LAB — MAGNESIUM: Magnesium: 1.9 mg/dL (ref 1.5–2.5)

## 2017-02-06 LAB — BASIC METABOLIC PANEL
BUN: 13 mg/dL (ref 6–23)
CO2: 28 mEq/L (ref 19–32)
Calcium: 10.4 mg/dL (ref 8.4–10.5)
Chloride: 94 mEq/L — ABNORMAL LOW (ref 96–112)
Creatinine, Ser: 1.12 mg/dL (ref 0.40–1.50)
GFR: 66.66 mL/min (ref >60.00)
Glucose, Bld: 100 mg/dL — ABNORMAL HIGH (ref 70–99)
Potassium: 4.7 mEq/L (ref 3.5–5.1)
Sodium: 127 mEq/L — ABNORMAL LOW (ref 135–145)

## 2017-02-06 LAB — HEPATIC FUNCTION PANEL
ALT: 22 U/L (ref 0–53)
AST: 28 U/L (ref 0–37)
Albumin: 3.8 g/dL (ref 3.5–5.2)
Alkaline Phosphatase: 165 U/L — ABNORMAL HIGH (ref 39–117)
Bilirubin, Direct: 0.4 mg/dL — ABNORMAL HIGH (ref 0.0–0.3)
Total Bilirubin: 0.9 mg/dL (ref 0.2–1.2)
Total Protein: 6.9 g/dL (ref 6.0–8.3)

## 2017-02-06 LAB — IBC PANEL
Iron: 43 ug/dL (ref 42–165)
Saturation Ratios: 10.5 % — ABNORMAL LOW (ref 20.0–50.0)
Transferrin: 293 mg/dL (ref 212.0–360.0)

## 2017-02-06 LAB — VITAMIN B12: Vitamin B-12: 1268 pg/mL — ABNORMAL HIGH (ref 211–911)

## 2017-02-06 MED ORDER — HYDROCORTISONE 2.5 % RE CREA: TOPICAL_CREAM | RECTAL | 1 refills | Status: DC

## 2017-02-06 NOTE — Assessment & Plan Note (Signed)
Lipitor 

## 2017-02-06 NOTE — Assessment & Plan Note (Signed)
Atenolol, ASA, Plavix, Lipitor, Losartan Labs

## 2017-02-06 NOTE — Progress Notes (Signed)
Subjective:  Patient ID: [name] Christensen, male    DOB: 1932/07/07  Age: 83 y.o. MRN: [id number]  CC: Follow-up (HTN- Alkaline is high)   HPI ACER BALTZ presents for HTN, CAD, BPH, B12 def f/u Not feeling too well after colonoscopy 01/02/17. C/o HAs, fatigue. C/o sinus d/c x3 wks, yellow... c/o constipation    Outpatient Medications Prior to Visit  Medication Sig Dispense Refill  . alfuzosin (UROXATRAL) 10 MG 24 hr tablet Take 10 mg by mouth every evening.     [name] amiodarone (PACERONE) 200 MG tablet Take one tablet (200 mg) by mouth once daily 30 tablet 6  . aspirin 81 MG chewable tablet Chew 1 tablet (81 mg total) by mouth daily.    [name] atenolol (TENORMIN) 50 MG tablet TAKE ONE (1) TABLET BY MOUTH EVERY DAY 90 tablet 2  . atorvastatin (LIPITOR) 20 MG tablet Take 1 tablet (20 mg total) by mouth daily at 6 PM. 90 tablet 3  . betamethasone dipropionate (DIPROLENE) 0.05 % cream Apply topically 2 (two) times daily. 30 g 3  . Cholecalciferol (VITAMIN D3) 1000 UNITS CAPS Take 1 capsule by mouth daily.      . clopidogrel (PLAVIX) 75 MG tablet TAKE ONE (1) TABLET BY MOUTH EVERY DAY 30 tablet 5  . clotrimazole-betamethasone (LOTRISONE) cream Apply 1 application topically 2 (two) times daily. 45 g 3  . diazepam (VALIUM) 5 MG tablet Take 0.5-1 tablets (2.5-5 mg total) by mouth every 12 (twelve) hours as needed for anxiety. 30 tablet 2  . dutasteride (AVODART) 0.5 MG capsule Take 0.5 mg by mouth daily.    . hydrocortisone (ANUSOL-HC) 25 MG suppository Use 1 rectal  At bedtime as needed 30 suppository 6  . linaclotide (LINZESS) 290 MCG CAPS capsule Take 1 capsule (290 mcg total) by mouth daily. 30 capsule 11  . losartan (COZAAR) 50 MG tablet Take 1 tablet (50 mg total) by mouth daily. 90 tablet 3  . Multiple Vitamin (MULTIVITAMIN WITH MINERALS) TABS Take 1 tablet by mouth daily.    . nitroGLYCERIN (NITROSTAT) 0.4 MG SL tablet PLACE ONE TABLET UNDER TONGUE EVERY 5 MINUTES AS NEEDED FOR CHEST PAIN (3  DOSES MAX) 25 tablet 0  . Polyethylene Glycol 3350 (MIRALAX PO) Take by mouth.    . ranitidine (ZANTAC) 150 MG capsule Take 150 mg by mouth as needed for heartburn.     . vitamin B-12 (CYANOCOBALAMIN) 1000 MCG tablet Take 1,000 mcg by mouth daily.     Facility-Administered Medications Prior to Visit  Medication Dose Route Frequency Provider Last Rate Last Dose  . 0.9 %  sodium chloride infusion  500 mL Intravenous Continuous Hilarie Fredrickson, MD        ROS Review of Systems  Constitutional: Positive for fatigue. Negative for appetite change and unexpected weight change.  HENT: Positive for congestion and sinus pressure. Negative for nosebleeds, sneezing, sore throat and trouble swallowing.   Eyes: Negative for itching and visual disturbance.  Respiratory: Negative for cough.   Cardiovascular: Negative for chest pain, palpitations and leg swelling.  Gastrointestinal: Negative for abdominal distention, blood in stool, diarrhea and nausea.  Genitourinary: Negative for frequency and hematuria.  Musculoskeletal: Negative for back pain, gait problem, joint swelling and neck pain.  Skin: Negative for rash.  Neurological: Negative for dizziness, tremors, speech difficulty and weakness.  Psychiatric/Behavioral: Negative for agitation, dysphoric mood and sleep disturbance. The patient is not nervous/anxious.     Objective:  BP (!) 148/40   Pulse [name])  52   Temp 97.9 F (36.6 C) (Oral)   Resp 16   Ht 5\' 7"  (1.702 m)   Wt 161 lb (73 kg)   SpO2 97%   BMI 25.22 kg/m   BP Readings from Last 3 Encounters:  02/06/17 (!) 148/40  01/02/17 (!) 161/61  12/24/16 122/70    Wt Readings from Last 3 Encounters:  02/06/17 161 lb (73 kg)  12/24/16 155 lb 9.6 oz (70.6 kg)  12/23/16 155 lb 2 oz (70.4 kg)    Physical Exam  Constitutional: He is oriented to person, place, and time. He appears well-developed. No distress.  NAD  HENT:  Mouth/Throat: Oropharynx is clear and moist.  Eyes: Conjunctivae  are normal. Pupils are equal, round, and reactive to light.  Neck: Normal range of motion. No JVD present. No thyromegaly present.  Cardiovascular: Normal rate, regular rhythm, normal heart sounds and intact distal pulses.  Exam reveals no gallop and no friction rub.   No murmur heard. Pulmonary/Chest: Effort normal and breath sounds normal. No respiratory distress. He has no wheezes. He has no rales. He exhibits no tenderness.  Abdominal: Soft. Bowel sounds are normal. He exhibits no distension and no mass. There is no tenderness. There is no rebound and no guarding.  Musculoskeletal: Normal range of motion. He exhibits no edema or tenderness.  Lymphadenopathy:    He has no cervical adenopathy.  Neurological: He is alert and oriented to person, place, and time. He has normal reflexes. No cranial nerve deficit. He exhibits normal muscle tone. He displays a negative Romberg sign. Coordination and gait normal.  Skin: Skin is warm and dry. No rash noted.  Psychiatric: He has a normal mood and affect. His behavior is normal. Judgment and thought content normal.  bradycardic abd is a little bloated, epig - sensitive  Lab Results  Component Value Date   WBC 6.9 01/22/2017   HGB 12.6 (L) 01/22/2017   HCT 37.4 (L) 01/22/2017   PLT 96.0 (L) 01/22/2017   GLUCOSE 90 01/22/2017   CHOL 118 09/11/2016   TRIG 62.0 09/11/2016   HDL 49.60 09/11/2016   LDLCALC 56 09/11/2016   ALT 24 01/22/2017   AST 28 01/22/2017   NA 130 (L) 01/22/2017   K 4.9 01/22/2017   CL 98 01/22/2017   CREATININE 1.19 01/22/2017   BUN 13 01/22/2017   CO2 27 01/22/2017   TSH 3.250 01/22/2017   PSA 12.45 (H) 02/06/2016   INR 1.18 05/27/2015   HGBA1C 6.3 (H) 10/27/2012    No results found.  Assessment & Plan:   There are no diagnoses linked to this encounter. I am having Mr. Gauna maintain his Vitamin D3, aspirin, alfuzosin, multivitamin with minerals, ranitidine, betamethasone dipropionate, vitamin B-12,  clotrimazole-betamethasone, atorvastatin, losartan, linaclotide, clopidogrel, diazepam, atenolol, dutasteride, Polyethylene Glycol 3350 (MIRALAX PO), amiodarone, hydrocortisone, and nitroGLYCERIN. We will continue to administer sodium chloride.  No orders of the defined types were placed in this encounter.    Follow-up: No Follow-up on file.  [name], MD

## 2017-02-06 NOTE — Assessment & Plan Note (Signed)
Linzess 

## 2017-02-06 NOTE — Assessment & Plan Note (Signed)
Bradycardic - will reduce Atenolol

## 2017-02-06 NOTE — Patient Instructions (Signed)
Take Linzess Reduce Atenolol to 1/2 tab a day

## 2017-02-06 NOTE — Assessment & Plan Note (Signed)
Losartan, Atenolol 

## 2017-02-06 NOTE — Progress Notes (Signed)
Pre-visit discussion using our clinic review tool. No additional management support is needed unless otherwise documented below in the visit note.  

## 2017-02-06 NOTE — Assessment & Plan Note (Signed)
?  Rubber banding Anusol HC

## 2017-02-07 ENCOUNTER — Telehealth: Payer: Self-pay | Admitting: Nurse Practitioner

## 2017-02-07 NOTE — Telephone Encounter (Signed)
New Message:    Pt wants to know if he needs lab work before his appt next Thursday(02-13-17)?

## 2017-02-11 NOTE — Telephone Encounter (Signed)
Follow up    Pt wants to know if he needs to fast for labs on Thursday?

## 2017-02-12 ENCOUNTER — Other Ambulatory Visit: Payer: Medicare Other | Admitting: *Deleted

## 2017-02-12 ENCOUNTER — Ambulatory Visit
Admission: RE | Admit: 2017-02-12 | Discharge: 2017-02-12 | Disposition: A | Discharge status: Home / Self Care | Payer: Medicare Other | Source: Ambulatory Visit | Attending: Internal Medicine | Admitting: Internal Medicine

## 2017-02-12 DIAGNOSIS — R748 Abnormal levels of other serum enzymes: Secondary | ICD-10-CM | POA: Diagnosis not present

## 2017-02-12 DIAGNOSIS — R945 Abnormal results of liver function studies: Secondary | ICD-10-CM | POA: Diagnosis not present

## 2017-02-12 LAB — HEPATIC FUNCTION PANEL
ALT: 24 IU/L (ref 0–44)
AST: 31 IU/L (ref 0–40)
Albumin: 4.1 g/dL (ref 3.5–4.7)
Alkaline Phosphatase: 216 IU/L — ABNORMAL HIGH (ref 39–117)
Bilirubin Total: 1.2 mg/dL (ref 0.0–1.2)
Bilirubin, Direct: 0.65 mg/dL — ABNORMAL HIGH (ref 0.00–0.40)
Total Protein: 7.1 g/dL (ref 6.0–8.5)

## 2017-02-12 NOTE — Progress Notes (Signed)
Electrophysiology Office Note Date: 02/13/2017  ID:  [name], Christensen 07/07/1932, MRN [id number]  PCP: [name], MD Primary Cardiologist: [name] Electrophysiologist: Graciela Husbands   CC: bradycardia follow up  [name] Christensen is a 83 y.o. male seen today for Dr Graciela Husbands.  He was recently started on amiodarone for increased PVC burden and has had resultant bradycardia. He presents today for follow up.   Since last being seen in our clinic, the patient reports feeling improved since he was able to relieve his constipation about 2 weeks ago. At that point, his shortness of breath and abdominal fullness significantly improved. He denies chest pain, palpitations, dyspnea, PND, orthopnea, nausea, vomiting, dizziness, syncope, edema, weight gain, or early satiety.  He has not had ICD shocks.   Device History: STJ single chamber ICD implanted 2013 for cardiac arrest History of appropriate therapy: No History of AAD therapy: Yes - amiodarone for PVC's    Past Medical History:  Diagnosis Date  . Allergy   . BPH (benign prostatic hypertrophy)    elevated PSA Dr. Lindell Noe Bx 2010  . Cardiac arrest [facility])    a. out-of-hospital arrest 11/24/2012 - EF 40-45%, patent grafts on cath, received [employer] AICD.  [name] Carotid disease, bilateral (HCC)    a. 0-39% by doppers.  . Cataract    bil cataracts removed  . CHF (congestive heart failure) (HCC)   . Coronary artery disease    a. s/p MI/CABG 2005. b. s/p cath at time of VF arrest 10/2013 - grafts patent.  . Elevated LFTs    a. 10/2012 felt due to cardiac arrest - Hepatitis C Ab reactive from 10/27/2012>>Hep C RNA PCR negative 11/24/2012.   [name] GERD (gastroesophageal reflux disease)   . Hyperlipidemia   . Hypertension   . Myocardial infarction    2005 - CABG x 5 2005  . RBBB   . Ventricular bigeminy    a. Event monitor 01/2013: NSR with PVCs and occ bigeminy.   Past Surgical History:  Procedure Laterality Date  . APPENDECTOMY    . CARDIAC  CATHETERIZATION  2007   with patent graft anatomy atretic left internal mammary  artery to the LAD which is nonobstructive. Will restart study June 08, 2007  . CATARACT EXTRACTION W/PHACO Right 04/23/2016   Procedure: CATARACT EXTRACTION PHACO AND INTRAOCULAR LENS PLACEMENT (IOC);  Surgeon: [name], MD;  Location: [facility] ORS;  Service: Ophthalmology;  Laterality: Right;  [place] 1.09 AP% 19.3 CDE 13.38 Fluid pack lot # [id number]  . COLONOSCOPY    . CORONARY ARTERY BYPASS GRAFT  2005  . EYE SURGERY    . IMPLANTABLE CARDIOVERTER DEFIBRILLATOR IMPLANT N/A 11/14/2012   STJ single chamber ICD implanted by Dr Graciela Husbands for cardiac arrest   . INGUINAL HERNIA REPAIR Bilateral 01/17/2015   Procedure: BILATERAL LAPAROSCOPIC INGUINAL HERNIA REPAIR WITH LEFT FEMORAL HERNIA REPAIR;  Surgeon: [name], MD;  Location: [facility] OR;  Service: General;  Laterality: Bilateral;  . INSERTION OF MESH Bilateral 01/17/2015   Procedure: INSERTION OF MESH;  Surgeon: [name], MD;  Location[facility] OR;  Service: General;  Laterality: Bilateral;  . LEFT HEART CATHETERIZATION WITH CORONARY/GRAFT ANGIOGRAM N/A 11/13/2012   Procedure: LEFT HEART CATHETERIZATION WITH Isabel Caprice;  Surgeon: [name], MD;  Location: [facility] CATH LAB;  Service: Cardiovascular;  Laterality: N/A;  . LUMBAR FUSION  09/2007    Current Outpatient Prescriptions  Medication Sig Dispense Refill  . alfuzosin (UROXATRAL) 10 MG 24 hr tablet Take 10 mg by  mouth every evening.     [name] amiodarone (PACERONE) 200 MG tablet Take one tablet (200 mg) by mouth once daily 30 tablet 6  . aspirin 81 MG chewable tablet Chew 1 tablet (81 mg total) by mouth daily.    [name] atenolol (TENORMIN) 50 MG tablet TAKE ONE (1) TABLET BY MOUTH EVERY DAY 90 tablet 2  . atorvastatin (LIPITOR) 20 MG tablet Take 1 tablet (20 mg total) by mouth daily at 6 PM. 90 tablet 3  . betamethasone dipropionate (DIPROLENE) 0.05 % cream Apply topically 2 (two) times daily. 30 g 3  .  Cholecalciferol (VITAMIN D3) 1000 UNITS CAPS Take 1 capsule by mouth daily.      . clopidogrel (PLAVIX) 75 MG tablet TAKE ONE (1) TABLET BY MOUTH EVERY DAY 30 tablet 5  . clotrimazole-betamethasone (LOTRISONE) cream Apply 1 application topically 2 (two) times daily. 45 g 3  . diazepam (VALIUM) 5 MG tablet Take 0.5-1 tablets (2.5-5 mg total) by mouth every 12 (twelve) hours as needed for anxiety. 30 tablet 2  . dutasteride (AVODART) 0.5 MG capsule Take 0.5 mg by mouth daily.    . hydrocortisone (ANUSOL-HC) 2.5 % rectal cream bid 30 g 1  . hydrocortisone (ANUSOL-HC) 25 MG suppository Use 1 rectal  At bedtime as needed 30 suppository 6  . linaclotide (LINZESS) 290 MCG CAPS capsule Take 1 capsule (290 mcg total) by mouth daily. 30 capsule 11  . losartan (COZAAR) 50 MG tablet Take 1 tablet (50 mg total) by mouth daily. 90 tablet 3  . Multiple Vitamin (MULTIVITAMIN WITH MINERALS) TABS Take 1 tablet by mouth daily.    . nitroGLYCERIN (NITROSTAT) 0.4 MG SL tablet PLACE ONE TABLET UNDER TONGUE EVERY 5 MINUTES AS NEEDED FOR CHEST PAIN (3 DOSES MAX) 25 tablet 0  . Polyethylene Glycol 3350 (MIRALAX PO) Take by mouth.    . ranitidine (ZANTAC) 150 MG capsule Take 150 mg by mouth as needed for heartburn.     . vitamin B-12 (CYANOCOBALAMIN) 1000 MCG tablet Take 1,000 mcg by mouth daily.     Current Facility-Administered Medications  Medication Dose Route Frequency Provider Last Rate Last Dose  . 0.9 %  sodium chloride infusion  500 mL Intravenous Continuous Hilarie Fredrickson, MD        Allergies:   Ezetimibe; Niacin; Penicillins; Pravastatin sodium; and Rosuvastatin   Social History: Social History   Social History  . Marital status: Married    Spouse name: N/A  . Number of children: 5  . Years of education: N/A   Occupational History  . Realtor [employer]   Social History Main Topics  . Smoking status: Never Smoker  . Smokeless tobacco: Never Used  . Alcohol use No  . Drug use: No  . Sexual  activity: Not on file   Other Topics Concern  . Not on file   Social History Narrative  . No narrative on file    Family History: Family History  Problem Relation Age of Onset  . Coronary artery disease Mother   . Heart attack Brother   . Stomach cancer Neg Hx   . Colon cancer Neg Hx   . Esophageal cancer Neg Hx   . Pancreatic cancer Neg Hx   . Prostate cancer Neg Hx   . Rectal cancer Neg Hx     Review of Systems: All other systems reviewed and are otherwise negative except as noted above.   Physical Exam: VS:  BP (!) 142/66   Pulse [name])  58   Ht 5\' 7"  (1.702 m)   Wt 153 lb (69.4 kg)   BMI 23.96 kg/m  , BMI Body mass index is 23.96 kg/m.  GEN- The patient is elderly appearing, alert and oriented x 3 today.   HEENT: normocephalic, atraumatic; sclera clear, conjunctiva pink; hearing intact; oropharynx clear; neck supple  Lungs- Clear to ausculation bilaterally, normal work of breathing.  No wheezes, rales, rhonchi Heart- Regular rate and rhythm with frequent ectopy  GI- soft, non-tender, non-distended, bowel sounds present  Extremities- no clubbing, cyanosis, or edema  MS- no significant deformity or atrophy Skin- warm and dry, no rash or lesion; ICD pocket well healed Psych- euthymic mood, full affect Neuro- strength and sensation are intact  ICD interrogation- reviewed in detail today,  See PACEART report  EKG:  EKG is not ordered today.  Recent Labs: 01/22/2017: Hemoglobin 12.6; Platelets 96.0; TSH 3.250 02/06/2017: BUN 13; Creatinine, Ser 1.12; Magnesium 1.9; Potassium 4.7; Sodium 127 02/12/2017: ALT 24   Wt Readings from Last 3 Encounters:  02/13/17 153 lb (69.4 kg)  02/06/17 161 lb (73 kg)  12/24/16 155 lb 9.6 oz (70.6 kg)     Other studies Reviewed: Additional studies/ records that were reviewed today include: Dr Odessa Fleming office notes   Assessment and Plan:  1.  Cardiac arrest  Normal ICD function See Pace Art report No changes today  2. Chronic  systolic dysfunction euvolemic today Stable on an appropriate medical regimen  3.  PVC's Burden by device interrogation today unable to be determined  Continue amiodarone TSH, LFT's recently normal   4.  Sinus bradycardia/RBBB/1st degree AV block RV pacing 23% by device interrogation today He is significantly improved by symptoms in the last couple of weeks For now, will follow We discussed possibility of device upgrade today. He would like to follow for now.    Current medicines are reviewed at length with the patient today.   The patient does not have concerns regarding his medicines.  The following changes were made today:  none  Labs/ tests ordered today include: none No orders of the defined types were placed in this encounter.    Disposition:   Follow up with me as scheduled in may      Signed, Gypsy Balsam, NP 02/13/2017 10:31 AM  [facility] [address] [place] [address] [phone] (office) [phone] (fax

## 2017-02-13 ENCOUNTER — Ambulatory Visit (INDEPENDENT_AMBULATORY_CARE_PROVIDER_SITE_OTHER): Payer: Medicare Other | Admitting: Nurse Practitioner

## 2017-02-13 ENCOUNTER — Encounter: Payer: Medicare Other | Admitting: Nurse Practitioner

## 2017-02-13 ENCOUNTER — Encounter: Payer: Self-pay | Admitting: Nurse Practitioner

## 2017-02-13 ENCOUNTER — Other Ambulatory Visit: Payer: Medicare Other

## 2017-02-13 VITALS — BP 142/66 | HR 58 | Ht 67.0 in | Wt 153.0 lb

## 2017-02-13 DIAGNOSIS — I5022 Chronic systolic (congestive) heart failure: Secondary | ICD-10-CM

## 2017-02-13 DIAGNOSIS — I493 Ventricular premature depolarization: Secondary | ICD-10-CM | POA: Diagnosis not present

## 2017-02-13 DIAGNOSIS — I469 Cardiac arrest, cause unspecified: Secondary | ICD-10-CM

## 2017-02-13 DIAGNOSIS — I4510 Unspecified right bundle-branch block: Secondary | ICD-10-CM | POA: Diagnosis not present

## 2017-02-13 DIAGNOSIS — I255 Ischemic cardiomyopathy: Secondary | ICD-10-CM

## 2017-02-13 DIAGNOSIS — R001 Bradycardia, unspecified: Secondary | ICD-10-CM | POA: Diagnosis not present

## 2017-02-13 LAB — CUP PACEART INCLINIC DEVICE CHECK
Date Time Interrogation Session: 20180322125031
Implantable Lead Implant Date: 20131218
Implantable Lead Location: 753860
Implantable Pulse Generator Implant Date: 20131218
Pulse Gen Serial Number: 7048849

## 2017-02-13 NOTE — Patient Instructions (Signed)
Medication Instructions:    Your physician recommends that you continue on your current medications as directed. Please refer to the Current Medication list given to you today.   If you need a refill on your cardiac medications before your next appointment, please call your pharmacy.  Labwork: Your physician recommends that you continue on your current medications as directed. Please refer to the Current Medication list given to you today.    Testing/Procedures: NONE ORDERED  TODAY    Follow-Up:  AS SCHEDULED    Any Other Special Instructions Will Be Listed Below (If Applicable).

## 2017-02-24 ENCOUNTER — Encounter: Payer: Self-pay | Admitting: Internal Medicine

## 2017-02-24 ENCOUNTER — Ambulatory Visit (INDEPENDENT_AMBULATORY_CARE_PROVIDER_SITE_OTHER): Payer: Medicare Other | Admitting: Internal Medicine

## 2017-02-24 DIAGNOSIS — R05 Cough: Secondary | ICD-10-CM | POA: Diagnosis not present

## 2017-02-24 DIAGNOSIS — J90 Pleural effusion, not elsewhere classified: Secondary | ICD-10-CM | POA: Insufficient documentation

## 2017-02-24 DIAGNOSIS — K811 Chronic cholecystitis: Secondary | ICD-10-CM | POA: Insufficient documentation

## 2017-02-24 DIAGNOSIS — K819 Cholecystitis, unspecified: Secondary | ICD-10-CM

## 2017-02-24 DIAGNOSIS — I255 Ischemic cardiomyopathy: Secondary | ICD-10-CM

## 2017-02-24 DIAGNOSIS — R059 Cough, unspecified: Secondary | ICD-10-CM

## 2017-02-24 DIAGNOSIS — R531 Weakness: Secondary | ICD-10-CM

## 2017-02-24 MED ORDER — FUROSEMIDE 20 MG PO TABS: 20.0000 mg | ORAL_TABLET | Freq: Every day | ORAL | 1 refills | Status: DC

## 2017-02-24 NOTE — Assessment & Plan Note (Addendum)
Surg ref for a consultation

## 2017-02-24 NOTE — Assessment & Plan Note (Addendum)
Card appt w/Dr [name]

## 2017-02-24 NOTE — Assessment & Plan Note (Signed)
Furosemide prn 

## 2017-02-24 NOTE — Assessment & Plan Note (Signed)
B mild Furosemide F/u w/Dr [name]

## 2017-02-24 NOTE — Progress Notes (Signed)
Subjective:  Patient ID: [name] Christensen, male    DOB: 02-28-1932  Age: 83 y.o. MRN: [id number]  CC: Bloated (follow up and results)   HPI [employer] presents for fatigue - same F/u bradycardia - taking less Atenolol F/u constipation - better w/Linzess   Outpatient Medications Prior to Visit  Medication Sig Dispense Refill  . alfuzosin (UROXATRAL) 10 MG 24 hr tablet Take 10 mg by mouth every evening.     [name] amiodarone (PACERONE) 200 MG tablet Take one tablet (200 mg) by mouth once daily 30 tablet 6  . aspirin 81 MG chewable tablet Chew 1 tablet (81 mg total) by mouth daily.    [name] atenolol (TENORMIN) 50 MG tablet TAKE ONE (1) TABLET BY MOUTH EVERY DAY 90 tablet 2  . atorvastatin (LIPITOR) 20 MG tablet Take 1 tablet (20 mg total) by mouth daily at 6 PM. 90 tablet 3  . betamethasone dipropionate (DIPROLENE) 0.05 % cream Apply topically 2 (two) times daily. 30 g 3  . Cholecalciferol (VITAMIN D3) 1000 UNITS CAPS Take 1 capsule by mouth daily.      . clopidogrel (PLAVIX) 75 MG tablet TAKE ONE (1) TABLET BY MOUTH EVERY DAY 30 tablet 5  . clotrimazole-betamethasone (LOTRISONE) cream Apply 1 application topically 2 (two) times daily. 45 g 3  . diazepam (VALIUM) 5 MG tablet Take 0.5-1 tablets (2.5-5 mg total) by mouth every 12 (twelve) hours as needed for anxiety. 30 tablet 2  . dutasteride (AVODART) 0.5 MG capsule Take 0.5 mg by mouth daily.    . hydrocortisone (ANUSOL-HC) 2.5 % rectal cream bid 30 g 1  . hydrocortisone (ANUSOL-HC) 25 MG suppository Use 1 rectal  At bedtime as needed 30 suppository 6  . linaclotide (LINZESS) 290 MCG CAPS capsule Take 1 capsule (290 mcg total) by mouth daily. 30 capsule 11  . losartan (COZAAR) 50 MG tablet Take 1 tablet (50 mg total) by mouth daily. 90 tablet 3  . Multiple Vitamin (MULTIVITAMIN WITH MINERALS) TABS Take 1 tablet by mouth daily.    . nitroGLYCERIN (NITROSTAT) 0.4 MG SL tablet PLACE ONE TABLET UNDER TONGUE EVERY 5 MINUTES AS NEEDED FOR CHEST  PAIN (3 DOSES MAX) 25 tablet 0  . Polyethylene Glycol 3350 (MIRALAX PO) Take by mouth.    . ranitidine (ZANTAC) 150 MG capsule Take 150 mg by mouth as needed for heartburn.     . vitamin B-12 (CYANOCOBALAMIN) 1000 MCG tablet Take 1,000 mcg by mouth daily.     Facility-Administered Medications Prior to Visit  Medication Dose Route Frequency Provider Last Rate Last Dose  . 0.9 %  sodium chloride infusion  500 mL Intravenous Continuous Hilarie Fredrickson, MD        ROS Review of Systems  Constitutional: Positive for fatigue. Negative for appetite change and unexpected weight change.  HENT: Negative for congestion, nosebleeds, sneezing, sore throat and trouble swallowing.   Eyes: Negative for itching and visual disturbance.  Respiratory: Negative for cough.   Cardiovascular: Negative for chest pain, palpitations and leg swelling.  Gastrointestinal: Negative for abdominal distention, blood in stool, diarrhea and nausea.  Genitourinary: Negative for frequency and hematuria.  Musculoskeletal: Negative for back pain, gait problem, joint swelling and neck pain.  Skin: Negative for rash.  Neurological: Negative for dizziness, tremors, speech difficulty and weakness.  Psychiatric/Behavioral: Negative for agitation, dysphoric mood, sleep disturbance and suicidal ideas. The patient is not nervous/anxious.     Objective:  BP (!) 142/68   Pulse (!) 48  Temp 98 F (36.7 C)   Ht 5\' 7"  (1.702 m)   Wt 153 lb (69.4 kg)   SpO2 98%   BMI 23.96 kg/m   BP Readings from Last 3 Encounters:  02/24/17 (!) 142/68  02/13/17 (!) 142/66  02/06/17 (!) 148/40    Wt Readings from Last 3 Encounters:  02/24/17 153 lb (69.4 kg)  02/13/17 153 lb (69.4 kg)  02/06/17 161 lb (73 kg)    Physical Exam  Constitutional: He is oriented to person, place, and time. He appears well-developed. No distress.  NAD  HENT:  Mouth/Throat: Oropharynx is clear and moist.  Eyes: Conjunctivae are normal. Pupils are equal, round,  and reactive to light.  Neck: Normal range of motion. No JVD present. No thyromegaly present.  Cardiovascular: Normal rate, regular rhythm, normal heart sounds and intact distal pulses.  Exam reveals no gallop and no friction rub.   No murmur heard. Pulmonary/Chest: Effort normal and breath sounds normal. No respiratory distress. He has no wheezes. He has no rales. He exhibits no tenderness.  Abdominal: Soft. Bowel sounds are normal. He exhibits no distension and no mass. There is no tenderness. There is no rebound and no guarding.  Musculoskeletal: Normal range of motion. He exhibits no edema or tenderness.  Lymphadenopathy:    He has no cervical adenopathy.  Neurological: He is alert and oriented to person, place, and time. He has normal reflexes. No cranial nerve deficit. He exhibits normal muscle tone. He displays a negative Romberg sign. Coordination and gait normal.  Skin: Skin is warm and dry. No rash noted.  Psychiatric: He has a normal mood and affect. His behavior is normal. Judgment and thought content normal.  bradycardic  Lab Results  Component Value Date   WBC 6.9 01/22/2017   HGB 12.6 (L) 01/22/2017   HCT 37.4 (L) 01/22/2017   PLT 96.0 (L) 01/22/2017   GLUCOSE 100 (H) 02/06/2017   CHOL 118 09/11/2016   TRIG 62.0 09/11/2016   HDL 49.60 09/11/2016   LDLCALC 56 09/11/2016   ALT 24 02/12/2017   AST 31 02/12/2017   NA 127 (L) 02/06/2017   K 4.7 02/06/2017   CL 94 (L) 02/06/2017   CREATININE 1.12 02/06/2017   BUN 13 02/06/2017   CO2 28 02/06/2017   TSH 3.250 01/22/2017   PSA 12.45 (H) 02/06/2016   INR 1.18 05/27/2015   HGBA1C 6.3 (H) 10/30/2012    US Abdomen Complete  Result Date: 02/12/2017 CLINICAL DATA:  83 year old male with abnormal liver function tests. Bloating. Initial encounter. EXAM: ABDOMEN ULTRASOUND COMPLETE COMPARISON:  CT Abdomen and Pelvis 12/13/2014. FINDINGS: Gallbladder: The gallbladder appears mildly contracted but demonstrates generalized wall  thickening of 4-5 mm. No internal echogenic sludge or stones are identified. No pericholecystic fluid. No sonographic Murphy sign elicited. Common bile duct: Diameter: 3 mm, normal. Liver: Small chronic an simple appearing left hepatic lobe cyst re- demonstrated (image 20) measuring 11 mm 12 mm (9-10 mm in 2016). A similar subcapsular right lobe cyst measures 19 mm today (image 30, 16 mm previously). Overall liver echogenicity is normal. No intrahepatic biliary ductal dilatation. No new liver lesion. IVC: No abnormality visualized. Pancreas: Visualized portion unremarkable. Spleen: Size and appearance within normal limits. Right Kidney: Length: 11.2 cm. Mildly more echogenic than the liver (image 68) but relatively maintained cortical thickness. No hydronephrosis or right renal mass. Left Kidney: Length: 11.6 cm. Chronic extrarenal pelvis (normal variant). Echogenicity appears similar to the right kidney. No left hydronephrosis or left renal  mass. Abdominal aorta: Irregularity in keeping with the calcified atherosclerosis demonstrated previously. No abdominal aortic aneurysm. Other findings: Bilateral pleural effusions are evident, probably small in volume. No abdominal free fluid. IMPRESSION: 1. Suspect Chronic Cholecystitis; abnormal gallbladder wall thickening without sludge or stones identified, and no sonographic Murphy sign elicited. 2. Ultrasound appearance of the liver is within normal limits. Two simple hepatic cysts re- demonstrated. 3. Evidence of chronic medical renal disease. 4. Bilateral pleural effusions, probably small. 5. Chronic calcified aortic atherosclerosis. Electronically Signed   By: Odessa Fleming M.D.   On: 02/12/2017 11:18    Assessment & Plan:   There are no diagnoses linked to this encounter. I am having Mr. Koski maintain his Vitamin D3, aspirin, alfuzosin, multivitamin with minerals, ranitidine, betamethasone dipropionate, vitamin B-12, clotrimazole-betamethasone, atorvastatin,  losartan, linaclotide, clopidogrel, diazepam, atenolol, dutasteride, Polyethylene Glycol 3350 (MIRALAX PO), amiodarone, hydrocortisone, nitroGLYCERIN, and hydrocortisone. We will continue to administer sodium chloride.  No orders of the defined types were placed in this encounter.    Follow-up: No Follow-up on file.  [name], MD

## 2017-02-25 ENCOUNTER — Other Ambulatory Visit: Payer: Medicare Other

## 2017-02-27 ENCOUNTER — Encounter: Payer: Self-pay | Admitting: Internal Medicine

## 2017-02-28 ENCOUNTER — Other Ambulatory Visit: Payer: Self-pay | Admitting: Internal Medicine

## 2017-03-13 ENCOUNTER — Telehealth: Payer: Self-pay | Admitting: Internal Medicine

## 2017-03-13 NOTE — Telephone Encounter (Signed)
Pt states he has internal hemorrhoids. Has appt scheduled for Tuesday but wants to know what he can do until appt. Pt states they keep coming out and he tries to get them to go back in and they will not stay. Pt states he has used annusol supp in the past but they do not help. Suggested pt try soaking in a tub and recticare otc. Pt verbalized understanding and will keep his appt for Tuesday. Pt reports he is interested in having banding done.

## 2017-03-17 NOTE — Progress Notes (Signed)
Electrophysiology Office Note Date: 03/25/2017  ID:  [name] Christensen, DOB 08/08/1932, MRN [id number]  PCP: [name], MD Primary Cardiologist: [name] Electrophysiologist: Graciela Husbands   CC: bradycardia follow up  [name] Christensen is a 83 y.o. male seen today for Dr Graciela Husbands.  He was recently started on amiodarone for increased PVC burden and has had resultant bradycardia. He presents today for follow up.   Since last being seen in our clinic, the patient reports increased fatigue and exercise intolerance.  He denies chest pain, palpitations, dyspnea, PND, orthopnea, nausea, vomiting, dizziness, syncope, edema, weight gain, or early satiety.  He has not had ICD shocks.   Device History: STJ single chamber ICD implanted 2013 for cardiac arrest History of appropriate therapy: No History of AAD therapy: Yes - amiodarone for PVC's    Past Medical History:  Diagnosis Date  . Allergy   . BPH (benign prostatic hypertrophy)    elevated PSA Dr. Lindell Noe Bx 2010  . Cardiac arrest [facility])    a. out-of-hospital arrest 10/25/2012 - EF 40-45%, patent grafts on cath, received [employer] AICD.  [name] Carotid disease, bilateral (HCC)    a. 0-39% by doppers.  . Cataract    bil cataracts removed  . CHF (congestive heart failure) (HCC)   . Coronary artery disease    a. s/p MI/CABG 2005. b. s/p cath at time of VF arrest 10/2013 - grafts patent.  . Elevated LFTs    a. 10/2012 felt due to cardiac arrest - Hepatitis C Ab reactive from 11/14/2012>>Hep C RNA PCR negative 10/25/2012.   [name] GERD (gastroesophageal reflux disease)   . Hyperlipidemia   . Hypertension   . Myocardial infarction (HCC)    2005 - CABG x 5 2005  . RBBB   . Ventricular bigeminy    a. Event monitor 01/2013: NSR with PVCs and occ bigeminy.   Past Surgical History:  Procedure Laterality Date  . APPENDECTOMY    . CARDIAC CATHETERIZATION  2007   with patent graft anatomy atretic left internal mammary  artery to the LAD which is nonobstructive. Will  restart study June 08, 2007  . CATARACT EXTRACTION W/PHACO Right 04/23/2016   Procedure: CATARACT EXTRACTION PHACO AND INTRAOCULAR LENS PLACEMENT (IOC);  Surgeon: [name], MD;  Location: [facility] ORS;  Service: Ophthalmology;  Laterality: Right;  [place] 1.09 AP% 19.3 CDE 13.38 Fluid pack lot # [id number]  . COLONOSCOPY    . CORONARY ARTERY BYPASS GRAFT  2005  . EYE SURGERY    . IMPLANTABLE CARDIOVERTER DEFIBRILLATOR IMPLANT N/A 11/24/2012   STJ single chamber ICD implanted by Dr Graciela Husbands for cardiac arrest   . INGUINAL HERNIA REPAIR Bilateral 01/17/2015   Procedure: BILATERAL LAPAROSCOPIC INGUINAL HERNIA REPAIR WITH LEFT FEMORAL HERNIA REPAIR;  Surgeon: [name], MD;  Location: [facility] OR;  Service: General;  Laterality: Bilateral;  . INSERTION OF MESH Bilateral 01/17/2015   Procedure: INSERTION OF MESH;  Surgeon: [name], MD;  Location: [facility] OR;  Service: General;  Laterality: Bilateral;  . LEFT HEART CATHETERIZATION WITH CORONARY/GRAFT ANGIOGRAM N/A 11/18/2012   Procedure: LEFT HEART CATHETERIZATION WITH Isabel Caprice;  Surgeon: [name], MD;  Location: [facility] CATH LAB;  Service: Cardiovascular;  Laterality: N/A;  . LUMBAR FUSION  09/2007    Current Outpatient Prescriptions  Medication Sig Dispense Refill  . alfuzosin (UROXATRAL) 10 MG 24 hr tablet Take 10 mg by mouth every evening.     [name] amiodarone (PACERONE) 200 MG tablet Take one tablet (200 mg) by  mouth once daily 30 tablet 6  . aspirin 81 MG chewable tablet Chew 1 tablet (81 mg total) by mouth daily.    [name] atenolol (TENORMIN) 50 MG tablet TAKE ONE (1) TABLET BY MOUTH EVERY DAY 90 tablet 2  . atorvastatin (LIPITOR) 20 MG tablet Take 1 tablet (20 mg total) by mouth daily at 6 PM. 90 tablet 3  . betamethasone dipropionate (DIPROLENE) 0.05 % cream Apply topically 2 (two) times daily. 30 g 3  . Cholecalciferol (VITAMIN D3) 1000 UNITS CAPS Take 1 capsule by mouth daily.      . clopidogrel (PLAVIX) 75 MG tablet TAKE ONE (1) TABLET  BY MOUTH EVERY DAY 30 tablet 5  . clotrimazole-betamethasone (LOTRISONE) cream Apply 1 application topically 2 (two) times daily. 45 g 3  . diazepam (VALIUM) 5 MG tablet Take 0.5-1 tablets (2.5-5 mg total) by mouth every 12 (twelve) hours as needed for anxiety. 30 tablet 2  . dutasteride (AVODART) 0.5 MG capsule Take 0.5 mg by mouth daily.    . furosemide (LASIX) 20 MG tablet Take 1 tablet (20 mg total) by mouth daily. 30 tablet 1  . hydrocortisone (ANUSOL-HC) 2.5 % rectal cream bid 30 g 1  . hydrocortisone (ANUSOL-HC) 25 MG suppository Use 1 rectal  At bedtime as needed 30 suppository 6  . linaclotide (LINZESS) 290 MCG CAPS capsule Take 1 capsule (290 mcg total) by mouth daily. 30 capsule 11  . losartan (COZAAR) 50 MG tablet Take 1 tablet (50 mg total) by mouth daily. 90 tablet 1  . Multiple Vitamin (MULTIVITAMIN WITH MINERALS) TABS Take 1 tablet by mouth daily.    . nitroGLYCERIN (NITROSTAT) 0.4 MG SL tablet PLACE ONE TABLET UNDER TONGUE EVERY 5 MINUTES AS NEEDED FOR CHEST PAIN (3 DOSES MAX) 25 tablet 0  . Polyethylene Glycol 3350 (MIRALAX PO) Take by mouth.    . ranitidine (ZANTAC) 150 MG capsule Take 150 mg by mouth as needed for heartburn.     . vitamin B-12 (CYANOCOBALAMIN) 1000 MCG tablet Take 1,000 mcg by mouth daily.     Current Facility-Administered Medications  Medication Dose Route Frequency Provider Last Rate Last Dose  . 0.9 %  sodium chloride infusion  500 mL Intravenous Continuous Hilarie Fredrickson, MD        Allergies:   Ezetimibe; Niacin; Penicillins; Pravastatin sodium; and Rosuvastatin   Social History: Social History   Social History  . Marital status: Married    Spouse name: N/A  . Number of children: 5  . Years of education: N/A   Occupational History  . Realtor [employer]   Social History Main Topics  . Smoking status: Never Smoker  . Smokeless tobacco: Never Used  . Alcohol use No  . Drug use: No  . Sexual activity: Not on file   Other Topics Concern    . Not on file   Social History Narrative  . No narrative on file    Family History: Family History  Problem Relation Age of Onset  . Coronary artery disease Mother   . Heart attack Brother   . Stomach cancer Neg Hx   . Colon cancer Neg Hx   . Esophageal cancer Neg Hx   . Pancreatic cancer Neg Hx   . Prostate cancer Neg Hx   . Rectal cancer Neg Hx     Review of Systems: All other systems reviewed and are otherwise negative except as noted above.   Physical Exam: VS:  BP (!) 138/58   Pulse [name])  48   Ht 5\' 7"  (1.702 m)   Wt 156 lb (70.8 kg)   SpO2 97%   BMI 24.43 kg/m  , BMI Body mass index is 24.43 kg/m.  GEN- The patient is elderly appearing, alert and oriented x 3 today.   HEENT: normocephalic, atraumatic; sclera clear, conjunctiva pink; hearing intact; oropharynx clear; neck supple  Lungs- Clear to ausculation bilaterally, normal work of breathing.  No wheezes, rales, rhonchi Heart- Bradycardic regular rate and rhythm with frequent ectopy  GI- soft, non-tender, non-distended, bowel sounds present  Extremities- no clubbing, cyanosis, or edema  MS- no significant deformity or atrophy Skin- warm and dry, no rash or lesion; ICD pocket well healed Psych- euthymic mood, full affect Neuro- strength and sensation are intact  ICD interrogation- reviewed in detail today,  See PACEART report  EKG:  EKG is not ordered today.  Recent Labs: 01/22/2017: Hemoglobin 12.6; Platelets 96.0; TSH 3.250 02/06/2017: BUN 13; Creatinine, Ser 1.12; Magnesium 1.9; Potassium 4.7; Sodium 127 02/12/2017: ALT 24   Wt Readings from Last 3 Encounters:  03/25/17 156 lb (70.8 kg)  03/18/17 151 lb (68.5 kg)  02/24/17 153 lb (69.4 kg)     Other studies Reviewed: Additional studies/ records that were reviewed today include: Dr Odessa Fleming office notes   Assessment and Plan:  1.  Cardiac arrest  Normal ICD function See Pace Art report No changes today  2. Chronic systolic  dysfunction euvolemic today Stable on an appropriate medical regimen  3.  PVC's Still with significant burden by histograms despite amiodarone Continue amiodarone for now. Unable to up-titrate 2/2 bradycardia TSH, LFT's recently normal   4.  Sinus bradycardia/RBBB/1st degree AV block RV pacing 20% by device interrogation today He is symptomatic with fatigue and exercise intolerance We have discussed upgrade to dual chamber ICD today. Risks, benefits reviewed with patient who wishes to proceed. Will hold Plavix for 3 days prior to procedure.   5.  CAD  Stable No change required today    Current medicines are reviewed at length with the patient today.   The patient does not have concerns regarding his medicines.  The following changes were made today:  none  Labs/ tests ordered today include: preprocedure labs  Orders Placed This Encounter  Procedures  . CUP PACEART INCLINIC DEVICE CHECK     Disposition:   Follow up with Dr Graciela Husbands after ICD upgrade    Signed, Gypsy Balsam, NP 03/25/2017 10:05 AM  [facility] [address] [place] [address] [phone] (office) [phone] (fax)

## 2017-03-18 ENCOUNTER — Ambulatory Visit (INDEPENDENT_AMBULATORY_CARE_PROVIDER_SITE_OTHER): Payer: Medicare Other | Admitting: Physician Assistant

## 2017-03-18 ENCOUNTER — Encounter: Payer: Self-pay | Admitting: Physician Assistant

## 2017-03-18 ENCOUNTER — Telehealth: Payer: Self-pay | Admitting: Emergency Medicine

## 2017-03-18 VITALS — BP 110/65 | HR 80 | Wt 151.0 lb

## 2017-03-18 DIAGNOSIS — I255 Ischemic cardiomyopathy: Secondary | ICD-10-CM | POA: Diagnosis not present

## 2017-03-18 DIAGNOSIS — K648 Other hemorrhoids: Secondary | ICD-10-CM | POA: Diagnosis not present

## 2017-03-18 DIAGNOSIS — K5900 Constipation, unspecified: Secondary | ICD-10-CM | POA: Diagnosis not present

## 2017-03-18 NOTE — Telephone Encounter (Signed)
   [name] Christensen 01-01-1932 [id number]  Dear Dr. Johnsie Cancel:  We have scheduled the above named patient for a(n) hemorrhoid banding procedure. Our records show that (s)he is on anticoagulation therapy.  Please advise as to whether the patient may come off their therapy of Plavix 3 days prior to their procedure and one week after. Generally this procedure is done 3 times, two weeks apart. Please advise if you think patient is medically stable.  Please route your response to Tinnie Gens, CMA or fax response to [phone].  Sincerely,    [facility]

## 2017-03-18 NOTE — Progress Notes (Addendum)
EYE SURGERY    . IMPLANTABLE CARDIOVERTER DEFIBRILLATOR IMPLANT N/A 11/24/2012   STJ single chamber ICD implanted by Dr [name] for cardiac arrest   . INGUINAL HERNIA REPAIR Bilateral 01/17/2015   Procedure: BILATERAL LAPAROSCOPIC INGUINAL HERNIA REPAIR WITH LEFT FEMORAL HERNIA REPAIR;  Surgeon: Michael Boston, MD;  Location: [place];  Service: General;  Laterality: Bilateral;  . INSERTION OF MESH Bilateral 01/17/2015   Procedure: INSERTION OF MESH;  Surgeon: Michael Boston, MD;  Location: [place];  Service: General;  Laterality: Bilateral;  . LEFT HEART CATHETERIZATION WITH CORONARY/GRAFT ANGIOGRAM N/A 11/24/2012   Procedure: LEFT HEART  CATHETERIZATION WITH Beatrix Fetters;  Surgeon: Peter M Martinique, MD;  Location: [facility] CATH LAB;  Service: Cardiovascular;  Laterality: N/A;  . LUMBAR FUSION  09/2007    Current Outpatient Prescriptions  Medication Sig Dispense Refill  . alfuzosin (UROXATRAL) 10 MG 24 hr tablet Take 10 mg by mouth every evening.     [name] amiodarone (PACERONE) 200 MG tablet Take one tablet (200 mg) by mouth once daily 30 tablet 6  . aspirin 81 MG chewable tablet Chew 1 tablet (81 mg total) by mouth daily.    [name] atenolol (TENORMIN) 50 MG tablet TAKE ONE (1) TABLET BY MOUTH EVERY DAY 90 tablet 2  . atorvastatin (LIPITOR) 20 MG tablet Take 1 tablet (20 mg total) by mouth daily at 6 PM. 90 tablet 3  . betamethasone dipropionate (DIPROLENE) 0.05 % cream Apply topically 2 (two) times daily. 30 g 3  . Cholecalciferol (VITAMIN D3) 1000 UNITS CAPS Take 1 capsule by mouth daily.      . clopidogrel (PLAVIX) 75 MG tablet TAKE ONE (1) TABLET BY MOUTH EVERY DAY 30 tablet 5  . clotrimazole-betamethasone (LOTRISONE) cream Apply 1 application topically 2 (two) times daily. 45 g 3  . diazepam (VALIUM) 5 MG tablet Take 0.5-1 tablets (2.5-5 mg total) by mouth every 12 (twelve) hours as needed for anxiety. 30 tablet 2  . dutasteride (AVODART) 0.5 MG capsule Take 0.5 mg by mouth daily.    . furosemide (LASIX) 20 MG tablet Take 1 tablet (20 mg total) by mouth daily. 30 tablet 1  . hydrocortisone (ANUSOL-HC) 2.5 % rectal cream bid 30 g 1  . hydrocortisone (ANUSOL-HC) 25 MG suppository Use 1 rectal  At bedtime as needed 30 suppository 6  . linaclotide (LINZESS) 290 MCG CAPS capsule Take 1 capsule (290 mcg total) by mouth daily. 30 capsule 11  . losartan (COZAAR) 50 MG tablet Take 1 tablet (50 mg total) by mouth daily. 90 tablet 1  . Multiple Vitamin (MULTIVITAMIN WITH MINERALS) TABS Take 1 tablet by mouth daily.    . nitroGLYCERIN (NITROSTAT) 0.4 MG SL tablet PLACE ONE TABLET UNDER TONGUE EVERY 5 MINUTES AS NEEDED FOR CHEST PAIN (3  DOSES MAX) 25 tablet 0  . Polyethylene Glycol 3350 (MIRALAX PO) Take by mouth.    . ranitidine (ZANTAC) 150 MG capsule Take 150 mg by mouth as needed for heartburn.     . vitamin B-12 (CYANOCOBALAMIN) 1000 MCG tablet Take 1,000 mcg by mouth daily.     Current Facility-Administered Medications  Medication Dose Route Frequency Provider Last Rate Last Dose  . 0.9 %  sodium chloride infusion  500 mL Intravenous Continuous Irene Shipper, MD        Allergies as of 03/18/2017 - Review Complete 03/18/2017  Allergen Reaction Noted  . Ezetimibe  12/12/2010  . Niacin  09/07/2008  . Penicillins Other (See Comments) 08/27/2007  . Pravastatin sodium  EYE SURGERY    . IMPLANTABLE CARDIOVERTER DEFIBRILLATOR IMPLANT N/A 11/24/2012   STJ single chamber ICD implanted by Dr [name] for cardiac arrest   . INGUINAL HERNIA REPAIR Bilateral 01/17/2015   Procedure: BILATERAL LAPAROSCOPIC INGUINAL HERNIA REPAIR WITH LEFT FEMORAL HERNIA REPAIR;  Surgeon: Michael Boston, MD;  Location: [place];  Service: General;  Laterality: Bilateral;  . INSERTION OF MESH Bilateral 01/17/2015   Procedure: INSERTION OF MESH;  Surgeon: Michael Boston, MD;  Location: [place];  Service: General;  Laterality: Bilateral;  . LEFT HEART CATHETERIZATION WITH CORONARY/GRAFT ANGIOGRAM N/A 11/24/2012   Procedure: LEFT HEART  CATHETERIZATION WITH Beatrix Fetters;  Surgeon: Peter M Martinique, MD;  Location: [facility] CATH LAB;  Service: Cardiovascular;  Laterality: N/A;  . LUMBAR FUSION  09/2007    Current Outpatient Prescriptions  Medication Sig Dispense Refill  . alfuzosin (UROXATRAL) 10 MG 24 hr tablet Take 10 mg by mouth every evening.     [name] amiodarone (PACERONE) 200 MG tablet Take one tablet (200 mg) by mouth once daily 30 tablet 6  . aspirin 81 MG chewable tablet Chew 1 tablet (81 mg total) by mouth daily.    [name] atenolol (TENORMIN) 50 MG tablet TAKE ONE (1) TABLET BY MOUTH EVERY DAY 90 tablet 2  . atorvastatin (LIPITOR) 20 MG tablet Take 1 tablet (20 mg total) by mouth daily at 6 PM. 90 tablet 3  . betamethasone dipropionate (DIPROLENE) 0.05 % cream Apply topically 2 (two) times daily. 30 g 3  . Cholecalciferol (VITAMIN D3) 1000 UNITS CAPS Take 1 capsule by mouth daily.      . clopidogrel (PLAVIX) 75 MG tablet TAKE ONE (1) TABLET BY MOUTH EVERY DAY 30 tablet 5  . clotrimazole-betamethasone (LOTRISONE) cream Apply 1 application topically 2 (two) times daily. 45 g 3  . diazepam (VALIUM) 5 MG tablet Take 0.5-1 tablets (2.5-5 mg total) by mouth every 12 (twelve) hours as needed for anxiety. 30 tablet 2  . dutasteride (AVODART) 0.5 MG capsule Take 0.5 mg by mouth daily.    . furosemide (LASIX) 20 MG tablet Take 1 tablet (20 mg total) by mouth daily. 30 tablet 1  . hydrocortisone (ANUSOL-HC) 2.5 % rectal cream bid 30 g 1  . hydrocortisone (ANUSOL-HC) 25 MG suppository Use 1 rectal  At bedtime as needed 30 suppository 6  . linaclotide (LINZESS) 290 MCG CAPS capsule Take 1 capsule (290 mcg total) by mouth daily. 30 capsule 11  . losartan (COZAAR) 50 MG tablet Take 1 tablet (50 mg total) by mouth daily. 90 tablet 1  . Multiple Vitamin (MULTIVITAMIN WITH MINERALS) TABS Take 1 tablet by mouth daily.    . nitroGLYCERIN (NITROSTAT) 0.4 MG SL tablet PLACE ONE TABLET UNDER TONGUE EVERY 5 MINUTES AS NEEDED FOR CHEST PAIN (3  DOSES MAX) 25 tablet 0  . Polyethylene Glycol 3350 (MIRALAX PO) Take by mouth.    . ranitidine (ZANTAC) 150 MG capsule Take 150 mg by mouth as needed for heartburn.     . vitamin B-12 (CYANOCOBALAMIN) 1000 MCG tablet Take 1,000 mcg by mouth daily.     Current Facility-Administered Medications  Medication Dose Route Frequency Provider Last Rate Last Dose  . 0.9 %  sodium chloride infusion  500 mL Intravenous Continuous Irene Shipper, MD        Allergies as of 03/18/2017 - Review Complete 03/18/2017  Allergen Reaction Noted  . Ezetimibe  12/12/2010  . Niacin  09/07/2008  . Penicillins Other (See Comments) 08/27/2007  . Pravastatin sodium  Chief Complaint: Internal Hemorrhoids  HPI:  [name] Christensen is an 83 year old Caucasian male with a past medical history of CHF, coronary artery disease maintained on Plavix, GERD and other extensive cardiac history listed below,  who regularly follows with Dr. Henrene Pastor and presents to clinic today to discuss banding of his internal hemorrhoids.     Per review of chart patient recently had a colonoscopy 01/02/17 with Dr. Henrene Pastor and at that time a finding of 2 3-4 mm polyps in the transverse colon and in the cecum, diverticulosis in the sigmoid colon and internal hemorrhoids which are noted to be medium in size. It was discussed with the patient that he should try MiraLAX once daily for constipation and Metamucil 1 heaping tablespoon daily as well as Anusol suppositories.   Today, the patient returns to clinic and tells me that he is currently on Linzess 290 mcg which he takes every 3 days. This does result in a bowel movement, but if he takes it more often he has other side effects including a "blagh" feeling. Patient tells me that he typically has to strain for a bowel movement. He has tried Metamucil and MiraLAX in the past which have not helped. He tells me that all of this makes his hemorrhoids worse. Currently he is having to continually "push them back up inside", on an almost daily basis. He has tried Anusol suppositories which are no longer helping. It was discussed with Dr. Henrene Pastor at the last office visit that banding may be a possibility. Patient says he is willing to do whatever he needs to do to get rid of them as they are hindering his daily life. They do cause occasional bleeding and are quite "irritating".   Patient denies fever, chills, change in bowel habits, weight loss, nausea, vomiting or symptoms that awaken him at night.   Past Medical History:  Diagnosis Date  . Allergy   . BPH (benign prostatic hypertrophy)    elevated PSA Dr. Jilda Roche Bx 2010  . Cardiac arrest [facility])    a.  out-of-hospital arrest 10/26/2012 - EF 40-45%, patent grafts on cath, received St. Jude AICD.  [name] Carotid disease, bilateral ([place])    a. 0-39% by doppers.  . Cataract    bil cataracts removed  . CHF (congestive heart failure) ([place])   . Coronary artery disease    a. s/p MI/CABG 2005. b. s/p cath at time of VF arrest 10/2013 - grafts patent.  . Elevated LFTs    a. 10/2012 felt due to cardiac arrest - Hepatitis C Ab reactive from 11/08/2012>>Hep C RNA PCR negative 10/30/2012.   [name] GERD (gastroesophageal reflux disease)   . Hyperlipidemia   . Hypertension   . Myocardial infarction ([place])    2005 - CABG x 5 2005  . RBBB   . Ventricular bigeminy    a. Event monitor 01/2013: NSR with PVCs and occ bigeminy.    Past Surgical History:  Procedure Laterality Date  . APPENDECTOMY    . CARDIAC CATHETERIZATION  2007   with patent graft anatomy atretic left internal mammary  artery to the LAD which is nonobstructive. Will restart study June 08, 2007  . CATARACT EXTRACTION W/PHACO Right 04/23/2016   Procedure: CATARACT EXTRACTION PHACO AND INTRAOCULAR LENS PLACEMENT (IOC);  Surgeon: Birder Robson, MD;  Location: [facility] ORS;  Service: Ophthalmology;  Laterality: Right;  [place] 1.09 AP% 19.3 CDE 13.38 Fluid pack lot # [id number]  . COLONOSCOPY    . CORONARY ARTERY BYPASS GRAFT  2005  .  Chief Complaint: Internal Hemorrhoids  HPI:  [name] Christensen is an 83 year old Caucasian male with a past medical history of CHF, coronary artery disease maintained on Plavix, GERD and other extensive cardiac history listed below,  who regularly follows with Dr. Henrene Pastor and presents to clinic today to discuss banding of his internal hemorrhoids.     Per review of chart patient recently had a colonoscopy 01/02/17 with Dr. Henrene Pastor and at that time a finding of 2 3-4 mm polyps in the transverse colon and in the cecum, diverticulosis in the sigmoid colon and internal hemorrhoids which are noted to be medium in size. It was discussed with the patient that he should try MiraLAX once daily for constipation and Metamucil 1 heaping tablespoon daily as well as Anusol suppositories.   Today, the patient returns to clinic and tells me that he is currently on Linzess 290 mcg which he takes every 3 days. This does result in a bowel movement, but if he takes it more often he has other side effects including a "blagh" feeling. Patient tells me that he typically has to strain for a bowel movement. He has tried Metamucil and MiraLAX in the past which have not helped. He tells me that all of this makes his hemorrhoids worse. Currently he is having to continually "push them back up inside", on an almost daily basis. He has tried Anusol suppositories which are no longer helping. It was discussed with Dr. Henrene Pastor at the last office visit that banding may be a possibility. Patient says he is willing to do whatever he needs to do to get rid of them as they are hindering his daily life. They do cause occasional bleeding and are quite "irritating".   Patient denies fever, chills, change in bowel habits, weight loss, nausea, vomiting or symptoms that awaken him at night.   Past Medical History:  Diagnosis Date  . Allergy   . BPH (benign prostatic hypertrophy)    elevated PSA Dr. Jilda Roche Bx 2010  . Cardiac arrest [facility])    a.  out-of-hospital arrest 10/26/2012 - EF 40-45%, patent grafts on cath, received St. Jude AICD.  [name] Carotid disease, bilateral ([place])    a. 0-39% by doppers.  . Cataract    bil cataracts removed  . CHF (congestive heart failure) ([place])   . Coronary artery disease    a. s/p MI/CABG 2005. b. s/p cath at time of VF arrest 10/2013 - grafts patent.  . Elevated LFTs    a. 10/2012 felt due to cardiac arrest - Hepatitis C Ab reactive from 11/08/2012>>Hep C RNA PCR negative 10/30/2012.   [name] GERD (gastroesophageal reflux disease)   . Hyperlipidemia   . Hypertension   . Myocardial infarction ([place])    2005 - CABG x 5 2005  . RBBB   . Ventricular bigeminy    a. Event monitor 01/2013: NSR with PVCs and occ bigeminy.    Past Surgical History:  Procedure Laterality Date  . APPENDECTOMY    . CARDIAC CATHETERIZATION  2007   with patent graft anatomy atretic left internal mammary  artery to the LAD which is nonobstructive. Will restart study June 08, 2007  . CATARACT EXTRACTION W/PHACO Right 04/23/2016   Procedure: CATARACT EXTRACTION PHACO AND INTRAOCULAR LENS PLACEMENT (IOC);  Surgeon: Birder Robson, MD;  Location: [facility] ORS;  Service: Ophthalmology;  Laterality: Right;  [place] 1.09 AP% 19.3 CDE 13.38 Fluid pack lot # [id number]  . COLONOSCOPY    . CORONARY ARTERY BYPASS GRAFT  2005  .

## 2017-03-18 NOTE — Telephone Encounter (Signed)
Ok to hold plavix 5 days before banding procedure

## 2017-03-19 NOTE — Telephone Encounter (Signed)
See Msg below. Ok-JLL

## 2017-03-19 NOTE — Telephone Encounter (Signed)
That's fine

## 2017-03-19 NOTE — Telephone Encounter (Signed)
Hello Dr. Johnsie Cancel.     Sorry to ask again, but specifically Dr. Hilarie Fredrickson would like to hold patient's Plavix for 3 days before the banding and 7 days after on 3 separate occasions about 2 weeks apart. Would this be acceptable for Mr. Rapozo?  Thank you Ellouise Newer, PA-C

## 2017-03-19 NOTE — Telephone Encounter (Signed)
Spoke to patient and scheduled him for hemorrhoid banding. He understands that he needs to hold his Plavix 3 days before and 7 days after. He requested to be placed on a cancellation list.

## 2017-03-19 NOTE — Progress Notes (Signed)
Note reviewed. PA arranged follow up with Dr. Hilarie Fredrickson. Management of plavix per his direction.

## 2017-03-24 ENCOUNTER — Other Ambulatory Visit: Payer: Self-pay | Admitting: Internal Medicine

## 2017-03-24 DIAGNOSIS — K645 Perianal venous thrombosis: Secondary | ICD-10-CM | POA: Diagnosis not present

## 2017-03-24 DIAGNOSIS — K828 Other specified diseases of gallbladder: Secondary | ICD-10-CM | POA: Diagnosis not present

## 2017-03-24 DIAGNOSIS — K642 Third degree hemorrhoids: Secondary | ICD-10-CM | POA: Diagnosis not present

## 2017-03-25 ENCOUNTER — Encounter: Payer: Self-pay | Admitting: Nurse Practitioner

## 2017-03-25 ENCOUNTER — Ambulatory Visit (INDEPENDENT_AMBULATORY_CARE_PROVIDER_SITE_OTHER): Payer: Medicare Other | Admitting: Nurse Practitioner

## 2017-03-25 ENCOUNTER — Other Ambulatory Visit (HOSPITAL_COMMUNITY): Payer: Self-pay | Admitting: Surgery

## 2017-03-25 VITALS — BP 138/58 | HR 48 | Ht 67.0 in | Wt 156.0 lb

## 2017-03-25 DIAGNOSIS — I255 Ischemic cardiomyopathy: Secondary | ICD-10-CM

## 2017-03-25 DIAGNOSIS — R001 Bradycardia, unspecified: Secondary | ICD-10-CM | POA: Diagnosis not present

## 2017-03-25 DIAGNOSIS — I5022 Chronic systolic (congestive) heart failure: Secondary | ICD-10-CM

## 2017-03-25 DIAGNOSIS — I469 Cardiac arrest, cause unspecified: Secondary | ICD-10-CM

## 2017-03-25 DIAGNOSIS — I493 Ventricular premature depolarization: Secondary | ICD-10-CM | POA: Diagnosis not present

## 2017-03-25 DIAGNOSIS — K828 Other specified diseases of gallbladder: Secondary | ICD-10-CM

## 2017-03-25 LAB — CUP PACEART INCLINIC DEVICE CHECK
Date Time Interrogation Session: 20180501100346
Implantable Lead Implant Date: 20131218
Implantable Lead Location: 753860
Implantable Pulse Generator Implant Date: 20131218
Pulse Gen Serial Number: 7048849

## 2017-03-25 NOTE — Patient Instructions (Addendum)
Medication Instructions: None Ordered   Labwork: Your physician recommends that you return for lab work on 04/16/2017 for CBC. BMET. PT/INR   Testing/Procedures: Your physician has recommended that you have a defibrillator upgraded. This is done in the hospital and usually requires an overnight stay. Please see the instruction sheet given to you today for more information.      Follow-Up: .Your physician recommends that you schedule a follow-up appointment in: 10-14 with Device clinic after 04/23/2017  Your physician recommends that you schedule a follow-up appointment in: 91 days after 04/23/2017 with Dr. [name].     Any Other Special Instructions Will Be Listed Below (If Applicable).     If you need a refill on your cardiac medications before your next appointment, please call your pharmacy.

## 2017-03-27 ENCOUNTER — Encounter: Payer: Medicare Other | Admitting: Nurse Practitioner

## 2017-03-28 ENCOUNTER — Encounter: Payer: Medicare Other | Admitting: Nurse Practitioner

## 2017-03-31 ENCOUNTER — Telehealth: Payer: Self-pay | Admitting: Internal Medicine

## 2017-03-31 NOTE — Telephone Encounter (Addendum)
Spoke with patient today during his wife's PPM check. Patient stated at [place] with Chanetta Marshall 03/25/17 she stated if he continues to not feel good, SOB and weak, to call the office to reschedule the BIV upgrade sooner then currently scheduled 5/30. Patient states he has been SOB with exertion and really weak for a few weeks. Will forward message to Alvis Lemmings RN to reschedule BiV upgrade on a sooner date.

## 2017-03-31 NOTE — Telephone Encounter (Signed)
New message     Pt would like to speak with Dr [name] or Luetta Nutting, he would like to talk to you about moving the date up for having defibulator changed out ,   he is having sob and really weak   Pt c/o Shortness Of Breath: STAT if SOB developed within the last 24 hours or pt is noticeably SOB on the phone  1. Are you currently SOB (can you hear that pt is SOB on the phone)? no  2. How long have you been experiencing SOB? 3 weeks  3. Are you SOB when sitting or when up moving around? Both   4. Are you currently experiencing any other symptoms? Weakness

## 2017-04-01 ENCOUNTER — Other Ambulatory Visit: Payer: Self-pay | Admitting: *Deleted

## 2017-04-01 NOTE — Telephone Encounter (Signed)
Will need to review procedure schedule with Dr. [name] as there are no openings prior to 5/30.

## 2017-04-01 NOTE — Telephone Encounter (Signed)
Per Dr. [name], he has moved a case from 5/14 and we will r/s [name] Christensen to 5/14. I have made this switch with the EP lab. The patient is aware that he will need to arrive on 5/14 at 9:30 am for his gen change with possible lead revision. He will come in tomorrow for his pre-procedure lab work. He is aware to take his last dose of plavix on 04/03/17.

## 2017-04-02 ENCOUNTER — Other Ambulatory Visit: Payer: Medicare Other | Admitting: *Deleted

## 2017-04-02 DIAGNOSIS — R002 Palpitations: Secondary | ICD-10-CM | POA: Diagnosis not present

## 2017-04-02 DIAGNOSIS — I499 Cardiac arrhythmia, unspecified: Secondary | ICD-10-CM | POA: Diagnosis not present

## 2017-04-02 DIAGNOSIS — Z951 Presence of aortocoronary bypass graft: Secondary | ICD-10-CM

## 2017-04-02 DIAGNOSIS — I10 Essential (primary) hypertension: Secondary | ICD-10-CM

## 2017-04-02 DIAGNOSIS — E785 Hyperlipidemia, unspecified: Secondary | ICD-10-CM | POA: Diagnosis not present

## 2017-04-02 DIAGNOSIS — I2510 Atherosclerotic heart disease of native coronary artery without angina pectoris: Secondary | ICD-10-CM | POA: Diagnosis not present

## 2017-04-02 DIAGNOSIS — I469 Cardiac arrest, cause unspecified: Secondary | ICD-10-CM | POA: Diagnosis not present

## 2017-04-02 DIAGNOSIS — I208 Other forms of angina pectoris: Secondary | ICD-10-CM

## 2017-04-02 DIAGNOSIS — Z95810 Presence of automatic (implantable) cardiac defibrillator: Secondary | ICD-10-CM

## 2017-04-02 DIAGNOSIS — I498 Other specified cardiac arrhythmias: Secondary | ICD-10-CM

## 2017-04-02 LAB — BASIC METABOLIC PANEL
BUN/Creatinine Ratio: 16 (ref 10–24)
BUN: 16 mg/dL (ref 8–27)
CO2: 24 mmol/L (ref 18–29)
Calcium: 10.1 mg/dL (ref 8.6–10.2)
Chloride: 89 mmol/L — ABNORMAL LOW (ref 96–106)
Creatinine, Ser: 0.97 mg/dL (ref 0.76–1.27)
GFR calc Af Amer: 84 mL/min/{1.73_m2} (ref >59)
GFR calc non Af Amer: 72 mL/min/{1.73_m2} (ref >59)
Glucose: 96 mg/dL (ref 65–99)
Potassium: 4.4 mmol/L (ref 3.5–5.2)
Sodium: 127 mmol/L — ABNORMAL LOW (ref 134–144)

## 2017-04-02 LAB — CBC WITH DIFFERENTIAL/PLATELET
Basophils Absolute: 0 10*3/uL (ref 0.0–0.2)
Basos: 1 %
EOS (ABSOLUTE): 0.2 10*3/uL (ref 0.0–0.4)
Eos: 3 %
Hematocrit: 31.2 % — ABNORMAL LOW (ref 37.5–51.0)
Hemoglobin: 10.6 g/dL — ABNORMAL LOW (ref 13.0–17.7)
Immature Grans (Abs): 0.1 10*3/uL (ref 0.0–0.1)
Immature Granulocytes: 1 %
Lymphocytes Absolute: 1.3 10*3/uL (ref 0.7–3.1)
Lymphs: 23 %
MCH: 29.1 pg (ref 26.6–33.0)
MCHC: 34 g/dL (ref 31.5–35.7)
MCV: 86 fL (ref 79–97)
Monocytes Absolute: 0.6 10*3/uL (ref 0.1–0.9)
Monocytes: 11 %
Neutrophils Absolute: 3.6 10*3/uL (ref 1.4–7.0)
Neutrophils: 61 %
Platelets: 162 10*3/uL (ref 150–379)
RBC: 3.64 x10E6/uL — ABNORMAL LOW (ref 4.14–5.80)
RDW: 14.7 % (ref 12.3–15.4)
WBC: 5.9 10*3/uL (ref 3.4–10.8)

## 2017-04-02 LAB — PROTIME-INR
INR: 1.1 (ref 0.8–1.2)
Prothrombin Time: 11.9 s (ref 9.1–12.0)

## 2017-04-02 NOTE — Addendum Note (Signed)
Addended by: Eulis Foster on: 04/02/2017 07:39 AM   Modules accepted: Orders

## 2017-04-07 ENCOUNTER — Encounter (HOSPITAL_COMMUNITY): Payer: Self-pay | Admitting: Internal Medicine

## 2017-04-07 ENCOUNTER — Ambulatory Visit (HOSPITAL_COMMUNITY): Payer: Medicare Other

## 2017-04-07 ENCOUNTER — Ambulatory Visit (HOSPITAL_COMMUNITY)
Admission: RE | Admit: 2017-04-07 | Discharge: 2017-04-09 | Disposition: A | Discharge status: Home / Self Care | Payer: Medicare Other | Source: Ambulatory Visit | Attending: Internal Medicine | Admitting: Internal Medicine

## 2017-04-07 ENCOUNTER — Encounter (HOSPITAL_COMMUNITY)
Admission: RE | Disposition: A | Discharge status: Home / Self Care | Payer: Self-pay | Source: Ambulatory Visit | Attending: Internal Medicine

## 2017-04-07 DIAGNOSIS — Z951 Presence of aortocoronary bypass graft: Secondary | ICD-10-CM | POA: Diagnosis not present

## 2017-04-07 DIAGNOSIS — Z95810 Presence of automatic (implantable) cardiac defibrillator: Secondary | ICD-10-CM

## 2017-04-07 DIAGNOSIS — I5022 Chronic systolic (congestive) heart failure: Secondary | ICD-10-CM | POA: Insufficient documentation

## 2017-04-07 DIAGNOSIS — I495 Sick sinus syndrome: Secondary | ICD-10-CM | POA: Insufficient documentation

## 2017-04-07 DIAGNOSIS — K4020 Bilateral inguinal hernia, without obstruction or gangrene, not specified as recurrent: Secondary | ICD-10-CM | POA: Diagnosis present

## 2017-04-07 DIAGNOSIS — Z880 Allergy status to penicillin: Secondary | ICD-10-CM | POA: Insufficient documentation

## 2017-04-07 DIAGNOSIS — Z7902 Long term (current) use of antithrombotics/antiplatelets: Secondary | ICD-10-CM | POA: Insufficient documentation

## 2017-04-07 DIAGNOSIS — Z959 Presence of cardiac and vascular implant and graft, unspecified: Secondary | ICD-10-CM

## 2017-04-07 DIAGNOSIS — Z7982 Long term (current) use of aspirin: Secondary | ICD-10-CM | POA: Diagnosis not present

## 2017-04-07 DIAGNOSIS — I255 Ischemic cardiomyopathy: Secondary | ICD-10-CM | POA: Diagnosis not present

## 2017-04-07 DIAGNOSIS — K649 Unspecified hemorrhoids: Secondary | ICD-10-CM | POA: Diagnosis present

## 2017-04-07 DIAGNOSIS — E785 Hyperlipidemia, unspecified: Secondary | ICD-10-CM | POA: Insufficient documentation

## 2017-04-07 DIAGNOSIS — I440 Atrioventricular block, first degree: Secondary | ICD-10-CM | POA: Insufficient documentation

## 2017-04-07 DIAGNOSIS — Z4502 Encounter for adjustment and management of automatic implantable cardiac defibrillator: Secondary | ICD-10-CM | POA: Diagnosis not present

## 2017-04-07 DIAGNOSIS — I110 Hypertensive heart disease with heart failure: Secondary | ICD-10-CM | POA: Insufficient documentation

## 2017-04-07 DIAGNOSIS — K219 Gastro-esophageal reflux disease without esophagitis: Secondary | ICD-10-CM | POA: Diagnosis not present

## 2017-04-07 DIAGNOSIS — I252 Old myocardial infarction: Secondary | ICD-10-CM | POA: Diagnosis not present

## 2017-04-07 DIAGNOSIS — Y713 Surgical instruments, materials and cardiovascular devices (including sutures) associated with adverse incidents: Secondary | ICD-10-CM | POA: Insufficient documentation

## 2017-04-07 DIAGNOSIS — I4510 Unspecified right bundle-branch block: Secondary | ICD-10-CM | POA: Diagnosis not present

## 2017-04-07 DIAGNOSIS — J849 Interstitial pulmonary disease, unspecified: Secondary | ICD-10-CM | POA: Diagnosis not present

## 2017-04-07 DIAGNOSIS — T82120A Displacement of cardiac electrode, initial encounter: Secondary | ICD-10-CM | POA: Insufficient documentation

## 2017-04-07 DIAGNOSIS — I498 Other specified cardiac arrhythmias: Secondary | ICD-10-CM | POA: Diagnosis present

## 2017-04-07 DIAGNOSIS — R194 Change in bowel habit: Secondary | ICD-10-CM

## 2017-04-07 DIAGNOSIS — I499 Cardiac arrhythmia, unspecified: Secondary | ICD-10-CM

## 2017-04-07 DIAGNOSIS — I2510 Atherosclerotic heart disease of native coronary artery without angina pectoris: Secondary | ICD-10-CM | POA: Diagnosis not present

## 2017-04-07 DIAGNOSIS — I469 Cardiac arrest, cause unspecified: Secondary | ICD-10-CM | POA: Diagnosis not present

## 2017-04-07 DIAGNOSIS — N400 Enlarged prostate without lower urinary tract symptoms: Secondary | ICD-10-CM | POA: Insufficient documentation

## 2017-04-07 DIAGNOSIS — Z8249 Family history of ischemic heart disease and other diseases of the circulatory system: Secondary | ICD-10-CM | POA: Insufficient documentation

## 2017-04-07 HISTORY — PX: LEAD INSERTION: EP1212

## 2017-04-07 HISTORY — PX: ICD GENERATOR CHANGEOUT: EP1231

## 2017-04-07 LAB — SURGICAL PCR SCREEN
MRSA, PCR: NEGATIVE
Staphylococcus aureus: NEGATIVE

## 2017-04-07 SURGERY — ICD GENERATOR CHANGEOUT

## 2017-04-07 MED ORDER — LOSARTAN POTASSIUM 50 MG PO TABS
50.0000 mg | ORAL_TABLET | Freq: Every day | ORAL | Status: DC
  Administered 2017-04-07 – 2017-04-09 (×3): 50 mg via ORAL
  Filled 2017-04-07 (×4): qty 1

## 2017-04-07 MED ORDER — CHLORHEXIDINE GLUCONATE 4 % EX LIQD: 60.0000 mL | Freq: Once | CUTANEOUS | Status: DC

## 2017-04-07 MED ORDER — DIAZEPAM 5 MG PO TABS: 2.5000 mg | ORAL_TABLET | Freq: Two times a day (BID) | ORAL | Status: DC | PRN

## 2017-04-07 MED ORDER — MUPIROCIN 2 % EX OINT
1.0000 "application " | TOPICAL_OINTMENT | Freq: Once | CUTANEOUS | Status: AC
  Administered 2017-04-07: 1 via TOPICAL

## 2017-04-07 MED ORDER — ACETAMINOPHEN 325 MG PO TABS
325.0000 mg | ORAL_TABLET | ORAL | Status: DC | PRN
  Administered 2017-04-07 – 2017-04-08 (×3): 650 mg via ORAL
  Filled 2017-04-07 (×3): qty 2

## 2017-04-07 MED ORDER — VANCOMYCIN HCL IN DEXTROSE 1-5 GM/200ML-% IV SOLN
1000.0000 mg | INTRAVENOUS | Status: AC
  Administered 2017-04-07: 1000 mg via INTRAVENOUS

## 2017-04-07 MED ORDER — FENTANYL CITRATE (PF) 100 MCG/2ML IJ SOLN
INTRAMUSCULAR | Status: AC
  Filled 2017-04-07: qty 2

## 2017-04-07 MED ORDER — ATORVASTATIN CALCIUM 20 MG PO TABS
20.0000 mg | ORAL_TABLET | Freq: Every day | ORAL | Status: DC
  Administered 2017-04-07: 20 mg via ORAL
  Filled 2017-04-07 (×2): qty 1

## 2017-04-07 MED ORDER — SODIUM CHLORIDE 0.9 % IV SOLN
INTRAVENOUS | Status: DC
  Administered 2017-04-07: 11:00:00 via INTRAVENOUS

## 2017-04-07 MED ORDER — SODIUM CHLORIDE 0.9 % IV SOLN
INTRAVENOUS | Status: DC
  Administered 2017-04-07: 10:00:00 via INTRAVENOUS

## 2017-04-07 MED ORDER — CHOLECALCIFEROL 10 MCG (400 UNIT) PO TABS
400.0000 [IU] | ORAL_TABLET | Freq: Every day | ORAL | Status: DC
  Administered 2017-04-08 – 2017-04-09 (×2): 400 [IU] via ORAL
  Filled 2017-04-07 (×3): qty 1

## 2017-04-07 MED ORDER — ASPIRIN EC 81 MG PO TBEC
81.0000 mg | DELAYED_RELEASE_TABLET | Freq: Every evening | ORAL | Status: DC
  Filled 2017-04-07: qty 1

## 2017-04-07 MED ORDER — ONDANSETRON HCL 4 MG/2ML IJ SOLN: 4.0000 mg | Freq: Four times a day (QID) | INTRAMUSCULAR | Status: DC | PRN

## 2017-04-07 MED ORDER — MIDAZOLAM HCL 5 MG/5ML IJ SOLN
INTRAMUSCULAR | Status: DC | PRN
  Administered 2017-04-07 (×5): 1 mg via INTRAVENOUS

## 2017-04-07 MED ORDER — MUPIROCIN 2 % EX OINT
TOPICAL_OINTMENT | CUTANEOUS | Status: AC
  Administered 2017-04-07: 1 via TOPICAL
  Filled 2017-04-07: qty 22

## 2017-04-07 MED ORDER — HEPARIN (PORCINE) IN NACL 2-0.9 UNIT/ML-% IJ SOLN
INTRAMUSCULAR | Status: DC | PRN
  Administered 2017-04-07: 12:00:00

## 2017-04-07 MED ORDER — SODIUM CHLORIDE 0.9 % IR SOLN
80.0000 mg | Status: AC
  Administered 2017-04-07: 80 mg

## 2017-04-07 MED ORDER — SODIUM CHLORIDE 0.9 % IV SOLN: 500.0000 mL | INTRAVENOUS | Status: DC

## 2017-04-07 MED ORDER — MIDAZOLAM HCL 5 MG/5ML IJ SOLN
INTRAMUSCULAR | Status: AC
  Filled 2017-04-07: qty 5

## 2017-04-07 MED ORDER — VITAMIN B-12 1000 MCG PO TABS
1000.0000 ug | ORAL_TABLET | Freq: Every day | ORAL | Status: DC
  Administered 2017-04-08 – 2017-04-09 (×2): 1000 ug via ORAL
  Filled 2017-04-07 (×2): qty 1

## 2017-04-07 MED ORDER — IOPAMIDOL (ISOVUE-370) INJECTION 76%
INTRAVENOUS | Status: AC
  Filled 2017-04-07: qty 50

## 2017-04-07 MED ORDER — IOPAMIDOL (ISOVUE-370) INJECTION 76%
INTRAVENOUS | Status: DC | PRN
  Administered 2017-04-07: 15 mL via INTRAVENOUS

## 2017-04-07 MED ORDER — HEPARIN (PORCINE) IN NACL 2-0.9 UNIT/ML-% IJ SOLN
INTRAMUSCULAR | Status: AC
  Filled 2017-04-07: qty 500

## 2017-04-07 MED ORDER — DUTASTERIDE 0.5 MG PO CAPS
0.5000 mg | ORAL_CAPSULE | Freq: Every evening | ORAL | Status: DC
  Administered 2017-04-07: 0.5 mg via ORAL
  Filled 2017-04-07 (×2): qty 1

## 2017-04-07 MED ORDER — LIDOCAINE HCL (PF) 1 % IJ SOLN
INTRAMUSCULAR | Status: DC | PRN
  Administered 2017-04-07: 45 mL via INTRADERMAL

## 2017-04-07 MED ORDER — ALFUZOSIN HCL ER 10 MG PO TB24
10.0000 mg | ORAL_TABLET | Freq: Every evening | ORAL | Status: DC
  Administered 2017-04-07: 10 mg via ORAL
  Filled 2017-04-07 (×2): qty 1

## 2017-04-07 MED ORDER — ASPIRIN 81 MG PO CHEW: 81.0000 mg | CHEWABLE_TABLET | Freq: Every day | ORAL | Status: DC

## 2017-04-07 MED ORDER — VITAMIN B-12 1000 MCG PO TABS
500.0000 ug | ORAL_TABLET | Freq: Every day | ORAL | Status: DC
  Administered 2017-04-08 – 2017-04-09 (×2): 500 ug via ORAL
  Filled 2017-04-07 (×4): qty 1

## 2017-04-07 MED ORDER — LIDOCAINE HCL (PF) 1 % IJ SOLN
INTRAMUSCULAR | Status: AC
  Filled 2017-04-07: qty 60

## 2017-04-07 MED ORDER — SODIUM CHLORIDE 0.9 % IR SOLN
Status: AC
  Filled 2017-04-07: qty 2

## 2017-04-07 MED ORDER — ACETAMINOPHEN 325 MG PO TABS: 325.0000 mg | ORAL_TABLET | Freq: Three times a day (TID) | ORAL | Status: DC | PRN

## 2017-04-07 MED ORDER — ADULT MULTIVITAMIN W/MINERALS CH
1.0000 | ORAL_TABLET | Freq: Every day | ORAL | Status: DC
  Administered 2017-04-08 – 2017-04-09 (×2): 1 via ORAL
  Filled 2017-04-07 (×3): qty 1

## 2017-04-07 MED ORDER — VANCOMYCIN HCL IN DEXTROSE 1-5 GM/200ML-% IV SOLN
INTRAVENOUS | Status: AC
  Filled 2017-04-07: qty 200

## 2017-04-07 MED ORDER — METOPROLOL SUCCINATE ER 50 MG PO TB24
50.0000 mg | ORAL_TABLET | Freq: Every day | ORAL | Status: DC
  Administered 2017-04-09: 50 mg via ORAL
  Filled 2017-04-07 (×3): qty 1

## 2017-04-07 MED ORDER — FUROSEMIDE 20 MG PO TABS
20.0000 mg | ORAL_TABLET | Freq: Every day | ORAL | Status: DC
Start: 2017-04-07 — End: 2017-04-09
  Administered 2017-04-08 – 2017-04-09 (×2): 20 mg via ORAL
  Filled 2017-04-07 (×3): qty 1

## 2017-04-07 MED ORDER — FENTANYL CITRATE (PF) 100 MCG/2ML IJ SOLN
INTRAMUSCULAR | Status: DC | PRN
  Administered 2017-04-07 (×4): 25 ug via INTRAVENOUS

## 2017-04-07 SURGICAL SUPPLY — 7 items
CABLE SURGICAL S-101-97-12 (CABLE) ×2 IMPLANT
HEMOSTAT SURGICEL 2X4 FIBR (HEMOSTASIS) ×2 IMPLANT
ICD ELLIPSE DR CD2411-36Q (ICD Generator) ×2 IMPLANT
LEAD TENDRIL MRI 52CM LPA1200M (Lead) ×2 IMPLANT
PAD DEFIB LIFELINK (PAD) ×2 IMPLANT
SHEATH CLASSIC 8F (SHEATH) ×2 IMPLANT
TRAY PACEMAKER INSERTION (PACKS) ×2 IMPLANT

## 2017-04-07 NOTE — Interval H&P Note (Signed)
ICD Criteria  Current LVEF:50-55%. Within 12 months prior to implant: Yes   Heart failure history: Yes, Class III  Cardiomyopathy history: Yes, Ischemic Cardiomyopathy - Prior MI.  Atrial Fibrillation/Atrial Flutter: No.  Ventricular tachycardia history: Yes, Hemodynamic instability present. VT Type: Sustained Ventricular Tachycardia - Polymorphic.  Cardiac arrest history: Yes, Ventricular Fibrillation.  History of syndromes with risk of sudden death: No.  Previous ICD: Yes, Reason for ICD:  Secondary prevention.  Current ICD indication: Secondary  PPM indication: Yes. Pacing type: Atrial. Greater than 40% RV pacing requirement anticipated. Indication: Sick Sinus Syndrome   Class I or II Bradycardia indication present: Yes  Beta Blocker therapy for 3 or more months: Yes, prescribed.   Ace Inhibitor/ARB therapy for 3 or more months: Yes, prescribed.   History and Physical Interval Note:  04/07/2017 10:51 AM  [name] Christensen  has presented today for surgery, with the diagnosis of bradycardia  The various methods of treatment have been discussed with the patient and family. After consideration of risks, benefits and other options for treatment, the patient has consented to  Procedure(s): ICD Generator Changeout (N/A) RA Lead Insertion (N/A) as a surgical intervention .  The patient's history has been reviewed, patient examined, no change in status, stable for surgery.  I have reviewed the patient's chart and labs.  Questions were answered to the patient's satisfaction.     Virl Axe

## 2017-04-07 NOTE — Discharge Summary (Signed)
ELECTROPHYSIOLOGY PROCEDURE DISCHARGE SUMMARY    Patient ID: [name] Christensen,  MRN: [id number], DOB/AGE: 01/26/1932 83 y.o.  Admit date: 04/07/2017 Discharge date: 04/08/17  Primary Care Physician: Cassandria Anger, MD  Primary Cardiologist: Dr. Johnsie Cancel Electrophysiologist: Dr. [name]  Primary Discharge Diagnosis:  1. ICD generator at elective replacement I ndicator 2. Symptomatic bradycardia  Secondary Discharge Diagnosis:  1. Hx of cardiac arrest w/ICD 2. PVC's on amiodarone 3. ICM, Chronic CHF (systolic) 4. CAD  Allergies  Allergen Reactions  . Ezetimibe Other (See Comments)    Pt was in coma for 6 days   numbness  . Niacin     REACTION: "makes me feel nervous, jittery"  . Penicillins Other (See Comments)    Blisters between fingers @ 15 Has patient had a PCN reaction causing immediate rash, facial/tongue/throat swelling, SOB or lightheadedness with hypotension: unknown Has patient had a PCN reaction causing severe rash involving mucus membranes or skin necrosis: unknown Has patient had a PCN reaction that required hospitalization no Has patient had a PCN reaction occurring within the last 10 years: no If all of the above answers are "NO", then may proceed with Cephalosporin use.   . Pravastatin Sodium Other (See Comments)    Aches and pains in joints  . Rosuvastatin     REACTION: aches     Procedures This Admission:  1.  Implantation of a RA lead and generator replacement of the ICD on 04/07/17 by Dr [name].  The patient received a atrial lead St Jude LPA 1200 DBN [id number] and a St Jude pulse generator, serial number [id number].   DFT's were deferred at time of implant.  There were no immediate post procedure complications. 2.  CXR on 04/08/17 noted RA lead had dislodged, could not r/o tiny ptx and pleural effusion 3. 04/08/17: RA lead removed with new RA lead placement, Medtronic MRI compatible 5076 atrial lead serial number [id number]  4. 04/09/17 CXR does not  note/mention any finding of pneumothorax   Brief HPI: [name] Christensen is a 83 y.o. male was referred to electrophysiology in the outpatient setting for consideration of ICD implantation.  Past medical history includes ICM, cardiac arrest w/ICD that reached ERI, CAD, PVC's on amiodarone with resultant bradycardia and increased RV pacing.  The patient has persistent LV dysfunction despite guideline directed therapy.  Risks, benefits, and alternatives to ICD implantation were reviewed with the patient who wished to proceed.   Hospital Course:  The patient was admitted and underwent implantation of a RA lead and generator replacement with details as outlined above. Post implant day #1, cxr noted RA lead had dislodged, also mentioned could not r/o tiny ptx and pleural effusion, though VSS/sats were stable without patient c/o of any kind of SOB, no need for intervention.  Respiratory status remained stable.  04/08/17 the patient underwent removal; of the RA lead with new RA lead as noted above by Dr.Klein. He was monitored on telemetry overnight which demonstrated SR, occasional A and V paced rhythm.  Left chest was without hematoma or ecchymosis.  The device was interrogated and found to be functioning normally.  Repeat CXR the morning of discharge was obtained post lead reposition and no pneumothorax was described, atalectatic changes described, patient was aencouraged to be OOB, ambulate, he has no pulmonary complaints.  Wound care, arm mobility, and restrictions were reviewed with the patient.  The patient was examined by Dr. [name] and considered stable for discharge to home.  Physical Exam: Vitals:   04/08/17 2123 04/08/17 2329 04/09/17 0146 04/09/17 0428  BP: (!) 136/55 (!) 126/57 (!) 133/52 (!) 161/71  Pulse: 65 (!) 59 64 63  Resp: 19 18 18 17   Temp: 98.2 F (36.8 C) 98.8 F (37.1 C) 98.4 F (36.9 C) 98.1 F (36.7 C)  TempSrc: Oral Oral Oral Oral  SpO2: 95% 91% 92% 93%  Weight:    140 lb  14.4 oz (63.9 kg)  Height:        GEN- The patient is well appearing, alert and oriented x 3 today.   HEENT: normocephalic, atraumatic; sclera clear, conjunctiva pink; hearing intact; oropharynx clear Lungs-  CTA b/l, normal work of breathing.  No wheezes, rales, rhonchi Heart- RRR, no murmurs, rubs or gallops, PMI not laterally displaced GI- soft, non-tender, non-distended Extremities- no clubbing, cyanosis, or edema MS- no significant deformity or atrophy Skin- warm and dry, no rash or lesion, left chest without hematoma/ecchymosis Psych- euthymic mood, full affect Neuro- no gross defecits  Labs:   Lab Results  Component Value Date   WBC 5.9 04/02/2017   HGB 12.6 (L) 01/22/2017   HCT 31.2 (L) 04/02/2017   MCV 86 04/02/2017   PLT 162 04/02/2017   No results for input(s): NA, K, CL, CO2, BUN, CREATININE, CALCIUM, PROT, BILITOT, ALKPHOS, ALT, AST, GLUCOSE in the last 168 hours.  Invalid input(s): LABALBU  Discharge Medications:  Allergies as of 04/09/2017      Reactions   Ezetimibe Other (See Comments)   Pt was in coma for 6 days   numbness   Niacin    REACTION: "makes me feel nervous, jittery"   Penicillins Other (See Comments)   Blisters between fingers @ 15 Has patient had a PCN reaction causing immediate rash, facial/tongue/throat swelling, SOB or lightheadedness with hypotension: unknown Has patient had a PCN reaction causing severe rash involving mucus membranes or skin necrosis: unknown Has patient had a PCN reaction that required hospitalization no Has patient had a PCN reaction occurring within the last 10 years: no If all of the above answers are "NO", then may proceed with Cephalosporin use.   Pravastatin Sodium Other (See Comments)   Aches and pains in joints   Rosuvastatin    REACTION: aches      Medication List    TAKE these medications   acetaminophen 325 MG tablet Commonly known as:  TYLENOL Take 325 mg by mouth 3 (three) times daily as needed for  mild pain.   alfuzosin 10 MG 24 hr tablet Commonly known as:  UROXATRAL Take 10 mg by mouth every evening.   amiodarone 200 MG tablet Commonly known as:  PACERONE Take one tablet (200 mg) by mouth once daily   aspirin EC 81 MG tablet Take 81 mg by mouth every evening.   aspirin 81 MG chewable tablet Chew 1 tablet (81 mg total) by mouth daily.   atenolol 50 MG tablet Commonly known as:  TENORMIN TAKE ONE (1) TABLET BY MOUTH EVERY DAY What changed:  See the new instructions.   atorvastatin 20 MG tablet Commonly known as:  LIPITOR Take 1 tablet (20 mg total) by mouth daily at 6 PM.   betamethasone dipropionate 0.05 % cream Commonly known as:  DIPROLENE Apply topically 2 (two) times daily. What changed:  how much to take  when to take this  reasons to take this   clopidogrel 75 MG tablet Commonly known as:  PLAVIX TAKE ONE (1) TABLET BY MOUTH EVERY DAY  clotrimazole-betamethasone cream Commonly known as:  LOTRISONE Apply 1 application topically 2 (two) times daily. What changed:  when to take this  reasons to take this   diazepam 5 MG tablet Commonly known as:  VALIUM Take 0.5-1 tablets (2.5-5 mg total) by mouth every 12 (twelve) hours as needed for anxiety.   dutasteride 0.5 MG capsule Commonly known as:  AVODART Take 0.5 mg by mouth every evening.   furosemide 20 MG tablet Commonly known as:  LASIX Take 1 tablet (20 mg total) by mouth daily.   hydrocortisone 2.5 % rectal cream Commonly known as:  ANUSOL-HC bid What changed:  how much to take  how to take this  when to take this  reasons to take this  additional instructions   hydrocortisone 25 MG suppository Commonly known as:  ANUSOL-HC Use 1 rectal  At bedtime as needed   linaclotide 290 MCG Caps capsule Commonly known as:  LINZESS Take 1 capsule (290 mcg total) by mouth daily. What changed:  when to take this  reasons to take this   losartan 50 MG tablet Commonly known as:   COZAAR Take 1 tablet (50 mg total) by mouth daily.   MIRALAX PO Take 17 g by mouth daily as needed (constipation).   multivitamin with minerals Tabs tablet Take 1 tablet by mouth daily.   nitroGLYCERIN 0.4 MG SL tablet Commonly known as:  NITROSTAT PLACE ONE TABLET UNDER TONGUE EVERY 5 MINUTES AS NEEDED FOR CHEST PAIN (3 DOSES MAX)   vitamin B-12 1000 MCG tablet Commonly known as:  CYANOCOBALAMIN Take 1,000 mcg by mouth daily.   vitamin B-12 500 MCG tablet Commonly known as:  CYANOCOBALAMIN Take 500 mcg by mouth daily.   Vitamin D3 400 units Caps Take 400 Units by mouth daily.       Disposition:  Home Discharge Instructions    Diet - low sodium heart healthy    Complete by:  As directed    Increase activity slowly    Complete by:  As directed      Follow-up Information    [place] Follow up on 04/22/2017.   Specialty:  Cardiology Why:  10:00AM, wound check Contact information: [address] [place]       Deboraha Sprang, MD Follow up on 07/08/2017.   Specialty:  Cardiology Why:  1:45PM Contact information: [address] [place] [address] [phone]           Duration of Discharge Encounter: Greater than 30 minutes including physician time.  Venetia Night, PA-C 04/09/2017 7:55 AM

## 2017-04-07 NOTE — H&P (View-Only) (Signed)
Electrophysiology Office Note Date: 03/25/2017  ID:  [name] Christensen, DOB 08/08/1932, MRN [id number]  PCP: [name], MD Primary Cardiologist: [name] Electrophysiologist: Graciela Husbands   CC: bradycardia follow up  [name] Christensen is a 83 y.o. male seen today for Dr Graciela Husbands.  He was recently started on amiodarone for increased PVC burden and has had resultant bradycardia. He presents today for follow up.   Since last being seen in our clinic, the patient reports increased fatigue and exercise intolerance.  He denies chest pain, palpitations, dyspnea, PND, orthopnea, nausea, vomiting, dizziness, syncope, edema, weight gain, or early satiety.  He has not had ICD shocks.   Device History: STJ single chamber ICD implanted 2013 for cardiac arrest History of appropriate therapy: No History of AAD therapy: Yes - amiodarone for PVC's    Past Medical History:  Diagnosis Date  . Allergy   . BPH (benign prostatic hypertrophy)    elevated PSA Dr. Lindell Noe Bx 2010  . Cardiac arrest [facility])    a. out-of-hospital arrest 10/25/2012 - EF 40-45%, patent grafts on cath, received St. Jude AICD.  [name] Carotid disease, bilateral (HCC)    a. 0-39% by doppers.  . Cataract    bil cataracts removed  . CHF (congestive heart failure) (HCC)   . Coronary artery disease    a. s/p MI/CABG 2005. b. s/p cath at time of VF arrest 10/2013 - grafts patent.  . Elevated LFTs    a. 10/2012 felt due to cardiac arrest - Hepatitis C Ab reactive from 11/14/2012>>Hep C RNA PCR negative 10/25/2012.   [name] GERD (gastroesophageal reflux disease)   . Hyperlipidemia   . Hypertension   . Myocardial infarction (HCC)    2005 - CABG x 5 2005  . RBBB   . Ventricular bigeminy    a. Event monitor 01/2013: NSR with PVCs and occ bigeminy.   Past Surgical History:  Procedure Laterality Date  . APPENDECTOMY    . CARDIAC CATHETERIZATION  2007   with patent graft anatomy atretic left internal mammary  artery to the LAD which is nonobstructive. Will  restart study June 08, 2007  . CATARACT EXTRACTION W/PHACO Right 04/23/2016   Procedure: CATARACT EXTRACTION PHACO AND INTRAOCULAR LENS PLACEMENT (IOC);  Surgeon: [name], MD;  Location: [facility] ORS;  Service: Ophthalmology;  Laterality: Right;  [place] 1.09 AP% 19.3 CDE 13.38 Fluid pack lot # [id number]  . COLONOSCOPY    . CORONARY ARTERY BYPASS GRAFT  2005  . EYE SURGERY    . IMPLANTABLE CARDIOVERTER DEFIBRILLATOR IMPLANT N/A 11/24/2012   STJ single chamber ICD implanted by Dr Graciela Husbands for cardiac arrest   . INGUINAL HERNIA REPAIR Bilateral 01/17/2015   Procedure: BILATERAL LAPAROSCOPIC INGUINAL HERNIA REPAIR WITH LEFT FEMORAL HERNIA REPAIR;  Surgeon: [name], MD;  Location: [facility] OR;  Service: General;  Laterality: Bilateral;  . INSERTION OF MESH Bilateral 01/17/2015   Procedure: INSERTION OF MESH;  Surgeon: [name], MD;  Location: [facility] OR;  Service: General;  Laterality: Bilateral;  . LEFT HEART CATHETERIZATION WITH CORONARY/GRAFT ANGIOGRAM N/A 11/18/2012   Procedure: LEFT HEART CATHETERIZATION WITH Isabel Caprice;  Surgeon: [name], MD;  Location: [facility] CATH LAB;  Service: Cardiovascular;  Laterality: N/A;  . LUMBAR FUSION  09/2007    Current Outpatient Prescriptions  Medication Sig Dispense Refill  . alfuzosin (UROXATRAL) 10 MG 24 hr tablet Take 10 mg by mouth every evening.     [name] amiodarone (PACERONE) 200 MG tablet Take one tablet (200 mg) by  mouth once daily 30 tablet 6  . aspirin 81 MG chewable tablet Chew 1 tablet (81 mg total) by mouth daily.    [name] atenolol (TENORMIN) 50 MG tablet TAKE ONE (1) TABLET BY MOUTH EVERY DAY 90 tablet 2  . atorvastatin (LIPITOR) 20 MG tablet Take 1 tablet (20 mg total) by mouth daily at 6 PM. 90 tablet 3  . betamethasone dipropionate (DIPROLENE) 0.05 % cream Apply topically 2 (two) times daily. 30 g 3  . Cholecalciferol (VITAMIN D3) 1000 UNITS CAPS Take 1 capsule by mouth daily.      . clopidogrel (PLAVIX) 75 MG tablet TAKE ONE (1) TABLET  BY MOUTH EVERY DAY 30 tablet 5  . clotrimazole-betamethasone (LOTRISONE) cream Apply 1 application topically 2 (two) times daily. 45 g 3  . diazepam (VALIUM) 5 MG tablet Take 0.5-1 tablets (2.5-5 mg total) by mouth every 12 (twelve) hours as needed for anxiety. 30 tablet 2  . dutasteride (AVODART) 0.5 MG capsule Take 0.5 mg by mouth daily.    . furosemide (LASIX) 20 MG tablet Take 1 tablet (20 mg total) by mouth daily. 30 tablet 1  . hydrocortisone (ANUSOL-HC) 2.5 % rectal cream bid 30 g 1  . hydrocortisone (ANUSOL-HC) 25 MG suppository Use 1 rectal  At bedtime as needed 30 suppository 6  . linaclotide (LINZESS) 290 MCG CAPS capsule Take 1 capsule (290 mcg total) by mouth daily. 30 capsule 11  . losartan (COZAAR) 50 MG tablet Take 1 tablet (50 mg total) by mouth daily. 90 tablet 1  . Multiple Vitamin (MULTIVITAMIN WITH MINERALS) TABS Take 1 tablet by mouth daily.    . nitroGLYCERIN (NITROSTAT) 0.4 MG SL tablet PLACE ONE TABLET UNDER TONGUE EVERY 5 MINUTES AS NEEDED FOR CHEST PAIN (3 DOSES MAX) 25 tablet 0  . Polyethylene Glycol 3350 (MIRALAX PO) Take by mouth.    . ranitidine (ZANTAC) 150 MG capsule Take 150 mg by mouth as needed for heartburn.     . vitamin B-12 (CYANOCOBALAMIN) 1000 MCG tablet Take 1,000 mcg by mouth daily.     Current Facility-Administered Medications  Medication Dose Route Frequency Provider Last Rate Last Dose  . 0.9 %  sodium chloride infusion  500 mL Intravenous Continuous Hilarie Fredrickson, MD        Allergies:   Ezetimibe; Niacin; Penicillins; Pravastatin sodium; and Rosuvastatin   Social History: Social History   Social History  . Marital status: Married    Spouse name: N/A  . Number of children: 5  . Years of education: N/A   Occupational History  . Realtor [employer]   Social History Main Topics  . Smoking status: Never Smoker  . Smokeless tobacco: Never Used  . Alcohol use No  . Drug use: No  . Sexual activity: Not on file   Other Topics Concern    . Not on file   Social History Narrative  . No narrative on file    Family History: Family History  Problem Relation Age of Onset  . Coronary artery disease Mother   . Heart attack Brother   . Stomach cancer Neg Hx   . Colon cancer Neg Hx   . Esophageal cancer Neg Hx   . Pancreatic cancer Neg Hx   . Prostate cancer Neg Hx   . Rectal cancer Neg Hx     Review of Systems: All other systems reviewed and are otherwise negative except as noted above.   Physical Exam: VS:  BP (!) 138/58   Pulse [name])  48   Ht 5\' 7"  (1.702 m)   Wt 156 lb (70.8 kg)   SpO2 97%   BMI 24.43 kg/m  , BMI Body mass index is 24.43 kg/m.  GEN- The patient is elderly appearing, alert and oriented x 3 today.   HEENT: normocephalic, atraumatic; sclera clear, conjunctiva pink; hearing intact; oropharynx clear; neck supple  Lungs- Clear to ausculation bilaterally, normal work of breathing.  No wheezes, rales, rhonchi Heart- Bradycardic regular rate and rhythm with frequent ectopy  GI- soft, non-tender, non-distended, bowel sounds present  Extremities- no clubbing, cyanosis, or edema  MS- no significant deformity or atrophy Skin- warm and dry, no rash or lesion; ICD pocket well healed Psych- euthymic mood, full affect Neuro- strength and sensation are intact  ICD interrogation- reviewed in detail today,  See PACEART report  EKG:  EKG is not ordered today.  Recent Labs: 01/22/2017: Hemoglobin 12.6; Platelets 96.0; TSH 3.250 02/06/2017: BUN 13; Creatinine, Ser 1.12; Magnesium 1.9; Potassium 4.7; Sodium 127 02/12/2017: ALT 24   Wt Readings from Last 3 Encounters:  03/25/17 156 lb (70.8 kg)  03/18/17 151 lb (68.5 kg)  02/24/17 153 lb (69.4 kg)     Other studies Reviewed: Additional studies/ records that were reviewed today include: Dr Odessa Fleming office notes   Assessment and Plan:  1.  Cardiac arrest  Normal ICD function See Pace Art report No changes today  2. Chronic systolic  dysfunction euvolemic today Stable on an appropriate medical regimen  3.  PVC's Still with significant burden by histograms despite amiodarone Continue amiodarone for now. Unable to up-titrate 2/2 bradycardia TSH, LFT's recently normal   4.  Sinus bradycardia/RBBB/1st degree AV block RV pacing 20% by device interrogation today He is symptomatic with fatigue and exercise intolerance We have discussed upgrade to dual chamber ICD today. Risks, benefits reviewed with patient who wishes to proceed. Will hold Plavix for 3 days prior to procedure.   5.  CAD  Stable No change required today    Current medicines are reviewed at length with the patient today.   The patient does not have concerns regarding his medicines.  The following changes were made today:  none  Labs/ tests ordered today include: preprocedure labs  Orders Placed This Encounter  Procedures  . CUP PACEART INCLINIC DEVICE CHECK     Disposition:   Follow up with Dr Graciela Husbands after ICD upgrade    Signed, Gypsy Balsam, NP 03/25/2017 10:05 AM  [facility] [address] [place] [address] [phone] (office) [phone] (fax)

## 2017-04-08 ENCOUNTER — Encounter (HOSPITAL_COMMUNITY): Payer: Self-pay | Admitting: Certified Registered Nurse Anesthetist

## 2017-04-08 ENCOUNTER — Encounter (HOSPITAL_COMMUNITY)
Admission: RE | Disposition: A | Discharge status: Home / Self Care | Payer: Self-pay | Source: Ambulatory Visit | Attending: Internal Medicine

## 2017-04-08 ENCOUNTER — Ambulatory Visit (HOSPITAL_COMMUNITY): Payer: Medicare Other

## 2017-04-08 DIAGNOSIS — I5022 Chronic systolic (congestive) heart failure: Secondary | ICD-10-CM | POA: Diagnosis not present

## 2017-04-08 DIAGNOSIS — T82120A Displacement of cardiac electrode, initial encounter: Secondary | ICD-10-CM | POA: Diagnosis not present

## 2017-04-08 DIAGNOSIS — I2510 Atherosclerotic heart disease of native coronary artery without angina pectoris: Secondary | ICD-10-CM | POA: Diagnosis not present

## 2017-04-08 DIAGNOSIS — I517 Cardiomegaly: Secondary | ICD-10-CM | POA: Diagnosis not present

## 2017-04-08 DIAGNOSIS — I440 Atrioventricular block, first degree: Secondary | ICD-10-CM | POA: Diagnosis not present

## 2017-04-08 DIAGNOSIS — I4510 Unspecified right bundle-branch block: Secondary | ICD-10-CM | POA: Diagnosis not present

## 2017-04-08 DIAGNOSIS — E785 Hyperlipidemia, unspecified: Secondary | ICD-10-CM | POA: Diagnosis not present

## 2017-04-08 DIAGNOSIS — I255 Ischemic cardiomyopathy: Secondary | ICD-10-CM | POA: Diagnosis not present

## 2017-04-08 DIAGNOSIS — I252 Old myocardial infarction: Secondary | ICD-10-CM | POA: Diagnosis not present

## 2017-04-08 DIAGNOSIS — I495 Sick sinus syndrome: Secondary | ICD-10-CM | POA: Diagnosis not present

## 2017-04-08 DIAGNOSIS — Z4502 Encounter for adjustment and management of automatic implantable cardiac defibrillator: Secondary | ICD-10-CM | POA: Diagnosis not present

## 2017-04-08 DIAGNOSIS — Z951 Presence of aortocoronary bypass graft: Secondary | ICD-10-CM | POA: Diagnosis not present

## 2017-04-08 DIAGNOSIS — I110 Hypertensive heart disease with heart failure: Secondary | ICD-10-CM | POA: Diagnosis not present

## 2017-04-08 HISTORY — PX: LEAD REVISION/REPAIR: EP1213

## 2017-04-08 SURGERY — LEAD REVISION/REPAIR

## 2017-04-08 MED ORDER — SODIUM CHLORIDE 0.9 % IR SOLN
80.0000 mg | Status: AC
  Administered 2017-04-08: 80 mg
  Filled 2017-04-08: qty 2

## 2017-04-08 MED ORDER — LIDOCAINE HCL (PF) 1 % IJ SOLN
INTRAMUSCULAR | Status: DC | PRN
  Administered 2017-04-08: 45 mL

## 2017-04-08 MED ORDER — HEPARIN (PORCINE) IN NACL 2-0.9 UNIT/ML-% IJ SOLN
INTRAMUSCULAR | Status: AC
  Filled 2017-04-08: qty 500

## 2017-04-08 MED ORDER — FENTANYL CITRATE (PF) 100 MCG/2ML IJ SOLN
INTRAMUSCULAR | Status: DC | PRN
  Administered 2017-04-08: 25 ug via INTRAVENOUS
  Administered 2017-04-08: 50 ug via INTRAVENOUS

## 2017-04-08 MED ORDER — FENTANYL CITRATE (PF) 100 MCG/2ML IJ SOLN
INTRAMUSCULAR | Status: AC
  Filled 2017-04-08: qty 2

## 2017-04-08 MED ORDER — ACETAMINOPHEN 325 MG PO TABS
325.0000 mg | ORAL_TABLET | ORAL | Status: DC | PRN
  Administered 2017-04-08 – 2017-04-09 (×3): 650 mg via ORAL
  Filled 2017-04-08 (×3): qty 2

## 2017-04-08 MED ORDER — SODIUM CHLORIDE 0.9 % IV SOLN: INTRAVENOUS | Status: DC

## 2017-04-08 MED ORDER — CHLORHEXIDINE GLUCONATE 4 % EX LIQD: 60.0000 mL | Freq: Once | CUTANEOUS | Status: DC

## 2017-04-08 MED ORDER — LIDOCAINE HCL (PF) 1 % IJ SOLN
INTRAMUSCULAR | Status: AC
  Filled 2017-04-08: qty 60

## 2017-04-08 MED ORDER — VANCOMYCIN HCL IN DEXTROSE 1-5 GM/200ML-% IV SOLN
INTRAVENOUS | Status: AC
  Filled 2017-04-08: qty 200

## 2017-04-08 MED ORDER — VANCOMYCIN HCL IN DEXTROSE 1-5 GM/200ML-% IV SOLN: 1000.0000 mg | INTRAVENOUS | Status: DC

## 2017-04-08 MED ORDER — VANCOMYCIN HCL IN DEXTROSE 1-5 GM/200ML-% IV SOLN
1000.0000 mg | INTRAVENOUS | Status: AC
  Administered 2017-04-08: 1000 mg via INTRAVENOUS
  Filled 2017-04-08: qty 200

## 2017-04-08 MED ORDER — ONDANSETRON HCL 4 MG/2ML IJ SOLN: 4.0000 mg | Freq: Four times a day (QID) | INTRAMUSCULAR | Status: DC | PRN

## 2017-04-08 MED ORDER — SODIUM CHLORIDE 0.9 % IR SOLN
Status: AC
  Filled 2017-04-08: qty 2

## 2017-04-08 MED ORDER — VANCOMYCIN HCL IN DEXTROSE 1-5 GM/200ML-% IV SOLN
1000.0000 mg | Freq: Two times a day (BID) | INTRAVENOUS | Status: AC
  Administered 2017-04-09: 1000 mg via INTRAVENOUS
  Filled 2017-04-08: qty 200

## 2017-04-08 MED ORDER — MIDAZOLAM HCL 5 MG/5ML IJ SOLN
INTRAMUSCULAR | Status: AC
  Filled 2017-04-08: qty 5

## 2017-04-08 MED ORDER — HEPARIN (PORCINE) IN NACL 2-0.9 UNIT/ML-% IJ SOLN
INTRAMUSCULAR | Status: DC | PRN
  Administered 2017-04-08: 18:00:00

## 2017-04-08 MED ORDER — MIDAZOLAM HCL 5 MG/5ML IJ SOLN
INTRAMUSCULAR | Status: DC | PRN
  Administered 2017-04-08 (×2): 1 mg via INTRAVENOUS
  Administered 2017-04-08: 2 mg via INTRAVENOUS
  Administered 2017-04-08: 1 mg via INTRAVENOUS

## 2017-04-08 MED ORDER — SODIUM CHLORIDE 0.9 % IR SOLN: 80.0000 mg | Status: DC

## 2017-04-08 SURGICAL SUPPLY — 7 items
CABLE SURGICAL S-101-97-12 (CABLE) ×2 IMPLANT
HEMOSTAT SURGICEL 2X4 FIBR (HEMOSTASIS) ×2 IMPLANT
LEAD CAPSURE NOVUS 5076-52CM (Lead) ×2 IMPLANT
PAD DEFIB LIFELINK (PAD) ×2 IMPLANT
SET INTRODUCER MICROPUNCT 5F (INTRODUCER) ×2 IMPLANT
SHEATH CLASSIC 7F (SHEATH) ×2 IMPLANT
TRAY PACEMAKER INSERTION (PACKS) ×2 IMPLANT

## 2017-04-08 NOTE — Progress Notes (Signed)
SUBJECTIVE: The patient is feeling well today.  At this time, he denies chest pain, shortness of breath, or any new concerns Disappointed that atrial lead is dislodged.  [name] alfuzosin  10 mg Oral QPM  . aspirin EC  81 mg Oral QPM  . atorvastatin  20 mg Oral q1800  . cholecalciferol  400 Units Oral Daily  . dutasteride  0.5 mg Oral QPM  . furosemide  20 mg Oral Daily  . losartan  50 mg Oral Daily  . metoprolol succinate  50 mg Oral Daily  . multivitamin with minerals  1 tablet Oral Daily  . vitamin B-12  1,000 mcg Oral Daily  . vitamin B-12  500 mcg Oral Daily   . sodium chloride      OBJECTIVE: Physical Exam: Vitals:   04/07/17 1248 04/07/17 1250 04/07/17 2041 04/08/17 0551  BP: 139/67  (!) 127/57   Pulse: 64  64   Resp: 18  18   Temp:   98.2 F (36.8 C) (P) 97.8 F (36.6 C)  TempSrc:   Oral (P) Oral  SpO2: 95% 94% 96%   Weight:      Height:        Intake/Output Summary (Last 24 hours) at 04/08/17 7169 Last data filed at 04/08/17 0640  Gross per 24 hour  Intake                0 ml  Output             1575 ml  Net            -1575 ml    Telemetry is reviewed by myself, SR/ intermittent pacing  GEN- The patient is well appearing, alert and oriented x 3 today.   Head- normocephalic, atraumatic Eyes-  Sclera clear, conjunctiva pink Ears- hearing intact Oropharynx- clear Neck- supple, no JVP Device pocket well healed; without hematoma or erythema.  There is no tethering  Lungs- CTA, normal work of breathing Heart- RRR, no significant murmurs, no rubs or gallops GI- soft, NT, ND Extremities- no clubbing, cyanosis, or edema Skin- no rash or lesion Psych- euthymic mood, full affect Neuro- no gross deficits appreciated     RADIOLOGY: Dg Chest 2 View Result Date: 04/08/2017 CLINICAL DATA:  Pacemaker.  Soreness. EXAM: CHEST  2 VIEW COMPARISON:  04/09/2016, 11/30/2014, 07/19/2013. FINDINGS: Cardiac pacer noted with lead tip projected over the lower right  atrium/upper IVC and right ventricle. Clinical correlation suggested. Prior CABG. Cardiomegaly. No pulmonary venous congestion. Chronic interstitial prominence. Tiny left pleural effusion cannot be excluded. Tiny left apical pneumothorax cannot be completely excluded. IMPRESSION: 1. Cardiac pacer noted. Lead tips are present in the lower right atrium/ upper IVC and right ventricle. Clinical correlation suggested. Tiny left apical pneumothorax cannot be completely excluded. 2. Prior CABG.  Stable cardiomegaly. 3. Chronic interstitial lung disease. Tiny left pleural effusion cannot be excluded . Critical Value/emergent results were called by telephone at the time of interpretation on 04/08/2017 at 7:20 am to nurse Helene Kelp, who verbally acknowledged these results. Electronically Signed   By: Marcello Moores  Register   On: 04/08/2017 07:24    ASSESSMENT AND PLAN:   1. RA lead dislodgement     S/p RA lead placement yesterday 2/2 SB     Will need lead revision today, Der. [name] and discussed with the patient, risks/benefits and the patient is agreeable to proceed.  CXR noted, no c/o SOB, O2 sats are ok on RA. D/w Dr. [name], no need  for any intervention for cxr finding (cannot exclude tiny ptx) VSS, patient is feeling well      2. ICD generator had reached ERI     S/p generator change yesterday with Dr. [name]  3. ICM     Exam appears well compensated  4. CAD     No anginal complaints  Tommye Standard, Hershal Coria 04/08/2017 7:28 AM  As above Will need revision Orders written Risks outlined-- will anticipate lead replacement

## 2017-04-09 ENCOUNTER — Ambulatory Visit (HOSPITAL_COMMUNITY): Payer: Medicare Other

## 2017-04-09 ENCOUNTER — Encounter (HOSPITAL_COMMUNITY): Payer: Self-pay | Admitting: Internal Medicine

## 2017-04-09 DIAGNOSIS — I4510 Unspecified right bundle-branch block: Secondary | ICD-10-CM | POA: Diagnosis not present

## 2017-04-09 DIAGNOSIS — I495 Sick sinus syndrome: Secondary | ICD-10-CM | POA: Diagnosis not present

## 2017-04-09 DIAGNOSIS — I255 Ischemic cardiomyopathy: Secondary | ICD-10-CM | POA: Diagnosis not present

## 2017-04-09 DIAGNOSIS — Z4502 Encounter for adjustment and management of automatic implantable cardiac defibrillator: Secondary | ICD-10-CM | POA: Diagnosis not present

## 2017-04-09 DIAGNOSIS — I252 Old myocardial infarction: Secondary | ICD-10-CM | POA: Diagnosis not present

## 2017-04-09 DIAGNOSIS — I110 Hypertensive heart disease with heart failure: Secondary | ICD-10-CM | POA: Diagnosis not present

## 2017-04-09 DIAGNOSIS — J9811 Atelectasis: Secondary | ICD-10-CM | POA: Diagnosis not present

## 2017-04-09 DIAGNOSIS — I440 Atrioventricular block, first degree: Secondary | ICD-10-CM | POA: Diagnosis not present

## 2017-04-09 DIAGNOSIS — I2510 Atherosclerotic heart disease of native coronary artery without angina pectoris: Secondary | ICD-10-CM | POA: Diagnosis not present

## 2017-04-09 DIAGNOSIS — E785 Hyperlipidemia, unspecified: Secondary | ICD-10-CM | POA: Diagnosis not present

## 2017-04-09 DIAGNOSIS — Z951 Presence of aortocoronary bypass graft: Secondary | ICD-10-CM | POA: Diagnosis not present

## 2017-04-09 DIAGNOSIS — T82120A Displacement of cardiac electrode, initial encounter: Secondary | ICD-10-CM | POA: Diagnosis not present

## 2017-04-09 DIAGNOSIS — I5022 Chronic systolic (congestive) heart failure: Secondary | ICD-10-CM | POA: Diagnosis not present

## 2017-04-09 NOTE — Discharge Instructions (Signed)
° ° °  Supplemental Discharge Instructions for  Pacemaker/Defibrillator Patients  Activity No heavy lifting or vigorous activity with your left/right arm for 6 to 8 weeks.  Do not raise your left/right arm above your head for one week.  Gradually raise your affected arm as drawn below.             04/12/17                     04/13/17                    04/14/17                   04/15/17 __  NO DRIVING for  1 week   ; you may begin driving on   1/85/63  .  WOUND CARE - Keep the wound area clean and dry.  Do not get this area wet, no showers for 24 hours; you may shower on 04/10/17  - The tape/steri-strips on your wound will fall off; do not pull them off.  No bandage is needed on the site.  DO  NOT apply any creams, oils, or ointments to the wound area. - If you notice any drainage or discharge from the wound, any swelling or bruising at the site, or you develop a fever > 101? F after you are discharged home, call the office at once.  Special Instructions - You are still able to use cellular telephones; use the ear opposite the side where you have your pacemaker/defibrillator.  Avoid carrying your cellular phone near your device. - When traveling through airports, show security personnel your identification card to avoid being screened in the metal detectors.  Ask the security personnel to use the hand wand. - Avoid arc welding equipment, MRI testing (magnetic resonance imaging), TENS units (transcutaneous nerve stimulators).  Call the office for questions about other devices. - Avoid electrical appliances that are in poor condition or are not properly grounded. - Microwave ovens are safe to be near or to operate.  Additional information for defibrillator patients should your device go off: - If your device goes off ONCE and you feel fine afterward, notify the device clinic nurses. - If your device goes off ONCE and you do not feel well afterward, call 911. - If your device goes off TWICE, call  911. - If your device goes off THREE times in one day, call 911.  DO NOT DRIVE YOURSELF OR A FAMILY MEMBER WITH A DEFIBRILLATOR TO THE HOSPITAL--CALL 911.

## 2017-04-11 ENCOUNTER — Ambulatory Visit: Payer: Medicare Other

## 2017-04-16 ENCOUNTER — Other Ambulatory Visit: Payer: Medicare Other

## 2017-04-16 ENCOUNTER — Encounter (HOSPITAL_COMMUNITY)
Admission: RE | Admit: 2017-04-16 | Discharge: 2017-04-16 | Disposition: A | Discharge status: Home / Self Care | Payer: Medicare Other | Source: Ambulatory Visit | Attending: Surgery | Admitting: Surgery

## 2017-04-16 ENCOUNTER — Ambulatory Visit: Payer: Self-pay | Admitting: Surgery

## 2017-04-16 DIAGNOSIS — K828 Other specified diseases of gallbladder: Secondary | ICD-10-CM | POA: Insufficient documentation

## 2017-04-16 DIAGNOSIS — K8020 Calculus of gallbladder without cholecystitis without obstruction: Secondary | ICD-10-CM | POA: Diagnosis not present

## 2017-04-16 MED ORDER — TECHNETIUM TC 99M MEBROFENIN IV KIT
5.0000 | PACK | Freq: Once | INTRAVENOUS | Status: AC | PRN
  Administered 2017-04-16: 5 via INTRAVENOUS

## 2017-04-18 ENCOUNTER — Ambulatory Visit: Payer: Medicare Other | Admitting: Internal Medicine

## 2017-04-22 ENCOUNTER — Ambulatory Visit (INDEPENDENT_AMBULATORY_CARE_PROVIDER_SITE_OTHER): Payer: Medicare Other | Admitting: *Deleted

## 2017-04-22 DIAGNOSIS — I495 Sick sinus syndrome: Secondary | ICD-10-CM

## 2017-04-22 DIAGNOSIS — I259 Chronic ischemic heart disease, unspecified: Secondary | ICD-10-CM

## 2017-04-22 DIAGNOSIS — R001 Bradycardia, unspecified: Secondary | ICD-10-CM

## 2017-04-22 LAB — CUP PACEART INCLINIC DEVICE CHECK
Brady Statistic RA Percent Paced: 76 %
Brady Statistic RV Percent Paced: 94 %
Date Time Interrogation Session: 20180529114508
HighPow Impedance: 63 Ohm
Implantable Lead Implant Date: 20131218
Implantable Lead Implant Date: 20180514
Implantable Lead Location: 753859
Implantable Lead Location: 753860
Implantable Lead Model: 5076
Implantable Pulse Generator Implant Date: 20180514
Lead Channel Impedance Value: 412.5 Ohm
Lead Channel Impedance Value: 462.5 Ohm
Lead Channel Pacing Threshold Amplitude: 0.75 V
Lead Channel Pacing Threshold Amplitude: 0.75 V
Lead Channel Pacing Threshold Amplitude: 1.75 V
Lead Channel Pacing Threshold Amplitude: 1.75 V
Lead Channel Pacing Threshold Pulse Width: 0.5 ms
Lead Channel Pacing Threshold Pulse Width: 0.5 ms
Lead Channel Pacing Threshold Pulse Width: 0.5 ms
Lead Channel Pacing Threshold Pulse Width: 0.5 ms
Lead Channel Sensing Intrinsic Amplitude: 2.5 mV
Lead Channel Sensing Intrinsic Amplitude: 4.6 mV
Lead Channel Setting Pacing Amplitude: 1.875
Lead Channel Setting Pacing Amplitude: 3.5 V
Lead Channel Setting Pacing Pulse Width: 0.5 ms
Lead Channel Setting Sensing Sensitivity: 0.5 mV
Pulse Gen Serial Number: 1245762

## 2017-04-22 NOTE — Progress Notes (Signed)
Wound check appointment. Dermabond removed. Wound without redness or edema. Incision edges approximated, wound well healed. Normal device function. Thresholds, sensing, and impedances consistent with implant measurements. Device programmed at 3.5V (RA) for extra safety margin until 3 month visit, RV auto cap turned on at implant. Histogram distribution appeared blunted for patient's stated activity level+ sx's, RR turned on this ov at nominal settings (76%Ap). No mode switches or ventricular arrhythmias noted. Patient educated about wound care, arm mobility, lifting restrictions, shock plan. ROV in 3 months with SK.

## 2017-04-23 ENCOUNTER — Telehealth: Payer: Self-pay | Admitting: *Deleted

## 2017-04-23 NOTE — Progress Notes (Deleted)
Subjective:   [name] Christensen is a 83 y.o. male who presents for Medicare Annual/Subsequent preventive examination.  Review of Systems:  No ROS.  Medicare Wellness Visit.    Sleep patterns: {SX; SLEEP PATTERNS:18802::"feels rested on waking","does not get up to void","gets up *** times nightly to void","sleeps *** hours nightly"}.   Home Safety/Smoke Alarms:   Living environment; residence and Firearm Safety: {Rehab home environment / accessibility:30080::"no firearms","firearms stored safely"}. Seat Belt Safety/Bike Helmet: Wears seat belt.   Counseling:   Eye Exam-  Dental-  Male:   CCS- N/A   PSA-  Lab Results  Component Value Date   PSA 12.45 (H) 02/06/2016   PSA 10.39 (H) 08/03/2014   PSA 7.17 (H) 01/12/2013       Objective:    Vitals: There were no vitals taken for this visit.  There is no height or weight on file to calculate BMI.  Tobacco History  Smoking Status  . Never Smoker  Smokeless Tobacco  . Never Used     Counseling given: Not Answered   Past Medical History:  Diagnosis Date  . AICD (automatic cardioverter/defibrillator) present 04/07/2017  . Allergy   . BPH (benign prostatic hypertrophy)    elevated PSA Dr. Lindell Noe Bx 2010  . Cardiac arrest [facility])    a. out-of-hospital arrest 11/18/2012 - EF 40-45%, patent grafts on cath, received [employer] AICD.  [name] Carotid disease, bilateral (HCC)    a. 0-39% by doppers.  . Cataract    bil cataracts removed  . CHF (congestive heart failure) (HCC)   . Coronary artery disease    a. s/p MI/CABG 2005. b. s/p cath at time of VF arrest 10/2013 - grafts patent.  . Elevated LFTs    a. 10/2012 felt due to cardiac arrest - Hepatitis C Ab reactive from 10/25/2012>>Hep C RNA PCR negative 11/27/2012.   [name] GERD (gastroesophageal reflux disease)   . Hyperlipidemia   . Hypertension   . Myocardial infarction (HCC)    2005 - CABG x 5 2005  . RBBB   . Ventricular bigeminy    a. Event monitor 01/2013: NSR with PVCs and occ  bigeminy.   Past Surgical History:  Procedure Laterality Date  . APPENDECTOMY    . CARDIAC CATHETERIZATION  2007   with patent graft anatomy atretic left internal mammary  artery to the LAD which is nonobstructive. Will restart study June 08, 2007  . CATARACT EXTRACTION W/PHACO Right 04/23/2016   Procedure: CATARACT EXTRACTION PHACO AND INTRAOCULAR LENS PLACEMENT (IOC);  Surgeon: [name], MD;  Location: [facility] ORS;  Service: Ophthalmology;  Laterality: Right;  [place] 1.09 AP% 19.3 CDE 13.38 Fluid pack lot # [id number]  . COLONOSCOPY    . CORONARY ARTERY BYPASS GRAFT  2005  . EYE SURGERY    . ICD GENERATOR CHANGEOUT N/A 04/07/2017   Procedure: ICD Generator Changeout;  Surgeon: Duke Salvia, MD;  Location: [facility] INVASIVE CV LAB;  Service: Cardiovascular;  Laterality: N/A;  . IMPLANTABLE CARDIOVERTER DEFIBRILLATOR IMPLANT N/A 11/19/2012   STJ single chamber ICD implanted by Dr Graciela Husbands for cardiac arrest   . INGUINAL HERNIA REPAIR Bilateral 01/17/2015   Procedure: BILATERAL LAPAROSCOPIC INGUINAL HERNIA REPAIR WITH LEFT FEMORAL HERNIA REPAIR;  Surgeon: [name], MD;  Location: [facility] OR;  Service: General;  Laterality: Bilateral;  . INSERTION OF MESH Bilateral 01/17/2015   Procedure: INSERTION OF MESH;  Surgeon: [name], MD;  Location: [facility] OR;  Service: General;  Laterality: Bilateral;  . LEAD INSERTION N/A  04/07/2017   Procedure: RA Lead Insertion;  Surgeon: Duke Salvia, MD;  Location: [facility] INVASIVE CV LAB;  Service: Cardiovascular;  Laterality: N/A;  . LEAD REVISION/REPAIR N/A 04/08/2017   Procedure: Atrial Lead Revision/Repair;  Surgeon: Duke Salvia, MD;  Location: [facility] INVASIVE CV LAB;  Service: Cardiovascular;  Laterality: N/A;  . LEFT HEART CATHETERIZATION WITH CORONARY/GRAFT ANGIOGRAM N/A 11/17/2012   Procedure: LEFT HEART CATHETERIZATION WITH Isabel Caprice;  Surgeon: [name], MD;  Location: [facility] CATH LAB;  Service: Cardiovascular;  Laterality: N/A;  . LUMBAR FUSION   09/2007   Family History  Problem Relation Age of Onset  . Coronary artery disease Mother   . Heart attack Brother   . Stomach cancer Neg Hx   . Colon cancer Neg Hx   . Esophageal cancer Neg Hx   . Pancreatic cancer Neg Hx   . Prostate cancer Neg Hx   . Rectal cancer Neg Hx    History  Sexual Activity  . Sexual activity: Not on file    Outpatient Encounter Prescriptions as of 04/24/2017  Medication Sig  . acetaminophen (TYLENOL) 325 MG tablet Take 325 mg by mouth 3 (three) times daily as needed for mild pain.  [name] alfuzosin (UROXATRAL) 10 MG 24 hr tablet Take 10 mg by mouth every evening.   [name] amiodarone (PACERONE) 200 MG tablet Take one tablet (200 mg) by mouth once daily (Patient not taking: Reported on 04/22/2017)  . aspirin 81 MG chewable tablet Chew 1 tablet (81 mg total) by mouth daily. (Patient not taking: Reported on 04/02/2017)  . aspirin EC 81 MG tablet Take 81 mg by mouth every evening.  [name] atenolol (TENORMIN) 50 MG tablet TAKE ONE (1) TABLET BY MOUTH EVERY DAY (Patient taking differently: TAKE ONE (1) TABLET BY MOUTH EVERY EVENING)  . atorvastatin (LIPITOR) 20 MG tablet Take 1 tablet (20 mg total) by mouth daily at 6 PM.  . betamethasone dipropionate (DIPROLENE) 0.05 % cream Apply topically 2 (two) times daily. (Patient taking differently: Apply 1 application topically daily as needed (eczema). )  . Cholecalciferol (VITAMIN D3) 400 units CAPS Take 400 Units by mouth daily.  . clopidogrel (PLAVIX) 75 MG tablet TAKE ONE (1) TABLET BY MOUTH EVERY DAY  . clotrimazole-betamethasone (LOTRISONE) cream Apply 1 application topically 2 (two) times daily. (Patient taking differently: Apply 1 application topically daily as needed (eczema). )  . diazepam (VALIUM) 5 MG tablet Take 0.5-1 tablets (2.5-5 mg total) by mouth every 12 (twelve) hours as needed for anxiety.  . dutasteride (AVODART) 0.5 MG capsule Take 0.5 mg by mouth every evening.   . furosemide (LASIX) 20 MG tablet Take 1 tablet (20  mg total) by mouth daily.  . hydrocortisone (ANUSOL-HC) 2.5 % rectal cream bid (Patient not taking: Reported on 04/22/2017)  . hydrocortisone (ANUSOL-HC) 25 MG suppository Use 1 rectal  At bedtime as needed (Patient not taking: Reported on 04/02/2017)  . linaclotide (LINZESS) 290 MCG CAPS capsule Take 1 capsule (290 mcg total) by mouth daily. (Patient taking differently: Take 290 mcg by mouth daily as needed (constipation). )  . losartan (COZAAR) 50 MG tablet Take 1 tablet (50 mg total) by mouth daily.  . Multiple Vitamin (MULTIVITAMIN WITH MINERALS) TABS Take 1 tablet by mouth daily.  . nitroGLYCERIN (NITROSTAT) 0.4 MG SL tablet PLACE ONE TABLET UNDER TONGUE EVERY 5 MINUTES AS NEEDED FOR CHEST PAIN (3 DOSES MAX)  . Polyethylene Glycol 3350 (MIRALAX PO) Take 17 g by mouth daily as needed (constipation).   [name]  vitamin B-12 (CYANOCOBALAMIN) 1000 MCG tablet Take 1,000 mcg by mouth daily.  . vitamin B-12 (CYANOCOBALAMIN) 500 MCG tablet Take 500 mcg by mouth daily.   Facility-Administered Encounter Medications as of 04/24/2017  Medication  . 0.9 %  sodium chloride infusion    Activities of Daily Living In your present state of health, do you have any difficulty performing the following activities: 04/07/2017 04/07/2017  Hearing? - N  Vision? - N  Difficulty concentrating or making decisions? - N  Walking or climbing stairs? - Y  Dressing or bathing? - N  Doing errands, shopping? Y -  Some recent data might be hidden    Patient Care Team: Plotnikov, Georgina Quint, MD as PCP - General (Internal Medicine) Wendall Stade, MD as Attending Physician (Cardiology) Lindell Noe Lehman Prom., MD (Urology) Hilarie Fredrickson, MD as Consulting Physician (Gastroenterology) [name], MD as Consulting Physician (General Surgery) Pyrtle, Carie Caddy, MD as Consulting Physician (Gastroenterology)   Assessment:    Physical assessment deferred to PCP.  Exercise Activities and Dietary recommendations   Diet (meal  preparation, eat out, water intake, caffeinated beverages, dairy products, fruits and vegetables): {Desc; diets:16563} Breakfast: Lunch:  Dinner:      Goals    None     Fall Risk Fall Risk  10/02/2016 08/09/2015  Falls in the past year? Yes Yes  Number falls in past yr: 1 2 or more  Injury with Fall? No Yes   Depression Screen PHQ 2/9 Scores 10/02/2016 08/09/2015  PHQ - 2 Score 0 0    Cognitive Function        Immunization History  Administered Date(s) Administered  . Influenza Whole 09/07/2008, 12/11/2009, 08/26/2011, 09/25/2012  . Influenza, High Dose Seasonal PF 08/05/2016  . Influenza,inj,Quad PF,36+ Mos 08/30/2013, 08/03/2014, 08/09/2015  . Pneumococcal Conjugate-13 01/31/2014  . Pneumococcal Polysaccharide-23 09/08/2007, 06/04/2016  . Td 09/09/2001, 12/26/2011   Screening Tests Health Maintenance  Topic Date Due  . INFLUENZA VACCINE  06/25/2017  . TETANUS/TDAP  12/25/2021  . PNA vac Low Risk Adult  Completed      Plan:     I have personally reviewed and noted the following in the patient's chart:   . Medical and social history . Use of alcohol, tobacco or illicit drugs  . Current medications and supplements . Functional ability and status . Nutritional status . Physical activity . Advanced directives . List of other physicians . Vitals . Screenings to include cognitive, depression, and falls . Referrals and appointments  In addition, I have reviewed and discussed with patient certain preventive protocols, quality metrics, and best practice recommendations. A written personalized care plan for preventive services as well as general preventive health recommendations were provided to patient.     Wanda Plump, RN  04/23/2017

## 2017-04-23 NOTE — Progress Notes (Deleted)
Pre visit review using our clinic review tool, if applicable. No additional management support is needed unless otherwise documented below in the visit note. 

## 2017-04-24 ENCOUNTER — Ambulatory Visit: Payer: Medicare Other

## 2017-04-24 ENCOUNTER — Telehealth: Payer: Self-pay | Admitting: Internal Medicine

## 2017-04-24 ENCOUNTER — Other Ambulatory Visit (INDEPENDENT_AMBULATORY_CARE_PROVIDER_SITE_OTHER): Payer: Medicare Other

## 2017-04-24 ENCOUNTER — Encounter: Payer: Self-pay | Admitting: Internal Medicine

## 2017-04-24 ENCOUNTER — Other Ambulatory Visit: Payer: Self-pay | Admitting: Internal Medicine

## 2017-04-24 ENCOUNTER — Ambulatory Visit (INDEPENDENT_AMBULATORY_CARE_PROVIDER_SITE_OTHER): Payer: Medicare Other | Admitting: Internal Medicine

## 2017-04-24 VITALS — BP 128/60 | HR 63 | Temp 98.0°F | Ht 67.0 in | Wt 137.0 lb

## 2017-04-24 DIAGNOSIS — D649 Anemia, unspecified: Secondary | ICD-10-CM

## 2017-04-24 DIAGNOSIS — I208 Other forms of angina pectoris: Secondary | ICD-10-CM | POA: Diagnosis not present

## 2017-04-24 DIAGNOSIS — Z0001 Encounter for general adult medical examination with abnormal findings: Secondary | ICD-10-CM | POA: Diagnosis not present

## 2017-04-24 DIAGNOSIS — R634 Abnormal weight loss: Secondary | ICD-10-CM | POA: Diagnosis not present

## 2017-04-24 DIAGNOSIS — K819 Cholecystitis, unspecified: Secondary | ICD-10-CM

## 2017-04-24 DIAGNOSIS — I495 Sick sinus syndrome: Secondary | ICD-10-CM | POA: Diagnosis not present

## 2017-04-24 DIAGNOSIS — Z0000 Encounter for general adult medical examination without abnormal findings: Secondary | ICD-10-CM

## 2017-04-24 LAB — CBC WITH DIFFERENTIAL/PLATELET
Basophils Absolute: 0 10*3/uL (ref 0.0–0.1)
Basophils Relative: 0.7 % (ref 0.0–3.0)
Eosinophils Absolute: 0.3 10*3/uL (ref 0.0–0.7)
Eosinophils Relative: 4.5 % (ref 0.0–5.0)
HCT: 36.3 % — ABNORMAL LOW (ref 39.0–52.0)
Hemoglobin: 12.2 g/dL — ABNORMAL LOW (ref 13.0–17.0)
Lymphocytes Relative: 28.9 % (ref 12.0–46.0)
Lymphs Abs: 1.9 10*3/uL (ref 0.7–4.0)
MCHC: 33.6 g/dL (ref 30.0–36.0)
MCV: 86.2 fl (ref 78.0–100.0)
Monocytes Absolute: 0.8 10*3/uL (ref 0.1–1.0)
Monocytes Relative: 12.4 % — ABNORMAL HIGH (ref 3.0–12.0)
Neutro Abs: 3.4 10*3/uL (ref 1.4–7.7)
Neutrophils Relative %: 53.5 % (ref 43.0–77.0)
Platelets: 177 10*3/uL (ref 150.0–400.0)
RBC: 4.21 Mil/uL — ABNORMAL LOW (ref 4.22–5.81)
RDW: 14.7 % (ref 11.5–15.5)
WBC: 6.4 10*3/uL (ref 4.0–10.5)

## 2017-04-24 LAB — BASIC METABOLIC PANEL
BUN: 18 mg/dL (ref 6–23)
CO2: 32 mEq/L (ref 19–32)
Calcium: 10.8 mg/dL — ABNORMAL HIGH (ref 8.4–10.5)
Chloride: 96 mEq/L (ref 96–112)
Creatinine, Ser: 1.18 mg/dL (ref 0.40–1.50)
GFR: 62.73 mL/min (ref >60.00)
Glucose, Bld: 94 mg/dL (ref 70–99)
Potassium: 4.6 mEq/L (ref 3.5–5.1)
Sodium: 131 mEq/L — ABNORMAL LOW (ref 135–145)

## 2017-04-24 LAB — HEPATIC FUNCTION PANEL
ALT: 29 U/L (ref 0–53)
AST: 39 U/L — ABNORMAL HIGH (ref 0–37)
Albumin: 4 g/dL (ref 3.5–5.2)
Alkaline Phosphatase: 215 U/L — ABNORMAL HIGH (ref 39–117)
Bilirubin, Direct: 0.4 mg/dL — ABNORMAL HIGH (ref 0.0–0.3)
Total Bilirubin: 1 mg/dL (ref 0.2–1.2)
Total Protein: 7.5 g/dL (ref 6.0–8.3)

## 2017-04-24 LAB — SEDIMENTATION RATE: Sed Rate: 15 mm/hr (ref 0–20)

## 2017-04-24 MED ORDER — ZOSTER VAC RECOMB ADJUVANTED 50 MCG/0.5ML IM SUSR: 0.5000 mL | Freq: Once | INTRAMUSCULAR | 1 refills | Status: AC

## 2017-04-24 MED ORDER — FUROSEMIDE 20 MG PO TABS: 20.0000 mg | ORAL_TABLET | Freq: Every day | ORAL | 1 refills | Status: DC

## 2017-04-24 NOTE — Progress Notes (Signed)
Subjective:  Patient ID: [name] Christensen, male    DOB: [birth year]  Age: 83 y.o. MRN: [id number]  CC: No chief complaint on file.   HPI [name] Christensen presents for GB dysfunction, fatigue, CAD f/u. Pacemaker was re-adjusted. Cholecystectomy was advised. HIDA scan was abnormal - Dr Michaell Cowing. S/p GI w/up - Dr Marina Goodell  Outpatient Medications Prior to Visit  Medication Sig Dispense Refill  . acetaminophen (TYLENOL) 325 MG tablet Take 325 mg by mouth 3 (three) times daily as needed for mild pain.    [name] alfuzosin (UROXATRAL) 10 MG 24 hr tablet Take 10 mg by mouth every evening.     [name] amiodarone (PACERONE) 200 MG tablet Take one tablet (200 mg) by mouth once daily 30 tablet 6  . atenolol (TENORMIN) 50 MG tablet TAKE ONE (1) TABLET BY MOUTH EVERY DAY (Patient taking differently: TAKE ONE (1) TABLET BY MOUTH EVERY EVENING) 90 tablet 2  . atorvastatin (LIPITOR) 20 MG tablet Take 1 tablet (20 mg total) by mouth daily at 6 PM. 90 tablet 3  . betamethasone dipropionate (DIPROLENE) 0.05 % cream Apply topically 2 (two) times daily. (Patient taking differently: Apply 1 application topically daily as needed (eczema). ) 30 g 3  . Cholecalciferol (VITAMIN D3) 400 units CAPS Take 400 Units by mouth daily.    . clopidogrel (PLAVIX) 75 MG tablet TAKE ONE (1) TABLET BY MOUTH EVERY DAY 30 tablet 5  . clotrimazole-betamethasone (LOTRISONE) cream Apply 1 application topically 2 (two) times daily. (Patient taking differently: Apply 1 application topically daily as needed (eczema). ) 45 g 3  . diazepam (VALIUM) 5 MG tablet Take 0.5-1 tablets (2.5-5 mg total) by mouth every 12 (twelve) hours as needed for anxiety. 30 tablet 2  . dutasteride (AVODART) 0.5 MG capsule Take 0.5 mg by mouth every evening.     . furosemide (LASIX) 20 MG tablet Take 1 tablet (20 mg total) by mouth daily. 30 tablet 1  . hydrocortisone (ANUSOL-HC) 2.5 % rectal cream bid 30 g 1  . linaclotide (LINZESS) 290 MCG CAPS capsule Take 1 capsule (290 mcg  total) by mouth daily. (Patient taking differently: Take 290 mcg by mouth daily as needed (constipation). ) 30 capsule 11  . losartan (COZAAR) 50 MG tablet Take 1 tablet (50 mg total) by mouth daily. 90 tablet 1  . Multiple Vitamin (MULTIVITAMIN WITH MINERALS) TABS Take 1 tablet by mouth daily.    . nitroGLYCERIN (NITROSTAT) 0.4 MG SL tablet PLACE ONE TABLET UNDER TONGUE EVERY 5 MINUTES AS NEEDED FOR CHEST PAIN (3 DOSES MAX) 25 tablet 0  . vitamin B-12 (CYANOCOBALAMIN) 1000 MCG tablet Take 1,000 mcg by mouth daily.    . vitamin B-12 (CYANOCOBALAMIN) 500 MCG tablet Take 500 mcg by mouth daily.    [name] aspirin 81 MG chewable tablet Chew 1 tablet (81 mg total) by mouth daily. (Patient not taking: Reported on 04/02/2017)    . aspirin EC 81 MG tablet Take 81 mg by mouth every evening.    . hydrocortisone (ANUSOL-HC) 25 MG suppository Use 1 rectal  At bedtime as needed (Patient not taking: Reported on 04/02/2017) 30 suppository 6  . Polyethylene Glycol 3350 (MIRALAX PO) Take 17 g by mouth daily as needed (constipation).      Facility-Administered Medications Prior to Visit  Medication Dose Route Frequency Provider Last Rate Last Dose  . 0.9 %  sodium chloride infusion  500 mL Intravenous Continuous Hilarie Fredrickson, MD        ROS  Review of Systems  Constitutional: Positive for fatigue and unexpected weight change. Negative for appetite change.  HENT: Negative for congestion, nosebleeds, sneezing, sore throat and trouble swallowing.   Eyes: Negative for itching and visual disturbance.  Respiratory: Negative for cough.   Cardiovascular: Negative for chest pain, palpitations and leg swelling.  Gastrointestinal: Positive for nausea. Negative for abdominal distention, abdominal pain, blood in stool and diarrhea.  Genitourinary: Negative for frequency and hematuria.  Musculoskeletal: Negative for back pain, gait problem, joint swelling and neck pain.  Skin: Negative for rash.  Neurological: Negative for  dizziness, tremors, speech difficulty and weakness.  Psychiatric/Behavioral: Negative for agitation, dysphoric mood, sleep disturbance and suicidal ideas. The patient is not nervous/anxious.   Thin  Objective:  BP 128/60 (BP Location: Left Arm, Patient Position: Sitting, Cuff Size: Normal)   Pulse 63   Temp 98 F (36.7 C) (Oral)   Ht 5\' 7"  (1.702 m)   Wt 137 lb (62.1 kg)   SpO2 98%   BMI 21.46 kg/m   BP Readings from Last 3 Encounters:  04/24/17 128/60  04/09/17 (!) 161/71  03/25/17 (!) 138/58    Wt Readings from Last 3 Encounters:  04/24/17 137 lb (62.1 kg)  04/09/17 140 lb 14.4 oz (63.9 kg)  03/25/17 156 lb (70.8 kg)    Physical Exam  Constitutional: He is oriented to person, place, and time. He appears well-developed. No distress.  NAD  HENT:  Mouth/Throat: Oropharynx is clear and moist.  Eyes: Conjunctivae are normal. Pupils are equal, round, and reactive to light.  Neck: Normal range of motion. No JVD present. No thyromegaly present.  Cardiovascular: Normal rate, regular rhythm, normal heart sounds and intact distal pulses.  Exam reveals no gallop and no friction rub.   No murmur heard. Pulmonary/Chest: Effort normal and breath sounds normal. No respiratory distress. He has no wheezes. He has no rales. He exhibits no tenderness.  Abdominal: Soft. Bowel sounds are normal. He exhibits no distension and no mass. There is no tenderness. There is no rebound and no guarding.  Musculoskeletal: Normal range of motion. He exhibits no edema or tenderness.  Lymphadenopathy:    He has no cervical adenopathy.  Neurological: He is alert and oriented to person, place, and time. He has normal reflexes. No cranial nerve deficit. He exhibits normal muscle tone. He displays a negative Romberg sign. Coordination and gait normal.  Skin: Skin is warm and dry. No rash noted.  Psychiatric: He has a normal mood and affect. His behavior is normal. Judgment and thought content normal.    Lab  Results  Component Value Date   WBC 5.9 04/02/2017   HGB 12.6 (L) 01/22/2017   HCT 31.2 (L) 04/02/2017   PLT 162 04/02/2017   GLUCOSE 96 04/02/2017   CHOL 118 09/11/2016   TRIG 62.0 09/11/2016   HDL 49.60 09/11/2016   LDLCALC 56 09/11/2016   ALT 24 02/12/2017   AST 31 02/12/2017   NA 127 (L) 04/02/2017   K 4.4 04/02/2017   CL 89 (L) 04/02/2017   CREATININE 0.97 04/02/2017   BUN 16 04/02/2017   CO2 24 04/02/2017   TSH 3.250 01/22/2017   PSA 12.45 (H) 02/06/2016   INR 1.1 04/02/2017   HGBA1C 6.3 (H) 11/22/2012    Nm Hepato W/eject Fract  Result Date: 04/16/2017 CLINICAL DATA:  Right-sided abdominal pain. Thickening of the wall of the gallbladder on ultrasound. EXAM: NUCLEAR MEDICINE HEPATOBILIARY IMAGING WITH GALLBLADDER EF TECHNIQUE: Sequential images of the abdomen were  obtained out to 60 minutes following intravenous administration of radiopharmaceutical. After oral ingestion of Ensure, gallbladder ejection fraction was determined. At 60 min, normal ejection fraction is greater than 33%. RADIOPHARMACEUTICALS:  5.0 mCi Tc-17m  Choletec IV COMPARISON:  02/12/2017 ultrasound FINDINGS: Mildly delayed uptake of radiopharmaceutical from the blood pool, with cardiac blood pool activity still visible past 10 minutes. Biliary activity at 6 minutes. Bowel activity and gallbladder activity at 19 minutes. The patient reported no discomfort after drinking the Ensure meal. Calculated gallbladder ejection fraction is 26%. (Normal gallbladder ejection fraction with Ensure is greater than 33%.) IMPRESSION: 1. Gallbladder dysfunction, with gallbladder ejection fraction at 26% (normal is greater than 33%). 2. There is mildly delayed uptake of radiopharmaceutical from the blood pool, suggesting a low-grade hepatocellular dysfunction. Electronically Signed   By: Gaylyn Rong M.D.   On: 04/16/2017 13:18    Assessment & Plan:   There are no diagnoses linked to this encounter. I have discontinued Mr.  Soldo aspirin, Polyethylene Glycol 3350 (MIRALAX PO), and aspirin EC. I am also having him maintain his alfuzosin, multivitamin with minerals, betamethasone dipropionate, vitamin B-12, clotrimazole-betamethasone, atorvastatin, linaclotide, diazepam, atenolol, dutasteride, amiodarone, nitroGLYCERIN, hydrocortisone, furosemide, losartan, clopidogrel, Vitamin D3, vitamin B-12, and acetaminophen. We will continue to administer sodium chloride.  No orders of the defined types were placed in this encounter.    Follow-up: No Follow-up on file.  [name], MD

## 2017-04-24 NOTE — Progress Notes (Signed)
Pre visit review using our clinic review tool, if applicable. No additional management support is needed unless otherwise documented below in the visit note. 

## 2017-04-24 NOTE — Patient Instructions (Addendum)
Continue to eat heart healthy diet (full of fruits, vegetables, whole grains, lean protein, water--limit salt, fat, and sugar intake) and increase physical activity as tolerated.  Continue doing brain stimulating activities (puzzles, reading, adult coloring books, staying active) to keep memory sharp.    [name] Christensen , Thank you for taking time to come for your Medicare Wellness Visit. I appreciate your ongoing commitment to your health goals. Please review the following plan we discussed and let me know if I can assist you in the future.   These are the goals we discussed: Goals    . Maintain current health status          Continue to exercise, eat healthy and strong, enjoy life, family and stay spiritually connected to God.        This is a list of the screening recommended for you and due dates:  Health Maintenance  Topic Date Due  . Flu Shot  06/25/2017  . Tetanus Vaccine  12/25/2021  . Pneumonia vaccines  Completed

## 2017-04-24 NOTE — Progress Notes (Addendum)
Subjective:   [name] Christensen is a 82 y.o. male who presents for Medicare Annual/Subsequent preventive examination.  Review of Systems:  No ROS.  Medicare Wellness Visit.  Cardiac Risk Factors include: advanced age (>26men, >73 women);dyslipidemia;hypertension;male gender Sleep patterns:  gets up 1-2 times nightly to void and sleeps 4-6 hours nightly. Patient reports long-term insomnia issues, discussed recommended sleep tips.    Home Safety/Smoke Alarms:  Feels safe in home. Smoke alarms in place.   Living environment; residence and Firearm Safety: 2-story house, no firearms. Lives with wife, no needs for DME, good family and church support. Seat Belt Safety/Bike Helmet: Wears seat belt.   Counseling:   Eye Exam- appointment yearly Dental- appointment every 6 months  Male:   CCS- N/A   PSA-  Lab Results  Component Value Date   PSA 12.45 (H) 02/06/2016   PSA 10.39 (H) 08/03/2014   PSA 7.17 (H) 01/12/2013       Objective:    Vitals: BP 128/60 (BP Location: Left Arm, Patient Position: Sitting, Cuff Size: Normal)   Pulse 63   Temp 98 F (36.7 C) (Oral)   Ht 5\' 7"  (1.702 m)   Wt 137 lb (62.1 kg)   SpO2 98%   BMI 21.46 kg/m   Body mass index is 21.46 kg/m.  Tobacco History  Smoking Status  . Never Smoker  Smokeless Tobacco  . Never Used     Counseling given: Not Answered   Past Medical History:  Diagnosis Date  . AICD (automatic cardioverter/defibrillator) present 04/07/2017  . Allergy   . BPH (benign prostatic hypertrophy)    elevated PSA Dr. Lindell Noe Bx 2010  . Cardiac arrest [facility])    a. out-of-hospital arrest 10/25/2012 - EF 40-45%, patent grafts on cath, received St. Jude AICD.  [name] Carotid disease, bilateral (HCC)    a. 0-39% by doppers.  . Cataract    bil cataracts removed  . CHF (congestive heart failure) (HCC)   . Coronary artery disease    a. s/p MI/CABG 2005. b. s/p cath at time of VF arrest 10/2013 - grafts patent.  . Elevated LFTs    a.  10/2012 felt due to cardiac arrest - Hepatitis C Ab reactive from 10/28/2012>>Hep C RNA PCR negative 11/21/2012.   [name] GERD (gastroesophageal reflux disease)   . Hyperlipidemia   . Hypertension   . Myocardial infarction (HCC)    2005 - CABG x 5 2005  . RBBB   . Ventricular bigeminy    a. Event monitor 01/2013: NSR with PVCs and occ bigeminy.   Past Surgical History:  Procedure Laterality Date  . APPENDECTOMY    . CARDIAC CATHETERIZATION  2007   with patent graft anatomy atretic left internal mammary  artery to the LAD which is nonobstructive. Will restart study June 08, 2007  . CATARACT EXTRACTION W/PHACO Right 04/23/2016   Procedure: CATARACT EXTRACTION PHACO AND INTRAOCULAR LENS PLACEMENT (IOC);  Surgeon: [name], MD;  Location: [facility] ORS;  Service: Ophthalmology;  Laterality: Right;  [place] 1.09 AP% 19.3 CDE 13.38 Fluid pack lot # [id number]  . COLONOSCOPY    . CORONARY ARTERY BYPASS GRAFT  2005  . EYE SURGERY    . ICD GENERATOR CHANGEOUT N/A 04/07/2017   Procedure: ICD Generator Changeout;  Surgeon: Duke Salvia, MD;  Location: [facility] INVASIVE CV LAB;  Service: Cardiovascular;  Laterality: N/A;  . IMPLANTABLE CARDIOVERTER DEFIBRILLATOR IMPLANT N/A 10/26/2012   STJ single chamber ICD implanted by Dr Graciela Husbands for cardiac arrest   .  INGUINAL HERNIA REPAIR Bilateral 01/17/2015   Procedure: BILATERAL LAPAROSCOPIC INGUINAL HERNIA REPAIR WITH LEFT FEMORAL HERNIA REPAIR;  Surgeon: [name], MD;  Location: [facility] OR;  Service: General;  Laterality: Bilateral;  . INSERTION OF MESH Bilateral 01/17/2015   Procedure: INSERTION OF MESH;  Surgeon: [name], MD;  Location[facility] OR;  Service: General;  Laterality: Bilateral;  . LEAD INSERTION N/A 04/07/2017   Procedure: RA Lead Insertion;  Surgeon: Duke Salvia, MD;  Location: [facility] INVASIVE CV LAB;  Service: Cardiovascular;  Laterality: N/A;  . LEAD REVISION/REPAIR N/A 04/08/2017   Procedure: Atrial Lead Revision/Repair;  Surgeon: Duke Salvia, MD;   Location: [facility] INVASIVE CV LAB;  Service: Cardiovascular;  Laterality: N/A;  . LEFT HEART CATHETERIZATION WITH CORONARY/GRAFT ANGIOGRAM N/A 10/29/2012   Procedure: LEFT HEART CATHETERIZATION WITH Isabel Caprice;  Surgeon: [name], MD;  Location: [facility] CATH LAB;  Service: Cardiovascular;  Laterality: N/A;  . LUMBAR FUSION  09/2007   Family History  Problem Relation Age of Onset  . Coronary artery disease Mother   . Heart attack Brother   . Stomach cancer Neg Hx   . Colon cancer Neg Hx   . Esophageal cancer Neg Hx   . Pancreatic cancer Neg Hx   . Prostate cancer Neg Hx   . Rectal cancer Neg Hx    History  Sexual Activity  . Sexual activity: Not on file    Outpatient Encounter Prescriptions as of 04/24/2017  Medication Sig  . acetaminophen (TYLENOL) 325 MG tablet Take 325 mg by mouth 3 (three) times daily as needed for mild pain.  [name] alfuzosin (UROXATRAL) 10 MG 24 hr tablet Take 10 mg by mouth every evening.   [name] amiodarone (PACERONE) 200 MG tablet Take one tablet (200 mg) by mouth once daily  . atenolol (TENORMIN) 50 MG tablet TAKE ONE (1) TABLET BY MOUTH EVERY DAY (Patient taking differently: TAKE ONE (1) TABLET BY MOUTH EVERY EVENING)  . atorvastatin (LIPITOR) 20 MG tablet Take 1 tablet (20 mg total) by mouth daily at 6 PM.  . betamethasone dipropionate (DIPROLENE) 0.05 % cream Apply topically 2 (two) times daily. (Patient taking differently: Apply 1 application topically daily as needed (eczema). )  . Cholecalciferol (VITAMIN D3) 400 units CAPS Take 400 Units by mouth daily.  . clopidogrel (PLAVIX) 75 MG tablet TAKE ONE (1) TABLET BY MOUTH EVERY DAY  . clotrimazole-betamethasone (LOTRISONE) cream Apply 1 application topically 2 (two) times daily. (Patient taking differently: Apply 1 application topically daily as needed (eczema). )  . diazepam (VALIUM) 5 MG tablet Take 0.5-1 tablets (2.5-5 mg total) by mouth every 12 (twelve) hours as needed for anxiety.  . dutasteride  (AVODART) 0.5 MG capsule Take 0.5 mg by mouth every evening.   . furosemide (LASIX) 20 MG tablet Take 1 tablet (20 mg total) by mouth daily.  . hydrocortisone (ANUSOL-HC) 2.5 % rectal cream bid  . linaclotide (LINZESS) 290 MCG CAPS capsule Take 1 capsule (290 mcg total) by mouth daily. (Patient taking differently: Take 290 mcg by mouth daily as needed (constipation). )  . losartan (COZAAR) 50 MG tablet Take 1 tablet (50 mg total) by mouth daily.  . Multiple Vitamin (MULTIVITAMIN WITH MINERALS) TABS Take 1 tablet by mouth daily.  . nitroGLYCERIN (NITROSTAT) 0.4 MG SL tablet PLACE ONE TABLET UNDER TONGUE EVERY 5 MINUTES AS NEEDED FOR CHEST PAIN (3 DOSES MAX)  . vitamin B-12 (CYANOCOBALAMIN) 1000 MCG tablet Take 1,000 mcg by mouth daily.  . vitamin B-12 (CYANOCOBALAMIN)  500 MCG tablet Take 500 mcg by mouth daily.  . [DISCONTINUED] furosemide (LASIX) 20 MG tablet Take 1 tablet (20 mg total) by mouth daily.  [name] Zoster Vac Recomb Adjuvanted [facility]) injection Inject 0.5 mLs into the muscle once. Repeat in 6 months  . [DISCONTINUED] aspirin 81 MG chewable tablet Chew 1 tablet (81 mg total) by mouth daily. (Patient not taking: Reported on 04/02/2017)  . [DISCONTINUED] aspirin EC 81 MG tablet Take 81 mg by mouth every evening.  . [DISCONTINUED] hydrocortisone (ANUSOL-HC) 25 MG suppository Use 1 rectal  At bedtime as needed (Patient not taking: Reported on 04/02/2017)  . [DISCONTINUED] Polyethylene Glycol 3350 (MIRALAX PO) Take 17 g by mouth daily as needed (constipation).    Facility-Administered Encounter Medications as of 04/24/2017  Medication  . 0.9 %  sodium chloride infusion    Activities of Daily Living In your present state of health, do you have any difficulty performing the following activities: 04/24/2017 04/07/2017  Hearing? N -  Vision? N -  Difficulty concentrating or making decisions? N -  Walking or climbing stairs? N -  Dressing or bathing? N -  Doing errands, shopping? N Y  Advertising account planner and eating ? N -  Using the Toilet? N -  In the past six months, have you accidently leaked urine? N -  Do you have problems with loss of bowel control? N -  Managing your Medications? N -  Managing your Finances? N -  Housekeeping or managing your Housekeeping? N -  Some recent data might be hidden    Patient Care Team: Plotnikov, Georgina Quint, MD as PCP - General (Internal Medicine) Wendall Stade, MD as Attending Physician (Cardiology) Lindell Noe Lehman Prom., MD (Urology) Hilarie Fredrickson, MD as Consulting Physician (Gastroenterology) [name], MD as Consulting Physician (General Surgery)   Assessment:    Physical assessment deferred to PCP.  Exercise Activities and Dietary recommendations Current Exercise Habits: Structured exercise class, Type of exercise: treadmill;strength training/weights;calisthenics, Time (Minutes): 50, Frequency (Times/Week): 5, Weekly Exercise (Minutes/Week): 250, Intensity: Moderate, Exercise limited by: cardiac condition(s)  Diet (meal preparation, eat out, water intake, caffeinated beverages, dairy products, fruits and vegetables): in general, a "healthy" diet  , well balanced, low fat/ cholesterol, low salt eats a variety of fruits and vegetables daily, limits salt, fat/cholesterol, sugar, caffeine, drinks 6-8 glasses of water daily.   Patient has had recent weight loss. Reviewed to eat small frequent meals, supplement with ensure (coupons provided), and educated regarding staying hydrated.    Goals    . Maintain current health status          Continue to exercise, eat healthy and strong, enjoy life, family and stay spiritually connected to God.       Fall Risk Fall Risk  04/24/2017 10/02/2016 08/09/2015  Falls in the past year? Yes Yes Yes  Number falls in past yr: 1 1 2  or more  Injury with Fall? - No Yes   Depression Screen PHQ 2/9 Scores 04/24/2017 10/02/2016 08/09/2015  PHQ - 2 Score 0 0 0    Cognitive Function       Ad8 score  reviewed for issues:  Issues making decisions: no  Less interest in hobbies / activities: no  Repeats questions, stories (family complaining): no  Trouble using ordinary gadgets (microwave, computer, phone): no  Forgets the month or year: no  Mismanaging finances: no  Remembering appts: no  Daily problems with thinking and/or memory: no Ad8 score is= 0  Immunization History  Administered Date(s) Administered  . Influenza Whole 09/07/2008, 12/11/2009, 08/26/2011, 09/25/2012  . Influenza, High Dose Seasonal PF 08/05/2016  . Influenza,inj,Quad PF,36+ Mos 08/30/2013, 08/03/2014, 08/09/2015  . Pneumococcal Conjugate-13 01/31/2014  . Pneumococcal Polysaccharide-23 09/08/2007, 06/04/2016  . Td 09/09/2001, 12/26/2011   Screening Tests Health Maintenance  Topic Date Due  . INFLUENZA VACCINE  06/25/2017  . TETANUS/TDAP  12/25/2021  . PNA vac Low Risk Adult  Completed      Plan:     Continue to eat heart healthy diet (full of fruits, vegetables, whole grains, lean protein, water--limit salt, fat, and sugar intake) and increase physical activity as tolerated.  Continue doing brain stimulating activities (puzzles, reading, adult coloring books, staying active) to keep memory sharp.   I have personally reviewed and noted the following in the patient's chart:   . Medical and social history . Use of alcohol, tobacco or illicit drugs  . Current medications and supplements . Functional ability and status . Nutritional status . Physical activity . Advanced directives . List of other physicians . Vitals . Screenings to include cognitive, depression, and falls . Referrals and appointments  In addition, I have reviewed and discussed with patient certain preventive protocols, quality metrics, and best practice recommendations. A written personalized care plan for preventive services as well as general preventive health recommendations were provided to patient.     Wanda Plump,  RN  04/24/2017  Medical screening examination/treatment/procedure(s) were performed by non-physician practitioner and as supervising physician I was immediately available for consultation/collaboration. I agree with above. [name], MD

## 2017-04-24 NOTE — Telephone Encounter (Signed)
[name] Christensen ( CCS ) is calling because she received the cardiac clearance , but no instructions on whether the patient is to be off the Plavix before the surgery  Wants to know can he hold it 5 days before

## 2017-04-24 NOTE — Assessment & Plan Note (Addendum)
Cholecystectomy was advised. HIDA scan was abnormal - Dr Johney Maine. S/p GI w/up - Dr Henrene Pastor Appetite is better now  [facility] Readings from Last 3 Encounters:  04/24/17 137 lb (62.1 kg)  04/09/17 140 lb 14.4 oz (63.9 kg)  03/25/17 156 lb (70.8 kg)

## 2017-04-24 NOTE — Assessment & Plan Note (Signed)
Pacemaker  

## 2017-04-24 NOTE — Telephone Encounter (Signed)
Per Dr. [name]- forward to Dr Johnsie Cancel to review plavix in regards to pending cholecystectomy.

## 2017-04-24 NOTE — Telephone Encounter (Signed)
Ok to hold plavix for 5 days 

## 2017-04-24 NOTE — Assessment & Plan Note (Signed)
Cholecystectomy was advised. HIDA scan was abnormal - Dr Johney Maine. S/p GI w/up - Dr Henrene Pastor Appetite is better now

## 2017-04-25 NOTE — Telephone Encounter (Signed)
Refill done.  

## 2017-04-25 NOTE — Telephone Encounter (Signed)
No call was made to patient, this encounter was done in error.

## 2017-04-28 DIAGNOSIS — Z1283 Encounter for screening for malignant neoplasm of skin: Secondary | ICD-10-CM | POA: Diagnosis not present

## 2017-04-28 DIAGNOSIS — L578 Other skin changes due to chronic exposure to nonionizing radiation: Secondary | ICD-10-CM | POA: Diagnosis not present

## 2017-04-28 DIAGNOSIS — D1800 Hemangioma unspecified site: Secondary | ICD-10-CM | POA: Diagnosis not present

## 2017-04-28 DIAGNOSIS — L570 Actinic keratosis: Secondary | ICD-10-CM | POA: Diagnosis not present

## 2017-04-28 DIAGNOSIS — L812 Freckles: Secondary | ICD-10-CM | POA: Diagnosis not present

## 2017-04-28 DIAGNOSIS — L821 Other seborrheic keratosis: Secondary | ICD-10-CM | POA: Diagnosis not present

## 2017-04-28 DIAGNOSIS — D229 Melanocytic nevi, unspecified: Secondary | ICD-10-CM | POA: Diagnosis not present

## 2017-04-28 DIAGNOSIS — L820 Inflamed seborrheic keratosis: Secondary | ICD-10-CM | POA: Diagnosis not present

## 2017-04-28 NOTE — Telephone Encounter (Signed)
Will fax to Dr. Johney Maine at [place]

## 2017-04-29 ENCOUNTER — Encounter: Payer: Self-pay | Admitting: *Deleted

## 2017-04-29 ENCOUNTER — Telehealth: Payer: Self-pay | Admitting: *Deleted

## 2017-04-29 NOTE — Telephone Encounter (Signed)
Dr [name], This patient had recent hospitalization for ICD lead and generator replacement. He was previously cleared for hemorrhoidal banding with an okay to hold plavix prior to and after banding on 3 separate occasions. However, since he was hospitalized AFTER the clearance was given, we just wanted to touch base and again make certain that this patient is:  #1 cleared for hemorrhoidal banding (no sedation needed)  #2 okay to hold Plavix for 3 days before the banding and 7 days after on 3 separate occasions about 2 weeks apart.   Would this be acceptable for [name] Christensen?  Thanks for your help with this matter.

## 2017-04-29 NOTE — Telephone Encounter (Signed)
Dr Hilarie Fredrickson, please see 03/18/17 telephone note. This patient is currently scheduled for hemorrhoidal bandings (1st starting 05/12/17) at which time he is to hold plavix 3 days prior and 7 days after each banding. However, in preparing the chart for upcoming appointment, it appears that since the original clearance, he had an ICD lead and generator replacement (04/07/17). Would this change your plans for banding or would you be okay with proceeding with original instructions to hold plavix? Do I need to get clearance again?

## 2017-04-29 NOTE — Telephone Encounter (Signed)
Given hospitalization with cardiology would clear Plavix again with Dr. [name] Thanks for following this up

## 2017-05-06 NOTE — Telephone Encounter (Signed)
No problem.

## 2017-05-07 ENCOUNTER — Ambulatory Visit: Payer: Medicare Other

## 2017-05-07 NOTE — Telephone Encounter (Signed)
I have spoken with patient to advise that per Dr [name], he is still okay to hold plavix as per previous instructions (even after his ICD change out). Patient verbalizes understanding but relays that he has cancelled banding originally scheduled for 05/12/17 because he now feels better and another doctor has taken care of his symptoms.

## 2017-05-07 NOTE — Telephone Encounter (Signed)
Left voicemail for patient to call back. 

## 2017-05-12 ENCOUNTER — Encounter: Payer: Medicare Other | Admitting: Internal Medicine

## 2017-05-29 DIAGNOSIS — J069 Acute upper respiratory infection, unspecified: Secondary | ICD-10-CM | POA: Diagnosis not present

## 2017-05-29 NOTE — Pre-Procedure Instructions (Signed)
[name] Christensen  05/29/2017      [place], [address] [place] [address] Phone: [phone] Fax: [place], [address] [place] [address] Phone: [phone] Fax: [phone]    Your procedure is scheduled on Thursday 06/05/2017.  Report to [facility] Admitting at 07:00 A.M.  Call this number if you have problems the morning of surgery:  [phone]   Remember:  Do not eat food or drink liquids after midnight.  Take these medicines the morning of surgery with A SIP OF WATER: Tylenol - if needed  7 days prior to surgery STOP taking any Aspirin, Aleve, Naproxen, Ibuprofen, Motrin, Advil, Goody's, BC's, all herbal medications, fish oil, and all vitamins     Do not wear jewelry  Do not wear lotions, powders, or colognes, or deodorant.  Men may shave face and neck.  Do not bring valuables to the hospital.  [facility] is not responsible for any belongings or valuables.  Contacts, dentures or bridgework may not be worn into surgery.  Leave your suitcase in the car.  After surgery it may be brought to your room.  For patients admitted to the hospital, discharge time will be determined by your treatment team.  Patients discharged the day of surgery will not be allowed to drive home.   Name and phone number of your driver:    Special instructions:   [place]- Preparing For Surgery  Before surgery, you can play an important role. Because skin is not sterile, your skin needs to be as free of germs as possible. You can reduce the number of germs on your skin by washing with CHG (chlorahexidine gluconate) Soap before surgery.  CHG is an antiseptic cleaner which kills germs and bonds with the skin to continue killing germs even after washing.  Please do not use if you have an allergy to CHG or antibacterial soaps. If your skin becomes  reddened/irritated stop using the CHG.  Do not shave (including legs and underarms) for at least 48 hours prior to first CHG shower. It is OK to shave your face.  Please follow these instructions carefully.   1. Shower the NIGHT BEFORE SURGERY and the MORNING OF SURGERY with CHG.   2. If you chose to wash your hair, wash your hair first as usual with your normal shampoo.  3. After you shampoo, rinse your hair and body thoroughly to remove the shampoo.  4. Use CHG as you would any other liquid soap. You can apply CHG directly to the skin and wash gently with a scrungie or a clean washcloth.   5. Apply the CHG Soap to your body ONLY FROM THE NECK DOWN.  Do not use on open wounds or open sores. Avoid contact with your eyes, ears, mouth and genitals (private parts). Wash genitals (private parts) with your normal soap.  6. Wash thoroughly, paying special attention to the area where your surgery will be performed.  7. Thoroughly rinse your body with warm water from the neck down.  8. DO NOT shower/wash with your normal soap after using and rinsing off the CHG Soap.  9. Pat yourself dry with a CLEAN TOWEL.   10. Wear CLEAN PAJAMAS   11. Place CLEAN SHEETS on your bed the night of your first shower and DO NOT SLEEP WITH PETS.    Day  of Surgery: Do not apply any deodorants/lotions. Please wear clean clothes to the hospital/surgery center.      Please read over the following fact sheets that you were given. Pain Booklet, Coughing and Deep Breathing and Surgical Site Infection Prevention

## 2017-05-30 ENCOUNTER — Telehealth: Payer: Self-pay | Admitting: Internal Medicine

## 2017-05-30 ENCOUNTER — Encounter (HOSPITAL_COMMUNITY): Payer: Self-pay

## 2017-05-30 ENCOUNTER — Encounter (HOSPITAL_COMMUNITY)
Admission: RE | Admit: 2017-05-30 | Discharge: 2017-05-30 | Disposition: A | Discharge status: Home / Self Care | Payer: Medicare Other | Source: Ambulatory Visit | Attending: Surgery | Admitting: Surgery

## 2017-05-30 DIAGNOSIS — Z01818 Encounter for other preprocedural examination: Secondary | ICD-10-CM | POA: Insufficient documentation

## 2017-05-30 DIAGNOSIS — R945 Abnormal results of liver function studies: Secondary | ICD-10-CM | POA: Insufficient documentation

## 2017-05-30 DIAGNOSIS — K811 Chronic cholecystitis: Secondary | ICD-10-CM | POA: Insufficient documentation

## 2017-05-30 HISTORY — DX: Cough, unspecified: R05.9

## 2017-05-30 HISTORY — DX: Cough: R05

## 2017-05-30 HISTORY — DX: Anxiety disorder, unspecified: F41.9

## 2017-05-30 LAB — CBC
HCT: 31.5 % — ABNORMAL LOW (ref 39.0–52.0)
Hemoglobin: 10.5 g/dL — ABNORMAL LOW (ref 13.0–17.0)
MCH: 29.1 pg (ref 26.0–34.0)
MCHC: 33.3 g/dL (ref 30.0–36.0)
MCV: 87.3 fL (ref 78.0–100.0)
Platelets: 121 10*3/uL — ABNORMAL LOW (ref 150–400)
RBC: 3.61 MIL/uL — ABNORMAL LOW (ref 4.22–5.81)
RDW: 15.8 % — ABNORMAL HIGH (ref 11.5–15.5)
WBC: 3.9 10*3/uL — ABNORMAL LOW (ref 4.0–10.5)

## 2017-05-30 LAB — BASIC METABOLIC PANEL
Anion gap: 5 (ref 5–15)
BUN: 12 mg/dL (ref 6–20)
CO2: 29 mmol/L (ref 22–32)
Calcium: 10.6 mg/dL — ABNORMAL HIGH (ref 8.9–10.3)
Chloride: 96 mmol/L — ABNORMAL LOW (ref 101–111)
Creatinine, Ser: 1.31 mg/dL — ABNORMAL HIGH (ref 0.61–1.24)
GFR calc Af Amer: 57 mL/min — ABNORMAL LOW (ref >60)
GFR calc non Af Amer: 49 mL/min — ABNORMAL LOW (ref >60)
Glucose, Bld: 88 mg/dL (ref 65–99)
Potassium: 4.2 mmol/L (ref 3.5–5.1)
Sodium: 130 mmol/L — ABNORMAL LOW (ref 135–145)

## 2017-05-30 MED ORDER — CHLORHEXIDINE GLUCONATE CLOTH 2 % EX PADS: 6.0000 | MEDICATED_PAD | Freq: Once | CUTANEOUS | Status: DC

## 2017-05-30 NOTE — Progress Notes (Addendum)
Anesthesia Chart Review:  Pt is an 83 year old male scheduled for laparoscopic cholecystectomy with intraoperative cholangiogram, liver biopsy on 06/05/2017 with Michael Boston, M.D.  - PCP is Lew Dawes, MD - Cardiologist is Jenkins Rouge, MD who is aware of upcoming surgery.  - EP cardiologist is Virl Axe, MD  PMH includes: CAD (S/p CABG 2005), VF arrest (10/30/2012), AICD (St. Jude, implanted 2013, generator and RA lead change 04/07/17), CHF, HTN, hyperlipidemia, elevated LFTs, GERD.  Never smoker. BMI 22  Medications include: ASA 81 mg, atenolol, Lipitor, Plavix, Lasix, losartan. Pt to hold plavix 5 days before surgery.   Preoperative labs reviewed.    CXR 04/09/17: Mild bibasilar atelectatic changes. Improved positioning of atrial defibrillator lead.  EKG 04/09/17: NSR, Atrial-sensed ventricular-paced rhythm  Holter monitor 11/27/16:  - NSR - Frequent ventricular ecotpy - 1484/hr 31% of total beats - No significant NSVT or long runs   Echo 01/18/16:  - Left ventricle: The cavity size was normal. Systolic function was normal. The estimated ejection fraction was in the range of 50% to 55%. Diffuse hypokinesis. Regional wall motion abnormalities cannot be excluded. Left ventricular diastolic function parameters were normal. - Mitral valve: There was mild regurgitation. - Left atrium: The atrium was normal in size. - Right ventricle: Systolic function was normal. - Tricuspid valve: There was mild-moderate regurgitation. - Pulmonary arteries: Systolic pressure was within the normal range. - Inferior vena cava: The vessel was dilated. The respirophasic diameter changes were blunted (< 50%), consistent with elevated central venous pressure. - Impressions: Frequent PVCs noted.  Cardiac cath 11/17/2012:  1. Severe three-vessel obstructive coronary disease. 2. All vein grafts are widely patent including saphenous vein graft to the diagonal, saphenous vein graft sequentially to the first  and second obtuse marginal vessels, and saphenous vein graft to the RCA. 3. The atretic LIMA graft to the LAD. However the native LAD has no significant obstructive disease. This is unchanged from prior cardiac catheterization 2007. 4. Normal left ventricular function.  Perioperative prescription form for AICD pending.   If no changes, I anticipate pt can proceed with surgery as scheduled.   Willeen Cass, FNP-BC [facility] Short Stay Surgical Center/Anesthesiology Phone: [phone] 05/30/2017 3:13 PM

## 2017-05-30 NOTE — Progress Notes (Signed)
Dr Aquilla Hacker office notified for icd instructions.Also plavix insrtuctions and cardiac clearance.

## 2017-05-30 NOTE — Telephone Encounter (Signed)
Will forward to patient's primary cardiologist, Dr. Johnsie Cancel to address plavix for surgery.

## 2017-05-30 NOTE — Telephone Encounter (Signed)
New message         [facility] Pre-operative Risk Assessment    Request for surgical clearance:  1. What type of surgery is being performed?  Gall bladder removal  2. When is this surgery scheduled?  06-05-17  3. Are there any medications that need to be held prior to surgery and how long?  Hold plavix 5 days prior?  4. Name of physician performing surgery?  Dr Michael Boston  5. What is your office phone and fax number?  Please put note in epic  [name] Christensen 05/30/2017, 9:45 AM  _________________________________________________________________   (provider comments below)

## 2017-05-31 ENCOUNTER — Other Ambulatory Visit: Payer: Self-pay | Admitting: Internal Medicine

## 2017-06-02 NOTE — Telephone Encounter (Signed)
I knew about it Thanks

## 2017-06-02 NOTE — Telephone Encounter (Signed)
Josue Hector, MD  to Me . Brunetta Genera D     10:49 PM  Note    Ok to hold plavix for 5 days     Dr. Johnsie Cancel has addressed this already on 04/24/17- See Phone note.  Josue Hector, MD  Michael Boston, MD; Aris Georgia, Olin Hauser, RN        Indicated it was ok to hold plavix on 04/24/17

## 2017-06-05 ENCOUNTER — Observation Stay (HOSPITAL_COMMUNITY)
Admission: RE | Admit: 2017-06-05 | Discharge: 2017-06-06 | Disposition: A | Discharge status: Home / Self Care | Payer: Medicare Other | Source: Ambulatory Visit | Attending: Surgery | Admitting: Surgery

## 2017-06-05 ENCOUNTER — Encounter (HOSPITAL_COMMUNITY): Payer: Self-pay | Admitting: *Deleted

## 2017-06-05 ENCOUNTER — Ambulatory Visit (HOSPITAL_COMMUNITY): Payer: Medicare Other | Admitting: Emergency Medicine

## 2017-06-05 ENCOUNTER — Ambulatory Visit (HOSPITAL_COMMUNITY): Payer: Medicare Other

## 2017-06-05 ENCOUNTER — Encounter (HOSPITAL_COMMUNITY)
Admission: RE | Disposition: A | Discharge status: Home / Self Care | Payer: Self-pay | Source: Ambulatory Visit | Attending: Surgery

## 2017-06-05 ENCOUNTER — Ambulatory Visit (HOSPITAL_COMMUNITY): Payer: Medicare Other | Admitting: Anesthesiology

## 2017-06-05 DIAGNOSIS — Z7902 Long term (current) use of antithrombotics/antiplatelets: Secondary | ICD-10-CM | POA: Diagnosis not present

## 2017-06-05 DIAGNOSIS — K648 Other hemorrhoids: Secondary | ICD-10-CM | POA: Diagnosis not present

## 2017-06-05 DIAGNOSIS — K7460 Unspecified cirrhosis of liver: Secondary | ICD-10-CM

## 2017-06-05 DIAGNOSIS — Z8719 Personal history of other diseases of the digestive system: Secondary | ICD-10-CM | POA: Insufficient documentation

## 2017-06-05 DIAGNOSIS — E785 Hyperlipidemia, unspecified: Secondary | ICD-10-CM | POA: Diagnosis not present

## 2017-06-05 DIAGNOSIS — I4510 Unspecified right bundle-branch block: Secondary | ICD-10-CM | POA: Insufficient documentation

## 2017-06-05 DIAGNOSIS — I252 Old myocardial infarction: Secondary | ICD-10-CM | POA: Insufficient documentation

## 2017-06-05 DIAGNOSIS — Z9842 Cataract extraction status, left eye: Secondary | ICD-10-CM | POA: Diagnosis not present

## 2017-06-05 DIAGNOSIS — K8010 Calculus of gallbladder with chronic cholecystitis without obstruction: Secondary | ICD-10-CM | POA: Diagnosis present

## 2017-06-05 DIAGNOSIS — F419 Anxiety disorder, unspecified: Secondary | ICD-10-CM | POA: Insufficient documentation

## 2017-06-05 DIAGNOSIS — Z951 Presence of aortocoronary bypass graft: Secondary | ICD-10-CM | POA: Insufficient documentation

## 2017-06-05 DIAGNOSIS — I509 Heart failure, unspecified: Secondary | ICD-10-CM | POA: Diagnosis not present

## 2017-06-05 DIAGNOSIS — K5900 Constipation, unspecified: Secondary | ICD-10-CM | POA: Diagnosis present

## 2017-06-05 DIAGNOSIS — K739 Chronic hepatitis, unspecified: Secondary | ICD-10-CM | POA: Diagnosis not present

## 2017-06-05 DIAGNOSIS — Z961 Presence of intraocular lens: Secondary | ICD-10-CM | POA: Insufficient documentation

## 2017-06-05 DIAGNOSIS — K219 Gastro-esophageal reflux disease without esophagitis: Secondary | ICD-10-CM | POA: Diagnosis present

## 2017-06-05 DIAGNOSIS — K828 Other specified diseases of gallbladder: Secondary | ICD-10-CM | POA: Diagnosis not present

## 2017-06-05 DIAGNOSIS — I110 Hypertensive heart disease with heart failure: Secondary | ICD-10-CM | POA: Diagnosis not present

## 2017-06-05 DIAGNOSIS — K8020 Calculus of gallbladder without cholecystitis without obstruction: Secondary | ICD-10-CM | POA: Diagnosis not present

## 2017-06-05 DIAGNOSIS — Z981 Arthrodesis status: Secondary | ICD-10-CM | POA: Insufficient documentation

## 2017-06-05 DIAGNOSIS — I2510 Atherosclerotic heart disease of native coronary artery without angina pectoris: Secondary | ICD-10-CM | POA: Insufficient documentation

## 2017-06-05 DIAGNOSIS — Z79899 Other long term (current) drug therapy: Secondary | ICD-10-CM | POA: Insufficient documentation

## 2017-06-05 DIAGNOSIS — Z9889 Other specified postprocedural states: Secondary | ICD-10-CM | POA: Insufficient documentation

## 2017-06-05 DIAGNOSIS — Z95810 Presence of automatic (implantable) cardiac defibrillator: Secondary | ICD-10-CM | POA: Diagnosis not present

## 2017-06-05 DIAGNOSIS — N400 Enlarged prostate without lower urinary tract symptoms: Secondary | ICD-10-CM | POA: Insufficient documentation

## 2017-06-05 DIAGNOSIS — K811 Chronic cholecystitis: Secondary | ICD-10-CM | POA: Diagnosis not present

## 2017-06-05 DIAGNOSIS — Z419 Encounter for procedure for purposes other than remedying health state, unspecified: Secondary | ICD-10-CM

## 2017-06-05 DIAGNOSIS — K732 Chronic active hepatitis, not elsewhere classified: Secondary | ICD-10-CM | POA: Diagnosis not present

## 2017-06-05 DIAGNOSIS — Z880 Allergy status to penicillin: Secondary | ICD-10-CM | POA: Diagnosis not present

## 2017-06-05 DIAGNOSIS — Z7982 Long term (current) use of aspirin: Secondary | ICD-10-CM | POA: Insufficient documentation

## 2017-06-05 DIAGNOSIS — Z8674 Personal history of sudden cardiac arrest: Secondary | ICD-10-CM | POA: Diagnosis not present

## 2017-06-05 DIAGNOSIS — R945 Abnormal results of liver function studies: Secondary | ICD-10-CM | POA: Diagnosis not present

## 2017-06-05 DIAGNOSIS — Z9841 Cataract extraction status, right eye: Secondary | ICD-10-CM | POA: Insufficient documentation

## 2017-06-05 DIAGNOSIS — I10 Essential (primary) hypertension: Secondary | ICD-10-CM | POA: Diagnosis not present

## 2017-06-05 DIAGNOSIS — Z8249 Family history of ischemic heart disease and other diseases of the circulatory system: Secondary | ICD-10-CM | POA: Insufficient documentation

## 2017-06-05 HISTORY — DX: Calculus of gallbladder with chronic cholecystitis without obstruction: K80.10

## 2017-06-05 HISTORY — PX: LIVER BIOPSY: SHX301

## 2017-06-05 HISTORY — PX: CHOLECYSTECTOMY: SHX55

## 2017-06-05 HISTORY — PX: LAPAROSCOPIC CHOLECYSTECTOMY: SUR755

## 2017-06-05 SURGERY — BIOPSY, LIVER
Anesthesia: General | Site: Abdomen

## 2017-06-05 MED ORDER — ACETAMINOPHEN 500 MG PO TABS
1000.0000 mg | ORAL_TABLET | ORAL | Status: AC
  Administered 2017-06-05: 1000 mg via ORAL
  Filled 2017-06-05: qty 2

## 2017-06-05 MED ORDER — LINACLOTIDE 290 MCG PO CAPS
290.0000 ug | ORAL_CAPSULE | Freq: Every day | ORAL | Status: DC | PRN
  Filled 2017-06-05: qty 1

## 2017-06-05 MED ORDER — EPHEDRINE 5 MG/ML INJ
INTRAVENOUS | Status: AC
  Filled 2017-06-05: qty 20

## 2017-06-05 MED ORDER — ONDANSETRON HCL 4 MG/2ML IJ SOLN: 4.0000 mg | Freq: Four times a day (QID) | INTRAMUSCULAR | Status: DC | PRN

## 2017-06-05 MED ORDER — LACTATED RINGERS IV SOLN: 1000.0000 mL | Freq: Three times a day (TID) | INTRAVENOUS | Status: DC | PRN

## 2017-06-05 MED ORDER — LACTATED RINGERS IV SOLN
INTRAVENOUS | Status: DC
  Administered 2017-06-05 – 2017-06-06 (×3): via INTRAVENOUS

## 2017-06-05 MED ORDER — VITAMIN D3 10 MCG (400 UNIT) PO CAPS: 400.0000 [IU] | ORAL_CAPSULE | Freq: Every day | ORAL | Status: DC

## 2017-06-05 MED ORDER — FUROSEMIDE 20 MG PO TABS
20.0000 mg | ORAL_TABLET | Freq: Every day | ORAL | Status: DC
  Administered 2017-06-06: 20 mg via ORAL
  Filled 2017-06-05: qty 1

## 2017-06-05 MED ORDER — HYDRALAZINE HCL 20 MG/ML IJ SOLN: 5.0000 mg | INTRAMUSCULAR | Status: DC | PRN

## 2017-06-05 MED ORDER — CALCIUM 200 MG PO TABS: 200.0000 mg | ORAL_TABLET | Freq: Two times a day (BID) | ORAL | Status: DC

## 2017-06-05 MED ORDER — MENTHOL 3 MG MT LOZG: 1.0000 | LOZENGE | OROMUCOSAL | Status: DC | PRN

## 2017-06-05 MED ORDER — PHENOL 1.4 % MT LIQD
1.0000 | OROMUCOSAL | Status: DC | PRN
Start: 2017-06-05 — End: 2017-06-06

## 2017-06-05 MED ORDER — ONDANSETRON HCL 4 MG/2ML IJ SOLN
INTRAMUSCULAR | Status: AC
  Filled 2017-06-05: qty 2

## 2017-06-05 MED ORDER — TRAMADOL HCL 50 MG PO TABS
50.0000 mg | ORAL_TABLET | Freq: Four times a day (QID) | ORAL | Status: DC | PRN
  Administered 2017-06-06: 100 mg via ORAL
  Filled 2017-06-05: qty 2

## 2017-06-05 MED ORDER — BISACODYL 10 MG RE SUPP: 10.0000 mg | Freq: Every day | RECTAL | Status: DC | PRN

## 2017-06-05 MED ORDER — CALCIUM CARBONATE 1250 (500 CA) MG PO TABS
1.0000 | ORAL_TABLET | Freq: Two times a day (BID) | ORAL | Status: DC
  Administered 2017-06-05 – 2017-06-06 (×2): 500 mg via ORAL
  Filled 2017-06-05 (×2): qty 1

## 2017-06-05 MED ORDER — HYDROCORTISONE 2.5 % RE CREA: 1.0000 "application " | TOPICAL_CREAM | Freq: Four times a day (QID) | RECTAL | Status: DC | PRN

## 2017-06-05 MED ORDER — ONDANSETRON HCL 4 MG/2ML IJ SOLN
INTRAMUSCULAR | Status: DC | PRN
  Administered 2017-06-05: 4 mg via INTRAVENOUS

## 2017-06-05 MED ORDER — ALFUZOSIN HCL ER 10 MG PO TB24
10.0000 mg | ORAL_TABLET | Freq: Every evening | ORAL | Status: DC
  Administered 2017-06-05: 10 mg via ORAL
  Filled 2017-06-05 (×2): qty 1

## 2017-06-05 MED ORDER — METHOCARBAMOL 1000 MG/10ML IJ SOLN
1000.0000 mg | Freq: Four times a day (QID) | INTRAVENOUS | Status: DC | PRN
  Filled 2017-06-05: qty 10

## 2017-06-05 MED ORDER — LIDOCAINE HCL (CARDIAC) 20 MG/ML IV SOLN
INTRAVENOUS | Status: AC
  Filled 2017-06-05: qty 10

## 2017-06-05 MED ORDER — ADULT MULTIVITAMIN W/MINERALS CH
1.0000 | ORAL_TABLET | Freq: Every day | ORAL | Status: DC
  Administered 2017-06-06: 1 via ORAL
  Filled 2017-06-05: qty 1

## 2017-06-05 MED ORDER — GUAIFENESIN-DM 100-10 MG/5ML PO SYRP: 10.0000 mL | ORAL_SOLUTION | ORAL | Status: DC | PRN

## 2017-06-05 MED ORDER — PROPOFOL 10 MG/ML IV BOLUS
INTRAVENOUS | Status: DC | PRN
  Administered 2017-06-05: 150 mg via INTRAVENOUS

## 2017-06-05 MED ORDER — SIMETHICONE 80 MG PO CHEW: 40.0000 mg | CHEWABLE_TABLET | Freq: Four times a day (QID) | ORAL | Status: DC | PRN

## 2017-06-05 MED ORDER — SODIUM CHLORIDE 0.9 % IV SOLN: 250.0000 mL | INTRAVENOUS | Status: DC | PRN

## 2017-06-05 MED ORDER — PROPOFOL 10 MG/ML IV BOLUS
INTRAVENOUS | Status: AC
  Filled 2017-06-05: qty 20

## 2017-06-05 MED ORDER — ENOXAPARIN SODIUM 40 MG/0.4ML ~~LOC~~ SOLN
40.0000 mg | SUBCUTANEOUS | Status: DC
  Administered 2017-06-06: 40 mg via SUBCUTANEOUS
  Filled 2017-06-05: qty 0.4

## 2017-06-05 MED ORDER — IOPAMIDOL (ISOVUE-300) INJECTION 61%
INTRAVENOUS | Status: AC
  Filled 2017-06-05: qty 50

## 2017-06-05 MED ORDER — SODIUM CHLORIDE 0.9% FLUSH: 3.0000 mL | INTRAVENOUS | Status: DC | PRN

## 2017-06-05 MED ORDER — PHENYLEPHRINE HCL 10 MG/ML IJ SOLN
INTRAMUSCULAR | Status: DC | PRN
  Administered 2017-06-05: 80 ug via INTRAVENOUS

## 2017-06-05 MED ORDER — PHENYLEPHRINE 40 MCG/ML (10ML) SYRINGE FOR IV PUSH (FOR BLOOD PRESSURE SUPPORT)
PREFILLED_SYRINGE | INTRAVENOUS | Status: AC
  Filled 2017-06-05: qty 30

## 2017-06-05 MED ORDER — BUPIVACAINE-EPINEPHRINE 0.25% -1:200000 IJ SOLN
INTRAMUSCULAR | Status: DC | PRN
  Administered 2017-06-05: 30 mL

## 2017-06-05 MED ORDER — HYDROMORPHONE HCL 1 MG/ML IJ SOLN
INTRAMUSCULAR | Status: AC
  Filled 2017-06-05: qty 1

## 2017-06-05 MED ORDER — FENTANYL CITRATE (PF) 100 MCG/2ML IJ SOLN
INTRAMUSCULAR | Status: DC | PRN
  Administered 2017-06-05: 100 ug via INTRAVENOUS
  Administered 2017-06-05: 50 ug via INTRAVENOUS

## 2017-06-05 MED ORDER — FENTANYL CITRATE (PF) 100 MCG/2ML IJ SOLN
25.0000 ug | INTRAMUSCULAR | Status: DC | PRN
  Administered 2017-06-05 (×2): 50 ug via INTRAVENOUS

## 2017-06-05 MED ORDER — METHOCARBAMOL 500 MG PO TABS: 500.0000 mg | ORAL_TABLET | Freq: Four times a day (QID) | ORAL | Status: DC | PRN

## 2017-06-05 MED ORDER — BLISTEX MEDICATED EX OINT
TOPICAL_OINTMENT | Freq: Two times a day (BID) | CUTANEOUS | Status: DC
  Administered 2017-06-05 – 2017-06-06 (×2): via TOPICAL
  Filled 2017-06-05: qty 6.3

## 2017-06-05 MED ORDER — SODIUM CHLORIDE 0.9% FLUSH
3.0000 mL | Freq: Two times a day (BID) | INTRAVENOUS | Status: DC
  Administered 2017-06-05: 3 mL via INTRAVENOUS

## 2017-06-05 MED ORDER — TRIAMCINOLONE ACETONIDE 0.5 % EX CREA: TOPICAL_CREAM | Freq: Two times a day (BID) | CUTANEOUS | Status: DC | PRN

## 2017-06-05 MED ORDER — ACETAMINOPHEN 500 MG PO TABS
1000.0000 mg | ORAL_TABLET | Freq: Three times a day (TID) | ORAL | Status: DC
  Administered 2017-06-05 – 2017-06-06 (×3): 1000 mg via ORAL
  Filled 2017-06-05 (×3): qty 2

## 2017-06-05 MED ORDER — ONDANSETRON 4 MG PO TBDP: 4.0000 mg | ORAL_TABLET | Freq: Four times a day (QID) | ORAL | Status: DC | PRN

## 2017-06-05 MED ORDER — LIP MEDEX EX OINT: 1.0000 "application " | TOPICAL_OINTMENT | Freq: Two times a day (BID) | CUTANEOUS | Status: DC

## 2017-06-05 MED ORDER — HYDROCORTISONE 1 % EX CREA: 1.0000 "application " | TOPICAL_CREAM | Freq: Three times a day (TID) | CUTANEOUS | Status: DC | PRN

## 2017-06-05 MED ORDER — LIDOCAINE HCL (CARDIAC) 20 MG/ML IV SOLN
INTRAVENOUS | Status: DC | PRN
Start: 2017-06-05 — End: 2017-06-05
  Administered 2017-06-05: 80 mg via INTRAVENOUS

## 2017-06-05 MED ORDER — TRAMADOL HCL 50 MG PO TABS: 50.0000 mg | ORAL_TABLET | Freq: Four times a day (QID) | ORAL | 0 refills | Status: DC | PRN

## 2017-06-05 MED ORDER — SODIUM CHLORIDE 0.9 % IR SOLN
Status: DC | PRN
  Administered 2017-06-05: 1000 mL

## 2017-06-05 MED ORDER — POLYETHYLENE GLYCOL 3350 17 G PO PACK
17.0000 g | PACK | Freq: Every day | ORAL | Status: DC | PRN
Start: 2017-06-05 — End: 2017-06-06

## 2017-06-05 MED ORDER — VITAMIN B-12 1000 MCG PO TABS
1000.0000 ug | ORAL_TABLET | Freq: Every day | ORAL | Status: DC
  Administered 2017-06-05 – 2017-06-06 (×2): 1000 ug via ORAL
  Filled 2017-06-05 (×2): qty 1

## 2017-06-05 MED ORDER — MIDAZOLAM HCL 2 MG/2ML IJ SOLN
INTRAMUSCULAR | Status: AC
  Filled 2017-06-05: qty 2

## 2017-06-05 MED ORDER — VITAMIN B-12 500 MCG PO TABS: 500.0000 ug | ORAL_TABLET | Freq: Every evening | ORAL | Status: DC

## 2017-06-05 MED ORDER — SUGAMMADEX SODIUM 200 MG/2ML IV SOLN
INTRAVENOUS | Status: AC
  Filled 2017-06-05: qty 2

## 2017-06-05 MED ORDER — BUPIVACAINE-EPINEPHRINE (PF) 0.25% -1:200000 IJ SOLN
INTRAMUSCULAR | Status: AC
  Filled 2017-06-05: qty 30

## 2017-06-05 MED ORDER — GABAPENTIN 300 MG PO CAPS
300.0000 mg | ORAL_CAPSULE | ORAL | Status: AC
  Administered 2017-06-05: 300 mg via ORAL
  Filled 2017-06-05: qty 1

## 2017-06-05 MED ORDER — ASPIRIN EC 81 MG PO TBEC
81.0000 mg | DELAYED_RELEASE_TABLET | Freq: Every evening | ORAL | Status: DC
  Administered 2017-06-05: 81 mg via ORAL
  Filled 2017-06-05: qty 1

## 2017-06-05 MED ORDER — HYDROMORPHONE HCL 1 MG/ML IJ SOLN
0.5000 mg | INTRAMUSCULAR | Status: DC | PRN
  Administered 2017-06-05 – 2017-06-06 (×3): 1 mg via INTRAVENOUS
  Filled 2017-06-05 (×2): qty 1

## 2017-06-05 MED ORDER — DUTASTERIDE 0.5 MG PO CAPS
0.5000 mg | ORAL_CAPSULE | Freq: Every evening | ORAL | Status: DC
  Administered 2017-06-05: 0.5 mg via ORAL
  Filled 2017-06-05 (×2): qty 1

## 2017-06-05 MED ORDER — CHOLECALCIFEROL 10 MCG (400 UNIT) PO TABS
400.0000 [IU] | ORAL_TABLET | Freq: Every day | ORAL | Status: DC
  Administered 2017-06-06: 400 [IU] via ORAL
  Filled 2017-06-05: qty 1

## 2017-06-05 MED ORDER — ROCURONIUM BROMIDE 100 MG/10ML IV SOLN
INTRAVENOUS | Status: DC | PRN
  Administered 2017-06-05: 50 mg via INTRAVENOUS

## 2017-06-05 MED ORDER — NITROGLYCERIN 0.4 MG SL SUBL: 0.4000 mg | SUBLINGUAL_TABLET | SUBLINGUAL | Status: DC | PRN

## 2017-06-05 MED ORDER — FENTANYL CITRATE (PF) 250 MCG/5ML IJ SOLN
INTRAMUSCULAR | Status: AC
  Filled 2017-06-05: qty 5

## 2017-06-05 MED ORDER — ATENOLOL 50 MG PO TABS
50.0000 mg | ORAL_TABLET | Freq: Every day | ORAL | Status: DC
  Administered 2017-06-05 – 2017-06-06 (×2): 50 mg via ORAL
  Filled 2017-06-05 (×3): qty 1

## 2017-06-05 MED ORDER — DIAZEPAM 5 MG PO TABS: 2.5000 mg | ORAL_TABLET | Freq: Two times a day (BID) | ORAL | Status: DC | PRN

## 2017-06-05 MED ORDER — FENTANYL CITRATE (PF) 100 MCG/2ML IJ SOLN
INTRAMUSCULAR | Status: AC
  Administered 2017-06-05: 50 ug via INTRAVENOUS
  Filled 2017-06-05: qty 2

## 2017-06-05 MED ORDER — ALUM & MAG HYDROXIDE-SIMETH 200-200-20 MG/5ML PO SUSP: 30.0000 mL | Freq: Four times a day (QID) | ORAL | Status: DC | PRN

## 2017-06-05 MED ORDER — DIPHENHYDRAMINE HCL 50 MG/ML IJ SOLN: 12.5000 mg | Freq: Four times a day (QID) | INTRAMUSCULAR | Status: DC | PRN

## 2017-06-05 MED ORDER — ATORVASTATIN CALCIUM 20 MG PO TABS
20.0000 mg | ORAL_TABLET | Freq: Every day | ORAL | Status: DC
  Administered 2017-06-05: 20 mg via ORAL
  Filled 2017-06-05: qty 1

## 2017-06-05 MED ORDER — MAGIC MOUTHWASH: 15.0000 mL | Freq: Four times a day (QID) | ORAL | Status: DC | PRN

## 2017-06-05 MED ORDER — DIPHENHYDRAMINE HCL 12.5 MG/5ML PO ELIX: 12.5000 mg | ORAL_SOLUTION | Freq: Four times a day (QID) | ORAL | Status: DC | PRN

## 2017-06-05 MED ORDER — ROCURONIUM BROMIDE 50 MG/5ML IV SOLN
INTRAVENOUS | Status: AC
  Filled 2017-06-05: qty 2

## 2017-06-05 MED ORDER — 0.9 % SODIUM CHLORIDE (POUR BTL) OPTIME
TOPICAL | Status: DC | PRN
  Administered 2017-06-05: 1000 mL

## 2017-06-05 MED ORDER — MEPERIDINE HCL 25 MG/ML IJ SOLN: 6.2500 mg | INTRAMUSCULAR | Status: DC | PRN

## 2017-06-05 MED ORDER — SUGAMMADEX SODIUM 200 MG/2ML IV SOLN
INTRAVENOUS | Status: DC | PRN
  Administered 2017-06-05: 125 mg via INTRAVENOUS

## 2017-06-05 MED ORDER — SODIUM CHLORIDE 0.9 % IV SOLN
INTRAVENOUS | Status: DC | PRN
  Administered 2017-06-05: 8 mL

## 2017-06-05 SURGICAL SUPPLY — 53 items
APPLIER CLIP 5 13 M/L LIGAMAX5 (MISCELLANEOUS) ×2
APPLIER CLIP ROT 10 11.4 M/L (STAPLE) ×2
APR CLP MED LRG 11.4X10 (STAPLE) ×1
BAG SPEC RTRVL LRG 6X4 10 (ENDOMECHANICALS)
BLADE CLIPPER SURG (BLADE) IMPLANT
CANISTER SUCT 3000ML PPV (MISCELLANEOUS) ×2 IMPLANT
CLIP APPLIE 5 13 M/L LIGAMAX5 (MISCELLANEOUS) ×1 IMPLANT
CLIP APPLIE ROT 10 11.4 M/L (STAPLE) ×1 IMPLANT
CONT SPEC 4OZ CLIKSEAL STRL BL (MISCELLANEOUS) ×2 IMPLANT
COVER MAYO STAND STRL (DRAPES) ×2 IMPLANT
COVER SURGICAL LIGHT HANDLE (MISCELLANEOUS) ×2 IMPLANT
DRAPE C-ARM 42X72 X-RAY (DRAPES) ×2 IMPLANT
DRAPE WARM FLUID 44X44 (DRAPE) ×2 IMPLANT
DRSG TEGADERM 2-3/8X2-3/4 SM (GAUZE/BANDAGES/DRESSINGS) ×2 IMPLANT
DRSG TEGADERM 4X4.75 (GAUZE/BANDAGES/DRESSINGS) ×2 IMPLANT
DRSG TELFA 3X8 NADH (GAUZE/BANDAGES/DRESSINGS) ×2 IMPLANT
ELECT REM PT RETURN 9FT ADLT (ELECTROSURGICAL) ×2
ELECTRODE REM PT RTRN 9FT ADLT (ELECTROSURGICAL) ×1 IMPLANT
GAUZE SPONGE 2X2 8PLY STRL LF (GAUZE/BANDAGES/DRESSINGS) ×1 IMPLANT
GLOVE BIO SURGEON STRL SZ7.5 (GLOVE) ×2 IMPLANT
GLOVE BIOGEL PI IND STRL 7.5 (GLOVE) ×1 IMPLANT
GLOVE BIOGEL PI IND STRL 8 (GLOVE) ×1 IMPLANT
GLOVE BIOGEL PI INDICATOR 7.5 (GLOVE) ×1
GLOVE BIOGEL PI INDICATOR 8 (GLOVE) ×1
GLOVE ECLIPSE 8.0 STRL XLNG CF (GLOVE) ×2 IMPLANT
GOWN STRL REUS W/ TWL LRG LVL3 (GOWN DISPOSABLE) ×2 IMPLANT
GOWN STRL REUS W/ TWL XL LVL3 (GOWN DISPOSABLE) ×1 IMPLANT
GOWN STRL REUS W/TWL LRG LVL3 (GOWN DISPOSABLE) ×4
GOWN STRL REUS W/TWL XL LVL3 (GOWN DISPOSABLE) ×2
KIT BASIN OR (CUSTOM PROCEDURE TRAY) ×2 IMPLANT
KIT ROOM TURNOVER OR (KITS) ×2 IMPLANT
NEEDLE 22X1 1/2 (OR ONLY) (NEEDLE) ×2 IMPLANT
NEEDLE BIOPSY 14X6 SOFT TISS (NEEDLE) ×2 IMPLANT
NS IRRIG 1000ML POUR BTL (IV SOLUTION) ×2 IMPLANT
PAD ARMBOARD 7.5X6 YLW CONV (MISCELLANEOUS) ×2 IMPLANT
POUCH SPECIMEN RETRIEVAL 10MM (ENDOMECHANICALS) IMPLANT
SCISSORS LAP 5X35 DISP (ENDOMECHANICALS) ×2 IMPLANT
SET CHOLANGIOGRAPH 5 50 .035 (SET/KITS/TRAYS/PACK) ×2 IMPLANT
SET IRRIG TUBING LAPAROSCOPIC (IRRIGATION / IRRIGATOR) ×2 IMPLANT
SLEEVE ENDOPATH XCEL 5M (ENDOMECHANICALS) ×4 IMPLANT
SPECIMEN JAR SMALL (MISCELLANEOUS) ×2 IMPLANT
SPONGE GAUZE 2X2 STER 10/PKG (GAUZE/BANDAGES/DRESSINGS) ×1
SUT MNCRL AB 4-0 PS2 18 (SUTURE) ×2 IMPLANT
SUT PDS AB 1 CT  36 (SUTURE) ×2
SUT PDS AB 1 CT 36 (SUTURE) ×2 IMPLANT
SUT VICRYL 0 TIES 12 18 (SUTURE) IMPLANT
TOWEL OR 17X24 6PK STRL BLUE (TOWEL DISPOSABLE) ×2 IMPLANT
TOWEL OR 17X26 10 PK STRL BLUE (TOWEL DISPOSABLE) ×2 IMPLANT
TRAY LAPAROSCOPIC MC (CUSTOM PROCEDURE TRAY) ×2 IMPLANT
TROCAR 5M 150ML BLDLS (TROCAR) ×2 IMPLANT
TROCAR XCEL NON-BLD 11X100MML (ENDOMECHANICALS) ×2 IMPLANT
TROCAR XCEL NON-BLD 5MMX100MML (ENDOMECHANICALS) ×2 IMPLANT
TUBING INSUFFLATION (TUBING) ×2 IMPLANT

## 2017-06-05 NOTE — Anesthesia Preprocedure Evaluation (Addendum)
Anesthesia Evaluation  Patient identified by MRN, date of birth, ID band Patient awake    Reviewed: Allergy & Precautions, NPO status , Patient's Chart, lab work & pertinent test results, reviewed documented beta blocker date and time   Airway Mallampati: II  TM Distance: >3 FB Neck ROM: Full    Dental no notable dental hx.    Pulmonary neg pulmonary ROS,    Pulmonary exam normal breath sounds clear to auscultation       Cardiovascular hypertension, Pt. on medications and Pt. on home beta blockers + angina + CAD, + CABG and + Peripheral Vascular Disease  Normal cardiovascular exam+ dysrhythmias + pacemaker + Cardiac Defibrillator  Rhythm:Regular Rate:Normal  AICD interrogation 12/21/2014 Longevity ~7 yrs.  1% VP, 99% VS   Echo 12/2013 - Left ventricle: The cavity size was normal. There was moderate focal basal and mild concentric hypertrophy of the septum. Systolic function was mildly reduced. The estimated ejection fraction was in the range of 45% to 50%. Inferior hypokinesis. Doppler parameters are consistent with abnormal left ventricular relaxation (grade 1 diastolic dysfunction). The E/e' ratio is >10, suggestingelevated LV filling pressure. - Aortic valve: Moderately calcified leaflets. There was no stenosis. No regurgitation. - Right ventricle: The cavity size was mildly dilated. AICD wire noted in right ventricle. Low normal systolic function. - Right atrium: The atrium was mildly dilated (20 cm2). - Tricuspid valve: Mild regurgitation. - Pulmonary arteries: PA peak pressure: 20mm Hg (S). - Inferior vena cava: The vessel was normal in size; the respirophasic diameter changes were in the normal range (=50%); findings are consistent with normal central venouspressure. - Pericardium, extracardiac: There was no pericardial effusion.     Neuro/Psych negative neurological ROS  negative  psych ROS   GI/Hepatic GERD  ,(+) Hepatitis -, C  Endo/Other  negative endocrine ROS  Renal/GU negative Renal ROS     Musculoskeletal negative musculoskeletal ROS (+)   Abdominal   Peds  Hematology negative hematology ROS (+)   Anesthesia Other Findings Echo 01/18/16:  - Left ventricle: The cavity size was normal. Systolic function wasnormal. The estimated ejection fraction was in the range of 50%to 55%. Diffuse hypokinesis. Regional wall motion abnormalitiescannot be excluded. Left ventricular diastolic functionparameters were normal.  Cardiac cath 11/13/2012:  1. Severe three-vessel obstructive coronary disease. 2. All vein grafts are widely patent including saphenous vein graft to the diagonal, saphenous vein graft sequentially to the first and second obtuse marginal vessels, and saphenous vein graft to the RCA. 3. The atretic LIMA graft to the LAD. However the native LAD has no significant obstructive disease. This is unchanged from prior cardiac catheterization 2007. 4. Normal left ventricular function.  Reproductive/Obstetrics negative OB ROS                           Anesthesia Physical  Anesthesia Plan  ASA: III  Anesthesia Plan: General   Post-op Pain Management:    Induction: Intravenous  PONV Risk Score and Plan: 2 and Ondansetron, Dexamethasone and Treatment may vary due to age or medical condition  Airway Management Planned: Oral ETT  Additional Equipment: None  Intra-op Plan:   Post-operative Plan: Extubation in OR  Informed Consent: I have reviewed the patients History and Physical, chart, labs and discussed the procedure including the risks, benefits and alternatives for the proposed anesthesia with the patient or authorized representative who has indicated his/her understanding and acceptance.   Dental advisory given  Plan Discussed with:  CRNA  Anesthesia Plan Comments: (Plan magnet over defibrillator. )        Anesthesia Quick Evaluation                                   Anesthesia Evaluation  Patient identified by MRN, date of birth, ID band Patient awake    Reviewed: Allergy & Precautions, NPO status , Patient's Chart, lab work & pertinent test results, reviewed documented beta blocker date and time   Airway        Dental   Pulmonary neg pulmonary ROS,          Cardiovascular hypertension, Pt. on medications and Pt. on home beta blockers + angina + CAD, + CABG and + Peripheral Vascular Disease + dysrhythmias + pacemaker + Cardiac Defibrillator  AICD interrogation 12/21/2014 Longevity ~7 yrs.  1% VP, 99% VS   Echo 12/2013 - Left ventricle: The cavity size was normal. There was moderate focal basal and mild concentric hypertrophy of the septum. Systolic function was mildly reduced. The estimated ejection fraction was in the range of 45% to 50%. Inferior hypokinesis. Doppler parameters are consistent with abnormal left ventricular relaxation (grade 1 diastolic dysfunction). The E/e' ratio is >10, suggestingelevated LV filling pressure. - Aortic valve: Moderately calcified leaflets. There was no stenosis. No regurgitation. - Right ventricle: The cavity size was mildly dilated. AICD wire noted in right ventricle. Low normal systolic function. - Right atrium: The atrium was mildly dilated (20 cm2). - Tricuspid valve: Mild regurgitation. - Pulmonary arteries: PA peak pressure: 20mm Hg (S). - Inferior vena cava: The vessel was normal in size; the respirophasic diameter changes were in the normal range (=50%); findings are consistent with normal central venouspressure. - Pericardium, extracardiac: There was no pericardial effusion.     Neuro/Psych negative neurological ROS  negative psych ROS   GI/Hepatic GERD-  ,(+) Hepatitis -, C  Endo/Other  negative endocrine ROS  Renal/GU negative Renal ROS      Musculoskeletal negative musculoskeletal ROS (+)   Abdominal   Peds  Hematology negative hematology ROS (+)   Anesthesia Other Findings   Reproductive/Obstetrics negative OB ROS                          Anesthesia Physical Anesthesia Plan  ASA: III  Anesthesia Plan: General   Post-op Pain Management:    Induction: Intravenous  Airway Management Planned: Oral ETT  Additional Equipment: None  Intra-op Plan:   Post-operative Plan: Extubation in OR  Informed Consent: I have reviewed the patients History and Physical, chart, labs and discussed the procedure including the risks, benefits and alternatives for the proposed anesthesia with the patient or authorized representative who has indicated his/her understanding and acceptance.   Dental advisory given  Plan Discussed with: CRNA  Anesthesia Plan Comments: (Plan magnet over defibrillator. )      Anesthesia Quick Evaluation                                   Anesthesia Evaluation  Patient identified by MRN, date of birth, ID band Patient awake    Reviewed: Allergy & Precautions, NPO status , Patient's Chart, lab work & pertinent test results, reviewed documented beta blocker date and time   Airway        Dental   Pulmonary  neg pulmonary ROS,          Cardiovascular hypertension, Pt. on medications and Pt. on home beta blockers + angina + CAD, + CABG and + Peripheral Vascular Disease + dysrhythmias + pacemaker + Cardiac Defibrillator  AICD interrogation 12/21/2014 Longevity ~7 yrs.  1% VP, 99% VS   Echo 12/2013 - Left ventricle: The cavity size was normal. There was moderate focal basal and mild concentric hypertrophy of the septum. Systolic function was mildly reduced. The estimated ejection fraction was in the range of 45% to 50%. Inferior hypokinesis. Doppler parameters are consistent with abnormal left ventricular relaxation (grade 1 diastolic  dysfunction). The E/e' ratio is >10, suggestingelevated LV filling pressure. - Aortic valve: Moderately calcified leaflets. There was no stenosis. No regurgitation. - Right ventricle: The cavity size was mildly dilated. AICD wire noted in right ventricle. Low normal systolic function. - Right atrium: The atrium was mildly dilated (20 cm2). - Tricuspid valve: Mild regurgitation. - Pulmonary arteries: PA peak pressure: 20mm Hg (S). - Inferior vena cava: The vessel was normal in size; the respirophasic diameter changes were in the normal range (=50%); findings are consistent with normal central venouspressure. - Pericardium, extracardiac: There was no pericardial effusion.     Neuro/Psych negative neurological ROS  negative psych ROS   GI/Hepatic GERD-  ,(+) Hepatitis -, C  Endo/Other  negative endocrine ROS  Renal/GU negative Renal ROS     Musculoskeletal negative musculoskeletal ROS (+)   Abdominal   Peds  Hematology negative hematology ROS (+)   Anesthesia Other Findings   Reproductive/Obstetrics negative OB ROS                          Anesthesia Physical Anesthesia Plan  ASA: III  Anesthesia Plan: General   Post-op Pain Management:    Induction: Intravenous  Airway Management Planned: Oral ETT  Additional Equipment: None  Intra-op Plan:   Post-operative Plan: Extubation in OR  Informed Consent: I have reviewed the patients History and Physical, chart, labs and discussed the procedure including the risks, benefits and alternatives for the proposed anesthesia with the patient or authorized representative who has indicated his/her understanding and acceptance.   Dental advisory given  Plan Discussed with: CRNA  Anesthesia Plan Comments: (Plan magnet over defibrillator. )      Anesthesia Quick Evaluation

## 2017-06-05 NOTE — Progress Notes (Signed)
Spoke with Aaron Edelman with [facility] he will be here at 8:45 for device programing

## 2017-06-05 NOTE — Discharge Instructions (Signed)
LAPAROSCOPIC SURGERY: POST OP INSTRUCTIONS ° °###################################################################### ° °EAT °Gradually transition to a high fiber diet with a fiber supplement over the next few weeks after discharge.  Start with a pureed / full liquid diet (see below) ° °WALK °Walk an hour a day.  Control your pain to do that.   ° °CONTROL PAIN °Control pain so that you can walk, sleep, tolerate sneezing/coughing, go up/down stairs. ° °HAVE A BOWEL MOVEMENT DAILY °Keep your bowels regular to avoid problems.  OK to try a laxative to override constipation.  OK to use an antidairrheal to slow down diarrhea.  Call if not better after 2 tries ° °CALL IF YOU HAVE PROBLEMS/CONCERNS °Call if you are still struggling despite following these instructions. °Call if you have concerns not answered by these instructions ° °###################################################################### ° ° ° °1. DIET: Follow a light bland diet the first 24 hours after arrival home, such as soup, liquids, crackers, etc.  Be sure to include lots of fluids daily.  Avoid fast food or heavy meals as your are more likely to get nauseated.  Eat a low fat the next few days after surgery.   °2. Take your usually prescribed home medications unless otherwise directed. °3. PAIN CONTROL: °a. Pain is best controlled by a usual combination of three different methods TOGETHER: °i. Ice/Heat °ii. Over the counter pain medication °iii. Prescription pain medication °b. Most patients will experience some swelling and bruising around the incisions.  Ice packs or heating pads (30-60 minutes up to 6 times a day) will help. Use ice for the first few days to help decrease swelling and bruising, then switch to heat to help relax tight/sore spots and speed recovery.  Some people prefer to use ice alone, heat alone, alternating between ice & heat.  Experiment to what works for you.  Swelling and bruising can take several weeks to resolve.   °c. It is  helpful to take an over-the-counter pain medication regularly for the first few weeks.  Choose one of the following that works best for you: °i. Naproxen (Aleve, etc)  Two 220mg tabs twice a day °ii. Ibuprofen (Advil, etc) Three 200mg tabs four times a day (every meal & bedtime) °iii. Acetaminophen (Tylenol, etc) 500-650mg four times a day (every meal & bedtime) °d. A  prescription for pain medication (such as oxycodone, hydrocodone, etc) should be given to you upon discharge.  Take your pain medication as prescribed.  °i. If you are having problems/concerns with the prescription medicine (does not control pain, nausea, vomiting, rash, itching, etc), please call us [phone] to see if we need to switch you to a different pain medicine that will work better for you and/or control your side effect better. °ii. If you need a refill on your pain medication, please contact your pharmacy.  They will contact our office to request authorization. Prescriptions will not be filled after 5 pm or on week-ends. °4. Avoid getting constipated.  Between the surgery and the pain medications, it is common to experience some constipation.  Increasing fluid intake and taking a fiber supplement (such as Metamucil, Citrucel, FiberCon, MiraLax, etc) 1-2 times a day regularly will usually help prevent this problem from occurring.  A mild laxative (prune juice, Milk of Magnesia, MiraLax, etc) should be taken according to package directions if there are no bowel movements after 48 hours.   °5. Watch out for diarrhea.  If you have many loose bowel movements, simplify your diet to bland foods & liquids for   a few days.  Stop any stool softeners and decrease your fiber supplement.  Switching to mild anti-diarrheal medications (Kayopectate, Pepto Bismol) can help.  If this worsens or does not improve, please call us. °6. Wash / shower every day.  You may shower over the dressings as they are waterproof.  Continue to shower over incision(s)  after the dressing is off. °7. Remove your waterproof bandages 5 days after surgery.  You may leave the incision open to air.  You may replace a dressing/Band-Aid to cover the incision for comfort if you wish.  °8. ACTIVITIES as tolerated:   °a. You may resume regular (light) daily activities beginning the next day--such as daily self-care, walking, climbing stairs--gradually increasing activities as tolerated.  If you can walk 30 minutes without difficulty, it is safe to try more intense activity such as jogging, treadmill, bicycling, low-impact aerobics, swimming, etc. °b. Save the most intensive and strenuous activity for last such as sit-ups, heavy lifting, contact sports, etc  Refrain from any heavy lifting or straining until you are off narcotics for pain control.   °c. DO NOT PUSH THROUGH PAIN.  Let pain be your guide: If it hurts to do something, don't do it.  Pain is your body warning you to avoid that activity for another week until the pain goes down. °d. You may drive when you are no longer taking prescription pain medication, you can comfortably wear a seatbelt, and you can safely maneuver your car and apply brakes. °e. You may have sexual intercourse when it is comfortable.  °9. FOLLOW UP in our office °a. Please call [facility] at [phone] to set up an appointment to see your surgeon in the office for a follow-up appointment approximately 2-3 weeks after your surgery. °b. Make sure that you call for this appointment the day you arrive home to insure a convenient appointment time. °10. IF YOU HAVE DISABILITY OR FAMILY LEAVE FORMS, BRING THEM TO THE OFFICE FOR PROCESSING.  DO NOT GIVE THEM TO YOUR DOCTOR. ° ° °WHEN TO CALL US [phone]: °1. Poor pain control °2. Reactions / problems with new medications (rash/itching, nausea, etc)  °3. Fever over 101.5 F (38.5 C) °4. Inability to urinate °5. Nausea and/or vomiting °6. Worsening swelling or bruising °7. Continued bleeding from incision. °8. Increased  pain, redness, or drainage from the incision ° ° The clinic staff is available to answer your questions during regular business hours (8:30am-5pm).  Please don’t hesitate to call and ask to speak to one of our nurses for clinical concerns.  ° If you have a medical emergency, go to the nearest emergency room or call 911. ° A surgeon from [facility] is always on call at the hospitals ° ° °[facility] °[address], [address], [place], [place]  [address] ? °MAIN: [phone] ? TOLL FREE: [phone] ?  °FAX [phone] °[email or web address] ° ° °

## 2017-06-05 NOTE — Op Note (Signed)
06/05/2017  PATIENT:  [name] Christensen  83 y.o. male  Patient Care Team: Plotnikov, Evie Lacks, MD as PCP - General (Internal Medicine) [name] Hector, MD as Attending Physician (Cardiology) [name] Roche Velora Mediate., MD (Urology) [name] Shipper, MD as Consulting Physician (Gastroenterology) [name] Boston, MD as Consulting Physician (General Surgery)  PRE-OPERATIVE DIAGNOSIS:    Chronic Calculus cholecystitis  POST-OPERATIVE DIAGNOSIS:   Chronic Calculus cholecystitis  Liver: Cirrhosis  PROCEDURE:  Core Liver Biopsy & SINGLE SITE Laparoscopic cholecystectomy with intraoperative cholangiogram  SURGEON:  [name] Hector, MD, FACS.  ASSISTANT: RNFA   ANESTHESIA:    General with endotracheal intubation Local anesthetic as a field block  EBL:  (See Anesthesia Intraoperative Record) Total I/O In: 1000 [I.V.:1000] Out: -   Delay start of Pharmacological VTE agent (>24hrs) due to surgical blood loss or risk of bleeding:  no  DRAINS: None   SPECIMEN: Gallbladder & Core liver biopsies    DISPOSITION OF SPECIMEN:  PATHOLOGY  COUNTS:  YES  PLAN OF CARE: Admit for overnight observation  PATIENT DISPOSITION:  PACU - hemodynamically stable.  INDICATION: Pleasant elderly gentleman with cardiac and kidney issues.  Was had episodes of biliary colic and pain.  Elevated liver function tests.  Gallbladder wall thickening.  Recognition made for cholecystectomy and liver biopsy.  The anatomy & physiology of hepatobiliary & pancreatic function was discussed.  The pathophysiology of gallbladder dysfunction was discussed.  Natural history risks without surgery was discussed.   I feel the risks of no intervention will lead to serious problems that outweigh the operative risks; therefore, I recommended cholecystectomy to remove the pathology.  I explained laparoscopic techniques with possible need for an open approach.  Probable cholangiogram to evaluate the bilary tract was explained as well.     Risks such as bleeding, infection, abscess, leak, injury to other organs, need for further treatment, heart attack, death, and other risks were discussed.  I noted a good likelihood this will help address the problem.  Possibility that this will not correct all abdominal symptoms was explained.  Goals of post-operative recovery were discussed as well.  We will work to minimize complications.  An educational handout further explaining the pathology and treatment options was given as well.  Questions were answered.  The patient expresses understanding & wishes to proceed with surgery.  OR FINDINGS: Body gallbladder with some adhesions suspicious for chronic cholecystitis.  Narrowed but intact cystic duct.  Cholangiogram with typical anatomy and no obstruction.  No common bile duct stone.  No leak.  Liver: Cirrhosis  DESCRIPTION:   The patient was identified & brought in the operating room. The patient was positioned supine with arms tucked. SCDs were active during the entire case. The patient underwent general anesthesia without any difficulty.  The abdomen was prepped and draped in a sterile fashion. A Surgical Timeout confirmed our plan.  I made a transverse curvilinear incision through the superior umbilical fold.  I placed a 37mm long port through the supraumbilical fascia using a modified Hassan cutdown technique with umbilical stalk fascial countertraction. I began carbon dioxide insufflation.  No change in end tidal CO2 measurement.   Camera inspection revealed no injury. There were no adhesions to the anterior abdominal wall supraumbilically.  I proceeded to continue with single site technique. I placed a #5 port in left upper aspect of the wound. I placed a 5 mm atraumatic grasper in the right inferior aspect of the wound.  I turned attention to the right  upper quadrant.  Liver was lobulated with an enlarged right medial sector of the liver suspicious for cirrhosis.  Rigid though.    The  gallbladder fundus was elevated cephalad. I freed adhesions to the ventral surface of the gallbladder off carefully.  I freed the peritoneal coverings between the gallbladder and the liver on the posteriolateral and anteriomedial walls. I alternated between Harmonic & blunt Maryland dissection to help get a good critical view of the cystic artery and cystic duct. I did further dissection to free a few centimeters of the gallbladder off the liver bed to get a good critical view of the infundibulum and cystic duct. I dissected out the cystic artery; and, after getting a good 360 view,  placed clips on the artery.  I skeletonized the cystic duct.   it was rather narrow I placed a clip on the infundibulum. I did a partial cystic duct-otomy and ensured patency. I placed a 5 [place] cholangiocatheter through a puncture site at the right subcostal ridge of the abdominal wall and directed it into the cystic duct.  We ran a cholangiogram with dilute radio-opaque contrast and continuous fluoroscopy. Contrast flowed from a side branch consistent with cystic duct cannulization. Contrast flowed up the common hepatic duct into the right and left intrahepatic chains out to secondary radicals. Contrast flowed down the common bile duct easily across the normal ampulla into the duodenum.  This was consistent with a normal cholangiogram.  I removed the cholangiocatheter. I placed clips on the cystic duct x4.  I completed cystic duct transection.  I completed cystic duct and lymphatic dissection close on the gallbladder side.   I freed the gallbladder from its remaining attachments to the liver.  cousin the lobulated are chronically elevated liver tests, I did core biopsies through the anterior surface of the right hepatic lobe.  I did four core passes and got 2.5 good cores.  I ensured hemostasis on the gallbladder fossa of the liver and elsewhere. I inspected the rest of the abdomen & detected no injury nor bleeding  elsewhere.  I removed the gallbladder out the supraumbilical fascia.  Came out rather easily without any giant stone.  I closed the 1 cm defect fascia transversely using #1 PDS interrupted stitches. I closed the skin using 4-0 monocryl stitch.  Sterile dressing was applied. The patient was extubated & arrived in the PACU in stable condition..  I had discussed postoperative care with the patient in the holding area. I discussed operative findings, updated the patient's status, discussed probable steps to recovery, and gave postoperative recommendations to the patient's spouse.  Recommendations were made.  Questions were answered.  She expressed understanding & appreciation.  [name] Christensen, M.D., F.A.C.S. Gastrointestinal and Minimally Invasive Surgery [facility] [address] [place], [place] [address] [phone] Main / Paging  06/05/2017 10:38 AM

## 2017-06-05 NOTE — Transfer of Care (Signed)
Immediate Anesthesia Transfer of Care Note  [name]  Procedure(s) Performed: Procedure(s) with comments: SINGLE SITE LIVER BIOPSY ERAS PATHWAY (N/A) - ERAS PATHWAY LAPAROSCOPIC CHOLECYSTECTOMY WITH INTRAOPERATIVE CHOLANGIOGRAM ERAS PATHWAY POSSIBLE NEEDLE CORE BIOPSY OF LIVER (N/A) - ERAS PATHWAY  Patient Location: PACU  Anesthesia Type:General  Level of Consciousness: awake, alert , oriented and patient cooperative  Airway & Oxygen Therapy: Patient Spontanous Breathing and Patient connected to nasal cannula oxygen  Post-op Assessment: Report given to RN and Post -op Vital signs reviewed and stable  Post vital signs: Reviewed and stable  Last Vitals:  Vitals:   06/05/17 0716 06/05/17 1050  BP: 116/63 (P) 138/77  Pulse: (!) 52   Resp: 18 (P) 17  Temp: 36.6 C (P) 36.6 C    Last Pain:  Vitals:   06/05/17 0716  TempSrc: Oral      Patients Stated Pain Goal: 3 (59/53/96 7289)  Complications: No apparent anesthesia complications

## 2017-06-05 NOTE — Anesthesia Postprocedure Evaluation (Signed)
Anesthesia Post Note  [name]  Procedure(s) Performed: Procedure(s) (LRB): SINGLE SITE LIVER BIOPSY ERAS PATHWAY (N/A) LAPAROSCOPIC CHOLECYSTECTOMY WITH INTRAOPERATIVE CHOLANGIOGRAM ERAS PATHWAY POSSIBLE NEEDLE CORE BIOPSY OF LIVER (N/A)     Patient location during evaluation: PACU Anesthesia Type: General Level of consciousness: awake and alert Pain management: pain level controlled Vital Signs Assessment: post-procedure vital signs reviewed and stable Respiratory status: spontaneous breathing, nonlabored ventilation, respiratory function stable and patient connected to nasal cannula oxygen Cardiovascular status: blood pressure returned to baseline and stable Postop Assessment: no signs of nausea or vomiting Anesthetic complications: no    Last Vitals:  Vitals:   06/05/17 0716 06/05/17 1050  BP: 116/63 138/77  Pulse: (!) 52   Resp: 18 17  Temp: 36.6 C 36.6 C    Last Pain:  Vitals:   06/05/17 0716  TempSrc: Oral                 Kathryne Ramella

## 2017-06-05 NOTE — H&P (Signed)
[name] Christensen 03/24/2017 3:53 PM Location: [facility] Patient #: [id number] DOB: [birth year] Married / Language: English / Race: White Male   History of Present Illness  The patient is a 83 year old male who presents for evaluation of gall stones. Note for "Gall stones": ` ` ` Patient sent for surgical consultation at the request of Dr. Alain Marion  Chief Complaint: Thickened gallbladder wall  The patient is a pleasant gentleman with significant cardiac issues with stenting. I had done hernia repair in him in the past. He's had some mildly elevated liver function tests especially alkaline phosphatase. Sent for ultrasound which showed some gallbladder wall thickening suspicious for chronic cholecystitis. Patient sent for surgical consultation. Patient notes he sometimes gets a little discomfort when he eats but no severe nausea or vomiting. Has some antiacid medication. No severe heartburn or reflux. No severe dysphasia.  Patient also notes he's had hemorrhoid problems especially after recent colonoscopy. He is felt things popping out. Sometimes has to push them back in. Was referred to gastrology to consider banding. Was cleared by cardiology to hold Plavix for this. However he's not due to see them it almost 2 months. It is bothersome. He wonders if something can be done sooner. He does struggle with constipation occasionally hard stools. He is otherwise rather physically active. No exertional chest pain.  Seen by cardiology.  Holding Plavix  (Review of systems as stated in this history (HPI) or in the review of systems. Otherwise all other 12 point ROS are negative)   Allergies Malachy Moan, RMA; 03/24/2017 3:57 PM) Niacin (Antihyperlipidemic) *ANTIHYPERLIPIDEMICS*  Penicillamine *ASSORTED CLASSES*  Pravastatin Sodium *ANTIHYPERLIPIDEMICS*  Amoxicillin *PENICILLINS*  Atorvastatin Calcium *ANTIHYPERLIPIDEMICS*  Ezetimibe *ANTIHYPERLIPIDEMICS*   Allergies Reconciled   Medication History Malachy Moan, RMA; 03/24/2017 4:04 PM) Alfuzosin HCl ER (10MG  Tablet ER 24HR, Oral) Active. Diazepam (5MG  Tablet, Oral) Active. Atenolol (50MG  Tablet, Oral) Active. Clopidogrel Bisulfate (75MG  Tablet, Oral) Active. Losartan Potassium (50MG  Tablet, Oral) Active. Ketoconazole (2% Cream, External) Active. Nitrostat (0.4MG  Tab Sublingual, Sublingual as needed) Active. Simvastatin (20MG  Tablet, Oral) Active. Mometasone Furoate (0.1% Cream, External) Active. Amiodarone HCl (200MG  Tablet, Oral) Active. Linzess (290MCG Capsule, Oral) Active. Furosemide (20MG  Tablet, Oral) Active. Multiple Vitamins (Oral) Active. Vitamin D (Cholecalciferol) (400UNIT Capsule, Oral) Active. Vitamin B 12 (500MCG Lozenge, Oral) Active. Dutasteride (0.5MG  Capsule, Oral) Active. Aspirin (Oral) Specific strength unknown - Active. Medications Reconciled  Vitals Malachy Moan RMA; 03/24/2017 4:04 PM) 03/24/2017 4:04 PM Weight: 154.2 lb Height: 67in Body Surface Area: 1.81 m Body Mass Index: 24.15 kg/m  Temp.: 98.59F  Pulse: 56 (Regular)  BP: 146/80 (Sitting, Left Arm, Standard)       Physical Exam Adin Hector MD; 03/25/2017 9:25 AM) General Mental Status-Alert. General Appearance-Not in acute distress, Not Sickly. Orientation-Oriented X3. Hydration-Well hydrated. Voice-Normal.  Integumentary Global Assessment Upon inspection and palpation of skin surfaces of the - Axillae: non-tender, no inflammation or ulceration, no drainage. and Distribution of scalp and body hair is normal. General Characteristics Temperature - normal warmth is noted.  Head and Neck Head-normocephalic, atraumatic with no lesions or palpable masses. Face Global Assessment - atraumatic, no absence of expression. Neck Global Assessment - no abnormal movements, no bruit auscultated on the right, no bruit auscultated on the left, no  decreased range of motion, non-tender. Trachea-midline. Thyroid Gland Characteristics - non-tender.  Eye Eyeball - Left-Extraocular movements intact, No Nystagmus. Eyeball - Right-Extraocular movements intact, No Nystagmus. Cornea - Left-No Hazy. Cornea - Right-No Hazy. Sclera/Conjunctiva - Left-No scleral icterus,  No Discharge. Sclera/Conjunctiva - Right-No scleral icterus, No Discharge. Pupil - Left-Direct reaction to light normal. Pupil - Right-Direct reaction to light normal. Note: Wears glasses. Vision corrected   ENMT Ears Pinna - Left - no drainage observed, no generalized tenderness observed. Right - no drainage observed, no generalized tenderness observed. Nose and Sinuses External Inspection of the Nose - no destructive lesion observed. Inspection of the nares - Left - quiet respiration. Right - quiet respiration. Mouth and Throat Lips - Upper Lip - no fissures observed, no pallor noted. Lower Lip - no fissures observed, no pallor noted. Nasopharynx - no discharge present. Oral Cavity/Oropharynx - Tongue - no dryness observed. Oral Mucosa - no cyanosis observed. Hypopharynx - no evidence of airway distress observed.  Chest and Lung Exam Inspection Movements - Normal and Symmetrical. Accessory muscles - No use of accessory muscles in breathing. Palpation Palpation of the chest reveals - Non-tender. Auscultation Breath sounds - Normal and Clear.  Cardiovascular Auscultation Rhythm - Regular. Murmurs & Other Heart Sounds - Auscultation of the heart reveals - No Murmurs and No Systolic Clicks.  Abdomen Inspection Inspection of the abdomen reveals - No Visible peristalsis and No Abnormal pulsations. Umbilicus - No Bleeding, No Urine drainage. Palpation/Percussion Palpation and Percussion of the abdomen reveal - Soft, Non Tender, No Rebound tenderness, No Rigidity (guarding) and No Cutaneous hyperesthesia. Note: Fullness in right upper quadrant with  some discomfort. No Murphy sign no. Epigastric discomfort slightly right of midline. No real left upper quadrant pain Abdomen soft. Nontender, nondistended. No guarding. Mild diastases recti. Mild hernia through umbilical stalk only. 3 mm umbilical hernia with cough. No other hernias   Male Genitourinary Sexual Maturity Tanner 5 - Adult hair pattern and Adult penile size and shape. Note: No inguinal hernias. Normal external genitalia. Epididymi, testes, and spermatic cords normal without any masses.   Rectal Note: Refer to anoscopy note. Right posterior partially resolved thrombosed hemorrhoid. Grade 2/3 right sided internal hemorrhoids   Peripheral Vascular Upper Extremity Inspection - Left - No Cyanotic nailbeds, Not Ischemic. Right - No Cyanotic nailbeds, Not Ischemic.  Neurologic Neurologic evaluation reveals -normal attention span and ability to concentrate, able to name objects and repeat phrases. Appropriate fund of knowledge , normal sensation and normal coordination. Mental Status Affect - not angry, not paranoid. Cranial Nerves-Normal Bilaterally. Gait-Normal.  Neuropsychiatric Mental status exam performed with findings of-able to articulate well with normal speech/language, rate, volume and coherence, thought content normal with ability to perform basic computations and apply abstract reasoning and no evidence of hallucinations, delusions, obsessions or homicidal/suicidal ideation.  Musculoskeletal Global Assessment Spine, Ribs and Pelvis - no instability, subluxation or laxity. Right Upper Extremity - no instability, subluxation or laxity.  Lymphatic Head & Neck  General Head & Neck Lymphatics: Bilateral - Description - No Localized lymphadenopathy. Axillary  General Axillary Region: Bilateral - Description - No Localized lymphadenopathy. Femoral & Inguinal  Generalized Femoral & Inguinal Lymphatics: Left - Description - No Localized  lymphadenopathy. Right - Description - No Localized lymphadenopathy.   Results Adin Hector MD; 03/25/2017 9:27 AM) Procedures  Name Value Date Hemorrhoids Procedure Anal exam: Thrombosed Hemorrhoid Internal exam: Internal Hemorroids ( non-bleeding) *Procedure: banded Other: Enlarged internal hemorrhoids at least grade 2. Borderline grade 3 by history. Patient positioned decubitus. Anoscope confirmed grade 3 hemorrhoids right posterior left lateral piles. Grade 2 right anterior. Hemorrhoidal banding done on right posterior and left lateral piles. Small thrombosed hemorrhoid prolapsed out on right posterior side left in situ  given the fact it is nearly resolved. Patient tolerated procedure well. No pilonidal disease. No fissure. No abscess. No fistula. No condyloma. No other rectal masses.  Performed: 03/24/2017 4:30 PM    Assessment & Plan  THICKENING OF WALL OF GALLBLADDER (K82.8) Impression: Thickening gallbladder wall was mildly increased liver function tests. Suspicious for chronic acalculus cholecystitis but no strong evidence of biliary colic.  Given some abnormalities (elevated bilirubin and alkaline phosphatase. Gallbladder wall thickening on ultrasound) without any other etiology, I would like to have the patient get a nuclear medicine HIDA scan with gallbladder ejection fraction. If his gallbladder fills and empties normally and he is asymptomatic, that argues against cholecystectomy being of left benefit. He does have definite gallbladder dysfunction or reproduction of symptoms, would benefit from cholecystectomy. He thinks it's a reasonable compromise.  He did get cardiac clearance  Current Plans Follow Up - Call [facility] office after tests / studies doneto discuss further plans PROLAPSED INTERNAL HEMORRHOIDS, GRADE 3 (K64.2) Current Plans Pt Education - [facility] Free Text Education/Instructions: discussed with patient and provided information. Follow up if no  improvement or if symptoms worsen  HEMORRHOIDECTOMY, INTERNAL, RUBBER BAND LIGATION (94496) The anatomy & physiology of the anorectal region was discussed. The pathophysiology of hemorrhoids and differential diagnosis was discussed. Natural history progression was discussed. I stressed the importance of a bowel regimen to have daily soft bowel movements to minimize progression of disease.  The patient's symptoms are not adequately controlled. Therefore, I recommended banding to treat the hemorrhoids. I went over the technique, risks, benefits, and alternatives. Goals of post-operative recovery were discussed as well. Questions were answered. The patient expressed understanding & wished to proceed.  The patient was positioned in the lateral decubitus position. Perianal & rectal examination was done. Using anoscopy, I ligated the hemorrhoids above the dentate line with banding. The patient tolerated the procedure well. Educational handouts further explaining the pathology, treatment options, and bowel regimen were given as well.  Pt Education - [facility] Rectal Surgery HCI (Daisi Kentner): discussed with patient and provided information.  THROMBOSED HEMORRHOIDS (K64.5) Impression: Right posterior thrombosed hemorrhoid. Well-formed and mobile consistent with chronic presence and resolving. Not causing major problems right now and should continue to self resolve. We'll hold off on doing any incision and drainage at this time to avoid any bleeding issues since he is fully anticoagulated. Current Plans Pt Education - [facility] Hemorrhoids (Dillie Burandt): discussed with patient and provided information. Pt Education - Pamphlet Given - The Hemorrhoid Book: discussed with patient and provided information.  Adin Hector, M.D., F.A.C.S. Gastrointestinal and Minimally Invasive Surgery [facility] [address], [place], [place] [address] [phone] Main / Paging

## 2017-06-05 NOTE — Anesthesia Procedure Notes (Signed)
Procedure Name: Intubation Date/Time: 06/05/2017 9:24 AM Performed by: Shirlyn Goltz Pre-anesthesia Checklist: Patient identified, Emergency Drugs available, Suction available and Patient being monitored Patient Re-evaluated:Patient Re-evaluated prior to induction Oxygen Delivery Method: Circle system utilized Preoxygenation: Pre-oxygenation with 100% oxygen Induction Type: IV induction Ventilation: Mask ventilation with difficulty Laryngoscope Size: Mac and 4 Grade View: Grade I Tube type: Oral Tube size: 7.0 mm Number of attempts: 1 Airway Equipment and Method: Stylet Placement Confirmation: ETT inserted through vocal cords under direct vision,  positive ETCO2 and breath sounds checked- equal and bilateral Secured at: 21 cm Tube secured with: Tape Dental Injury: Teeth and Oropharynx as per pre-operative assessment

## 2017-06-05 NOTE — Care Management Obs Status (Signed)
[facility] NOTIFICATION   Patient Details  Name: [name] Christensen MRN: [id number] Date of Birth: [birth year]   Medicare Observation Status Notification Given:  Yes    Ninfa Meeker, RN 06/05/2017, 3:09 PM

## 2017-06-06 ENCOUNTER — Encounter (HOSPITAL_COMMUNITY): Payer: Self-pay | Admitting: Surgery

## 2017-06-06 DIAGNOSIS — Z95810 Presence of automatic (implantable) cardiac defibrillator: Secondary | ICD-10-CM | POA: Diagnosis not present

## 2017-06-06 DIAGNOSIS — K811 Chronic cholecystitis: Secondary | ICD-10-CM | POA: Diagnosis not present

## 2017-06-06 DIAGNOSIS — K219 Gastro-esophageal reflux disease without esophagitis: Secondary | ICD-10-CM | POA: Diagnosis not present

## 2017-06-06 DIAGNOSIS — K5900 Constipation, unspecified: Secondary | ICD-10-CM | POA: Diagnosis not present

## 2017-06-06 DIAGNOSIS — E785 Hyperlipidemia, unspecified: Secondary | ICD-10-CM | POA: Diagnosis not present

## 2017-06-06 DIAGNOSIS — K739 Chronic hepatitis, unspecified: Secondary | ICD-10-CM | POA: Diagnosis not present

## 2017-06-06 MED ORDER — TRAMADOL HCL 50 MG PO TABS: 50.0000 mg | ORAL_TABLET | Freq: Four times a day (QID) | ORAL | Status: DC | PRN

## 2017-06-06 MED ORDER — HYDROMORPHONE HCL 1 MG/ML IJ SOLN: 0.5000 mg | INTRAMUSCULAR | Status: DC | PRN

## 2017-06-06 NOTE — Progress Notes (Signed)
Discharge instructions reviewed with pt, wife and daughter.  Copy of instructions and script given to pt.  Pt d/c'd via wheelchair with belongings, with family,             Escorted by hospital volunteer.

## 2017-06-06 NOTE — Progress Notes (Signed)
Spoke with [name] Christensen with [facility] for pt, pt thought [name] Christensen had said yesterday he was going to come by and see him today. [name] Christensen made adjustments to pt's defib/pacer yesterday in the OR.  [name] Christensen states he does not have to make any further adjustments in pt's device. Pt is to follow up with Dr [name] as scheduled in August appointment already made.

## 2017-06-06 NOTE — Progress Notes (Signed)
Responded to consult for prayer to post-op pt awaiting d/c. Visited w/ wife and daughter in rm, then pt returned from walk around unit. Provided spiritual/emotional support, ministry of presence, and prayer -- which they appreciated. Pt and wife attend [employer] in their home town of [place], and their church family is also  praying for pt. He was here in May to receive a pacemaker and returned 2 wks later to find it was not turned on (which then it was). I deferred any Q he may have about its appropriate adjustment now to his nurse. Chaplain available for f/u.    06/06/17 1100  Clinical Encounter Type  Visited With Patient and family together  Visit Type Initial;Psychological support;Spiritual support;Social support;Post-op  Referral From Nurse  Spiritual Encounters  Spiritual Needs Prayer;Emotional  Stress Factors  Patient Stress Factors Health changes;Loss of control  Family Stress Factors Family relationships;Health changes;Loss of control   Gerrit Heck, Chaplain

## 2017-06-06 NOTE — Discharge Summary (Signed)
Physician Discharge Summary  Patient ID: [name] Christensen MRN: [id number] DOB/AGE: [birth year]  83 y.o.  Admit date: 06/05/2017 Discharge date:  06/06/2017   Patient Care Team: Tresa Garter, MD as PCP - General (Internal Medicine) Wendall Stade, MD as Attending Physician (Cardiology) Lindell Noe Lehman Prom., MD (Urology) Hilarie Fredrickson, MD as Consulting Physician (Gastroenterology) [name], MD as Consulting Physician (General Surgery)  Discharge Diagnoses:  Principal Problem:   Chronic cholecystitis s/p lap cholecystectomy 06/05/2017 Active Problems:   Essential hypertension   GERD   Implantable cardioverter-defibrillator (ICD) in situ   Constipation   Dyslipidemia   Hepatic cirrhosis (HCC)   Chronic calculous cholecystitis   POST-OPERATIVE DIAGNOSIS:   Chronic cholecystitis.  Abnormal liver function tests.  SURGERY:  06/05/2017  Procedure(s): SINGLE SITE LIVER BIOPSY ERAS PATHWAY LAPAROSCOPIC CHOLECYSTECTOMY WITH INTRAOPERATIVE CHOLANGIOGRAM ERAS PATHWAY POSSIBLE NEEDLE CORE BIOPSY OF LIVER  SURGEON:    Surgeon(s): [name], MD  Consults: None  Hospital Course:   The patient underwent the surgery above.  Postoperatively, the patient gradually mobilized and advanced to a solid diet.  Pain and other symptoms were treated aggressively.    By the time of discharge, the patient was walking well the hallways, eating food, having flatus.  Pain was well-controlled on an oral medications.  Based on meeting discharge criteria and continuing to recover, I felt it was safe for the patient to be discharged from the hospital to further recover with close followup. Postoperative recommendations were discussed in detail.  They are written as well.  Discharged Condition: good  Disposition:  Follow-up Information    [name], MD. Schedule an appointment as soon as possible for a visit in 3 weeks.   Specialty:  General Surgery Why:  To follow up after your  operation, To follow up after your hospital stay Contact information: [address] [place] [address] [phone]           01-Home or Self Care  Discharge Instructions    Call MD for:    Complete by:  As directed    FEVER >101.5 F (Temperatures <101.61F occasionally happen and are not significant)   Call MD for:    Complete by:  As directed    FEVER >101.5 F (Temperatures <101.61F occasionally happen and are not significant)   Call MD for:  extreme fatigue    Complete by:  As directed    Call MD for:  extreme fatigue    Complete by:  As directed    Call MD for:  persistant dizziness or light-headedness    Complete by:  As directed    Call MD for:  persistant dizziness or light-headedness    Complete by:  As directed    Call MD for:  persistant nausea and vomiting    Complete by:  As directed    Call MD for:  persistant nausea and vomiting    Complete by:  As directed    Call MD for:  redness, tenderness, or signs of infection (pain, swelling, redness, odor or green/yellow discharge around incision site)    Complete by:  As directed    Call MD for:  redness, tenderness, or signs of infection (pain, swelling, redness, odor or green/yellow discharge around incision site)    Complete by:  As directed    Call MD for:  severe uncontrolled pain    Complete by:  As directed    Call MD for:  severe uncontrolled pain  Complete by:  As directed    Diet - low sodium heart healthy    Complete by:  As directed    Follow a light diet the first few days at home.  Start with a bland diet such as soups, liquids, starchy foods, low fat foods, etc.   If you feel full, bloated, or constipated, stay on a full liquid or pureed/blenderized diet for a few days until you feel better and no longer constipated. Gradually get back to a regular solid diet.  Avoid fast food or heavy meals the first week as you are more likely to get nauseated.   Diet - low sodium heart healthy     Complete by:  As directed    Follow a light diet the first few days at home.  Start with a bland diet such as soups, liquids, starchy foods, low fat foods, etc.   If you feel full, bloated, or constipated, stay on a full liquid or pureed/blenderized diet for a few days until you feel better and no longer constipated. Gradually get back to a regular solid diet.  Avoid fast food or heavy meals the first week as you are more likely to get nauseated.   Discharge instructions    Complete by:  As directed    One the day of your discharge from the hospital (or the next business weekday), please call [facility] to set up or confirm an appointment to see your surgeon in the office for a follow-up appointment.  Usually it is 2-3 weeks after your surgery.  Other concerns If you are not getting better after two weeks or are noticing you are getting worse, contact our office [phone] for further advice.  We may need to adjust your medications, re-evaluate you in the office, send you to the emergency room, or see what other things we can do to help. The clinic staff is available to answer your questions during regular business hours (8:30am-5pm).  Please don't hesitate to call and ask to speak to one of our nurses for clinical concerns.    A surgeon from [facility] is always on call at the hospitals 24 hours/day If you have a medical emergency, go to the nearest emergency room or call 911.   Discharge instructions    Complete by:  As directed    One the day of your discharge from the hospital (or the next business weekday), please call [facility] to set up or confirm an appointment to see your surgeon in the office for a follow-up appointment.  Usually it is 2-3 weeks after your surgery.  Other concerns If you are not getting better after two weeks or are noticing you are getting worse, contact our office [phone] for further advice.  We may need to adjust  your medications, re-evaluate you in the office, send you to the emergency room, or see what other things we can do to help. The clinic staff is available to answer your questions during regular business hours (8:30am-5pm).  Please don't hesitate to call and ask to speak to one of our nurses for clinical concerns.    A surgeon from [facility] is always on call at the hospitals 24 hours/day If you have a medical emergency, go to the nearest emergency room or call 911.   Driving Restrictions    Complete by:  As directed    You may drive when you are no longer taking prescription pain medication, you  can comfortably wear a seatbelt, and you can safely maneuver your car and apply brakes.   Driving Restrictions    Complete by:  As directed    You may drive when you are no longer taking prescription pain medication, you can comfortably wear a seatbelt, and you can safely maneuver your car and apply brakes.   Increase activity slowly    Complete by:  As directed    Increase activity slowly    Complete by:  As directed    Lifting restrictions    Complete by:  As directed    You may resume regular (light) daily activities beginning the next day-such as daily self-care, walking, climbing stairs-gradually increasing activities as tolerated.   If you can walk 30 minutes without difficulty, it is safe to try more intense activity such as jogging, treadmill, bicycling, low-impact aerobics, swimming, etc. Save the most intensive and strenuous activity for last such as sit-ups, heavy lifting, contact sports, etc   Refrain from any heavy lifting or straining until you are off narcotics for pain control.   DO NOT PUSH THROUGH PAIN.   Let pain be your guide: If it hurts to do something, don't do it.   Pain is your body warning you to avoid that activity for another week until the pain goes down.   Lifting restrictions    Complete by:  As directed    You may resume regular (light) daily activities  beginning the next day-such as daily self-care, walking, climbing stairs-gradually increasing activities as tolerated.   If you can walk 30 minutes without difficulty, it is safe to try more intense activity such as jogging, treadmill, bicycling, low-impact aerobics, swimming, etc. Save the most intensive and strenuous activity for last such as sit-ups, heavy lifting, contact sports, etc   Refrain from any heavy lifting or straining until you are off narcotics for pain control.   DO NOT PUSH THROUGH PAIN.   Let pain be your guide: If it hurts to do something, don't do it.   Pain is your body warning you to avoid that activity for another week until the pain goes down.   May shower / Bathe    Complete by:  As directed    Wash / shower every day.  You may shower over the dressings as they are waterproof.  Continue to shower over incision(s) after the dressing is off.   May shower / Bathe    Complete by:  As directed    Wash / shower every day.  You may shower over the dressings as they are waterproof.  Continue to shower over incision(s) after the dressing is off.   May walk up steps    Complete by:  As directed    May walk up steps    Complete by:  As directed    Remove dressing in 72 hours    Complete by:  As directed    Remove your waterproof dressings, skin tapes, and other bandages 3-5 days after surgery.   You may leave the incision(s) open to air.   You may replace a dressing/Band-Aid to cover the incision for comfort if you wish.   Remove dressing in 72 hours    Complete by:  As directed    Remove your waterproof dressings, skin tapes, and other bandages 3-5 days after surgery.   You may leave the incision(s) open to air.   You may replace a dressing/Band-Aid to cover the incision for comfort if you wish.  Sexual Activity Restrictions    Complete by:  As directed    You may have sexual intercourse when it is comfortable. If it hurts to do something, stop.   Sexual Activity  Restrictions    Complete by:  As directed    You may have sexual intercourse when it is comfortable. If it hurts to do something, stop.      Allergies as of 06/06/2017      Reactions   Ezetimibe Other (See Comments)   Pt was in coma for 6 days   numbness   Penicillins Other (See Comments)   BLISTERS BETWEEN FINGERS Has patient had a PCN reaction causing immediate rash, facial/tongue/throat swelling, SOB or lightheadedness with hypotension: unknown Has patient had a PCN reaction causing severe rash involving mucus membranes or skin necrosis: unknown Has patient had a PCN reaction that required hospitalization no Has patient had a PCN reaction occurring within the last 10 years: no If all of the above answers are "NO", then may proceed with Cephalosporin use.   Pravastatin Sodium Other (See Comments)   Aches and pains in joints   Rosuvastatin Other (See Comments)   MYALGIAS   Niacin Anxiety   "Makes me feel nervous, jittery"      Medication List    STOP taking these medications   amiodarone 200 MG tablet Commonly known as:  PACERONE   clotrimazole-betamethasone cream Commonly known as:  LOTRISONE     TAKE these medications   acetaminophen 325 MG tablet Commonly known as:  TYLENOL Take 325 mg by mouth 3 (three) times daily as needed for mild pain.   alfuzosin 10 MG 24 hr tablet Commonly known as:  UROXATRAL Take 10 mg by mouth every evening.   aspirin EC 81 MG tablet Take 81 mg by mouth every evening.   atenolol 50 MG tablet Commonly known as:  TENORMIN TAKE ONE (1) TABLET BY MOUTH EVERY DAY What changed:  See the new instructions.   atorvastatin 20 MG tablet Commonly known as:  LIPITOR TAKE ONE (1) TABLET BY MOUTH EVERY DAY AT 6PM   betamethasone dipropionate 0.05 % cream Commonly known as:  DIPROLENE Apply topically 2 (two) times daily. What changed:  how much to take  when to take this  reasons to take this   Calcium 200 MG Tabs Take 200 mg by mouth  2 (two) times daily.   clopidogrel 75 MG tablet Commonly known as:  PLAVIX TAKE ONE (1) TABLET BY MOUTH EVERY DAY   diazepam 5 MG tablet Commonly known as:  VALIUM Take 0.5-1 tablets (2.5-5 mg total) by mouth every 12 (twelve) hours as needed for anxiety.   dutasteride 0.5 MG capsule Commonly known as:  AVODART Take 0.5 mg by mouth every evening.   Flax Seed Oil 1000 MG Caps Take 1,000 mg by mouth every evening.   furosemide 20 MG tablet Commonly known as:  LASIX Take 1 tablet (20 mg total) by mouth daily. What changed:  when to take this  reasons to take this  Another medication with the same name was removed. Continue taking this medication, and follow the directions you see here.   hydrocortisone 2.5 % rectal cream Commonly known as:  ANUSOL-HC bid   linaclotide 290 MCG Caps capsule Commonly known as:  LINZESS Take 1 capsule (290 mcg total) by mouth daily. What changed:  when to take this  reasons to take this   losartan 50 MG tablet Commonly known as:  COZAAR Take 1 tablet (  50 mg total) by mouth daily.   multivitamin with minerals Tabs tablet Take 1 tablet by mouth daily.   nitroGLYCERIN 0.4 MG SL tablet Commonly known as:  NITROSTAT PLACE ONE TABLET UNDER TONGUE EVERY 5 MINUTES AS NEEDED FOR CHEST PAIN (3 DOSES MAX)   traMADol 50 MG tablet Commonly known as:  ULTRAM Take 1-2 tablets (50-100 mg total) by mouth every 6 (six) hours as needed for moderate pain or severe pain.   vitamin B-12 1000 MCG tablet Commonly known as:  CYANOCOBALAMIN Take 1,000 mcg by mouth daily.   vitamin B-12 500 MCG tablet Commonly known as:  CYANOCOBALAMIN Take 500 mcg by mouth every evening.   Vitamin D3 400 units Caps Take 400 Units by mouth daily.       Significant Diagnostic Studies:  No results found for this or any previous visit (from the past 72 hour(s)).  Dg Cholangiogram Operative  Result Date: 06/05/2017 CLINICAL DATA:  LAPAROSCOPIC CHOLECYSTECTOMY  WITH INTRAOPERATIVE CHOLANGIOGRAM. RSTO performed by CNB. Fluoro time: 10 seconds. EXAM: INTRAOPERATIVE CHOLANGIOGRAM TECHNIQUE: Cholangiographic images from the C-arm fluoroscopic device were submitted for interpretation post-operatively. Please see the procedural report for the amount of contrast and the fluoroscopy time utilized. COMPARISON:  Scintigraphy 04/16/2017 FINDINGS: No persistent filling defects in the common duct. Intrahepatic ducts are incompletely visualized, appearing decompressed centrally. Contrast passes into the duodenum. : Negative for retained common duct stone. Electronically Signed   By: Corlis Leak M.D.   On: 06/05/2017 12:02    Discharge Exam: Blood pressure (!) 102/52, pulse 82, temperature 98.6 F (37 C), temperature source Oral, resp. rate 16, height 5\' 7"  (1.702 m), weight 68.7 kg (151 lb 8 oz), SpO2 96 %.  General: Pt awake/alert/oriented x4 in No acute distress Eyes: PERRL, normal EOM.  Sclera clear.  No icterus Neuro: CN II-XII intact w/o focal sensory/motor deficits. Lymph: No head/neck/groin lymphadenopathy Psych:  No delerium/psychosis/paranoia.  Alert, smiling.  Shaking my hand, asking questions.   HENT: Normocephalic, Mucus membranes moist.  No thrush Neck: Supple, No tracheal deviation Chest: No chest wall pain w good excursion CV:  Pulses intact.  Regular rhythm MS: Normal AROM mjr joints.  No obvious deformity Abdomen: Soft.  Nondistended.  Mildly tender at incisions only.  No evidence of peritonitis.  No incarcerated hernias. Ext:  SCDs BLE.  No mjr edema.  No cyanosis Skin: No petechiae / purpura  Past Medical History:  Diagnosis Date  . AICD (automatic cardioverter/defibrillator) present 04/07/2017  . Allergy   . Anxiety   . BPH (benign prostatic hypertrophy)    elevated PSA Dr. Lindell Noe Bx 2010  . Cardiac arrest [facility])    a. out-of-hospital arrest 11/22/2012 - EF 40-45%, patent grafts on cath, received [employer] AICD.  [name] Carotid disease,  bilateral (HCC)    a. 0-39% by doppers.  . Cataract    bil cataracts removed  . CHF (congestive heart failure) (HCC)   . Coronary artery disease    a. s/p MI/CABG 2005. b. s/p cath at time of VF arrest 10/2013 - grafts patent.  . Cough   . Diverticulosis   . Elevated LFTs    a. 10/2012 felt due to cardiac arrest - Hepatitis C Ab reactive from 11/13/2012>>Hep C RNA PCR negative 11/20/2012.   [name] GERD (gastroesophageal reflux disease)   . Hyperlipidemia   . Hypertension   . Internal hemorrhoids   . Myocardial infarction (HCC)    2005 - CABG x 5 2005  . RBBB   .  Tubular adenoma of colon   . Ventricular bigeminy    a. Event monitor 01/2013: NSR with PVCs and occ bigeminy.    Past Surgical History:  Procedure Laterality Date  . APPENDECTOMY    . CARDIAC CATHETERIZATION  2007   with patent graft anatomy atretic left internal mammary  artery to the LAD which is nonobstructive. Will restart study June 08, 2007  . CATARACT EXTRACTION W/PHACO Right 04/23/2016   Procedure: CATARACT EXTRACTION PHACO AND INTRAOCULAR LENS PLACEMENT (IOC);  Surgeon: [name], MD;  Location: [facility] ORS;  Service: Ophthalmology;  Laterality: Right;  [place] 1.09 AP% 19.3 CDE 13.38 Fluid pack lot # [id number]  . CHOLECYSTECTOMY N/A 06/05/2017   Procedure: LAPAROSCOPIC CHOLECYSTECTOMY WITH INTRAOPERATIVE CHOLANGIOGRAM ERAS PATHWAY POSSIBLE NEEDLE CORE BIOPSY OF LIVER;  Surgeon: [name], MD;  Location: [facility] OR;  Service: General;  Laterality: N/A;  ERAS PATHWAY  . COLONOSCOPY    . CORONARY ARTERY BYPASS GRAFT  2005  . EYE SURGERY    . ICD GENERATOR CHANGEOUT N/A 04/07/2017   Procedure: ICD Generator Changeout;  Surgeon: Duke Salvia, MD;  Location: [facility] INVASIVE CV LAB;  Service: Cardiovascular;  Laterality: N/A;  . IMPLANTABLE CARDIOVERTER DEFIBRILLATOR IMPLANT N/A 10/26/2012   STJ single chamber ICD implanted by Dr Graciela Husbands for cardiac arrest   . INGUINAL HERNIA REPAIR Bilateral 01/17/2015   Procedure: BILATERAL  LAPAROSCOPIC INGUINAL HERNIA REPAIR WITH LEFT FEMORAL HERNIA REPAIR;  Surgeon: [name], MD;  Location: [facility] OR;  Service: General;  Laterality: Bilateral;  . INSERTION OF MESH Bilateral 01/17/2015   Procedure: INSERTION OF MESH;  Surgeon: [name], MD;  Location[facility] OR;  Service: General;  Laterality: Bilateral;  . LAPAROSCOPIC CHOLECYSTECTOMY  06/05/2017  . LEAD INSERTION N/A 04/07/2017   Procedure: RA Lead Insertion;  Surgeon: Duke Salvia, MD;  Location: [facility] INVASIVE CV LAB;  Service: Cardiovascular;  Laterality: N/A;  . LEAD REVISION/REPAIR N/A 04/08/2017   Procedure: Atrial Lead Revision/Repair;  Surgeon: Duke Salvia, MD;  Location: [facility] INVASIVE CV LAB;  Service: Cardiovascular;  Laterality: N/A;  . LEFT HEART CATHETERIZATION WITH CORONARY/GRAFT ANGIOGRAM N/A 10/29/2012   Procedure: LEFT HEART CATHETERIZATION WITH Isabel Caprice;  Surgeon: [name], MD;  Location: [facility] CATH LAB;  Service: Cardiovascular;  Laterality: N/A;  . LIVER BIOPSY N/A 06/05/2017   Procedure: SINGLE SITE LIVER BIOPSY ERAS PATHWAY;  Surgeon: [name], MD;  Location: [facility] OR;  Service: General;  Laterality: N/A;  ERAS PATHWAY  . LUMBAR FUSION  09/2007    Social History   Social History  . Marital status: Married    Spouse name: N/A  . Number of children: 5  . Years of education: N/A   Occupational History  . Realtor [employer]   Social History Main Topics  . Smoking status: Never Smoker  . Smokeless tobacco: Never Used  . Alcohol use No  . Drug use: No  . Sexual activity: No   Other Topics Concern  . Not on file   Social History Narrative  . No narrative on file    Family History  Problem Relation Age of Onset  . Coronary artery disease Mother   . Heart attack Brother   . Stomach cancer Neg Hx   . Colon cancer Neg Hx   . Esophageal cancer Neg Hx   . Pancreatic cancer Neg Hx   . Prostate cancer Neg Hx   . Rectal cancer Neg Hx     Current Facility-Administered  Medications  Medication Dose Route  Frequency Provider Last Rate Last Dose  . 0.9 %  sodium chloride infusion  250 mL Intravenous PRN [name], MD      . acetaminophen (TYLENOL) tablet 1,000 mg  1,000 mg Oral Trixie Deis, MD   1,000 mg at 06/06/17 0524  . alfuzosin (UROXATRAL) 24 hr tablet 10 mg  10 mg Oral QPM [name], MD   10 mg at 06/05/17 1749  . alum & mag hydroxide-simeth (MAALOX/MYLANTA) 200-200-20 MG/5ML suspension 30 mL  30 mL Oral Q6H PRN [name], MD      . aspirin EC tablet 81 mg  81 mg Oral QPM [name], MD   81 mg at 06/05/17 1749  . atenolol (TENORMIN) tablet 50 mg  50 mg Oral Daily [name], MD   50 mg at 06/05/17 2126  . atorvastatin (LIPITOR) tablet 20 mg  20 mg Oral q1800 [name], MD   20 mg at 06/05/17 1749  . bisacodyl (DULCOLAX) suppository 10 mg  10 mg Rectal Daily PRN [name], MD      . calcium carbonate (OS-CAL - dosed in mg of elemental calcium) tablet 500 mg of elemental calcium  1 tablet Oral BID [name], MD   500 mg of elemental calcium at 06/05/17 2124  . cholecalciferol (VITAMIN D) tablet 400 Units  400 Units Oral Daily [name], MD      . diazepam (VALIUM) tablet 2.5-5 mg  2.5-5 mg Oral Q12H PRN [name], MD      . diphenhydrAMINE (BENADRYL) 12.5 MG/5ML elixir 12.5 mg  12.5 mg Oral Q6H PRN [name], MD       Or  . diphenhydrAMINE (BENADRYL) injection 12.5 mg  12.5 mg Intravenous Q6H PRN [name], MD      . dutasteride (AVODART) capsule 0.5 mg  0.5 mg Oral QPM [name], MD   0.5 mg at 06/05/17 1749  . enoxaparin (LOVENOX) injection 40 mg  40 mg Subcutaneous Q24H [name], MD      . furosemide (LASIX) tablet 20 mg  20 mg Oral Daily [name], MD      . guaiFENesin-dextromethorphan (ROBITUSSIN DM) 100-10 MG/5ML syrup 10 mL  10 mL Oral Q4H PRN [name], MD      . hydrALAZINE (APRESOLINE) injection 5-20 mg  5-20 mg Intravenous Q4H PRN [name], MD      . hydrocortisone  (ANUSOL-HC) 2.5 % rectal cream 1 application  1 application Topical QID PRN [name], MD      . hydrocortisone cream 1 % 1 application  1 application Topical TID PRN [name], MD      . HYDROmorphone (DILAUDID) injection 0.5-2 mg  0.5-2 mg Intravenous Q2H PRN [name], MD   1 mg at 06/06/17 0119  . lactated ringers infusion 1,000 mL  1,000 mL Intravenous Q8H PRN [name], MD      . lactated ringers infusion   Intravenous Continuous Bethena Midget, MD 10 mL/hr at 06/06/17 0120    . linaclotide (LINZESS) capsule 290 mcg  290 mcg Oral Daily PRN [name], MD      . lip balm (BLISTEX) ointment   Topical BID [name], MD      . magic mouthwash  15 mL Oral QID PRN [name], MD      . menthol-cetylpyridinium (CEPACOL) lozenge 3 mg  1 lozenge Oral PRN [name], MD      . methocarbamol (ROBAXIN) 1,000 mg in dextrose 5 % 50 mL IVPB  1,000 mg Intravenous Q6H PRN [name], MD      . methocarbamol (ROBAXIN) tablet 500 mg  500 mg Oral Q6H PRN [name], MD      . multivitamin with minerals tablet 1 tablet  1 tablet Oral Daily [name], MD      . nitroGLYCERIN (NITROSTAT) SL tablet 0.4 mg  0.4 mg Sublingual Q5 min PRN [name], MD      . ondansetron (ZOFRAN-ODT) disintegrating tablet 4 mg  4 mg Oral Q6H PRN [name], MD       Or  . ondansetron (ZOFRAN) injection 4 mg  4 mg Intravenous Q6H PRN [name], MD      . phenol (CHLORASEPTIC) mouth spray 1-2 spray  1-2 spray Mouth/Throat PRN [name], MD      . polyethylene glycol (MIRALAX / GLYCOLAX) packet 17 g  17 g Oral Daily PRN [name], MD      . simethicone [facility]) chewable tablet 40 mg  40 mg Oral Q6H PRN [name], MD      . sodium chloride flush (NS) 0.9 % injection 3 mL  3 mL Intravenous Catha Gosselin, MD   3 mL at 06/05/17 2126  . sodium chloride flush (NS) 0.9 % injection 3 mL  3 mL Intravenous PRN [name], MD      . traMADol Janean Sark) tablet 50-100 mg  50-100 mg  Oral Q6H PRN [name], MD      . triamcinolone cream (KENALOG) 0.5 %   Topical Q12H PRN [name], MD      . vitamin B-12 (CYANOCOBALAMIN) tablet 1,000 mcg  1,000 mcg Oral Daily [name], MD   1,000 mcg at 06/05/17 1314     Allergies  Allergen Reactions  . Ezetimibe Other (See Comments)    Pt was in coma for 6 days   numbness  . Penicillins Other (See Comments)    BLISTERS BETWEEN FINGERS  Has patient had a PCN reaction causing immediate rash, facial/tongue/throat swelling, SOB or lightheadedness with hypotension: unknown Has patient had a PCN reaction causing severe rash involving mucus membranes or skin necrosis: unknown Has patient had a PCN reaction that required hospitalization no Has patient had a PCN reaction occurring within the last 10 years: no If all of the above answers are "NO", then may proceed with Cephalosporin use.   . Pravastatin Sodium Other (See Comments)    Aches and pains in joints  . Rosuvastatin Other (See Comments)    MYALGIAS  . Niacin Anxiety    "Makes me feel nervous, jittery"    Signed: Lorenso Courier, M.D., F.A.C.S. Gastrointestinal and Minimally Invasive Surgery [facility] [address], [address] [place], [place] [address] [phone] Main / Paging   06/06/2017, 6:54 AM

## 2017-06-20 DIAGNOSIS — R972 Elevated prostate specific antigen [PSA]: Secondary | ICD-10-CM | POA: Diagnosis not present

## 2017-06-20 DIAGNOSIS — N401 Enlarged prostate with lower urinary tract symptoms: Secondary | ICD-10-CM | POA: Diagnosis not present

## 2017-07-08 ENCOUNTER — Ambulatory Visit (INDEPENDENT_AMBULATORY_CARE_PROVIDER_SITE_OTHER): Payer: Medicare Other | Admitting: Internal Medicine

## 2017-07-08 ENCOUNTER — Encounter: Payer: Self-pay | Admitting: Internal Medicine

## 2017-07-08 VITALS — BP 92/50 | HR 81 | Ht 67.0 in | Wt 139.0 lb

## 2017-07-08 DIAGNOSIS — I4510 Unspecified right bundle-branch block: Secondary | ICD-10-CM | POA: Diagnosis not present

## 2017-07-08 DIAGNOSIS — Z95810 Presence of automatic (implantable) cardiac defibrillator: Secondary | ICD-10-CM | POA: Diagnosis not present

## 2017-07-08 DIAGNOSIS — I208 Other forms of angina pectoris: Secondary | ICD-10-CM

## 2017-07-08 DIAGNOSIS — I493 Ventricular premature depolarization: Secondary | ICD-10-CM | POA: Diagnosis not present

## 2017-07-08 DIAGNOSIS — I440 Atrioventricular block, first degree: Secondary | ICD-10-CM

## 2017-07-08 DIAGNOSIS — I255 Ischemic cardiomyopathy: Secondary | ICD-10-CM | POA: Diagnosis not present

## 2017-07-08 DIAGNOSIS — I2589 Other forms of chronic ischemic heart disease: Secondary | ICD-10-CM

## 2017-07-08 MED ORDER — LOSARTAN POTASSIUM 50 MG PO TABS
25.0000 mg | ORAL_TABLET | Freq: Every day | ORAL | 1 refills | Status: DC
Start: 2017-07-08 — End: 2017-12-25

## 2017-07-08 MED ORDER — FUROSEMIDE 20 MG PO TABS: 20.0000 mg | ORAL_TABLET | Freq: Every day | ORAL | 1 refills | Status: DC | PRN

## 2017-07-08 MED ORDER — ATENOLOL 50 MG PO TABS: 25.0000 mg | ORAL_TABLET | Freq: Every day | ORAL | 2 refills | Status: DC

## 2017-07-08 NOTE — Patient Instructions (Addendum)
Medication Instructions: Your physician has recommended you make the following change in your medication:  -1) DECREASE Atenolol - Take 0.5 (25 mg) by mouth daily -2) DECREASE Losartan - Take 0.5 (25 mg) by mouth daily -3) CHANGE Furosemide (Lasix) - Take 1 tablet (20 mg) by mouth daily as needed for swelling and edema  Labwork: Your physician recommends that you have lab work today: BMET and CBC   Procedures/Testing: None Ordered   Follow-Up: Your physician recommends that you schedule a follow-up appointment in [place] with Chanetta Marshall, NP  Your physician wants you to follow-up in 6 MONTHS with Dr. [name]. You will receive a reminder letter in the mail two months in advance. If you don't receive a letter, please call our office to schedule the follow-up appointment.  Remote monitoring is used to monitor your ICD from home. This monitoring reduces the number of office visits required to check your device to one time per year. It allows [place] to keep an eye on the functioning of your device to ensure it is working properly. You are scheduled for a device check from home on 10/07/17. You may send your transmission at any time that day. If you have a wireless device, the transmission will be sent automatically. After your physician reviews your transmission, you will receive a postcard with your next transmission date.  If you need a refill on your cardiac medications before your next appointment, please call your pharmacy.

## 2017-07-08 NOTE — Progress Notes (Signed)
ICD GENERATOR CHANGEOUT N/A 04/07/2017   Procedure: ICD Generator Changeout;  Surgeon: Deboraha Sprang, MD;  Location: [facility];  Service: Cardiovascular;  Laterality: N/A;  . IMPLANTABLE CARDIOVERTER DEFIBRILLATOR IMPLANT N/A 11/20/2012   STJ single chamber ICD implanted by Dr [name] for cardiac arrest   . INGUINAL HERNIA REPAIR Bilateral 01/17/2015   Procedure: BILATERAL LAPAROSCOPIC INGUINAL HERNIA REPAIR WITH LEFT FEMORAL HERNIA REPAIR;  Surgeon: Michael Boston, MD;  Location: [facility];  Service: General;  Laterality: Bilateral;  . INSERTION OF MESH Bilateral 01/17/2015   Procedure: INSERTION OF  MESH;  Surgeon: Michael Boston, MD;  Location: [facility];  Service: General;  Laterality: Bilateral;  . LAPAROSCOPIC CHOLECYSTECTOMY  06/05/2017  . LEAD INSERTION N/A 04/07/2017   Procedure: RA Lead Insertion;  Surgeon: Deboraha Sprang, MD;  Location: [facility];  Service: Cardiovascular;  Laterality: N/A;  . LEAD REVISION/REPAIR N/A 04/08/2017   Procedure: Atrial Lead Revision/Repair;  Surgeon: Deboraha Sprang, MD;  Location: [facility];  Service: Cardiovascular;  Laterality: N/A;  . LEFT HEART CATHETERIZATION WITH CORONARY/GRAFT ANGIOGRAM N/A 11/17/2012   Procedure: LEFT HEART CATHETERIZATION WITH Beatrix Fetters;  Surgeon: Peter M Martinique, MD;  Location: [facility];  Service: Cardiovascular;  Laterality: N/A;  . LIVER BIOPSY N/A 06/05/2017   Procedure: SINGLE SITE LIVER BIOPSY ERAS PATHWAY;  Surgeon: Michael Boston, MD;  Location: [facility];  Service: General;  Laterality: N/A;  ERAS PATHWAY  . LUMBAR FUSION  09/2007    Current Outpatient Prescriptions  Medication Sig Dispense Refill  . acetaminophen (TYLENOL) 325 MG tablet Take 325 mg by mouth 3 (three) times daily as needed for mild pain.    [name] alfuzosin (UROXATRAL) 10 MG 24 hr tablet Take 10 mg by mouth every evening.     [name] aspirin EC 81 MG tablet Take 81 mg by mouth every evening.    [name] atenolol (TENORMIN) 50 MG tablet TAKE ONE (1) TABLET BY MOUTH EVERY DAY (Patient taking differently: TAKE ONE (1) TABLET BY MOUTH EVERY EVENING) 90 tablet 2  . atorvastatin (LIPITOR) 20 MG tablet TAKE ONE (1) TABLET BY MOUTH EVERY DAY AT 6PM 90 tablet 2  . betamethasone dipropionate (DIPROLENE) 0.05 % cream Apply topically 2 (two) times daily. (Patient taking differently: Apply 1 application topically daily as needed (eczema). ) 30 g 3  . Calcium 200 MG TABS Take 200 mg by mouth 2 (two) times daily.    . Cholecalciferol (VITAMIN D3) 400 units CAPS Take 400 Units by mouth daily.    . clopidogrel (PLAVIX) 75 MG tablet TAKE ONE (1) TABLET BY MOUTH  EVERY DAY 30 tablet 5  . diazepam (VALIUM) 5 MG tablet Take 0.5-1 tablets (2.5-5 mg total) by mouth every 12 (twelve) hours as needed for anxiety. 30 tablet 2  . dutasteride (AVODART) 0.5 MG capsule Take 0.5 mg by mouth every evening.     . Flaxseed, Linseed, (FLAX SEED OIL) 1000 MG CAPS Take 1,000 mg by mouth every evening.    . furosemide (LASIX) 20 MG tablet Take 1 tablet (20 mg total) by mouth daily. (Patient taking differently: Take 20 mg by mouth daily as needed for fluid or edema. ) 30 tablet 1  . hydrocortisone (ANUSOL-HC) 2.5 % rectal cream bid 30 g 1  . linaclotide (LINZESS) 290 MCG CAPS capsule Take 1 capsule (290 mcg total) by mouth daily. (Patient taking differently: Take 290 mcg by mouth daily as needed (constipation). ) 30 capsule 11  . losartan (  ICD GENERATOR CHANGEOUT N/A 04/07/2017   Procedure: ICD Generator Changeout;  Surgeon: Deboraha Sprang, MD;  Location: [facility];  Service: Cardiovascular;  Laterality: N/A;  . IMPLANTABLE CARDIOVERTER DEFIBRILLATOR IMPLANT N/A 11/20/2012   STJ single chamber ICD implanted by Dr [name] for cardiac arrest   . INGUINAL HERNIA REPAIR Bilateral 01/17/2015   Procedure: BILATERAL LAPAROSCOPIC INGUINAL HERNIA REPAIR WITH LEFT FEMORAL HERNIA REPAIR;  Surgeon: Michael Boston, MD;  Location: [facility];  Service: General;  Laterality: Bilateral;  . INSERTION OF MESH Bilateral 01/17/2015   Procedure: INSERTION OF  MESH;  Surgeon: Michael Boston, MD;  Location: [facility];  Service: General;  Laterality: Bilateral;  . LAPAROSCOPIC CHOLECYSTECTOMY  06/05/2017  . LEAD INSERTION N/A 04/07/2017   Procedure: RA Lead Insertion;  Surgeon: Deboraha Sprang, MD;  Location: [facility];  Service: Cardiovascular;  Laterality: N/A;  . LEAD REVISION/REPAIR N/A 04/08/2017   Procedure: Atrial Lead Revision/Repair;  Surgeon: Deboraha Sprang, MD;  Location: [facility];  Service: Cardiovascular;  Laterality: N/A;  . LEFT HEART CATHETERIZATION WITH CORONARY/GRAFT ANGIOGRAM N/A 11/17/2012   Procedure: LEFT HEART CATHETERIZATION WITH Beatrix Fetters;  Surgeon: Peter M Martinique, MD;  Location: [facility];  Service: Cardiovascular;  Laterality: N/A;  . LIVER BIOPSY N/A 06/05/2017   Procedure: SINGLE SITE LIVER BIOPSY ERAS PATHWAY;  Surgeon: Michael Boston, MD;  Location: [facility];  Service: General;  Laterality: N/A;  ERAS PATHWAY  . LUMBAR FUSION  09/2007    Current Outpatient Prescriptions  Medication Sig Dispense Refill  . acetaminophen (TYLENOL) 325 MG tablet Take 325 mg by mouth 3 (three) times daily as needed for mild pain.    [name] alfuzosin (UROXATRAL) 10 MG 24 hr tablet Take 10 mg by mouth every evening.     [name] aspirin EC 81 MG tablet Take 81 mg by mouth every evening.    [name] atenolol (TENORMIN) 50 MG tablet TAKE ONE (1) TABLET BY MOUTH EVERY DAY (Patient taking differently: TAKE ONE (1) TABLET BY MOUTH EVERY EVENING) 90 tablet 2  . atorvastatin (LIPITOR) 20 MG tablet TAKE ONE (1) TABLET BY MOUTH EVERY DAY AT 6PM 90 tablet 2  . betamethasone dipropionate (DIPROLENE) 0.05 % cream Apply topically 2 (two) times daily. (Patient taking differently: Apply 1 application topically daily as needed (eczema). ) 30 g 3  . Calcium 200 MG TABS Take 200 mg by mouth 2 (two) times daily.    . Cholecalciferol (VITAMIN D3) 400 units CAPS Take 400 Units by mouth daily.    . clopidogrel (PLAVIX) 75 MG tablet TAKE ONE (1) TABLET BY MOUTH  EVERY DAY 30 tablet 5  . diazepam (VALIUM) 5 MG tablet Take 0.5-1 tablets (2.5-5 mg total) by mouth every 12 (twelve) hours as needed for anxiety. 30 tablet 2  . dutasteride (AVODART) 0.5 MG capsule Take 0.5 mg by mouth every evening.     . Flaxseed, Linseed, (FLAX SEED OIL) 1000 MG CAPS Take 1,000 mg by mouth every evening.    . furosemide (LASIX) 20 MG tablet Take 1 tablet (20 mg total) by mouth daily. (Patient taking differently: Take 20 mg by mouth daily as needed for fluid or edema. ) 30 tablet 1  . hydrocortisone (ANUSOL-HC) 2.5 % rectal cream bid 30 g 1  . linaclotide (LINZESS) 290 MCG CAPS capsule Take 1 capsule (290 mcg total) by mouth daily. (Patient taking differently: Take 290 mcg by mouth daily as needed (constipation). ) 30 capsule 11  . losartan (  2015     Patient Care Team: Plotnikov, Evie Lacks, MD as PCP - General (Internal Medicine) Josue Hector, MD as Attending Physician (Cardiology) Jilda Roche Velora Mediate., MD (Urology) Irene Shipper, MD as Consulting Physician (Gastroenterology) Michael Boston, MD as Consulting Physician (General Surgery)   HPI  TIMONTHY HOVATER is a 83 y.o. male Seen follow up for ICD implantation-single chamber undertaken for aborted cardiac arrest in the setting of ischemic heart disease with prior bypass surgery. He also has a history of frequent PVCs  Catheterization 12/13 demonstrating patent grafts with an atretic LIMA; LV function was normal S/p CABG  He was seen last month is noted to have significant ectopy with a pattern of bigeminy.   Holter demonstrated approximately 13% PVCs:  repeat Holter 1/15 also demonstrated frequent PVCs in the range of 10-15% 1/18 Holter 31% PVCs   Echo 2015 February EF 45-50% Echo 2017 February EF 50-55%  Exercise tolerance is much improved following dual chamber upgrade. He is having no chest pain. Minimal shortness of breath. Has been no edema; he has been taking his diuretics as needed.[name] He also has been having lightheadedness. He notes that his overall sense of well-being is not as good when his blood pressure is less than 100.  Date Cr Hgb  5/18 0.97 12.2  7/18 1.31 10.5          Past Medical History:  Diagnosis Date  . AICD (automatic cardioverter/defibrillator) present 04/07/2017  . Allergy   . Anxiety   . BPH (benign prostatic hypertrophy)    elevated PSA Dr. Jilda Roche Bx 2010  . Cardiac arrest [facility])    a. out-of-hospital arrest 11/15/2012 - EF 40-45%, patent grafts on cath, received St. Jude AICD.  [name] Carotid disease, bilateral ([place])    a. 0-39% by doppers.  . Cataract    bil cataracts removed  . CHF (congestive heart failure) ([place])   . Coronary artery disease    a. s/p MI/CABG 2005. b. s/p cath at time of VF arrest 10/2013 - grafts  patent.  . Cough   . Diverticulosis   . Elevated LFTs    a. 10/2012 felt due to cardiac arrest - Hepatitis C Ab reactive from 11/17/2012>>Hep C RNA PCR negative 10/25/2012.   [name] GERD (gastroesophageal reflux disease)   . Hyperlipidemia   . Hypertension   . Internal hemorrhoids   . Myocardial infarction ([place])    2005 - CABG x 5 2005  . RBBB   . Tubular adenoma of colon   . Ventricular bigeminy    a. Event monitor 01/2013: NSR with PVCs and occ bigeminy.    Past Surgical History:  Procedure Laterality Date  . APPENDECTOMY    . CARDIAC CATHETERIZATION  2007   with patent graft anatomy atretic left internal mammary  artery to the LAD which is nonobstructive. Will restart study June 08, 2007  . CATARACT EXTRACTION W/PHACO Right 04/23/2016   Procedure: CATARACT EXTRACTION PHACO AND INTRAOCULAR LENS PLACEMENT (IOC);  Surgeon: Birder Robson, MD;  Location: [facility];  Service: Ophthalmology;  Laterality: Right;  [place] 1.09 AP% 19.3 CDE 13.38 Fluid pack lot # [id number]  . CHOLECYSTECTOMY N/A 06/05/2017   Procedure: LAPAROSCOPIC CHOLECYSTECTOMY WITH INTRAOPERATIVE CHOLANGIOGRAM ERAS PATHWAY POSSIBLE NEEDLE CORE BIOPSY OF LIVER;  Surgeon: Michael Boston, MD;  Location: [facility];  Service: General;  Laterality: N/A;  ERAS PATHWAY  . COLONOSCOPY    . CORONARY ARTERY BYPASS GRAFT  2005  . EYE SURGERY    .

## 2017-07-09 LAB — CBC WITH DIFFERENTIAL/PLATELET
Basophils Absolute: 0 10*3/uL (ref 0.0–0.2)
Basos: 1 %
EOS (ABSOLUTE): 0.3 10*3/uL (ref 0.0–0.4)
Eos: 8 %
Hematocrit: 34.2 % — ABNORMAL LOW (ref 37.5–51.0)
Hemoglobin: 11 g/dL — ABNORMAL LOW (ref 13.0–17.7)
Immature Grans (Abs): 0 10*3/uL (ref 0.0–0.1)
Immature Granulocytes: 0 %
Lymphocytes Absolute: 1.5 10*3/uL (ref 0.7–3.1)
Lymphs: 37 %
MCH: 28.4 pg (ref 26.6–33.0)
MCHC: 32.2 g/dL (ref 31.5–35.7)
MCV: 88 fL (ref 79–97)
Monocytes Absolute: 0.7 10*3/uL (ref 0.1–0.9)
Monocytes: 16 %
Neutrophils Absolute: 1.6 10*3/uL (ref 1.4–7.0)
Neutrophils: 38 %
Platelets: 133 10*3/uL — ABNORMAL LOW (ref 150–379)
RBC: 3.87 x10E6/uL — ABNORMAL LOW (ref 4.14–5.80)
RDW: 15.2 % (ref 12.3–15.4)
WBC: 4.2 10*3/uL (ref 3.4–10.8)

## 2017-07-09 LAB — BASIC METABOLIC PANEL
BUN/Creatinine Ratio: 17 (ref 10–24)
BUN: 19 mg/dL (ref 8–27)
CO2: 26 mmol/L (ref 20–29)
Calcium: 11.4 mg/dL — ABNORMAL HIGH (ref 8.6–10.2)
Chloride: 99 mmol/L (ref 96–106)
Creatinine, Ser: 1.12 mg/dL (ref 0.76–1.27)
GFR calc Af Amer: 70 mL/min/{1.73_m2} (ref >59)
GFR calc non Af Amer: 61 mL/min/{1.73_m2} (ref >59)
Glucose: 99 mg/dL (ref 65–99)
Potassium: 4.9 mmol/L (ref 3.5–5.2)
Sodium: 136 mmol/L (ref 134–144)

## 2017-07-14 ENCOUNTER — Telehealth: Payer: Self-pay | Admitting: *Deleted

## 2017-07-14 NOTE — Telephone Encounter (Signed)
-----   Message from Deboraha Sprang, MD sent at 07/12/2017 11:39 AM EDT ----- Please Inform Patient that labs are normal x anemia which is stable  Thanks

## 2017-07-14 NOTE — Telephone Encounter (Signed)
Patient's wife informed

## 2017-07-16 ENCOUNTER — Ambulatory Visit: Payer: Medicare Other | Admitting: Cardiovascular Disease

## 2017-08-11 ENCOUNTER — Encounter: Payer: Self-pay | Admitting: Internal Medicine

## 2017-08-11 ENCOUNTER — Ambulatory Visit (INDEPENDENT_AMBULATORY_CARE_PROVIDER_SITE_OTHER): Payer: Medicare Other | Admitting: Internal Medicine

## 2017-08-11 ENCOUNTER — Other Ambulatory Visit (INDEPENDENT_AMBULATORY_CARE_PROVIDER_SITE_OTHER): Payer: Medicare Other

## 2017-08-11 VITALS — BP 122/54 | HR 80 | Temp 98.0°F | Ht 67.0 in | Wt 143.0 lb

## 2017-08-11 DIAGNOSIS — Z23 Encounter for immunization: Secondary | ICD-10-CM

## 2017-08-11 DIAGNOSIS — I495 Sick sinus syndrome: Secondary | ICD-10-CM

## 2017-08-11 DIAGNOSIS — K811 Chronic cholecystitis: Secondary | ICD-10-CM | POA: Diagnosis not present

## 2017-08-11 DIAGNOSIS — E785 Hyperlipidemia, unspecified: Secondary | ICD-10-CM

## 2017-08-11 DIAGNOSIS — I208 Other forms of angina pectoris: Secondary | ICD-10-CM | POA: Diagnosis not present

## 2017-08-11 DIAGNOSIS — D649 Anemia, unspecified: Secondary | ICD-10-CM

## 2017-08-11 DIAGNOSIS — I10 Essential (primary) hypertension: Secondary | ICD-10-CM | POA: Diagnosis not present

## 2017-08-11 DIAGNOSIS — R634 Abnormal weight loss: Secondary | ICD-10-CM

## 2017-08-11 DIAGNOSIS — K819 Cholecystitis, unspecified: Secondary | ICD-10-CM

## 2017-08-11 LAB — BASIC METABOLIC PANEL
BUN: 17 mg/dL (ref 6–23)
CO2: 27 mEq/L (ref 19–32)
Calcium: 10.5 mg/dL (ref 8.4–10.5)
Chloride: 100 mEq/L (ref 96–112)
Creatinine, Ser: 0.97 mg/dL (ref 0.40–1.50)
GFR: 78.59 mL/min (ref >60.00)
Glucose, Bld: 105 mg/dL — ABNORMAL HIGH (ref 70–99)
Potassium: 4.2 mEq/L (ref 3.5–5.1)
Sodium: 131 mEq/L — ABNORMAL LOW (ref 135–145)

## 2017-08-11 LAB — HEPATIC FUNCTION PANEL
ALT: 25 U/L (ref 0–53)
AST: 28 U/L (ref 0–37)
Albumin: 4 g/dL (ref 3.5–5.2)
Alkaline Phosphatase: 156 U/L — ABNORMAL HIGH (ref 39–117)
Bilirubin, Direct: 0.2 mg/dL (ref 0.0–0.3)
Total Bilirubin: 0.6 mg/dL (ref 0.2–1.2)
Total Protein: 7.4 g/dL (ref 6.0–8.3)

## 2017-08-11 LAB — IBC PANEL
Iron: 30 ug/dL — ABNORMAL LOW (ref 42–165)
Saturation Ratios: 7.1 % — ABNORMAL LOW (ref 20.0–50.0)
Transferrin: 301 mg/dL (ref 212.0–360.0)

## 2017-08-11 LAB — TSH: TSH: 2.73 u[IU]/mL (ref 0.35–4.50)

## 2017-08-11 NOTE — Addendum Note (Signed)
Addended by: Karren Cobble on: 08/11/2017 10:56 AM   Modules accepted: Orders

## 2017-08-11 NOTE — Assessment & Plan Note (Signed)
Losartan, Atenolol 

## 2017-08-11 NOTE — Assessment & Plan Note (Signed)
Doing well 

## 2017-08-11 NOTE — Assessment & Plan Note (Signed)
Wt Readings from Last 3 Encounters:  08/11/17 143 lb (64.9 kg)  07/08/17 139 lb (63 kg)  06/05/17 151 lb 8 oz (68.7 kg)

## 2017-08-11 NOTE — Assessment & Plan Note (Signed)
On Lipitor 

## 2017-08-11 NOTE — Progress Notes (Signed)
Subjective:  Patient ID: [name] Christensen, male    DOB: August 04, 1932  Age: 83 y.o. MRN: [id number]  CC: No chief complaint on file.   HPI [name] Christensen presents for CAD, dyslipidemia, GB disease - s/p chole in 7/18  Outpatient Medications Prior to Visit  Medication Sig Dispense Refill  . acetaminophen (TYLENOL) 325 MG tablet Take 325 mg by mouth 3 (three) times daily as needed for mild pain.    [name] alfuzosin (UROXATRAL) 10 MG 24 hr tablet Take 10 mg by mouth every evening.     [name] aspirin EC 81 MG tablet Take 81 mg by mouth every evening.    [name] atenolol (TENORMIN) 50 MG tablet Take 0.5 tablets (25 mg total) by mouth daily. 90 tablet 2  . atorvastatin (LIPITOR) 20 MG tablet TAKE ONE (1) TABLET BY MOUTH EVERY DAY AT 6PM 90 tablet 2  . betamethasone dipropionate (DIPROLENE) 0.05 % cream Apply topically 2 (two) times daily. (Patient taking differently: Apply 1 application topically daily as needed (eczema). ) 30 g 3  . Calcium 200 MG TABS Take 200 mg by mouth 2 (two) times daily.    . Cholecalciferol (VITAMIN D3) 400 units CAPS Take 400 Units by mouth daily.    . clopidogrel (PLAVIX) 75 MG tablet TAKE ONE (1) TABLET BY MOUTH EVERY DAY 30 tablet 5  . diazepam (VALIUM) 5 MG tablet Take 0.5-1 tablets (2.5-5 mg total) by mouth every 12 (twelve) hours as needed for anxiety. 30 tablet 2  . dutasteride (AVODART) 0.5 MG capsule Take 0.5 mg by mouth every evening.     . furosemide (LASIX) 20 MG tablet Take 1 tablet (20 mg total) by mouth daily as needed for fluid or edema. 30 tablet 1  . hydrocortisone (ANUSOL-HC) 2.5 % rectal cream bid 30 g 1  . linaclotide (LINZESS) 290 MCG CAPS capsule Take 1 capsule (290 mcg total) by mouth daily. (Patient taking differently: Take 290 mcg by mouth daily as needed (constipation). ) 30 capsule 11  . losartan (COZAAR) 50 MG tablet Take 0.5 tablets (25 mg total) by mouth daily. 90 tablet 1  . Multiple Vitamin (MULTIVITAMIN WITH MINERALS) TABS Take 1 tablet by mouth daily.      . nitroGLYCERIN (NITROSTAT) 0.4 MG SL tablet PLACE ONE TABLET UNDER TONGUE EVERY 5 MINUTES AS NEEDED FOR CHEST PAIN (3 DOSES MAX) 25 tablet 0  . vitamin B-12 (CYANOCOBALAMIN) 500 MCG tablet Take 500 mcg by mouth every evening.     . Flaxseed, Linseed, (FLAX SEED OIL) 1000 MG CAPS Take 1,000 mg by mouth every evening.    . traMADol (ULTRAM) 50 MG tablet Take 1-2 tablets (50-100 mg total) by mouth every 6 (six) hours as needed for moderate pain or severe pain. 30 tablet 0   No facility-administered medications prior to visit.     ROS Review of Systems  Constitutional: Negative for appetite change, fatigue and unexpected weight change.  HENT: Negative for congestion, nosebleeds, sneezing, sore throat and trouble swallowing.   Eyes: Negative for itching and visual disturbance.  Respiratory: Negative for cough.   Cardiovascular: Negative for chest pain, palpitations and leg swelling.  Gastrointestinal: Negative for abdominal distention, blood in stool, diarrhea and nausea.  Genitourinary: Negative for frequency and hematuria.  Musculoskeletal: Negative for back pain, gait problem, joint swelling and neck pain.  Skin: Negative for rash.  Neurological: Negative for dizziness, tremors, speech difficulty and weakness.  Psychiatric/Behavioral: Negative for agitation, dysphoric mood and sleep disturbance. The patient is  not nervous/anxious.     Objective:  BP (!) 122/54   Pulse 80   Temp 98 F (36.7 C) (Oral)   Ht 5\' 7"  (1.702 m)   Wt 143 lb (64.9 kg)   SpO2 99%   BMI 22.40 kg/m   BP Readings from Last 3 Encounters:  08/11/17 (!) 122/54  07/08/17 (!) 92/50  06/06/17 (!) 102/52    Wt Readings from Last 3 Encounters:  08/11/17 143 lb (64.9 kg)  07/08/17 139 lb (63 kg)  06/05/17 151 lb 8 oz (68.7 kg)    Physical Exam  Constitutional: He is oriented to person, place, and time. He appears well-developed. No distress.  NAD  HENT:  Mouth/Throat: Oropharynx is clear and moist.   Eyes: Pupils are equal, round, and reactive to light. Conjunctivae are normal.  Neck: Normal range of motion. No JVD present. No thyromegaly present.  Cardiovascular: Normal rate, regular rhythm, normal heart sounds and intact distal pulses.  Exam reveals no gallop and no friction rub.   No murmur heard. Pulmonary/Chest: Effort normal and breath sounds normal. No respiratory distress. He has no wheezes. He has no rales. He exhibits no tenderness.  Abdominal: Soft. Bowel sounds are normal. He exhibits no distension and no mass. There is no tenderness. There is no rebound and no guarding.  Musculoskeletal: Normal range of motion. He exhibits no edema or tenderness.  Lymphadenopathy:    He has no cervical adenopathy.  Neurological: He is alert and oriented to person, place, and time. He has normal reflexes. No cranial nerve deficit. He exhibits normal muscle tone. He displays a negative Romberg sign. Coordination and gait normal.  Skin: Skin is warm and dry. No rash noted.  Psychiatric: He has a normal mood and affect. His behavior is normal. Judgment and thought content normal.    Lab Results  Component Value Date   WBC 4.2 07/08/2017   HGB 11.0 (L) 07/08/2017   HCT 34.2 (L) 07/08/2017   PLT 133 (L) 07/08/2017   GLUCOSE 99 07/08/2017   CHOL 118 09/11/2016   TRIG 62.0 09/11/2016   HDL 49.60 09/11/2016   LDLCALC 56 09/11/2016   ALT 29 04/24/2017   AST 39 (H) 04/24/2017   NA 136 07/08/2017   K 4.9 07/08/2017   CL 99 07/08/2017   CREATININE 1.12 07/08/2017   BUN 19 07/08/2017   CO2 26 07/08/2017   TSH 3.250 01/22/2017   PSA 12.45 (H) 02/06/2016   INR 1.1 04/02/2017   HGBA1C 6.3 (H) 11/15/2012    No results found.  Assessment & Plan:   There are no diagnoses linked to this encounter. I have discontinued Mr. Shoff Flax Seed Oil and traMADol. I am also having him maintain his alfuzosin, multivitamin with minerals, betamethasone dipropionate, linaclotide, diazepam, dutasteride,  nitroGLYCERIN, hydrocortisone, clopidogrel, Vitamin D3, vitamin B-12, acetaminophen, aspirin EC, Calcium, atorvastatin, losartan, atenolol, and furosemide.  No orders of the defined types were placed in this encounter.    Follow-up: No Follow-up on file.  [name], MD

## 2017-08-11 NOTE — Assessment & Plan Note (Signed)
Dr [name] New pacemaker 2018

## 2017-08-14 ENCOUNTER — Encounter (HOSPITAL_COMMUNITY): Payer: Self-pay | Admitting: *Deleted

## 2017-08-15 NOTE — Progress Notes (Signed)
Patient ID: [name] Christensen, male   DOB: 02/18/1932, 83 y.o.   MRN: [id number] [name] Christensen is a 83 y.o.  male   Seen in f/u for CAD, ischemic DCM with AICD and significant PVC burden  Seen mostly by EP lately. Had AICD upgraded to dual chamber device. May 2018  Catheterization 10/2012 demonstrating patent grafts with an atretic LIMA; LV function was normal  No angina or AICD d/c    Echo last 01/18/16 EF 50-55%  Mild MR  January 2018 had PVC burden 31% of beats  Some better after AICD upgrade in May 2018  Losartan and atenolol decreased for low BP by SK 07/08/17  Has had lap choly with Dr Franchot Erichsen 06/06/17 since last visit  Grand daughter died tragically in a MVA with her husband Hit head on by another driver   ROS: Denies fever, malais, weight loss, blurry vision, decreased visual acuity, cough, sputum, SOB, hemoptysis, pleuritic pain, palpitaitons, heartburn, abdominal pain, melena, lower extremity edema, claudication, or rash.  All other systems reviewed and negative  General: Affect appropriate Elderly white male  HEENT: normal Neck supple with no adenopathy JVP normal no bruits no thyromegaly Lungs clear with no wheezing and good diaphragmatic motion Heart:  S1/S2 no murmur, no rub, gallop or click  AICD under left clavicle  PMI normal Abdomen: benighn, BS positve, no tenderness, no AAA post lap choly  no bruit.  No HSM or HJR Distal pulses intact with no bruits No edema Neuro non-focal Skin warm and dry No muscular weakness    Current Outpatient Prescriptions  Medication Sig Dispense Refill  . acetaminophen (TYLENOL) 325 MG tablet Take 325 mg by mouth 3 (three) times daily as needed for mild pain.    [name] alfuzosin (UROXATRAL) 10 MG 24 hr tablet Take 10 mg by mouth every evening.     [name] aspirin EC 81 MG tablet Take 81 mg by mouth every evening.    [name] atenolol (TENORMIN) 50 MG tablet Take 0.5 tablets (25 mg total) by mouth daily. 90 tablet 2  . atorvastatin (LIPITOR) 20  MG tablet TAKE ONE (1) TABLET BY MOUTH EVERY DAY AT 6PM 90 tablet 2  . betamethasone dipropionate (DIPROLENE) 0.05 % cream Apply 1 application topically daily.    . Cholecalciferol (VITAMIN D3) 400 units CAPS Take 400 Units by mouth daily.    . clopidogrel (PLAVIX) 75 MG tablet TAKE ONE (1) TABLET BY MOUTH EVERY DAY 30 tablet 5  . diazepam (VALIUM) 5 MG tablet Take 0.5-1 tablets (2.5-5 mg total) by mouth every 12 (twelve) hours as needed for anxiety. 30 tablet 2  . dutasteride (AVODART) 0.5 MG capsule Take 0.5 mg by mouth every evening.     . furosemide (LASIX) 20 MG tablet Take 1 tablet (20 mg total) by mouth daily as needed for fluid or edema. 30 tablet 1  . hydrocortisone (ANUSOL-HC) 2.5 % rectal cream bid 30 g 1  . linaclotide (LINZESS) 290 MCG CAPS capsule Take 290 mcg by mouth as needed.    [name] losartan (COZAAR) 50 MG tablet Take 0.5 tablets (25 mg total) by mouth daily. 90 tablet 1  . Multiple Vitamin (MULTIVITAMIN WITH MINERALS) TABS Take 1 tablet by mouth daily.    . nitroGLYCERIN (NITROSTAT) 0.4 MG SL tablet PLACE ONE TABLET UNDER TONGUE EVERY 5 MINUTES AS NEEDED FOR CHEST PAIN (3 DOSES MAX) 25 tablet 0  . vitamin B-12 (CYANOCOBALAMIN) 500 MCG tablet Take 500 mcg by mouth every evening.  No current facility-administered medications for this visit.     Allergies  Ezetimibe; Penicillins; Pravastatin sodium; Rosuvastatin; and Niacin  Electrocardiogram:   05/18/15  SR RBBB PVC  10/30/15  SR rate 68 PR 256 PVC RBBB  No change  11/27/16 SR rate 77 PR 278 frequent PVCls RBBB old IMI inferior T wave changes   Assessment and Plan CAD:  Distant CABG no angina continue medical Rx including beta blocker   AICD:  Normal f/unction f/u Dr [name] Had RA lead and generator change May 2018  St Jude pulse generator  No DFT;s done at time of implant f/u EP regarding further suppression of PVC;s Apparently tried To increase lower pacing rate to 80's to suppress He has f/u with them in October   HTN:   Well controlled.  Continue current medications and low sodium Dash type diet.    Chol:   Lab Results  Component Value Date   LDLCALC 56 09/11/2016   PVC:  Asymptomatic continue beta blocker f/u Dr [name] has not recommended AAT for PVC burden   Jenkins Rouge

## 2017-08-18 ENCOUNTER — Encounter: Payer: Self-pay | Admitting: Cardiovascular Disease

## 2017-08-18 ENCOUNTER — Ambulatory Visit (INDEPENDENT_AMBULATORY_CARE_PROVIDER_SITE_OTHER): Payer: Medicare Other | Admitting: Cardiovascular Disease

## 2017-08-18 VITALS — BP 118/64 | HR 84 | Ht 67.0 in | Wt 142.8 lb

## 2017-08-18 DIAGNOSIS — I2510 Atherosclerotic heart disease of native coronary artery without angina pectoris: Secondary | ICD-10-CM

## 2017-08-18 DIAGNOSIS — I208 Other forms of angina pectoris: Secondary | ICD-10-CM

## 2017-08-18 NOTE — Patient Instructions (Signed)

## 2017-09-26 ENCOUNTER — Other Ambulatory Visit: Payer: Self-pay | Admitting: Internal Medicine

## 2017-10-06 ENCOUNTER — Other Ambulatory Visit: Payer: Self-pay | Admitting: Internal Medicine

## 2017-10-07 ENCOUNTER — Ambulatory Visit (INDEPENDENT_AMBULATORY_CARE_PROVIDER_SITE_OTHER): Payer: Medicare Other | Admitting: *Deleted

## 2017-10-07 ENCOUNTER — Telehealth: Payer: Self-pay | Admitting: Cardiology

## 2017-10-07 DIAGNOSIS — I469 Cardiac arrest, cause unspecified: Secondary | ICD-10-CM | POA: Diagnosis not present

## 2017-10-07 NOTE — Progress Notes (Signed)
Remote ICD transmission.   

## 2017-10-07 NOTE — Telephone Encounter (Signed)
LMOVM reminding pt to send remote transmission.   

## 2017-10-08 ENCOUNTER — Other Ambulatory Visit: Payer: Self-pay | Admitting: Internal Medicine

## 2017-10-08 ENCOUNTER — Encounter: Payer: Self-pay | Admitting: Internal Medicine

## 2017-10-08 DIAGNOSIS — R739 Hyperglycemia, unspecified: Secondary | ICD-10-CM

## 2017-10-08 LAB — CUP PACEART REMOTE DEVICE CHECK
Battery Remaining Longevity: 69 mo
Battery Remaining Percentage: 87 %
Battery Voltage: 3.02 V
Brady Statistic AP VP Percent: 73 %
Brady Statistic AP VS Percent: 24 %
Brady Statistic AS VP Percent: 2.4 %
Brady Statistic AS VS Percent: 1 %
Brady Statistic RA Percent Paced: 94 %
Brady Statistic RV Percent Paced: 75 %
Date Time Interrogation Session: 20181113190543
HighPow Impedance: 62 Ohm
HighPow Impedance: 62 Ohm
Implantable Lead Implant Date: 20131218
Implantable Lead Implant Date: 20180514
Implantable Lead Location: 753859
Implantable Lead Location: 753860
Implantable Lead Model: 5076
Implantable Pulse Generator Implant Date: 20180514
Lead Channel Impedance Value: 430 Ohm
Lead Channel Impedance Value: 480 Ohm
Lead Channel Pacing Threshold Amplitude: 0.5 V
Lead Channel Pacing Threshold Amplitude: 1.125 V
Lead Channel Pacing Threshold Pulse Width: 0.5 ms
Lead Channel Pacing Threshold Pulse Width: 0.5 ms
Lead Channel Sensing Intrinsic Amplitude: 12 mV
Lead Channel Sensing Intrinsic Amplitude: 2.8 mV
Lead Channel Setting Pacing Amplitude: 1.375
Lead Channel Setting Pacing Amplitude: 2.5 V
Lead Channel Setting Pacing Pulse Width: 0.5 ms
Lead Channel Setting Sensing Sensitivity: 0.5 mV
Pulse Gen Serial Number: 1245762

## 2017-10-10 ENCOUNTER — Encounter: Payer: Self-pay | Admitting: Cardiology

## 2017-10-10 ENCOUNTER — Other Ambulatory Visit (INDEPENDENT_AMBULATORY_CARE_PROVIDER_SITE_OTHER): Payer: Medicare Other

## 2017-10-10 DIAGNOSIS — R739 Hyperglycemia, unspecified: Secondary | ICD-10-CM | POA: Diagnosis not present

## 2017-10-10 LAB — BASIC METABOLIC PANEL
BUN: 20 mg/dL (ref 6–23)
CO2: 28 mEq/L (ref 19–32)
Calcium: 10.7 mg/dL — ABNORMAL HIGH (ref 8.4–10.5)
Chloride: 100 mEq/L (ref 96–112)
Creatinine, Ser: 1.06 mg/dL (ref 0.40–1.50)
GFR: 70.91 mL/min (ref >60.00)
Glucose, Bld: 112 mg/dL — ABNORMAL HIGH (ref 70–99)
Potassium: 4.6 mEq/L (ref 3.5–5.1)
Sodium: 134 mEq/L — ABNORMAL LOW (ref 135–145)

## 2017-10-10 LAB — HEMOGLOBIN A1C: Hgb A1c MFr Bld: 6.4 % (ref 4.6–6.5)

## 2017-10-11 ENCOUNTER — Other Ambulatory Visit: Payer: Self-pay | Admitting: Internal Medicine

## 2017-11-14 ENCOUNTER — Other Ambulatory Visit: Payer: Self-pay | Admitting: Internal Medicine

## 2017-11-23 DIAGNOSIS — R300 Dysuria: Secondary | ICD-10-CM | POA: Diagnosis not present

## 2017-11-23 DIAGNOSIS — R6883 Chills (without fever): Secondary | ICD-10-CM | POA: Diagnosis not present

## 2017-11-24 ENCOUNTER — Ambulatory Visit (INDEPENDENT_AMBULATORY_CARE_PROVIDER_SITE_OTHER): Payer: Medicare Other | Admitting: Internal Medicine

## 2017-11-24 ENCOUNTER — Encounter: Payer: Self-pay | Admitting: Internal Medicine

## 2017-11-24 ENCOUNTER — Other Ambulatory Visit (INDEPENDENT_AMBULATORY_CARE_PROVIDER_SITE_OTHER): Payer: Medicare Other

## 2017-11-24 DIAGNOSIS — I208 Other forms of angina pectoris: Secondary | ICD-10-CM

## 2017-11-24 DIAGNOSIS — F419 Anxiety disorder, unspecified: Secondary | ICD-10-CM | POA: Diagnosis not present

## 2017-11-24 DIAGNOSIS — R300 Dysuria: Secondary | ICD-10-CM | POA: Insufficient documentation

## 2017-11-24 DIAGNOSIS — R634 Abnormal weight loss: Secondary | ICD-10-CM

## 2017-11-24 DIAGNOSIS — R509 Fever, unspecified: Secondary | ICD-10-CM | POA: Insufficient documentation

## 2017-11-24 DIAGNOSIS — K5900 Constipation, unspecified: Secondary | ICD-10-CM

## 2017-11-24 DIAGNOSIS — K7460 Unspecified cirrhosis of liver: Secondary | ICD-10-CM

## 2017-11-24 HISTORY — DX: Fever, unspecified: R50.9

## 2017-11-24 LAB — CBC WITH DIFFERENTIAL/PLATELET
Basophils Absolute: 0 10*3/uL (ref 0.0–0.1)
Basophils Relative: 0.3 % (ref 0.0–3.0)
Eosinophils Absolute: 0.1 10*3/uL (ref 0.0–0.7)
Eosinophils Relative: 1.3 % (ref 0.0–5.0)
HCT: 37.4 % — ABNORMAL LOW (ref 39.0–52.0)
Hemoglobin: 12.2 g/dL — ABNORMAL LOW (ref 13.0–17.0)
Lymphocytes Relative: 21.3 % (ref 12.0–46.0)
Lymphs Abs: 1.5 10*3/uL (ref 0.7–4.0)
MCHC: 32.6 g/dL (ref 30.0–36.0)
MCV: 91.4 fl (ref 78.0–100.0)
Monocytes Absolute: 0.7 10*3/uL (ref 0.1–1.0)
Monocytes Relative: 9.5 % (ref 3.0–12.0)
Neutro Abs: 4.8 10*3/uL (ref 1.4–7.7)
Neutrophils Relative %: 67.6 % (ref 43.0–77.0)
Platelets: 93 10*3/uL — ABNORMAL LOW (ref 150.0–400.0)
RBC: 4.09 Mil/uL — ABNORMAL LOW (ref 4.22–5.81)
RDW: 14.7 % (ref 11.5–15.5)
WBC: 7.2 10*3/uL (ref 4.0–10.5)

## 2017-11-24 LAB — HEPATIC FUNCTION PANEL
ALT: 20 U/L (ref 0–53)
AST: 27 U/L (ref 0–37)
Albumin: 3.9 g/dL (ref 3.5–5.2)
Alkaline Phosphatase: 109 U/L (ref 39–117)
Bilirubin, Direct: 0.5 mg/dL — ABNORMAL HIGH (ref 0.0–0.3)
Total Bilirubin: 1.5 mg/dL — ABNORMAL HIGH (ref 0.2–1.2)
Total Protein: 7.3 g/dL (ref 6.0–8.3)

## 2017-11-24 LAB — BASIC METABOLIC PANEL
BUN: 20 mg/dL (ref 6–23)
CO2: 29 mEq/L (ref 19–32)
Calcium: 10.1 mg/dL (ref 8.4–10.5)
Chloride: 97 mEq/L (ref 96–112)
Creatinine, Ser: 1 mg/dL (ref 0.40–1.50)
GFR: 75.82 mL/min (ref >60.00)
Glucose, Bld: 95 mg/dL (ref 70–99)
Potassium: 4.7 mEq/L (ref 3.5–5.1)
Sodium: 131 mEq/L — ABNORMAL LOW (ref 135–145)

## 2017-11-24 LAB — SEDIMENTATION RATE: Sed Rate: 18 mm/hr (ref 0–20)

## 2017-11-24 MED ORDER — DIAZEPAM 5 MG PO TABS: 2.5000 mg | ORAL_TABLET | Freq: Two times a day (BID) | ORAL | 2 refills | Status: DC | PRN

## 2017-11-24 MED ORDER — ALIGN 4 MG PO CAPS: 1.0000 | ORAL_CAPSULE | Freq: Every day | ORAL | 0 refills | Status: DC

## 2017-11-24 NOTE — Assessment & Plan Note (Signed)
Diazepam prn seldom

## 2017-11-24 NOTE — Assessment & Plan Note (Signed)
Linzess 

## 2017-11-24 NOTE — Assessment & Plan Note (Signed)
LFT

## 2017-11-24 NOTE — Assessment & Plan Note (Signed)
Recurrent - ?prostatitis

## 2017-11-24 NOTE — Assessment & Plan Note (Signed)
Wt Readings from Last 3 Encounters:  11/24/17 147 lb (66.7 kg)  08/18/17 142 lb 12.8 oz (64.8 kg)  08/11/17 143 lb (64.9 kg)

## 2017-11-24 NOTE — Progress Notes (Signed)
Subjective:  Patient ID: [name] Christensen, male    DOB: 05-19-1932  Age: 83 y.o. MRN: [id number]  CC: No chief complaint on file.   HPI [name] Christensen presents for chills x3-4 days. The pt went to UC and was started on Cipro (took one so far). There was some dysuria sx's Labs w/WBC 11.0; UA was nl Wife fell and broke her hip 1 wk ago  Outpatient Medications Prior to Visit  Medication Sig Dispense Refill  . acetaminophen (TYLENOL) 325 MG tablet Take 325 mg by mouth 3 (three) times daily as needed for mild pain.    [name] alfuzosin (UROXATRAL) 10 MG 24 hr tablet Take 10 mg by mouth every evening.     [name] aspirin EC 81 MG tablet Take 81 mg by mouth every evening.    [name] atenolol (TENORMIN) 50 MG tablet TAKE ONE (1) TABLET BY MOUTH EVERY DAY 90 tablet 3  . atorvastatin (LIPITOR) 20 MG tablet TAKE ONE (1) TABLET BY MOUTH EVERY DAY AT 6PM 90 tablet 2  . betamethasone dipropionate (DIPROLENE) 0.05 % cream Apply 1 application topically daily.    . Cholecalciferol (VITAMIN D3) 400 units CAPS Take 400 Units by mouth daily.    . clopidogrel (PLAVIX) 75 MG tablet TAKE ONE (1) TABLET EACH DAY 30 tablet 11  . diazepam (VALIUM) 5 MG tablet Take 0.5-1 tablets (2.5-5 mg total) by mouth every 12 (twelve) hours as needed for anxiety. 30 tablet 2  . dutasteride (AVODART) 0.5 MG capsule Take 0.5 mg by mouth every evening.     . furosemide (LASIX) 20 MG tablet Take 1 tablet (20 mg total) by mouth daily as needed for fluid or edema. 30 tablet 1  . hydrocortisone (ANUSOL-HC) 2.5 % rectal cream bid 30 g 1  . linaclotide (LINZESS) 290 MCG CAPS capsule Take 290 mcg by mouth as needed.    [name] losartan (COZAAR) 50 MG tablet Take 0.5 tablets (25 mg total) by mouth daily. 90 tablet 1  . Multiple Vitamin (MULTIVITAMIN WITH MINERALS) TABS Take 1 tablet by mouth daily.    . nitroGLYCERIN (NITROSTAT) 0.4 MG SL tablet PLACE ONE TABLET UNDER TONGUE EVERY 5 MINUTES AS NEEDED FOR CHEST PAIN (3 DOSES MAX) 25 tablet 2  . vitamin B-12  (CYANOCOBALAMIN) 500 MCG tablet Take 500 mcg by mouth every evening.     [name] atenolol (TENORMIN) 50 MG tablet Take 0.5 tablets (25 mg total) by mouth daily. 90 tablet 2   No facility-administered medications prior to visit.     ROS Review of Systems  Constitutional: Positive for chills, diaphoresis and fatigue. Negative for appetite change, fever and unexpected weight change.  HENT: Negative for congestion, nosebleeds, sneezing, sore throat and trouble swallowing.   Eyes: Negative for itching and visual disturbance.  Respiratory: Negative for cough.   Cardiovascular: Negative for chest pain, palpitations and leg swelling.  Gastrointestinal: Negative for abdominal distention, blood in stool, diarrhea and nausea.  Genitourinary: Negative for frequency and hematuria.  Musculoskeletal: Positive for arthralgias and myalgias. Negative for back pain, gait problem, joint swelling and neck pain.  Skin: Negative for rash.  Neurological: Negative for dizziness, tremors, speech difficulty and weakness.  Psychiatric/Behavioral: Negative for agitation, dysphoric mood, sleep disturbance and suicidal ideas. The patient is not nervous/anxious.     Objective:  BP 126/72 (BP Location: Right Arm, Patient Position: Sitting, Cuff Size: Normal)   Pulse (!) 53   Temp 97.9 F (36.6 C) (Oral)   Ht 5\' 7"  (1.702 m)  Wt 147 lb (66.7 kg)   SpO2 98%   BMI 23.02 kg/m   BP Readings from Last 3 Encounters:  11/24/17 126/72  08/18/17 118/64  08/11/17 (!) 122/54    Wt Readings from Last 3 Encounters:  11/24/17 147 lb (66.7 kg)  08/18/17 142 lb 12.8 oz (64.8 kg)  08/11/17 143 lb (64.9 kg)    Physical Exam  Constitutional: He is oriented to person, place, and time. He appears well-developed. No distress.  NAD  HENT:  Mouth/Throat: Oropharynx is clear and moist.  Eyes: Conjunctivae are normal. Pupils are equal, round, and reactive to light.  Neck: Normal range of motion. No JVD present. No thyromegaly  present.  Cardiovascular: Normal rate, regular rhythm, normal heart sounds and intact distal pulses. Exam reveals no gallop and no friction rub.  No murmur heard. Pulmonary/Chest: Effort normal and breath sounds normal. No respiratory distress. He has no wheezes. He has no rales. He exhibits no tenderness.  Abdominal: Soft. Bowel sounds are normal. He exhibits no distension and no mass. There is no tenderness. There is no rebound and no guarding.  Musculoskeletal: Normal range of motion. He exhibits no edema or tenderness.  Lymphadenopathy:    He has no cervical adenopathy.  Neurological: He is alert and oriented to person, place, and time. He has normal reflexes. No cranial nerve deficit. He exhibits normal muscle tone. He displays a negative Romberg sign. Coordination and gait normal.  Skin: Skin is warm and dry. No rash noted.  Psychiatric: He has a normal mood and affect. His behavior is normal. Judgment and thought content normal.    Lab Results  Component Value Date   WBC 4.2 07/08/2017   HGB 11.0 (L) 07/08/2017   HCT 34.2 (L) 07/08/2017   PLT 133 (L) 07/08/2017   GLUCOSE 112 (H) 10/10/2017   CHOL 118 09/11/2016   TRIG 62.0 09/11/2016   HDL 49.60 09/11/2016   LDLCALC 56 09/11/2016   ALT 25 08/11/2017   AST 28 08/11/2017   NA 134 (L) 10/10/2017   K 4.6 10/10/2017   CL 100 10/10/2017   CREATININE 1.06 10/10/2017   BUN 20 10/10/2017   CO2 28 10/10/2017   TSH 2.73 08/11/2017   PSA 12.45 (H) 02/06/2016   INR 1.1 04/02/2017   HGBA1C 6.4 10/10/2017    No results found.  Assessment & Plan:   There are no diagnoses linked to this encounter. I am having [name] Christensen maintain his alfuzosin, multivitamin with minerals, diazepam, dutasteride, hydrocortisone, Vitamin D3, vitamin B-12, acetaminophen, aspirin EC, atorvastatin, losartan, furosemide, betamethasone dipropionate, linaclotide, nitroGLYCERIN, clopidogrel, and atenolol.  No orders of the defined types were placed in  this encounter.    Follow-up: No Follow-up on file.  Walker Kehr, MD

## 2017-12-10 ENCOUNTER — Ambulatory Visit: Payer: Medicare Other | Admitting: Internal Medicine

## 2017-12-22 ENCOUNTER — Ambulatory Visit (INDEPENDENT_AMBULATORY_CARE_PROVIDER_SITE_OTHER): Payer: PPO | Admitting: Internal Medicine

## 2017-12-22 ENCOUNTER — Encounter: Payer: Self-pay | Admitting: Internal Medicine

## 2017-12-22 DIAGNOSIS — L853 Xerosis cutis: Secondary | ICD-10-CM | POA: Diagnosis not present

## 2017-12-22 DIAGNOSIS — R509 Fever, unspecified: Secondary | ICD-10-CM | POA: Diagnosis not present

## 2017-12-22 DIAGNOSIS — I6523 Occlusion and stenosis of bilateral carotid arteries: Secondary | ICD-10-CM | POA: Diagnosis not present

## 2017-12-22 DIAGNOSIS — R634 Abnormal weight loss: Secondary | ICD-10-CM | POA: Diagnosis not present

## 2017-12-22 MED ORDER — BETAMETHASONE DIPROPIONATE 0.05 % EX CREA: 1.0000 "application " | TOPICAL_CREAM | Freq: Every day | CUTANEOUS | 2 refills | Status: DC

## 2017-12-22 NOTE — Progress Notes (Signed)
Subjective:  Patient ID: [name] Christensen, male    DOB: 05-28-1932  Age: 84 y.o. MRN: [id number]  CC: No chief complaint on file.   HPI [name] Christensen presents for chills - much better Wife is in PT for hip fx - better  No UTI sx's F/u CAD C/o dry skin   Outpatient Medications Prior to Visit  Medication Sig Dispense Refill  . acetaminophen (TYLENOL) 325 MG tablet Take 325 mg by mouth 3 (three) times daily as needed for mild pain.    [name] alfuzosin (UROXATRAL) 10 MG 24 hr tablet Take 10 mg by mouth every evening.     [name] aspirin EC 81 MG tablet Take 81 mg by mouth every evening.    [name] atenolol (TENORMIN) 50 MG tablet TAKE ONE (1) TABLET BY MOUTH EVERY DAY 90 tablet 3  . atorvastatin (LIPITOR) 20 MG tablet TAKE ONE (1) TABLET BY MOUTH EVERY DAY AT 6PM 90 tablet 2  . betamethasone dipropionate (DIPROLENE) 0.05 % cream Apply 1 application topically daily.    . Cholecalciferol (VITAMIN D3) 400 units CAPS Take 400 Units by mouth daily.    . clopidogrel (PLAVIX) 75 MG tablet TAKE ONE (1) TABLET EACH DAY 30 tablet 11  . diazepam (VALIUM) 5 MG tablet Take 0.5-1 tablets (2.5-5 mg total) by mouth every 12 (twelve) hours as needed for anxiety. 30 tablet 2  . dutasteride (AVODART) 0.5 MG capsule Take 0.5 mg by mouth every evening.     . furosemide (LASIX) 20 MG tablet Take 1 tablet (20 mg total) by mouth daily as needed for fluid or edema. 30 tablet 1  . hydrocortisone (ANUSOL-HC) 2.5 % rectal cream bid 30 g 1  . linaclotide (LINZESS) 290 MCG CAPS capsule Take 290 mcg by mouth as needed.    [name] losartan (COZAAR) 50 MG tablet Take 0.5 tablets (25 mg total) by mouth daily. 90 tablet 1  . Multiple Vitamin (MULTIVITAMIN WITH MINERALS) TABS Take 1 tablet by mouth daily.    . nitroGLYCERIN (NITROSTAT) 0.4 MG SL tablet PLACE ONE TABLET UNDER TONGUE EVERY 5 MINUTES AS NEEDED FOR CHEST PAIN (3 DOSES MAX) 25 tablet 2  . Probiotic Product (ALIGN) 4 MG CAPS Take 1 capsule (4 mg total) by mouth daily. 30 capsule 0    . vitamin B-12 (CYANOCOBALAMIN) 500 MCG tablet Take 500 mcg by mouth every evening.      No facility-administered medications prior to visit.     ROS Review of Systems  Constitutional: Negative for appetite change, fatigue and unexpected weight change.  HENT: Negative for congestion, nosebleeds, sneezing, sore throat and trouble swallowing.   Eyes: Negative for itching and visual disturbance.  Respiratory: Negative for cough.   Cardiovascular: Negative for chest pain, palpitations and leg swelling.  Gastrointestinal: Negative for abdominal distention, blood in stool, diarrhea and nausea.  Genitourinary: Negative for frequency and hematuria.  Musculoskeletal: Negative for back pain, gait problem, joint swelling and neck pain.  Skin: Negative for rash.  Neurological: Negative for dizziness, tremors, speech difficulty and weakness.  Psychiatric/Behavioral: Negative for agitation, dysphoric mood and sleep disturbance. The patient is not nervous/anxious.     Objective:  BP 122/68 (BP Location: Left Arm, Patient Position: Sitting, Cuff Size: Normal)   Pulse 85   Temp 98 F (36.7 C) (Oral)   Ht 5\' 7"  (1.702 m)   Wt 149 lb (67.6 kg)   SpO2 98%   BMI 23.34 kg/m   BP Readings from Last 3 Encounters:  12/22/17  122/68  11/24/17 126/72  08/18/17 118/64    Wt Readings from Last 3 Encounters:  12/22/17 149 lb (67.6 kg)  11/24/17 147 lb (66.7 kg)  08/18/17 142 lb 12.8 oz (64.8 kg)    Physical Exam  Constitutional: He is oriented to person, place, and time. He appears well-developed. No distress.  NAD  HENT:  Mouth/Throat: Oropharynx is clear and moist.  Eyes: Conjunctivae are normal. Pupils are equal, round, and reactive to light.  Neck: Normal range of motion. No JVD present. No thyromegaly present.  Cardiovascular: Normal rate, regular rhythm, normal heart sounds and intact distal pulses. Exam reveals no gallop and no friction rub.  No murmur heard. Pulmonary/Chest: Effort  normal and breath sounds normal. No respiratory distress. He has no wheezes. He has no rales. He exhibits no tenderness.  Abdominal: Soft. Bowel sounds are normal. He exhibits no distension and no mass. There is no tenderness. There is no rebound and no guarding.  Musculoskeletal: Normal range of motion. He exhibits no edema or tenderness.  Lymphadenopathy:    He has no cervical adenopathy.  Neurological: He is alert and oriented to person, place, and time. He has normal reflexes. No cranial nerve deficit. He exhibits normal muscle tone. He displays a negative Romberg sign. Coordination and gait normal.  Skin: Skin is warm and dry. No rash noted.  Psychiatric: He has a normal mood and affect. His behavior is normal. Judgment and thought content normal.    Lab Results  Component Value Date   WBC 7.2 11/24/2017   HGB 12.2 (L) 11/24/2017   HCT 37.4 (L) 11/24/2017   PLT 93.0 (L) 11/24/2017   GLUCOSE 95 11/24/2017   CHOL 118 09/11/2016   TRIG 62.0 09/11/2016   HDL 49.60 09/11/2016   LDLCALC 56 09/11/2016   ALT 20 11/24/2017   AST 27 11/24/2017   NA 131 (L) 11/24/2017   K 4.7 11/24/2017   CL 97 11/24/2017   CREATININE 1.00 11/24/2017   BUN 20 11/24/2017   CO2 29 11/24/2017   TSH 2.73 08/11/2017   PSA 12.45 (H) 02/06/2016   INR 1.1 04/02/2017   HGBA1C 6.4 10/10/2017    No results found.  Assessment & Plan:   There are no diagnoses linked to this encounter. I am having [name] Christensen maintain his alfuzosin, multivitamin with minerals, dutasteride, hydrocortisone, Vitamin D3, vitamin B-12, acetaminophen, aspirin EC, atorvastatin, losartan, furosemide, betamethasone dipropionate, linaclotide, nitroGLYCERIN, clopidogrel, atenolol, ALIGN, and diazepam.  No orders of the defined types were placed in this encounter.    Follow-up: No Follow-up on file.  Walker Kehr, MD

## 2017-12-22 NOTE — Patient Instructions (Signed)
Sebamed soap and lotion for dry skin

## 2017-12-22 NOTE — Assessment & Plan Note (Signed)
Resolved

## 2017-12-22 NOTE — Assessment & Plan Note (Signed)
Sebamed soap and lotion for dry skin

## 2017-12-22 NOTE — Assessment & Plan Note (Signed)
Recurrent x 2 years - resolved

## 2017-12-22 NOTE — Assessment & Plan Note (Signed)
US

## 2017-12-25 ENCOUNTER — Telehealth: Payer: Self-pay | Admitting: Internal Medicine

## 2017-12-25 ENCOUNTER — Other Ambulatory Visit: Payer: Self-pay | Admitting: *Deleted

## 2017-12-25 MED ORDER — CLOPIDOGREL BISULFATE 75 MG PO TABS: ORAL_TABLET | ORAL | 3 refills | Status: DC

## 2017-12-25 MED ORDER — NITROGLYCERIN 0.4 MG SL SUBL: SUBLINGUAL_TABLET | SUBLINGUAL | 2 refills | Status: DC

## 2017-12-25 MED ORDER — NITROGLYCERIN 0.4 MG SL SUBL: SUBLINGUAL_TABLET | SUBLINGUAL | 0 refills | Status: DC

## 2017-12-25 MED ORDER — ATENOLOL 50 MG PO TABS: ORAL_TABLET | ORAL | 0 refills | Status: DC

## 2017-12-25 MED ORDER — LINACLOTIDE 290 MCG PO CAPS: 290.0000 ug | ORAL_CAPSULE | ORAL | 3 refills | Status: DC | PRN

## 2017-12-25 MED ORDER — LOSARTAN POTASSIUM 50 MG PO TABS: 25.0000 mg | ORAL_TABLET | Freq: Every day | ORAL | 1 refills | Status: DC

## 2017-12-25 MED ORDER — ATORVASTATIN CALCIUM 20 MG PO TABS: ORAL_TABLET | ORAL | 0 refills | Status: DC

## 2017-12-25 MED ORDER — BETAMETHASONE DIPROPIONATE 0.05 % EX CREA: 1.0000 "application " | TOPICAL_CREAM | Freq: Every day | CUTANEOUS | 2 refills | Status: DC

## 2017-12-25 NOTE — Addendum Note (Signed)
Addended by: Karren Cobble on: 12/25/2017 01:04 PM   Modules accepted: Orders

## 2017-12-25 NOTE — Telephone Encounter (Signed)
Copied from [facility]. Topic: Inquiry >> Dec 25, 2017  9:52 AM Pricilla Handler wrote: Reason for CRM: Lovena Le from [facility] # [phone]) called requesting several medications that need new scripts for the patient. Lovena Le states that patient's former pharmacy closed, and patient is now utilizing [facility]. Lovena Le stated that the patient needs the following medications: **Clopidogrel (PLAVIX) 75 MG tablet** Lovena Le states that this medication is needed today, as patient is completely out). The other medications that Lovena Le states the patient need are as follows: NitroGLYCERIN (NITROSTAT) 0.4 MG SL tablet, Linaclotide (LINZESS) 290 MCG CAPS capsule, Atenolol (TENORMIN) 50 MG tablet,   Atorvastatin (LIPITOR) 20 MG tablet, Losartan (COZAAR) 50 MG tablet, and Betamethasone dipropionate (DIPROLENE) 0.05 % cream. All of the medications are needed ASAP per [facility], [place], [place] [address]. Phone Number [phone].       Thank You!!!

## 2017-12-25 NOTE — Telephone Encounter (Signed)
All requested medications refilled - per chart note 12/22/2017- patient to continue...   Patient has appointment 03/23/18.   The only medication that was requested that is a historical medication is Linzess and that will need a new Rx. Please review for refill.  Pharmacy : verified

## 2017-12-25 NOTE — Telephone Encounter (Signed)
Reviewed chart pt is up-to-date sent refills to [facility].../lmb  

## 2017-12-26 ENCOUNTER — Ambulatory Visit (INDEPENDENT_AMBULATORY_CARE_PROVIDER_SITE_OTHER)
Admission: RE | Admit: 2017-12-26 | Discharge: 2017-12-26 | Disposition: A | Discharge status: Home / Self Care | Payer: PPO | Source: Ambulatory Visit | Attending: Internal Medicine | Admitting: Internal Medicine

## 2017-12-26 ENCOUNTER — Telehealth: Payer: Self-pay | Admitting: Internal Medicine

## 2017-12-26 ENCOUNTER — Ambulatory Visit (INDEPENDENT_AMBULATORY_CARE_PROVIDER_SITE_OTHER): Payer: PPO | Admitting: Internal Medicine

## 2017-12-26 ENCOUNTER — Encounter: Payer: Self-pay | Admitting: Internal Medicine

## 2017-12-26 ENCOUNTER — Other Ambulatory Visit (INDEPENDENT_AMBULATORY_CARE_PROVIDER_SITE_OTHER): Payer: PPO

## 2017-12-26 VITALS — BP 132/74 | HR 81 | Temp 98.7°F | Ht 67.0 in | Wt 149.0 lb

## 2017-12-26 DIAGNOSIS — R0789 Other chest pain: Secondary | ICD-10-CM

## 2017-12-26 DIAGNOSIS — R05 Cough: Secondary | ICD-10-CM | POA: Diagnosis not present

## 2017-12-26 DIAGNOSIS — R6889 Other general symptoms and signs: Secondary | ICD-10-CM

## 2017-12-26 DIAGNOSIS — R509 Fever, unspecified: Secondary | ICD-10-CM

## 2017-12-26 DIAGNOSIS — R079 Chest pain, unspecified: Secondary | ICD-10-CM | POA: Diagnosis not present

## 2017-12-26 LAB — URINALYSIS
Bilirubin Urine: NEGATIVE
Hgb urine dipstick: NEGATIVE
Leukocytes, UA: NEGATIVE
Nitrite: NEGATIVE
Specific Gravity, Urine: 1.02 (ref 1.000–1.030)
Urine Glucose: NEGATIVE
Urobilinogen, UA: 0.2 (ref 0.0–1.0)
pH: 6 (ref 5.0–8.0)

## 2017-12-26 LAB — POC INFLUENZA A&B (BINAX/QUICKVUE)
Influenza A, POC: NEGATIVE
Influenza B, POC: NEGATIVE

## 2017-12-26 MED ORDER — LEVOFLOXACIN 500 MG PO TABS: 500.0000 mg | ORAL_TABLET | Freq: Every day | ORAL | 0 refills | Status: AC

## 2017-12-26 NOTE — Assessment & Plan Note (Signed)
  CXR due to R CP

## 2017-12-26 NOTE — Progress Notes (Signed)
Subjective:  Patient ID: [name] Christensen, male    DOB: 1932/11/13  Age: 84 y.o. MRN: [id number]  CC: No chief complaint on file.   HPI [name] Christensen presents for chills, fever, achy x 1 day C/o cough, SOB No n/v  Outpatient Medications Prior to Visit  Medication Sig Dispense Refill  . acetaminophen (TYLENOL) 325 MG tablet Take 325 mg by mouth 3 (three) times daily as needed for mild pain.    [name] alfuzosin (UROXATRAL) 10 MG 24 hr tablet Take 10 mg by mouth every evening.     [name] aspirin EC 81 MG tablet Take 81 mg by mouth every evening.    [name] atenolol (TENORMIN) 50 MG tablet TAKE ONE (1) TABLET BY MOUTH EVERY DAY 90 tablet 0  . atorvastatin (LIPITOR) 20 MG tablet TAKE ONE (1) TABLET BY MOUTH EVERY DAY AT 6PM 90 tablet 0  . betamethasone dipropionate (DIPROLENE) 0.05 % cream Apply 1 application topically daily. 45 g 2  . Cholecalciferol (VITAMIN D3) 400 units CAPS Take 400 Units by mouth daily.    . clopidogrel (PLAVIX) 75 MG tablet TAKE ONE (1) TABLET EACH DAY 30 tablet 3  . diazepam (VALIUM) 5 MG tablet Take 0.5-1 tablets (2.5-5 mg total) by mouth every 12 (twelve) hours as needed for anxiety. 30 tablet 2  . dutasteride (AVODART) 0.5 MG capsule Take 0.5 mg by mouth every evening.     . furosemide (LASIX) 20 MG tablet Take 1 tablet (20 mg total) by mouth daily as needed for fluid or edema. 30 tablet 1  . hydrocortisone (ANUSOL-HC) 2.5 % rectal cream bid 30 g 1  . linaclotide (LINZESS) 290 MCG CAPS capsule Take 1 capsule (290 mcg total) by mouth as needed. 30 capsule 3  . losartan (COZAAR) 50 MG tablet Take 0.5 tablets (25 mg total) by mouth daily. 90 tablet 1  . Multiple Vitamin (MULTIVITAMIN WITH MINERALS) TABS Take 1 tablet by mouth daily.    . nitroGLYCERIN (NITROSTAT) 0.4 MG SL tablet PLACE ONE TABLET UNDER TONGUE EVERY 5 MINUTES AS NEEDED FOR CHEST PAIN (3 DOSES MAX) 25 tablet 0  . Probiotic Product (ALIGN) 4 MG CAPS Take 1 capsule (4 mg total) by mouth daily. 30 capsule 0  . vitamin  B-12 (CYANOCOBALAMIN) 500 MCG tablet Take 500 mcg by mouth every evening.      No facility-administered medications prior to visit.     ROS Review of Systems  Constitutional: Positive for chills. Negative for appetite change, fatigue and unexpected weight change.  HENT: Negative for congestion, nosebleeds, sneezing, sore throat and trouble swallowing.   Eyes: Negative for itching and visual disturbance.  Respiratory: Negative for cough.   Cardiovascular: Negative for chest pain, palpitations and leg swelling.  Gastrointestinal: Negative for abdominal distention, blood in stool, diarrhea and nausea.  Genitourinary: Negative for frequency and hematuria.  Musculoskeletal: Positive for arthralgias and myalgias. Negative for back pain, gait problem, joint swelling and neck pain.  Skin: Negative for rash.  Neurological: Negative for dizziness, tremors, speech difficulty and weakness.  Psychiatric/Behavioral: Negative for agitation, dysphoric mood and sleep disturbance. The patient is not nervous/anxious.     Objective:  BP 132/74 (BP Location: Left Arm, Patient Position: Sitting, Cuff Size: Normal)   Pulse 81   Temp 98.7 F (37.1 C) (Oral)   Ht 5\' 7"  (1.702 m)   Wt 149 lb (67.6 kg)   SpO2 99%   BMI 23.34 kg/m   BP Readings from Last 3 Encounters:  12/26/17  132/74  12/22/17 122/68  11/24/17 126/72    Wt Readings from Last 3 Encounters:  12/26/17 149 lb (67.6 kg)  12/22/17 149 lb (67.6 kg)  11/24/17 147 lb (66.7 kg)    Physical Exam  Constitutional: He is oriented to person, place, and time. He appears well-developed. No distress.  NAD  HENT:  Mouth/Throat: Oropharynx is clear and moist.  Eyes: Conjunctivae are normal. Pupils are equal, round, and reactive to light.  Neck: Normal range of motion. No JVD present. No thyromegaly present.  Cardiovascular: Normal rate, regular rhythm, normal heart sounds and intact distal pulses. Exam reveals no gallop and no friction rub.  No  murmur heard. Pulmonary/Chest: Effort normal and breath sounds normal. No respiratory distress. He has no wheezes. He has no rales. He exhibits no tenderness.  Abdominal: Soft. Bowel sounds are normal. He exhibits no distension and no mass. There is no tenderness. There is no rebound and no guarding.  Musculoskeletal: Normal range of motion. He exhibits no edema or tenderness.  Lymphadenopathy:    He has no cervical adenopathy.  Neurological: He is alert and oriented to person, place, and time. He has normal reflexes. No cranial nerve deficit. He exhibits normal muscle tone. He displays a negative Romberg sign. Coordination and gait normal.  Skin: Skin is warm and dry. No rash noted.  Psychiatric: He has a normal mood and affect. His behavior is normal. Judgment and thought content normal.  non-toxic appearing eryth throat  Lab Results  Component Value Date   WBC 7.2 11/24/2017   HGB 12.2 (L) 11/24/2017   HCT 37.4 (L) 11/24/2017   PLT 93.0 (L) 11/24/2017   GLUCOSE 95 11/24/2017   CHOL 118 09/11/2016   TRIG 62.0 09/11/2016   HDL 49.60 09/11/2016   LDLCALC 56 09/11/2016   ALT 20 11/24/2017   AST 27 11/24/2017   NA 131 (L) 11/24/2017   K 4.7 11/24/2017   CL 97 11/24/2017   CREATININE 1.00 11/24/2017   BUN 20 11/24/2017   CO2 29 11/24/2017   TSH 2.73 08/11/2017   PSA 12.45 (H) 02/06/2016   INR 1.1 04/02/2017   HGBA1C 6.4 10/10/2017    No results found.  Assessment & Plan:   Diagnoses and all orders for this visit:  Flu-like symptoms -     POC Influenza A&B (Binax test)   I am having [name] maintain his alfuzosin, multivitamin with minerals, dutasteride, hydrocortisone, Vitamin D3, vitamin B-12, acetaminophen, aspirin EC, furosemide, ALIGN, diazepam, atenolol, atorvastatin, losartan, betamethasone dipropionate, linaclotide, clopidogrel, and nitroGLYCERIN.  No orders of the defined types were placed in this encounter.    Follow-up: No Follow-up on file.  Walker Kehr, MD

## 2017-12-26 NOTE — Telephone Encounter (Signed)
I calld pt - he started Levaquin

## 2017-12-26 NOTE — Patient Instructions (Signed)
Go to ER if worse 

## 2017-12-26 NOTE — Assessment & Plan Note (Addendum)
Levaquin if worse Flu test (-) CXR due to R CP UA

## 2017-12-29 ENCOUNTER — Ambulatory Visit (HOSPITAL_COMMUNITY)
Admission: RE | Admit: 2017-12-29 | Discharge: 2017-12-29 | Disposition: A | Discharge status: Home / Self Care | Payer: PPO | Source: Ambulatory Visit | Attending: Cardiology | Admitting: Cardiology

## 2017-12-29 DIAGNOSIS — I6523 Occlusion and stenosis of bilateral carotid arteries: Secondary | ICD-10-CM | POA: Insufficient documentation

## 2017-12-31 ENCOUNTER — Encounter: Payer: Self-pay | Admitting: Internal Medicine

## 2018-01-06 ENCOUNTER — Ambulatory Visit (INDEPENDENT_AMBULATORY_CARE_PROVIDER_SITE_OTHER): Payer: PPO | Admitting: *Deleted

## 2018-01-06 DIAGNOSIS — I469 Cardiac arrest, cause unspecified: Secondary | ICD-10-CM | POA: Diagnosis not present

## 2018-01-06 NOTE — Progress Notes (Signed)
Remote ICD transmission.   

## 2018-01-07 ENCOUNTER — Encounter: Payer: Self-pay | Admitting: Cardiology

## 2018-01-08 DIAGNOSIS — N401 Enlarged prostate with lower urinary tract symptoms: Secondary | ICD-10-CM | POA: Diagnosis not present

## 2018-01-08 DIAGNOSIS — R972 Elevated prostate specific antigen [PSA]: Secondary | ICD-10-CM | POA: Diagnosis not present

## 2018-01-13 ENCOUNTER — Ambulatory Visit (INDEPENDENT_AMBULATORY_CARE_PROVIDER_SITE_OTHER): Payer: PPO | Admitting: Internal Medicine

## 2018-01-13 ENCOUNTER — Encounter: Payer: Self-pay | Admitting: Internal Medicine

## 2018-01-13 VITALS — BP 138/78 | HR 80 | Ht 67.0 in | Wt 148.2 lb

## 2018-01-13 DIAGNOSIS — Z95810 Presence of automatic (implantable) cardiac defibrillator: Secondary | ICD-10-CM | POA: Diagnosis not present

## 2018-01-13 DIAGNOSIS — I469 Cardiac arrest, cause unspecified: Secondary | ICD-10-CM

## 2018-01-13 DIAGNOSIS — I4510 Unspecified right bundle-branch block: Secondary | ICD-10-CM

## 2018-01-13 DIAGNOSIS — I495 Sick sinus syndrome: Secondary | ICD-10-CM | POA: Diagnosis not present

## 2018-01-13 NOTE — Progress Notes (Signed)
2015     Patient Care Team: Plotnikov, Georgina Quint, MD as PCP - General (Internal Medicine) Wendall Stade, MD as Attending Physician (Cardiology) Lindell Noe Lehman Prom., MD (Urology) [name], MD as Consulting Physician (General Surgery) Pyrtle, Carie Caddy, MD as Consulting Physician (Gastroenterology)   HPI  [name] Christensen is a 84 y.o. male Seen follow up for ICD implantation-single chamber undertaken for aborted cardiac arrest in the setting of ischemic heart disease with prior bypass surgery. He also has a history of frequent PVCs  Catheterization 12/13 demonstrating patent grafts with an atretic LIMA; LV function was normal S/p CABG    DATE TEST PVC  1/15 Holter 13%  1/18 Holter 31%     DATE TEST EF   12/13 Cath   Patent Grafts  2/15 Echo   45-50 %   12/17 Echo  EF 50-55     Date Cr K Hgb  5/18 0.97  12.2  7/18 1.31  10.5  12.18  1.00 4.7 12.2   The patient denies chest pain, shortness of breath, nocturnal dyspnea, orthopnea or peripheral edema.  There have been no palpitations, lightheadedness or syncope.    Tolerating med   Past Medical History:  Diagnosis Date  . AICD (automatic cardioverter/defibrillator) present 04/07/2017  . Allergy   . Anxiety   . BPH (benign prostatic hypertrophy)    elevated PSA Dr. Lindell Noe Bx 2010  . Cardiac arrest [facility])    a. out-of-hospital arrest 11/17/2012 - EF 40-45%, patent grafts on cath, received St. Jude AICD.  [name] Carotid disease, bilateral (HCC)    a. 0-39% by doppers.  . Cataract    bil cataracts removed  . CHF (congestive heart failure) (HCC)   . Coronary artery disease    a. s/p MI/CABG 2005. b. s/p cath at time of VF arrest 10/2013 - grafts patent.  . Cough   . Diverticulosis   . Elevated LFTs    a. 10/2012 felt due to cardiac arrest - Hepatitis C Ab reactive from 11/24/2012>>Hep C RNA PCR negative 11/24/2012.   [name] GERD (gastroesophageal reflux disease)   . Hyperlipidemia   . Hypertension   . Internal  hemorrhoids   . Myocardial infarction (HCC)    2005 - CABG x 5 2005  . RBBB   . Tubular adenoma of colon   . Ventricular bigeminy    a. Event monitor 01/2013: NSR with PVCs and occ bigeminy.    Past Surgical History:  Procedure Laterality Date  . APPENDECTOMY    . CARDIAC CATHETERIZATION  2007   with patent graft anatomy atretic left internal mammary  artery to the LAD which is nonobstructive. Will restart study June 08, 2007  . CATARACT EXTRACTION W/PHACO Right 04/23/2016   Procedure: CATARACT EXTRACTION PHACO AND INTRAOCULAR LENS PLACEMENT (IOC);  Surgeon: [name], MD;  Location: [facility] ORS;  Service: Ophthalmology;  Laterality: Right;  [place] 1.09 AP% 19.3 CDE 13.38 Fluid pack lot # [id number]  . CHOLECYSTECTOMY N/A 06/05/2017   Procedure: LAPAROSCOPIC CHOLECYSTECTOMY WITH INTRAOPERATIVE CHOLANGIOGRAM ERAS PATHWAY POSSIBLE NEEDLE CORE BIOPSY OF LIVER;  Surgeon: [name], MD;  Location: [facility] OR;  Service: General;  Laterality: N/A;  ERAS PATHWAY  . COLONOSCOPY    . CORONARY ARTERY BYPASS GRAFT  2005  . EYE SURGERY    . ICD GENERATOR CHANGEOUT N/A 04/07/2017   Procedure: ICD Generator Changeout;  Surgeon: Duke Salvia, MD;  Location: [facility] INVASIVE CV LAB;  Service: Cardiovascular;  Laterality: N/A;  . IMPLANTABLE CARDIOVERTER  DEFIBRILLATOR IMPLANT N/A 10/27/2012   STJ single chamber ICD implanted by Dr Graciela Husbands for cardiac arrest   . INGUINAL HERNIA REPAIR Bilateral 01/17/2015   Procedure: BILATERAL LAPAROSCOPIC INGUINAL HERNIA REPAIR WITH LEFT FEMORAL HERNIA REPAIR;  Surgeon: [name], MD;  Location: [facility] OR;  Service: General;  Laterality: Bilateral;  . INSERTION OF MESH Bilateral 01/17/2015   Procedure: INSERTION OF MESH;  Surgeon: [name], MD;  Location: [facility] OR;  Service: General;  Laterality: Bilateral;  . LAPAROSCOPIC CHOLECYSTECTOMY  06/05/2017  . LEAD INSERTION N/A 04/07/2017   Procedure: RA Lead Insertion;  Surgeon: Duke Salvia, MD;  Location: [facility] INVASIVE CV LAB;   Service: Cardiovascular;  Laterality: N/A;  . LEAD REVISION/REPAIR N/A 04/08/2017   Procedure: Atrial Lead Revision/Repair;  Surgeon: Duke Salvia, MD;  Location: [facility] INVASIVE CV LAB;  Service: Cardiovascular;  Laterality: N/A;  . LEFT HEART CATHETERIZATION WITH CORONARY/GRAFT ANGIOGRAM N/A 11/19/2012   Procedure: LEFT HEART CATHETERIZATION WITH Isabel Caprice;  Surgeon: [name], MD;  Location: [facility] CATH LAB;  Service: Cardiovascular;  Laterality: N/A;  . LIVER BIOPSY N/A 06/05/2017   Procedure: SINGLE SITE LIVER BIOPSY ERAS PATHWAY;  Surgeon: [name], MD;  Location: [facility] OR;  Service: General;  Laterality: N/A;  ERAS PATHWAY  . LUMBAR FUSION  09/2007    Current Outpatient Medications  Medication Sig Dispense Refill  . acetaminophen (TYLENOL) 325 MG tablet Take 325 mg by mouth 3 (three) times daily as needed for mild pain.    [name] alfuzosin (UROXATRAL) 10 MG 24 hr tablet Take 10 mg by mouth every evening.     [name] aspirin EC 81 MG tablet Take 81 mg by mouth every evening.    [name] atenolol (TENORMIN) 50 MG tablet TAKE ONE (1) TABLET BY MOUTH EVERY DAY 90 tablet 0  . atorvastatin (LIPITOR) 20 MG tablet TAKE ONE (1) TABLET BY MOUTH EVERY DAY AT 6PM 90 tablet 0  . betamethasone dipropionate (DIPROLENE) 0.05 % cream Apply 1 application topically daily. 45 g 2  . Cholecalciferol (VITAMIN D3) 400 units CAPS Take 400 Units by mouth daily.    . clopidogrel (PLAVIX) 75 MG tablet TAKE ONE (1) TABLET EACH DAY 30 tablet 3  . diazepam (VALIUM) 5 MG tablet Take 0.5-1 tablets (2.5-5 mg total) by mouth every 12 (twelve) hours as needed for anxiety. 30 tablet 2  . dutasteride (AVODART) 0.5 MG capsule Take 0.5 mg by mouth every evening.     . hydrocortisone (ANUSOL-HC) 2.5 % rectal cream bid 30 g 1  . linaclotide (LINZESS) 290 MCG CAPS capsule Take 1 capsule (290 mcg total) by mouth as needed. 30 capsule 3  . losartan (COZAAR) 50 MG tablet Take 0.5 tablets (25 mg total) by mouth daily. 90 tablet 1  .  Multiple Vitamin (MULTIVITAMIN WITH MINERALS) TABS Take 1 tablet by mouth daily.    . nitroGLYCERIN (NITROSTAT) 0.4 MG SL tablet PLACE ONE TABLET UNDER TONGUE EVERY 5 MINUTES AS NEEDED FOR CHEST PAIN (3 DOSES MAX) 25 tablet 0  . Probiotic Product (ALIGN) 4 MG CAPS Take 1 capsule (4 mg total) by mouth daily. 30 capsule 0  . vitamin B-12 (CYANOCOBALAMIN) 500 MCG tablet Take 500 mcg by mouth every evening.      No current facility-administered medications for this visit.     Allergies  Allergen Reactions  . Ezetimibe Other (See Comments)    Pt was in coma for 6 days   numbness  . Penicillins Other (See Comments)  BLISTERS BETWEEN FINGERS  Has patient had a PCN reaction causing immediate rash, facial/tongue/throat swelling, SOB or lightheadedness with hypotension: unknown Has patient had a PCN reaction causing severe rash involving mucus membranes or skin necrosis: unknown Has patient had a PCN reaction that required hospitalization no Has patient had a PCN reaction occurring within the last 10 years: no If all of the above answers are "NO", then may proceed with Cephalosporin use.   . Pravastatin Sodium Other (See Comments)    Aches and pains in joints  . Rosuvastatin Other (See Comments)    MYALGIAS  . Niacin Anxiety    "Makes me feel nervous, jittery"    Review of Systems negative except from HPI and PMH  Physical Exam BP 138/78   Pulse 80   Ht 5\' 7"  (1.702 m)   Wt 148 lb 3.2 oz (67.2 kg)   SpO2 98%   BMI 23.21 kg/m  Well developed and nourished in no acute distress HENT normal Neck supple with JVP-flat Clear Device pocket well healed; without hematoma or erythema.  There is no tethering  Regular rate and rhythm, no murmurs or gallops Abd-soft with active BS No Clubbing cyanosis edema Skin-warm and dry A & Oriented  Grossly normal sensory and motor function    Cs  ECG Apacing with intrinsic conduction frequent PVCs with a right bundle superior axis  morphology 20/20/46  Assessment and  Plan  PVCs-right bundle branch superior axis    Cardiomyopathy   History of aborted sudden cardiac death    Implantable defibrillator-St. Jude   upgraded single-dual-chamber May 2018  Ischemic cardiomyopathy with prior CABG    Right bundle branch block.  Sinus bradycardia/chronotropic incompetence  Hypotension   He remains much improved following up upgrade to a dual-chamber device  No palpitations.  Interval improvement in LVEF, there is no indication for treating his PVCs.  We will continue to follow.  Heart rate excursion is adequate.  Tolerating guideline directed therapy.  We spent more than 50% of our >25 min visit in face to face counseling regarding the above

## 2018-01-13 NOTE — Patient Instructions (Addendum)
Medication Instructions:  Your physician recommends that you continue on your current medications as directed. Please refer to the Current Medication list given to you today.  Labwork: None ordered.  Testing/Procedures: None ordered.  Follow-Up: Your physician recommends that you schedule a follow-up appointment in: 6 months with Chanetta Marshall; 12 months with Dr [name]  Remote monitoring is used to monitor your Pacemaker from home. This monitoring reduces the number of office visits required to check your device to one time per year. It allows [place] to keep an eye on the functioning of your device to ensure it is working properly. You are scheduled for a device check from home on 04/07/2018. You may send your transmission at any time that day. If you have a wireless device, the transmission will be sent automatically. After your physician reviews your transmission, you will receive a postcard with your next transmission date.   Any Other Special Instructions Will Be Listed Below (If Applicable).     If you need a refill on your cardiac medications before your next appointment, please call your pharmacy.

## 2018-01-14 DIAGNOSIS — H43813 Vitreous degeneration, bilateral: Secondary | ICD-10-CM | POA: Diagnosis not present

## 2018-01-16 LAB — CUP PACEART REMOTE DEVICE CHECK
Battery Remaining Longevity: 68 mo
Battery Remaining Percentage: 85 %
Battery Voltage: 2.99 V
Brady Statistic AP VP Percent: 73 %
Brady Statistic AP VS Percent: 23 %
Brady Statistic AS VP Percent: 2.9 %
Brady Statistic AS VS Percent: 1 %
Brady Statistic RA Percent Paced: 94 %
Brady Statistic RV Percent Paced: 76 %
Date Time Interrogation Session: 20190212070017
HighPow Impedance: 65 Ohm
HighPow Impedance: 65 Ohm
Implantable Lead Implant Date: 20131218
Implantable Lead Implant Date: 20180514
Implantable Lead Location: 753859
Implantable Lead Location: 753860
Implantable Lead Model: 5076
Implantable Pulse Generator Implant Date: 20180514
Lead Channel Impedance Value: 450 Ohm
Lead Channel Impedance Value: 490 Ohm
Lead Channel Pacing Threshold Amplitude: 0.5 V
Lead Channel Pacing Threshold Amplitude: 1.125 V
Lead Channel Pacing Threshold Pulse Width: 0.5 ms
Lead Channel Pacing Threshold Pulse Width: 0.5 ms
Lead Channel Sensing Intrinsic Amplitude: 12 mV
Lead Channel Sensing Intrinsic Amplitude: 3.1 mV
Lead Channel Setting Pacing Amplitude: 1.375
Lead Channel Setting Pacing Amplitude: 2.5 V
Lead Channel Setting Pacing Pulse Width: 0.5 ms
Lead Channel Setting Sensing Sensitivity: 0.5 mV
Pulse Gen Serial Number: 1245762

## 2018-01-19 LAB — CUP PACEART INCLINIC DEVICE CHECK
Battery Remaining Longevity: 66 mo
Brady Statistic RA Percent Paced: 94 %
Brady Statistic RV Percent Paced: 76 %
Date Time Interrogation Session: 20190219202426
HighPow Impedance: 63 Ohm
Implantable Lead Implant Date: 20131218
Implantable Lead Implant Date: 20180514
Implantable Lead Location: 753859
Implantable Lead Location: 753860
Implantable Lead Model: 5076
Implantable Pulse Generator Implant Date: 20180514
Lead Channel Impedance Value: 450 Ohm
Lead Channel Impedance Value: 487.5 Ohm
Lead Channel Pacing Threshold Amplitude: 0.75 V
Lead Channel Pacing Threshold Amplitude: 1 V
Lead Channel Pacing Threshold Pulse Width: 0.5 ms
Lead Channel Pacing Threshold Pulse Width: 0.5 ms
Lead Channel Sensing Intrinsic Amplitude: 12 mV
Lead Channel Sensing Intrinsic Amplitude: 5 mV
Lead Channel Setting Pacing Amplitude: 1.375
Lead Channel Setting Pacing Amplitude: 2.5 V
Lead Channel Setting Pacing Pulse Width: 0.5 ms
Lead Channel Setting Sensing Sensitivity: 0.5 mV
Pulse Gen Serial Number: 1245762

## 2018-01-29 ENCOUNTER — Ambulatory Visit (INDEPENDENT_AMBULATORY_CARE_PROVIDER_SITE_OTHER): Payer: PPO | Admitting: Internal Medicine

## 2018-01-29 ENCOUNTER — Other Ambulatory Visit (INDEPENDENT_AMBULATORY_CARE_PROVIDER_SITE_OTHER): Payer: PPO

## 2018-01-29 ENCOUNTER — Encounter: Payer: Self-pay | Admitting: Internal Medicine

## 2018-01-29 DIAGNOSIS — R509 Fever, unspecified: Secondary | ICD-10-CM | POA: Diagnosis not present

## 2018-01-29 DIAGNOSIS — M2550 Pain in unspecified joint: Secondary | ICD-10-CM | POA: Insufficient documentation

## 2018-01-29 DIAGNOSIS — R918 Other nonspecific abnormal finding of lung field: Secondary | ICD-10-CM | POA: Diagnosis not present

## 2018-01-29 LAB — HEPATIC FUNCTION PANEL
ALT: 14 U/L (ref 0–53)
AST: 20 U/L (ref 0–37)
Albumin: 3.9 g/dL (ref 3.5–5.2)
Alkaline Phosphatase: 124 U/L — ABNORMAL HIGH (ref 39–117)
Bilirubin, Direct: 0.3 mg/dL (ref 0.0–0.3)
Total Bilirubin: 0.9 mg/dL (ref 0.2–1.2)
Total Protein: 7.5 g/dL (ref 6.0–8.3)

## 2018-01-29 LAB — CBC WITH DIFFERENTIAL/PLATELET
Basophils Absolute: 0 10*3/uL (ref 0.0–0.1)
Basophils Relative: 0.3 % (ref 0.0–3.0)
Eosinophils Absolute: 0.1 10*3/uL (ref 0.0–0.7)
Eosinophils Relative: 2 % (ref 0.0–5.0)
HCT: 38.1 % — ABNORMAL LOW (ref 39.0–52.0)
Hemoglobin: 12.6 g/dL — ABNORMAL LOW (ref 13.0–17.0)
Lymphocytes Relative: 16.6 % (ref 12.0–46.0)
Lymphs Abs: 1.2 10*3/uL (ref 0.7–4.0)
MCHC: 33 g/dL (ref 30.0–36.0)
MCV: 91.6 fl (ref 78.0–100.0)
Monocytes Absolute: 0.7 10*3/uL (ref 0.1–1.0)
Monocytes Relative: 9 % (ref 3.0–12.0)
Neutro Abs: 5.3 10*3/uL (ref 1.4–7.7)
Neutrophils Relative %: 72.1 % (ref 43.0–77.0)
Platelets: 87 10*3/uL — ABNORMAL LOW (ref 150.0–400.0)
RBC: 4.16 Mil/uL — ABNORMAL LOW (ref 4.22–5.81)
RDW: 15.2 % (ref 11.5–15.5)
WBC: 7.3 10*3/uL (ref 4.0–10.5)

## 2018-01-29 LAB — URINALYSIS
Bilirubin Urine: NEGATIVE
Hgb urine dipstick: NEGATIVE
Ketones, ur: NEGATIVE
Leukocytes, UA: NEGATIVE
Nitrite: NEGATIVE
Specific Gravity, Urine: 1.02 (ref 1.000–1.030)
Total Protein, Urine: NEGATIVE
Urine Glucose: NEGATIVE
Urobilinogen, UA: 0.2 (ref 0.0–1.0)
pH: 6.5 (ref 5.0–8.0)

## 2018-01-29 LAB — BASIC METABOLIC PANEL
BUN: 14 mg/dL (ref 6–23)
CO2: 27 mEq/L (ref 19–32)
Calcium: 10.8 mg/dL — ABNORMAL HIGH (ref 8.4–10.5)
Chloride: 101 mEq/L (ref 96–112)
Creatinine, Ser: 0.93 mg/dL (ref 0.40–1.50)
GFR: 82.41 mL/min (ref >60.00)
Glucose, Bld: 82 mg/dL (ref 70–99)
Potassium: 4.3 mEq/L (ref 3.5–5.1)
Sodium: 133 mEq/L — ABNORMAL LOW (ref 135–145)

## 2018-01-29 LAB — TSH: TSH: 1.57 u[IU]/mL (ref 0.35–4.50)

## 2018-01-29 LAB — CK: Total CK: 61 U/L (ref 7–232)

## 2018-01-29 LAB — SEDIMENTATION RATE: Sed Rate: 24 mm/hr — ABNORMAL HIGH (ref 0–20)

## 2018-01-29 MED ORDER — DIAZEPAM 5 MG PO TABS: 2.5000 mg | ORAL_TABLET | Freq: Two times a day (BID) | ORAL | 2 refills | Status: DC | PRN

## 2018-01-29 MED ORDER — LINACLOTIDE 290 MCG PO CAPS: 290.0000 ug | ORAL_CAPSULE | ORAL | 11 refills | Status: DC | PRN

## 2018-01-29 NOTE — Patient Instructions (Signed)
hold lipitor for now

## 2018-01-29 NOTE — Assessment & Plan Note (Signed)
RML Will get a chest CT

## 2018-01-29 NOTE — Assessment & Plan Note (Signed)
Hold Lipitor Lab Chest CT

## 2018-01-29 NOTE — Assessment & Plan Note (Signed)
Recurrent - labs

## 2018-01-29 NOTE — Progress Notes (Signed)
Subjective:  Patient ID: [name] Christensen, male    DOB: 1932-04-21  Age: 84 y.o. MRN: [id number]  CC: No chief complaint on file.   HPI [name] Christensen presents for [employer], pain all over - shoulders, legs 7/10. Weak in pm's. It is worse... Same as before - chills. Dr [name] heard something in the lungs.Marland KitchenMarland KitchenUsing Tylenol prn   Outpatient Medications Prior to Visit  Medication Sig Dispense Refill  . acetaminophen (TYLENOL) 325 MG tablet Take 325 mg by mouth 3 (three) times daily as needed for mild pain.    [name] alfuzosin (UROXATRAL) 10 MG 24 hr tablet Take 10 mg by mouth every evening.     [name] aspirin EC 81 MG tablet Take 81 mg by mouth every evening.    [name] atenolol (TENORMIN) 50 MG tablet TAKE ONE (1) TABLET BY MOUTH EVERY DAY 90 tablet 0  . atorvastatin (LIPITOR) 20 MG tablet TAKE ONE (1) TABLET BY MOUTH EVERY DAY AT 6PM 90 tablet 0  . betamethasone dipropionate (DIPROLENE) 0.05 % cream Apply 1 application topically daily. 45 g 2  . Cholecalciferol (VITAMIN D3) 400 units CAPS Take 400 Units by mouth daily.    . clopidogrel (PLAVIX) 75 MG tablet TAKE ONE (1) TABLET EACH DAY 30 tablet 3  . diazepam (VALIUM) 5 MG tablet Take 0.5-1 tablets (2.5-5 mg total) by mouth every 12 (twelve) hours as needed for anxiety. 30 tablet 2  . dutasteride (AVODART) 0.5 MG capsule Take 0.5 mg by mouth every evening.     . hydrocortisone (ANUSOL-HC) 2.5 % rectal cream bid 30 g 1  . linaclotide (LINZESS) 290 MCG CAPS capsule Take 1 capsule (290 mcg total) by mouth as needed. 30 capsule 3  . losartan (COZAAR) 50 MG tablet Take 0.5 tablets (25 mg total) by mouth daily. 90 tablet 1  . Multiple Vitamin (MULTIVITAMIN WITH MINERALS) TABS Take 1 tablet by mouth daily.    . nitroGLYCERIN (NITROSTAT) 0.4 MG SL tablet PLACE ONE TABLET UNDER TONGUE EVERY 5 MINUTES AS NEEDED FOR CHEST PAIN (3 DOSES MAX) 25 tablet 0  . Probiotic Product (ALIGN) 4 MG CAPS Take 1 capsule (4 mg total) by mouth daily. 30 capsule 0  . vitamin B-12  (CYANOCOBALAMIN) 500 MCG tablet Take 500 mcg by mouth every evening.      No facility-administered medications prior to visit.     ROS Review of Systems  Constitutional: Positive for chills and fatigue. Negative for appetite change, fever and unexpected weight change.  HENT: Negative for congestion, nosebleeds, sneezing, sore throat and trouble swallowing.   Eyes: Negative for itching and visual disturbance.  Respiratory: Negative for cough.   Cardiovascular: Negative for chest pain, palpitations and leg swelling.  Gastrointestinal: Negative for abdominal distention, blood in stool, diarrhea and nausea.  Genitourinary: Negative for frequency and hematuria.  Musculoskeletal: Positive for arthralgias, back pain and myalgias. Negative for gait problem, joint swelling and neck pain.  Skin: Negative for rash.  Neurological: Positive for weakness. Negative for dizziness, tremors and speech difficulty.  Psychiatric/Behavioral: Negative for agitation, dysphoric mood and sleep disturbance. The patient is not nervous/anxious.     Objective:  BP 126/72 (BP Location: Left Arm, Patient Position: Sitting, Cuff Size: Normal)   Pulse 84   Temp 98.5 F (36.9 C) (Oral)   Ht 5\' 7"  (1.702 m)   Wt 148 lb (67.1 kg)   SpO2 99%   BMI 23.18 kg/m   BP Readings from Last 3 Encounters:  01/29/18 126/72  01/13/18  138/78  12/26/17 132/74    Wt Readings from Last 3 Encounters:  01/29/18 148 lb (67.1 kg)  01/13/18 148 lb 3.2 oz (67.2 kg)  12/26/17 149 lb (67.6 kg)    Physical Exam  Constitutional: He is oriented to person, place, and time. He appears well-developed. No distress.  NAD  HENT:  Mouth/Throat: Oropharynx is clear and moist.  Eyes: Conjunctivae are normal. Pupils are equal, round, and reactive to light.  Neck: Normal range of motion. No JVD present. No thyromegaly present.  Cardiovascular: Normal rate, regular rhythm, normal heart sounds and intact distal pulses. Exam reveals no gallop  and no friction rub.  No murmur heard. Pulmonary/Chest: Effort normal and breath sounds normal. No respiratory distress. He has no wheezes. He has no rales. He exhibits no tenderness.  Abdominal: Soft. Bowel sounds are normal. He exhibits no distension and no mass. There is no tenderness. There is no rebound and no guarding.  Musculoskeletal: Normal range of motion. He exhibits tenderness. He exhibits no edema.  Lymphadenopathy:    He has no cervical adenopathy.  Neurological: He is alert and oriented to person, place, and time. He has normal reflexes. No cranial nerve deficit. He exhibits normal muscle tone. He displays a negative Romberg sign. Coordination and gait normal.  Skin: Skin is warm and dry. No rash noted.  Psychiatric: He has a normal mood and affect. His behavior is normal. Judgment and thought content normal.    Lab Results  Component Value Date   WBC 7.2 11/24/2017   HGB 12.2 (L) 11/24/2017   HCT 37.4 (L) 11/24/2017   PLT 93.0 (L) 11/24/2017   GLUCOSE 95 11/24/2017   CHOL 118 09/11/2016   TRIG 62.0 09/11/2016   HDL 49.60 09/11/2016   LDLCALC 56 09/11/2016   ALT 20 11/24/2017   AST 27 11/24/2017   NA 131 (L) 11/24/2017   K 4.7 11/24/2017   CL 97 11/24/2017   CREATININE 1.00 11/24/2017   BUN 20 11/24/2017   CO2 29 11/24/2017   TSH 2.73 08/11/2017   PSA 12.45 (H) 02/06/2016   INR 1.1 04/02/2017   HGBA1C 6.4 10/10/2017    No results found.  Assessment & Plan:   There are no diagnoses linked to this encounter. I am having [name] maintain his alfuzosin, multivitamin with minerals, dutasteride, hydrocortisone, Vitamin D3, vitamin B-12, acetaminophen, aspirin EC, ALIGN, diazepam, atenolol, atorvastatin, losartan, betamethasone dipropionate, linaclotide, clopidogrel, and nitroGLYCERIN.  No orders of the defined types were placed in this encounter.    Follow-up: No Follow-up on file.  Walker Kehr, MD

## 2018-01-30 LAB — RHEUMATOID FACTOR: Rhuematoid fact SerPl-aCnc: <14 IU/mL (ref <14)

## 2018-02-04 ENCOUNTER — Ambulatory Visit (INDEPENDENT_AMBULATORY_CARE_PROVIDER_SITE_OTHER)
Admission: RE | Admit: 2018-02-04 | Discharge: 2018-02-04 | Disposition: A | Discharge status: Home / Self Care | Payer: PPO | Source: Ambulatory Visit | Attending: Internal Medicine | Admitting: Internal Medicine

## 2018-02-04 DIAGNOSIS — R918 Other nonspecific abnormal finding of lung field: Secondary | ICD-10-CM

## 2018-02-04 DIAGNOSIS — M2550 Pain in unspecified joint: Secondary | ICD-10-CM

## 2018-02-04 DIAGNOSIS — R509 Fever, unspecified: Secondary | ICD-10-CM

## 2018-02-04 DIAGNOSIS — J90 Pleural effusion, not elsewhere classified: Secondary | ICD-10-CM | POA: Diagnosis not present

## 2018-02-04 LAB — CULTURE, BLOOD (SINGLE)
MICRO NUMBER:: 90293959
Result:: NO GROWTH
SPECIMEN QUALITY:: ADEQUATE

## 2018-02-04 MED ORDER — IOPAMIDOL (ISOVUE-300) INJECTION 61%
80.0000 mL | Freq: Once | INTRAVENOUS | Status: AC | PRN
  Administered 2018-02-04: 80 mL via INTRAVENOUS

## 2018-02-10 ENCOUNTER — Encounter: Payer: Self-pay | Admitting: Internal Medicine

## 2018-02-10 ENCOUNTER — Ambulatory Visit (INDEPENDENT_AMBULATORY_CARE_PROVIDER_SITE_OTHER): Payer: PPO | Admitting: Internal Medicine

## 2018-02-10 DIAGNOSIS — E785 Hyperlipidemia, unspecified: Secondary | ICD-10-CM

## 2018-02-10 DIAGNOSIS — R634 Abnormal weight loss: Secondary | ICD-10-CM

## 2018-02-10 DIAGNOSIS — R918 Other nonspecific abnormal finding of lung field: Secondary | ICD-10-CM

## 2018-02-10 MED ORDER — LORATADINE 10 MG PO TABS: 10.0000 mg | ORAL_TABLET | Freq: Every day | ORAL | 3 refills | Status: DC

## 2018-02-10 NOTE — Assessment & Plan Note (Signed)
F/u abn CXR/chest CT. The sx's improved after 10 d of the abx. The pt had similar illness in the late winter-spring 4-5 times over past 12 years.[name]

## 2018-02-10 NOTE — Assessment & Plan Note (Signed)
Wt Readings from Last 3 Encounters:  02/10/18 144 lb (65.3 kg)  01/29/18 148 lb (67.1 kg)  01/13/18 148 lb 3.2 oz (67.2 kg)

## 2018-02-10 NOTE — Progress Notes (Signed)
Subjective:  Patient ID: [name] Christensen, male    DOB: 04-17-1932  Age: 84 y.o. MRN: [id number]  CC: No chief complaint on file.   HPI [name] Christensen presents for a pneumonia/fever f/u. F/u abn CXR/chest CT. The sx's improved after 10 d of the abx. The pt had similar illness in the late winter-spring 4-5 times over past 12 years...  Outpatient Medications Prior to Visit  Medication Sig Dispense Refill  . acetaminophen (TYLENOL) 325 MG tablet Take 325 mg by mouth 3 (three) times daily as needed for mild pain.    [name] alfuzosin (UROXATRAL) 10 MG 24 hr tablet Take 10 mg by mouth every evening.     [name] aspirin EC 81 MG tablet Take 81 mg by mouth every evening.    [name] atenolol (TENORMIN) 50 MG tablet TAKE ONE (1) TABLET BY MOUTH EVERY DAY 90 tablet 0  . atorvastatin (LIPITOR) 20 MG tablet TAKE ONE (1) TABLET BY MOUTH EVERY DAY AT 6PM 90 tablet 0  . betamethasone dipropionate (DIPROLENE) 0.05 % cream Apply 1 application topically daily. 45 g 2  . Cholecalciferol (VITAMIN D3) 400 units CAPS Take 400 Units by mouth daily.    . clopidogrel (PLAVIX) 75 MG tablet TAKE ONE (1) TABLET EACH DAY 30 tablet 3  . diazepam (VALIUM) 5 MG tablet Take 0.5-1 tablets (2.5-5 mg total) by mouth every 12 (twelve) hours as needed for anxiety. 30 tablet 2  . dutasteride (AVODART) 0.5 MG capsule Take 0.5 mg by mouth every evening.     . hydrocortisone (ANUSOL-HC) 2.5 % rectal cream bid 30 g 1  . linaclotide (LINZESS) 290 MCG CAPS capsule Take 1 capsule (290 mcg total) by mouth as needed. 30 capsule 11  . losartan (COZAAR) 50 MG tablet Take 0.5 tablets (25 mg total) by mouth daily. 90 tablet 1  . Multiple Vitamin (MULTIVITAMIN WITH MINERALS) TABS Take 1 tablet by mouth daily.    . nitroGLYCERIN (NITROSTAT) 0.4 MG SL tablet PLACE ONE TABLET UNDER TONGUE EVERY 5 MINUTES AS NEEDED FOR CHEST PAIN (3 DOSES MAX) 25 tablet 0  . Probiotic Product (ALIGN) 4 MG CAPS Take 1 capsule (4 mg total) by mouth daily. 30 capsule 0  . vitamin  B-12 (CYANOCOBALAMIN) 500 MCG tablet Take 500 mcg by mouth every evening.      No facility-administered medications prior to visit.     ROS Review of Systems  Constitutional: Negative for appetite change, fatigue and unexpected weight change.  HENT: Negative for congestion, nosebleeds, sneezing, sore throat and trouble swallowing.   Eyes: Negative for itching and visual disturbance.  Respiratory: Negative for cough.   Cardiovascular: Negative for chest pain, palpitations and leg swelling.  Gastrointestinal: Negative for abdominal distention, blood in stool, diarrhea and nausea.  Genitourinary: Negative for frequency and hematuria.  Musculoskeletal: Negative for back pain, gait problem, joint swelling and neck pain.  Skin: Negative for rash.  Neurological: Negative for dizziness, tremors, speech difficulty and weakness.  Psychiatric/Behavioral: Negative for agitation, dysphoric mood and sleep disturbance. The patient is not nervous/anxious.     Objective:  BP 124/70 (BP Location: Left Arm, Patient Position: Sitting, Cuff Size: Normal)   Pulse 84   Temp 98 F (36.7 C) (Oral)   Ht 5\' 7"  (1.702 m)   Wt 144 lb (65.3 kg)   SpO2 99%   BMI 22.55 kg/m   BP Readings from Last 3 Encounters:  02/10/18 124/70  01/29/18 126/72  01/13/18 138/78    Wt Readings from  Last 3 Encounters:  02/10/18 144 lb (65.3 kg)  01/29/18 148 lb (67.1 kg)  01/13/18 148 lb 3.2 oz (67.2 kg)    Physical Exam  Constitutional: He is oriented to person, place, and time. He appears well-developed. No distress.  NAD  HENT:  Mouth/Throat: Oropharynx is clear and moist.  Eyes: Conjunctivae are normal. Pupils are equal, round, and reactive to light.  Neck: Normal range of motion. No JVD present. No thyromegaly present.  Cardiovascular: Normal rate, regular rhythm, normal heart sounds and intact distal pulses. Exam reveals no gallop and no friction rub.  No murmur heard. Pulmonary/Chest: Effort normal and  breath sounds normal. No respiratory distress. He has no wheezes. He has no rales. He exhibits no tenderness.  Abdominal: Soft. Bowel sounds are normal. He exhibits no distension and no mass. There is no tenderness. There is no rebound and no guarding.  Musculoskeletal: Normal range of motion. He exhibits no edema or tenderness.  Lymphadenopathy:    He has no cervical adenopathy.  Neurological: He is alert and oriented to person, place, and time. He has normal reflexes. No cranial nerve deficit. He exhibits normal muscle tone. He displays a negative Romberg sign. Coordination and gait normal.  Skin: Skin is warm and dry. No rash noted.  Psychiatric: He has a normal mood and affect. His behavior is normal. Judgment and thought content normal.    Lab Results  Component Value Date   WBC 7.3 01/29/2018   HGB 12.6 (L) 01/29/2018   HCT 38.1 (L) 01/29/2018   PLT 87.0 (L) 01/29/2018   GLUCOSE 82 01/29/2018   CHOL 118 09/11/2016   TRIG 62.0 09/11/2016   HDL 49.60 09/11/2016   LDLCALC 56 09/11/2016   ALT 14 01/29/2018   AST 20 01/29/2018   NA 133 (L) 01/29/2018   K 4.3 01/29/2018   CL 101 01/29/2018   CREATININE 0.93 01/29/2018   BUN 14 01/29/2018   CO2 27 01/29/2018   TSH 1.57 01/29/2018   PSA 12.45 (H) 02/06/2016   INR 1.1 04/02/2017   HGBA1C 6.4 10/10/2017    Ct Chest W Contrast  Result Date: 02/04/2018 CLINICAL DATA:  84 year old male treated for pneumonia in January and February. One week of chills, malaise, and fatigue EXAM: CT CHEST WITH CONTRAST TECHNIQUE: Multidetector CT imaging of the chest was performed during intravenous contrast administration. CONTRAST:  80 milliliters Isovue 300 COMPARISON:  Chest radiographs 12/26/2017 and earlier. Prior chest CT 01/16/2006. Prior CT Abdomen and Pelvis 12/13/2014. FINDINGS: Cardiovascular: Moderate cardiomegaly is new since the 2007 chest CT and appears progressed since 2016. Chronic left chest cardiac pacemaker type device. No pericardial  effusion. Extensive Calcified aortic atherosclerosis. Extensive calcified coronary artery atherosclerosis and/or stents. Prior CABG. Incidental reflux of venous contrast into the paravertebral venous system, the left external jugular vein, and multiple small venous collaterals about the left shoulder. This appears to reflect chronic left subclavian vein stenosis. Mediastinum/Nodes: Small calcified subcarinal and right hilar lymph nodes are unchanged since 2007 compatible with remote granulomatous disease. Other sub centimeter mediastinal nodes appear stable. No acute lymphadenopathy. Lungs/Pleura: Small volume retained secretions in the trachea above the carina. Major airways are patent. There is tree-in-bud nodular opacity throughout the posterior and lateral right upper lobe. There is patchy peribronchial and peripheral ground-glass opacity in the medial bilateral upper lobes. In the left upper lobe this ground-glass opacity progresses to consolidation at the left cardiophrenic angle. There is additional more solid-appearing peribronchial opacity on series 3, image 79. there is  also consolidation in the anterior right middle lobe in an area of bronchiectasis (series 3, image 126). In the right lower lobe there is tree-in-bud nodular opacity peripherally. In the lingula and left lower lobe there is mild more limited tree-in-bud nodular opacity mostly in the costophrenic angle. Trace pleural effusions, more apparent on the right. Upper Abdomen: Negative visible liver, spleen, pancreas, adrenal glands, kidneys, and bowel in the upper abdomen. Surgically absent gallbladder which is new since the 2016 CT. Musculoskeletal: Prior sternotomy. Osteopenia. No acute osseous abnormality identified. IMPRESSION: 1. Scattered inflammatory appearing pulmonary opacity in all lobes. This is a combination of tree-in-bud, ground-glass, and occasionally peripheral lung consolidation. Viral/atypical etiology is favored. Consideration  should include mycoplasma, and also PCP / PJP if immunocompromised. Superimposed Bronchiectasis is noted in the right middle lobe, so MAI would also be a consideration. 2. Trace associated bilateral pleural effusions. Trace retained secretions in the airway. 3. Cardiomegaly with progression since 2016. Advanced Aortic Atherosclerosis (ICD10-I70.0). Electronically Signed   By: Genevie Ann M.D.   On: 02/04/2018 16:50    Assessment & Plan:   There are no diagnoses linked to this encounter. I am having [name] Christensen maintain his alfuzosin, multivitamin with minerals, dutasteride, hydrocortisone, Vitamin D3, vitamin B-12, acetaminophen, aspirin EC, ALIGN, atenolol, atorvastatin, losartan, betamethasone dipropionate, clopidogrel, nitroGLYCERIN, linaclotide, and diazepam.  No orders of the defined types were placed in this encounter.    Follow-up: No Follow-up on file.  Walker Kehr, MD

## 2018-02-10 NOTE — Assessment & Plan Note (Signed)
Re-start Lipitor 

## 2018-02-12 ENCOUNTER — Ambulatory Visit: Payer: PPO | Admitting: Internal Medicine

## 2018-02-23 ENCOUNTER — Telehealth: Payer: Self-pay | Admitting: *Deleted

## 2018-02-23 NOTE — Telephone Encounter (Signed)
AF alert received via ICD home monitor. In AF since 02/20/18 morning- pt reports having a belly ache and being fatigued. SK out of office x 2 weeks, noted SCAF last week- treat for episodes lasting longer than 24hrs. Scheduled with AF Clinic 10am 02/25/18. Directions and parking instructions given. Pt appreciative.

## 2018-02-25 ENCOUNTER — Encounter (HOSPITAL_COMMUNITY): Payer: Self-pay | Admitting: Nurse Practitioner

## 2018-02-25 ENCOUNTER — Ambulatory Visit (HOSPITAL_COMMUNITY)
Admission: RE | Admit: 2018-02-25 | Discharge: 2018-02-25 | Disposition: A | Discharge status: Home / Self Care | Payer: PPO | Source: Ambulatory Visit | Attending: Nurse Practitioner | Admitting: Nurse Practitioner

## 2018-02-25 ENCOUNTER — Other Ambulatory Visit (HOSPITAL_COMMUNITY): Payer: Self-pay | Admitting: *Deleted

## 2018-02-25 ENCOUNTER — Ambulatory Visit: Payer: PPO | Admitting: Cardiovascular Disease

## 2018-02-25 VITALS — BP 122/68 | HR 85 | Ht 67.0 in | Wt 149.0 lb

## 2018-02-25 DIAGNOSIS — I4510 Unspecified right bundle-branch block: Secondary | ICD-10-CM | POA: Diagnosis not present

## 2018-02-25 DIAGNOSIS — Z880 Allergy status to penicillin: Secondary | ICD-10-CM | POA: Diagnosis not present

## 2018-02-25 DIAGNOSIS — I2510 Atherosclerotic heart disease of native coronary artery without angina pectoris: Secondary | ICD-10-CM | POA: Insufficient documentation

## 2018-02-25 DIAGNOSIS — I252 Old myocardial infarction: Secondary | ICD-10-CM | POA: Insufficient documentation

## 2018-02-25 DIAGNOSIS — Z9049 Acquired absence of other specified parts of digestive tract: Secondary | ICD-10-CM | POA: Diagnosis not present

## 2018-02-25 DIAGNOSIS — Z79899 Other long term (current) drug therapy: Secondary | ICD-10-CM | POA: Diagnosis not present

## 2018-02-25 DIAGNOSIS — K219 Gastro-esophageal reflux disease without esophagitis: Secondary | ICD-10-CM | POA: Insufficient documentation

## 2018-02-25 DIAGNOSIS — F419 Anxiety disorder, unspecified: Secondary | ICD-10-CM | POA: Diagnosis not present

## 2018-02-25 DIAGNOSIS — Z888 Allergy status to other drugs, medicaments and biological substances status: Secondary | ICD-10-CM | POA: Insufficient documentation

## 2018-02-25 DIAGNOSIS — Z951 Presence of aortocoronary bypass graft: Secondary | ICD-10-CM | POA: Diagnosis not present

## 2018-02-25 DIAGNOSIS — Z8249 Family history of ischemic heart disease and other diseases of the circulatory system: Secondary | ICD-10-CM | POA: Insufficient documentation

## 2018-02-25 DIAGNOSIS — J069 Acute upper respiratory infection, unspecified: Secondary | ICD-10-CM | POA: Insufficient documentation

## 2018-02-25 DIAGNOSIS — Z95810 Presence of automatic (implantable) cardiac defibrillator: Secondary | ICD-10-CM | POA: Insufficient documentation

## 2018-02-25 DIAGNOSIS — I4891 Unspecified atrial fibrillation: Secondary | ICD-10-CM | POA: Diagnosis not present

## 2018-02-25 DIAGNOSIS — N400 Enlarged prostate without lower urinary tract symptoms: Secondary | ICD-10-CM | POA: Insufficient documentation

## 2018-02-25 DIAGNOSIS — Z7982 Long term (current) use of aspirin: Secondary | ICD-10-CM | POA: Diagnosis not present

## 2018-02-25 DIAGNOSIS — Z8674 Personal history of sudden cardiac arrest: Secondary | ICD-10-CM | POA: Insufficient documentation

## 2018-02-25 DIAGNOSIS — I110 Hypertensive heart disease with heart failure: Secondary | ICD-10-CM | POA: Diagnosis not present

## 2018-02-25 DIAGNOSIS — Z9889 Other specified postprocedural states: Secondary | ICD-10-CM | POA: Diagnosis not present

## 2018-02-25 DIAGNOSIS — I509 Heart failure, unspecified: Secondary | ICD-10-CM | POA: Insufficient documentation

## 2018-02-25 DIAGNOSIS — Z7901 Long term (current) use of anticoagulants: Secondary | ICD-10-CM | POA: Insufficient documentation

## 2018-02-25 DIAGNOSIS — E785 Hyperlipidemia, unspecified: Secondary | ICD-10-CM | POA: Insufficient documentation

## 2018-02-25 MED ORDER — APIXABAN 5 MG PO TABS: 5.0000 mg | ORAL_TABLET | Freq: Two times a day (BID) | ORAL | 3 refills | Status: DC

## 2018-02-25 MED ORDER — APIXABAN 5 MG PO TABS: 5.0000 mg | ORAL_TABLET | Freq: Two times a day (BID) | ORAL | 0 refills | Status: DC

## 2018-02-25 NOTE — Progress Notes (Addendum)
CO2 27 01/29/2018   GLUCOSE 82 01/29/2018   BUN 14 01/29/2018   CREATININE 0.93 01/29/2018   CALCIUM 10.8 (H) 01/29/2018   PHOS 2.6 11/09/2012   MG 1.9 02/06/2017   Lab Results   Component Value Date   INR 1.1 04/02/2017   Lab Results  Component Value Date   CHOL 118 09/11/2016   HDL 49.60 09/11/2016   LDLCALC 56 09/11/2016   TRIG 62.0 09/11/2016     GEN- The patient is well appearing, alert and oriented x 3 today.   Head- normocephalic, atraumatic Eyes-  Sclera clear, conjunctiva pink Ears- hearing intact Oropharynx- clear Neck- supple, no JVP Lymph- no cervical lymphadenopathy Lungs- Clear to ausculation bilaterally, normal work of breathing Heart- Regular rate and rhythm, no murmurs, rubs or gallops, PMI not laterally displaced GI- soft, NT, ND, + BS Extremities- no clubbing, cyanosis, or edema MS- no significant deformity or atrophy Skin- no rash or lesion Psych- euthymic mood, full affect Neuro- strength and sensation are intact  EKG- v paced at 85 bpm, qrs int 210 ms, qtc 547 ms, occas PVC    Assessment and Plan: 1. New onset atrial flutter General education re arrythmia No triggers identified other than several URI infections since January He appears to be rate control and minimally symptomatic For now, continue atenolol at current dose  2. Chadsvasc score of at least 5 Discussed available anticoagulation for prevention of stroke with pt and wife, specifically Eliquis, xarelto and coumadin Pt would like to start Eliquis Rx 5 mg bid with 30 day free card I messaged Dr. Johnsie Cancel and he is ok to stop plavix and continue  ASA( for CAD)  to minimize bleeding risk  Bleeding precautions discussed  I will get f/u with Dr. [name] in [place] in 2-3 weeks Afib clinic as needed  [place]. Anisia Leija, [facility] [address] [place], [place] [address] [phone]  Primary Care Physician: Cassandria Anger, MD Referring Physician:Device clinic EP: Dr. [name] Cardiologist: Dr. Ovidio Hanger is a 84 y.o. male with a h/o out of hospital cardiac arrest 2013, received St. Jude ICD, CAD, s/p CABG in 2005, PVC's. HTN, CHF that is in the afib clinic for new onset atrial flutter on device report. Pt is asymptomatic and does not feel any palpitations, possibly slightly more fatigued, but overall has not changed much form his baseline. He has been on asa and plavix for years for his h/o CAD. He is v paced today at 82 bpm, with occasional PVC.Pt has had issues with recurrent pneumonia since January of this year. Last antibiotic use first of March. He is pending consult with Dr. Melvyn Novas this month. He does not drink alcohol, no tobacco, no excessive caffeine, pt states that he does not snore or stop breathing while sleeping.   Today, he denies symptoms of palpitations, chest pain, shortness of breath, orthopnea, PND, lower extremity edema, dizziness, presyncope, syncope, or neurologic sequela. The patient is tolerating medications without difficulties and is otherwise without complaint today.   Past Medical History:  Diagnosis Date  . AICD (automatic cardioverter/defibrillator) present 04/07/2017  . Allergy   . Anxiety   . BPH (benign prostatic hypertrophy)    elevated PSA Dr. Jilda Roche Bx 2010  . Cardiac arrest [facility])    a. out-of-hospital arrest 11/17/2012 - EF 40-45%, patent grafts on cath, received St. Jude AICD.  [name] Carotid disease, bilateral ([place])    a. 0-39% by doppers.  . Cataract    bil cataracts removed  . CHF (congestive heart failure) ([place])   . Coronary artery disease    a. s/p MI/CABG 2005. b. s/p cath at time of VF arrest 10/2013 - grafts patent.  . Cough   . Diverticulosis   . Elevated LFTs    a. 10/2012 felt due to cardiac arrest - Hepatitis C Ab reactive from 11/07/2012>>Hep C RNA PCR negative 11/06/2012.   [name] GERD (gastroesophageal  reflux disease)   . Hyperlipidemia   . Hypertension   . Internal hemorrhoids   . Myocardial infarction ([place])    2005 - CABG x 5 2005  . RBBB   . Tubular adenoma of colon   . Ventricular bigeminy    a. Event monitor 01/2013: NSR with PVCs and occ bigeminy.   Past Surgical History:  Procedure Laterality Date  . APPENDECTOMY    . CARDIAC CATHETERIZATION  2007   with patent graft anatomy atretic left internal mammary  artery to the LAD which is nonobstructive. Will restart study June 08, 2007  . CATARACT EXTRACTION W/PHACO Right 04/23/2016   Procedure: CATARACT EXTRACTION PHACO AND INTRAOCULAR LENS PLACEMENT (IOC);  Surgeon: Birder Robson, MD;  Location: [facility] ORS;  Service: Ophthalmology;  Laterality: Right;  [place] 1.09 AP% 19.3 CDE 13.38 Fluid pack lot # [id number]  . CHOLECYSTECTOMY N/A 06/05/2017   Procedure: LAPAROSCOPIC CHOLECYSTECTOMY WITH INTRAOPERATIVE CHOLANGIOGRAM ERAS PATHWAY POSSIBLE NEEDLE CORE BIOPSY OF LIVER;  Surgeon: Michael Boston, MD;  Location: [facility];  Service: General;  Laterality: N/A;  ERAS PATHWAY  . COLONOSCOPY    . CORONARY ARTERY BYPASS GRAFT  2005  . EYE SURGERY    . ICD GENERATOR CHANGEOUT N/A 04/07/2017   Procedure: ICD Generator Changeout;  Surgeon: Deboraha Sprang, MD;  Location: [facility];  Service: Cardiovascular;  Laterality: N/A;  . IMPLANTABLE CARDIOVERTER DEFIBRILLATOR IMPLANT N/A 11/24/2012   STJ single chamber ICD implanted by  CO2 27 01/29/2018   GLUCOSE 82 01/29/2018   BUN 14 01/29/2018   CREATININE 0.93 01/29/2018   CALCIUM 10.8 (H) 01/29/2018   PHOS 2.6 11/09/2012   MG 1.9 02/06/2017   Lab Results   Component Value Date   INR 1.1 04/02/2017   Lab Results  Component Value Date   CHOL 118 09/11/2016   HDL 49.60 09/11/2016   LDLCALC 56 09/11/2016   TRIG 62.0 09/11/2016     GEN- The patient is well appearing, alert and oriented x 3 today.   Head- normocephalic, atraumatic Eyes-  Sclera clear, conjunctiva pink Ears- hearing intact Oropharynx- clear Neck- supple, no JVP Lymph- no cervical lymphadenopathy Lungs- Clear to ausculation bilaterally, normal work of breathing Heart- Regular rate and rhythm, no murmurs, rubs or gallops, PMI not laterally displaced GI- soft, NT, ND, + BS Extremities- no clubbing, cyanosis, or edema MS- no significant deformity or atrophy Skin- no rash or lesion Psych- euthymic mood, full affect Neuro- strength and sensation are intact  EKG- v paced at 85 bpm, qrs int 210 ms, qtc 547 ms, occas PVC    Assessment and Plan: 1. New onset atrial flutter General education re arrythmia No triggers identified other than several URI infections since January He appears to be rate control and minimally symptomatic For now, continue atenolol at current dose  2. Chadsvasc score of at least 5 Discussed available anticoagulation for prevention of stroke with pt and wife, specifically Eliquis, xarelto and coumadin Pt would like to start Eliquis Rx 5 mg bid with 30 day free card I messaged Dr. Johnsie Cancel and he is ok to stop plavix and continue  ASA( for CAD)  to minimize bleeding risk  Bleeding precautions discussed  I will get f/u with Dr. [name] in [place] in 2-3 weeks Afib clinic as needed  [place]. Anisia Leija, [facility] [address] [place], [place] [address] [phone]  CO2 27 01/29/2018   GLUCOSE 82 01/29/2018   BUN 14 01/29/2018   CREATININE 0.93 01/29/2018   CALCIUM 10.8 (H) 01/29/2018   PHOS 2.6 11/09/2012   MG 1.9 02/06/2017   Lab Results   Component Value Date   INR 1.1 04/02/2017   Lab Results  Component Value Date   CHOL 118 09/11/2016   HDL 49.60 09/11/2016   LDLCALC 56 09/11/2016   TRIG 62.0 09/11/2016     GEN- The patient is well appearing, alert and oriented x 3 today.   Head- normocephalic, atraumatic Eyes-  Sclera clear, conjunctiva pink Ears- hearing intact Oropharynx- clear Neck- supple, no JVP Lymph- no cervical lymphadenopathy Lungs- Clear to ausculation bilaterally, normal work of breathing Heart- Regular rate and rhythm, no murmurs, rubs or gallops, PMI not laterally displaced GI- soft, NT, ND, + BS Extremities- no clubbing, cyanosis, or edema MS- no significant deformity or atrophy Skin- no rash or lesion Psych- euthymic mood, full affect Neuro- strength and sensation are intact  EKG- v paced at 85 bpm, qrs int 210 ms, qtc 547 ms, occas PVC    Assessment and Plan: 1. New onset atrial flutter General education re arrythmia No triggers identified other than several URI infections since January He appears to be rate control and minimally symptomatic For now, continue atenolol at current dose  2. Chadsvasc score of at least 5 Discussed available anticoagulation for prevention of stroke with pt and wife, specifically Eliquis, xarelto and coumadin Pt would like to start Eliquis Rx 5 mg bid with 30 day free card I messaged Dr. Johnsie Cancel and he is ok to stop plavix and continue  ASA( for CAD)  to minimize bleeding risk  Bleeding precautions discussed  I will get f/u with Dr. [name] in [place] in 2-3 weeks Afib clinic as needed  [place]. Anisia Leija, [facility] [address] [place], [place] [address] [phone]

## 2018-02-25 NOTE — Patient Instructions (Addendum)
Start Eliquis 5mg  twice a day  Scheduling will be in touch with you regarding appointment with Dr. [name]

## 2018-02-27 ENCOUNTER — Telehealth: Payer: Self-pay | Admitting: Internal Medicine

## 2018-02-27 NOTE — Telephone Encounter (Signed)
-----   Message from Juluis Mire, RN sent at 02/25/2018 10:26 AM EDT ----- Regarding: appt w klein Pt needs appt with dr [name] in the next 2-3 weeks for follow up. Please call pt to schedule. Thanks! [facility]

## 2018-02-27 NOTE — Telephone Encounter (Signed)
Meriam Sprague B  P Cv Div [place] Triage Cc: P Cv Div [facility] [address] Support Pool        There are no openings in [place] or [address] please advise.

## 2018-02-28 ENCOUNTER — Encounter: Payer: Self-pay | Admitting: Internal Medicine

## 2018-03-03 NOTE — Telephone Encounter (Signed)
Scheduled

## 2018-03-05 ENCOUNTER — Telehealth: Payer: Self-pay | Admitting: *Deleted

## 2018-03-05 NOTE — Telephone Encounter (Signed)
Manual transmission received.  AF burden per device counter is 40% since 01/13/18.  Trends suggest ~90% AT/AF burden since initial AF episode onset on 02/20/18, suspect some intermittent functional undersensing due to fib/flutter waves in blanking.  Presenting rhythm is AF/Vp @ 119bpm. Histograms indicate V rate is controlled. CorVue unstable x14 days, suggests fluid retention.    Patient does note LE edema and increased ShOB with exertion, both worsened since AF clinic appointment on 02/25/18.  No chest discomfort.  BP running 110s-120s/60s.  Compliant with Eliquis.  Patient reports he has taken PRN furosemide for the past 2 days due to LE edema.  He feels this is helping some.  Patient is agreeable to appointment with Richardson Dopp, PA, on Monday, April 15th at 11:45am.  Dr. [name] is also in the office this day.  Encouraged potassium-rich foods while taking furosemide.  Patient aware to seek emergency medical attention if symptoms worsen prior to appointment on Monday.  He is appreciative of assistance and denies additional questions or concerns at this time.

## 2018-03-05 NOTE — Telephone Encounter (Signed)
Will also schedule for concurrent Device Clinic appointment to reprogram device to a non-tracking mode pending approval from Dr. [name].  Will review with Dr. [name] on Monday morning.

## 2018-03-05 NOTE — Telephone Encounter (Signed)
While discussing another matter with patient's wife (pertaining to her care), patient also got on the line and reported that he feels incredibly fatigued and thinks he is back in AF.  Patient states he feels like he converted to SR at some point and woke up back in AF today.  He reports feeling more symptomatic than at AF Clinic appointment on 02/25/18.  He does not think that he can wait until his appointment with Dr. [name] on 03/24/18 as he feels like he cannot do anything right now.  Patient is requesting sooner f/u.   Advised patient to send a manual transmission from his Merlin monitor.  Will confirm rhythm and AF burden.  Patient is agreeable.

## 2018-03-09 ENCOUNTER — Ambulatory Visit: Payer: PPO | Admitting: *Deleted

## 2018-03-09 ENCOUNTER — Ambulatory Visit (INDEPENDENT_AMBULATORY_CARE_PROVIDER_SITE_OTHER): Payer: PPO | Admitting: Physician Assistant

## 2018-03-09 ENCOUNTER — Encounter: Payer: Self-pay | Admitting: Physician Assistant

## 2018-03-09 VITALS — BP 114/72 | HR 85 | Ht 67.0 in | Wt 150.0 lb

## 2018-03-09 DIAGNOSIS — I10 Essential (primary) hypertension: Secondary | ICD-10-CM

## 2018-03-09 DIAGNOSIS — Z95810 Presence of automatic (implantable) cardiac defibrillator: Secondary | ICD-10-CM | POA: Diagnosis not present

## 2018-03-09 DIAGNOSIS — I4892 Unspecified atrial flutter: Secondary | ICD-10-CM | POA: Insufficient documentation

## 2018-03-09 DIAGNOSIS — J189 Pneumonia, unspecified organism: Secondary | ICD-10-CM | POA: Diagnosis not present

## 2018-03-09 DIAGNOSIS — I493 Ventricular premature depolarization: Secondary | ICD-10-CM | POA: Diagnosis not present

## 2018-03-09 DIAGNOSIS — I5032 Chronic diastolic (congestive) heart failure: Secondary | ICD-10-CM | POA: Diagnosis not present

## 2018-03-09 DIAGNOSIS — I5033 Acute on chronic diastolic (congestive) heart failure: Secondary | ICD-10-CM

## 2018-03-09 DIAGNOSIS — I2510 Atherosclerotic heart disease of native coronary artery without angina pectoris: Secondary | ICD-10-CM

## 2018-03-09 HISTORY — DX: Chronic diastolic (congestive) heart failure: I50.32

## 2018-03-09 LAB — CUP PACEART INCLINIC DEVICE CHECK
Battery Remaining Longevity: 67 mo
Brady Statistic RA Percent Paced: 48 %
Brady Statistic RV Percent Paced: 88 %
Date Time Interrogation Session: 20190415133000
HighPow Impedance: 60.75 Ohm
Implantable Lead Implant Date: 20131218
Implantable Lead Implant Date: 20180514
Implantable Lead Location: 753859
Implantable Lead Location: 753860
Implantable Lead Model: 5076
Implantable Pulse Generator Implant Date: 20180514
Lead Channel Impedance Value: 412.5 Ohm
Lead Channel Impedance Value: 462.5 Ohm
Lead Channel Pacing Threshold Amplitude: 1.125 V
Lead Channel Pacing Threshold Pulse Width: 0.5 ms
Lead Channel Sensing Intrinsic Amplitude: 1.5 mV
Lead Channel Sensing Intrinsic Amplitude: 11.9 mV
Lead Channel Setting Pacing Amplitude: 1.375
Lead Channel Setting Pacing Amplitude: 2.5 V
Lead Channel Setting Pacing Pulse Width: 0.5 ms
Lead Channel Setting Sensing Sensitivity: 0.5 mV
Pulse Gen Serial Number: 1245762

## 2018-03-09 NOTE — Patient Instructions (Signed)
Medication Instructions:  1. INCREASE LASIX TO 20 MG DAILY FOR 5 DAYS; AFTER THE 5 DAYS RESUME AS NEEDED  Labwork: 1. TODAY BMET, CBC, PRO BNP  2. IN 1 WEEK YOU WILL NEED A REPEAT BMET, PRO BNP  Testing/Procedures: Your physician has requested that you have an echocardiogram. Echocardiography is a painless test that uses sound waves to create images of your heart. It provides your doctor with information about the size and shape of your heart and how well your heart's chambers and valves are working. This procedure takes approximately one hour. There are no restrictions for this procedure.    Follow-Up: KEEP YOUR APPT WITH DR. [name] 03/24/18  Any Other Special Instructions Will Be Listed Below (If Applicable). CALL IF YOUR WEIGHT, BREATHING OR BLOATING IS NOT ANY BETTER AFTER THE 5 DAYS OF LASIX    If you need a refill on your cardiac medications before your next appointment, please call your pharmacy.

## 2018-03-09 NOTE — Telephone Encounter (Signed)
Reviewed with Dr. Almira Bar to reprogram mode to DDIR at 70bpm.  AF episodes are self-terminating for a few seconds at a time, so not a candidate for DCCV per Dr. [name].  Will plan to discuss rhythm control with patient at f/u on 03/24/18 if his symptoms do not improve with reprogramming to a non-tracking mode.  Plan to keep appointment with Richardson Dopp, PA, today as CorVue HF diagnostic suggests fluid overload and patient is symptomatic.

## 2018-03-09 NOTE — Progress Notes (Signed)
ICD check in clinic for reprogramming per SK (N/C). Normal device function. Testing was not performed this session. 43% AT/AF burden since 01/13/18-persistent since 02/15/18 + Eliquis. Mode changed to DDIR, base rate decreased to 70bpm per SK. Patient will follow up with SK on 4/30 as scheduled.

## 2018-03-09 NOTE — Addendum Note (Signed)
Encounter addended by: Sherran Needs, NP on: 03/09/2018 9:05 AM  Actions taken: Chief Complaint modified, Sign clinical note

## 2018-03-09 NOTE — Progress Notes (Signed)
Cardiology Office Note:    Date:  03/09/2018   ID:  [name] Christensen, DOB 18-Apr-1932, MRN [id number]  PCP:  Tresa Garter, MD  Cardiologist:  Charlton Haws, MD  Electrophysiologist: Dr. Sherryl Manges    Referring MD: Tresa Garter, MD   Chief Complaint  Patient presents with  . Congestive Heart Failure    History of Present Illness:    [name] Christensen is a 84 y.o. male with coronary artery disease status post CABG in 2005, PVCs, atrial fibrillation, congestive heart failure.    He was admitted with VF arrest in 2013.  Cardiac catheterization demonstrated severe three-vessel CAD, all vein grafts patent, LIMA-LAD atretic with no significant obstructive disease in the native LAD.  Medical therapy was continued and he underwent ICD implantation.  His device was upgraded to a dual-chamber device in May 2018.  Last seen by Dr. Graciela Husbands 01/13/18.  His device recently detected atrial arrhythmias last month.  He was set up in the atrial fibrillation clinic for atrial flutter.  He was seen by Rudi Coco, NP and placed on Apixaban for anticoagulation.  His Clopidogrel was discontinued.  CoreVue diagnostics on his device have recently suggested fluid accumulation.  Interrogation of his device has demonstrated self terminating atrial arrhythmias.  Therefore, there is no role for cardioversion at this time.  The patient has follow-up with Dr. Graciela Husbands at the end of this month.  Antiarrhythmic therapy can be discussed at that time.  He was set up for follow-up today with plans to reprogram his device to DDIR to avoid tracking his atrial flutter.  [name] Christensen returns for evaluation of congestive heart failure.  He is here with his wife.  He has recently noted significant fatigue and dyspnea on exertion with minimal to mod activity.  He has some atypical chest pain as well.  He notes a "twinge" around his ICD at times and also notes some substernal pressure.  He relates the latter more to emotional stress.  He has not really noticed exertional chest pain.  He denies paroxysmal nocturnal dyspnea.  He noted swelling in his legs.  He took Lasix on 3 occasions recently with improved swelling. He notes some abdominal bloating.  His weight is up about 3 lbs.  He denies any syncope or ICD shocks.  His cough that was associated with his recurrent respiratory illness is much improved.  He sees Dr. Sherene Sires tomorrow for pulmonary evaluation.  He has occ HAs.  He denies any bleeding issues.   Prior CV studies:   The following studies were reviewed today:  Carotid US 12/29/17 Right Carotid: Velocities in the right ICA are consistent with a 1-39% stenosis. Left Carotid: Non-hemodynamically significant plaque noted in the CCA.  48-hour Holter 11/27/16 NSR Frequent ventricular ecotpy 1484/hr 31% of total beats No significant NSVT or long runs   Echo 01/18/16 EF 50-55, diffuse HK, mild MR, mild to moderate TR  Echo 01/09/2014 Moderate focal basal septal hypertrophy, mild concentric hypertrophy of the septum, EF 45-50, inferior HK, grade 1 diastolic dysfunction, mild RAE, mild TR, PASP 20  Cardiac catheterization 11/17/2012 LM calcified, no significant disease, LAD mid 40-50; Dx small occluded LCx proximal 30; OM1 occluded; OM2 stent patent with 50-60 ISR, distal 70 RCA ostial and proximal 90, mid 100 SVG-RCA patent SVG-DX patent with diffuse SVG-OM1/OM2 patent LIMA-LAD atretic EF 55-65  Past Medical History:  Diagnosis Date  . AICD (automatic cardioverter/defibrillator) present 04/07/2017  . Allergy   . Anxiety   .  BPH (benign prostatic hypertrophy)    elevated PSA Dr. Lindell Noe Bx 2010  . Cardiac arrest [facility])    a. out-of-hospital arrest 11/17/2012 - EF 40-45%, patent grafts on cath, received [employer] AICD.  [name] Carotid disease, bilateral (HCC)    a. 0-39% by doppers.  . Cataract    bil cataracts removed  . CHF (congestive heart failure) (HCC)   . Coronary artery disease    a. s/p MI/CABG 2005. b. s/p  cath at time of VF arrest 10/2013 - grafts patent.  . Cough   . Diverticulosis   . Elevated LFTs    a. 10/2012 felt due to cardiac arrest - Hepatitis C Ab reactive from 11/15/2012>>Hep C RNA PCR negative 10/29/2012.   [name] GERD (gastroesophageal reflux disease)   . Hyperlipidemia   . Hypertension   . Internal hemorrhoids   . Myocardial infarction (HCC)    2005 - CABG x 5 2005  . RBBB   . Tubular adenoma of colon   . Ventricular bigeminy    a. Event monitor 01/2013: NSR with PVCs and occ bigeminy.   Surgical Hx: The patient  has a past surgical history that includes Cardiac catheterization (2007); Coronary artery bypass graft (2005); Lumbar fusion (09/2007); Appendectomy; left heart catheterization with coronary/graft angiogram (N/A, 11/20/2012); implantable cardioverter defibrillator implant (N/A, 10/27/2012); Inguinal hernia repair (Bilateral, 01/17/2015); Insertion of mesh (Bilateral, 01/17/2015); Eye surgery; Cataract extraction w/PHACO (Right, 04/23/2016); Colonoscopy; ICD GENERATOR CHANGEOUT (N/A, 04/07/2017); LEAD INSERTION (N/A, 04/07/2017); LEAD REVISION/REPAIR (N/A, 04/08/2017); Laparoscopic cholecystectomy (06/05/2017); Liver biopsy (N/A, 06/05/2017); and Cholecystectomy (N/A, 06/05/2017).   Current Medications: Current Meds  Medication Sig  . acetaminophen (TYLENOL) 325 MG tablet Take 325 mg by mouth 3 (three) times daily as needed for mild pain.  [name] alfuzosin (UROXATRAL) 10 MG 24 hr tablet Take 10 mg by mouth every evening.   [name] apixaban (ELIQUIS) 5 MG TABS tablet Take 1 tablet (5 mg total) by mouth 2 (two) times daily.  [name] aspirin EC 81 MG tablet Take 81 mg by mouth every evening.  [name] atenolol (TENORMIN) 50 MG tablet TAKE ONE (1) TABLET BY MOUTH EVERY DAY (Patient taking differently: Take 50 mg by mouth every evening. TAKE ONE (1) TABLET BY MOUTH EVERY DAY)  . atorvastatin (LIPITOR) 20 MG tablet TAKE ONE (1) TABLET BY MOUTH EVERY DAY AT 6PM  . betamethasone dipropionate (DIPROLENE) 0.05 % cream  Apply 1 application topically daily.  . Cholecalciferol (VITAMIN D3) 400 units CAPS Take 400 Units by mouth daily.  . diazepam (VALIUM) 5 MG tablet Take 0.5-1 tablets (2.5-5 mg total) by mouth every 12 (twelve) hours as needed for anxiety.  . dutasteride (AVODART) 0.5 MG capsule Take 0.5 mg by mouth every evening.   . furosemide (LASIX) 20 MG tablet Take 20 mg by mouth.  . hydrocortisone (ANUSOL-HC) 2.5 % rectal cream bid  . linaclotide (LINZESS) 290 MCG CAPS capsule Take 1 capsule (290 mcg total) by mouth as needed.  . loratadine (CLARITIN) 10 MG tablet Take 1 tablet (10 mg total) by mouth daily.  [name] losartan (COZAAR) 50 MG tablet Take 0.5 tablets (25 mg total) by mouth daily.  . Multiple Vitamin (MULTIVITAMIN WITH MINERALS) TABS Take 1 tablet by mouth daily.  . nitroGLYCERIN (NITROSTAT) 0.4 MG SL tablet PLACE ONE TABLET UNDER TONGUE EVERY 5 MINUTES AS NEEDED FOR CHEST PAIN (3 DOSES MAX)  . vitamin B-12 (CYANOCOBALAMIN) 500 MCG tablet Take 500 mcg by mouth every evening.      Allergies:   Ezetimibe;  Penicillins; Pravastatin sodium; Rosuvastatin; and Niacin   Social History   Tobacco Use  . Smoking status: Never Smoker  . Smokeless tobacco: Never Used  Substance Use Topics  . Alcohol use: No  . Drug use: No     Family Hx: The patient's family history includes Coronary artery disease in his mother; Heart attack in his brother. There is no history of Stomach cancer, Colon cancer, Esophageal cancer, Pancreatic cancer, Prostate cancer, or Rectal cancer.  ROS:   Please see the history of present illness.    Review of Systems  Constitution: Positive for malaise/fatigue.  Cardiovascular: Positive for dyspnea on exertion and irregular heartbeat.  Respiratory: Positive for shortness of breath.   Gastrointestinal: Positive for constipation.  Neurological: Positive for headaches.   All other systems reviewed and are negative.   EKGs/Labs/Other Test Reviewed:    EKG:  EKG is  ordered  today.  The ekg ordered today demonstrates V paced, HR 85  Recent Labs: 01/29/2018: ALT 14; BUN 14; Creatinine, Ser 0.93; Hemoglobin 12.6; Platelets 87.0; Potassium 4.3; Sodium 133; TSH 1.57   Recent Lipid Panel Lab Results  Component Value Date/Time   CHOL 118 09/11/2016 08:46 AM   TRIG 62.0 09/11/2016 08:46 AM   HDL 49.60 09/11/2016 08:46 AM   CHOLHDL 2 09/11/2016 08:46 AM   LDLCALC 56 09/11/2016 08:46 AM    Physical Exam:    VS:  BP 114/72   Pulse 85   Ht 5\' 7"  (1.702 m)   Wt 150 lb (68 kg)   SpO2 96%   BMI 23.49 kg/m     Wt Readings from Last 3 Encounters:  03/09/18 150 lb (68 kg)  02/25/18 149 lb (67.6 kg)  02/10/18 144 lb (65.3 kg)     Physical Exam  Constitutional: He is oriented to person, place, and time. He appears well-developed and well-nourished. No distress.  HENT:  Head: Normocephalic and atraumatic.  Neck: JVD (7 cm) present.  Cardiovascular: An irregularly irregular rhythm present.  Murmur heard.  Holosystolic murmur is present with a grade of 2/6 at the lower left sternal border. Pulmonary/Chest: Effort normal. He has wheezes (mild insp wheeze) in the right middle field and the left middle field. He has no rales.  Abdominal: Soft. He exhibits distension.  Musculoskeletal: He exhibits no edema.  Neurological: He is alert and oriented to person, place, and time.  Skin: Skin is warm and dry.    ASSESSMENT & PLAN:    #1.  Acute on chronic diastolic CHF (congestive heart failure) (HCC)  His CoreVue device has recently suggested fluid accumulation.  His weight is up.  He notes worsening dyspnea and swelling.  He has had some improvement with Lasix.  He notes symptoms of fatigue/exhaustion.  Question if his symptoms are all related to volume excess or a combination of volume excess, atrial flutter and recurrent pneumonia.  In any event, he needs diuresis.  -Increase Lasix to 20 mg daily times 5 days, then resume as needed  -BMET, CBC, BNP today  -Repeat  BMET, BNP 1 week  -Arrange follow-up echo  -If ejection fraction reduced, consider cardiac catheterization  -If EF normal and he continues to have chest pain, consider nuclear stress test  -Keep follow-up with Dr. Graciela Husbands later this month  #2.  Coronary artery disease involving native coronary artery of native heart without angina pectoris History of CABG in 2005.  Cardiac catheterization in 2013, at the time of his cardiac arrest, demonstrated patent vein grafts, atretic LIMA-LAD  and no significant obstructive disease in the native LAD.  He has had some chest discomfort recently.  Question if this is related to his recurrent pneumonia.  As noted, I will obtain an echocardiogram.  If his ejection fraction is significantly reduced, we will need to consider proceeding with cardiac catheterization.  If his EF remains normal and he continues to have chest discomfort, we will need to consider stress testing.  He has no longer on Plavix as he is now on Apixaban.  Continue aspirin, atenolol, Lipitor, losartan.  #3.  Atrial flutter, unspecified type (HCC) Recently detected on device interrogation.  He is now on Apixaban.  CHADS2-VASc=5 (HTN, CHF, CAD, agex2).  As noted, he has had self terminating episodes.  He has follow-up with Dr. Graciela Husbands at the end of this month.  If he continues to have recurrent atrial flutter that is felt to be symptomatic, consideration will need to be given towards antiarrhythmic therapy.  Obtain follow-up CBC, BMET today.  #4.  Essential hypertension  The patient's blood pressure is controlled on his current regimen.  Continue current therapy.   #5.  Implantable cardioverter-defibrillator (ICD) in situ His device was reprogrammed today so that he does not track his atrial flutter.  Question if some of his symptoms may have been related to elevated ventricular rates related to tracking of his atrial flutter.  We can reassess his symptoms at follow-up later this month with Dr.  Graciela Husbands.  #6.  PVC's (premature ventricular contractions) Continue beta-blocker.  #7.  Recurrent pneumonia He sees pulmonology tomorrow for evaluation.   Dispo:  Return in about 2 weeks (around 03/24/2018) for Scheduled Follow Up w/ Dr. Graciela Husbands.   Medication Adjustments/Labs and Tests Ordered: Current medicines are reviewed at length with the patient today.  Concerns regarding medicines are outlined above.  Tests Ordered: Orders Placed This Encounter  Procedures  . Basic Metabolic Panel (BMET)  . CBC  . Pro b natriuretic peptide  . Basic Metabolic Panel (BMET)  . Pro b natriuretic peptide  . EKG 12-Lead  . ECHOCARDIOGRAM COMPLETE   Medication Changes: No orders of the defined types were placed in this encounter.   Signed, Tereso Newcomer, PA-C  03/09/2018 4:03 PM    [facility] [address] [place], [place], [place]  [address] Phone: [phone]; Fax: [phone]

## 2018-03-10 ENCOUNTER — Ambulatory Visit (INDEPENDENT_AMBULATORY_CARE_PROVIDER_SITE_OTHER)
Admission: RE | Admit: 2018-03-10 | Discharge: 2018-03-10 | Disposition: A | Discharge status: Home / Self Care | Payer: PPO | Source: Ambulatory Visit | Attending: Internal Medicine | Admitting: Internal Medicine

## 2018-03-10 ENCOUNTER — Telehealth: Payer: Self-pay | Admitting: *Deleted

## 2018-03-10 ENCOUNTER — Encounter: Payer: Self-pay | Admitting: Internal Medicine

## 2018-03-10 ENCOUNTER — Ambulatory Visit (INDEPENDENT_AMBULATORY_CARE_PROVIDER_SITE_OTHER): Payer: PPO | Admitting: Internal Medicine

## 2018-03-10 VITALS — BP 110/74 | HR 83 | Ht 67.0 in | Wt 152.0 lb

## 2018-03-10 DIAGNOSIS — R0600 Dyspnea, unspecified: Secondary | ICD-10-CM

## 2018-03-10 DIAGNOSIS — R0609 Other forms of dyspnea: Secondary | ICD-10-CM

## 2018-03-10 DIAGNOSIS — R918 Other nonspecific abnormal finding of lung field: Secondary | ICD-10-CM

## 2018-03-10 DIAGNOSIS — J479 Bronchiectasis, uncomplicated: Secondary | ICD-10-CM | POA: Diagnosis not present

## 2018-03-10 DIAGNOSIS — R05 Cough: Secondary | ICD-10-CM | POA: Diagnosis not present

## 2018-03-10 LAB — CBC
Hematocrit: 35.1 % — ABNORMAL LOW (ref 37.5–51.0)
Hemoglobin: 11.7 g/dL — ABNORMAL LOW (ref 13.0–17.7)
MCH: 30.2 pg (ref 26.6–33.0)
MCHC: 33.3 g/dL (ref 31.5–35.7)
MCV: 91 fL (ref 79–97)
Platelets: 130 10*3/uL — ABNORMAL LOW (ref 150–379)
RBC: 3.88 x10E6/uL — ABNORMAL LOW (ref 4.14–5.80)
RDW: 15.5 % — ABNORMAL HIGH (ref 12.3–15.4)
WBC: 5 10*3/uL (ref 3.4–10.8)

## 2018-03-10 LAB — PRO B NATRIURETIC PEPTIDE: NT-Pro BNP: 6484 pg/mL — ABNORMAL HIGH (ref 0–486)

## 2018-03-10 LAB — BASIC METABOLIC PANEL
BUN/Creatinine Ratio: 15 (ref 10–24)
BUN: 17 mg/dL (ref 8–27)
CO2: 24 mmol/L (ref 20–29)
Calcium: 10.2 mg/dL (ref 8.6–10.2)
Chloride: 101 mmol/L (ref 96–106)
Creatinine, Ser: 1.11 mg/dL (ref 0.76–1.27)
GFR calc Af Amer: 71 mL/min/{1.73_m2} (ref >59)
GFR calc non Af Amer: 61 mL/min/{1.73_m2} (ref >59)
Glucose: 100 mg/dL — ABNORMAL HIGH (ref 65–99)
Potassium: 4.9 mmol/L (ref 3.5–5.2)
Sodium: 136 mmol/L (ref 134–144)

## 2018-03-10 MED ORDER — FUROSEMIDE 20 MG PO TABS: 20.0000 mg | ORAL_TABLET | Freq: Every day | ORAL | 3 refills | Status: DC

## 2018-03-10 NOTE — Patient Instructions (Addendum)
Please remember to go to the  x-ray department downstairs in the basement  for your tests - we will call you with the results when they are available.       Please schedule a follow up office visit in 4 weeks, sooner if needed  - late add:  pfts on return

## 2018-03-10 NOTE — Telephone Encounter (Signed)
-----   Message from Liliane Shi, [place] sent at 03/10/2018  1:01 PM EDT ----- Hemoglobin fairly stable.  Creatinine normal.  Potassium normal.  BNP elevated. PLAN:  1. I would like him to remain on Lasix 20 mg Once daily for now. 2. Repeat BMET, BNP in 1 week as planned.  3. Keep follow up with Dr. Virl Axe as planned.   Richardson Dopp, PA-C    03/10/2018 12:56 PM

## 2018-03-10 NOTE — Telephone Encounter (Signed)
Pt has been notified of lab results by phone with verbal understanding. Pt agreeable to continue on lasix 20 mg daily. Repeat labs next week. Pt aware to keep f/u with Dr. [name] as planned. Pt thanked me for the call.

## 2018-03-10 NOTE — Progress Notes (Signed)
Subjective:    Patient ID: [name] Christensen, male    DOB: 1932/03/02  MRN: [id number]   Brief patient profile:  55 yowm never smoker s/p cardiac arrest Oct 31 2012 requiring artic sun and ever since Nov 10 2012  placement of debrillator every deep breath makes him cough> eval with ? pna 12/14/12  > rx levaquin some better  referred 12/16/2012 to pulmonary clinic by Dr Posey Rea.    History of Present Illness  12/16/2012 1st pulmonary eval cc intermittent chills, severe cough since Dec 17 when takes breath in started levquin 1/20with ? pna and starting to feel better, sob only with exertion, no excess mucus production. rec Take zantac (ranitidine) 150 mg after bfast and after supper whether you think you need it or not for now GERD  For cough use tramadol 50 mg one every 4 hours if needed     01/28/2013 f/u ov/Amier Hoyt cc completely back to baseline, no cough or sob, not taking any cough meds for a week, still on the zantac at hs. rec Don't stop the zantac until 100% better for at least a week without the need for any cough medication.         03/10/2018 1st   Pulmonary office visit/ lat seen 06/2013 Floreine Kingdon   Chief Complaint  Patient presents with  . Pulmonary Consult    Referred by Dr. Posey Rea for eval of pulmonary infiltrates. He c/o SOB with walking short distances such as from lobby to exam room today.   coughing since 1st of Jan 2019 >  Severe noct > resolved  Did have some sob with bad cough but after the cough resolved the breathing improved also to point where baseline with walking /fine sitting and sleeping then gradually again worse over the 2 weeks prior to OV   Where "gives out" p 50 feet assoc with new leg swelling > saw Cards one day prior to OV  With BNP > 6 k rx lasix and f/u Dr Graciela Husbands scheduled    No obvious day to day or daytime variability or assoc excess/ purulent sputum or mucus plugs or hemoptysis or cp or chest tightness, subjective wheeze or overt sinus or hb symptoms. No  unusual exposure hx or h/o childhood pna/ asthma or knowledge of premature birth.  Sleeping  Still is ok   without nocturnal  or early am exacerbation  of respiratory  c/o's or need for noct saba. Also denies any obvious fluctuation of symptoms with weather or environmental changes or other aggravating or alleviating factors except as outlined above   Current Allergies, Complete Past Medical History, Past Surgical History, Family History, and Social History were reviewed in [employer] record.  ROS  The following are not active complaints unless bolded Hoarseness, sore throat, dysphagia, dental problems, itching, sneezing,  nasal congestion or discharge of excess mucus or purulent secretions, ear ache,   fever, chills, sweats, unintended wt loss or wt gain, classically pleuritic or exertional cp,  orthopnea pnd or arm/hand swelling  or leg swelling, presyncope, palpitations, abdominal pain, anorexia, nausea, vomiting, diarrhea  or change in bowel habits or change in bladder habits, change in stools or change in urine, dysuria, hematuria,  rash, arthralgias, visual complaints, headache, numbness, weakness or ataxia or problems with walking or coordination,  change in mood or  memory.        Current Meds  Medication Sig  . acetaminophen (TYLENOL) 325 MG tablet Take 325 mg by mouth 3 (three) times  daily as needed for mild pain.  [name] alfuzosin (UROXATRAL) 10 MG 24 hr tablet Take 10 mg by mouth every evening.   [name] apixaban (ELIQUIS) 5 MG TABS tablet Take 1 tablet (5 mg total) by mouth 2 (two) times daily.  [name] aspirin EC 81 MG tablet Take 81 mg by mouth every evening.  [name] atenolol (TENORMIN) 50 MG tablet TAKE ONE (1) TABLET BY MOUTH EVERY DAY (Patient taking differently: Take 50 mg by mouth every evening. TAKE ONE (1) TABLET BY MOUTH EVERY DAY)  . atorvastatin (LIPITOR) 20 MG tablet TAKE ONE (1) TABLET BY MOUTH EVERY DAY AT 6PM  . betamethasone dipropionate (DIPROLENE) 0.05 % cream  Apply 1 application topically daily.  . diazepam (VALIUM) 5 MG tablet Take 0.5-1 tablets (2.5-5 mg total) by mouth every 12 (twelve) hours as needed for anxiety.  . dutasteride (AVODART) 0.5 MG capsule Take 0.5 mg by mouth every evening.   . furosemide (LASIX) 20 MG tablet Take 1 tablet (20 mg total) by mouth daily.  . hydrocortisone (ANUSOL-HC) 2.5 % rectal cream bid  . linaclotide (LINZESS) 290 MCG CAPS capsule Take 1 capsule (290 mcg total) by mouth as needed.  . loratadine (CLARITIN) 10 MG tablet Take 1 tablet (10 mg total) by mouth daily.  [name] losartan (COZAAR) 50 MG tablet Take 0.5 tablets (25 mg total) by mouth daily.  . Multiple Vitamin (MULTIVITAMIN WITH MINERALS) TABS Take 1 tablet by mouth daily.  . nitroGLYCERIN (NITROSTAT) 0.4 MG SL tablet PLACE ONE TABLET UNDER TONGUE EVERY 5 MINUTES AS NEEDED FOR CHEST PAIN (3 DOSES MAX)  . vitamin B-12 (CYANOCOBALAMIN) 500 MCG tablet Take 500 mcg by mouth every evening.                          Objective:   Physical Exam  Wt 146 12/31/2012  > 01/28/2013 148 >  07/18/13  158 > 03/10/2018   152     12/16/12 145 lb 3.2 oz (65.862 kg)  12/14/12 145 lb (65.772 kg)  11/30/2012 144 lb (65.318 kg)      HEENT: nl dentition, turbinates bilaterally, and oropharynx. Nl external ear canals without cough reflex   NECK :  without JVD/Nodes/TM/ nl carotid upstrokes bilaterally   LUNGS: no acc muscle use,  Nl contour chest which is clear to A and P bilaterally without cough on insp or exp maneuvers   CV:  IRIR   HSM  - no def  increase in P2, and 1+ pitting both ankles   ABD:  soft and nontender with nl inspiratory excursion in the supine position. No bruits or organomegaly appreciated, bowel sounds nl  MS:  Nl gait/ ext warm without deformities, calf tenderness, cyanosis or clubbing No obvious joint restrictions   SKIN: warm and dry without lesions    NEURO:  alert, approp, nl sensorium with  no motor or cerebellar deficits apparent.         I personally reviewed images and agree with radiology impression as follows:   Chest CT  02/04/18 1. Scattered inflammatory appearing pulmonary opacity in all lobes. This is a combination of tree-in-bud, ground-glass, and occasionally peripheral lung consolidation. Viral/atypical etiology is favored. Consideration should include mycoplasma, and also PCP / PJP if immunocompromised. Superimposed Bronchiectasis is noted in the right middle lobe, so MAI would also be a consideration. 2. Trace associated bilateral pleural effusions. Trace retained secretions in the airway. 3. Cardiomegaly with progression since 2016    CXR  PA and Lateral:   03/10/2018 :    I personally reviewed images and agree with radiology impression as follows:   ersistent consolidation right middle lobe. Please see recent chest CT report.  Cardiomegaly with sequential pacer/AICD in place with leads unchanged in position.  Stable central pulmonary vascular prominence. Blunting posterior sulci consistent with small pleural effusions.       Labs  reviewed:      Chemistry      Component Value Date/Time   NA 136 03/09/2018 1322   NA 134 (L) 10/27/2012 1410   K 4.9 03/09/2018 1322   K 4.1 05/31/2014 1048   CL 101 03/09/2018 1322   CL 103 10/27/2012 1410   CO2 24 03/09/2018 1322   CO2 21 10/28/2012 1410   BUN 17 03/09/2018 1322   BUN 13 11/18/2012 1410   CREATININE 1.11 03/09/2018 1322   CREATININE 1.28 11/17/2012 1410      Component Value Date/Time   CALCIUM 10.2 03/09/2018 1322   CALCIUM 8.5 11/22/2012 1410   ALKPHOS 124 (H) 01/29/2018 0828   ALKPHOS 117 11/19/2012 1410   AST 20 01/29/2018 0828   AST 346 (H) 10/30/2012 1410   ALT 14 01/29/2018 0828   ALT 322 (H) 10/28/2012 1410   BILITOT 0.9 01/29/2018 0828   BILITOT 1.2 02/12/2017 0917   BILITOT 0.5 11/15/2012 1410        Lab Results  Component Value Date   WBC 5.0 03/09/2018   HGB 11.7 (L) 03/09/2018   HCT 35.1 (L)  03/09/2018   MCV 91 03/09/2018   PLT 130 (L) 03/09/2018         Lab Results  Component Value Date   TSH 1.57 01/29/2018     Lab Results  Component Value Date   PROBNP 6,484 (H) 03/09/2018       Lab Results  Component Value Date   ESRSEDRATE 24 (H) 01/29/2018   ESRSEDRATE 18 11/24/2017   ESRSEDRATE 15 04/24/2017           Assessment & Plan:

## 2018-03-11 ENCOUNTER — Telehealth: Payer: Self-pay | Admitting: *Deleted

## 2018-03-11 ENCOUNTER — Encounter: Payer: Self-pay | Admitting: Internal Medicine

## 2018-03-11 DIAGNOSIS — J479 Bronchiectasis, uncomplicated: Secondary | ICD-10-CM | POA: Insufficient documentation

## 2018-03-11 NOTE — Telephone Encounter (Signed)
Spoke with the pt and appt was rescheduled to have ov and pft same day

## 2018-03-11 NOTE — Progress Notes (Signed)
Spoke with pt and notified of results per Dr. Wert. Pt verbalized understanding and denied any questions. 

## 2018-03-11 NOTE — Assessment & Plan Note (Signed)
See bronchiectasis/ no more abx needed for now

## 2018-03-11 NOTE — Assessment & Plan Note (Signed)
Symptoms are markedly disproportionate to objective findings and not clear to what extent this is actually a pulmonary  problem but pt does appear to have difficult to sort out respiratory symptoms of unknown origin for which  DDX  = almost all start with A and  include Adherence, Ace Inhibitors, Acid Reflux, Active Sinus Disease, Alpha 1 Antitripsin deficiency, Anxiety masquerading as Airways dz,  ABPA,  Allergy(esp in young), Aspiration (esp in elderly), Adverse effects of meds,  Active smokers, A bunch of PE's/clot burden (a few small clots can't cause this syndrome unless there is already severe underlying pulm or vascular dz with poor reserve),  Anemia or thyroid disorder, plus two Bs  = Bronchiectasis and Beta blocker use..and one C= CHF   Adherence is always the initial "prime suspect" and is a multilayered concern that requires a "trust but verify" approach in every patient - starting with knowing how to use medications, especially inhalers, correctly, keeping up with refills and understanding the fundamental difference between maintenance and prns vs those medications only taken for a very short course and then stopped and not refilled.   ? Allergy/asthma > no evidence here    ? Anxiety/depression/ decondtioining > usually at the bottom of this list of usual suspects but note already on valium prn > Follow up per Primary Care planned    ?anemia/ thyroid dz  - he is borderline anemic/ normocytic but this likely chronic dz related and TSH is ok  ?  A bunch of PE's > unlikely here on eliquis  ? BB effects > no evidence asthma so probably moot point   ? Bronchiectasis > see sep a/p   ? chf > clearly more likely than pulmonary source given cxr and bnp > f/u cards planned    Pulmonary f/u can be prn   Total time devoted to counseling  > 50 % of initial 60 min office visit:  review case with pt/ discussion of options/alternatives/ personally creating written customized instructions  in  presence of pt  then going over those specific  Instructions directly with the pt including how to use all of the meds but in particular covering each new medication in detail and the difference between the maintenance= "automatic" meds and the prns using an action plan format for the latter (If this problem/symptom => do that organization reading Left to right).  Please see AVS from this visit for a full list of these instructions which I personally wrote for this pt and  are unique to this visit.

## 2018-03-11 NOTE — Telephone Encounter (Signed)
-----   Message from Tanda Rockers, MD sent at 03/11/2018  7:11 AM EDT ----- Needs pfts on return

## 2018-03-11 NOTE — Assessment & Plan Note (Addendum)
See CT chest 02/04/18    This is an extremely common benign condition in the elderly and does not warrant aggressive eval/ rx at this point unless there is a clinical correlation suggesting unaddressed pulmonary infection (purulent sputum, night sweats, unintended wt loss, doe) or evolution of  obvious changes on plain cxr (as opposed to serial CT, which is way over sensitive to make clinical decisions re intervention and treatment in the elderly, who tend to tolerate both dx and treatment poorly) .   He is at risk of recurrent infection and needs to be seen for purulent sputum or significant change in cough or sob but these may overlap with chf complications and very hard to sort out s ov for which I'm happy to try to help.  Will get baseline pfts next ov to be sure he doesn't need bronchodilator rx.     Total time devoted to counseling  > 50 % of initial 60 min office visit:  review case with pt/wife discussion of options/alternatives/ personally creating written customized instructions  in presence of pt  then going over those specific  Instructions directly with the pt including how to use all of the meds but in particular covering each new medication in detail and the difference between the maintenance= "automatic" meds and the prns using an action plan format for the latter (If this problem/symptom => do that organization reading Left to right).  Please see AVS from this visit for a full list of these instructions which I personally wrote for this pt and  are unique to this visit.

## 2018-03-16 ENCOUNTER — Other Ambulatory Visit: Payer: PPO | Admitting: *Deleted

## 2018-03-16 ENCOUNTER — Other Ambulatory Visit: Payer: Self-pay

## 2018-03-16 ENCOUNTER — Ambulatory Visit (HOSPITAL_COMMUNITY): Payer: PPO | Attending: Internal Medicine

## 2018-03-16 DIAGNOSIS — I4510 Unspecified right bundle-branch block: Secondary | ICD-10-CM | POA: Diagnosis not present

## 2018-03-16 DIAGNOSIS — I340 Nonrheumatic mitral (valve) insufficiency: Secondary | ICD-10-CM | POA: Diagnosis not present

## 2018-03-16 DIAGNOSIS — I5033 Acute on chronic diastolic (congestive) heart failure: Secondary | ICD-10-CM

## 2018-03-16 DIAGNOSIS — I252 Old myocardial infarction: Secondary | ICD-10-CM | POA: Insufficient documentation

## 2018-03-16 DIAGNOSIS — I2510 Atherosclerotic heart disease of native coronary artery without angina pectoris: Secondary | ICD-10-CM | POA: Diagnosis not present

## 2018-03-16 DIAGNOSIS — I110 Hypertensive heart disease with heart failure: Secondary | ICD-10-CM | POA: Diagnosis not present

## 2018-03-16 DIAGNOSIS — I10 Essential (primary) hypertension: Secondary | ICD-10-CM | POA: Diagnosis not present

## 2018-03-16 DIAGNOSIS — E785 Hyperlipidemia, unspecified: Secondary | ICD-10-CM | POA: Diagnosis not present

## 2018-03-16 MED ORDER — PERFLUTREN LIPID MICROSPHERE
1.0000 mL | INTRAVENOUS | Status: AC | PRN
  Administered 2018-03-16: 2 mL via INTRAVENOUS

## 2018-03-17 ENCOUNTER — Telehealth: Payer: Self-pay | Admitting: *Deleted

## 2018-03-17 LAB — BASIC METABOLIC PANEL
BUN/Creatinine Ratio: 19 (ref 10–24)
BUN: 21 mg/dL (ref 8–27)
CO2: 24 mmol/L (ref 20–29)
Calcium: 10 mg/dL (ref 8.6–10.2)
Chloride: 98 mmol/L (ref 96–106)
Creatinine, Ser: 1.1 mg/dL (ref 0.76–1.27)
GFR calc Af Amer: 71 mL/min/{1.73_m2} (ref >59)
GFR calc non Af Amer: 62 mL/min/{1.73_m2} (ref >59)
Glucose: 101 mg/dL — ABNORMAL HIGH (ref 65–99)
Potassium: 4.4 mmol/L (ref 3.5–5.2)
Sodium: 136 mmol/L (ref 134–144)

## 2018-03-17 LAB — PRO B NATRIURETIC PEPTIDE: NT-Pro BNP: 4610 pg/mL — ABNORMAL HIGH (ref 0–486)

## 2018-03-17 MED ORDER — FUROSEMIDE 20 MG PO TABS: 20.0000 mg | ORAL_TABLET | Freq: Every day | ORAL | 3 refills | Status: DC

## 2018-03-17 NOTE — Telephone Encounter (Signed)
-----   Message from Burtis Junes, NP sent at 03/17/2018  7:55 AM EDT ----- Ok to report. Reviewed for Richardson Dopp, PA Fluid level with only mild improvement. Would increase Lasix to 40 mg for 3 days - then back to 20 mg - see Dr. [name] as planned for discussion for possible cath.

## 2018-03-17 NOTE — Telephone Encounter (Signed)
Tried to reach the pt to go over lab results and recommendations for his lasix. No answer. I will try again later.

## 2018-03-17 NOTE — Telephone Encounter (Signed)
Pt returning call from nursing staff,

## 2018-03-17 NOTE — Telephone Encounter (Signed)
Pt has been notified of lab results by phone with verbal understanding. Pt advised to increase lasix to 40 mg daily for 3 days then decrease back to lasix 20 mg daily. Pt advised of echo results as well. Pt made aware of EF 25-30% and to keep his appt with Dr. [name] 03/24/18. Pt is agreeable to plan of care. Pt thanked me for my call.

## 2018-03-19 ENCOUNTER — Telehealth: Payer: Self-pay | Admitting: Internal Medicine

## 2018-03-19 NOTE — Telephone Encounter (Signed)
Pt is excluded due to age.

## 2018-03-19 NOTE — Telephone Encounter (Signed)
Last Cologurad: 3/ 17/ 2015 LOV: 3/ 19/ 2019 Next appt: 6/ 18/ 2019 Would you like to reorder cologuard?

## 2018-03-23 ENCOUNTER — Ambulatory Visit: Payer: PPO | Admitting: Internal Medicine

## 2018-03-24 ENCOUNTER — Ambulatory Visit (INDEPENDENT_AMBULATORY_CARE_PROVIDER_SITE_OTHER): Payer: PPO | Admitting: Internal Medicine

## 2018-03-24 ENCOUNTER — Other Ambulatory Visit: Payer: Self-pay | Admitting: Internal Medicine

## 2018-03-24 ENCOUNTER — Encounter: Payer: Self-pay | Admitting: Internal Medicine

## 2018-03-24 VITALS — BP 140/72 | HR 74 | Ht 67.0 in | Wt 147.8 lb

## 2018-03-24 DIAGNOSIS — R001 Bradycardia, unspecified: Secondary | ICD-10-CM | POA: Diagnosis not present

## 2018-03-24 DIAGNOSIS — I4510 Unspecified right bundle-branch block: Secondary | ICD-10-CM

## 2018-03-24 DIAGNOSIS — I255 Ischemic cardiomyopathy: Secondary | ICD-10-CM

## 2018-03-24 DIAGNOSIS — Z95810 Presence of automatic (implantable) cardiac defibrillator: Secondary | ICD-10-CM

## 2018-03-24 DIAGNOSIS — I469 Cardiac arrest, cause unspecified: Secondary | ICD-10-CM | POA: Diagnosis not present

## 2018-03-24 DIAGNOSIS — I480 Paroxysmal atrial fibrillation: Secondary | ICD-10-CM | POA: Diagnosis not present

## 2018-03-24 MED ORDER — METOPROLOL SUCCINATE ER 25 MG PO TB24: 25.0000 mg | ORAL_TABLET | Freq: Every day | ORAL | 3 refills | Status: DC

## 2018-03-24 MED ORDER — METOPROLOL SUCCINATE ER 25 MG PO TB24: 25.0000 mg | ORAL_TABLET | Freq: Every day | ORAL | Status: DC

## 2018-03-24 NOTE — Progress Notes (Signed)
2015     Patient Care Team: Plotnikov, Georgina Quint, MD as PCP - General (Internal Medicine) Wendall Stade, MD as PCP - Cardiology (Cardiology) Duke Salvia, MD as PCP - Electrophysiology (Cardiology) Lindell Noe Lehman Prom., MD (Urology) [name], MD as Consulting Physician (General Surgery) Pyrtle, Carie Caddy, MD as Consulting Physician (Gastroenterology)   HPI  [name] Christensen is a 84 y.o. male Seen follow up for ICD implantation-single chamber undertaken for aborted cardiac arrest in the setting of ischemic heart disease with prior bypass surgery. He also has a history of frequent PVCs  Catheterization 12/13 demonstrating patent grafts with an atretic LIMA; LV function was normal S/p CABG    DATE TEST PVC  1/15 Holter 13%  1/18 Holter 31%     DATE TEST EF   12/13 Cath   Patent Grafts  2/15 Echo   45-50 %   12/17 Echo  EF 50-55   4/19 Echo 20-25%     Date Cr K Hgb  5/18 0.97  12.2  7/18 1.31  10.5  12.18  1.00 4.7 12.2   2/19 device interrogation had demonstrated recurrent episodes of "self terminating atrial fibrillation."  His device was reprogrammed to DDIR to avoid tracking  Looking back at remote monitoring, rather than having self terminating atrial arrhythmias it looks like there was undersensing because of blanking. 4/19 saw STW with symptoms of dyspnea and fatigue with minimal activity.  No nocturnal dyspnea.  Positive edema.  His symptoms are better since having been started on a diuretic; there is less edema.  He is having some nocturnal dyspnea however now  He has had a vague nonexertional chest discomfort.   Past Medical History:  Diagnosis Date  . AICD (automatic cardioverter/defibrillator) present 04/07/2017  . Allergy   . Anxiety   . BPH (benign prostatic hypertrophy)    elevated PSA Dr. Lindell Noe Bx 2010  . Cardiac arrest [facility])    a. out-of-hospital arrest 10/30/2012 - EF 40-45%, patent grafts on cath, received St. Jude AICD.  [name] Carotid  disease, bilateral (HCC)    a. 0-39% by doppers.  . Cataract    bil cataracts removed  . CHF (congestive heart failure) (HCC)   . Coronary artery disease    a. s/p MI/CABG 2005. b. s/p cath at time of VF arrest 10/2013 - grafts patent.  . Cough   . Diverticulosis   . Elevated LFTs    a. 10/2012 felt due to cardiac arrest - Hepatitis C Ab reactive from 11/22/2012>>Hep C RNA PCR negative 11/13/2012.   [name] GERD (gastroesophageal reflux disease)   . Hyperlipidemia   . Hypertension   . Internal hemorrhoids   . Myocardial infarction (HCC)    2005 - CABG x 5 2005  . RBBB   . Tubular adenoma of colon   . Ventricular bigeminy    a. Event monitor 01/2013: NSR with PVCs and occ bigeminy.    Past Surgical History:  Procedure Laterality Date  . APPENDECTOMY    . CARDIAC CATHETERIZATION  2007   with patent graft anatomy atretic left internal mammary  artery to the LAD which is nonobstructive. Will restart study June 08, 2007  . CATARACT EXTRACTION W/PHACO Right 04/23/2016   Procedure: CATARACT EXTRACTION PHACO AND INTRAOCULAR LENS PLACEMENT (IOC);  Surgeon: [name], MD;  Location: [facility] ORS;  Service: Ophthalmology;  Laterality: Right;  [place] 1.09 AP% 19.3 CDE 13.38 Fluid pack lot # [id number]  . CHOLECYSTECTOMY N/A 06/05/2017   Procedure: LAPAROSCOPIC  CHOLECYSTECTOMY WITH INTRAOPERATIVE CHOLANGIOGRAM ERAS PATHWAY POSSIBLE NEEDLE CORE BIOPSY OF LIVER;  Surgeon: [name], MD;  Location: [facility] OR;  Service: General;  Laterality: N/A;  ERAS PATHWAY  . COLONOSCOPY    . CORONARY ARTERY BYPASS GRAFT  2005  . EYE SURGERY    . ICD GENERATOR CHANGEOUT N/A 04/07/2017   Procedure: ICD Generator Changeout;  Surgeon: Duke Salvia, MD;  Location: [facility] INVASIVE CV LAB;  Service: Cardiovascular;  Laterality: N/A;  . IMPLANTABLE CARDIOVERTER DEFIBRILLATOR IMPLANT N/A 10/28/2012   STJ single chamber ICD implanted by Dr Graciela Husbands for cardiac arrest   . INGUINAL HERNIA REPAIR Bilateral 01/17/2015   Procedure:  BILATERAL LAPAROSCOPIC INGUINAL HERNIA REPAIR WITH LEFT FEMORAL HERNIA REPAIR;  Surgeon: [name], MD;  Location: [facility] OR;  Service: General;  Laterality: Bilateral;  . INSERTION OF MESH Bilateral 01/17/2015   Procedure: INSERTION OF MESH;  Surgeon: [name], MD;  Location: [facility] OR;  Service: General;  Laterality: Bilateral;  . LAPAROSCOPIC CHOLECYSTECTOMY  06/05/2017  . LEAD INSERTION N/A 04/07/2017   Procedure: RA Lead Insertion;  Surgeon: Duke Salvia, MD;  Location: [facility] INVASIVE CV LAB;  Service: Cardiovascular;  Laterality: N/A;  . LEAD REVISION/REPAIR N/A 04/08/2017   Procedure: Atrial Lead Revision/Repair;  Surgeon: Duke Salvia, MD;  Location: [facility] INVASIVE CV LAB;  Service: Cardiovascular;  Laterality: N/A;  . LEFT HEART CATHETERIZATION WITH CORONARY/GRAFT ANGIOGRAM N/A 10/30/2012   Procedure: LEFT HEART CATHETERIZATION WITH Isabel Caprice;  Surgeon: [name], MD;  Location: [facility] CATH LAB;  Service: Cardiovascular;  Laterality: N/A;  . LIVER BIOPSY N/A 06/05/2017   Procedure: SINGLE SITE LIVER BIOPSY ERAS PATHWAY;  Surgeon: [name], MD;  Location: [facility] OR;  Service: General;  Laterality: N/A;  ERAS PATHWAY  . LUMBAR FUSION  09/2007    Current Outpatient Medications  Medication Sig Dispense Refill  . acetaminophen (TYLENOL) 325 MG tablet Take 325 mg by mouth 3 (three) times daily as needed for mild pain.    [name] alfuzosin (UROXATRAL) 10 MG 24 hr tablet Take 10 mg by mouth every evening.     [name] apixaban (ELIQUIS) 5 MG TABS tablet Take 1 tablet (5 mg total) by mouth 2 (two) times daily. 60 tablet 3  . aspirin EC 81 MG tablet Take 81 mg by mouth every evening.    [name] atenolol (TENORMIN) 50 MG tablet TAKE ONE (1) TABLET BY MOUTH EVERY DAY (Patient taking differently: Take 50 mg by mouth every evening. TAKE ONE (1) TABLET BY MOUTH EVERY DAY) 90 tablet 0  . atorvastatin (LIPITOR) 20 MG tablet TAKE ONE (1) TABLET BY MOUTH EVERY DAY AT 6PM 90 tablet 0  . betamethasone  dipropionate (DIPROLENE) 0.05 % cream Apply 1 application topically daily. 45 g 2  . Cholecalciferol (VITAMIN D3) 400 units CAPS Take 400 Units by mouth daily.    . diazepam (VALIUM) 5 MG tablet Take 0.5-1 tablets (2.5-5 mg total) by mouth every 12 (twelve) hours as needed for anxiety. 30 tablet 2  . dutasteride (AVODART) 0.5 MG capsule Take 0.5 mg by mouth every evening.     . furosemide (LASIX) 20 MG tablet Take 1 tablet (20 mg total) by mouth daily. Take 40 mg daily for 3 days; then resume 20 mg daily 90 tablet 3  . hydrocortisone (ANUSOL-HC) 2.5 % rectal cream bid 30 g 1  . linaclotide (LINZESS) 290 MCG CAPS capsule Take 1 capsule (290 mcg total) by mouth as needed. 30 capsule 11  . loratadine (CLARITIN) 10  MG tablet Take 1 tablet (10 mg total) by mouth daily. 100 tablet 3  . losartan (COZAAR) 50 MG tablet Take 0.5 tablets (25 mg total) by mouth daily. 90 tablet 1  . Multiple Vitamin (MULTIVITAMIN WITH MINERALS) TABS Take 1 tablet by mouth daily.    . nitroGLYCERIN (NITROSTAT) 0.4 MG SL tablet PLACE ONE TABLET UNDER TONGUE EVERY 5 MINUTES AS NEEDED FOR CHEST PAIN (3 DOSES MAX) 25 tablet 0  . vitamin B-12 (CYANOCOBALAMIN) 500 MCG tablet Take 500 mcg by mouth every evening.      No current facility-administered medications for this visit.     Allergies  Allergen Reactions  . Ezetimibe Other (See Comments)    Pt was in coma for 6 days   numbness  . Penicillins Other (See Comments)    BLISTERS BETWEEN FINGERS  Has patient had a PCN reaction causing immediate rash, facial/tongue/throat swelling, SOB or lightheadedness with hypotension: unknown Has patient had a PCN reaction causing severe rash involving mucus membranes or skin necrosis: unknown Has patient had a PCN reaction that required hospitalization no Has patient had a PCN reaction occurring within the last 10 years: no If all of the above answers are "NO", then may proceed with Cephalosporin use.   . Pravastatin Sodium Other (See  Comments)    Aches and pains in joints  . Rosuvastatin Other (See Comments)    MYALGIAS  . Niacin Anxiety    "Makes me feel nervous, jittery"    Review of Systems negative except from HPI and PMH  Physical Exam BP 140/72 (BP Location: Left Arm, Patient Position: Sitting, Cuff Size: Normal)   Pulse 74   Ht 5\' 7"  (1.702 m)   Wt 147 lb 12 oz (67 kg)   BMI 23.14 kg/m  Well developed and nourished in no acute distress HENT normal Neck supple with JVP-8 Clear Device pocket well healed; without hematoma or erythema.  There is no tethering  Irregularly irregular rate and rhythm, no murmurs or gallops Abd-soft with active BS No Clubbing cyanosis 1+ edema Skin-warm and dry A & Oriented  Grossly normal sensory and motor function      ECG atrial fibrillation with variable conduction and intermittent ventricular pacing   Assessment and  Plan  Atrial fibrillation/flutter-persistent  PVCs-right bundle branch superior axis    Cardiomyopathy recurrent  History of aborted sudden cardiac death    Implantable defibrillator-St. Jude   upgraded single-dual-chamber May 2018  Ischemic cardiomyopathy with prior CABG    Congestive Heart Failure , acute chronic systolic   Right bundle branch block.  Sinus bradycardia/chronotropic incompetence  Hypotension   His heart failure is somewhat better with the initiation of diuresis.  The mechanisms contributing to it are outlined below.  We will continue him on diuresis but I think restoration of sinus rhythm is the most important next step as his deterioration is clearly coincidental with the emergence of his atrial fibrillation/flutter. The cause of his cardiomyopathy is not clear.  Potential contributors include pacing, PVCs, and atrial fibrillation with a rapid rate. Hypotension is been a little bit of a problem.  He will take his losartan at night.  With his cardiomyopathy, we will change his beta-blocker from atenolol--metoprolol  succinate.  Reviewing device interrogation it looks as if he is been in persistent atrial tachyarrhythmia since 3/20.  We will undertake cardioversion.  Need for a antiarrhythmic drug therapy would be informed by the frequency of recurrence.  We have reviewed the use of his blood pressure  machine at home as well as taking his pulse.  He is accurately able to detect regularity from irregularity.  He would do this on a daily basis and let us know when or if he recurs.  Following restoration of sinus rhythm, minimizing of ventricular pacing we will need to reassess LV function.  CRT upgrade is something to consider  More than 50% of 40 min was spent in counseling related to the above

## 2018-03-24 NOTE — H&P (View-Only) (Signed)
2015     Patient Care Team: Plotnikov, Georgina Quint, MD as PCP - General (Internal Medicine) Wendall Stade, MD as PCP - Cardiology (Cardiology) Duke Salvia, MD as PCP - Electrophysiology (Cardiology) Lindell Noe Lehman Prom., MD (Urology) [name], MD as Consulting Physician (General Surgery) Pyrtle, Carie Caddy, MD as Consulting Physician (Gastroenterology)   HPI  [name] Christensen is a 84 y.o. male Seen follow up for ICD implantation-single chamber undertaken for aborted cardiac arrest in the setting of ischemic heart disease with prior bypass surgery. He also has a history of frequent PVCs  Catheterization 12/13 demonstrating patent grafts with an atretic LIMA; LV function was normal S/p CABG    DATE TEST PVC  1/15 Holter 13%  1/18 Holter 31%     DATE TEST EF   12/13 Cath   Patent Grafts  2/15 Echo   45-50 %   12/17 Echo  EF 50-55   4/19 Echo 20-25%     Date Cr K Hgb  5/18 0.97  12.2  7/18 1.31  10.5  12.18  1.00 4.7 12.2   2/19 device interrogation had demonstrated recurrent episodes of "self terminating atrial fibrillation."  His device was reprogrammed to DDIR to avoid tracking  Looking back at remote monitoring, rather than having self terminating atrial arrhythmias it looks like there was undersensing because of blanking. 4/19 saw STW with symptoms of dyspnea and fatigue with minimal activity.  No nocturnal dyspnea.  Positive edema.  His symptoms are better since having been started on a diuretic; there is less edema.  He is having some nocturnal dyspnea however now  He has had a vague nonexertional chest discomfort.   Past Medical History:  Diagnosis Date  . AICD (automatic cardioverter/defibrillator) present 04/07/2017  . Allergy   . Anxiety   . BPH (benign prostatic hypertrophy)    elevated PSA Dr. Lindell Noe Bx 2010  . Cardiac arrest [facility])    a. out-of-hospital arrest 10/30/2012 - EF 40-45%, patent grafts on cath, received St. Jude AICD.  [name] Carotid  disease, bilateral (HCC)    a. 0-39% by doppers.  . Cataract    bil cataracts removed  . CHF (congestive heart failure) (HCC)   . Coronary artery disease    a. s/p MI/CABG 2005. b. s/p cath at time of VF arrest 10/2013 - grafts patent.  . Cough   . Diverticulosis   . Elevated LFTs    a. 10/2012 felt due to cardiac arrest - Hepatitis C Ab reactive from 11/22/2012>>Hep C RNA PCR negative 11/13/2012.   [name] GERD (gastroesophageal reflux disease)   . Hyperlipidemia   . Hypertension   . Internal hemorrhoids   . Myocardial infarction (HCC)    2005 - CABG x 5 2005  . RBBB   . Tubular adenoma of colon   . Ventricular bigeminy    a. Event monitor 01/2013: NSR with PVCs and occ bigeminy.    Past Surgical History:  Procedure Laterality Date  . APPENDECTOMY    . CARDIAC CATHETERIZATION  2007   with patent graft anatomy atretic left internal mammary  artery to the LAD which is nonobstructive. Will restart study June 08, 2007  . CATARACT EXTRACTION W/PHACO Right 04/23/2016   Procedure: CATARACT EXTRACTION PHACO AND INTRAOCULAR LENS PLACEMENT (IOC);  Surgeon: [name], MD;  Location: [facility] ORS;  Service: Ophthalmology;  Laterality: Right;  [place] 1.09 AP% 19.3 CDE 13.38 Fluid pack lot # [id number]  . CHOLECYSTECTOMY N/A 06/05/2017   Procedure: LAPAROSCOPIC  CHOLECYSTECTOMY WITH INTRAOPERATIVE CHOLANGIOGRAM ERAS PATHWAY POSSIBLE NEEDLE CORE BIOPSY OF LIVER;  Surgeon: [name], MD;  Location: [facility] OR;  Service: General;  Laterality: N/A;  ERAS PATHWAY  . COLONOSCOPY    . CORONARY ARTERY BYPASS GRAFT  2005  . EYE SURGERY    . ICD GENERATOR CHANGEOUT N/A 04/07/2017   Procedure: ICD Generator Changeout;  Surgeon: Duke Salvia, MD;  Location: [facility] INVASIVE CV LAB;  Service: Cardiovascular;  Laterality: N/A;  . IMPLANTABLE CARDIOVERTER DEFIBRILLATOR IMPLANT N/A 10/28/2012   STJ single chamber ICD implanted by Dr Graciela Husbands for cardiac arrest   . INGUINAL HERNIA REPAIR Bilateral 01/17/2015   Procedure:  BILATERAL LAPAROSCOPIC INGUINAL HERNIA REPAIR WITH LEFT FEMORAL HERNIA REPAIR;  Surgeon: [name], MD;  Location: [facility] OR;  Service: General;  Laterality: Bilateral;  . INSERTION OF MESH Bilateral 01/17/2015   Procedure: INSERTION OF MESH;  Surgeon: [name], MD;  Location: [facility] OR;  Service: General;  Laterality: Bilateral;  . LAPAROSCOPIC CHOLECYSTECTOMY  06/05/2017  . LEAD INSERTION N/A 04/07/2017   Procedure: RA Lead Insertion;  Surgeon: Duke Salvia, MD;  Location: [facility] INVASIVE CV LAB;  Service: Cardiovascular;  Laterality: N/A;  . LEAD REVISION/REPAIR N/A 04/08/2017   Procedure: Atrial Lead Revision/Repair;  Surgeon: Duke Salvia, MD;  Location: [facility] INVASIVE CV LAB;  Service: Cardiovascular;  Laterality: N/A;  . LEFT HEART CATHETERIZATION WITH CORONARY/GRAFT ANGIOGRAM N/A 10/30/2012   Procedure: LEFT HEART CATHETERIZATION WITH Isabel Caprice;  Surgeon: [name], MD;  Location: [facility] CATH LAB;  Service: Cardiovascular;  Laterality: N/A;  . LIVER BIOPSY N/A 06/05/2017   Procedure: SINGLE SITE LIVER BIOPSY ERAS PATHWAY;  Surgeon: [name], MD;  Location: [facility] OR;  Service: General;  Laterality: N/A;  ERAS PATHWAY  . LUMBAR FUSION  09/2007    Current Outpatient Medications  Medication Sig Dispense Refill  . acetaminophen (TYLENOL) 325 MG tablet Take 325 mg by mouth 3 (three) times daily as needed for mild pain.    [name] alfuzosin (UROXATRAL) 10 MG 24 hr tablet Take 10 mg by mouth every evening.     [name] apixaban (ELIQUIS) 5 MG TABS tablet Take 1 tablet (5 mg total) by mouth 2 (two) times daily. 60 tablet 3  . aspirin EC 81 MG tablet Take 81 mg by mouth every evening.    [name] atenolol (TENORMIN) 50 MG tablet TAKE ONE (1) TABLET BY MOUTH EVERY DAY (Patient taking differently: Take 50 mg by mouth every evening. TAKE ONE (1) TABLET BY MOUTH EVERY DAY) 90 tablet 0  . atorvastatin (LIPITOR) 20 MG tablet TAKE ONE (1) TABLET BY MOUTH EVERY DAY AT 6PM 90 tablet 0  . betamethasone  dipropionate (DIPROLENE) 0.05 % cream Apply 1 application topically daily. 45 g 2  . Cholecalciferol (VITAMIN D3) 400 units CAPS Take 400 Units by mouth daily.    . diazepam (VALIUM) 5 MG tablet Take 0.5-1 tablets (2.5-5 mg total) by mouth every 12 (twelve) hours as needed for anxiety. 30 tablet 2  . dutasteride (AVODART) 0.5 MG capsule Take 0.5 mg by mouth every evening.     . furosemide (LASIX) 20 MG tablet Take 1 tablet (20 mg total) by mouth daily. Take 40 mg daily for 3 days; then resume 20 mg daily 90 tablet 3  . hydrocortisone (ANUSOL-HC) 2.5 % rectal cream bid 30 g 1  . linaclotide (LINZESS) 290 MCG CAPS capsule Take 1 capsule (290 mcg total) by mouth as needed. 30 capsule 11  . loratadine (CLARITIN) 10  MG tablet Take 1 tablet (10 mg total) by mouth daily. 100 tablet 3  . losartan (COZAAR) 50 MG tablet Take 0.5 tablets (25 mg total) by mouth daily. 90 tablet 1  . Multiple Vitamin (MULTIVITAMIN WITH MINERALS) TABS Take 1 tablet by mouth daily.    . nitroGLYCERIN (NITROSTAT) 0.4 MG SL tablet PLACE ONE TABLET UNDER TONGUE EVERY 5 MINUTES AS NEEDED FOR CHEST PAIN (3 DOSES MAX) 25 tablet 0  . vitamin B-12 (CYANOCOBALAMIN) 500 MCG tablet Take 500 mcg by mouth every evening.      No current facility-administered medications for this visit.     Allergies  Allergen Reactions  . Ezetimibe Other (See Comments)    Pt was in coma for 6 days   numbness  . Penicillins Other (See Comments)    BLISTERS BETWEEN FINGERS  Has patient had a PCN reaction causing immediate rash, facial/tongue/throat swelling, SOB or lightheadedness with hypotension: unknown Has patient had a PCN reaction causing severe rash involving mucus membranes or skin necrosis: unknown Has patient had a PCN reaction that required hospitalization no Has patient had a PCN reaction occurring within the last 10 years: no If all of the above answers are "NO", then may proceed with Cephalosporin use.   . Pravastatin Sodium Other (See  Comments)    Aches and pains in joints  . Rosuvastatin Other (See Comments)    MYALGIAS  . Niacin Anxiety    "Makes me feel nervous, jittery"    Review of Systems negative except from HPI and PMH  Physical Exam BP 140/72 (BP Location: Left Arm, Patient Position: Sitting, Cuff Size: Normal)   Pulse 74   Ht 5\' 7"  (1.702 m)   Wt 147 lb 12 oz (67 kg)   BMI 23.14 kg/m  Well developed and nourished in no acute distress HENT normal Neck supple with JVP-8 Clear Device pocket well healed; without hematoma or erythema.  There is no tethering  Irregularly irregular rate and rhythm, no murmurs or gallops Abd-soft with active BS No Clubbing cyanosis 1+ edema Skin-warm and dry A & Oriented  Grossly normal sensory and motor function      ECG atrial fibrillation with variable conduction and intermittent ventricular pacing   Assessment and  Plan  Atrial fibrillation/flutter-persistent  PVCs-right bundle branch superior axis    Cardiomyopathy recurrent  History of aborted sudden cardiac death    Implantable defibrillator-St. Jude   upgraded single-dual-chamber May 2018  Ischemic cardiomyopathy with prior CABG    Congestive Heart Failure , acute chronic systolic   Right bundle branch block.  Sinus bradycardia/chronotropic incompetence  Hypotension   His heart failure is somewhat better with the initiation of diuresis.  The mechanisms contributing to it are outlined below.  We will continue him on diuresis but I think restoration of sinus rhythm is the most important next step as his deterioration is clearly coincidental with the emergence of his atrial fibrillation/flutter. The cause of his cardiomyopathy is not clear.  Potential contributors include pacing, PVCs, and atrial fibrillation with a rapid rate. Hypotension is been a little bit of a problem.  He will take his losartan at night.  With his cardiomyopathy, we will change his beta-blocker from atenolol--metoprolol  succinate.  Reviewing device interrogation it looks as if he is been in persistent atrial tachyarrhythmia since 3/20.  We will undertake cardioversion.  Need for a antiarrhythmic drug therapy would be informed by the frequency of recurrence.  We have reviewed the use of his blood pressure  machine at home as well as taking his pulse.  He is accurately able to detect regularity from irregularity.  He would do this on a daily basis and let us know when or if he recurs.  Following restoration of sinus rhythm, minimizing of ventricular pacing we will need to reassess LV function.  CRT upgrade is something to consider  More than 50% of 40 min was spent in counseling related to the above

## 2018-03-24 NOTE — Patient Instructions (Addendum)
Medication Instructions: - Your physician has recommended you make the following change in your medication:  1) START toprol (metoprolol succinate) 25 mg- take 1 tablet by mouth ONCE daily  Labwork: - none ordered  Procedures/Testing: - Your physician has requested that you have an echocardiogram in 3 months (just prior to your follow up with Dr. [name]). Echocardiography is a painless test that uses sound waves to create images of your heart. It provides your doctor with information about the size and shape of your heart and how well your heart's chambers and valves are working. This procedure takes approximately one hour. There are no restrictions for this procedure.  - Your physician has recommended that you have a Cardioversion (DCCV). Electrical Cardioversion uses a jolt of electricity to your heart either through paddles or wired patches attached to your chest. This is a controlled, usually prescheduled, procedure. Defibrillation is done under light anesthesia in the hospital, and you usually go home the day of the procedure. This is done to get your heart back into a normal rhythm. You are not awake for the procedure.   You are scheduled for a Cardioversion on  Friday 03/27/18 with Dr. Fletcher Anon. Please arrive at the [facility] at __6:30 am___ on the day of your procedure.  DIET INSTRUCTIONS:  Nothing to eat or drink after midnight except your medications with a              sip of water.         1) Labs: ___n/a_______________  2) Medications:  YOU MAY TAKE ALL of your regular medications with a small amount of water the morning of your procedure except for lasix (furosemide)  3) Must have a responsible person to drive you home.  4) Bring a current list of your medications and current insurance cards.    If you have any questions after you get home, please call the office at [phone]   Follow-Up: - Your physician recommends that you schedule a follow-up appointment in: 3  months with Dr. [name].   Any Additional Special Instructions Will Be Listed Below (If Applicable).     If you need a refill on your cardiac medications before your next appointment, please call your pharmacy.

## 2018-03-27 ENCOUNTER — Encounter
Admission: RE | Disposition: A | Discharge status: Home / Self Care | Payer: Self-pay | Source: Ambulatory Visit | Attending: Cardiovascular Disease

## 2018-03-27 ENCOUNTER — Encounter: Payer: Self-pay | Admitting: *Deleted

## 2018-03-27 ENCOUNTER — Ambulatory Visit: Payer: PPO | Admitting: Anesthesiology

## 2018-03-27 ENCOUNTER — Ambulatory Visit
Admission: RE | Admit: 2018-03-27 | Discharge: 2018-03-27 | Disposition: A | Discharge status: Home / Self Care | Payer: PPO | Source: Ambulatory Visit | Attending: Cardiovascular Disease | Admitting: Cardiovascular Disease

## 2018-03-27 DIAGNOSIS — Z7901 Long term (current) use of anticoagulants: Secondary | ICD-10-CM | POA: Diagnosis not present

## 2018-03-27 DIAGNOSIS — I110 Hypertensive heart disease with heart failure: Secondary | ICD-10-CM | POA: Diagnosis not present

## 2018-03-27 DIAGNOSIS — Z79899 Other long term (current) drug therapy: Secondary | ICD-10-CM | POA: Diagnosis not present

## 2018-03-27 DIAGNOSIS — I081 Rheumatic disorders of both mitral and tricuspid valves: Secondary | ICD-10-CM | POA: Insufficient documentation

## 2018-03-27 DIAGNOSIS — E785 Hyperlipidemia, unspecified: Secondary | ICD-10-CM | POA: Insufficient documentation

## 2018-03-27 DIAGNOSIS — I959 Hypotension, unspecified: Secondary | ICD-10-CM | POA: Diagnosis not present

## 2018-03-27 DIAGNOSIS — Z95810 Presence of automatic (implantable) cardiac defibrillator: Secondary | ICD-10-CM | POA: Insufficient documentation

## 2018-03-27 DIAGNOSIS — Z951 Presence of aortocoronary bypass graft: Secondary | ICD-10-CM | POA: Insufficient documentation

## 2018-03-27 DIAGNOSIS — I4510 Unspecified right bundle-branch block: Secondary | ICD-10-CM | POA: Insufficient documentation

## 2018-03-27 DIAGNOSIS — K219 Gastro-esophageal reflux disease without esophagitis: Secondary | ICD-10-CM | POA: Insufficient documentation

## 2018-03-27 DIAGNOSIS — Z981 Arthrodesis status: Secondary | ICD-10-CM | POA: Insufficient documentation

## 2018-03-27 DIAGNOSIS — I4589 Other specified conduction disorders: Secondary | ICD-10-CM | POA: Insufficient documentation

## 2018-03-27 DIAGNOSIS — I493 Ventricular premature depolarization: Secondary | ICD-10-CM | POA: Insufficient documentation

## 2018-03-27 DIAGNOSIS — F419 Anxiety disorder, unspecified: Secondary | ICD-10-CM | POA: Diagnosis not present

## 2018-03-27 DIAGNOSIS — Z86010 Personal history of colonic polyps: Secondary | ICD-10-CM | POA: Diagnosis not present

## 2018-03-27 DIAGNOSIS — I2510 Atherosclerotic heart disease of native coronary artery without angina pectoris: Secondary | ICD-10-CM | POA: Diagnosis not present

## 2018-03-27 DIAGNOSIS — Z8674 Personal history of sudden cardiac arrest: Secondary | ICD-10-CM | POA: Insufficient documentation

## 2018-03-27 DIAGNOSIS — I252 Old myocardial infarction: Secondary | ICD-10-CM | POA: Diagnosis not present

## 2018-03-27 DIAGNOSIS — Z7982 Long term (current) use of aspirin: Secondary | ICD-10-CM | POA: Diagnosis not present

## 2018-03-27 DIAGNOSIS — I481 Persistent atrial fibrillation: Secondary | ICD-10-CM | POA: Insufficient documentation

## 2018-03-27 DIAGNOSIS — I469 Cardiac arrest, cause unspecified: Secondary | ICD-10-CM

## 2018-03-27 DIAGNOSIS — I480 Paroxysmal atrial fibrillation: Secondary | ICD-10-CM | POA: Diagnosis not present

## 2018-03-27 DIAGNOSIS — I4891 Unspecified atrial fibrillation: Secondary | ICD-10-CM | POA: Diagnosis not present

## 2018-03-27 DIAGNOSIS — I4892 Unspecified atrial flutter: Secondary | ICD-10-CM | POA: Diagnosis not present

## 2018-03-27 DIAGNOSIS — Z888 Allergy status to other drugs, medicaments and biological substances status: Secondary | ICD-10-CM | POA: Insufficient documentation

## 2018-03-27 DIAGNOSIS — I509 Heart failure, unspecified: Secondary | ICD-10-CM | POA: Insufficient documentation

## 2018-03-27 DIAGNOSIS — Z8719 Personal history of other diseases of the digestive system: Secondary | ICD-10-CM | POA: Insufficient documentation

## 2018-03-27 DIAGNOSIS — I255 Ischemic cardiomyopathy: Secondary | ICD-10-CM | POA: Diagnosis not present

## 2018-03-27 DIAGNOSIS — Z880 Allergy status to penicillin: Secondary | ICD-10-CM | POA: Insufficient documentation

## 2018-03-27 HISTORY — PX: CARDIOVERSION: EP1203

## 2018-03-27 SURGERY — CARDIOVERSION (CATH LAB)
Anesthesia: General

## 2018-03-27 MED ORDER — EPHEDRINE SULFATE 50 MG/ML IJ SOLN
INTRAMUSCULAR | Status: AC
  Filled 2018-03-27: qty 1

## 2018-03-27 MED ORDER — SODIUM CHLORIDE 0.9 % IV SOLN
INTRAVENOUS | Status: DC
  Administered 2018-03-27: 07:00:00 via INTRAVENOUS

## 2018-03-27 MED ORDER — PROPOFOL 10 MG/ML IV BOLUS
INTRAVENOUS | Status: DC | PRN
  Administered 2018-03-27: 20 mg via INTRAVENOUS
  Administered 2018-03-27: 30 mg via INTRAVENOUS
  Administered 2018-03-27: 10 mg via INTRAVENOUS

## 2018-03-27 MED ORDER — PROPOFOL 10 MG/ML IV BOLUS
INTRAVENOUS | Status: AC
  Filled 2018-03-27: qty 20

## 2018-03-27 MED ORDER — PHENYLEPHRINE HCL 10 MG/ML IJ SOLN
INTRAMUSCULAR | Status: AC
Start: 2018-03-27 — End: ?
  Filled 2018-03-27: qty 1

## 2018-03-27 NOTE — Transfer of Care (Signed)
Immediate Anesthesia Transfer of Care Note  [name]  Procedure(s) Performed: CARDIOVERSION (N/A )  Patient Location: PACU  Anesthesia Type:General  Level of Consciousness: sedated  Airway & Oxygen Therapy: Patient Spontanous Breathing and Patient connected to nasal cannula oxygen  Post-op Assessment: Report given to RN and Post -op Vital signs reviewed and stable  Post vital signs: Reviewed and stable  Last Vitals:  Vitals Value Taken Time  BP 125/75 03/27/2018  7:52 AM  Temp    Pulse 76 03/27/2018  7:52 AM  Resp 16 03/27/2018  7:52 AM  SpO2 98 % 03/27/2018  7:52 AM    Last Pain:  Vitals:   03/27/18 0640  PainSc: 0-No pain         Complications: No apparent anesthesia complications

## 2018-03-27 NOTE — Anesthesia Post-op Follow-up Note (Signed)
Anesthesia QCDR form completed.        

## 2018-03-27 NOTE — Interval H&P Note (Signed)
History and Physical Interval Note:  03/27/2018 7:57 AM  [name] Christensen  has presented today for surgery, with the diagnosis of Cardioversion  Afib  The various methods of treatment have been discussed with the patient and family. After consideration of risks, benefits and other options for treatment, the patient has consented to  Procedure(s): CARDIOVERSION (N/A) as a surgical intervention .  The patient's history has been reviewed, patient examined, no change in status, stable for surgery.  I have reviewed the patient's chart and labs.  Questions were answered to the patient's satisfaction.     [name]

## 2018-03-27 NOTE — Anesthesia Postprocedure Evaluation (Signed)
Anesthesia Post Note  [name]  Procedure(s) Performed: CARDIOVERSION (N/A )  Patient location during evaluation: Other Anesthesia Type: General Level of consciousness: awake and alert and oriented Pain management: pain level controlled Vital Signs Assessment: post-procedure vital signs reviewed and stable Respiratory status: spontaneous breathing, nonlabored ventilation and respiratory function stable Cardiovascular status: blood pressure returned to baseline and stable Postop Assessment: no signs of nausea or vomiting Anesthetic complications: no     Last Vitals:  Vitals:   03/27/18 0815 03/27/18 0830  BP: 120/84 123/83  Pulse: 86   Resp: 19 17  Temp:    SpO2: 98% 95%    Last Pain:  Vitals:   03/27/18 0830  PainSc: 0-No pain                 Kirbi Farrugia

## 2018-03-27 NOTE — CV Procedure (Signed)
Cardioversion note: A standard informed consent was obtained. Timeout was performed. The pads were placed in the anterior posterior fashion. The patient was given propofol by the anesthesia team.  Successful cardioversion was performed with a 100 J. The patient converted to AV paced rhythm Pre-and post EKGs were reviewed. The patient tolerated the procedure with no immediate complications.  Recommendations: Continue same medications and follow-up in 2-3 weeks.

## 2018-03-27 NOTE — Anesthesia Preprocedure Evaluation (Signed)
Anesthesia Evaluation  Patient identified by MRN, date of birth, ID band Patient awake    Reviewed: Allergy & Precautions, NPO status , Patient's Chart, lab work & pertinent test results  History of Anesthesia Complications Negative for: history of anesthetic complications  Airway Mallampati: III  TM Distance: <3 FB Neck ROM: Full    Dental no notable dental hx.    Pulmonary neg pulmonary ROS, neg sleep apnea, neg COPD,    breath sounds clear to auscultation- rhonchi (-) wheezing      Cardiovascular hypertension, + CAD, + Past MI, + CABG (2005) and +CHF  + dysrhythmias Atrial Fibrillation + pacemaker + Cardiac Defibrillator  Rhythm:Regular Rate:Normal - Systolic murmurs and - Diastolic murmurs Echo 03/16/18: - Left ventricle: Systolic function was severely reduced. The   estimated ejection fraction was in the range of 25% to 30%.   Diffuse hypokinesis with regional variation and incoordinate   septal motion. The study is not technically sufficient to allow   evaluation of LV diastolic function. - Aortic valve: Sclerosis without stenosis. There was trivial   regurgitation. - Mitral valve: Mildly thickened leaflets . There was mild   regurgitation. - Left atrium: Moderately diated. - Right ventricle: The cavity size was normal. Wall thickness was   normal. AICD wire noted in right ventricle. Systolic function was   normal. - Right atrium: Severely dilated. AICD wire noted in right atrium. - Tricuspid valve: There was moderate regurgitation. - Pulmonary arteries: PA peak pressure: 50 mm Hg (S). - Inferior vena cava: The vessel was dilated. The respirophasic   diameter changes were blunted (< 50%), consistent with elevated   central venous pressure.   Neuro/Psych Anxiety negative neurological ROS     GI/Hepatic Neg liver ROS, GERD  ,  Endo/Other  negative endocrine ROSneg diabetes  Renal/GU negative Renal ROS      Musculoskeletal negative musculoskeletal ROS (+)   Abdominal (+) - obese,   Peds  Hematology negative hematology ROS (+)   Anesthesia Other Findings Past Medical History: 04/07/2017: AICD (automatic cardioverter/defibrillator) present No date: Allergy No date: Anxiety No date: BPH (benign prostatic hypertrophy)     Comment:  elevated PSA Dr. Lindell Noe Bx 2010 No date: Cardiac arrest [facility])     Comment:  a. out-of-hospital arrest 10/27/2012 - EF 40-45%, patent               grafts on cath, received St. Jude AICD. No date: Carotid disease, bilateral (HCC)     Comment:  a. 0-39% by doppers. No date: Cataract     Comment:  bil cataracts removed No date: CHF (congestive heart failure) (HCC) No date: Coronary artery disease     Comment:  a. s/p MI/CABG 2005. b. s/p cath at time of VF arrest               10/2013 - grafts patent. No date: Cough No date: Diverticulosis No date: Elevated LFTs     Comment:  a. 10/2012 felt due to cardiac arrest - Hepatitis C Ab               reactive from 2012-11-21>>Hep C RNA PCR negative 11/18/2012.  No date: GERD (gastroesophageal reflux disease) No date: Hyperlipidemia No date: Hypertension No date: Internal hemorrhoids No date: Myocardial infarction [facility])     Comment:  2005 - CABG x 5 2005 No date: RBBB No date: Tubular adenoma of colon No date: Ventricular bigeminy     Comment:  a. Event monitor 01/2013: NSR  with PVCs and occ bigeminy.   Reproductive/Obstetrics                             Anesthesia Physical Anesthesia Plan  ASA: III  Anesthesia Plan: General   Post-op Pain Management:    Induction: Intravenous  PONV Risk Score and Plan: 1 and Propofol infusion  Airway Management Planned: Natural Airway  Additional Equipment:   Intra-op Plan:   Post-operative Plan:   Informed Consent: I have reviewed the patients History and Physical, chart, labs and discussed the procedure including the risks, benefits  and alternatives for the proposed anesthesia with the patient or authorized representative who has indicated his/her understanding and acceptance.   Dental advisory given  Plan Discussed with: CRNA and Anesthesiologist  Anesthesia Plan Comments:         Anesthesia Quick Evaluation

## 2018-03-27 NOTE — Anesthesia Procedure Notes (Signed)
Date/Time: 03/27/2018 7:35 AM Performed by: Johnna Acosta, CRNA Pre-anesthesia Checklist: Patient identified, Emergency Drugs available, Suction available, Patient being monitored and Timeout performed Patient Re-evaluated:Patient Re-evaluated prior to induction Oxygen Delivery Method: Nasal cannula Preoxygenation: Pre-oxygenation with 100% oxygen

## 2018-03-30 ENCOUNTER — Encounter: Payer: Self-pay | Admitting: Cardiovascular Disease

## 2018-04-07 ENCOUNTER — Ambulatory Visit (INDEPENDENT_AMBULATORY_CARE_PROVIDER_SITE_OTHER): Payer: PPO | Admitting: *Deleted

## 2018-04-07 DIAGNOSIS — I469 Cardiac arrest, cause unspecified: Secondary | ICD-10-CM | POA: Diagnosis not present

## 2018-04-07 NOTE — Progress Notes (Signed)
Remote ICD transmission.   

## 2018-04-08 ENCOUNTER — Ambulatory Visit: Payer: PPO | Admitting: Internal Medicine

## 2018-04-10 ENCOUNTER — Encounter: Payer: Self-pay | Admitting: Cardiology

## 2018-04-10 ENCOUNTER — Encounter: Payer: Self-pay | Admitting: Internal Medicine

## 2018-04-10 ENCOUNTER — Ambulatory Visit (INDEPENDENT_AMBULATORY_CARE_PROVIDER_SITE_OTHER): Payer: PPO | Admitting: Internal Medicine

## 2018-04-10 VITALS — BP 126/82 | HR 86 | Ht 67.0 in | Wt 146.0 lb

## 2018-04-10 DIAGNOSIS — J479 Bronchiectasis, uncomplicated: Secondary | ICD-10-CM

## 2018-04-10 LAB — PULMONARY FUNCTION TEST
DL/VA % pred: 108 %
DL/VA: 4.74 ml/min/mmHg/L
DLCO cor % pred: 85 %
DLCO cor: 24.33 ml/min/mmHg
DLCO unc % pred: 78 %
DLCO unc: 22.07 ml/min/mmHg
FEF 25-75 Post: 1.49 L/sec
FEF 25-75 Pre: 1.15 L/sec
FEF2575-%Change-Post: 28 %
FEF2575-%Pred-Post: 97 %
FEF2575-%Pred-Pre: 75 %
FEV1-%Change-Post: 6 %
FEV1-%Pred-Post: 90 %
FEV1-%Pred-Pre: 85 %
FEV1-Post: 2.13 L
FEV1-Pre: 2 L
FEV1FVC-%Change-Post: 5 %
FEV1FVC-%Pred-Pre: 97 %
FEV6-%Change-Post: 0 %
FEV6-%Pred-Post: 93 %
FEV6-%Pred-Pre: 92 %
FEV6-Post: 2.9 L
FEV6-Pre: 2.88 L
FEV6FVC-%Change-Post: 0 %
FEV6FVC-%Pred-Post: 107 %
FEV6FVC-%Pred-Pre: 107 %
FVC-%Change-Post: 1 %
FVC-%Pred-Post: 86 %
FVC-%Pred-Pre: 85 %
FVC-Post: 2.94 L
FVC-Pre: 2.9 L
Post FEV1/FVC ratio: 73 %
Post FEV6/FVC ratio: 99 %
Pre FEV1/FVC ratio: 69 %
Pre FEV6/FVC Ratio: 99 %
RV % pred: 138 %
RV: 3.54 L
TLC % pred: 101 %
TLC: 6.55 L

## 2018-04-10 NOTE — Progress Notes (Signed)
PFT completed today.  

## 2018-04-10 NOTE — Patient Instructions (Signed)
Pulmonary follow up is as needed        

## 2018-04-10 NOTE — Progress Notes (Signed)
Subjective:    Patient ID: [name] Christensen, male    DOB: August 11, 1932  MRN: [id number]   Brief patient profile:  65 yowm never smoker s/p cardiac arrest Oct 31 2012 requiring artic sun and ever since Nov 10 2012  placement of debrillator every deep breath makes him cough> eval with ? pna 12/14/12  > rx levaquin some better  referred 12/16/2012 to pulmonary clinic by Dr Posey Rea with dx of bronchiectasis on ct chest 02/04/18     History of Present Illness  12/16/2012 1st pulmonary eval cc intermittent chills, severe cough since Dec 17 when takes breath in started levquin 1/20with ? pna and starting to feel better, sob only with exertion, no excess mucus production. rec Take zantac (ranitidine) 150 mg after bfast and after supper whether you think you need it or not for now GERD  For cough use tramadol 50 mg one every 4 hours if needed     01/28/2013 f/u ov/[name] Christensen cc completely back to baseline, no cough or sob, not taking any cough meds for a week, still on the zantac at hs. rec Don't stop the zantac until 100% better for at least a week without the need for any cough medication.         03/10/2018 1st   Pulmonary office visit/ last seen 06/2013 [facility]   Chief Complaint  Patient presents with  . Pulmonary Consult    Referred by Dr. Posey Rea for eval of pulmonary infiltrates. He c/o SOB with walking short distances such as from lobby to exam room today.   coughing since 1st of Jan 2019 >  Severe noct > resolved  Did have some sob with bad cough but after the cough resolved the breathing improved also to point where baseline with walking /fine sitting and sleeping then gradually again worse over the 2 weeks prior to OV   Where "gives out" p 50 feet assoc with new leg swelling > saw Cards one day prior to OV  With BNP > 6 k rx lasix and f/u Dr Graciela Husbands scheduled  rec No change rx as per cards with plan for diuresis      04/10/2018  f/u ov/[name] Christensen re: abn ct c/w bronchiectasis  Chief Complaint   Patient presents with  . Follow-up    Breathing is markedly improved since last OV. Denies chest tightness, wheezing, SOB or coughing at this time.  Dyspnea:  Work out at gym 2.5-2.8 mph at 4 degrees x one mile  Cough: none Sleep: lie flat hs  now Leg swelling resolved    No obvious day to day or daytime variability or assoc excess/ purulent sputum or mucus plugs or hemoptysis or cp or chest tightness, subjective wheeze or overt sinus or hb symptoms. No unusual exposure hx or h/o childhood pna/ asthma or knowledge of premature birth.  Sleeping  Lie flat   without nocturnal  or early am exacerbation  of respiratory  c/o's or need for noct saba. Also denies any obvious fluctuation of symptoms with weather or environmental changes or other aggravating or alleviating factors except as outlined above   Current Allergies, Complete Past Medical History, Past Surgical History, Family History, and Social History were reviewed in [employer] record.  ROS  The following are not active complaints unless bolded Hoarseness, sore throat, dysphagia, dental problems, itching, sneezing,  nasal congestion or discharge of excess mucus or purulent secretions, ear ache,   fever, chills, sweats, unintended wt loss or wt gain, classically pleuritic  or exertional cp,  orthopnea pnd or arm/hand swelling  or leg swelling, presyncope, palpitations, abdominal pain, anorexia, nausea, vomiting, diarrhea  or change in bowel habits or change in bladder habits, change in stools or change in urine, dysuria, hematuria,  rash, arthralgias, visual complaints, headache, numbness, weakness or ataxia or problems with walking or coordination,  change in mood or  memory.        Current Meds  Medication Sig  . acetaminophen (TYLENOL) 325 MG tablet Take 325 mg by mouth 3 (three) times daily as needed for mild pain.  [name] alfuzosin (UROXATRAL) 10 MG 24 hr tablet Take 10 mg by mouth every evening.   [name] apixaban  (ELIQUIS) 5 MG TABS tablet Take 1 tablet (5 mg total) by mouth 2 (two) times daily.  [name] aspirin EC 81 MG tablet Take 81 mg by mouth every evening.  [name] atenolol (TENORMIN) 50 MG tablet TAKE ONE (1) TABLET BY MOUTH EVERY DAY (Patient taking differently: Take 25 mg by mouth every evening. )  . atorvastatin (LIPITOR) 20 MG tablet TAKE 1 TABLET BY MOUTH ONCE A DAY AT 6PM  . betamethasone dipropionate (DIPROLENE) 0.05 % cream Apply 1 application topically daily as needed.  . Cholecalciferol (VITAMIN D3) 400 units CAPS Take 400 Units by mouth daily.  . diazepam (VALIUM) 5 MG tablet Take 0.5-1 tablets (2.5-5 mg total) by mouth every 12 (twelve) hours as needed for anxiety.  . dutasteride (AVODART) 0.5 MG capsule Take 0.5 mg by mouth every evening.   . furosemide (LASIX) 20 MG tablet Take 20 mg by mouth daily as needed.  . hydrocortisone (ANUSOL-HC) 2.5 % rectal cream bid (Patient taking differently: Place 1 application rectally 2 (two) times daily as needed for hemorrhoids or anal itching. )  . linaclotide (LINZESS) 290 MCG CAPS capsule Take 1 capsule (290 mcg total) by mouth as needed. (Patient taking differently: Take 290 mcg by mouth daily as needed (for constipation). )  . losartan (COZAAR) 50 MG tablet Take 0.5 tablets (25 mg total) by mouth daily. (Patient taking differently: Take 25 mg by mouth every evening. )  . metoprolol succinate (TOPROL-XL) 25 MG 24 hr tablet Take 1 tablet (25 mg total) by mouth daily.  . Multiple Vitamin (MULTIVITAMIN WITH MINERALS) TABS Take 1 tablet by mouth daily.  . nitroGLYCERIN (NITROSTAT) 0.4 MG SL tablet PLACE ONE TABLET UNDER TONGUE EVERY 5 MINUTES AS NEEDED FOR CHEST PAIN (3 DOSES MAX)  . Polyethyl Glycol-Propyl Glycol (SYSTANE OP) Place 1 drop into both eyes daily as needed (for dry eyes).  . vitamin B-12 (CYANOCOBALAMIN) 500 MCG tablet Take 500 mcg by mouth daily.                Objective:   Physical Exam  Wt 146 12/31/2012  > 01/28/2013 148 >  07/18/13  158 >  03/10/2018   152 > 04/10/2018  146     12/16/12 145 lb 3.2 oz (65.862 kg)  12/14/12 145 lb (65.772 kg)  11/29/2012 144 lb (65.318 kg)     amb thin wm nad - all smiles today  Vital signs reviewed - Note on arrival 02 sats  96% on RA       HEENT: nl dentition, turbinates bilaterally, and oropharynx. Nl external ear canals without cough reflex   NECK :  without JVD/Nodes/TM/ nl carotid upstrokes bilaterally   LUNGS: no acc muscle use,  Nl contour chest which is clear to A and P bilaterally without cough on insp or exp maneuvers  CV:  slt Irreg rhythm   no s3 or murmur or increase in P2, and no edema   ABD:  soft and nontender with nl inspiratory excursion in the supine position. No bruits or organomegaly appreciated, bowel sounds nl  MS:  Nl gait/ ext warm without deformities, calf tenderness, cyanosis or clubbing No obvious joint restrictions   SKIN: warm and dry without lesions    NEURO:  alert, approp, nl sensorium with  no motor or cerebellar deficits apparent.                 Assessment & Plan:

## 2018-04-11 ENCOUNTER — Encounter: Payer: Self-pay | Admitting: Internal Medicine

## 2018-04-11 NOTE — Assessment & Plan Note (Signed)
See CT chest 02/04/18  - PFT's  04/10/2018  FEV1 2.13 (90 % ) ratio 73  p 6 % improvement from saba p nothing prior to study with DLCO  78/85c% corrects to 108  % for alv volume    Symptoms clearly were more chf than related to bronchiectasis without ongoing cough or any chest symptoms at all.   I had an extended final summary discussion with the patient reviewing all relevant studies completed to date and  lasting 15 to 20 minutes of a 25 minute visit on the following issues:   Reviewed pathophysiology of bronchiectasis emphasizing the impact of poor escalator function in his bronchial tubes using terms he appeared to understand and advised when to call back for f/u here or with Dr Alain Marion but nothing more needed for now and pulmonary f/u can be prn  Each maintenance medication was reviewed in detail including most importantly the difference between maintenance and as needed and under what circumstances the prns are to be used.  Please see AVS for specific  Instructions which are unique to this visit and I personally typed out  which were reviewed in detail in writing with the patient and a copy provided.

## 2018-04-14 LAB — CUP PACEART REMOTE DEVICE CHECK
Battery Remaining Longevity: 68 mo
Battery Remaining Percentage: 82 %
Battery Voltage: 2.98 V
Brady Statistic AP VP Percent: 58 %
Brady Statistic AP VS Percent: 14 %
Brady Statistic AS VP Percent: 17 %
Brady Statistic AS VS Percent: 10 %
Brady Statistic RA Percent Paced: 72 %
Brady Statistic RV Percent Paced: 74 %
Date Time Interrogation Session: 20190514060017
HighPow Impedance: 68 Ohm
HighPow Impedance: 68 Ohm
Implantable Lead Implant Date: 20131218
Implantable Lead Implant Date: 20180514
Implantable Lead Location: 753859
Implantable Lead Location: 753860
Implantable Lead Model: 5076
Implantable Pulse Generator Implant Date: 20180514
Lead Channel Impedance Value: 450 Ohm
Lead Channel Impedance Value: 480 Ohm
Lead Channel Pacing Threshold Amplitude: 0.75 V
Lead Channel Pacing Threshold Amplitude: 1.125 V
Lead Channel Pacing Threshold Pulse Width: 0.5 ms
Lead Channel Pacing Threshold Pulse Width: 0.5 ms
Lead Channel Sensing Intrinsic Amplitude: 12 mV
Lead Channel Sensing Intrinsic Amplitude: 3.2 mV
Lead Channel Setting Pacing Amplitude: 1.375
Lead Channel Setting Pacing Amplitude: 2.5 V
Lead Channel Setting Pacing Pulse Width: 0.5 ms
Lead Channel Setting Sensing Sensitivity: 0.5 mV
Pulse Gen Serial Number: 1245762

## 2018-05-11 DIAGNOSIS — D2270 Melanocytic nevi of unspecified lower limb, including hip: Secondary | ICD-10-CM | POA: Diagnosis not present

## 2018-05-11 DIAGNOSIS — Z1283 Encounter for screening for malignant neoplasm of skin: Secondary | ICD-10-CM | POA: Diagnosis not present

## 2018-05-11 DIAGNOSIS — D485 Neoplasm of uncertain behavior of skin: Secondary | ICD-10-CM | POA: Diagnosis not present

## 2018-05-11 DIAGNOSIS — D225 Melanocytic nevi of trunk: Secondary | ICD-10-CM | POA: Diagnosis not present

## 2018-05-11 DIAGNOSIS — L812 Freckles: Secondary | ICD-10-CM | POA: Diagnosis not present

## 2018-05-11 DIAGNOSIS — L570 Actinic keratosis: Secondary | ICD-10-CM | POA: Diagnosis not present

## 2018-05-11 DIAGNOSIS — D1800 Hemangioma unspecified site: Secondary | ICD-10-CM | POA: Diagnosis not present

## 2018-05-11 DIAGNOSIS — L820 Inflamed seborrheic keratosis: Secondary | ICD-10-CM | POA: Diagnosis not present

## 2018-05-11 DIAGNOSIS — L821 Other seborrheic keratosis: Secondary | ICD-10-CM | POA: Diagnosis not present

## 2018-05-11 DIAGNOSIS — D2260 Melanocytic nevi of unspecified upper limb, including shoulder: Secondary | ICD-10-CM | POA: Diagnosis not present

## 2018-05-11 DIAGNOSIS — L578 Other skin changes due to chronic exposure to nonionizing radiation: Secondary | ICD-10-CM | POA: Diagnosis not present

## 2018-05-12 ENCOUNTER — Ambulatory Visit (INDEPENDENT_AMBULATORY_CARE_PROVIDER_SITE_OTHER): Payer: PPO | Admitting: Internal Medicine

## 2018-05-12 ENCOUNTER — Encounter: Payer: Self-pay | Admitting: Internal Medicine

## 2018-05-12 DIAGNOSIS — Z951 Presence of aortocoronary bypass graft: Secondary | ICD-10-CM

## 2018-05-12 DIAGNOSIS — I5032 Chronic diastolic (congestive) heart failure: Secondary | ICD-10-CM | POA: Diagnosis not present

## 2018-05-12 DIAGNOSIS — I10 Essential (primary) hypertension: Secondary | ICD-10-CM | POA: Diagnosis not present

## 2018-05-12 DIAGNOSIS — M2550 Pain in unspecified joint: Secondary | ICD-10-CM

## 2018-05-12 NOTE — Progress Notes (Signed)
Subjective:  Patient ID: [name] Christensen, male    DOB: 1932/02/04  Age: 84 y.o. MRN: [id number]  CC: No chief complaint on file.   HPI [name] Christensen presents for CAD, anticoagulation, HTN f/u. No chills, SOB, edema, fatigue...  Outpatient Medications Prior to Visit  Medication Sig Dispense Refill  . acetaminophen (TYLENOL) 325 MG tablet Take 325 mg by mouth 3 (three) times daily as needed for mild pain.    [name] alfuzosin (UROXATRAL) 10 MG 24 hr tablet Take 10 mg by mouth every evening.     [name] apixaban (ELIQUIS) 5 MG TABS tablet Take 1 tablet (5 mg total) by mouth 2 (two) times daily. 60 tablet 3  . aspirin EC 81 MG tablet Take 81 mg by mouth every evening.    [name] atenolol (TENORMIN) 50 MG tablet TAKE ONE (1) TABLET BY MOUTH EVERY DAY (Patient taking differently: Take 25 mg by mouth every evening. ) 90 tablet 0  . atorvastatin (LIPITOR) 20 MG tablet TAKE 1 TABLET BY MOUTH ONCE A DAY AT 6PM 90 tablet 1  . betamethasone dipropionate (DIPROLENE) 0.05 % cream Apply 1 application topically daily as needed.    . Cholecalciferol (VITAMIN D3) 400 units CAPS Take 400 Units by mouth daily.    . diazepam (VALIUM) 5 MG tablet Take 0.5-1 tablets (2.5-5 mg total) by mouth every 12 (twelve) hours as needed for anxiety. 30 tablet 2  . dutasteride (AVODART) 0.5 MG capsule Take 0.5 mg by mouth every evening.     . furosemide (LASIX) 20 MG tablet Take 20 mg by mouth daily as needed.    . hydrocortisone (ANUSOL-HC) 2.5 % rectal cream bid (Patient taking differently: Place 1 application rectally 2 (two) times daily as needed for hemorrhoids or anal itching. ) 30 g 1  . linaclotide (LINZESS) 290 MCG CAPS capsule Take 1 capsule (290 mcg total) by mouth as needed. (Patient taking differently: Take 290 mcg by mouth daily as needed (for constipation). ) 30 capsule 11  . losartan (COZAAR) 50 MG tablet Take 0.5 tablets (25 mg total) by mouth daily. (Patient taking differently: Take 25 mg by mouth every evening. ) 90 tablet  1  . metoprolol succinate (TOPROL-XL) 25 MG 24 hr tablet Take 1 tablet (25 mg total) by mouth daily. 90 tablet 3  . Multiple Vitamin (MULTIVITAMIN WITH MINERALS) TABS Take 1 tablet by mouth daily.    . nitroGLYCERIN (NITROSTAT) 0.4 MG SL tablet PLACE ONE TABLET UNDER TONGUE EVERY 5 MINUTES AS NEEDED FOR CHEST PAIN (3 DOSES MAX) 25 tablet 0  . Polyethyl Glycol-Propyl Glycol (SYSTANE OP) Place 1 drop into both eyes daily as needed (for dry eyes).    . vitamin B-12 (CYANOCOBALAMIN) 500 MCG tablet Take 500 mcg by mouth daily.      No facility-administered medications prior to visit.     ROS: Review of Systems  Constitutional: Negative for appetite change, fatigue and unexpected weight change.  HENT: Negative for congestion, nosebleeds, sneezing, sore throat and trouble swallowing.   Eyes: Negative for itching and visual disturbance.  Respiratory: Negative for cough.   Cardiovascular: Negative for chest pain, palpitations and leg swelling.  Gastrointestinal: Negative for abdominal distention, blood in stool, diarrhea and nausea.  Genitourinary: Negative for frequency and hematuria.  Musculoskeletal: Negative for back pain, gait problem, joint swelling and neck pain.  Skin: Negative for rash.  Neurological: Negative for dizziness, tremors, speech difficulty and weakness.  Psychiatric/Behavioral: Negative for agitation, dysphoric mood and sleep disturbance. The  patient is not nervous/anxious.     Objective:  BP 122/74 (BP Location: Left Arm, Patient Position: Sitting, Cuff Size: Normal)   Pulse 81   Temp 98.4 F (36.9 C) (Oral)   Ht 5\' 7"  (1.702 m)   Wt 147 lb (66.7 kg)   SpO2 98%   BMI 23.02 kg/m   BP Readings from Last 3 Encounters:  05/12/18 122/74  04/10/18 126/82  03/27/18 123/83    Wt Readings from Last 3 Encounters:  05/12/18 147 lb (66.7 kg)  04/10/18 146 lb (66.2 kg)  03/27/18 147 lb (66.7 kg)    Physical Exam  Constitutional: He is oriented to person, place, and  time. He appears well-developed. No distress.  NAD  HENT:  Mouth/Throat: Oropharynx is clear and moist.  Eyes: Pupils are equal, round, and reactive to light. Conjunctivae are normal.  Neck: Normal range of motion. No JVD present. No thyromegaly present.  Cardiovascular: Normal rate, regular rhythm, normal heart sounds and intact distal pulses. Exam reveals no gallop and no friction rub.  No murmur heard. Pulmonary/Chest: Effort normal and breath sounds normal. No respiratory distress. He has no wheezes. He has no rales. He exhibits no tenderness.  Abdominal: Soft. Bowel sounds are normal. He exhibits no distension and no mass. There is no tenderness. There is no rebound and no guarding.  Musculoskeletal: Normal range of motion. He exhibits no edema or tenderness.  Lymphadenopathy:    He has no cervical adenopathy.  Neurological: He is alert and oriented to person, place, and time. He has normal reflexes. No cranial nerve deficit. He exhibits normal muscle tone. He displays a negative Romberg sign. Coordination and gait normal.  Skin: Skin is warm and dry. No rash noted.  Psychiatric: He has a normal mood and affect. His behavior is normal. Judgment and thought content normal.    Lab Results  Component Value Date   WBC 5.0 03/09/2018   HGB 11.7 (L) 03/09/2018   HCT 35.1 (L) 03/09/2018   PLT 130 (L) 03/09/2018   GLUCOSE 101 (H) 03/16/2018   CHOL 118 09/11/2016   TRIG 62.0 09/11/2016   HDL 49.60 09/11/2016   LDLCALC 56 09/11/2016   ALT 14 01/29/2018   AST 20 01/29/2018   NA 136 03/16/2018   K 4.4 03/16/2018   CL 98 03/16/2018   CREATININE 1.10 03/16/2018   BUN 21 03/16/2018   CO2 24 03/16/2018   TSH 1.57 01/29/2018   PSA 12.45 (H) 02/06/2016   INR 1.1 04/02/2017   HGBA1C 6.4 10/10/2017    No results found.  Assessment & Plan:   There are no diagnoses linked to this encounter.   No orders of the defined types were placed in this encounter.    Follow-up: No  follow-ups on file.  Walker Kehr, MD

## 2018-05-12 NOTE — Assessment & Plan Note (Signed)
No angina 

## 2018-05-12 NOTE — Patient Instructions (Signed)
Eliquis Prescription Coverage Assistance Understanding insurance coverage can be complicated and time consuming. Let us help make it easier.   Call [email or web address] [phone]) from Monday - Friday, 8 AM - 8 PM (EST) or Saturday - Sunday, 9 AM - 6 PM (EST). Live representatives are here to: Help you find out if [name] Christensen is covered by your insurance plan  Determine if you are eligible for assistance paying for [facility]

## 2018-05-12 NOTE — Assessment & Plan Note (Signed)
Losartan, Atenolol 

## 2018-05-12 NOTE — Assessment & Plan Note (Addendum)
Eliquis BNP was elevated - Rx per Dr [name]

## 2018-05-12 NOTE — Assessment & Plan Note (Signed)
resolved 

## 2018-05-16 ENCOUNTER — Other Ambulatory Visit: Payer: Self-pay | Admitting: Internal Medicine

## 2018-05-20 ENCOUNTER — Encounter: Payer: Self-pay | Admitting: Internal Medicine

## 2018-05-20 ENCOUNTER — Other Ambulatory Visit (INDEPENDENT_AMBULATORY_CARE_PROVIDER_SITE_OTHER): Payer: PPO

## 2018-05-20 ENCOUNTER — Ambulatory Visit (INDEPENDENT_AMBULATORY_CARE_PROVIDER_SITE_OTHER): Payer: PPO | Admitting: Internal Medicine

## 2018-05-20 DIAGNOSIS — R319 Hematuria, unspecified: Secondary | ICD-10-CM

## 2018-05-20 DIAGNOSIS — I4892 Unspecified atrial flutter: Secondary | ICD-10-CM

## 2018-05-20 LAB — URINALYSIS
Bilirubin Urine: NEGATIVE
Ketones, ur: NEGATIVE
Leukocytes, UA: NEGATIVE
Nitrite: NEGATIVE
Specific Gravity, Urine: 1.01 (ref 1.000–1.030)
Total Protein, Urine: NEGATIVE
Urine Glucose: NEGATIVE
Urobilinogen, UA: 0.2 (ref 0.0–1.0)
pH: 6 (ref 5.0–8.0)

## 2018-05-20 NOTE — Progress Notes (Signed)
Subjective:  Patient ID: [name] Christensen, male    DOB: 1932/03/05  Age: 84 y.o. MRN: [id number]  CC: No chief complaint on file.   HPI [name] Christensen presents for blood in the urine x 3. H/o UTI  Outpatient Medications Prior to Visit  Medication Sig Dispense Refill  . acetaminophen (TYLENOL) 325 MG tablet Take 325 mg by mouth 3 (three) times daily as needed for mild pain.    [name] alfuzosin (UROXATRAL) 10 MG 24 hr tablet Take 10 mg by mouth every evening.     [name] apixaban (ELIQUIS) 5 MG TABS tablet Take 1 tablet (5 mg total) by mouth 2 (two) times daily. 60 tablet 3  . aspirin EC 81 MG tablet Take 81 mg by mouth every evening.    [name] atenolol (TENORMIN) 50 MG tablet TAKE 1 TABLET BY MOUTH ONCE A DAY 90 tablet 0  . atorvastatin (LIPITOR) 20 MG tablet TAKE 1 TABLET BY MOUTH ONCE A DAY AT 6PM 90 tablet 1  . betamethasone dipropionate (DIPROLENE) 0.05 % cream Apply 1 application topically daily as needed.    . Cholecalciferol (VITAMIN D3) 400 units CAPS Take 400 Units by mouth daily.    . diazepam (VALIUM) 5 MG tablet Take 0.5-1 tablets (2.5-5 mg total) by mouth every 12 (twelve) hours as needed for anxiety. 30 tablet 2  . dutasteride (AVODART) 0.5 MG capsule Take 0.5 mg by mouth every evening.     . furosemide (LASIX) 20 MG tablet Take 20 mg by mouth daily as needed.    . hydrocortisone (ANUSOL-HC) 2.5 % rectal cream bid (Patient taking differently: Place 1 application rectally 2 (two) times daily as needed for hemorrhoids or anal itching. ) 30 g 1  . linaclotide (LINZESS) 290 MCG CAPS capsule Take 1 capsule (290 mcg total) by mouth as needed. (Patient taking differently: Take 290 mcg by mouth daily as needed (for constipation). ) 30 capsule 11  . losartan (COZAAR) 50 MG tablet Take 0.5 tablets (25 mg total) by mouth daily. (Patient taking differently: Take 25 mg by mouth every evening. ) 90 tablet 1  . metoprolol succinate (TOPROL-XL) 25 MG 24 hr tablet Take 1 tablet (25 mg total) by mouth daily. 90  tablet 3  . Multiple Vitamin (MULTIVITAMIN WITH MINERALS) TABS Take 1 tablet by mouth daily.    . nitroGLYCERIN (NITROSTAT) 0.4 MG SL tablet PLACE ONE TABLET UNDER TONGUE EVERY 5 MINUTES AS NEEDED FOR CHEST PAIN (3 DOSES MAX) 25 tablet 0  . Polyethyl Glycol-Propyl Glycol (SYSTANE OP) Place 1 drop into both eyes daily as needed (for dry eyes).    . vitamin B-12 (CYANOCOBALAMIN) 500 MCG tablet Take 500 mcg by mouth daily.      No facility-administered medications prior to visit.     ROS: Review of Systems  Constitutional: Negative for appetite change, chills, fatigue, fever and unexpected weight change.  HENT: Negative for congestion, nosebleeds, sneezing, sore throat and trouble swallowing.   Eyes: Negative for itching and visual disturbance.  Respiratory: Negative for cough.   Cardiovascular: Negative for chest pain, palpitations and leg swelling.  Gastrointestinal: Negative for abdominal distention, blood in stool, diarrhea and nausea.  Genitourinary: Positive for hematuria. Negative for frequency.  Musculoskeletal: Negative for back pain, gait problem, joint swelling and neck pain.  Skin: Negative for rash.  Neurological: Negative for dizziness, tremors, speech difficulty and weakness.  Psychiatric/Behavioral: Negative for agitation, dysphoric mood and sleep disturbance. The patient is not nervous/anxious.     Objective:  BP 114/66 (BP Location: Left Arm, Patient Position: Sitting, Cuff Size: Normal)   Pulse 78   Temp 97.8 F (36.6 C) (Oral)   Ht 5\' 7"  (1.702 m)   Wt 146 lb (66.2 kg)   SpO2 98%   BMI 22.87 kg/m   BP Readings from Last 3 Encounters:  05/20/18 114/66  05/12/18 122/74  04/10/18 126/82    Wt Readings from Last 3 Encounters:  05/20/18 146 lb (66.2 kg)  05/12/18 147 lb (66.7 kg)  04/10/18 146 lb (66.2 kg)    Physical Exam  Constitutional: He is oriented to person, place, and time. He appears well-developed. No distress.  NAD  HENT:  Mouth/Throat:  Oropharynx is clear and moist.  Eyes: Pupils are equal, round, and reactive to light. Conjunctivae are normal.  Neck: Normal range of motion. No JVD present. No thyromegaly present.  Cardiovascular: Normal rate, regular rhythm, normal heart sounds and intact distal pulses. Exam reveals no gallop and no friction rub.  No murmur heard. Pulmonary/Chest: Effort normal and breath sounds normal. No respiratory distress. He has no wheezes. He has no rales. He exhibits no tenderness.  Abdominal: Soft. Bowel sounds are normal. He exhibits no distension and no mass. There is no tenderness. There is no rebound and no guarding.  Musculoskeletal: Normal range of motion. He exhibits no edema or tenderness.  Lymphadenopathy:    He has no cervical adenopathy.  Neurological: He is alert and oriented to person, place, and time. He has normal reflexes. No cranial nerve deficit. He exhibits normal muscle tone. He displays a negative Romberg sign. Coordination and gait normal.  Skin: Skin is warm and dry. No rash noted.  Psychiatric: He has a normal mood and affect. His behavior is normal. Judgment and thought content normal.    Lab Results  Component Value Date   WBC 5.0 03/09/2018   HGB 11.7 (L) 03/09/2018   HCT 35.1 (L) 03/09/2018   PLT 130 (L) 03/09/2018   GLUCOSE 101 (H) 03/16/2018   CHOL 118 09/11/2016   TRIG 62.0 09/11/2016   HDL 49.60 09/11/2016   LDLCALC 56 09/11/2016   ALT 14 01/29/2018   AST 20 01/29/2018   NA 136 03/16/2018   K 4.4 03/16/2018   CL 98 03/16/2018   CREATININE 1.10 03/16/2018   BUN 21 03/16/2018   CO2 24 03/16/2018   TSH 1.57 01/29/2018   PSA 12.45 (H) 02/06/2016   INR 1.1 04/02/2017   HGBA1C 6.4 10/10/2017    No results found.  Assessment & Plan:   There are no diagnoses linked to this encounter.   No orders of the defined types were placed in this encounter.    Follow-up: No follow-ups on file.  Walker Kehr, MD

## 2018-05-20 NOTE — Assessment & Plan Note (Signed)
Consider switching to Eliquis 2.5 mg bid due to hematuria

## 2018-05-20 NOTE — Assessment & Plan Note (Addendum)
UA Consider switching to Eliquis 2.5 mg bid  2018 abd [place]: Right Kidney: Length: 11.2 cm. Mildly more echogenic than the liver (image 68) but relatively maintained cortical thickness. No hydronephrosis or right renal mass.  Left Kidney: Length: 11.6 cm. Chronic extrarenal pelvis (normal variant). Echogenicity appears similar to the right kidney. No left hydronephrosis or left renal mass.

## 2018-05-26 ENCOUNTER — Encounter (INDEPENDENT_AMBULATORY_CARE_PROVIDER_SITE_OTHER): Payer: Self-pay

## 2018-05-26 ENCOUNTER — Other Ambulatory Visit: Payer: Self-pay | Admitting: Internal Medicine

## 2018-05-26 MED ORDER — APIXABAN 2.5 MG PO TABS
2.5000 mg | ORAL_TABLET | Freq: Two times a day (BID) | ORAL | 11 refills | Status: DC
Start: 2018-05-26 — End: 2018-06-16

## 2018-06-16 ENCOUNTER — Ambulatory Visit (INDEPENDENT_AMBULATORY_CARE_PROVIDER_SITE_OTHER): Payer: PPO | Admitting: Internal Medicine

## 2018-06-16 ENCOUNTER — Encounter: Payer: Self-pay | Admitting: Internal Medicine

## 2018-06-16 VITALS — BP 120/62 | HR 72 | Ht 67.0 in | Wt 146.2 lb

## 2018-06-16 DIAGNOSIS — Z95810 Presence of automatic (implantable) cardiac defibrillator: Secondary | ICD-10-CM

## 2018-06-16 DIAGNOSIS — I4510 Unspecified right bundle-branch block: Secondary | ICD-10-CM | POA: Diagnosis not present

## 2018-06-16 DIAGNOSIS — I255 Ischemic cardiomyopathy: Secondary | ICD-10-CM | POA: Diagnosis not present

## 2018-06-16 DIAGNOSIS — I481 Persistent atrial fibrillation: Secondary | ICD-10-CM | POA: Diagnosis not present

## 2018-06-16 DIAGNOSIS — I4819 Other persistent atrial fibrillation: Secondary | ICD-10-CM

## 2018-06-16 DIAGNOSIS — I5022 Chronic systolic (congestive) heart failure: Secondary | ICD-10-CM | POA: Diagnosis not present

## 2018-06-16 MED ORDER — APIXABAN 5 MG PO TABS: 5.0000 mg | ORAL_TABLET | Freq: Two times a day (BID) | ORAL | 11 refills | Status: DC

## 2018-06-16 MED ORDER — AMIODARONE HCL 400 MG PO TABS: ORAL_TABLET | ORAL | 0 refills | Status: DC

## 2018-06-16 NOTE — Progress Notes (Signed)
2015     Patient Care Team: Plotnikov, Georgina Quint, MD as PCP - General (Internal Medicine) Wendall Stade, MD as PCP - Cardiology (Cardiology) Duke Salvia, MD as PCP - Electrophysiology (Cardiology) Lindell Noe Lehman Prom., MD (Urology) [name], MD as Consulting Physician (General Surgery) Pyrtle, Carie Caddy, MD as Consulting Physician (Gastroenterology)   HPI  [name] Christensen is a 84 y.o. male Seen follow up for ICD implantation-single chamber undertaken for aborted cardiac arrest in the setting of ischemic heart disease with prior bypass surgery. He also has a history of frequent PVCs  Catheterization 12/13 demonstrating patent grafts with an atretic LIMA; LV function was normal S/p CABG    DATE TEST PVC  1/15 Holter 13%  1/18 Holter 31%     DATE TEST EF   12/13 Cath   Patent Grafts  2/15 Echo   45-50 %   12/17 Echo  EF 50-55   4/19 Echo 20-25%     Date Cr K Hgb  5/18 0.97  12.2  7/18 1.31  10.5  12.18  1.00 4.7 12.2  4/19 1.1 4.4 11.7   2/19 device interrogation had demonstrated recurrent episodes of "self terminating atrial fibrillation."  His device was reprogrammed to DDIR to avoid tracking  Looking back at remote monitoring, rather than having self terminating atrial arrhythmias it looks like there was undersensing because of blanking. 4/19 saw STW with symptoms of dyspnea and fatigue with minimal activity.  No nocturnal dyspnea.  Positive edema. He responded in part to diuretics.  We then until cardioversion and with this has been significant interval functional improvement.  He has had interval hematuria.  He thinks himself that he may have passed a small stone because his urine stream has improved significantly.  In the context of this his Eliquis was decreased from 5--2.5   Currently without edema.  Dyspnea is minimal.    Past Medical History:  Diagnosis Date  . AICD (automatic cardioverter/defibrillator) present 04/07/2017  . Allergy   . Anxiety    . BPH (benign prostatic hypertrophy)    elevated PSA Dr. Lindell Noe Bx 2010  . Cardiac arrest [facility])    a. out-of-hospital arrest 11/19/2012 - EF 40-45%, patent grafts on cath, received St. Jude AICD.  [name] Carotid disease, bilateral (HCC)    a. 0-39% by doppers.  . Cataract    bil cataracts removed  . CHF (congestive heart failure) (HCC)   . Coronary artery disease    a. s/p MI/CABG 2005. b. s/p cath at time of VF arrest 10/2013 - grafts patent.  . Cough   . Diverticulosis   . Elevated LFTs    a. 10/2012 felt due to cardiac arrest - Hepatitis C Ab reactive from 10/27/2012>>Hep C RNA PCR negative 11/17/2012.   [name] GERD (gastroesophageal reflux disease)   . Hyperlipidemia   . Hypertension   . Internal hemorrhoids   . Myocardial infarction (HCC)    2005 - CABG x 5 2005  . RBBB   . Tubular adenoma of colon   . Ventricular bigeminy    a. Event monitor 01/2013: NSR with PVCs and occ bigeminy.    Past Surgical History:  Procedure Laterality Date  . APPENDECTOMY    . CARDIAC CATHETERIZATION  2007   with patent graft anatomy atretic left internal mammary  artery to the LAD which is nonobstructive. Will restart study June 08, 2007  . CARDIOVERSION N/A 03/27/2018   Procedure: CARDIOVERSION;  Surgeon: Iran Ouch, MD;  Location: [facility] ORS;  Service: Cardiovascular;  Laterality: N/A;  . CATARACT EXTRACTION W/PHACO Right 04/23/2016   Procedure: CATARACT EXTRACTION PHACO AND INTRAOCULAR LENS PLACEMENT (IOC);  Surgeon: [name], MD;  Location: [facility] ORS;  Service: Ophthalmology;  Laterality: Right;  [place] 1.09 AP% 19.3 CDE 13.38 Fluid pack lot # [id number]  . CHOLECYSTECTOMY N/A 06/05/2017   Procedure: LAPAROSCOPIC CHOLECYSTECTOMY WITH INTRAOPERATIVE CHOLANGIOGRAM ERAS PATHWAY POSSIBLE NEEDLE CORE BIOPSY OF LIVER;  Surgeon: [name], MD;  Location: [facility] OR;  Service: General;  Laterality: N/A;  ERAS PATHWAY  . COLONOSCOPY    . CORONARY ARTERY BYPASS GRAFT  2005  . EYE SURGERY    . ICD  GENERATOR CHANGEOUT N/A 04/07/2017   Procedure: ICD Generator Changeout;  Surgeon: Duke Salvia, MD;  Location: [facility] INVASIVE CV LAB;  Service: Cardiovascular;  Laterality: N/A;  . IMPLANTABLE CARDIOVERTER DEFIBRILLATOR IMPLANT N/A 10/26/2012   STJ single chamber ICD implanted by Dr Graciela Husbands for cardiac arrest   . INGUINAL HERNIA REPAIR Bilateral 01/17/2015   Procedure: BILATERAL LAPAROSCOPIC INGUINAL HERNIA REPAIR WITH LEFT FEMORAL HERNIA REPAIR;  Surgeon: [name], MD;  Location: [facility] OR;  Service: General;  Laterality: Bilateral;  . INSERTION OF MESH Bilateral 01/17/2015   Procedure: INSERTION OF MESH;  Surgeon: [name], MD;  Location: [facility] OR;  Service: General;  Laterality: Bilateral;  . LAPAROSCOPIC CHOLECYSTECTOMY  06/05/2017  . LEAD INSERTION N/A 04/07/2017   Procedure: RA Lead Insertion;  Surgeon: Duke Salvia, MD;  Location: [facility] INVASIVE CV LAB;  Service: Cardiovascular;  Laterality: N/A;  . LEAD REVISION/REPAIR N/A 04/08/2017   Procedure: Atrial Lead Revision/Repair;  Surgeon: Duke Salvia, MD;  Location: [facility] INVASIVE CV LAB;  Service: Cardiovascular;  Laterality: N/A;  . LEFT HEART CATHETERIZATION WITH CORONARY/GRAFT ANGIOGRAM N/A 11/16/2012   Procedure: LEFT HEART CATHETERIZATION WITH Isabel Caprice;  Surgeon: [name], MD;  Location: [facility] CATH LAB;  Service: Cardiovascular;  Laterality: N/A;  . LIVER BIOPSY N/A 06/05/2017   Procedure: SINGLE SITE LIVER BIOPSY ERAS PATHWAY;  Surgeon: [name], MD;  Location: [facility] OR;  Service: General;  Laterality: N/A;  ERAS PATHWAY  . LUMBAR FUSION  09/2007    Current Outpatient Medications  Medication Sig Dispense Refill  . acetaminophen (TYLENOL) 325 MG tablet Take 325 mg by mouth 3 (three) times daily as needed for mild pain.    [name] alfuzosin (UROXATRAL) 10 MG 24 hr tablet Take 10 mg by mouth every evening.     [name] apixaban (ELIQUIS) 2.5 MG TABS tablet Take 1 tablet (2.5 mg total) by mouth 2 (two) times daily. 60 tablet 11  .  aspirin EC 81 MG tablet Take 81 mg by mouth every evening.    [name] atenolol (TENORMIN) 50 MG tablet TAKE 1 TABLET BY MOUTH ONCE A DAY 90 tablet 0  . atorvastatin (LIPITOR) 20 MG tablet TAKE 1 TABLET BY MOUTH ONCE A DAY AT 6PM 90 tablet 1  . betamethasone dipropionate (DIPROLENE) 0.05 % cream Apply 1 application topically daily as needed.    . Cholecalciferol (VITAMIN D3) 400 units CAPS Take 400 Units by mouth daily.    . diazepam (VALIUM) 5 MG tablet Take 0.5-1 tablets (2.5-5 mg total) by mouth every 12 (twelve) hours as needed for anxiety. 30 tablet 2  . dutasteride (AVODART) 0.5 MG capsule Take 0.5 mg by mouth every evening.     . furosemide (LASIX) 20 MG tablet Take 20 mg by mouth daily as needed.    . hydrocortisone (ANUSOL-HC) 2.5 %  rectal cream bid (Patient taking differently: Place 1 application rectally 2 (two) times daily as needed for hemorrhoids or anal itching. ) 30 g 1  . linaclotide (LINZESS) 290 MCG CAPS capsule Take 1 capsule (290 mcg total) by mouth as needed. (Patient taking differently: Take 290 mcg by mouth daily as needed (for constipation). ) 30 capsule 11  . losartan (COZAAR) 50 MG tablet Take 0.5 tablets (25 mg total) by mouth daily. (Patient taking differently: Take 25 mg by mouth every evening. ) 90 tablet 1  . metoprolol succinate (TOPROL-XL) 25 MG 24 hr tablet Take 1 tablet (25 mg total) by mouth daily. 90 tablet 3  . Multiple Vitamin (MULTIVITAMIN WITH MINERALS) TABS Take 1 tablet by mouth daily.    . nitroGLYCERIN (NITROSTAT) 0.4 MG SL tablet PLACE ONE TABLET UNDER TONGUE EVERY 5 MINUTES AS NEEDED FOR CHEST PAIN (3 DOSES MAX) 25 tablet 0  . Polyethyl Glycol-Propyl Glycol (SYSTANE OP) Place 1 drop into both eyes daily as needed (for dry eyes).    . vitamin B-12 (CYANOCOBALAMIN) 500 MCG tablet Take 500 mcg by mouth daily.      No current facility-administered medications for this visit.     Allergies  Allergen Reactions  . Ezetimibe Other (See Comments)    Pt was in  coma for 6 days   numbness  . Penicillins Other (See Comments)    BLISTERS BETWEEN FINGERS  Has patient had a PCN reaction causing immediate rash, facial/tongue/throat swelling, SOB or lightheadedness with hypotension: unknown Has patient had a PCN reaction causing severe rash involving mucus membranes or skin necrosis: unknown Has patient had a PCN reaction that required hospitalization no Has patient had a PCN reaction occurring within the last 10 years: no If all of the above answers are "NO", then may proceed with Cephalosporin use.   . Pravastatin Sodium Other (See Comments)    Aches and pains in joints  . Rosuvastatin Other (See Comments)    MYALGIAS  . Niacin Anxiety and Other (See Comments)    "Makes me feel nervous, jittery"    Review of Systems negative except from HPI and PMH  Physical Exam BP 120/62 (BP Location: Left Arm, Patient Position: Sitting, Cuff Size: Normal)   Pulse 72   Ht 5\' 7"  (1.702 m)   Wt 146 lb 4 oz (66.3 kg)   BMI 22.91 kg/m  Well developed and nourished in no acute distress HENT normal Neck supple with JVP-flat Clear Regular rate and rhythm, no murmurs or gallops Abd-soft with active BS No Clubbing cyanosis edema Skin-warm and dry A & Oriented  Grossly normal sensory and motor function      ECG AV pacing with frequent PVCs (25%)  Assessment and  Plan  Atrial fibrillation/flutter-persistent  PVCs-right bundle branch superior axis    Cardiomyopathy recurrent  History of aborted sudden cardiac death    Implantable defibrillator-St. Jude   upgraded single-dual-chamber May 2018  Ischemic cardiomyopathy with prior CABG    Congestive Heart Failure , chronic systolic  Right bundle branch block.  Sinus bradycardia/chronotropic incompetence  Hypotension   Functionally he is much improved following cardioversion.  Knowing how much she was ventricularly pacing, rate is unlikely the cause of his cardiomyopathy; this leads [place] with  PVCs as well as ventricular pacing is potential culprits.  We discussed device upgrade versus amiodarone (as an option) for antiarrhythmic suppression of his PVCs as well as possibly his atrial fibrillation.  We reviewed side effects and he is agreeable to  proceeding with amiodarone.  We will plan to recheck a transmission in about 4 weeks to look for suppression (ventricular pacing percentage)  If we have accomplished this, we will plan at 3 months to repeat an echocardiogram.  The role of upgrade is held in abeyance.  We will stop his aspirin.  We will resume his Eliquis at 5 mg twice daily.  More than 50% of 25 min was spent in counseling related to the above

## 2018-06-16 NOTE — Patient Instructions (Addendum)
Medication Instructions: - Your physician has recommended you make the following change in your medication:   1) STOP aspirin  2) INCREASE eliquis to 5 mg- take 1 tablet by mouth twice daily  3) START amiodarone 200 mg- - take 1 tablets (400 mg) by mouth TWICE daily x 2 weeks, then - take 1 tablets (400 mg) by mouth ONCE daily x 2 weeks, then - take 1 tablet (200 mg) by mouth ONCE daily going forward  Labwork: - none ordered  Procedures/Testing: - Your physician has requested that you have an echocardiogram- in 3 months. Echocardiography is a painless test that uses sound waves to create images of your heart. It provides your doctor with information about the size and shape of your heart and how well your heart's chambers and valves are working. This procedure takes approximately one hour. There are no restrictions for this procedure.  Follow-Up: - Remote monitoring is used to monitor your Pacemaker of ICD from home. This monitoring reduces the number of office visits required to check your device to one time per year. It allows [place] to keep an eye on the functioning of your device to ensure it is working properly. You are scheduled for a device check from home on 07/07/18. You may send your transmission at any time that day. If you have a wireless device, the transmission will be sent automatically. After your physician reviews your transmission, you will receive a postcard with your next transmission date.  - Your physician wants you to follow-up in: 3 months (just after your echo) with Dr. [name]. You will receive a reminder letter in the mail two months in advance. If you don't receive a letter, please call our office to schedule the follow-up appointment.     Any Additional Special Instructions Will Be Listed Below (If Applicable).     If you need a refill on your cardiac medications before your next appointment, please call your pharmacy.

## 2018-06-17 LAB — CUP PACEART INCLINIC DEVICE CHECK
Battery Remaining Longevity: 62 mo
Brady Statistic RA Percent Paced: 87 %
Brady Statistic RV Percent Paced: 77 %
Date Time Interrogation Session: 20190723133525
HighPow Impedance: 67.5 Ohm
Implantable Lead Implant Date: 20131218
Implantable Lead Implant Date: 20180514
Implantable Lead Location: 753859
Implantable Lead Location: 753860
Implantable Lead Model: 5076
Implantable Pulse Generator Implant Date: 20180514
Lead Channel Impedance Value: 450 Ohm
Lead Channel Impedance Value: 487.5 Ohm
Lead Channel Pacing Threshold Amplitude: 0.75 V
Lead Channel Pacing Threshold Amplitude: 1.25 V
Lead Channel Pacing Threshold Pulse Width: 0.5 ms
Lead Channel Pacing Threshold Pulse Width: 0.5 ms
Lead Channel Sensing Intrinsic Amplitude: 3.1 mV
Lead Channel Sensing Intrinsic Amplitude: 6.1 mV
Lead Channel Setting Pacing Amplitude: 2 V
Lead Channel Setting Pacing Amplitude: 2.5 V
Lead Channel Setting Pacing Pulse Width: 0.5 ms
Lead Channel Setting Sensing Sensitivity: 0.5 mV
Pulse Gen Serial Number: 1245762

## 2018-06-23 ENCOUNTER — Encounter: Payer: Self-pay | Admitting: Internal Medicine

## 2018-06-24 ENCOUNTER — Telehealth: Payer: Self-pay | Admitting: Internal Medicine

## 2018-06-24 NOTE — Telephone Encounter (Signed)
[name] Lemmings, RN requested that [name] Christensen send a transmission to review episodes. He will go home early today to send it around 11am. I advised him that I would be looking for it and forward the information to [facility] and Dr. [name], he is appreciative.

## 2018-06-24 NOTE — Telephone Encounter (Signed)
Transmission received 06/24/18 10:31am. ApVp presenting rhythm. No episodes. Normal device function.

## 2018-06-24 NOTE — Telephone Encounter (Signed)
New message    1. Has your device fired? NO  2. Is you device beeping? NO  3. Are you experiencing draining or swelling at device site? NO  4. Are you calling to see if we received your device transmission? YES  5. Have you passed out? NO  Patient wants to know if he still needs to send a transmission, per MyChart message from 06/23/18. Please advise   Please route to [place]

## 2018-06-25 ENCOUNTER — Encounter (INDEPENDENT_AMBULATORY_CARE_PROVIDER_SITE_OTHER): Payer: Self-pay

## 2018-06-25 NOTE — Telephone Encounter (Signed)
MyChart message sent to the patient to follow up on what his transmission showed and see if he cut the amiodarone down to 200 mg BID.

## 2018-06-26 ENCOUNTER — Telehealth: Payer: Self-pay | Admitting: Internal Medicine

## 2018-06-26 ENCOUNTER — Telehealth: Payer: Self-pay | Admitting: Cardiovascular Disease

## 2018-06-26 NOTE — Telephone Encounter (Signed)
I spoke with Dr. [name] regarding the patient's ongoing symptoms of weakness, difficulty sleeping, need for an additional pillow, poor appetite, and bloating - per MyChart messages. We asked the patient to lower his amiodarone loading dose from 400 mg BID to 200 mg BID on 7/31 per Dr. [name].  Today, Dr. [name] recommends: 1) STOP amiodarone 2) Take lasix (furosemide) 20 mg- take 2 tablets (40 mg) by mouth once daily through Monday- 1st dose tonight.  3) call and follow up with the patient on Monday to review with Dr. [name].  I have notified the patient of the Dr. Olin Pia recommendations and he voices understanding and is agreeable.  I will follow up with him on Monday.

## 2018-06-26 NOTE — Telephone Encounter (Signed)
New message   Pt states his computer is down. He can't read the messages on MyChart.  Patient would like for you to contact him by phone.

## 2018-06-26 NOTE — Telephone Encounter (Signed)
Separate encounter open prior to seeing this message. I had already called the patient to follow up.

## 2018-06-29 NOTE — Telephone Encounter (Signed)
I spoke with the patient to follow up on how he is feeling today. He took extra lasix through the weekend. He states he felt good Saturday and great on Sunday. Sunday night, he started to feel like he was bloated again.  This morning he didn't feel as bloated. He states he has been at "the office" today and feels weak. He thinks he needs to go home and just rest.  He is aware I will review with Dr. [name], but he states he would like to be called back tomorrow.   He states his weights have been stable.

## 2018-06-30 NOTE — Telephone Encounter (Signed)
Dr [name] advises for patient to take Furosemide 80 mg once a day for 2 days and remain on that higher dose if he gets better and have him follow up in the office to see him this next Tuesday.  S/w patient. He said his stomach is less bloated, however, his legs are still very swollen. He verbalized understanding to take furosemide 80 mg once a day for 2 days and stay on higher dose if it was working. He agreed to come in for appointment on 07/07/18 at 8:45 am with Dr [name]. He would like [place] to call him Thursday for an update. I encouraged patient to call us if possible as well. Routing to [place] to make her aware.

## 2018-07-02 NOTE — Telephone Encounter (Signed)
I spoke with the patient. He states his bloating is better on lasix 40 mg BID.  He is just tired. I have advised him to continue lasix 40 mg BID and keep his follow up with Dr. [name] on Tuesday 8/13 as scheduled.  The patient is agreeable and voices understanding.

## 2018-07-03 DIAGNOSIS — R972 Elevated prostate specific antigen [PSA]: Secondary | ICD-10-CM | POA: Diagnosis not present

## 2018-07-03 DIAGNOSIS — N401 Enlarged prostate with lower urinary tract symptoms: Secondary | ICD-10-CM | POA: Diagnosis not present

## 2018-07-06 NOTE — Progress Notes (Signed)
2015     Patient Care Team: Plotnikov, Georgina Quint, MD as PCP - General (Internal Medicine) Wendall Stade, MD as PCP - Cardiology (Cardiology) Duke Salvia, MD as PCP - Electrophysiology (Cardiology) Lindell Noe Lehman Prom., MD (Urology) [name], MD as Consulting Physician (General Surgery) Pyrtle, Carie Caddy, MD as Consulting Physician (Gastroenterology)   HPI  [name] Christensen is a 84 y.o. male Seen follow up for ICD implantation-single chamber undertaken for aborted cardiac arrest in the setting of ischemic heart disease with prior bypass surgery. He also has a history of frequent PVCs  Catheterization 12/13 demonstrating patent grafts with an atretic LIMA; LV function was normal S/p CABG    DATE TEST PVC  1/15 Holter 13%  1/18 Holter 31%     DATE TEST EF   12/13 Cath   Patent Grafts  2/15 Echo   45-50 %   12/17 Echo  EF 50-55   4/19 Echo 20-25%     Date Cr K Hgb  5/18 0.97  12.2  7/18 1.31  10.5  12.18  1.00 4.7 12.2  4/19 1.1 4.4 11.7   2/19 device interrogation had demonstrated recurrent episodes of "self terminating atrial fibrillation."  His device was reprogrammed to DDIR to avoid tracking  PVC burden prompted amio for suppression; also for Afib for which he underwent DCCV 5/19  He was intolerant and amio was discontinued.  Also edematous and diuretics increased as outpt  His corvue was elevataed and he has responded clinically from lasix 20 bid>>40 bid  Now without sob or edema  Missed am dose yday didn't feel quite right but better after he took it   Some lightheadedness with standing particularly at night and in the morning  Past Medical History:  Diagnosis Date  . AICD (automatic cardioverter/defibrillator) present 04/07/2017  . Allergy   . Anxiety   . BPH (benign prostatic hypertrophy)    elevated PSA Dr. Lindell Noe Bx 2010  . Cardiac arrest [facility])    a. out-of-hospital arrest 11/14/2012 - EF 40-45%, patent grafts on cath, received St. Jude  AICD.  [name] Carotid disease, bilateral (HCC)    a. 0-39% by doppers.  . Cataract    bil cataracts removed  . CHF (congestive heart failure) (HCC)   . Coronary artery disease    a. s/p MI/CABG 2005. b. s/p cath at time of VF arrest 10/2013 - grafts patent.  . Cough   . Diverticulosis   . Elevated LFTs    a. 10/2012 felt due to cardiac arrest - Hepatitis C Ab reactive from 10/30/2012>>Hep C RNA PCR negative 11/21/2012.   [name] GERD (gastroesophageal reflux disease)   . Hyperlipidemia   . Hypertension   . Internal hemorrhoids   . Myocardial infarction (HCC)    2005 - CABG x 5 2005  . RBBB   . Tubular adenoma of colon   . Ventricular bigeminy    a. Event monitor 01/2013: NSR with PVCs and occ bigeminy.    Past Surgical History:  Procedure Laterality Date  . APPENDECTOMY    . CARDIAC CATHETERIZATION  2007   with patent graft anatomy atretic left internal mammary  artery to the LAD which is nonobstructive. Will restart study June 08, 2007  . CARDIOVERSION N/A 03/27/2018   Procedure: CARDIOVERSION;  Surgeon: Iran Ouch, MD;  Location: [facility] ORS;  Service: Cardiovascular;  Laterality: N/A;  . CATARACT EXTRACTION W/PHACO Right 04/23/2016   Procedure: CATARACT EXTRACTION PHACO AND INTRAOCULAR LENS PLACEMENT (IOC);  Surgeon: [name], MD;  Location: [facility] ORS;  Service: Ophthalmology;  Laterality: Right;  [place] 1.09 AP% 19.3 CDE 13.38 Fluid pack lot # [id number]  . CHOLECYSTECTOMY N/A 06/05/2017   Procedure: LAPAROSCOPIC CHOLECYSTECTOMY WITH INTRAOPERATIVE CHOLANGIOGRAM ERAS PATHWAY POSSIBLE NEEDLE CORE BIOPSY OF LIVER;  Surgeon: [name], MD;  Location: [facility] OR;  Service: General;  Laterality: N/A;  ERAS PATHWAY  . COLONOSCOPY    . CORONARY ARTERY BYPASS GRAFT  2005  . EYE SURGERY    . ICD GENERATOR CHANGEOUT N/A 04/07/2017   Procedure: ICD Generator Changeout;  Surgeon: Duke Salvia, MD;  Location: [facility] INVASIVE CV LAB;  Service: Cardiovascular;  Laterality: N/A;  . IMPLANTABLE  CARDIOVERTER DEFIBRILLATOR IMPLANT N/A 11/17/2012   STJ single chamber ICD implanted by Dr Graciela Husbands for cardiac arrest   . INGUINAL HERNIA REPAIR Bilateral 01/17/2015   Procedure: BILATERAL LAPAROSCOPIC INGUINAL HERNIA REPAIR WITH LEFT FEMORAL HERNIA REPAIR;  Surgeon: [name], MD;  Location: [facility] OR;  Service: General;  Laterality: Bilateral;  . INSERTION OF MESH Bilateral 01/17/2015   Procedure: INSERTION OF MESH;  Surgeon: [name], MD;  Location: [facility] OR;  Service: General;  Laterality: Bilateral;  . LAPAROSCOPIC CHOLECYSTECTOMY  06/05/2017  . LEAD INSERTION N/A 04/07/2017   Procedure: RA Lead Insertion;  Surgeon: Duke Salvia, MD;  Location: [facility] INVASIVE CV LAB;  Service: Cardiovascular;  Laterality: N/A;  . LEAD REVISION/REPAIR N/A 04/08/2017   Procedure: Atrial Lead Revision/Repair;  Surgeon: Duke Salvia, MD;  Location: [facility] INVASIVE CV LAB;  Service: Cardiovascular;  Laterality: N/A;  . LEFT HEART CATHETERIZATION WITH CORONARY/GRAFT ANGIOGRAM N/A 11/19/2012   Procedure: LEFT HEART CATHETERIZATION WITH Isabel Caprice;  Surgeon: [name], MD;  Location: [facility] CATH LAB;  Service: Cardiovascular;  Laterality: N/A;  . LIVER BIOPSY N/A 06/05/2017   Procedure: SINGLE SITE LIVER BIOPSY ERAS PATHWAY;  Surgeon: [name], MD;  Location: [facility] OR;  Service: General;  Laterality: N/A;  ERAS PATHWAY  . LUMBAR FUSION  09/2007    Current Outpatient Medications  Medication Sig Dispense Refill  . acetaminophen (TYLENOL) 325 MG tablet Take 325 mg by mouth 3 (three) times daily as needed for mild pain.    [name] alfuzosin (UROXATRAL) 10 MG 24 hr tablet Take 10 mg by mouth every evening.     [name] apixaban (ELIQUIS) 5 MG TABS tablet Take 1 tablet (5 mg total) by mouth 2 (two) times daily. 60 tablet 11  . atenolol (TENORMIN) 50 MG tablet TAKE 1 TABLET BY MOUTH ONCE A DAY 90 tablet 0  . atorvastatin (LIPITOR) 20 MG tablet TAKE 1 TABLET BY MOUTH ONCE A DAY AT 6PM 90 tablet 1  . betamethasone  dipropionate (DIPROLENE) 0.05 % cream Apply 1 application topically daily as needed.    . Cholecalciferol (VITAMIN D3) 400 units CAPS Take 400 Units by mouth daily.    . diazepam (VALIUM) 5 MG tablet Take 0.5-1 tablets (2.5-5 mg total) by mouth every 12 (twelve) hours as needed for anxiety. 30 tablet 2  . dutasteride (AVODART) 0.5 MG capsule Take 0.5 mg by mouth every evening.     . furosemide (LASIX) 20 MG tablet Take 2 tablets (40 mg) by mouth twice daily    . hydrocortisone (ANUSOL-HC) 2.5 % rectal cream bid (Patient taking differently: Place 1 application rectally 2 (two) times daily as needed for hemorrhoids or anal itching. ) 30 g 1  . linaclotide (LINZESS) 290 MCG CAPS capsule Take 1 capsule (290 mcg total) by mouth as needed. (  Patient taking differently: Take 290 mcg by mouth daily as needed (for constipation). ) 30 capsule 11  . losartan (COZAAR) 50 MG tablet Take 0.5 tablets (25 mg total) by mouth daily. (Patient taking differently: Take 25 mg by mouth every evening. ) 90 tablet 1  . metoprolol succinate (TOPROL-XL) 25 MG 24 hr tablet Take 1 tablet (25 mg total) by mouth daily. 90 tablet 3  . Multiple Vitamin (MULTIVITAMIN WITH MINERALS) TABS Take 1 tablet by mouth daily.    . nitroGLYCERIN (NITROSTAT) 0.4 MG SL tablet PLACE ONE TABLET UNDER TONGUE EVERY 5 MINUTES AS NEEDED FOR CHEST PAIN (3 DOSES MAX) 25 tablet 0  . Polyethyl Glycol-Propyl Glycol (SYSTANE OP) Place 1 drop into both eyes daily as needed (for dry eyes).    . vitamin B-12 (CYANOCOBALAMIN) 500 MCG tablet Take 500 mcg by mouth daily.      No current facility-administered medications for this visit.     Allergies  Allergen Reactions  . Ezetimibe Other (See Comments)    Pt was in coma for 6 days   numbness  . Penicillins Other (See Comments)    BLISTERS BETWEEN FINGERS  Has patient had a PCN reaction causing immediate rash, facial/tongue/throat swelling, SOB or lightheadedness with hypotension: unknown Has patient had a  PCN reaction causing severe rash involving mucus membranes or skin necrosis: unknown Has patient had a PCN reaction that required hospitalization no Has patient had a PCN reaction occurring within the last 10 years: no If all of the above answers are "NO", then may proceed with Cephalosporin use.   . Pravastatin Sodium Other (See Comments)    Aches and pains in joints  . Rosuvastatin Other (See Comments)    MYALGIAS  . Niacin Anxiety and Other (See Comments)    "Makes me feel nervous, jittery"    Review of Systems negative except from HPI and PMH  Physical Exam BP (!) 104/52 (BP Location: Left Arm, Patient Position: Sitting, Cuff Size: Normal)   Ht 5\' 7"  (1.702 m)   Wt 141 lb 12 oz (64.3 kg)   BMI 22.20 kg/m  Well developed and nourished in no acute distress HENT normal Neck supple with JVP-8-10 Clear Regular rate and rhythm, no murmurs or gallops Abd-soft with active BS No Clubbing cyanosis edema Skin-warm and dry A & Oriented  Grossly normal sensory and motor function      ECG  P-synchronous/ AV  pacing with freq PVC ( still about 25 %)   Assessment and  Plan  Atrial fibrillation/flutter-persistent  PVCs-right bundle branch superior axis  Not identified by device ( probable too late coupled  Cardiomyopathy recurrent  History of aborted sudden cardiac death    Implantable defibrillator-St. Jude   upgraded single-dual-chamber May 2018  Ischemic cardiomyopathy with prior CABG    Congestive Heart Failure , acute/chronic systolic  Right bundle branch block.  Sinus bradycardia/chronotropic incompetence  Hypotension   He is much improved correlating with his [place] change supporting hypothesis of acute on chronic congestive heart failure; I think retrospectively it is unlikely that amiodarone was the culprit.  It may be worth trying again as he continues to have frequent PVCs.  We will plan to review his echo in about 8 weeks and then make a decision about  CRT upgrade.  Some orthostatic lightheadedness particularly at night.  We will have him decrease his furosemide from 40 twice daily--40 daily with instructions to increase it to twice daily for a weight change of 3 pounds  Alpha blockers may also be contributing   BMET today  Clarifying MAR  Re BB  It turns out he has been taking both atenolol and metoprolol  Will stop the former We spent more than 50% of our >25 min visit in face to face counseling regarding the above

## 2018-07-07 ENCOUNTER — Ambulatory Visit (INDEPENDENT_AMBULATORY_CARE_PROVIDER_SITE_OTHER): Payer: PPO | Admitting: Internal Medicine

## 2018-07-07 ENCOUNTER — Encounter: Payer: Self-pay | Admitting: Internal Medicine

## 2018-07-07 ENCOUNTER — Ambulatory Visit (INDEPENDENT_AMBULATORY_CARE_PROVIDER_SITE_OTHER): Payer: PPO | Admitting: *Deleted

## 2018-07-07 VITALS — BP 104/52 | Ht 67.0 in | Wt 141.8 lb

## 2018-07-07 DIAGNOSIS — I255 Ischemic cardiomyopathy: Secondary | ICD-10-CM | POA: Diagnosis not present

## 2018-07-07 DIAGNOSIS — I493 Ventricular premature depolarization: Secondary | ICD-10-CM

## 2018-07-07 DIAGNOSIS — I480 Paroxysmal atrial fibrillation: Secondary | ICD-10-CM

## 2018-07-07 DIAGNOSIS — I5022 Chronic systolic (congestive) heart failure: Secondary | ICD-10-CM

## 2018-07-07 DIAGNOSIS — I469 Cardiac arrest, cause unspecified: Secondary | ICD-10-CM

## 2018-07-07 DIAGNOSIS — I4892 Unspecified atrial flutter: Secondary | ICD-10-CM | POA: Diagnosis not present

## 2018-07-07 DIAGNOSIS — Z95810 Presence of automatic (implantable) cardiac defibrillator: Secondary | ICD-10-CM

## 2018-07-07 MED ORDER — FUROSEMIDE 20 MG PO TABS: ORAL_TABLET | ORAL | 3 refills | Status: DC

## 2018-07-07 NOTE — Patient Instructions (Addendum)
Medication Instructions: - Your physician has recommended you make the following change in your medication:   1) STOP atenolol  2) CHANGE lasix (furosemide) 20 mg- take 2 tablets (40 mg) by mouth once daily, you may take an extra 2 tablets (40 mg) by mouth daily if needed for weight gain of 3 lbs or more in 24 hours   Labwork: - Your physician recommends that you have lab work today: [employer]  Procedures/Testing: - Echocardiogram as scheduled   Follow-Up: - with Dr. [name] as scheduled   Any Additional Special Instructions Will Be Listed Below (If Applicable).     If you need a refill on your cardiac medications before your next appointment, please call your pharmacy.

## 2018-07-07 NOTE — Progress Notes (Signed)
Remote ICD transmission.   

## 2018-07-08 LAB — BASIC METABOLIC PANEL
BUN/Creatinine Ratio: 12 (ref 10–24)
BUN: 16 mg/dL (ref 8–27)
CO2: 27 mmol/L (ref 20–29)
Calcium: 10.6 mg/dL — ABNORMAL HIGH (ref 8.6–10.2)
Chloride: 91 mmol/L — ABNORMAL LOW (ref 96–106)
Creatinine, Ser: 1.35 mg/dL — ABNORMAL HIGH (ref 0.76–1.27)
GFR calc Af Amer: 56 mL/min/{1.73_m2} — ABNORMAL LOW (ref >59)
GFR calc non Af Amer: 48 mL/min/{1.73_m2} — ABNORMAL LOW (ref >59)
Glucose: 88 mg/dL (ref 65–99)
Potassium: 4.3 mmol/L (ref 3.5–5.2)
Sodium: 132 mmol/L — ABNORMAL LOW (ref 134–144)

## 2018-07-16 ENCOUNTER — Telehealth: Payer: Self-pay | Admitting: Internal Medicine

## 2018-07-16 DIAGNOSIS — N289 Disorder of kidney and ureter, unspecified: Secondary | ICD-10-CM

## 2018-07-16 NOTE — Telephone Encounter (Signed)
I spoke with the patient regarding his most recent BMP results. He is aware that Dr. [name] would like to recheck his renal function in about 3 weeks.  He is agreeable with coming to the office on 08/03/18 at 8:00 am to have this done.   He does state that he has been watching his weight and has been requiring less furosemide.  He is currently taking lasix 20 mg- 1 tablet daily and feels like he is doing really well. He states that he feels good and his weights are stable.   I have advised that he may continue lasix at the current 20 mg once daily dose, but he has the extra to take if needed.  The patient is agreeable and voices understanding.

## 2018-08-02 LAB — CUP PACEART REMOTE DEVICE CHECK
Battery Remaining Longevity: 64 mo
Battery Remaining Percentage: 78 %
Battery Voltage: 2.96 V
Brady Statistic AP VP Percent: 89 %
Brady Statistic AP VS Percent: 11 %
Brady Statistic AS VP Percent: 1 %
Brady Statistic AS VS Percent: 1 %
Brady Statistic RA Percent Paced: 99 %
Brady Statistic RV Percent Paced: 89 %
Date Time Interrogation Session: 20190813112625
HighPow Impedance: 71 Ohm
HighPow Impedance: 71 Ohm
Implantable Lead Implant Date: 20131218
Implantable Lead Implant Date: 20180514
Implantable Lead Location: 753859
Implantable Lead Location: 753860
Implantable Lead Model: 5076
Implantable Pulse Generator Implant Date: 20180514
Lead Channel Impedance Value: 460 Ohm
Lead Channel Impedance Value: 490 Ohm
Lead Channel Pacing Threshold Amplitude: 0.75 V
Lead Channel Pacing Threshold Amplitude: 1.25 V
Lead Channel Pacing Threshold Pulse Width: 0.5 ms
Lead Channel Pacing Threshold Pulse Width: 0.5 ms
Lead Channel Sensing Intrinsic Amplitude: 12 mV
Lead Channel Sensing Intrinsic Amplitude: 2.7 mV
Lead Channel Setting Pacing Amplitude: 2 V
Lead Channel Setting Pacing Amplitude: 2.5 V
Lead Channel Setting Pacing Pulse Width: 0.5 ms
Lead Channel Setting Sensing Sensitivity: 0.5 mV
Pulse Gen Serial Number: 1245762

## 2018-08-03 ENCOUNTER — Other Ambulatory Visit (INDEPENDENT_AMBULATORY_CARE_PROVIDER_SITE_OTHER): Payer: PPO | Admitting: *Deleted

## 2018-08-03 DIAGNOSIS — N289 Disorder of kidney and ureter, unspecified: Secondary | ICD-10-CM

## 2018-08-04 LAB — BASIC METABOLIC PANEL
BUN/Creatinine Ratio: 11 (ref 10–24)
BUN: 13 mg/dL (ref 8–27)
CO2: 24 mmol/L (ref 20–29)
Calcium: 11.1 mg/dL — ABNORMAL HIGH (ref 8.6–10.2)
Chloride: 98 mmol/L (ref 96–106)
Creatinine, Ser: 1.16 mg/dL (ref 0.76–1.27)
GFR calc Af Amer: 67 mL/min/{1.73_m2} (ref >59)
GFR calc non Af Amer: 58 mL/min/{1.73_m2} — ABNORMAL LOW (ref >59)
Glucose: 96 mg/dL (ref 65–99)
Potassium: 4.3 mmol/L (ref 3.5–5.2)
Sodium: 135 mmol/L (ref 134–144)

## 2018-08-05 ENCOUNTER — Telehealth: Payer: Self-pay | Admitting: Internal Medicine

## 2018-08-05 NOTE — Telephone Encounter (Signed)
I spoke with the patient today regarding his most recent lab results. He wanted me to note that he has been off of his lasix for about 3 weeks now as he did not feel that he needed it. He has had no swelling and is feeling well. To Dr. [name] just an FYI per the patient.

## 2018-08-07 ENCOUNTER — Other Ambulatory Visit: Payer: Self-pay | Admitting: Internal Medicine

## 2018-08-17 ENCOUNTER — Ambulatory Visit (INDEPENDENT_AMBULATORY_CARE_PROVIDER_SITE_OTHER): Payer: PPO | Admitting: Internal Medicine

## 2018-08-17 ENCOUNTER — Ambulatory Visit (INDEPENDENT_AMBULATORY_CARE_PROVIDER_SITE_OTHER)
Admission: RE | Admit: 2018-08-17 | Discharge: 2018-08-17 | Disposition: A | Discharge status: Home / Self Care | Payer: PPO | Source: Ambulatory Visit | Attending: Internal Medicine | Admitting: Internal Medicine

## 2018-08-17 ENCOUNTER — Encounter: Payer: Self-pay | Admitting: Internal Medicine

## 2018-08-17 ENCOUNTER — Other Ambulatory Visit (INDEPENDENT_AMBULATORY_CARE_PROVIDER_SITE_OTHER): Payer: PPO

## 2018-08-17 VITALS — BP 140/80 | HR 78 | Temp 97.9°F | Ht 67.0 in | Wt 145.0 lb

## 2018-08-17 DIAGNOSIS — R51 Headache: Secondary | ICD-10-CM

## 2018-08-17 DIAGNOSIS — I10 Essential (primary) hypertension: Secondary | ICD-10-CM | POA: Diagnosis not present

## 2018-08-17 DIAGNOSIS — W19XXXA Unspecified fall, initial encounter: Secondary | ICD-10-CM | POA: Diagnosis not present

## 2018-08-17 DIAGNOSIS — Z23 Encounter for immunization: Secondary | ICD-10-CM | POA: Diagnosis not present

## 2018-08-17 DIAGNOSIS — R519 Headache, unspecified: Secondary | ICD-10-CM | POA: Insufficient documentation

## 2018-08-17 DIAGNOSIS — S0093XA Contusion of unspecified part of head, initial encounter: Secondary | ICD-10-CM | POA: Diagnosis not present

## 2018-08-17 LAB — BASIC METABOLIC PANEL
BUN: 14 mg/dL (ref 6–23)
CO2: 30 mEq/L (ref 19–32)
Calcium: 10.7 mg/dL — ABNORMAL HIGH (ref 8.4–10.5)
Chloride: 99 mEq/L (ref 96–112)
Creatinine, Ser: 1.04 mg/dL (ref 0.40–1.50)
GFR: 72.34 mL/min (ref >60.00)
Glucose, Bld: 103 mg/dL — ABNORMAL HIGH (ref 70–99)
Potassium: 4.6 mEq/L (ref 3.5–5.1)
Sodium: 133 mEq/L — ABNORMAL LOW (ref 135–145)

## 2018-08-17 LAB — VITAMIN D 25 HYDROXY (VIT D DEFICIENCY, FRACTURES): VITD: 77.13 ng/mL (ref 30.00–100.00)

## 2018-08-17 NOTE — Progress Notes (Signed)
Subjective:    Patient ID: [name] Christensen, male    DOB: Jun 15, 1932, 84 y.o.   MRN: [id number]  HPI  Here to f/u after trip and fall over a hose in the yard but fell face first to concrete nearby.  No LOC or syncope and Pt denies chest pain, increased sob or doe, wheezing, orthopnea, PND, increased LE swelling, palpitations, dizziness or syncope.  Is on eliquis and scratched up his right glasses plastic lens, but also with severe right forehead hematoma and periocular edema bruising with mild discomfort.  The forehead hematoma seems to have much improved, while the right periocular area has actually worsened.  Has reduced field of vision but o/w no change in vision.  Drove here.  Denies other HA or fever.  Has cont'd the eliquis Past Medical History:  Diagnosis Date  . AICD (automatic cardioverter/defibrillator) present 04/07/2017  . Allergy   . Anxiety   . BPH (benign prostatic hypertrophy)    elevated PSA Dr. Lindell Noe Bx 2010  . Cardiac arrest [facility])    a. out-of-hospital arrest 10/27/2012 - EF 40-45%, patent grafts on cath, received St. Jude AICD.  [name] Carotid disease, bilateral (HCC)    a. 0-39% by doppers.  . Cataract    bil cataracts removed  . CHF (congestive heart failure) (HCC)   . Coronary artery disease    a. s/p MI/CABG 2005. b. s/p cath at time of VF arrest 10/2013 - grafts patent.  . Cough   . Diverticulosis   . Elevated LFTs    a. 10/2012 felt due to cardiac arrest - Hepatitis C Ab reactive from 11/14/2012>>Hep C RNA PCR negative 10/30/2012.   [name] GERD (gastroesophageal reflux disease)   . Hyperlipidemia   . Hypertension   . Internal hemorrhoids   . Myocardial infarction (HCC)    2005 - CABG x 5 2005  . RBBB   . Tubular adenoma of colon   . Ventricular bigeminy    a. Event monitor 01/2013: NSR with PVCs and occ bigeminy.   Past Surgical History:  Procedure Laterality Date  . APPENDECTOMY    . CARDIAC CATHETERIZATION  2007   with patent graft anatomy atretic left  internal mammary  artery to the LAD which is nonobstructive. Will restart study June 08, 2007  . CARDIOVERSION N/A 03/27/2018   Procedure: CARDIOVERSION;  Surgeon: Iran Ouch, MD;  Location: [facility] ORS;  Service: Cardiovascular;  Laterality: N/A;  . CATARACT EXTRACTION W/PHACO Right 04/23/2016   Procedure: CATARACT EXTRACTION PHACO AND INTRAOCULAR LENS PLACEMENT (IOC);  Surgeon: [name], MD;  Location: [facility] ORS;  Service: Ophthalmology;  Laterality: Right;  [place] 1.09 AP% 19.3 CDE 13.38 Fluid pack lot # [id number]  . CHOLECYSTECTOMY N/A 06/05/2017   Procedure: LAPAROSCOPIC CHOLECYSTECTOMY WITH INTRAOPERATIVE CHOLANGIOGRAM ERAS PATHWAY POSSIBLE NEEDLE CORE BIOPSY OF LIVER;  Surgeon: [name], MD;  Location: [facility] OR;  Service: General;  Laterality: N/A;  ERAS PATHWAY  . COLONOSCOPY    . CORONARY ARTERY BYPASS GRAFT  2005  . EYE SURGERY    . ICD GENERATOR CHANGEOUT N/A 04/07/2017   Procedure: ICD Generator Changeout;  Surgeon: Duke Salvia, MD;  Location: [facility] INVASIVE CV LAB;  Service: Cardiovascular;  Laterality: N/A;  . IMPLANTABLE CARDIOVERTER DEFIBRILLATOR IMPLANT N/A 11/24/2012   STJ single chamber ICD implanted by Dr Graciela Husbands for cardiac arrest   . INGUINAL HERNIA REPAIR Bilateral 01/17/2015   Procedure: BILATERAL LAPAROSCOPIC INGUINAL HERNIA REPAIR WITH LEFT FEMORAL HERNIA REPAIR;  Surgeon: [name], MD;  Location: [facility] OR;  Service: General;  Laterality: Bilateral;  . INSERTION OF MESH Bilateral 01/17/2015   Procedure: INSERTION OF MESH;  Surgeon: [name], MD;  Location: [facility] OR;  Service: General;  Laterality: Bilateral;  . LAPAROSCOPIC CHOLECYSTECTOMY  06/05/2017  . LEAD INSERTION N/A 04/07/2017   Procedure: RA Lead Insertion;  Surgeon: Duke Salvia, MD;  Location: [facility] INVASIVE CV LAB;  Service: Cardiovascular;  Laterality: N/A;  . LEAD REVISION/REPAIR N/A 04/08/2017   Procedure: Atrial Lead Revision/Repair;  Surgeon: Duke Salvia, MD;  Location: [facility] INVASIVE CV LAB;   Service: Cardiovascular;  Laterality: N/A;  . LEFT HEART CATHETERIZATION WITH CORONARY/GRAFT ANGIOGRAM N/A 10/27/2012   Procedure: LEFT HEART CATHETERIZATION WITH Isabel Caprice;  Surgeon: [name], MD;  Location: [facility] CATH LAB;  Service: Cardiovascular;  Laterality: N/A;  . LIVER BIOPSY N/A 06/05/2017   Procedure: SINGLE SITE LIVER BIOPSY ERAS PATHWAY;  Surgeon: [name], MD;  Location: [facility] OR;  Service: General;  Laterality: N/A;  ERAS PATHWAY  . LUMBAR FUSION  09/2007    reports that he has never smoked. He has never used smokeless tobacco. He reports that he does not drink alcohol or use drugs. family history includes Coronary artery disease in his mother; Heart attack in his brother. Allergies  Allergen Reactions  . Ezetimibe Other (See Comments)    Pt was in coma for 6 days   numbness  . Penicillins Other (See Comments)    BLISTERS BETWEEN FINGERS  Has patient had a PCN reaction causing immediate rash, facial/tongue/throat swelling, SOB or lightheadedness with hypotension: unknown Has patient had a PCN reaction causing severe rash involving mucus membranes or skin necrosis: unknown Has patient had a PCN reaction that required hospitalization no Has patient had a PCN reaction occurring within the last 10 years: no If all of the above answers are "NO", then may proceed with Cephalosporin use.   . Pravastatin Sodium Other (See Comments)    Aches and pains in joints  . Rosuvastatin Other (See Comments)    MYALGIAS  . Niacin Anxiety and Other (See Comments)    "Makes me feel nervous, jittery"   Current Outpatient Medications on File Prior to Visit  Medication Sig Dispense Refill  . acetaminophen (TYLENOL) 325 MG tablet Take 325 mg by mouth 3 (three) times daily as needed for mild pain.    [name] alfuzosin (UROXATRAL) 10 MG 24 hr tablet Take 10 mg by mouth every evening.     [name] apixaban (ELIQUIS) 5 MG TABS tablet Take 1 tablet (5 mg total) by mouth 2 (two) times daily. 60  tablet 11  . atorvastatin (LIPITOR) 20 MG tablet TAKE 1 TABLET BY MOUTH ONCE A DAY AT 6PM 90 tablet 1  . betamethasone dipropionate (DIPROLENE) 0.05 % cream Apply 1 application topically daily as needed.    . Cholecalciferol (VITAMIN D3) 400 units CAPS Take 400 Units by mouth daily.    . diazepam (VALIUM) 5 MG tablet Take 0.5-1 tablets (2.5-5 mg total) by mouth every 12 (twelve) hours as needed for anxiety. 30 tablet 2  . dutasteride (AVODART) 0.5 MG capsule Take 0.5 mg by mouth every evening.     . furosemide (LASIX) 20 MG tablet Take 2 tabs (40 mg) once daily, you may take an extra 2 tabs (40 mg) daily as needed for weight gain of 3 lbs or more in 24 hours 90 tablet 3  . hydrocortisone (ANUSOL-HC) 2.5 % rectal cream bid (Patient taking differently: Place 1 application rectally  2 (two) times daily as needed for hemorrhoids or anal itching. ) 30 g 1  . linaclotide (LINZESS) 290 MCG CAPS capsule Take 1 capsule (290 mcg total) by mouth as needed. (Patient taking differently: Take 290 mcg by mouth daily as needed (for constipation). ) 30 capsule 11  . losartan (COZAAR) 50 MG tablet Take 0.5 tablets (25 mg total) by mouth daily. (Patient taking differently: Take 25 mg by mouth every evening. ) 90 tablet 1  . metoprolol succinate (TOPROL-XL) 25 MG 24 hr tablet Take 1 tablet (25 mg total) by mouth daily. 90 tablet 3  . Multiple Vitamin (MULTIVITAMIN WITH MINERALS) TABS Take 1 tablet by mouth daily.    . nitroGLYCERIN (NITROSTAT) 0.4 MG SL tablet PLACE ONE TABLET UNDER TONGUE EVERY 5 MINUTES AS NEEDED FOR CHEST PAIN (3 DOSES MAX) 25 tablet 0  . Polyethyl Glycol-Propyl Glycol (SYSTANE OP) Place 1 drop into both eyes daily as needed (for dry eyes).    . vitamin B-12 (CYANOCOBALAMIN) 500 MCG tablet Take 500 mcg by mouth daily.      No current facility-administered medications on file prior to visit.    Review of Systems  Constitutional: Negative for other unusual diaphoresis or sweats HENT: Negative for  ear discharge or swelling Eyes: Negative for other worsening visual disturbances Respiratory: Negative for stridor or other swelling  Gastrointestinal: Negative for worsening distension or other blood Genitourinary: Negative for retention or other urinary change Musculoskeletal: Negative for other MSK pain or swelling Skin: Negative for color change or other new lesions Neurological: Negative for worsening tremors and other numbness  Psychiatric/Behavioral: Negative for worsening agitation or other fatigue All other system neg per pt    Objective:   Physical Exam BP 140/80 (BP Location: Left Arm, Patient Position: Sitting, Cuff Size: Normal)   Pulse 78   Temp 97.9 F (36.6 C) (Oral)   Ht 5\' 7"  (1.702 m)   Wt 145 lb (65.8 kg)   SpO2 98%   BMI 22.71 kg/m  VS noted,  Constitutional: Pt appears in NAD HENT: Head: NCAT.  Right Ear: External ear normal.  Left Ear: External ear normal.  Eyes: . Pupils are equal, round, and reactive to light. Conjunctivae and EOM are normal but has large periocular swelling and bruising mild tender, milder to left without reduced field of vision Right forehead abrasion without s/s infection and does have mild underlying right forehead hematoma Nose: without d/c or deformity Neck: Neck supple. Gross normal ROM Cardiovascular: Normal rate and regular rhythm.   Pulmonary/Chest: Effort normal and breath sounds without rales or wheezing.  Neurological: Pt is alert. At baseline orientation, motor grossly intact Skin: Skin is warm. No rashes, other new lesions, no LE edema Psychiatric: Pt behavior is normal without agitation  No other exam findings    Assessment & Plan:

## 2018-08-17 NOTE — Assessment & Plan Note (Signed)
Mechanical without prodromal symptoms before or after, no LOC, declines head CT,  to f/u any worsening symptoms or concerns

## 2018-08-17 NOTE — Assessment & Plan Note (Signed)
BP Readings from Last 3 Encounters:  08/17/18 140/80  07/07/18 (!) 104/52  06/16/18 120/62  stable overall by history and exam, recent data reviewed with pt, and pt to continue medical treatment as before,  to f/u any worsening symptoms or concerns

## 2018-08-17 NOTE — Patient Instructions (Addendum)
You had the Tetanus (Tdap) shot today  You can also take tylenol as needed for pain.  Please continue all other medications as before, and refills have been done if requested.  Please have the pharmacy call with any other refills you may need.  Please keep your appointments with your specialists as you may have planned  Please go to the XRAY Department in the Basement (go straight as you get off the elevator) for the x-ray testing  You will be contacted by phone if any changes need to be made immediately.  Otherwise, you will receive a letter about your results with an explanation, but please check with MyChart first.  Please remember to sign up for MyChart if you have not done so, as this will be important to you in the future with finding out test results, communicating by private email, and scheduling acute appointments online when needed.

## 2018-08-17 NOTE — Assessment & Plan Note (Signed)
Ok for facial films but I think less chance for fracture

## 2018-08-18 LAB — PTH, INTACT AND CALCIUM
Calcium: 10.7 mg/dL — ABNORMAL HIGH (ref 8.6–10.3)
PTH: 46 pg/mL (ref 14–64)

## 2018-08-19 LAB — CUP PACEART INCLINIC DEVICE CHECK
Battery Remaining Longevity: 62 mo
Brady Statistic RA Percent Paced: 99.66 %
Brady Statistic RV Percent Paced: 89 %
Date Time Interrogation Session: 20190813125026
Implantable Lead Implant Date: 20131218
Implantable Lead Implant Date: 20180514
Implantable Lead Location: 753859
Implantable Lead Location: 753860
Implantable Lead Model: 5076
Implantable Pulse Generator Implant Date: 20180514
Lead Channel Setting Pacing Amplitude: 2 V
Lead Channel Setting Pacing Amplitude: 2.5 V
Lead Channel Setting Pacing Pulse Width: 0.5 ms
Lead Channel Setting Sensing Sensitivity: 0.5 mV
Pulse Gen Serial Number: 1245762

## 2018-08-24 ENCOUNTER — Other Ambulatory Visit: Payer: Self-pay | Admitting: Internal Medicine

## 2018-09-08 ENCOUNTER — Ambulatory Visit (INDEPENDENT_AMBULATORY_CARE_PROVIDER_SITE_OTHER): Payer: PPO

## 2018-09-08 ENCOUNTER — Other Ambulatory Visit: Payer: Self-pay

## 2018-09-08 DIAGNOSIS — I255 Ischemic cardiomyopathy: Secondary | ICD-10-CM

## 2018-09-08 DIAGNOSIS — I4819 Other persistent atrial fibrillation: Secondary | ICD-10-CM

## 2018-09-08 DIAGNOSIS — I5022 Chronic systolic (congestive) heart failure: Secondary | ICD-10-CM

## 2018-09-09 ENCOUNTER — Ambulatory Visit: Payer: PPO | Admitting: Internal Medicine

## 2018-09-15 ENCOUNTER — Ambulatory Visit (INDEPENDENT_AMBULATORY_CARE_PROVIDER_SITE_OTHER): Payer: PPO | Admitting: Internal Medicine

## 2018-09-15 ENCOUNTER — Encounter: Payer: Self-pay | Admitting: Internal Medicine

## 2018-09-15 VITALS — BP 120/60 | HR 73 | Ht 67.0 in | Wt 145.0 lb

## 2018-09-15 DIAGNOSIS — I255 Ischemic cardiomyopathy: Secondary | ICD-10-CM | POA: Diagnosis not present

## 2018-09-15 DIAGNOSIS — I4819 Other persistent atrial fibrillation: Secondary | ICD-10-CM | POA: Diagnosis not present

## 2018-09-15 DIAGNOSIS — I5022 Chronic systolic (congestive) heart failure: Secondary | ICD-10-CM

## 2018-09-15 DIAGNOSIS — I469 Cardiac arrest, cause unspecified: Secondary | ICD-10-CM | POA: Diagnosis not present

## 2018-09-15 DIAGNOSIS — Z95810 Presence of automatic (implantable) cardiac defibrillator: Secondary | ICD-10-CM

## 2018-09-15 LAB — CUP PACEART INCLINIC DEVICE CHECK
Battery Remaining Longevity: 62 mo
Brady Statistic RA Percent Paced: 81 %
Brady Statistic RV Percent Paced: 68 %
Date Time Interrogation Session: 20191022141726
HighPow Impedance: 65.25 Ohm
Implantable Lead Implant Date: 20131218
Implantable Lead Implant Date: 20180514
Implantable Lead Location: 753859
Implantable Lead Location: 753860
Implantable Lead Model: 5076
Implantable Pulse Generator Implant Date: 20180514
Lead Channel Impedance Value: 425 Ohm
Lead Channel Impedance Value: 475 Ohm
Lead Channel Pacing Threshold Amplitude: 0.75 V
Lead Channel Pacing Threshold Amplitude: 1.25 V
Lead Channel Pacing Threshold Pulse Width: 0.5 ms
Lead Channel Pacing Threshold Pulse Width: 0.5 ms
Lead Channel Sensing Intrinsic Amplitude: 3.1 mV
Lead Channel Sensing Intrinsic Amplitude: 4.8 mV
Lead Channel Setting Pacing Amplitude: 2 V
Lead Channel Setting Pacing Amplitude: 2.5 V
Lead Channel Setting Pacing Pulse Width: 0.5 ms
Lead Channel Setting Sensing Sensitivity: 0.5 mV
Pulse Gen Serial Number: 1245762

## 2018-09-15 MED ORDER — LOSARTAN POTASSIUM 50 MG PO TABS: 50.0000 mg | ORAL_TABLET | Freq: Every day | ORAL | Status: DC

## 2018-09-15 MED ORDER — MEXILETINE HCL 150 MG PO CAPS: 150.0000 mg | ORAL_CAPSULE | Freq: Two times a day (BID) | ORAL | 6 refills | Status: DC

## 2018-09-15 NOTE — Progress Notes (Addendum)
Subjective:   [name] Christensen is a 84 y.o. male who presents for Medicare Annual/Subsequent preventive examination.  Review of Systems:  No ROS.  Medicare Wellness Visit. Additional risk factors are reflected in the social history.  Cardiac Risk Factors include: advanced age (>75men, >58 women);male gender Sleep patterns: has frequent nighttime awakenings, gets up 3 times nightly to void and sleeps 5-6 hours nightly. Patient reports insomnia issues, discussed recommended sleep tips. Relevant patient education assigned to patient using Emmi.  Home Safety/Smoke Alarms: Feels safe in home. Smoke alarms in place.  Living environment; residence and Firearm Safety: 1-story house/ trailer, no firearms. Lives with wife, no needs for DME, good support system Seat Belt Safety/Bike Helmet: Wears seat belt.      Objective:    Vitals: BP 114/62   Pulse 74   Resp 18   Ht 5\' 7"  (1.702 m)   Wt 145 lb (65.8 kg)   SpO2 98%   BMI 22.71 kg/m   Body mass index is 22.71 kg/m.  Advanced Directives 09/16/2018 06/05/2017 06/05/2017 05/30/2017 04/24/2017 04/07/2017 05/27/2015  Does Patient Have a Medical Advance Directive? Yes Yes Yes Yes Yes Yes Yes  Type of Estate agent of [place];Living will Healthcare Power of [employer] of [place];Living will Healthcare Power of [place];Living will Healthcare Power of [place];Living will Healthcare Power of [place];Living will Living will  Does patient want to make changes to medical advance directive? - No - Patient declined - - - No - Patient declined -  Copy of Healthcare Power of Attorney in Chart? No - copy requested Yes Yes - No - copy requested Yes No - copy requested  Pre-existing out of facility DNR order (yellow form or pink MOST form) - - - - - - -    Tobacco Social History   Tobacco Use  Smoking Status Never Smoker  Smokeless Tobacco Never Used     Counseling given: Not Answered  Past Medical History:    Diagnosis Date  . AICD (automatic cardioverter/defibrillator) present 04/07/2017  . Allergy   . Anxiety   . BPH (benign prostatic hypertrophy)    elevated PSA Dr. Lindell Noe Bx 2010  . Cardiac arrest [facility])    a. out-of-hospital arrest 11/13/2012 - EF 40-45%, patent grafts on cath, received [employer] Jude AICD.  [name] Carotid disease, bilateral (HCC)    a. 0-39% by doppers.  . Cataract    bil cataracts removed  . CHF (congestive heart failure) (HCC)   . Coronary artery disease    a. s/p MI/CABG 2005. b. s/p cath at time of VF arrest 10/2013 - grafts patent.  . Cough   . Diverticulosis   . Elevated LFTs    a. 10/2012 felt due to cardiac arrest - Hepatitis C Ab reactive from 10/27/2012>>Hep C RNA PCR negative 11/15/2012.   [name] GERD (gastroesophageal reflux disease)   . Hyperlipidemia   . Hypertension   . Internal hemorrhoids   . Myocardial infarction (HCC)    2005 - CABG x 5 2005  . RBBB   . Tubular adenoma of colon   . Ventricular bigeminy    a. Event monitor 01/2013: NSR with PVCs and occ bigeminy.   Past Surgical History:  Procedure Laterality Date  . APPENDECTOMY    . CARDIAC CATHETERIZATION  2007   with patent graft anatomy atretic left internal mammary  artery to the LAD which is nonobstructive. Will restart study June 08, 2007  . CARDIOVERSION N/A 03/27/2018   Procedure: CARDIOVERSION;  Surgeon: Iran Ouch, MD;  Location: [facility] ORS;  Service: Cardiovascular;  Laterality: N/A;  . CATARACT EXTRACTION W/PHACO Right 04/23/2016   Procedure: CATARACT EXTRACTION PHACO AND INTRAOCULAR LENS PLACEMENT (IOC);  Surgeon: [name], MD;  Location: [facility] ORS;  Service: Ophthalmology;  Laterality: Right;  [place] 1.09 AP% 19.3 CDE 13.38 Fluid pack lot # [id number]  . CHOLECYSTECTOMY N/A 06/05/2017   Procedure: LAPAROSCOPIC CHOLECYSTECTOMY WITH INTRAOPERATIVE CHOLANGIOGRAM ERAS PATHWAY POSSIBLE NEEDLE CORE BIOPSY OF LIVER;  Surgeon: [name], MD;  Location: [facility] OR;  Service: General;  Laterality:  N/A;  ERAS PATHWAY  . COLONOSCOPY    . CORONARY ARTERY BYPASS GRAFT  2005  . EYE SURGERY    . ICD GENERATOR CHANGEOUT N/A 04/07/2017   Procedure: ICD Generator Changeout;  Surgeon: Duke Salvia, MD;  Location: [facility] INVASIVE CV LAB;  Service: Cardiovascular;  Laterality: N/A;  . IMPLANTABLE CARDIOVERTER DEFIBRILLATOR IMPLANT N/A 11/14/2012   STJ single chamber ICD implanted by Dr Graciela Husbands for cardiac arrest   . INGUINAL HERNIA REPAIR Bilateral 01/17/2015   Procedure: BILATERAL LAPAROSCOPIC INGUINAL HERNIA REPAIR WITH LEFT FEMORAL HERNIA REPAIR;  Surgeon: [name], MD;  Location: [facility] OR;  Service: General;  Laterality: Bilateral;  . INSERTION OF MESH Bilateral 01/17/2015   Procedure: INSERTION OF MESH;  Surgeon: [name], MD;  Location: [facility] OR;  Service: General;  Laterality: Bilateral;  . LAPAROSCOPIC CHOLECYSTECTOMY  06/05/2017  . LEAD INSERTION N/A 04/07/2017   Procedure: RA Lead Insertion;  Surgeon: Duke Salvia, MD;  Location: [facility] INVASIVE CV LAB;  Service: Cardiovascular;  Laterality: N/A;  . LEAD REVISION/REPAIR N/A 04/08/2017   Procedure: Atrial Lead Revision/Repair;  Surgeon: Duke Salvia, MD;  Location: [facility] INVASIVE CV LAB;  Service: Cardiovascular;  Laterality: N/A;  . LEFT HEART CATHETERIZATION WITH CORONARY/GRAFT ANGIOGRAM N/A 11/16/2012   Procedure: LEFT HEART CATHETERIZATION WITH Isabel Caprice;  Surgeon: [name], MD;  Location: [facility] CATH LAB;  Service: Cardiovascular;  Laterality: N/A;  . LIVER BIOPSY N/A 06/05/2017   Procedure: SINGLE SITE LIVER BIOPSY ERAS PATHWAY;  Surgeon: [name], MD;  Location: [facility] OR;  Service: General;  Laterality: N/A;  ERAS PATHWAY  . LUMBAR FUSION  09/2007   Family History  Problem Relation Age of Onset  . Coronary artery disease Mother   . Heart attack Brother   . Stomach cancer Neg Hx   . Colon cancer Neg Hx   . Esophageal cancer Neg Hx   . Pancreatic cancer Neg Hx   . Prostate cancer Neg Hx   . Rectal cancer Neg Hx     Social History   Socioeconomic History  . Marital status: Married    Spouse name: Not on file  . Number of children: 5  . Years of education: Not on file  . Highest education level: Not on file  Occupational History  . 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 Financial resource strain: Not hard at all  . Food insecurity:    Worry: Never true    Inability: Never true  . Transportation needs:    Medical: No    Non-medical: No  Tobacco Use  . Smoking status: Never Smoker  . Smokeless tobacco: Never Used  Substance and Sexual Activity  . Alcohol use: No  . Drug use: No  . Sexual activity: Never  Lifestyle  . Physical activity:    Days per week: 6 days    Minutes per session: 50 min  . Stress:  Not at all  Relationships  . Social connections:    Talks on phone: More than three times a week    Gets together: More than three times a week    Attends religious service: More than 4 times per year    Active member of club or organization: Yes    Attends meetings of clubs or organizations: More than 4 times per year    Relationship status: Married  Other Topics Concern  . Not on file  Social History Narrative  . Not on file    Outpatient Encounter Medications as of 09/16/2018  Medication Sig  . acetaminophen (TYLENOL) 325 MG tablet Take 325 mg by mouth 3 (three) times daily as needed for mild pain.  [name] alfuzosin (UROXATRAL) 10 MG 24 hr tablet Take 10 mg by mouth every evening.   [name] apixaban (ELIQUIS) 5 MG TABS tablet Take 1 tablet (5 mg total) by mouth 2 (two) times daily.  [name] atorvastatin (LIPITOR) 20 MG tablet TAKE 1 TABLET BY MOUTH ONCE A DAY AT 6PM  . betamethasone dipropionate (DIPROLENE) 0.05 % cream Apply 1 application topically daily as needed.  . Cholecalciferol (VITAMIN D3) 400 units CAPS Take 400 Units by mouth daily.  . diazepam (VALIUM) 5 MG tablet Take 0.5-1 tablets (2.5-5 mg total) by mouth every 12 (twelve) hours as needed for anxiety.   . dutasteride (AVODART) 0.5 MG capsule Take 0.5 mg by mouth every evening.   . furosemide (LASIX) 20 MG tablet Take 2 tabs (40 mg) once daily, you may take an extra 2 tabs (40 mg) daily as needed for weight gain of 3 lbs or more in 24 hours  . hydrocortisone (ANUSOL-HC) 2.5 % rectal cream bid (Patient taking differently: Place 1 application rectally 2 (two) times daily as needed for hemorrhoids or anal itching. )  . linaclotide (LINZESS) 290 MCG CAPS capsule Take 1 capsule (290 mcg total) by mouth as needed. (Patient taking differently: Take 290 mcg by mouth daily as needed (for constipation). )  . losartan (COZAAR) 50 MG tablet Take 1 tablet (50 mg total) by mouth daily.  . metoprolol succinate (TOPROL-XL) 25 MG 24 hr tablet Take 1 tablet (25 mg total) by mouth daily.  [name] mexiletine (MEXITIL) 150 MG capsule Take 1 capsule (150 mg total) by mouth 2 (two) times daily.  . Multiple Vitamin (MULTIVITAMIN WITH MINERALS) TABS Take 1 tablet by mouth daily.  . nitroGLYCERIN (NITROSTAT) 0.4 MG SL tablet PLACE ONE TABLET UNDER TONGUE EVERY 5 MINUTES AS NEEDED FOR CHEST PAIN (3 DOSES MAX)  . Polyethyl Glycol-Propyl Glycol (SYSTANE OP) Place 1 drop into both eyes daily as needed (for dry eyes).  . vitamin B-12 (CYANOCOBALAMIN) 500 MCG tablet Take 500 mcg by mouth daily.    No facility-administered encounter medications on file as of 09/16/2018.     Activities of Daily Living In your present state of health, do you have any difficulty performing the following activities: 09/16/2018  Hearing? N  Vision? N  Difficulty concentrating or making decisions? N  Walking or climbing stairs? N  Dressing or bathing? N  Doing errands, shopping? N  Preparing Food and eating ? N  Using the Toilet? N  In the past six months, have you accidently leaked urine? N  Do you have problems with loss of bowel control? N  Managing your Medications? N  Managing your Finances? N  Housekeeping or managing your Housekeeping? N   Some recent data might be hidden    Patient Care  Team: Tresa Garter, MD as PCP - General (Internal Medicine) Wendall Stade, MD as PCP - Cardiology (Cardiology) Duke Salvia, MD as PCP - Electrophysiology (Cardiology) Lindell Noe Lehman Prom., MD (Urology) [name], MD as Consulting Physician (General Surgery) Pyrtle, Carie Caddy, MD as Consulting Physician (Gastroenterology) [name], MD as Referring Physician (Ophthalmology)   Assessment:   This is a routine wellness examination for [employer]. Physical assessment deferred to PCP.   Exercise Activities and Dietary recommendations Current Exercise Habits: Structured exercise class;Home exercise routine, Type of exercise: walking;strength training/weights, Time (Minutes): 50, Frequency (Times/Week): 5, Weekly Exercise (Minutes/Week): 250, Intensity: Mild, Exercise limited by: None identified  Diet (meal preparation, eat out, water intake, caffeinated beverages, dairy products, fruits and vegetables): in general, a "healthy" diet  , well balanced. eats a variety of fruits and vegetables daily, limits salt, fat/cholesterol, sugar,carbohydrates,caffeine, drinks 6-8 glasses of water daily.  Goals    . Maintain current health status     Continue to exercise, eat healthy and strong, enjoy life, family and stay spiritually connected to God.     . Patient Stated     Stay as healthy and as independent as possible       Fall Risk Fall Risk  09/16/2018 05/12/2018 04/24/2017 10/02/2016 08/09/2015  Falls in the past year? Yes No Yes Yes Yes  Number falls in past yr: 1 - 1 1 2  or more  Injury with Fall? Yes - - No Yes  Follow up Education provided;Falls prevention discussed - - - -   Depression Screen PHQ 2/9 Scores 05/12/2018 04/24/2017 10/02/2016 08/09/2015  PHQ - 2 Score 0 0 0 0    Cognitive Function       Ad8 score reviewed for issues:  Issues making decisions: no  Less interest in hobbies / activities: no  Repeats  questions, stories (family complaining): no  Trouble using ordinary gadgets (microwave, computer, phone):no  Forgets the month or year: no  Mismanaging finances: no  Remembering appts: no  Daily problems with thinking and/or memory: no Ad8 score is= 0  Immunization History  Administered Date(s) Administered  . Influenza Whole 09/07/2008, 12/11/2009, 08/26/2011, 09/25/2012  . Influenza, High Dose Seasonal PF 08/05/2016, 08/11/2017, 08/17/2018  . Influenza,inj,Quad PF,6+ Mos 08/30/2013, 08/03/2014, 08/09/2015  . Pneumococcal Conjugate-13 01/31/2014  . Pneumococcal Polysaccharide-23 09/08/2007, 06/04/2016  . Td 09/09/2001, 12/26/2011  . Tdap 08/17/2018  . Zoster Recombinat (Shingrix) 08/04/2018   Screening Tests Health Maintenance  Topic Date Due  . TETANUS/TDAP  08/17/2028  . INFLUENZA VACCINE  Completed  . PNA vac Low Risk Adult  Completed      Plan:     Continue doing brain stimulating activities (puzzles, reading, adult coloring books, staying active) to keep memory sharp.   Continue to eat heart healthy diet (full of fruits, vegetables, whole grains, lean protein, water--limit salt, fat, and sugar intake) and increase physical activity as tolerated.  I have personally reviewed and noted the following in the patient's chart:   . Medical and social history . Use of alcohol, tobacco or illicit drugs  . Current medications and supplements . Functional ability and status . Nutritional status . Physical activity . Advanced directives . List of other physicians . Vitals . Screenings to include cognitive, depression, and falls . Referrals and appointments  In addition, I have reviewed and discussed with patient certain preventive protocols, quality metrics, and best practice recommendations. A written personalized care plan for preventive services as well as general preventive health recommendations  were provided to patient.     Wanda Plump,  RN  09/16/2018  Medical screening examination/treatment/procedure(s) were performed by non-physician practitioner and as supervising physician I was immediately available for consultation/collaboration. I agree with above. Jacinta Shoe, MD

## 2018-09-15 NOTE — Patient Instructions (Signed)
Medication Instructions:  - Your physician has recommended you make the following change in your medication:   1) INCREASE cozaar (losartan) 50 mg - take 1 tablet by mouth once daily  2) START mexiletine 150 mg- take 1 tablet by mouth TWICE daily  If you need a refill on your cardiac medications before your next appointment, please call your pharmacy.   Lab work: - none ordered  If you have labs (blood work) drawn today and your tests are completely normal, you will receive your results only by: [name] MyChart Message (if you have MyChart) OR . A paper copy in the mail If you have any lab test that is abnormal or we need to change your treatment, we will call you to review the results.  Testing/Procedures: - none ordered  Follow-Up: At [facility], you and your health needs are our priority.  As part of our continuing mission to provide you with exceptional heart care, we have created designated Provider Care Teams.  These Care Teams include your primary Cardiologist (physician) and Advanced Practice Providers (APPs -  Physician Assistants and Nurse Practitioners) who all work together to provide you with the care you need, when you need it. . You will need a follow up appointment in 6 months with Dr. [name].  Please call our office 2 months in advance to schedule this appointment.   Remote monitoring is used to monitor your Pacemaker of ICD from home. This monitoring reduces the number of office visits required to check your device to one time per year. It allows [place] to keep an eye on the functioning of your device to ensure it is working properly. You are scheduled for a device check from home on 10/06/18. You may send your transmission at any time that day. If you have a wireless device, the transmission will be sent automatically. After your physician reviews your transmission, you will receive a postcard with your next transmission date.   Any Other Special Instructions Will Be Listed  Below (If Applicable). - N/A

## 2018-09-15 NOTE — Progress Notes (Signed)
2015     Patient Care Team: Plotnikov, Georgina Quint, MD as PCP - General (Internal Medicine) Wendall Stade, MD as PCP - Cardiology (Cardiology) Duke Salvia, MD as PCP - Electrophysiology (Cardiology) Lindell Noe Lehman Prom., MD (Urology) [name], MD as Consulting Physician (General Surgery) Pyrtle, Carie Caddy, MD as Consulting Physician (Gastroenterology)   HPI  [name] Christensen is a 84 y.o. male Seen follow up for ICD implantation-single chamber undertaken for aborted cardiac arrest in the setting of ischemic heart disease with prior bypass surgery. He also has a history of frequent PVCs  Catheterization 12/13 demonstrating patent grafts with an atretic LIMA; LV function was normal S/p CABG    DATE TEST PVC  1/15 Holter 13%  1/18 Holter 31%     DATE TEST EF   12/13 Cath   Patent Grafts  2/15 Echo   45-50 %   12/17 Echo   50-55   4/19 Echo 20-25%   10/19 Echo  25-30%     Date Cr K Hgb  5/18 0.97  12.2  7/18 1.31  10.5  12.18  1.00 4.7 12.2  4/19 1.1 4.4 11.7   2/19 device interrogation had demonstrated recurrent episodes of "self terminating atrial fibrillation."  His device was reprogrammed to DDIR to avoid tracking  PVC burden prompted amio for suppression; also for Afib for which he underwent DCCV 5/19  He was intolerant and amio was discontinued.    Modest exercise intolerance.  Mostly fatigue without specific shortness of breath.  No edema.  Took a fall.     Past Medical History:  Diagnosis Date  . AICD (automatic cardioverter/defibrillator) present 04/07/2017  . Allergy   . Anxiety   . BPH (benign prostatic hypertrophy)    elevated PSA Dr. Lindell Noe Bx 2010  . Cardiac arrest [facility])    a. out-of-hospital arrest 10/25/2012 - EF 40-45%, patent grafts on cath, received St. Jude AICD.  [name] Carotid disease, bilateral (HCC)    a. 0-39% by doppers.  . Cataract    bil cataracts removed  . CHF (congestive heart failure) (HCC)   . Coronary artery disease      a. s/p MI/CABG 2005. b. s/p cath at time of VF arrest 10/2013 - grafts patent.  . Cough   . Diverticulosis   . Elevated LFTs    a. 10/2012 felt due to cardiac arrest - Hepatitis C Ab reactive from 11/20/2012>>Hep C RNA PCR negative 11/17/2012.   [name] GERD (gastroesophageal reflux disease)   . Hyperlipidemia   . Hypertension   . Internal hemorrhoids   . Myocardial infarction (HCC)    2005 - CABG x 5 2005  . RBBB   . Tubular adenoma of colon   . Ventricular bigeminy    a. Event monitor 01/2013: NSR with PVCs and occ bigeminy.    Past Surgical History:  Procedure Laterality Date  . APPENDECTOMY    . CARDIAC CATHETERIZATION  2007   with patent graft anatomy atretic left internal mammary  artery to the LAD which is nonobstructive. Will restart study June 08, 2007  . CARDIOVERSION N/A 03/27/2018   Procedure: CARDIOVERSION;  Surgeon: Iran Ouch, MD;  Location: [facility] ORS;  Service: Cardiovascular;  Laterality: N/A;  . CATARACT EXTRACTION W/PHACO Right 04/23/2016   Procedure: CATARACT EXTRACTION PHACO AND INTRAOCULAR LENS PLACEMENT (IOC);  Surgeon: [name], MD;  Location: [facility] ORS;  Service: Ophthalmology;  Laterality: Right;  [place] 1.09 AP% 19.3 CDE 13.38 Fluid pack lot # [id number]  .  CHOLECYSTECTOMY N/A 06/05/2017   Procedure: LAPAROSCOPIC CHOLECYSTECTOMY WITH INTRAOPERATIVE CHOLANGIOGRAM ERAS PATHWAY POSSIBLE NEEDLE CORE BIOPSY OF LIVER;  Surgeon: [name], MD;  Location: [facility] OR;  Service: General;  Laterality: N/A;  ERAS PATHWAY  . COLONOSCOPY    . CORONARY ARTERY BYPASS GRAFT  2005  . EYE SURGERY    . ICD GENERATOR CHANGEOUT N/A 04/07/2017   Procedure: ICD Generator Changeout;  Surgeon: Duke Salvia, MD;  Location: [facility] INVASIVE CV LAB;  Service: Cardiovascular;  Laterality: N/A;  . IMPLANTABLE CARDIOVERTER DEFIBRILLATOR IMPLANT N/A 11/14/2012   STJ single chamber ICD implanted by Dr Graciela Husbands for cardiac arrest   . INGUINAL HERNIA REPAIR Bilateral 01/17/2015   Procedure:  BILATERAL LAPAROSCOPIC INGUINAL HERNIA REPAIR WITH LEFT FEMORAL HERNIA REPAIR;  Surgeon: [name], MD;  Location: [facility] OR;  Service: General;  Laterality: Bilateral;  . INSERTION OF MESH Bilateral 01/17/2015   Procedure: INSERTION OF MESH;  Surgeon: [name], MD;  [facility];  Service: General;  Laterality: Bilateral;  . LAPAROSCOPIC CHOLECYSTECTOMY  06/05/2017  . LEAD INSERTION N/A 04/07/2017   Procedure: RA Lead Insertion;  Surgeon: Duke Salvia, MD;  Location: [facility] INVASIVE CV LAB;  Service: Cardiovascular;  Laterality: N/A;  . LEAD REVISION/REPAIR N/A 04/08/2017   Procedure: Atrial Lead Revision/Repair;  Surgeon: Duke Salvia, MD;  Location: [facility] INVASIVE CV LAB;  Service: Cardiovascular;  Laterality: N/A;  . LEFT HEART CATHETERIZATION WITH CORONARY/GRAFT ANGIOGRAM N/A 11-29-12   Procedure: LEFT HEART CATHETERIZATION WITH Isabel Caprice;  Surgeon: [name], MD;  Location: [facility] CATH LAB;  Service: Cardiovascular;  Laterality: N/A;  . LIVER BIOPSY N/A 06/05/2017   Procedure: SINGLE SITE LIVER BIOPSY ERAS PATHWAY;  Surgeon: [name], MD;  Location: [facility] OR;  Service: General;  Laterality: N/A;  ERAS PATHWAY  . LUMBAR FUSION  09/2007    Current Outpatient Medications  Medication Sig Dispense Refill  . acetaminophen (TYLENOL) 325 MG tablet Take 325 mg by mouth 3 (three) times daily as needed for mild pain.    [name] alfuzosin (UROXATRAL) 10 MG 24 hr tablet Take 10 mg by mouth every evening.     [name] apixaban (ELIQUIS) 5 MG TABS tablet Take 1 tablet (5 mg total) by mouth 2 (two) times daily. 60 tablet 11  . atorvastatin (LIPITOR) 20 MG tablet TAKE 1 TABLET BY MOUTH ONCE A DAY AT 6PM 90 tablet 1  . betamethasone dipropionate (DIPROLENE) 0.05 % cream Apply 1 application topically daily as needed.    . Cholecalciferol (VITAMIN D3) 400 units CAPS Take 400 Units by mouth daily.    . diazepam (VALIUM) 5 MG tablet Take 0.5-1 tablets (2.5-5 mg total) by mouth every 12 (twelve) hours  as needed for anxiety. 30 tablet 2  . dutasteride (AVODART) 0.5 MG capsule Take 0.5 mg by mouth every evening.     . furosemide (LASIX) 20 MG tablet Take 2 tabs (40 mg) once daily, you may take an extra 2 tabs (40 mg) daily as needed for weight gain of 3 lbs or more in 24 hours 90 tablet 3  . hydrocortisone (ANUSOL-HC) 2.5 % rectal cream bid (Patient taking differently: Place 1 application rectally 2 (two) times daily as needed for hemorrhoids or anal itching. ) 30 g 1  . linaclotide (LINZESS) 290 MCG CAPS capsule Take 1 capsule (290 mcg total) by mouth as needed. (Patient taking differently: Take 290 mcg by mouth daily as needed (for constipation). ) 30 capsule 11  . losartan (COZAAR) 50 MG tablet  Take 0.5 tablets (25 mg total) by mouth daily. (Patient taking differently: Take 25 mg by mouth every evening. ) 90 tablet 1  . metoprolol succinate (TOPROL-XL) 25 MG 24 hr tablet Take 1 tablet (25 mg total) by mouth daily. 90 tablet 3  . Multiple Vitamin (MULTIVITAMIN WITH MINERALS) TABS Take 1 tablet by mouth daily.    . nitroGLYCERIN (NITROSTAT) 0.4 MG SL tablet PLACE ONE TABLET UNDER TONGUE EVERY 5 MINUTES AS NEEDED FOR CHEST PAIN (3 DOSES MAX) 25 tablet 0  . Polyethyl Glycol-Propyl Glycol (SYSTANE OP) Place 1 drop into both eyes daily as needed (for dry eyes).    . vitamin B-12 (CYANOCOBALAMIN) 500 MCG tablet Take 500 mcg by mouth daily.      No current facility-administered medications for this visit.     Allergies  Allergen Reactions  . Ezetimibe Other (See Comments)    Pt was in coma for 6 days   numbness  . Penicillins Other (See Comments)    BLISTERS BETWEEN FINGERS  Has patient had a PCN reaction causing immediate rash, facial/tongue/throat swelling, SOB or lightheadedness with hypotension: unknown Has patient had a PCN reaction causing severe rash involving mucus membranes or skin necrosis: unknown Has patient had a PCN reaction that required hospitalization no Has patient had a PCN  reaction occurring within the last 10 years: no If all of the above answers are "NO", then may proceed with Cephalosporin use.   . Pravastatin Sodium Other (See Comments)    Aches and pains in joints  . Rosuvastatin Other (See Comments)    MYALGIAS  . Niacin Anxiety and Other (See Comments)    "Makes me feel nervous, jittery"    Review of Systems negative except from HPI and PMH  Physical Exam BP 120/60 (BP Location: Left Arm, Patient Position: Sitting, Cuff Size: Normal)   Pulse 73   Ht 5\' 7"  (1.702 m)   Wt 145 lb (65.8 kg)   BMI 22.71 kg/m  Well developed and nourished in no acute distress HENT normal Neck supple with JVP-flat Clear Regular rate and rhythm, no murmurs or gallops Abd-soft with active BS No Clubbing cyanosis edema Skin-warm and dry A & Oriented  Grossly normal sensory and motor function   ECG P synchronous pacing with PVCs trigeminal  Assessment and  Plan  Atrial fibrillation/flutter-persistent  PVCs-right bundle branch superior axis identified by the device with decreased ventricular pacing  Cardiomyopathy recurrent  History of aborted sudden cardiac death    Implantable defibrillator-St. Jude   upgraded single-dual-chamber May 2018  Ischemic cardiomyopathy with prior CABG    Congestive Heart Failure , acute/chronic systolic  Right bundle branch block.  Sinus bradycardia/chronotropic incompetence    Blood pressure is better.  Without lightheadedness.  Will uptitrate his losartan a little bit.  We will begin him on mexiletine for PVC suppression.  We will track this off of his remote.  In the event that we make no headway, we have discussed catheter ablation versus CRT upgrade as alternative strategies.  Given the temporal relationship of his LV dysfunction to the onset of the right ventricular apical pacing and knowing that he had PVCs in the 15% range in 2015 when his LV function was still normal, makes me lean towards pacemaker cardiomyopathy  although there certainly could be a late trajectory fall off and his cardiopathy could be related to PVCs.  Euvolemic continue current meds  We spent more than 50% of our >25 min visit in face to face counseling  regarding the above

## 2018-09-16 ENCOUNTER — Encounter: Payer: Self-pay | Admitting: Internal Medicine

## 2018-09-16 ENCOUNTER — Ambulatory Visit (INDEPENDENT_AMBULATORY_CARE_PROVIDER_SITE_OTHER): Payer: PPO | Admitting: Internal Medicine

## 2018-09-16 ENCOUNTER — Ambulatory Visit: Payer: PPO | Admitting: *Deleted

## 2018-09-16 VITALS — BP 114/62 | HR 74 | Temp 97.9°F | Ht 67.0 in | Wt 145.0 lb

## 2018-09-16 VITALS — BP 114/62 | HR 74 | Resp 18 | Ht 67.0 in | Wt 145.0 lb

## 2018-09-16 DIAGNOSIS — E785 Hyperlipidemia, unspecified: Secondary | ICD-10-CM

## 2018-09-16 DIAGNOSIS — R51 Headache: Secondary | ICD-10-CM | POA: Diagnosis not present

## 2018-09-16 DIAGNOSIS — Z0000 Encounter for general adult medical examination without abnormal findings: Secondary | ICD-10-CM

## 2018-09-16 DIAGNOSIS — R519 Headache, unspecified: Secondary | ICD-10-CM

## 2018-09-16 DIAGNOSIS — I10 Essential (primary) hypertension: Secondary | ICD-10-CM

## 2018-09-16 DIAGNOSIS — I2510 Atherosclerotic heart disease of native coronary artery without angina pectoris: Secondary | ICD-10-CM | POA: Diagnosis not present

## 2018-09-16 DIAGNOSIS — I5032 Chronic diastolic (congestive) heart failure: Secondary | ICD-10-CM

## 2018-09-16 DIAGNOSIS — I480 Paroxysmal atrial fibrillation: Secondary | ICD-10-CM | POA: Diagnosis not present

## 2018-09-16 NOTE — Patient Instructions (Addendum)
Continue doing brain stimulating activities (puzzles, reading, adult coloring books, staying active) to keep memory sharp.   Continue to eat heart healthy diet (full of fruits, vegetables, whole grains, lean protein, water--limit salt, fat, and sugar intake) and increase physical activity as tolerated.   [name] Christensen , Thank you for taking time to come for your Medicare Wellness Visit. I appreciate your ongoing commitment to your health goals. Please review the following plan we discussed and let me know if I can assist you in the future.   These are the goals we discussed: Goals    . Maintain current health status     Continue to exercise, eat healthy and strong, enjoy life, family and stay spiritually connected to God.     . Patient Stated     Stay as healthy and as independent as possible       This is a list of the screening recommended for you and due dates:  Health Maintenance  Topic Date Due  . Tetanus Vaccine  08/17/2028  . Flu Shot  Completed  . Pneumonia vaccines  Completed

## 2018-09-16 NOTE — Progress Notes (Signed)
Subjective:  Patient ID: [name] Christensen, male    DOB: 03-10-1932  Age: 84 y.o. MRN: [id number]  CC: No chief complaint on file.   HPI [name] Christensen presents for CHF, face contusion, CAD f/u The pt saw Dr [name] yesterday  Outpatient Medications Prior to Visit  Medication Sig Dispense Refill  . acetaminophen (TYLENOL) 325 MG tablet Take 325 mg by mouth 3 (three) times daily as needed for mild pain.    [name] alfuzosin (UROXATRAL) 10 MG 24 hr tablet Take 10 mg by mouth every evening.     [name] apixaban (ELIQUIS) 5 MG TABS tablet Take 1 tablet (5 mg total) by mouth 2 (two) times daily. 60 tablet 11  . atorvastatin (LIPITOR) 20 MG tablet TAKE 1 TABLET BY MOUTH ONCE A DAY AT 6PM 90 tablet 1  . betamethasone dipropionate (DIPROLENE) 0.05 % cream Apply 1 application topically daily as needed.    . Cholecalciferol (VITAMIN D3) 400 units CAPS Take 400 Units by mouth daily.    . diazepam (VALIUM) 5 MG tablet Take 0.5-1 tablets (2.5-5 mg total) by mouth every 12 (twelve) hours as needed for anxiety. 30 tablet 2  . dutasteride (AVODART) 0.5 MG capsule Take 0.5 mg by mouth every evening.     . furosemide (LASIX) 20 MG tablet Take 2 tabs (40 mg) once daily, you may take an extra 2 tabs (40 mg) daily as needed for weight gain of 3 lbs or more in 24 hours 90 tablet 3  . hydrocortisone (ANUSOL-HC) 2.5 % rectal cream bid (Patient taking differently: Place 1 application rectally 2 (two) times daily as needed for hemorrhoids or anal itching. ) 30 g 1  . linaclotide (LINZESS) 290 MCG CAPS capsule Take 1 capsule (290 mcg total) by mouth as needed. (Patient taking differently: Take 290 mcg by mouth daily as needed (for constipation). ) 30 capsule 11  . losartan (COZAAR) 50 MG tablet Take 1 tablet (50 mg total) by mouth daily.    . metoprolol succinate (TOPROL-XL) 25 MG 24 hr tablet Take 1 tablet (25 mg total) by mouth daily. 90 tablet 3  . mexiletine (MEXITIL) 150 MG capsule Take 1 capsule (150 mg total) by mouth 2  (two) times daily. 60 capsule 6  . Multiple Vitamin (MULTIVITAMIN WITH MINERALS) TABS Take 1 tablet by mouth daily.    . nitroGLYCERIN (NITROSTAT) 0.4 MG SL tablet PLACE ONE TABLET UNDER TONGUE EVERY 5 MINUTES AS NEEDED FOR CHEST PAIN (3 DOSES MAX) 25 tablet 0  . Polyethyl Glycol-Propyl Glycol (SYSTANE OP) Place 1 drop into both eyes daily as needed (for dry eyes).    . vitamin B-12 (CYANOCOBALAMIN) 500 MCG tablet Take 500 mcg by mouth daily.      No facility-administered medications prior to visit.     ROS: Review of Systems  Constitutional: Positive for fatigue. Negative for appetite change and unexpected weight change.  HENT: Negative for congestion, nosebleeds, sneezing, sore throat and trouble swallowing.   Eyes: Negative for itching and visual disturbance.  Respiratory: Negative for cough, chest tightness, shortness of breath and wheezing.   Cardiovascular: Negative for chest pain, palpitations and leg swelling.  Gastrointestinal: Negative for abdominal distention, blood in stool, diarrhea and nausea.  Genitourinary: Negative for frequency and hematuria.  Musculoskeletal: Negative for back pain, gait problem, joint swelling and neck pain.  Skin: Negative for rash.  Neurological: Negative for dizziness, tremors, speech difficulty and weakness.  Psychiatric/Behavioral: Negative for agitation, dysphoric mood and sleep disturbance. The patient  is not nervous/anxious.     Objective:  BP 114/62 (BP Location: Left Arm, Patient Position: Sitting, Cuff Size: Normal)   Pulse 74   Temp 97.9 F (36.6 C) (Oral)   Ht 5\' 7"  (1.702 m)   Wt 145 lb (65.8 kg)   SpO2 98%   BMI 22.71 kg/m   BP Readings from Last 3 Encounters:  09/16/18 114/62  09/15/18 120/60  08/17/18 140/80    Wt Readings from Last 3 Encounters:  09/16/18 145 lb (65.8 kg)  09/15/18 145 lb (65.8 kg)  08/17/18 145 lb (65.8 kg)    Physical Exam  Constitutional: He is oriented to person, place, and time. He appears  well-developed. No distress.  NAD  HENT:  Mouth/Throat: Oropharynx is clear and moist.  Eyes: Pupils are equal, round, and reactive to light. Conjunctivae are normal.  Neck: Normal range of motion. No JVD present. No thyromegaly present.  Cardiovascular: Normal rate, regular rhythm, normal heart sounds and intact distal pulses. Exam reveals no gallop and no friction rub.  No murmur heard. Pulmonary/Chest: Effort normal and breath sounds normal. No respiratory distress. He has no wheezes. He has no rales. He exhibits no tenderness.  Abdominal: Soft. Bowel sounds are normal. He exhibits no distension and no mass. There is no tenderness. There is no rebound and no guarding.  Musculoskeletal: Normal range of motion. He exhibits no edema or tenderness.  Lymphadenopathy:    He has no cervical adenopathy.  Neurological: He is alert and oriented to person, place, and time. He has normal reflexes. No cranial nerve deficit. He exhibits normal muscle tone. He displays a negative Romberg sign. Coordination and gait normal.  Skin: Skin is warm and dry. No rash noted.  Psychiatric: He has a normal mood and affect. His behavior is normal. Judgment and thought content normal.  facial bruises are better  Lab Results  Component Value Date   WBC 5.0 03/09/2018   HGB 11.7 (L) 03/09/2018   HCT 35.1 (L) 03/09/2018   PLT 130 (L) 03/09/2018   GLUCOSE 103 (H) 08/17/2018   CHOL 118 09/11/2016   TRIG 62.0 09/11/2016   HDL 49.60 09/11/2016   LDLCALC 56 09/11/2016   ALT 14 01/29/2018   AST 20 01/29/2018   NA 133 (L) 08/17/2018   K 4.6 08/17/2018   CL 99 08/17/2018   CREATININE 1.04 08/17/2018   BUN 14 08/17/2018   CO2 30 08/17/2018   TSH 1.57 01/29/2018   PSA 12.45 (H) 02/06/2016   INR 1.1 04/02/2017   HGBA1C 6.4 10/10/2017    Dg Facial Bones Complete  Result Date: 08/17/2018 CLINICAL DATA:  Fall on sidewalk 2 days ago with facial bruising. EXAM: FACIAL BONES COMPLETE 3+V COMPARISON:  None. FINDINGS:  There is no evidence of fracture or other significant bone abnormality. No orbital emphysema or sinus air-fluid levels are seen. IMPRESSION: Negative. Electronically Signed   By: Marin Olp M.D.   On: 08/17/2018 15:59    Assessment & Plan:   There are no diagnoses linked to this encounter.   No orders of the defined types were placed in this encounter.    Follow-up: No follow-ups on file.  Walker Kehr, MD

## 2018-09-16 NOTE — Assessment & Plan Note (Signed)
Bruising is resolving

## 2018-09-16 NOTE — Assessment & Plan Note (Signed)
Ref to Dr Loanne Drilling

## 2018-09-16 NOTE — Assessment & Plan Note (Signed)
10/19 Probable pacemaker cardiomyopathy per Dr [name] w/a plan to  1) INCREASE cozaar (losartan) 50 mg - take 1 tablet by mouth once daily 2) START mexiletine 150 mg- take 1 tablet by mouth TWICE daily

## 2018-09-16 NOTE — Assessment & Plan Note (Signed)
Lipitor 

## 2018-09-16 NOTE — Assessment & Plan Note (Signed)
F/u w/Dr Johnsie Cancel Atenolol, ASA, Plavix, Lipitor, Losartan

## 2018-09-16 NOTE — Assessment & Plan Note (Signed)
Rate controlled 

## 2018-09-16 NOTE — Assessment & Plan Note (Signed)
Losartan, Atenolol

## 2018-09-22 ENCOUNTER — Ambulatory Visit (INDEPENDENT_AMBULATORY_CARE_PROVIDER_SITE_OTHER): Payer: PPO | Admitting: Endocrinology

## 2018-09-22 ENCOUNTER — Encounter: Payer: Self-pay | Admitting: Endocrinology

## 2018-09-22 NOTE — Progress Notes (Signed)
Subjective:    Patient ID: [name] Christensen, male    DOB: 04-Apr-1932, 84 y.o.   MRN: [id number]  HPI Pt is referred by Dr Posey Rea, for hypercalcemia.  Pt was noted to have hypercalcemia in 2016 (it was normal in 2015). He has never had osteoporosis, urolithiasis, thyroid probs, parathyroid probs, sarcoidosis, cancer, PUD, pancreatitis, depression, or bony fracture.  He does not take vitamin-D or A supplements.  Pt denies taking antacids, Li++, or HCTZ.   He stopped taking vitamin-D, after recent blood test.   Past Medical History:  Diagnosis Date  . AICD (automatic cardioverter/defibrillator) present 04/07/2017  . Allergy   . Anxiety   . BPH (benign prostatic hypertrophy)    elevated PSA Dr. Lindell Noe Bx 2010  . Cardiac arrest [facility])    a. out-of-hospital arrest 11/18/2012 - EF 40-45%, patent grafts on cath, received [employer] AICD.  [name] Carotid disease, bilateral (HCC)    a. 0-39% by doppers.  . Cataract    bil cataracts removed  . CHF (congestive heart failure) (HCC)   . Coronary artery disease    a. s/p MI/CABG 2005. b. s/p cath at time of VF arrest 10/2013 - grafts patent.  . Cough   . Diverticulosis   . Elevated LFTs    a. 10/2012 felt due to cardiac arrest - Hepatitis C Ab reactive from 11/21/2012>>Hep C RNA PCR negative 11/14/2012.   [name] GERD (gastroesophageal reflux disease)   . Hyperlipidemia   . Hypertension   . Internal hemorrhoids   . Myocardial infarction (HCC)    2005 - CABG x 5 2005  . RBBB   . Tubular adenoma of colon   . Ventricular bigeminy    a. Event monitor 01/2013: NSR with PVCs and occ bigeminy.    Past Surgical History:  Procedure Laterality Date  . APPENDECTOMY    . CARDIAC CATHETERIZATION  2007   with patent graft anatomy atretic left internal mammary  artery to the LAD which is nonobstructive. Will restart study June 08, 2007  . CARDIOVERSION N/A 03/27/2018   Procedure: CARDIOVERSION;  Surgeon: Iran Ouch, MD;  Location: [facility] ORS;  Service:  Cardiovascular;  Laterality: N/A;  . CATARACT EXTRACTION W/PHACO Right 04/23/2016   Procedure: CATARACT EXTRACTION PHACO AND INTRAOCULAR LENS PLACEMENT (IOC);  Surgeon: [name], MD;  Location: [facility] ORS;  Service: Ophthalmology;  Laterality: Right;  [place] 1.09 AP% 19.3 CDE 13.38 Fluid pack lot # [id number]  . CHOLECYSTECTOMY N/A 06/05/2017   Procedure: LAPAROSCOPIC CHOLECYSTECTOMY WITH INTRAOPERATIVE CHOLANGIOGRAM ERAS PATHWAY POSSIBLE NEEDLE CORE BIOPSY OF LIVER;  Surgeon: [name], MD;  Location: [facility] OR;  Service: General;  Laterality: N/A;  ERAS PATHWAY  . COLONOSCOPY    . CORONARY ARTERY BYPASS GRAFT  2005  . EYE SURGERY    . ICD GENERATOR CHANGEOUT N/A 04/07/2017   Procedure: ICD Generator Changeout;  Surgeon: Duke Salvia, MD;  Location: [facility] INVASIVE CV LAB;  Service: Cardiovascular;  Laterality: N/A;  . IMPLANTABLE CARDIOVERTER DEFIBRILLATOR IMPLANT N/A 10/29/2012   STJ single chamber ICD implanted by Dr Graciela Husbands for cardiac arrest   . INGUINAL HERNIA REPAIR Bilateral 01/17/2015   Procedure: BILATERAL LAPAROSCOPIC INGUINAL HERNIA REPAIR WITH LEFT FEMORAL HERNIA REPAIR;  Surgeon: [name], MD;  Location: [facility] OR;  Service: General;  Laterality: Bilateral;  . INSERTION OF MESH Bilateral 01/17/2015   Procedure: INSERTION OF MESH;  Surgeon: [name], MD;  Location: [facility] OR;  Service: General;  Laterality: Bilateral;  . LAPAROSCOPIC CHOLECYSTECTOMY  06/05/2017  .  LEAD INSERTION N/A 04/07/2017   Procedure: RA Lead Insertion;  Surgeon: Duke Salvia, MD;  Location: [facility] INVASIVE CV LAB;  Service: Cardiovascular;  Laterality: N/A;  . LEAD REVISION/REPAIR N/A 04/08/2017   Procedure: Atrial Lead Revision/Repair;  Surgeon: Duke Salvia, MD;  Location: [facility] INVASIVE CV LAB;  Service: Cardiovascular;  Laterality: N/A;  . LEFT HEART CATHETERIZATION WITH CORONARY/GRAFT ANGIOGRAM N/A 10/30/2012   Procedure: LEFT HEART CATHETERIZATION WITH Isabel Caprice;  Surgeon: [name], MD;   Location: [facility] CATH LAB;  Service: Cardiovascular;  Laterality: N/A;  . LIVER BIOPSY N/A 06/05/2017   Procedure: SINGLE SITE LIVER BIOPSY ERAS PATHWAY;  Surgeon: [name], MD;  Location: [facility] OR;  Service: General;  Laterality: N/A;  ERAS PATHWAY  . LUMBAR FUSION  09/2007    Social History   Socioeconomic History  . Marital status: Married    Spouse name: Not on file  . Number of children: 5  . Years of education: Not on file  . Highest education level: Not on file  Occupational History  . 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 Financial resource strain: Not hard at all  . Food insecurity:    Worry: Never true    Inability: Never true  . Transportation needs:    Medical: No    Non-medical: No  Tobacco Use  . Smoking status: Never Smoker  . Smokeless tobacco: Never Used  Substance and Sexual Activity  . Alcohol use: No  . Drug use: No  . Sexual activity: Never  Lifestyle  . Physical activity:    Days per week: 6 days    Minutes per session: 50 min  . Stress: Not at all  Relationships  . Social connections:    Talks on phone: More than three times a week    Gets together: More than three times a week    Attends religious service: More than 4 times per year    Active member of club or organization: Yes    Attends meetings of clubs or organizations: More than 4 times per year    Relationship status: Married  . Intimate partner violence:    Fear of current or ex partner: No    Emotionally abused: No    Physically abused: No    Forced sexual activity: No  Other Topics Concern  . Not on file  Social History Narrative  . Not on file    Current Outpatient Medications on File Prior to Visit  Medication Sig Dispense Refill  . acetaminophen (TYLENOL) 325 MG tablet Take 325 mg by mouth 3 (three) times daily as needed for mild pain.    [name] alfuzosin (UROXATRAL) 10 MG 24 hr tablet Take 10 mg by mouth every evening.     [name] atorvastatin (LIPITOR) 20  MG tablet TAKE 1 TABLET BY MOUTH ONCE A DAY AT 6PM 90 tablet 1  . betamethasone dipropionate (DIPROLENE) 0.05 % cream Apply 1 application topically daily as needed.    . diazepam (VALIUM) 5 MG tablet Take 0.5-1 tablets (2.5-5 mg total) by mouth every 12 (twelve) hours as needed for anxiety. 30 tablet 2  . dutasteride (AVODART) 0.5 MG capsule Take 0.5 mg by mouth every evening.     . furosemide (LASIX) 20 MG tablet Take 2 tabs (40 mg) once daily, you may take an extra 2 tabs (40 mg) daily as needed for weight gain of 3 lbs or more in 24 hours 90 tablet 3  .  hydrocortisone (ANUSOL-HC) 2.5 % rectal cream bid (Patient taking differently: Place 1 application rectally 2 (two) times daily as needed for hemorrhoids or anal itching. ) 30 g 1  . linaclotide (LINZESS) 290 MCG CAPS capsule Take 1 capsule (290 mcg total) by mouth as needed. (Patient taking differently: Take 290 mcg by mouth daily as needed (for constipation). ) 30 capsule 11  . losartan (COZAAR) 50 MG tablet Take 1 tablet (50 mg total) by mouth daily.    . metoprolol succinate (TOPROL-XL) 25 MG 24 hr tablet Take 1 tablet (25 mg total) by mouth daily. 90 tablet 3  . mexiletine (MEXITIL) 150 MG capsule Take 1 capsule (150 mg total) by mouth 2 (two) times daily. 60 capsule 6  . Multiple Vitamin (MULTIVITAMIN WITH MINERALS) TABS Take 1 tablet by mouth daily.    . nitroGLYCERIN (NITROSTAT) 0.4 MG SL tablet PLACE ONE TABLET UNDER TONGUE EVERY 5 MINUTES AS NEEDED FOR CHEST PAIN (3 DOSES MAX) 25 tablet 0  . Polyethyl Glycol-Propyl Glycol (SYSTANE OP) Place 1 drop into both eyes daily as needed (for dry eyes).    . vitamin B-12 (CYANOCOBALAMIN) 500 MCG tablet Take 500 mcg by mouth daily.      No current facility-administered medications on file prior to visit.     Allergies  Allergen Reactions  . Ezetimibe Other (See Comments)    Pt was in coma for 6 days   numbness  . Penicillins Other (See Comments)    BLISTERS BETWEEN FINGERS  Has patient had  a PCN reaction causing immediate rash, facial/tongue/throat swelling, SOB or lightheadedness with hypotension: unknown Has patient had a PCN reaction causing severe rash involving mucus membranes or skin necrosis: unknown Has patient had a PCN reaction that required hospitalization no Has patient had a PCN reaction occurring within the last 10 years: no If all of the above answers are "NO", then may proceed with Cephalosporin use.   . Pravastatin Sodium Other (See Comments)    Aches and pains in joints  . Rosuvastatin Other (See Comments)    MYALGIAS  . Niacin Anxiety and Other (See Comments)    "Makes me feel nervous, jittery"    Family History  Problem Relation Age of Onset  . Coronary artery disease Mother   . Heart attack Brother   . Stomach cancer Neg Hx   . Colon cancer Neg Hx   . Esophageal cancer Neg Hx   . Pancreatic cancer Neg Hx   . Prostate cancer Neg Hx   . Rectal cancer Neg Hx   . Hypercalcemia Neg Hx     BP 122/64 (BP Location: Right Arm, Patient Position: Sitting, Cuff Size: Normal)   Pulse 83   Ht 5\' 7"  (1.702 m)   Wt 147 lb (66.7 kg)   SpO2 97%   BMI 23.02 kg/m    Review of Systems Denies weight loss, visual loss, cough, edema, diarrhea, sore throat, polyuria, hematuria, rash, depression, numbness, and back pain.  He has easy bruising.       Objective:   Physical Exam VS: see vs page GEN: no distress HEAD: head: no deformity eyes: no periorbital swelling, no proptosis external nose and ears are normal mouth: no lesion seen NECK: supple, thyroid is not enlarged CHEST WALL: no deformity.  No kyphosis.  pacemaker is noted. LUNGS: clear to auscultation CV: reg rate and rhythm, no murmur ABD: abdomen is soft, nontender.  no hepatosplenomegaly.  not distended.  no hernia MUSCULOSKELETAL: muscle bulk and strength are  grossly normal.  no obvious joint swelling.  gait is normal and steady EXTEMITIES: no deformity.  no edema PULSES: no carotid  bruit NEURO:  cn 2-12 grossly intact.   readily moves all 4's.  sensation is intact to touch on all 4's SKIN:  Normal texture and temperature.  No rash or suspicious lesion is visible.   NODES:  None palpable at the neck PSYCH: alert, well-oriented.  Does not appear anxious nor depressed.  Lab Results  Component Value Date   PTH 46 08/17/2018   CALCIUM 10.7 (H) 08/17/2018   CALCIUM 10.7 (H) 08/17/2018   CAION 1.40 (H) 04/06/2013   PHOS 2.6 11/18/2012   I have reviewed outside records, and summarized: Pt was noted to have elevated Ca++, and referred here.  Other probs addressed were dyslipidemia, HTN, and facial contusion.      Assessment & Plan:  Hypercalcemia, new to me, uncertain etiology  Patient Instructions  blood tests, and a 24-HR urine test, are requested for you today.  We'll let you know about the results. If these results are fine, Please come back for a follow-up appointment in 6 months.

## 2018-09-22 NOTE — Patient Instructions (Signed)
blood tests, and a 24-HR urine test, are requested for you today.  We'll let you know about the results. If these results are fine, Please come back for a follow-up appointment in 6 months.

## 2018-09-23 ENCOUNTER — Ambulatory Visit (INDEPENDENT_AMBULATORY_CARE_PROVIDER_SITE_OTHER): Payer: PPO | Admitting: Internal Medicine

## 2018-09-23 ENCOUNTER — Encounter: Payer: Self-pay | Admitting: Internal Medicine

## 2018-09-23 VITALS — BP 126/64 | HR 84 | Temp 97.8°F | Ht 67.0 in | Wt 147.0 lb

## 2018-09-23 DIAGNOSIS — I5032 Chronic diastolic (congestive) heart failure: Secondary | ICD-10-CM

## 2018-09-23 DIAGNOSIS — J479 Bronchiectasis, uncomplicated: Secondary | ICD-10-CM | POA: Diagnosis not present

## 2018-09-23 DIAGNOSIS — J069 Acute upper respiratory infection, unspecified: Secondary | ICD-10-CM

## 2018-09-23 MED ORDER — DOXYCYCLINE HYCLATE 100 MG PO TABS: 100.0000 mg | ORAL_TABLET | Freq: Two times a day (BID) | ORAL | 0 refills | Status: DC

## 2018-09-23 MED ORDER — PROMETHAZINE-CODEINE 6.25-10 MG/5ML PO SYRP: 5.0000 mL | ORAL_SOLUTION | ORAL | 0 refills | Status: DC | PRN

## 2018-09-23 MED ORDER — APIXABAN 5 MG PO TABS: 5.0000 mg | ORAL_TABLET | Freq: Two times a day (BID) | ORAL | 3 refills | Status: DC

## 2018-09-23 NOTE — Progress Notes (Signed)
Subjective:  Patient ID: [name] Christensen, male    DOB: 10/21/1932  Age: 84 y.o. MRN: [id number]  CC: No chief complaint on file.   HPI ZAVON HYSON presents for URI sx's since Mon: achy, cough - productive for yellow mucus. No CP  Outpatient Medications Prior to Visit  Medication Sig Dispense Refill  . acetaminophen (TYLENOL) 325 MG tablet Take 325 mg by mouth 3 (three) times daily as needed for mild pain.    [name] alfuzosin (UROXATRAL) 10 MG 24 hr tablet Take 10 mg by mouth every evening.     [name] apixaban (ELIQUIS) 5 MG TABS tablet Take 1 tablet (5 mg total) by mouth 2 (two) times daily. 60 tablet 11  . atorvastatin (LIPITOR) 20 MG tablet TAKE 1 TABLET BY MOUTH ONCE A DAY AT 6PM 90 tablet 1  . betamethasone dipropionate (DIPROLENE) 0.05 % cream Apply 1 application topically daily as needed.    . diazepam (VALIUM) 5 MG tablet Take 0.5-1 tablets (2.5-5 mg total) by mouth every 12 (twelve) hours as needed for anxiety. 30 tablet 2  . dutasteride (AVODART) 0.5 MG capsule Take 0.5 mg by mouth every evening.     . furosemide (LASIX) 20 MG tablet Take 2 tabs (40 mg) once daily, you may take an extra 2 tabs (40 mg) daily as needed for weight gain of 3 lbs or more in 24 hours 90 tablet 3  . hydrocortisone (ANUSOL-HC) 2.5 % rectal cream bid (Patient taking differently: Place 1 application rectally 2 (two) times daily as needed for hemorrhoids or anal itching. ) 30 g 1  . linaclotide (LINZESS) 290 MCG CAPS capsule Take 1 capsule (290 mcg total) by mouth as needed. (Patient taking differently: Take 290 mcg by mouth daily as needed (for constipation). ) 30 capsule 11  . losartan (COZAAR) 50 MG tablet Take 1 tablet (50 mg total) by mouth daily.    . metoprolol succinate (TOPROL-XL) 25 MG 24 hr tablet Take 1 tablet (25 mg total) by mouth daily. 90 tablet 3  . mexiletine (MEXITIL) 150 MG capsule Take 1 capsule (150 mg total) by mouth 2 (two) times daily. 60 capsule 6  . Multiple Vitamin (MULTIVITAMIN WITH  MINERALS) TABS Take 1 tablet by mouth daily.    . nitroGLYCERIN (NITROSTAT) 0.4 MG SL tablet PLACE ONE TABLET UNDER TONGUE EVERY 5 MINUTES AS NEEDED FOR CHEST PAIN (3 DOSES MAX) 25 tablet 0  . Polyethyl Glycol-Propyl Glycol (SYSTANE OP) Place 1 drop into both eyes daily as needed (for dry eyes).    . vitamin B-12 (CYANOCOBALAMIN) 500 MCG tablet Take 500 mcg by mouth daily.      No facility-administered medications prior to visit.     ROS: Review of Systems  Constitutional: Positive for chills and fatigue. Negative for appetite change and unexpected weight change.  HENT: Positive for congestion, postnasal drip, rhinorrhea and voice change. Negative for nosebleeds, sneezing, sore throat and trouble swallowing.   Eyes: Negative for itching and visual disturbance.  Respiratory: Positive for cough. Negative for shortness of breath and wheezing.   Cardiovascular: Negative for chest pain, palpitations and leg swelling.  Gastrointestinal: Negative for abdominal distention, blood in stool, diarrhea and nausea.  Genitourinary: Negative for frequency and hematuria.  Musculoskeletal: Negative for back pain, gait problem, joint swelling and neck pain.  Skin: Negative for rash.  Neurological: Negative for dizziness, tremors, speech difficulty and weakness.  Psychiatric/Behavioral: Negative for agitation, dysphoric mood and sleep disturbance. The patient is not nervous/anxious.  eryth throat  Objective:  BP 126/64 (BP Location: Left Arm, Patient Position: Sitting, Cuff Size: Normal)   Pulse 84   Temp 97.8 F (36.6 C) (Oral)   Ht 5\' 7"  (1.702 m)   Wt 147 lb (66.7 kg)   SpO2 96%   BMI 23.02 kg/m   BP Readings from Last 3 Encounters:  09/23/18 126/64  09/22/18 122/64  09/16/18 114/62    Wt Readings from Last 3 Encounters:  09/23/18 147 lb (66.7 kg)  09/22/18 147 lb (66.7 kg)  09/16/18 145 lb (65.8 kg)    Physical Exam  Constitutional: He is oriented to person, place, and time. He  appears well-developed. No distress.  NAD  HENT:  Mouth/Throat: Oropharynx is clear and moist.  Eyes: Pupils are equal, round, and reactive to light. Conjunctivae are normal.  Neck: Normal range of motion. No JVD present. No thyromegaly present.  Cardiovascular: Normal rate, regular rhythm, normal heart sounds and intact distal pulses. Exam reveals no gallop and no friction rub.  No murmur heard. Pulmonary/Chest: Effort normal and breath sounds normal. No respiratory distress. He has no wheezes. He has no rales. He exhibits no tenderness.  Abdominal: Soft. Bowel sounds are normal. He exhibits no distension and no mass. There is no tenderness. There is no rebound and no guarding.  Musculoskeletal: Normal range of motion. He exhibits no edema or tenderness.  Lymphadenopathy:    He has no cervical adenopathy.  Neurological: He is alert and oriented to person, place, and time. He has normal reflexes. No cranial nerve deficit. He exhibits normal muscle tone. He displays a negative Romberg sign. Coordination and gait normal.  Skin: Skin is warm and dry. No rash noted.  Psychiatric: He has a normal mood and affect. His behavior is normal. Judgment and thought content normal.  eryth throat coughing  Lab Results  Component Value Date   WBC 5.0 03/09/2018   HGB 11.7 (L) 03/09/2018   HCT 35.1 (L) 03/09/2018   PLT 130 (L) 03/09/2018   GLUCOSE 103 (H) 08/17/2018   CHOL 118 09/11/2016   TRIG 62.0 09/11/2016   HDL 49.60 09/11/2016   LDLCALC 56 09/11/2016   ALT 14 01/29/2018   AST 20 01/29/2018   NA 133 (L) 08/17/2018   K 4.6 08/17/2018   CL 99 08/17/2018   CREATININE 1.04 08/17/2018   BUN 14 08/17/2018   CO2 30 08/17/2018   TSH 1.57 01/29/2018   PSA 12.45 (H) 02/06/2016   INR 1.1 04/02/2017   HGBA1C 6.4 10/10/2017    Dg Facial Bones Complete  Result Date: 08/17/2018 CLINICAL DATA:  Fall on sidewalk 2 days ago with facial bruising. EXAM: FACIAL BONES COMPLETE 3+V COMPARISON:  None.  FINDINGS: There is no evidence of fracture or other significant bone abnormality. No orbital emphysema or sinus air-fluid levels are seen. IMPRESSION: Negative. Electronically Signed   By: Marin Olp M.D.   On: 08/17/2018 15:59    Assessment & Plan:   There are no diagnoses linked to this encounter.   No orders of the defined types were placed in this encounter.    Follow-up: No follow-ups on file.  Walker Kehr, MD

## 2018-09-23 NOTE — Assessment & Plan Note (Signed)
Doxy po, MDI if worse

## 2018-09-23 NOTE — Assessment & Plan Note (Signed)
OTC meds Doxy if worse Prom-cod syr

## 2018-09-23 NOTE — Assessment & Plan Note (Signed)
Compensated. No CHF sx's today

## 2018-09-23 NOTE — Patient Instructions (Signed)
You can use over-the-counter  "cold" medicines  such as "Afrin" nasal spray for nasal congestion as directed. Use " Delsym" or" Robitussin" cough syrup varietis for cough.  You can use plain "Tylenol" or "Advil" for fever, chills and achyness. Use Halls or Ricola cough drops.   "Common cold" symptoms are usually triggered by a virus.  The antibiotics are usually not necessary. On average, a" viral cold" illness would take 4-7 days to resolve.   Please, make an appointment if you are not better or if you're worse.  

## 2018-09-24 ENCOUNTER — Other Ambulatory Visit: Payer: Self-pay

## 2018-09-25 LAB — CALCIUM, URINE, 24 HOUR: Calcium, 24H Urine: 126 mg/24 h

## 2018-09-28 ENCOUNTER — Other Ambulatory Visit: Payer: Self-pay | Admitting: Internal Medicine

## 2018-09-29 LAB — VITAMIN A: Vitamin A (Retinoic Acid): 32 ug/dL — ABNORMAL LOW (ref 38–98)

## 2018-09-29 LAB — VITAMIN D 1,25 DIHYDROXY
Vitamin D 1, 25 (OH)2 Total: 36 pg/mL (ref 18–72)
Vitamin D2 1, 25 (OH)2: <8 pg/mL
Vitamin D3 1, 25 (OH)2: 36 pg/mL

## 2018-09-29 LAB — PROTEIN ELECTROPHORESIS, SERUM
Albumin ELP: 4 g/dL (ref 3.8–4.8)
Alpha 1: 0.3 g/dL (ref 0.2–0.3)
Alpha 2: 0.6 g/dL (ref 0.5–0.9)
Beta 2: 0.4 g/dL (ref 0.2–0.5)
Beta Globulin: 0.5 g/dL (ref 0.4–0.6)
Gamma Globulin: 1.5 g/dL (ref 0.8–1.7)
Total Protein: 7.3 g/dL (ref 6.1–8.1)

## 2018-09-29 LAB — PTH, INTACT AND CALCIUM
Calcium: 10.7 mg/dL — ABNORMAL HIGH (ref 8.6–10.3)
PTH: 53 pg/mL (ref 14–64)

## 2018-09-29 LAB — PTH-RELATED PEPTIDE: PTH-Related Protein (PTH-RP): 13 pg/mL — ABNORMAL LOW (ref 14–27)

## 2018-10-06 ENCOUNTER — Ambulatory Visit (INDEPENDENT_AMBULATORY_CARE_PROVIDER_SITE_OTHER): Payer: PPO | Admitting: *Deleted

## 2018-10-06 DIAGNOSIS — I469 Cardiac arrest, cause unspecified: Secondary | ICD-10-CM | POA: Diagnosis not present

## 2018-10-06 DIAGNOSIS — I5022 Chronic systolic (congestive) heart failure: Secondary | ICD-10-CM

## 2018-10-06 NOTE — Progress Notes (Signed)
Remote ICD transmission.   

## 2018-10-20 LAB — CUP PACEART REMOTE DEVICE CHECK
Battery Remaining Longevity: 62 mo
Battery Remaining Percentage: 76 %
Battery Voltage: 2.96 V
Brady Statistic AP VP Percent: 70 %
Brady Statistic AP VS Percent: 17 %
Brady Statistic AS VP Percent: 4.6 %
Brady Statistic AS VS Percent: 7.6 %
Brady Statistic RA Percent Paced: 87 %
Brady Statistic RV Percent Paced: 75 %
Date Time Interrogation Session: 20191112070018
HighPow Impedance: 65 Ohm
HighPow Impedance: 65 Ohm
Implantable Lead Implant Date: 20131218
Implantable Lead Implant Date: 20180514
Implantable Lead Location: 753859
Implantable Lead Location: 753860
Implantable Lead Model: 5076
Implantable Pulse Generator Implant Date: 20180514
Lead Channel Impedance Value: 450 Ohm
Lead Channel Impedance Value: 480 Ohm
Lead Channel Pacing Threshold Amplitude: 0.75 V
Lead Channel Pacing Threshold Amplitude: 1.25 V
Lead Channel Pacing Threshold Pulse Width: 0.5 ms
Lead Channel Pacing Threshold Pulse Width: 0.5 ms
Lead Channel Sensing Intrinsic Amplitude: 12 mV
Lead Channel Sensing Intrinsic Amplitude: 3.1 mV
Lead Channel Setting Pacing Amplitude: 2 V
Lead Channel Setting Pacing Amplitude: 2.5 V
Lead Channel Setting Pacing Pulse Width: 0.5 ms
Lead Channel Setting Sensing Sensitivity: 0.5 mV
Pulse Gen Serial Number: 1245762

## 2018-11-30 ENCOUNTER — Other Ambulatory Visit: Payer: Self-pay | Admitting: *Deleted

## 2018-11-30 DIAGNOSIS — I4819 Other persistent atrial fibrillation: Secondary | ICD-10-CM

## 2018-11-30 DIAGNOSIS — I493 Ventricular premature depolarization: Secondary | ICD-10-CM

## 2018-11-30 NOTE — Progress Notes (Signed)
Echo order

## 2018-12-18 ENCOUNTER — Encounter

## 2018-12-18 ENCOUNTER — Other Ambulatory Visit: Payer: Self-pay

## 2018-12-18 ENCOUNTER — Ambulatory Visit (INDEPENDENT_AMBULATORY_CARE_PROVIDER_SITE_OTHER): Payer: Medicare Other

## 2018-12-18 DIAGNOSIS — I4819 Other persistent atrial fibrillation: Secondary | ICD-10-CM

## 2018-12-18 DIAGNOSIS — I493 Ventricular premature depolarization: Secondary | ICD-10-CM

## 2018-12-22 ENCOUNTER — Telehealth: Payer: Self-pay | Admitting: Internal Medicine

## 2018-12-22 DIAGNOSIS — I493 Ventricular premature depolarization: Secondary | ICD-10-CM

## 2018-12-22 NOTE — Telephone Encounter (Signed)
I spoke with the patient regarding his echo results. He is aware of Dr. Olin Pia recommendations to wear a Zio monitor prior to his follow up on 2/25 to quantitate his PVC burden.  Clarified with Dr. [name] that he would like the patient to wear this for 1 week.  The patient voices understanding of the above results and recommendations and is agreeable with wearing a 1 week ZIO monitor.  I advised the patient I will order the monitor and have scheduling call him to arrange an appointment to come in and have this placed.

## 2018-12-22 NOTE — Telephone Encounter (Signed)
Notes recorded by Deboraha Sprang, MD on 12/21/2018 at 9:16 AM EST Please Inform Patient Echo showed unchanged but stable heart muscle function  We will discuss next steps at next office visit, Can you plz arrange zzio patch to quantitate PVCs They are not accurately counted by his ICD  thnks

## 2018-12-23 ENCOUNTER — Telehealth: Payer: Self-pay

## 2018-12-23 NOTE — Telephone Encounter (Signed)
-----   Message from Emily Filbert, RN sent at 12/22/2018  5:00 PM EST ----- Please call the patient to arrange to come in and have a 1 week ZIO (XT) monitor placed Dx- PVC's  He is aware he will be called to schedule.  Thanks!

## 2018-12-23 NOTE — Telephone Encounter (Signed)
Scheduled 2/6 at 9:00a

## 2018-12-23 NOTE — Telephone Encounter (Signed)
lmov to schedule appt  °

## 2018-12-29 ENCOUNTER — Encounter: Payer: Self-pay | Admitting: Internal Medicine

## 2018-12-29 DIAGNOSIS — Z01812 Encounter for preprocedural laboratory examination: Secondary | ICD-10-CM

## 2018-12-29 DIAGNOSIS — I4819 Other persistent atrial fibrillation: Secondary | ICD-10-CM

## 2018-12-29 NOTE — Progress Notes (Signed)
I called and spoke with the patient. I have advised him that per his remote transmission, he has been in atrial flutter since 12/27/18. The patient stated that he thought he could feel something was different.  I have advised the patient that Dr. [name] has recommended we 1) Cancel his ZIO for now 2) plan for a Cardioversion with ZIO placement afterwards.  The patient voices understanding. I advised him the first date for [facility] is 01/04/19. I did call [facility] and the 1st date for [facility] is also 01/04/19.  I have scheduled the patient for a DCCV to be done at [facility].  You are scheduled for a Cardioversion on Monday 01/04/19 with Dr. Fletcher Anon.  Please arrive at the [place] [facility] at 6:30 a.m. on the day of your procedure (1st desk on the right to register).  DIET INSTRUCTIONS:  Nothing to eat or drink after midnight the night prior to your procedure.      1) Labs: anytime between Wednesday 2/5 & Friday 2/7- please go to the [place]- 1st desk on the right to register (you do not have to be fasting for labs)  2) Medications:  You may take all of your regular medications with enough water to get them down safely the morning of your procedure unless listed below:  - HOLD lasix (furosemide) the morning of your procedure  3) Must have a responsible person to drive you home.  4) Bring a current list of your medications and current insurance cards.    If you have any questions after you get home, please call the office at [phone] (Alvis Lemmings, RN, BSN  I have notified the patient of the above instructions and he voices understanding. Copy of instructions sent to the patient via MyChart as well.   I have advised the patient I will reschedule his ZIO to be place on Monday 2/10 around 9:00 am so he can come down after the DCCV to have this done.  The patient voices understanding and is agreeable.

## 2018-12-29 NOTE — Progress Notes (Signed)
Merlin alert received for AF burden > threshold.  Pt in ongoing atrial flutter since 12/27/18. [place] - Eliquis. Zio monitor scheduled for 2/6 to quantify PVC burden. Will route to Heather/Dr [name] to see if this should be postponed.  Chanetta Marshall, NP 12/29/2018 5:54 AM

## 2018-12-29 NOTE — Progress Notes (Signed)
Please schedule cardioversion and then apply Zio

## 2018-12-29 NOTE — Addendum Note (Signed)
Addended by: Alvis Lemmings C on: 12/29/2018 04:43 PM   Modules accepted: Orders

## 2018-12-30 ENCOUNTER — Other Ambulatory Visit
Admission: RE | Admit: 2018-12-30 | Discharge: 2018-12-30 | Disposition: A | Discharge status: Home / Self Care | Payer: Medicare Other | Source: Ambulatory Visit | Attending: Internal Medicine | Admitting: Internal Medicine

## 2018-12-30 DIAGNOSIS — Z01812 Encounter for preprocedural laboratory examination: Secondary | ICD-10-CM | POA: Diagnosis not present

## 2018-12-30 DIAGNOSIS — I4819 Other persistent atrial fibrillation: Secondary | ICD-10-CM

## 2018-12-30 LAB — CBC WITH DIFFERENTIAL/PLATELET
Abs Immature Granulocytes: 0.01 10*3/uL (ref 0.00–0.07)
Basophils Absolute: 0 10*3/uL (ref 0.0–0.1)
Basophils Relative: 0 %
Eosinophils Absolute: 0.1 10*3/uL (ref 0.0–0.5)
Eosinophils Relative: 3 %
HCT: 38.8 % — ABNORMAL LOW (ref 39.0–52.0)
Hemoglobin: 12.5 g/dL — ABNORMAL LOW (ref 13.0–17.0)
Immature Granulocytes: 0 %
Lymphocytes Relative: 32 %
Lymphs Abs: 1.5 10*3/uL (ref 0.7–4.0)
MCH: 29.5 pg (ref 26.0–34.0)
MCHC: 32.2 g/dL (ref 30.0–36.0)
MCV: 91.5 fL (ref 80.0–100.0)
Monocytes Absolute: 0.5 10*3/uL (ref 0.1–1.0)
Monocytes Relative: 11 %
Neutro Abs: 2.5 10*3/uL (ref 1.7–7.7)
Neutrophils Relative %: 54 %
Platelets: 83 10*3/uL — ABNORMAL LOW (ref 150–400)
RBC: 4.24 MIL/uL (ref 4.22–5.81)
RDW: 14.8 % (ref 11.5–15.5)
WBC: 4.7 10*3/uL (ref 4.0–10.5)
nRBC: 0 % (ref 0.0–0.2)

## 2018-12-30 LAB — BASIC METABOLIC PANEL
Anion gap: 3 — ABNORMAL LOW (ref 5–15)
BUN: 17 mg/dL (ref 8–23)
CO2: 28 mmol/L (ref 22–32)
Calcium: 10.2 mg/dL (ref 8.9–10.3)
Chloride: 104 mmol/L (ref 98–111)
Creatinine, Ser: 1.15 mg/dL (ref 0.61–1.24)
GFR calc Af Amer: >60 mL/min (ref >60)
GFR calc non Af Amer: 58 mL/min — ABNORMAL LOW (ref >60)
Glucose, Bld: 103 mg/dL — ABNORMAL HIGH (ref 70–99)
Potassium: 4.4 mmol/L (ref 3.5–5.1)
Sodium: 135 mmol/L (ref 135–145)

## 2018-12-31 ENCOUNTER — Other Ambulatory Visit: Payer: Self-pay | Admitting: Internal Medicine

## 2019-01-02 ENCOUNTER — Other Ambulatory Visit: Payer: Self-pay | Admitting: Internal Medicine

## 2019-01-02 ENCOUNTER — Encounter: Payer: Self-pay | Admitting: Anesthesiology

## 2019-01-04 ENCOUNTER — Encounter
Admission: RE | Disposition: A | Discharge status: Home / Self Care | Payer: Self-pay | Source: Home / Self Care | Attending: Cardiovascular Disease

## 2019-01-04 ENCOUNTER — Ambulatory Visit: Payer: Medicare Other | Admitting: Anesthesiology

## 2019-01-04 ENCOUNTER — Ambulatory Visit (INDEPENDENT_AMBULATORY_CARE_PROVIDER_SITE_OTHER): Payer: Medicare Other

## 2019-01-04 ENCOUNTER — Other Ambulatory Visit: Payer: Self-pay | Admitting: Internal Medicine

## 2019-01-04 ENCOUNTER — Ambulatory Visit
Admission: RE | Admit: 2019-01-04 | Discharge: 2019-01-04 | Disposition: A | Discharge status: Home / Self Care | Payer: Medicare Other | Attending: Cardiovascular Disease | Admitting: Cardiovascular Disease

## 2019-01-04 ENCOUNTER — Other Ambulatory Visit: Payer: Self-pay

## 2019-01-04 DIAGNOSIS — I484 Atypical atrial flutter: Secondary | ICD-10-CM

## 2019-01-04 DIAGNOSIS — I252 Old myocardial infarction: Secondary | ICD-10-CM | POA: Diagnosis not present

## 2019-01-04 DIAGNOSIS — I5023 Acute on chronic systolic (congestive) heart failure: Secondary | ICD-10-CM | POA: Insufficient documentation

## 2019-01-04 DIAGNOSIS — Z951 Presence of aortocoronary bypass graft: Secondary | ICD-10-CM | POA: Insufficient documentation

## 2019-01-04 DIAGNOSIS — I5032 Chronic diastolic (congestive) heart failure: Secondary | ICD-10-CM | POA: Diagnosis not present

## 2019-01-04 DIAGNOSIS — I110 Hypertensive heart disease with heart failure: Secondary | ICD-10-CM | POA: Diagnosis not present

## 2019-01-04 DIAGNOSIS — Z79899 Other long term (current) drug therapy: Secondary | ICD-10-CM | POA: Insufficient documentation

## 2019-01-04 DIAGNOSIS — Z7901 Long term (current) use of anticoagulants: Secondary | ICD-10-CM | POA: Diagnosis not present

## 2019-01-04 DIAGNOSIS — N400 Enlarged prostate without lower urinary tract symptoms: Secondary | ICD-10-CM | POA: Diagnosis not present

## 2019-01-04 DIAGNOSIS — I4510 Unspecified right bundle-branch block: Secondary | ICD-10-CM | POA: Insufficient documentation

## 2019-01-04 DIAGNOSIS — Z95810 Presence of automatic (implantable) cardiac defibrillator: Secondary | ICD-10-CM | POA: Insufficient documentation

## 2019-01-04 DIAGNOSIS — R001 Bradycardia, unspecified: Secondary | ICD-10-CM | POA: Diagnosis not present

## 2019-01-04 DIAGNOSIS — E785 Hyperlipidemia, unspecified: Secondary | ICD-10-CM | POA: Diagnosis not present

## 2019-01-04 DIAGNOSIS — I480 Paroxysmal atrial fibrillation: Secondary | ICD-10-CM | POA: Diagnosis not present

## 2019-01-04 DIAGNOSIS — I2510 Atherosclerotic heart disease of native coronary artery without angina pectoris: Secondary | ICD-10-CM | POA: Diagnosis not present

## 2019-01-04 DIAGNOSIS — I4892 Unspecified atrial flutter: Secondary | ICD-10-CM | POA: Insufficient documentation

## 2019-01-04 DIAGNOSIS — F419 Anxiety disorder, unspecified: Secondary | ICD-10-CM | POA: Diagnosis not present

## 2019-01-04 DIAGNOSIS — K219 Gastro-esophageal reflux disease without esophagitis: Secondary | ICD-10-CM | POA: Diagnosis not present

## 2019-01-04 DIAGNOSIS — I493 Ventricular premature depolarization: Secondary | ICD-10-CM

## 2019-01-04 DIAGNOSIS — I255 Ischemic cardiomyopathy: Secondary | ICD-10-CM | POA: Diagnosis not present

## 2019-01-04 DIAGNOSIS — I4819 Other persistent atrial fibrillation: Secondary | ICD-10-CM | POA: Diagnosis not present

## 2019-01-04 HISTORY — PX: CARDIOVERSION: EP1203

## 2019-01-04 SURGERY — CARDIOVERSION (CATH LAB)
Anesthesia: General

## 2019-01-04 MED ORDER — MIDAZOLAM HCL 2 MG/2ML IJ SOLN
INTRAMUSCULAR | Status: AC
  Filled 2019-01-04: qty 2

## 2019-01-04 MED ORDER — KETAMINE HCL 10 MG/ML IJ SOLN
INTRAMUSCULAR | Status: DC | PRN
  Administered 2019-01-04 (×2): 10 mg via INTRAVENOUS

## 2019-01-04 MED ORDER — KETAMINE HCL 50 MG/ML IJ SOLN
INTRAMUSCULAR | Status: AC
  Filled 2019-01-04: qty 10

## 2019-01-04 MED ORDER — SODIUM CHLORIDE 0.9 % IV SOLN
INTRAVENOUS | Status: DC
  Administered 2019-01-04: 1000 mL via INTRAVENOUS

## 2019-01-04 MED ORDER — PROPOFOL 10 MG/ML IV BOLUS
INTRAVENOUS | Status: AC
  Filled 2019-01-04: qty 20

## 2019-01-04 MED ORDER — MIDAZOLAM HCL 2 MG/2ML IJ SOLN
INTRAMUSCULAR | Status: DC | PRN
  Administered 2019-01-04: 1 mg via INTRAVENOUS

## 2019-01-04 NOTE — CV Procedure (Signed)
Cardioversion note: A standard informed consent was obtained. Timeout was performed. The pads were placed in the anterior posterior fashion. The patient was given propofol by the anesthesia team.  Successful cardioversion was performed with a 150 J. The patient converted to AV paced rhythm initially followed by sinus rhythm with PVCs Pre-and post EKGs were reviewed. The patient tolerated the procedure with no immediate complications.  Recommendations: Continue follow-up with Dr. [name] as planned.  No change in medications.

## 2019-01-04 NOTE — H&P (Signed)
2015     Patient Care Team: Plotnikov, Georgina Quint, MD as PCP - General (Internal Medicine) Wendall Stade, MD as PCP - Cardiology (Cardiology) Duke Salvia, MD as PCP - Electrophysiology (Cardiology) Lindell Noe Lehman Prom., MD (Urology) [name], MD as Consulting Physician (General Surgery) Pyrtle, Carie Caddy, MD as Consulting Physician (Gastroenterology)   HPI  [name] Christensen is a 85 y.o. male Seen follow up for ICD implantation-single chamber undertaken for aborted cardiac arrest in the setting of ischemic heart disease with prior bypass surgery. He also has a history of frequent PVCs  Catheterization 12/13 demonstrating patent grafts with an atretic LIMA; LV function was normal S/p CABG    DATE TEST PVC  1/15 Holter 13%  1/18 Holter 31%     DATE TEST EF   12/13 Cath   Patent Grafts  2/15 Echo   45-50 %   12/17 Echo   50-55   4/19 Echo 20-25%   10/19 Echo  25-30%     Date Cr K Hgb  5/18 0.97  12.2  7/18 1.31  10.5  12.18  1.00 4.7 12.2  4/19 1.1 4.4 11.7   2/19 device interrogation had demonstrated recurrent episodes of "self terminating atrial fibrillation."  His device was reprogrammed to DDIR to avoid tracking  PVC burden prompted amio for suppression; also for Afib for which he underwent DCCV 5/19  He was intolerant and amio was discontinued.    Modest exercise intolerance.  Mostly fatigue without specific shortness of breath.  No edema.  Took a fall.         Past Medical History:  Diagnosis Date  . AICD (automatic cardioverter/defibrillator) present 04/07/2017  . Allergy   . Anxiety   . BPH (benign prostatic hypertrophy)    elevated PSA Dr. Lindell Noe Bx 2010  . Cardiac arrest [facility])    a. out-of-hospital arrest 10/27/2012 - EF 40-45%, patent grafts on cath, received St. Jude AICD.  [name] Carotid disease, bilateral (HCC)    a. 0-39% by doppers.  . Cataract    bil cataracts removed  . CHF (congestive heart  failure) (HCC)   . Coronary artery disease    a. s/p MI/CABG 2005. b. s/p cath at time of VF arrest 10/2013 - grafts patent.  . Cough   . Diverticulosis   . Elevated LFTs    a. 10/2012 felt due to cardiac arrest - Hepatitis C Ab reactive from 11/15/2012>>Hep C RNA PCR negative 10/30/2012.   [name] GERD (gastroesophageal reflux disease)   . Hyperlipidemia   . Hypertension   . Internal hemorrhoids   . Myocardial infarction (HCC)    2005 - CABG x 5 2005  . RBBB   . Tubular adenoma of colon   . Ventricular bigeminy    a. Event monitor 01/2013: NSR with PVCs and occ bigeminy.         Past Surgical History:  Procedure Laterality Date  . APPENDECTOMY    . CARDIAC CATHETERIZATION  2007   with patent graft anatomy atretic left internal mammary  artery to the LAD which is nonobstructive. Will restart study June 08, 2007  . CARDIOVERSION N/A 03/27/2018   Procedure: CARDIOVERSION;  Surgeon: Iran Ouch, MD;  Location: [facility] ORS;  Service: Cardiovascular;  Laterality: N/A;  . CATARACT EXTRACTION W/PHACO Right 04/23/2016   Procedure: CATARACT EXTRACTION PHACO AND INTRAOCULAR LENS PLACEMENT (IOC);  Surgeon: [name], MD;  Location: [facility] ORS;  Service: Ophthalmology;  Laterality: Right;  [place] 1.09 AP%  19.3 CDE 13.38 Fluid pack lot # [id number]  . CHOLECYSTECTOMY N/A 06/05/2017   Procedure: LAPAROSCOPIC CHOLECYSTECTOMY WITH INTRAOPERATIVE CHOLANGIOGRAM ERAS PATHWAY POSSIBLE NEEDLE CORE BIOPSY OF LIVER;  Surgeon: [name], MD;  Location: [facility] OR;  Service: General;  Laterality: N/A;  ERAS PATHWAY  . COLONOSCOPY    . CORONARY ARTERY BYPASS GRAFT  2005  . EYE SURGERY    . ICD GENERATOR CHANGEOUT N/A 04/07/2017   Procedure: ICD Generator Changeout;  Surgeon: Duke Salvia, MD;  Location: [facility] INVASIVE CV LAB;  Service: Cardiovascular;  Laterality: N/A;  . IMPLANTABLE CARDIOVERTER DEFIBRILLATOR IMPLANT N/A 11/17/2012   STJ single chamber ICD implanted by Dr Graciela Husbands for  cardiac arrest   . INGUINAL HERNIA REPAIR Bilateral 01/17/2015   Procedure: BILATERAL LAPAROSCOPIC INGUINAL HERNIA REPAIR WITH LEFT FEMORAL HERNIA REPAIR;  Surgeon: [name], MD;  Location: [facility] OR;  Service: General;  Laterality: Bilateral;  . INSERTION OF MESH Bilateral 01/17/2015   Procedure: INSERTION OF MESH;  Surgeon: [name], MD;  Location: [facility] OR;  Service: General;  Laterality: Bilateral;  . LAPAROSCOPIC CHOLECYSTECTOMY  06/05/2017  . LEAD INSERTION N/A 04/07/2017   Procedure: RA Lead Insertion;  Surgeon: Duke Salvia, MD;  Location: [facility] INVASIVE CV LAB;  Service: Cardiovascular;  Laterality: N/A;  . LEAD REVISION/REPAIR N/A 04/08/2017   Procedure: Atrial Lead Revision/Repair;  Surgeon: Duke Salvia, MD;  Location: [facility] INVASIVE CV LAB;  Service: Cardiovascular;  Laterality: N/A;  . LEFT HEART CATHETERIZATION WITH CORONARY/GRAFT ANGIOGRAM N/A 11/14/2012   Procedure: LEFT HEART CATHETERIZATION WITH Isabel Caprice;  Surgeon: [name], MD;  Location: [facility] CATH LAB;  Service: Cardiovascular;  Laterality: N/A;  . LIVER BIOPSY N/A 06/05/2017   Procedure: SINGLE SITE LIVER BIOPSY ERAS PATHWAY;  Surgeon: [name], MD;  Location: [facility] OR;  Service: General;  Laterality: N/A;  ERAS PATHWAY  . LUMBAR FUSION  09/2007          Current Outpatient Medications  Medication Sig Dispense Refill  . acetaminophen (TYLENOL) 325 MG tablet Take 325 mg by mouth 3 (three) times daily as needed for mild pain.    [name] alfuzosin (UROXATRAL) 10 MG 24 hr tablet Take 10 mg by mouth every evening.     [name] apixaban (ELIQUIS) 5 MG TABS tablet Take 1 tablet (5 mg total) by mouth 2 (two) times daily. 60 tablet 11  . atorvastatin (LIPITOR) 20 MG tablet TAKE 1 TABLET BY MOUTH ONCE A DAY AT 6PM 90 tablet 1  . betamethasone dipropionate (DIPROLENE) 0.05 % cream Apply 1 application topically daily as needed.    . Cholecalciferol (VITAMIN D3) 400 units CAPS Take 400 Units by mouth daily.      . diazepam (VALIUM) 5 MG tablet Take 0.5-1 tablets (2.5-5 mg total) by mouth every 12 (twelve) hours as needed for anxiety. 30 tablet 2  . dutasteride (AVODART) 0.5 MG capsule Take 0.5 mg by mouth every evening.     . furosemide (LASIX) 20 MG tablet Take 2 tabs (40 mg) once daily, you may take an extra 2 tabs (40 mg) daily as needed for weight gain of 3 lbs or more in 24 hours 90 tablet 3  . hydrocortisone (ANUSOL-HC) 2.5 % rectal cream bid (Patient taking differently: Place 1 application rectally 2 (two) times daily as needed for hemorrhoids or anal itching. ) 30 g 1  . linaclotide (LINZESS) 290 MCG CAPS capsule Take 1 capsule (290 mcg total) by mouth as needed. (Patient taking differently: Take 290 mcg by  mouth daily as needed (for constipation). ) 30 capsule 11  . losartan (COZAAR) 50 MG tablet Take 0.5 tablets (25 mg total) by mouth daily. (Patient taking differently: Take 25 mg by mouth every evening. ) 90 tablet 1  . metoprolol succinate (TOPROL-XL) 25 MG 24 hr tablet Take 1 tablet (25 mg total) by mouth daily. 90 tablet 3  . Multiple Vitamin (MULTIVITAMIN WITH MINERALS) TABS Take 1 tablet by mouth daily.    . nitroGLYCERIN (NITROSTAT) 0.4 MG SL tablet PLACE ONE TABLET UNDER TONGUE EVERY 5 MINUTES AS NEEDED FOR CHEST PAIN (3 DOSES MAX) 25 tablet 0  . Polyethyl Glycol-Propyl Glycol (SYSTANE OP) Place 1 drop into both eyes daily as needed (for dry eyes).    . vitamin B-12 (CYANOCOBALAMIN) 500 MCG tablet Take 500 mcg by mouth daily.      No current facility-administered medications for this visit.          Allergies  Allergen Reactions  . Ezetimibe Other (See Comments)    Pt was in coma for 6 days   numbness  . Penicillins Other (See Comments)    BLISTERS BETWEEN FINGERS  Has patient had a PCN reaction causing immediate rash, facial/tongue/throat swelling, SOB or lightheadedness with hypotension: unknown Has patient had a PCN reaction causing severe rash involving mucus  membranes or skin necrosis: unknown Has patient had a PCN reaction that required hospitalization no Has patient had a PCN reaction occurring within the last 10 years: no If all of the above answers are "NO", then may proceed with Cephalosporin use.   . Pravastatin Sodium Other (See Comments)    Aches and pains in joints  . Rosuvastatin Other (See Comments)    MYALGIAS  . Niacin Anxiety and Other (See Comments)    "Makes me feel nervous, jittery"    Review of Systems negative except from HPI and PMH  Physical Exam BP 120/60 (BP Location: Left Arm, Patient Position: Sitting, Cuff Size: Normal)   Pulse 73   Ht 5\' 7"  (1.702 m)   Wt 145 lb (65.8 kg)   BMI 22.71 kg/m  Well developed and nourished in no acute distress HENT normal Neck supple with JVP-flat Clear Regular rate and rhythm, no murmurs or gallops Abd-soft with active BS No Clubbing cyanosis edema Skin-warm and dry A & Oriented  Grossly normal sensory and motor function   ECG P synchronous pacing with PVCs trigeminal  Assessment and  Plan  Atrial fibrillation/flutter-persistent  PVCs-right bundle branch superior axis identified by the device with decreased ventricular pacing  Cardiomyopathy recurrent  History of aborted sudden cardiac death    Implantable defibrillator-St. Jude   upgraded single-dual-chamber May 2018  Ischemic cardiomyopathy with prior CABG    Congestive Heart Failure , acute/chronic systolic  Right bundle branch block.  Sinus bradycardia/chronotropic incompetence    Blood pressure is better.  Without lightheadedness.  Will uptitrate his losartan a little bit.  We will begin him on mexiletine for PVC suppression.  We will track this off of his remote.  In the event that we make no headway, we have discussed catheter ablation versus CRT upgrade as alternative strategies.  Given the temporal relationship of his LV dysfunction to the onset of the right ventricular apical  pacing and knowing that he had PVCs in the 15% range in 2015 when his LV function was still normal, makes me lean towards pacemaker cardiomyopathy although there certainly could be a late trajectory fall off and his cardiopathy could be related to  PVCs.  Euvolemic continue current meds  We spent more than 50% of our >25 min visit in face to face counseling regarding the above    Addendum on February 10th of 2020: I reviewed the patient's history and examined the patient.  The patient was noted to be in atrial flutter in the outpatient setting and thus cardioversion was recommended by Dr. Graciela Husbands.  The patient has been appropriately anticoagulated and has not missed any doses.  Terrilee Files, MD

## 2019-01-04 NOTE — Anesthesia Postprocedure Evaluation (Signed)
Anesthesia Post Note  [name]  Procedure(s) Performed: CARDIOVERSION (CATH LAB) (N/A )  Patient location during evaluation: Cath Lab Anesthesia Type: General Level of consciousness: awake and alert Pain management: pain level controlled Vital Signs Assessment: post-procedure vital signs reviewed and stable Respiratory status: spontaneous breathing, nonlabored ventilation, respiratory function stable and patient connected to nasal cannula oxygen Cardiovascular status: blood pressure returned to baseline and stable Postop Assessment: no apparent nausea or vomiting Anesthetic complications: no     Last Vitals:  Vitals:   01/04/19 0815 01/04/19 0830  BP: 122/83 117/73  Pulse: 79 70  Resp: 13 16  Temp:    SpO2: 96% 91%    Last Pain:  Vitals:   01/04/19 0830  TempSrc:   PainSc: 0-No pain                 Vinaya Sancho S

## 2019-01-04 NOTE — Anesthesia Post-op Follow-up Note (Signed)
Anesthesia QCDR form completed.        

## 2019-01-04 NOTE — Anesthesia Preprocedure Evaluation (Signed)
Anesthesia Evaluation  Patient identified by MRN, date of birth, ID band Patient awake    Reviewed: Allergy & Precautions, NPO status , Patient's Chart, lab work & pertinent test results, reviewed documented beta blocker date and time   Airway Mallampati: II  TM Distance: >3 FB     Dental  (+) Chipped   Pulmonary           Cardiovascular hypertension, Pt. on medications and Pt. on home beta blockers + CAD, + Past MI and +CHF  + dysrhythmias Atrial Fibrillation + Cardiac Defibrillator      Neuro/Psych Anxiety    GI/Hepatic GERD  Controlled,  Endo/Other    Renal/GU      Musculoskeletal   Abdominal   Peds  Hematology   Anesthesia Other Findings PVCs. Hx of VF. Rbbb. EF 20-25.  Reproductive/Obstetrics                             Anesthesia Physical Anesthesia Plan  ASA: III  Anesthesia Plan: General   Post-op Pain Management:    Induction: Intravenous  PONV Risk Score and Plan:   Airway Management Planned:   Additional Equipment:   Intra-op Plan:   Post-operative Plan:   Informed Consent: I have reviewed the patients History and Physical, chart, labs and discussed the procedure including the risks, benefits and alternatives for the proposed anesthesia with the patient or authorized representative who has indicated his/her understanding and acceptance.       Plan Discussed with: CRNA  Anesthesia Plan Comments:         Anesthesia Quick Evaluation

## 2019-01-04 NOTE — Transfer of Care (Signed)
Immediate Anesthesia Transfer of Care Note  [name]  Procedure(s) Performed: CARDIOVERSION (CATH LAB) (N/A )  Patient Location: PACU and Radiology  Anesthesia Type:MAC  Level of Consciousness: awake  Airway & Oxygen Therapy: Patient Spontanous Breathing  Post-op Assessment: Post -op Vital signs reviewed and stable  Post vital signs: Reviewed  Last Vitals:  Vitals Value Taken Time  BP 126/78 01/04/2019  7:48 AM  Temp    Pulse 88 01/04/2019  7:54 AM  Resp 18 01/04/2019  7:54 AM  SpO2 95 % 01/04/2019  7:54 AM    Last Pain:  Vitals:   01/04/19 0704  TempSrc: Oral  PainSc: 0-No pain         Complications: No apparent anesthesia complications

## 2019-01-05 ENCOUNTER — Ambulatory Visit (INDEPENDENT_AMBULATORY_CARE_PROVIDER_SITE_OTHER): Payer: Medicare Other

## 2019-01-05 DIAGNOSIS — I469 Cardiac arrest, cause unspecified: Secondary | ICD-10-CM

## 2019-01-05 LAB — CUP PACEART REMOTE DEVICE CHECK
Battery Remaining Longevity: 60 mo
Battery Remaining Percentage: 72 %
Battery Voltage: 2.95 V
Brady Statistic AP VP Percent: 57 %
Brady Statistic AP VS Percent: 14 %
Brady Statistic AS VP Percent: 11 %
Brady Statistic AS VS Percent: 15 %
Brady Statistic RA Percent Paced: 71 %
Brady Statistic RV Percent Paced: 68 %
Date Time Interrogation Session: 20200211113012
HighPow Impedance: 63 Ohm
HighPow Impedance: 63 Ohm
Implantable Lead Implant Date: 20131218
Implantable Lead Implant Date: 20180514
Implantable Lead Location: 753859
Implantable Lead Location: 753860
Implantable Lead Model: 5076
Implantable Pulse Generator Implant Date: 20180514
Lead Channel Impedance Value: 430 Ohm
Lead Channel Impedance Value: 480 Ohm
Lead Channel Pacing Threshold Amplitude: 0.75 V
Lead Channel Pacing Threshold Amplitude: 1.25 V
Lead Channel Pacing Threshold Pulse Width: 0.5 ms
Lead Channel Pacing Threshold Pulse Width: 0.5 ms
Lead Channel Sensing Intrinsic Amplitude: 3.2 mV
Lead Channel Sensing Intrinsic Amplitude: 4.6 mV
Lead Channel Setting Pacing Amplitude: 2 V
Lead Channel Setting Pacing Amplitude: 2.5 V
Lead Channel Setting Pacing Pulse Width: 0.5 ms
Lead Channel Setting Sensing Sensitivity: 0.5 mV
Pulse Gen Serial Number: 1245762

## 2019-01-07 DIAGNOSIS — R972 Elevated prostate specific antigen [PSA]: Secondary | ICD-10-CM | POA: Diagnosis not present

## 2019-01-07 DIAGNOSIS — N401 Enlarged prostate with lower urinary tract symptoms: Secondary | ICD-10-CM | POA: Diagnosis not present

## 2019-01-15 DIAGNOSIS — I493 Ventricular premature depolarization: Secondary | ICD-10-CM | POA: Diagnosis not present

## 2019-01-15 NOTE — Progress Notes (Signed)
Remote ICD transmission.   

## 2019-01-18 NOTE — Progress Notes (Signed)
Noted. thx 

## 2019-01-19 ENCOUNTER — Encounter: Payer: Self-pay | Admitting: *Deleted

## 2019-01-19 ENCOUNTER — Ambulatory Visit (INDEPENDENT_AMBULATORY_CARE_PROVIDER_SITE_OTHER): Payer: Medicare Other | Admitting: Internal Medicine

## 2019-01-19 VITALS — BP 134/64 | HR 73 | Ht 67.0 in | Wt 146.8 lb

## 2019-01-19 DIAGNOSIS — I4819 Other persistent atrial fibrillation: Secondary | ICD-10-CM | POA: Diagnosis not present

## 2019-01-19 DIAGNOSIS — I469 Cardiac arrest, cause unspecified: Secondary | ICD-10-CM

## 2019-01-19 DIAGNOSIS — Z01812 Encounter for preprocedural laboratory examination: Secondary | ICD-10-CM

## 2019-01-19 DIAGNOSIS — Z95810 Presence of automatic (implantable) cardiac defibrillator: Secondary | ICD-10-CM | POA: Diagnosis not present

## 2019-01-19 DIAGNOSIS — I5022 Chronic systolic (congestive) heart failure: Secondary | ICD-10-CM

## 2019-01-19 DIAGNOSIS — I493 Ventricular premature depolarization: Secondary | ICD-10-CM | POA: Diagnosis not present

## 2019-01-19 NOTE — Progress Notes (Signed)
2015     Patient Care Team: Plotnikov, Georgina Quint, MD as PCP - General (Internal Medicine) Wendall Stade, MD as PCP - Cardiology (Cardiology) Duke Salvia, MD as PCP - Electrophysiology (Cardiology) Lindell Noe Lehman Prom., MD (Urology) [name], MD as Consulting Physician (General Surgery) Pyrtle, Carie Caddy, MD as Consulting Physician (Gastroenterology) [name], MD as Referring Physician (Ophthalmology)   HPI  [name] Christensen is a 85 y.o. male Seen follow up for ICD implantation-single chamber undertaken for aborted cardiac arrest in the setting of ischemic heart disease with prior bypass surgery. He also has a history of frequent PVCs  Underwent dual-chamber upgrade 4/18.  Catheterization 12/13 demonstrating patent grafts with an atretic LIMA; LV function was normal S/p CABG    DATE TEST PVC  1/15 Holter 13%  1/18 Holter 31%     DATE TEST EF   12/13 Cath   Patent Grafts  2/15 Echo   45-50 %   12/17 Echo   50-55   4/19 Echo 20-25%   10/19 Echo  25-30%   1/20 Echo  20-25%     Date Cr K Hgb  5/18 0.97  12.2  7/18 1.31  10.5  12.18  1.00 4.7 12.2  4/19 1.1 4.4 11.7  2/20 1.15 4.4 12.5   2/19 device interrogation had demonstrated recurrent episodes of "self terminating atrial fibrillation."  His device was reprogrammed to DDIR to avoid tracking  PVC burden prompted amio for suppression; also for Afib for which he underwent DCCV 5/19.  Intolerant and amio was discontinued.  2019 started on [place]  The issue of his cardiomyopathy has been whether to ascribed to PVCs or pacemaker (see note 10/19) Modest exercise intolerance.  Mostly fatigue without specific shortness of breath.  No edema.        Past Medical History:  Diagnosis Date  . AICD (automatic cardioverter/defibrillator) present 04/07/2017  . Allergy   . Anxiety   . BPH (benign prostatic hypertrophy)    elevated PSA Dr. Lindell Noe Bx 2010  . Cardiac arrest [facility])    a. out-of-hospital  arrest 11/22/2012 - EF 40-45%, patent grafts on cath, received St. Jude AICD.  [name] Carotid disease, bilateral (HCC)    a. 0-39% by doppers.  . Cataract    bil cataracts removed  . CHF (congestive heart failure) (HCC)   . Coronary artery disease    a. s/p MI/CABG 2005. b. s/p cath at time of VF arrest 10/2013 - grafts patent.  . Cough   . Diverticulosis   . Elevated LFTs    a. 10/2012 felt due to cardiac arrest - Hepatitis C Ab reactive from 11/13/2012>>Hep C RNA PCR negative 10/30/2012.   [name] GERD (gastroesophageal reflux disease)   . Hyperlipidemia   . Hypertension   . Internal hemorrhoids   . Myocardial infarction (HCC)    2005 - CABG x 5 2005  . RBBB   . Tubular adenoma of colon   . Ventricular bigeminy    a. Event monitor 01/2013: NSR with PVCs and occ bigeminy.    Past Surgical History:  Procedure Laterality Date  . APPENDECTOMY    . CARDIAC CATHETERIZATION  2007   with patent graft anatomy atretic left internal mammary  artery to the LAD which is nonobstructive. Will restart study June 08, 2007  . CARDIOVERSION N/A 03/27/2018   Procedure: CARDIOVERSION;  Surgeon: Iran Ouch, MD;  Location: [facility] ORS;  Service: Cardiovascular;  Laterality: N/A;  . CARDIOVERSION N/A 01/04/2019  Procedure: CARDIOVERSION (CATH LAB);  Surgeon: Iran Ouch, MD;  Location: [facility] ORS;  Service: Cardiovascular;  Laterality: N/A;  . CATARACT EXTRACTION W/PHACO Right 04/23/2016   Procedure: CATARACT EXTRACTION PHACO AND INTRAOCULAR LENS PLACEMENT (IOC);  Surgeon: [name], MD;  Location: [facility] ORS;  Service: Ophthalmology;  Laterality: Right;  [place] 1.09 AP% 19.3 CDE 13.38 Fluid pack lot # [id number]  . CHOLECYSTECTOMY N/A 06/05/2017   Procedure: LAPAROSCOPIC CHOLECYSTECTOMY WITH INTRAOPERATIVE CHOLANGIOGRAM ERAS PATHWAY POSSIBLE NEEDLE CORE BIOPSY OF LIVER;  Surgeon: [name], MD;  Location: [facility] OR;  Service: General;  Laterality: N/A;  ERAS PATHWAY  . COLONOSCOPY    . CORONARY ARTERY BYPASS  GRAFT  2005  . EYE SURGERY    . ICD GENERATOR CHANGEOUT N/A 04/07/2017   Procedure: ICD Generator Changeout;  Surgeon: Duke Salvia, MD;  Location: [facility] INVASIVE CV LAB;  Service: Cardiovascular;  Laterality: N/A;  . IMPLANTABLE CARDIOVERTER DEFIBRILLATOR IMPLANT N/A 10/27/2012   STJ single chamber ICD implanted by Dr Graciela Husbands for cardiac arrest   . INGUINAL HERNIA REPAIR Bilateral 01/17/2015   Procedure: BILATERAL LAPAROSCOPIC INGUINAL HERNIA REPAIR WITH LEFT FEMORAL HERNIA REPAIR;  Surgeon: [name], MD;  Location: [facility] OR;  Service: General;  Laterality: Bilateral;  . INSERTION OF MESH Bilateral 01/17/2015   Procedure: INSERTION OF MESH;  Surgeon: [name], MD;  Location: [facility] OR;  Service: General;  Laterality: Bilateral;  . LAPAROSCOPIC CHOLECYSTECTOMY  06/05/2017  . LEAD INSERTION N/A 04/07/2017   Procedure: RA Lead Insertion;  Surgeon: Duke Salvia, MD;  Location: [facility] INVASIVE CV LAB;  Service: Cardiovascular;  Laterality: N/A;  . LEAD REVISION/REPAIR N/A 04/08/2017   Procedure: Atrial Lead Revision/Repair;  Surgeon: Duke Salvia, MD;  Location: [facility] INVASIVE CV LAB;  Service: Cardiovascular;  Laterality: N/A;  . LEFT HEART CATHETERIZATION WITH CORONARY/GRAFT ANGIOGRAM N/A 10/29/2012   Procedure: LEFT HEART CATHETERIZATION WITH Isabel Caprice;  Surgeon: [name], MD;  Location: [facility] CATH LAB;  Service: Cardiovascular;  Laterality: N/A;  . LIVER BIOPSY N/A 06/05/2017   Procedure: SINGLE SITE LIVER BIOPSY ERAS PATHWAY;  Surgeon: [name], MD;  Location: [facility] OR;  Service: General;  Laterality: N/A;  ERAS PATHWAY  . LUMBAR FUSION  09/2007    Current Outpatient Medications  Medication Sig Dispense Refill  . acetaminophen (TYLENOL) 325 MG tablet Take 325 mg by mouth every 6 (six) hours as needed for mild pain or moderate pain.     [name] alfuzosin (UROXATRAL) 10 MG 24 hr tablet Take 10 mg by mouth every evening.     [name] apixaban (ELIQUIS) 5 MG TABS tablet Take 1 tablet (5 mg  total) by mouth 2 (two) times daily. 180 tablet 3  . atorvastatin (LIPITOR) 20 MG tablet TAKE 1 TABLET BY MOUTH ONCE A DAY AT 6PM 30 tablet 0  . betamethasone dipropionate (DIPROLENE) 0.05 % cream Apply 1 application topically daily as needed (rash).     . Cyanocobalamin (VITAMIN B-12 PO) Take 1,000 mcg by mouth daily.     . diazepam (VALIUM) 5 MG tablet Take 0.5-1 tablets (2.5-5 mg total) by mouth every 12 (twelve) hours as needed for anxiety. 30 tablet 2  . dutasteride (AVODART) 0.5 MG capsule Take 0.5 mg by mouth every evening.     . furosemide (LASIX) 20 MG tablet Take 2 tabs (40 mg) once daily, you may take an extra 2 tabs (40 mg) daily as needed for weight gain of 3 lbs or more in 24 hours 90 tablet 3  .  linaclotide (LINZESS) 290 MCG CAPS capsule Take 1 capsule (290 mcg total) by mouth as needed. (Patient taking differently: Take 290 mcg by mouth daily as needed (for constipation). ) 30 capsule 11  . losartan (COZAAR) 50 MG tablet Take 1 tablet (50 mg total) by mouth daily. (Patient taking differently: Take 50 mg by mouth every morning. ) 90 tablet 3  . metoprolol succinate (TOPROL-XL) 25 MG 24 hr tablet Take 1 tablet (25 mg total) by mouth daily. (Patient taking differently: Take 25 mg by mouth every evening. ) 90 tablet 3  . mexiletine (MEXITIL) 150 MG capsule Take 1 capsule (150 mg total) by mouth 2 (two) times daily. 60 capsule 6  . nitroGLYCERIN (NITROSTAT) 0.4 MG SL tablet DISSOLVE 1 TABLET UNDER TONGUE AS NEEDEDFOR CHEST PAIN. MAY REPEAT 5 MINUTES APART 3 TIMES IF NEEDED 25 tablet 0  . Polyethyl Glycol-Propyl Glycol (SYSTANE OP) Place 1 drop into both eyes daily as needed (for dry eyes).     No current facility-administered medications for this visit.     Allergies  Allergen Reactions  . Ezetimibe Other (See Comments)    Pt was in coma for 6 days   numbness  . Penicillins Other (See Comments)    BLISTERS BETWEEN FINGERS  Has patient had a PCN reaction causing immediate rash,  facial/tongue/throat swelling, SOB or lightheadedness with hypotension: unknown Has patient had a PCN reaction causing severe rash involving mucus membranes or skin necrosis: unknown Has patient had a PCN reaction that required hospitalization no Has patient had a PCN reaction occurring within the last 10 years: no If all of the above answers are "NO", then may proceed with Cephalosporin use.   . Pravastatin Sodium Other (See Comments)    Aches and pains in joints  . Rosuvastatin Other (See Comments)    MYALGIAS  . Niacin Anxiety and Other (See Comments)    "Makes me feel nervous, jittery"    Review of Systems negative except from HPI and PMH  Physical Exam BP 134/64 (BP Location: Left Arm, Patient Position: Sitting, Cuff Size: Normal)   Pulse 73   Ht 5\' 7"  (1.702 m)   Wt 146 lb 12 oz (66.6 kg)   BMI 22.98 kg/m  Well developed and well nourished in no acute distress HENT normal Neck supple with JVP-flat Clear Chest wall collateralization left greater than right Device pocket well healed; without hematoma or erythema.  There is no tethering  Regular rate and rhythm, no  gallop  murmur Abd-soft with active BS No Clubbing cyanosis  edema Skin-warm and dry A & Oriented  Grossly normal sensory and motor function   ECG V pacing *   Assessment and  Plan  Atrial fibrillation/flutter-persistent  PVCs-right bundle branch superior axis identified by the device with decreased ventricular pacing  Cardiomyopathy recurrent  History of aborted sudden cardiac death    Implantable defibrillator-St. Jude   upgraded single-dual-chamber May 2018  Ischemic cardiomyopathy with prior CABG    Congestive Heart Failure , acute/chronic systolic  Right bundle branch block.  Sinus bradycardia/chronotropic incompetence    Has reverted to atrial fibrillation/flutter.  However, has missed interval anticoagulation and so will not attempt paced termination.  Lengthy discussion as to the  implications of gradually worsening LV function.1 ZIO monitor demonstrated 7% PVCs. Personally reviewed  we reviewed treatment options related to the PVCs which now having failed medications involve catheter ablation.  Alternatively, a strategy directed at pacemaker cardiomyopathy with upgrade was discussed with the possibility of  left upper extremity venous occlusion and the need for tunneling.  He would like to pursue the latter and I think that that is the more appropriate.  The benefits and risks were reviewed including but not limited to death,  perforation, infection, lead dislodgement and device malfunction.  The patient understands agrees and is willing to proceed.  I have been looking through the charts dating back to 2017 where his ventricular pacing percentage was relatively low i.e. less than 20%.  It was to address bradycardia introduced by amiodarone that prompted his upgrade.  It makes me wonder, whether his AV delay can be reprogrammed to allow for intrinsic conduction prior to embarking on a third procedure.  We spent more than 50% of our >40  min visit in face to face counseling regarding the above     We spent more than 50% of our >25 min visit in face to face counseling regarding the above

## 2019-01-19 NOTE — Patient Instructions (Signed)
Medication Instructions:  - Your physician recommends that you continue on your current medications as directed. Please refer to the Current Medication list given to you today.  If you need a refill on your cardiac medications before your next appointment, please call your pharmacy.   Lab work: - Your physician recommends that you have lab work today: BMP/ CBC  If you have labs (blood work) drawn today and your tests are completely normal, you will receive your results only by: [name] MyChart Message (if you have MyChart) OR . A paper copy in the mail If you have any lab test that is abnormal or we need to change your treatment, we will call you to review the results.  Testing/Procedures: - Your physician has recommended that you have a Bi-V (CRT) defibrillator upgrade  See Attached instruction sheet  Follow-Up: At [facility], you and your health needs are our priority.  As part of our continuing mission to provide you with exceptional heart care, we have created designated Provider Care Teams.  These Care Teams include your primary Cardiologist (physician) and Advanced Practice Providers (APPs -  Physician Assistants and Nurse Practitioners) who all work together to provide you with the care you need, when you need it. . in 10-14 days (from 02/01/2019) for a wound check with the [facility]  . In 91 days ( from 02/01/2019) with Dr. [name]  Any Other Special Instructions Will Be Listed Below (If Applicable). - N/A

## 2019-01-20 ENCOUNTER — Ambulatory Visit (INDEPENDENT_AMBULATORY_CARE_PROVIDER_SITE_OTHER): Payer: Medicare Other | Admitting: Internal Medicine

## 2019-01-20 ENCOUNTER — Encounter: Payer: Self-pay | Admitting: Internal Medicine

## 2019-01-20 VITALS — BP 116/58 | HR 76 | Temp 98.0°F | Ht 67.0 in | Wt 147.0 lb

## 2019-01-20 DIAGNOSIS — I4892 Unspecified atrial flutter: Secondary | ICD-10-CM

## 2019-01-20 DIAGNOSIS — G4700 Insomnia, unspecified: Secondary | ICD-10-CM | POA: Diagnosis not present

## 2019-01-20 DIAGNOSIS — E785 Hyperlipidemia, unspecified: Secondary | ICD-10-CM

## 2019-01-20 DIAGNOSIS — I480 Paroxysmal atrial fibrillation: Secondary | ICD-10-CM

## 2019-01-20 NOTE — Patient Instructions (Addendum)
Valerian root for sleep Weighted blanket -  12 or 16 lbs

## 2019-01-20 NOTE — Assessment & Plan Note (Addendum)
Valerian root Weighted blanket 12 lbs

## 2019-01-20 NOTE — Assessment & Plan Note (Signed)
Eliquis In NSR

## 2019-01-20 NOTE — Assessment & Plan Note (Signed)
Eliquis 

## 2019-01-20 NOTE — Assessment & Plan Note (Signed)
Lipitor 

## 2019-01-20 NOTE — Progress Notes (Signed)
Subjective:  Patient ID: [name] Christensen, male    DOB: 17-May-1932  Age: 85 y.o. MRN: [id number]  CC: No chief complaint on file.   HPI CLEMENT DENEAULT presents for dyslipidemia, CHF, PAF f/u C/o insomnia  Outpatient Medications Prior to Visit  Medication Sig Dispense Refill  . acetaminophen (TYLENOL) 325 MG tablet Take 325 mg by mouth every 6 (six) hours as needed for mild pain or moderate pain.     [name] alfuzosin (UROXATRAL) 10 MG 24 hr tablet Take 10 mg by mouth every evening.     [name] apixaban (ELIQUIS) 5 MG TABS tablet Take 1 tablet (5 mg total) by mouth 2 (two) times daily. 180 tablet 3  . atorvastatin (LIPITOR) 20 MG tablet TAKE 1 TABLET BY MOUTH ONCE A DAY AT 6PM 30 tablet 0  . betamethasone dipropionate (DIPROLENE) 0.05 % cream Apply 1 application topically daily as needed (rash).     . Cyanocobalamin (VITAMIN B-12 PO) Take 1,000 mcg by mouth daily.     . diazepam (VALIUM) 5 MG tablet Take 0.5-1 tablets (2.5-5 mg total) by mouth every 12 (twelve) hours as needed for anxiety. 30 tablet 2  . dutasteride (AVODART) 0.5 MG capsule Take 0.5 mg by mouth every evening.     . furosemide (LASIX) 20 MG tablet Take 2 tabs (40 mg) once daily, you may take an extra 2 tabs (40 mg) daily as needed for weight gain of 3 lbs or more in 24 hours 90 tablet 3  . linaclotide (LINZESS) 290 MCG CAPS capsule Take 1 capsule (290 mcg total) by mouth as needed. (Patient taking differently: Take 290 mcg by mouth daily as needed (for constipation). ) 30 capsule 11  . losartan (COZAAR) 50 MG tablet Take 1 tablet (50 mg total) by mouth daily. (Patient taking differently: Take 50 mg by mouth every morning. ) 90 tablet 3  . metoprolol succinate (TOPROL-XL) 25 MG 24 hr tablet Take 1 tablet (25 mg total) by mouth daily. (Patient taking differently: Take 25 mg by mouth every evening. ) 90 tablet 3  . mexiletine (MEXITIL) 150 MG capsule Take 1 capsule (150 mg total) by mouth 2 (two) times daily. 60 capsule 6  . nitroGLYCERIN  (NITROSTAT) 0.4 MG SL tablet DISSOLVE 1 TABLET UNDER TONGUE AS NEEDEDFOR CHEST PAIN. MAY REPEAT 5 MINUTES APART 3 TIMES IF NEEDED 25 tablet 0  . Polyethyl Glycol-Propyl Glycol (SYSTANE OP) Place 1 drop into both eyes daily as needed (for dry eyes).     No facility-administered medications prior to visit.     ROS: Review of Systems  Constitutional: Positive for fatigue. Negative for appetite change and unexpected weight change.  HENT: Negative for congestion, nosebleeds, sneezing, sore throat and trouble swallowing.   Eyes: Negative for itching and visual disturbance.  Respiratory: Negative for cough.   Cardiovascular: Negative for chest pain, palpitations and leg swelling.  Gastrointestinal: Negative for abdominal distention, blood in stool, diarrhea and nausea.  Genitourinary: Negative for frequency and hematuria.  Musculoskeletal: Negative for back pain, gait problem, joint swelling and neck pain.  Skin: Negative for rash.  Neurological: Negative for dizziness, tremors, speech difficulty and weakness.  Psychiatric/Behavioral: Negative for agitation, dysphoric mood, sleep disturbance and suicidal ideas. The patient is not nervous/anxious.     Objective:  BP (!) 116/58 (BP Location: Left Arm, Patient Position: Sitting, Cuff Size: Normal)   Pulse 76   Temp 98 F (36.7 C) (Oral)   Ht 5\' 7"  (1.702 m)   Wt  147 lb (66.7 kg)   SpO2 97%   BMI 23.02 kg/m   BP Readings from Last 3 Encounters:  01/20/19 (!) 116/58  01/19/19 134/64  01/04/19 117/73    Wt Readings from Last 3 Encounters:  01/20/19 147 lb (66.7 kg)  01/19/19 146 lb 12 oz (66.6 kg)  01/04/19 143 lb (64.9 kg)    Physical Exam Constitutional:      General: He is not in acute distress.    Appearance: He is well-developed.     Comments: NAD  Eyes:     Conjunctiva/sclera: Conjunctivae normal.     Pupils: Pupils are equal, round, and reactive to light.  Neck:     Musculoskeletal: Normal range of motion.     Thyroid:  No thyromegaly.     Vascular: No JVD.  Cardiovascular:     Rate and Rhythm: Normal rate and regular rhythm.     Heart sounds: Normal heart sounds. No murmur. No friction rub. No gallop.   Pulmonary:     Effort: Pulmonary effort is normal. No respiratory distress.     Breath sounds: Normal breath sounds. No wheezing or rales.  Chest:     Chest wall: No tenderness.  Abdominal:     General: Bowel sounds are normal. There is no distension.     Palpations: Abdomen is soft. There is no mass.     Tenderness: There is no abdominal tenderness. There is no guarding or rebound.  Musculoskeletal: Normal range of motion.        General: No tenderness.  Lymphadenopathy:     Cervical: No cervical adenopathy.  Skin:    General: Skin is warm and dry.     Findings: No rash.  Neurological:     Mental Status: He is alert and oriented to person, place, and time.     Cranial Nerves: No cranial nerve deficit.     Motor: No abnormal muscle tone.     Coordination: Coordination normal.     Gait: Gait normal.     Deep Tendon Reflexes: Reflexes are normal and symmetric.  Psychiatric:        Behavior: Behavior normal.        Thought Content: Thought content normal.        Judgment: Judgment normal.     Lab Results  Component Value Date   WBC WILL FOLLOW 01/19/2019   HGB WILL FOLLOW 01/19/2019   HCT WILL FOLLOW 01/19/2019   PLT WILL FOLLOW 01/19/2019   GLUCOSE 104 (H) 01/19/2019   CHOL 118 09/11/2016   TRIG 62.0 09/11/2016   HDL 49.60 09/11/2016   LDLCALC 56 09/11/2016   ALT 14 01/29/2018   AST 20 01/29/2018   NA WILL FOLLOW 01/19/2019   K WILL FOLLOW 01/19/2019   CL WILL FOLLOW 01/19/2019   CREATININE 1.08 01/19/2019   BUN 18 01/19/2019   CO2 23 01/19/2019   TSH 1.57 01/29/2018   PSA 12.45 (H) 02/06/2016   INR 1.1 04/02/2017   HGBA1C 6.4 10/10/2017    No results found.  Assessment & Plan:   There are no diagnoses linked to this encounter.   No orders of the defined types were  placed in this encounter.    Follow-up: No follow-ups on file.  Walker Kehr, MD

## 2019-01-21 ENCOUNTER — Telehealth: Payer: Self-pay | Admitting: *Deleted

## 2019-01-21 LAB — CBC WITH DIFFERENTIAL/PLATELET
Basophils Absolute: 0 10*3/uL (ref 0.0–0.2)
Basos: 1 %
EOS (ABSOLUTE): 0.2 10*3/uL (ref 0.0–0.4)
Eos: 3 %
Hematocrit: 36.5 % — ABNORMAL LOW (ref 37.5–51.0)
Hemoglobin: 12.3 g/dL — ABNORMAL LOW (ref 13.0–17.7)
Immature Grans (Abs): 0 10*3/uL (ref 0.0–0.1)
Immature Granulocytes: 0 %
Lymphocytes Absolute: 1.4 10*3/uL (ref 0.7–3.1)
Lymphs: 29 %
MCH: 29.1 pg (ref 26.6–33.0)
MCHC: 33.7 g/dL (ref 31.5–35.7)
MCV: 87 fL (ref 79–97)
Monocytes Absolute: 0.5 10*3/uL (ref 0.1–0.9)
Monocytes: 10 %
Neutrophils Absolute: 2.7 10*3/uL (ref 1.4–7.0)
Neutrophils: 57 %
Platelets: 94 10*3/uL — CL (ref 150–450)
RBC: 4.22 x10E6/uL (ref 4.14–5.80)
RDW: 13.4 % (ref 11.6–15.4)
WBC: 4.7 10*3/uL (ref 3.4–10.8)

## 2019-01-21 LAB — BASIC METABOLIC PANEL
BUN/Creatinine Ratio: 17 (ref 10–24)
BUN: 18 mg/dL (ref 8–27)
CO2: 23 mmol/L (ref 20–29)
Calcium: 10.3 mg/dL — ABNORMAL HIGH (ref 8.6–10.2)
Chloride: 103 mmol/L (ref 96–106)
Creatinine, Ser: 1.08 mg/dL (ref 0.76–1.27)
GFR calc Af Amer: 72 mL/min/{1.73_m2} (ref >59)
GFR calc non Af Amer: 63 mL/min/{1.73_m2} (ref >59)
Glucose: 104 mg/dL — ABNORMAL HIGH (ref 65–99)
Potassium: 4.9 mmol/L (ref 3.5–5.2)
Sodium: 138 mmol/L (ref 134–144)

## 2019-01-21 NOTE — Telephone Encounter (Signed)
Per Dr. [name], plan for appointment in [facility] for possible ICD reprogramming to minimize V pacing. Patient agreeable to appointment on Tuesday, 01/25/19 at 9:00am.  Patient plans to send a transmission tonight as he feels that he has no energy since his OV on 01/19/19. Advised transmission will be reviewed tomorrow and I will call him back then. He verbalizes understanding and agreement with plan.

## 2019-01-22 NOTE — Telephone Encounter (Signed)
Spoke with patient. Patient reports that he feels better this AM, had some "electric shock" sensations in his right chest last night but sensation went away. Advised transmission is stable, no ventricular arrhythmia episodes noted. Persistent AF/A-flutter since 01/19/19. Patient verbalizes understanding and thanked me for my call.

## 2019-01-26 ENCOUNTER — Encounter: Payer: Self-pay | Admitting: Internal Medicine

## 2019-01-26 ENCOUNTER — Ambulatory Visit (INDEPENDENT_AMBULATORY_CARE_PROVIDER_SITE_OTHER): Payer: Medicare Other | Admitting: Internal Medicine

## 2019-01-26 VITALS — BP 122/58 | HR 60

## 2019-01-26 DIAGNOSIS — I4819 Other persistent atrial fibrillation: Secondary | ICD-10-CM

## 2019-01-26 DIAGNOSIS — Z95810 Presence of automatic (implantable) cardiac defibrillator: Secondary | ICD-10-CM

## 2019-01-26 DIAGNOSIS — I469 Cardiac arrest, cause unspecified: Secondary | ICD-10-CM

## 2019-01-26 NOTE — Progress Notes (Signed)
2015     Patient Care Team: Plotnikov, Georgina Quint, MD as PCP - General (Internal Medicine) Wendall Stade, MD as PCP - Cardiology (Cardiology) Duke Salvia, MD as PCP - Electrophysiology (Cardiology) Lindell Noe Lehman Prom., MD (Urology) [name], MD as Consulting Physician (General Surgery) Pyrtle, Carie Caddy, MD as Consulting Physician (Gastroenterology) [name], MD as Referring Physician (Ophthalmology)   HPI  [name] Christensen is a 85 y.o. male Seen follow up for ICD implantation-single chamber undertaken for aborted cardiac arrest in the setting of ischemic heart disease with prior bypass surgery. He also has a history of frequent PVCs  Underwent dual-chamber upgrade 4/18.  Catheterization 12/13 demonstrating patent grafts with an atretic LIMA; LV function was normal S/p CABG    DATE TEST PVC  1/15 Holter 13%  1/18 Holter 31%     DATE TEST EF   12/13 Cath   Patent Grafts  2/15 Echo   45-50 %   12/17 Echo   50-55   4/19 Echo 20-25%   10/19 Echo  25-30%   1/20 Echo  20-25%     Date Cr K Hgb  5/18 0.97  12.2  7/18 1.31  10.5  12.18  1.00 4.7 12.2  4/19 1.1 4.4 11.7  2/20 1.15 4.4 12.5   2/19 device interrogation had demonstrated recurrent episodes of "self terminating atrial fibrillation."  His device was reprogrammed to DDIR to avoid tracking  PVC burden prompted amio for suppression; also for Afib for which he underwent DCCV 5/19.  Intolerant and amio was discontinued.  2019 started on [place]  The issue of his cardiomyopathy has been whether to ascribed to PVCs or pacemaker (see note 10/19) Modest exercise intolerance.  Mostly fatigue without specific shortness of breath.  No edema.        Past Medical History:  Diagnosis Date  . AICD (automatic cardioverter/defibrillator) present 04/07/2017  . Allergy   . Anxiety   . BPH (benign prostatic hypertrophy)    elevated PSA Dr. Lindell Noe Bx 2010  . Cardiac arrest [facility])    a. out-of-hospital  arrest 11/22/2012 - EF 40-45%, patent grafts on cath, received St. Jude AICD.  [name] Carotid disease, bilateral (HCC)    a. 0-39% by doppers.  . Cataract    bil cataracts removed  . CHF (congestive heart failure) (HCC)   . Coronary artery disease    a. s/p MI/CABG 2005. b. s/p cath at time of VF arrest 10/2013 - grafts patent.  . Cough   . Diverticulosis   . Elevated LFTs    a. 10/2012 felt due to cardiac arrest - Hepatitis C Ab reactive from 11/18/2012>>Hep C RNA PCR negative 11/16/2012.   [name] GERD (gastroesophageal reflux disease)   . Hyperlipidemia   . Hypertension   . Internal hemorrhoids   . Myocardial infarction (HCC)    2005 - CABG x 5 2005  . RBBB   . Tubular adenoma of colon   . Ventricular bigeminy    a. Event monitor 01/2013: NSR with PVCs and occ bigeminy.    Past Surgical History:  Procedure Laterality Date  . APPENDECTOMY    . CARDIAC CATHETERIZATION  2007   with patent graft anatomy atretic left internal mammary  artery to the LAD which is nonobstructive. Will restart study June 08, 2007  . CARDIOVERSION N/A 03/27/2018   Procedure: CARDIOVERSION;  Surgeon: Iran Ouch, MD;  Location: [facility] ORS;  Service: Cardiovascular;  Laterality: N/A;  . CARDIOVERSION N/A 01/04/2019  Procedure: CARDIOVERSION (CATH LAB);  Surgeon: Iran Ouch, MD;  Location: [facility] ORS;  Service: Cardiovascular;  Laterality: N/A;  . CATARACT EXTRACTION W/PHACO Right 04/23/2016   Procedure: CATARACT EXTRACTION PHACO AND INTRAOCULAR LENS PLACEMENT (IOC);  Surgeon: [name], MD;  Location: [facility] ORS;  Service: Ophthalmology;  Laterality: Right;  [place] 1.09 AP% 19.3 CDE 13.38 Fluid pack lot # [id number]  . CHOLECYSTECTOMY N/A 06/05/2017   Procedure: LAPAROSCOPIC CHOLECYSTECTOMY WITH INTRAOPERATIVE CHOLANGIOGRAM ERAS PATHWAY POSSIBLE NEEDLE CORE BIOPSY OF LIVER;  Surgeon: [name], MD;  Location: [facility] OR;  Service: General;  Laterality: N/A;  ERAS PATHWAY  . COLONOSCOPY    . CORONARY ARTERY BYPASS  GRAFT  2005  . EYE SURGERY    . ICD GENERATOR CHANGEOUT N/A 04/07/2017   Procedure: ICD Generator Changeout;  Surgeon: Duke Salvia, MD;  Location: [facility] INVASIVE CV LAB;  Service: Cardiovascular;  Laterality: N/A;  . IMPLANTABLE CARDIOVERTER DEFIBRILLATOR IMPLANT N/A 10/25/2012   STJ single chamber ICD implanted by Dr Graciela Husbands for cardiac arrest   . INGUINAL HERNIA REPAIR Bilateral 01/17/2015   Procedure: BILATERAL LAPAROSCOPIC INGUINAL HERNIA REPAIR WITH LEFT FEMORAL HERNIA REPAIR;  Surgeon: [name], MD;  Location: [facility] OR;  Service: General;  Laterality: Bilateral;  . INSERTION OF MESH Bilateral 01/17/2015   Procedure: INSERTION OF MESH;  Surgeon: [name], MD;  Location: [facility] OR;  Service: General;  Laterality: Bilateral;  . LAPAROSCOPIC CHOLECYSTECTOMY  06/05/2017  . LEAD INSERTION N/A 04/07/2017   Procedure: RA Lead Insertion;  Surgeon: Duke Salvia, MD;  Location: [facility] INVASIVE CV LAB;  Service: Cardiovascular;  Laterality: N/A;  . LEAD REVISION/REPAIR N/A 04/08/2017   Procedure: Atrial Lead Revision/Repair;  Surgeon: Duke Salvia, MD;  Location: [facility] INVASIVE CV LAB;  Service: Cardiovascular;  Laterality: N/A;  . LEFT HEART CATHETERIZATION WITH CORONARY/GRAFT ANGIOGRAM N/A 11/19/2012   Procedure: LEFT HEART CATHETERIZATION WITH Isabel Caprice;  Surgeon: [name], MD;  Location: [facility] CATH LAB;  Service: Cardiovascular;  Laterality: N/A;  . LIVER BIOPSY N/A 06/05/2017   Procedure: SINGLE SITE LIVER BIOPSY ERAS PATHWAY;  Surgeon: [name], MD;  Location: [facility] OR;  Service: General;  Laterality: N/A;  ERAS PATHWAY  . LUMBAR FUSION  09/2007    Current Outpatient Medications  Medication Sig Dispense Refill  . acetaminophen (TYLENOL) 325 MG tablet Take 325 mg by mouth every 6 (six) hours as needed for mild pain or moderate pain.     [name] alfuzosin (UROXATRAL) 10 MG 24 hr tablet Take 10 mg by mouth every evening.     [name] apixaban (ELIQUIS) 5 MG TABS tablet Take 1 tablet (5 mg  total) by mouth 2 (two) times daily. 180 tablet 3  . atorvastatin (LIPITOR) 20 MG tablet TAKE 1 TABLET BY MOUTH ONCE A DAY AT 6PM 30 tablet 0  . betamethasone dipropionate (DIPROLENE) 0.05 % cream Apply 1 application topically daily as needed (rash).     . Cyanocobalamin (VITAMIN B-12 PO) Take 1,000 mcg by mouth daily.     . diazepam (VALIUM) 5 MG tablet Take 0.5-1 tablets (2.5-5 mg total) by mouth every 12 (twelve) hours as needed for anxiety. 30 tablet 2  . dutasteride (AVODART) 0.5 MG capsule Take 0.5 mg by mouth every evening.     . furosemide (LASIX) 20 MG tablet Take 2 tabs (40 mg) once daily, you may take an extra 2 tabs (40 mg) daily as needed for weight gain of 3 lbs or more in 24 hours 90 tablet 3  .  linaclotide (LINZESS) 290 MCG CAPS capsule Take 1 capsule (290 mcg total) by mouth as needed. (Patient taking differently: Take 290 mcg by mouth daily as needed (for constipation). ) 30 capsule 11  . losartan (COZAAR) 50 MG tablet Take 1 tablet (50 mg total) by mouth daily. (Patient taking differently: Take 50 mg by mouth every morning. ) 90 tablet 3  . metoprolol succinate (TOPROL-XL) 25 MG 24 hr tablet Take 1 tablet (25 mg total) by mouth daily. (Patient taking differently: Take 25 mg by mouth every evening. ) 90 tablet 3  . mexiletine (MEXITIL) 150 MG capsule Take 1 capsule (150 mg total) by mouth 2 (two) times daily. 60 capsule 6  . nitroGLYCERIN (NITROSTAT) 0.4 MG SL tablet DISSOLVE 1 TABLET UNDER TONGUE AS NEEDEDFOR CHEST PAIN. MAY REPEAT 5 MINUTES APART 3 TIMES IF NEEDED 25 tablet 0  . Polyethyl Glycol-Propyl Glycol (SYSTANE OP) Place 1 drop into both eyes daily as needed (for dry eyes).     No current facility-administered medications for this visit.     Allergies  Allergen Reactions  . Ezetimibe Other (See Comments)    Pt was in coma for 6 days   numbness  . Penicillins Other (See Comments)    BLISTERS BETWEEN FINGERS  Has patient had a PCN reaction causing immediate rash,  facial/tongue/throat swelling, SOB or lightheadedness with hypotension: unknown Has patient had a PCN reaction causing severe rash involving mucus membranes or skin necrosis: unknown Has patient had a PCN reaction that required hospitalization no Has patient had a PCN reaction occurring within the last 10 years: no If all of the above answers are "NO", then may proceed with Cephalosporin use.   . Pravastatin Sodium Other (See Comments)    Aches and pains in joints  . Rosuvastatin Other (See Comments)    MYALGIAS  . Niacin Anxiety and Other (See Comments)    "Makes me feel nervous, jittery"    Review of Systems negative except from HPI and PMH  Physical Exam BP (!) 122/58   Pulse 60   SpO2 98%  Well developed and well nourished in no acute distress HENT normal Neck supple with JVP-flat Clear Chest wall collateralization left greater than right Device pocket well healed; without hematoma or erythema.  There is no tethering  Regular rate and rhythm, no  gallop  murmur Abd-soft with active BS No Clubbing cyanosis  edema Skin-warm and dry A & Oriented  Grossly normal sensory and motor function   ECG V pacing *   Assessment and  Plan  Atrial fibrillation/flutter-persistent  PVCs-right bundle branch superior axis identified by the device with decreased ventricular pacing  Cardiomyopathy recurrent  History of aborted sudden cardiac death    Implantable defibrillator-St. Jude   upgraded single-dual-chamber May 2018  Ischemic cardiomyopathy with prior CABG    Congestive Heart Failure , acute/chronic systolic  Right bundle branch block.  Sinus bradycardia/chronotropic incompetence    Has persisted in A. fib/flutter with intrinsic conduction with his right bundle branch block.  This offers [place] an opportunity to try to exclude pacemaker cardiomyopathy as the cause of his underlying LV dysfunction.  We will plan to cardiovert him with Tikosyn support.  We have reviewed the  potential benefits and risks and the need for hospitalization to exclude proarrhythmia.  We will check his electrolytes today.  Following cardioversion we will attempt to program his pacemaker to allow for intrinsic conduction.  Again this will hopefully allow [place] to distinguish whether his cardiomyopathy is  PVC related or pacemaker related.  In the event that it is the former, would anticipate referral for catheter ablation in the event that Tikosyn is insufficient to suppress.  We will anticipate discontinuing mexiletine with initiation of dofetilide  We spent more than 50% of our >25 min visit in face to face counseling regarding the above

## 2019-01-26 NOTE — Patient Instructions (Signed)
Medication Instructions:  - Your physician recommends that you continue on your current medications as directed. Please refer to the Current Medication list given to you today.  If you need a refill on your cardiac medications before your next appointment, please call your pharmacy.   Lab work: - Your physician recommends that you have lab work today: Magnesium  If you have labs (blood work) drawn today and your tests are completely normal, you will receive your results only by: [name] MyChart Message (if you have MyChart) OR . A paper copy in the mail If you have any lab test that is abnormal or we need to change your treatment, we will call you to review the results.  Testing/Procedures: - We will cancel the defibrillator upgrade that is scheduled for next week.   Follow-Up: At [facility], you and your health needs are our priority.  As part of our continuing mission to provide you with exceptional heart care, we have created designated Provider Care Teams.  These Care Teams include your primary Cardiologist (physician) and Advanced Practice Providers (APPs -  Physician Assistants and Nurse Practitioners) who all work together to provide you with the care you need, when you need it. . We will have the A-fib clinic reach out to you to schedule an appointment to come in for Tikosyn loading at [facility] (see the attached instruction sheet)  Any Other Special Instructions Will Be Listed Below (If Applicable). - N/A

## 2019-01-27 ENCOUNTER — Other Ambulatory Visit: Payer: Self-pay | Admitting: Internal Medicine

## 2019-01-27 ENCOUNTER — Encounter: Payer: Self-pay | Admitting: *Deleted

## 2019-01-27 LAB — CUP PACEART INCLINIC DEVICE CHECK
Battery Remaining Longevity: 66 mo
Brady Statistic RA Percent Paced: 2.6 %
Brady Statistic RV Percent Paced: 71 %
Date Time Interrogation Session: 20200303135515
HighPow Impedance: 69 Ohm
Implantable Lead Implant Date: 20131218
Implantable Lead Implant Date: 20180514
Implantable Lead Location: 753859
Implantable Lead Location: 753860
Implantable Lead Model: 5076
Implantable Pulse Generator Implant Date: 20180514
Lead Channel Impedance Value: 425 Ohm
Lead Channel Impedance Value: 475 Ohm
Lead Channel Setting Pacing Amplitude: 2 V
Lead Channel Setting Pacing Amplitude: 2.5 V
Lead Channel Setting Pacing Pulse Width: 0.5 ms
Lead Channel Setting Sensing Sensitivity: 0.5 mV
Pulse Gen Serial Number: 1245762

## 2019-01-27 LAB — MAGNESIUM: Magnesium: 2 mg/dL (ref 1.6–2.3)

## 2019-02-01 ENCOUNTER — Encounter (HOSPITAL_COMMUNITY): Payer: Self-pay

## 2019-02-01 ENCOUNTER — Telehealth: Payer: Self-pay | Admitting: Internal Medicine

## 2019-02-01 ENCOUNTER — Ambulatory Visit (HOSPITAL_COMMUNITY): Admit: 2019-02-01 | Payer: Medicare Other | Admitting: Internal Medicine

## 2019-02-01 SURGERY — BIV UPGRADE

## 2019-02-01 NOTE — Telephone Encounter (Signed)
Patient calling  Would like to make sure Dr [name] and Nira Conn are aware of his mychart message Patient missed taking medication and would need to reschedule hospital stay Please advise

## 2019-02-01 NOTE — Telephone Encounter (Signed)
Returned the pt call. Lmom. Dr. Olin Pia nurse Jerilee Hoh is off today and will return to the office tomorrow. The pt  02/11/19 procedure with Dr.Klein has been cancelled. I will fwd the message to Nira Conn to f/u with him regarding rescheduling when she returns. The pt is to contact the office if any questions or concerns.

## 2019-02-02 ENCOUNTER — Telehealth: Payer: Self-pay | Admitting: Pharmacist

## 2019-02-02 NOTE — Telephone Encounter (Signed)
Marzetta Board, RN for a-fib clinic is aware that the patient needs to be seen for Tikosyn loading and that he missed a dose of eliquis last week.  Per Marzetta Board, she will be in touch with the patient to schedule for A-fib clinic.

## 2019-02-02 NOTE — Telephone Encounter (Signed)
Medication list reviewed in anticipation of upcoming Tikosyn initiation. Patient is taking alfuzosin which is QTc prolonging but not contraindicated with Tikosyn. Will likely stop mexilitine as well prior to hospitalization, although this is surprisingly not contraindicated with Tikosyn.  Patient is anticoagulated on Eliquis 5mg  BID on the appropriate dose. Pt did miss a dose of Eliquis last week and will require uninterrupted anticoagulation for 3 weeks prior to Tikosyn start.  Patient will need to be counseled to avoid use of Benadryl while on Tikosyn and in the 2-3 days prior to Tikosyn initiation.

## 2019-02-03 LAB — CUP PACEART INCLINIC DEVICE CHECK
Battery Remaining Longevity: 60 mo
Brady Statistic RA Percent Paced: 69 %
Brady Statistic RV Percent Paced: 68 %
Date Time Interrogation Session: 20200225150500
HighPow Impedance: 65.25 Ohm
HighPow Impedance: 68.625
Implantable Lead Implant Date: 20131218
Implantable Lead Implant Date: 20180514
Implantable Lead Location: 753859
Implantable Lead Location: 753860
Implantable Lead Model: 5076
Implantable Pulse Generator Implant Date: 20180514
Lead Channel Impedance Value: 425 Ohm
Lead Channel Impedance Value: 462.5 Ohm
Lead Channel Impedance Value: 475 Ohm
Lead Channel Impedance Value: 487.5 Ohm
Lead Channel Pacing Threshold Amplitude: 1.25 V
Lead Channel Pacing Threshold Pulse Width: 0.5 ms
Lead Channel Sensing Intrinsic Amplitude: 1.2 mV
Lead Channel Sensing Intrinsic Amplitude: 5.5 mV
Lead Channel Setting Pacing Amplitude: 2 V
Lead Channel Setting Pacing Amplitude: 2.5 V
Lead Channel Setting Pacing Pulse Width: 0.5 ms
Lead Channel Setting Sensing Sensitivity: 0.5 mV
Pulse Gen Serial Number: 1245762

## 2019-02-04 NOTE — Telephone Encounter (Signed)
  As far as the mexiletine goes, I spoke with Dr. [name] and he will have him hold this for 48 hours prior to admission.  I have sent the patient a message to please take his last dose on the evening of Friday 03/05/19.

## 2019-02-11 ENCOUNTER — Ambulatory Visit: Payer: Medicare Other

## 2019-02-13 ENCOUNTER — Other Ambulatory Visit: Payer: Self-pay | Admitting: Internal Medicine

## 2019-02-24 ENCOUNTER — Encounter (HOSPITAL_COMMUNITY): Payer: Self-pay

## 2019-03-01 ENCOUNTER — Other Ambulatory Visit: Payer: Self-pay | Admitting: Internal Medicine

## 2019-03-04 ENCOUNTER — Encounter (HOSPITAL_COMMUNITY): Payer: Self-pay

## 2019-03-04 ENCOUNTER — Telehealth (HOSPITAL_COMMUNITY): Payer: Self-pay | Admitting: *Deleted

## 2019-03-04 NOTE — Telephone Encounter (Signed)
Noted-   Raquel Sarna will you let me know if you see a transmission come in on him.   Thanks!

## 2019-03-04 NOTE — Telephone Encounter (Signed)
Rates appear controlled on rate histogram. Continue current therapy.  Will plan for Tikosyn once COVID-19 precautions end.

## 2019-03-04 NOTE — Telephone Encounter (Signed)
Tikosyn admit on hold due to COVID-19 precautions. Pt states he feels well but would like to send a remote transmission to ensure his HRs are controlled since afib treatment is on hold currently. Pt knows we will only call if remote transmission indicates a need for further rate control etc. Pt understands to continue current medication regimen until I contact him to reschedule his tikosyn admit at a later date. He will contact our office or Dr. Olin Pia office should issues arise prior to that time.

## 2019-03-04 NOTE — Telephone Encounter (Signed)
Transmission received and reviewed. V rates appear well controlled. No VT/VF episodes. Vp 14%.

## 2019-03-08 ENCOUNTER — Ambulatory Visit (HOSPITAL_COMMUNITY): Payer: Medicare Other | Admitting: Physician Assistant

## 2019-03-24 ENCOUNTER — Other Ambulatory Visit: Payer: Self-pay | Admitting: Internal Medicine

## 2019-03-24 ENCOUNTER — Ambulatory Visit: Payer: PPO | Admitting: Endocrinology

## 2019-04-06 ENCOUNTER — Ambulatory Visit (INDEPENDENT_AMBULATORY_CARE_PROVIDER_SITE_OTHER): Payer: Medicare Other | Admitting: *Deleted

## 2019-04-06 ENCOUNTER — Other Ambulatory Visit: Payer: Self-pay

## 2019-04-06 DIAGNOSIS — I5022 Chronic systolic (congestive) heart failure: Secondary | ICD-10-CM

## 2019-04-06 DIAGNOSIS — I469 Cardiac arrest, cause unspecified: Secondary | ICD-10-CM

## 2019-04-07 ENCOUNTER — Other Ambulatory Visit: Payer: Self-pay | Admitting: Internal Medicine

## 2019-04-07 LAB — CUP PACEART REMOTE DEVICE CHECK
Date Time Interrogation Session: 20200513085554
Implantable Lead Implant Date: 20131218
Implantable Lead Implant Date: 20180514
Implantable Lead Location: 753859
Implantable Lead Location: 753860
Implantable Lead Model: 5076
Implantable Pulse Generator Implant Date: 20180514
Pulse Gen Serial Number: 1245762

## 2019-04-21 ENCOUNTER — Telehealth: Payer: Self-pay | Admitting: Internal Medicine

## 2019-04-21 DIAGNOSIS — I4819 Other persistent atrial fibrillation: Secondary | ICD-10-CM

## 2019-04-21 NOTE — Telephone Encounter (Signed)
I spoke with the patient regarding his remote transmission and Dr. Olin Pia recommendations.   The patient is agreeable with setting up an echo to be done.  He would like this as early in the day as possible.  He is scheduled for follow up on 05/11/19 with Dr. [name], so I advised him we will get his echo prior to that.   He is aware that we may need to switch his appointment to a virtual visit, but will get back in touch with him about that.   The patient voices understanding and is agreeable.

## 2019-04-21 NOTE — Telephone Encounter (Signed)
Notes recorded by Deboraha Sprang, MD on 04/20/2019 at 8:22 AM EDT Device remote reviewed. Remote is normal. Battery status is good. Lead measurements unchanged. Histograms are appropriate. H with his afib, he is not pacing at all -- this may give [place] a chance to clarify whether his CM is related to pacing Could you plz call him and see how he is doing and can we arrange ane echo for him  Nira Conn Thanks SK

## 2019-04-21 NOTE — Progress Notes (Signed)
Remote ICD transmission.   

## 2019-04-22 ENCOUNTER — Ambulatory Visit: Payer: Medicare Other | Admitting: Internal Medicine

## 2019-04-22 ENCOUNTER — Ambulatory Visit (INDEPENDENT_AMBULATORY_CARE_PROVIDER_SITE_OTHER): Payer: Medicare Other

## 2019-04-22 ENCOUNTER — Other Ambulatory Visit: Payer: Self-pay

## 2019-04-22 DIAGNOSIS — I4819 Other persistent atrial fibrillation: Secondary | ICD-10-CM

## 2019-05-05 ENCOUNTER — Encounter (HOSPITAL_COMMUNITY): Payer: Self-pay

## 2019-05-06 ENCOUNTER — Other Ambulatory Visit: Payer: Self-pay | Admitting: Internal Medicine

## 2019-05-06 ENCOUNTER — Telehealth: Payer: Self-pay

## 2019-05-06 NOTE — Telephone Encounter (Signed)

## 2019-05-11 ENCOUNTER — Encounter: Payer: Self-pay | Admitting: Internal Medicine

## 2019-05-11 ENCOUNTER — Encounter: Payer: Self-pay | Admitting: *Deleted

## 2019-05-11 ENCOUNTER — Ambulatory Visit (INDEPENDENT_AMBULATORY_CARE_PROVIDER_SITE_OTHER): Payer: Medicare Other | Admitting: Internal Medicine

## 2019-05-11 ENCOUNTER — Other Ambulatory Visit
Admission: RE | Admit: 2019-05-11 | Discharge: 2019-05-11 | Disposition: A | Discharge status: Home / Self Care | Payer: Medicare Other | Source: Ambulatory Visit | Attending: Internal Medicine | Admitting: Internal Medicine

## 2019-05-11 ENCOUNTER — Other Ambulatory Visit: Payer: Self-pay

## 2019-05-11 VITALS — BP 140/64 | HR 67 | Ht 67.0 in | Wt 142.1 lb

## 2019-05-11 DIAGNOSIS — I200 Unstable angina: Secondary | ICD-10-CM

## 2019-05-11 DIAGNOSIS — Z95810 Presence of automatic (implantable) cardiac defibrillator: Secondary | ICD-10-CM

## 2019-05-11 DIAGNOSIS — Z01812 Encounter for preprocedural laboratory examination: Secondary | ICD-10-CM | POA: Diagnosis not present

## 2019-05-11 DIAGNOSIS — I255 Ischemic cardiomyopathy: Secondary | ICD-10-CM | POA: Diagnosis not present

## 2019-05-11 DIAGNOSIS — I5022 Chronic systolic (congestive) heart failure: Secondary | ICD-10-CM

## 2019-05-11 DIAGNOSIS — R0789 Other chest pain: Secondary | ICD-10-CM

## 2019-05-11 DIAGNOSIS — I4819 Other persistent atrial fibrillation: Secondary | ICD-10-CM

## 2019-05-11 DIAGNOSIS — Z1159 Encounter for screening for other viral diseases: Secondary | ICD-10-CM | POA: Insufficient documentation

## 2019-05-11 DIAGNOSIS — R079 Chest pain, unspecified: Secondary | ICD-10-CM

## 2019-05-11 MED ORDER — SODIUM CHLORIDE 0.9% FLUSH: 3.0000 mL | Freq: Two times a day (BID) | INTRAVENOUS | Status: DC

## 2019-05-11 NOTE — H&P (View-Only) (Signed)
Patient Care Team: Plotnikov, Georgina Quint, MD as PCP - General (Internal Medicine) Wendall Stade, MD as PCP - Cardiology (Cardiology) Duke Salvia, MD as PCP - Electrophysiology (Cardiology) Lindell Noe Lehman Prom., MD (Urology) [name], MD as Consulting Physician (General Surgery) Pyrtle, Carie Caddy, MD as Consulting Physician (Gastroenterology) [name], MD as Referring Physician (Ophthalmology)   HPI  LUCIAN SCHUENKE is a 85 y.o. male Since last being seen in our clinic for ischemic cardiomyopathy, frequent PVCs, worsening LV function and chronic systolic heart failure persistent atrial fibrillation; the patient reports having lost his appetite in the last month.  This is been concurrent with an ongoing cough.  No change in dyspnea.  No fevers.  He is also noted in the last month progressive exertional and stress related chest discomfort scribed his angina and reminiscent of pre-bypass chest discomfort; relieved by rest and/or nitroglycerin.  No edema.  (2 toothpicks in a pickle) no orthopnea or nocturnal dyspnea.    DATE TEST PVC  1/15 Holter 13%  1/18 Holter 31%     DATE TEST EF   12/13 Cath   Patent Grafts  2/15 Echo   45-50 %   12/17 Echo   50-55   4/19 Echo 20-25%   10/19 Echo  25-30%   1/20 Echo  20-25%      The patient has symptoms of cough and loss of appetite worrisome for COVID 19.    Records and Results Reviewed *  Past Medical History:  Diagnosis Date  . AICD (automatic cardioverter/defibrillator) present 04/07/2017  . Allergy   . Anxiety   . BPH (benign prostatic hypertrophy)    elevated PSA Dr. Lindell Noe Bx 2010  . Cardiac arrest [facility])    a. out-of-hospital arrest 11/13/2012 - EF 40-45%, patent grafts on cath, received St. Jude AICD.  [name] Carotid disease, bilateral (HCC)    a. 0-39% by doppers.  . Cataract    bil cataracts removed  . CHF (congestive heart failure) (HCC)   . Coronary artery disease    a. s/p MI/CABG  2005. b. s/p cath at time of VF arrest 10/2013 - grafts patent.  . Cough   . Diverticulosis   . Elevated LFTs    a. 10/2012 felt due to cardiac arrest - Hepatitis C Ab reactive from 11/13/2012>>Hep C RNA PCR negative 11/15/2012.   [name] GERD (gastroesophageal reflux disease)   . Hyperlipidemia   . Hypertension   . Internal hemorrhoids   . Myocardial infarction (HCC)    2005 - CABG x 5 2005  . RBBB   . Tubular adenoma of colon   . Ventricular bigeminy    a. Event monitor 01/2013: NSR with PVCs and occ bigeminy.    Past Surgical History:  Procedure Laterality Date  . APPENDECTOMY    . CARDIAC CATHETERIZATION  2007   with patent graft anatomy atretic left internal mammary  artery to the LAD which is nonobstructive. Will restart study June 08, 2007  . CARDIOVERSION N/A 03/27/2018   Procedure: CARDIOVERSION;  Surgeon: Iran Ouch, MD;  Location: [facility] ORS;  Service: Cardiovascular;  Laterality: N/A;  . CARDIOVERSION N/A 01/04/2019   Procedure: CARDIOVERSION (CATH LAB);  Surgeon: Iran Ouch, MD;  Location: [facility] ORS;  Service: Cardiovascular;  Laterality: N/A;  . CATARACT EXTRACTION W/PHACO Right 04/23/2016   Procedure: CATARACT EXTRACTION PHACO AND INTRAOCULAR LENS PLACEMENT (IOC);  Surgeon: [name], MD;  Location: [facility] ORS;  Service: Ophthalmology;  Laterality:  Right;  [place] 1.09 AP% 19.3 CDE 13.38 Fluid pack lot # [id number]  . CHOLECYSTECTOMY N/A 06/05/2017   Procedure: LAPAROSCOPIC CHOLECYSTECTOMY WITH INTRAOPERATIVE CHOLANGIOGRAM ERAS PATHWAY POSSIBLE NEEDLE CORE BIOPSY OF LIVER;  Surgeon: [name], MD;  Location: [facility] OR;  Service: General;  Laterality: N/A;  ERAS PATHWAY  . COLONOSCOPY    . CORONARY ARTERY BYPASS GRAFT  2005  . EYE SURGERY    . ICD GENERATOR CHANGEOUT N/A 04/07/2017   Procedure: ICD Generator Changeout;  Surgeon: Duke Salvia, MD;  Location: [facility] INVASIVE CV LAB;  Service: Cardiovascular;  Laterality: N/A;  . IMPLANTABLE CARDIOVERTER DEFIBRILLATOR IMPLANT  N/A 10/28/2012   STJ single chamber ICD implanted by Dr Graciela Husbands for cardiac arrest   . INGUINAL HERNIA REPAIR Bilateral 01/17/2015   Procedure: BILATERAL LAPAROSCOPIC INGUINAL HERNIA REPAIR WITH LEFT FEMORAL HERNIA REPAIR;  Surgeon: [name], MD;  Location: [facility] OR;  Service: General;  Laterality: Bilateral;  . INSERTION OF MESH Bilateral 01/17/2015   Procedure: INSERTION OF MESH;  Surgeon: [name], MD;  Location: [facility] OR;  Service: General;  Laterality: Bilateral;  . LAPAROSCOPIC CHOLECYSTECTOMY  06/05/2017  . LEAD INSERTION N/A 04/07/2017   Procedure: RA Lead Insertion;  Surgeon: Duke Salvia, MD;  Location: [facility] INVASIVE CV LAB;  Service: Cardiovascular;  Laterality: N/A;  . LEAD REVISION/REPAIR N/A 04/08/2017   Procedure: Atrial Lead Revision/Repair;  Surgeon: Duke Salvia, MD;  Location: [facility] INVASIVE CV LAB;  Service: Cardiovascular;  Laterality: N/A;  . LEFT HEART CATHETERIZATION WITH CORONARY/GRAFT ANGIOGRAM N/A 11/22/2012   Procedure: LEFT HEART CATHETERIZATION WITH Isabel Caprice;  Surgeon: [name], MD;  Location: [facility] CATH LAB;  Service: Cardiovascular;  Laterality: N/A;  . LIVER BIOPSY N/A 06/05/2017   Procedure: SINGLE SITE LIVER BIOPSY ERAS PATHWAY;  Surgeon: [name], MD;  Location: [facility] OR;  Service: General;  Laterality: N/A;  ERAS PATHWAY  . LUMBAR FUSION  09/2007    Current Meds  Medication Sig  . acetaminophen (TYLENOL) 325 MG tablet Take 325 mg by mouth every 6 (six) hours as needed for mild pain or moderate pain.   [name] alfuzosin (UROXATRAL) 10 MG 24 hr tablet Take 10 mg by mouth every evening.   [name] apixaban (ELIQUIS) 5 MG TABS tablet Take 1 tablet (5 mg total) by mouth 2 (two) times daily.  [name] atorvastatin (LIPITOR) 20 MG tablet TAKE 1 TABLET BY MOUTH ONCE DAILY AT 6PM  . betamethasone dipropionate (DIPROLENE) 0.05 % cream Apply 1 application topically daily as needed (rash).   . Cyanocobalamin (VITAMIN B-12 PO) Take 1,000 mcg by mouth daily.   . diazepam  (VALIUM) 5 MG tablet Take 0.5-1 tablets (2.5-5 mg total) by mouth every 12 (twelve) hours as needed for anxiety.  . dutasteride (AVODART) 0.5 MG capsule Take 0.5 mg by mouth every evening.   . furosemide (LASIX) 20 MG tablet Take 2 tabs (40 mg) once daily, you may take an extra 2 tabs (40 mg) daily as needed for weight gain of 3 lbs or more in 24 hours  . LINZESS 290 MCG CAPS capsule TAKE 1 CAPSULE BY MOUTH DAILY AS NEEDED  . losartan (COZAAR) 50 MG tablet Take 1 tablet (50 mg total) by mouth daily. (Patient taking differently: Take 50 mg by mouth every morning. )  . metoprolol succinate (TOPROL-XL) 25 MG 24 hr tablet TAKE 1 TABLET BY MOUTH ONCE DAILY  . mexiletine (MEXITIL) 150 MG capsule TAKE 1 CAPSULE BY MOUTH TWICE DAILY  . nitroGLYCERIN (NITROSTAT) 0.4 MG  SL tablet DISSOLVE 1 TABLET UNDER TONGUE AS NEEDEDFOR CHEST PAIN. MAY REPEAT 5 MINUTES APART 3 TIMES IF NEEDED  . Polyethyl Glycol-Propyl Glycol (SYSTANE OP) Place 1 drop into both eyes daily as needed (for dry eyes).  . Promethazine-Codeine 6.25-10 MG/5ML SOLN TAKE BY MOUTH EVERY 4 HOURS AS NEEDED    Allergies  Allergen Reactions  . Ezetimibe Other (See Comments)    Pt was in coma for 6 days   numbness  . Penicillins Other (See Comments)    BLISTERS BETWEEN FINGERS  Has patient had a PCN reaction causing immediate rash, facial/tongue/throat swelling, SOB or lightheadedness with hypotension: unknown Has patient had a PCN reaction causing severe rash involving mucus membranes or skin necrosis: unknown Has patient had a PCN reaction that required hospitalization no Has patient had a PCN reaction occurring within the last 10 years: no If all of the above answers are "NO", then may proceed with Cephalosporin use.   . Pravastatin Sodium Other (See Comments)    Aches and pains in joints  . Rosuvastatin Other (See Comments)    MYALGIAS  . Niacin Anxiety and Other (See Comments)    "Makes me feel nervous, jittery"      Review of  Systems negative except from HPI and PMH  Physical Exam BP 140/64 (BP Location: Left Arm, Patient Position: Sitting, Cuff Size: Normal)   Pulse 67   Ht 5\' 7"  (1.702 m)   Wt 142 lb 1.9 oz (64.5 kg)   BMI 22.26 kg/m  Well developed and well nourished in no acute distress HENT normal E scleral and icterus clear Neck Supple JVP flat; carotids brisk and full Clear occ wheeze and prolonged expiration I:E1;1  Regular rate and rhythm, no murmurs gallops or rub Soft with active bowel sounds No clubbing cyanosis none Edema Alert and oriented, grossly normal motor and sensory function Skin Warm and Dry      ASSESSMENT & PLAN:    Atrial fibrillation/flutter-persistent  PVCs-right bundle branch superior axis identified by the device with decreased ventricular pacing  Cardiomyopathy recurrent  History of aborted sudden cardiac death    Implantable defibrillator-St. Jude   upgraded single-dual-chamber May 2018  Ischemic cardiomyopathy with prior CABG    Congestive Heart Failure chronic systolic  Right bundle branch block.  Reactive airways  Loss of taste  Angina  Sinus bradycardia/chronotropic incompetenced  Multiple issues.  First and foremost is his subacute cough and loss of appetite/taste.  Worrisome for COVID.  We will undertake testing.  If negative, with his cardiomyopathy and worsening chest pain/consistent with angina and a 7-year interval since his last catheterization, have discussed repeat catheterization and this is tentatively scheduled for Friday.  In order to do this, we will need to hold anticoagulation; this will defer dofetilide initiation for another month or so.  His PVC burden is frequent.  His LV function remains impaired not withstanding the discontinuation of RV apical pacing suggesting that the mechanism of his cardiomyopathy is not related to the pacing.  Differential diagnosis would include ischemia as above the PVCs as noted.  Hence,  following catheterization we would initiate dofetilide and follow his PVC burden.  And reassess down the road as to whether he be a candidate for catheter ablation if we are not able to get rid of his PVCs.  He is euvolemic.  Hence, I do not think that hepatic congestion from heart failure is likely the explanation for his anorexia, most it is my impression that it causes  early satiety.    When we get preoperative blood work, will include LFTs g of blood work given anorexia;  TSH was normal one year ago  Follow-up:  TBD    Current medicines are reviewed at length with the patient today.   The patient does not have concerns regarding his medicines.  The following changes were made today:    Labs/ tests ordered today include: CMET, CBC, corona virus   Sherryl Manges, MD  05/11/2019 9:57 AM     [facility] [address] [place] [address] [phone] (office) [phone] (fax)

## 2019-05-11 NOTE — Patient Instructions (Addendum)
Medication Instructions:  - Your physician recommends that you continue on your current medications as directed. Please refer to the Current Medication list given to you today.  If you need a refill on your cardiac medications before your next appointment, please call your pharmacy.   Lab work: - Your physician recommends that you have lab work today: CMET/ CBC  - After you leave the office, please get in your car and drive around to the [place] entrance for a pre-procedure COVID swab to be done. You will need to go home and self quarantine until the morning of your procedure.   If you have labs (blood work) drawn today and your tests are completely normal, you will receive your results only by: [name] MyChart Message (if you have MyChart) OR . A paper copy in the mail If you have any lab test that is abnormal or we need to change your treatment, we will call you to review the results.  Testing/Procedures: - Your physician has requested that you have a cardiac catheterization. Cardiac catheterization is used to diagnose and/or treat various heart conditions. Doctors may recommend this procedure for a number of different reasons. The most common reason is to evaluate chest pain. Chest pain can be a symptom of coronary artery disease (CAD), and cardiac catheterization can show whether plaque is narrowing or blocking your heart's arteries. This procedure is also used to evaluate the valves, as well as measure the blood flow and oxygen levels in different parts of your heart.  - see attached instruction letter  Follow-Up: At [facility], you and your health needs are our priority.  As part of our continuing mission to provide you with exceptional heart care, we have created designated Provider Care Teams.  These Care Teams include your primary Cardiologist (physician) and Advanced Practice Providers (APPs -  Physician Assistants and Nurse Practitioners) who all work together to provide you with  the care you need, when you need it. . pending cardiac cath results  Any Other Special Instructions Will Be Listed Below (If Applicable). - N/A   Coronary Angiogram A coronary angiogram is an X-ray procedure that is used to examine the arteries in the heart. In this procedure, a dye (contrast dye) is injected through a long, thin tube (catheter). The catheter is inserted through the groin, wrist, or arm. The dye is injected into each artery, then X-rays are taken to show if there is a blockage in the arteries of the heart. This procedure can also show if you have valve disease or a disease of the aorta, and it can be used to check the overall function of your heart muscle. You may have a coronary angiogram if:  You are having chest pain, or other symptoms of angina, and you are at risk for heart disease.  You have an abnormal electrocardiogram (ECG) or stress test.  You have chest pain and heart failure.  You are having irregular heart rhythms.  You and your health care provider determine that the benefits of the test information outweigh the risks of the procedure. Let your health care provider know about:  Any allergies you have, including allergies to contrast dye.  All medicines you are taking, including vitamins, herbs, eye drops, creams, and over-the-counter medicines.  Any problems you or family members have had with anesthetic medicines.  Any blood disorders you have.  Any surgeries you have had.  History of kidney problems or kidney failure.  Any medical conditions you have.  Whether  you are pregnant or may be pregnant. What are the risks? Generally, this is a safe procedure. However, problems may occur, including:  Infection.  Allergic reaction to medicines or dyes that are used.  Bleeding from the access site or other locations.  Kidney injury, especially in people with impaired kidney function.  Stroke (rare).  Heart attack (rare).  Damage to other  structures or organs. What happens before the procedure? Staying hydrated Follow instructions from your health care provider about hydration, which may include:  Up to 2 hours before the procedure - you may continue to drink clear liquids, such as water, clear fruit juice, black coffee, and plain tea. Eating and drinking restrictions Follow instructions from your health care provider about eating and drinking, which may include:  8 hours before the procedure - stop eating heavy meals or foods such as meat, fried foods, or fatty foods.  6 hours before the procedure - stop eating light meals or foods, such as toast or cereal.  2 hours before the procedure - stop drinking clear liquids. General instructions  Ask your health care provider about: ? Changing or stopping your regular medicines. This is especially important if you are taking diabetes medicines or blood thinners. ? Taking medicines such as ibuprofen. These medicines can thin your blood. Do not take these medicines before your procedure if your health care provider instructs you not to, though aspirin may be recommended prior to coronary angiograms.  Plan to have someone take you home from the hospital or clinic.  You may need to have blood tests or X-rays done. What happens during the procedure?  An IV tube will be inserted into one of your veins.  You will be given one or more of the following: ? A medicine to help you relax (sedative). ? A medicine to numb the area where the catheter will be inserted into an artery (local anesthetic).  To reduce your risk of infection: ? Your health care team will wash or sanitize their hands. ? Your skin will be washed with soap. ? Hair may be removed from the area where the catheter will be inserted.  You will be connected to a continuous ECG monitor.  The catheter will be inserted into an artery. The location may be in your groin, in your wrist, or in the fold of your arm (near your  elbow).  A type of X-ray (fluoroscopy) will be used to help guide the catheter to the opening of the blood vessel that is being examined.  A dye will be injected into the catheter, and X-rays will be taken. The dye will help to show where any narrowing or blockages are located in the heart arteries.  Tell your health care provider if you have any chest pain or trouble breathing during the procedure.  If blockages are found, your health care provider may perform another procedure, such as inserting a coronary stent. The procedure may vary among health care providers and hospitals. What happens after the procedure?  After the procedure, you will need to keep the area still for a few hours, or for as long as told by your health care provider. If the procedure is done through the groin, you will be instructed to not bend and not cross your legs.  The insertion site will be checked frequently.  The pulse in your foot or wrist will be checked frequently.  You may have additional blood tests, X-rays, and a test that records the electrical activity of your  heart (ECG).  Do not drive for 24 hours if you were given a sedative. Summary  A coronary angiogram is an X-ray procedure that is used to look into the arteries in the heart.  During the procedure, a dye (contrast dye) is injected through a long, thin tube (catheter). The catheter is inserted through the groin, wrist, or arm.  Tell your health care provider about any allergies you have, including allergies to contrast dye.  After the procedure, you will need to keep the area still for a few hours, or for as long as told by your health care provider. This information is not intended to replace advice given to you by your health care provider. Make sure you discuss any questions you have with your health care provider. Document Released: 05/18/2003 Document Revised: 08/23/2016 Document Reviewed: 08/23/2016 Elsevier Interactive Patient  Education  2019 [employer].

## 2019-05-11 NOTE — Progress Notes (Signed)
Patient Care Team: Plotnikov, Georgina Quint, MD as PCP - General (Internal Medicine) Wendall Stade, MD as PCP - Cardiology (Cardiology) Duke Salvia, MD as PCP - Electrophysiology (Cardiology) Lindell Noe Lehman Prom., MD (Urology) [name], MD as Consulting Physician (General Surgery) Pyrtle, Carie Caddy, MD as Consulting Physician (Gastroenterology) [name], MD as Referring Physician (Ophthalmology)   HPI  [name] Christensen is a 85 y.o. male Since last being seen in our clinic for ischemic cardiomyopathy, frequent PVCs, worsening LV function and chronic systolic heart failure persistent atrial fibrillation; the patient reports having lost his appetite in the last month.  This is been concurrent with an ongoing cough.  No change in dyspnea.  No fevers.  He is also noted in the last month progressive exertional and stress related chest discomfort scribed his angina and reminiscent of pre-bypass chest discomfort; relieved by rest and/or nitroglycerin.  No edema.  (2 toothpicks in a pickle) no orthopnea or nocturnal dyspnea.    DATE TEST PVC  1/15 Holter 13%  1/18 Holter 31%     DATE TEST EF   12/13 Cath   Patent Grafts  2/15 Echo   45-50 %   12/17 Echo   50-55   4/19 Echo 20-25%   10/19 Echo  25-30%   1/20 Echo  20-25%      The patient has symptoms of cough and loss of appetite worrisome for COVID 19.    Records and Results Reviewed *  Past Medical History:  Diagnosis Date  . AICD (automatic cardioverter/defibrillator) present 04/07/2017  . Allergy   . Anxiety   . BPH (benign prostatic hypertrophy)    elevated PSA Dr. Lindell Noe Bx 2010  . Cardiac arrest [facility])    a. out-of-hospital arrest 11/13/2012 - EF 40-45%, patent grafts on cath, received St. Jude AICD.  [name] Carotid disease, bilateral (HCC)    a. 0-39% by doppers.  . Cataract    bil cataracts removed  . CHF (congestive heart failure) (HCC)   . Coronary artery disease    a. s/p MI/CABG  2005. b. s/p cath at time of VF arrest 10/2013 - grafts patent.  . Cough   . Diverticulosis   . Elevated LFTs    a. 10/2012 felt due to cardiac arrest - Hepatitis C Ab reactive from 11/13/2012>>Hep C RNA PCR negative 11/15/2012.   [name] GERD (gastroesophageal reflux disease)   . Hyperlipidemia   . Hypertension   . Internal hemorrhoids   . Myocardial infarction (HCC)    2005 - CABG x 5 2005  . RBBB   . Tubular adenoma of colon   . Ventricular bigeminy    a. Event monitor 01/2013: NSR with PVCs and occ bigeminy.    Past Surgical History:  Procedure Laterality Date  . APPENDECTOMY    . CARDIAC CATHETERIZATION  2007   with patent graft anatomy atretic left internal mammary  artery to the LAD which is nonobstructive. Will restart study June 08, 2007  . CARDIOVERSION N/A 03/27/2018   Procedure: CARDIOVERSION;  Surgeon: Iran Ouch, MD;  Location: [facility] ORS;  Service: Cardiovascular;  Laterality: N/A;  . CARDIOVERSION N/A 01/04/2019   Procedure: CARDIOVERSION (CATH LAB);  Surgeon: Iran Ouch, MD;  Location: [facility] ORS;  Service: Cardiovascular;  Laterality: N/A;  . CATARACT EXTRACTION W/PHACO Right 04/23/2016   Procedure: CATARACT EXTRACTION PHACO AND INTRAOCULAR LENS PLACEMENT (IOC);  Surgeon: [name], MD;  Location: [facility] ORS;  Service: Ophthalmology;  Laterality:  Right;  [place] 1.09 AP% 19.3 CDE 13.38 Fluid pack lot # [id number]  . CHOLECYSTECTOMY N/A 06/05/2017   Procedure: LAPAROSCOPIC CHOLECYSTECTOMY WITH INTRAOPERATIVE CHOLANGIOGRAM ERAS PATHWAY POSSIBLE NEEDLE CORE BIOPSY OF LIVER;  Surgeon: [name], MD;  Location: [facility] OR;  Service: General;  Laterality: N/A;  ERAS PATHWAY  . COLONOSCOPY    . CORONARY ARTERY BYPASS GRAFT  2005  . EYE SURGERY    . ICD GENERATOR CHANGEOUT N/A 04/07/2017   Procedure: ICD Generator Changeout;  Surgeon: Duke Salvia, MD;  Location[facility] INVASIVE CV LAB;  Service: Cardiovascular;  Laterality: N/A;  . IMPLANTABLE CARDIOVERTER DEFIBRILLATOR IMPLANT  N/A 10/28/2012   STJ single chamber ICD implanted by Dr Graciela Husbands for cardiac arrest   . INGUINAL HERNIA REPAIR Bilateral 01/17/2015   Procedure: BILATERAL LAPAROSCOPIC INGUINAL HERNIA REPAIR WITH LEFT FEMORAL HERNIA REPAIR;  Surgeon: [name], MD;  Location: [facility] OR;  Service: General;  Laterality: Bilateral;  . INSERTION OF MESH Bilateral 01/17/2015   Procedure: INSERTION OF MESH;  Surgeon: [name], MD;  Location: [facility] OR;  Service: General;  Laterality: Bilateral;  . LAPAROSCOPIC CHOLECYSTECTOMY  06/05/2017  . LEAD INSERTION N/A 04/07/2017   Procedure: RA Lead Insertion;  Surgeon: Duke Salvia, MD;  Location: [facility] INVASIVE CV LAB;  Service: Cardiovascular;  Laterality: N/A;  . LEAD REVISION/REPAIR N/A 04/08/2017   Procedure: Atrial Lead Revision/Repair;  Surgeon: Duke Salvia, MD;  Location: [facility] INVASIVE CV LAB;  Service: Cardiovascular;  Laterality: N/A;  . LEFT HEART CATHETERIZATION WITH CORONARY/GRAFT ANGIOGRAM N/A 11/22/2012   Procedure: LEFT HEART CATHETERIZATION WITH Isabel Caprice;  Surgeon: [name], MD;  Location: [facility] CATH LAB;  Service: Cardiovascular;  Laterality: N/A;  . LIVER BIOPSY N/A 06/05/2017   Procedure: SINGLE SITE LIVER BIOPSY ERAS PATHWAY;  Surgeon: [name], MD;  Location: [facility] OR;  Service: General;  Laterality: N/A;  ERAS PATHWAY  . LUMBAR FUSION  09/2007    Current Meds  Medication Sig  . acetaminophen (TYLENOL) 325 MG tablet Take 325 mg by mouth every 6 (six) hours as needed for mild pain or moderate pain.   [name] alfuzosin (UROXATRAL) 10 MG 24 hr tablet Take 10 mg by mouth every evening.   [name] apixaban (ELIQUIS) 5 MG TABS tablet Take 1 tablet (5 mg total) by mouth 2 (two) times daily.  [name] atorvastatin (LIPITOR) 20 MG tablet TAKE 1 TABLET BY MOUTH ONCE DAILY AT 6PM  . betamethasone dipropionate (DIPROLENE) 0.05 % cream Apply 1 application topically daily as needed (rash).   . Cyanocobalamin (VITAMIN B-12 PO) Take 1,000 mcg by mouth daily.   . diazepam  (VALIUM) 5 MG tablet Take 0.5-1 tablets (2.5-5 mg total) by mouth every 12 (twelve) hours as needed for anxiety.  . dutasteride (AVODART) 0.5 MG capsule Take 0.5 mg by mouth every evening.   . furosemide (LASIX) 20 MG tablet Take 2 tabs (40 mg) once daily, you may take an extra 2 tabs (40 mg) daily as needed for weight gain of 3 lbs or more in 24 hours  . LINZESS 290 MCG CAPS capsule TAKE 1 CAPSULE BY MOUTH DAILY AS NEEDED  . losartan (COZAAR) 50 MG tablet Take 1 tablet (50 mg total) by mouth daily. (Patient taking differently: Take 50 mg by mouth every morning. )  . metoprolol succinate (TOPROL-XL) 25 MG 24 hr tablet TAKE 1 TABLET BY MOUTH ONCE DAILY  . mexiletine (MEXITIL) 150 MG capsule TAKE 1 CAPSULE BY MOUTH TWICE DAILY  . nitroGLYCERIN (NITROSTAT) 0.4 MG  SL tablet DISSOLVE 1 TABLET UNDER TONGUE AS NEEDEDFOR CHEST PAIN. MAY REPEAT 5 MINUTES APART 3 TIMES IF NEEDED  . Polyethyl Glycol-Propyl Glycol (SYSTANE OP) Place 1 drop into both eyes daily as needed (for dry eyes).  . Promethazine-Codeine 6.25-10 MG/5ML SOLN TAKE BY MOUTH EVERY 4 HOURS AS NEEDED    Allergies  Allergen Reactions  . Ezetimibe Other (See Comments)    Pt was in coma for 6 days   numbness  . Penicillins Other (See Comments)    BLISTERS BETWEEN FINGERS  Has patient had a PCN reaction causing immediate rash, facial/tongue/throat swelling, SOB or lightheadedness with hypotension: unknown Has patient had a PCN reaction causing severe rash involving mucus membranes or skin necrosis: unknown Has patient had a PCN reaction that required hospitalization no Has patient had a PCN reaction occurring within the last 10 years: no If all of the above answers are "NO", then may proceed with Cephalosporin use.   . Pravastatin Sodium Other (See Comments)    Aches and pains in joints  . Rosuvastatin Other (See Comments)    MYALGIAS  . Niacin Anxiety and Other (See Comments)    "Makes me feel nervous, jittery"      Review of  Systems negative except from HPI and PMH  Physical Exam BP 140/64 (BP Location: Left Arm, Patient Position: Sitting, Cuff Size: Normal)   Pulse 67   Ht 5\' 7"  (1.702 m)   Wt 142 lb 1.9 oz (64.5 kg)   BMI 22.26 kg/m  Well developed and well nourished in no acute distress HENT normal E scleral and icterus clear Neck Supple JVP flat; carotids brisk and full Clear occ wheeze and prolonged expiration I:E1;1  Regular rate and rhythm, no murmurs gallops or rub Soft with active bowel sounds No clubbing cyanosis none Edema Alert and oriented, grossly normal motor and sensory function Skin Warm and Dry      ASSESSMENT & PLAN:    Atrial fibrillation/flutter-persistent  PVCs-right bundle branch superior axis identified by the device with decreased ventricular pacing  Cardiomyopathy recurrent  History of aborted sudden cardiac death    Implantable defibrillator-St. Jude   upgraded single-dual-chamber May 2018  Ischemic cardiomyopathy with prior CABG    Congestive Heart Failure chronic systolic  Right bundle branch block.  Reactive airways  Loss of taste  Angina  Sinus bradycardia/chronotropic incompetenced  Multiple issues.  First and foremost is his subacute cough and loss of appetite/taste.  Worrisome for COVID.  We will undertake testing.  If negative, with his cardiomyopathy and worsening chest pain/consistent with angina and a 7-year interval since his last catheterization, have discussed repeat catheterization and this is tentatively scheduled for Friday.  In order to do this, we will need to hold anticoagulation; this will defer dofetilide initiation for another month or so.  His PVC burden is frequent.  His LV function remains impaired not withstanding the discontinuation of RV apical pacing suggesting that the mechanism of his cardiomyopathy is not related to the pacing.  Differential diagnosis would include ischemia as above the PVCs as noted.  Hence,  following catheterization we would initiate dofetilide and follow his PVC burden.  And reassess down the road as to whether he be a candidate for catheter ablation if we are not able to get rid of his PVCs.  He is euvolemic.  Hence, I do not think that hepatic congestion from heart failure is likely the explanation for his anorexia, most it is my impression that it causes  early satiety.    When we get preoperative blood work, will include LFTs g of blood work given anorexia;  TSH was normal one year ago  Follow-up:  TBD    Current medicines are reviewed at length with the patient today.   The patient does not have concerns regarding his medicines.  The following changes were made today:    Labs/ tests ordered today include: CMET, CBC, corona virus   Sherryl Manges, MD  05/11/2019 9:57 AM     [facility] [address] [place] [address] [phone] (office) [phone] (fax)

## 2019-05-12 ENCOUNTER — Telehealth: Payer: Self-pay

## 2019-05-12 DIAGNOSIS — E875 Hyperkalemia: Secondary | ICD-10-CM

## 2019-05-12 LAB — CBC WITH DIFFERENTIAL/PLATELET
Basophils Absolute: 0 10*3/uL (ref 0.0–0.2)
Basos: 1 %
EOS (ABSOLUTE): 0.2 10*3/uL (ref 0.0–0.4)
Eos: 4 %
Hematocrit: 37 % — ABNORMAL LOW (ref 37.5–51.0)
Hemoglobin: 12.5 g/dL — ABNORMAL LOW (ref 13.0–17.7)
Immature Grans (Abs): 0 10*3/uL (ref 0.0–0.1)
Immature Granulocytes: 0 %
Lymphocytes Absolute: 1.4 10*3/uL (ref 0.7–3.1)
Lymphs: 31 %
MCH: 30.2 pg (ref 26.6–33.0)
MCHC: 33.8 g/dL (ref 31.5–35.7)
MCV: 89 fL (ref 79–97)
Monocytes Absolute: 0.5 10*3/uL (ref 0.1–0.9)
Monocytes: 11 %
Neutrophils Absolute: 2.5 10*3/uL (ref 1.4–7.0)
Neutrophils: 53 %
Platelets: 101 10*3/uL — ABNORMAL LOW (ref 150–450)
RBC: 4.14 x10E6/uL (ref 4.14–5.80)
RDW: 13 % (ref 11.6–15.4)
WBC: 4.6 10*3/uL (ref 3.4–10.8)

## 2019-05-12 LAB — NOVEL CORONAVIRUS, NAA (HOSP ORDER, SEND-OUT TO REF LAB; TAT 18-24 HRS): SARS-CoV-2, NAA: NOT DETECTED

## 2019-05-12 LAB — COMPREHENSIVE METABOLIC PANEL
ALT: 17 IU/L (ref 0–44)
AST: 26 IU/L (ref 0–40)
Albumin/Globulin Ratio: 1.6 (ref 1.2–2.2)
Albumin: 4.2 g/dL (ref 3.6–4.6)
Alkaline Phosphatase: 120 IU/L — ABNORMAL HIGH (ref 39–117)
BUN/Creatinine Ratio: 13 (ref 10–24)
BUN: 14 mg/dL (ref 8–27)
Bilirubin Total: 0.5 mg/dL (ref 0.0–1.2)
CO2: 25 mmol/L (ref 20–29)
Calcium: 11.1 mg/dL — ABNORMAL HIGH (ref 8.6–10.2)
Chloride: 98 mmol/L (ref 96–106)
Creatinine, Ser: 1.04 mg/dL (ref 0.76–1.27)
GFR calc Af Amer: 76 mL/min/{1.73_m2} (ref >59)
GFR calc non Af Amer: 66 mL/min/{1.73_m2} (ref >59)
Globulin, Total: 2.7 g/dL (ref 1.5–4.5)
Glucose: 94 mg/dL (ref 65–99)
Potassium: 5.3 mmol/L — ABNORMAL HIGH (ref 3.5–5.2)
Sodium: 135 mmol/L (ref 134–144)
Total Protein: 6.9 g/dL (ref 6.0–8.5)

## 2019-05-12 NOTE — Telephone Encounter (Signed)
Pt has agreed to hold his losartan until his repeat BMP is drawn next week. He understands he will be contacted with those results an instructed at that time whether or not to resume. I did not take this medication off his list as it may be a temporary hold.  Pt has agreed [place] [facility] [place] [facility] mall [place]. He is scheduled for a LHC on Friday. If he is placed overnight, he will ask to have a repeat drawn while he is still in the hospital.

## 2019-05-13 ENCOUNTER — Other Ambulatory Visit: Payer: Self-pay | Admitting: Internal Medicine

## 2019-05-14 ENCOUNTER — Encounter
Admission: RE | Disposition: A | Discharge status: Home / Self Care | Payer: Medicare Other | Source: Home / Self Care | Attending: Cardiovascular Disease

## 2019-05-14 ENCOUNTER — Ambulatory Visit
Admission: RE | Admit: 2019-05-14 | Discharge: 2019-05-14 | Disposition: A | Discharge status: Home / Self Care | Payer: Medicare Other | Attending: Cardiovascular Disease | Admitting: Cardiovascular Disease

## 2019-05-14 ENCOUNTER — Other Ambulatory Visit: Payer: Self-pay

## 2019-05-14 ENCOUNTER — Encounter: Payer: Self-pay | Admitting: *Deleted

## 2019-05-14 DIAGNOSIS — N400 Enlarged prostate without lower urinary tract symptoms: Secondary | ICD-10-CM | POA: Insufficient documentation

## 2019-05-14 DIAGNOSIS — I4819 Other persistent atrial fibrillation: Secondary | ICD-10-CM | POA: Diagnosis not present

## 2019-05-14 DIAGNOSIS — Z8674 Personal history of sudden cardiac arrest: Secondary | ICD-10-CM | POA: Insufficient documentation

## 2019-05-14 DIAGNOSIS — K219 Gastro-esophageal reflux disease without esophagitis: Secondary | ICD-10-CM | POA: Diagnosis not present

## 2019-05-14 DIAGNOSIS — Z880 Allergy status to penicillin: Secondary | ICD-10-CM | POA: Diagnosis not present

## 2019-05-14 DIAGNOSIS — Z95810 Presence of automatic (implantable) cardiac defibrillator: Secondary | ICD-10-CM | POA: Diagnosis not present

## 2019-05-14 DIAGNOSIS — Z79899 Other long term (current) drug therapy: Secondary | ICD-10-CM | POA: Diagnosis not present

## 2019-05-14 DIAGNOSIS — I4892 Unspecified atrial flutter: Secondary | ICD-10-CM | POA: Diagnosis not present

## 2019-05-14 DIAGNOSIS — I200 Unstable angina: Secondary | ICD-10-CM

## 2019-05-14 DIAGNOSIS — I2584 Coronary atherosclerosis due to calcified coronary lesion: Secondary | ICD-10-CM | POA: Insufficient documentation

## 2019-05-14 DIAGNOSIS — E785 Hyperlipidemia, unspecified: Secondary | ICD-10-CM | POA: Diagnosis not present

## 2019-05-14 DIAGNOSIS — I4510 Unspecified right bundle-branch block: Secondary | ICD-10-CM | POA: Diagnosis not present

## 2019-05-14 DIAGNOSIS — I5022 Chronic systolic (congestive) heart failure: Secondary | ICD-10-CM | POA: Diagnosis not present

## 2019-05-14 DIAGNOSIS — Z951 Presence of aortocoronary bypass graft: Secondary | ICD-10-CM | POA: Insufficient documentation

## 2019-05-14 DIAGNOSIS — I252 Old myocardial infarction: Secondary | ICD-10-CM | POA: Insufficient documentation

## 2019-05-14 DIAGNOSIS — Z888 Allergy status to other drugs, medicaments and biological substances status: Secondary | ICD-10-CM | POA: Diagnosis not present

## 2019-05-14 DIAGNOSIS — I25810 Atherosclerosis of coronary artery bypass graft(s) without angina pectoris: Secondary | ICD-10-CM | POA: Diagnosis not present

## 2019-05-14 DIAGNOSIS — I2510 Atherosclerotic heart disease of native coronary artery without angina pectoris: Secondary | ICD-10-CM | POA: Diagnosis not present

## 2019-05-14 DIAGNOSIS — Z7901 Long term (current) use of anticoagulants: Secondary | ICD-10-CM | POA: Diagnosis not present

## 2019-05-14 DIAGNOSIS — I255 Ischemic cardiomyopathy: Secondary | ICD-10-CM

## 2019-05-14 DIAGNOSIS — R439 Unspecified disturbances of smell and taste: Secondary | ICD-10-CM | POA: Diagnosis not present

## 2019-05-14 DIAGNOSIS — I493 Ventricular premature depolarization: Secondary | ICD-10-CM | POA: Diagnosis not present

## 2019-05-14 DIAGNOSIS — I110 Hypertensive heart disease with heart failure: Secondary | ICD-10-CM | POA: Insufficient documentation

## 2019-05-14 DIAGNOSIS — R079 Chest pain, unspecified: Secondary | ICD-10-CM | POA: Diagnosis present

## 2019-05-14 HISTORY — PX: LEFT HEART CATH AND CORONARY ANGIOGRAPHY: CATH118249

## 2019-05-14 LAB — CARDIAC CATHETERIZATION: Cath EF Quantitative: 25 %

## 2019-05-14 SURGERY — LEFT HEART CATH AND CORONARY ANGIOGRAPHY
Anesthesia: Moderate Sedation | Laterality: Left

## 2019-05-14 MED ORDER — FENTANYL CITRATE (PF) 100 MCG/2ML IJ SOLN
INTRAMUSCULAR | Status: AC
  Filled 2019-05-14: qty 2

## 2019-05-14 MED ORDER — ACETAMINOPHEN 325 MG PO TABS: 650.0000 mg | ORAL_TABLET | ORAL | Status: DC | PRN

## 2019-05-14 MED ORDER — VERAPAMIL HCL 2.5 MG/ML IV SOLN
INTRAVENOUS | Status: AC
  Filled 2019-05-14: qty 2

## 2019-05-14 MED ORDER — HEPARIN SODIUM (PORCINE) 1000 UNIT/ML IJ SOLN
INTRAMUSCULAR | Status: DC | PRN
  Administered 2019-05-14: 3500 [IU] via INTRAVENOUS

## 2019-05-14 MED ORDER — MIDAZOLAM HCL 2 MG/2ML IJ SOLN
INTRAMUSCULAR | Status: AC
  Filled 2019-05-14: qty 2

## 2019-05-14 MED ORDER — SODIUM CHLORIDE 0.9 % WEIGHT BASED INFUSION
3.0000 mL/kg/h | INTRAVENOUS | Status: AC
  Administered 2019-05-14: 3 mL/kg/h via INTRAVENOUS

## 2019-05-14 MED ORDER — FENTANYL CITRATE (PF) 100 MCG/2ML IJ SOLN
INTRAMUSCULAR | Status: DC | PRN
  Administered 2019-05-14: 25 ug via INTRAVENOUS

## 2019-05-14 MED ORDER — SODIUM CHLORIDE 0.9% FLUSH: 3.0000 mL | INTRAVENOUS | Status: DC | PRN

## 2019-05-14 MED ORDER — HEPARIN (PORCINE) IN NACL 1000-0.9 UT/500ML-% IV SOLN
INTRAVENOUS | Status: AC
  Filled 2019-05-14: qty 1000

## 2019-05-14 MED ORDER — IOHEXOL 300 MG/ML  SOLN
INTRAMUSCULAR | Status: DC | PRN
  Administered 2019-05-14: 85 mL via INTRA_ARTERIAL

## 2019-05-14 MED ORDER — MIDAZOLAM HCL 2 MG/2ML IJ SOLN
INTRAMUSCULAR | Status: DC | PRN
  Administered 2019-05-14: 1 mg via INTRAVENOUS

## 2019-05-14 MED ORDER — SODIUM CHLORIDE 0.9% FLUSH: 3.0000 mL | Freq: Two times a day (BID) | INTRAVENOUS | Status: DC

## 2019-05-14 MED ORDER — HEPARIN SODIUM (PORCINE) 1000 UNIT/ML IJ SOLN
INTRAMUSCULAR | Status: AC
  Filled 2019-05-14: qty 1

## 2019-05-14 MED ORDER — SODIUM CHLORIDE 0.9 % IV SOLN: INTRAVENOUS | Status: DC

## 2019-05-14 MED ORDER — ONDANSETRON HCL 4 MG/2ML IJ SOLN: 4.0000 mg | Freq: Four times a day (QID) | INTRAMUSCULAR | Status: DC | PRN

## 2019-05-14 MED ORDER — SODIUM CHLORIDE 0.9 % IV SOLN: 250.0000 mL | INTRAVENOUS | Status: DC | PRN

## 2019-05-14 MED ORDER — ASPIRIN 81 MG PO CHEW: 81.0000 mg | CHEWABLE_TABLET | ORAL | Status: DC

## 2019-05-14 MED ORDER — HEPARIN (PORCINE) IN NACL 1000-0.9 UT/500ML-% IV SOLN
INTRAVENOUS | Status: DC | PRN
  Administered 2019-05-14: 500 mL

## 2019-05-14 MED ORDER — VERAPAMIL HCL 2.5 MG/ML IV SOLN
INTRAVENOUS | Status: DC | PRN
  Administered 2019-05-14: 2.5 mg via INTRA_ARTERIAL

## 2019-05-14 MED ORDER — SODIUM CHLORIDE 0.9 % WEIGHT BASED INFUSION: 1.0000 mL/kg/h | INTRAVENOUS | Status: DC

## 2019-05-14 SURGICAL SUPPLY — 6 items
CATH 5F 110X4 TIG (CATHETERS) ×2 IMPLANT
DEVICE RAD TR BAND REGULAR (VASCULAR PRODUCTS) ×2 IMPLANT
GLIDESHEATH SLEND SS 6F .021 (SHEATH) ×2 IMPLANT
KIT MANI 3VAL PERCEP (MISCELLANEOUS) ×2 IMPLANT
PACK CARDIAC CATH (CUSTOM PROCEDURE TRAY) ×2 IMPLANT
WIRE ROSEN-J .035X260CM (WIRE) ×2 IMPLANT

## 2019-05-14 NOTE — Discharge Instructions (Signed)
Radial Site Care ° °This sheet gives you information about how to care for yourself after your procedure. Your health care provider may also give you more specific instructions. If you have problems or questions, contact your health care provider. °What can I expect after the procedure? °After the procedure, it is common to have: °· Bruising and tenderness at the catheter insertion area. °Follow these instructions at home: °Medicines °· Take over-the-counter and prescription medicines only as told by your health care provider. °Insertion site care °· Follow instructions from your health care provider about how to take care of your insertion site. Make sure you: °? Wash your hands with soap and water before you change your bandage (dressing). If soap and water are not available, use hand sanitizer. °? Change your dressing as told by your health care provider. °? Leave stitches (sutures), skin glue, or adhesive strips in place. These skin closures may need to stay in place for 2 weeks or longer. If adhesive strip edges start to loosen and curl up, you may trim the loose edges. Do not remove adhesive strips completely unless your health care provider tells you to do that. °· Check your insertion site every day for signs of infection. Check for: °? Redness, swelling, or pain. °? Fluid or blood. °? Pus or a bad smell. °? Warmth. °· Do not take baths, swim, or use a hot tub until your health care provider approves. °· You may shower 24-48 hours after the procedure, or as directed by your health care provider. °? Remove the dressing and gently wash the site with plain soap and water. °? Pat the area dry with a clean towel. °? Do not rub the site. That could cause bleeding. °· Do not apply powder or lotion to the site. °Activity ° °· For 24 hours after the procedure, or as directed by your health care provider: °? Do not flex or bend the affected arm. °? Do not push or pull heavy objects with the affected arm. °? Do not  drive yourself home from the hospital or clinic. You may drive 24 hours after the procedure unless your health care provider tells you not to. °? Do not operate machinery or power tools. °· Do not lift anything that is heavier than 10 lb (4.5 kg), or the limit that you are told, until your health care provider says that it is safe. °· Ask your health care provider when it is okay to: °? Return to work or school. °? Resume usual physical activities or sports. °? Resume sexual activity. °General instructions °· If the catheter site starts to bleed, raise your arm and put firm pressure on the site. If the bleeding does not stop, get help right away. This is a medical emergency. °· If you went home on the same day as your procedure, a responsible adult should be with you for the first 24 hours after you arrive home. °· Keep all follow-up visits as told by your health care provider. This is important. °Contact a health care provider if: °· You have a fever. °· You have redness, swelling, or yellow drainage around your insertion site. °Get help right away if: °· You have unusual pain at the radial site. °· The catheter insertion area swells very fast. °· The insertion area is bleeding, and the bleeding does not stop when you hold steady pressure on the area. °· Your arm or hand becomes pale, cool, tingly, or numb. °These symptoms may represent a serious problem   that is an emergency. Do not wait to see if the symptoms will go away. Get medical help right away. Call your local emergency services (911 in the [place] Do not drive yourself to the hospital. Summary  After the procedure, it is common to have bruising and tenderness at the site.  Follow instructions from your health care provider about how to take care of your radial site wound. Check the wound every day for signs of infection.  Do not lift anything that is heavier than 10 lb (4.5 kg), or the limit that you are told, until your health care provider says  that it is safe. This information is not intended to replace advice given to you by your health care provider. Make sure you discuss any questions you have with your health care provider. Document Released: 12/14/2010 Document Revised: 12/17/2017 Document Reviewed: 12/17/2017 Elsevier Interactive Patient Education  2019 [place] After This sheet gives you information about how to care for yourself after your procedure. Your health care provider may also give you more specific instructions. If you have problems or questions, contact your health care provider. What can I expect after the procedure? After the procedure, it is common to have bruising and tenderness at the catheter insertion area. Follow these instructions at home: Insertion site care  Follow instructions from your health care provider about how to take care of your insertion site. Make sure you: ? Wash your hands with soap and water before you change your bandage (dressing). If soap and water are not available, use hand sanitizer. ? Change your dressing as told by your health care provider. ? Leave stitches (sutures), skin glue, or adhesive strips in place. These skin closures may need to stay in place for 2 weeks or longer. If adhesive strip edges start to loosen and curl up, you may trim the loose edges. Do not remove adhesive strips completely unless your health care provider tells you to do that.  Do not take baths, swim, or use a hot tub until your health care provider approves.  You may shower 24-48 hours after the procedure or as told by your health care provider. ? Gently wash the site with plain soap and water. ? Pat the area dry with a clean towel. ? Do not rub the site. This may cause bleeding.  Do not apply powder or lotion to the site. Keep the site clean and dry.  Check your insertion site every day for signs of infection. Check for: ? Redness, swelling, or pain. ? Fluid or  blood. ? Warmth. ? Pus or a bad smell. Activity  Rest as told by your health care provider, usually for 1-2 days.  Do not lift anything that is heavier than 10 lbs. (4.5 kg) or as told by your health care provider.  Do not drive for 24 hours if you were given a medicine to help you relax (sedative).  Do not drive or use heavy machinery while taking prescription pain medicine. General instructions   Return to your normal activities as told by your health care provider, usually in about a week. Ask your health care provider what activities are safe for you.  If the catheter site starts bleeding, lie flat and put pressure on the site. If the bleeding does not stop, get help right away. This is a medical emergency.  Drink enough fluid to keep your urine clear or pale yellow. This helps flush the contrast dye from your body.  Take over-the-counter  and prescription medicines only as told by your health care provider.  Keep all follow-up visits as told by your health care provider. This is important. Contact a health care provider if:  You have a fever or chills.  You have redness, swelling, or pain around your insertion site.  You have fluid or blood coming from your insertion site.  The insertion site feels warm to the touch.  You have pus or a bad smell coming from your insertion site.  You have bruising around the insertion site.  You notice blood collecting in the tissue around the catheter site (hematoma). The hematoma may be painful to the touch. Get help right away if:  You have severe pain at the catheter insertion area.  The catheter insertion area swells very fast.  The catheter insertion area is bleeding, and the bleeding does not stop when you hold steady pressure on the area.  The area near or just beyond the catheter insertion site becomes pale, cool, tingly, or numb. These symptoms may represent a serious problem that is an emergency. Do not wait to see if the  symptoms will go away. Get medical help right away. Call your local emergency services (911 in the [place] Do not drive yourself to the hospital. Summary  After the procedure, it is common to have bruising and tenderness at the catheter insertion area.  After the procedure, it is important to rest and drink plenty of fluids.  Do not take baths, swim, or use a hot tub until your health care provider says it is okay to do so. You may shower 24-48 hours after the procedure or as told by your health care provider.  If the catheter site starts bleeding, lie flat and put pressure on the site. If the bleeding does not stop, get help right away. This is a medical emergency. This information is not intended to replace advice given to you by your health care provider. Make sure you discuss any questions you have with your health care provider. Document Released: 05/30/2005 Document Revised: 10/16/2016 Document Reviewed: 10/16/2016 Elsevier Interactive Patient Education  2019 [place] tomorrow morning as long as no bleeding from the left radial site.

## 2019-05-14 NOTE — Interval H&P Note (Signed)
Cath Lab Visit (complete for each Cath Lab visit)  Clinical Evaluation Leading to the Procedure:   ACS: No.  Non-ACS:    Anginal Classification: CCS III  Anti-ischemic medical therapy: Minimal Therapy (1 class of medications)  Non-Invasive Test Results: No non-invasive testing performed  Prior CABG: Previous CABG      History and Physical Interval Note:  05/14/2019 1:27 PM  [name]  has presented today for surgery, with the diagnosis of LT Cath   Chest pain.  The various methods of treatment have been discussed with the patient and family. After consideration of risks, benefits and other options for treatment, the patient has consented to  Procedure(s): LEFT HEART CATH AND CORONARY ANGIOGRAPHY (Left) as a surgical intervention.  The patient's history has been reviewed, patient examined, no change in status, stable for surgery.  I have reviewed the patient's chart and labs.  Questions were answered to the patient's satisfaction.     [name] Christensen

## 2019-05-17 ENCOUNTER — Encounter: Payer: Self-pay | Admitting: Cardiovascular Disease

## 2019-05-24 ENCOUNTER — Other Ambulatory Visit: Payer: Self-pay

## 2019-05-24 ENCOUNTER — Ambulatory Visit (INDEPENDENT_AMBULATORY_CARE_PROVIDER_SITE_OTHER): Payer: Medicare Other | Admitting: Internal Medicine

## 2019-05-24 ENCOUNTER — Other Ambulatory Visit (INDEPENDENT_AMBULATORY_CARE_PROVIDER_SITE_OTHER): Payer: Medicare Other

## 2019-05-24 ENCOUNTER — Encounter: Payer: Self-pay | Admitting: Internal Medicine

## 2019-05-24 VITALS — BP 124/76 | HR 63 | Temp 97.6°F | Ht 67.0 in | Wt 140.0 lb

## 2019-05-24 DIAGNOSIS — R5382 Chronic fatigue, unspecified: Secondary | ICD-10-CM

## 2019-05-24 DIAGNOSIS — I10 Essential (primary) hypertension: Secondary | ICD-10-CM | POA: Diagnosis not present

## 2019-05-24 DIAGNOSIS — E559 Vitamin D deficiency, unspecified: Secondary | ICD-10-CM

## 2019-05-24 DIAGNOSIS — R202 Paresthesia of skin: Secondary | ICD-10-CM | POA: Diagnosis not present

## 2019-05-24 DIAGNOSIS — I5032 Chronic diastolic (congestive) heart failure: Secondary | ICD-10-CM | POA: Diagnosis not present

## 2019-05-24 DIAGNOSIS — R509 Fever, unspecified: Secondary | ICD-10-CM

## 2019-05-24 DIAGNOSIS — I480 Paroxysmal atrial fibrillation: Secondary | ICD-10-CM | POA: Diagnosis not present

## 2019-05-24 DIAGNOSIS — I200 Unstable angina: Secondary | ICD-10-CM | POA: Diagnosis not present

## 2019-05-24 LAB — CBC WITH DIFFERENTIAL/PLATELET
Basophils Absolute: 0 10*3/uL (ref 0.0–0.1)
Basophils Relative: 0.5 % (ref 0.0–3.0)
Eosinophils Absolute: 0.2 10*3/uL (ref 0.0–0.7)
Eosinophils Relative: 3.1 % (ref 0.0–5.0)
HCT: 38 % — ABNORMAL LOW (ref 39.0–52.0)
Hemoglobin: 12.5 g/dL — ABNORMAL LOW (ref 13.0–17.0)
Lymphocytes Relative: 33.1 % (ref 12.0–46.0)
Lymphs Abs: 1.6 10*3/uL (ref 0.7–4.0)
MCHC: 32.8 g/dL (ref 30.0–36.0)
MCV: 92 fl (ref 78.0–100.0)
Monocytes Absolute: 0.5 10*3/uL (ref 0.1–1.0)
Monocytes Relative: 10.2 % (ref 3.0–12.0)
Neutro Abs: 2.6 10*3/uL (ref 1.4–7.7)
Neutrophils Relative %: 53.1 % (ref 43.0–77.0)
Platelets: 87 10*3/uL — ABNORMAL LOW (ref 150.0–400.0)
RBC: 4.13 Mil/uL — ABNORMAL LOW (ref 4.22–5.81)
RDW: 14.3 % (ref 11.5–15.5)
WBC: 4.9 10*3/uL (ref 4.0–10.5)

## 2019-05-24 LAB — TSH: TSH: 2.65 u[IU]/mL (ref 0.35–4.50)

## 2019-05-24 LAB — VITAMIN D 25 HYDROXY (VIT D DEFICIENCY, FRACTURES): VITD: 63.48 ng/mL (ref 30.00–100.00)

## 2019-05-24 LAB — VITAMIN B12: Vitamin B-12: 1060 pg/mL — ABNORMAL HIGH (ref 211–911)

## 2019-05-24 MED ORDER — CEFUROXIME AXETIL 250 MG PO TABS: 250.0000 mg | ORAL_TABLET | Freq: Two times a day (BID) | ORAL | 0 refills | Status: AC

## 2019-05-24 NOTE — Assessment & Plan Note (Signed)
PO Abx

## 2019-05-24 NOTE — Assessment & Plan Note (Signed)
On Eliquis. F/u w/Dr [name]

## 2019-05-24 NOTE — Assessment & Plan Note (Signed)
Losartan was stopped on 05/12/19 due to high K

## 2019-05-24 NOTE — Progress Notes (Signed)
Subjective:  Patient ID: [name] Christensen, male    DOB: 1932-06-09  Age: 85 y.o. MRN: [id number]  CC: No chief complaint on file.   HPI [name] Christensen presents for CAD - had a cath C/o productive cough. C/o fatigue - worse after a heart cath test 2 wks ago... Losartan was stopped on 05/12/19 due to high K   Outpatient Medications Prior to Visit  Medication Sig Dispense Refill  . acetaminophen (TYLENOL) 500 MG tablet Take 500 mg by mouth at bedtime.    [name] alfuzosin (UROXATRAL) 10 MG 24 hr tablet Take 10 mg by mouth every evening.     [name] apixaban (ELIQUIS) 5 MG TABS tablet Take 1 tablet (5 mg total) by mouth 2 (two) times daily. 180 tablet 3  . atorvastatin (LIPITOR) 20 MG tablet TAKE 1 TABLET BY MOUTH ONCE DAILY AT 6PM (Patient taking differently: Take 20 mg by mouth daily. TAKE 1 TABLET BY MOUTH ONCE DAILY AT 6P) 90 tablet 1  . Cholecalciferol (VITAMIN D) 50 MCG (2000 UT) tablet Take 2,000 Units by mouth every evening.    . dutasteride (AVODART) 0.5 MG capsule Take 0.5 mg by mouth every evening.     [name] LINZESS 290 MCG CAPS capsule TAKE 1 CAPSULE BY MOUTH DAILY AS NEEDED (Patient taking differently: Take 290 mcg by mouth daily as needed (constipation). ) 30 capsule 11  . losartan (COZAAR) 50 MG tablet Take 1 tablet (50 mg total) by mouth daily. (Patient taking differently: Take 50 mg by mouth every evening. ) 90 tablet 3  . metoprolol succinate (TOPROL-XL) 25 MG 24 hr tablet TAKE 1 TABLET BY MOUTH ONCE DAILY (Patient taking differently: Take 25 mg by mouth daily. ) 90 tablet 2  . mexiletine (MEXITIL) 150 MG capsule TAKE 1 CAPSULE BY MOUTH TWICE DAILY (Patient taking differently: Take 150 mg by mouth 2 (two) times daily. ) 60 capsule 6  . Multiple Vitamin (MULTIVITAMIN WITH MINERALS) TABS tablet Take 1 tablet by mouth every other day.    . nitroGLYCERIN (NITROSTAT) 0.4 MG SL tablet DISSOLVE 1 TABLET UNDER TONGUE AS NEEDEDFOR CHEST PAIN. MAY REPEAT 5 MINUTES APART 3 TIMES IF NEEDED 25 tablet 0  .  Polyethyl Glycol-Propyl Glycol (SYSTANE OP) Place 1 drop into both eyes daily as needed (burning eyes).     . Promethazine-Codeine 6.25-10 MG/5ML SOLN TAKE 5ML BY MOUTH EVERY 4 HOURS AS NEEDED (Patient taking differently: Take 5 mLs by mouth every 4 (four) hours as needed (cough). ) 150 mL 0  . vitamin B-12 (CYANOCOBALAMIN) 1000 MCG tablet Take 1,000 mcg by mouth daily.     Facility-Administered Medications Prior to Visit  Medication Dose Route Frequency Provider Last Rate Last Dose  . sodium chloride flush (NS) 0.9 % injection 3 mL  3 mL Intravenous Q12H Deboraha Sprang, MD        ROS: Review of Systems  Constitutional: Positive for fatigue. Negative for appetite change and unexpected weight change.  HENT: Negative for congestion, nosebleeds, sneezing, sore throat and trouble swallowing.   Eyes: Negative for itching and visual disturbance.  Respiratory: Positive for cough.   Cardiovascular: Negative for chest pain, palpitations and leg swelling.  Gastrointestinal: Negative for abdominal distention, blood in stool, diarrhea and nausea.  Genitourinary: Negative for frequency and hematuria.  Musculoskeletal: Negative for back pain, gait problem, joint swelling and neck pain.  Skin: Negative for rash and wound.  Neurological: Positive for weakness. Negative for dizziness, tremors and speech difficulty.  Psychiatric/Behavioral: Negative  for agitation, dysphoric mood and sleep disturbance. The patient is not nervous/anxious.     Objective:  BP 124/76 (BP Location: Left Arm, Patient Position: Sitting, Cuff Size: Normal)   Pulse 63   Temp 97.6 F (36.4 C) (Oral)   Ht 5\' 7"  (1.702 m)   Wt 140 lb (63.5 kg)   SpO2 98%   BMI 21.93 kg/m   BP Readings from Last 3 Encounters:  05/24/19 124/76  05/14/19 (!) 153/80  05/11/19 140/64    Wt Readings from Last 3 Encounters:  05/24/19 140 lb (63.5 kg)  05/14/19 142 lb (64.4 kg)  05/11/19 142 lb 1.9 oz (64.5 kg)    Physical Exam  Constitutional:      General: He is not in acute distress.    Appearance: He is well-developed.     Comments: NAD  Eyes:     Conjunctiva/sclera: Conjunctivae normal.     Pupils: Pupils are equal, round, and reactive to light.  Neck:     Musculoskeletal: Normal range of motion.     Thyroid: No thyromegaly.     Vascular: No JVD.  Cardiovascular:     Rate and Rhythm: Bradycardia present. Rhythm irregular.     Heart sounds: Normal heart sounds. No murmur. No friction rub. No gallop.   Pulmonary:     Effort: Pulmonary effort is normal. No respiratory distress.     Breath sounds: Normal breath sounds. No wheezing or rales.  Chest:     Chest wall: No tenderness.  Abdominal:     General: Bowel sounds are normal. There is no distension.     Palpations: Abdomen is soft. There is no mass.     Tenderness: There is no abdominal tenderness. There is no guarding or rebound.  Musculoskeletal: Normal range of motion.        General: No tenderness.  Lymphadenopathy:     Cervical: No cervical adenopathy.  Skin:    General: Skin is warm and dry.     Findings: No rash.  Neurological:     Mental Status: He is alert and oriented to person, place, and time.     Cranial Nerves: No cranial nerve deficit.     Motor: No abnormal muscle tone.     Coordination: Coordination normal.     Gait: Gait normal.     Deep Tendon Reflexes: Reflexes are normal and symmetric.  Psychiatric:        Behavior: Behavior normal.        Thought Content: Thought content normal.        Judgment: Judgment normal.     Lab Results  Component Value Date   WBC 4.6 05/11/2019   HGB 12.5 (L) 05/11/2019   HCT 37.0 (L) 05/11/2019   PLT 101 (L) 05/11/2019   GLUCOSE 94 05/11/2019   CHOL 118 09/11/2016   TRIG 62.0 09/11/2016   HDL 49.60 09/11/2016   LDLCALC 56 09/11/2016   ALT 17 05/11/2019   AST 26 05/11/2019   NA 135 05/11/2019   K 5.3 (H) 05/11/2019   CL 98 05/11/2019   CREATININE 1.04 05/11/2019   BUN 14  05/11/2019   CO2 25 05/11/2019   TSH 1.57 01/29/2018   PSA 12.45 (H) 02/06/2016   INR 1.1 04/02/2017   HGBA1C 6.4 10/10/2017    No results found.  Assessment & Plan:   There are no diagnoses linked to this encounter.   No orders of the defined types were placed in this encounter.    Follow-up:  No follow-ups on file.  Walker Kehr, MD

## 2019-05-24 NOTE — Assessment & Plan Note (Signed)
CFS - worse Labs

## 2019-05-25 LAB — HEPATIC FUNCTION PANEL
ALT: 14 U/L (ref 0–53)
AST: 24 U/L (ref 0–37)
Albumin: 4.1 g/dL (ref 3.5–5.2)
Alkaline Phosphatase: 113 U/L (ref 39–117)
Bilirubin, Direct: 0.2 mg/dL (ref 0.0–0.3)
Total Bilirubin: 0.7 mg/dL (ref 0.2–1.2)
Total Protein: 7.6 g/dL (ref 6.0–8.3)

## 2019-05-25 LAB — BASIC METABOLIC PANEL
BUN: 20 mg/dL (ref 6–23)
CO2: 26 mEq/L (ref 19–32)
Calcium: 10.8 mg/dL — ABNORMAL HIGH (ref 8.4–10.5)
Chloride: 100 mEq/L (ref 96–112)
Creatinine, Ser: 1.14 mg/dL (ref 0.40–1.50)
GFR: 61.11 mL/min (ref >60.00)
Glucose, Bld: 95 mg/dL (ref 70–99)
Potassium: 4.9 mEq/L (ref 3.5–5.1)
Sodium: 133 mEq/L — ABNORMAL LOW (ref 135–145)

## 2019-05-27 ENCOUNTER — Telehealth: Payer: Self-pay

## 2019-05-27 NOTE — Telephone Encounter (Signed)

## 2019-06-01 ENCOUNTER — Telehealth: Payer: Self-pay | Admitting: Pharmacist

## 2019-06-01 ENCOUNTER — Ambulatory Visit (INDEPENDENT_AMBULATORY_CARE_PROVIDER_SITE_OTHER): Payer: Medicare Other | Admitting: Internal Medicine

## 2019-06-01 ENCOUNTER — Encounter (HOSPITAL_COMMUNITY): Payer: Self-pay

## 2019-06-01 ENCOUNTER — Other Ambulatory Visit: Payer: Self-pay

## 2019-06-01 ENCOUNTER — Other Ambulatory Visit (HOSPITAL_COMMUNITY): Payer: Self-pay | Admitting: *Deleted

## 2019-06-01 VITALS — BP 144/64 | HR 70 | Ht 67.0 in | Wt 139.0 lb

## 2019-06-01 DIAGNOSIS — Z95810 Presence of automatic (implantable) cardiac defibrillator: Secondary | ICD-10-CM

## 2019-06-01 DIAGNOSIS — I200 Unstable angina: Secondary | ICD-10-CM

## 2019-06-01 DIAGNOSIS — I493 Ventricular premature depolarization: Secondary | ICD-10-CM

## 2019-06-01 DIAGNOSIS — I5022 Chronic systolic (congestive) heart failure: Secondary | ICD-10-CM | POA: Diagnosis not present

## 2019-06-01 DIAGNOSIS — I4819 Other persistent atrial fibrillation: Secondary | ICD-10-CM

## 2019-06-01 DIAGNOSIS — I255 Ischemic cardiomyopathy: Secondary | ICD-10-CM | POA: Diagnosis not present

## 2019-06-01 MED ORDER — MEXILETINE HCL 150 MG PO CAPS: 150.0000 mg | ORAL_CAPSULE | Freq: Two times a day (BID) | ORAL | 6 refills | Status: DC

## 2019-06-01 NOTE — Telephone Encounter (Signed)
Medication list reviewed in anticipation of upcoming Tikosyn initiation. Patient is taking alfuzosin which is QTc prolonging - will need to monitor QTc closely. Pt was also sent MyChart message today to advise him to stop mexiletine 2 days prior to Tikosyn start - will need to keep an eye on message to make sure pt has read instructions (message as of 7/7 states MyChart message has not been read yet).  Patient is anticoagulated on Eliquis 5mg  BID on the appropriate dose. Please ensure that patient has not missed any anticoagulation doses in the 3 weeks prior to Tikosyn initiation.   Patient will need to be counseled to avoid use of Benadryl while on Tikosyn and in the 2-3 days prior to Tikosyn initiation.

## 2019-06-01 NOTE — Progress Notes (Signed)
Patient Care Team: Plotnikov, Georgina Quint, MD as PCP - General (Internal Medicine) Wendall Stade, MD as PCP - Cardiology (Cardiology) Duke Salvia, MD as PCP - Electrophysiology (Cardiology) Lindell Noe Lehman Prom., MD (Urology) [name], MD as Consulting Physician (General Surgery) Pyrtle, Carie Caddy, MD as Consulting Physician (Gastroenterology) [name], MD as Referring Physician (Ophthalmology)   HPI  [name] Christensen is a 85 y.o. male Since last being seen in our clinic for ischemic cardiomyopathy, frequent PVCs, worsening LV function and chronic systolic heart failure persistent atrial fibrillation; the patient reports having lost his appetite in the last month.  This is been concurrent with an ongoing cough.  No  COVID neg Because of CP underwent cath>. As Below  Plan was to admit for dofetilide and DCCV;  He has scant energy     (2 toothpicks in a pickle) no orthopnea or nocturnal dyspnea.  Fatigue and shortness of breath, without chest pain and no edema DATE TEST PVC  1/15 Holter 13%  1/18 Holter 31%     DATE TEST EF   12/13 Cath   Patent Grafts  2/15 Echo   45-50 %   12/17 Echo   50-55 %   4/19 Echo 20-25%   10/19 Echo  25-30%   1/20 Echo  20-25%   6/20 LHC 20-25% LIMA-LADatretic SVG-OM,SVG-D with filling of LAD,SVG-OM patent   Date Cr K Mg Hgb  6/20 1.14 4.9 2.0(3/20) 12.5          Antiarrhythmics Date Reason stopped  amio 7/19              Records and Results Reviewed *  Past Medical History:  Diagnosis Date  . AICD (automatic cardioverter/defibrillator) present 04/07/2017  . Allergy   . Anxiety   . BPH (benign prostatic hypertrophy)    elevated PSA Dr. Lindell Noe Bx 2010  . Cardiac arrest [facility])    a. out-of-hospital arrest 11/13/2012 - EF 40-45%, patent grafts on cath, received St. Jude AICD.  [name] Carotid disease, bilateral (HCC)    a. 0-39% by doppers.  . Cataract    bil cataracts removed  . CHF (congestive heart  failure) (HCC)   . Coronary artery disease    a. s/p MI/CABG 2005. b. s/p cath at time of VF arrest 10/2013 - grafts patent.  . Cough   . Diverticulosis   . Elevated LFTs    a. 10/2012 felt due to cardiac arrest - Hepatitis C Ab reactive from 10/27/2012>>Hep C RNA PCR negative 10/26/2012.   [name] GERD (gastroesophageal reflux disease)   . Hyperlipidemia   . Hypertension   . Internal hemorrhoids   . Myocardial infarction (HCC)    2005 - CABG x 5 2005  . RBBB   . Tubular adenoma of colon   . Ventricular bigeminy    a. Event monitor 01/2013: NSR with PVCs and occ bigeminy.    Past Surgical History:  Procedure Laterality Date  . APPENDECTOMY    . CARDIAC CATHETERIZATION  2007   with patent graft anatomy atretic left internal mammary  artery to the LAD which is nonobstructive. Will restart study June 08, 2007  . CARDIOVERSION N/A 03/27/2018   Procedure: CARDIOVERSION;  Surgeon: Iran Ouch, MD;  Location: [facility] ORS;  Service: Cardiovascular;  Laterality: N/A;  . CARDIOVERSION N/A 01/04/2019   Procedure: CARDIOVERSION (CATH LAB);  Surgeon: Iran Ouch, MD;  Location: [facility] ORS;  Service: Cardiovascular;  Laterality: N/A;  .  CATARACT EXTRACTION W/PHACO Right 04/23/2016   Procedure: CATARACT EXTRACTION PHACO AND INTRAOCULAR LENS PLACEMENT (IOC);  Surgeon: [name], MD;  Location: [facility] ORS;  Service: Ophthalmology;  Laterality: Right;  [place] 1.09 AP% 19.3 CDE 13.38 Fluid pack lot # [id number]  . CHOLECYSTECTOMY N/A 06/05/2017   Procedure: LAPAROSCOPIC CHOLECYSTECTOMY WITH INTRAOPERATIVE CHOLANGIOGRAM ERAS PATHWAY POSSIBLE NEEDLE CORE BIOPSY OF LIVER;  Surgeon: [name], MD;  Location: [facility] OR;  Service: General;  Laterality: N/A;  ERAS PATHWAY  . COLONOSCOPY    . CORONARY ARTERY BYPASS GRAFT  2005  . EYE SURGERY    . ICD GENERATOR CHANGEOUT N/A 04/07/2017   Procedure: ICD Generator Changeout;  Surgeon: Duke Salvia, MD;  Location[facility] INVASIVE CV LAB;  Service: Cardiovascular;   Laterality: N/A;  . IMPLANTABLE CARDIOVERTER DEFIBRILLATOR IMPLANT N/A 11/19/2012   STJ single chamber ICD implanted by Dr Graciela Husbands for cardiac arrest   . INGUINAL HERNIA REPAIR Bilateral 01/17/2015   Procedure: BILATERAL LAPAROSCOPIC INGUINAL HERNIA REPAIR WITH LEFT FEMORAL HERNIA REPAIR;  Surgeon: [name], MD;  Location: [facility] OR;  Service: General;  Laterality: Bilateral;  . INSERTION OF MESH Bilateral 01/17/2015   Procedure: INSERTION OF MESH;  Surgeon: [name], MD;  Location: [facility] OR;  Service: General;  Laterality: Bilateral;  . LAPAROSCOPIC CHOLECYSTECTOMY  06/05/2017  . LEAD INSERTION N/A 04/07/2017   Procedure: RA Lead Insertion;  Surgeon: Duke Salvia, MD;  Location: [facility] INVASIVE CV LAB;  Service: Cardiovascular;  Laterality: N/A;  . LEAD REVISION/REPAIR N/A 04/08/2017   Procedure: Atrial Lead Revision/Repair;  Surgeon: Duke Salvia, MD;  Location: [facility] INVASIVE CV LAB;  Service: Cardiovascular;  Laterality: N/A;  . LEFT HEART CATH AND CORONARY ANGIOGRAPHY Left 05/14/2019   Procedure: LEFT HEART CATH AND CORONARY ANGIOGRAPHY;  Surgeon: Iran Ouch, MD;  Location: [facility] INVASIVE CV LAB;  Service: Cardiovascular;  Laterality: Left;  . LEFT HEART CATHETERIZATION WITH CORONARY/GRAFT ANGIOGRAM N/A 10/26/2012   Procedure: LEFT HEART CATHETERIZATION WITH Isabel Caprice;  Surgeon: [name], MD;  Location: [facility] CATH LAB;  Service: Cardiovascular;  Laterality: N/A;  . LIVER BIOPSY N/A 06/05/2017   Procedure: SINGLE SITE LIVER BIOPSY ERAS PATHWAY;  Surgeon: [name], MD;  Location: [facility] OR;  Service: General;  Laterality: N/A;  ERAS PATHWAY  . LUMBAR FUSION  09/2007    No outpatient medications have been marked as taking for the 06/01/19 encounter (Office Visit) with Duke Salvia, MD.   Current Facility-Administered Medications for the 06/01/19 encounter (Office Visit) with Duke Salvia, MD  Medication  . sodium chloride flush (NS) 0.9 % injection 3 mL   Current  Outpatient Medications on File Prior to Visit  Medication Sig Dispense Refill  . acetaminophen (TYLENOL) 500 MG tablet Take 500 mg by mouth at bedtime.    [name] alfuzosin (UROXATRAL) 10 MG 24 hr tablet Take 10 mg by mouth every evening.     [name] apixaban (ELIQUIS) 5 MG TABS tablet Take 1 tablet (5 mg total) by mouth 2 (two) times daily. 180 tablet 3  . atorvastatin (LIPITOR) 20 MG tablet TAKE 1 TABLET BY MOUTH ONCE DAILY AT 6PM (Patient taking differently: Take 20 mg by mouth daily. TAKE 1 TABLET BY MOUTH ONCE DAILY AT 6P) 90 tablet 1  . cefUROXime (CEFTIN) 250 MG tablet Take 1 tablet (250 mg total) by mouth 2 (two) times daily for 10 days. 20 tablet 0  . Cholecalciferol (VITAMIN D) 50 MCG (2000 UT) tablet Take 2,000 Units by mouth every evening.    [name]  dutasteride (AVODART) 0.5 MG capsule Take 0.5 mg by mouth every evening.     [name] LINZESS 290 MCG CAPS capsule TAKE 1 CAPSULE BY MOUTH DAILY AS NEEDED (Patient taking differently: Take 290 mcg by mouth daily as needed (constipation). ) 30 capsule 11  . losartan (COZAAR) 50 MG tablet Take 1 tablet (50 mg total) by mouth daily. (Patient taking differently: Take 50 mg by mouth every evening. ) 90 tablet 3  . metoprolol succinate (TOPROL-XL) 25 MG 24 hr tablet TAKE 1 TABLET BY MOUTH ONCE DAILY (Patient taking differently: Take 25 mg by mouth daily. ) 90 tablet 2  . mexiletine (MEXITIL) 150 MG capsule TAKE 1 CAPSULE BY MOUTH TWICE DAILY (Patient taking differently: Take 150 mg by mouth 2 (two) times daily. ) 60 capsule 6  . Multiple Vitamin (MULTIVITAMIN WITH MINERALS) TABS tablet Take 1 tablet by mouth every other day.    . nitroGLYCERIN (NITROSTAT) 0.4 MG SL tablet DISSOLVE 1 TABLET UNDER TONGUE AS NEEDEDFOR CHEST PAIN. MAY REPEAT 5 MINUTES APART 3 TIMES IF NEEDED 25 tablet 0  . Polyethyl Glycol-Propyl Glycol (SYSTANE OP) Place 1 drop into both eyes daily as needed (burning eyes).     . Promethazine-Codeine 6.25-10 MG/5ML SOLN TAKE BY MOUTH EVERY 4 HOURS AS  NEEDED (Patient taking differently: Take 5 mLs by mouth every 4 (four) hours as needed (cough). ) 150 mL 0  . vitamin B-12 (CYANOCOBALAMIN) 1000 MCG tablet Take 1,000 mcg by mouth daily.     Current Facility-Administered Medications on File Prior to Visit  Medication Dose Route Frequency Provider Last Rate Last Dose  . sodium chloride flush (NS) 0.9 % injection 3 mL  3 mL Intravenous Q12H Duke Salvia, MD         Allergies  Allergen Reactions  . Ezetimibe Other (See Comments)    Pt was in coma for 6 days, numbness  . Penicillins Other (See Comments)    BLISTERS BETWEEN FINGERS Did it involve swelling of the face/tongue/throat, SOB, or low BP? No Did it involve sudden or severe rash/hives, skin peeling, or any reaction on the inside of your mouth or nose? Yes Did you need to seek medical attention at a hospital or doctor's office? No When did it last happen?60 years If all above answers are "NO", may proceed with cephalosporin use.   . Pravastatin Sodium Other (See Comments)    Aches and pains in joints  . Rosuvastatin Other (See Comments)    MYALGIAS  . Niacin Anxiety and Other (See Comments)    "Makes me feel nervous, jittery"      Review of Systems negative except from HPI and PMH  Physical Exam BP (!) 144/64 (BP Location: Left Arm, Patient Position: Sitting, Cuff Size: Normal)   Pulse 70   Ht 5\' 7"  (1.702 m)   Wt 139 lb (63 kg)   BMI 21.77 kg/m  Well developed and well nourished in no acute distress HENT normal Neck supple with JVP-flat Clear Device pocket well healed; without hematoma or erythema.  There is no tethering  irregulaly irregular RR Abd-soft with active BS No Clubbing cyanosis   edema Skin-warm and dry A & Oriented  Grossly normal sensory and motor function  ECG Atrial fib/flutter CVR RBBB PVCs freq R Bundle Superior Axis        ASSESSMENT & PLAN:    Atrial fibrillation/flutter-persistent  PVCs-right bundle branch superior axis  identified by the device with decreased ventricular pacing  Cardiomyopathy recurrent  History  of aborted sudden cardiac death    Implantable defibrillator-St. Jude   upgraded single-dual-chamber May 2018  Ischemic cardiomyopathy with prior CABG    Congestive Heart Failure chronic systolic  Right bundle branch block.  Sinus bradycardia/chronotropic incompetenced   Persistent atrial fibrillation with a controlled ventricular response.  We will plan to admit him for dofetilide loading and cardioversion.  He has failed amiodarone. Hopefully we will also get suppression of his high burden of PVCs in the range of 20-30%.  Would anticipate adjunctive ranolazine.  We will also ask Dr. Fawn Kirk to think with me about catheter ablation in the event that we are unable to suppress his ventricular ectopy in this elderly but otherwise relatively healthy gentleman  Catheterization was surprisingly unchanged.  Discussed Risks and benefits and possible need for DCCV    Current medicines are reviewed at length with the patient today.   The patient does not have concerns regarding his medicines.  The following changes were made today:    Labs/ tests ordered today include:  We spent more than 50% of our >25 min visit in face to face counseling regarding the above  Spoke with wife by phone   Sherryl Manges, MD  06/01/2019 9:16 AM     [facility] [address] [place] [address] [phone] (office) [phone] (fax)

## 2019-06-01 NOTE — Patient Instructions (Signed)
Medication Instructions:  - Your physician recommends that you continue on your current medications as directed. Please refer to the Current Medication list given to you today.  If you need a refill on your cardiac medications before your next appointment, please call your pharmacy.   Lab work: - none ordered  If you have labs (blood work) drawn today and your tests are completely normal, you will receive your results only by: [name] MyChart Message (if you have MyChart) OR . A paper copy in the mail If you have any lab test that is abnormal or we need to change your treatment, we will call you to review the results.  Testing/Procedures: - none ordered  Follow-Up: At [facility], you and your health needs are our priority.  As part of our continuing mission to provide you with exceptional heart care, we have created designated Provider Care Teams.  These Care Teams include your primary Cardiologist (physician) and Advanced Practice Providers (APPs -  Physician Assistants and Nurse Practitioners) who all work together to provide you with the care you need, when you need it. . pending Tikosyn admission  Any Other Special Instructions Will Be Listed Below (If Applicable). - A message has been sent to the [place], to contact you about your Tikosyn admission.

## 2019-06-02 NOTE — Telephone Encounter (Signed)
Patient called in this AM stating he missed his dose of eliquis last PM (7/8) will need to move out admission until after July 29th. Admit rescheduled for August 10th. Encouraged pt to set alarm to help remember to take medications appropriately as tikosyn requires meticulous administration twice a day without missed doses. Pt verbalized understanding.

## 2019-06-07 ENCOUNTER — Ambulatory Visit: Payer: Medicare Other | Admitting: Endocrinology

## 2019-06-09 ENCOUNTER — Other Ambulatory Visit: Payer: Self-pay | Admitting: Internal Medicine

## 2019-06-09 DIAGNOSIS — D2239 Melanocytic nevi of other parts of face: Secondary | ICD-10-CM | POA: Diagnosis not present

## 2019-06-09 DIAGNOSIS — L570 Actinic keratosis: Secondary | ICD-10-CM | POA: Diagnosis not present

## 2019-06-09 DIAGNOSIS — L578 Other skin changes due to chronic exposure to nonionizing radiation: Secondary | ICD-10-CM | POA: Diagnosis not present

## 2019-06-09 DIAGNOSIS — L812 Freckles: Secondary | ICD-10-CM | POA: Diagnosis not present

## 2019-06-09 DIAGNOSIS — D485 Neoplasm of uncertain behavior of skin: Secondary | ICD-10-CM | POA: Diagnosis not present

## 2019-06-09 DIAGNOSIS — Z1283 Encounter for screening for malignant neoplasm of skin: Secondary | ICD-10-CM | POA: Diagnosis not present

## 2019-06-09 DIAGNOSIS — D1800 Hemangioma unspecified site: Secondary | ICD-10-CM | POA: Diagnosis not present

## 2019-06-09 DIAGNOSIS — L821 Other seborrheic keratosis: Secondary | ICD-10-CM | POA: Diagnosis not present

## 2019-06-09 DIAGNOSIS — D225 Melanocytic nevi of trunk: Secondary | ICD-10-CM | POA: Diagnosis not present

## 2019-06-09 HISTORY — DX: Actinic keratosis: L57.0

## 2019-06-10 ENCOUNTER — Other Ambulatory Visit: Payer: Self-pay

## 2019-06-14 ENCOUNTER — Encounter: Payer: Self-pay | Admitting: Endocrinology

## 2019-06-14 ENCOUNTER — Ambulatory Visit (INDEPENDENT_AMBULATORY_CARE_PROVIDER_SITE_OTHER): Payer: Medicare Other | Admitting: Endocrinology

## 2019-06-14 ENCOUNTER — Other Ambulatory Visit: Payer: Self-pay

## 2019-06-14 DIAGNOSIS — I200 Unstable angina: Secondary | ICD-10-CM

## 2019-06-14 MED ORDER — CHOLECALCIFEROL 10 MCG (400 UNIT) PO TABS: 400.0000 [IU] | ORAL_TABLET | Freq: Every day | ORAL | 11 refills | Status: DC

## 2019-06-14 NOTE — Progress Notes (Signed)
Subjective:    Patient ID: [name] Christensen, male    DOB: August 15, 1932, 85 y.o.   MRN: [id number]  HPI Pt returns for f/u of hypocalcuric hypocalcemia (dx'ed 2016 (Ca++ was normal in 2015); other hypercalcemia w/u was neg).  pt states he feels well in general.  He has AF.  He takes vit-D, 1000 units per day.  Past Medical History:  Diagnosis Date  . AICD (automatic cardioverter/defibrillator) present 04/07/2017  . Allergy   . Anxiety   . BPH (benign prostatic hypertrophy)    elevated PSA Dr. Lindell Noe Bx 2010  . Cardiac arrest [facility])    a. out-of-hospital arrest 11/21/2012 - EF 40-45%, patent grafts on cath, received [employer] AICD.  [name] Carotid disease, bilateral (HCC)    a. 0-39% by doppers.  . Cataract    bil cataracts removed  . CHF (congestive heart failure) (HCC)   . Coronary artery disease    a. s/p MI/CABG 2005. b. s/p cath at time of VF arrest 10/2013 - grafts patent.  . Cough   . Diverticulosis   . Elevated LFTs    a. 10/2012 felt due to cardiac arrest - Hepatitis C Ab reactive from 11/21/2012>>Hep C RNA PCR negative 10/29/2012.   [name] GERD (gastroesophageal reflux disease)   . Hyperlipidemia   . Hypertension   . Internal hemorrhoids   . Myocardial infarction (HCC)    2005 - CABG x 5 2005  . RBBB   . Tubular adenoma of colon   . Ventricular bigeminy    a. Event monitor 01/2013: NSR with PVCs and occ bigeminy.    Past Surgical History:  Procedure Laterality Date  . APPENDECTOMY    . CARDIAC CATHETERIZATION  2007   with patent graft anatomy atretic left internal mammary  artery to the LAD which is nonobstructive. Will restart study June 08, 2007  . CARDIOVERSION N/A 03/27/2018   Procedure: CARDIOVERSION;  Surgeon: Iran Ouch, MD;  Location: [facility] ORS;  Service: Cardiovascular;  Laterality: N/A;  . CARDIOVERSION N/A 01/04/2019   Procedure: CARDIOVERSION (CATH LAB);  Surgeon: Iran Ouch, MD;  Location: [facility] ORS;  Service: Cardiovascular;  Laterality: N/A;  . CATARACT  EXTRACTION W/PHACO Right 04/23/2016   Procedure: CATARACT EXTRACTION PHACO AND INTRAOCULAR LENS PLACEMENT (IOC);  Surgeon: [name], MD;  Location: [facility] ORS;  Service: Ophthalmology;  Laterality: Right;  [place] 1.09 AP% 19.3 CDE 13.38 Fluid pack lot # [id number]  . CHOLECYSTECTOMY N/A 06/05/2017   Procedure: LAPAROSCOPIC CHOLECYSTECTOMY WITH INTRAOPERATIVE CHOLANGIOGRAM ERAS PATHWAY POSSIBLE NEEDLE CORE BIOPSY OF LIVER;  Surgeon: [name], MD;  Location: [facility] OR;  Service: General;  Laterality: N/A;  ERAS PATHWAY  . COLONOSCOPY    . CORONARY ARTERY BYPASS GRAFT  2005  . EYE SURGERY    . ICD GENERATOR CHANGEOUT N/A 04/07/2017   Procedure: ICD Generator Changeout;  Surgeon: Duke Salvia, MD;  Location: [facility] INVASIVE CV LAB;  Service: Cardiovascular;  Laterality: N/A;  . IMPLANTABLE CARDIOVERTER DEFIBRILLATOR IMPLANT N/A 11/22/2012   STJ single chamber ICD implanted by Dr Graciela Husbands for cardiac arrest   . INGUINAL HERNIA REPAIR Bilateral 01/17/2015   Procedure: BILATERAL LAPAROSCOPIC INGUINAL HERNIA REPAIR WITH LEFT FEMORAL HERNIA REPAIR;  Surgeon: [name], MD;  Location: [facility] OR;  Service: General;  Laterality: Bilateral;  . INSERTION OF MESH Bilateral 01/17/2015   Procedure: INSERTION OF MESH;  Surgeon: [name], MD;  Location: [facility] OR;  Service: General;  Laterality: Bilateral;  . LAPAROSCOPIC CHOLECYSTECTOMY  06/05/2017  . LEAD  INSERTION N/A 04/07/2017   Procedure: RA Lead Insertion;  Surgeon: Duke Salvia, MD;  Location: [facility] INVASIVE CV LAB;  Service: Cardiovascular;  Laterality: N/A;  . LEAD REVISION/REPAIR N/A 04/08/2017   Procedure: Atrial Lead Revision/Repair;  Surgeon: Duke Salvia, MD;  Location: [facility] INVASIVE CV LAB;  Service: Cardiovascular;  Laterality: N/A;  . LEFT HEART CATH AND CORONARY ANGIOGRAPHY Left 05/14/2019   Procedure: LEFT HEART CATH AND CORONARY ANGIOGRAPHY;  Surgeon: Iran Ouch, MD;  Location: [facility] INVASIVE CV LAB;  Service: Cardiovascular;  Laterality: Left;   . LEFT HEART CATHETERIZATION WITH CORONARY/GRAFT ANGIOGRAM N/A 10/29/2012   Procedure: LEFT HEART CATHETERIZATION WITH Isabel Caprice;  Surgeon: [name], MD;  Location: [facility] CATH LAB;  Service: Cardiovascular;  Laterality: N/A;  . LIVER BIOPSY N/A 06/05/2017   Procedure: SINGLE SITE LIVER BIOPSY ERAS PATHWAY;  Surgeon: [name], MD;  Location: [facility] OR;  Service: General;  Laterality: N/A;  ERAS PATHWAY  . LUMBAR FUSION  09/2007    Social History   Socioeconomic History  . Marital status: Married    Spouse name: Not on file  . Number of children: 5  . Years of education: Not on file  . Highest education level: Not on file  Occupational History  . 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 Financial resource strain: Not hard at all  . Food insecurity    Worry: Never true    Inability: Never true  . Transportation needs    Medical: No    Non-medical: No  Tobacco Use  . Smoking status: Never Smoker  . Smokeless tobacco: Never Used  Substance and Sexual Activity  . Alcohol use: No  . Drug use: No  . Sexual activity: Never  Lifestyle  . Physical activity    Days per week: 6 days    Minutes per session: 50 min  . Stress: Not at all  Relationships  . Social connections    Talks on phone: More than three times a week    Gets together: More than three times a week    Attends religious service: More than 4 times per year    Active member of club or organization: Yes    Attends meetings of clubs or organizations: More than 4 times per year    Relationship status: Married  . Intimate partner violence    Fear of current or ex partner: No    Emotionally abused: No    Physically abused: No    Forced sexual activity: No  Other Topics Concern  . Not on file  Social History Narrative  . Not on file    Current Outpatient Medications on File Prior to Visit  Medication Sig Dispense Refill  . acetaminophen (TYLENOL) 500 MG tablet Take 500  mg by mouth at bedtime.    [name] alfuzosin (UROXATRAL) 10 MG 24 hr tablet Take 10 mg by mouth every evening.     [name] apixaban (ELIQUIS) 5 MG TABS tablet Take 1 tablet (5 mg total) by mouth 2 (two) times daily. 180 tablet 3  . atorvastatin (LIPITOR) 20 MG tablet TAKE 1 TABLET BY MOUTH ONCE DAILY AT 6PM (Patient taking differently: Take 20 mg by mouth daily. TAKE 1 TABLET BY MOUTH ONCE DAILY AT 6P) 90 tablet 1  . dutasteride (AVODART) 0.5 MG capsule Take 0.5 mg by mouth every evening.     [name] LINZESS 290 MCG CAPS capsule TAKE 1 CAPSULE BY MOUTH DAILY AS NEEDED (  Patient taking differently: Take 290 mcg by mouth daily as needed (constipation). ) 30 capsule 11  . losartan (COZAAR) 50 MG tablet Take 1 tablet (50 mg total) by mouth daily. (Patient taking differently: Take 50 mg by mouth every evening. ) 90 tablet 3  . metoprolol succinate (TOPROL-XL) 25 MG 24 hr tablet TAKE 1 TABLET BY MOUTH ONCE DAILY (Patient taking differently: Take 25 mg by mouth daily. ) 90 tablet 2  . mexiletine (MEXITIL) 150 MG capsule Take 1 capsule (150 mg total) by mouth 2 (two) times daily. 60 capsule 6  . Multiple Vitamin (MULTIVITAMIN WITH MINERALS) TABS tablet Take 1 tablet by mouth every other day.    . nitroGLYCERIN (NITROSTAT) 0.4 MG SL tablet DISSOLVE 1 TABLET UNDER TONGUE AS NEEDEDFOR CHEST PAIN. MAY REPEAT 5 MINUTES APART 3 TIMES IF NEEDED 25 tablet 0  . Polyethyl Glycol-Propyl Glycol (SYSTANE OP) Place 1 drop into both eyes daily as needed (burning eyes).     . vitamin B-12 (CYANOCOBALAMIN) 1000 MCG tablet Take 1,000 mcg by mouth daily.     Current Facility-Administered Medications on File Prior to Visit  Medication Dose Route Frequency Provider Last Rate Last Dose  . sodium chloride flush (NS) 0.9 % injection 3 mL  3 mL Intravenous Q12H Duke Salvia, MD        Allergies  Allergen Reactions  . Ezetimibe Other (See Comments)    Pt was in coma for 6 days, numbness  . Penicillins Other (See Comments)    BLISTERS BETWEEN  FINGERS Did it involve swelling of the face/tongue/throat, SOB, or low BP? No Did it involve sudden or severe rash/hives, skin peeling, or any reaction on the inside of your mouth or nose? Yes Did you need to seek medical attention at a hospital or doctor's office? No When did it last happen?60 years If all above answers are "NO", may proceed with cephalosporin use.   . Pravastatin Sodium Other (See Comments)    Aches and pains in joints  . Rosuvastatin Other (See Comments)    MYALGIAS  . Niacin Anxiety and Other (See Comments)    "Makes me feel nervous, jittery"    Family History  Problem Relation Age of Onset  . Coronary artery disease Mother   . Heart attack Brother   . Stomach cancer Neg Hx   . Colon cancer Neg Hx   . Esophageal cancer Neg Hx   . Pancreatic cancer Neg Hx   . Prostate cancer Neg Hx   . Rectal cancer Neg Hx   . Hypercalcemia Neg Hx     BP (!) 100/40 (BP Location: Left Arm, Patient Position: Sitting, Cuff Size: Normal)   Pulse (!) 51   Ht 5\' 7"  (1.702 m)   Wt 140 lb 3.2 oz (63.6 kg)   SpO2 94%   BMI 21.96 kg/m    Review of Systems Denies muscle weakness    Objective:   Physical Exam VITAL SIGNS:  See vs page GENERAL: no distress Gait: normal and steady.    Lab Results  Component Value Date   TSH 2.65 05/24/2019     Lab Results  Component Value Date   PTH 53 09/22/2018   CALCIUM 10.8 (H) 05/24/2019   CAION 1.40 (H) 04/06/2013   PHOS 2.6 11/20/2012   Vit-D=63    Assessment & Plan:  Hypercalcemic hypocalcuria: no rx needed, as it is mild.  AF: this would not be worsened by mild hypercalcemia.  However, if cardiol wants, I would  be happy to rx low-dose cinacalcet.    Patient Instructions  Your are retaining calcium because your kidneys do not eliminate it well.   Please reduce the vitamin-D pill to 400 units per day. Besides this, no treatment is needed for the calcium.   Please come back for a follow-up appointment in 1 year.

## 2019-06-14 NOTE — Patient Instructions (Addendum)
Your are retaining calcium because your kidneys do not eliminate it well.   Please reduce the vitamin-D pill to 400 units per day. Besides this, no treatment is needed for the calcium.   Please come back for a follow-up appointment in 1 year.

## 2019-06-15 ENCOUNTER — Other Ambulatory Visit: Payer: Self-pay | Admitting: Internal Medicine

## 2019-06-15 MED ORDER — PROMETHAZINE-CODEINE 6.25-10 MG/5ML PO SYRP: 5.0000 mL | ORAL_SOLUTION | Freq: Four times a day (QID) | ORAL | 0 refills | Status: DC | PRN

## 2019-06-17 ENCOUNTER — Other Ambulatory Visit: Payer: Medicare Other

## 2019-06-21 ENCOUNTER — Ambulatory Visit (HOSPITAL_COMMUNITY): Payer: Medicare Other | Admitting: Nurse Practitioner

## 2019-06-29 ENCOUNTER — Other Ambulatory Visit: Payer: Self-pay

## 2019-06-29 ENCOUNTER — Encounter: Payer: Self-pay | Admitting: Internal Medicine

## 2019-06-29 ENCOUNTER — Ambulatory Visit (INDEPENDENT_AMBULATORY_CARE_PROVIDER_SITE_OTHER): Payer: Medicare Other | Admitting: Internal Medicine

## 2019-06-29 DIAGNOSIS — K219 Gastro-esophageal reflux disease without esophagitis: Secondary | ICD-10-CM

## 2019-06-29 DIAGNOSIS — R05 Cough: Secondary | ICD-10-CM

## 2019-06-29 DIAGNOSIS — I10 Essential (primary) hypertension: Secondary | ICD-10-CM

## 2019-06-29 DIAGNOSIS — E785 Hyperlipidemia, unspecified: Secondary | ICD-10-CM

## 2019-06-29 DIAGNOSIS — I200 Unstable angina: Secondary | ICD-10-CM

## 2019-06-29 DIAGNOSIS — R634 Abnormal weight loss: Secondary | ICD-10-CM

## 2019-06-29 DIAGNOSIS — I5032 Chronic diastolic (congestive) heart failure: Secondary | ICD-10-CM

## 2019-06-29 DIAGNOSIS — R059 Cough, unspecified: Secondary | ICD-10-CM

## 2019-06-29 MED ORDER — ESOMEPRAZOLE MAGNESIUM 10 MG PO PACK: 10.0000 mg | PACK | Freq: Every day | ORAL | 5 refills | Status: DC

## 2019-06-29 MED ORDER — LORATADINE 10 MG PO TABS: 10.0000 mg | ORAL_TABLET | Freq: Every day | ORAL | 11 refills | Status: DC

## 2019-06-29 MED ORDER — ESOMEPRAZOLE MAGNESIUM 20 MG PO CPDR: 20.0000 mg | DELAYED_RELEASE_CAPSULE | Freq: Every day | ORAL | 3 refills | Status: DC

## 2019-06-29 NOTE — Assessment & Plan Note (Signed)
Chronic   Toprol

## 2019-06-29 NOTE — Assessment & Plan Note (Signed)
Wt Readings from Last 3 Encounters:  06/29/19 138 lb (62.6 kg)  06/14/19 140 lb 3.2 oz (63.6 kg)  06/01/19 139 lb (63 kg)

## 2019-06-29 NOTE — Assessment & Plan Note (Signed)
Try Claritin and Nexium

## 2019-06-29 NOTE — Assessment & Plan Note (Signed)
Mexiletine

## 2019-06-29 NOTE — Assessment & Plan Note (Signed)
Lipitor 

## 2019-06-29 NOTE — Assessment & Plan Note (Signed)
Try OTC Nexium to help cough

## 2019-06-29 NOTE — Progress Notes (Signed)
Subjective:  Patient ID: [name] Christensen, male    DOB: 04/10/1932  Age: 85 y.o. MRN: [id number]  CC: No chief complaint on file.   HPI [name] Christensen presents for CAD, BPH, dyslipidemia f/u C/o chronic cough x4-5 months - clear  Outpatient Medications Prior to Visit  Medication Sig Dispense Refill  . acetaminophen (TYLENOL) 500 MG tablet Take 500 mg by mouth at bedtime.    [name] alfuzosin (UROXATRAL) 10 MG 24 hr tablet Take 10 mg by mouth every evening.     [name] apixaban (ELIQUIS) 5 MG TABS tablet Take 1 tablet (5 mg total) by mouth 2 (two) times daily. 180 tablet 3  . atorvastatin (LIPITOR) 20 MG tablet TAKE 1 TABLET BY MOUTH ONCE DAILY AT 6PM (Patient taking differently: Take 20 mg by mouth daily. TAKE 1 TABLET BY MOUTH ONCE DAILY AT 6P) 90 tablet 1  . cholecalciferol (VITAMIN D3) 10 MCG (400 UNIT) TABS tablet Take 1 tablet (400 Units total) by mouth daily. 30 tablet 11  . dutasteride (AVODART) 0.5 MG capsule Take 0.5 mg by mouth every evening.     [name] LINZESS 290 MCG CAPS capsule TAKE 1 CAPSULE BY MOUTH DAILY AS NEEDED (Patient taking differently: Take 290 mcg by mouth daily as needed (constipation). ) 30 capsule 11  . losartan (COZAAR) 50 MG tablet Take 1 tablet (50 mg total) by mouth daily. (Patient taking differently: Take 50 mg by mouth every evening. ) 90 tablet 3  . metoprolol succinate (TOPROL-XL) 25 MG 24 hr tablet TAKE 1 TABLET BY MOUTH ONCE DAILY (Patient taking differently: Take 25 mg by mouth daily. ) 90 tablet 2  . mexiletine (MEXITIL) 150 MG capsule Take 1 capsule (150 mg total) by mouth 2 (two) times daily. 60 capsule 6  . Multiple Vitamin (MULTIVITAMIN WITH MINERALS) TABS tablet Take 1 tablet by mouth every other day.    . nitroGLYCERIN (NITROSTAT) 0.4 MG SL tablet DISSOLVE 1 TABLET UNDER TONGUE AS NEEDEDFOR CHEST PAIN. MAY REPEAT 5 MINUTES APART 3 TIMES IF NEEDED 25 tablet 0  . Polyethyl Glycol-Propyl Glycol (SYSTANE OP) Place 1 drop into both eyes daily as needed (burning eyes).      . promethazine-codeine (PHENERGAN WITH CODEINE) 6.25-10 MG/5ML syrup Take 5 mLs by mouth every 6 (six) hours as needed. 240 mL 0  . vitamin B-12 (CYANOCOBALAMIN) 1000 MCG tablet Take 1,000 mcg by mouth daily.     Facility-Administered Medications Prior to Visit  Medication Dose Route Frequency Provider Last Rate Last Dose  . sodium chloride flush (NS) 0.9 % injection 3 mL  3 mL Intravenous Q12H Deboraha Sprang, MD        ROS: Review of Systems  Constitutional: Negative for appetite change, fatigue and unexpected weight change.  HENT: Negative for congestion, nosebleeds, sneezing, sore throat and trouble swallowing.   Eyes: Negative for itching and visual disturbance.  Respiratory: Positive for cough.   Cardiovascular: Negative for chest pain, palpitations and leg swelling.  Gastrointestinal: Negative for abdominal distention, blood in stool, diarrhea and nausea.  Genitourinary: Negative for frequency and hematuria.  Musculoskeletal: Negative for back pain, gait problem, joint swelling and neck pain.  Skin: Negative for rash.  Neurological: Negative for dizziness, tremors, speech difficulty and weakness.  Psychiatric/Behavioral: Negative for agitation, dysphoric mood, sleep disturbance and suicidal ideas. The patient is not nervous/anxious.     Objective:  BP (!) 142/86 (BP Location: Left Arm, Patient Position: Sitting, Cuff Size: Normal)   Pulse 64   Temp  97.6 F (36.4 C) (Oral)   Ht 5\' 7"  (1.702 m)   Wt 138 lb (62.6 kg)   SpO2 97%   BMI 21.61 kg/m   BP Readings from Last 3 Encounters:  06/29/19 (!) 142/86  06/14/19 (!) 100/40  06/01/19 (!) 144/64    Wt Readings from Last 3 Encounters:  06/29/19 138 lb (62.6 kg)  06/14/19 140 lb 3.2 oz (63.6 kg)  06/01/19 139 lb (63 kg)    Physical Exam Constitutional:      General: He is not in acute distress.    Appearance: He is well-developed.     Comments: NAD  Eyes:     Conjunctiva/sclera: Conjunctivae normal.      Pupils: Pupils are equal, round, and reactive to light.  Neck:     Musculoskeletal: Normal range of motion.     Thyroid: No thyromegaly.     Vascular: No JVD.  Cardiovascular:     Rate and Rhythm: Normal rate and regular rhythm.     Heart sounds: Normal heart sounds. No murmur. No friction rub. No gallop.   Pulmonary:     Effort: Pulmonary effort is normal. No respiratory distress.     Breath sounds: Normal breath sounds. No wheezing or rales.  Chest:     Chest wall: No tenderness.  Abdominal:     General: Bowel sounds are normal. There is no distension.     Palpations: Abdomen is soft. There is no mass.     Tenderness: There is no abdominal tenderness. There is no guarding or rebound.  Musculoskeletal: Normal range of motion.        General: No tenderness.  Lymphadenopathy:     Cervical: No cervical adenopathy.  Skin:    General: Skin is warm and dry.     Findings: No rash.  Neurological:     Mental Status: He is alert and oriented to person, place, and time.     Cranial Nerves: No cranial nerve deficit.     Motor: No abnormal muscle tone.     Coordination: Coordination normal.     Gait: Gait normal.     Deep Tendon Reflexes: Reflexes are normal and symmetric.  Psychiatric:        Behavior: Behavior normal.        Thought Content: Thought content normal.        Judgment: Judgment normal.   pacemaker  Lab Results  Component Value Date   WBC 4.9 05/24/2019   HGB 12.5 (L) 05/24/2019   HCT 38.0 (L) 05/24/2019   PLT 87.0 (L) 05/24/2019   GLUCOSE 95 05/24/2019   CHOL 118 09/11/2016   TRIG 62.0 09/11/2016   HDL 49.60 09/11/2016   LDLCALC 56 09/11/2016   ALT 14 05/24/2019   AST 24 05/24/2019   NA 133 (L) 05/24/2019   K 4.9 05/24/2019   CL 100 05/24/2019   CREATININE 1.14 05/24/2019   BUN 20 05/24/2019   CO2 26 05/24/2019   TSH 2.65 05/24/2019   PSA 12.45 (H) 02/06/2016   INR 1.1 04/02/2017   HGBA1C 6.4 10/10/2017    No results found.  Assessment & Plan:    There are no diagnoses linked to this encounter.   No orders of the defined types were placed in this encounter.    Follow-up: No follow-ups on file.  Walker Kehr, MD

## 2019-07-01 ENCOUNTER — Other Ambulatory Visit: Payer: Self-pay

## 2019-07-01 ENCOUNTER — Other Ambulatory Visit
Admission: RE | Admit: 2019-07-01 | Discharge: 2019-07-01 | Disposition: A | Discharge status: Home / Self Care | Payer: Medicare Other | Source: Ambulatory Visit | Attending: Physician Assistant | Admitting: Physician Assistant

## 2019-07-01 DIAGNOSIS — Z20828 Contact with and (suspected) exposure to other viral communicable diseases: Secondary | ICD-10-CM | POA: Insufficient documentation

## 2019-07-01 DIAGNOSIS — Z01812 Encounter for preprocedural laboratory examination: Secondary | ICD-10-CM | POA: Insufficient documentation

## 2019-07-01 LAB — SARS CORONAVIRUS 2 (TAT 6-24 HRS): SARS Coronavirus 2: NEGATIVE

## 2019-07-05 ENCOUNTER — Inpatient Hospital Stay (HOSPITAL_COMMUNITY)
Admission: RE | Admit: 2019-07-05 | Discharge: 2019-07-08 | DRG: 309 | Disposition: A | Discharge status: Home / Self Care | Payer: Medicare Other | Attending: Internal Medicine | Admitting: Internal Medicine

## 2019-07-05 ENCOUNTER — Ambulatory Visit (HOSPITAL_COMMUNITY)
Admission: RE | Admit: 2019-07-05 | Discharge: 2019-07-05 | Disposition: A | Discharge status: Home / Self Care | Payer: Medicare Other | Source: Ambulatory Visit | Attending: Nurse Practitioner | Admitting: Nurse Practitioner

## 2019-07-05 ENCOUNTER — Encounter (HOSPITAL_COMMUNITY): Payer: Self-pay | Admitting: Physician Assistant

## 2019-07-05 ENCOUNTER — Other Ambulatory Visit: Payer: Self-pay

## 2019-07-05 ENCOUNTER — Encounter (HOSPITAL_COMMUNITY): Payer: Self-pay

## 2019-07-05 VITALS — BP 142/62 | HR 71 | Ht 67.0 in | Wt 136.4 lb

## 2019-07-05 DIAGNOSIS — I4510 Unspecified right bundle-branch block: Secondary | ICD-10-CM

## 2019-07-05 DIAGNOSIS — I469 Cardiac arrest, cause unspecified: Secondary | ICD-10-CM | POA: Diagnosis not present

## 2019-07-05 DIAGNOSIS — I428 Other cardiomyopathies: Secondary | ICD-10-CM | POA: Diagnosis not present

## 2019-07-05 DIAGNOSIS — I480 Paroxysmal atrial fibrillation: Secondary | ICD-10-CM

## 2019-07-05 DIAGNOSIS — E785 Hyperlipidemia, unspecified: Secondary | ICD-10-CM | POA: Diagnosis present

## 2019-07-05 DIAGNOSIS — K219 Gastro-esophageal reflux disease without esophagitis: Secondary | ICD-10-CM | POA: Diagnosis present

## 2019-07-05 DIAGNOSIS — Z888 Allergy status to other drugs, medicaments and biological substances status: Secondary | ICD-10-CM

## 2019-07-05 DIAGNOSIS — Z95810 Presence of automatic (implantable) cardiac defibrillator: Secondary | ICD-10-CM | POA: Diagnosis not present

## 2019-07-05 DIAGNOSIS — I252 Old myocardial infarction: Secondary | ICD-10-CM | POA: Diagnosis not present

## 2019-07-05 DIAGNOSIS — I110 Hypertensive heart disease with heart failure: Secondary | ICD-10-CM | POA: Diagnosis present

## 2019-07-05 DIAGNOSIS — Z880 Allergy status to penicillin: Secondary | ICD-10-CM

## 2019-07-05 DIAGNOSIS — I255 Ischemic cardiomyopathy: Secondary | ICD-10-CM

## 2019-07-05 DIAGNOSIS — Z8249 Family history of ischemic heart disease and other diseases of the circulatory system: Secondary | ICD-10-CM

## 2019-07-05 DIAGNOSIS — I4891 Unspecified atrial fibrillation: Secondary | ICD-10-CM | POA: Diagnosis not present

## 2019-07-05 DIAGNOSIS — I5032 Chronic diastolic (congestive) heart failure: Secondary | ICD-10-CM | POA: Diagnosis not present

## 2019-07-05 DIAGNOSIS — I4819 Other persistent atrial fibrillation: Secondary | ICD-10-CM | POA: Diagnosis not present

## 2019-07-05 DIAGNOSIS — Z7901 Long term (current) use of anticoagulants: Secondary | ICD-10-CM | POA: Diagnosis not present

## 2019-07-05 DIAGNOSIS — Z79899 Other long term (current) drug therapy: Secondary | ICD-10-CM

## 2019-07-05 DIAGNOSIS — Z951 Presence of aortocoronary bypass graft: Secondary | ICD-10-CM

## 2019-07-05 DIAGNOSIS — I4892 Unspecified atrial flutter: Secondary | ICD-10-CM

## 2019-07-05 DIAGNOSIS — I493 Ventricular premature depolarization: Secondary | ICD-10-CM | POA: Diagnosis not present

## 2019-07-05 DIAGNOSIS — Z8674 Personal history of sudden cardiac arrest: Secondary | ICD-10-CM

## 2019-07-05 DIAGNOSIS — I2510 Atherosclerotic heart disease of native coronary artery without angina pectoris: Secondary | ICD-10-CM | POA: Diagnosis not present

## 2019-07-05 DIAGNOSIS — I5022 Chronic systolic (congestive) heart failure: Secondary | ICD-10-CM | POA: Diagnosis not present

## 2019-07-05 LAB — BASIC METABOLIC PANEL
Anion gap: 8 (ref 5–15)
BUN: 16 mg/dL (ref 8–23)
CO2: 27 mmol/L (ref 22–32)
Calcium: 10.8 mg/dL — ABNORMAL HIGH (ref 8.9–10.3)
Chloride: 98 mmol/L (ref 98–111)
Creatinine, Ser: 1.11 mg/dL (ref 0.61–1.24)
GFR calc Af Amer: >60 mL/min (ref >60)
GFR calc non Af Amer: >60 mL/min (ref >60)
Glucose, Bld: 122 mg/dL — ABNORMAL HIGH (ref 70–99)
Potassium: 4.3 mmol/L (ref 3.5–5.1)
Sodium: 133 mmol/L — ABNORMAL LOW (ref 135–145)

## 2019-07-05 LAB — MAGNESIUM: Magnesium: 2 mg/dL (ref 1.7–2.4)

## 2019-07-05 MED ORDER — ACETAMINOPHEN 500 MG PO TABS
500.0000 mg | ORAL_TABLET | Freq: Every day | ORAL | Status: DC
  Administered 2019-07-05 – 2019-07-07 (×3): 500 mg via ORAL
  Filled 2019-07-05 (×3): qty 1

## 2019-07-05 MED ORDER — POLYVINYL ALCOHOL 1.4 % OP SOLN
Freq: Every day | OPHTHALMIC | Status: DC | PRN
  Filled 2019-07-05: qty 15

## 2019-07-05 MED ORDER — CHOLECALCIFEROL 10 MCG (400 UNIT) PO TABS
400.0000 [IU] | ORAL_TABLET | Freq: Every day | ORAL | Status: DC
  Administered 2019-07-06 – 2019-07-08 (×2): 400 [IU] via ORAL
  Filled 2019-07-05 (×3): qty 1

## 2019-07-05 MED ORDER — SODIUM CHLORIDE 0.9 % IV SOLN: 250.0000 mL | INTRAVENOUS | Status: DC | PRN

## 2019-07-05 MED ORDER — PROMETHAZINE-CODEINE 6.25-10 MG/5ML PO SYRP: 5.0000 mL | ORAL_SOLUTION | Freq: Four times a day (QID) | ORAL | Status: DC | PRN

## 2019-07-05 MED ORDER — CHOLECALCIFEROL 10 MCG (400 UNIT) PO TABS: 400.0000 [IU] | ORAL_TABLET | Freq: Every day | ORAL | Status: DC

## 2019-07-05 MED ORDER — ATORVASTATIN CALCIUM 10 MG PO TABS: 20.0000 mg | ORAL_TABLET | Freq: Every day | ORAL | Status: DC

## 2019-07-05 MED ORDER — ADULT MULTIVITAMIN W/MINERALS CH: 1.0000 | ORAL_TABLET | ORAL | Status: DC

## 2019-07-05 MED ORDER — APIXABAN 5 MG PO TABS
5.0000 mg | ORAL_TABLET | Freq: Two times a day (BID) | ORAL | Status: DC
  Administered 2019-07-05 – 2019-07-08 (×6): 5 mg via ORAL
  Filled 2019-07-05 (×6): qty 1

## 2019-07-05 MED ORDER — LINACLOTIDE 145 MCG PO CAPS
290.0000 ug | ORAL_CAPSULE | Freq: Every day | ORAL | Status: DC | PRN
  Administered 2019-07-06: 290 ug via ORAL
  Filled 2019-07-05 (×3): qty 2

## 2019-07-05 MED ORDER — VITAMIN B-12 1000 MCG PO TABS
1000.0000 ug | ORAL_TABLET | Freq: Every day | ORAL | Status: DC
  Administered 2019-07-05 – 2019-07-07 (×3): 1000 ug via ORAL
  Filled 2019-07-05 (×3): qty 1

## 2019-07-05 MED ORDER — DOFETILIDE 250 MCG PO CAPS: 250.0000 ug | ORAL_CAPSULE | Freq: Two times a day (BID) | ORAL | Status: DC

## 2019-07-05 MED ORDER — DUTASTERIDE 0.5 MG PO CAPS
0.5000 mg | ORAL_CAPSULE | Freq: Every evening | ORAL | Status: DC
  Administered 2019-07-05 – 2019-07-07 (×2): 0.5 mg via ORAL
  Filled 2019-07-05 (×4): qty 1

## 2019-07-05 MED ORDER — DOFETILIDE 250 MCG PO CAPS
250.0000 ug | ORAL_CAPSULE | Freq: Two times a day (BID) | ORAL | Status: DC
  Administered 2019-07-06: 250 ug via ORAL
  Filled 2019-07-05: qty 1

## 2019-07-05 MED ORDER — LORATADINE 10 MG PO TABS
10.0000 mg | ORAL_TABLET | Freq: Every day | ORAL | Status: DC
  Administered 2019-07-06 – 2019-07-08 (×2): 10 mg via ORAL
  Filled 2019-07-05 (×2): qty 1

## 2019-07-05 MED ORDER — SODIUM CHLORIDE 0.9% FLUSH: 3.0000 mL | INTRAVENOUS | Status: DC | PRN

## 2019-07-05 MED ORDER — ALFUZOSIN HCL ER 10 MG PO TB24
10.0000 mg | ORAL_TABLET | Freq: Every evening | ORAL | Status: DC
  Administered 2019-07-05 – 2019-07-07 (×2): 10 mg via ORAL
  Filled 2019-07-05 (×2): qty 1

## 2019-07-05 MED ORDER — NITROGLYCERIN 0.4 MG SL SUBL: 0.4000 mg | SUBLINGUAL_TABLET | SUBLINGUAL | Status: DC | PRN

## 2019-07-05 MED ORDER — LORATADINE 10 MG PO TABS: 10.0000 mg | ORAL_TABLET | Freq: Every day | ORAL | Status: DC

## 2019-07-05 MED ORDER — METOPROLOL SUCCINATE ER 25 MG PO TB24
25.0000 mg | ORAL_TABLET | Freq: Every day | ORAL | Status: DC
  Administered 2019-07-06 – 2019-07-08 (×3): 25 mg via ORAL
  Filled 2019-07-05 (×3): qty 1

## 2019-07-05 MED ORDER — METOPROLOL SUCCINATE ER 25 MG PO TB24: 25.0000 mg | ORAL_TABLET | Freq: Every day | ORAL | Status: DC

## 2019-07-05 MED ORDER — ADULT MULTIVITAMIN W/MINERALS CH
1.0000 | ORAL_TABLET | ORAL | Status: DC
  Administered 2019-07-06 – 2019-07-08 (×2): 1 via ORAL
  Filled 2019-07-05 (×2): qty 1

## 2019-07-05 MED ORDER — DOFETILIDE 250 MCG PO CAPS
250.0000 ug | ORAL_CAPSULE | Freq: Once | ORAL | Status: AC
  Administered 2019-07-05: 250 ug via ORAL
  Filled 2019-07-05: qty 1

## 2019-07-05 MED ORDER — PANTOPRAZOLE SODIUM 40 MG PO TBEC
40.0000 mg | DELAYED_RELEASE_TABLET | Freq: Every day | ORAL | Status: DC
  Administered 2019-07-06 – 2019-07-08 (×3): 40 mg via ORAL
  Filled 2019-07-05 (×3): qty 1

## 2019-07-05 MED ORDER — VITAMIN B-12 1000 MCG PO TABS: 1000.0000 ug | ORAL_TABLET | Freq: Every day | ORAL | Status: DC

## 2019-07-05 MED ORDER — APIXABAN 5 MG PO TABS: 5.0000 mg | ORAL_TABLET | Freq: Two times a day (BID) | ORAL | Status: DC

## 2019-07-05 MED ORDER — ATORVASTATIN CALCIUM 10 MG PO TABS
20.0000 mg | ORAL_TABLET | Freq: Every day | ORAL | Status: DC
  Administered 2019-07-06 – 2019-07-07 (×2): 20 mg via ORAL
  Filled 2019-07-05 (×2): qty 2

## 2019-07-05 MED ORDER — SODIUM CHLORIDE 0.9% FLUSH
3.0000 mL | Freq: Two times a day (BID) | INTRAVENOUS | Status: DC
  Administered 2019-07-05 – 2019-07-07 (×3): 3 mL via INTRAVENOUS

## 2019-07-05 NOTE — Progress Notes (Signed)
Primary Care Physician: Tresa Garter, MD Referring Physician:Device clinic EP: Dr. Graciela Husbands Cardiologist: Dr. Leroy Libman is a 85 y.o. male with a h/o out of hospital cardiac arrest 2013, received St. Jude ICD, CAD, s/p CABG in 2005, PVC's. HTN, CHF that is in the afib clinic for new onset atrial flutter on device report. Pt is asymptomatic and does not feel any palpitations, possibly slightly more fatigued, but overall has not changed much form his baseline. He has been on asa and plavix for years for his h/o CAD. He does not drink alcohol, no tobacco, no excessive caffeine, pt states that he does not snore or stop breathing while sleeping.   Patient presents today for dofetilide loading. He remains in atrial flutter today with continued symptoms of SOB and fatigue. He denies any missed doses of anticoagulation in the last three weeks. Recent LHC showed no change in his coronary disease.   Today, he denies symptoms of palpitations, chest pain, orthopnea, PND, lower extremity edema, dizziness, presyncope, syncope, or neurologic sequela. The patient is tolerating medications without difficulties and is otherwise without complaint today.   Past Medical History:  Diagnosis Date   AICD (automatic cardioverter/defibrillator) present 04/07/2017   Allergy    Anxiety    BPH (benign prostatic hypertrophy)    elevated PSA Dr. Lindell Noe Bx 2010   Cardiac arrest [facility])    a. out-of-hospital arrest 10/25/2012 - EF 40-45%, patent grafts on cath, received St. Jude AICD.   Carotid disease, bilateral (HCC)    a. 0-39% by doppers.   Cataract    bil cataracts removed   CHF (congestive heart failure) (HCC)    Coronary artery disease    a. s/p MI/CABG 2005. b. s/p cath at time of VF arrest 10/2013 - grafts patent.   Cough    Diverticulosis    Elevated LFTs    a. 10/2012 felt due to cardiac arrest - Hepatitis C Ab reactive from 10/30/2012>>Hep C RNA PCR negative 10/26/2012.     GERD (gastroesophageal reflux disease)    Hyperlipidemia    Hypertension    Internal hemorrhoids    Myocardial infarction (HCC)    2005 - CABG x 5 2005   RBBB    Tubular adenoma of colon    Ventricular bigeminy    a. Event monitor 01/2013: NSR with PVCs and occ bigeminy.   Past Surgical History:  Procedure Laterality Date   APPENDECTOMY     CARDIAC CATHETERIZATION  2007   with patent graft anatomy atretic left internal mammary  artery to the LAD which is nonobstructive. Will restart study June 08, 2007   CARDIOVERSION N/A 03/27/2018   Procedure: CARDIOVERSION;  Surgeon: Iran Ouch, MD;  Location: [facility] ORS;  Service: Cardiovascular;  Laterality: N/A;   CARDIOVERSION N/A 01/04/2019   Procedure: CARDIOVERSION (CATH LAB);  Surgeon: Iran Ouch, MD;  Location: [facility] ORS;  Service: Cardiovascular;  Laterality: N/A;   CATARACT EXTRACTION W/PHACO Right 04/23/2016   Procedure: CATARACT EXTRACTION PHACO AND INTRAOCULAR LENS PLACEMENT (IOC);  Surgeon: [name], MD;  Location: [facility] ORS;  Service: Ophthalmology;  Laterality: Right;  [place] 1.09 AP% 19.3 CDE 13.38 Fluid pack lot # [id number]   CHOLECYSTECTOMY N/A 06/05/2017   Procedure: LAPAROSCOPIC CHOLECYSTECTOMY WITH INTRAOPERATIVE CHOLANGIOGRAM ERAS PATHWAY POSSIBLE NEEDLE CORE BIOPSY OF LIVER;  Surgeon: [name], MD;  Location: [facility] OR;  Service: General;  Laterality: N/A;  ERAS PATHWAY   COLONOSCOPY     CORONARY ARTERY BYPASS GRAFT  2005   EYE SURGERY     ICD GENERATOR CHANGEOUT N/A 04/07/2017   Procedure: ICD Generator Changeout;  Surgeon: Duke Salvia, MD;  Location: [facility] INVASIVE CV LAB;  Service: Cardiovascular;  Laterality: N/A;   IMPLANTABLE CARDIOVERTER DEFIBRILLATOR IMPLANT N/A 10/25/2012   STJ single chamber ICD implanted by Dr Graciela Husbands for cardiac arrest    INGUINAL HERNIA REPAIR Bilateral 01/17/2015   Procedure: BILATERAL LAPAROSCOPIC INGUINAL HERNIA REPAIR WITH LEFT FEMORAL HERNIA REPAIR;  Surgeon:  [name], MD;  Location: [facility] OR;  Service: General;  Laterality: Bilateral;   INSERTION OF MESH Bilateral 01/17/2015   Procedure: INSERTION OF MESH;  Surgeon: [name], MD;  Location: [facility] OR;  Service: General;  Laterality: Bilateral;   LAPAROSCOPIC CHOLECYSTECTOMY  06/05/2017   LEAD INSERTION N/A 04/07/2017   Procedure: RA Lead Insertion;  Surgeon: Duke Salvia, MD;  Location: [facility] INVASIVE CV LAB;  Service: Cardiovascular;  Laterality: N/A;   LEAD REVISION/REPAIR N/A 04/08/2017   Procedure: Atrial Lead Revision/Repair;  Surgeon: Duke Salvia, MD;  Location: [facility] INVASIVE CV LAB;  Service: Cardiovascular;  Laterality: N/A;   LEFT HEART CATH AND CORONARY ANGIOGRAPHY Left 05/14/2019   Procedure: LEFT HEART CATH AND CORONARY ANGIOGRAPHY;  Surgeon: Iran Ouch, MD;  Location: [facility] INVASIVE CV LAB;  Service: Cardiovascular;  Laterality: Left;   LEFT HEART CATHETERIZATION WITH CORONARY/GRAFT ANGIOGRAM N/A 11-29-2012   Procedure: LEFT HEART CATHETERIZATION WITH Isabel Caprice;  Surgeon: [name], MD;  Location: [facility] CATH LAB;  Service: Cardiovascular;  Laterality: N/A;   LIVER BIOPSY N/A 06/05/2017   Procedure: SINGLE SITE LIVER BIOPSY ERAS PATHWAY;  Surgeon: [name], MD;  Location: [facility] OR;  Service: General;  Laterality: N/A;  ERAS PATHWAY   LUMBAR FUSION  09/2007    Current Outpatient Medications  Medication Sig Dispense Refill   acetaminophen (TYLENOL) 500 MG tablet Take 500 mg by mouth at bedtime.     alfuzosin (UROXATRAL) 10 MG 24 hr tablet Take 10 mg by mouth every evening.      apixaban (ELIQUIS) 5 MG TABS tablet Take 1 tablet (5 mg total) by mouth 2 (two) times daily. 180 tablet 3   atorvastatin (LIPITOR) 20 MG tablet TAKE 1 TABLET BY MOUTH ONCE DAILY AT 6PM (Patient taking differently: Take 20 mg by mouth daily. TAKE 1 TABLET BY MOUTH ONCE DAILY AT 6P) 90 tablet 1   cholecalciferol (VITAMIN D3) 10 MCG (400 UNIT) TABS tablet Take 1 tablet (400 Units  total) by mouth daily. 30 tablet 11   dutasteride (AVODART) 0.5 MG capsule Take 0.5 mg by mouth every evening.      esomeprazole (NEXIUM) 20 MG capsule Take 1 capsule (20 mg total) by mouth daily at 12 noon. 30 capsule 3   LINZESS 290 MCG CAPS capsule TAKE 1 CAPSULE BY MOUTH DAILY AS NEEDED (Patient taking differently: Take 290 mcg by mouth daily as needed (constipation). ) 30 capsule 11   loratadine (CLARITIN) 10 MG tablet Take 1 tablet (10 mg total) by mouth daily. 30 tablet 11   metoprolol succinate (TOPROL-XL) 25 MG 24 hr tablet TAKE 1 TABLET BY MOUTH ONCE DAILY (Patient taking differently: Take 25 mg by mouth daily. ) 90 tablet 2   Multiple Vitamin (MULTIVITAMIN WITH MINERALS) TABS tablet Take 1 tablet by mouth every other day.     nitroGLYCERIN (NITROSTAT) 0.4 MG SL tablet DISSOLVE 1 TABLET UNDER TONGUE AS NEEDEDFOR CHEST PAIN. MAY REPEAT 5 MINUTES APART 3 TIMES IF NEEDED 25 tablet 0  Polyethyl Glycol-Propyl Glycol (SYSTANE OP) Place 1 drop into both eyes daily as needed (burning eyes).      promethazine-codeine (PHENERGAN WITH CODEINE) 6.25-10 MG/5ML syrup Take 5 mLs by mouth every 6 (six) hours as needed. 240 mL 0   vitamin B-12 (CYANOCOBALAMIN) 1000 MCG tablet Take 1,000 mcg by mouth daily.     Current Facility-Administered Medications  Medication Dose Route Frequency Provider Last Rate Last Dose   sodium chloride flush (NS) 0.9 % injection 3 mL  3 mL Intravenous Q12H Duke Salvia, MD        Allergies  Allergen Reactions   Ezetimibe Other (See Comments)    Pt was in coma for 6 days, numbness   Penicillins Other (See Comments)    BLISTERS BETWEEN FINGERS Did it involve swelling of the face/tongue/throat, SOB, or low BP? No Did it involve sudden or severe rash/hives, skin peeling, or any reaction on the inside of your mouth or nose? Yes Did you need to seek medical attention at a hospital or doctor's office? No When did it last happen?60 years If all above  answers are NO, may proceed with cephalosporin use.    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  Social Needs   Financial resource strain: Not hard at all   Food insecurity    Worry: Never true    Inability: Never true   Transportation needs    Medical: No    Non-medical: No  Tobacco Use   Smoking status: Never Smoker   Smokeless tobacco: Never Used  Substance and Sexual Activity   Alcohol use: No   Drug use: No   Sexual activity: Never  Lifestyle   Physical activity    Days per week: 6 days    Minutes per session: 50 min   Stress: Not at all  Relationships   Social connections    Talks on phone: More than three times a week    Gets together: More than three times a week    Attends religious service: More than 4 times per year    Active member of club or organization: Yes    Attends meetings of clubs or organizations: More than 4 times per year    Relationship status: Married   Intimate partner violence    Fear of current or ex partner: No    Emotionally abused: No    Physically abused: No    Forced sexual activity: No  Other Topics Concern   Not on file  Social History Narrative   Not on file    Family History  Problem Relation Age of Onset   Coronary artery disease Mother    Heart attack Brother    Stomach cancer Neg Hx    Colon cancer Neg Hx    Esophageal cancer Neg Hx    Pancreatic cancer Neg Hx    Prostate cancer Neg Hx    Rectal cancer Neg Hx    Hypercalcemia Neg Hx  ROS- All systems are  reviewed and negative except as per the HPI above  Physical Exam: Vitals:   07/05/19 1132  Weight: 61.9 kg  Height: 5\' 7"  (1.702 m)   Wt Readings from Last 3 Encounters:  07/05/19 61.9 kg  06/29/19 62.6 kg  06/14/19 63.6 kg    Labs: Lab Results  Component Value Date   NA 133 (L) 05/24/2019   K 4.9 05/24/2019   CL 100 05/24/2019   CO2 26 05/24/2019   GLUCOSE 95 05/24/2019   BUN 20 05/24/2019   CREATININE 1.14 05/24/2019   CALCIUM 10.8 (H) 05/24/2019   PHOS 2.6 11/21/2012   MG 2.0 01/26/2019   Lab Results  Component Value Date   INR 1.1 04/02/2017   Lab Results  Component Value Date   CHOL 118 09/11/2016   HDL 49.60 09/11/2016   LDLCALC 56 09/11/2016   TRIG 62.0 09/11/2016    GEN- The patient is well appearing elderly male, alert and oriented x 3 today.   HEENT-head normocephalic, atraumatic, sclera clear, conjunctiva pink, hearing intact, trachea midline. Lungs- Clear to ausculation bilaterally, normal work of breathing Heart- irregular rate and rhythm, no murmurs, rubs or gallops  GI- soft, NT, ND, + BS Extremities- no clubbing, cyanosis, or edema MS- no significant deformity or atrophy Skin- no rash or lesion Psych- euthymic mood, full affect Neuro- strength and sensation are intact   EKG- atrial flutter with variable block HR 71, frequent PVCs, RBBB, QRS 194, QTc 489  Echo 04/22/19 1. The left ventricle has severely reduced systolic function, with an ejection fraction of 25-30%. The cavity size was moderately dilated. Left ventricular diastolic Doppler parameters are indeterminate.  2. Left atrial size was mildly dilated.  3. Right atrial size was mildly dilated.  4. The mitral valve is grossly normal. Mitral valve regurgitation is moderate by color flow Doppler.  5. The tricuspid valve is grossly normal. Tricuspid valve regurgitation is moderate.  6. The aortic valve has an indeterminate number of cusps. Moderate thickening of the aortic valve. Mild  calcification of the aortic valve. Aortic valve regurgitation was not assessed by color flow Doppler. No stenosis of the aortic valve.  LHC 05/14/19 1.  Significant underlying heavily calcified three-vessel coronary artery disease with patent grafts including SVG to RCA, SVG to diagonal (which fills the LAD) and SVG to OM.  LIMA to LAD is known to be atretic but the native LAD does not have obstructive disease.  Overall, no significant change from most recent catheterization. 2.  Severely reduced LV systolic function with an EF of 20 to 25%.  Normal LV end-diastolic pressure.  Epic records reviewed.   Assessment and Plan: 1. Persistent atrial fibrillation/atrial flutter Patient has failed amiodarone. Patient wants to pursue dofetilide, aware of risk vrs benefit. Aware of price of dofetilide CHA2DS2VASc score is 5 Patient will continue on anticoagulation, states no missed doses No benadryl use PharmD has screened drugs. Patient on alfuzosin which can prolong QT, will need close monitoring. Patient stopped mexiletine 3 days ago. QTc in SR 484 ms with RBBB. QTc 489 today. Labs today show creatinine at 1.11, K+ 4.3 and mag 2.0, CrCl calculated at 43 mL/mi    2. Ischemic CM/Chronic systolic CHF S/p CABG. Recent LHC showed no change. No anginal symptoms. Continue present therapy and risk factor modification. EF 25-30%. Continue Toprol.  3. History of sudden cardiac death S/p ICD, upgraded 04/26/2017. Followed by Dr Graciela Husbands and device clinic.   To be admitted later today  when bed is available    Jorja Loa PA-C Afib Clinic [facility] [address] [place], [place] [address] [phone]

## 2019-07-05 NOTE — Progress Notes (Signed)
Pharmacy Review for Dofetilide (Tikosyn) Initiation  Admit Complaint: 85 y.o. male admitted 07/05/2019 with atrial fibrillation to be initiated on dofetilide.   Assessment:  Patient Exclusion Criteria: If any screening criteria checked as "Yes", then  patient  should NOT receive dofetilide until criteria item is corrected. If "Yes" please indicate correction plan.  YES  NO Patient  Exclusion Criteria Correction Plan  []  [x]  Baseline QTc interval is greater than or equal to 440 msec. IF above YES box checked dofetilide contraindicated unless patient has ICD; then may proceed if QTc 500-550 msec or with known ventricular conduction abnormalities may proceed with QTc 550-600 msec. QTc =  489 Patient noted with ICD  []  [x]  Magnesium level is less than 1.8 mEq/l : Last magnesium:  Lab Results  Component Value Date   MG 2.0 07/05/2019         []  [x]  Potassium level is less than 4 mEq/l : Last potassium:  Lab Results  Component Value Date   K 4.3 07/05/2019         []  [x]  Patient is known or suspected to have a digoxin level greater than 2 ng/ml: No results found for: DIGOXIN    []  [x]  Creatinine clearance less than 20 ml/min (calculated using Cockcroft-Gault, actual body weight and serum creatinine): Estimated Creatinine Clearance: 43.4 mL/min (by C-G formula based on SCr of 1.11 mg/dL).    []  []  Patient has received drugs known to prolong the QT intervals within the last 48 hours (phenothiazines, tricyclics or tetracyclic antidepressants, erythromycin, H-1 antihistamines, cisapride, fluoroquinolones, azithromycin). Drugs not listed above may have an, as yet, undetected potential to prolong the QT interval, updated information on QT prolonging agents is available at this website:QT prolonging agents Alfuzosin- conditional risk. Monitor closely  []  [x]  Patient received a dose of hydrochlorothiazide (Oretic) alone or in any combination including triamterene (Dyazide, Maxzide) in the last 48  hours.   []  [x]  Patient received a medication known to increase dofetilide plasma concentrations prior to initial dofetilide dose:  . Trimethoprim (Primsol, Proloprim) in the last 36 hours . Verapamil (Calan, Verelan) in the last 36 hours or a sustained release dose in the last 72 hours . Megestrol (Megace) in the last 5 days  . Cimetidine (Tagamet) in the last 6 hours . Ketoconazole (Nizoral) in the last 24 hours . Itraconazole (Sporanox) in the last 48 hours  . Prochlorperazine (Compazine) in the last 36 hours    []  [x]  Patient is known to have a history of torsades de pointes; congenital or acquired long QT syndromes.   []  [x]  Patient has received a Class 1 antiarrhythmic with less than 2 half-lives since last dose. (Disopyramide, Quinidine, Procainamide, Lidocaine, Mexiletine, Flecainide, Propafenone) stopped mexiletine 3 days ago  []  [x]  Patient has received amiodarone therapy in the past 3 months or amiodarone level is greater than 0.3 ng/ml.    Patient has been appropriately anticoagulated with apixaban.  Ordering provider was confirmed at [email or web address] if they are not listed on the [employer] Prescribers list.  Goal of Therapy: Follow renal function, electrolytes, potential drug interactions, and dose adjustment. Provide education and 1 week supply at discharge.  Plan:  [x]   Physician selected initial dose within range recommended for patients level of renal function - will monitor for response.  []   Physician selected initial dose outside of range recommended for patients level of renal function - will discuss if the dose should be altered at this time.   Select One Calculated CrCl  Dose q12h  []  > 60 ml/min 500 mcg  [x]  40-60 ml/min 250 mcg  []  20-40 ml/min 125 mcg   2. Follow up QTc after the first 5 doses, renal function, electrolytes (K & Mg) daily x 3     days, dose adjustment, success of initiation and facilitate 1 week discharge supply as     clinically  indicated.    Hildred Laser, PharmD Clinical Pharmacist **Pharmacist phone directory can now be found on [email or web address] ([email or web address]).  Listed under [place].

## 2019-07-05 NOTE — H&P (Signed)
ssee note from Afib clinic  Pt seen and examined  Afib PVCs Cardiomyopathy  ICD RBBB   Admitted for dofetilide possibly ranolazine anticpated DCCV to control PVCs and restore sinus  Reviewed plan with him and called his wife

## 2019-07-05 NOTE — Discharge Instructions (Signed)

## 2019-07-05 NOTE — Progress Notes (Signed)
Pt QTC 521 post Tikosyn.  Pt has had several runs NSVT.  RBBB present. K 4.3.  Mg 2.0.  Pt has ICD.  Pt asymptomatic with ectopy.  Dr. Jeannette Corpus notified via [facility].  Will cont to monitor pt closely.

## 2019-07-06 ENCOUNTER — Ambulatory Visit (INDEPENDENT_AMBULATORY_CARE_PROVIDER_SITE_OTHER): Payer: Medicare Other | Admitting: *Deleted

## 2019-07-06 DIAGNOSIS — I255 Ischemic cardiomyopathy: Secondary | ICD-10-CM | POA: Diagnosis not present

## 2019-07-06 DIAGNOSIS — I469 Cardiac arrest, cause unspecified: Secondary | ICD-10-CM

## 2019-07-06 DIAGNOSIS — I428 Other cardiomyopathies: Secondary | ICD-10-CM

## 2019-07-06 LAB — BASIC METABOLIC PANEL
Anion gap: 8 (ref 5–15)
BUN: 15 mg/dL (ref 8–23)
CO2: 26 mmol/L (ref 22–32)
Calcium: 10.7 mg/dL — ABNORMAL HIGH (ref 8.9–10.3)
Chloride: 100 mmol/L (ref 98–111)
Creatinine, Ser: 1.06 mg/dL (ref 0.61–1.24)
GFR calc Af Amer: >60 mL/min (ref >60)
GFR calc non Af Amer: >60 mL/min (ref >60)
Glucose, Bld: 91 mg/dL (ref 70–99)
Potassium: 4.5 mmol/L (ref 3.5–5.1)
Sodium: 134 mmol/L — ABNORMAL LOW (ref 135–145)

## 2019-07-06 LAB — MAGNESIUM: Magnesium: 1.8 mg/dL (ref 1.7–2.4)

## 2019-07-06 MED ORDER — SODIUM CHLORIDE 0.9% FLUSH
3.0000 mL | Freq: Two times a day (BID) | INTRAVENOUS | Status: DC
  Administered 2019-07-07: 3 mL via INTRAVENOUS

## 2019-07-06 MED ORDER — DOFETILIDE 250 MCG PO CAPS
250.0000 ug | ORAL_CAPSULE | Freq: Two times a day (BID) | ORAL | Status: DC
  Administered 2019-07-06 – 2019-07-08 (×4): 250 ug via ORAL
  Filled 2019-07-06 (×4): qty 1

## 2019-07-06 MED ORDER — SODIUM CHLORIDE 0.9 % IV SOLN: 250.0000 mL | INTRAVENOUS | Status: DC

## 2019-07-06 MED ORDER — MAGNESIUM SULFATE 2 GM/50ML IV SOLN
2.0000 g | Freq: Once | INTRAVENOUS | Status: AC
  Administered 2019-07-06: 2 g via INTRAVENOUS
  Filled 2019-07-06: qty 50

## 2019-07-06 MED ORDER — SODIUM CHLORIDE 0.9% FLUSH: 3.0000 mL | INTRAVENOUS | Status: DC | PRN

## 2019-07-06 MED ORDER — HYDROCORTISONE 1 % EX CREA
1.0000 "application " | TOPICAL_CREAM | Freq: Three times a day (TID) | CUTANEOUS | Status: DC | PRN
  Filled 2019-07-06: qty 28

## 2019-07-06 NOTE — TOC Initial Note (Signed)
Transition of Care [facility]) - Initial/Assessment Note    Patient Details  Name: [name] Christensen MRN: [id number] Date of Birth: 1932-08-31  Transition of Care [facility]) CM/SW Contact:    Gala Lewandowsky, RN Phone Number: 07/06/2019, 3:17 PM  Clinical Narrative:  Pt presented for Tikosyn Load. Benefits check in process and CM will make patient aware of cost once completed. PTA from home with spouse. Patient will need a Rx for the 7 day supply no refills sent to [facility]. Pt will need additional Rx for 30 day supply with refills sent to [facility]. CM will continue to monitor for additional transition of care needs.   Expected Discharge Plan: Home/Self Care Barriers to Discharge: No Barriers Identified   Patient Goals and CMS Choice Patient states their goals for this hospitalization and ongoing recovery are:: 'to return home"   Choice offered to / list presented to : NA  Expected Discharge Plan and Services Expected Discharge Plan: Home/Self Care In-house Referral: NA   Post Acute Care Choice: NA Living arrangements for the past 2 months: Single Family Home                  Prior Living Arrangements/Services Living arrangements for the past 2 months: Single Family Home Lives with:: Spouse Patient language and need for interpreter reviewed:: Yes Do you feel safe going back to the place where you live?: Yes      Need for Family Participation in Patient Care: No (Comment) Care giver support system in place?: No (comment)   Criminal Activity/Legal Involvement Pertinent to Current Situation/Hospitalization: No - Comment as needed  Activities of Daily Living Home Assistive Devices/Equipment: None ADL Screening (condition at time of admission) Patient's cognitive ability adequate to safely complete daily activities?: Yes Is the patient deaf or have difficulty hearing?: No Does the patient have difficulty seeing, even when wearing glasses/contacts?: No Does the  patient have difficulty concentrating, remembering, or making decisions?: No Patient able to express need for assistance with ADLs?: Yes Does the patient have difficulty dressing or bathing?: No Independently performs ADLs?: Yes (appropriate for developmental age) Does the patient have difficulty walking or climbing stairs?: No Weakness of Legs: None Weakness of Arms/Hands: None  Permission Sought/Granted Permission sought to share information with : Family Supports                Emotional Assessment Appearance:: Appears stated age Attitude/Demeanor/Rapport: Engaged Affect (typically observed): Accepting Orientation: : Oriented to Situation, Oriented to  Time, Oriented to Place, Oriented to Self Alcohol / Substance Use: Not Applicable Psych Involvement: No (comment)  Admission diagnosis:  a fib Patient Active Problem List   Diagnosis Date Noted  . Persistent atrial fibrillation 07/05/2019  . Ischemic cardiomyopathy   . Unstable angina (HCC)   . Upper respiratory infection 09/23/2018  . Hypocalciuric hypercalcemia 09/16/2018  . Fall 08/17/2018  . Facial pain 08/17/2018  . Hematuria 05/20/2018  . Paroxysmal atrial fibrillation (HCC)   . Bronchiectasis (HCC) assoc with possible MAI 03/11/2018  . Dyspnea on exertion 03/10/2018  . Chronic diastolic CHF (congestive heart failure) (HCC) 03/09/2018  . Atrial flutter (HCC) 03/09/2018  . PVC's (premature ventricular contractions) 03/09/2018  . Arthralgia 01/29/2018  . Dry skin 12/22/2017  . Carotid stenosis, asymptomatic, bilateral 12/22/2017  . Chills with fever 11/24/2017  . Dysuria 11/24/2017  . Anxiety 11/24/2017  . Hepatic cirrhosis (HCC) 06/05/2017  . Chronic calculous cholecystitis 06/05/2017  . Weight loss 04/24/2017  . Sinus node dysfunction (HCC) 04/07/2017  .  Chronic cholecystitis s/p lap cholecystectomy 06/05/2017 02/24/2017  . Pleural effusion 02/24/2017  . Dyslipidemia 08/05/2016  . Fatigue 04/09/2016  .  Constipation 08/09/2015  . Cough in adult 11/30/2014  . LBP (low back pain) 08/30/2013  . Chest pain, atypical 07/21/2013  . Lung infiltrate 07/19/2013  . Ventricular vs Supraventricular bigeminy 03/31/2013  . Implantable cardioverter-defibrillator (ICD) in situ November 22, 2012  . Sudden cardiac death (HCC) November 10, 2012  . CORONARY ARTERY BYPASS GRAFT, HX OF 10/11/2008  . Insomnia 12/07/2007  . Essential hypertension 09/01/2007  . CAD (coronary artery disease) 09/01/2007  . GERD 09/01/2007  . BPH (benign prostatic hyperplasia) 09/01/2007   PCP:  Tresa Garter, MD Pharmacy:   [facility] - [place], [place] - [address] [place] [address] Phone: [phone] Fax: [phone]     Social Determinants of Health (SDOH) Interventions    Readmission Risk Interventions No flowsheet data found.

## 2019-07-06 NOTE — Progress Notes (Signed)
Post dose EKG is reviewed with Dr. [name].  Given wide RBBB, QT/QTc remains acceptable to proceed with Tikosyn load. Tele reviewed  Tommye Standard, PA-C

## 2019-07-06 NOTE — Progress Notes (Addendum)
Progress Note  Patient Name: [name] Christensen Date of Encounter: 07/06/2019  Primary Cardiologist: Jenkins Rouge, MD   Subjective   No complaints, tolerating drug  Inpatient Medications    Scheduled Meds: . acetaminophen  500 mg Oral QHS  . alfuzosin  10 mg Oral QPM  . apixaban  5 mg Oral BID  . atorvastatin  20 mg Oral Daily  . cholecalciferol  400 Units Oral Daily  . dofetilide  250 mcg Oral BID  . dutasteride  0.5 mg Oral QPM  . loratadine  10 mg Oral Daily  . metoprolol succinate  25 mg Oral Daily  . multivitamin with minerals  1 tablet Oral QODAY  . pantoprazole  40 mg Oral Daily  . sodium chloride flush  3 mL Intravenous Q12H  . vitamin B-12  1,000 mcg Oral Daily   Continuous Infusions: . sodium chloride    . magnesium sulfate bolus IVPB     PRN Meds: sodium chloride, linaclotide, nitroGLYCERIN, polyvinyl alcohol, sodium chloride flush   Vital Signs    Vitals:   07/05/19 1259 07/05/19 2056 07/06/19 0508  BP: (!) 158/81 (!) 166/76 124/66  Pulse: 63 (!) 42 (!) 125  Resp: 17  18  Temp: 97.6 F (36.4 C) 98.1 F (36.7 C) (!) 97.5 F (36.4 C)  TempSrc: Oral Oral Oral  SpO2: 98% 95% 95%  Weight: 61.9 kg  58.9 kg  Height: 5\' 7"  (1.702 m)      Intake/Output Summary (Last 24 hours) at 07/06/2019 0732 Last data filed at 07/05/2019 2106 Gross per 24 hour  Intake 243 ml  Output -  Net 243 ml   Last 3 Weights 07/06/2019 07/05/2019 07/05/2019  Weight (lbs) 129 lb 14.4 oz 136 lb 7.4 oz 136 lb 6.4 oz  Weight (kg) 58.922 kg 61.9 kg 61.871 kg      Telemetry    AFib/flutter rates generally 80's, periods of faster rates, felt to be faster conduction flutter, PVCs at baseline- Personally Reviewed at length with Dr. [name]  ECG    AFib 82bpm, RBBB, PVCs, measured QT 400-43ms, QTc 468-538ms, QRS 175ms - Personally Reviewed with Dr. [name]  Physical Exam   GEN: No acute distress.   Neck: No JVD Cardiac: irreg-irreg, no murmurs, rubs, or gallops.  Respiratory: CTA  b/l. GI: Soft, nontender, non-distended  MS: No edema; No deformity. Neuro:  Nonfocal  Psych: Normal affect   Labs    High Sensitivity Troponin:  No results for input(s): TROPONINIHS in the last 720 hours.    Cardiac EnzymesNo results for input(s): TROPONINI in the last 168 hours. No results for input(s): TROPIPOC in the last 168 hours.   Chemistry Recent Labs  Lab 07/05/19 1203 07/06/19 0404  NA 133* 134*  K 4.3 4.5  CL 98 100  CO2 27 26  GLUCOSE 122* 91  BUN 16 15  CREATININE 1.11 1.06  CALCIUM 10.8* 10.7*  GFRNONAA >60 >60  GFRAA >60 >60  ANIONGAP 8 8     HematologyNo results for input(s): WBC, RBC, HGB, HCT, MCV, MCH, MCHC, RDW, PLT in the last 168 hours.  BNPNo results for input(s): BNP, PROBNP in the last 168 hours.   DDimer No results for input(s): DDIMER in the last 168 hours.   Radiology    No results found.  Cardiac Studies    05/14/2019: LHC  There is severe left ventricular systolic dysfunction.  LV end diastolic pressure is normal.  Prox RCA to Mid RCA lesion is 100% stenosed.  Dist Cx lesion is 60% stenosed.  Mid LAD lesion is 60% stenosed.  Prox LAD lesion is 40% stenosed.  1st Mrg lesion is 60% stenosed.  SVG.  The graft exhibits mild .  SVG.  The graft exhibits minimal luminal irregularities.  SVG.  The graft exhibits mild .   1.  Significant underlying heavily calcified three-vessel coronary artery disease with patent grafts including SVG to RCA, SVG to diagonal (which fills the LAD) and SVG to OM.  LIMA to LAD is known to be atretic but the native LAD does not have obstructive disease.  Overall, no significant change from most recent catheterization. 2.  Severely reduced LV systolic function with an EF of 20 to 25%.  Normal LV end-diastolic pressure.  Recommendations: Continue medical therapy for coronary artery disease and chronic systolic heart failure.  Follow-up with Dr. [name] as planned.   04/22/2019: TTE  IMPRESSIONS 1. The left ventricle has severely reduced systolic function, with an ejection fraction of 25-30%. The cavity size was moderately dilated. Left ventricular diastolic Doppler parameters are indeterminate.  2. Left atrial size was mildly dilated.  3. Right atrial size was mildly dilated.  4. The mitral valve is grossly normal. Mitral valve regurgitation is moderate by color flow Doppler.  5. The tricuspid valve is grossly normal. Tricuspid valve regurgitation is moderate.  6. The aortic valve has an indeterminate number of cusps. Moderate thickening of the aortic valve. Mild calcification of the aortic valve. Aortic valve regurgitation was not assessed by color flow Doppler. No stenosis of the aortic valve.  FINDINGS  Left Ventricle: The left ventricle has severely reduced systolic function, with an ejection fraction of 25-30%. The cavity size was moderately dilated. There is no increase in left ventricular wall thickness. Left ventricular diastolic Doppler  parameters are indeterminate.  Right Ventricle: The right ventricle has mildly reduced systolic function. The cavity was normal. There is no increase in right ventricular wall thickness.  Left Atrium: Left atrial size was mildly dilated.  Right Atrium: Right atrial size was mildly dilated. Right atrial pressure is estimated at 10 mmHg.  Interatrial Septum: No atrial level shunt detected by color flow Doppler.  Pericardium: There is no evidence of pericardial effusion.  Mitral Valve: The mitral valve is grossly normal. Mitral valve regurgitation is moderate by color flow Doppler.  Tricuspid Valve: The tricuspid valve is grossly normal. Tricuspid valve regurgitation is moderate by color flow Doppler.  Aortic Valve: The aortic valve has an indeterminate number of cusps Moderate thickening of the aortic valve. Mild calcification of the aortic valve. Aortic valve regurgitation was not assessed by color flow Doppler. There  is No stenosis of the aortic  valve, with a calculated valve area of 2.44 cm.  Pulmonic Valve: The pulmonic valve was grossly normal. Pulmonic valve regurgitation was not assessed by color flow Doppler.  Venous: The inferior vena cava measures 1.90 cm, is normal in size with greater than 50% respiratory variability.  Patient Profile     85 y.o. male w/PMHx of cardiac arrest in 2013 w/ICD, CAD (CABG), HTN, chronic CHF (systolic), HLD, RBBB, PVCs, and persistent AFib/flutter, admitted for tikosyn load  Assessment & Plan    1.  Persistent AFib/flutter      CHA2DS2Vasc is 5, on Eliquis.  Dosed at 5mg , follow weight is bordeline      Tikosyn load in progress       EKG is reviewed noted above, given RBBB/QRS and ICD in place, is OK to proceed  K+ 4.5       Mag 1.8 (replaced)       Creat 1.06, stable        DCCV tomorrow if not in SR, pt aware and agreeable  2. CAD      No anginal complaints     Continue home meds, on statin, BB, no ASA given [place]  3. HTN     No changes today  4. Chronic CHF     Appears compensated currently     On BB/ARB        For questions or updates, please contact [place] Please consult [email or web address] for contact info under        Signed, Baldwin Jamaica, PA-C  07/06/2019, 7:32 AM   Fib has converted to flutter/tachycardia  on dofetilide>> giving rise to faster HR  For DCCV in am Much of the ectopy looks like aberration If still signicant PVC load tomorrow, prob easiest to check after DCCV would add ranolazine Euvolemic today

## 2019-07-07 ENCOUNTER — Encounter (HOSPITAL_COMMUNITY)
Admission: RE | Disposition: A | Discharge status: Home / Self Care | Payer: Self-pay | Source: Ambulatory Visit | Attending: Internal Medicine

## 2019-07-07 ENCOUNTER — Inpatient Hospital Stay (HOSPITAL_COMMUNITY): Payer: Medicare Other | Admitting: Anesthesiology

## 2019-07-07 ENCOUNTER — Encounter (HOSPITAL_COMMUNITY): Payer: Self-pay | Admitting: *Deleted

## 2019-07-07 DIAGNOSIS — I4819 Other persistent atrial fibrillation: Principal | ICD-10-CM

## 2019-07-07 HISTORY — PX: CARDIOVERSION: SHX1299

## 2019-07-07 LAB — BASIC METABOLIC PANEL
Anion gap: 9 (ref 5–15)
BUN: 13 mg/dL (ref 8–23)
CO2: 23 mmol/L (ref 22–32)
Calcium: 10.5 mg/dL — ABNORMAL HIGH (ref 8.9–10.3)
Chloride: 100 mmol/L (ref 98–111)
Creatinine, Ser: 0.97 mg/dL (ref 0.61–1.24)
GFR calc Af Amer: >60 mL/min (ref >60)
GFR calc non Af Amer: >60 mL/min (ref >60)
Glucose, Bld: 92 mg/dL (ref 70–99)
Potassium: 4.1 mmol/L (ref 3.5–5.1)
Sodium: 132 mmol/L — ABNORMAL LOW (ref 135–145)

## 2019-07-07 LAB — MAGNESIUM: Magnesium: 1.8 mg/dL (ref 1.7–2.4)

## 2019-07-07 SURGERY — CARDIOVERSION
Anesthesia: General

## 2019-07-07 MED ORDER — LIDOCAINE HCL (CARDIAC) PF 100 MG/5ML IV SOSY
PREFILLED_SYRINGE | INTRAVENOUS | Status: DC | PRN
  Administered 2019-07-07: 40 mg via INTRATRACHEAL

## 2019-07-07 MED ORDER — PROPOFOL 10 MG/ML IV BOLUS
INTRAVENOUS | Status: DC | PRN
  Administered 2019-07-07: 40 mg via INTRAVENOUS
  Administered 2019-07-07: 10 mg via INTRAVENOUS

## 2019-07-07 MED ORDER — SODIUM CHLORIDE 0.9 % IV SOLN
INTRAVENOUS | Status: DC
  Administered 2019-07-07: 11:00:00 via INTRAVENOUS

## 2019-07-07 MED ORDER — SODIUM CHLORIDE 0.9 % IV SOLN
INTRAVENOUS | Status: DC | PRN
  Administered 2019-07-07: 11:00:00 via INTRAVENOUS

## 2019-07-07 MED ORDER — MAGNESIUM SULFATE 2 GM/50ML IV SOLN
2.0000 g | Freq: Once | INTRAVENOUS | Status: AC
  Administered 2019-07-07: 2 g via INTRAVENOUS
  Filled 2019-07-07: qty 50

## 2019-07-07 NOTE — Progress Notes (Addendum)
Progress Note  Patient Name: [name] Christensen Date of Encounter: 07/07/2019  Primary Cardiologist: Jenkins Rouge, MD   Subjective   No complaints, tolerating drug  Inpatient Medications    Scheduled Meds: . acetaminophen  500 mg Oral QHS  . alfuzosin  10 mg Oral QPM  . apixaban  5 mg Oral BID  . atorvastatin  20 mg Oral Daily  . cholecalciferol  400 Units Oral Daily  . dofetilide  250 mcg Oral BID  . dutasteride  0.5 mg Oral QPM  . loratadine  10 mg Oral Daily  . metoprolol succinate  25 mg Oral Daily  . multivitamin with minerals  1 tablet Oral QODAY  . pantoprazole  40 mg Oral Daily  . sodium chloride flush  3 mL Intravenous Q12H  . sodium chloride flush  3 mL Intravenous Q12H  . vitamin B-12  1,000 mcg Oral Daily   Continuous Infusions: . sodium chloride    . sodium chloride    . magnesium sulfate bolus IVPB     PRN Meds: sodium chloride, hydrocortisone cream, linaclotide, nitroGLYCERIN, polyvinyl alcohol, sodium chloride flush, sodium chloride flush   Vital Signs    Vitals:   07/06/19 1500 07/06/19 2100 07/07/19 0616 07/07/19 0617  BP: 125/68 (!) 158/109 137/78 137/78  Pulse: 70 (!) 105 (!) 56 (!) 56  Resp: 18 18    Temp:  97.7 F (36.5 C) (!) 97.5 F (36.4 C)   TempSrc:  Oral Oral   SpO2: 95% 98% 97%   Weight:      Height:        Intake/Output Summary (Last 24 hours) at 07/07/2019 0817 Last data filed at 07/07/2019 0748 Gross per 24 hour  Intake 960 ml  Output -  Net 960 ml   Last 3 Weights 07/06/2019 07/05/2019 07/05/2019  Weight (lbs) 129 lb 14.4 oz 136 lb 7.4 oz 136 lb 6.4 oz  Weight (kg) 58.922 kg 61.9 kg 61.871 kg      Telemetry    AFib/flutter rates generally 80's, c/w periods of faster rates, again felt to be faster conduction flutter, PVCs noted at baseline- Personally Reviewed with Dr. Curt Bears  ECG    AFlutter 113bpm, RBBB, PVCs, measured QT 445ms, QTc 470ms, QRS 116ms - Personally Reviewed with Dr. Curt Bears  Physical Exam   GEN: No  acute distress.   Neck: No JVD Cardiac: irreg-irreg, no murmurs, rubs, or gallops.  Respiratory: CTA b/l. GI: Soft, nontender, non-distended  MS: No edema; No deformity. Neuro:  Nonfocal  Psych: Normal affect   Labs    High Sensitivity Troponin:  No results for input(s): TROPONINIHS in the last 720 hours.    Cardiac EnzymesNo results for input(s): TROPONINI in the last 168 hours. No results for input(s): TROPIPOC in the last 168 hours.   Chemistry Recent Labs  Lab 07/05/19 1203 07/06/19 0404 07/07/19 0501  NA 133* 134* 132*  K 4.3 4.5 4.1  CL 98 100 100  CO2 27 26 23   GLUCOSE 122* 91 92  BUN 16 15 13   CREATININE 1.11 1.06 0.97  CALCIUM 10.8* 10.7* 10.5*  GFRNONAA >60 >60 >60  GFRAA >60 >60 >60  ANIONGAP 8 8 9      HematologyNo results for input(s): WBC, RBC, HGB, HCT, MCV, MCH, MCHC, RDW, PLT in the last 168 hours.  BNPNo results for input(s): BNP, PROBNP in the last 168 hours.   DDimer No results for input(s): DDIMER in the last 168 hours.   Radiology  No results found.  Cardiac Studies    05/14/2019: LHC  There is severe left ventricular systolic dysfunction.  LV end diastolic pressure is normal.  Prox RCA to Mid RCA lesion is 100% stenosed.  Dist Cx lesion is 60% stenosed.  Mid LAD lesion is 60% stenosed.  Prox LAD lesion is 40% stenosed.  1st Mrg lesion is 60% stenosed.  SVG.  The graft exhibits mild .  SVG.  The graft exhibits minimal luminal irregularities.  SVG.  The graft exhibits mild .   1.  Significant underlying heavily calcified three-vessel coronary artery disease with patent grafts including SVG to RCA, SVG to diagonal (which fills the LAD) and SVG to OM.  LIMA to LAD is known to be atretic but the native LAD does not have obstructive disease.  Overall, no significant change from most recent catheterization. 2.  Severely reduced LV systolic function with an EF of 20 to 25%.  Normal LV end-diastolic pressure.   Recommendations: Continue medical therapy for coronary artery disease and chronic systolic heart failure.  Follow-up with Dr. [name] as planned.   04/22/2019: TTE IMPRESSIONS 1. The left ventricle has severely reduced systolic function, with an ejection fraction of 25-30%. The cavity size was moderately dilated. Left ventricular diastolic Doppler parameters are indeterminate.  2. Left atrial size was mildly dilated.  3. Right atrial size was mildly dilated.  4. The mitral valve is grossly normal. Mitral valve regurgitation is moderate by color flow Doppler.  5. The tricuspid valve is grossly normal. Tricuspid valve regurgitation is moderate.  6. The aortic valve has an indeterminate number of cusps. Moderate thickening of the aortic valve. Mild calcification of the aortic valve. Aortic valve regurgitation was not assessed by color flow Doppler. No stenosis of the aortic valve.  FINDINGS  Left Ventricle: The left ventricle has severely reduced systolic function, with an ejection fraction of 25-30%. The cavity size was moderately dilated. There is no increase in left ventricular wall thickness. Left ventricular diastolic Doppler  parameters are indeterminate.  Right Ventricle: The right ventricle has mildly reduced systolic function. The cavity was normal. There is no increase in right ventricular wall thickness.  Left Atrium: Left atrial size was mildly dilated.  Right Atrium: Right atrial size was mildly dilated. Right atrial pressure is estimated at 10 mmHg.  Interatrial Septum: No atrial level shunt detected by color flow Doppler.  Pericardium: There is no evidence of pericardial effusion.  Mitral Valve: The mitral valve is grossly normal. Mitral valve regurgitation is moderate by color flow Doppler.  Tricuspid Valve: The tricuspid valve is grossly normal. Tricuspid valve regurgitation is moderate by color flow Doppler.  Aortic Valve: The aortic valve has an indeterminate  number of cusps Moderate thickening of the aortic valve. Mild calcification of the aortic valve. Aortic valve regurgitation was not assessed by color flow Doppler. There is No stenosis of the aortic  valve, with a calculated valve area of 2.44 cm.  Pulmonic Valve: The pulmonic valve was grossly normal. Pulmonic valve regurgitation was not assessed by color flow Doppler.  Venous: The inferior vena cava measures 1.90 cm, is normal in size with greater than 50% respiratory variability.  Patient Profile     85 y.o. male w/PMHx of cardiac arrest in 2013 w/ICD, CAD (CABG), HTN, chronic CHF (systolic), HLD, RBBB, PVCs, and persistent AFib/flutter, admitted for tikosyn load  Assessment & Plan    1.  Persistent AFib/flutter      CHA2DS2Vasc is 5, on Eliquis.  Dosed  at 5mg , follow weight is bordeline      Tikosyn load in progress       EKG is reviewed noted above, given RBBB/QRS and ICD in place, is OK to proceed       K+ 4.1       Mag 1.8 (replacement ordered)       Creat 0.97, stable        DCCV today Anticipate discharge tomorrow  2. CAD      No anginal complaints     Continue home meds, on statin, BB, no ASA given [place]  3. HTN     No changes today  4. Chronic CHF     Appears compensated currently     On BB/ARB        For questions or updates, please contact [place] Please consult [email or web address] for contact info under        Signed, Baldwin Jamaica, PA-C  07/07/2019, 8:17 AM    I have seen and examined this patient with Tommye Standard.  Agree with above, note added to reflect my findings.  On exam, iRRR, no murmurs, lungs clear.  Admitted for Tikosyn loading.  Remains in atrial flutter today.  We Kayti Poss plan for cardioversion.  Zoie Sarin M. Tarek Cravens MD 07/07/2019 2:40 PM

## 2019-07-07 NOTE — Anesthesia Postprocedure Evaluation (Signed)
Anesthesia Post Note  [name]  Procedure(s) Performed: CARDIOVERSION (N/A )     Patient location during evaluation: PACU Anesthesia Type: General Level of consciousness: awake and alert and oriented Pain management: pain level controlled Vital Signs Assessment: post-procedure vital signs reviewed and stable Respiratory status: spontaneous breathing, nonlabored ventilation and respiratory function stable Cardiovascular status: blood pressure returned to baseline Postop Assessment: no apparent nausea or vomiting Anesthetic complications: no    Last Vitals:  Vitals:   07/07/19 1045 07/07/19 1129  BP: (!) 189/81 (!) 179/96  Pulse: 89 83  Resp: 17 14  Temp: (!) 36.4 C 36.6 C  SpO2: 98% 98%    Last Pain:  Vitals:   07/07/19 1129  TempSrc: Oral  PainSc: Lane

## 2019-07-07 NOTE — Progress Notes (Signed)
Post EKG-(Tikosyn)- QTc 606- pt asymptomatic will monitor

## 2019-07-07 NOTE — Anesthesia Preprocedure Evaluation (Addendum)
Anesthesia Evaluation  Patient identified by MRN, date of birth, ID band Patient awake    Reviewed: Allergy & Precautions, NPO status , Patient's Chart, lab work & pertinent test results, reviewed documented beta blocker date and time   History of Anesthesia Complications Negative for: history of anesthetic complications  Airway Mallampati: II  TM Distance: >3 FB Neck ROM: Full    Dental no notable dental hx.    Pulmonary neg pulmonary ROS,    Pulmonary exam normal        Cardiovascular hypertension, Pt. on home beta blockers and Pt. on medications + CAD, + Past MI, + CABG (2005) and +CHF  Normal cardiovascular exam+ dysrhythmias Atrial Fibrillation + Cardiac Defibrillator   TTE 03/2019:  EF 25-30%, mild LAE, mild RAE, moderate MR, moderate TR   Neuro/Psych Anxiety negative neurological ROS     GI/Hepatic Neg liver ROS, GERD  Controlled,  Endo/Other  negative endocrine ROS  Renal/GU negative Renal ROS     Musculoskeletal negative musculoskeletal ROS (+)   Abdominal   Peds  Hematology negative hematology ROS (+)   Anesthesia Other Findings Day of surgery medications reviewed with the patient.  Reproductive/Obstetrics                            Anesthesia Physical Anesthesia Plan  ASA: III  Anesthesia Plan: General   Post-op Pain Management:    Induction: Intravenous  PONV Risk Score and Plan: Treatment may vary due to age or medical condition and Propofol infusion  Airway Management Planned: Mask  Additional Equipment: None  Intra-op Plan:   Post-operative Plan:   Informed Consent: I have reviewed the patients History and Physical, chart, labs and discussed the procedure including the risks, benefits and alternatives for the proposed anesthesia with the patient or authorized representative who has indicated his/her understanding and acceptance.       Plan Discussed with:  CRNA  Anesthesia Plan Comments:        Anesthesia Quick Evaluation

## 2019-07-07 NOTE — Transfer of Care (Signed)
Immediate Anesthesia Transfer of Care Note  Patient: [name]  Procedure(s) Performed: CARDIOVERSION (N/A )  Patient Location: Endoscopy Unit  Anesthesia Type:General  Level of Consciousness: awake, alert  and oriented  Airway & Oxygen Therapy: Patient Spontanous Breathing, Patient connected to nasal cannula oxygen and aerosol face mask  Post-op Assessment: Report given to RN, Post -op Vital signs reviewed and stable and Patient moving all extremities X 4  Post vital signs: Reviewed and stable  Last Vitals:  Vitals Value Taken Time  BP    Temp    Pulse 81 07/07/19 1131  Resp 13 07/07/19 1131  SpO2 97 % 07/07/19 1131  Vitals shown include unvalidated device data.  Last Pain:  Vitals:   07/07/19 1045  TempSrc: Temporal  PainSc: 0-No pain      Patients Stated Pain Goal: 0 (44/51/46 0479)  Complications: No apparent anesthesia complications

## 2019-07-07 NOTE — CV Procedure (Signed)
[place]: Anesthesia:  Propofol On RX Eliquis no missed doses  DCC x 1 150J converted from afib rate 114 to  P synch pacing at rate 72 bpm  No immediate neurologic sequelae Continue Tikosyn  Jenkins Rouge

## 2019-07-07 NOTE — Progress Notes (Signed)
Post DCCV EKG is reviewed with  Dr. Curt Bears Measured QT 440-420ms, given wide RBBB, his QTc remains acceptable to continue tikosyn load Pt has an ICD  Tommye Standard, PA-C

## 2019-07-08 LAB — CUP PACEART REMOTE DEVICE CHECK
Battery Remaining Longevity: 67 mo
Battery Remaining Percentage: 67 %
Battery Voltage: 2.95 V
Brady Statistic AP VP Percent: 0 %
Brady Statistic AP VS Percent: 0 %
Brady Statistic AS VP Percent: 4 %
Brady Statistic AS VS Percent: 94 %
Brady Statistic RA Percent Paced: 0 %
Brady Statistic RV Percent Paced: 7.1 %
Date Time Interrogation Session: 20200812153529
HighPow Impedance: 73 Ohm
HighPow Impedance: 73 Ohm
Implantable Lead Implant Date: 20131218
Implantable Lead Implant Date: 20180514
Implantable Lead Location: 753859
Implantable Lead Location: 753860
Implantable Lead Model: 5076
Implantable Pulse Generator Implant Date: 20180514
Lead Channel Impedance Value: 410 Ohm
Lead Channel Impedance Value: 480 Ohm
Lead Channel Pacing Threshold Amplitude: 0.75 V
Lead Channel Pacing Threshold Amplitude: 1.5 V
Lead Channel Pacing Threshold Pulse Width: 0.5 ms
Lead Channel Pacing Threshold Pulse Width: 0.5 ms
Lead Channel Sensing Intrinsic Amplitude: 5 mV
Lead Channel Sensing Intrinsic Amplitude: 6.3 mV
Lead Channel Setting Pacing Amplitude: 2 V
Lead Channel Setting Pacing Amplitude: 2.5 V
Lead Channel Setting Pacing Pulse Width: 0.5 ms
Lead Channel Setting Sensing Sensitivity: 0.5 mV
Pulse Gen Serial Number: 1245762

## 2019-07-08 LAB — BASIC METABOLIC PANEL
Anion gap: 7 (ref 5–15)
BUN: 15 mg/dL (ref 8–23)
CO2: 25 mmol/L (ref 22–32)
Calcium: 10.5 mg/dL — ABNORMAL HIGH (ref 8.9–10.3)
Chloride: 99 mmol/L (ref 98–111)
Creatinine, Ser: 0.97 mg/dL (ref 0.61–1.24)
GFR calc Af Amer: >60 mL/min (ref >60)
GFR calc non Af Amer: >60 mL/min (ref >60)
Glucose, Bld: 103 mg/dL — ABNORMAL HIGH (ref 70–99)
Potassium: 4 mmol/L (ref 3.5–5.1)
Sodium: 131 mmol/L — ABNORMAL LOW (ref 135–145)

## 2019-07-08 LAB — MAGNESIUM: Magnesium: 1.9 mg/dL (ref 1.7–2.4)

## 2019-07-08 MED ORDER — DOFETILIDE 250 MCG PO CAPS: 250.0000 ug | ORAL_CAPSULE | Freq: Two times a day (BID) | ORAL | 0 refills | Status: DC

## 2019-07-08 MED ORDER — MAGNESIUM SULFATE IN D5W 1-5 GM/100ML-% IV SOLN
1.0000 g | Freq: Once | INTRAVENOUS | Status: AC
  Administered 2019-07-08: 1 g via INTRAVENOUS
  Filled 2019-07-08 (×2): qty 100

## 2019-07-08 MED ORDER — DOFETILIDE 250 MCG PO CAPS: 250.0000 ug | ORAL_CAPSULE | Freq: Two times a day (BID) | ORAL | 6 refills | Status: DC

## 2019-07-08 MED FILL — TIKOSYN 250 MCG CAPS: 250 | 7 days supply | Qty: 14 | Fill #0

## 2019-07-08 NOTE — Care Management Important Message (Signed)
Important Message  Patient Details  Name: [name] Christensen MRN: [id number] Date of Birth: 1931/12/02   Medicare Important Message Given:  Yes     Shelda Altes 07/08/2019, 12:44 PM

## 2019-07-08 NOTE — Discharge Summary (Addendum)
ELECTROPHYSIOLOGY PROCEDURE DISCHARGE SUMMARY    Patient ID: [name] Christensen,  MRN: [id number], DOB/AGE: 26-Aug-1932 85 y.o.  Admit date: 07/05/2019 Discharge date: 07/08/2019  Primary Care Physician: Cassandria Anger, MD  Primary Cardiologist: Dr. Johnsie Cancel Electrophysiologist: Dr. [name]  Primary Discharge Diagnosis:  1. Persistent atrial fibrillation status post Tikosyn loading this admission     CHA2DS2Vasc is 5, on Eliquis, appropriately dosed  Secondary Discharge Diagnosis:  1. CAD 2. HTN 3. Chronic CHF  (systolic)     Compensated currently  Allergies  Allergen Reactions  . Ezetimibe Other (See Comments)    Pt was in coma for 6 days, numbness  . Penicillins Other (See Comments)    BLISTERS BETWEEN FINGERS Did it involve swelling of the face/tongue/throat, SOB, or low BP? No Did it involve sudden or severe rash/hives, skin peeling, or any reaction on the inside of your mouth or nose? Yes Did you need to seek medical attention at a hospital or doctor's office? No When did it last happen?60 years If all above answers are "NO", may proceed with cephalosporin use.   . Pravastatin Sodium Other (See Comments)    Aches and pains in joints  . Rosuvastatin Other (See Comments)    MYALGIAS  . Niacin Anxiety and Other (See Comments)    "Makes me feel nervous, jittery"     Procedures This Admission:  1.  Tikosyn loading 2.  Direct current cardioversion on 07/07/2019 by Dr Johnsie Cancel which successfully restored SR.  There were no early apparent complications.   Brief HPI: [name] Christensen is a 85 y.o. male with a past medical history as noted above.  He is followed ny EP. Dr. [name] in the outpatient setting. Options of atrial fibrillation treatment were discussed.  Risks, benefits, and alternatives to Tikosyn were reviewed with the patient who wished to proceed.    Hospital Course:  The patient was admitted and Tikosyn was initiated.  Renal function and electrolytes were  followed during the hospitalization.  His QTc remained stable.  On 07/07/2019 they underwent direct current cardioversion which restored sinus rhythm.  He was monitored until discharge on telemetry which demonstrated SR.  He has known frequent PVCs at baseline.  There was some discussion on starting Ranexa for these, though deferred to outpatient follow up for this.  On the day of discharge,he was examined by Dr Curt Bears who considered them stable for discharge to home.  Follow-up has been arranged with the AFib clinic in 1 week and with Dr [name] in 4 weeks.   Physical Exam: Vitals:   07/07/19 1200 07/07/19 1234 07/07/19 2127 07/08/19 0611  BP: (!) 152/92 (!) 158/95 (!) 144/79 (!) 148/88  Pulse: (!) 27 90 79 78  Resp: 14 12 18    Temp:  97.9 F (36.6 C) 99 F (37.2 C) 97.8 F (36.6 C)  TempSrc:  Oral Oral Oral  SpO2: 95% 99% 97% 96%  Weight:    59.4 kg  Height:        GEN- The patient is well appearing, alert and oriented x 3 today.   HEENT: normocephalic, atraumatic; sclera clear, conjunctiva pink; hearing intact; neck supple, no JVP Lymph- no cervical lymphadenopathy Lungs- CTA b/l, normal work of breathing.  No wheezes, rales, rhonchi Heart- RRR, extra systoles noted, no murmurs, rubs or gallops, PMI not laterally displaced GI- soft, non-tender, non-distended, Extremities- no clubbing, cyanosis, or edema MS- no significant deformity or atrophy Skin- warm and dry, no rash or lesion Psych- euthymic  mood, full affect Neuro- strength and sensation are intact   Labs:   Lab Results  Component Value Date   WBC 4.9 05/24/2019   HGB 12.5 (L) 05/24/2019   HCT 38.0 (L) 05/24/2019   MCV 92.0 05/24/2019   PLT 87.0 (L) 05/24/2019    Recent Labs  Lab 07/08/19 0600  NA 131*  K 4.0  CL 99  CO2 25  BUN 15  CREATININE 0.97  CALCIUM 10.5*  GLUCOSE 103*     Discharge Medications:  Allergies as of 07/08/2019      Reactions   Ezetimibe Other (See Comments)   Pt was in coma for 6  days, numbness   Penicillins Other (See Comments)   BLISTERS BETWEEN FINGERS Did it involve swelling of the face/tongue/throat, SOB, or low BP? No Did it involve sudden or severe rash/hives, skin peeling, or any reaction on the inside of your mouth or nose? Yes Did you need to seek medical attention at a hospital or doctor's office? No When did it last happen?60 years If all above answers are "NO", may proceed with cephalosporin use.   Pravastatin Sodium Other (See Comments)   Aches and pains in joints   Rosuvastatin Other (See Comments)   MYALGIAS   Niacin Anxiety, Other (See Comments)   "Makes me feel nervous, jittery"      Medication List    TAKE these medications   acetaminophen 500 MG tablet Commonly known as: TYLENOL Take 500 mg by mouth at bedtime.   alfuzosin 10 MG 24 hr tablet Commonly known as: UROXATRAL Take 10 mg by mouth every evening.   apixaban 5 MG Tabs tablet Commonly known as: Eliquis Take 1 tablet (5 mg total) by mouth 2 (two) times daily.   atorvastatin 20 MG tablet Commonly known as: LIPITOR TAKE 1 TABLET BY MOUTH ONCE DAILY AT 6PM What changed: See the new instructions.   cholecalciferol 10 MCG (400 UNIT) Tabs tablet Commonly known as: VITAMIN D3 Take 1 tablet (400 Units total) by mouth daily.   dofetilide 250 MCG capsule Commonly known as: TIKOSYN Take 1 capsule (250 mcg total) by mouth 2 (two) times daily.   dutasteride 0.5 MG capsule Commonly known as: AVODART Take 0.5 mg by mouth every evening.   esomeprazole 20 MG capsule Commonly known as: NexIUM Take 1 capsule (20 mg total) by mouth daily at 12 noon.   Linzess 290 MCG Caps capsule Generic drug: linaclotide TAKE 1 CAPSULE BY MOUTH DAILY AS NEEDED What changed:   how much to take  reasons to take this   loratadine 10 MG tablet Commonly known as: CLARITIN Take 1 tablet (10 mg total) by mouth daily.   metoprolol succinate 25 MG 24 hr tablet Commonly known as: TOPROL-XL  TAKE 1 TABLET BY MOUTH ONCE DAILY   multivitamin with minerals Tabs tablet Take 1 tablet by mouth every other day.   nitroGLYCERIN 0.4 MG SL tablet Commonly known as: NITROSTAT DISSOLVE 1 TABLET UNDER TONGUE AS NEEDEDFOR CHEST PAIN. MAY REPEAT 5 MINUTES APART 3 TIMES IF NEEDED What changed: See the new instructions.   SYSTANE OP Place 1 drop into both eyes daily as needed (burning eyes).   vitamin B-12 1000 MCG tablet Commonly known as: CYANOCOBALAMIN Take 1,000 mcg by mouth daily.       Disposition:  Discharge Instructions    Diet - low sodium heart healthy   Complete by: As directed    Increase activity slowly   Complete by: As directed  Follow-up Information    [facility] Follow up.   Specialty: Cardiology Why: 07/15/2019 @ 1:30PM Contact information: [address] [phone] [place]       Deboraha Sprang, MD Follow up.   Specialty: Cardiology Why: 08/19/2019 @ 10:20AM Contact information: [place] [address] [phone]           Duration of Discharge Encounter: Greater than 30 minutes including physician time.  SignedTommye Standard, PA-C 07/08/2019 11:46 AM  I have seen and examined this patient with Tommye Standard.  Agree with above, note added to reflect my findings.  On exam, RRR, no murmurs, lungs clear. Admit for tikosyn load. cardioversion to sinus rhythm. Discharge today.    Violet Cart M. Anna Beaird MD 07/09/2019 7:14 AM

## 2019-07-09 ENCOUNTER — Encounter (HOSPITAL_COMMUNITY): Payer: Self-pay | Admitting: Cardiovascular Disease

## 2019-07-09 ENCOUNTER — Telehealth: Payer: Self-pay

## 2019-07-09 NOTE — Telephone Encounter (Signed)
Pt was admitted on 07/05/2019 and dc'ed on 07/08/2019. Pt is to follow up with cardiology.

## 2019-07-15 ENCOUNTER — Other Ambulatory Visit (HOSPITAL_COMMUNITY): Payer: Self-pay | Admitting: *Deleted

## 2019-07-15 ENCOUNTER — Other Ambulatory Visit: Payer: Self-pay

## 2019-07-15 ENCOUNTER — Ambulatory Visit (HOSPITAL_COMMUNITY)
Admission: RE | Admit: 2019-07-15 | Discharge: 2019-07-15 | Disposition: A | Discharge status: Home / Self Care | Payer: Medicare Other | Source: Ambulatory Visit | Attending: Nurse Practitioner | Admitting: Nurse Practitioner

## 2019-07-15 VITALS — BP 150/80 | HR 82 | Ht 67.0 in | Wt 137.0 lb

## 2019-07-15 DIAGNOSIS — I4819 Other persistent atrial fibrillation: Secondary | ICD-10-CM | POA: Diagnosis not present

## 2019-07-15 DIAGNOSIS — Z7901 Long term (current) use of anticoagulants: Secondary | ICD-10-CM | POA: Insufficient documentation

## 2019-07-15 DIAGNOSIS — K219 Gastro-esophageal reflux disease without esophagitis: Secondary | ICD-10-CM | POA: Diagnosis not present

## 2019-07-15 DIAGNOSIS — I5022 Chronic systolic (congestive) heart failure: Secondary | ICD-10-CM | POA: Diagnosis not present

## 2019-07-15 DIAGNOSIS — E785 Hyperlipidemia, unspecified: Secondary | ICD-10-CM | POA: Insufficient documentation

## 2019-07-15 DIAGNOSIS — I110 Hypertensive heart disease with heart failure: Secondary | ICD-10-CM | POA: Diagnosis not present

## 2019-07-15 DIAGNOSIS — I255 Ischemic cardiomyopathy: Secondary | ICD-10-CM | POA: Diagnosis not present

## 2019-07-15 DIAGNOSIS — Z951 Presence of aortocoronary bypass graft: Secondary | ICD-10-CM | POA: Insufficient documentation

## 2019-07-15 DIAGNOSIS — I4892 Unspecified atrial flutter: Secondary | ICD-10-CM | POA: Insufficient documentation

## 2019-07-15 DIAGNOSIS — Z79899 Other long term (current) drug therapy: Secondary | ICD-10-CM | POA: Diagnosis not present

## 2019-07-15 DIAGNOSIS — I252 Old myocardial infarction: Secondary | ICD-10-CM | POA: Diagnosis not present

## 2019-07-15 DIAGNOSIS — Z8674 Personal history of sudden cardiac arrest: Secondary | ICD-10-CM | POA: Diagnosis not present

## 2019-07-15 DIAGNOSIS — I4510 Unspecified right bundle-branch block: Secondary | ICD-10-CM | POA: Insufficient documentation

## 2019-07-15 LAB — BASIC METABOLIC PANEL
Anion gap: 7 (ref 5–15)
BUN: 17 mg/dL (ref 8–23)
CO2: 27 mmol/L (ref 22–32)
Calcium: 11.1 mg/dL — ABNORMAL HIGH (ref 8.9–10.3)
Chloride: 100 mmol/L (ref 98–111)
Creatinine, Ser: 1.03 mg/dL (ref 0.61–1.24)
GFR calc Af Amer: >60 mL/min (ref >60)
GFR calc non Af Amer: >60 mL/min (ref >60)
Glucose, Bld: 118 mg/dL — ABNORMAL HIGH (ref 70–99)
Potassium: 4.2 mmol/L (ref 3.5–5.1)
Sodium: 134 mmol/L — ABNORMAL LOW (ref 135–145)

## 2019-07-15 LAB — MAGNESIUM: Magnesium: 1.8 mg/dL (ref 1.7–2.4)

## 2019-07-15 MED ORDER — MAGNESIUM OXIDE 400 MG PO TABS: 400.0000 mg | ORAL_TABLET | Freq: Every day | ORAL | 6 refills | Status: DC

## 2019-07-15 NOTE — Progress Notes (Signed)
Remote ICD transmission.   

## 2019-07-15 NOTE — Progress Notes (Signed)
Primary Care Physician: Tresa Garter, MD Referring Physician:Device clinic EP: Dr. Graciela Husbands Cardiologist: Dr. Leroy Libman is a 85 y.o. male with a h/o out of hospital cardiac arrest 2013, received St. Jude ICD, CAD, s/p CABG in 2005, PVC's. HTN, CHF that is in the afib clinic for new onset atrial flutter on device report. Pt is asymptomatic and does not feel any palpitations, possibly slightly more fatigued, but overall has not changed much form his baseline. He has been on asa and plavix for years for his h/o CAD. He does not drink alcohol, no tobacco, no excessive caffeine, pt states that he does not snore or stop breathing while sleeping.   Patient is s/p dofetilide loading. He reports that he feels that his stamina has significantly increased since discharge. He is able to walk much faster and for longer distances. He is tolerating the medicine well. No heart racing or palpitations.   Today, he denies symptoms of palpitations, chest pain, orthopnea, PND, lower extremity edema, dizziness, presyncope, syncope, or neurologic sequela. The patient is tolerating medications without difficulties and is otherwise without complaint today.   Past Medical History:  Diagnosis Date  . AICD (automatic cardioverter/defibrillator) present 04/07/2017  . Allergy   . Anxiety   . BPH (benign prostatic hypertrophy)    elevated PSA Dr. Lindell Noe Bx 2010  . Cardiac arrest [facility])    a. out-of-hospital arrest 10/30/2012 - EF 40-45%, patent grafts on cath, received St. Jude AICD.  [name] Carotid disease, bilateral (HCC)    a. 0-39% by doppers.  . Cataract    bil cataracts removed  . CHF (congestive heart failure) (HCC)   . Coronary artery disease    a. s/p MI/CABG 2005. b. s/p cath at time of VF arrest 10/2013 - grafts patent.  . Cough   . Diverticulosis   . Elevated LFTs    a. 10/2012 felt due to cardiac arrest - Hepatitis C Ab reactive from 10/28/2012>>Hep C RNA PCR negative 10/30/2012.   [name]  GERD (gastroesophageal reflux disease)   . Hyperlipidemia   . Hypertension   . Internal hemorrhoids   . Myocardial infarction (HCC)    2005 - CABG x 5 2005  . RBBB   . Tubular adenoma of colon   . Ventricular bigeminy    a. Event monitor 01/2013: NSR with PVCs and occ bigeminy.   Past Surgical History:  Procedure Laterality Date  . APPENDECTOMY    . CARDIAC CATHETERIZATION  2007   with patent graft anatomy atretic left internal mammary  artery to the LAD which is nonobstructive. Will restart study June 08, 2007  . CARDIOVERSION N/A 03/27/2018   Procedure: CARDIOVERSION;  Surgeon: Iran Ouch, MD;  Location: [facility] ORS;  Service: Cardiovascular;  Laterality: N/A;  . CARDIOVERSION N/A 01/04/2019   Procedure: CARDIOVERSION (CATH LAB);  Surgeon: Iran Ouch, MD;  Location: [facility] ORS;  Service: Cardiovascular;  Laterality: N/A;  . CARDIOVERSION N/A 07/07/2019   Procedure: CARDIOVERSION;  Surgeon: Wendall Stade, MD;  Location: [facility] ENDOSCOPY;  Service: Cardiovascular;  Laterality: N/A;  . CATARACT EXTRACTION W/PHACO Right 04/23/2016   Procedure: CATARACT EXTRACTION PHACO AND INTRAOCULAR LENS PLACEMENT (IOC);  Surgeon: [name], MD;  Location: [facility] ORS;  Service: Ophthalmology;  Laterality: Right;  [place] 1.09 AP% 19.3 CDE 13.38 Fluid pack lot # [id number]  . CHOLECYSTECTOMY N/A 06/05/2017   Procedure: LAPAROSCOPIC CHOLECYSTECTOMY WITH INTRAOPERATIVE CHOLANGIOGRAM ERAS PATHWAY POSSIBLE NEEDLE CORE BIOPSY OF LIVER;  Surgeon: [name], MD;  Location: [facility] OR;  Service: General;  Laterality: N/A;  ERAS PATHWAY  . COLONOSCOPY    . CORONARY ARTERY BYPASS GRAFT  2005  . EYE SURGERY    . ICD GENERATOR CHANGEOUT N/A 04/07/2017   Procedure: ICD Generator Changeout;  Surgeon: Duke Salvia, MD;  Location[facility] INVASIVE CV LAB;  Service: Cardiovascular;  Laterality: N/A;  . IMPLANTABLE CARDIOVERTER DEFIBRILLATOR IMPLANT N/A 11/15/2012   STJ single chamber ICD implanted by Dr Graciela Husbands for cardiac  arrest   . INGUINAL HERNIA REPAIR Bilateral 01/17/2015   Procedure: BILATERAL LAPAROSCOPIC INGUINAL HERNIA REPAIR WITH LEFT FEMORAL HERNIA REPAIR;  Surgeon: [name], MD;  Location: [facility] OR;  Service: General;  Laterality: Bilateral;  . INSERTION OF MESH Bilateral 01/17/2015   Procedure: INSERTION OF MESH;  Surgeon: [name], MD;  Location: [facility] OR;  Service: General;  Laterality: Bilateral;  . LAPAROSCOPIC CHOLECYSTECTOMY  06/05/2017  . LEAD INSERTION N/A 04/07/2017   Procedure: RA Lead Insertion;  Surgeon: Duke Salvia, MD;  Location: [facility] INVASIVE CV LAB;  Service: Cardiovascular;  Laterality: N/A;  . LEAD REVISION/REPAIR N/A 04/08/2017   Procedure: Atrial Lead Revision/Repair;  Surgeon: Duke Salvia, MD;  Location: [facility] INVASIVE CV LAB;  Service: Cardiovascular;  Laterality: N/A;  . LEFT HEART CATH AND CORONARY ANGIOGRAPHY Left 05/14/2019   Procedure: LEFT HEART CATH AND CORONARY ANGIOGRAPHY;  Surgeon: Iran Ouch, MD;  Location: [facility] INVASIVE CV LAB;  Service: Cardiovascular;  Laterality: Left;  . LEFT HEART CATHETERIZATION WITH CORONARY/GRAFT ANGIOGRAM N/A 10/25/2012   Procedure: LEFT HEART CATHETERIZATION WITH Isabel Caprice;  Surgeon: [name], MD;  Location: [facility] CATH LAB;  Service: Cardiovascular;  Laterality: N/A;  . LIVER BIOPSY N/A 06/05/2017   Procedure: SINGLE SITE LIVER BIOPSY ERAS PATHWAY;  Surgeon: [name], MD;  Location: [facility] OR;  Service: General;  Laterality: N/A;  ERAS PATHWAY  . LUMBAR FUSION  09/2007    Current Outpatient Medications  Medication Sig Dispense Refill  . acetaminophen (TYLENOL) 500 MG tablet Take 500 mg by mouth at bedtime.    [name] alfuzosin (UROXATRAL) 10 MG 24 hr tablet Take 10 mg by mouth every evening.     [name] apixaban (ELIQUIS) 5 MG TABS tablet Take 1 tablet (5 mg total) by mouth 2 (two) times daily. 180 tablet 3  . atorvastatin (LIPITOR) 20 MG tablet TAKE 1 TABLET BY MOUTH ONCE DAILY AT 6PM (Patient taking differently: Take 20 mg  by mouth daily. ) 90 tablet 1  . cholecalciferol (VITAMIN D3) 10 MCG (400 UNIT) TABS tablet Take 1 tablet (400 Units total) by mouth daily. 30 tablet 11  . dofetilide (TIKOSYN) 250 MCG capsule Take 1 capsule (250 mcg total) by mouth 2 (two) times daily. 60 capsule 6  . dutasteride (AVODART) 0.5 MG capsule Take 0.5 mg by mouth every evening.     [name] esomeprazole (NEXIUM) 20 MG capsule Take 1 capsule (20 mg total) by mouth daily at 12 noon. 30 capsule 3  . LINZESS 290 MCG CAPS capsule TAKE 1 CAPSULE BY MOUTH DAILY AS NEEDED (Patient taking differently: Take 290 mcg by mouth daily as needed (constipation). ) 30 capsule 11  . loratadine (CLARITIN) 10 MG tablet Take 1 tablet (10 mg total) by mouth daily. 30 tablet 11  . metoprolol succinate (TOPROL-XL) 25 MG 24 hr tablet TAKE 1 TABLET BY MOUTH ONCE DAILY (Patient taking differently: Take 25 mg by mouth daily. ) 90 tablet 2  . Multiple Vitamin (MULTIVITAMIN WITH MINERALS) TABS tablet Take 1  tablet by mouth every other day.    . nitroGLYCERIN (NITROSTAT) 0.4 MG SL tablet DISSOLVE 1 TABLET UNDER TONGUE AS NEEDEDFOR CHEST PAIN. MAY REPEAT 5 MINUTES APART 3 TIMES IF NEEDED (Patient taking differently: Place 0.4 mg under the tongue every 5 (five) minutes as needed for chest pain. DISSOLVE 1 TABLET UNDER TONGUE AS NEEDEDFOR CHEST PAIN. MAY REPEAT 5 MINUTES APART 3 TIMES IF NEEDED) 25 tablet 0  . Polyethyl Glycol-Propyl Glycol (SYSTANE OP) Place 1 drop into both eyes daily as needed (burning eyes).     . vitamin B-12 (CYANOCOBALAMIN) 1000 MCG tablet Take 1,000 mcg by mouth daily.     Current Facility-Administered Medications  Medication Dose Route Frequency Provider Last Rate Last Dose  . sodium chloride flush (NS) 0.9 % injection 3 mL  3 mL Intravenous Q12H Duke Salvia, MD        Allergies  Allergen Reactions  . Ezetimibe Other (See Comments)    Pt was in coma for 6 days, numbness  . Penicillins Other (See Comments)    BLISTERS BETWEEN FINGERS Did it  involve swelling of the face/tongue/throat, SOB, or low BP? No Did it involve sudden or severe rash/hives, skin peeling, or any reaction on the inside of your mouth or nose? Yes Did you need to seek medical attention at a hospital or doctor's office? No When did it last happen?60 years If all above answers are "NO", may proceed with cephalosporin use.   . Pravastatin Sodium Other (See Comments)    Aches and pains in joints  . Rosuvastatin Other (See Comments)    MYALGIAS  . Niacin Anxiety and Other (See Comments)    "Makes me feel nervous, jittery"    Social History   Socioeconomic History  . Marital status: Married    Spouse name: Not on file  . Number of children: 5  . Years of education: Not on file  . Highest education level: Not on file  Occupational History  . 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 Financial resource strain: Not hard at all  . Food insecurity    Worry: Never true    Inability: Never true  . Transportation needs    Medical: No    Non-medical: No  Tobacco Use  . Smoking status: Never Smoker  . Smokeless tobacco: Never Used  Substance and Sexual Activity  . Alcohol use: No  . Drug use: No  . Sexual activity: Never  Lifestyle  . Physical activity    Days per week: 6 days    Minutes per session: 50 min  . Stress: Not at all  Relationships  . Social connections    Talks on phone: More than three times a week    Gets together: More than three times a week    Attends religious service: More than 4 times per year    Active member of club or organization: Yes    Attends meetings of clubs or organizations: More than 4 times per year    Relationship status: Married  . Intimate partner violence    Fear of current or ex partner: No    Emotionally abused: No    Physically abused: No    Forced sexual activity: No  Other Topics Concern  . Not on file  Social History Narrative  . Not on file    Family History   Problem Relation Age of Onset  . Coronary artery disease Mother   . Heart  attack Brother   . Stomach cancer Neg Hx   . Colon cancer Neg Hx   . Esophageal cancer Neg Hx   . Pancreatic cancer Neg Hx   . Prostate cancer Neg Hx   . Rectal cancer Neg Hx   . Hypercalcemia Neg Hx     ROS- All systems are reviewed and negative except as per the HPI above  Physical Exam: Vitals:   07/15/19 1329  BP: (!) 150/80  Pulse: 82  Weight: 62.1 kg  Height: 5\' 7"  (1.702 m)   Wt Readings from Last 3 Encounters:  07/15/19 62.1 kg  07/08/19 59.4 kg  07/05/19 61.9 kg    Labs: Lab Results  Component Value Date   NA 131 (L) 07/08/2019   K 4.0 07/08/2019   CL 99 07/08/2019   CO2 25 07/08/2019   GLUCOSE 103 (H) 07/08/2019   BUN 15 07/08/2019   CREATININE 0.97 07/08/2019   CALCIUM 10.5 (H) 07/08/2019   PHOS 2.6 12-03-12   MG 1.9 07/08/2019   Lab Results  Component Value Date   INR 1.1 04/02/2017   Lab Results  Component Value Date   CHOL 118 09/11/2016   HDL 49.60 09/11/2016   LDLCALC 56 09/11/2016   TRIG 62.0 09/11/2016    GEN- The patient is well appearing elederly male, alert and oriented x 3 today.   HEENT-head normocephalic, atraumatic, sclera clear, conjunctiva pink, hearing intact, trachea midline. Lungs- Clear to ausculation bilaterally, normal work of breathing Heart- Regular rate and rhythm, no murmurs, rubs or gallops. Occasional ectopic beat heard. GI- soft, NT, ND, + BS Extremities- no clubbing, cyanosis, or edema MS- no significant deformity or atrophy Skin- no rash or lesion Psych- euthymic mood, full affect Neuro- strength and sensation are intact   EKG- SR HR 82, RBBB, PVCs, 1st degree AV block, PR 290, QRS 180, QTc 542  Echo 04/22/19 1. The left ventricle has severely reduced systolic function, with an ejection fraction of 25-30%. The cavity size was moderately dilated. Left ventricular diastolic Doppler parameters are indeterminate.  2. Left atrial size  was mildly dilated.  3. Right atrial size was mildly dilated.  4. The mitral valve is grossly normal. Mitral valve regurgitation is moderate by color flow Doppler.  5. The tricuspid valve is grossly normal. Tricuspid valve regurgitation is moderate.  6. The aortic valve has an indeterminate number of cusps. Moderate thickening of the aortic valve. Mild calcification of the aortic valve. Aortic valve regurgitation was not assessed by color flow Doppler. No stenosis of the aortic valve.  LHC 05/14/19 1.  Significant underlying heavily calcified three-vessel coronary artery disease with patent grafts including SVG to RCA, SVG to diagonal (which fills the LAD) and SVG to OM.  LIMA to LAD is known to be atretic but the native LAD does not have obstructive disease.  Overall, no significant change from most recent catheterization. 2.  Severely reduced LV systolic function with an EF of 20 to 25%.  Normal LV end-diastolic pressure.  Epic records reviewed.   Assessment and Plan: 1. Persistent atrial fibrillation/atrial flutter Patient has failed amiodarone. S/p dofetilide loading. Patient appears to be maintaining SR. Continue dofetilide 250 mcg BID. QT stable. Continue Eliquis 5 mg BID Continue Toprol 25 mg daily Bmet/mag today  This patients CHA2DS2-VASc Score and unadjusted Ischemic Stroke Rate (% per year) is equal to 7.2 % stroke rate/year from a score of 5  Above score calculated as 1 point each if present [CHF, HTN, DM, Vascular=MI/PAD/Aortic  Plaque, Age if 47-74, or Male] Above score calculated as 2 points each if present [Age > 75, or Stroke/TIA/TE]   2. Ischemic CM/Chronic systolic CHF S/p CABG. Recent LHC showed no change. No anginal symptoms. Continue present therapy and risk factor modification. EF 25-30%. Continue Toprol.  3. History of sudden cardiac death S/p ICD, upgraded Apr 25, 2017. Followed by Dr Graciela Husbands and device clinic.  4. HTN Stable, no changes today.   Follow up  with Dr Graciela Husbands as scheduled.   Jorja Loa PA-C Afib Clinic [facility] [address] [place], [place] [address] [phone]

## 2019-07-26 DIAGNOSIS — R972 Elevated prostate specific antigen [PSA]: Secondary | ICD-10-CM | POA: Diagnosis not present

## 2019-07-26 DIAGNOSIS — N401 Enlarged prostate with lower urinary tract symptoms: Secondary | ICD-10-CM | POA: Diagnosis not present

## 2019-08-03 ENCOUNTER — Ambulatory Visit (INDEPENDENT_AMBULATORY_CARE_PROVIDER_SITE_OTHER): Payer: Medicare Other | Admitting: Internal Medicine

## 2019-08-03 ENCOUNTER — Encounter: Payer: Self-pay | Admitting: Internal Medicine

## 2019-08-03 ENCOUNTER — Other Ambulatory Visit: Payer: Self-pay

## 2019-08-03 VITALS — BP 128/70 | HR 112 | Ht 67.0 in | Wt 138.8 lb

## 2019-08-03 DIAGNOSIS — I4819 Other persistent atrial fibrillation: Secondary | ICD-10-CM

## 2019-08-03 DIAGNOSIS — Z79899 Other long term (current) drug therapy: Secondary | ICD-10-CM | POA: Diagnosis not present

## 2019-08-03 DIAGNOSIS — Z95810 Presence of automatic (implantable) cardiac defibrillator: Secondary | ICD-10-CM | POA: Diagnosis not present

## 2019-08-03 DIAGNOSIS — I480 Paroxysmal atrial fibrillation: Secondary | ICD-10-CM | POA: Diagnosis not present

## 2019-08-03 DIAGNOSIS — I200 Unstable angina: Secondary | ICD-10-CM | POA: Diagnosis not present

## 2019-08-03 DIAGNOSIS — I255 Ischemic cardiomyopathy: Secondary | ICD-10-CM

## 2019-08-03 DIAGNOSIS — I493 Ventricular premature depolarization: Secondary | ICD-10-CM

## 2019-08-03 DIAGNOSIS — I5022 Chronic systolic (congestive) heart failure: Secondary | ICD-10-CM | POA: Diagnosis not present

## 2019-08-03 MED ORDER — METOPROLOL SUCCINATE ER 50 MG PO TB24: 50.0000 mg | ORAL_TABLET | Freq: Every day | ORAL | 3 refills | Status: DC

## 2019-08-03 NOTE — Progress Notes (Signed)
Patient Care Team: Plotnikov, Georgina Quint, MD as PCP - General (Internal Medicine) Wendall Stade, MD as PCP - Cardiology (Cardiology) Duke Salvia, MD as PCP - Electrophysiology (Cardiology) Lindell Noe Lehman Prom., MD (Urology) [name], MD as Consulting Physician (General Surgery) Pyrtle, Carie Caddy, MD as Consulting Physician (Gastroenterology) [name], MD as Referring Physician (Ophthalmology)   HPI  [name] Christensen is a 85 y.o. male Since last being seen in our clinic for ischemic cardiomyopathy, frequent PVCs, worsening LV function and chronic systolic heart failure, persistent atrial fibrillation; the patient reports having lost his appetite in the last month.  This is been concurrent with an ongoing cough.  No  COVID neg Because of CP underwent cath>. As Below  Admitted for dofetilide and DCCV 8/20--seen in afib clinic a few weeks later and feeling much better   Overall, he continues to feel a great deal better.  He is now able to keep up with his wife with a walker.  He has noted some sweats at night as well as a little bit of headache.  These are new on dofetilide.  He continues to have some anorexia.  This antedates the dofetilide.    DATE TEST PVC  1/15 Holter 13%  1/18 Holter 31%     DATE TEST EF   12/13 Cath   Patent Grafts  2/15 Echo   45-50 %   12/17 Echo   50-55 %   4/19 Echo 20-25%   10/19 Echo  25-30%   1/20 Echo  20-25%   6/20 LHC 20-25% LIMA-LADatretic SVG-OM,SVG-D with filling of LAD,SVG-OM patent   Date Cr K Mg Hgb  6/20 1.14 4.9 2.0(3/20) 12.5          Antiarrhythmics Date Reason stopped  amio 7/19    dofetilide          Records and Results Reviewed *  Past Medical History:  Diagnosis Date  . AICD (automatic cardioverter/defibrillator) present 04/07/2017  . Allergy   . Anxiety   . BPH (benign prostatic hypertrophy)    elevated PSA Dr. Lindell Noe Bx 2010  . Cardiac arrest [facility])    a. out-of-hospital arrest  11/22/2012 - EF 40-45%, patent grafts on cath, received St. Jude AICD.  [name] Carotid disease, bilateral (HCC)    a. 0-39% by doppers.  . Cataract    bil cataracts removed  . CHF (congestive heart failure) (HCC)   . Coronary artery disease    a. s/p MI/CABG 2005. b. s/p cath at time of VF arrest 10/2013 - grafts patent.  . Cough   . Diverticulosis   . Elevated LFTs    a. 10/2012 felt due to cardiac arrest - Hepatitis C Ab reactive from 10/29/2012>>Hep C RNA PCR negative 11/18/2012.   [name] GERD (gastroesophageal reflux disease)   . Hyperlipidemia   . Hypertension   . Internal hemorrhoids   . Myocardial infarction (HCC)    2005 - CABG x 5 2005  . RBBB   . Tubular adenoma of colon   . Ventricular bigeminy    a. Event monitor 01/2013: NSR with PVCs and occ bigeminy.    Past Surgical History:  Procedure Laterality Date  . APPENDECTOMY    . CARDIAC CATHETERIZATION  2007   with patent graft anatomy atretic left internal mammary  artery to the LAD which is nonobstructive. Will restart study June 08, 2007  . CARDIOVERSION N/A 03/27/2018   Procedure: CARDIOVERSION;  Surgeon: Iran Ouch,  MD;  Location: [facility] ORS;  Service: Cardiovascular;  Laterality: N/A;  . CARDIOVERSION N/A 01/04/2019   Procedure: CARDIOVERSION (CATH LAB);  Surgeon: Iran Ouch, MD;  Location: [facility] ORS;  Service: Cardiovascular;  Laterality: N/A;  . CARDIOVERSION N/A 07/07/2019   Procedure: CARDIOVERSION;  Surgeon: Wendall Stade, MD;  Location[facility] ENDOSCOPY;  Service: Cardiovascular;  Laterality: N/A;  . CATARACT EXTRACTION W/PHACO Right 04/23/2016   Procedure: CATARACT EXTRACTION PHACO AND INTRAOCULAR LENS PLACEMENT (IOC);  Surgeon: [name], MD;  Location: [facility] ORS;  Service: Ophthalmology;  Laterality: Right;  [place] 1.09 AP% 19.3 CDE 13.38 Fluid pack lot # [id number]  . CHOLECYSTECTOMY N/A 06/05/2017   Procedure: LAPAROSCOPIC CHOLECYSTECTOMY WITH INTRAOPERATIVE CHOLANGIOGRAM ERAS PATHWAY POSSIBLE NEEDLE CORE BIOPSY  OF LIVER;  Surgeon: [name], MD;  Location: [facility] OR;  Service: General;  Laterality: N/A;  ERAS PATHWAY  . COLONOSCOPY    . CORONARY ARTERY BYPASS GRAFT  2005  . EYE SURGERY    . ICD GENERATOR CHANGEOUT N/A 04/07/2017   Procedure: ICD Generator Changeout;  Surgeon: Duke Salvia, MD;  Location: [facility] INVASIVE CV LAB;  Service: Cardiovascular;  Laterality: N/A;  . IMPLANTABLE CARDIOVERTER DEFIBRILLATOR IMPLANT N/A 11/16/2012   STJ single chamber ICD implanted by Dr Graciela Husbands for cardiac arrest   . INGUINAL HERNIA REPAIR Bilateral 01/17/2015   Procedure: BILATERAL LAPAROSCOPIC INGUINAL HERNIA REPAIR WITH LEFT FEMORAL HERNIA REPAIR;  Surgeon: [name], MD;  Location: [facility] OR;  Service: General;  Laterality: Bilateral;  . INSERTION OF MESH Bilateral 01/17/2015   Procedure: INSERTION OF MESH;  Surgeon: [name], MD;  Location: [facility] OR;  Service: General;  Laterality: Bilateral;  . LAPAROSCOPIC CHOLECYSTECTOMY  06/05/2017  . LEAD INSERTION N/A 04/07/2017   Procedure: RA Lead Insertion;  Surgeon: Duke Salvia, MD;  Location: [facility] INVASIVE CV LAB;  Service: Cardiovascular;  Laterality: N/A;  . LEAD REVISION/REPAIR N/A 04/08/2017   Procedure: Atrial Lead Revision/Repair;  Surgeon: Duke Salvia, MD;  Location: [facility] INVASIVE CV LAB;  Service: Cardiovascular;  Laterality: N/A;  . LEFT HEART CATH AND CORONARY ANGIOGRAPHY Left 05/14/2019   Procedure: LEFT HEART CATH AND CORONARY ANGIOGRAPHY;  Surgeon: Iran Ouch, MD;  Location: [facility] INVASIVE CV LAB;  Service: Cardiovascular;  Laterality: Left;  . LEFT HEART CATHETERIZATION WITH CORONARY/GRAFT ANGIOGRAM N/A 11/19/2012   Procedure: LEFT HEART CATHETERIZATION WITH Isabel Caprice;  Surgeon: [name], MD;  Location: [facility] CATH LAB;  Service: Cardiovascular;  Laterality: N/A;  . LIVER BIOPSY N/A 06/05/2017   Procedure: SINGLE SITE LIVER BIOPSY ERAS PATHWAY;  Surgeon: [name], MD;  Location: [facility] OR;  Service: General;  Laterality: N/A;  ERAS  PATHWAY  . LUMBAR FUSION  09/2007    Current Meds  Medication Sig  . acetaminophen (TYLENOL) 500 MG tablet Take 500 mg by mouth at bedtime.  [name] alfuzosin (UROXATRAL) 10 MG 24 hr tablet Take 10 mg by mouth every evening.   [name] apixaban (ELIQUIS) 5 MG TABS tablet Take 1 tablet (5 mg total) by mouth 2 (two) times daily.  [name] atorvastatin (LIPITOR) 20 MG tablet TAKE 1 TABLET BY MOUTH ONCE DAILY AT 6PM (Patient taking differently: Take 20 mg by mouth daily. )  . cholecalciferol (VITAMIN D3) 10 MCG (400 UNIT) TABS tablet Take 1 tablet (400 Units total) by mouth daily.  [name] dofetilide (TIKOSYN) 250 MCG capsule Take 1 capsule (250 mcg total) by mouth 2 (two) times daily.  [name] dutasteride (AVODART) 0.5 MG capsule Take 0.5 mg by mouth every evening.   [name]  esomeprazole (NEXIUM) 20 MG capsule Take 1 capsule (20 mg total) by mouth daily at 12 noon.  [name] LINZESS 290 MCG CAPS capsule TAKE 1 CAPSULE BY MOUTH DAILY AS NEEDED (Patient taking differently: Take 290 mcg by mouth daily as needed (constipation). )  . loratadine (CLARITIN) 10 MG tablet Take 1 tablet (10 mg total) by mouth daily.  . magnesium oxide (MAG-OX) 400 MG tablet Take 1 tablet (400 mg total) by mouth daily.  . metoprolol succinate (TOPROL-XL) 25 MG 24 hr tablet TAKE 1 TABLET BY MOUTH ONCE DAILY (Patient taking differently: Take 25 mg by mouth daily. )  . Multiple Vitamin (MULTIVITAMIN WITH MINERALS) TABS tablet Take 1 tablet by mouth every other day.  . nitroGLYCERIN (NITROSTAT) 0.4 MG SL tablet DISSOLVE 1 TABLET UNDER TONGUE AS NEEDEDFOR CHEST PAIN. MAY REPEAT 5 MINUTES APART 3 TIMES IF NEEDED (Patient taking differently: Place 0.4 mg under the tongue every 5 (five) minutes as needed for chest pain. DISSOLVE 1 TABLET UNDER TONGUE AS NEEDEDFOR CHEST PAIN. MAY REPEAT 5 MINUTES APART 3 TIMES IF NEEDED)  . Polyethyl Glycol-Propyl Glycol (SYSTANE OP) Place 1 drop into both eyes daily as needed (burning eyes).   . vitamin B-12 (CYANOCOBALAMIN) 1000 MCG tablet  Take 1,000 mcg by mouth daily.   Current Facility-Administered Medications for the 08/03/19 encounter (Office Visit) with Duke Salvia, MD  Medication  . sodium chloride flush (NS) 0.9 % injection 3 mL   Current Outpatient Medications on File Prior to Visit  Medication Sig Dispense Refill  . acetaminophen (TYLENOL) 500 MG tablet Take 500 mg by mouth at bedtime.    [name] alfuzosin (UROXATRAL) 10 MG 24 hr tablet Take 10 mg by mouth every evening.     [name] apixaban (ELIQUIS) 5 MG TABS tablet Take 1 tablet (5 mg total) by mouth 2 (two) times daily. 180 tablet 3  . atorvastatin (LIPITOR) 20 MG tablet TAKE 1 TABLET BY MOUTH ONCE DAILY AT 6PM (Patient taking differently: Take 20 mg by mouth daily. ) 90 tablet 1  . cholecalciferol (VITAMIN D3) 10 MCG (400 UNIT) TABS tablet Take 1 tablet (400 Units total) by mouth daily. 30 tablet 11  . dofetilide (TIKOSYN) 250 MCG capsule Take 1 capsule (250 mcg total) by mouth 2 (two) times daily. 60 capsule 6  . dutasteride (AVODART) 0.5 MG capsule Take 0.5 mg by mouth every evening.     [name] esomeprazole (NEXIUM) 20 MG capsule Take 1 capsule (20 mg total) by mouth daily at 12 noon. 30 capsule 3  . LINZESS 290 MCG CAPS capsule TAKE 1 CAPSULE BY MOUTH DAILY AS NEEDED (Patient taking differently: Take 290 mcg by mouth daily as needed (constipation). ) 30 capsule 11  . loratadine (CLARITIN) 10 MG tablet Take 1 tablet (10 mg total) by mouth daily. 30 tablet 11  . magnesium oxide (MAG-OX) 400 MG tablet Take 1 tablet (400 mg total) by mouth daily. 30 tablet 6  . metoprolol succinate (TOPROL-XL) 25 MG 24 hr tablet TAKE 1 TABLET BY MOUTH ONCE DAILY (Patient taking differently: Take 25 mg by mouth daily. ) 90 tablet 2  . Multiple Vitamin (MULTIVITAMIN WITH MINERALS) TABS tablet Take 1 tablet by mouth every other day.    . nitroGLYCERIN (NITROSTAT) 0.4 MG SL tablet DISSOLVE 1 TABLET UNDER TONGUE AS NEEDEDFOR CHEST PAIN. MAY REPEAT 5 MINUTES APART 3 TIMES IF NEEDED (Patient taking  differently: Place 0.4 mg under the tongue every 5 (five) minutes as needed for chest pain. DISSOLVE 1 TABLET  UNDER TONGUE AS NEEDEDFOR CHEST PAIN. MAY REPEAT 5 MINUTES APART 3 TIMES IF NEEDED) 25 tablet 0  . Polyethyl Glycol-Propyl Glycol (SYSTANE OP) Place 1 drop into both eyes daily as needed (burning eyes).     . vitamin B-12 (CYANOCOBALAMIN) 1000 MCG tablet Take 1,000 mcg by mouth daily.     Current Facility-Administered Medications on File Prior to Visit  Medication Dose Route Frequency Provider Last Rate Last Dose  . sodium chloride flush (NS) 0.9 % injection 3 mL  3 mL Intravenous Q12H Duke Salvia, MD         Allergies  Allergen Reactions  . Ezetimibe Other (See Comments)    Pt was in coma for 6 days, numbness  . Penicillins Other (See Comments)    BLISTERS BETWEEN FINGERS Did it involve swelling of the face/tongue/throat, SOB, or low BP? No Did it involve sudden or severe rash/hives, skin peeling, or any reaction on the inside of your mouth or nose? Yes Did you need to seek medical attention at a hospital or doctor's office? No When did it last happen?60 years If all above answers are "NO", may proceed with cephalosporin use.   . Pravastatin Sodium Other (See Comments)    Aches and pains in joints  . Rosuvastatin Other (See Comments)    MYALGIAS  . Niacin Anxiety and Other (See Comments)    "Makes me feel nervous, jittery"      Review of Systems negative except from HPI and PMH  Physical Exam BP 128/70 (BP Location: Left Arm, Patient Position: Sitting, Cuff Size: Normal)   Pulse (!) 112   Ht 5\' 7"  (1.702 m)   Wt 138 lb 12 oz (62.9 kg)   BMI 21.73 kg/m  Well developed and well nourished in no acute distress HENT normal Neck supple with JVP-flat Clear Device pocket well healed; without hematoma or erythema.  There is no tethering  Rapid but Regular rate and rhythm, no   murmur Abd-soft with active BS No Clubbing cyanosis  edema Skin-warm and dry A &  Oriented  Grossly normal sensory and motor function  ECG sinus @ 114 29/17/41       ASSESSMENT & PLAN:    Atrial fibrillation/flutter-persistent  PVCs-right bundle branch superior axis identified by the device with decreased ventricular pacing  Cardiomyopathy recurrent  History of aborted sudden cardiac death    Implantable defibrillator-St. Jude   upgraded single-dual-chamber May 2018  Ischemic cardiomyopathy with prior CABG    Congestive Heart Failure chronic systolic  Right bundle branch block.  Sinus bradycardia/chronotropic incompetence  By description, his rapid rates are coming and going.  Based on his history, I would have suspected his heart rate would have been slower; although, when he was seen in the A. fib clinic a week after cardioversion, his heart rate was 82.  We will continue to monitor him for a couple of weeks as he is feeling overall with good deal better than he has.  Check a CBC as a potential cause for secondary tachycardia.  TSH was normal in June.  Increase his metoprolol from 25--50.  We will do a repeat visit in about 3-4 weeks by telehealth with a prior transmission to look at heart rate and PVC burden.  We spent more than 50% of our >25 min visit in face to face counseling regarding the above  Spoke with wife by phone   Sherryl Manges, MD  08/03/2019 12:10 PM     [facility] [address] [place] [address] [phone] (office) [phone] (fax)

## 2019-08-03 NOTE — Patient Instructions (Addendum)
Medication Instructions:  - Your physician has recommended you make the following change in your medication:   1) Increase toprol (metoprolol succinate) to 50 mg- take 1 tablet by mouth once daily.   If you need a refill on your cardiac medications before your next appointment, please call your pharmacy.   Lab work: - Your physician recommends that you have lab work today: CBC  If you have labs (blood work) drawn today and your tests are completely normal, you will receive your results only by: [name] MyChart Message (if you have MyChart) OR . A paper copy in the mail If you have any lab test that is abnormal or we need to change your treatment, we will call you to review the results.  Testing/Procedures: - none ordered  Follow-Up: At [facility], you and your health needs are our priority.  As part of our continuing mission to provide you with exceptional heart care, we have created designated Provider Care Teams.  These Care Teams include your primary Cardiologist (physician) and Advanced Practice Providers (APPs -  Physician Assistants and Nurse Practitioners) who all work together to provide you with the care you need, when you need it. . in 3 weeks with Dr. [name] ([place]) - Tuesday 08/24/19 at 9:00 am.   Remote monitoring is used to monitor your Pacemaker of ICD from home. This monitoring reduces the number of office visits required to check your device to one time per year. It allows [place] to keep an eye on the functioning of your device to ensure it is working properly. You are scheduled for a device check from home on 08/23/19. You may send your transmission at any time that day. If you have a wireless device, the transmission will be sent automatically. After your physician reviews your transmission, you will receive a postcard with your next transmission date.   Any Other Special Instructions Will Be Listed Below (If Applicable). - N/A

## 2019-08-04 LAB — CBC WITH DIFFERENTIAL/PLATELET
Basophils Absolute: 0 10*3/uL (ref 0.0–0.2)
Basos: 1 %
EOS (ABSOLUTE): 0.2 10*3/uL (ref 0.0–0.4)
Eos: 4 %
Hematocrit: 37.2 % — ABNORMAL LOW (ref 37.5–51.0)
Hemoglobin: 12.8 g/dL — ABNORMAL LOW (ref 13.0–17.7)
Immature Grans (Abs): 0 10*3/uL (ref 0.0–0.1)
Immature Granulocytes: 1 %
Lymphocytes Absolute: 1.3 10*3/uL (ref 0.7–3.1)
Lymphs: 22 %
MCH: 30.5 pg (ref 26.6–33.0)
MCHC: 34.4 g/dL (ref 31.5–35.7)
MCV: 89 fL (ref 79–97)
Monocytes Absolute: 0.5 10*3/uL (ref 0.1–0.9)
Monocytes: 9 %
Neutrophils Absolute: 3.7 10*3/uL (ref 1.4–7.0)
Neutrophils: 63 %
Platelets: 107 10*3/uL — ABNORMAL LOW (ref 150–450)
RBC: 4.2 x10E6/uL (ref 4.14–5.80)
RDW: 12.1 % (ref 11.6–15.4)
WBC: 5.7 10*3/uL (ref 3.4–10.8)

## 2019-08-10 ENCOUNTER — Other Ambulatory Visit: Payer: Self-pay | Admitting: Internal Medicine

## 2019-08-19 ENCOUNTER — Ambulatory Visit: Payer: Medicare Other | Admitting: Internal Medicine

## 2019-08-23 ENCOUNTER — Telehealth: Payer: Self-pay | Admitting: Internal Medicine

## 2019-08-23 ENCOUNTER — Ambulatory Visit (INDEPENDENT_AMBULATORY_CARE_PROVIDER_SITE_OTHER): Payer: Medicare Other | Admitting: *Deleted

## 2019-08-23 DIAGNOSIS — I255 Ischemic cardiomyopathy: Secondary | ICD-10-CM

## 2019-08-23 DIAGNOSIS — I469 Cardiac arrest, cause unspecified: Secondary | ICD-10-CM

## 2019-08-23 LAB — CUP PACEART REMOTE DEVICE CHECK
Battery Remaining Longevity: 65 mo
Battery Remaining Percentage: 66 %
Battery Voltage: 2.95 V
Brady Statistic AP VP Percent: 1 %
Brady Statistic AP VS Percent: 29 %
Brady Statistic AS VP Percent: 2.2 %
Brady Statistic AS VS Percent: 56 %
Brady Statistic RA Percent Paced: 14 %
Brady Statistic RV Percent Paced: 2.8 %
Date Time Interrogation Session: 20200928060018
HighPow Impedance: 69 Ohm
HighPow Impedance: 69 Ohm
Implantable Lead Implant Date: 20131218
Implantable Lead Implant Date: 20180514
Implantable Lead Location: 753859
Implantable Lead Location: 753860
Implantable Lead Model: 5076
Implantable Pulse Generator Implant Date: 20180514
Lead Channel Impedance Value: 400 Ohm
Lead Channel Impedance Value: 480 Ohm
Lead Channel Pacing Threshold Amplitude: 0.5 V
Lead Channel Pacing Threshold Amplitude: 1.25 V
Lead Channel Pacing Threshold Pulse Width: 0.5 ms
Lead Channel Pacing Threshold Pulse Width: 0.5 ms
Lead Channel Sensing Intrinsic Amplitude: 4.8 mV
Lead Channel Sensing Intrinsic Amplitude: 5 mV
Lead Channel Setting Pacing Amplitude: 2 V
Lead Channel Setting Pacing Amplitude: 2.5 V
Lead Channel Setting Pacing Pulse Width: 0.5 ms
Lead Channel Setting Sensing Sensitivity: 0.5 mV
Pulse Gen Serial Number: 1245762

## 2019-08-23 NOTE — Telephone Encounter (Signed)
Confirmed that remote transmission received. Normal device function . Has virtual f/u with Dr [name] 08/24/19 after starting Tikosyn.

## 2019-08-23 NOTE — Telephone Encounter (Signed)
Patient would like to know if his transmission went through this morning, please call and advise.

## 2019-08-24 ENCOUNTER — Telehealth (INDEPENDENT_AMBULATORY_CARE_PROVIDER_SITE_OTHER): Payer: Medicare Other | Admitting: Internal Medicine

## 2019-08-24 ENCOUNTER — Other Ambulatory Visit: Payer: Self-pay

## 2019-08-24 VITALS — BP 125/80 | HR 65 | Ht 67.0 in | Wt 134.4 lb

## 2019-08-24 DIAGNOSIS — Z95810 Presence of automatic (implantable) cardiac defibrillator: Secondary | ICD-10-CM | POA: Diagnosis not present

## 2019-08-24 DIAGNOSIS — Z79899 Other long term (current) drug therapy: Secondary | ICD-10-CM | POA: Diagnosis not present

## 2019-08-24 DIAGNOSIS — I5022 Chronic systolic (congestive) heart failure: Secondary | ICD-10-CM

## 2019-08-24 DIAGNOSIS — I493 Ventricular premature depolarization: Secondary | ICD-10-CM | POA: Diagnosis not present

## 2019-08-24 DIAGNOSIS — I4819 Other persistent atrial fibrillation: Secondary | ICD-10-CM | POA: Diagnosis not present

## 2019-08-24 DIAGNOSIS — I255 Ischemic cardiomyopathy: Secondary | ICD-10-CM | POA: Diagnosis not present

## 2019-08-24 DIAGNOSIS — I200 Unstable angina: Secondary | ICD-10-CM

## 2019-08-24 NOTE — Progress Notes (Signed)
Electrophysiology TeleHealth Note   Due to national recommendations of social distancing due to COVID 19, an audio/video telehealth visit is felt to be most appropriate for this patient at this time.  See MyChart message from today for the patient's consent to telehealth for [facility].   Date:  08/24/2019   ID:  [name] Christensen, DOB [birth year], MRN [id number]  Location: patient's home  Provider location: [address] [place]  Evaluation Performed: Follow-up visit  PCP:  Plotnikov, Georgina Quint, MD  Cardiologist:    * Electrophysiologist:  SK   Chief Complaint:    History of Present Illness:    [name] Christensen is a 85 y.o. male who presents via audio/video conferencing for a telehealth visit today.The patient did not have access to video technology/had technical difficulties with video requiring transitioning to audio format only (telephone).  All issues noted in this document were discussed and addressed.  No physical exam could be performed with this format.    Since last being seen in our clinic for atrial fibrillation and pauses the patient reports doing better in some ways and not in others  More energy but tired in the afternoon  New meds include dofetilide for afib and metoprolol    DATE TEST PVC  1/15 Holter 13%  1/18 Holter 31%  2/20 ZIO 7.2% ( all one morphology)   9/20 Pacer 12%    DATE TEST EF   12/13 Cath  Patent Grafts  2/15 Echo  45-50 %   12/17 Echo  50-55 %   4/19 Echo 20-25%   10/19 Echo  25-30%   1/20 Echo 20-25%   6/20 LHC 20-25% LIMA-LADatretic SVG-OM,SVG-D with filling of LAD,SVG-OM patent        Date Cr K Mg Hgb  6/20 1.14 4.9 2.0(3/20) 12.5  8/20  1.03 4.2  12.8    Antiarrhythmics Date Reason stopped  amio 7/19    dofetilide         Walking with his wife, overall feels better over the last 3-4 months, stamina is worse over the last month    The patient denies symptoms of fevers, chills, cough, or new  SOB worrisome for COVID 19.    Past Medical History:  Diagnosis Date  . AICD (automatic cardioverter/defibrillator) present 04/07/2017  . Allergy   . Anxiety   . BPH (benign prostatic hypertrophy)    elevated PSA Dr. Lindell Noe Bx 2010  . Cardiac arrest [facility])    a. out-of-hospital arrest 11/24/2012 - EF 40-45%, patent grafts on cath, received St. Jude AICD.  [name] Carotid disease, bilateral (HCC)    a. 0-39% by doppers.  . Cataract    bil cataracts removed  . CHF (congestive heart failure) (HCC)   . Coronary artery disease    a. s/p MI/CABG 2005. b. s/p cath at time of VF arrest 10/2013 - grafts patent.  . Cough   . Diverticulosis   . Elevated LFTs    a. 10/2012 felt due to cardiac arrest - Hepatitis C Ab reactive from 11/14/2012>>Hep C RNA PCR negative 10/25/2012.   [name] GERD (gastroesophageal reflux disease)   . Hyperlipidemia   . Hypertension   . Internal hemorrhoids   . Myocardial infarction (HCC)    2005 - CABG x 5 2005  . RBBB   . Tubular adenoma of colon   . Ventricular bigeminy    a. Event monitor 01/2013: NSR with PVCs and occ bigeminy.    Past Surgical  History:  Procedure Laterality Date  . APPENDECTOMY    . CARDIAC CATHETERIZATION  2007   with patent graft anatomy atretic left internal mammary  artery to the LAD which is nonobstructive. Will restart study June 08, 2007  . CARDIOVERSION N/A 03/27/2018   Procedure: CARDIOVERSION;  Surgeon: Iran Ouch, MD;  Location: [facility] ORS;  Service: Cardiovascular;  Laterality: N/A;  . CARDIOVERSION N/A 01/04/2019   Procedure: CARDIOVERSION (CATH LAB);  Surgeon: Iran Ouch, MD;  Location: [facility] ORS;  Service: Cardiovascular;  Laterality: N/A;  . CARDIOVERSION N/A 07/07/2019   Procedure: CARDIOVERSION;  Surgeon: Wendall Stade, MD;  Location: [facility] ENDOSCOPY;  Service: Cardiovascular;  Laterality: N/A;  . CATARACT EXTRACTION W/PHACO Right 04/23/2016   Procedure: CATARACT EXTRACTION PHACO AND INTRAOCULAR LENS PLACEMENT (IOC);  Surgeon:  [name], MD;  Location: [facility] ORS;  Service: Ophthalmology;  Laterality: Right;  [place] 1.09 AP% 19.3 CDE 13.38 Fluid pack lot # [id number]  . CHOLECYSTECTOMY N/A 06/05/2017   Procedure: LAPAROSCOPIC CHOLECYSTECTOMY WITH INTRAOPERATIVE CHOLANGIOGRAM ERAS PATHWAY POSSIBLE NEEDLE CORE BIOPSY OF LIVER;  Surgeon: [name], MD;  Location: [facility] OR;  Service: General;  Laterality: N/A;  ERAS PATHWAY  . COLONOSCOPY    . CORONARY ARTERY BYPASS GRAFT  2005  . EYE SURGERY    . ICD GENERATOR CHANGEOUT N/A 04/07/2017   Procedure: ICD Generator Changeout;  Surgeon: Duke Salvia, MD;  Location: [facility] INVASIVE CV LAB;  Service: Cardiovascular;  Laterality: N/A;  . IMPLANTABLE CARDIOVERTER DEFIBRILLATOR IMPLANT N/A 11/17/2012   STJ single chamber ICD implanted by Dr Graciela Husbands for cardiac arrest   . INGUINAL HERNIA REPAIR Bilateral 01/17/2015   Procedure: BILATERAL LAPAROSCOPIC INGUINAL HERNIA REPAIR WITH LEFT FEMORAL HERNIA REPAIR;  Surgeon: [name], MD;  Location: [facility] OR;  Service: General;  Laterality: Bilateral;  . INSERTION OF MESH Bilateral 01/17/2015   Procedure: INSERTION OF MESH;  Surgeon: [name], MD;  Location: [facility] OR;  Service: General;  Laterality: Bilateral;  . LAPAROSCOPIC CHOLECYSTECTOMY  06/05/2017  . LEAD INSERTION N/A 04/07/2017   Procedure: RA Lead Insertion;  Surgeon: Duke Salvia, MD;  Location: [facility] INVASIVE CV LAB;  Service: Cardiovascular;  Laterality: N/A;  . LEAD REVISION/REPAIR N/A 04/08/2017   Procedure: Atrial Lead Revision/Repair;  Surgeon: Duke Salvia, MD;  Location: [facility] INVASIVE CV LAB;  Service: Cardiovascular;  Laterality: N/A;  . LEFT HEART CATH AND CORONARY ANGIOGRAPHY Left 05/14/2019   Procedure: LEFT HEART CATH AND CORONARY ANGIOGRAPHY;  Surgeon: Iran Ouch, MD;  Location: [facility] INVASIVE CV LAB;  Service: Cardiovascular;  Laterality: Left;  . LEFT HEART CATHETERIZATION WITH CORONARY/GRAFT ANGIOGRAM N/A 11/18/2012   Procedure: LEFT HEART CATHETERIZATION WITH  Isabel Caprice;  Surgeon: [name], MD;  Location: [facility] CATH LAB;  Service: Cardiovascular;  Laterality: N/A;  . LIVER BIOPSY N/A 06/05/2017   Procedure: SINGLE SITE LIVER BIOPSY ERAS PATHWAY;  Surgeon: [name], MD;  Location: [facility] OR;  Service: General;  Laterality: N/A;  ERAS PATHWAY  . LUMBAR FUSION  09/2007    Current Outpatient Medications  Medication Sig Dispense Refill  . acetaminophen (TYLENOL) 500 MG tablet Take 500 mg by mouth at bedtime.    [name] alfuzosin (UROXATRAL) 10 MG 24 hr tablet Take 10 mg by mouth every evening.     [name] apixaban (ELIQUIS) 5 MG TABS tablet Take 1 tablet (5 mg total) by mouth 2 (two) times daily. 180 tablet 3  . atorvastatin (LIPITOR) 20 MG tablet Take 1 tablet (20 mg total) by mouth  daily. 90 tablet 3  . cholecalciferol (VITAMIN D3) 10 MCG (400 UNIT) TABS tablet Take 1 tablet (400 Units total) by mouth daily. 30 tablet 11  . dofetilide (TIKOSYN) 250 MCG capsule Take 1 capsule (250 mcg total) by mouth 2 (two) times daily. 60 capsule 6  . dutasteride (AVODART) 0.5 MG capsule Take 0.5 mg by mouth every evening.     [name] esomeprazole (NEXIUM) 20 MG capsule Take 1 capsule (20 mg total) by mouth daily at 12 noon. 30 capsule 3  . LINZESS 290 MCG CAPS capsule TAKE 1 CAPSULE BY MOUTH DAILY AS NEEDED (Patient taking differently: Take 290 mcg by mouth daily as needed (constipation). ) 30 capsule 11  . loratadine (CLARITIN) 10 MG tablet Take 1 tablet (10 mg total) by mouth daily. 30 tablet 11  . magnesium oxide (MAG-OX) 400 MG tablet Take 1 tablet (400 mg total) by mouth daily. 30 tablet 6  . metoprolol succinate (TOPROL-XL) 50 MG 24 hr tablet Take 1 tablet (50 mg total) by mouth daily. Take with or immediately following a meal. 90 tablet 3  . Multiple Vitamin (MULTIVITAMIN WITH MINERALS) TABS tablet Take 1 tablet by mouth every other day.    . nitroGLYCERIN (NITROSTAT) 0.4 MG SL tablet DISSOLVE 1 TABLET UNDER TONGUE AS NEEDEDFOR CHEST PAIN. MAY REPEAT 5 MINUTES  APART 3 TIMES IF NEEDED (Patient taking differently: Place 0.4 mg under the tongue every 5 (five) minutes as needed for chest pain. DISSOLVE 1 TABLET UNDER TONGUE AS NEEDEDFOR CHEST PAIN. MAY REPEAT 5 MINUTES APART 3 TIMES IF NEEDED) 25 tablet 0  . Polyethyl Glycol-Propyl Glycol (SYSTANE OP) Place 1 drop into both eyes daily as needed (burning eyes).     . vitamin B-12 (CYANOCOBALAMIN) 1000 MCG tablet Take 1,000 mcg by mouth daily.     Current Facility-Administered Medications  Medication Dose Route Frequency Provider Last Rate Last Dose  . sodium chloride flush (NS) 0.9 % injection 3 mL  3 mL Intravenous Q12H Duke Salvia, MD        Allergies:   Ezetimibe, Penicillins, Pravastatin sodium, Rosuvastatin, and Niacin   Social History:  The patient  reports that he has never smoked. He has never used smokeless tobacco. He reports that he does not drink alcohol or use drugs.   Family History:  The patient's   family history includes Coronary artery disease in his mother; Heart attack in his brother.   ROS:  Please see the history of present illness.   All other systems are personally reviewed and negative.    Exam:    Vital Signs:  BP 125/80 (BP Location: Right Arm, Patient Position: Sitting, Cuff Size: Normal)   Pulse 65   Ht 5\' 7"  (1.702 m)   Wt 134 lb 6.4 oz (61 kg)   BMI 21.05 kg/m        Labs/Other Tests and Data Reviewed:    Recent Labs: 05/24/2019: ALT 14; TSH 2.65 07/15/2019: BUN 17; Creatinine, Ser 1.03; Magnesium 1.8; Potassium 4.2; Sodium 134 08/03/2019: Hemoglobin 12.8; Platelets 107   Wt Readings from Last 3 Encounters:  08/24/19 134 lb 6.4 oz (61 kg)  08/03/19 138 lb 12 oz (62.9 kg)  07/15/19 137 lb (62.1 kg)     Other studies personally reviewed: Additional studies/ records that were reviewed today include:  As Below *  Transmission 9/20     ASSESSMENT & PLAN:   Atrial fibrillation/flutter-persistent  PVCs-right bundle branch superior axis identified by  the device with decreased ventricular  pacing  Cardiomyopathy recurrent  History of aborted sudden cardiac death  Implantable defibrillator-St. Jude upgraded single-dual-chamber May 2018  Ischemic cardiomyopathy with prior CABG  Congestive Heart Failure chronic systolic  Right bundle branch block.  Sinus bradycardia/chronotropic incompetence  Holding sinus on dofetilide  But still large PVC burden   Improved but issues of fatigue persist I am dragging feet for referral for PVC ablation in this octogenerian As try to exclude other causes ? Drug effect with metoprolol ? Chronotropic incompetence  Euvolemic by pt assessment   COVID 19 screen The patient denies symptoms of COVID 19 at this time.  The importance of social distancing was discussed today.  Follow-up:  8-10- weeks  OV     Current medicines are reviewed at length with the patient today.   The patient does not have concerns regarding his medicines.  The following changes were made today:   Decrease metoprolol to 25 and take at night Pulse oximeter to measure HR  Echo in about 6 weeks    Labs/ tests ordered today include:   No orders of the defined types were placed in this encounter.   Future tests ( post COVID )    Patient Risk:  after full review of this patients clinical status, I feel that they are at [facility] at this time.  Today, I have spent minutes with the patient with telehealth technology discussing the above.  Signed, Sherryl Manges, MD  08/24/2019 9:39 AM     [facility] [address] [place] [address] [phone] (office) [phone] (fax)

## 2019-08-26 MED ORDER — METOPROLOL SUCCINATE ER 50 MG PO TB24: ORAL_TABLET | ORAL | Status: DC

## 2019-08-26 NOTE — Patient Instructions (Addendum)
Medication Instructions:  - Your physician has recommended you make the following change in your medication:   1) Decrease metoprolol 50 mg- take 1/2 tablet (25 mg) by mouth once daily at bedtime  If you need a refill on your cardiac medications before your next appointment, please call your pharmacy.   Lab work: - none ordered  If you have labs (blood work) drawn today and your tests are completely normal, you will receive your results only by: [name] MyChart Message (if you have MyChart) OR . A paper copy in the mail If you have any lab test that is abnormal or we need to change your treatment, we will call you to review the results.  Testing/Procedures: - Your physician has requested that you have an echocardiogram- in 6 weeks. Echocardiography is a painless test that uses sound waves to create images of your heart. It provides your doctor with information about the size and shape of your heart and how well your heart's chambers and valves are working. This procedure takes approximately one hour. There are no restrictions for this procedure.   Follow-Up: At [facility], you and your health needs are our priority.  As part of our continuing mission to provide you with exceptional heart care, we have created designated Provider Care Teams.  These Care Teams include your primary Cardiologist (physician) and Advanced Practice Providers (APPs -  Physician Assistants and Nurse Practitioners) who all work together to provide you with the care you need, when you need it. . in 8-10 weeks with Dr. [name] (in office visit)  Any Other Special Instructions Will Be Listed Below (If Applicable). - Please obtain a pulse oximeter to track you heart rate

## 2019-09-01 LAB — CUP PACEART INCLINIC DEVICE CHECK
Brady Statistic RA Percent Paced: 1 % — CL
Brady Statistic RV Percent Paced: 6 %
Date Time Interrogation Session: 20201007120028
HighPow Impedance: 71 Ohm
Implantable Lead Implant Date: 20131218
Implantable Lead Implant Date: 20180514
Implantable Lead Location: 753859
Implantable Lead Location: 753860
Implantable Lead Model: 5076
Implantable Pulse Generator Implant Date: 20180514
Lead Channel Impedance Value: 460 Ohm
Lead Channel Impedance Value: 510 Ohm
Lead Channel Pacing Threshold Amplitude: 0.5 V
Lead Channel Pacing Threshold Amplitude: 1.25 V
Lead Channel Pacing Threshold Pulse Width: 0.5 ms
Lead Channel Pacing Threshold Pulse Width: 0.5 ms
Lead Channel Sensing Intrinsic Amplitude: 4.2 mV
Lead Channel Sensing Intrinsic Amplitude: 4.3 mV
Lead Channel Setting Pacing Amplitude: 2 V
Lead Channel Setting Pacing Amplitude: 2.5 V
Lead Channel Setting Pacing Pulse Width: 0.5 ms
Lead Channel Setting Sensing Sensitivity: 0.5 mV
Pulse Gen Serial Number: 1245762

## 2019-09-01 NOTE — Progress Notes (Signed)
Remote ICD transmission.   

## 2019-09-20 ENCOUNTER — Other Ambulatory Visit: Payer: Self-pay | Admitting: Internal Medicine

## 2019-09-20 NOTE — Progress Notes (Signed)
Spent approx 10 m min with patient

## 2019-09-29 ENCOUNTER — Other Ambulatory Visit: Payer: Self-pay

## 2019-09-29 ENCOUNTER — Ambulatory Visit (INDEPENDENT_AMBULATORY_CARE_PROVIDER_SITE_OTHER): Payer: Medicare Other | Admitting: Internal Medicine

## 2019-09-29 ENCOUNTER — Encounter: Payer: Self-pay | Admitting: Internal Medicine

## 2019-09-29 VITALS — BP 126/82 | HR 75 | Temp 98.0°F | Ht 67.0 in | Wt 139.0 lb

## 2019-09-29 DIAGNOSIS — I5032 Chronic diastolic (congestive) heart failure: Secondary | ICD-10-CM | POA: Diagnosis not present

## 2019-09-29 DIAGNOSIS — I480 Paroxysmal atrial fibrillation: Secondary | ICD-10-CM

## 2019-09-29 DIAGNOSIS — Z23 Encounter for immunization: Secondary | ICD-10-CM | POA: Diagnosis not present

## 2019-09-29 DIAGNOSIS — I2510 Atherosclerotic heart disease of native coronary artery without angina pectoris: Secondary | ICD-10-CM

## 2019-09-29 DIAGNOSIS — I200 Unstable angina: Secondary | ICD-10-CM

## 2019-09-29 DIAGNOSIS — R5382 Chronic fatigue, unspecified: Secondary | ICD-10-CM | POA: Diagnosis not present

## 2019-09-29 DIAGNOSIS — E785 Hyperlipidemia, unspecified: Secondary | ICD-10-CM | POA: Diagnosis not present

## 2019-09-29 DIAGNOSIS — R739 Hyperglycemia, unspecified: Secondary | ICD-10-CM

## 2019-09-29 NOTE — Progress Notes (Signed)
Subjective:  Patient ID: [name] Christensen, male    DOB: 1932-09-25  Age: 85 y.o. MRN: [id number]  CC: No chief complaint on file.   HPI [name] Christensen presents for CHF, A fib,CAD f/u The pt had a traumatic dental work recently Last chills spell - months ago    Outpatient Medications Prior to Visit  Medication Sig Dispense Refill  . acetaminophen (TYLENOL) 500 MG tablet Take 500 mg by mouth at bedtime.    [name] alfuzosin (UROXATRAL) 10 MG 24 hr tablet Take 10 mg by mouth every evening.     [name] apixaban (ELIQUIS) 5 MG TABS tablet Take 1 tablet (5 mg total) by mouth 2 (two) times daily. 180 tablet 3  . atorvastatin (LIPITOR) 20 MG tablet Take 1 tablet (20 mg total) by mouth daily. 90 tablet 3  . cholecalciferol (VITAMIN D3) 10 MCG (400 UNIT) TABS tablet Take 1 tablet (400 Units total) by mouth daily. 30 tablet 11  . dofetilide (TIKOSYN) 250 MCG capsule Take 1 capsule (250 mcg total) by mouth 2 (two) times daily. 60 capsule 6  . dutasteride (AVODART) 0.5 MG capsule Take 0.5 mg by mouth every evening.     [name] esomeprazole (NEXIUM) 20 MG capsule Take 1 capsule (20 mg total) by mouth daily at 12 noon. 30 capsule 3  . LINZESS 290 MCG CAPS capsule TAKE 1 CAPSULE BY MOUTH DAILY AS NEEDED (Patient taking differently: Take 290 mcg by mouth daily as needed (constipation). ) 30 capsule 11  . loratadine (CLARITIN) 10 MG tablet Take 1 tablet (10 mg total) by mouth daily. 30 tablet 11  . magnesium oxide (MAG-OX) 400 MG tablet Take 1 tablet (400 mg total) by mouth daily. 30 tablet 6  . metoprolol succinate (TOPROL-XL) 50 MG 24 hr tablet Take 1/2 tablet (25 mg) by mouth once daily at bedtime    . Multiple Vitamin (MULTIVITAMIN WITH MINERALS) TABS tablet Take 1 tablet by mouth every other day.    . nitroGLYCERIN (NITROSTAT) 0.4 MG SL tablet DISSOLVE 1 TABLET UNDER TONGUE AS NEEDEDFOR CHEST PAIN. MAY REPEAT 5 MINUTES APART 3 TIMES IF NEEDED 25 tablet 0  . Polyethyl Glycol-Propyl Glycol (SYSTANE OP) Place 1 drop  into both eyes daily as needed (burning eyes).     . vitamin B-12 (CYANOCOBALAMIN) 1000 MCG tablet Take 1,000 mcg by mouth daily.     Facility-Administered Medications Prior to Visit  Medication Dose Route Frequency Provider Last Rate Last Dose  . sodium chloride flush (NS) 0.9 % injection 3 mL  3 mL Intravenous Q12H Deboraha Sprang, MD        ROS: Review of Systems  Constitutional: Negative for appetite change, fatigue and unexpected weight change.  HENT: Negative for congestion, nosebleeds, sneezing, sore throat and trouble swallowing.   Eyes: Negative for itching and visual disturbance.  Respiratory: Negative for cough.   Cardiovascular: Positive for palpitations. Negative for chest pain and leg swelling.  Gastrointestinal: Negative for abdominal distention, blood in stool, diarrhea and nausea.  Genitourinary: Negative for frequency and hematuria.  Musculoskeletal: Negative for back pain, gait problem, joint swelling and neck pain.  Skin: Negative for rash.  Neurological: Negative for dizziness, tremors, speech difficulty and weakness.  Psychiatric/Behavioral: Negative for agitation, dysphoric mood and sleep disturbance. The patient is not nervous/anxious.     Objective:  BP 126/82 (BP Location: Left Arm, Patient Position: Sitting, Cuff Size: Normal)   Pulse 75   Temp 98 F (36.7 C) (Oral)   Ht 5'  7" (1.702 m)   Wt 139 lb (63 kg)   SpO2 97%   BMI 21.77 kg/m   BP Readings from Last 3 Encounters:  09/29/19 126/82  08/24/19 125/80  08/03/19 128/70    Wt Readings from Last 3 Encounters:  09/29/19 139 lb (63 kg)  08/24/19 134 lb 6.4 oz (61 kg)  08/03/19 138 lb 12 oz (62.9 kg)    Physical Exam Constitutional:      General: He is not in acute distress.    Appearance: He is well-developed.     Comments: NAD  Eyes:     Conjunctiva/sclera: Conjunctivae normal.     Pupils: Pupils are equal, round, and reactive to light.  Neck:     Musculoskeletal: Normal range of  motion.     Thyroid: No thyromegaly.     Vascular: No JVD.  Cardiovascular:     Rate and Rhythm: Normal rate and regular rhythm.     Heart sounds: Normal heart sounds. No murmur. No friction rub. No gallop.   Pulmonary:     Effort: Pulmonary effort is normal. No respiratory distress.     Breath sounds: Normal breath sounds. No wheezing or rales.  Chest:     Chest wall: No tenderness.  Abdominal:     General: Bowel sounds are normal. There is no distension.     Palpations: Abdomen is soft. There is no mass.     Tenderness: There is no abdominal tenderness. There is no guarding or rebound.  Musculoskeletal: Normal range of motion.        General: No tenderness.  Lymphadenopathy:     Cervical: No cervical adenopathy.  Skin:    General: Skin is warm and dry.     Findings: No rash.  Neurological:     Mental Status: He is alert and oriented to person, place, and time.     Cranial Nerves: No cranial nerve deficit.     Motor: No abnormal muscle tone.     Coordination: Coordination normal.     Gait: Gait normal.     Deep Tendon Reflexes: Reflexes are normal and symmetric.  Psychiatric:        Behavior: Behavior normal.        Thought Content: Thought content normal.        Judgment: Judgment normal.     Lab Results  Component Value Date   WBC 5.7 08/03/2019   HGB 12.8 (L) 08/03/2019   HCT 37.2 (L) 08/03/2019   PLT 107 (L) 08/03/2019   GLUCOSE 118 (H) 07/15/2019   CHOL 118 09/11/2016   TRIG 62.0 09/11/2016   HDL 49.60 09/11/2016   LDLCALC 56 09/11/2016   ALT 14 05/24/2019   AST 24 05/24/2019   NA 134 (L) 07/15/2019   K 4.2 07/15/2019   CL 100 07/15/2019   CREATININE 1.03 07/15/2019   BUN 17 07/15/2019   CO2 27 07/15/2019   TSH 2.65 05/24/2019   PSA 12.45 (H) 02/06/2016   INR 1.1 04/02/2017   HGBA1C 6.4 10/10/2017    No results found.  Assessment & Plan:   There are no diagnoses linked to this encounter.   No orders of the defined types were placed in this  encounter.    Follow-up: No follow-ups on file.  Walker Kehr, MD

## 2019-09-29 NOTE — Assessment & Plan Note (Signed)
wts are the same at home

## 2019-09-29 NOTE — Assessment & Plan Note (Signed)
Eliquis, Toprol, Tikosyn F/u w/Dr [name]

## 2019-09-29 NOTE — Assessment & Plan Note (Signed)
Lipitor 

## 2019-09-29 NOTE — Assessment & Plan Note (Signed)
Stable

## 2019-09-29 NOTE — Assessment & Plan Note (Signed)
Atenolol, ASA, Plavix, Lipitor, Losartan °

## 2019-09-29 NOTE — Addendum Note (Signed)
Addended by: Karren Cobble on: 09/29/2019 09:40 AM   Modules accepted: Orders

## 2019-10-05 ENCOUNTER — Other Ambulatory Visit: Payer: Self-pay

## 2019-10-05 ENCOUNTER — Ambulatory Visit (INDEPENDENT_AMBULATORY_CARE_PROVIDER_SITE_OTHER): Payer: Medicare Other

## 2019-10-05 DIAGNOSIS — I493 Ventricular premature depolarization: Secondary | ICD-10-CM | POA: Diagnosis not present

## 2019-10-05 DIAGNOSIS — I4819 Other persistent atrial fibrillation: Secondary | ICD-10-CM | POA: Diagnosis not present

## 2019-10-06 ENCOUNTER — Ambulatory Visit (INDEPENDENT_AMBULATORY_CARE_PROVIDER_SITE_OTHER): Payer: Medicare Other | Admitting: *Deleted

## 2019-10-06 DIAGNOSIS — I4819 Other persistent atrial fibrillation: Secondary | ICD-10-CM | POA: Diagnosis not present

## 2019-10-06 DIAGNOSIS — I255 Ischemic cardiomyopathy: Secondary | ICD-10-CM

## 2019-10-06 LAB — CUP PACEART REMOTE DEVICE CHECK
Battery Remaining Longevity: 64 mo
Battery Remaining Percentage: 65 %
Battery Voltage: 2.96 V
Brady Statistic AP VP Percent: 1 %
Brady Statistic AP VS Percent: 19 %
Brady Statistic AS VP Percent: 1.9 %
Brady Statistic AS VS Percent: 68 %
Brady Statistic RA Percent Paced: 7.8 %
Brady Statistic RV Percent Paced: 2.2 %
Date Time Interrogation Session: 20201111065017
HighPow Impedance: 69 Ohm
HighPow Impedance: 69 Ohm
Implantable Lead Implant Date: 20131218
Implantable Lead Implant Date: 20180514
Implantable Lead Location: 753859
Implantable Lead Location: 753860
Implantable Lead Model: 5076
Implantable Pulse Generator Implant Date: 20180514
Lead Channel Impedance Value: 410 Ohm
Lead Channel Impedance Value: 480 Ohm
Lead Channel Pacing Threshold Amplitude: 0.5 V
Lead Channel Pacing Threshold Amplitude: 1.25 V
Lead Channel Pacing Threshold Pulse Width: 0.5 ms
Lead Channel Pacing Threshold Pulse Width: 0.5 ms
Lead Channel Sensing Intrinsic Amplitude: 4.1 mV
Lead Channel Sensing Intrinsic Amplitude: 5.2 mV
Lead Channel Setting Pacing Amplitude: 2 V
Lead Channel Setting Pacing Amplitude: 2.5 V
Lead Channel Setting Pacing Pulse Width: 0.5 ms
Lead Channel Setting Sensing Sensitivity: 0.5 mV
Pulse Gen Serial Number: 1245762

## 2019-10-19 ENCOUNTER — Encounter: Payer: Self-pay | Admitting: Internal Medicine

## 2019-10-19 ENCOUNTER — Other Ambulatory Visit: Payer: Self-pay | Admitting: Internal Medicine

## 2019-10-19 ENCOUNTER — Other Ambulatory Visit: Payer: Self-pay

## 2019-10-19 ENCOUNTER — Ambulatory Visit (INDEPENDENT_AMBULATORY_CARE_PROVIDER_SITE_OTHER): Payer: Medicare Other | Admitting: Internal Medicine

## 2019-10-19 VITALS — BP 140/64 | HR 68 | Ht 67.0 in | Wt 139.8 lb

## 2019-10-19 DIAGNOSIS — Z79899 Other long term (current) drug therapy: Secondary | ICD-10-CM | POA: Diagnosis not present

## 2019-10-19 DIAGNOSIS — I4819 Other persistent atrial fibrillation: Secondary | ICD-10-CM | POA: Diagnosis not present

## 2019-10-19 DIAGNOSIS — Z95810 Presence of automatic (implantable) cardiac defibrillator: Secondary | ICD-10-CM

## 2019-10-19 DIAGNOSIS — I5022 Chronic systolic (congestive) heart failure: Secondary | ICD-10-CM

## 2019-10-19 DIAGNOSIS — I200 Unstable angina: Secondary | ICD-10-CM

## 2019-10-19 DIAGNOSIS — I255 Ischemic cardiomyopathy: Secondary | ICD-10-CM | POA: Diagnosis not present

## 2019-10-19 DIAGNOSIS — I493 Ventricular premature depolarization: Secondary | ICD-10-CM | POA: Diagnosis not present

## 2019-10-19 NOTE — Progress Notes (Addendum)
Subjective:   [name] Christensen is a 85 y.o. male who presents for Medicare Annual/Subsequent preventive examination. I connected with patient by a telephone and verified that I am speaking with the correct person using two identifiers. Patient stated full name and DOB. Patient gave permission to continue with telephonic visit. Patient's location was at home and Nurse's location was at [place] office. Participants during this visit included patient and nurse.  Review of Systems:   Cardiac Risk Factors include: advanced age (>34men, >38 women);dyslipidemia;male gender;hypertension Sleep patterns: feels rested on waking, gets up 1-2 times nightly to void and sleeps 7-8 hours nightly.    Home Safety/Smoke Alarms: Feels safe in home. Smoke alarms in place.  Living environment; residence and Firearm Safety: 2-story house. Lives with wife, no needs for DME, good support system Seat Belt Safety/Bike Helmet: Wears seat belt.   PSA-  Lab Results  Component Value Date   PSA 12.45 (H) 02/06/2016   PSA 10.39 (H) 08/03/2014   PSA 7.17 (H) 01/22/2013       Objective:    Vitals: There were no vitals taken for this visit.  There is no height or weight on file to calculate BMI.  Advanced Directives 10/25/2019 07/05/2019 05/14/2019 01/04/2019 09/16/2018 06/05/2017 06/05/2017  Does Patient Have a Medical Advance Directive? Yes Yes No Yes Yes Yes Yes  Type of Estate agent of [place];Living will Healthcare Power of [place];Living will - Healthcare Power of [place];Living will Healthcare Power of [place];Living will Healthcare Power of [employer] of [place];Living will  Does patient want to make changes to medical advance directive? - No - Patient declined - No - Patient declined - No - Patient declined -  Copy of Healthcare Power of Attorney in Chart? No - copy requested Yes - validated most recent copy scanned in chart (See row information) - No - copy requested No -  copy requested Yes Yes  Would patient like information on creating a medical advance directive? - - No - Guardian declined - - - -  Pre-existing out of facility DNR order (yellow form or pink MOST form) - - - - - - -    Tobacco Social History   Tobacco Use  Smoking Status Never Smoker  Smokeless Tobacco Never Used     Counseling given: Not Answered  Past Medical History:  Diagnosis Date  . AICD (automatic cardioverter/defibrillator) present 04/07/2017  . Allergy   . Anxiety   . BPH (benign prostatic hypertrophy)    elevated PSA Dr. Lindell Noe Bx 2010  . Cardiac arrest [facility])    a. out-of-hospital arrest 11/16/2012 - EF 40-45%, patent grafts on cath, received [employer] AICD.  [name] Carotid disease, bilateral (HCC)    a. 0-39% by doppers.  . Cataract    bil cataracts removed  . CHF (congestive heart failure) (HCC)   . Coronary artery disease    a. s/p MI/CABG 2005. b. s/p cath at time of VF arrest 10/2013 - grafts patent.  . Cough   . Diverticulosis   . Elevated LFTs    a. 10/2012 felt due to cardiac arrest - Hepatitis C Ab reactive from 10/27/2012>>Hep C RNA PCR negative 11/14/2012.   [name] GERD (gastroesophageal reflux disease)   . Hyperlipidemia   . Hypertension   . Internal hemorrhoids   . Myocardial infarction (HCC)    2005 - CABG x 5 2005  . RBBB   . Tubular adenoma of colon   . Ventricular bigeminy  a. Event monitor 01/2013: NSR with PVCs and occ bigeminy.   Past Surgical History:  Procedure Laterality Date  . APPENDECTOMY    . CARDIAC CATHETERIZATION  2007   with patent graft anatomy atretic left internal mammary  artery to the LAD which is nonobstructive. Will restart study June 08, 2007  . CARDIOVERSION N/A 03/27/2018   Procedure: CARDIOVERSION;  Surgeon: Iran Ouch, MD;  Location: [facility] ORS;  Service: Cardiovascular;  Laterality: N/A;  . CARDIOVERSION N/A 01/04/2019   Procedure: CARDIOVERSION (CATH LAB);  Surgeon: Iran Ouch, MD;  Location: [facility] ORS;  Service:  Cardiovascular;  Laterality: N/A;  . CARDIOVERSION N/A 07/07/2019   Procedure: CARDIOVERSION;  Surgeon: Wendall Stade, MD;  Location: [facility] ENDOSCOPY;  Service: Cardiovascular;  Laterality: N/A;  . CATARACT EXTRACTION W/PHACO Right 04/23/2016   Procedure: CATARACT EXTRACTION PHACO AND INTRAOCULAR LENS PLACEMENT (IOC);  Surgeon: [name], MD;  Location: [facility] ORS;  Service: Ophthalmology;  Laterality: Right;  [place] 1.09 AP% 19.3 CDE 13.38 Fluid pack lot # [id number]  . CHOLECYSTECTOMY N/A 06/05/2017   Procedure: LAPAROSCOPIC CHOLECYSTECTOMY WITH INTRAOPERATIVE CHOLANGIOGRAM ERAS PATHWAY POSSIBLE NEEDLE CORE BIOPSY OF LIVER;  Surgeon: [name], MD;  Location: [facility] OR;  Service: General;  Laterality: N/A;  ERAS PATHWAY  . COLONOSCOPY    . CORONARY ARTERY BYPASS GRAFT  2005  . EYE SURGERY    . ICD GENERATOR CHANGEOUT N/A 04/07/2017   Procedure: ICD Generator Changeout;  Surgeon: Duke Salvia, MD;  Location[facility] INVASIVE CV LAB;  Service: Cardiovascular;  Laterality: N/A;  . IMPLANTABLE CARDIOVERTER DEFIBRILLATOR IMPLANT N/A 10/29/2012   STJ single chamber ICD implanted by Dr Graciela Husbands for cardiac arrest   . INGUINAL HERNIA REPAIR Bilateral 01/17/2015   Procedure: BILATERAL LAPAROSCOPIC INGUINAL HERNIA REPAIR WITH LEFT FEMORAL HERNIA REPAIR;  Surgeon: [name], MD;  Location: [facility] OR;  Service: General;  Laterality: Bilateral;  . INSERTION OF MESH Bilateral 01/17/2015   Procedure: INSERTION OF MESH;  Surgeon: [name], MD;  Location: [facility] OR;  Service: General;  Laterality: Bilateral;  . LAPAROSCOPIC CHOLECYSTECTOMY  06/05/2017  . LEAD INSERTION N/A 04/07/2017   Procedure: RA Lead Insertion;  Surgeon: Duke Salvia, MD;  Location: [facility] INVASIVE CV LAB;  Service: Cardiovascular;  Laterality: N/A;  . LEAD REVISION/REPAIR N/A 04/08/2017   Procedure: Atrial Lead Revision/Repair;  Surgeon: Duke Salvia, MD;  Location: [facility] INVASIVE CV LAB;  Service: Cardiovascular;  Laterality: N/A;  . LEFT HEART CATH  AND CORONARY ANGIOGRAPHY Left 05/14/2019   Procedure: LEFT HEART CATH AND CORONARY ANGIOGRAPHY;  Surgeon: Iran Ouch, MD;  Location: [facility] INVASIVE CV LAB;  Service: Cardiovascular;  Laterality: Left;  . LEFT HEART CATHETERIZATION WITH CORONARY/GRAFT ANGIOGRAM N/A 11/15/2012   Procedure: LEFT HEART CATHETERIZATION WITH Isabel Caprice;  Surgeon: [name], MD;  Location[facility] C[facility] LAB;  Service: Cardiovascular;  Laterality: N/A;  . LIVER BIOPSY N/A 06/05/2017   Procedure: SINGLE SITE LIVER BIOPSY ERAS PATHWAY;  Surgeon: [name], MD;  Location: [facility] OR;  Service: General;  Laterality: N/A;  ERAS PATHWAY  . LUMBAR FUSION  09/2007   Family History  Problem Relation Age of Onset  . Coronary artery disease Mother   . Heart attack Brother   . Stomach cancer Neg Hx   . Colon cancer Neg Hx   . Esophageal cancer Neg Hx   . Pancreatic cancer Neg Hx   . Prostate cancer Neg Hx   . Rectal cancer Neg Hx   . Hypercalcemia Neg Hx  Social History   Socioeconomic History  . Marital status: Married    Spouse name: Not on file  . Number of children: 5  . Years of education: Not on file  . Highest education level: Not on file  Occupational History  . 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 Financial resource strain: Not hard at all  . Food insecurity    Worry: Never true    Inability: Never true  . Transportation needs    Medical: No    Non-medical: No  Tobacco Use  . Smoking status: Never Smoker  . Smokeless tobacco: Never Used  Substance and Sexual Activity  . Alcohol use: No  . Drug use: No  . Sexual activity: Never  Lifestyle  . Physical activity    Days per week: 6 days    Minutes per session: 50 min  . Stress: Not at all  Relationships  . Social connections    Talks on phone: More than three times a week    Gets together: More than three times a week    Attends religious service: More than 4 times per year    Active member of  club or organization: Yes    Attends meetings of clubs or organizations: More than 4 times per year    Relationship status: Married  Other Topics Concern  . Not on file  Social History Narrative  . Not on file    Outpatient Encounter Medications as of 10/25/2019  Medication Sig  . acetaminophen (TYLENOL) 500 MG tablet Take 500 mg by mouth at bedtime.  [name] alfuzosin (UROXATRAL) 10 MG 24 hr tablet Take 10 mg by mouth every evening.   [name] atorvastatin (LIPITOR) 20 MG tablet Take 1 tablet (20 mg total) by mouth daily.  . cholecalciferol (VITAMIN D3) 10 MCG (400 UNIT) TABS tablet Take 1 tablet (400 Units total) by mouth daily.  [name] dofetilide (TIKOSYN) 250 MCG capsule Take 1 capsule (250 mcg total) by mouth 2 (two) times daily.  [name] dutasteride (AVODART) 0.5 MG capsule Take 0.5 mg by mouth every evening.   [name] ELIQUIS 5 MG TABS tablet TAKE 1 TABLET BY MOUTH TWICE (2) DAILY  . esomeprazole (NEXIUM) 20 MG capsule Take 1 capsule (20 mg total) by mouth daily at 12 noon.  [name] LINZESS 290 MCG CAPS capsule TAKE 1 CAPSULE BY MOUTH DAILY AS NEEDED (Patient taking differently: Take 290 mcg by mouth daily as needed (constipation). )  . loratadine (CLARITIN) 10 MG tablet Take 1 tablet (10 mg total) by mouth daily.  . magnesium oxide (MAG-OX) 400 MG tablet Take 1 tablet (400 mg total) by mouth daily.  . metoprolol succinate (TOPROL-XL) 50 MG 24 hr tablet Take 50 mg by mouth daily. Take with or immediately following a meal.  . Multiple Vitamin (MULTIVITAMIN WITH MINERALS) TABS tablet Take 1 tablet by mouth every other day.  . nitroGLYCERIN (NITROSTAT) 0.4 MG SL tablet DISSOLVE 1 TABLET UNDER TONGUE AS NEEDEDFOR CHEST PAIN. MAY REPEAT 5 MINUTES APART 3 TIMES IF NEEDED  . Polyethyl Glycol-Propyl Glycol (SYSTANE OP) Place 1 drop into both eyes daily as needed (burning eyes).   . vitamin B-12 (CYANOCOBALAMIN) 1000 MCG tablet Take 1,000 mcg by mouth daily.   Facility-Administered Encounter Medications as of 10/25/2019   Medication  . sodium chloride flush (NS) 0.9 % injection 3 mL    Activities of Daily Living In your present state of health, do you have any difficulty performing the following  activities: 10/25/2019 07/05/2019  Hearing? N N  Vision? N N  Difficulty concentrating or making decisions? N N  Walking or climbing stairs? N N  Dressing or bathing? N N  Doing errands, shopping? N N  Preparing Food and eating ? N -  Using the Toilet? N -  In the past six months, have you accidently leaked urine? N -  Do you have problems with loss of bowel control? N -  Managing your Medications? N -  Managing your Finances? N -  Housekeeping or managing your Housekeeping? N -  Some recent data might be hidden    Patient Care Team: Plotnikov, Georgina Quint, MD as PCP - General (Internal Medicine) Wendall Stade, MD as PCP - Cardiology (Cardiology) Duke Salvia, MD as PCP - Electrophysiology (Cardiology) Lindell Noe [employer] Prom., MD (Urology) [name], MD as Consulting Physician (General Surgery) Pyrtle, Carie Caddy, MD as Consulting Physician (Gastroenterology) [name], MD as Referring Physician (Ophthalmology)   Assessment:   This is a routine wellness examination for [employer]. Physical assessment deferred to PCP.  Exercise Activities and Dietary recommendations Current Exercise Habits: Home exercise routine, Type of exercise: walking, Time (Minutes): 35, Frequency (Times/Week): 5, Weekly Exercise (Minutes/Week): 175, Intensity: Mild, Exercise limited by: orthopedic condition(s)  Diet (meal preparation, eat out, water intake, caffeinated beverages, dairy products, fruits and vegetables): in general, a "healthy" diet  , well balanced. eats a variety of fruits and vegetables daily, limits salt, fat/cholesterol, sugar,carbohydrates,caffeine, drinks 6-8 glasses of water daily.   Goals    . Maintain current health status     Continue to exercise, eat healthy, enjoy life, family and stay spiritually  connected to God.     . Patient Stated     Stay as healthy and as independent as possible       Fall Risk Fall Risk  10/25/2019 09/16/2018 05/12/2018 04/24/2017 10/02/2016  Falls in the past year? 1 Yes No Yes Yes  Number falls in past yr: 0 1 - 1 1  Injury with Fall? 0 Yes - - No  Risk for fall due to : Impaired balance/gait - - - -  Follow up - Falls prevention discussed - - -  Comment - incidental accident - - -   Is the patient's home free of loose throw rugs in walkways, pet beds, electrical cords, etc?   yes      Grab bars in the bathroom? no      Handrails on the stairs?   yes      Adequate lighting?   yes  Depression Screen PHQ 2/9 Scores 10/25/2019 09/16/2018 05/12/2018 04/24/2017  PHQ - 2 Score 0 0 0 0    Cognitive Function       Ad8 score reviewed for issues:  Issues making decisions: no  Less interest in hobbies / activities: no  Repeats questions, stories (family complaining): no  Trouble using ordinary gadgets (microwave, computer, phone):no  Forgets the month or year: no  Mismanaging finances: no  Remembering appts: no  Daily problems with thinking and/or memory: no Ad8 score is= 0  Immunization History  Administered Date(s) Administered  . Fluad Quad(high Dose 65+) 09/29/2019  . Influenza Whole 09/07/2008, 12/11/2009, 08/26/2011, 09/25/2012  . Influenza, High Dose Seasonal PF 08/05/2016, 08/11/2017, 08/17/2018  . Influenza,inj,Quad PF,6+ Mos 08/30/2013, 08/03/2014, 08/09/2015  . Pneumococcal Conjugate-13 01/31/2014  . Pneumococcal Polysaccharide-23 09/08/2007, 06/04/2016  . Td 09/09/2001, 12/26/2011  . Tdap 08/17/2018  . Zoster Recombinat (Shingrix) 08/04/2018, 11/05/2018  Screening Tests Health Maintenance  Topic Date Due  . TETANUS/TDAP  08/17/2028  . INFLUENZA VACCINE  Completed  . PNA vac Low Risk Adult  Completed        Plan:    Reviewed health maintenance screenings with patient today and relevant education, vaccines, and/or  referrals were provided.   Continue to eat heart healthy diet (full of fruits, vegetables, whole grains, lean protein, water--limit salt, fat, and sugar intake) and increase physical activity as tolerated.  Continue doing brain stimulating activities (puzzles, reading, adult coloring books, staying active) to keep memory sharp.   I have personally reviewed and noted the following in the patient's chart:   . Medical and social history . Use of alcohol, tobacco or illicit drugs  . Current medications and supplements . Functional ability and status . Nutritional status . Physical activity . Advanced directives . List of other physicians . Screenings to include cognitive, depression, and falls . Referrals and appointments  In addition, I have reviewed and discussed with patient certain preventive protocols, quality metrics, and best practice recommendations. A written personalized care plan for preventive services as well as general preventive health recommendations were provided to patient.     Wanda Plump, RN  10/25/2019  Medical screening examination/treatment/procedure(s) were performed by non-physician practitioner and as supervising physician I was immediately available for consultation/collaboration. I agree with above. Jacinta Shoe, MD

## 2019-10-19 NOTE — Progress Notes (Signed)
Patient Care Team: Plotnikov, Georgina Quint, MD as PCP - General (Internal Medicine) Wendall Stade, MD as PCP - Cardiology (Cardiology) Duke Salvia, MD as PCP - Electrophysiology (Cardiology) Lindell Noe Lehman Prom., MD (Urology) [name], MD as Consulting Physician (General Surgery) Pyrtle, Carie Caddy, MD as Consulting Physician (Gastroenterology) [name], MD as Referring Physician (Ophthalmology)   HPI  [name] Christensen is a 85 y.o. male Since last being seen in our clinic for ischemic cardiomyopathy, frequent PVCs, worsening LV function and chronic systolic heart failure, persistent atrial fibrillation; the patient reports having lost his appetite in the last month.   Admitted for dofetilide and DCCV 8/20--seen in afib clinic a few weeks later and feeling much better  9/20 felt better;  Have been considering AFib, Pacing and PVCs as cause of his symptoms and LV dtysfunction>> reprogramming of his device eliminated pacing ( RBBB), dofetilide >>sinus, so the PVCs remain unaddressed with failed Anti-arrhythmic drugs suppression   Overall, he continues to feel a great deal better.  He is now able to keep up with his wife with a walker.  He has noted some sweats at night as well as a little bit of headache.  These are new on dofetilide.  He continues to have some anorexia.  This antedates the dofetilide.  He continues to feel good, less dyspnea less angina no edema  Not needing NTG ( prev 3/we)  DATE TEST PVC  1/15 Holter 13%  1/18 Holter 31%  2/20 ZIO 7.2% ( all one morphology)   9/20 Pacer 12%  11/20 Pacer 11%     DATE TEST EF   12/13 Cath   Patent Grafts  2/15 Echo   45-50 %   12/17 Echo   50-55 %   4/19 Echo 20-25%   10/19 Echo  25-30%   1/20 Echo  20-25%   6/20 LHC 20-25% LIMA-LADatretic SVG-OM,SVG-D with filling of LAD,SVG-OM patent  11/20 Echo  25-30%    Date Cr K Mg Hgb  6/20 1.14 4.9 2.0(3/20) 12.5   8/20 1.03 4.2 1.8 12.8    Antiarrhythmics Date Reason stopped  amio 7/19    dofetilide          Records and Results Reviewed *  Past Medical History:  Diagnosis Date  . AICD (automatic cardioverter/defibrillator) present 04/07/2017  . Allergy   . Anxiety   . BPH (benign prostatic hypertrophy)    elevated PSA Dr. Lindell Noe Bx 2010  . Cardiac arrest [facility])    a. out-of-hospital arrest 11/24/2012 - EF 40-45%, patent grafts on cath, received St. Jude AICD.  [name] Carotid disease, bilateral (HCC)    a. 0-39% by doppers.  . Cataract    bil cataracts removed  . CHF (congestive heart failure) (HCC)   . Coronary artery disease    a. s/p MI/CABG 2005. b. s/p cath at time of VF arrest 10/2013 - grafts patent.  . Cough   . Diverticulosis   . Elevated LFTs    a. 10/2012 felt due to cardiac arrest - Hepatitis C Ab reactive from 11/20/2012>>Hep C RNA PCR negative 10/25/2012.   [name] GERD (gastroesophageal reflux disease)   . Hyperlipidemia   . Hypertension   . Internal hemorrhoids   . Myocardial infarction (HCC)    2005 - CABG x 5 2005  . RBBB   . Tubular adenoma of colon   . Ventricular bigeminy    a. Event monitor 01/2013: NSR with PVCs and occ  bigeminy.    Past Surgical History:  Procedure Laterality Date  . APPENDECTOMY    . CARDIAC CATHETERIZATION  2007   with patent graft anatomy atretic left internal mammary  artery to the LAD which is nonobstructive. Will restart study June 08, 2007  . CARDIOVERSION N/A 03/27/2018   Procedure: CARDIOVERSION;  Surgeon: Iran Ouch, MD;  Location: [facility] ORS;  Service: Cardiovascular;  Laterality: N/A;  . CARDIOVERSION N/A 01/04/2019   Procedure: CARDIOVERSION (CATH LAB);  Surgeon: Iran Ouch, MD;  Location: [facility] ORS;  Service: Cardiovascular;  Laterality: N/A;  . CARDIOVERSION N/A 07/07/2019   Procedure: CARDIOVERSION;  Surgeon: Wendall Stade, MD;  Location: [facility] ENDOSCOPY;  Service: Cardiovascular;  Laterality: N/A;  . CATARACT EXTRACTION W/PHACO Right 04/23/2016    Procedure: CATARACT EXTRACTION PHACO AND INTRAOCULAR LENS PLACEMENT (IOC);  Surgeon: [name], MD;  Location: [facility] ORS;  Service: Ophthalmology;  Laterality: Right;  [place] 1.09 AP% 19.3 CDE 13.38 Fluid pack lot # [id number]  . CHOLECYSTECTOMY N/A 06/05/2017   Procedure: LAPAROSCOPIC CHOLECYSTECTOMY WITH INTRAOPERATIVE CHOLANGIOGRAM ERAS PATHWAY POSSIBLE NEEDLE CORE BIOPSY OF LIVER;  Surgeon: [name], MD;  Location: [facility] OR;  Service: General;  Laterality: N/A;  ERAS PATHWAY  . COLONOSCOPY    . CORONARY ARTERY BYPASS GRAFT  2005  . EYE SURGERY    . ICD GENERATOR CHANGEOUT N/A 04/07/2017   Procedure: ICD Generator Changeout;  Surgeon: Duke Salvia, MD;  Location: [facility] INVASIVE CV LAB;  Service: Cardiovascular;  Laterality: N/A;  . IMPLANTABLE CARDIOVERTER DEFIBRILLATOR IMPLANT N/A 11/14/2012   STJ single chamber ICD implanted by Dr Graciela Husbands for cardiac arrest   . INGUINAL HERNIA REPAIR Bilateral 01/17/2015   Procedure: BILATERAL LAPAROSCOPIC INGUINAL HERNIA REPAIR WITH LEFT FEMORAL HERNIA REPAIR;  Surgeon: [name], MD;  Location: [facility] OR;  Service: General;  Laterality: Bilateral;  . INSERTION OF MESH Bilateral 01/17/2015   Procedure: INSERTION OF MESH;  Surgeon: [name], MD;  Location: [facility] OR;  Service: General;  Laterality: Bilateral;  . LAPAROSCOPIC CHOLECYSTECTOMY  06/05/2017  . LEAD INSERTION N/A 04/07/2017   Procedure: RA Lead Insertion;  Surgeon: Duke Salvia, MD;  Location: [facility] INVASIVE CV LAB;  Service: Cardiovascular;  Laterality: N/A;  . LEAD REVISION/REPAIR N/A 04/08/2017   Procedure: Atrial Lead Revision/Repair;  Surgeon: Duke Salvia, MD;  Location: [facility] INVASIVE CV LAB;  Service: Cardiovascular;  Laterality: N/A;  . LEFT HEART CATH AND CORONARY ANGIOGRAPHY Left 05/14/2019   Procedure: LEFT HEART CATH AND CORONARY ANGIOGRAPHY;  Surgeon: Iran Ouch, MD;  Location: [facility] INVASIVE CV LAB;  Service: Cardiovascular;  Laterality: Left;  . LEFT HEART CATHETERIZATION WITH  CORONARY/GRAFT ANGIOGRAM N/A 10/28/2012   Procedure: LEFT HEART CATHETERIZATION WITH Isabel Caprice;  Surgeon: [name], MD;  Location: [facility] CATH LAB;  Service: Cardiovascular;  Laterality: N/A;  . LIVER BIOPSY N/A 06/05/2017   Procedure: SINGLE SITE LIVER BIOPSY ERAS PATHWAY;  Surgeon: [name], MD;  Location: [facility] OR;  Service: General;  Laterality: N/A;  ERAS PATHWAY  . LUMBAR FUSION  09/2007    Current Meds  Medication Sig  . acetaminophen (TYLENOL) 500 MG tablet Take 500 mg by mouth at bedtime.  [name] alfuzosin (UROXATRAL) 10 MG 24 hr tablet Take 10 mg by mouth every evening.   [name] atorvastatin (LIPITOR) 20 MG tablet Take 1 tablet (20 mg total) by mouth daily.  . cholecalciferol (VITAMIN D3) 10 MCG (400 UNIT) TABS tablet Take 1 tablet (400 Units total) by mouth daily.  [name] dofetilide (  TIKOSYN) 250 MCG capsule Take 1 capsule (250 mcg total) by mouth 2 (two) times daily.  [name] dutasteride (AVODART) 0.5 MG capsule Take 0.5 mg by mouth every evening.   [name] ELIQUIS 5 MG TABS tablet TAKE 1 TABLET BY MOUTH TWICE (2) DAILY  . esomeprazole (NEXIUM) 20 MG capsule Take 1 capsule (20 mg total) by mouth daily at 12 noon.  [name] LINZESS 290 MCG CAPS capsule TAKE 1 CAPSULE BY MOUTH DAILY AS NEEDED (Patient taking differently: Take 290 mcg by mouth daily as needed (constipation). )  . loratadine (CLARITIN) 10 MG tablet Take 1 tablet (10 mg total) by mouth daily.  . magnesium oxide (MAG-OX) 400 MG tablet Take 1 tablet (400 mg total) by mouth daily.  . metoprolol succinate (TOPROL-XL) 50 MG 24 hr tablet Take 50 mg by mouth daily. Take with or immediately following a meal.  . Multiple Vitamin (MULTIVITAMIN WITH MINERALS) TABS tablet Take 1 tablet by mouth every other day.  . nitroGLYCERIN (NITROSTAT) 0.4 MG SL tablet DISSOLVE 1 TABLET UNDER TONGUE AS NEEDEDFOR CHEST PAIN. MAY REPEAT 5 MINUTES APART 3 TIMES IF NEEDED  . Polyethyl Glycol-Propyl Glycol (SYSTANE OP) Place 1 drop into both eyes daily as needed  (burning eyes).   . vitamin B-12 (CYANOCOBALAMIN) 1000 MCG tablet Take 1,000 mcg by mouth daily.   Current Facility-Administered Medications for the 10/19/19 encounter (Office Visit) with Duke Salvia, MD  Medication  . sodium chloride flush (NS) 0.9 % injection 3 mL   Current Outpatient Medications on File Prior to Visit  Medication Sig Dispense Refill  . acetaminophen (TYLENOL) 500 MG tablet Take 500 mg by mouth at bedtime.    [name] alfuzosin (UROXATRAL) 10 MG 24 hr tablet Take 10 mg by mouth every evening.     [name] atorvastatin (LIPITOR) 20 MG tablet Take 1 tablet (20 mg total) by mouth daily. 90 tablet 3  . cholecalciferol (VITAMIN D3) 10 MCG (400 UNIT) TABS tablet Take 1 tablet (400 Units total) by mouth daily. 30 tablet 11  . dofetilide (TIKOSYN) 250 MCG capsule Take 1 capsule (250 mcg total) by mouth 2 (two) times daily. 60 capsule 6  . dutasteride (AVODART) 0.5 MG capsule Take 0.5 mg by mouth every evening.     [name] esomeprazole (NEXIUM) 20 MG capsule Take 1 capsule (20 mg total) by mouth daily at 12 noon. 30 capsule 3  . LINZESS 290 MCG CAPS capsule TAKE 1 CAPSULE BY MOUTH DAILY AS NEEDED (Patient taking differently: Take 290 mcg by mouth daily as needed (constipation). ) 30 capsule 11  . loratadine (CLARITIN) 10 MG tablet Take 1 tablet (10 mg total) by mouth daily. 30 tablet 11  . magnesium oxide (MAG-OX) 400 MG tablet Take 1 tablet (400 mg total) by mouth daily. 30 tablet 6  . metoprolol succinate (TOPROL-XL) 50 MG 24 hr tablet Take 50 mg by mouth daily. Take with or immediately following a meal.    . Multiple Vitamin (MULTIVITAMIN WITH MINERALS) TABS tablet Take 1 tablet by mouth every other day.    . nitroGLYCERIN (NITROSTAT) 0.4 MG SL tablet DISSOLVE 1 TABLET UNDER TONGUE AS NEEDEDFOR CHEST PAIN. MAY REPEAT 5 MINUTES APART 3 TIMES IF NEEDED 25 tablet 0  . Polyethyl Glycol-Propyl Glycol (SYSTANE OP) Place 1 drop into both eyes daily as needed (burning eyes).     . vitamin B-12  (CYANOCOBALAMIN) 1000 MCG tablet Take 1,000 mcg by mouth daily.     Current Facility-Administered Medications on File Prior to Visit  Medication Dose Route Frequency Provider Last Rate Last Dose  . sodium chloride flush (NS) 0.9 % injection 3 mL  3 mL Intravenous Q12H Duke Salvia, MD         Allergies  Allergen Reactions  . Ezetimibe Other (See Comments)    Pt was in coma for 6 days, numbness  . Penicillins Other (See Comments)    BLISTERS BETWEEN FINGERS Did it involve swelling of the face/tongue/throat, SOB, or low BP? No Did it involve sudden or severe rash/hives, skin peeling, or any reaction on the inside of your mouth or nose? Yes Did you need to seek medical attention at a hospital or doctor's office? No When did it last happen?60 years If all above answers are "NO", may proceed with cephalosporin use.   . Pravastatin Sodium Other (See Comments)    Aches and pains in joints  . Rosuvastatin Other (See Comments)    MYALGIAS  . Niacin Anxiety and Other (See Comments)    "Makes me feel nervous, jittery"      Review of Systems negative except from HPI and PMH  Physical Exam BP 140/64 (BP Location: Left Arm, Patient Position: Sitting, Cuff Size: Normal)   Pulse 68   Ht 5\' 7"  (1.702 m)   Wt 139 lb 12 oz (63.4 kg)   BMI 21.89 kg/m  Well developed and well nourished in no acute distress HENT normal Neck supple with JVP-flat Clear Device pocket well healed; without hematoma or erythema.  There is no tethering  Regular rate and rhythm, no * * murmur Abd-soft with active BS No Clubbing cyanosis   edema Skin-warm and dry A & Oriented  Grossly normal sensory and motor function  ECG sinus @ 68 32/18/50 RBBB/LAD PVC        ASSESSMENT & PLAN:    Atrial fibrillation/flutter-persistent  PVCs-right bundle branch superior axis identified by the device with decreased ventricular pacing  Cardiomyopathy recurrent  History of aborted sudden cardiac death     Implantable defibrillator-St. Jude   upgraded single-dual-chamber May 2018  The patient's device was interrogated.  The information was reviewed. No changes were made in the programming.     Ischemic cardiomyopathy with prior CABG    Congestive Heart Failure chronic systolic  High Risk Medication Surveillance,Dofetilide  Right bundle branch block.  Sinus bradycardia/chronotropic incompetence  No intercurrent atrial fibrillation or flutter  On Anticoagulation;  No bleeding issues   PVC burden unchanged but feeling much better so will not pursue ablation  Less angina in sinus, perhaps because of bradycardia  Doretilide labs ok 8/20   Euvolemic continue current meds   .  Sherryl Manges, MD  10/19/2019 11:08 AM     [facility] [address] [place] [address] [phone] (office) [phone] (fax)

## 2019-10-19 NOTE — Patient Instructions (Signed)
Medication Instructions:  - Your physician recommends that you continue on your current medications as directed. Please refer to the Current Medication list given to you today.  *If you need a refill on your cardiac medications before your next appointment, please call your pharmacy*  Lab Work: - none ordered  If you have labs (blood work) drawn today and your tests are completely normal, you will receive your results only by: [name] MyChart Message (if you have MyChart) OR . A paper copy in the mail If you have any lab test that is abnormal or we need to change your treatment, we will call you to review the results.  Testing/Procedures: - none ordered  Follow-Up: At [facility], you and your health needs are our priority.  As part of our continuing mission to provide you with exceptional heart care, we have created designated Provider Care Teams.  These Care Teams include your primary Cardiologist (physician) and Advanced Practice Providers (APPs -  Physician Assistants and Nurse Practitioners) who all work together to provide you with the care you need, when you need it.  Your next appointment:   3 month(s)  The format for your next appointment:   In Person  Provider:   Virl Axe, MD  Other Instructions n/a

## 2019-10-25 ENCOUNTER — Ambulatory Visit (INDEPENDENT_AMBULATORY_CARE_PROVIDER_SITE_OTHER): Payer: Medicare Other | Admitting: *Deleted

## 2019-10-25 DIAGNOSIS — Z0000 Encounter for general adult medical examination without abnormal findings: Secondary | ICD-10-CM

## 2019-10-27 NOTE — Progress Notes (Signed)
Remote ICD transmission.   

## 2019-11-05 ENCOUNTER — Other Ambulatory Visit: Payer: Self-pay | Admitting: Internal Medicine

## 2019-11-16 LAB — CUP PACEART INCLINIC DEVICE CHECK
Battery Remaining Longevity: 67 mo
Brady Statistic RA Percent Paced: 8.5 %
Brady Statistic RV Percent Paced: 2.5 %
Date Time Interrogation Session: 20201124111100
HighPow Impedance: 71 Ohm
Implantable Lead Implant Date: 20131218
Implantable Lead Implant Date: 20180514
Implantable Lead Location: 753859
Implantable Lead Location: 753860
Implantable Lead Model: 5076
Implantable Pulse Generator Implant Date: 20180514
Lead Channel Impedance Value: 400 Ohm
Lead Channel Impedance Value: 475 Ohm
Lead Channel Pacing Threshold Amplitude: 0.5 V
Lead Channel Pacing Threshold Amplitude: 1.25 V
Lead Channel Pacing Threshold Pulse Width: 0.5 ms
Lead Channel Pacing Threshold Pulse Width: 0.5 ms
Lead Channel Sensing Intrinsic Amplitude: 3.9 mV
Lead Channel Sensing Intrinsic Amplitude: 4.2 mV
Lead Channel Setting Pacing Amplitude: 2 V
Lead Channel Setting Pacing Amplitude: 2.5 V
Lead Channel Setting Pacing Pulse Width: 0.5 ms
Lead Channel Setting Sensing Sensitivity: 0.5 mV
Pulse Gen Serial Number: 1245762

## 2019-12-10 ENCOUNTER — Other Ambulatory Visit: Payer: Self-pay | Admitting: Internal Medicine

## 2019-12-30 ENCOUNTER — Ambulatory Visit (INDEPENDENT_AMBULATORY_CARE_PROVIDER_SITE_OTHER): Payer: PPO | Admitting: Internal Medicine

## 2019-12-30 ENCOUNTER — Encounter: Payer: Self-pay | Admitting: Internal Medicine

## 2019-12-30 ENCOUNTER — Other Ambulatory Visit: Payer: Self-pay

## 2019-12-30 DIAGNOSIS — I4819 Other persistent atrial fibrillation: Secondary | ICD-10-CM | POA: Diagnosis not present

## 2019-12-30 DIAGNOSIS — I4892 Unspecified atrial flutter: Secondary | ICD-10-CM

## 2019-12-30 DIAGNOSIS — I480 Paroxysmal atrial fibrillation: Secondary | ICD-10-CM

## 2019-12-30 DIAGNOSIS — R739 Hyperglycemia, unspecified: Secondary | ICD-10-CM

## 2019-12-30 DIAGNOSIS — E785 Hyperlipidemia, unspecified: Secondary | ICD-10-CM

## 2019-12-30 DIAGNOSIS — I10 Essential (primary) hypertension: Secondary | ICD-10-CM

## 2019-12-30 DIAGNOSIS — K7460 Unspecified cirrhosis of liver: Secondary | ICD-10-CM | POA: Diagnosis not present

## 2019-12-30 LAB — LIPID PANEL
Cholesterol: 107 mg/dL (ref 0–200)
HDL: 46.8 mg/dL (ref >39.00)
LDL Cholesterol: 39 mg/dL (ref 0–99)
NonHDL: 59.74
Total CHOL/HDL Ratio: 2
Triglycerides: 102 mg/dL (ref 0.0–149.0)
VLDL: 20.4 mg/dL (ref 0.0–40.0)

## 2019-12-30 LAB — TSH: TSH: 2.51 u[IU]/mL (ref 0.35–4.50)

## 2019-12-30 LAB — HEMOGLOBIN A1C: Hgb A1c MFr Bld: 6.1 % (ref 4.6–6.5)

## 2019-12-30 LAB — HEPATIC FUNCTION PANEL
ALT: 14 U/L (ref 0–53)
AST: 23 U/L (ref 0–37)
Albumin: 4 g/dL (ref 3.5–5.2)
Alkaline Phosphatase: 85 U/L (ref 39–117)
Bilirubin, Direct: 0.2 mg/dL (ref 0.0–0.3)
Total Bilirubin: 0.7 mg/dL (ref 0.2–1.2)
Total Protein: 7.4 g/dL (ref 6.0–8.3)

## 2019-12-30 LAB — BASIC METABOLIC PANEL
BUN: 18 mg/dL (ref 6–23)
CO2: 31 mEq/L (ref 19–32)
Calcium: 11.1 mg/dL — ABNORMAL HIGH (ref 8.4–10.5)
Chloride: 100 mEq/L (ref 96–112)
Creatinine, Ser: 1.06 mg/dL (ref 0.40–1.50)
GFR: 66.36 mL/min (ref >60.00)
Glucose, Bld: 91 mg/dL (ref 70–99)
Potassium: 4 mEq/L (ref 3.5–5.1)
Sodium: 134 mEq/L — ABNORMAL LOW (ref 135–145)

## 2019-12-30 MED ORDER — METOPROLOL SUCCINATE ER 50 MG PO TB24: 50.0000 mg | ORAL_TABLET | Freq: Two times a day (BID) | ORAL | 3 refills | Status: DC

## 2019-12-30 MED ORDER — METOPROLOL SUCCINATE ER 50 MG PO TB24: 75.0000 mg | ORAL_TABLET | Freq: Every day | ORAL | 3 refills | Status: DC

## 2019-12-30 NOTE — Assessment & Plan Note (Signed)
Lipitor 

## 2019-12-30 NOTE — Assessment & Plan Note (Signed)
On Eliquis

## 2019-12-30 NOTE — Progress Notes (Signed)
Subjective:  Patient ID: [name] Christensen, male    DOB: 01-17-1932  Age: 86 y.o. MRN: [id number]  CC: No chief complaint on file.   HPI ALONTAE SLACUM presents for CAD, BPH, anticoagulation f/u  Outpatient Medications Prior to Visit  Medication Sig Dispense Refill  . acetaminophen (TYLENOL) 500 MG tablet Take 500 mg by mouth at bedtime.    [name] alfuzosin (UROXATRAL) 10 MG 24 hr tablet Take 10 mg by mouth every evening.     [name] atorvastatin (LIPITOR) 20 MG tablet Take 1 tablet (20 mg total) by mouth daily. 90 tablet 3  . cholecalciferol (VITAMIN D3) 10 MCG (400 UNIT) TABS tablet Take 1 tablet (400 Units total) by mouth daily. 30 tablet 11  . dofetilide (TIKOSYN) 250 MCG capsule Take 1 capsule (250 mcg total) by mouth 2 (two) times daily. 60 capsule 6  . dutasteride (AVODART) 0.5 MG capsule Take 0.5 mg by mouth every evening.     [name] ELIQUIS 5 MG TABS tablet TAKE 1 TABLET BY MOUTH TWICE (2) DAILY 180 tablet 3  . esomeprazole (NEXIUM) 20 MG capsule Take 1 capsule (20 mg total) by mouth daily at 12 noon. 30 capsule 3  . LINZESS 290 MCG CAPS capsule TAKE 1 CAPSULE BY MOUTH DAILY AS NEEDED (Patient taking differently: Take 290 mcg by mouth daily as needed (constipation). ) 30 capsule 11  . loratadine (CLARITIN) 10 MG tablet Take 1 tablet (10 mg total) by mouth daily. 30 tablet 11  . magnesium oxide (MAG-OX) 400 MG tablet Take 1 tablet (400 mg total) by mouth daily. 30 tablet 6  . metoprolol succinate (TOPROL-XL) 50 MG 24 hr tablet Take 50 mg by mouth daily. Take with or immediately following a meal.    . Multiple Vitamin (MULTIVITAMIN WITH MINERALS) TABS tablet Take 1 tablet by mouth every other day.    . nitroGLYCERIN (NITROSTAT) 0.4 MG SL tablet DISSOLVE 1 TABLET UNDER TONGUE AS NEEDEDFOR CHEST PAIN. MAY REPEAT 5 MINUTES APART 3 TIMES IF NEEDED 25 tablet 0  . Polyethyl Glycol-Propyl Glycol (SYSTANE OP) Place 1 drop into both eyes daily as needed (burning eyes).     . Promethazine-Codeine 6.25-10  MG/5ML SOLN TAKE 5ML BY MOUTH EVERY 6 HOURS AS NEEDED 240 mL 0  . vitamin B-12 (CYANOCOBALAMIN) 1000 MCG tablet Take 1,000 mcg by mouth daily.     Facility-Administered Medications Prior to Visit  Medication Dose Route Frequency Provider Last Rate Last Admin  . sodium chloride flush (NS) 0.9 % injection 3 mL  3 mL Intravenous Q12H Deboraha Sprang, MD        ROS: Review of Systems  Constitutional: Negative for appetite change, fatigue and unexpected weight change.  HENT: Negative for congestion, nosebleeds, sneezing, sore throat and trouble swallowing.   Eyes: Negative for itching and visual disturbance.  Respiratory: Negative for cough.   Cardiovascular: Negative for chest pain, palpitations and leg swelling.  Gastrointestinal: Negative for abdominal distention, blood in stool, diarrhea and nausea.  Genitourinary: Negative for frequency and hematuria.  Musculoskeletal: Negative for back pain, gait problem, joint swelling and neck pain.  Skin: Negative for rash.  Neurological: Negative for dizziness, tremors, speech difficulty and weakness.  Psychiatric/Behavioral: Negative for agitation, dysphoric mood, sleep disturbance and suicidal ideas. The patient is not nervous/anxious.     Objective:  BP (!) 166/72 (BP Location: Left Arm, Patient Position: Sitting, Cuff Size: Normal)   Pulse 66   Temp 98 F (36.7 C) (Oral)   Ht 5'  7" (1.702 m)   Wt 143 lb (64.9 kg)   SpO2 96%   BMI 22.40 kg/m   BP Readings from Last 3 Encounters:  12/30/19 (!) 166/72  10/19/19 140/64  09/29/19 126/82    Wt Readings from Last 3 Encounters:  12/30/19 143 lb (64.9 kg)  10/19/19 139 lb 12 oz (63.4 kg)  09/29/19 139 lb (63 kg)    Physical Exam Constitutional:      General: He is not in acute distress.    Appearance: He is well-developed.     Comments: NAD  Eyes:     Conjunctiva/sclera: Conjunctivae normal.     Pupils: Pupils are equal, round, and reactive to light.  Neck:     Thyroid: No  thyromegaly.     Vascular: No JVD.  Cardiovascular:     Rate and Rhythm: Normal rate. Rhythm irregular.     Heart sounds: Normal heart sounds. No murmur. No friction rub. No gallop.   Pulmonary:     Effort: Pulmonary effort is normal. No respiratory distress.     Breath sounds: Normal breath sounds. No wheezing or rales.  Chest:     Chest wall: No tenderness.  Abdominal:     General: Bowel sounds are normal. There is no distension.     Palpations: Abdomen is soft. There is no mass.     Tenderness: There is no abdominal tenderness. There is no guarding or rebound.  Musculoskeletal:        General: No tenderness. Normal range of motion.     Cervical back: Normal range of motion.  Lymphadenopathy:     Cervical: No cervical adenopathy.  Skin:    General: Skin is warm and dry.     Findings: No rash.  Neurological:     Mental Status: He is alert and oriented to person, place, and time.     Cranial Nerves: No cranial nerve deficit.     Motor: No abnormal muscle tone.     Coordination: Coordination normal.     Gait: Gait normal.     Deep Tendon Reflexes: Reflexes are normal and symmetric.  Psychiatric:        Behavior: Behavior normal.        Thought Content: Thought content normal.        Judgment: Judgment normal.     Lab Results  Component Value Date   WBC 5.7 08/03/2019   HGB 12.8 (L) 08/03/2019   HCT 37.2 (L) 08/03/2019   PLT 107 (L) 08/03/2019   GLUCOSE 118 (H) 07/15/2019   CHOL 118 09/11/2016   TRIG 62.0 09/11/2016   HDL 49.60 09/11/2016   LDLCALC 56 09/11/2016   ALT 14 05/24/2019   AST 24 05/24/2019   NA 134 (L) 07/15/2019   K 4.2 07/15/2019   CL 100 07/15/2019   CREATININE 1.03 07/15/2019   BUN 17 07/15/2019   CO2 27 07/15/2019   TSH 2.65 05/24/2019   PSA 12.45 (H) 02/06/2016   INR 1.1 04/02/2017   HGBA1C 6.4 10/10/2017    No results found.  Assessment & Plan:   Walker Kehr, MD

## 2019-12-30 NOTE — Assessment & Plan Note (Signed)
Eliquis 

## 2019-12-30 NOTE — Assessment & Plan Note (Addendum)
Losartan was stopped on 05/12/19 due to high K . Off Losartan. Worse: increase Toprol to 75 mg/d

## 2019-12-30 NOTE — Assessment & Plan Note (Signed)
LFTs 

## 2020-01-05 ENCOUNTER — Ambulatory Visit (INDEPENDENT_AMBULATORY_CARE_PROVIDER_SITE_OTHER): Payer: PPO | Admitting: *Deleted

## 2020-01-05 DIAGNOSIS — I480 Paroxysmal atrial fibrillation: Secondary | ICD-10-CM | POA: Diagnosis not present

## 2020-01-05 LAB — CUP PACEART REMOTE DEVICE CHECK
Battery Remaining Longevity: 62 mo
Battery Remaining Percentage: 62 %
Battery Voltage: 2.95 V
Brady Statistic AP VP Percent: 1.5 %
Brady Statistic AP VS Percent: 21 %
Brady Statistic AS VP Percent: 2.1 %
Brady Statistic AS VS Percent: 68 %
Brady Statistic RA Percent Paced: 12 %
Brady Statistic RV Percent Paced: 3.6 %
Date Time Interrogation Session: 20210210042200
HighPow Impedance: 72 Ohm
HighPow Impedance: 72 Ohm
Implantable Lead Implant Date: 20131218
Implantable Lead Implant Date: 20180514
Implantable Lead Location: 753859
Implantable Lead Location: 753860
Implantable Lead Model: 5076
Implantable Pulse Generator Implant Date: 20180514
Lead Channel Impedance Value: 400 Ohm
Lead Channel Impedance Value: 480 Ohm
Lead Channel Pacing Threshold Amplitude: 0.5 V
Lead Channel Pacing Threshold Amplitude: 1.25 V
Lead Channel Pacing Threshold Pulse Width: 0.5 ms
Lead Channel Pacing Threshold Pulse Width: 0.5 ms
Lead Channel Sensing Intrinsic Amplitude: 3.8 mV
Lead Channel Sensing Intrinsic Amplitude: 4.1 mV
Lead Channel Setting Pacing Amplitude: 2 V
Lead Channel Setting Pacing Amplitude: 2.5 V
Lead Channel Setting Pacing Pulse Width: 0.5 ms
Lead Channel Setting Sensing Sensitivity: 0.5 mV
Pulse Gen Serial Number: 1245762

## 2020-01-05 NOTE — Progress Notes (Signed)
ICD Remote  

## 2020-01-06 ENCOUNTER — Telehealth: Payer: Self-pay

## 2020-01-06 NOTE — Telephone Encounter (Signed)
I spoke with the pt to let him know that his monitor automatically transmits. All he has to do is sleep near the monitor and it will do its job. The pt verbalized understanding and thanked me for the call.

## 2020-01-25 ENCOUNTER — Other Ambulatory Visit: Payer: Self-pay

## 2020-01-25 ENCOUNTER — Encounter: Payer: Self-pay | Admitting: Internal Medicine

## 2020-01-25 ENCOUNTER — Other Ambulatory Visit: Payer: Self-pay | Admitting: Internal Medicine

## 2020-01-25 ENCOUNTER — Other Ambulatory Visit (HOSPITAL_COMMUNITY): Payer: Self-pay | Admitting: Physician Assistant

## 2020-01-25 ENCOUNTER — Ambulatory Visit (INDEPENDENT_AMBULATORY_CARE_PROVIDER_SITE_OTHER): Payer: PPO | Admitting: Internal Medicine

## 2020-01-25 VITALS — BP 150/82 | HR 67 | Ht 67.0 in | Wt 145.0 lb

## 2020-01-25 DIAGNOSIS — I5022 Chronic systolic (congestive) heart failure: Secondary | ICD-10-CM

## 2020-01-25 DIAGNOSIS — Z79899 Other long term (current) drug therapy: Secondary | ICD-10-CM

## 2020-01-25 DIAGNOSIS — I4819 Other persistent atrial fibrillation: Secondary | ICD-10-CM

## 2020-01-25 DIAGNOSIS — I255 Ischemic cardiomyopathy: Secondary | ICD-10-CM | POA: Diagnosis not present

## 2020-01-25 DIAGNOSIS — I493 Ventricular premature depolarization: Secondary | ICD-10-CM

## 2020-01-25 DIAGNOSIS — Z95810 Presence of automatic (implantable) cardiac defibrillator: Secondary | ICD-10-CM

## 2020-01-25 NOTE — Patient Instructions (Signed)
Medication Instructions:  - Your physician recommends that you continue on your current medications as directed. Please refer to the Current Medication list given to you today.  *If you need a refill on your cardiac medications before your next appointment, please call your pharmacy*   Lab Work: - Your physician recommends that you have lab work today: CBC/ Magnesium  If you have labs (blood work) drawn today and your tests are completely normal, you will receive your results only by: [name] MyChart Message (if you have MyChart) OR . A paper copy in the mail If you have any lab test that is abnormal or we need to change your treatment, we will call you to review the results.   Testing/Procedures: - none ordered   Follow-Up: At [facility], you and your health needs are our priority.  As part of our continuing mission to provide you with exceptional heart care, we have created designated Provider Care Teams.  These Care Teams include your primary Cardiologist (physician) and Advanced Practice Providers (APPs -  Physician Assistants and Nurse Practitioners) who all work together to provide you with the care you need, when you need it.  We recommend signing up for the patient portal called "MyChart".  Sign up information is provided on this After Visit Summary.  MyChart is used to connect with patients for Virtual Visits (Telemedicine).  Patients are able to view lab/test results, encounter notes, upcoming appointments, etc.  Non-urgent messages can be sent to your provider as well.   To learn more about what you can do with MyChart, go to [email or web address].    Your next appointment:   6 month(s)  The format for your next appointment:   In Person  Provider:   Virl Axe, MD   Other Instructions n/a

## 2020-01-25 NOTE — Progress Notes (Signed)
Patient Care Team: Plotnikov, Georgina Quint, MD as PCP - General (Internal Medicine) Wendall Stade, MD as PCP - Cardiology (Cardiology) Duke Salvia, MD as PCP - Electrophysiology (Cardiology) Lindell Noe Lehman Prom., MD (Urology) [name], MD as Consulting Physician (General Surgery) Pyrtle, Carie Caddy, MD as Consulting Physician (Gastroenterology) [name], MD as Referring Physician (Ophthalmology)   HPI  [name] Christensen is a 86 y.o. male   seen in follow-up for ischemic cardiomyopathy with progressive left ventricular dysfunction in the context of frequent PVCs and chronic congestive heart failure.  He has persistent atrial fibrillation for which she was started on dofetilide 8/20    Have been considering AFib, Pacing and PVCs as cause of his symptoms and LV dtysfunction>> reprogramming of his device eliminated pacing ( RBBB), dofetilide >>sinus, so the PVCs remain unaddressed with failed Anti-arrhythmic drugs suppression   The patient denies chest pain, shortness of breath, nocturnal dyspnea, orthopnea or peripheral edema.  There have been no palpitations, lightheadedness or syncope.    DATE TEST PVC  1/15 Holter 13%  1/18 Holter 31%  2/20 ZIO 7.2% ( all one morphology)   9/20 Pacer 12%  11/20 Pacer 11%  2/21 Pacer 9.4%     DATE TEST EF   12/13 Cath   Patent Grafts  2/15 Echo   45-50 %   12/17 Echo   50-55 %   4/19 Echo 20-25%   10/19 Echo  25-30%   1/20 Echo  20-25%   6/20 LHC 20-25% LIMA-LADatretic SVG-OM,SVG-D with filling of LAD,SVG-OM patent  11/20 Echo  25-30%    Date Cr K Mg Hgb  6/20 1.14 4.9 2.0(3/20) 12.5   8/20 1.03 4.2 1.8 12.8  2/21 1.06 4.0     Antiarrhythmics Date Reason stopped  amio 7/19    dofetilide          Records and Results Reviewed *  Past Medical History:  Diagnosis Date  . AICD (automatic cardioverter/defibrillator) present 04/07/2017  . Allergy   . Anxiety   . BPH (benign prostatic hypertrophy)     elevated PSA Dr. Lindell Noe Bx 2010  . Cardiac arrest [facility])    a. out-of-hospital arrest 11/17/2012 - EF 40-45%, patent grafts on cath, received St. Jude AICD.  [name] Carotid disease, bilateral (HCC)    a. 0-39% by doppers.  . Cataract    bil cataracts removed  . CHF (congestive heart failure) (HCC)   . Coronary artery disease    a. s/p MI/CABG 2005. b. s/p cath at time of VF arrest 10/2013 - grafts patent.  . Cough   . Diverticulosis   . Elevated LFTs    a. 10/2012 felt due to cardiac arrest - Hepatitis C Ab reactive from 10/27/2012>>Hep C RNA PCR negative 10/30/2012.   [name] GERD (gastroesophageal reflux disease)   . Hyperlipidemia   . Hypertension   . Internal hemorrhoids   . Myocardial infarction (HCC)    2005 - CABG x 5 2005  . RBBB   . Tubular adenoma of colon   . Ventricular bigeminy    a. Event monitor 01/2013: NSR with PVCs and occ bigeminy.    Past Surgical History:  Procedure Laterality Date  . APPENDECTOMY    . CARDIAC CATHETERIZATION  2007   with patent graft anatomy atretic left internal mammary  artery to the LAD which is nonobstructive. Will restart study June 08, 2007  . CARDIOVERSION N/A 03/27/2018   Procedure: CARDIOVERSION;  Surgeon: Kirke Corin,  Chelsea Aus, MD;  Location: [facility] ORS;  Service: Cardiovascular;  Laterality: N/A;  . CARDIOVERSION N/A 01/04/2019   Procedure: CARDIOVERSION (CATH LAB);  Surgeon: Iran Ouch, MD;  Location: [facility] ORS;  Service: Cardiovascular;  Laterality: N/A;  . CARDIOVERSION N/A 07/07/2019   Procedure: CARDIOVERSION;  Surgeon: Wendall Stade, MD;  Location: [facility] ENDOSCOPY;  Service: Cardiovascular;  Laterality: N/A;  . CATARACT EXTRACTION W/PHACO Right 04/23/2016   Procedure: CATARACT EXTRACTION PHACO AND INTRAOCULAR LENS PLACEMENT (IOC);  Surgeon: [name], MD;  Location: [facility] ORS;  Service: Ophthalmology;  Laterality: Right;  [place] 1.09 AP% 19.3 CDE 13.38 Fluid pack lot # [id number]  . CHOLECYSTECTOMY N/A 06/05/2017   Procedure: LAPAROSCOPIC  CHOLECYSTECTOMY WITH INTRAOPERATIVE CHOLANGIOGRAM ERAS PATHWAY POSSIBLE NEEDLE CORE BIOPSY OF LIVER;  Surgeon: [name], MD;  Location: [facility] OR;  Service: General;  Laterality: N/A;  ERAS PATHWAY  . COLONOSCOPY    . CORONARY ARTERY BYPASS GRAFT  2005  . EYE SURGERY    . ICD GENERATOR CHANGEOUT N/A 04/07/2017   Procedure: ICD Generator Changeout;  Surgeon: Duke Salvia, MD;  Location: [facility] INVASIVE CV LAB;  Service: Cardiovascular;  Laterality: N/A;  . IMPLANTABLE CARDIOVERTER DEFIBRILLATOR IMPLANT N/A 11/14/2012   STJ single chamber ICD implanted by Dr Graciela Husbands for cardiac arrest   . INGUINAL HERNIA REPAIR Bilateral 01/17/2015   Procedure: BILATERAL LAPAROSCOPIC INGUINAL HERNIA REPAIR WITH LEFT FEMORAL HERNIA REPAIR;  Surgeon: [name], MD;  Location: [facility] OR;  Service: General;  Laterality: Bilateral;  . INSERTION OF MESH Bilateral 01/17/2015   Procedure: INSERTION OF MESH;  Surgeon: [name], MD;  Location: [facility] OR;  Service: General;  Laterality: Bilateral;  . LAPAROSCOPIC CHOLECYSTECTOMY  06/05/2017  . LEAD INSERTION N/A 04/07/2017   Procedure: RA Lead Insertion;  Surgeon: Duke Salvia, MD;  Location: [facility] INVASIVE CV LAB;  Service: Cardiovascular;  Laterality: N/A;  . LEAD REVISION/REPAIR N/A 04/08/2017   Procedure: Atrial Lead Revision/Repair;  Surgeon: Duke Salvia, MD;  Location: [facility] INVASIVE CV LAB;  Service: Cardiovascular;  Laterality: N/A;  . LEFT HEART CATH AND CORONARY ANGIOGRAPHY Left 05/14/2019   Procedure: LEFT HEART CATH AND CORONARY ANGIOGRAPHY;  Surgeon: Iran Ouch, MD;  Location: [facility] INVASIVE CV LAB;  Service: Cardiovascular;  Laterality: Left;  . LEFT HEART CATHETERIZATION WITH CORONARY/GRAFT ANGIOGRAM N/A 10/29/2012   Procedure: LEFT HEART CATHETERIZATION WITH Isabel Caprice;  Surgeon: [name], MD;  Location: [facility] CATH LAB;  Service: Cardiovascular;  Laterality: N/A;  . LIVER BIOPSY N/A 06/05/2017   Procedure: SINGLE SITE LIVER BIOPSY ERAS PATHWAY;   Surgeon: [name], MD;  Location: [facility] OR;  Service: General;  Laterality: N/A;  ERAS PATHWAY  . LUMBAR FUSION  09/2007    Current Meds  Medication Sig  . acetaminophen (TYLENOL) 500 MG tablet Take 500 mg by mouth at bedtime.  [name] alfuzosin (UROXATRAL) 10 MG 24 hr tablet Take 10 mg by mouth every evening.   [name] atorvastatin (LIPITOR) 20 MG tablet Take 1 tablet (20 mg total) by mouth daily.  . cholecalciferol (VITAMIN D3) 10 MCG (400 UNIT) TABS tablet Take 1 tablet (400 Units total) by mouth daily.  [name] dofetilide (TIKOSYN) 250 MCG capsule Take 1 capsule (250 mcg total) by mouth 2 (two) times daily.  [name] dutasteride (AVODART) 0.5 MG capsule Take 0.5 mg by mouth every evening.   [name] ELIQUIS 5 MG TABS tablet TAKE 1 TABLET BY MOUTH TWICE (2) DAILY  . esomeprazole (NEXIUM) 20 MG capsule Take 1 capsule (20 mg  total) by mouth daily at 12 noon.  [name] LINZESS 290 MCG CAPS capsule TAKE 1 CAPSULE BY MOUTH DAILY AS NEEDED (Patient taking differently: Take 290 mcg by mouth daily as needed (constipation). )  . loratadine (CLARITIN) 10 MG tablet Take 1 tablet (10 mg total) by mouth daily.  . magnesium oxide (MAG-OX) 400 MG tablet Take 1 tablet (400 mg total) by mouth daily.  . metoprolol succinate (TOPROL-XL) 50 MG 24 hr tablet Take 1 tablet (50 mg total) by mouth 2 (two) times daily. Take with or immediately following a meal.  . Multiple Vitamin (MULTIVITAMIN WITH MINERALS) TABS tablet Take 1 tablet by mouth every other day.  . nitroGLYCERIN (NITROSTAT) 0.4 MG SL tablet DISSOLVE 1 TABLET UNDER TONGUE AS NEEDEDFOR CHEST PAIN. MAY REPEAT 5 MINUTES APART 3 TIMES IF NEEDED  . Polyethyl Glycol-Propyl Glycol (SYSTANE OP) Place 1 drop into both eyes daily as needed (burning eyes).   . Promethazine-Codeine 6.25-10 MG/5ML SOLN TAKE BY MOUTH EVERY 6 HOURS AS NEEDED  . vitamin B-12 (CYANOCOBALAMIN) 1000 MCG tablet Take 1,000 mcg by mouth daily.   Current Facility-Administered Medications for the 01/25/20 encounter (Office  Visit) with Duke Salvia, MD  Medication  . sodium chloride flush (NS) 0.9 % injection 3 mL   Current Outpatient Medications on File Prior to Visit  Medication Sig Dispense Refill  . acetaminophen (TYLENOL) 500 MG tablet Take 500 mg by mouth at bedtime.    [name] alfuzosin (UROXATRAL) 10 MG 24 hr tablet Take 10 mg by mouth every evening.     [name] atorvastatin (LIPITOR) 20 MG tablet Take 1 tablet (20 mg total) by mouth daily. 90 tablet 3  . cholecalciferol (VITAMIN D3) 10 MCG (400 UNIT) TABS tablet Take 1 tablet (400 Units total) by mouth daily. 30 tablet 11  . dofetilide (TIKOSYN) 250 MCG capsule Take 1 capsule (250 mcg total) by mouth 2 (two) times daily. 60 capsule 6  . dutasteride (AVODART) 0.5 MG capsule Take 0.5 mg by mouth every evening.     [name] ELIQUIS 5 MG TABS tablet TAKE 1 TABLET BY MOUTH TWICE (2) DAILY 180 tablet 3  . esomeprazole (NEXIUM) 20 MG capsule Take 1 capsule (20 mg total) by mouth daily at 12 noon. 30 capsule 3  . LINZESS 290 MCG CAPS capsule TAKE 1 CAPSULE BY MOUTH DAILY AS NEEDED (Patient taking differently: Take 290 mcg by mouth daily as needed (constipation). ) 30 capsule 11  . loratadine (CLARITIN) 10 MG tablet Take 1 tablet (10 mg total) by mouth daily. 30 tablet 11  . magnesium oxide (MAG-OX) 400 MG tablet Take 1 tablet (400 mg total) by mouth daily. 30 tablet 6  . metoprolol succinate (TOPROL-XL) 50 MG 24 hr tablet Take 1 tablet (50 mg total) by mouth 2 (two) times daily. Take with or immediately following a meal. 180 tablet 3  . Multiple Vitamin (MULTIVITAMIN WITH MINERALS) TABS tablet Take 1 tablet by mouth every other day.    . nitroGLYCERIN (NITROSTAT) 0.4 MG SL tablet DISSOLVE 1 TABLET UNDER TONGUE AS NEEDEDFOR CHEST PAIN. MAY REPEAT 5 MINUTES APART 3 TIMES IF NEEDED 25 tablet 0  . Polyethyl Glycol-Propyl Glycol (SYSTANE OP) Place 1 drop into both eyes daily as needed (burning eyes).     . Promethazine-Codeine 6.25-10 MG/5ML SOLN TAKE BY MOUTH EVERY 6 HOURS AS  NEEDED 240 mL 0  . vitamin B-12 (CYANOCOBALAMIN) 1000 MCG tablet Take 1,000 mcg by mouth daily.     Current Facility-Administered Medications  on File Prior to Visit  Medication Dose Route Frequency Provider Last Rate Last Admin  . sodium chloride flush (NS) 0.9 % injection 3 mL  3 mL Intravenous Q12H Duke Salvia, MD         Allergies  Allergen Reactions  . Ezetimibe Other (See Comments)    Pt was in coma for 6 days, numbness  . Penicillins Other (See Comments)    BLISTERS BETWEEN FINGERS Did it involve swelling of the face/tongue/throat, SOB, or low BP? No Did it involve sudden or severe rash/hives, skin peeling, or any reaction on the inside of your mouth or nose? Yes Did you need to seek medical attention at a hospital or doctor's office? No When did it last happen?60 years If all above answers are "NO", may proceed with cephalosporin use.   . Pravastatin Sodium Other (See Comments)    Aches and pains in joints  . Rosuvastatin Other (See Comments)    MYALGIAS  . Niacin Anxiety and Other (See Comments)    "Makes me feel nervous, jittery"      Review of Systems negative except from HPI and PMH  Physical Exam BP (!) 150/82 (BP Location: Left Arm, Patient Position: Sitting, Cuff Size: Normal)   Pulse 67   Ht 5\' 7"  (1.702 m)   Wt 145 lb (65.8 kg)   SpO2 96%   BMI 22.71 kg/m  Well developed and well nourished in no acute distress HENT normal Neck supple with JVP-flat Clear Device pocket well healed; without hematoma or erythema.  There is no tethering  Regular rate and rhythm, no  murmur Abd-soft with active BS No Clubbing cyanosis  edema Skin-warm and dry A & Oriented  Grossly normal sensory and motor function  ECG sinus @ 67 31/18/49 RBBB Freq PVCs-trigeminy        ASSESSMENT & PLAN:    Atrial fibrillation/flutter-persistent  PVCs-right bundle branch superior axis identified by the device with decreased ventricular pacing  Cardiomyopathy  recurrent  History of aborted sudden cardiac death    Implantable defibrillator-St. Jude   upgraded single-dual-chamber May 2018  The patient's device was interrogated.  The information was reviewed. No changes were made in the programming.     Ischemic cardiomyopathy with prior CABG    Congestive Heart Failure chronic systolic  High Risk Medication Surveillance,Dofetilide  Right bundle branch block.  Sinus bradycardia/chronotropic incompetence  No intercurrent atrial fibrillation or flutter  Will check surveillance Mg on dofetilide, ECG within range  With symptoms of ischemia--improved with PCP increase of metoprolol-  If persists would consider adding a CCB or ACE which might have some secondary benefit on his PVC burden  PVC burden still high, but asymptomatic and maybe 20 % better  BP has been elevated-As above   On Anticoagulation;  No bleeding issues   Euvolemic continue current meds         .  Sherryl Manges, MD  01/25/2020 10:34 AM     [facility] [address] [place] [address] [phone] (office) [phone] (fax) yes.

## 2020-01-26 LAB — CBC WITH DIFFERENTIAL/PLATELET
Basophils Absolute: 0 10*3/uL (ref 0.0–0.2)
Basos: 1 %
EOS (ABSOLUTE): 0.3 10*3/uL (ref 0.0–0.4)
Eos: 4 %
Hematocrit: 37.7 % (ref 37.5–51.0)
Hemoglobin: 12.7 g/dL — ABNORMAL LOW (ref 13.0–17.7)
Immature Grans (Abs): 0 10*3/uL (ref 0.0–0.1)
Immature Granulocytes: 1 %
Lymphocytes Absolute: 1.6 10*3/uL (ref 0.7–3.1)
Lymphs: 25 %
MCH: 31.4 pg (ref 26.6–33.0)
MCHC: 33.7 g/dL (ref 31.5–35.7)
MCV: 93 fL (ref 79–97)
Monocytes Absolute: 0.4 10*3/uL (ref 0.1–0.9)
Monocytes: 6 %
Neutrophils Absolute: 4.1 10*3/uL (ref 1.4–7.0)
Neutrophils: 63 %
Platelets: 95 10*3/uL — CL (ref 150–450)
RBC: 4.04 x10E6/uL — ABNORMAL LOW (ref 4.14–5.80)
RDW: 11.9 % (ref 11.6–15.4)
WBC: 6.3 10*3/uL (ref 3.4–10.8)

## 2020-01-26 LAB — MAGNESIUM: Magnesium: 2.1 mg/dL (ref 1.6–2.3)

## 2020-01-27 DIAGNOSIS — R972 Elevated prostate specific antigen [PSA]: Secondary | ICD-10-CM | POA: Diagnosis not present

## 2020-01-27 DIAGNOSIS — N401 Enlarged prostate with lower urinary tract symptoms: Secondary | ICD-10-CM | POA: Diagnosis not present

## 2020-02-03 LAB — CUP PACEART INCLINIC DEVICE CHECK
Battery Remaining Longevity: 64 mo
Brady Statistic RA Percent Paced: 13 %
Brady Statistic RV Percent Paced: 4.5 %
Date Time Interrogation Session: 20210302101500
HighPow Impedance: 73 Ohm
Implantable Lead Implant Date: 20131218
Implantable Lead Implant Date: 20180514
Implantable Lead Location: 753859
Implantable Lead Location: 753860
Implantable Lead Model: 5076
Implantable Pulse Generator Implant Date: 20180514
Lead Channel Impedance Value: 412.5 Ohm
Lead Channel Impedance Value: 475 Ohm
Lead Channel Pacing Threshold Amplitude: 0.5 V
Lead Channel Pacing Threshold Amplitude: 1.25 V
Lead Channel Pacing Threshold Pulse Width: 0.5 ms
Lead Channel Pacing Threshold Pulse Width: 0.5 ms
Lead Channel Sensing Intrinsic Amplitude: 4.3 mV
Lead Channel Sensing Intrinsic Amplitude: 4.3 mV
Lead Channel Setting Pacing Amplitude: 2 V
Lead Channel Setting Pacing Amplitude: 2.5 V
Lead Channel Setting Pacing Pulse Width: 0.5 ms
Lead Channel Setting Sensing Sensitivity: 0.5 mV
Pulse Gen Serial Number: 1245762

## 2020-02-29 ENCOUNTER — Other Ambulatory Visit: Payer: Self-pay | Admitting: *Deleted

## 2020-02-29 MED ORDER — DOFETILIDE 250 MCG PO CAPS: 250.0000 ug | ORAL_CAPSULE | Freq: Two times a day (BID) | ORAL | 5 refills | Status: DC

## 2020-03-06 ENCOUNTER — Other Ambulatory Visit: Payer: Self-pay | Admitting: Internal Medicine

## 2020-03-14 ENCOUNTER — Encounter: Payer: Self-pay | Admitting: Internal Medicine

## 2020-03-14 ENCOUNTER — Ambulatory Visit (INDEPENDENT_AMBULATORY_CARE_PROVIDER_SITE_OTHER): Payer: PPO | Admitting: Internal Medicine

## 2020-03-14 ENCOUNTER — Other Ambulatory Visit: Payer: Self-pay

## 2020-03-14 DIAGNOSIS — R634 Abnormal weight loss: Secondary | ICD-10-CM | POA: Diagnosis not present

## 2020-03-14 DIAGNOSIS — I255 Ischemic cardiomyopathy: Secondary | ICD-10-CM

## 2020-03-14 DIAGNOSIS — K7460 Unspecified cirrhosis of liver: Secondary | ICD-10-CM

## 2020-03-14 DIAGNOSIS — I4819 Other persistent atrial fibrillation: Secondary | ICD-10-CM | POA: Diagnosis not present

## 2020-03-14 DIAGNOSIS — F419 Anxiety disorder, unspecified: Secondary | ICD-10-CM

## 2020-03-14 LAB — BASIC METABOLIC PANEL
BUN: 17 mg/dL (ref 6–23)
CO2: 32 mEq/L (ref 19–32)
Calcium: 11.1 mg/dL — ABNORMAL HIGH (ref 8.4–10.5)
Chloride: 98 mEq/L (ref 96–112)
Creatinine, Ser: 1.1 mg/dL (ref 0.40–1.50)
GFR: 63.56 mL/min (ref >60.00)
Glucose, Bld: 101 mg/dL — ABNORMAL HIGH (ref 70–99)
Potassium: 4.5 mEq/L (ref 3.5–5.1)
Sodium: 132 mEq/L — ABNORMAL LOW (ref 135–145)

## 2020-03-14 LAB — HEPATIC FUNCTION PANEL
ALT: 12 U/L (ref 0–53)
AST: 22 U/L (ref 0–37)
Albumin: 4.1 g/dL (ref 3.5–5.2)
Alkaline Phosphatase: 87 U/L (ref 39–117)
Bilirubin, Direct: 0.1 mg/dL (ref 0.0–0.3)
Total Bilirubin: 0.5 mg/dL (ref 0.2–1.2)
Total Protein: 7.5 g/dL (ref 6.0–8.3)

## 2020-03-14 MED ORDER — PROMETHAZINE-CODEINE 6.25-10 MG/5ML PO SYRP: 5.0000 mL | ORAL_SOLUTION | Freq: Four times a day (QID) | ORAL | 0 refills | Status: DC | PRN

## 2020-03-14 MED ORDER — LOSARTAN POTASSIUM 50 MG PO TABS: 50.0000 mg | ORAL_TABLET | Freq: Every day | ORAL | 3 refills | Status: DC

## 2020-03-14 MED ORDER — NITROGLYCERIN 0.4 MG SL SUBL: SUBLINGUAL_TABLET | SUBLINGUAL | 3 refills | Status: DC

## 2020-03-14 NOTE — Assessment & Plan Note (Signed)
Sleeping well now ?

## 2020-03-14 NOTE — Assessment & Plan Note (Signed)
Eliquis Toprol

## 2020-03-14 NOTE — Assessment & Plan Note (Signed)
Wt Readings from Last 3 Encounters:  03/14/20 147 lb (66.7 kg)  01/25/20 145 lb (65.8 kg)  12/30/19 143 lb (64.9 kg)

## 2020-03-14 NOTE — Assessment & Plan Note (Signed)
LFTs 

## 2020-03-14 NOTE — Progress Notes (Signed)
Subjective:  Patient ID: [name] Christensen, male    DOB: 1932-04-21  Age: 86 y.o. MRN: [id number]  CC: No chief complaint on file.   HPI [name] Christensen presents for CAD, dyslipidemia, anticoagulation, HTN f/u Working 8 h/wk BP 150 at home  Outpatient Medications Prior to Visit  Medication Sig Dispense Refill  . acetaminophen (TYLENOL) 500 MG tablet Take 500 mg by mouth at bedtime.    [name] alfuzosin (UROXATRAL) 10 MG 24 hr tablet Take 10 mg by mouth every evening.     [name] atorvastatin (LIPITOR) 20 MG tablet Take 1 tablet (20 mg total) by mouth daily. 90 tablet 3  . cholecalciferol (VITAMIN D3) 10 MCG (400 UNIT) TABS tablet Take 1 tablet (400 Units total) by mouth daily. 30 tablet 11  . dofetilide (TIKOSYN) 250 MCG capsule Take 1 capsule (250 mcg total) by mouth 2 (two) times daily. 60 capsule 5  . dutasteride (AVODART) 0.5 MG capsule Take 0.5 mg by mouth every evening.     [name] ELIQUIS 5 MG TABS tablet TAKE 1 TABLET BY MOUTH TWICE (2) DAILY 180 tablet 3  . esomeprazole (NEXIUM) 20 MG capsule Take 1 capsule (20 mg total) by mouth daily at 12 noon. 30 capsule 3  . LINZESS 290 MCG CAPS capsule TAKE 1 CAPSULE BY MOUTH DAILY AS NEEDED (Patient taking differently: Take 290 mcg by mouth daily as needed (constipation). ) 30 capsule 11  . loratadine (CLARITIN) 10 MG tablet Take 1 tablet (10 mg total) by mouth daily. 30 tablet 11  . magnesium oxide (MAG-OX) 400 (241.3 Mg) MG tablet TAKE 1 TABLET BY MOUTH ONCE DAILY 30 tablet 6  . metoprolol succinate (TOPROL-XL) 50 MG 24 hr tablet Take 1 tablet (50 mg total) by mouth 2 (two) times daily. Take with or immediately following a meal. 180 tablet 3  . Multiple Vitamin (MULTIVITAMIN WITH MINERALS) TABS tablet Take 1 tablet by mouth every other day.    . nitroGLYCERIN (NITROSTAT) 0.4 MG SL tablet DISSOLVE 1 TABLET UNDER TONGUE AS NEEDEDFOR CHEST PAIN. MAY REPEAT 5 MINUTES APART 3 TIMES IF NEEDED 25 tablet 0  . Polyethyl Glycol-Propyl Glycol (SYSTANE OP) Place 1 drop  into both eyes daily as needed (burning eyes).     . Promethazine-Codeine 6.25-10 MG/5ML SOLN TAKE 5ML BY MOUTH EVERY 6 HOURS AS NEEDED 240 mL 0  . vitamin B-12 (CYANOCOBALAMIN) 1000 MCG tablet Take 1,000 mcg by mouth daily.     Facility-Administered Medications Prior to Visit  Medication Dose Route Frequency Provider Last Rate Last Admin  . sodium chloride flush (NS) 0.9 % injection 3 mL  3 mL Intravenous Q12H Deboraha Sprang, MD        ROS: Review of Systems  Constitutional: Negative for appetite change, fatigue and unexpected weight change.  HENT: Negative for congestion, nosebleeds, sneezing, sore throat and trouble swallowing.   Eyes: Negative for itching and visual disturbance.  Respiratory: Negative for cough.   Cardiovascular: Negative for chest pain, palpitations and leg swelling.  Gastrointestinal: Negative for abdominal distention, blood in stool, diarrhea and nausea.  Genitourinary: Negative for frequency and hematuria.  Musculoskeletal: Negative for arthralgias, back pain, gait problem, joint swelling and neck pain.  Skin: Negative for rash.  Neurological: Negative for dizziness, tremors, speech difficulty and weakness.  Psychiatric/Behavioral: Negative for agitation, dysphoric mood and sleep disturbance. The patient is not nervous/anxious.     Objective:  BP (!) 162/82 (BP Location: Left Arm, Patient Position: Sitting, Cuff Size: Normal)  Pulse 81   Temp 98.3 F (36.8 C) (Oral)   Ht 5\' 7"  (1.702 m)   Wt 147 lb (66.7 kg)   SpO2 96%   BMI 23.02 kg/m   BP Readings from Last 3 Encounters:  03/14/20 (!) 162/82  01/25/20 (!) 150/82  12/30/19 (!) 166/72    Wt Readings from Last 3 Encounters:  03/14/20 147 lb (66.7 kg)  01/25/20 145 lb (65.8 kg)  12/30/19 143 lb (64.9 kg)    Physical Exam Constitutional:      General: He is not in acute distress.    Appearance: He is well-developed.     Comments: NAD  Eyes:     Conjunctiva/sclera: Conjunctivae normal.      Pupils: Pupils are equal, round, and reactive to light.  Neck:     Thyroid: No thyromegaly.     Vascular: No JVD.  Cardiovascular:     Rate and Rhythm: Normal rate and regular rhythm.     Heart sounds: Normal heart sounds. No murmur. No friction rub. No gallop.   Pulmonary:     Effort: Pulmonary effort is normal. No respiratory distress.     Breath sounds: Normal breath sounds. No wheezing or rales.  Chest:     Chest wall: No tenderness.  Abdominal:     General: Bowel sounds are normal. There is no distension.     Palpations: Abdomen is soft. There is no mass.     Tenderness: There is no abdominal tenderness. There is no guarding or rebound.  Musculoskeletal:        General: No tenderness. Normal range of motion.     Cervical back: Normal range of motion.  Lymphadenopathy:     Cervical: No cervical adenopathy.  Skin:    General: Skin is warm and dry.     Findings: No rash.  Neurological:     Mental Status: He is alert and oriented to person, place, and time.     Cranial Nerves: No cranial nerve deficit.     Motor: No abnormal muscle tone.     Coordination: Coordination normal.     Gait: Gait normal.     Deep Tendon Reflexes: Reflexes are normal and symmetric.  Psychiatric:        Behavior: Behavior normal.        Thought Content: Thought content normal.        Judgment: Judgment normal.     Lab Results  Component Value Date   WBC 6.3 01/25/2020   HGB 12.7 (L) 01/25/2020   HCT 37.7 01/25/2020   PLT 95 (LL) 01/25/2020   GLUCOSE 91 12/30/2019   CHOL 107 12/30/2019   TRIG 102.0 12/30/2019   HDL 46.80 12/30/2019   LDLCALC 39 12/30/2019   ALT 14 12/30/2019   AST 23 12/30/2019   NA 134 (L) 12/30/2019   K 4.0 12/30/2019   CL 100 12/30/2019   CREATININE 1.06 12/30/2019   BUN 18 12/30/2019   CO2 31 12/30/2019   TSH 2.51 12/30/2019   PSA 12.45 (H) 02/06/2016   INR 1.1 04/02/2017   HGBA1C 6.1 12/30/2019    No results found.  Assessment & Plan:    Walker Kehr, MD

## 2020-03-14 NOTE — Addendum Note (Signed)
Addended by: Trenda Moots on: [id number] 02:56 PM   Modules accepted: Orders

## 2020-03-16 ENCOUNTER — Other Ambulatory Visit: Payer: PPO

## 2020-03-16 ENCOUNTER — Other Ambulatory Visit: Payer: Self-pay

## 2020-03-17 DIAGNOSIS — H43813 Vitreous degeneration, bilateral: Secondary | ICD-10-CM | POA: Diagnosis not present

## 2020-03-20 LAB — PTH, INTACT AND CALCIUM
Calcium: 10.9 mg/dL — ABNORMAL HIGH (ref 8.6–10.3)
PTH: 30 pg/mL (ref 14–64)

## 2020-04-05 ENCOUNTER — Ambulatory Visit (INDEPENDENT_AMBULATORY_CARE_PROVIDER_SITE_OTHER): Payer: PPO | Admitting: *Deleted

## 2020-04-05 ENCOUNTER — Telehealth: Payer: Self-pay

## 2020-04-05 DIAGNOSIS — I495 Sick sinus syndrome: Secondary | ICD-10-CM

## 2020-04-05 DIAGNOSIS — I469 Cardiac arrest, cause unspecified: Secondary | ICD-10-CM | POA: Diagnosis not present

## 2020-04-05 NOTE — Telephone Encounter (Signed)
Left message for patient to remind of missed remote transmission.  

## 2020-04-05 NOTE — Telephone Encounter (Signed)
The pt states [facility] is sending him a new monitor in 20 days. He wanted a in office appointment. I scheduled him with the device clinic appointment but if he receive his monitor before his appointment he is going to send a transmission and cancel the appointment. The pt verbalized understanding and thanked me for helping him.

## 2020-04-10 ENCOUNTER — Telehealth: Payer: Self-pay | Admitting: Emergency Medicine

## 2020-04-10 LAB — CUP PACEART REMOTE DEVICE CHECK
Battery Remaining Longevity: 59 mo
Battery Remaining Percentage: 61 %
Battery Voltage: 2.95 V
Brady Statistic AP VP Percent: 9.5 %
Brady Statistic AP VS Percent: 23 %
Brady Statistic AS VP Percent: 6.3 %
Brady Statistic AS VS Percent: 48 %
Brady Statistic RA Percent Paced: 20 %
Brady Statistic RV Percent Paced: 16 %
Date Time Interrogation Session: 20210512020016
HighPow Impedance: 72 Ohm
HighPow Impedance: 72 Ohm
Implantable Lead Implant Date: 20131218
Implantable Lead Implant Date: 20180514
Implantable Lead Location: 753859
Implantable Lead Location: 753860
Implantable Lead Model: 5076
Implantable Pulse Generator Implant Date: 20180514
Lead Channel Impedance Value: 400 Ohm
Lead Channel Impedance Value: 480 Ohm
Lead Channel Pacing Threshold Amplitude: 0.5 V
Lead Channel Pacing Threshold Amplitude: 1.25 V
Lead Channel Pacing Threshold Pulse Width: 0.5 ms
Lead Channel Pacing Threshold Pulse Width: 0.5 ms
Lead Channel Sensing Intrinsic Amplitude: 3.4 mV
Lead Channel Sensing Intrinsic Amplitude: 3.7 mV
Lead Channel Setting Pacing Amplitude: 2 V
Lead Channel Setting Pacing Amplitude: 2.5 V
Lead Channel Setting Pacing Pulse Width: 0.5 ms
Lead Channel Setting Sensing Sensitivity: 0.5 mV
Pulse Gen Serial Number: 1245762

## 2020-04-10 NOTE — Telephone Encounter (Signed)
Patient received new remote monitor. Will send remote transmission. After remote transmission received will cancel 04/25/20 device clinic appointment.

## 2020-04-10 NOTE — Progress Notes (Signed)
Remote ICD transmission.   

## 2020-04-11 NOTE — Telephone Encounter (Signed)
Transmission received, data received appears WNL. Patients device clinic apt. canceled. Patient called and made aware. Advised patient to call DC back if he has any further questions or concerns, verbalizes understanding.

## 2020-05-11 ENCOUNTER — Other Ambulatory Visit: Payer: Self-pay

## 2020-05-11 ENCOUNTER — Ambulatory Visit: Payer: PPO | Admitting: Dermatology

## 2020-05-11 ENCOUNTER — Encounter: Payer: Self-pay | Admitting: Dermatology

## 2020-05-11 DIAGNOSIS — S40862A Insect bite (nonvenomous) of left upper arm, initial encounter: Secondary | ICD-10-CM | POA: Diagnosis not present

## 2020-05-11 DIAGNOSIS — W57XXXA Bitten or stung by nonvenomous insect and other nonvenomous arthropods, initial encounter: Secondary | ICD-10-CM | POA: Diagnosis not present

## 2020-05-11 DIAGNOSIS — L905 Scar conditions and fibrosis of skin: Secondary | ICD-10-CM

## 2020-05-11 DIAGNOSIS — S80862A Insect bite (nonvenomous), left lower leg, initial encounter: Secondary | ICD-10-CM

## 2020-05-11 DIAGNOSIS — S80861A Insect bite (nonvenomous), right lower leg, initial encounter: Secondary | ICD-10-CM | POA: Diagnosis not present

## 2020-05-11 DIAGNOSIS — D229 Melanocytic nevi, unspecified: Secondary | ICD-10-CM | POA: Diagnosis not present

## 2020-05-11 DIAGNOSIS — L820 Inflamed seborrheic keratosis: Secondary | ICD-10-CM | POA: Diagnosis not present

## 2020-05-11 DIAGNOSIS — D1801 Hemangioma of skin and subcutaneous tissue: Secondary | ICD-10-CM

## 2020-05-11 DIAGNOSIS — L578 Other skin changes due to chronic exposure to nonionizing radiation: Secondary | ICD-10-CM | POA: Diagnosis not present

## 2020-05-11 DIAGNOSIS — L570 Actinic keratosis: Secondary | ICD-10-CM

## 2020-05-11 DIAGNOSIS — L814 Other melanin hyperpigmentation: Secondary | ICD-10-CM

## 2020-05-11 DIAGNOSIS — L821 Other seborrheic keratosis: Secondary | ICD-10-CM | POA: Diagnosis not present

## 2020-05-11 DIAGNOSIS — Z1283 Encounter for screening for malignant neoplasm of skin: Secondary | ICD-10-CM

## 2020-05-11 MED ORDER — TRIAMCINOLONE ACETONIDE 0.1 % EX CREA: 1.0000 "application " | TOPICAL_CREAM | Freq: Two times a day (BID) | CUTANEOUS | 0 refills | Status: DC | PRN

## 2020-05-11 NOTE — Progress Notes (Signed)
Follow-Up Visit   Subjective  [name] Christensen is a 86 y.o. male who presents for the following: Annual Exam (UBSE - patient is especially concerned about a lesion on his nose that hasn't healed in the past 5-6 months). The patient presents for Upper Body Skin Exam (UBSE) for skin cancer screening and mole check.  The following portions of the chart were reviewed this encounter and updated as appropriate:  Tobacco  Allergies  Meds  Problems  Med Hx  Surg Hx  Fam Hx     Review of Systems:  No other skin or systemic complaints except as noted in HPI or Assessment and Plan.  Objective  Well appearing patient in no apparent distress; mood and affect are within normal limits.  All skin waist up examined.  Objective  Left Upper Back: Dyspigmented smooth macule or patch.   Objective  arms and legs: Papular urticaria  Objective  L nasal tip, R preauricular, L preauricular (3): Erythematous thin papules/macules with gritty scale.   Objective  L chest, R chest, R arm x 2, L thigh, low back (6): Erythematous keratotic or waxy stuck-on papule or plaque.   Assessment & Plan    Scar Left Upper Back  S/P shingles - Benign, observe.    Insect bite of left upper arm with local reaction, initial encounter arms and legs  Start TMC 0.1% cream BID PRN. Avoid f/g/a.   triamcinolone cream (KENALOG) 0.1 % - arms and legs  AK (actinic keratosis) (3) L nasal tip, R preauricular, L preauricular  Destruction of lesion - L nasal tip, R preauricular, L preauricular Complexity: simple   Destruction method: cryotherapy   Informed consent: discussed and consent obtained   Timeout:  patient name, date of birth, surgical site, and procedure verified Lesion destroyed using liquid nitrogen: Yes   Region frozen until ice ball extended beyond lesion: Yes   Outcome: patient tolerated procedure well with no complications   Post-procedure details: wound care instructions given    Inflamed  seborrheic keratosis (6) L chest, R chest, R arm x 2, L thigh, low back  Destruction of lesion - L chest, R chest, R arm x 2, L thigh, low back Complexity: simple   Destruction method: cryotherapy   Informed consent: discussed and consent obtained   Timeout:  patient name, date of birth, surgical site, and procedure verified Lesion destroyed using liquid nitrogen: Yes   Region frozen until ice ball extended beyond lesion: Yes   Outcome: patient tolerated procedure well with no complications   Post-procedure details: wound care instructions given    Skin cancer screening   Lentigines - Scattered tan macules - Discussed due to sun exposure - Benign, observe - Call for any changes  Seborrheic Keratoses - Stuck-on, waxy, tan-brown papules and plaques  - Discussed benign etiology and prognosis. - Observe - Call for any changes  Melanocytic Nevi - Tan-brown and/or pink-flesh-colored symmetric macules and papules - Benign appearing on exam today - Observation - Call clinic for new or changing moles - Recommend daily use of broad spectrum spf 30+ sunscreen to sun-exposed areas.   Hemangiomas - Red papules - Discussed benign nature - Observe - Call for any changes  Actinic Damage - diffuse scaly erythematous macules with underlying dyspigmentation - Recommend daily broad spectrum sunscreen SPF 30+ to sun-exposed areas, reapply every 2 hours as needed.  - Call for new or changing lesions.  Skin cancer screening performed today.  Return in about 2 months (around 07/11/2020).  Luther Redo, CMA, am acting as scribe for Sarina Ser, MD .  Documentation: I have reviewed the above documentation for accuracy and completeness, and I agree with the above.  Sarina Ser, MD

## 2020-05-14 ENCOUNTER — Encounter: Payer: Self-pay | Admitting: Dermatology

## 2020-06-21 ENCOUNTER — Ambulatory Visit (INDEPENDENT_AMBULATORY_CARE_PROVIDER_SITE_OTHER): Payer: PPO | Admitting: Internal Medicine

## 2020-06-21 ENCOUNTER — Other Ambulatory Visit: Payer: Self-pay

## 2020-06-21 ENCOUNTER — Encounter: Payer: Self-pay | Admitting: Internal Medicine

## 2020-06-21 VITALS — BP 140/58 | HR 61 | Temp 97.9°F | Ht 67.0 in | Wt 151.0 lb

## 2020-06-21 DIAGNOSIS — I5032 Chronic diastolic (congestive) heart failure: Secondary | ICD-10-CM | POA: Diagnosis not present

## 2020-06-21 DIAGNOSIS — Z95810 Presence of automatic (implantable) cardiac defibrillator: Secondary | ICD-10-CM

## 2020-06-21 DIAGNOSIS — N401 Enlarged prostate with lower urinary tract symptoms: Secondary | ICD-10-CM

## 2020-06-21 DIAGNOSIS — R350 Frequency of micturition: Secondary | ICD-10-CM | POA: Diagnosis not present

## 2020-06-21 DIAGNOSIS — F419 Anxiety disorder, unspecified: Secondary | ICD-10-CM

## 2020-06-21 DIAGNOSIS — I2510 Atherosclerotic heart disease of native coronary artery without angina pectoris: Secondary | ICD-10-CM

## 2020-06-21 DIAGNOSIS — I4892 Unspecified atrial flutter: Secondary | ICD-10-CM

## 2020-06-21 DIAGNOSIS — I10 Essential (primary) hypertension: Secondary | ICD-10-CM | POA: Diagnosis not present

## 2020-06-21 LAB — COMPLETE METABOLIC PANEL WITH GFR
AG Ratio: 1.3 (calc) (ref 1.0–2.5)
ALT: 11 U/L (ref 9–46)
AST: 23 U/L (ref 10–35)
Albumin: 4 g/dL (ref 3.6–5.1)
Alkaline phosphatase (APISO): 88 U/L (ref 35–144)
BUN/Creatinine Ratio: 15 (calc) (ref 6–22)
BUN: 18 mg/dL (ref 7–25)
CO2: 29 mmol/L (ref 20–32)
Calcium: 10.7 mg/dL — ABNORMAL HIGH (ref 8.6–10.3)
Chloride: 99 mmol/L (ref 98–110)
Creat: 1.19 mg/dL — ABNORMAL HIGH (ref 0.70–1.11)
GFR, Est African American: 64 mL/min/{1.73_m2} (ref >=60)
GFR, Est Non African American: 55 mL/min/{1.73_m2} — ABNORMAL LOW (ref >=60)
Globulin: 3 g/dL (calc) (ref 1.9–3.7)
Glucose, Bld: 83 mg/dL (ref 65–99)
Potassium: 4.8 mmol/L (ref 3.5–5.3)
Sodium: 131 mmol/L — ABNORMAL LOW (ref 135–146)
Total Bilirubin: 0.6 mg/dL (ref 0.2–1.2)
Total Protein: 7 g/dL (ref 6.1–8.1)

## 2020-06-21 NOTE — Assessment & Plan Note (Signed)
Eliquis 

## 2020-06-21 NOTE — Assessment & Plan Note (Signed)
Insomnia

## 2020-06-21 NOTE — Assessment & Plan Note (Signed)
Atenolol, ASA, Plavix, Lipitor, Losartan

## 2020-06-21 NOTE — Assessment & Plan Note (Signed)
Nocturia 3-4 per night

## 2020-06-21 NOTE — Assessment & Plan Note (Signed)
F/u w/Cardiology 

## 2020-06-21 NOTE — Addendum Note (Signed)
Addended by: Cresenciano Lick on: 06/21/2020 09:47 AM   Modules accepted: Orders

## 2020-06-21 NOTE — Assessment & Plan Note (Signed)
Probable pacemaker cardiomyopathy

## 2020-06-21 NOTE — Progress Notes (Signed)
Subjective:  Patient ID: [name] Christensen, male    DOB: 1931-12-01  Age: 86 y.o. MRN: [id number]  CC: No chief complaint on file.   HPI [name] Christensen presents for CAD, BPH, HTN, CAD f/u  Outpatient Medications Prior to Visit  Medication Sig Dispense Refill  . acetaminophen (TYLENOL) 500 MG tablet Take 500 mg by mouth at bedtime.    [name] alfuzosin (UROXATRAL) 10 MG 24 hr tablet Take 10 mg by mouth every evening.     [name] atorvastatin (LIPITOR) 20 MG tablet Take 1 tablet (20 mg total) by mouth daily. 90 tablet 3  . cholecalciferol (VITAMIN D3) 10 MCG (400 UNIT) TABS tablet Take 1 tablet (400 Units total) by mouth daily. 30 tablet 11  . dofetilide (TIKOSYN) 250 MCG capsule Take 1 capsule (250 mcg total) by mouth 2 (two) times daily. 60 capsule 5  . dutasteride (AVODART) 0.5 MG capsule Take 0.5 mg by mouth every evening.     [name] ELIQUIS 5 MG TABS tablet TAKE 1 TABLET BY MOUTH TWICE (2) DAILY 180 tablet 3  . esomeprazole (NEXIUM) 20 MG capsule Take 1 capsule (20 mg total) by mouth daily at 12 noon. 30 capsule 3  . LINZESS 290 MCG CAPS capsule TAKE 1 CAPSULE BY MOUTH DAILY AS NEEDED (Patient taking differently: Take 290 mcg by mouth daily as needed (constipation). ) 30 capsule 11  . loratadine (CLARITIN) 10 MG tablet Take 1 tablet (10 mg total) by mouth daily. 30 tablet 11  . losartan (COZAAR) 50 MG tablet Take 1 tablet (50 mg total) by mouth daily. 90 tablet 3  . magnesium oxide (MAG-OX) 400 (241.3 Mg) MG tablet TAKE 1 TABLET BY MOUTH ONCE DAILY 30 tablet 6  . metoprolol succinate (TOPROL-XL) 50 MG 24 hr tablet Take 1 tablet (50 mg total) by mouth 2 (two) times daily. Take with or immediately following a meal. 180 tablet 3  . Multiple Vitamin (MULTIVITAMIN WITH MINERALS) TABS tablet Take 1 tablet by mouth every other day.    . nitroGLYCERIN (NITROSTAT) 0.4 MG SL tablet As directed SL 25 tablet 3  . Polyethyl Glycol-Propyl Glycol (SYSTANE OP) Place 1 drop into both eyes daily as needed (burning eyes).      . promethazine-codeine (PHENERGAN WITH CODEINE) 6.25-10 MG/5ML syrup Take 5 mLs by mouth every 6 (six) hours as needed. 300 mL 0  . triamcinolone cream (KENALOG) 0.1 % Apply 1 application topically 2 (two) times daily as needed. Avoid f/g/a 80 g 0  . vitamin B-12 (CYANOCOBALAMIN) 1000 MCG tablet Take 1,000 mcg by mouth daily.     Facility-Administered Medications Prior to Visit  Medication Dose Route Frequency Provider Last Rate Last Admin  . sodium chloride flush (NS) 0.9 % injection 3 mL  3 mL Intravenous Q12H Deboraha Sprang, MD        ROS: Review of Systems  Constitutional: Negative for appetite change, fatigue and unexpected weight change.  HENT: Negative for congestion, nosebleeds, sneezing, sore throat and trouble swallowing.   Eyes: Negative for itching and visual disturbance.  Respiratory: Negative for cough.   Cardiovascular: Negative for chest pain, palpitations and leg swelling.  Gastrointestinal: Negative for abdominal distention, blood in stool, diarrhea and nausea.  Genitourinary: Negative for frequency and hematuria.  Musculoskeletal: Negative for back pain, gait problem, joint swelling and neck pain.  Skin: Negative for rash.  Neurological: Negative for dizziness, tremors, speech difficulty and weakness.  Psychiatric/Behavioral: Negative for agitation, dysphoric mood and sleep disturbance. The patient is  nervous/anxious.     Objective:  BP (!) 140/58 (BP Location: Right Arm, Patient Position: Sitting, Cuff Size: Normal)   Pulse 61   Temp 97.9 F (36.6 C) (Oral)   Ht 5\' 7"  (1.702 m)   Wt 151 lb (68.5 kg)   SpO2 96%   BMI 23.65 kg/m   BP Readings from Last 3 Encounters:  06/21/20 (!) 140/58  03/14/20 (!) 162/82  01/25/20 (!) 150/82    Wt Readings from Last 3 Encounters:  06/21/20 151 lb (68.5 kg)  03/14/20 147 lb (66.7 kg)  01/25/20 145 lb (65.8 kg)    Physical Exam Constitutional:      General: He is not in acute distress.    Appearance: He is  well-developed.     Comments: NAD  Eyes:     Conjunctiva/sclera: Conjunctivae normal.     Pupils: Pupils are equal, round, and reactive to light.  Neck:     Thyroid: No thyromegaly.     Vascular: No JVD.  Cardiovascular:     Rate and Rhythm: Bradycardia present. Rhythm irregular.     Heart sounds: Normal heart sounds. No murmur heard.  No friction rub. No gallop.   Pulmonary:     Effort: Pulmonary effort is normal. No respiratory distress.     Breath sounds: Normal breath sounds. No wheezing or rales.  Chest:     Chest wall: No tenderness.  Abdominal:     General: Bowel sounds are normal. There is no distension.     Palpations: Abdomen is soft. There is no mass.     Tenderness: There is no abdominal tenderness. There is no guarding or rebound.  Musculoskeletal:        General: No tenderness. Normal range of motion.     Cervical back: Normal range of motion.  Lymphadenopathy:     Cervical: No cervical adenopathy.  Skin:    General: Skin is warm and dry.     Findings: No rash.  Neurological:     Mental Status: He is alert and oriented to person, place, and time.     Cranial Nerves: No cranial nerve deficit.     Motor: No abnormal muscle tone.     Coordination: Coordination normal.     Gait: Gait normal.     Deep Tendon Reflexes: Reflexes are normal and symmetric.  Psychiatric:        Behavior: Behavior normal.        Thought Content: Thought content normal.        Judgment: Judgment normal.     Lab Results  Component Value Date   WBC 6.3 01/25/2020   HGB 12.7 (L) 01/25/2020   HCT 37.7 01/25/2020   PLT 95 (LL) 01/25/2020   GLUCOSE 101 (H) 03/14/2020   CHOL 107 12/30/2019   TRIG 102.0 12/30/2019   HDL 46.80 12/30/2019   LDLCALC 39 12/30/2019   ALT 12 03/14/2020   AST 22 03/14/2020   NA 132 (L) 03/14/2020   K 4.5 03/14/2020   CL 98 03/14/2020   CREATININE 1.10 03/14/2020   BUN 17 03/14/2020   CO2 32 03/14/2020   TSH 2.51 12/30/2019   PSA 12.45 (H) 02/06/2016     INR 1.1 04/02/2017   HGBA1C 6.1 12/30/2019    No results found.  Assessment & Plan:   There are no diagnoses linked to this encounter.   No orders of the defined types were placed in this encounter.    Follow-up: No follow-ups on file.  Alex  Deloma Spindle, MD

## 2020-06-21 NOTE — Assessment & Plan Note (Signed)
BP Readings from Last 3 Encounters:  06/21/20 (!) 140/58  03/14/20 (!) 162/82  01/25/20 (!) 150/82

## 2020-06-26 ENCOUNTER — Ambulatory Visit: Payer: Self-pay

## 2020-06-26 ENCOUNTER — Telehealth: Payer: Self-pay | Admitting: Internal Medicine

## 2020-06-26 NOTE — Telephone Encounter (Signed)
Spoke with pt who states he had a possible covid exposure this am.  Pt is fully vaccinated but would like a baseline test.  Pt advised to contact his PCP for further advice re: CDC guidance for testing.  Pt advised he should be able to receive testing at a local pharmacy in his area and can goggle that information.  Pt denies any symptoms at this time.  Pt verbalizes understanding and agrees with current plan.

## 2020-06-26 NOTE — Telephone Encounter (Signed)
Can you follow up with the patient?

## 2020-06-26 NOTE — Telephone Encounter (Signed)
Patient states he thinks he was exposed to covid this am, and is wondering where he can go to get tested. Please advise.

## 2020-06-26 NOTE — Telephone Encounter (Signed)
Pt is a pt of Therapist, music at [facility]. [facility] does not triage for this practice. Routing to that office.

## 2020-06-28 NOTE — Telephone Encounter (Signed)
Pt called to investigate reason for seeking advice.  Pt states he believes he was exposed to [place] by his secretary whose husband tested positive on Monday 8/2.  Pt denies COVID sympotoms of SOB, fever, cough, sore throat & is fully vaccinated. He wants to know if he should be tested.  Pt advised to wait until Fri or Sat to be tested & observe for symptoms of COVID during the next 10days.  Pt verb understanding.

## 2020-06-29 ENCOUNTER — Other Ambulatory Visit (HOSPITAL_COMMUNITY): Payer: Self-pay | Admitting: Physician Assistant

## 2020-07-03 ENCOUNTER — Telehealth: Payer: Self-pay | Admitting: Internal Medicine

## 2020-07-03 NOTE — Telephone Encounter (Signed)
Pt c/o of Chest Pain: STAT if CP now or developed within 24 hours  1. Are you having CP right now? no  2. Are you experiencing any other symptoms (ex. SOB, nausea, vomiting, sweating)? No other symptoms with the chest pain  3. How long have you been experiencing CP? Two weeks   4. Is your CP continuous or coming and going? Comes and goes   5. Have you taken Nitroglycerin? Yes, occasionally.  ? Patient would like to speak to nurse, he states he has been on the cancellation list for 3 wks and has not been called.  He would like to be seen tomorrow when Dr. [name] is in the [place] office.

## 2020-07-03 NOTE — Telephone Encounter (Signed)
Spoke with the patient. Patient sts that he has been taking Nitro intermittently for CP for several  weeks. Patient sts that this morning he developed right sided CP and took a Nitro x1 with no relief. Adv the patient that right sided CP is not usually associated with cardiac symptoms and may be why his Nitro was not effective.  Patient currently is asymptomatic. He does not feel as though he needs to go to the ED. He would like to be seen by Dr. [name] asap. Adv the patient that I did not see any availability this week. Adv the patent that Dr. Olin Pia nurse Marsh Dolly., RN is out of the office today and will return tomorrow.  Adv the patient that I will fwd the message to Nira Conn to see if a work in appt with Dr. [name] could be scheduled. Adv the patient to go to the ED in the interim if his symptoms worsen.  Patient verbalized understanding and voiced appreciation for the assistance.

## 2020-07-04 NOTE — Telephone Encounter (Signed)
Scheduling,   Do you mind calling him and offering him an appointment with an APP for his chest pain symptoms. They are able to work up this issue.  I really don't have any work in spots until 8/26 at 8:40 am.   Thanks!

## 2020-07-04 NOTE — Telephone Encounter (Signed)
Scheduled 8/16 Walker at 830

## 2020-07-05 ENCOUNTER — Ambulatory Visit (INDEPENDENT_AMBULATORY_CARE_PROVIDER_SITE_OTHER): Payer: PPO | Admitting: *Deleted

## 2020-07-05 DIAGNOSIS — I255 Ischemic cardiomyopathy: Secondary | ICD-10-CM

## 2020-07-05 DIAGNOSIS — M25511 Pain in right shoulder: Secondary | ICD-10-CM | POA: Diagnosis not present

## 2020-07-05 LAB — CUP PACEART REMOTE DEVICE CHECK
Battery Remaining Longevity: 56 mo
Battery Remaining Percentage: 58 %
Battery Voltage: 2.95 V
Brady Statistic AP VP Percent: 10 %
Brady Statistic AP VS Percent: 21 %
Brady Statistic AS VP Percent: 6.1 %
Brady Statistic AS VS Percent: 49 %
Brady Statistic RA Percent Paced: 19 %
Brady Statistic RV Percent Paced: 16 %
Date Time Interrogation Session: 20210811020017
HighPow Impedance: 73 Ohm
HighPow Impedance: 73 Ohm
Implantable Lead Implant Date: 20131218
Implantable Lead Implant Date: 20180514
Implantable Lead Location: 753859
Implantable Lead Location: 753860
Implantable Lead Model: 5076
Implantable Pulse Generator Implant Date: 20180514
Lead Channel Impedance Value: 390 Ohm
Lead Channel Impedance Value: 460 Ohm
Lead Channel Pacing Threshold Amplitude: 0.5 V
Lead Channel Pacing Threshold Amplitude: 1.25 V
Lead Channel Pacing Threshold Pulse Width: 0.5 ms
Lead Channel Pacing Threshold Pulse Width: 0.5 ms
Lead Channel Sensing Intrinsic Amplitude: 3.6 mV
Lead Channel Sensing Intrinsic Amplitude: 3.8 mV
Lead Channel Setting Pacing Amplitude: 2 V
Lead Channel Setting Pacing Amplitude: 2.5 V
Lead Channel Setting Pacing Pulse Width: 0.5 ms
Lead Channel Setting Sensing Sensitivity: 0.5 mV
Pulse Gen Serial Number: 1245762

## 2020-07-09 NOTE — Progress Notes (Signed)
Office Visit    Patient Name: [name] Christensen Date of Encounter: 07/10/2020  Primary Care Provider:  Tresa Garter, MD Primary Cardiologist:  Charlton Haws, MD Electrophysiologist:  Sherryl Manges, MD   Chief Complaint    [name] Christensen is a 86 y.o. male with a hx of CAD s/p CABG 2005, ischemic cardiomyopathy with progressive LV dysfunction due to frequent PVC s/p AICD, chronic combined systolic and diastolic heart failure, atrial fibrillation on dofetilide, RBBB presents today for chest pain.   Past Medical History    Past Medical History:  Diagnosis Date  . Actinic keratosis 06/09/2019   L sup forehead at hairline - bx proven  . AICD (automatic cardioverter/defibrillator) present 04/07/2017  . Allergy   . Anxiety   . BPH (benign prostatic hypertrophy)    elevated PSA Dr. Lindell Noe Bx 2010  . Cardiac arrest [facility])    a. out-of-hospital arrest 11/18/2012 - EF 40-45%, patent grafts on cath, received [employer] AICD.  [name] Carotid disease, bilateral (HCC)    a. 0-39% by doppers.  . Cataract    bil cataracts removed  . CHF (congestive heart failure) (HCC)   . Coronary artery disease    a. s/p MI/CABG 2005. b. s/p cath at time of VF arrest 10/2013 - grafts patent.  . Cough   . Diverticulosis   . Elevated LFTs    a. 10/2012 felt due to cardiac arrest - Hepatitis C Ab reactive from 11/21/2012>>Hep C RNA PCR negative 11/17/2012.   [name] GERD (gastroesophageal reflux disease)   . Hyperlipidemia   . Hypertension   . Internal hemorrhoids   . Myocardial infarction (HCC)    2005 - CABG x 5 2005  . RBBB   . Tubular adenoma of colon   . Ventricular bigeminy    a. Event monitor 01/2013: NSR with PVCs and occ bigeminy.   Past Surgical History:  Procedure Laterality Date  . APPENDECTOMY    . CARDIAC CATHETERIZATION  2007   with patent graft anatomy atretic left internal mammary  artery to the LAD which is nonobstructive. Will restart study June 08, 2007  . CARDIOVERSION N/A 03/27/2018    Procedure: CARDIOVERSION;  Surgeon: Iran Ouch, MD;  Location: [facility] ORS;  Service: Cardiovascular;  Laterality: N/A;  . CARDIOVERSION N/A 01/04/2019   Procedure: CARDIOVERSION (CATH LAB);  Surgeon: Iran Ouch, MD;  Location: [facility] ORS;  Service: Cardiovascular;  Laterality: N/A;  . CARDIOVERSION N/A 07/07/2019   Procedure: CARDIOVERSION;  Surgeon: Wendall Stade, MD;  Location: [facility] ENDOSCOPY;  Service: Cardiovascular;  Laterality: N/A;  . CATARACT EXTRACTION W/PHACO Right 04/23/2016   Procedure: CATARACT EXTRACTION PHACO AND INTRAOCULAR LENS PLACEMENT (IOC);  Surgeon: [name], MD;  Location: [facility] ORS;  Service: Ophthalmology;  Laterality: Right;  [place] 1.09 AP% 19.3 CDE 13.38 Fluid pack lot # [id number]  . CHOLECYSTECTOMY N/A 06/05/2017   Procedure: LAPAROSCOPIC CHOLECYSTECTOMY WITH INTRAOPERATIVE CHOLANGIOGRAM ERAS PATHWAY POSSIBLE NEEDLE CORE BIOPSY OF LIVER;  Surgeon: [name], MD;  Location: [facility] OR;  Service: General;  Laterality: N/A;  ERAS PATHWAY  . COLONOSCOPY    . CORONARY ARTERY BYPASS GRAFT  2005  . EYE SURGERY    . ICD GENERATOR CHANGEOUT N/A 04/07/2017   Procedure: ICD Generator Changeout;  Surgeon: Duke Salvia, MD;  Location: [facility] INVASIVE CV LAB;  Service: Cardiovascular;  Laterality: N/A;  . IMPLANTABLE CARDIOVERTER DEFIBRILLATOR IMPLANT N/A 11/15/2012   STJ single chamber ICD implanted by Dr Graciela Husbands for cardiac arrest   .  INGUINAL HERNIA REPAIR Bilateral 01/17/2015   Procedure: BILATERAL LAPAROSCOPIC INGUINAL HERNIA REPAIR WITH LEFT FEMORAL HERNIA REPAIR;  Surgeon: [name], MD;  Location: [facility] OR;  Service: General;  Laterality: Bilateral;  . INSERTION OF MESH Bilateral 01/17/2015   Procedure: INSERTION OF MESH;  Surgeon: [name], MD;  Location: [facility] OR;  Service: General;  Laterality: Bilateral;  . LAPAROSCOPIC CHOLECYSTECTOMY  06/05/2017  . LEAD INSERTION N/A 04/07/2017   Procedure: RA Lead Insertion;  Surgeon: Duke Salvia, MD;  Location: [facility] INVASIVE  CV LAB;  Service: Cardiovascular;  Laterality: N/A;  . LEAD REVISION/REPAIR N/A 04/08/2017   Procedure: Atrial Lead Revision/Repair;  Surgeon: Duke Salvia, MD;  Location: [facility] INVASIVE CV LAB;  Service: Cardiovascular;  Laterality: N/A;  . LEFT HEART CATH AND CORONARY ANGIOGRAPHY Left 05/14/2019   Procedure: LEFT HEART CATH AND CORONARY ANGIOGRAPHY;  Surgeon: Iran Ouch, MD;  Location: [facility] INVASIVE CV LAB;  Service: Cardiovascular;  Laterality: Left;  . LEFT HEART CATHETERIZATION WITH CORONARY/GRAFT ANGIOGRAM N/A 11/24/2012   Procedure: LEFT HEART CATHETERIZATION WITH Isabel Caprice;  Surgeon: [name], MD;  Location: [facility] CATH LAB;  Service: Cardiovascular;  Laterality: N/A;  . LIVER BIOPSY N/A 06/05/2017   Procedure: SINGLE SITE LIVER BIOPSY ERAS PATHWAY;  Surgeon: [name], MD;  Location: [facility] OR;  Service: General;  Laterality: N/A;  ERAS PATHWAY  . LUMBAR FUSION  09/2007    Allergies  Allergies  Allergen Reactions  . Ezetimibe Other (See Comments)    Pt was in coma for 6 days, numbness  . Penicillins Other (See Comments)    BLISTERS BETWEEN FINGERS Did it involve swelling of the face/tongue/throat, SOB, or low BP? No Did it involve sudden or severe rash/hives, skin peeling, or any reaction on the inside of your mouth or nose? Yes Did you need to seek medical attention at a hospital or doctor's office? No When did it last happen?60 years If all above answers are "NO", may proceed with cephalosporin use.   . Pravastatin Sodium Other (See Comments)    Aches and pains in joints  . Rosuvastatin Other (See Comments)    MYALGIAS  . Niacin Anxiety and Other (See Comments)    "Makes me feel nervous, jittery"    History of Present Illness    [name] Christensen is a 85 y.o. male with a hx of  CAD s/p CABG 2005, ischemic cardiomyopathy with progressive LV dysfunction due to frequent PVC s/p AICD, chronic combined systolic and diastolic heart failure, atrial  fibrillation on dofetilide, RBBB. He was last seen by Dr. Graciela Husbands 01/2020.  LHC 04/2019 with significant underlying heavily calcified three vessel coronary disease with patent graft to SVG-RCA, SVG-diagonal (fills LAD), SVG-OM. [place] to LAD known to be atretic, but native LAD without obstructive disease. EF 20-25% with normal LVEDP. Most recent echo 09/2019 EF 25-30%, mild LVH, global LV hypokinesis, RV with moderately reduced systolic function, RV mildly enlarged, LA mild-moderately dilated, RA moderately dilated, mild to mod MR, moderate TR, moderately elevated PASP.  He has called the office noting intermittent chest pain.  Right sided chest pain that radiated to his back. Also associated with fatigue. Occurred mostly when he was up moving around. He does note some recent increased stress - his wife had 2 falls and has been in a cast. Tells me the middle of last week "something just happened" and he felt better. He took some Tylenol and that helped some. He did take nitroglycerin once without relief.  He did fall one week ago. And his right shoulder is dislocated. He has been doing some gentle stretching. Tells me he just got "moving too fast" and fell - no near syncope nor syncope.   His anginal equivalent was chest tightness while walking on the beach that then went away. Tells me his recent right sided chest pain felt different.   BP at home was running high. Tells me his systolic is 130s-140s now, he has noticed it is improving since his chest pain has resolved.  EKGs/Labs/Other Studies Reviewed:   The following studies were reviewed today:  EKG:  EKG is ordered today.  The ekg ordered today demonstrates SR 75 bpm with 1st degree AV block (PR 304) and frequent PVC in pattern of trigeminy. No acute ST/T wave changes. Lateral lead TWI stable compared to prior EKG.   Recent Labs: 12/30/2019: TSH 2.51 01/25/2020: Hemoglobin 12.7; Magnesium 2.1; Platelets 95 06/21/2020: ALT 11; BUN 18; Creat 1.19;  Potassium 4.8; Sodium 131  Recent Lipid Panel    Component Value Date/Time   CHOL 107 12/30/2019 1019   TRIG 102.0 12/30/2019 1019   HDL 46.80 12/30/2019 1019   CHOLHDL 2 12/30/2019 1019   VLDL 20.4 12/30/2019 1019   LDLCALC 39 12/30/2019 1019    Home Medications   Current Meds  Medication Sig  . acetaminophen (TYLENOL) 500 MG tablet Take 500 mg by mouth at bedtime.  [name] alfuzosin (UROXATRAL) 10 MG 24 hr tablet Take 10 mg by mouth every evening.   [name] atorvastatin (LIPITOR) 20 MG tablet Take 1 tablet (20 mg total) by mouth daily.  . cholecalciferol (VITAMIN D3) 10 MCG (400 UNIT) TABS tablet Take 1 tablet (400 Units total) by mouth daily.  [name] dofetilide (TIKOSYN) 250 MCG capsule Take 1 capsule (250 mcg total) by mouth 2 (two) times daily.  [name] dutasteride (AVODART) 0.5 MG capsule Take 0.5 mg by mouth every evening.   [name] ELIQUIS 5 MG TABS tablet TAKE 1 TABLET BY MOUTH TWICE (2) DAILY  . esomeprazole (NEXIUM) 20 MG capsule Take 1 capsule (20 mg total) by mouth daily at 12 noon.  [name] LINZESS 290 MCG CAPS capsule TAKE 1 CAPSULE BY MOUTH DAILY AS NEEDED (Patient taking differently: Take 290 mcg by mouth daily as needed (constipation). )  . loratadine (CLARITIN) 10 MG tablet Take 1 tablet (10 mg total) by mouth daily.  [name] losartan (COZAAR) 50 MG tablet Take 1 tablet (50 mg total) by mouth daily.  . magnesium oxide (MAG-OX) 400 (241.3 Mg) MG tablet TAKE 1 TABLET BY MOUTH ONCE DAILY  . metoprolol succinate (TOPROL-XL) 50 MG 24 hr tablet Take 1 tablet (50 mg total) by mouth 2 (two) times daily. Take with or immediately following a meal.  . Multiple Vitamin (MULTIVITAMIN WITH MINERALS) TABS tablet Take 1 tablet by mouth every other day.  . nitroGLYCERIN (NITROSTAT) 0.4 MG SL tablet As directed SL  . Polyethyl Glycol-Propyl Glycol (SYSTANE OP) Place 1 drop into both eyes daily as needed (burning eyes).   . promethazine-codeine (PHENERGAN WITH CODEINE) 6.25-10 MG/5ML syrup Take 5 mLs by mouth every 6 (six)  hours as needed.  . triamcinolone cream (KENALOG) 0.1 % Apply 1 application topically 2 (two) times daily as needed. Avoid f/g/a  . vitamin B-12 (CYANOCOBALAMIN) 1000 MCG tablet Take 1,000 mcg by mouth daily.   Current Facility-Administered Medications for the 07/10/20 encounter (Office Visit) with Alver Sorrow, NP  Medication  . sodium chloride flush (NS) 0.9 % injection 3 mL  Review of Systems     Review of Systems  Constitutional: Negative for chills, fever and malaise/fatigue.  Cardiovascular: Positive for chest pain. Negative for dyspnea on exertion, irregular heartbeat, leg swelling, near-syncope, orthopnea, palpitations and syncope.  Respiratory: Negative for cough, shortness of breath and wheezing.   Gastrointestinal: Negative for melena, nausea and vomiting.  Genitourinary: Negative for hematuria.  Neurological: Negative for dizziness, light-headedness and weakness.   All other systems reviewed and are otherwise negative except as noted above.  Physical Exam    VS:  BP (!) 154/70 (BP Location: Left Arm, Patient Position: Sitting, Cuff Size: Normal)   Pulse 75   Ht 5\' 7"  (1.702 m)   Wt 147 lb (66.7 kg)   SpO2 97%   BMI 23.02 kg/m  , BMI Body mass index is 23.02 kg/m. GEN: Well nourished, well developed, in no acute distress. HEENT: normal. Neck: Supple, no JVD, carotid bruits, or masses. Cardiac: RRR, no murmurs, rubs, or gallops. No clubbing, cyanosis, edema.  Radials/DP/PT 2+ and equal bilaterally.  Respiratory:  Respirations regular and unlabored, clear to auscultation bilaterally. GI: Soft, nontender, nondistended, BS + x 4. MS: No deformity or atrophy. Skin: Warm and dry, no rash. Neuro:  Strength and sensation are intact. Psych: Normal affect.  Assessment & Plan    1. Chest pain -called office last week noted right-sided chest pain radiating to his back.  This is different than his anginal equivalent.  Tells me he had pain for a few days relieved by  Tylenol and 1 day suddenly got better.  Of note, he has since had a fall with bruising to the right shoulder and dislocated right shoulder but no recurrent chest pain, pressure, tightness.  Reports no new or worsening dyspnea on exertion.  His EKG today is without acute ST/T wave changes.  No indication for ischemic evaluation at this time.  LHC 04/2019 with significant underlying heavily calcified three-vessel coronary disease with patent grafts.  We reviewed his cath images and discussed further antianginal therapies including CCB or Imdur.  As his symptoms are resolved he would prefer to monitor and reassess symptoms at his upcoming follow-up in 1 month with Dr. Graciela Husbands.  2. CAD s/p CABG - EKG today with no acute ST/T wave changes.  GDMT includes aspirin, beta-blocker, statin, PRN nitroglycerin.   3. HTN -BP mildly elevated in clinic today.  Reports systolic readings in the 130s at home.  He will continue to monitor at home.  Will reassess at follow-up next month.  4. ICM/chronic systolic and diastolic heart failure -Echo 09/2019 EF 25-30%.  Euvolemic and well compensated on exam.  Weight stable compared to previous office visit.  GDMT includes metoprolol, losartan.  No indication for loop diuretic at this time.  May benefit from transition to [facility] or further up-titration of HF therapies but will defer to his primary cardiologist.  5. Atrial fibrillation/flutter/RBBB/PVCs - Continue to follow with EP.  EKG today sinus rhythm with stable T wave inversion in lateral leads, PVC and pattern of trigeminy overall stable compared to previous.  No evidence of recurrent fibrillation/flutter.  6. Chronic anticoagulation - Secondary to PAF.  Denies bleeding complications.  Continue Eliquis 5 mg twice daily.  Does not meet criteria for dose reduction.  7. S/p ICD - Upgraded to single dual chamber 03/2017.  Continue to follow with EP.   8. High risk medication use (Dofetilide) - QT/QTc today on EKG 458/511.   Stable compared to EKG 3 months ago with QTC 517.  Continue to follow with EP.  Disposition: Follow up in 1 month(s) with Dr. Graciela Husbands as previously scheduled.  Alver Sorrow, NP 07/10/2020, 10:06 AM

## 2020-07-10 ENCOUNTER — Ambulatory Visit (INDEPENDENT_AMBULATORY_CARE_PROVIDER_SITE_OTHER): Payer: PPO | Admitting: Family

## 2020-07-10 ENCOUNTER — Encounter: Payer: Self-pay | Admitting: Family

## 2020-07-10 ENCOUNTER — Other Ambulatory Visit: Payer: Self-pay

## 2020-07-10 VITALS — BP 154/70 | HR 75 | Ht 67.0 in | Wt 147.0 lb

## 2020-07-10 DIAGNOSIS — I4819 Other persistent atrial fibrillation: Secondary | ICD-10-CM

## 2020-07-10 DIAGNOSIS — Z7901 Long term (current) use of anticoagulants: Secondary | ICD-10-CM

## 2020-07-10 DIAGNOSIS — Z79899 Other long term (current) drug therapy: Secondary | ICD-10-CM

## 2020-07-10 DIAGNOSIS — I493 Ventricular premature depolarization: Secondary | ICD-10-CM | POA: Diagnosis not present

## 2020-07-10 DIAGNOSIS — R079 Chest pain, unspecified: Secondary | ICD-10-CM | POA: Diagnosis not present

## 2020-07-10 DIAGNOSIS — I255 Ischemic cardiomyopathy: Secondary | ICD-10-CM

## 2020-07-10 DIAGNOSIS — I5022 Chronic systolic (congestive) heart failure: Secondary | ICD-10-CM

## 2020-07-10 DIAGNOSIS — I10 Essential (primary) hypertension: Secondary | ICD-10-CM

## 2020-07-10 NOTE — Progress Notes (Signed)
Remote ICD transmission.   

## 2020-07-10 NOTE — Patient Instructions (Addendum)
Medication Instructions:  No medication changes today.  *If you need a refill on your cardiac medications before your next appointment, please call your pharmacy*   Lab Work: No lab work today.   Testing/Procedures: Your EKG today was stable compared to previous - this is a great result!  Follow-Up: At [facility], you and your health needs are our priority.  As part of our continuing mission to provide you with exceptional heart care, we have created designated Provider Care Teams.  These Care Teams include your primary Cardiologist (physician) and Advanced Practice Providers (APPs -  Physician Assistants and Nurse Practitioners) who all work together to provide you with the care you need, when you need it.  We recommend signing up for the patient portal called "MyChart".  Sign up information is provided on this After Visit Summary.  MyChart is used to connect with patients for Virtual Visits (Telemedicine).  Patients are able to view lab/test results, encounter notes, upcoming appointments, etc.  Non-urgent messages can be sent to your provider as well.   To learn more about what you can do with MyChart, go to [email or web address].    Your next appointment:  In September with Dr. [name] as previously scheduled  Other Instructions  Call our office if you have recurrent chest pain, pressure, tightness. Your previous chest pain does not sound typical for heart related pain. If you have recurrent chest pain we can consider adding another medication to help prevent angina (heart related pain).

## 2020-07-21 DIAGNOSIS — M25511 Pain in right shoulder: Secondary | ICD-10-CM | POA: Diagnosis not present

## 2020-07-25 ENCOUNTER — Other Ambulatory Visit: Payer: Self-pay

## 2020-07-25 ENCOUNTER — Ambulatory Visit: Payer: PPO | Admitting: Dermatology

## 2020-07-25 DIAGNOSIS — Z1283 Encounter for screening for malignant neoplasm of skin: Secondary | ICD-10-CM | POA: Diagnosis not present

## 2020-07-25 DIAGNOSIS — L820 Inflamed seborrheic keratosis: Secondary | ICD-10-CM

## 2020-07-25 DIAGNOSIS — L578 Other skin changes due to chronic exposure to nonionizing radiation: Secondary | ICD-10-CM | POA: Diagnosis not present

## 2020-07-25 DIAGNOSIS — L570 Actinic keratosis: Secondary | ICD-10-CM | POA: Diagnosis not present

## 2020-07-25 DIAGNOSIS — L720 Epidermal cyst: Secondary | ICD-10-CM

## 2020-07-25 NOTE — Progress Notes (Signed)
   Follow-Up Visit   Subjective  [name] Christensen is a 86 y.o. male who presents for the following: Actinic Keratosis (2 months f/u hx of Aks, hx of ISK ) and Annual Exam (UBSE, pt concerned about a few spots on his nose, legs, arms, back). The patient presents for Upper Body Skin Exam (UBSE) for skin cancer screening and mole check.  The following portions of the chart were reviewed this encounter and updated as appropriate:  Tobacco  Allergies  Meds  Problems  Med Hx  Surg Hx  Fam Hx     Review of Systems:  No other skin or systemic complaints except as noted in HPI or Assessment and Plan.  Objective  Well appearing patient in no apparent distress; mood and affect are within normal limits.  All skin waist up examined.  Objective  Left Low back paraspinal: 1.0 Subcutaneous nodule.   Objective  Head - Anterior (Face) (4): Erythematous thin papules/macules with gritty scale.   Objective  arms,legs (22): Erythematous keratotic or waxy stuck-on papule or plaque.    Assessment & Plan  Epidermal cyst Left Low back paraspinal  Benign-appearing.  Observation.  Call clinic is if any changes  AK (actinic keratosis) (4) Head - Anterior (Face)  Destruction of lesion - Head - Anterior (Face) Complexity: simple   Destruction method: cryotherapy   Informed consent: discussed and consent obtained   Timeout:  patient name, date of birth, surgical site, and procedure verified Lesion destroyed using liquid nitrogen: Yes   Region frozen until ice ball extended beyond lesion: Yes   Outcome: patient tolerated procedure well with no complications   Post-procedure details: wound care instructions given    Inflamed seborrheic keratosis (22) arms,legs  Destruction of lesion - arms,legs Complexity: simple   Destruction method: cryotherapy   Informed consent: discussed and consent obtained   Timeout:  patient name, date of birth, surgical site, and procedure verified Lesion  destroyed using liquid nitrogen: Yes   Region frozen until ice ball extended beyond lesion: Yes   Outcome: patient tolerated procedure well with no complications   Post-procedure details: wound care instructions given    Actinic Damage - diffuse scaly erythematous macules with underlying dyspigmentation - Recommend daily broad spectrum sunscreen SPF 30+ to sun-exposed areas, reapply every 2 hours as needed.  - Call for new or changing lesions.  Return in about 1 year (around 07/25/2021) for AKS, ISK.  I, Marye Round, CMA, am acting as scribe for Sarina Ser, MD .  Documentation: I have reviewed the above documentation for accuracy and completeness, and I agree with the above.  Sarina Ser, MD

## 2020-07-25 NOTE — Patient Instructions (Signed)
Cryotherapy Aftercare  . Wash gently with soap and water everyday.   . Apply Vaseline and Band-Aid daily until healed. Recommend daily broad spectrum sunscreen SPF 30+ to sun-exposed areas, reapply every 2 hours as needed. Call for new or changing lesions.  

## 2020-07-29 ENCOUNTER — Encounter: Payer: Self-pay | Admitting: Dermatology

## 2020-08-15 ENCOUNTER — Encounter: Payer: Self-pay | Admitting: Internal Medicine

## 2020-08-15 ENCOUNTER — Ambulatory Visit (INDEPENDENT_AMBULATORY_CARE_PROVIDER_SITE_OTHER): Payer: PPO | Admitting: Internal Medicine

## 2020-08-15 ENCOUNTER — Other Ambulatory Visit: Payer: Self-pay

## 2020-08-15 VITALS — BP 130/66 | HR 63 | Ht 67.0 in | Wt 146.5 lb

## 2020-08-15 DIAGNOSIS — I4819 Other persistent atrial fibrillation: Secondary | ICD-10-CM | POA: Diagnosis not present

## 2020-08-15 DIAGNOSIS — I493 Ventricular premature depolarization: Secondary | ICD-10-CM | POA: Diagnosis not present

## 2020-08-15 DIAGNOSIS — Z79899 Other long term (current) drug therapy: Secondary | ICD-10-CM | POA: Diagnosis not present

## 2020-08-15 DIAGNOSIS — I255 Ischemic cardiomyopathy: Secondary | ICD-10-CM | POA: Diagnosis not present

## 2020-08-15 DIAGNOSIS — I10 Essential (primary) hypertension: Secondary | ICD-10-CM | POA: Diagnosis not present

## 2020-08-15 DIAGNOSIS — Z95810 Presence of automatic (implantable) cardiac defibrillator: Secondary | ICD-10-CM | POA: Diagnosis not present

## 2020-08-15 DIAGNOSIS — I5022 Chronic systolic (congestive) heart failure: Secondary | ICD-10-CM | POA: Diagnosis not present

## 2020-08-15 DIAGNOSIS — I4892 Unspecified atrial flutter: Secondary | ICD-10-CM | POA: Diagnosis not present

## 2020-08-15 MED ORDER — ENTRESTO 24-26 MG PO TABS: 1.0000 | ORAL_TABLET | Freq: Two times a day (BID) | ORAL | 6 refills | Status: DC

## 2020-08-15 NOTE — Patient Instructions (Addendum)
Medication Instructions:  - Your physician has recommended you make the following change in your medication:   1) START entresto 24/26 mg- take 1 tablet by mouth twice daily    *If you need a refill on your cardiac medications before your next appointment, please call your pharmacy*   Lab Work: - Your physician recommends that you have lab work today: BMP/ CBC/ Magnesium  If you have labs (blood work) drawn today and your tests are completely normal, you will receive your results only by: [name] MyChart Message (if you have MyChart) OR . A paper copy in the mail If you have any lab test that is abnormal or we need to change your treatment, we will call you to review the results.   Testing/Procedures: - none ordered   Follow-Up: At [facility], you and your health needs are our priority.  As part of our continuing mission to provide you with exceptional heart care, we have created designated Provider Care Teams.  These Care Teams include your primary Cardiologist (physician) and Advanced Practice Providers (APPs -  Physician Assistants and Nurse Practitioners) who all work together to provide you with the care you need, when you need it.  We recommend signing up for the patient portal called "MyChart".  Sign up information is provided on this After Visit Summary.  MyChart is used to connect with patients for Virtual Visits (Telemedicine).  Patients are able to view lab/test results, encounter notes, upcoming appointments, etc.  Non-urgent messages can be sent to your provider as well.   To learn more about what you can do with MyChart, go to [email or web address].    Your next appointment:   3 month(s)  The format for your next appointment:   In Person  Provider:   Virl Axe, MD   Other Instructions N/a  Delene Loll (Sacubitril); Valsartan Oral Tablets  What is this medicine? SACUBITRIL; VALSARTAN (sak UE bi tril; val SAR tan) is a combination of a neprilysin inhibitor and  a an angiotensin II receptor blocker. It treats heart failure. This medicine may be used for other purposes; ask your health care provider or pharmacist if you have questions. COMMON BRAND NAME(S): Entresto What should I tell my health care provider before I take this medicine? They need to know if you have any of these conditions:  diabetes and take a medicine that contains aliskiren  kidney disease  liver disease  an unusual or allergic reaction to sacubitril; valsartan, drugs called angiotensin converting enzyme (ACE) inhibitors, angiotensin II receptor blockers (ARBs), other medicines, foods, dyes, or preservatives  pregnant or trying to get pregnant  breast-feeding How should I use this medicine? Take this drug by mouth. Take it as directed on the prescription label at the same time every day. You can take it with or without food. If it upsets your stomach, take it with food. Keep taking it unless your health care provider tells you to stop. Talk to your health care provider about the use of this drug in children. While it may be prescribed for children as young as 1 for selected conditions, precautions do apply. Overdosage: If you think you have taken too much of this medicine contact a poison control center or emergency room at once. NOTE: This medicine is only for you. Do not share this medicine with others. What if I miss a dose? If you miss a dose, take it as soon as you can. If it is almost time for your next dose, take only that  dose. Do not take double or extra doses. What may interact with this medicine? Do not take this medicine with any of the following medicines:  aliskiren if you have diabetes  angiotensin-converting enzyme (ACE) inhibitors, like benazepril, captopril, enalapril, fosinopril, lisinopril, or ramipril This medicine may also interact with the following medicines:  angiotensin II receptor blockers (ARBs) like azilsartan, candesartan, eprosartan,  irbesartan, losartan, olmesartan, telmisartan, or valsartan  lithium  NSAIDS, medicines for pain and inflammation, like ibuprofen or naproxen  potassium-sparing diuretics like amiloride, spironolactone, and triamterene  potassium supplements This list may not describe all possible interactions. Give your health care provider a list of all the medicines, herbs, non-prescription drugs, or dietary supplements you use. Also tell them if you smoke, drink alcohol, or use illegal drugs. Some items may interact with your medicine. What should I watch for while using this medicine? Tell your doctor or healthcare professional if your symptoms do not start to get better or if they get worse. Do not become pregnant while taking this medicine. Women should inform their doctor if they wish to become pregnant or think they might be pregnant. There is a potential for serious side effects to an unborn child. Talk to your health care professional or pharmacist for more information. You may get dizzy. Do not drive, use machinery, or do anything that needs mental alertness until you know how this medicine affects you. Do not stand or sit up quickly, especially if you are an older patient. This reduces the risk of dizzy or fainting spells. Avoid alcoholic drinks; they can make you more dizzy. What side effects may I notice from receiving this medicine? Side effects that you should report to your doctor or health care professional as soon as possible:  allergic reactions like skin rash, itching or hives, swelling of the face, lips, or tongue  signs and symptoms of increased potassium like muscle weakness; chest pain; or fast, irregular heartbeat  signs and symptoms of kidney injury like trouble passing urine or change in the amount of urine  signs and symptoms of low blood pressure like feeling dizzy or lightheaded, or if you develop extreme fatigue Side effects that usually do not require medical attention  (report to your doctor or health care professional if they continue or are bothersome):  cough This list may not describe all possible side effects. Call your doctor for medical advice about side effects. You may report side effects to FDA at 1-800-FDA-1088. Where should I keep my medicine? Keep out of the reach of children and pets. Store at room temperature between 20 and 25 degrees C (68 and 77 degrees F). Protect from moisture. Keep the container tightly closed. Throw away any unused drug after the expiration date. NOTE: This sheet is a summary. It may not cover all possible information. If you have questions about this medicine, talk to your doctor, pharmacist, or health care provider.  2020 [employer]/Gold Standard (2019-06-16 16:03:07)

## 2020-08-15 NOTE — Progress Notes (Signed)
Patient Care Team: Plotnikov, Georgina Quint, MD as PCP - General (Internal Medicine) Wendall Stade, MD as PCP - Cardiology (Cardiology) Duke Salvia, MD as PCP - Electrophysiology (Cardiology) Lindell Noe Lehman Prom., MD (Urology) [name], MD as Consulting Physician (General Surgery) Pyrtle, Carie Caddy, MD as Consulting Physician (Gastroenterology) [name], MD as Referring Physician (Ophthalmology)   HPI  [name] Christensen is a 86 y.o. male   seen in follow-up for ischemic cardiomyopathy with progressive left ventricular dysfunction in the context of frequent PVCs and chronic congestive heart failure.  He has persistent atrial fibrillation for which she was started on dofetilide 8/20    Have been considering AFib, Pacing and PVCs as cause of his symptoms and LV dtysfunction>> reprogramming of his device eliminated pacing ( RBBB), dofetilide >>sinus, so the PVCs remain unaddressed with failed Anti-arrhythmic drugs suppression   No intecurrent afib But over the last 3 months significant increase in fatigue--sometimes just morning activities Wakes up w energy and then it gets lost. Family has commented on his being pale and depressed  No sob, no edema or abdominal distension No chest pain   More recently with visiting children has been not sleeping well     DATE TEST PVC  1/15 Holter 13%  1/18 Holter 31%  2/20 ZIO 7.2% ( all one morphology)   9/20 Pacer 12%  11/20 Pacer 11%  2/21 Pacer 9.4%  9/21  13%     DATE TEST EF   12/13 Cath   Patent Grafts  2/15 Echo   45-50 %   12/17 Echo   50-55 %   4/19 Echo 20-25%   10/19 Echo  25-30%   1/20 Echo  20-25%   6/20 LHC 20-25% LIMA-LADatretic SVG-OM,SVG-D with filling of LAD,SVG-OM patent  11/20 Echo  25-30%    Date Cr K Mg Hgb  6/20 1.14 4.9 2.0(3/20) 12.5   8/20 1.03 4.2 1.8 12.8  2/21 1.06 4.0 2.1 12.7  7/21 1.19 4.8     Antiarrhythmics Date Reason stopped  amio 7/19    dofetilide           Records and Results Reviewed *  Past Medical History:  Diagnosis Date  . Actinic keratosis 06/09/2019   L sup forehead at hairline - bx proven  . AICD (automatic cardioverter/defibrillator) present 04/07/2017  . Allergy   . Anxiety   . BPH (benign prostatic hypertrophy)    elevated PSA Dr. Lindell Noe Bx 2010  . Cardiac arrest [facility])    a. out-of-hospital arrest 11/13/2012 - EF 40-45%, patent grafts on cath, received St. Jude AICD.  [name] Carotid disease, bilateral (HCC)    a. 0-39% by doppers.  . Cataract    bil cataracts removed  . CHF (congestive heart failure) (HCC)   . Coronary artery disease    a. s/p MI/CABG 2005. b. s/p cath at time of VF arrest 10/2013 - grafts patent.  . Cough   . Diverticulosis   . Elevated LFTs    a. 10/2012 felt due to cardiac arrest - Hepatitis C Ab reactive from 10/27/2012>>Hep C RNA PCR negative 11/19/2012.   [name] GERD (gastroesophageal reflux disease)   . Hyperlipidemia   . Hypertension   . Internal hemorrhoids   . Myocardial infarction (HCC)    2005 - CABG x 5 2005  . RBBB   . Tubular adenoma of colon   . Ventricular bigeminy    a. Event monitor 01/2013: NSR with PVCs  and occ bigeminy.    Past Surgical History:  Procedure Laterality Date  . APPENDECTOMY    . CARDIAC CATHETERIZATION  2007   with patent graft anatomy atretic left internal mammary  artery to the LAD which is nonobstructive. Will restart study June 08, 2007  . CARDIOVERSION N/A 03/27/2018   Procedure: CARDIOVERSION;  Surgeon: Iran Ouch, MD;  Location: [facility] ORS;  Service: Cardiovascular;  Laterality: N/A;  . CARDIOVERSION N/A 01/04/2019   Procedure: CARDIOVERSION (CATH LAB);  Surgeon: Iran Ouch, MD;  Location: [facility] ORS;  Service: Cardiovascular;  Laterality: N/A;  . CARDIOVERSION N/A 07/07/2019   Procedure: CARDIOVERSION;  Surgeon: Wendall Stade, MD;  Location: [facility] ENDOSCOPY;  Service: Cardiovascular;  Laterality: N/A;  . CATARACT EXTRACTION W/PHACO Right 04/23/2016    Procedure: CATARACT EXTRACTION PHACO AND INTRAOCULAR LENS PLACEMENT (IOC);  Surgeon: [name], MD;  Location: [facility] ORS;  Service: Ophthalmology;  Laterality: Right;  [place] 1.09 AP% 19.3 CDE 13.38 Fluid pack lot # [id number]  . CHOLECYSTECTOMY N/A 06/05/2017   Procedure: LAPAROSCOPIC CHOLECYSTECTOMY WITH INTRAOPERATIVE CHOLANGIOGRAM ERAS PATHWAY POSSIBLE NEEDLE CORE BIOPSY OF LIVER;  Surgeon: [name], MD;  Location: [facility] OR;  Service: General;  Laterality: N/A;  ERAS PATHWAY  . COLONOSCOPY    . CORONARY ARTERY BYPASS GRAFT  2005  . EYE SURGERY    . ICD GENERATOR CHANGEOUT N/A 04/07/2017   Procedure: ICD Generator Changeout;  Surgeon: Duke Salvia, MD;  Location[facility] INVASIVE CV LAB;  Service: Cardiovascular;  Laterality: N/A;  . IMPLANTABLE CARDIOVERTER DEFIBRILLATOR IMPLANT N/A 11/13/2012   STJ single chamber ICD implanted by Dr Graciela Husbands for cardiac arrest   . INGUINAL HERNIA REPAIR Bilateral 01/17/2015   Procedure: BILATERAL LAPAROSCOPIC INGUINAL HERNIA REPAIR WITH LEFT FEMORAL HERNIA REPAIR;  Surgeon: [name], MD;  Location: [facility] OR;  Service: General;  Laterality: Bilateral;  . INSERTION OF MESH Bilateral 01/17/2015   Procedure: INSERTION OF MESH;  Surgeon: [name], MD;  Location: [facility] OR;  Service: General;  Laterality: Bilateral;  . LAPAROSCOPIC CHOLECYSTECTOMY  06/05/2017  . LEAD INSERTION N/A 04/07/2017   Procedure: RA Lead Insertion;  Surgeon: Duke Salvia, MD;  Location: [facility] INVASIVE CV LAB;  Service: Cardiovascular;  Laterality: N/A;  . LEAD REVISION/REPAIR N/A 04/08/2017   Procedure: Atrial Lead Revision/Repair;  Surgeon: Duke Salvia, MD;  Location: [facility] INVASIVE CV LAB;  Service: Cardiovascular;  Laterality: N/A;  . LEFT HEART CATH AND CORONARY ANGIOGRAPHY Left 05/14/2019   Procedure: LEFT HEART CATH AND CORONARY ANGIOGRAPHY;  Surgeon: Iran Ouch, MD;  Location: [facility] INVASIVE CV LAB;  Service: Cardiovascular;  Laterality: Left;  . LEFT HEART CATHETERIZATION WITH  CORONARY/GRAFT ANGIOGRAM N/A 11/22/2012   Procedure: LEFT HEART CATHETERIZATION WITH Isabel Caprice;  Surgeon: [name], MD;  Location: [facility] CATH LAB;  Service: Cardiovascular;  Laterality: N/A;  . LIVER BIOPSY N/A 06/05/2017   Procedure: SINGLE SITE LIVER BIOPSY ERAS PATHWAY;  Surgeon: [name], MD;  Location: [facility] OR;  Service: General;  Laterality: N/A;  ERAS PATHWAY  . LUMBAR FUSION  09/2007    Current Meds  Medication Sig  . acetaminophen (TYLENOL) 500 MG tablet Take 500 mg by mouth at bedtime.  [name] alfuzosin (UROXATRAL) 10 MG 24 hr tablet Take 10 mg by mouth every evening.   [name] atorvastatin (LIPITOR) 20 MG tablet Take 1 tablet (20 mg total) by mouth daily.  . cholecalciferol (VITAMIN D3) 10 MCG (400 UNIT) TABS tablet Take 1 tablet (400 Units total) by mouth daily.  [name]  dofetilide (TIKOSYN) 250 MCG capsule Take 1 capsule (250 mcg total) by mouth 2 (two) times daily.  [name] dutasteride (AVODART) 0.5 MG capsule Take 0.5 mg by mouth every evening.   [name] ELIQUIS 5 MG TABS tablet TAKE 1 TABLET BY MOUTH TWICE (2) DAILY  . esomeprazole (NEXIUM) 20 MG capsule Take 1 capsule (20 mg total) by mouth daily at 12 noon.  . linaclotide (LINZESS) 290 MCG CAPS capsule Take 290 mcg by mouth daily as needed.  . loratadine (CLARITIN) 10 MG tablet Take 1 tablet (10 mg total) by mouth daily.  [name] losartan (COZAAR) 50 MG tablet Take 1 tablet (50 mg total) by mouth daily.  . magnesium oxide (MAG-OX) 400 (241.3 Mg) MG tablet TAKE 1 TABLET BY MOUTH ONCE DAILY  . metoprolol succinate (TOPROL-XL) 50 MG 24 hr tablet Take 1 tablet (50 mg total) by mouth 2 (two) times daily. Take with or immediately following a meal.  . Multiple Vitamin (MULTIVITAMIN WITH MINERALS) TABS tablet Take 1 tablet by mouth every other day.  . nitroGLYCERIN (NITROSTAT) 0.4 MG SL tablet As directed SL  . Polyethyl Glycol-Propyl Glycol (SYSTANE OP) Place 1 drop into both eyes daily as needed (burning eyes).   . promethazine-codeine  (PHENERGAN WITH CODEINE) 6.25-10 MG/5ML syrup Take 5 mLs by mouth every 6 (six) hours as needed.  . triamcinolone cream (KENALOG) 0.1 % Apply 1 application topically 2 (two) times daily as needed. Avoid f/g/a  . vitamin B-12 (CYANOCOBALAMIN) 1000 MCG tablet Take 1,000 mcg by mouth daily.   Current Facility-Administered Medications for the 08/15/20 encounter (Office Visit) with Duke Salvia, MD  Medication  . sodium chloride flush (NS) 0.9 % injection 3 mL   Current Outpatient Medications on File Prior to Visit  Medication Sig Dispense Refill  . acetaminophen (TYLENOL) 500 MG tablet Take 500 mg by mouth at bedtime.    [name] alfuzosin (UROXATRAL) 10 MG 24 hr tablet Take 10 mg by mouth every evening.     [name] atorvastatin (LIPITOR) 20 MG tablet Take 1 tablet (20 mg total) by mouth daily. 90 tablet 3  . cholecalciferol (VITAMIN D3) 10 MCG (400 UNIT) TABS tablet Take 1 tablet (400 Units total) by mouth daily. 30 tablet 11  . dofetilide (TIKOSYN) 250 MCG capsule Take 1 capsule (250 mcg total) by mouth 2 (two) times daily. 60 capsule 5  . dutasteride (AVODART) 0.5 MG capsule Take 0.5 mg by mouth every evening.     [name] ELIQUIS 5 MG TABS tablet TAKE 1 TABLET BY MOUTH TWICE (2) DAILY 180 tablet 3  . esomeprazole (NEXIUM) 20 MG capsule Take 1 capsule (20 mg total) by mouth daily at 12 noon. 30 capsule 3  . linaclotide (LINZESS) 290 MCG CAPS capsule Take 290 mcg by mouth daily as needed.    . loratadine (CLARITIN) 10 MG tablet Take 1 tablet (10 mg total) by mouth daily. 30 tablet 11  . losartan (COZAAR) 50 MG tablet Take 1 tablet (50 mg total) by mouth daily. 90 tablet 3  . magnesium oxide (MAG-OX) 400 (241.3 Mg) MG tablet TAKE 1 TABLET BY MOUTH ONCE DAILY 30 tablet 6  . metoprolol succinate (TOPROL-XL) 50 MG 24 hr tablet Take 1 tablet (50 mg total) by mouth 2 (two) times daily. Take with or immediately following a meal. 180 tablet 3  . Multiple Vitamin (MULTIVITAMIN WITH MINERALS) TABS tablet Take 1 tablet by  mouth every other day.    . nitroGLYCERIN (NITROSTAT) 0.4 MG SL tablet As directed SL  25 tablet 3  . Polyethyl Glycol-Propyl Glycol (SYSTANE OP) Place 1 drop into both eyes daily as needed (burning eyes).     . promethazine-codeine (PHENERGAN WITH CODEINE) 6.25-10 MG/5ML syrup Take 5 mLs by mouth every 6 (six) hours as needed. 300 mL 0  . triamcinolone cream (KENALOG) 0.1 % Apply 1 application topically 2 (two) times daily as needed. Avoid f/g/a 80 g 0  . vitamin B-12 (CYANOCOBALAMIN) 1000 MCG tablet Take 1,000 mcg by mouth daily.    [name] LINZESS 290 MCG CAPS capsule TAKE 1 CAPSULE BY MOUTH DAILY AS NEEDED (Patient taking differently: Take 290 mcg by mouth daily as needed (constipation). ) 30 capsule 11   Current Facility-Administered Medications on File Prior to Visit  Medication Dose Route Frequency Provider Last Rate Last Admin  . sodium chloride flush (NS) 0.9 % injection 3 mL  3 mL Intravenous Q12H Duke Salvia, MD         Allergies  Allergen Reactions  . Ezetimibe Other (See Comments)    Pt was in coma for 6 days, numbness  . Penicillins Other (See Comments)    BLISTERS BETWEEN FINGERS Did it involve swelling of the face/tongue/throat, SOB, or low BP? No Did it involve sudden or severe rash/hives, skin peeling, or any reaction on the inside of your mouth or nose? Yes Did you need to seek medical attention at a hospital or doctor's office? No When did it last happen?60 years If all above answers are "NO", may proceed with cephalosporin use.   . Pravastatin Sodium Other (See Comments)    Aches and pains in joints  . Rosuvastatin Other (See Comments)    MYALGIAS  . Niacin Anxiety and Other (See Comments)    "Makes me feel nervous, jittery"      Review of Systems negative except from HPI and PMH  Physical Exam BP 130/66 (BP Location: Left Arm, Patient Position: Sitting, Cuff Size: Normal)   Pulse 63   Ht 5\' 7"  (1.702 m)   Wt 146 lb 8 oz (66.5 kg)   SpO2 98%   BMI  22.95 kg/m  Well developed and well nourished in no acute distress HENT normal Neck supple with JVP-flat Clear Device pocket well healed; without hematoma or erythema.  There is no tethering  Irregular rate and rhythm, no murmur Abd-soft with active BS No Clubbing cyanosis   edema Skin-warm and dry A & Oriented  Grossly normal sensory and motor function  ECG sinus @ 63\ 26/21/55 Freq PVCs       ASSESSMENT & PLAN:    Atrial fibrillation/flutter-persistent  PVCs-right bundle branch superior axis identified by the device with decreased ventricular pacing  Cardiomyopathy recurrent  History of aborted sudden cardiac death    Implantable defibrillator-St. Jude   upgraded single-dual-chamber May 2018  The patient's device was interrogated.  The information was reviewed. No changes were made in the programming.     Ischemic cardiomyopathy with prior CABG    Congestive Heart Failure chronic systolic  High Risk Medication Surveillance,Dofetilide  Right bundle branch block.  Sinus bradycardia/chronotropic incompetence  Scant intercurrent AFib/flutter on dofetilide  Surveillance labs due  No symptoms of ischemia--  With LV dysfunction, will change losartan >> entresto maybe some augmented benefits for PVC suppression ( data>> VT Rx reduction)  PVC burden seems to have increased and withit the VIP algorithm has been challenged such that he is Vpacing somewhat more< but less than 20 %  Will try overdrive suppression, but this may  encroach on AV nodal conduction, forcing RV apical pacing which may be deleterious  At increase of VIP to 450 and rate to 70 the retrograde conduction from the PVCs in refractory and thus not resetting the VIP algorithm--so more intrinsic conduction   Pale>> CBC  Family is concerned about depression, but I wonder whether he just feels lousy with secondary lassitude.  Would defer to others to make this diagnosis  If nothing else comes  forth, would consider metop>>propranolol of I-Na current blockade or ranolazine for augmentation of dofetilide              .  Sherryl Manges, MD  08/15/2020 8:40 AM     [facility] [address] [place] [address] [phone] (office) [phone] (fax) yes.

## 2020-08-16 ENCOUNTER — Telehealth: Payer: Self-pay | Admitting: Internal Medicine

## 2020-08-16 ENCOUNTER — Telehealth: Payer: Self-pay

## 2020-08-16 LAB — CBC WITH DIFFERENTIAL/PLATELET
Basophils Absolute: 0 10*3/uL (ref 0.0–0.2)
Basos: 1 %
EOS (ABSOLUTE): 0.2 10*3/uL (ref 0.0–0.4)
Eos: 3 %
Hematocrit: 33.2 % — ABNORMAL LOW (ref 37.5–51.0)
Hemoglobin: 11.3 g/dL — ABNORMAL LOW (ref 13.0–17.7)
Immature Grans (Abs): 0.1 10*3/uL (ref 0.0–0.1)
Immature Granulocytes: 1 %
Lymphocytes Absolute: 1.5 10*3/uL (ref 0.7–3.1)
Lymphs: 25 %
MCH: 31 pg (ref 26.6–33.0)
MCHC: 34 g/dL (ref 31.5–35.7)
MCV: 91 fL (ref 79–97)
Monocytes Absolute: 0.5 10*3/uL (ref 0.1–0.9)
Monocytes: 8 %
Neutrophils Absolute: 3.8 10*3/uL (ref 1.4–7.0)
Neutrophils: 62 %
Platelets: 124 10*3/uL — ABNORMAL LOW (ref 150–450)
RBC: 3.64 x10E6/uL — ABNORMAL LOW (ref 4.14–5.80)
RDW: 11.7 % (ref 11.6–15.4)
WBC: 6.1 10*3/uL (ref 3.4–10.8)

## 2020-08-16 LAB — BASIC METABOLIC PANEL
BUN/Creatinine Ratio: 12 (ref 10–24)
BUN: 14 mg/dL (ref 8–27)
CO2: 25 mmol/L (ref 20–29)
Calcium: 11.3 mg/dL — ABNORMAL HIGH (ref 8.6–10.2)
Chloride: 95 mmol/L — ABNORMAL LOW (ref 96–106)
Creatinine, Ser: 1.14 mg/dL (ref 0.76–1.27)
GFR calc Af Amer: 67 mL/min/{1.73_m2} (ref >59)
GFR calc non Af Amer: 58 mL/min/{1.73_m2} — ABNORMAL LOW (ref >59)
Glucose: 93 mg/dL (ref 65–99)
Potassium: 5 mmol/L (ref 3.5–5.2)
Sodium: 131 mmol/L — ABNORMAL LOW (ref 134–144)

## 2020-08-16 LAB — MAGNESIUM: Magnesium: 2.1 mg/dL (ref 1.6–2.3)

## 2020-08-16 NOTE — Telephone Encounter (Signed)
**Note De-Identified  Obfuscation** I started a Entresto PA through covermymeds: Key: [id number]

## 2020-08-16 NOTE — Telephone Encounter (Signed)
Patient concerned about yesterdays labs  Please call.

## 2020-08-16 NOTE — Telephone Encounter (Signed)
To Dr. [name] to please review and sign off on final lab report.

## 2020-08-16 NOTE — Telephone Encounter (Signed)
**Note De-Identified  Obfuscation** Following message received from covermymeds:  Bennet Zilka Key: [id number] - PA Case ID: [id number]  Outcome Approved today  PA Case: [id number] Status: Approved, Coverage Starts on: 08/16/2020, Coverage Ends on: 08/16/2021.  Drug Entresto 24-26MG  tablets  [employer] Medicare 4-Part NCPDP Electronic PA Form   I have notified [place] of this approval.

## 2020-08-17 NOTE — Telephone Encounter (Signed)
Deboraha Sprang, MD  08/17/2020 3:12 PM EDT     Please Inform Patient  Labs are normal x *mild anemia and elevated Calcium both of which seem to have been problems off and on for the last few years and are not out of the range of what has been seen previously  worth gettig them both checked again in a few months and have included APLOTNIKOV on this thread  Thanks

## 2020-08-17 NOTE — Telephone Encounter (Signed)
I spoke with the patient and advised him of his results and Dr. Olin Pia recommendations.  He is aware that Dr. Alain Marion has been notified as well.

## 2020-08-17 NOTE — Telephone Encounter (Signed)
Patient is aware we are awaiting Dr. [name], but he would still like a call from the nurse .

## 2020-08-17 NOTE — Telephone Encounter (Signed)
I attempted to call the patient. No answer- I left a message for the patient to please call back.

## 2020-08-18 LAB — CUP PACEART INCLINIC DEVICE CHECK
Brady Statistic RA Percent Paced: 18 %
Brady Statistic RV Percent Paced: 17 %
Date Time Interrogation Session: 20210921185440
Implantable Lead Implant Date: 20131218
Implantable Lead Implant Date: 20180514
Implantable Lead Location: 753859
Implantable Lead Location: 753860
Implantable Lead Model: 5076
Implantable Pulse Generator Implant Date: 20180514
Lead Channel Pacing Threshold Amplitude: 0.5 V
Lead Channel Pacing Threshold Amplitude: 1.25 V
Lead Channel Pacing Threshold Pulse Width: 0.5 ms
Lead Channel Pacing Threshold Pulse Width: 0.5 ms
Lead Channel Sensing Intrinsic Amplitude: 3.5 mV
Lead Channel Sensing Intrinsic Amplitude: 4 mV
Lead Channel Setting Pacing Amplitude: 2 V
Lead Channel Setting Pacing Amplitude: 2.5 V
Lead Channel Setting Pacing Pulse Width: 0.5 ms
Lead Channel Setting Sensing Sensitivity: 0.5 mV
Pulse Gen Serial Number: 1245762

## 2020-08-25 ENCOUNTER — Other Ambulatory Visit: Payer: Self-pay | Admitting: Dermatology

## 2020-08-25 ENCOUNTER — Other Ambulatory Visit: Payer: Self-pay | Admitting: Internal Medicine

## 2020-08-25 DIAGNOSIS — S40862A Insect bite (nonvenomous) of left upper arm, initial encounter: Secondary | ICD-10-CM

## 2020-08-25 DIAGNOSIS — W57XXXA Bitten or stung by nonvenomous insect and other nonvenomous arthropods, initial encounter: Secondary | ICD-10-CM

## 2020-08-27 ENCOUNTER — Other Ambulatory Visit: Payer: Self-pay | Admitting: Internal Medicine

## 2020-08-27 MED ORDER — BETAMETHASONE DIPROPIONATE 0.05 % EX CREA: TOPICAL_CREAM | Freq: Two times a day (BID) | CUTANEOUS | 1 refills | Status: DC

## 2020-08-27 MED ORDER — PROMETHAZINE-CODEINE 6.25-10 MG/5ML PO SYRP
5.0000 mL | ORAL_SOLUTION | Freq: Four times a day (QID) | ORAL | 0 refills | Status: DC | PRN
Start: 2020-08-27 — End: 2021-01-01

## 2020-08-28 DIAGNOSIS — N401 Enlarged prostate with lower urinary tract symptoms: Secondary | ICD-10-CM | POA: Diagnosis not present

## 2020-08-28 DIAGNOSIS — R972 Elevated prostate specific antigen [PSA]: Secondary | ICD-10-CM | POA: Diagnosis not present

## 2020-08-30 MED ORDER — BETAMETHASONE DIPROPIONATE 0.05 % EX CREA: TOPICAL_CREAM | Freq: Two times a day (BID) | CUTANEOUS | 1 refills | Status: DC

## 2020-08-30 NOTE — Addendum Note (Signed)
Addended by: Earnstine Regal on: 08/30/2020 09:14 AM   Modules accepted: Orders

## 2020-08-30 NOTE — Progress Notes (Signed)
Resent to pof../lmb 

## 2020-09-08 ENCOUNTER — Other Ambulatory Visit: Payer: Self-pay | Admitting: Internal Medicine

## 2020-09-25 ENCOUNTER — Other Ambulatory Visit: Payer: Self-pay | Admitting: Internal Medicine

## 2020-10-04 ENCOUNTER — Ambulatory Visit (INDEPENDENT_AMBULATORY_CARE_PROVIDER_SITE_OTHER): Payer: PPO

## 2020-10-04 DIAGNOSIS — I469 Cardiac arrest, cause unspecified: Secondary | ICD-10-CM

## 2020-10-04 LAB — CUP PACEART REMOTE DEVICE CHECK
Battery Remaining Longevity: 48 mo
Battery Remaining Percentage: 56 %
Battery Voltage: 2.93 V
Brady Statistic AP VP Percent: 4.5 %
Brady Statistic AP VS Percent: 81 %
Brady Statistic AS VP Percent: 1 %
Brady Statistic AS VS Percent: 13 %
Brady Statistic RA Percent Paced: 82 %
Brady Statistic RV Percent Paced: 5.4 %
Date Time Interrogation Session: 20211110020015
HighPow Impedance: 72 Ohm
HighPow Impedance: 72 Ohm
Implantable Lead Implant Date: 20131218
Implantable Lead Implant Date: 20180514
Implantable Lead Location: 753859
Implantable Lead Location: 753860
Implantable Lead Model: 5076
Implantable Pulse Generator Implant Date: 20180514
Lead Channel Impedance Value: 390 Ohm
Lead Channel Impedance Value: 450 Ohm
Lead Channel Pacing Threshold Amplitude: 0.5 V
Lead Channel Pacing Threshold Amplitude: 1.25 V
Lead Channel Pacing Threshold Pulse Width: 0.5 ms
Lead Channel Pacing Threshold Pulse Width: 0.5 ms
Lead Channel Sensing Intrinsic Amplitude: 3 mV
Lead Channel Sensing Intrinsic Amplitude: 4.3 mV
Lead Channel Setting Pacing Amplitude: 2 V
Lead Channel Setting Pacing Amplitude: 2.5 V
Lead Channel Setting Pacing Pulse Width: 0.5 ms
Lead Channel Setting Sensing Sensitivity: 0.5 mV
Pulse Gen Serial Number: 1245762

## 2020-10-05 NOTE — Progress Notes (Signed)
Remote ICD transmission.   

## 2020-11-13 ENCOUNTER — Telehealth: Payer: Self-pay | Admitting: Internal Medicine

## 2020-11-13 NOTE — Telephone Encounter (Signed)
° ° ° °  Pt c/o medication issue:  1. Name of Medication:   sacubitril-valsartan (ENTRESTO) 24-26 MG    2. How are you currently taking this medication (dosage and times per day)? Take 1 tablet by mouth 2 (two) times daily.  3. Are you having a reaction (difficulty breathing--STAT)?   4. What is your medication issue? Al with [employer] calling to get prior auth for pt's entresto

## 2020-11-13 NOTE — Telephone Encounter (Signed)
**Note De-Identified  Obfuscation** I called [employer] back and s/w Jose who advised me that this call is concerning a Entresto tier exception that the pt initiated with them.  Jose transferred my call to their pharmacy and I did the tier exception with Marta Antu over the phone.  Per Marta Antu this request has been denied per Medicare part D rules as Delene Loll is already at a tier 3 and is the at the lowest tier for a name brand medication.  Marta Antu states that they will notify the pt of this denial and will explain why it was denied.

## 2020-11-14 ENCOUNTER — Other Ambulatory Visit: Payer: Self-pay

## 2020-11-14 ENCOUNTER — Encounter: Payer: Self-pay | Admitting: Internal Medicine

## 2020-11-14 ENCOUNTER — Ambulatory Visit (INDEPENDENT_AMBULATORY_CARE_PROVIDER_SITE_OTHER): Payer: PPO | Admitting: Internal Medicine

## 2020-11-14 VITALS — BP 130/72 | HR 70 | Ht 67.0 in | Wt 146.1 lb

## 2020-11-14 DIAGNOSIS — I5022 Chronic systolic (congestive) heart failure: Secondary | ICD-10-CM

## 2020-11-14 DIAGNOSIS — I10 Essential (primary) hypertension: Secondary | ICD-10-CM | POA: Diagnosis not present

## 2020-11-14 DIAGNOSIS — Z95810 Presence of automatic (implantable) cardiac defibrillator: Secondary | ICD-10-CM

## 2020-11-14 DIAGNOSIS — I4819 Other persistent atrial fibrillation: Secondary | ICD-10-CM

## 2020-11-14 DIAGNOSIS — I255 Ischemic cardiomyopathy: Secondary | ICD-10-CM

## 2020-11-14 LAB — PACEMAKER DEVICE OBSERVATION

## 2020-11-14 NOTE — Progress Notes (Signed)
Patient Care Team: Plotnikov, Georgina Quint, MD as PCP - General (Internal Medicine) Wendall Stade, MD as PCP - Cardiology (Cardiology) Duke Salvia, MD as PCP - Electrophysiology (Cardiology) Lindell Noe Lehman Prom., MD (Urology) [name], MD as Consulting Physician (General Surgery) Pyrtle, Carie Caddy, MD as Consulting Physician (Gastroenterology) [name], MD as Referring Physician (Ophthalmology)   HPI  [name] Christensen is a 86 y.o. male   seen in follow-up for ischemic cardiomyopathy with progressive left ventricular dysfunction in the context of frequent PVCs and chronic congestive heart failure.  He has persistent atrial fibrillation for which he was started on dofetilide 8/20    Have been considering AFib, Pacing and PVCs as cause of his symptoms and LV dtysfunction>> reprogramming of his device eliminated pacing ( RBBB), dofetilide >>sinus, so the PVCs remain unaddressed with failed Anti-arrhythmic drugs suppression.  Most recently tried to increase rate for overdrive suppression, with some concern about encroaching on AV conduction and triggering RV apical pacing  Family has been concerned about lassitude and depression; he thinks he is better  Denies chest pain.  No limiting shortness of breath.  No edema, nocturnal dyspnea orthopnea.  No palpitations.  On anticoagulation without bleeding.  Last hemoglobin was low (See Below)   Tolerating his medicine; some cough with Entresto.  Uses a cough drop  More recently with visiting children has been not sleeping well     DATE TEST PVC  1/15 Holter 13%  1/18 Holter 31%  2/20 ZIO 7.2% ( all one morphology)   9/20 Pacer 12%  11/20 Pacer 11%  2/21 Pacer 9.4%  9/21  13%     DATE TEST EF   12/13 Cath   Patent Grafts  2/15 Echo   45-50 %   12/17 Echo   50-55 %   4/19 Echo 20-25%   10/19 Echo  25-30%   1/20 Echo  20-25%   6/20 LHC 20-25% LIMA-LADatretic SVG-OM,SVG-D with filling of LAD,SVG-OM  patent  11/20 Echo  25-30%    Date Cr K Mg Hgb  6/20 1.14 4.9 2.0(3/20) 12.5   8/20 1.03 4.2 1.8 12.8  2/21 1.06 4.0 2.1 12.7  7/21 1.19 4.8    9/21 1.14 5.0 2.1 11.3   Antiarrhythmics Date Reason stopped  amio 7/19    dofetilide          Records and Results Reviewed *  Past Medical History:  Diagnosis Date  . Actinic keratosis 06/09/2019   L sup forehead at hairline - bx proven  . AICD (automatic cardioverter/defibrillator) present 04/07/2017  . Allergy   . Anxiety   . BPH (benign prostatic hypertrophy)    elevated PSA Dr. Lindell Noe Bx 2010  . Cardiac arrest [facility])    a. out-of-hospital arrest 11/24/2012 - EF 40-45%, patent grafts on cath, received St. Jude AICD.  [name] Carotid disease, bilateral (HCC)    a. 0-39% by doppers.  . Cataract    bil cataracts removed  . CHF (congestive heart failure) (HCC)   . Coronary artery disease    a. s/p MI/CABG 2005. b. s/p cath at time of VF arrest 10/2013 - grafts patent.  . Cough   . Diverticulosis   . Elevated LFTs    a. 10/2012 felt due to cardiac arrest - Hepatitis C Ab reactive from 10/26/2012>>Hep C RNA PCR negative 11/16/2012.   [name] GERD (gastroesophageal reflux disease)   . Hyperlipidemia   . Hypertension   . Internal hemorrhoids   .  Myocardial infarction (HCC)    2005 - CABG x 5 2005  . RBBB   . Tubular adenoma of colon   . Ventricular bigeminy    a. Event monitor 01/2013: NSR with PVCs and occ bigeminy.    Past Surgical History:  Procedure Laterality Date  . APPENDECTOMY    . CARDIAC CATHETERIZATION  2007   with patent graft anatomy atretic left internal mammary  artery to the LAD which is nonobstructive. Will restart study June 08, 2007  . CARDIOVERSION N/A 03/27/2018   Procedure: CARDIOVERSION;  Surgeon: Iran Ouch, MD;  Location: [facility] ORS;  Service: Cardiovascular;  Laterality: N/A;  . CARDIOVERSION N/A 01/04/2019   Procedure: CARDIOVERSION (CATH LAB);  Surgeon: Iran Ouch, MD;  Location: [facility] ORS;   Service: Cardiovascular;  Laterality: N/A;  . CARDIOVERSION N/A 07/07/2019   Procedure: CARDIOVERSION;  Surgeon: Wendall Stade, MD;  Location: [facility] ENDOSCOPY;  Service: Cardiovascular;  Laterality: N/A;  . CATARACT EXTRACTION W/PHACO Right 04/23/2016   Procedure: CATARACT EXTRACTION PHACO AND INTRAOCULAR LENS PLACEMENT (IOC);  Surgeon: [name], MD;  Location: [facility] ORS;  Service: Ophthalmology;  Laterality: Right;  [place] 1.09 AP% 19.3 CDE 13.38 Fluid pack lot # [id number]  . CHOLECYSTECTOMY N/A 06/05/2017   Procedure: LAPAROSCOPIC CHOLECYSTECTOMY WITH INTRAOPERATIVE CHOLANGIOGRAM ERAS PATHWAY POSSIBLE NEEDLE CORE BIOPSY OF LIVER;  Surgeon: [name], MD;  Location: [facility] OR;  Service: General;  Laterality: N/A;  ERAS PATHWAY  . COLONOSCOPY    . CORONARY ARTERY BYPASS GRAFT  2005  . EYE SURGERY    . ICD GENERATOR CHANGEOUT N/A 04/07/2017   Procedure: ICD Generator Changeout;  Surgeon: Duke Salvia, MD;  Location: [facility] INVASIVE CV LAB;  Service: Cardiovascular;  Laterality: N/A;  . IMPLANTABLE CARDIOVERTER DEFIBRILLATOR IMPLANT N/A 11/22/2012   STJ single chamber ICD implanted by Dr Graciela Husbands for cardiac arrest   . INGUINAL HERNIA REPAIR Bilateral 01/17/2015   Procedure: BILATERAL LAPAROSCOPIC INGUINAL HERNIA REPAIR WITH LEFT FEMORAL HERNIA REPAIR;  Surgeon: [name], MD;  Location: [facility] OR;  Service: General;  Laterality: Bilateral;  . INSERTION OF MESH Bilateral 01/17/2015   Procedure: INSERTION OF MESH;  Surgeon: [name], MD;  Location: [facility] OR;  Service: General;  Laterality: Bilateral;  . LAPAROSCOPIC CHOLECYSTECTOMY  06/05/2017  . LEAD INSERTION N/A 04/07/2017   Procedure: RA Lead Insertion;  Surgeon: Duke Salvia, MD;  Location: [facility] INVASIVE CV LAB;  Service: Cardiovascular;  Laterality: N/A;  . LEAD REVISION/REPAIR N/A 04/08/2017   Procedure: Atrial Lead Revision/Repair;  Surgeon: Duke Salvia, MD;  Location: [facility] INVASIVE CV LAB;  Service: Cardiovascular;  Laterality: N/A;  . LEFT  HEART CATH AND CORONARY ANGIOGRAPHY Left 05/14/2019   Procedure: LEFT HEART CATH AND CORONARY ANGIOGRAPHY;  Surgeon: Iran Ouch, MD;  Location: [facility] INVASIVE CV LAB;  Service: Cardiovascular;  Laterality: Left;  . LEFT HEART CATHETERIZATION WITH CORONARY/GRAFT ANGIOGRAM N/A 11/13/2012   Procedure: LEFT HEART CATHETERIZATION WITH Isabel Caprice;  Surgeon: [name], MD;  Location: [facility] CATH LAB;  Service: Cardiovascular;  Laterality: N/A;  . LIVER BIOPSY N/A 06/05/2017   Procedure: SINGLE SITE LIVER BIOPSY ERAS PATHWAY;  Surgeon: [name], MD;  Location: [facility] OR;  Service: General;  Laterality: N/A;  ERAS PATHWAY  . LUMBAR FUSION  09/2007    Current Meds  Medication Sig  . acetaminophen (TYLENOL) 500 MG tablet Take 500 mg by mouth at bedtime.  [name] alfuzosin (UROXATRAL) 10 MG 24 hr tablet Take 10 mg by mouth every evening.   [name]  atorvastatin (LIPITOR) 20 MG tablet TAKE 1 TABLET BY MOUTH ONCE DAILY  . betamethasone dipropionate 0.05 % cream Apply topically 2 (two) times daily.  . cholecalciferol (VITAMIN D3) 10 MCG (400 UNIT) TABS tablet Take 1 tablet (400 Units total) by mouth daily.  [name] dofetilide (TIKOSYN) 250 MCG capsule TAKE ONE CAPSULE BY MOUTH TWICE A DAY  . dutasteride (AVODART) 0.5 MG capsule Take 0.5 mg by mouth every evening.   [name] ELIQUIS 5 MG TABS tablet TAKE 1 TABLET BY MOUTH TWICE (2) DAILY  . esomeprazole (NEXIUM) 20 MG capsule Take 1 capsule (20 mg total) by mouth daily at 12 noon.  . linaclotide (LINZESS) 290 MCG CAPS capsule Take 290 mcg by mouth daily as needed.  [name] LINZESS 290 MCG CAPS capsule TAKE 1 CAPSULE BY MOUTH DAILY AS NEEDED (Patient taking differently: Take 290 mcg by mouth daily as needed (constipation).)  . loratadine (CLARITIN) 10 MG tablet Take 1 tablet (10 mg total) by mouth daily.  [name] losartan (COZAAR) 50 MG tablet Take 1 tablet (50 mg total) by mouth daily.  . magnesium oxide (MAG-OX) 400 (241.3 Mg) MG tablet TAKE 1 TABLET BY MOUTH ONCE DAILY  .  metoprolol succinate (TOPROL-XL) 50 MG 24 hr tablet Take 1 tablet (50 mg total) by mouth 2 (two) times daily. Take with or immediately following a meal.  . Multiple Vitamin (MULTIVITAMIN WITH MINERALS) TABS tablet Take 1 tablet by mouth every other day.  . nitroGLYCERIN (NITROSTAT) 0.4 MG SL tablet As directed SL  . Polyethyl Glycol-Propyl Glycol (SYSTANE OP) Place 1 drop into both eyes daily as needed (burning eyes).   . promethazine-codeine (PHENERGAN WITH CODEINE) 6.25-10 MG/5ML syrup Take 5 mLs by mouth every 6 (six) hours as needed.  . sacubitril-valsartan (ENTRESTO) 24-26 MG Take 1 tablet by mouth 2 (two) times daily.  [name] triamcinolone cream (KENALOG) 0.1 % APPLY 1 APPLICATION TOPICALLY TWO TIMES DAILY AS NEEDED AVOIDING FACE, GROIN ANDARMPITS  . vitamin B-12 (CYANOCOBALAMIN) 1000 MCG tablet Take 1,000 mcg by mouth daily.   Current Facility-Administered Medications for the 11/14/20 encounter (Office Visit) with Duke Salvia, MD  Medication  . sodium chloride flush (NS) 0.9 % injection 3 mL   Current Outpatient Medications on File Prior to Visit  Medication Sig Dispense Refill  . acetaminophen (TYLENOL) 500 MG tablet Take 500 mg by mouth at bedtime.    [name] alfuzosin (UROXATRAL) 10 MG 24 hr tablet Take 10 mg by mouth every evening.     [name] atorvastatin (LIPITOR) 20 MG tablet TAKE 1 TABLET BY MOUTH ONCE DAILY 90 tablet 3  . betamethasone dipropionate 0.05 % cream Apply topically 2 (two) times daily. 30 g 1  . cholecalciferol (VITAMIN D3) 10 MCG (400 UNIT) TABS tablet Take 1 tablet (400 Units total) by mouth daily. 30 tablet 11  . dofetilide (TIKOSYN) 250 MCG capsule TAKE ONE CAPSULE BY MOUTH TWICE A DAY 180 capsule 3  . dutasteride (AVODART) 0.5 MG capsule Take 0.5 mg by mouth every evening.     [name] ELIQUIS 5 MG TABS tablet TAKE 1 TABLET BY MOUTH TWICE (2) DAILY 180 tablet 3  . esomeprazole (NEXIUM) 20 MG capsule Take 1 capsule (20 mg total) by mouth daily at 12 noon. 30 capsule 3  .  linaclotide (LINZESS) 290 MCG CAPS capsule Take 290 mcg by mouth daily as needed.    [name] LINZESS 290 MCG CAPS capsule TAKE 1 CAPSULE BY MOUTH DAILY AS NEEDED (Patient taking differently: Take 290 mcg by mouth daily  as needed (constipation).) 30 capsule 11  . loratadine (CLARITIN) 10 MG tablet Take 1 tablet (10 mg total) by mouth daily. 30 tablet 11  . losartan (COZAAR) 50 MG tablet Take 1 tablet (50 mg total) by mouth daily. 90 tablet 3  . magnesium oxide (MAG-OX) 400 (241.3 Mg) MG tablet TAKE 1 TABLET BY MOUTH ONCE DAILY 30 tablet 6  . metoprolol succinate (TOPROL-XL) 50 MG 24 hr tablet Take 1 tablet (50 mg total) by mouth 2 (two) times daily. Take with or immediately following a meal. 180 tablet 3  . Multiple Vitamin (MULTIVITAMIN WITH MINERALS) TABS tablet Take 1 tablet by mouth every other day.    . nitroGLYCERIN (NITROSTAT) 0.4 MG SL tablet As directed SL 25 tablet 3  . Polyethyl Glycol-Propyl Glycol (SYSTANE OP) Place 1 drop into both eyes daily as needed (burning eyes).     . promethazine-codeine (PHENERGAN WITH CODEINE) 6.25-10 MG/5ML syrup Take 5 mLs by mouth every 6 (six) hours as needed. 300 mL 0  . sacubitril-valsartan (ENTRESTO) 24-26 MG Take 1 tablet by mouth 2 (two) times daily. 60 tablet 6  . triamcinolone cream (KENALOG) 0.1 % APPLY 1 APPLICATION TOPICALLY TWO TIMES DAILY AS NEEDED AVOIDING FACE, GROIN ANDARMPITS 80 g 0  . vitamin B-12 (CYANOCOBALAMIN) 1000 MCG tablet Take 1,000 mcg by mouth daily.     Current Facility-Administered Medications on File Prior to Visit  Medication Dose Route Frequency Provider Last Rate Last Admin  . sodium chloride flush (NS) 0.9 % injection 3 mL  3 mL Intravenous Q12H Duke Salvia, MD         Allergies  Allergen Reactions  . Ezetimibe Other (See Comments)    Pt was in coma for 6 days, numbness  . Penicillins Other (See Comments)    BLISTERS BETWEEN FINGERS Did it involve swelling of the face/tongue/throat, SOB, or low BP? No Did it involve  sudden or severe rash/hives, skin peeling, or any reaction on the inside of your mouth or nose? Yes Did you need to seek medical attention at a hospital or doctor's office? No When did it last happen?60 years If all above answers are "NO", may proceed with cephalosporin use.   . Pravastatin Sodium Other (See Comments)    Aches and pains in joints  . Rosuvastatin Other (See Comments)    MYALGIAS  . Niacin Anxiety and Other (See Comments)    "Makes me feel nervous, jittery"      Review of Systems negative except from HPI and PMH  Physical Exam BP 130/72 (BP Location: Left Arm, Patient Position: Sitting, Cuff Size: Normal)   Ht 5\' 7"  (1.702 m)   Wt 146 lb 2 oz (66.3 kg)   SpO2 98%   BMI 22.89 kg/m  Well developed and well nourished in no acute distress HENT normal Neck supple with JVP-flat Clear Device pocket well healed; without hematoma or erythema.  There is no tethering  Irregular rate and rhythm, no  murmur Abd-soft with active BS No Clubbing cyanosis  edema Skin-warm and dry A & Oriented  Grossly normal sensory and motor function  ECG atrial pacing with intrinsic conduction in the right bundle branch block pattern QRS duration 160 ms.  PVCs-frequent right bundle branch block superior axis in a pattern of trigeminy      ASSESSMENT & PLAN:    Atrial fibrillation/flutter-persistent  PVCs-right bundle branch superior axis --identified by his device by ventricular rates slower than paced rate of 70  Cardiomyopathy recurrent  History  of aborted sudden cardiac death    Implantable defibrillator-St. Jude   upgraded single-dual-chamber May 2018  The patient's device was interrogated.  The information was reviewed. No changes were made in the programming.    Ischemic cardiomyopathy with prior CABG    Congestive Heart Failure chronic systolic  High Risk Medication Surveillance,Dofetilide  Right bundle branch block.  Anemia  Sinus  bradycardia/chronotropic incompetence   Scant intercurrent atrial fibrillation  Reasonable heart rate excursion.  About 35% of his ventricular beats are less than 70 bpm which I suspect represents the intervals associated with the compensatory pause following PVCs suggesting that the PVC burden remains fairly stable at trigeminy  Concern would be aggravation of his cardiomyopathy.  From a functional standpoint, it a little bit hard to tell as he is such an optimistic person and denies symptoms, but he seems to be stable.  We will also look at his echocardiogram to assess.  Not withstanding his age would consider referral for catheter ablation  Dofetilide surveillance laboratories were within range 9/21  Bothered by the delta hemoglobin most recently measured 9/21.  Will recheck today.  Euvolemic continue current meds              .  Sherryl Manges, MD  11/14/2020 10:28 AM     [facility] [address] [place] [address] [phone] (office) [phone] (fax) yes.

## 2020-11-14 NOTE — Patient Instructions (Addendum)
Medication Instructions:  - Your physician recommends that you continue on your current medications as directed. Please refer to the Current Medication list given to you today.  *If you need a refill on your cardiac medications before your next appointment, please call your pharmacy*   Lab Work: - Your physician recommends that you have lab work today: CBC  If you have labs (blood work) drawn today and your tests are completely normal, you will receive your results only by: [name] MyChart Message (if you have MyChart) OR . A paper copy in the mail If you have any lab test that is abnormal or we need to change your treatment, we will call you to review the results.   Testing/Procedures: - Your physician has requested that you have an echocardiogram in 4 months (just prior to your next appointment with Dr. [name]). Echocardiography is a painless test that uses sound waves to create images of your heart. It provides your doctor with information about the size and shape of your heart and how well your heart's chambers and valves are working. This procedure takes approximately one hour. There are no restrictions for this procedure. There is a possibility that an IV may need to be started during your test to inject an image enhancing agent. This is done to obtain more optimal pictures of your heart. Therefore we ask that you do at least drink some water prior to coming in to hydrate your veins.      Follow-Up: At [facility], you and your health needs are our priority.  As part of our continuing mission to provide you with exceptional heart care, we have created designated Provider Care Teams.  These Care Teams include your primary Cardiologist (physician) and Advanced Practice Providers (APPs -  Physician Assistants and Nurse Practitioners) who all work together to provide you with the care you need, when you need it.  We recommend signing up for the patient portal called "MyChart".  Sign up  information is provided on this After Visit Summary.  MyChart is used to connect with patients for Virtual Visits (Telemedicine).  Patients are able to view lab/test results, encounter notes, upcoming appointments, etc.  Non-urgent messages can be sent to your provider as well.   To learn more about what you can do with MyChart, go to [email or web address].    Your next appointment:   4 month(s)  The format for your next appointment:   In Person  Provider:   Virl Axe, MD   Other Instructions n/a

## 2020-11-15 ENCOUNTER — Telehealth: Payer: Self-pay | Admitting: Internal Medicine

## 2020-11-15 LAB — CBC WITH DIFFERENTIAL/PLATELET
Basophils Absolute: 0 10*3/uL (ref 0.0–0.2)
Basos: 1 %
EOS (ABSOLUTE): 0.2 10*3/uL (ref 0.0–0.4)
Eos: 3 %
Hematocrit: 37.7 % (ref 37.5–51.0)
Hemoglobin: 12.6 g/dL — ABNORMAL LOW (ref 13.0–17.7)
Immature Grans (Abs): 0 10*3/uL (ref 0.0–0.1)
Immature Granulocytes: 1 %
Lymphocytes Absolute: 1.8 10*3/uL (ref 0.7–3.1)
Lymphs: 28 %
MCH: 31.2 pg (ref 26.6–33.0)
MCHC: 33.4 g/dL (ref 31.5–35.7)
MCV: 93 fL (ref 79–97)
Monocytes Absolute: 0.5 10*3/uL (ref 0.1–0.9)
Monocytes: 7 %
Neutrophils Absolute: 4 10*3/uL (ref 1.4–7.0)
Neutrophils: 60 %
Platelets: 130 10*3/uL — ABNORMAL LOW (ref 150–450)
RBC: 4.04 x10E6/uL — ABNORMAL LOW (ref 4.14–5.80)
RDW: 11.8 % (ref 11.6–15.4)
WBC: 6.6 10*3/uL (ref 3.4–10.8)

## 2020-11-15 LAB — CUP PACEART INCLINIC DEVICE CHECK
Brady Statistic RA Percent Paced: 82 %
Brady Statistic RV Percent Paced: 4 %
Date Time Interrogation Session: 20211221085433
HighPow Impedance: 70 Ohm
Implantable Lead Implant Date: 20131218
Implantable Lead Implant Date: 20180514
Implantable Lead Location: 753859
Implantable Lead Location: 753860
Implantable Lead Model: 5076
Implantable Pulse Generator Implant Date: 20180514
Lead Channel Impedance Value: 400 Ohm
Lead Channel Impedance Value: 460 Ohm
Lead Channel Pacing Threshold Amplitude: 0.5 V
Lead Channel Pacing Threshold Amplitude: 1.25 V
Lead Channel Pacing Threshold Pulse Width: 0.5 ms
Lead Channel Pacing Threshold Pulse Width: 0.5 ms
Lead Channel Sensing Intrinsic Amplitude: 3.3 mV
Lead Channel Sensing Intrinsic Amplitude: 3.8 mV
Lead Channel Setting Pacing Amplitude: 2 V
Lead Channel Setting Pacing Amplitude: 2.5 V
Lead Channel Setting Pacing Pulse Width: 0.5 ms
Lead Channel Setting Sensing Sensitivity: 0.5 mV
Pulse Gen Serial Number: 1245762

## 2020-11-15 NOTE — Telephone Encounter (Signed)
I called and spoke with the patient regarding his CBC results. I advised these have not been officially signed off by Dr. [name], but look better than what they have been over the last year.   The patient was very appreciative for the call back.

## 2020-11-15 NOTE — Telephone Encounter (Signed)
Patient calling to discuss recent lab testing results  ° °Please call  ° °

## 2020-12-14 DIAGNOSIS — M25512 Pain in left shoulder: Secondary | ICD-10-CM | POA: Diagnosis not present

## 2020-12-25 ENCOUNTER — Ambulatory Visit: Payer: PPO | Admitting: Internal Medicine

## 2021-01-01 ENCOUNTER — Ambulatory Visit (INDEPENDENT_AMBULATORY_CARE_PROVIDER_SITE_OTHER): Payer: PPO | Admitting: Internal Medicine

## 2021-01-01 ENCOUNTER — Other Ambulatory Visit: Payer: Self-pay

## 2021-01-01 ENCOUNTER — Encounter: Payer: Self-pay | Admitting: Internal Medicine

## 2021-01-01 DIAGNOSIS — I2510 Atherosclerotic heart disease of native coronary artery without angina pectoris: Secondary | ICD-10-CM | POA: Diagnosis not present

## 2021-01-01 DIAGNOSIS — E785 Hyperlipidemia, unspecified: Secondary | ICD-10-CM | POA: Diagnosis not present

## 2021-01-01 DIAGNOSIS — N183 Chronic kidney disease, stage 3 unspecified: Secondary | ICD-10-CM

## 2021-01-01 DIAGNOSIS — K5900 Constipation, unspecified: Secondary | ICD-10-CM

## 2021-01-01 DIAGNOSIS — I255 Ischemic cardiomyopathy: Secondary | ICD-10-CM

## 2021-01-01 DIAGNOSIS — I480 Paroxysmal atrial fibrillation: Secondary | ICD-10-CM | POA: Diagnosis not present

## 2021-01-01 LAB — CBC WITH DIFFERENTIAL/PLATELET
Basophils Absolute: 0 10*3/uL (ref 0.0–0.1)
Basophils Relative: 0.3 % (ref 0.0–3.0)
Eosinophils Absolute: 0.2 10*3/uL (ref 0.0–0.7)
Eosinophils Relative: 2.8 % (ref 0.0–5.0)
HCT: 38 % — ABNORMAL LOW (ref 39.0–52.0)
Hemoglobin: 12.8 g/dL — ABNORMAL LOW (ref 13.0–17.0)
Lymphocytes Relative: 21.9 % (ref 12.0–46.0)
Lymphs Abs: 1.4 10*3/uL (ref 0.7–4.0)
MCHC: 33.6 g/dL (ref 30.0–36.0)
MCV: 92.2 fl (ref 78.0–100.0)
Monocytes Absolute: 0.5 10*3/uL (ref 0.1–1.0)
Monocytes Relative: 7.5 % (ref 3.0–12.0)
Neutro Abs: 4.3 10*3/uL (ref 1.4–7.7)
Neutrophils Relative %: 67.5 % (ref 43.0–77.0)
Platelets: 121 10*3/uL — ABNORMAL LOW (ref 150.0–400.0)
RBC: 4.12 Mil/uL — ABNORMAL LOW (ref 4.22–5.81)
RDW: 12.7 % (ref 11.5–15.5)
WBC: 6.4 10*3/uL (ref 4.0–10.5)

## 2021-01-01 LAB — COMPREHENSIVE METABOLIC PANEL
ALT: 10 U/L (ref 0–53)
AST: 21 U/L (ref 0–37)
Albumin: 3.9 g/dL (ref 3.5–5.2)
Alkaline Phosphatase: 82 U/L (ref 39–117)
BUN: 20 mg/dL (ref 6–23)
CO2: 31 mEq/L (ref 19–32)
Calcium: 11.5 mg/dL — ABNORMAL HIGH (ref 8.4–10.5)
Chloride: 102 mEq/L (ref 96–112)
Creatinine, Ser: 1.22 mg/dL (ref 0.40–1.50)
GFR: 53.79 mL/min — ABNORMAL LOW (ref >60.00)
Glucose, Bld: 101 mg/dL — ABNORMAL HIGH (ref 70–99)
Potassium: 5.3 mEq/L — ABNORMAL HIGH (ref 3.5–5.1)
Sodium: 134 mEq/L — ABNORMAL LOW (ref 135–145)
Total Bilirubin: 0.5 mg/dL (ref 0.2–1.2)
Total Protein: 7.6 g/dL (ref 6.0–8.3)

## 2021-01-01 LAB — VITAMIN D 25 HYDROXY (VIT D DEFICIENCY, FRACTURES): VITD: 56.77 ng/mL (ref 30.00–100.00)

## 2021-01-01 MED ORDER — LINACLOTIDE 290 MCG PO CAPS
290.0000 ug | ORAL_CAPSULE | Freq: Every day | ORAL | 3 refills | Status: DC | PRN
Start: 2021-01-01 — End: 2022-04-02

## 2021-01-01 NOTE — Assessment & Plan Note (Signed)
GFR 58 CRI st 3a Check CMET

## 2021-01-01 NOTE — Assessment & Plan Note (Signed)
On Lipitor 

## 2021-01-01 NOTE — Assessment & Plan Note (Signed)
Cont w/Atenolol, ASA, Plavix, Lipitor, Losartan

## 2021-01-01 NOTE — Assessment & Plan Note (Signed)
Now on [facility]

## 2021-01-01 NOTE — Addendum Note (Signed)
Addended by: Raliegh Ip on: 01/01/2021 11:47 AM   Modules accepted: Orders

## 2021-01-01 NOTE — Assessment & Plan Note (Signed)
PTH, calcium, Vit D

## 2021-01-01 NOTE — Assessment & Plan Note (Signed)
Cont w/Linzess

## 2021-01-01 NOTE — Progress Notes (Signed)
Subjective:  Patient ID: [name] Christensen, male    DOB: 29-Sep-1932  Age: 87 y.o. MRN: [id number]  CC: Follow-up (6 month f/u- Req refill on his Linzess)   HPI [name] Christensen presents for CAD, dyslipidemia, constipation f/u  Outpatient Medications Prior to Visit  Medication Sig Dispense Refill  . acetaminophen (TYLENOL) 500 MG tablet Take 500 mg by mouth at bedtime.    [name] alfuzosin (UROXATRAL) 10 MG 24 hr tablet Take 10 mg by mouth every evening.     [name] atorvastatin (LIPITOR) 20 MG tablet TAKE 1 TABLET BY MOUTH ONCE DAILY 90 tablet 3  . betamethasone dipropionate 0.05 % cream Apply topically 2 (two) times daily. 30 g 1  . cholecalciferol (VITAMIN D3) 10 MCG (400 UNIT) TABS tablet Take 1 tablet (400 Units total) by mouth daily. 30 tablet 11  . dofetilide (TIKOSYN) 250 MCG capsule TAKE ONE CAPSULE BY MOUTH TWICE A DAY 180 capsule 3  . dutasteride (AVODART) 0.5 MG capsule Take 0.5 mg by mouth every evening.     [name] ELIQUIS 5 MG TABS tablet TAKE 1 TABLET BY MOUTH TWICE (2) DAILY 180 tablet 3  . esomeprazole (NEXIUM) 20 MG capsule Take 1 capsule (20 mg total) by mouth daily at 12 noon. 30 capsule 3  . LINZESS 290 MCG CAPS capsule TAKE 1 CAPSULE BY MOUTH DAILY AS NEEDED (Patient taking differently: Take 290 mcg by mouth daily as needed (constipation).) 30 capsule 11  . loratadine (CLARITIN) 10 MG tablet Take 1 tablet (10 mg total) by mouth daily. 30 tablet 11  . losartan (COZAAR) 50 MG tablet Take 1 tablet (50 mg total) by mouth daily. 90 tablet 3  . magnesium oxide (MAG-OX) 400 (241.3 Mg) MG tablet TAKE 1 TABLET BY MOUTH ONCE DAILY 30 tablet 6  . metoprolol succinate (TOPROL-XL) 50 MG 24 hr tablet Take 1 tablet (50 mg total) by mouth 2 (two) times daily. Take with or immediately following a meal. 180 tablet 3  . Multiple Vitamin (MULTIVITAMIN WITH MINERALS) TABS tablet Take 1 tablet by mouth every other day.    . nitroGLYCERIN (NITROSTAT) 0.4 MG SL tablet As directed SL 25 tablet 3  . Polyethyl  Glycol-Propyl Glycol (SYSTANE OP) Place 1 drop into both eyes daily as needed (burning eyes).     . promethazine-codeine (PHENERGAN WITH CODEINE) 6.25-10 MG/5ML syrup Take 5 mLs by mouth every 6 (six) hours as needed. 300 mL 0  . sacubitril-valsartan (ENTRESTO) 24-26 MG Take 1 tablet by mouth 2 (two) times daily. 60 tablet 6  . triamcinolone cream (KENALOG) 0.1 % APPLY 1 APPLICATION TOPICALLY TWO TIMES DAILY AS NEEDED AVOIDING FACE, GROIN ANDARMPITS 80 g 0  . vitamin B-12 (CYANOCOBALAMIN) 1000 MCG tablet Take 1,000 mcg by mouth daily.    [name] linaclotide (LINZESS) 290 MCG CAPS capsule Take 290 mcg by mouth daily as needed.    . sodium chloride flush (NS) 0.9 % injection 3 mL      No facility-administered medications prior to visit.    ROS: Review of Systems  Constitutional: Negative for appetite change, fatigue and unexpected weight change.  HENT: Negative for congestion, nosebleeds, sneezing, sore throat and trouble swallowing.   Eyes: Negative for itching and visual disturbance.  Respiratory: Negative for cough.   Cardiovascular: Negative for chest pain, palpitations and leg swelling.  Gastrointestinal: Positive for constipation. Negative for abdominal distention, blood in stool, diarrhea and nausea.  Genitourinary: Negative for frequency and hematuria.  Musculoskeletal: Positive for arthralgias. Negative for back  pain, gait problem, joint swelling and neck pain.  Skin: Negative for rash.  Neurological: Negative for dizziness, tremors, speech difficulty and weakness.  Psychiatric/Behavioral: Negative for agitation, dysphoric mood and sleep disturbance. The patient is not nervous/anxious.     Objective:  BP (!) 142/70 (BP Location: Left Arm)   Pulse 75   Temp 97.8 F (36.6 C) (Oral)   Ht 5\' 7"  (1.702 m)   Wt 145 lb 3.2 oz (65.9 kg)   SpO2 97%   BMI 22.74 kg/m   BP Readings from Last 3 Encounters:  01/01/21 (!) 142/70  11/14/20 130/72  08/15/20 130/66    Wt Readings from Last  3 Encounters:  01/01/21 145 lb 3.2 oz (65.9 kg)  11/14/20 146 lb 2 oz (66.3 kg)  08/15/20 146 lb 8 oz (66.5 kg)    Physical Exam Constitutional:      General: He is not in acute distress.    Appearance: He is well-developed.     Comments: NAD  HENT:     Mouth/Throat:     Mouth: Oropharynx is clear and moist.  Eyes:     Conjunctiva/sclera: Conjunctivae normal.     Pupils: Pupils are equal, round, and reactive to light.  Neck:     Thyroid: No thyromegaly.     Vascular: No JVD.  Cardiovascular:     Rate and Rhythm: Normal rate and regular rhythm.     Pulses: Intact distal pulses.     Heart sounds: Normal heart sounds. No murmur heard. No friction rub. No gallop.   Pulmonary:     Effort: Pulmonary effort is normal. No respiratory distress.     Breath sounds: Normal breath sounds. No wheezing or rales.  Chest:     Chest wall: No tenderness.  Abdominal:     General: Bowel sounds are normal. There is no distension.     Palpations: Abdomen is soft. There is no mass.     Tenderness: There is no abdominal tenderness. There is no guarding or rebound.  Musculoskeletal:        General: No tenderness or edema. Normal range of motion.     Cervical back: Normal range of motion.  Lymphadenopathy:     Cervical: No cervical adenopathy.  Skin:    General: Skin is warm and dry.     Findings: No rash.  Neurological:     Mental Status: He is alert and oriented to person, place, and time.     Cranial Nerves: No cranial nerve deficit.     Motor: No abnormal muscle tone.     Coordination: He displays a negative Romberg sign. Coordination normal.     Gait: Gait normal.     Deep Tendon Reflexes: Reflexes are normal and symmetric.  Psychiatric:        Mood and Affect: Mood and affect normal.        Behavior: Behavior normal.        Thought Content: Thought content normal.        Judgment: Judgment normal.   R shoulder - pain w/ROM  Lab Results  Component Value Date   WBC 6.6 11/14/2020    HGB 12.6 (L) 11/14/2020   HCT 37.7 11/14/2020   PLT 130 (L) 11/14/2020   GLUCOSE 93 08/15/2020   CHOL 107 12/30/2019   TRIG 102.0 12/30/2019   HDL 46.80 12/30/2019   LDLCALC 39 12/30/2019   ALT 11 06/21/2020   AST 23 06/21/2020   NA 131 (L) 08/15/2020   K  5.0 08/15/2020   CL 95 (L) 08/15/2020   CREATININE 1.14 08/15/2020   BUN 14 08/15/2020   CO2 25 08/15/2020   TSH 2.51 12/30/2019   PSA 12.45 (H) 02/06/2016   INR 1.1 04/02/2017   HGBA1C 6.1 12/30/2019    No results found.  Assessment & Plan:    Walker Kehr, MD

## 2021-01-01 NOTE — Assessment & Plan Note (Signed)
Cont w/Eliquis, Toprol, Tikosyn

## 2021-01-03 ENCOUNTER — Ambulatory Visit (INDEPENDENT_AMBULATORY_CARE_PROVIDER_SITE_OTHER): Payer: PPO

## 2021-01-03 DIAGNOSIS — I469 Cardiac arrest, cause unspecified: Secondary | ICD-10-CM

## 2021-01-03 LAB — PTH, INTACT AND CALCIUM
Calcium: 11.1 mg/dL — ABNORMAL HIGH (ref 8.6–10.3)
PTH: 48 pg/mL (ref 14–64)

## 2021-01-05 DIAGNOSIS — S40012A Contusion of left shoulder, initial encounter: Secondary | ICD-10-CM | POA: Diagnosis not present

## 2021-01-05 LAB — CUP PACEART REMOTE DEVICE CHECK
Battery Remaining Longevity: 46 mo
Battery Remaining Percentage: 53 %
Battery Voltage: 2.93 V
Brady Statistic AP VP Percent: 1.1 %
Brady Statistic AP VS Percent: 80 %
Brady Statistic AS VP Percent: 1 %
Brady Statistic AS VS Percent: 18 %
Brady Statistic RA Percent Paced: 78 %
Brady Statistic RV Percent Paced: 1.8 %
Date Time Interrogation Session: 20220208095443
HighPow Impedance: 64 Ohm
HighPow Impedance: 64 Ohm
Implantable Lead Implant Date: 20131218
Implantable Lead Implant Date: 20180514
Implantable Lead Location: 753859
Implantable Lead Location: 753860
Implantable Lead Model: 5076
Implantable Pulse Generator Implant Date: 20180514
Lead Channel Impedance Value: 380 Ohm
Lead Channel Impedance Value: 450 Ohm
Lead Channel Pacing Threshold Amplitude: 0.5 V
Lead Channel Pacing Threshold Amplitude: 1 V
Lead Channel Pacing Threshold Pulse Width: 0.5 ms
Lead Channel Pacing Threshold Pulse Width: 0.5 ms
Lead Channel Sensing Intrinsic Amplitude: 3.5 mV
Lead Channel Sensing Intrinsic Amplitude: 4.4 mV
Lead Channel Setting Pacing Amplitude: 2 V
Lead Channel Setting Pacing Amplitude: 2.5 V
Lead Channel Setting Pacing Pulse Width: 0.5 ms
Lead Channel Setting Sensing Sensitivity: 0.5 mV
Pulse Gen Serial Number: 1245762

## 2021-01-07 DIAGNOSIS — S40012A Contusion of left shoulder, initial encounter: Secondary | ICD-10-CM | POA: Insufficient documentation

## 2021-01-09 NOTE — Progress Notes (Signed)
Remote ICD transmission.   

## 2021-01-14 ENCOUNTER — Other Ambulatory Visit: Payer: Self-pay | Admitting: Internal Medicine

## 2021-01-14 MED ORDER — LOSARTAN POTASSIUM 50 MG PO TABS
25.0000 mg | ORAL_TABLET | Freq: Every day | ORAL | 3 refills | Status: DC
Start: 2021-01-14 — End: 2021-04-17

## 2021-01-16 ENCOUNTER — Telehealth: Payer: Self-pay | Admitting: Internal Medicine

## 2021-01-16 NOTE — Telephone Encounter (Signed)
Noted! Thank you

## 2021-01-16 NOTE — Telephone Encounter (Signed)
Patients wife calling, states the patient has been having issues with his memory and his hearing. Their children have been pushing for the patient to come to the office to be evaluated for these things but do not want him to know that's why he is being evaluated because he will get very angry with them. I told her that I would let Dr. Alain Marion know but i'm unsure what to do about the situation. She is taking him to the lab on Friday to get blood work done so she made an appointment on 03.02.22 and is telling him she is just bringing him for a follow up on the blood work.

## 2021-01-19 ENCOUNTER — Other Ambulatory Visit (INDEPENDENT_AMBULATORY_CARE_PROVIDER_SITE_OTHER): Payer: PPO

## 2021-01-19 LAB — COMPREHENSIVE METABOLIC PANEL
ALT: 12 U/L (ref 0–53)
AST: 24 U/L (ref 0–37)
Albumin: 3.9 g/dL (ref 3.5–5.2)
Alkaline Phosphatase: 84 U/L (ref 39–117)
BUN: 17 mg/dL (ref 6–23)
CO2: 31 mEq/L (ref 19–32)
Calcium: 11.1 mg/dL — ABNORMAL HIGH (ref 8.4–10.5)
Chloride: 99 mEq/L (ref 96–112)
Creatinine, Ser: 1.1 mg/dL (ref 0.40–1.50)
GFR: 60.88 mL/min (ref >60.00)
Glucose, Bld: 107 mg/dL — ABNORMAL HIGH (ref 70–99)
Potassium: 4.9 mEq/L (ref 3.5–5.1)
Sodium: 134 mEq/L — ABNORMAL LOW (ref 135–145)
Total Bilirubin: 0.5 mg/dL (ref 0.2–1.2)
Total Protein: 7.3 g/dL (ref 6.0–8.3)

## 2021-01-19 NOTE — Telephone Encounter (Signed)
Patient also wanted to include that there is some paranoia

## 2021-01-23 LAB — PTH-RELATED PEPTIDE

## 2021-01-23 LAB — EXTRA SPECIMEN

## 2021-01-23 LAB — VITAMIN A: Vitamin A (Retinoic Acid): 39 ug/dL (ref 38–98)

## 2021-01-24 ENCOUNTER — Encounter: Payer: Self-pay | Admitting: Internal Medicine

## 2021-01-24 ENCOUNTER — Other Ambulatory Visit: Payer: Self-pay

## 2021-01-24 ENCOUNTER — Ambulatory Visit (INDEPENDENT_AMBULATORY_CARE_PROVIDER_SITE_OTHER): Payer: PPO | Admitting: Internal Medicine

## 2021-01-24 ENCOUNTER — Ambulatory Visit (INDEPENDENT_AMBULATORY_CARE_PROVIDER_SITE_OTHER): Payer: PPO

## 2021-01-24 DIAGNOSIS — R059 Cough, unspecified: Secondary | ICD-10-CM

## 2021-01-24 DIAGNOSIS — K5900 Constipation, unspecified: Secondary | ICD-10-CM

## 2021-01-24 DIAGNOSIS — N183 Chronic kidney disease, stage 3 unspecified: Secondary | ICD-10-CM | POA: Diagnosis not present

## 2021-01-24 DIAGNOSIS — R053 Chronic cough: Secondary | ICD-10-CM | POA: Diagnosis not present

## 2021-01-24 LAB — SEDIMENTATION RATE: Sed Rate: 20 mm/hr (ref 0–20)

## 2021-01-24 NOTE — Assessment & Plan Note (Addendum)
Unclear etiology.  He is asymptomatic with exception of probable decreased focus.  Will obtain a CXR. Ordered SPEP, PTH,PTH rp D/c magnesium Am not sure what is going on.  We will obtain an endocrinology consultation-referral was made.

## 2021-01-24 NOTE — Progress Notes (Signed)
Subjective:  Patient ID: [name] Christensen, male    DOB: 02-07-1932  Age: 87 y.o. MRN: [id number]  CC: Follow-up (Family is concern about memory issues & hearing-Pt is unaware appt was made for these problems )   HPI [name] Christensen presents for hypercalcemia of unknown etiology.  He is not on any calcium supplements.  He takes magnesium to help his bowel regularity.  His family noticed that he is not as sharp mentally as he used to be, however, he had a lot of stress with a realistic transaction this weekend.  He is here in the office with his son.  Outpatient Medications Prior to Visit  Medication Sig Dispense Refill  . acetaminophen (TYLENOL) 500 MG tablet Take 500 mg by mouth at bedtime.    [name] alfuzosin (UROXATRAL) 10 MG 24 hr tablet Take 10 mg by mouth every evening.     [name] atorvastatin (LIPITOR) 20 MG tablet TAKE 1 TABLET BY MOUTH ONCE DAILY 90 tablet 3  . betamethasone dipropionate 0.05 % cream Apply topically 2 (two) times daily. 30 g 1  . cholecalciferol (VITAMIN D3) 10 MCG (400 UNIT) TABS tablet Take 1 tablet (400 Units total) by mouth daily. 30 tablet 11  . dofetilide (TIKOSYN) 250 MCG capsule TAKE ONE CAPSULE BY MOUTH TWICE A DAY 180 capsule 3  . dutasteride (AVODART) 0.5 MG capsule Take 0.5 mg by mouth every evening.     [name] ELIQUIS 5 MG TABS tablet TAKE 1 TABLET BY MOUTH TWICE (2) DAILY 180 tablet 3  . esomeprazole (NEXIUM) 20 MG capsule Take 1 capsule (20 mg total) by mouth daily at 12 noon. 30 capsule 3  . linaclotide (LINZESS) 290 MCG CAPS capsule Take 1 capsule (290 mcg total) by mouth daily as needed (constipation). 90 capsule 3  . loratadine (CLARITIN) 10 MG tablet Take 1 tablet (10 mg total) by mouth daily. 30 tablet 11  . losartan (COZAAR) 50 MG tablet Take 0.5 tablets (25 mg total) by mouth daily. 90 tablet 3  . magnesium oxide (MAG-OX) 400 (241.3 Mg) MG tablet TAKE 1 TABLET BY MOUTH ONCE DAILY 30 tablet 6  . metoprolol succinate (TOPROL-XL) 50 MG 24 hr tablet Take 1 tablet  (50 mg total) by mouth 2 (two) times daily. Take with or immediately following a meal. 180 tablet 3  . Multiple Vitamin (MULTIVITAMIN WITH MINERALS) TABS tablet Take 1 tablet by mouth every other day.    . nitroGLYCERIN (NITROSTAT) 0.4 MG SL tablet As directed SL 25 tablet 3  . Polyethyl Glycol-Propyl Glycol (SYSTANE OP) Place 1 drop into both eyes daily as needed (burning eyes).     . sacubitril-valsartan (ENTRESTO) 24-26 MG Take 1 tablet by mouth 2 (two) times daily. 60 tablet 6  . triamcinolone cream (KENALOG) 0.1 % APPLY 1 APPLICATION TOPICALLY TWO TIMES DAILY AS NEEDED AVOIDING FACE, GROIN ANDARMPITS 80 g 0  . vitamin B-12 (CYANOCOBALAMIN) 1000 MCG tablet Take 1,000 mcg by mouth daily.     No facility-administered medications prior to visit.    ROS: Review of Systems  Constitutional: Positive for fatigue. Negative for appetite change and unexpected weight change.  HENT: Negative for congestion, nosebleeds, sneezing, sore throat and trouble swallowing.   Eyes: Negative for itching and visual disturbance.  Respiratory: Negative for cough.   Cardiovascular: Negative for chest pain, palpitations and leg swelling.  Gastrointestinal: Negative for abdominal distention, blood in stool, diarrhea and nausea.  Genitourinary: Negative for frequency and hematuria.  Musculoskeletal: Negative for back pain,  gait problem, joint swelling and neck pain.  Skin: Negative for rash.  Neurological: Negative for dizziness, tremors, speech difficulty and weakness.  Psychiatric/Behavioral: Positive for decreased concentration. Negative for agitation, confusion, dysphoric mood and sleep disturbance. The patient is not nervous/anxious.     Objective:  BP (!) 152/70 (BP Location: Left Arm)   Pulse 68   Temp 98.2 F (36.8 C) (Oral)   Ht 5\' 7"  (1.702 m)   Wt 143 lb (64.9 kg)   SpO2 96%   BMI 22.40 kg/m   BP Readings from Last 3 Encounters:  01/24/21 (!) 152/70  01/01/21 (!) 142/70  11/14/20 130/72     Wt Readings from Last 3 Encounters:  01/24/21 143 lb (64.9 kg)  01/01/21 145 lb 3.2 oz (65.9 kg)  11/14/20 146 lb 2 oz (66.3 kg)    Physical Exam Constitutional:      General: He is not in acute distress.    Appearance: He is well-developed.     Comments: NAD  HENT:     Mouth/Throat:     Mouth: Oropharynx is clear and moist.  Eyes:     Conjunctiva/sclera: Conjunctivae normal.     Pupils: Pupils are equal, round, and reactive to light.  Neck:     Thyroid: No thyromegaly.     Vascular: No JVD.  Cardiovascular:     Rate and Rhythm: Normal rate and regular rhythm.     Pulses: Intact distal pulses.     Heart sounds: Normal heart sounds. No murmur heard. No friction rub. No gallop.   Pulmonary:     Effort: Pulmonary effort is normal. No respiratory distress.     Breath sounds: Normal breath sounds. No wheezing or rales.  Chest:     Chest wall: No tenderness.  Abdominal:     General: Bowel sounds are normal. There is no distension.     Palpations: Abdomen is soft. There is no mass.     Tenderness: There is no abdominal tenderness. There is no guarding or rebound.  Musculoskeletal:        General: No tenderness or edema. Normal range of motion.     Cervical back: Normal range of motion.  Lymphadenopathy:     Cervical: No cervical adenopathy.  Skin:    General: Skin is warm and dry.     Findings: No rash.  Neurological:     Mental Status: He is alert and oriented to person, place, and time.     Cranial Nerves: No cranial nerve deficit.     Motor: No abnormal muscle tone.     Coordination: He displays a negative Romberg sign. Coordination normal.     Gait: Gait normal.     Deep Tendon Reflexes: Reflexes are normal and symmetric.  Psychiatric:        Mood and Affect: Mood and affect normal.        Behavior: Behavior normal.        Thought Content: Thought content normal.        Judgment: Judgment normal.    He is alert oriented and cooperative with exception of the  fact that he thought today was March 3.  Neuro exam is nonfocal.    A total time of >45 minutes was spent preparing to see the patient, reviewing tests, x-rays,  records.  Also, obtaining history and performing comprehensive physical exam.  Additionally, counseling the patient regarding the above listed issues.   Finally, documenting clinical information in the health records, coordination of care,  educating the patient. It is a complex case.  Lab Results  Component Value Date   WBC 6.4 01/01/2021   HGB 12.8 (L) 01/01/2021   HCT 38.0 (L) 01/01/2021   PLT 121.0 (L) 01/01/2021   GLUCOSE 107 (H) 01/19/2021   CHOL 107 12/30/2019   TRIG 102.0 12/30/2019   HDL 46.80 12/30/2019   LDLCALC 39 12/30/2019   ALT 12 01/19/2021   AST 24 01/19/2021   NA 134 (L) 01/19/2021   K 4.9 01/19/2021   CL 99 01/19/2021   CREATININE 1.10 01/19/2021   BUN 17 01/19/2021   CO2 31 01/19/2021   TSH 2.51 12/30/2019   PSA 12.45 (H) 02/06/2016   INR 1.1 04/02/2017   HGBA1C 6.1 12/30/2019    No results found.  Assessment & Plan:    Walker Kehr, MD

## 2021-01-24 NOTE — Assessment & Plan Note (Addendum)
The patient thinks it is due to [place] use.  He thinks he did have cough before he started [place]. We will obtain a CXR

## 2021-01-24 NOTE — Assessment & Plan Note (Signed)
Discontinue milk of magnesia.  Use other laxatives as needed

## 2021-01-24 NOTE — Patient Instructions (Signed)
Hypercalcemia Hypercalcemia is when the level of calcium in a person's blood is above normal. The body needs calcium to make bones and keep them strong. Calcium also helps the muscles, nerves, brain, and heart work the way they should. Most of the calcium in the body is in the bones. There is also some calcium in the blood. Hypercalcemia can happen when calcium comes out of the bones, or when the kidneys are not able to remove calcium from the blood. Hypercalcemia can be mild or severe. What are the causes? There are many possible causes of hypercalcemia. Common causes of this condition include:  Hyperparathyroidism. This is a condition in which the body produces too much parathyroid hormone. There are four parathyroid glands in your neck. These glands produce a chemical messenger (hormone) that helps the body absorb calcium from foods and helps your bones release calcium.  Certain kinds of cancer. Less common causes of hypercalcemia include:  Getting too much calcium or vitamin D from your diet.  Kidney failure.  Hyperthyroidism.  Severe dehydration.  Being on bed rest or being inactive for a long time.  Certain medicines.  Infections. What increases the risk? You are more likely to develop this condition if you:  Are male.  Are 60 years of age or older.  Have a family history of hypercalcemia. What are the signs or symptoms? Mild hypercalcemia that starts slowly may not cause symptoms. Severe, sudden hypercalcemia is more likely to cause symptoms, such as:  Being more thirsty than usual.  Needing to urinate more often than usual.  Abdominal pain.  Nausea and vomiting.  Constipation.  Muscle pain, twitching, or weakness.  Feeling very tired. How is this diagnosed? Hypercalcemia is usually diagnosed with a blood test. You may also have tests to help determine what is causing this condition, such as imaging tests and more blood tests.   How is this  treated? Treatment for hypercalcemia depends on the cause. Treatment may include:  Receiving fluids through an IV.  Medicines that: ? Keep calcium levels steady after receiving fluids (loop diuretics). ? Keep calcium in your bones (bisphosphonates). ? Lower the calcium level in your blood.  Surgery to remove overactive parathyroid glands.  A procedure that filters your blood to correct calcium levels (hemodialysis). Follow these instructions at home:  Take over-the-counter and prescription medicines only as told by your health care provider.  Follow instructions from your health care provider about eating or drinking restrictions.  Drink enough fluid to keep your urine pale yellow.  Stay active. Weight-bearing exercise helps to keep calcium in your bones. Follow instructions from your health care provider about what type and level of exercise is safe for you.  Keep all follow-up visits as told by your health care provider. This is important.   Contact a health care provider if you have:  A fever.  A heartbeat that is irregular or very fast.  Changes in mood, memory, or personality. Get help right away if you:  Have severe abdominal pain.  Have chest pain.  Have trouble breathing.  Become very confused and sleepy.  Lose consciousness. Summary  Hypercalcemia is when the level of calcium in a person's blood is above normal. The body needs calcium to make bones and keep them strong. Calcium also helps the muscles, nerves, brain, and heart work the way they should.  There are many possible causes of hypercalcemia, and treatment depends on the cause.  Take over-the-counter and prescription medicines only as told by your   health care provider.  Follow instructions from your health care provider about eating or drinking restrictions. This information is not intended to replace advice given to you by your health care provider. Make sure you discuss any questions you have with  your health care provider. Document Revised: 12/08/2018 Document Reviewed: 08/17/2018 [employer]vier Patient Education  2021 [employer]  

## 2021-01-24 NOTE — Assessment & Plan Note (Signed)
Continue with good hydration 

## 2021-01-25 ENCOUNTER — Other Ambulatory Visit: Payer: Self-pay

## 2021-01-26 LAB — PTH, INTACT AND CALCIUM
Calcium: 11.2 mg/dL — ABNORMAL HIGH (ref 8.6–10.3)
PTH: 47 pg/mL (ref 14–64)

## 2021-01-29 ENCOUNTER — Other Ambulatory Visit: Payer: Self-pay

## 2021-01-29 ENCOUNTER — Ambulatory Visit (INDEPENDENT_AMBULATORY_CARE_PROVIDER_SITE_OTHER): Payer: PPO | Admitting: Endocrinology

## 2021-01-29 MED ORDER — CINACALCET HCL 30 MG PO TABS: 30.0000 mg | ORAL_TABLET | ORAL | 5 refills | Status: DC

## 2021-01-29 NOTE — Progress Notes (Signed)
Subjective:    Patient ID: [name] Christensen, male    DOB: Apr 06, 1932, 87 y.o.   MRN: [id number]  HPI Pt returns for f/u of hypocalcuric hypercalcemia (dx'ed 2016 (Ca++ was normal in 2015); other hypercalcemia w/u was neg; he has never had bony fx).  pt states he feels well in general.  He has AF.  He still takes and uncertain dosage of Vit-D and calcium.  Wife says pt had memory loss.   Past Medical History:  Diagnosis Date  . Actinic keratosis 06/09/2019   L sup forehead at hairline - bx proven  . AICD (automatic cardioverter/defibrillator) present 04/07/2017  . Allergy   . Anxiety   . BPH (benign prostatic hypertrophy)    elevated PSA Dr. Lindell Noe Bx 2010  . Cardiac arrest [facility])    a. out-of-hospital arrest 11/17/2012 - EF 40-45%, patent grafts on cath, received [employer] AICD.  [name] Carotid disease, bilateral (HCC)    a. 0-39% by doppers.  . Cataract    bil cataracts removed  . CHF (congestive heart failure) (HCC)   . Coronary artery disease    a. s/p MI/CABG 2005. b. s/p cath at time of VF arrest 10/2013 - grafts patent.  . Cough   . Diverticulosis   . Elevated LFTs    a. 10/2012 felt due to cardiac arrest - Hepatitis C Ab reactive from 10/29/2012>>Hep C RNA PCR negative 10/29/2012.   [name] GERD (gastroesophageal reflux disease)   . Hyperlipidemia   . Hypertension   . Internal hemorrhoids   . Myocardial infarction (HCC)    2005 - CABG x 5 2005  . RBBB   . Tubular adenoma of colon   . Ventricular bigeminy    a. Event monitor 01/2013: NSR with PVCs and occ bigeminy.    Past Surgical History:  Procedure Laterality Date  . APPENDECTOMY    . CARDIAC CATHETERIZATION  2007   with patent graft anatomy atretic left internal mammary  artery to the LAD which is nonobstructive. Will restart study June 08, 2007  . CARDIOVERSION N/A 03/27/2018   Procedure: CARDIOVERSION;  Surgeon: Iran Ouch, MD;  Location: [facility] ORS;  Service: Cardiovascular;  Laterality: N/A;  . CARDIOVERSION N/A  01/04/2019   Procedure: CARDIOVERSION (CATH LAB);  Surgeon: Iran Ouch, MD;  Location: [facility] ORS;  Service: Cardiovascular;  Laterality: N/A;  . CARDIOVERSION N/A 07/07/2019   Procedure: CARDIOVERSION;  Surgeon: Wendall Stade, MD;  Location: [facility] ENDOSCOPY;  Service: Cardiovascular;  Laterality: N/A;  . CATARACT EXTRACTION W/PHACO Right 04/23/2016   Procedure: CATARACT EXTRACTION PHACO AND INTRAOCULAR LENS PLACEMENT (IOC);  Surgeon: [name], MD;  Location: [facility] ORS;  Service: Ophthalmology;  Laterality: Right;  [place] 1.09 AP% 19.3 CDE 13.38 Fluid pack lot # [id number]  . CHOLECYSTECTOMY N/A 06/05/2017   Procedure: LAPAROSCOPIC CHOLECYSTECTOMY WITH INTRAOPERATIVE CHOLANGIOGRAM ERAS PATHWAY POSSIBLE NEEDLE CORE BIOPSY OF LIVER;  Surgeon: [name], MD;  Location: [facility] OR;  Service: General;  Laterality: N/A;  ERAS PATHWAY  . COLONOSCOPY    . CORONARY ARTERY BYPASS GRAFT  2005  . EYE SURGERY    . ICD GENERATOR CHANGEOUT N/A 04/07/2017   Procedure: ICD Generator Changeout;  Surgeon: Duke Salvia, MD;  Location: [facility] INVASIVE CV LAB;  Service: Cardiovascular;  Laterality: N/A;  . IMPLANTABLE CARDIOVERTER DEFIBRILLATOR IMPLANT N/A 11/19/2012   STJ single chamber ICD implanted by Dr Graciela Husbands for cardiac arrest   . INGUINAL HERNIA REPAIR Bilateral 01/17/2015   Procedure: BILATERAL LAPAROSCOPIC INGUINAL HERNIA REPAIR  WITH LEFT FEMORAL HERNIA REPAIR;  Surgeon: [name], MD;  Location: [facility] OR;  Service: General;  Laterality: Bilateral;  . INSERTION OF MESH Bilateral 01/17/2015   Procedure: INSERTION OF MESH;  Surgeon: [name], MD;  Location: [facility] OR;  Service: General;  Laterality: Bilateral;  . LAPAROSCOPIC CHOLECYSTECTOMY  06/05/2017  . LEAD INSERTION N/A 04/07/2017   Procedure: RA Lead Insertion;  Surgeon: Duke Salvia, MD;  Location: [facility] INVASIVE CV LAB;  Service: Cardiovascular;  Laterality: N/A;  . LEAD REVISION/REPAIR N/A 04/08/2017   Procedure: Atrial Lead Revision/Repair;  Surgeon:  Duke Salvia, MD;  Location: [facility] INVASIVE CV LAB;  Service: Cardiovascular;  Laterality: N/A;  . LEFT HEART CATH AND CORONARY ANGIOGRAPHY Left 05/14/2019   Procedure: LEFT HEART CATH AND CORONARY ANGIOGRAPHY;  Surgeon: Iran Ouch, MD;  Location: [facility] INVASIVE CV LAB;  Service: Cardiovascular;  Laterality: Left;  . LEFT HEART CATHETERIZATION WITH CORONARY/GRAFT ANGIOGRAM N/A 10/29/2012   Procedure: LEFT HEART CATHETERIZATION WITH Isabel Caprice;  Surgeon: [name], MD;  Location: [facility] CATH LAB;  Service: Cardiovascular;  Laterality: N/A;  . LIVER BIOPSY N/A 06/05/2017   Procedure: SINGLE SITE LIVER BIOPSY ERAS PATHWAY;  Surgeon: [name], MD;  Location: [facility] OR;  Service: General;  Laterality: N/A;  ERAS PATHWAY  . LUMBAR FUSION  09/2007    Social History   Socioeconomic History  . Marital status: Married    Spouse name: Not on file  . Number of children: 5  . Years of education: Not on file  . Highest education level: Not on file  Occupational History  . 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 Smoking status: Never Smoker  . Smokeless tobacco: Never Used  Vaping Use  . Vaping Use: Never used  Substance and Sexual Activity  . Alcohol use: No  . Drug use: No  . Sexual activity: Never  Other Topics Concern  . Not on file  Social History Narrative  . Not on file   Social Determinants of Health   Financial Resource Strain: Not on file  Food Insecurity: Not on file  Transportation Needs: Not on file  Physical Activity: Not on file  Stress: Not on file  Social Connections: Not on file  Intimate Partner Violence: Not on file    Current Outpatient Medications on File Prior to Visit  Medication Sig Dispense Refill  . acetaminophen (TYLENOL) 500 MG tablet Take 500 mg by mouth at bedtime.    [name] alfuzosin (UROXATRAL) 10 MG 24 hr tablet Take 10 mg by mouth every evening.     [name] atorvastatin (LIPITOR) 20 MG tablet TAKE 1 TABLET BY  MOUTH ONCE DAILY 90 tablet 3  . betamethasone dipropionate 0.05 % cream Apply topically 2 (two) times daily. 30 g 1  . cholecalciferol (VITAMIN D3) 10 MCG (400 UNIT) TABS tablet Take 1 tablet (400 Units total) by mouth daily. 30 tablet 11  . dofetilide (TIKOSYN) 250 MCG capsule TAKE ONE CAPSULE BY MOUTH TWICE A DAY 180 capsule 3  . dutasteride (AVODART) 0.5 MG capsule Take 0.5 mg by mouth every evening.     [name] ELIQUIS 5 MG TABS tablet TAKE 1 TABLET BY MOUTH TWICE (2) DAILY 180 tablet 3  . esomeprazole (NEXIUM) 20 MG capsule Take 1 capsule (20 mg total) by mouth daily at 12 noon. 30 capsule 3  . linaclotide (LINZESS) 290 MCG CAPS capsule Take 1 capsule (290 mcg total) by mouth daily as needed (constipation). 90  capsule 3  . loratadine (CLARITIN) 10 MG tablet Take 1 tablet (10 mg total) by mouth daily. 30 tablet 11  . losartan (COZAAR) 50 MG tablet Take 0.5 tablets (25 mg total) by mouth daily. 90 tablet 3  . magnesium oxide (MAG-OX) 400 (241.3 Mg) MG tablet TAKE 1 TABLET BY MOUTH ONCE DAILY 30 tablet 6  . metoprolol succinate (TOPROL-XL) 50 MG 24 hr tablet Take 1 tablet (50 mg total) by mouth 2 (two) times daily. Take with or immediately following a meal. 180 tablet 3  . Multiple Vitamin (MULTIVITAMIN WITH MINERALS) TABS tablet Take 1 tablet by mouth every other day.    . nitroGLYCERIN (NITROSTAT) 0.4 MG SL tablet As directed SL 25 tablet 3  . Polyethyl Glycol-Propyl Glycol (SYSTANE OP) Place 1 drop into both eyes daily as needed (burning eyes).     . sacubitril-valsartan (ENTRESTO) 24-26 MG Take 1 tablet by mouth 2 (two) times daily. 60 tablet 6  . triamcinolone cream (KENALOG) 0.1 % APPLY 1 APPLICATION TOPICALLY TWO TIMES DAILY AS NEEDED AVOIDING FACE, GROIN ANDARMPITS 80 g 0  . vitamin B-12 (CYANOCOBALAMIN) 1000 MCG tablet Take 1,000 mcg by mouth daily.     No current facility-administered medications on file prior to visit.    Allergies  Allergen Reactions  . Ezetimibe Other (See Comments)     Pt was in coma for 6 days, numbness  . Penicillins Other (See Comments)    BLISTERS BETWEEN FINGERS Did it involve swelling of the face/tongue/throat, SOB, or low BP? No Did it involve sudden or severe rash/hives, skin peeling, or any reaction on the inside of your mouth or nose? Yes Did you need to seek medical attention at a hospital or doctor's office? No When did it last happen?60 years If all above answers are "NO", may proceed with cephalosporin use.   . Pravastatin Sodium Other (See Comments)    Aches and pains in joints  . Rosuvastatin Other (See Comments)    MYALGIAS  . Niacin Anxiety and Other (See Comments)    "Makes me feel nervous, jittery"    Family History  Problem Relation Age of Onset  . Coronary artery disease Mother   . Heart attack Brother   . Stomach cancer Neg Hx   . Colon cancer Neg Hx   . Esophageal cancer Neg Hx   . Pancreatic cancer Neg Hx   . Prostate cancer Neg Hx   . Rectal cancer Neg Hx   . Hypercalcemia Neg Hx     BP (!) 130/58 (BP Location: Right Arm, Patient Position: Sitting, Cuff Size: Normal)   Pulse 85   Ht 5\' 7"  (1.702 m)   Wt 143 lb 9.6 oz (65.1 kg)   SpO2 98%   BMI 22.49 kg/m    Review of Systems He fell 2 mos ago.      Objective:   Physical Exam VITAL SIGNS:  See vs page GENERAL: no distress GAIT: normal and steady.    Lab Results  Component Value Date   PTH 47 01/24/2021   CALCIUM 11.2 (H) 01/24/2021   CAION 1.40 (H) 04/06/2013   PHOS 2.6 11/18/2012        Assessment & Plan:  hypocalcuric hypercalcemia, persistent.   Memory loss.  We discussed the possibility that this is contributed to by hypercalcemia.  Pt says he wants to try rx.     Patient Instructions   Please stop taking the calcium pill, and reduce the Vitamin-D to 400 units per day.  Let's recheck the 24-HR urine for calcium.   I have sent a prescription to your pharmacy, for a pill to lower the calcium.   Please come back for a follow-up  appointment in 1 month.      Fall Prevention in the Home, Adult Falls can cause injuries and can affect people from all age groups. There are many simple things that you can do to make your home safe and to help prevent falls. Ask for help when making these changes, if needed. What actions can I take to prevent falls? General instructions  Use good lighting in all rooms. Replace any light bulbs that burn out.  Turn on lights if it is dark. Use night-lights.  Place frequently used items in easy-to-reach places. Lower the shelves around your home if necessary.  Set up furniture so that there are clear paths around it. Avoid moving your furniture around.  Remove throw rugs and other tripping hazards from the floor.  Avoid walking on wet floors.  Fix any uneven floor surfaces.  Add color or contrast paint or tape to grab bars and handrails in your home. Place contrasting color strips on the first and last steps of stairways.  When you use a stepladder, make sure that it is completely opened and that the sides are firmly locked. Have someone hold the ladder while you are using it. Do not climb a closed stepladder.  Be aware of any and all pets. What can I do in the bathroom?  Keep the floor dry. Immediately clean up any water that spills onto the floor.  Remove soap buildup in the tub or shower on a regular basis.  Use non-skid mats or decals on the floor of the tub or shower.  Attach bath mats securely with double-sided, non-slip rug tape.  If you need to sit down while you are in the shower, use a plastic, non-slip stool.  Install grab bars by the toilet and in the tub and shower. Do not use towel bars as grab bars.      What can I do in the bedroom?  Make sure that a bedside light is easy to reach.  Do not use oversized bedding that drapes onto the floor.  Have a firm chair that has side arms to use for getting dressed. What can I do in the kitchen?  Clean up any  spills right away.  If you need to reach for something above you, use a sturdy step stool that has a grab bar.  Keep electrical cables out of the way.  Do not use floor polish or wax that makes floors slippery. If you must use wax, make sure that it is non-skid floor wax. What can I do in the stairways?  Do not leave any items on the stairs.  Make sure that you have a light switch at the top of the stairs and the bottom of the stairs. Have them installed if you do not have them.  Make sure that there are handrails on both sides of the stairs. Fix handrails that are broken or loose. Make sure that handrails are as long as the stairways.  Install non-slip stair treads on all stairs in your home.  Avoid having throw rugs at the top or bottom of stairways, or secure the rugs with carpet tape to prevent them from moving.  Choose a carpet design that does not hide the edge of steps on the stairway.  Check any carpeting to make sure that it  is firmly attached to the stairs. Fix any carpet that is loose or worn. What can I do on the outside of my home?  Use bright outdoor lighting.  Regularly repair the edges of walkways and driveways and fix any cracks.  Remove high doorway thresholds.  Trim any shrubbery on the main path into your home.  Regularly check that handrails are securely fastened and in good repair. Both sides of any steps should have handrails.  Install guardrails along the edges of any raised decks or porches.  Clear walkways of debris and clutter, including tools and rocks.  Have leaves, snow, and ice cleared regularly.  Use sand or salt on walkways during winter months.  In the garage, clean up any spills right away, including grease or oil spills. What other actions can I take?  Wear closed-toe shoes that fit well and support your feet. Wear shoes that have rubber soles or low heels.  Use mobility aids as needed, such as canes, walkers, scooters, and  crutches.  Review your medicines with your health care provider. Some medicines can cause dizziness or changes in blood pressure, which increase your risk of falling. Talk with your health care provider about other ways that you can decrease your risk of falls. This may include working with a physical therapist or trainer to improve your strength, balance, and endurance. Where to find more information  Centers for Disease Control and Prevention, STEADI: TVDivision.uy  [employer] on Aging: RingConnections.si Contact a health care provider if:  You are afraid of falling at home.  You feel weak, drowsy, or dizzy at home.  You fall at home. Summary  There are many simple things that you can do to make your home safe and to help prevent falls.  Ways to make your home safe include removing tripping hazards and installing grab bars in the bathroom.  Ask for help when making these changes in your home. This information is not intended to replace advice given to you by your health care provider. Make sure you discuss any questions you have with your health care provider. Document Revised: 10/24/2017 Document Reviewed: 06/26/2017 Elsevier Patient Education  2021 [employer].

## 2021-01-29 NOTE — Patient Instructions (Addendum)
Please stop taking the calcium pill, and reduce the Vitamin-D to 400 units per day.   Let's recheck the 24-HR urine for calcium.   I have sent a prescription to your pharmacy, for a pill to lower the calcium.   Please come back for a follow-up appointment in 1 month.      Fall Prevention in the Home, Adult Falls can cause injuries and can affect people from all age groups. There are many simple things that you can do to make your home safe and to help prevent falls. Ask for help when making these changes, if needed. What actions can I take to prevent falls? General instructions  Use good lighting in all rooms. Replace any light bulbs that burn out.  Turn on lights if it is dark. Use night-lights.  Place frequently used items in easy-to-reach places. Lower the shelves around your home if necessary.  Set up furniture so that there are clear paths around it. Avoid moving your furniture around.  Remove throw rugs and other tripping hazards from the floor.  Avoid walking on wet floors.  Fix any uneven floor surfaces.  Add color or contrast paint or tape to grab bars and handrails in your home. Place contrasting color strips on the first and last steps of stairways.  When you use a stepladder, make sure that it is completely opened and that the sides are firmly locked. Have someone hold the ladder while you are using it. Do not climb a closed stepladder.  Be aware of any and all pets. What can I do in the bathroom?  Keep the floor dry. Immediately clean up any water that spills onto the floor.  Remove soap buildup in the tub or shower on a regular basis.  Use non-skid mats or decals on the floor of the tub or shower.  Attach bath mats securely with double-sided, non-slip rug tape.  If you need to sit down while you are in the shower, use a plastic, non-slip stool.  Install grab bars by the toilet and in the tub and shower. Do not use towel bars as grab bars.      What can I do  in the bedroom?  Make sure that a bedside light is easy to reach.  Do not use oversized bedding that drapes onto the floor.  Have a firm chair that has side arms to use for getting dressed. What can I do in the kitchen?  Clean up any spills right away.  If you need to reach for something above you, use a sturdy step stool that has a grab bar.  Keep electrical cables out of the way.  Do not use floor polish or wax that makes floors slippery. If you must use wax, make sure that it is non-skid floor wax. What can I do in the stairways?  Do not leave any items on the stairs.  Make sure that you have a light switch at the top of the stairs and the bottom of the stairs. Have them installed if you do not have them.  Make sure that there are handrails on both sides of the stairs. Fix handrails that are broken or loose. Make sure that handrails are as long as the stairways.  Install non-slip stair treads on all stairs in your home.  Avoid having throw rugs at the top or bottom of stairways, or secure the rugs with carpet tape to prevent them from moving.  Choose a Loss adjuster, chartered that does not hide  the edge of steps on the stairway.  Check any carpeting to make sure that it is firmly attached to the stairs. Fix any carpet that is loose or worn. What can I do on the outside of my home?  Use bright outdoor lighting.  Regularly repair the edges of walkways and driveways and fix any cracks.  Remove high doorway thresholds.  Trim any shrubbery on the main path into your home.  Regularly check that handrails are securely fastened and in good repair. Both sides of any steps should have handrails.  Install guardrails along the edges of any raised decks or porches.  Clear walkways of debris and clutter, including tools and rocks.  Have leaves, snow, and ice cleared regularly.  Use sand or salt on walkways during winter months.  In the garage, clean up any spills right away, including  grease or oil spills. What other actions can I take?  Wear closed-toe shoes that fit well and support your feet. Wear shoes that have rubber soles or low heels.  Use mobility aids as needed, such as canes, walkers, scooters, and crutches.  Review your medicines with your health care provider. Some medicines can cause dizziness or changes in blood pressure, which increase your risk of falling. Talk with your health care provider about other ways that you can decrease your risk of falls. This may include working with a physical therapist or trainer to improve your strength, balance, and endurance. Where to find more information  Centers for Disease Control and Prevention, STEADI: WebmailGuide.co.za  [employer] on Aging: BrainJudge.co.uk Contact a health care provider if:  You are afraid of falling at home.  You feel weak, drowsy, or dizzy at home.  You fall at home. Summary  There are many simple things that you can do to make your home safe and to help prevent falls.  Ways to make your home safe include removing tripping hazards and installing grab bars in the bathroom.  Ask for help when making these changes in your home. This information is not intended to replace advice given to you by your health care provider. Make sure you discuss any questions you have with your health care provider. Document Revised: 10/24/2017 Document Reviewed: 06/26/2017 Elsevier Patient Education  2021 [employer].

## 2021-01-30 LAB — PROTEIN ELECTROPHORESIS, SERUM
Albumin ELP: 3.8 g/dL (ref 3.8–4.8)
Alpha 1: 0.3 g/dL (ref 0.2–0.3)
Alpha 2: 0.8 g/dL (ref 0.5–0.9)
Beta 2: 0.4 g/dL (ref 0.2–0.5)
Beta Globulin: 0.4 g/dL (ref 0.4–0.6)
Gamma Globulin: 1.6 g/dL (ref 0.8–1.7)
Total Protein: 7.3 g/dL (ref 6.1–8.1)

## 2021-01-30 LAB — PTH-RELATED PEPTIDE: PTH-Related Protein (PTH-RP): 13 pg/mL (ref 11–20)

## 2021-01-31 ENCOUNTER — Other Ambulatory Visit: Payer: PPO

## 2021-01-31 ENCOUNTER — Telehealth: Payer: Self-pay | Admitting: Endocrinology

## 2021-01-31 NOTE — Telephone Encounter (Signed)
Spoke with the [employer] for clarification.

## 2021-01-31 NOTE — Telephone Encounter (Signed)
[name] Christensen with [employer] requests to be called at ph# [phone], Ext # [phone], Case# [id number] for clarification of a PA form [name] Christensen received for  cinacalcet (SENSIPAR) 30 MG tablet

## 2021-02-01 ENCOUNTER — Encounter: Payer: Self-pay | Admitting: Endocrinology

## 2021-02-01 LAB — CALCIUM, URINE, 24 HOUR: Calcium, 24H Urine: 107 mg/24 h

## 2021-02-02 ENCOUNTER — Telehealth: Payer: Self-pay

## 2021-02-02 ENCOUNTER — Telehealth: Payer: Self-pay | Admitting: Endocrinology

## 2021-02-02 NOTE — Telephone Encounter (Signed)
please contact patient: Sorry, but the medication was declined by insurance.

## 2021-02-02 NOTE — Telephone Encounter (Signed)
Please do PA for cinacalcet.  Pt has hypocalcuric hypERcalcemia.  Hypercalcemia is causing memory loss.

## 2021-02-02 NOTE — Telephone Encounter (Signed)
LVM for pt to let him know that his Cinacalcet HCL has been denied by the insurance company. He can also contact his insurance company to see if there is an alternative to the above medication and let us know

## 2021-02-02 NOTE — Telephone Encounter (Signed)
It was denied and papers are on your desk.

## 2021-02-07 ENCOUNTER — Telehealth: Payer: Self-pay | Admitting: Internal Medicine

## 2021-02-07 NOTE — Telephone Encounter (Signed)
LVM for pt to rtn my call to schedule AWV with [facility]. Please schedule appt if pt calls the office.  

## 2021-02-19 ENCOUNTER — Ambulatory Visit (INDEPENDENT_AMBULATORY_CARE_PROVIDER_SITE_OTHER): Payer: PPO

## 2021-02-19 DIAGNOSIS — Z0000 Encounter for general adult medical examination without abnormal findings: Secondary | ICD-10-CM

## 2021-02-19 NOTE — Progress Notes (Addendum)
I connected with [name] Christensen today by telephone and verified that I am speaking with the correct person using two identifiers. Location patient: home Location provider: work Persons participating in the virtual visit: [employer] and [employer], LPN.   I discussed the limitations, risks, security and privacy concerns of performing an evaluation and management service by telephone and the availability of in person appointments. I also discussed with the patient that there may be a patient responsible charge related to this service. The patient expressed understanding and verbally consented to this telephonic visit.    Interactive audio and video telecommunications were attempted between this provider and patient, however failed, due to patient having technical difficulties OR patient did not have access to video capability.  We continued and completed visit with audio only.  Some vital signs may be absent or patient reported.   Time Spent with patient on telephone encounter: 30 minutes  Subjective:   [name] Christensen is a 87 y.o. male who presents for Medicare Annual/Subsequent preventive examination.  Review of Systems    No ROS. Medicare Wellness Visit. Additional risk factors are reflected in social history.  Cardiac Risk Factors include: advanced age (>37men, >87 women);dyslipidemia;family history of premature cardiovascular disease;hypertension;male gender     Objective:    There were no vitals filed for this visit. There is no height or weight on file to calculate BMI.  Advanced Directives 02/19/2021 10/25/2019 07/05/2019 05/14/2019 01/04/2019 09/16/2018 06/05/2017  Does Patient Have a Medical Advance Directive? Yes Yes Yes No Yes Yes Yes  Type of Estate agent of [place];Living will Healthcare Power of [place];Living will Healthcare Power of [place];Living will - Healthcare Power of [place];Living will Healthcare Power of [place];Living will  Healthcare Power of Attorney  Does patient want to make changes to medical advance directive? No - Patient declined - No - Patient declined - No - Patient declined - No - Patient declined  Copy of Healthcare Power of Attorney in Chart? No - copy requested No - copy requested Yes - validated most recent copy scanned in chart (See row information) - No - copy requested No - copy requested Yes  Would patient like information on creating a medical advance directive? - - - No - Guardian declined - - -  Pre-existing out of facility DNR order (yellow form or pink MOST form) - - - - - - -    Current Medications (verified) Outpatient Encounter Medications as of 02/19/2021  Medication Sig   acetaminophen (TYLENOL) 500 MG tablet Take 500 mg by mouth at bedtime.   alfuzosin (UROXATRAL) 10 MG 24 hr tablet Take 10 mg by mouth every evening.    atorvastatin (LIPITOR) 20 MG tablet TAKE 1 TABLET BY MOUTH ONCE DAILY   betamethasone dipropionate 0.05 % cream Apply topically 2 (two) times daily.   cholecalciferol (VITAMIN D3) 10 MCG (400 UNIT) TABS tablet Take 1 tablet (400 Units total) by mouth daily.   cinacalcet (SENSIPAR) 30 MG tablet Take 1 tablet (30 mg total) by mouth 3 (three) times a week.   dofetilide (TIKOSYN) 250 MCG capsule TAKE ONE CAPSULE BY MOUTH TWICE A DAY   dutasteride (AVODART) 0.5 MG capsule Take 0.5 mg by mouth every evening.    ELIQUIS 5 MG TABS tablet TAKE 1 TABLET BY MOUTH TWICE (2) DAILY   esomeprazole (NEXIUM) 20 MG capsule Take 1 capsule (20 mg total) by mouth daily at 12 noon.   linaclotide (LINZESS) 290 MCG CAPS capsule Take 1 capsule (290  mcg total) by mouth daily as needed (constipation).   loratadine (CLARITIN) 10 MG tablet Take 1 tablet (10 mg total) by mouth daily.   losartan (COZAAR) 50 MG tablet Take 0.5 tablets (25 mg total) by mouth daily.   magnesium oxide (MAG-OX) 400 (241.3 Mg) MG tablet TAKE 1 TABLET BY MOUTH ONCE DAILY   metoprolol succinate (TOPROL-XL) 50 MG 24 hr  tablet Take 1 tablet (50 mg total) by mouth 2 (two) times daily. Take with or immediately following a meal.   Multiple Vitamin (MULTIVITAMIN WITH MINERALS) TABS tablet Take 1 tablet by mouth every other day.   nitroGLYCERIN (NITROSTAT) 0.4 MG SL tablet As directed SL   Polyethyl Glycol-Propyl Glycol (SYSTANE OP) Place 1 drop into both eyes daily as needed (burning eyes).    sacubitril-valsartan (ENTRESTO) 24-26 MG Take 1 tablet by mouth 2 (two) times daily.   triamcinolone cream (KENALOG) 0.1 % APPLY 1 APPLICATION TOPICALLY TWO TIMES DAILY AS NEEDED AVOIDING FACE, GROIN ANDARMPITS   vitamin B-12 (CYANOCOBALAMIN) 1000 MCG tablet Take 1,000 mcg by mouth daily.   No facility-administered encounter medications on file as of 02/19/2021.    Allergies (verified) Ezetimibe, Penicillins, Pravastatin sodium, Rosuvastatin, and Niacin   History: Past Medical History:  Diagnosis Date   Actinic keratosis 06/09/2019   L sup forehead at hairline - bx proven   AICD (automatic cardioverter/defibrillator) present 04/07/2017   Allergy    Anxiety    BPH (benign prostatic hypertrophy)    elevated PSA Dr. Lindell Noe Bx 2010   Cardiac arrest [facility])    a. out-of-hospital arrest 11/13/2012 - EF 40-45%, patent grafts on cath, received [employer] AICD.   Carotid disease, bilateral (HCC)    a. 0-39% by doppers.   Cataract    bil cataracts removed   CHF (congestive heart failure) (HCC)    Coronary artery disease    a. s/p MI/CABG 2005. b. s/p cath at time of VF arrest 10/2013 - grafts patent.   Cough    Diverticulosis    Elevated LFTs    a. 10/2012 felt due to cardiac arrest - Hepatitis C Ab reactive from 10/28/2012>>Hep C RNA PCR negative 11/22/2012.    GERD (gastroesophageal reflux disease)    Hyperlipidemia    Hypertension    Internal hemorrhoids    Myocardial infarction (HCC)    2005 - CABG x 5 2005   RBBB    Tubular adenoma of colon    Ventricular bigeminy    a. Event monitor 01/2013: NSR with PVCs and occ  bigeminy.   Past Surgical History:  Procedure Laterality Date   APPENDECTOMY     CARDIAC CATHETERIZATION  2007   with patent graft anatomy atretic left internal mammary  artery to the LAD which is nonobstructive. Will restart study June 08, 2007   CARDIOVERSION N/A 03/27/2018   Procedure: CARDIOVERSION;  Surgeon: Iran Ouch, MD;  Location: [facility] ORS;  Service: Cardiovascular;  Laterality: N/A;   CARDIOVERSION N/A 01/04/2019   Procedure: CARDIOVERSION (CATH LAB);  Surgeon: Iran Ouch, MD;  Location: [facility] ORS;  Service: Cardiovascular;  Laterality: N/A;   CARDIOVERSION N/A 07/07/2019   Procedure: CARDIOVERSION;  Surgeon: Wendall Stade, MD;  Location: [facility] ENDOSCOPY;  Service: Cardiovascular;  Laterality: N/A;   CATARACT EXTRACTION W/PHACO Right 04/23/2016   Procedure: CATARACT EXTRACTION PHACO AND INTRAOCULAR LENS PLACEMENT (IOC);  Surgeon: [name], MD;  Location: [facility] ORS;  Service: Ophthalmology;  Laterality: Right;  [place] 1.09 AP% 19.3 CDE 13.38 Fluid pack lot #  [id number]   CHOLECYSTECTOMY N/A 06/05/2017   Procedure: LAPAROSCOPIC CHOLECYSTECTOMY WITH INTRAOPERATIVE CHOLANGIOGRAM ERAS PATHWAY POSSIBLE NEEDLE CORE BIOPSY OF LIVER;  Surgeon: [name], MD;  Location: [facility] OR;  Service: General;  Laterality: N/A;  ERAS PATHWAY   COLONOSCOPY     CORONARY ARTERY BYPASS GRAFT  2005   EYE SURGERY     ICD GENERATOR CHANGEOUT N/A 04/07/2017   Procedure: ICD Generator Changeout;  Surgeon: Duke Salvia, MD;  Location[facility] INVASIVE CV LAB;  Service: Cardiovascular;  Laterality: N/A;   IMPLANTABLE CARDIOVERTER DEFIBRILLATOR IMPLANT N/A 10/28/2012   STJ single chamber ICD implanted by Dr Graciela Husbands for cardiac arrest    INGUINAL HERNIA REPAIR Bilateral 01/17/2015   Procedure: BILATERAL LAPAROSCOPIC INGUINAL HERNIA REPAIR WITH LEFT FEMORAL HERNIA REPAIR;  Surgeon: [name], MD;  Location: [facility] OR;  Service: General;  Laterality: Bilateral;   INSERTION OF MESH Bilateral 01/17/2015    Procedure: INSERTION OF MESH;  Surgeon: [name], MD;  Location[facility] OR;  Service: General;  Laterality: Bilateral;   LAPAROSCOPIC CHOLECYSTECTOMY  06/05/2017   LEAD INSERTION N/A 04/07/2017   Procedure: RA Lead Insertion;  Surgeon: Duke Salvia, MD;  Location: [facility] INVASIVE CV LAB;  Service: Cardiovascular;  Laterality: N/A;   LEAD REVISION/REPAIR N/A 04/08/2017   Procedure: Atrial Lead Revision/Repair;  Surgeon: Duke Salvia, MD;  Location: [facility] INVASIVE CV LAB;  Service: Cardiovascular;  Laterality: N/A;   LEFT HEART CATH AND CORONARY ANGIOGRAPHY Left 05/14/2019   Procedure: LEFT HEART CATH AND CORONARY ANGIOGRAPHY;  Surgeon: Iran Ouch, MD;  Location: [facility] INVASIVE CV LAB;  Service: Cardiovascular;  Laterality: Left;   LEFT HEART CATHETERIZATION WITH CORONARY/GRAFT ANGIOGRAM N/A 11/22/2012   Procedure: LEFT HEART CATHETERIZATION WITH Isabel Caprice;  Surgeon: [name], MD;  Location: [facility] CATH LAB;  Service: Cardiovascular;  Laterality: N/A;   LIVER BIOPSY N/A 06/05/2017   Procedure: SINGLE SITE LIVER BIOPSY ERAS PATHWAY;  Surgeon: [name], MD;  Location: [facility] OR;  Service: General;  Laterality: N/A;  ERAS PATHWAY   LUMBAR FUSION  09/2007   Family History  Problem Relation Age of Onset   Coronary artery disease Mother    Heart attack Brother    Stomach cancer Neg Hx    Colon cancer Neg Hx    Esophageal cancer Neg Hx    Pancreatic cancer Neg Hx    Prostate cancer Neg Hx    Rectal cancer Neg Hx    Hypercalcemia Neg Hx    Social History   Socioeconomic History   Marital status: Married    Spouse name: Not on file   Number of children: 5   Years of education: Not on file   Highest education level: Not on file  Occupational History   Occupation: Engineer, 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  Tobacco Use   Smoking status: Never Smoker   Smokeless tobacco: Never Used  Building services engineer Use: Never used  Substance and Sexual Activity   Alcohol use: No   Drug  use: No   Sexual activity: Never  Other Topics Concern   Not on file  Social History Narrative   Not on file   Social Determinants of Health   Financial Resource Strain: Low Risk    Difficulty of Paying Living Expenses: Not hard at all  Food Insecurity: No Food Insecurity   Worried About Programme researcher, broadcasting/film/video in the Last Year: Never true   Ran Out of Food in the Last Year: Never  true  Transportation Needs: No Transportation Needs   Lack of Transportation (Medical): No   Lack of Transportation (Non-Medical): No  Physical Activity: Sufficiently Active   Days of Exercise per Week: 6 days   Minutes of Exercise per Session: 30 min  Stress: No Stress Concern Present   Feeling of Stress : Not at all  Social Connections: Socially Integrated   Frequency of Communication with Friends and Family: More than three times a week   Frequency of Social Gatherings with Friends and Family: Once a week   Attends Religious Services: More than 4 times per year   Active Member of [employer] or Organizations: No   Attends Engineer, structural: More than 4 times per year   Marital Status: Married    Tobacco Counseling Counseling given: Not Answered   Clinical Intake:  Pre-visit preparation completed: Yes  Pain : No/denies pain     Nutritional Risks: None Diabetes: No  How often do you need to have someone help you when you read instructions, pamphlets, or other written materials from your doctor or pharmacy?: 1 - Never What is the last grade level you completed in school?: High School Graduate  Diabetic? no  Interpreter Needed?: No  Information entered by :: Susie Cassette, LPN   Activities of Daily Living In your present state of health, do you have any difficulty performing the following activities: 02/19/2021  Hearing? Y  Comment right ear  Vision? N  Difficulty concentrating or making decisions? Y  Comment issues with remembering names and places  Walking or climbing  stairs? N  Dressing or bathing? N  Doing errands, shopping? N  Preparing Food and eating ? N  Using the Toilet? N  In the past six months, have you accidently leaked urine? N  Do you have problems with loss of bowel control? N  Managing your Medications? N  Managing your Finances? N  Housekeeping or managing your Housekeeping? N  Some recent data might be hidden    Patient Care Team: Plotnikov, Georgina Quint, MD as PCP - General (Internal Medicine) Wendall Stade, MD as PCP - Cardiology (Cardiology) Duke Salvia, MD as PCP - Electrophysiology (Cardiology) Lindell Noe [employer] Prom[employer] MD (Urology) [name], MD as Consulting Physician (General Surgery) Pyrtle, Carie Caddy, MD as Consulting Physician (Gastroenterology) [name], MD as Referring Physician (Ophthalmology)  Indicate any recent Medical Services you may have received from other than [facility] providers in the past year (date may be approximate).     Assessment:   This is a routine wellness examination for [employer].  Hearing/Vision screen No exam data present  Dietary issues and exercise activities discussed: Current Exercise Habits: Home exercise routine;Structured exercise class, Type of exercise: walking;treadmill;stretching;strength training/weights, Time (Minutes): 30, Frequency (Times/Week): 6, Weekly Exercise (Minutes/Week): 180, Intensity: Moderate  Goals      Maintain current health status     Continue to exercise, eat healthy, enjoy life, family and stay spiritually connected to God.      Patient Stated     Stay as healthy and as independent as possible     Patient Stated     To maintain my current health status by continuing to eat healthy, stay physically active and socially active.       Depression Screen PHQ 2/9 Scores 02/19/2021 01/01/2021 01/01/2021 10/25/2019 09/16/2018 05/12/2018 04/24/2017  PHQ - 2 Score 0 0 0 0 0 0 0  PHQ- 9 Score - 0 - - - - -  Fall Risk Fall Risk  02/19/2021 01/24/2021 01/01/2021  01/01/2021 10/25/2019  Falls in the past year? 0 0 - - 1  Number falls in past yr: 0 0 0 0 0  Injury with Fall? 0 0 1 1 0  Comment - - PT STATES HE FELL ABT 2 WEEKS AGO HURT (L) SHOULDER PT STATES HE HURST HIS (L) SHOULDER ABT 2 WEEKS AGO -  Risk for fall due to : No Fall Risks No Fall Risks Impaired balance/gait Impaired balance/gait Impaired balance/gait  Follow up Falls evaluation completed - - - -  Comment - - - - -    FALL RISK PREVENTION PERTAINING TO THE HOME:  Any stairs in or around the home? Yes  If so, are there any without handrails? No  Home free of loose throw rugs in walkways, pet beds, electrical cords, etc? Yes  Adequate lighting in your home to reduce risk of falls? Yes   ASSISTIVE DEVICES UTILIZED TO PREVENT FALLS:  Life alert? No  Use of a cane, walker or w/c? No  Grab bars in the bathroom? No  Shower chair or bench in shower? Yes  Elevated toilet seat or a handicapped toilet? Yes   TIMED UP AND GO:  Was the test performed? No .  Length of time to ambulate 10 feet: 0 sec.   Gait steady and fast without use of assistive device  Cognitive Function: Normal cognitive status assessed by direct observation by this Nurse Health Advisor. No abnormalities found.          Immunizations Immunization History  Administered Date(s) Administered   Fluad Quad(high Dose 65+) 09/29/2019, 08/21/2020   Influenza Whole 09/07/2008, 12/11/2009, 08/26/2011, 09/25/2012   Influenza, High Dose Seasonal PF 08/05/2016, 08/11/2017, 08/17/2018   Influenza,inj,Quad PF,6+ Mos 08/30/2013, 08/03/2014, 08/09/2015   PFIZER(Purple Top)SARS-COV-2 Vaccination 12/27/2019, 01/17/2020, 08/21/2020   Pneumococcal Conjugate-13 01/31/2014   Pneumococcal Polysaccharide-23 09/08/2007, 06/04/2016   Td 09/09/2001, 12/26/2011   Tdap 08/17/2018   Zoster Recombinat (Shingrix) 08/04/2018, 11/05/2018    TDAP status: Up to date  Flu Vaccine status: Up to date  Pneumococcal vaccine status: Up to  date  Covid-19 vaccine status: Completed vaccines  Qualifies for Shingles Vaccine? Yes   Zostavax completed Yes   Shingrix Completed?: Yes  Screening Tests Health Maintenance  Topic Date Due   COVID-19 Vaccine (4 - Booster for Pfizer series) 02/18/2021   TETANUS/TDAP  08/17/2028   INFLUENZA VACCINE  Completed   PNA vac Low Risk Adult  Completed   HPV VACCINES  Aged Out    Health Maintenance  Health Maintenance Due  Topic Date Due   COVID-19 Vaccine (4 - Booster for Pfizer series) 02/18/2021    Colorectal cancer screening: No longer required.   Lung Cancer Screening: (Low Dose CT Chest recommended if Age 10-80 years, 30 pack-year currently smoking OR have quit w/in 15years.) does not qualify.   Lung Cancer Screening Referral: no  Additional Screening:  Hepatitis C Screening: does not qualify; Completed no  Vision Screening: Recommended annual ophthalmology exams for early detection of glaucoma and other disorders of the eye. Is the patient up to date with their annual eye exam?  Yes  Who is the provider or what is the name of the office in which the patient attends annual eye exams? [facility] If pt is not established with a provider, would they like to be referred to a provider to establish care? No .   Dental Screening: Recommended annual dental exams for proper oral  hygiene  Community Resource Referral / Chronic Care Management: CRR required this visit?  No   CCM required this visit?  No      Plan:     I have personally reviewed and noted the following in the patient's chart:   Medical and social history Use of alcohol, tobacco or illicit drugs  Current medications and supplements Functional ability and status Nutritional status Physical activity Advanced directives List of other physicians Hospitalizations, surgeries, and ER visits in previous 12 months Vitals Screenings to include cognitive, depression, and falls Referrals and  appointments  In addition, I have reviewed and discussed with patient certain preventive protocols, quality metrics, and best practice recommendations. A written personalized care plan for preventive services as well as general preventive health recommendations were provided to patient.     Mickeal Needy, LPN   [id number]/61/0960   Nurse Notes:  Patient is cogitatively intact. There were no vitals filed for this visit. There is no height or weight on file to calculate BMI. Patient stated that he has no issues with gait or balance; does not use any assistive devices. Medications reviewed with patient; no opioid use noted.  Medical screening examination/treatment/procedure(s) were performed by non-physician practitioner and as supervising physician I was immediately available for consultation/collaboration.  I agree with above. Jacinta Shoe, MD

## 2021-02-19 NOTE — Patient Instructions (Addendum)
[name] Christensen , Thank you for taking time to come for your Medicare Wellness Visit. I appreciate your ongoing commitment to your health goals. Please review the following plan we discussed and let me know if I can assist you in the future.   Screening recommendations/referrals: Colonoscopy: 01/02/2017; no repeat due to age Recommended yearly ophthalmology/optometry visit for glaucoma screening and checkup Recommended yearly dental visit for hygiene and checkup  Vaccinations: Influenza vaccine: 08/21/2020 Pneumococcal vaccine: 01/31/2014, 06/04/2016 Tdap vaccine: 08/17/2018 Shingles vaccine: 08/04/2018, 11/05/2018   Covid-19: 12/27/2019, 01/17/2020, 08/01/2020  Advanced directives: Please bring a copy of your health care power of attorney and living will to the office at your convenience.  Conditions/risks identified: Yes; Reviewed health maintenance screenings with patient today and relevant education, vaccines, and/or referrals were provided. Please continue to do your personal lifestyle choices by: daily care of teeth and gums, regular physical activity (goal should be 5 days a week for 30 minutes), eat a healthy diet, avoid tobacco and drug use, limiting any alcohol intake, taking a low-dose aspirin (if not allergic or have been advised by your provider otherwise) and taking vitamins and minerals as recommended by your provider. Continue doing brain stimulating activities (puzzles, reading, adult coloring books, staying active) to keep memory sharp. Continue to eat heart healthy diet (full of fruits, vegetables, whole grains, lean protein, water--limit salt, fat, and sugar intake) and increase physical activity as tolerated.  Next appointment:  Please schedule your next Medicare Wellness Visit with your Nurse Health Advisor in 1 year by calling [phone].  Preventive Care 85 Years and Older, Male Preventive care refers to lifestyle choices and visits with your health care provider that can promote health  and wellness. What does preventive care include?  A yearly physical exam. This is also called an annual well check.  Dental exams once or twice a year.  Routine eye exams. Ask your health care provider how often you should have your eyes checked.  Personal lifestyle choices, including:  Daily care of your teeth and gums.  Regular physical activity.  Eating a healthy diet.  Avoiding tobacco and drug use.  Limiting alcohol use.  Practicing safe sex.  Taking low doses of aspirin every day.  Taking vitamin and mineral supplements as recommended by your health care provider. What happens during an annual well check? The services and screenings done by your health care provider during your annual well check will depend on your age, overall health, lifestyle risk factors, and family history of disease. Counseling  Your health care provider may ask you questions about your:  Alcohol use.  Tobacco use.  Drug use.  Emotional well-being.  Home and relationship well-being.  Sexual activity.  Eating habits.  History of falls.  Memory and ability to understand (cognition).  Work and work Statistician. Screening  You may have the following tests or measurements:  Height, weight, and BMI.  Blood pressure.  Lipid and cholesterol levels. These may be checked every 5 years, or more frequently if you are over 62 years old.  Skin check.  Lung cancer screening. You may have this screening every year starting at age 85 if you have a 30-pack-year history of smoking and currently smoke or have quit within the past 15 years.  Fecal occult blood test (FOBT) of the stool. You may have this test every year starting at age 85.  Flexible sigmoidoscopy or colonoscopy. You may have a sigmoidoscopy every 5 years or a colonoscopy every 10 years starting at age  85.  Prostate cancer screening. Recommendations will vary depending on your family history and other risks.  Hepatitis C  blood test.  Hepatitis B blood test.  Sexually transmitted disease (STD) testing.  Diabetes screening. This is done by checking your blood sugar (glucose) after you have not eaten for a while (fasting). You may have this done every 1-3 years.  Abdominal aortic aneurysm (AAA) screening. You may need this if you are a current or former smoker.  Osteoporosis. You may be screened starting at age 85 if you are at high risk. Talk with your health care provider about your test results, treatment options, and if necessary, the need for more tests. Vaccines  Your health care provider may recommend certain vaccines, such as:  Influenza vaccine. This is recommended every year.  Tetanus, diphtheria, and acellular pertussis (Tdap, Td) vaccine. You may need a Td booster every 10 years.  Zoster vaccine. You may need this after age 855.  Pneumococcal 13-valent conjugate (PCV13) vaccine. One dose is recommended after age 85.  Pneumococcal polysaccharide (PPSV23) vaccine. One dose is recommended after age 855. Talk to your health care provider about which screenings and vaccines you need and how often you need them. This information is not intended to replace advice given to you by your health care provider. Make sure you discuss any questions you have with your health care provider. Document Released: 12/08/2015 Document Revised: 07/31/2016 Document Reviewed: 09/12/2015 Elsevier Interactive Patient Education  2017 [place] Prevention in the Home Falls can cause injuries. They can happen to people of all ages. There are many things you can do to make your home safe and to help prevent falls. What can I do on the outside of my home?  Regularly fix the edges of walkways and driveways and fix any cracks.  Remove anything that might make you trip as you walk through a door, such as a raised step or threshold.  Trim any bushes or trees on the path to your home.  Use bright outdoor  lighting.  Clear any walking paths of anything that might make someone trip, such as rocks or tools.  Regularly check to see if handrails are loose or broken. Make sure that both sides of any steps have handrails.  Any raised decks and porches should have guardrails on the edges.  Have any leaves, snow, or ice cleared regularly.  Use sand or salt on walking paths during winter.  Clean up any spills in your garage right away. This includes oil or grease spills. What can I do in the bathroom?  Use night lights.  Install grab bars by the toilet and in the tub and shower. Do not use towel bars as grab bars.  Use non-skid mats or decals in the tub or shower.  If you need to sit down in the shower, use a plastic, non-slip stool.  Keep the floor dry. Clean up any water that spills on the floor as soon as it happens.  Remove soap buildup in the tub or shower regularly.  Attach bath mats securely with double-sided non-slip rug tape.  Do not have throw rugs and other things on the floor that can make you trip. What can I do in the bedroom?  Use night lights.  Make sure that you have a light by your bed that is easy to reach.  Do not use any sheets or blankets that are too big for your bed. They should not hang down onto the floor.  Have a firm chair that has side arms. You can use this for support while you get dressed.  Do not have throw rugs and other things on the floor that can make you trip. What can I do in the kitchen?  Clean up any spills right away.  Avoid walking on wet floors.  Keep items that you use a lot in easy-to-reach places.  If you need to reach something above you, use a strong step stool that has a grab bar.  Keep electrical cords out of the way.  Do not use floor polish or wax that makes floors slippery. If you must use wax, use non-skid floor wax.  Do not have throw rugs and other things on the floor that can make you trip. What can I do with my  stairs?  Do not leave any items on the stairs.  Make sure that there are handrails on both sides of the stairs and use them. Fix handrails that are broken or loose. Make sure that handrails are as long as the stairways.  Check any carpeting to make sure that it is firmly attached to the stairs. Fix any carpet that is loose or worn.  Avoid having throw rugs at the top or bottom of the stairs. If you do have throw rugs, attach them to the floor with carpet tape.  Make sure that you have a light switch at the top of the stairs and the bottom of the stairs. If you do not have them, ask someone to add them for you. What else can I do to help prevent falls?  Wear shoes that:  Do not have high heels.  Have rubber bottoms.  Are comfortable and fit you well.  Are closed at the toe. Do not wear sandals.  If you use a stepladder:  Make sure that it is fully opened. Do not climb a closed stepladder.  Make sure that both sides of the stepladder are locked into place.  Ask someone to hold it for you, if possible.  Clearly mark and make sure that you can see:  Any grab bars or handrails.  First and last steps.  Where the edge of each step is.  Use tools that help you move around (mobility aids) if they are needed. These include:  Canes.  Walkers.  Scooters.  Crutches.  Turn on the lights when you go into a dark area. Replace any light bulbs as soon as they burn out.  Set up your furniture so you have a clear path. Avoid moving your furniture around.  If any of your floors are uneven, fix them.  If there are any pets around you, be aware of where they are.  Review your medicines with your doctor. Some medicines can make you feel dizzy. This can increase your chance of falling. Ask your doctor what other things that you can do to help prevent falls. This information is not intended to replace advice given to you by your health care provider. Make sure you discuss any  questions you have with your health care provider. Document Released: 09/07/2009 Document Revised: 04/18/2016 Document Reviewed: 12/16/2014 Elsevier Interactive Patient Education  2017 [employer].

## 2021-02-23 ENCOUNTER — Other Ambulatory Visit: Payer: Self-pay | Admitting: Internal Medicine

## 2021-02-26 DIAGNOSIS — R972 Elevated prostate specific antigen [PSA]: Secondary | ICD-10-CM | POA: Diagnosis not present

## 2021-02-26 DIAGNOSIS — N401 Enlarged prostate with lower urinary tract symptoms: Secondary | ICD-10-CM | POA: Diagnosis not present

## 2021-02-27 ENCOUNTER — Other Ambulatory Visit: Payer: Self-pay

## 2021-02-27 ENCOUNTER — Ambulatory Visit (INDEPENDENT_AMBULATORY_CARE_PROVIDER_SITE_OTHER): Payer: PPO

## 2021-02-27 DIAGNOSIS — I5022 Chronic systolic (congestive) heart failure: Secondary | ICD-10-CM | POA: Diagnosis not present

## 2021-02-27 DIAGNOSIS — I255 Ischemic cardiomyopathy: Secondary | ICD-10-CM

## 2021-02-27 LAB — ECHOCARDIOGRAM COMPLETE
AR max vel: 1.83 cm2
AV Area VTI: 2 cm2
AV Area mean vel: 1.79 cm2
AV Mean grad: 4.5 mmHg
AV Peak grad: 9.1 mmHg
Ao pk vel: 1.51 m/s
Area-P 1/2: 4.46 cm2
Calc EF: 42.7 %
S' Lateral: 3.9 cm
Single Plane A2C EF: 39.9 %
Single Plane A4C EF: 45 %

## 2021-03-01 ENCOUNTER — Encounter: Payer: Self-pay | Admitting: Internal Medicine

## 2021-03-01 ENCOUNTER — Ambulatory Visit (INDEPENDENT_AMBULATORY_CARE_PROVIDER_SITE_OTHER): Payer: PPO | Admitting: Internal Medicine

## 2021-03-01 ENCOUNTER — Other Ambulatory Visit: Payer: Self-pay

## 2021-03-01 DIAGNOSIS — F419 Anxiety disorder, unspecified: Secondary | ICD-10-CM

## 2021-03-01 DIAGNOSIS — R413 Other amnesia: Secondary | ICD-10-CM

## 2021-03-01 DIAGNOSIS — R634 Abnormal weight loss: Secondary | ICD-10-CM | POA: Diagnosis not present

## 2021-03-01 DIAGNOSIS — I2510 Atherosclerotic heart disease of native coronary artery without angina pectoris: Secondary | ICD-10-CM

## 2021-03-01 MED ORDER — METOPROLOL SUCCINATE ER 50 MG PO TB24: ORAL_TABLET | ORAL | 3 refills | Status: DC

## 2021-03-01 MED ORDER — ESCITALOPRAM OXALATE 5 MG PO TABS: 5.0000 mg | ORAL_TABLET | Freq: Every day | ORAL | 5 refills | Status: DC

## 2021-03-01 NOTE — Assessment & Plan Note (Signed)
anxiety and depressed mood.  Probable underlying dementia.  Treat anxiety/insomnia

## 2021-03-01 NOTE — Progress Notes (Addendum)
Subjective:  Patient ID: [name] Christensen, male    DOB: 12/02/1931  Age: 87 y.o. MRN: [id number]  CC: Insomnia   HPI [name] Christensen presents for anxiety, insomnia,  depression, memory issues. He is here with his wife [name].  He came for his appointment several hours earlier.  We worked him in.  His son Shanon Brow tells me that [name] Christensen has not been interested in  reading, watching TV, maintaining conversations with the family that he used to enjoy.  He is not sleeping well.  He has been having memory issues.  The family thinks he is depressed.  Outpatient Medications Prior to Visit  Medication Sig Dispense Refill  . acetaminophen (TYLENOL) 500 MG tablet Take 500 mg by mouth at bedtime.    [name] alfuzosin (UROXATRAL) 10 MG 24 hr tablet Take 10 mg by mouth every evening.     [name] atorvastatin (LIPITOR) 20 MG tablet TAKE 1 TABLET BY MOUTH ONCE DAILY 90 tablet 3  . betamethasone dipropionate 0.05 % cream Apply topically 2 (two) times daily. 30 g 1  . cholecalciferol (VITAMIN D3) 10 MCG (400 UNIT) TABS tablet Take 1 tablet (400 Units total) by mouth daily. 30 tablet 11  . dofetilide (TIKOSYN) 250 MCG capsule TAKE ONE CAPSULE BY MOUTH TWICE A DAY 180 capsule 3  . dutasteride (AVODART) 0.5 MG capsule Take 0.5 mg by mouth every evening.     [name] ELIQUIS 5 MG TABS tablet TAKE 1 TABLET BY MOUTH TWICE (2) DAILY 180 tablet 3  . linaclotide (LINZESS) 290 MCG CAPS capsule Take 1 capsule (290 mcg total) by mouth daily as needed (constipation). 90 capsule 3  . losartan (COZAAR) 50 MG tablet Take 0.5 tablets (25 mg total) by mouth daily. 90 tablet 3  . metoprolol succinate (TOPROL-XL) 50 MG 24 hr tablet TAKE 1 TABLET BY MOUTH TWICE A DAY. TAKEWITH OR IMMEDIATELY FOLLOWING A MEAL 180 tablet 3  . Multiple Vitamin (MULTIVITAMIN WITH MINERALS) TABS tablet Take 1 tablet by mouth every other day.    . nitroGLYCERIN (NITROSTAT) 0.4 MG SL tablet As directed SL 25 tablet 3  . Polyethyl Glycol-Propyl Glycol (SYSTANE OP) Place 1 drop  into both eyes daily as needed (burning eyes).     . sacubitril-valsartan (ENTRESTO) 24-26 MG Take 1 tablet by mouth 2 (two) times daily. 60 tablet 6  . triamcinolone cream (KENALOG) 0.1 % APPLY 1 APPLICATION TOPICALLY TWO TIMES DAILY AS NEEDED AVOIDING FACE, GROIN ANDARMPITS 80 g 0  . vitamin B-12 (CYANOCOBALAMIN) 1000 MCG tablet Take 1,000 mcg by mouth daily.    . cinacalcet (SENSIPAR) 30 MG tablet Take 1 tablet (30 mg total) by mouth 3 (three) times a week. (Patient not taking: Reported on 03/01/2021) 12 tablet 5  . esomeprazole (NEXIUM) 20 MG capsule Take 1 capsule (20 mg total) by mouth daily at 12 noon. (Patient not taking: Reported on 03/01/2021) 30 capsule 3  . magnesium oxide (MAG-OX) 400 (241.3 Mg) MG tablet TAKE 1 TABLET BY MOUTH ONCE DAILY (Patient not taking: Reported on 03/01/2021) 30 tablet 6  . loratadine (CLARITIN) 10 MG tablet Take 1 tablet (10 mg total) by mouth daily. (Patient not taking: Reported on 03/01/2021) 30 tablet 11   No facility-administered medications prior to visit.    ROS: Review of Systems  Constitutional: Negative for appetite change, fatigue and unexpected weight change.  HENT: Negative for congestion, nosebleeds, sneezing, sore throat and trouble swallowing.   Eyes: Negative for itching and visual disturbance.  Respiratory: Negative for  cough.   Cardiovascular: Negative for chest pain, palpitations and leg swelling.  Gastrointestinal: Negative for abdominal distention, blood in stool, diarrhea and nausea.  Genitourinary: Negative for frequency and hematuria.  Musculoskeletal: Negative for back pain, gait problem, joint swelling and neck pain.  Skin: Negative for rash.  Neurological: Negative for dizziness, tremors, speech difficulty and weakness.  Psychiatric/Behavioral: Positive for confusion, dysphoric mood and sleep disturbance. Negative for agitation and suicidal ideas. The patient is nervous/anxious.     Objective:  BP 132/60 (BP Location: Left Arm)    Pulse 70   Temp 98.1 F (36.7 C) (Oral)   Ht 5\' 7"  (1.702 m)   Wt 143 lb 12.8 oz (65.2 kg)   SpO2 96%   BMI 22.52 kg/m   BP Readings from Last 3 Encounters:  03/01/21 132/60  01/29/21 (!) 130/58  01/24/21 (!) 152/70    Wt Readings from Last 3 Encounters:  03/01/21 143 lb 12.8 oz (65.2 kg)  01/29/21 143 lb 9.6 oz (65.1 kg)  01/24/21 143 lb (64.9 kg)    Physical Exam Constitutional:      General: He is not in acute distress.    Appearance: He is well-developed.     Comments: NAD  Eyes:     Conjunctiva/sclera: Conjunctivae normal.     Pupils: Pupils are equal, round, and reactive to light.  Neck:     Thyroid: No thyromegaly.     Vascular: No JVD.  Cardiovascular:     Rate and Rhythm: Normal rate and regular rhythm.     Heart sounds: Normal heart sounds. No murmur heard. No friction rub. No gallop.   Pulmonary:     Effort: Pulmonary effort is normal. No respiratory distress.     Breath sounds: Normal breath sounds. No wheezing or rales.  Chest:     Chest wall: No tenderness.  Abdominal:     General: Bowel sounds are normal. There is no distension.     Palpations: Abdomen is soft. There is no mass.     Tenderness: There is no abdominal tenderness. There is no guarding or rebound.  Musculoskeletal:        General: No tenderness. Normal range of motion.     Cervical back: Normal range of motion.  Lymphadenopathy:     Cervical: No cervical adenopathy.  Skin:    General: Skin is warm and dry.     Findings: No rash.  Neurological:     Mental Status: He is alert and oriented to person, place, and time.     Cranial Nerves: No cranial nerve deficit.     Motor: No abnormal muscle tone.     Coordination: Coordination normal.     Gait: Gait normal.     Deep Tendon Reflexes: Reflexes are normal and symmetric.  Psychiatric:        Behavior: Behavior normal.        Thought Content: Thought content normal.        Judgment: Judgment normal.     Lab Results  Component  Value Date   WBC 6.4 01/01/2021   HGB 12.8 (L) 01/01/2021   HCT 38.0 (L) 01/01/2021   PLT 121.0 (L) 01/01/2021   GLUCOSE 107 (H) 01/19/2021   CHOL 107 12/30/2019   TRIG 102.0 12/30/2019   HDL 46.80 12/30/2019   LDLCALC 39 12/30/2019   ALT 12 01/19/2021   AST 24 01/19/2021   NA 134 (L) 01/19/2021   K 4.9 01/19/2021   CL 99 01/19/2021   CREATININE  1.10 01/19/2021   BUN 17 01/19/2021   CO2 31 01/19/2021   TSH 2.51 12/30/2019   PSA 12.45 (H) 02/06/2016   INR 1.1 04/02/2017   HGBA1C 6.1 12/30/2019    No results found.  Assessment & Plan:    Walker Kehr, MD

## 2021-03-01 NOTE — Assessment & Plan Note (Addendum)
Anxiety and depressed mood.  Probable underlying dementia. Valerian root for anxiety, insomnia Lexapro low dose Lexapro low dose - start with 1/2 tablet at bedtime Return to clinic in 6 weeks

## 2021-03-01 NOTE — Assessment & Plan Note (Signed)
Atenolol, ASA, Plavix, Lipitor, Losartan Echo report was reviewed with the patient (where

## 2021-03-01 NOTE — Assessment & Plan Note (Signed)
Wt Readings from Last 3 Encounters:  03/01/21 143 lb 12.8 oz (65.2 kg)  01/29/21 143 lb 9.6 oz (65.1 kg)  01/24/21 143 lb (64.9 kg)

## 2021-03-01 NOTE — Patient Instructions (Signed)
Valerian root for anxiety, insomnia

## 2021-03-02 ENCOUNTER — Ambulatory Visit (INDEPENDENT_AMBULATORY_CARE_PROVIDER_SITE_OTHER): Payer: PPO | Admitting: Endocrinology

## 2021-03-02 LAB — BASIC METABOLIC PANEL
BUN: 15 mg/dL (ref 6–23)
CO2: 30 mEq/L (ref 19–32)
Calcium: 10.6 mg/dL — ABNORMAL HIGH (ref 8.4–10.5)
Chloride: 99 mEq/L (ref 96–112)
Creatinine, Ser: 0.97 mg/dL (ref 0.40–1.50)
GFR: 70.74 mL/min (ref >60.00)
Glucose, Bld: 101 mg/dL — ABNORMAL HIGH (ref 70–99)
Potassium: 4 mEq/L (ref 3.5–5.1)
Sodium: 132 mEq/L — ABNORMAL LOW (ref 135–145)

## 2021-03-02 LAB — VITAMIN D 25 HYDROXY (VIT D DEFICIENCY, FRACTURES): VITD: 42.78 ng/mL (ref 30.00–100.00)

## 2021-03-02 MED ORDER — CHOLECALCIFEROL 10 MCG (400 UNIT) PO TABS: 400.0000 [IU] | ORAL_TABLET | ORAL | 11 refills | Status: DC

## 2021-03-02 NOTE — Progress Notes (Signed)
Subjective:    Patient ID: [name] Christensen, male    DOB: December 12, 1931, 87 y.o.   MRN: [id number]  HPI Pt returns for f/u of hypocalcuric hypercalcemia (dx'ed 2016 (Ca++ was normal in 2015); other hypercalcemia w/u was neg; he has never had bony fx; due to memory loss, he was rx'ed cinacalcet in 2022).  pt states he feels well in general.  He has AF.  He takes Vit-D 400 units TIW, but no Ca++. He stopped cinacalcet 4 days ago, because he ran out.    Past Medical History:  Diagnosis Date  . Actinic keratosis 06/09/2019   L sup forehead at hairline - bx proven  . AICD (automatic cardioverter/defibrillator) present 04/07/2017  . Allergy   . Anxiety   . BPH (benign prostatic hypertrophy)    elevated PSA Dr. Lindell Noe Bx 2010  . Cardiac arrest [facility])    a. out-of-hospital arrest 10/25/2012 - EF 40-45%, patent grafts on cath, received [employer] AICD.  [name] Carotid disease, bilateral (HCC)    a. 0-39% by doppers.  . Cataract    bil cataracts removed  . CHF (congestive heart failure) (HCC)   . Coronary artery disease    a. s/p MI/CABG 2005. b. s/p cath at time of VF arrest 10/2013 - grafts patent.  . Cough   . Diverticulosis   . Elevated LFTs    a. 10/2012 felt due to cardiac arrest - Hepatitis C Ab reactive from 11/17/2012>>Hep C RNA PCR negative 11/16/2012.   [name] GERD (gastroesophageal reflux disease)   . Hyperlipidemia   . Hypertension   . Internal hemorrhoids   . Myocardial infarction (HCC)    2005 - CABG x 5 2005  . RBBB   . Tubular adenoma of colon   . Ventricular bigeminy    a. Event monitor 01/2013: NSR with PVCs and occ bigeminy.    Past Surgical History:  Procedure Laterality Date  . APPENDECTOMY    . CARDIAC CATHETERIZATION  2007   with patent graft anatomy atretic left internal mammary  artery to the LAD which is nonobstructive. Will restart study June 08, 2007  . CARDIOVERSION N/A 03/27/2018   Procedure: CARDIOVERSION;  Surgeon: Iran Ouch, MD;  Location: [facility] ORS;  Service:  Cardiovascular;  Laterality: N/A;  . CARDIOVERSION N/A 01/04/2019   Procedure: CARDIOVERSION (CATH LAB);  Surgeon: Iran Ouch, MD;  Location: [facility] ORS;  Service: Cardiovascular;  Laterality: N/A;  . CARDIOVERSION N/A 07/07/2019   Procedure: CARDIOVERSION;  Surgeon: Wendall Stade, MD;  Location: [facility] ENDOSCOPY;  Service: Cardiovascular;  Laterality: N/A;  . CATARACT EXTRACTION W/PHACO Right 04/23/2016   Procedure: CATARACT EXTRACTION PHACO AND INTRAOCULAR LENS PLACEMENT (IOC);  Surgeon: [name], MD;  Location: [facility] ORS;  Service: Ophthalmology;  Laterality: Right;  [place] 1.09 AP% 19.3 CDE 13.38 Fluid pack lot # [id number]  . CHOLECYSTECTOMY N/A 06/05/2017   Procedure: LAPAROSCOPIC CHOLECYSTECTOMY WITH INTRAOPERATIVE CHOLANGIOGRAM ERAS PATHWAY POSSIBLE NEEDLE CORE BIOPSY OF LIVER;  Surgeon: [name], MD;  Location: [facility] OR;  Service: General;  Laterality: N/A;  ERAS PATHWAY  . COLONOSCOPY    . CORONARY ARTERY BYPASS GRAFT  2005  . EYE SURGERY    . ICD GENERATOR CHANGEOUT N/A 04/07/2017   Procedure: ICD Generator Changeout;  Surgeon: Duke Salvia, MD;  Location: [facility] INVASIVE CV LAB;  Service: Cardiovascular;  Laterality: N/A;  . IMPLANTABLE CARDIOVERTER DEFIBRILLATOR IMPLANT N/A 11-13-12   STJ single chamber ICD implanted by Dr Graciela Husbands for cardiac arrest   .  INGUINAL HERNIA REPAIR Bilateral 01/17/2015   Procedure: BILATERAL LAPAROSCOPIC INGUINAL HERNIA REPAIR WITH LEFT FEMORAL HERNIA REPAIR;  Surgeon: [name], MD;  Location: [facility] OR;  Service: General;  Laterality: Bilateral;  . INSERTION OF MESH Bilateral 01/17/2015   Procedure: INSERTION OF MESH;  Surgeon: [name], MD;  Location: [facility] OR;  Service: General;  Laterality: Bilateral;  . LAPAROSCOPIC CHOLECYSTECTOMY  06/05/2017  . LEAD INSERTION N/A 04/07/2017   Procedure: RA Lead Insertion;  Surgeon: Duke Salvia, MD;  Location: [facility] INVASIVE CV LAB;  Service: Cardiovascular;  Laterality: N/A;  . LEAD REVISION/REPAIR N/A  04/08/2017   Procedure: Atrial Lead Revision/Repair;  Surgeon: Duke Salvia, MD;  Location: [facility] INVASIVE CV LAB;  Service: Cardiovascular;  Laterality: N/A;  . LEFT HEART CATH AND CORONARY ANGIOGRAPHY Left 05/14/2019   Procedure: LEFT HEART CATH AND CORONARY ANGIOGRAPHY;  Surgeon: Iran Ouch, MD;  Location: [facility] INVASIVE CV LAB;  Service: Cardiovascular;  Laterality: Left;  . LEFT HEART CATHETERIZATION WITH CORONARY/GRAFT ANGIOGRAM N/A 11/19/2012   Procedure: LEFT HEART CATHETERIZATION WITH Isabel Caprice;  Surgeon: [name], MD;  Location: [facility] CATH LAB;  Service: Cardiovascular;  Laterality: N/A;  . LIVER BIOPSY N/A 06/05/2017   Procedure: SINGLE SITE LIVER BIOPSY ERAS PATHWAY;  Surgeon: [name], MD;  Location: [facility] OR;  Service: General;  Laterality: N/A;  ERAS PATHWAY  . LUMBAR FUSION  09/2007    Social History   Socioeconomic History  . Marital status: Married    Spouse name: Not on file  . Number of children: 5  . Years of education: Not on file  . Highest education level: Not on file  Occupational History  . 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 Smoking status: Never Smoker  . Smokeless tobacco: Never Used  Vaping Use  . Vaping Use: Never used  Substance and Sexual Activity  . Alcohol use: No  . Drug use: No  . Sexual activity: Never  Other Topics Concern  . Not on file  Social History Narrative  . Not on file   Social Determinants of Health   Financial Resource Strain: Low Risk   . Difficulty of Paying Living Expenses: Not hard at all  Food Insecurity: No Food Insecurity  . Worried About Programme researcher, broadcasting/film/video in the Last Year: Never true  . Ran Out of Food in the Last Year: Never true  Transportation Needs: No Transportation Needs  . Lack of Transportation (Medical): No  . Lack of Transportation (Non-Medical): No  Physical Activity: Sufficiently Active  . Days of Exercise per Week: 6 days  . Minutes of Exercise  per Session: 30 min  Stress: Stress Concern Present  . Feeling of Stress : To some extent  Social Connections: Socially Integrated  . Frequency of Communication with Friends and Family: More than three times a week  . Frequency of Social Gatherings with Friends and Family: Once a week  . Attends Religious Services: More than 4 times per year  . Active Member of Clubs or Organizations: No  . Attends Banker Meetings: More than 4 times per year  . Marital Status: Married  Catering manager Violence: Not At Risk  . Fear of Current or Ex-Partner: No  . Emotionally Abused: No  . Physically Abused: No  . Sexually Abused: No    Current Outpatient Medications on File Prior to Visit  Medication Sig Dispense Refill  . acetaminophen (TYLENOL) 500 MG tablet Take 500  mg by mouth at bedtime.    [name] alfuzosin (UROXATRAL) 10 MG 24 hr tablet Take 10 mg by mouth every evening.     [name] atorvastatin (LIPITOR) 20 MG tablet TAKE 1 TABLET BY MOUTH ONCE DAILY 90 tablet 3  . betamethasone dipropionate 0.05 % cream Apply topically 2 (two) times daily. 30 g 1  . dofetilide (TIKOSYN) 250 MCG capsule TAKE ONE CAPSULE BY MOUTH TWICE A DAY 180 capsule 3  . dutasteride (AVODART) 0.5 MG capsule Take 0.5 mg by mouth every evening.     [name] ELIQUIS 5 MG TABS tablet TAKE 1 TABLET BY MOUTH TWICE (2) DAILY 180 tablet 3  . escitalopram (LEXAPRO) 5 MG tablet Take 1 tablet (5 mg total) by mouth daily. 30 tablet 5  . esomeprazole (NEXIUM) 20 MG capsule Take 1 capsule (20 mg total) by mouth daily at 12 noon. 30 capsule 3  . linaclotide (LINZESS) 290 MCG CAPS capsule Take 1 capsule (290 mcg total) by mouth daily as needed (constipation). 90 capsule 3  . losartan (COZAAR) 50 MG tablet Take 0.5 tablets (25 mg total) by mouth daily. 90 tablet 3  . magnesium oxide (MAG-OX) 400 (241.3 Mg) MG tablet TAKE 1 TABLET BY MOUTH ONCE DAILY 30 tablet 6  . metoprolol succinate (TOPROL-XL) 50 MG 24 hr tablet TAKE 1 TABLET BY MOUTH TWICE A  DAY. TAKEWITH OR IMMEDIATELY FOLLOWING A MEAL 180 tablet 3  . Multiple Vitamin (MULTIVITAMIN WITH MINERALS) TABS tablet Take 1 tablet by mouth every other day.    . nitroGLYCERIN (NITROSTAT) 0.4 MG SL tablet As directed SL 25 tablet 3  . Polyethyl Glycol-Propyl Glycol (SYSTANE OP) Place 1 drop into both eyes daily as needed (burning eyes).     . sacubitril-valsartan (ENTRESTO) 24-26 MG Take 1 tablet by mouth 2 (two) times daily. 60 tablet 6  . triamcinolone cream (KENALOG) 0.1 % APPLY 1 APPLICATION TOPICALLY TWO TIMES DAILY AS NEEDED AVOIDING FACE, GROIN ANDARMPITS 80 g 0  . vitamin B-12 (CYANOCOBALAMIN) 1000 MCG tablet Take 1,000 mcg by mouth daily.     No current facility-administered medications on file prior to visit.    Allergies  Allergen Reactions  . Ezetimibe Other (See Comments)    Pt was in coma for 6 days, numbness  . Penicillins Other (See Comments)    BLISTERS BETWEEN FINGERS Did it involve swelling of the face/tongue/throat, SOB, or low BP? No Did it involve sudden or severe rash/hives, skin peeling, or any reaction on the inside of your mouth or nose? Yes Did you need to seek medical attention at a hospital or doctor's office? No When did it last happen?60 years If all above answers are "NO", may proceed with cephalosporin use.   . Pravastatin Sodium Other (See Comments)    Aches and pains in joints  . Rosuvastatin Other (See Comments)    MYALGIAS  . Niacin Anxiety and Other (See Comments)    "Makes me feel nervous, jittery"    Family History  Problem Relation Age of Onset  . Coronary artery disease Mother   . Heart attack Brother   . Stomach cancer Neg Hx   . Colon cancer Neg Hx   . Esophageal cancer Neg Hx   . Pancreatic cancer Neg Hx   . Prostate cancer Neg Hx   . Rectal cancer Neg Hx   . Hypercalcemia Neg Hx     BP 128/60 (BP Location: Right Arm, Patient Position: Sitting, Cuff Size: Normal)   Pulse 80  Ht 5\' 7"  (1.702 m)   Wt 145 lb (65.8 kg)    SpO2 96%   BMI 22.71 kg/m    Review of Systems     Objective:   Physical Exam    Lab Results  Component Value Date   PTH 47 01/24/2021   CALCIUM 10.6 (H) 03/02/2021   CAION 1.40 (H) 04/06/2013   PHOS 2.6 11/15/2012       Assessment & Plan:  Hyperparathyroidism: uncontrolled. Please continue the same cinacalcet for now Vit-D def: recheck today.

## 2021-03-02 NOTE — Patient Instructions (Addendum)
Blood tests are requested for you today.  We'll let you know about the results.   Please come back for a follow-up appointment in 6 weeks.  

## 2021-03-02 NOTE — Progress Notes (Signed)
poc

## 2021-03-03 MED ORDER — CINACALCET HCL 30 MG PO TABS: 30.0000 mg | ORAL_TABLET | ORAL | 5 refills | Status: DC

## 2021-03-06 ENCOUNTER — Other Ambulatory Visit: Payer: Self-pay

## 2021-03-06 ENCOUNTER — Ambulatory Visit (INDEPENDENT_AMBULATORY_CARE_PROVIDER_SITE_OTHER): Payer: PPO | Admitting: Internal Medicine

## 2021-03-06 ENCOUNTER — Encounter: Payer: Self-pay | Admitting: Internal Medicine

## 2021-03-06 VITALS — BP 118/56 | HR 71 | Ht 67.0 in | Wt 147.0 lb

## 2021-03-06 DIAGNOSIS — Z95810 Presence of automatic (implantable) cardiac defibrillator: Secondary | ICD-10-CM | POA: Diagnosis not present

## 2021-03-06 DIAGNOSIS — I5022 Chronic systolic (congestive) heart failure: Secondary | ICD-10-CM | POA: Diagnosis not present

## 2021-03-06 DIAGNOSIS — I493 Ventricular premature depolarization: Secondary | ICD-10-CM | POA: Diagnosis not present

## 2021-03-06 DIAGNOSIS — Z79899 Other long term (current) drug therapy: Secondary | ICD-10-CM

## 2021-03-06 DIAGNOSIS — I4819 Other persistent atrial fibrillation: Secondary | ICD-10-CM

## 2021-03-06 DIAGNOSIS — I255 Ischemic cardiomyopathy: Secondary | ICD-10-CM

## 2021-03-06 NOTE — Patient Instructions (Signed)
Medication Instructions:  - Your physician recommends that you continue on your current medications as directed. Please refer to the Current Medication list given to you today.  *If you need a refill on your cardiac medications before your next appointment, please call your pharmacy*   Lab Work: - Your physician recommends that you have lab work today: Magnesium  If you have labs (blood work) drawn today and your tests are completely normal, you will receive your results only by: [name] MyChart Message (if you have MyChart) OR . A paper copy in the mail If you have any lab test that is abnormal or we need to change your treatment, we will call you to review the results.   Testing/Procedures: - none ordered   Follow-Up: At [facility], you and your health needs are our priority.  As part of our continuing mission to provide you with exceptional heart care, we have created designated Provider Care Teams.  These Care Teams include your primary Cardiologist (physician) and Advanced Practice Providers (APPs -  Physician Assistants and Nurse Practitioners) who all work together to provide you with the care you need, when you need it.  We recommend signing up for the patient portal called "MyChart".  Sign up information is provided on this After Visit Summary.  MyChart is used to connect with patients for Virtual Visits (Telemedicine).  Patients are able to view lab/test results, encounter notes, upcoming appointments, etc.  Non-urgent messages can be sent to your provider as well.   To learn more about what you can do with MyChart, go to [email or web address].    Your next appointment:   6 month(s)  The format for your next appointment:   In Person  Provider:   Virl Axe, MD   Other Instructions

## 2021-03-06 NOTE — Progress Notes (Signed)
Patient Care Team: Plotnikov, Georgina Quint, MD as PCP - General (Internal Medicine) Wendall Stade, MD as PCP - Cardiology (Cardiology) Duke Salvia, MD as PCP - Electrophysiology (Cardiology) Lindell Noe Lehman Prom., MD (Urology) [name], MD as Consulting Physician (General Surgery) Pyrtle, Carie Caddy, MD as Consulting Physician (Gastroenterology) [name], MD as Referring Physician (Ophthalmology)   HPI  [name] Christensen is a 87 y.o. male   seen in follow-up for ischemic cardiomyopathy with progressive left ventricular dysfunction in the context of frequent PVCs and chronic congestive heart failure.  He has persistent atrial fibrillation for which he was started on dofetilide 8/20    Have been considering AFib, Pacing and PVCs as cause of his symptoms and LV dtysfunction>> reprogramming of his device eliminated pacing ( RBBB), dofetilide >>sinus, so the PVCs remain unaddressed with failed Anti-arrhythmic drugs suppression.  Most recently tried to increase rate for overdrive suppression, with some concern about encroaching on AV conduction and triggering RV apical pacing  His family remains concerned about lassitude and depression.  The children have visited and they concur; this came up in his visit with his PCP and created a fair amount of consternation on the part of the patient. There are issues related to retirement in the sense of "worthlessness " identity etc. And   also acknowledgments of mortality.  No chest pain.  Shortness of breath is minimal.  He is exercising regularly and walking a couple of miles.  No nocturnal dyspnea orthopnea.  No palpitations.  On anticoagulation without bleeding.      DATE TEST PVC  1/15 Holter 13%  1/18 Holter 31%  2/20 ZIO 7.2% ( all one morphology)   9/20 Pacer 12%  11/20 Pacer 11%  2/21 Pacer 9.4%  9/21  13%     DATE TEST EF   12/13 Cath   Patent Grafts  2/15 Echo   45-50 %   12/17 Echo   50-55 %   4/19 Echo  20-25%   10/19 Echo  25-30%   1/20 Echo  20-25%   6/20 LHC 20-25% LIMA-LADatretic SVG-OM,SVG-D with filling of LAD,SVG-OM patent  11/20 Echo  25-30%   4/22 Echo  40-45%    Date Cr K Mg Hgb  6/20 1.14 4.9 2.0(3/20) 12.5   8/20 1.03 4.2 1.8 12.8  2/21 1.06 4.0 2.1 12.7  7/21 1.19 4.8    9/21 1.14 5.0 2.1 11.3  4/22 0.97 4.0  12.8   Antiarrhythmics Date Reason stopped  amio 7/19    dofetilide          Records and Results Reviewed *  Past Medical History:  Diagnosis Date  . Actinic keratosis 06/09/2019   L sup forehead at hairline - bx proven  . AICD (automatic cardioverter/defibrillator) present 04/07/2017  . Allergy   . Anxiety   . BPH (benign prostatic hypertrophy)    elevated PSA Dr. Lindell Noe Bx 2010  . Cardiac arrest [facility])    a. out-of-hospital arrest 11/22/12 - EF 40-45%, patent grafts on cath, received St[employer] Jude AICD.  [name] Carotid disease, bilateral (HCC)    a. 0-39% by doppers.  . Cataract    bil cataracts removed  . CHF (congestive heart failure) (HCC)   . Coronary artery disease    a. s/p MI/CABG 2005. b. s/p cath at time of VF arrest 10/2013 - grafts patent.  . Cough   . Diverticulosis   . Elevated LFTs    a. 10/2012  felt due to cardiac arrest - Hepatitis C Ab reactive from 10/30/2012>>Hep C RNA PCR negative 10/27/2012.   [name] GERD (gastroesophageal reflux disease)   . Hyperlipidemia   . Hypertension   . Internal hemorrhoids   . Myocardial infarction (HCC)    2005 - CABG x 5 2005  . RBBB   . Tubular adenoma of colon   . Ventricular bigeminy    a. Event monitor 01/2013: NSR with PVCs and occ bigeminy.    Past Surgical History:  Procedure Laterality Date  . APPENDECTOMY    . CARDIAC CATHETERIZATION  2007   with patent graft anatomy atretic left internal mammary  artery to the LAD which is nonobstructive. Will restart study June 08, 2007  . CARDIOVERSION N/A 03/27/2018   Procedure: CARDIOVERSION;  Surgeon: Iran Ouch, MD;  Location: [facility] ORS;   Service: Cardiovascular;  Laterality: N/A;  . CARDIOVERSION N/A 01/04/2019   Procedure: CARDIOVERSION (CATH LAB);  Surgeon: Iran Ouch, MD;  Location: [facility] ORS;  Service: Cardiovascular;  Laterality: N/A;  . CARDIOVERSION N/A 07/07/2019   Procedure: CARDIOVERSION;  Surgeon: Wendall Stade, MD;  Location: [facility] ENDOSCOPY;  Service: Cardiovascular;  Laterality: N/A;  . CATARACT EXTRACTION W/PHACO Right 04/23/2016   Procedure: CATARACT EXTRACTION PHACO AND INTRAOCULAR LENS PLACEMENT (IOC);  Surgeon: [name], MD;  Location: [facility] ORS;  Service: Ophthalmology;  Laterality: Right;  [place] 1.09 AP% 19.3 CDE 13.38 Fluid pack lot # [id number]  . CHOLECYSTECTOMY N/A 06/05/2017   Procedure: LAPAROSCOPIC CHOLECYSTECTOMY WITH INTRAOPERATIVE CHOLANGIOGRAM ERAS PATHWAY POSSIBLE NEEDLE CORE BIOPSY OF LIVER;  Surgeon: [name], MD;  Location: [facility] OR;  Service: General;  Laterality: N/A;  ERAS PATHWAY  . COLONOSCOPY    . CORONARY ARTERY BYPASS GRAFT  2005  . EYE SURGERY    . ICD GENERATOR CHANGEOUT N/A 04/07/2017   Procedure: ICD Generator Changeout;  Surgeon: Duke Salvia, MD;  Location: [facility] INVASIVE CV LAB;  Service: Cardiovascular;  Laterality: N/A;  . IMPLANTABLE CARDIOVERTER DEFIBRILLATOR IMPLANT N/A 11/19/2012   STJ single chamber ICD implanted by Dr Graciela Husbands for cardiac arrest   . INGUINAL HERNIA REPAIR Bilateral 01/17/2015   Procedure: BILATERAL LAPAROSCOPIC INGUINAL HERNIA REPAIR WITH LEFT FEMORAL HERNIA REPAIR;  Surgeon: [name], MD;  Location: [facility] OR;  Service: General;  Laterality: Bilateral;  . INSERTION OF MESH Bilateral 01/17/2015   Procedure: INSERTION OF MESH;  Surgeon: [name], MD;  Location: [facility] OR;  Service: General;  Laterality: Bilateral;  . LAPAROSCOPIC CHOLECYSTECTOMY  06/05/2017  . LEAD INSERTION N/A 04/07/2017   Procedure: RA Lead Insertion;  Surgeon: Duke Salvia, MD;  Location: [facility] INVASIVE CV LAB;  Service: Cardiovascular;  Laterality: N/A;  . LEAD REVISION/REPAIR N/A  04/08/2017   Procedure: Atrial Lead Revision/Repair;  Surgeon: Duke Salvia, MD;  Location: [facility] INVASIVE CV LAB;  Service: Cardiovascular;  Laterality: N/A;  . LEFT HEART CATH AND CORONARY ANGIOGRAPHY Left 05/14/2019   Procedure: LEFT HEART CATH AND CORONARY ANGIOGRAPHY;  Surgeon: Iran Ouch, MD;  Location: [facility] INVASIVE CV LAB;  Service: Cardiovascular;  Laterality: Left;  . LEFT HEART CATHETERIZATION WITH CORONARY/GRAFT ANGIOGRAM N/A 11/15/2012   Procedure: LEFT HEART CATHETERIZATION WITH Isabel Caprice;  Surgeon: [name], MD;  Location: [facility] CATH LAB;  Service: Cardiovascular;  Laterality: N/A;  . LIVER BIOPSY N/A 06/05/2017   Procedure: SINGLE SITE LIVER BIOPSY ERAS PATHWAY;  Surgeon: [name], MD;  Location: [facility] OR;  Service: General;  Laterality: N/A;  ERAS PATHWAY  . LUMBAR FUSION  09/2007    Current Meds  Medication Sig  . acetaminophen (TYLENOL) 500 MG tablet Take 500 mg by mouth at bedtime.  [name] alfuzosin (UROXATRAL) 10 MG 24 hr tablet Take 10 mg by mouth every evening.   [name] atorvastatin (LIPITOR) 20 MG tablet TAKE 1 TABLET BY MOUTH ONCE DAILY  . betamethasone dipropionate 0.05 % cream Apply topically 2 (two) times daily.  . cholecalciferol (VITAMIN D3) 10 MCG (400 UNIT) TABS tablet Take 1 tablet (400 Units total) by mouth 3 (three) times a week.  . cinacalcet (SENSIPAR) 30 MG tablet Take 1 tablet (30 mg total) by mouth 3 (three) times a week.  . dofetilide (TIKOSYN) 250 MCG capsule TAKE ONE CAPSULE BY MOUTH TWICE A DAY  . dutasteride (AVODART) 0.5 MG capsule Take 0.5 mg by mouth every evening.   [name] ELIQUIS 5 MG TABS tablet TAKE 1 TABLET BY MOUTH TWICE (2) DAILY  . escitalopram (LEXAPRO) 5 MG tablet Take 1 tablet (5 mg total) by mouth daily.  [name] esomeprazole (NEXIUM) 20 MG capsule Take 1 capsule (20 mg total) by mouth daily at 12 noon.  . linaclotide (LINZESS) 290 MCG CAPS capsule Take 1 capsule (290 mcg total) by mouth daily as needed (constipation).  [name]  losartan (COZAAR) 50 MG tablet Take 0.5 tablets (25 mg total) by mouth daily.  . magnesium oxide (MAG-OX) 400 (241.3 Mg) MG tablet TAKE 1 TABLET BY MOUTH ONCE DAILY  . metoprolol succinate (TOPROL-XL) 50 MG 24 hr tablet TAKE 1 TABLET BY MOUTH TWICE A DAY. TAKEWITH OR IMMEDIATELY FOLLOWING A MEAL  . Multiple Vitamin (MULTIVITAMIN WITH MINERALS) TABS tablet Take 1 tablet by mouth every other day.  . nitroGLYCERIN (NITROSTAT) 0.4 MG SL tablet As directed SL  . Polyethyl Glycol-Propyl Glycol (SYSTANE OP) Place 1 drop into both eyes daily as needed (burning eyes).   . sacubitril-valsartan (ENTRESTO) 24-26 MG Take 1 tablet by mouth 2 (two) times daily.  [name] triamcinolone cream (KENALOG) 0.1 % APPLY 1 APPLICATION TOPICALLY TWO TIMES DAILY AS NEEDED AVOIDING FACE, GROIN ANDARMPITS  . vitamin B-12 (CYANOCOBALAMIN) 1000 MCG tablet Take 1,000 mcg by mouth daily.   Current Outpatient Medications on File Prior to Visit  Medication Sig Dispense Refill  . acetaminophen (TYLENOL) 500 MG tablet Take 500 mg by mouth at bedtime.    [name] alfuzosin (UROXATRAL) 10 MG 24 hr tablet Take 10 mg by mouth every evening.     [name] atorvastatin (LIPITOR) 20 MG tablet TAKE 1 TABLET BY MOUTH ONCE DAILY 90 tablet 3  . betamethasone dipropionate 0.05 % cream Apply topically 2 (two) times daily. 30 g 1  . cholecalciferol (VITAMIN D3) 10 MCG (400 UNIT) TABS tablet Take 1 tablet (400 Units total) by mouth 3 (three) times a week. 30 tablet 11  . cinacalcet (SENSIPAR) 30 MG tablet Take 1 tablet (30 mg total) by mouth 3 (three) times a week. 30 tablet 5  . dofetilide (TIKOSYN) 250 MCG capsule TAKE ONE CAPSULE BY MOUTH TWICE A DAY 180 capsule 3  . dutasteride (AVODART) 0.5 MG capsule Take 0.5 mg by mouth every evening.     [name] ELIQUIS 5 MG TABS tablet TAKE 1 TABLET BY MOUTH TWICE (2) DAILY 180 tablet 3  . escitalopram (LEXAPRO) 5 MG tablet Take 1 tablet (5 mg total) by mouth daily. 30 tablet 5  . esomeprazole (NEXIUM) 20 MG capsule Take 1  capsule (20 mg total) by mouth daily at 12 noon. 30 capsule 3  . linaclotide (LINZESS) 290 MCG CAPS  capsule Take 1 capsule (290 mcg total) by mouth daily as needed (constipation). 90 capsule 3  . losartan (COZAAR) 50 MG tablet Take 0.5 tablets (25 mg total) by mouth daily. 90 tablet 3  . magnesium oxide (MAG-OX) 400 (241.3 Mg) MG tablet TAKE 1 TABLET BY MOUTH ONCE DAILY 30 tablet 6  . metoprolol succinate (TOPROL-XL) 50 MG 24 hr tablet TAKE 1 TABLET BY MOUTH TWICE A DAY. TAKEWITH OR IMMEDIATELY FOLLOWING A MEAL 180 tablet 3  . Multiple Vitamin (MULTIVITAMIN WITH MINERALS) TABS tablet Take 1 tablet by mouth every other day.    . nitroGLYCERIN (NITROSTAT) 0.4 MG SL tablet As directed SL 25 tablet 3  . Polyethyl Glycol-Propyl Glycol (SYSTANE OP) Place 1 drop into both eyes daily as needed (burning eyes).     . sacubitril-valsartan (ENTRESTO) 24-26 MG Take 1 tablet by mouth 2 (two) times daily. 60 tablet 6  . triamcinolone cream (KENALOG) 0.1 % APPLY 1 APPLICATION TOPICALLY TWO TIMES DAILY AS NEEDED AVOIDING FACE, GROIN ANDARMPITS 80 g 0  . vitamin B-12 (CYANOCOBALAMIN) 1000 MCG tablet Take 1,000 mcg by mouth daily.     No current facility-administered medications on file prior to visit.     Allergies  Allergen Reactions  . Ezetimibe Other (See Comments)    Pt was in coma for 6 days, numbness  . Penicillins Other (See Comments)    BLISTERS BETWEEN FINGERS Did it involve swelling of the face/tongue/throat, SOB, or low BP? No Did it involve sudden or severe rash/hives, skin peeling, or any reaction on the inside of your mouth or nose? Yes Did you need to seek medical attention at a hospital or doctor's office? No When did it last happen?60 years If all above answers are "NO", may proceed with cephalosporin use.   . Pravastatin Sodium Other (See Comments)    Aches and pains in joints  . Rosuvastatin Other (See Comments)    MYALGIAS  . Niacin Anxiety and Other (See Comments)    "Makes  me feel nervous, jittery"      Review of Systems negative except from HPI and PMH  Physical Exam BP (!) 118/56   Pulse 71   Ht 5\' 7"  (1.702 m)   Wt 147 lb (66.7 kg)   BMI 23.02 kg/m  Well developed and well nourished in no acute distress HENT normal Neck supple with JVP-flat Clear Device pocket well healed; without hematoma or erythema.  There is no tethering  Irregular rate and rhythm, no  murmur Abd-soft with active BS No Clubbing cyanosis  edema Skin-warm and dry A & Oriented  Grossly normal sensory and motor function  ECG atrial pacing with intrinsic conduction frequent PVCs     ASSESSMENT & PLAN:    Atrial fibrillation/flutter-persistent  PVCs-right bundle branch superior axis --identified by his device by ventricular rates slower than paced rate of 70  Cardiomyopathy recurrent  History of aborted sudden cardiac death    Implantable defibrillator-St. Jude      Ischemic cardiomyopathy with prior CABG    Congestive Heart Failure chronic systolic  High Risk Medication Surveillance,Dofetilide  Right bundle branch block.  Anemia  Sinus bradycardia/chronotropic incompetence   Ventricular pacing at about 2%.  Concurrent with this has been significant improvement in LV systolic function now 40-45%, not withstanding the PVCs.  We will continue current medications.  Without symptoms of ischemia  Euvolemic.  We will continue with current heart failure medications.  Lengthy discussion regarding the stresses of retirement, concerns of the family  regarding depression, anxieties related to mortality.                Sherryl Manges, MD  03/06/2021 10:27 AM     [facility] [address] [place] [address] [phone] (office) [phone] (fax) yes.

## 2021-03-07 LAB — MAGNESIUM: Magnesium: 1.9 mg/dL (ref 1.6–2.3)

## 2021-03-09 NOTE — Addendum Note (Signed)
Addended by: Ernie Hew D on: 03/09/2021 01:39 PM   Modules accepted: Orders

## 2021-03-20 ENCOUNTER — Other Ambulatory Visit: Payer: Self-pay | Admitting: Internal Medicine

## 2021-03-21 DIAGNOSIS — H26492 Other secondary cataract, left eye: Secondary | ICD-10-CM | POA: Diagnosis not present

## 2021-03-21 DIAGNOSIS — H43813 Vitreous degeneration, bilateral: Secondary | ICD-10-CM | POA: Diagnosis not present

## 2021-04-02 ENCOUNTER — Ambulatory Visit (INDEPENDENT_AMBULATORY_CARE_PROVIDER_SITE_OTHER): Payer: PPO | Admitting: Internal Medicine

## 2021-04-02 ENCOUNTER — Encounter: Payer: Self-pay | Admitting: Internal Medicine

## 2021-04-02 ENCOUNTER — Other Ambulatory Visit: Payer: Self-pay

## 2021-04-02 DIAGNOSIS — I2510 Atherosclerotic heart disease of native coronary artery without angina pectoris: Secondary | ICD-10-CM

## 2021-04-02 DIAGNOSIS — R453 Demoralization and apathy: Secondary | ICD-10-CM | POA: Diagnosis not present

## 2021-04-02 DIAGNOSIS — I700 Atherosclerosis of aorta: Secondary | ICD-10-CM

## 2021-04-02 DIAGNOSIS — R413 Other amnesia: Secondary | ICD-10-CM | POA: Diagnosis not present

## 2021-04-02 NOTE — Assessment & Plan Note (Signed)
A little better, less apathetic On Lexapro

## 2021-04-02 NOTE — Assessment & Plan Note (Addendum)
A little better, less apathetic Get hearing aids

## 2021-04-02 NOTE — Assessment & Plan Note (Signed)
Atenolol, ASA, Plavix, Lipitor, Losartan No angina

## 2021-04-02 NOTE — Progress Notes (Signed)
Subjective:  Patient ID: [name] Christensen, male    DOB: [birth year]  Age: 87 y.o. MRN: [id number]  CC: Follow-up (3 month f/u)   HPI [name] Christensen presents for anxiety, memory loss, apathy   Outpatient Medications Prior to Visit  Medication Sig Dispense Refill  . acetaminophen (TYLENOL) 500 MG tablet Take 500 mg by mouth at bedtime.    [name] alfuzosin (UROXATRAL) 10 MG 24 hr tablet Take 10 mg by mouth every evening.     [name] atorvastatin (LIPITOR) 20 MG tablet TAKE 1 TABLET BY MOUTH ONCE DAILY 90 tablet 3  . betamethasone dipropionate 0.05 % cream Apply topically 2 (two) times daily. 30 g 1  . cholecalciferol (VITAMIN D3) 10 MCG (400 UNIT) TABS tablet Take 1 tablet (400 Units total) by mouth 3 (three) times a week. 30 tablet 11  . cinacalcet (SENSIPAR) 30 MG tablet Take 1 tablet (30 mg total) by mouth 3 (three) times a week. 30 tablet 5  . dofetilide (TIKOSYN) 250 MCG capsule TAKE ONE CAPSULE BY MOUTH TWICE A DAY 180 capsule 3  . dutasteride (AVODART) 0.5 MG capsule Take 0.5 mg by mouth every evening.     [name] ELIQUIS 5 MG TABS tablet TAKE 1 TABLET BY MOUTH TWICE (2) DAILY 180 tablet 3  . ENTRESTO 24-26 MG TAKE 1 TABLET BY MOUTH TWICE A DAY 180 tablet 3  . escitalopram (LEXAPRO) 5 MG tablet Take 1 tablet (5 mg total) by mouth daily. 30 tablet 5  . esomeprazole (NEXIUM) 20 MG capsule Take 1 capsule (20 mg total) by mouth daily at 12 noon. 30 capsule 3  . linaclotide (LINZESS) 290 MCG CAPS capsule Take 1 capsule (290 mcg total) by mouth daily as needed (constipation). 90 capsule 3  . losartan (COZAAR) 50 MG tablet Take 0.5 tablets (25 mg total) by mouth daily. 90 tablet 3  . magnesium oxide (MAG-OX) 400 (241.3 Mg) MG tablet TAKE 1 TABLET BY MOUTH ONCE DAILY 30 tablet 6  . metoprolol succinate (TOPROL-XL) 50 MG 24 hr tablet TAKE 1 TABLET BY MOUTH TWICE A DAY. TAKEWITH OR IMMEDIATELY FOLLOWING A MEAL 180 tablet 3  . Multiple Vitamin (MULTIVITAMIN WITH MINERALS) TABS tablet Take 1 tablet by mouth every  other day.    . nitroGLYCERIN (NITROSTAT) 0.4 MG SL tablet As directed SL 25 tablet 3  . Polyethyl Glycol-Propyl Glycol (SYSTANE OP) Place 1 drop into both eyes daily as needed (burning eyes).     . triamcinolone cream (KENALOG) 0.1 % APPLY 1 APPLICATION TOPICALLY TWO TIMES DAILY AS NEEDED AVOIDING FACE, GROIN ANDARMPITS 80 g 0  . vitamin B-12 (CYANOCOBALAMIN) 1000 MCG tablet Take 1,000 mcg by mouth daily.     No facility-administered medications prior to visit.    ROS: Review of Systems  Constitutional: Negative for appetite change, fatigue and unexpected weight change.  HENT: Negative for congestion, nosebleeds, sneezing, sore throat and trouble swallowing.   Eyes: Negative for itching and visual disturbance.  Respiratory: Negative for cough.   Cardiovascular: Negative for chest pain, palpitations and leg swelling.  Gastrointestinal: Negative for abdominal distention, blood in stool, diarrhea and nausea.  Genitourinary: Negative for frequency and hematuria.  Musculoskeletal: Negative for back pain, gait problem, joint swelling and neck pain.  Skin: Negative for rash.  Neurological: Negative for dizziness, tremors, speech difficulty and weakness.  Psychiatric/Behavioral: Negative for agitation, dysphoric mood and sleep disturbance. The patient is not nervous/anxious.     Objective:  BP 130/62 (BP Location: Left Arm)  Pulse 69   Temp 98.2 F (36.8 C) (Oral)   Ht 5\' 7"  (1.702 m)   Wt 142 lb 3.2 oz (64.5 kg)   SpO2 96%   BMI 22.27 kg/m   BP Readings from Last 3 Encounters:  04/02/21 130/62  03/06/21 (!) 118/56  03/02/21 128/60    Wt Readings from Last 3 Encounters:  04/02/21 142 lb 3.2 oz (64.5 kg)  03/06/21 147 lb (66.7 kg)  03/02/21 145 lb (65.8 kg)    Physical Exam Constitutional:      General: He is not in acute distress.    Appearance: He is well-developed.     Comments: NAD  Eyes:     Conjunctiva/sclera: Conjunctivae normal.     Pupils: Pupils are equal,  round, and reactive to light.  Neck:     Thyroid: No thyromegaly.     Vascular: No JVD.  Cardiovascular:     Rate and Rhythm: Normal rate and regular rhythm.     Heart sounds: Normal heart sounds. No murmur heard. No friction rub. No gallop.   Pulmonary:     Effort: Pulmonary effort is normal. No respiratory distress.     Breath sounds: Normal breath sounds. No wheezing or rales.  Chest:     Chest wall: No tenderness.  Abdominal:     General: Bowel sounds are normal. There is no distension.     Palpations: Abdomen is soft. There is no mass.     Tenderness: There is no abdominal tenderness. There is no guarding or rebound.  Musculoskeletal:        General: No tenderness. Normal range of motion.     Cervical back: Normal range of motion.  Lymphadenopathy:     Cervical: No cervical adenopathy.  Skin:    General: Skin is warm and dry.     Findings: No rash.  Neurological:     Mental Status: He is alert and oriented to person, place, and time.     Cranial Nerves: No cranial nerve deficit.     Motor: No abnormal muscle tone.     Coordination: Coordination normal.     Gait: Gait normal.     Deep Tendon Reflexes: Reflexes are normal and symmetric.  Psychiatric:        Behavior: Behavior normal.        Thought Content: Thought content normal.        Judgment: Judgment normal.   pt looks better  Lab Results  Component Value Date   WBC 6.4 01/01/2021   HGB 12.8 (L) 01/01/2021   HCT 38.0 (L) 01/01/2021   PLT 121.0 (L) 01/01/2021   GLUCOSE 101 (H) 03/02/2021   CHOL 107 12/30/2019   TRIG 102.0 12/30/2019   HDL 46.80 12/30/2019   LDLCALC 39 12/30/2019   ALT 12 01/19/2021   AST 24 01/19/2021   NA 132 (L) 03/02/2021   K 4.0 03/02/2021   CL 99 03/02/2021   CREATININE 0.97 03/02/2021   BUN 15 03/02/2021   CO2 30 03/02/2021   TSH 2.51 12/30/2019   PSA 12.45 (H) 02/06/2016   INR 1.1 04/02/2017   HGBA1C 6.1 12/30/2019    No results found.  Assessment & Plan:    Walker Kehr, MD

## 2021-04-02 NOTE — Assessment & Plan Note (Signed)
On Lipitor 

## 2021-04-04 ENCOUNTER — Ambulatory Visit (INDEPENDENT_AMBULATORY_CARE_PROVIDER_SITE_OTHER): Payer: PPO

## 2021-04-04 DIAGNOSIS — I255 Ischemic cardiomyopathy: Secondary | ICD-10-CM | POA: Diagnosis not present

## 2021-04-05 LAB — CUP PACEART REMOTE DEVICE CHECK
Battery Remaining Longevity: 43 mo
Battery Remaining Percentage: 51 %
Battery Voltage: 2.93 V
Brady Statistic AP VP Percent: 6.4 %
Brady Statistic AP VS Percent: 87 %
Brady Statistic AS VP Percent: 1 %
Brady Statistic AS VS Percent: 6.1 %
Brady Statistic RA Percent Paced: 90 %
Brady Statistic RV Percent Paced: 6.5 %
Date Time Interrogation Session: 20220511020027
HighPow Impedance: 65 Ohm
HighPow Impedance: 65 Ohm
Implantable Lead Implant Date: 20131218
Implantable Lead Implant Date: 20180514
Implantable Lead Location: 753859
Implantable Lead Location: 753860
Implantable Lead Model: 5076
Implantable Pulse Generator Implant Date: 20180514
Lead Channel Impedance Value: 390 Ohm
Lead Channel Impedance Value: 450 Ohm
Lead Channel Pacing Threshold Amplitude: 0.5 V
Lead Channel Pacing Threshold Amplitude: 1.5 V
Lead Channel Pacing Threshold Pulse Width: 0.5 ms
Lead Channel Pacing Threshold Pulse Width: 0.5 ms
Lead Channel Sensing Intrinsic Amplitude: 3.2 mV
Lead Channel Sensing Intrinsic Amplitude: 3.9 mV
Lead Channel Setting Pacing Amplitude: 2 V
Lead Channel Setting Pacing Amplitude: 2.5 V
Lead Channel Setting Pacing Pulse Width: 0.5 ms
Lead Channel Setting Sensing Sensitivity: 0.5 mV
Pulse Gen Serial Number: 1245762

## 2021-04-13 ENCOUNTER — Ambulatory Visit (INDEPENDENT_AMBULATORY_CARE_PROVIDER_SITE_OTHER): Payer: PPO | Admitting: Endocrinology

## 2021-04-13 ENCOUNTER — Other Ambulatory Visit: Payer: Self-pay

## 2021-04-13 LAB — BASIC METABOLIC PANEL
BUN: 20 mg/dL (ref 6–23)
CO2: 29 mEq/L (ref 19–32)
Calcium: 10.5 mg/dL (ref 8.4–10.5)
Chloride: 97 mEq/L (ref 96–112)
Creatinine, Ser: 1.17 mg/dL (ref 0.40–1.50)
GFR: 56.45 mL/min — ABNORMAL LOW (ref >60.00)
Glucose, Bld: 101 mg/dL — ABNORMAL HIGH (ref 70–99)
Potassium: 4.5 mEq/L (ref 3.5–5.1)
Sodium: 132 mEq/L — ABNORMAL LOW (ref 135–145)

## 2021-04-13 NOTE — Patient Instructions (Addendum)
Blood tests are requested for you today.  We'll let you know about the results.   You can stop taking the Vitamin-D.  Please come back for a follow-up appointment in 2 months.

## 2021-04-13 NOTE — Progress Notes (Signed)
BILATERAL LAPAROSCOPIC INGUINAL HERNIA REPAIR WITH LEFT FEMORAL HERNIA REPAIR;  Surgeon: Michael Boston, MD;  Location: [place];  Service: General;  Laterality: Bilateral;  . INSERTION OF MESH Bilateral 01/17/2015   Procedure: INSERTION OF MESH;  Surgeon: Michael Boston, MD;  Location: [place];  Service: General;  Laterality: Bilateral;  . LAPAROSCOPIC CHOLECYSTECTOMY  06/05/2017  . LEAD INSERTION N/A 04/07/2017   Procedure: RA Lead Insertion;  Surgeon: Deboraha Sprang, MD;  Location: [facility];  Service: Cardiovascular;  Laterality: N/A;  . LEAD REVISION/REPAIR N/A 04/08/2017   Procedure:  Atrial Lead Revision/Repair;  Surgeon: Deboraha Sprang, MD;  Location: [facility];  Service: Cardiovascular;  Laterality: N/A;  . LEFT HEART CATH AND CORONARY ANGIOGRAPHY Left 05/14/2019   Procedure: LEFT HEART CATH AND CORONARY ANGIOGRAPHY;  Surgeon: Wellington Hampshire, MD;  Location: [facility];  Service: Cardiovascular;  Laterality: Left;  . LEFT HEART CATHETERIZATION WITH CORONARY/GRAFT ANGIOGRAM N/A 10/30/2012   Procedure: LEFT HEART CATHETERIZATION WITH Beatrix Fetters;  Surgeon: Peter M Martinique, MD;  Location: [facility] CATH [facility];  Service: Cardiovascular;  Laterality: N/A;  . LIVER BIOPSY N/A 06/05/2017   Procedure: SINGLE SITE LIVER BIOPSY ERAS PATHWAY;  Surgeon: Michael Boston, MD;  Location: [place];  Service: General;  Laterality: N/A;  ERAS PATHWAY  . LUMBAR FUSION  09/2007    Social History   Socioeconomic History  . Marital status: Married    Spouse name: Not on file  . Number of children: 5  . Years of education: Not on file  . Highest education level: Not on file  Occupational History  . Occupation: Architectural technologist: Audiological scientist  Tobacco Use  . Smoking status: Never Smoker  . Smokeless tobacco: Never Used  Vaping Use  . Vaping Use: Never used  Substance and Sexual Activity  . Alcohol use: No  . Drug use: No  . Sexual activity: Never  Other Topics Concern  . Not on file  Social History Narrative  . Not on file   Social Determinants of Health   Financial Resource Strain: Low Risk   . Difficulty of Paying Living Expenses: Not hard at all  Food Insecurity: No Food Insecurity  . Worried About Charity fundraiser in the Last Year: Never true  . Ran Out of Food in the Last Year: Never true  Transportation Needs: No Transportation Needs  . Lack of Transportation (Medical): No  . Lack of Transportation (Non-Medical): No  Physical Activity: Sufficiently Active  . Days of Exercise per Week: 6 days  . Minutes of Exercise per Session: 30 min   Stress: Stress Concern Present  . Feeling of Stress : To some extent  Social Connections: Socially Integrated  . Frequency of Communication with Friends and Family: More than three times a week  . Frequency of Social Gatherings with Friends and Family: Once a week  . Attends Religious Services: More than 4 times per year  . Active Member of Clubs or Organizations: No  . Attends Archivist Meetings: More than 4 times per year  . Marital Status: Married  Human resources officer Violence: Not At Risk  . Fear of Current or Ex-Partner: No  . Emotionally Abused: No  . Physically Abused: No  . Sexually Abused: No    Current Outpatient Medications on File Prior to Visit  Medication Sig Dispense Refill  . acetaminophen (TYLENOL) 500 MG tablet Take 500 mg by mouth at bedtime.    [name]  lb (63.5 kg)   SpO2 96%   BMI 21.93 kg/m    Review  of Systems Denies falls    Objective:   Physical Exam VITAL SIGNS:  See vs page GENERAL: no distress GAIT: normal and steady.     Lab Results  Component Value Date   PTH 47 01/24/2021   CALCIUM 10.5 04/13/2021   CAION 1.40 (H) 04/06/2013   PHOS 2.6 11/13/2012   25-OH Vit-D=43    Assessment & Plan:  hypocalcuric hypercalcemia: well-controlled.  Please continue the same cinaalcet. Vit D def: well-controlled. D/c Vit D supplement  lb (63.5 kg)   SpO2 96%   BMI 21.93 kg/m    Review  of Systems Denies falls    Objective:   Physical Exam VITAL SIGNS:  See vs page GENERAL: no distress GAIT: normal and steady.     Lab Results  Component Value Date   PTH 47 01/24/2021   CALCIUM 10.5 04/13/2021   CAION 1.40 (H) 04/06/2013   PHOS 2.6 11/13/2012   25-OH Vit-D=43    Assessment & Plan:  hypocalcuric hypercalcemia: well-controlled.  Please continue the same cinaalcet. Vit D def: well-controlled. D/c Vit D supplement  lb (63.5 kg)   SpO2 96%   BMI 21.93 kg/m    Review  of Systems Denies falls    Objective:   Physical Exam VITAL SIGNS:  See vs page GENERAL: no distress GAIT: normal and steady.     Lab Results  Component Value Date   PTH 47 01/24/2021   CALCIUM 10.5 04/13/2021   CAION 1.40 (H) 04/06/2013   PHOS 2.6 11/13/2012   25-OH Vit-D=43    Assessment & Plan:  hypocalcuric hypercalcemia: well-controlled.  Please continue the same cinaalcet. Vit D def: well-controlled. D/c Vit D supplement

## 2021-04-14 MED ORDER — CINACALCET HCL 30 MG PO TABS: 30.0000 mg | ORAL_TABLET | ORAL | 3 refills | Status: DC

## 2021-04-17 ENCOUNTER — Other Ambulatory Visit: Payer: Self-pay | Admitting: Internal Medicine

## 2021-04-26 NOTE — Progress Notes (Signed)
Remote ICD transmission.   

## 2021-05-17 ENCOUNTER — Other Ambulatory Visit: Payer: Self-pay

## 2021-05-17 ENCOUNTER — Ambulatory Visit (INDEPENDENT_AMBULATORY_CARE_PROVIDER_SITE_OTHER): Payer: PPO | Admitting: Dermatology

## 2021-05-17 DIAGNOSIS — L821 Other seborrheic keratosis: Secondary | ICD-10-CM

## 2021-05-17 DIAGNOSIS — L820 Inflamed seborrheic keratosis: Secondary | ICD-10-CM

## 2021-05-17 DIAGNOSIS — D229 Melanocytic nevi, unspecified: Secondary | ICD-10-CM

## 2021-05-17 DIAGNOSIS — Z872 Personal history of diseases of the skin and subcutaneous tissue: Secondary | ICD-10-CM

## 2021-05-17 DIAGNOSIS — Z1283 Encounter for screening for malignant neoplasm of skin: Secondary | ICD-10-CM

## 2021-05-17 DIAGNOSIS — L578 Other skin changes due to chronic exposure to nonionizing radiation: Secondary | ICD-10-CM

## 2021-05-17 DIAGNOSIS — L814 Other melanin hyperpigmentation: Secondary | ICD-10-CM | POA: Diagnosis not present

## 2021-05-17 DIAGNOSIS — D1800 Hemangioma unspecified site: Secondary | ICD-10-CM | POA: Diagnosis not present

## 2021-05-17 NOTE — Progress Notes (Signed)
   Follow-Up Visit   Subjective  [name] Christensen is a 87 y.o. male who presents for the following: Total body skin exam (Hx of AKs) and check spot (R leg ~24m, no symptoms). The patient presents for Total-Body Skin Exam (TBSE) for skin cancer screening and mole check.  The following portions of the chart were reviewed this encounter and updated as appropriate:   Tobacco  Allergies  Meds  Problems  Med Hx  Surg Hx  Fam Hx      Review of Systems:  No other skin or systemic complaints except as noted in HPI or Assessment and Plan.  Objective  Well appearing patient in no apparent distress; mood and affect are within normal limits.  A full examination was performed including scalp, head, eyes, ears, nose, lips, neck, chest, axillae, abdomen, back, buttocks, bilateral upper extremities, bilateral lower extremities, hands, feet, fingers, toes, fingernails, and toenails. All findings within normal limits unless otherwise noted below.   Assessment & Plan   Lentigines - Scattered tan macules - Due to sun exposure - Benign-appering, observe - Recommend daily broad spectrum sunscreen SPF 30+ to sun-exposed areas, reapply every 2 hours as needed. - Call for any changes  Seborrheic Keratoses - Stuck-on, waxy, tan-brown papules and/or plaques  - Benign-appearing - Discussed benign etiology and prognosis. - Observe - Call for any changes  Melanocytic Nevi - Tan-brown and/or pink-flesh-colored symmetric macules and papules - Benign appearing on exam today - Observation - Call clinic for new or changing moles - Recommend daily use of broad spectrum spf 30+ sunscreen to sun-exposed areas.   Hemangiomas - Red papules - Discussed benign nature - Observe - Call for any changes  Actinic Damage - Chronic condition, secondary to cumulative UV/sun exposure - diffuse scaly erythematous macules with underlying dyspigmentation - Recommend daily broad spectrum sunscreen SPF 30+ to  sun-exposed areas, reapply every 2 hours as needed.  - Staying in the shade or wearing long sleeves, sun glasses (UVA+UVB protection) and wide brim hats (4-inch brim around the entire circumference of the hat) are also recommended for sun protection.  - Call for new or changing lesions.  Skin cancer screening performed today.  Inflamed seborrheic keratosis R mid lat pretibial x 1, L inf lat knee x 1, Total = 2  Destruction of lesion - R mid lat pretibial x 1, L inf lat knee x 1, Total = 2 Complexity: simple   Destruction method: cryotherapy   Informed consent: discussed and consent obtained   Timeout:  patient name, date of birth, surgical site, and procedure verified Lesion destroyed using liquid nitrogen: Yes   Region frozen until ice ball extended beyond lesion: Yes   Outcome: patient tolerated procedure well with no complications   Post-procedure details: wound care instructions given    Return in about 1 year (around 05/17/2022) for TBSE, Hx of AKs.  I, Othelia Pulling, RMA, am acting as scribe for Sarina Ser, MD . Documentation: I have reviewed the above documentation for accuracy and completeness, and I agree with the above.  Sarina Ser, MD

## 2021-05-17 NOTE — Patient Instructions (Signed)

## 2021-05-24 ENCOUNTER — Encounter: Payer: Self-pay | Admitting: Dermatology

## 2021-05-25 ENCOUNTER — Telehealth: Payer: Self-pay | Admitting: Internal Medicine

## 2021-05-25 NOTE — Telephone Encounter (Signed)
  FYI  Patient calling to report his spouse and several other people from his church tested covid+  He has not been tested. No symptoms. He does plan to get tested

## 2021-05-26 NOTE — Telephone Encounter (Signed)
Noted. Thanks.

## 2021-05-27 ENCOUNTER — Ambulatory Visit (HOSPITAL_COMMUNITY)
Admission: EM | Admit: 2021-05-27 | Discharge: 2021-05-27 | Disposition: A | Discharge status: Home / Self Care | Payer: PPO | Attending: Student | Admitting: Student

## 2021-05-27 ENCOUNTER — Other Ambulatory Visit: Payer: Self-pay

## 2021-05-27 ENCOUNTER — Encounter (HOSPITAL_COMMUNITY): Payer: Self-pay | Admitting: *Deleted

## 2021-05-27 DIAGNOSIS — J069 Acute upper respiratory infection, unspecified: Secondary | ICD-10-CM

## 2021-05-27 DIAGNOSIS — Z7901 Long term (current) use of anticoagulants: Secondary | ICD-10-CM

## 2021-05-27 DIAGNOSIS — U071 COVID-19: Secondary | ICD-10-CM | POA: Diagnosis not present

## 2021-05-27 MED ORDER — PROMETHAZINE-DM 6.25-15 MG/5ML PO SYRP: 2.5000 mL | ORAL_SOLUTION | Freq: Four times a day (QID) | ORAL | 0 refills | Status: DC | PRN

## 2021-05-27 MED ORDER — BENZONATATE 100 MG PO CAPS: 100.0000 mg | ORAL_CAPSULE | Freq: Three times a day (TID) | ORAL | 0 refills | Status: DC

## 2021-05-27 NOTE — ED Triage Notes (Signed)
Pt had a Positive COVID test at home today. Pt's wife tested positive yesterday .Chills with cough.

## 2021-05-27 NOTE — ED Provider Notes (Signed)
[facility]    CSN: [id number] Arrival date & time: 05/27/21  1602      History   Chief Complaint Chief Complaint  Patient presents with   Covid Positive    HPI [name] Christensen is a 87 y.o. male presenting with viral symptoms x3 days and positive home covid test.  Medical history cardiac arrest, bilateral carotid disease, CHF, CAD, pacemaker in place, A. fib, long-term anticoagulation.  Endorses frequent nonproductive cough, worse at night.  Subjective chills, has not monitored temperature.  Taking Tylenol for relief.  Denies shortness of breath, dizziness, chest pain, weakness, headaches, vision changes.  Denies nausea/vomiting/diarrhea.  HPI  Past Medical History:  Diagnosis Date   Actinic keratosis 06/09/2019   L sup forehead at hairline - bx proven   AICD (automatic cardioverter/defibrillator) present 04/07/2017   Allergy    Anxiety    BPH (benign prostatic hypertrophy)    elevated PSA Dr. Lindell Noe Bx 2010   Cardiac arrest [facility])    a. out-of-hospital arrest 11/17/2012 - EF 40-45%, patent grafts on cath, received [employer] AICD.   Carotid disease, bilateral (HCC)    a. 0-39% by doppers.   Cataract    bil cataracts removed   CHF (congestive heart failure) (HCC)    Coronary artery disease    a. s/p MI/CABG 2005. b. s/p cath at time of VF arrest 10/2013 - grafts patent.   Cough    Diverticulosis    Elevated LFTs    a. 10/2012 felt due to cardiac arrest - Hepatitis C Ab reactive from 11/15/2012>>Hep C RNA PCR negative 10/30/2012.    GERD (gastroesophageal reflux disease)    Hyperlipidemia    Hypertension    Internal hemorrhoids    Myocardial infarction (HCC)    2005 - CABG x 5 2005   RBBB    Tubular adenoma of colon    Ventricular bigeminy    a. Event monitor 01/2013: NSR with PVCs and occ bigeminy.    Patient Active Problem List   Diagnosis Date Noted   Aortic atherosclerosis (HCC) 04/02/2021   Apathy 04/02/2021   Memory problem 03/01/2021   Chronic  renal insufficiency, stage 3 (moderate) (HCC) 01/01/2021   Persistent atrial fibrillation (HCC) 07/05/2019   Ischemic cardiomyopathy    Unstable angina (HCC)    Upper respiratory infection 09/23/2018   Hypocalciuric hypercalcemia 09/16/2018   Fall 08/17/2018   Facial pain 08/17/2018   Hematuria 05/20/2018   Paroxysmal atrial fibrillation (HCC)    Bronchiectasis (HCC) assoc with possible MAI 03/11/2018   Dyspnea on exertion 03/10/2018   Chronic diastolic CHF (congestive heart failure) (HCC) 03/09/2018   Atrial flutter (HCC) 03/09/2018   PVC's (premature ventricular contractions) 03/09/2018   Arthralgia 01/29/2018   Dry skin 12/22/2017   Carotid stenosis, asymptomatic, bilateral 12/22/2017   Chills with fever 11/24/2017   Dysuria 11/24/2017   Anxiety disorder 11/24/2017   Hepatic cirrhosis (HCC) 06/05/2017   Chronic calculous cholecystitis 06/05/2017   Weight loss 04/24/2017   Sinus node dysfunction (HCC) 04/07/2017   Chronic cholecystitis s/p lap cholecystectomy 06/05/2017 02/24/2017   Pleural effusion 02/24/2017   Dyslipidemia 08/05/2016   Fatigue 04/09/2016   Constipation 08/09/2015   Cough in adult 11/30/2014   LBP (low back pain) 08/30/2013   Chest pain, atypical 07/21/2013   Lung infiltrate 07/19/2013   Ventricular vs Supraventricular bigeminy 04/15/2013   Implantable cardioverter-defibrillator (ICD) in situ November 29, 2012   Sudden cardiac death (HCC) Nov 17, 2012   CORONARY ARTERY BYPASS GRAFT, HX OF 10/11/2008  Insomnia 12/07/2007   Essential hypertension 09/01/2007   CAD (coronary artery disease) 09/01/2007   GERD 09/01/2007   BPH (benign prostatic hyperplasia) 09/01/2007    Past Surgical History:  Procedure Laterality Date   APPENDECTOMY     CARDIAC CATHETERIZATION  2007   with patent graft anatomy atretic left internal mammary  artery to the LAD which is nonobstructive. Will restart study June 08, 2007   CARDIOVERSION N/A 03/27/2018   Procedure: CARDIOVERSION;   Surgeon: Iran Ouch, MD;  Location: [facility] ORS;  Service: Cardiovascular;  Laterality: N/A;   CARDIOVERSION N/A 01/04/2019   Procedure: CARDIOVERSION (CATH LAB);  Surgeon: Iran Ouch, MD;  Location: [facility] ORS;  Service: Cardiovascular;  Laterality: N/A;   CARDIOVERSION N/A 07/07/2019   Procedure: CARDIOVERSION;  Surgeon: Wendall Stade, MD;  Location: [facility] ENDOSCOPY;  Service: Cardiovascular;  Laterality: N/A;   CATARACT EXTRACTION W/PHACO Right 04/23/2016   Procedure: CATARACT EXTRACTION PHACO AND INTRAOCULAR LENS PLACEMENT (IOC);  Surgeon: [name], MD;  Location: [facility] ORS;  Service: Ophthalmology;  Laterality: Right;  [place] 1.09 AP% 19.3 CDE 13.38 Fluid pack lot # [id number]   CHOLECYSTECTOMY N/A 06/05/2017   Procedure: LAPAROSCOPIC CHOLECYSTECTOMY WITH INTRAOPERATIVE CHOLANGIOGRAM ERAS PATHWAY POSSIBLE NEEDLE CORE BIOPSY OF LIVER;  Surgeon: [name], MD;  Location: [facility] OR;  Service: General;  Laterality: N/A;  ERAS PATHWAY   COLONOSCOPY     CORONARY ARTERY BYPASS GRAFT  2005   EYE SURGERY     ICD GENERATOR CHANGEOUT N/A 04/07/2017   Procedure: ICD Generator Changeout;  Surgeon: Duke Salvia, MD;  Location: [facility] INVASIVE CV LAB;  Service: Cardiovascular;  Laterality: N/A;   IMPLANTABLE CARDIOVERTER DEFIBRILLATOR IMPLANT N/A 10/28/2012   STJ single chamber ICD implanted by Dr Graciela Husbands for cardiac arrest    INGUINAL HERNIA REPAIR Bilateral 01/17/2015   Procedure: BILATERAL LAPAROSCOPIC INGUINAL HERNIA REPAIR WITH LEFT FEMORAL HERNIA REPAIR;  Surgeon: [name], MD;  Location: [facility] OR;  Service: General;  Laterality: Bilateral;   INSERTION OF MESH Bilateral 01/17/2015   Procedure: INSERTION OF MESH;  Surgeon: [name], MD;  Location: [facility] OR;  Service: General;  Laterality: Bilateral;   LAPAROSCOPIC CHOLECYSTECTOMY  06/05/2017   LEAD INSERTION N/A 04/07/2017   Procedure: RA Lead Insertion;  Surgeon: Duke Salvia, MD;  Location: [facility] INVASIVE CV LAB;  Service: Cardiovascular;   Laterality: N/A;   LEAD REVISION/REPAIR N/A 04/08/2017   Procedure: Atrial Lead Revision/Repair;  Surgeon: Duke Salvia, MD;  Location: [facility] INVASIVE CV LAB;  Service: Cardiovascular;  Laterality: N/A;   LEFT HEART CATH AND CORONARY ANGIOGRAPHY Left 05/14/2019   Procedure: LEFT HEART CATH AND CORONARY ANGIOGRAPHY;  Surgeon: Iran Ouch, MD;  Location: [facility] INVASIVE CV LAB;  Service: Cardiovascular;  Laterality: Left;   LEFT HEART CATHETERIZATION WITH CORONARY/GRAFT ANGIOGRAM N/A 11/24/2012   Procedure: LEFT HEART CATHETERIZATION WITH Isabel Caprice;  Surgeon: [name], MD;  Location: [facility] CATH LAB;  Service: Cardiovascular;  Laterality: N/A;   LIVER BIOPSY N/A 06/05/2017   Procedure: SINGLE SITE LIVER BIOPSY ERAS PATHWAY;  Surgeon: [name], MD;  Location: [facility] OR;  Service: General;  Laterality: N/A;  ERAS PATHWAY   LUMBAR FUSION  09/2007       Home Medications    Prior to Admission medications   Medication Sig Start Date End Date Taking? Authorizing Provider  benzonatate (TESSALON) 100 MG capsule Take 1 capsule (100 mg total) by mouth every 8 (eight) hours. 05/27/21  Yes Rhys Martini, PA-C  promethazine-dextromethorphan (PROMETHAZINE-DM)  6.25-15 MG/5ML syrup Take 2.5 mLs by mouth 4 (four) times daily as needed for cough. 05/27/21  Yes Rhys Martini, PA-C  acetaminophen (TYLENOL) 500 MG tablet Take 500 mg by mouth at bedtime.    [provider]  alfuzosin (UROXATRAL) 10 MG 24 hr tablet Take 10 mg by mouth every evening.     [provider]  atorvastatin (LIPITOR) 20 MG tablet TAKE 1 TABLET BY MOUTH ONCE DAILY 09/25/20   Plotnikov, Georgina Quint, MD  betamethasone dipropionate 0.05 % cream Apply topically 2 (two) times daily. 08/30/20   Plotnikov, Georgina Quint, MD  cinacalcet (SENSIPAR) 30 MG tablet Take 1 tablet (30 mg total) by mouth 3 (three) times a week. 04/16/21   Romero Belling, MD  dofetilide [facility]) 250 MCG capsule TAKE ONE CAPSULE BY MOUTH TWICE A  DAY 09/08/20   Duke Salvia, MD  dutasteride (AVODART) 0.5 MG capsule Take 0.5 mg by mouth every evening.     [provider]  ELIQUIS 5 MG TABS tablet TAKE 1 TABLET BY MOUTH TWICE (2) DAILY 09/08/20   Plotnikov, Georgina Quint, MD  ENTRESTO 24-26 MG TAKE 1 TABLET BY MOUTH TWICE A DAY 03/20/21   Duke Salvia, MD  escitalopram (LEXAPRO) 5 MG tablet Take 1 tablet (5 mg total) by mouth daily. 03/01/21   Plotnikov, Georgina Quint, MD  esomeprazole (NEXIUM) 20 MG capsule Take 1 capsule (20 mg total) by mouth daily at 12 noon. 06/29/19   Plotnikov, Georgina Quint, MD  linaclotide (LINZESS) 290 MCG CAPS capsule Take 1 capsule (290 mcg total) by mouth daily as needed (constipation). 01/01/21   Plotnikov, Georgina Quint, MD  losartan (COZAAR) 50 MG tablet TAKE 1 TABLET BY MOUTH ONCE A DAY 04/17/21   Plotnikov, Georgina Quint, MD  magnesium oxide (MAG-OX) 400 (241.3 Mg) MG tablet TAKE 1 TABLET BY MOUTH ONCE DAILY 06/29/20   Fenton, Clint R, PA  metoprolol succinate (TOPROL-XL) 50 MG 24 hr tablet TAKE 1 TABLET BY MOUTH TWICE A DAY. TAKEWITH OR IMMEDIATELY FOLLOWING A MEAL 03/01/21   Plotnikov, Georgina Quint, MD  Multiple Vitamin (MULTIVITAMIN WITH MINERALS) TABS tablet Take 1 tablet by mouth every other day.    [provider]  nitroGLYCERIN (NITROSTAT) 0.4 MG SL tablet As directed SL 03/14/20   Plotnikov, Georgina Quint, MD  Polyethyl Glycol-Propyl Glycol (SYSTANE OP) Place 1 drop into both eyes daily as needed (burning eyes).     [provider]  triamcinolone cream (KENALOG) 0.1 % APPLY 1 APPLICATION TOPICALLY TWO TIMES DAILY AS NEEDED AVOIDING FACE, GROIN ANDARMPITS 08/28/20   Deirdre Evener, MD  vitamin B-12 (CYANOCOBALAMIN) 1000 MCG tablet Take 1,000 mcg by mouth daily.    [provider]    Family History Family History  Problem Relation Age of Onset   Coronary artery disease Mother    Heart attack Brother    Stomach cancer Neg Hx    Colon cancer Neg Hx    Esophageal cancer Neg Hx    Pancreatic  cancer Neg Hx    Prostate cancer Neg Hx    Rectal cancer Neg Hx    Hypercalcemia Neg Hx     Social History Social History   Tobacco Use   Smoking status: Never   Smokeless tobacco: Never  Vaping Use   Vaping Use: Never used  Substance Use Topics   Alcohol use: No   Drug use: No     Allergies   Ezetimibe, Penicillins, Pravastatin sodium, Rosuvastatin, and Niacin  Review of Systems Review of Systems  Constitutional:  Negative for appetite change, chills and fever.  HENT:  Positive for congestion. Negative for ear pain, rhinorrhea, sinus pressure, sinus pain and sore throat.   Eyes:  Negative for redness and visual disturbance.  Respiratory:  Positive for cough. Negative for chest tightness, shortness of breath and wheezing.   Cardiovascular:  Negative for chest pain and palpitations.  Gastrointestinal:  Negative for abdominal pain, constipation, diarrhea, nausea and vomiting.  Genitourinary:  Negative for dysuria, frequency and urgency.  Musculoskeletal:  Positive for myalgias.  Neurological:  Negative for dizziness, weakness and headaches.  Psychiatric/Behavioral:  Negative for confusion.   All other systems reviewed and are negative.   Physical Exam Triage Vital Signs ED Triage Vitals  Enc Vitals Group     BP 05/27/21 1612 (!) 119/56     Pulse Rate 05/27/21 1612 70     Resp 05/27/21 1612 18     Temp 05/27/21 1612 98.5 F (36.9 C)     Temp src --      SpO2 05/27/21 1612 97 %     Weight --      Height --      Head Circumference --      Peak Flow --      Pain Score 05/27/21 1610 5     Pain Loc --      Pain Edu? --      Excl. in GC? --    No data found.  Updated Vital Signs BP (!) 119/56   Pulse 70   Temp 98.5 F (36.9 C)   Resp 18   SpO2 97%   Visual Acuity Right Eye Distance:   Left Eye Distance:   Bilateral Distance:    Right Eye Near:   Left Eye Near:    Bilateral Near:     Physical Exam Vitals reviewed.  Constitutional:      General:  He is not in acute distress.    Appearance: Normal appearance. He is not ill-appearing.  HENT:     Head: Normocephalic and atraumatic.     Right Ear: Hearing, tympanic membrane, ear canal and external ear normal. No swelling or tenderness. There is no impacted cerumen. No mastoid tenderness. Tympanic membrane is not perforated, erythematous, retracted or bulging.     Left Ear: Hearing, tympanic membrane, ear canal and external ear normal. No swelling or tenderness. There is no impacted cerumen. No mastoid tenderness. Tympanic membrane is not perforated, erythematous, retracted or bulging.     Nose:     Right Sinus: No maxillary sinus tenderness or frontal sinus tenderness.     Left Sinus: No maxillary sinus tenderness or frontal sinus tenderness.     Mouth/Throat:     Mouth: Mucous membranes are moist.     Pharynx: Uvula midline. No oropharyngeal exudate or posterior oropharyngeal erythema.     Tonsils: No tonsillar exudate.  Cardiovascular:     Rate and Rhythm: Normal rate and regular rhythm.     Heart sounds: Normal heart sounds.  Pulmonary:     Breath sounds: Normal breath sounds and air entry. No wheezing, rhonchi or rales.  Chest:     Chest wall: No tenderness.  Abdominal:     General: Abdomen is flat. Bowel sounds are normal.     Tenderness: There is no abdominal tenderness. There is no guarding or rebound.  Lymphadenopathy:     Cervical: No cervical adenopathy.  Neurological:     General: No focal  deficit present.     Mental Status: He is alert and oriented to person, place, and time.  Psychiatric:        Attention and Perception: Attention and perception normal.        Mood and Affect: Mood and affect normal.        Behavior: Behavior normal. Behavior is cooperative.        Thought Content: Thought content normal.        Judgment: Judgment normal.     UC Treatments / Results  Labs (all labs ordered are listed, but only abnormal results are displayed) Labs Reviewed - No  data to display  EKG   Radiology No results found.  Procedures Procedures (including critical care time)  Medications Ordered in UC Medications - No data to display  Initial Impression / Assessment and Plan / UC Course  I have reviewed the triage vital signs and the nursing notes.  Pertinent labs & imaging results that were available during my care of the patient were reviewed by me and considered in my medical decision making (see chart for details).     This patient is a very pleasant 87 y.o. year old male presenting with Covid-19. Today this pt is afebrile nontachycardic nontachypneic, oxygenating well on room air, no wheezes rhonchi or rales. Last dose of antipyretic was 4 hours ago. Denies history pulmonary disease, but this patient does take long-term anticoagulation for afib. Reassuring exam today. He is double vaccinated and double boosted for COVID19.  Discussed that patient has multiple daily medications that interact with Paxlovid, and that I am not comfortable prescribing this medication due to this.  I am particularly concerned about the Eliquis, and increased bleeding risk. Patient and son verbalize understanding and agreement and state they are in agreement with treatment plan. Will instead treat symptomatically as below.  F/u with PCP at their earliest convenience for recheck, ideally 2-3 days. STRICT ED return precautions discussed. Patient and son verbalizes understanding and agreement.      Final Clinical Impressions(s) / UC Diagnoses   Final diagnoses:  COVID-19  Current use of long term anticoagulation  Viral URI with cough     Discharge Instructions      -Tessalon (Benzonatate) as needed for cough. Take one pill up to 3x daily (every 8 hours) -Promethazine DM cough syrup for congestion/cough. This could make you drowsy, so take at night before bed. -For fevers/chills, bodyaches, headaches- tylenol -Drink plenty of fluids and eat a bland diet as  tolerated -Head to ED if new/worsening shortness of breath, weakness, dizziness, chest pain -Follow-up with PCP at their earliest convenience for recheck     ED Prescriptions     Medication Sig Dispense Auth. Provider   promethazine-dextromethorphan (PROMETHAZINE-DM) 6.25-15 MG/5ML syrup Take 2.5 mLs by mouth 4 (four) times daily as needed for cough. 62 mL Rhys Martini, PA-C   benzonatate (TESSALON) 100 MG capsule Take 1 capsule (100 mg total) by mouth every 8 (eight) hours. 21 capsule Rhys Martini, PA-C      PDMP not reviewed this encounter.   Rhys Martini, PA-C 05/27/21 1727

## 2021-05-27 NOTE — Discharge Instructions (Addendum)
-  Tessalon (Benzonatate) as needed for cough. Take one pill up to 3x daily (every 8 hours) -Promethazine DM cough syrup for congestion/cough. This could make you drowsy, so take at night before bed. -For fevers/chills, bodyaches, headaches- tylenol -Drink plenty of fluids and eat a bland diet as tolerated -Head to ED if new/worsening shortness of breath, weakness, dizziness, chest pain -Follow-up with PCP at their earliest convenience for recheck

## 2021-05-29 ENCOUNTER — Telehealth: Payer: Self-pay | Admitting: Internal Medicine

## 2021-05-29 NOTE — Telephone Encounter (Signed)
[facility] FYI 7.3.22:  ---Caller states that his symptoms started on friday, and he tested positive for covid today. he feels fevered, cough, body aches, chills, and headaches. denies other symptoms  Advised to see PCP within 4 hours, please advise.

## 2021-05-30 ENCOUNTER — Other Ambulatory Visit: Payer: Self-pay | Admitting: Internal Medicine

## 2021-06-06 ENCOUNTER — Ambulatory Visit (INDEPENDENT_AMBULATORY_CARE_PROVIDER_SITE_OTHER): Payer: PPO | Admitting: Internal Medicine

## 2021-06-06 ENCOUNTER — Encounter: Payer: Self-pay | Admitting: Internal Medicine

## 2021-06-06 ENCOUNTER — Other Ambulatory Visit: Payer: Self-pay

## 2021-06-06 DIAGNOSIS — J189 Pneumonia, unspecified organism: Secondary | ICD-10-CM

## 2021-06-06 DIAGNOSIS — U071 COVID-19: Secondary | ICD-10-CM | POA: Diagnosis not present

## 2021-06-06 DIAGNOSIS — I952 Hypotension due to drugs: Secondary | ICD-10-CM

## 2021-06-06 MED ORDER — METHYLPREDNISOLONE 4 MG PO TBPK: ORAL_TABLET | ORAL | 0 refills | Status: DC

## 2021-06-06 MED ORDER — PROMETHAZINE-CODEINE 6.25-10 MG/5ML PO SYRP: 5.0000 mL | ORAL_SOLUTION | ORAL | 0 refills | Status: DC | PRN

## 2021-06-06 MED ORDER — CEFDINIR 300 MG PO CAPS: 300.0000 mg | ORAL_CAPSULE | Freq: Two times a day (BID) | ORAL | 0 refills | Status: DC

## 2021-06-06 NOTE — Progress Notes (Signed)
Multimedia which are MERGED.dysmotility mucocele, or elevated Orandi affect mildly cushingoid, cough, cardiology?  We will talk with him effusion,  Subjective:  Patient ID: [name] Christensen, male    DOB: 1932-05-21  Age: 87 y.o. MRN: [id number]  CC: Hypotension (Pt states since he had covid his BP has been running low, he has no energy)   HPI [name] presents for COVID Jul 3d (+) test  C/o cough - yellow phlegm  Outpatient Medications Prior to Visit  Medication Sig Dispense Refill   acetaminophen (TYLENOL) 500 MG tablet Take 500 mg by mouth at bedtime.     alfuzosin (UROXATRAL) 10 MG 24 hr tablet Take 10 mg by mouth every evening.      atorvastatin (LIPITOR) 20 MG tablet TAKE 1 TABLET BY MOUTH ONCE DAILY 90 tablet 3   benzonatate (TESSALON) 100 MG capsule Take 1 capsule (100 mg total) by mouth every 8 (eight) hours. 21 capsule 0   betamethasone dipropionate 0.05 % cream Apply topically 2 (two) times daily. 30 g 1   cinacalcet (SENSIPAR) 30 MG tablet Take 1 tablet (30 mg total) by mouth 3 (three) times a week. 40 tablet 3   dofetilide (TIKOSYN) 250 MCG capsule TAKE ONE CAPSULE BY MOUTH TWICE A DAY 180 capsule 3   dutasteride (AVODART) 0.5 MG capsule Take 0.5 mg by mouth every evening.      ELIQUIS 5 MG TABS tablet TAKE 1 TABLET BY MOUTH TWICE (2) DAILY 180 tablet 3   ENTRESTO 24-26 MG TAKE 1 TABLET BY MOUTH TWICE A DAY 180 tablet 3   escitalopram (LEXAPRO) 5 MG tablet Take 1 tablet (5 mg total) by mouth daily. 30 tablet 5   esomeprazole (NEXIUM) 20 MG capsule Take 1 capsule (20 mg total) by mouth daily at 12 noon. 30 capsule 3   linaclotide (LINZESS) 290 MCG CAPS capsule Take 1 capsule (290 mcg total) by mouth daily as needed (constipation). 90 capsule 3   losartan (COZAAR) 50 MG tablet TAKE 1 TABLET BY MOUTH ONCE A DAY 90 tablet 3   magnesium oxide (MAG-OX) 400 (241.3 Mg) MG tablet TAKE 1 TABLET BY MOUTH ONCE DAILY 30 tablet 6   metoprolol succinate (TOPROL-XL) 50 MG 24 hr tablet  TAKE 1 TABLET BY MOUTH TWICE A DAY. TAKEWITH OR IMMEDIATELY FOLLOWING A MEAL 180 tablet 3   Multiple Vitamin (MULTIVITAMIN WITH MINERALS) TABS tablet Take 1 tablet by mouth every other day.     nitroGLYCERIN (NITROSTAT) 0.4 MG SL tablet DISSOLVE 1 TABLET UNDER TONGUE AS NEEDEDFOR CHEST PAIN. MAY REPEAT 5 MINUTES APART 3 TIMES IF NEEDED 25 tablet 3   Polyethyl Glycol-Propyl Glycol (SYSTANE OP) Place 1 drop into both eyes daily as needed (burning eyes).      promethazine-dextromethorphan (PROMETHAZINE-DM) 6.25-15 MG/5ML syrup Take 2.5 mLs by mouth 4 (four) times daily as needed for cough. 62 mL 0   triamcinolone cream (KENALOG) 0.1 % APPLY 1 APPLICATION TOPICALLY TWO TIMES DAILY AS NEEDED AVOIDING FACE, GROIN ANDARMPITS 80 g 0   vitamin B-12 (CYANOCOBALAMIN) 1000 MCG tablet Take 1,000 mcg by mouth daily.     No facility-administered medications prior to visit.    ROS: Review of Systems  Constitutional:  Negative for appetite change, fatigue and unexpected weight change.  HENT:  Negative for congestion, nosebleeds, sneezing, sore throat and trouble swallowing.   Eyes:  Negative for itching and visual disturbance.  Respiratory:  Positive for cough.   Cardiovascular:  Negative for chest pain, palpitations and leg swelling.  Gastrointestinal:  Negative for abdominal distention, blood in stool, diarrhea and nausea.  Genitourinary:  Negative for frequency and hematuria.  Musculoskeletal:  Negative for back pain, gait problem, joint swelling and neck pain.  Skin:  Negative for rash.  Neurological:  Positive for weakness. Negative for dizziness, tremors and speech difficulty.  Psychiatric/Behavioral:  Positive for decreased concentration. Negative for agitation, dysphoric mood and sleep disturbance. The patient is not nervous/anxious.    Objective:  BP 94/60 (BP Location: Left Arm)   Pulse 100   Temp 98 F (36.7 C) (Oral)   Ht 5\' 7"  (1.702 m)   Wt 132 lb (59.9 kg)   SpO2 94%   BMI 20.67 kg/m    BP Readings from Last 3 Encounters:  06/06/21 94/60  05/27/21 (!) 119/56  04/13/21 (!) 106/50    Wt Readings from Last 3 Encounters:  06/06/21 132 lb (59.9 kg)  04/13/21 140 lb (63.5 kg)  04/02/21 142 lb 3.2 oz (64.5 kg)    Physical Exam Constitutional:      General: He is not in acute distress.    Appearance: Normal appearance. He is well-developed.     Comments: NAD  Eyes:     Conjunctiva/sclera: Conjunctivae normal.     Pupils: Pupils are equal, round, and reactive to light.  Neck:     Thyroid: No thyromegaly.     Vascular: No JVD.  Cardiovascular:     Rate and Rhythm: Normal rate and regular rhythm.     Heart sounds: Normal heart sounds. No murmur heard.   No friction rub. No gallop.  Pulmonary:     Effort: Pulmonary effort is normal. No respiratory distress.     Breath sounds: Rhonchi present. No wheezing or rales.  Chest:     Chest wall: No tenderness.  Abdominal:     General: Bowel sounds are normal. There is no distension.     Palpations: Abdomen is soft. There is no mass.     Tenderness: There is no abdominal tenderness. There is no guarding or rebound.  Musculoskeletal:        General: No tenderness. Normal range of motion.     Cervical back: Normal range of motion.  Lymphadenopathy:     Cervical: No cervical adenopathy.  Skin:    General: Skin is warm and dry.     Findings: No rash.  Neurological:     Mental Status: He is alert and oriented to person, place, and time.     Cranial Nerves: No cranial nerve deficit.     Motor: No abnormal muscle tone.     Coordination: Coordination normal.     Gait: Gait normal.     Deep Tendon Reflexes: Reflexes are normal and symmetric.  Psychiatric:        Behavior: Behavior normal.        Thought Content: Thought content normal.        Judgment: Judgment normal.   B rhonchi  Lab Results  Component Value Date   WBC 6.4 01/01/2021   HGB 12.8 (L) 01/01/2021   HCT 38.0 (L) 01/01/2021   PLT 121.0 (L)  01/01/2021   GLUCOSE 101 (H) 04/13/2021   CHOL 107 12/30/2019   TRIG 102.0 12/30/2019   HDL 46.80 12/30/2019   LDLCALC 39 12/30/2019   ALT 12 01/19/2021   AST 24 01/19/2021   NA 132 (L) 04/13/2021   K 4.5 04/13/2021   CL 97 04/13/2021   CREATININE 1.17 04/13/2021   BUN 20 04/13/2021  CO2 29 04/13/2021   TSH 2.51 12/30/2019   PSA 12.45 (H) 02/06/2016   INR 1.1 04/02/2017   HGBA1C 6.1 12/30/2019    No results found.  Assessment & Plan:     Walker Kehr, MD

## 2021-06-10 DIAGNOSIS — I959 Hypotension, unspecified: Secondary | ICD-10-CM | POA: Insufficient documentation

## 2021-06-10 DIAGNOSIS — U071 COVID-19: Secondary | ICD-10-CM | POA: Insufficient documentation

## 2021-06-10 NOTE — Assessment & Plan Note (Signed)
Blood pressure meds are on hold.  Medrol Dosepak prescribed for bronchospasm should help with the low blood pressure.  Hydrate well

## 2021-06-10 NOTE — Assessment & Plan Note (Signed)
likely a COVID complication.  Prescribed cefdinir antibiotic, cough syrup with codeine, Medrol Dosepak

## 2021-06-10 NOTE — Assessment & Plan Note (Signed)
COVID symptoms are better.  Respiratory symptoms are worse.  Likely a bacterial pneumonia complicating COVID recovery.  Prescribed Omnicef p.o.

## 2021-06-14 ENCOUNTER — Telehealth: Payer: Self-pay

## 2021-06-14 NOTE — Telephone Encounter (Signed)
Pts wife has stated pt is currently having sxs of D/N, Fever of 101.0 F, Body aches, headache and chills. Pt states he is wanting something to please help with the nausea and diarrhea. Pt wife states pt is taking Tylenol for the fever and body aches.  **Sent to PCP/MA

## 2021-06-15 NOTE — Telephone Encounter (Signed)
pts son called stating the pt has tested NEG for COVID. Pt son states he will wait on Ursa call back for further instructions.  **Sent to [employer].

## 2021-06-15 NOTE — Telephone Encounter (Signed)
Pt reassured that he did not need to do anything additional.  Pt states he still has symptoms from bronchitis since PCP prescribed antibiotics but that he is "feeling much better"; wanted to be sure he didn't need to come in to have PCP "listen to his chest".  Upon investigation of last OV on 06/06/21, pt was advised to return to office on 07/04/21; pt has f/u appt on 07/03/21.  Pt advised that if he needs an earlier appt to let us know, but to keep scheduled appt with PCP.  Pt verb understanding.

## 2021-06-15 NOTE — Telephone Encounter (Signed)
Pt states symptoms started yesterday, but feels much better & does not need anything at this time. Pt states he feels fatigued, but no other symptoms.  Pt advised to take home covid test just to be sure.  Denies further ques/concerns at this time.

## 2021-06-16 NOTE — Telephone Encounter (Signed)
Noted. Agree. Thank you.

## 2021-06-22 ENCOUNTER — Ambulatory Visit: Payer: PPO | Admitting: Endocrinology

## 2021-07-03 ENCOUNTER — Encounter: Payer: Self-pay | Admitting: Internal Medicine

## 2021-07-03 ENCOUNTER — Other Ambulatory Visit: Payer: Self-pay

## 2021-07-03 ENCOUNTER — Ambulatory Visit (INDEPENDENT_AMBULATORY_CARE_PROVIDER_SITE_OTHER): Payer: PPO | Admitting: Internal Medicine

## 2021-07-03 DIAGNOSIS — R453 Demoralization and apathy: Secondary | ICD-10-CM

## 2021-07-03 DIAGNOSIS — R634 Abnormal weight loss: Secondary | ICD-10-CM | POA: Diagnosis not present

## 2021-07-03 DIAGNOSIS — J189 Pneumonia, unspecified organism: Secondary | ICD-10-CM

## 2021-07-03 DIAGNOSIS — R197 Diarrhea, unspecified: Secondary | ICD-10-CM | POA: Insufficient documentation

## 2021-07-03 DIAGNOSIS — R413 Other amnesia: Secondary | ICD-10-CM | POA: Diagnosis not present

## 2021-07-03 DIAGNOSIS — U071 COVID-19: Secondary | ICD-10-CM

## 2021-07-03 NOTE — Assessment & Plan Note (Addendum)
Treat diarrhea Stool for C diff  Peptobismol or Immodium prn.  Use Ensure or boost if tolerated Wt Readings from Last 3 Encounters:  07/03/21 131 lb 12.8 oz (59.8 kg)  06/06/21 132 lb (59.9 kg)  04/13/21 140 lb (63.5 kg)

## 2021-07-03 NOTE — Assessment & Plan Note (Signed)
Stool for C diff  Peptobismol or Immodium prn

## 2021-07-03 NOTE — Progress Notes (Signed)
Subjective:  Patient ID: [name] Christensen, male    DOB: 1932-03-27  Age: 87 y.o. MRN: [id number]  CC: Follow-up (4 week f/u)   HPI [name] presents for post-COVID fatigue.  It is better.  He is here with his wife [name] F/u diarrhea x 10 days 2-3 times in am F/u CHF, CAD, memory problems Low BP off BP meds  Outpatient Medications Prior to Visit  Medication Sig Dispense Refill   acetaminophen (TYLENOL) 500 MG tablet Take 500 mg by mouth at bedtime.     alfuzosin (UROXATRAL) 10 MG 24 hr tablet Take 10 mg by mouth every evening.      atorvastatin (LIPITOR) 20 MG tablet TAKE 1 TABLET BY MOUTH ONCE DAILY 90 tablet 3   benzonatate (TESSALON) 100 MG capsule Take 1 capsule (100 mg total) by mouth every 8 (eight) hours. 21 capsule 0   betamethasone dipropionate 0.05 % cream Apply topically 2 (two) times daily. 30 g 1   cefdinir (OMNICEF) 300 MG capsule Take 1 capsule (300 mg total) by mouth 2 (two) times daily. 20 capsule 0   cinacalcet (SENSIPAR) 30 MG tablet Take 1 tablet (30 mg total) by mouth 3 (three) times a week. 40 tablet 3   dofetilide (TIKOSYN) 250 MCG capsule TAKE ONE CAPSULE BY MOUTH TWICE A DAY 180 capsule 3   dutasteride (AVODART) 0.5 MG capsule Take 0.5 mg by mouth every evening.      ELIQUIS 5 MG TABS tablet TAKE 1 TABLET BY MOUTH TWICE (2) DAILY 180 tablet 3   ENTRESTO 24-26 MG TAKE 1 TABLET BY MOUTH TWICE A DAY 180 tablet 3   escitalopram (LEXAPRO) 5 MG tablet Take 1 tablet (5 mg total) by mouth daily. 30 tablet 5   esomeprazole (NEXIUM) 20 MG capsule Take 1 capsule (20 mg total) by mouth daily at 12 noon. 30 capsule 3   linaclotide (LINZESS) 290 MCG CAPS capsule Take 1 capsule (290 mcg total) by mouth daily as needed (constipation). 90 capsule 3   losartan (COZAAR) 50 MG tablet TAKE 1 TABLET BY MOUTH ONCE A DAY 90 tablet 3   magnesium oxide (MAG-OX) 400 (241.3 Mg) MG tablet TAKE 1 TABLET BY MOUTH ONCE DAILY 30 tablet 6   methylPREDNISolone (MEDROL DOSEPAK) 4 MG TBPK  tablet As directed 21 tablet 0   metoprolol succinate (TOPROL-XL) 50 MG 24 hr tablet TAKE 1 TABLET BY MOUTH TWICE A DAY. TAKEWITH OR IMMEDIATELY FOLLOWING A MEAL 180 tablet 3   Multiple Vitamin (MULTIVITAMIN WITH MINERALS) TABS tablet Take 1 tablet by mouth every other day.     nitroGLYCERIN (NITROSTAT) 0.4 MG SL tablet DISSOLVE 1 TABLET UNDER TONGUE AS NEEDEDFOR CHEST PAIN. MAY REPEAT 5 MINUTES APART 3 TIMES IF NEEDED 25 tablet 3   Polyethyl Glycol-Propyl Glycol (SYSTANE OP) Place 1 drop into both eyes daily as needed (burning eyes).      promethazine-codeine (PHENERGAN WITH CODEINE) 6.25-10 MG/5ML syrup Take 5 mLs by mouth every 4 (four) hours as needed. 300 mL 0   triamcinolone cream (KENALOG) 0.1 % APPLY 1 APPLICATION TOPICALLY TWO TIMES DAILY AS NEEDED AVOIDING FACE, GROIN ANDARMPITS 80 g 0   vitamin B-12 (CYANOCOBALAMIN) 1000 MCG tablet Take 1,000 mcg by mouth daily.     No facility-administered medications prior to visit.    ROS: Review of Systems  Constitutional:  Positive for fatigue and unexpected weight change. Negative for appetite change.  HENT:  Negative for congestion, nosebleeds, sneezing, sore throat and trouble swallowing.  Eyes:  Negative for itching and visual disturbance.  Respiratory:  Negative for cough.   Cardiovascular:  Negative for chest pain, palpitations and leg swelling.  Gastrointestinal:  Negative for abdominal distention, blood in stool, diarrhea and nausea.  Genitourinary:  Negative for frequency and hematuria.  Musculoskeletal:  Negative for back pain, gait problem, joint swelling and neck pain.  Skin:  Negative for rash.  Neurological:  Positive for weakness. Negative for dizziness, tremors and speech difficulty.  Psychiatric/Behavioral:  Positive for decreased concentration. Negative for agitation, confusion, dysphoric mood and sleep disturbance. The patient is not nervous/anxious.    Objective:  There were no vitals taken for this visit.  BP  Readings from Last 3 Encounters:  06/06/21 94/60  05/27/21 (!) 119/56  04/13/21 (!) 106/50    Wt Readings from Last 3 Encounters:  06/06/21 132 lb (59.9 kg)  04/13/21 140 lb (63.5 kg)  04/02/21 142 lb 3.2 oz (64.5 kg)    Physical Exam Constitutional:      General: He is not in acute distress.    Appearance: Normal appearance. He is well-developed.     Comments: NAD  Eyes:     Conjunctiva/sclera: Conjunctivae normal.     Pupils: Pupils are equal, round, and reactive to light.  Neck:     Thyroid: No thyromegaly.     Vascular: No JVD.  Cardiovascular:     Rate and Rhythm: Normal rate and regular rhythm.     Heart sounds: Normal heart sounds. No murmur heard.   No friction rub. No gallop.  Pulmonary:     Effort: Pulmonary effort is normal. No respiratory distress.     Breath sounds: Normal breath sounds. No wheezing or rales.  Chest:     Chest wall: No tenderness.  Abdominal:     General: Bowel sounds are normal. There is no distension.     Palpations: Abdomen is soft. There is no mass.     Tenderness: There is no abdominal tenderness. There is no guarding or rebound.  Musculoskeletal:        General: No tenderness. Normal range of motion.     Cervical back: Normal range of motion.  Lymphadenopathy:     Cervical: No cervical adenopathy.  Skin:    General: Skin is warm and dry.     Findings: No rash.  Neurological:     Mental Status: He is alert and oriented to person, place, and time.     Cranial Nerves: No cranial nerve deficit.     Motor: No abnormal muscle tone.     Coordination: Coordination normal.     Gait: Gait normal.     Deep Tendon Reflexes: Reflexes are normal and symmetric.  Psychiatric:        Behavior: Behavior normal.        Thought Content: Thought content normal.        Judgment: Judgment normal.  thin  Lab Results  Component Value Date   WBC 6.4 01/01/2021   HGB 12.8 (L) 01/01/2021   HCT 38.0 (L) 01/01/2021   PLT 121.0 (L) 01/01/2021    GLUCOSE 101 (H) 04/13/2021   CHOL 107 12/30/2019   TRIG 102.0 12/30/2019   HDL 46.80 12/30/2019   LDLCALC 39 12/30/2019   ALT 12 01/19/2021   AST 24 01/19/2021   NA 132 (L) 04/13/2021   K 4.5 04/13/2021   CL 97 04/13/2021   CREATININE 1.17 04/13/2021   BUN 20 04/13/2021   CO2 29 04/13/2021   TSH  2.51 12/30/2019   PSA 12.45 (H) 02/06/2016   INR 1.1 04/02/2017   HGBA1C 6.1 12/30/2019    No results found.  Assessment & Plan:     Walker Kehr, MD

## 2021-07-03 NOTE — Assessment & Plan Note (Addendum)
Recovering. Still tired.  He is okay to resume his previous activities.

## 2021-07-03 NOTE — Assessment & Plan Note (Addendum)
Recovering. Still tired.  Rest more, take naps.  Improve nutrition with Ensure or boost supplements

## 2021-07-04 ENCOUNTER — Ambulatory Visit (INDEPENDENT_AMBULATORY_CARE_PROVIDER_SITE_OTHER): Payer: PPO

## 2021-07-04 DIAGNOSIS — I255 Ischemic cardiomyopathy: Secondary | ICD-10-CM | POA: Diagnosis not present

## 2021-07-04 LAB — CUP PACEART REMOTE DEVICE CHECK
Battery Remaining Longevity: 41 mo
Battery Remaining Percentage: 48 %
Battery Voltage: 2.93 V
Brady Statistic AP VP Percent: 3.5 %
Brady Statistic AP VS Percent: 77 %
Brady Statistic AS VP Percent: 1.8 %
Brady Statistic AS VS Percent: 17 %
Brady Statistic RA Percent Paced: 76 %
Brady Statistic RV Percent Paced: 5.3 %
Date Time Interrogation Session: 20220810020017
HighPow Impedance: 70 Ohm
HighPow Impedance: 70 Ohm
Implantable Lead Implant Date: 20131218
Implantable Lead Implant Date: 20180514
Implantable Lead Location: 753859
Implantable Lead Location: 753860
Implantable Lead Model: 5076
Implantable Pulse Generator Implant Date: 20180514
Lead Channel Impedance Value: 390 Ohm
Lead Channel Impedance Value: 430 Ohm
Lead Channel Pacing Threshold Amplitude: 0.5 V
Lead Channel Pacing Threshold Amplitude: 1.5 V
Lead Channel Pacing Threshold Pulse Width: 0.5 ms
Lead Channel Pacing Threshold Pulse Width: 0.5 ms
Lead Channel Sensing Intrinsic Amplitude: 2.1 mV
Lead Channel Sensing Intrinsic Amplitude: 4.7 mV
Lead Channel Setting Pacing Amplitude: 2 V
Lead Channel Setting Pacing Amplitude: 2.5 V
Lead Channel Setting Pacing Pulse Width: 0.5 ms
Lead Channel Setting Sensing Sensitivity: 0.5 mV
Pulse Gen Serial Number: 1245762

## 2021-07-08 NOTE — Assessment & Plan Note (Signed)
I think he is getting better.  Continue to treat anxiety/insomnia.  He is dealing better with his retirement.  COVID-19 produced a setback in his general wellbeing.  He is making a good recovery however.

## 2021-07-08 NOTE — Assessment & Plan Note (Signed)
I think he is getting better.  Continue to treat anxiety/insomnia with Lexapro.  He is dealing better with his retirement.  COVID-19 produced a setback in his general wellbeing.  He is making a good recovery however.

## 2021-07-20 ENCOUNTER — Telehealth: Payer: Self-pay | Admitting: Internal Medicine

## 2021-07-20 DIAGNOSIS — I255 Ischemic cardiomyopathy: Secondary | ICD-10-CM

## 2021-07-20 DIAGNOSIS — I5022 Chronic systolic (congestive) heart failure: Secondary | ICD-10-CM

## 2021-07-20 DIAGNOSIS — R0602 Shortness of breath: Secondary | ICD-10-CM

## 2021-07-20 NOTE — Telephone Encounter (Signed)
Pt called that since he had COVID this oast July.. he has been very SOB with minimal exertion and very lethargic and just not bouncing back well.. he says his PCP Dr. Alain Marion 07/03/21 and he told him that it may take a ling time for him to feel better but he is worried about the SOB... he denies cough, chest pain, no edema... he is asking for cardio advice from Dr. [name].. he wants to see him in [place] but he is full until 10/2021... his last Echo was 02/2021 EF 40-45%... I advised that I will forward to his [place] nurse to see if Dr. [name] has a cancellation sooner... I will forward to Dr. [name] for review.   Pt will also call Dr. Alain Marion to try and move up his 07/2021 appt sooner.

## 2021-07-20 NOTE — Telephone Encounter (Signed)
Patient states in July he tested positive for COVID--shortly after, he assumes he had either bronchitis or pneumonia, but wasn't diagnosed. He states ever since then he has been SOB with any exertion.  Pt c/o Shortness Of Breath: STAT if SOB developed within the last 24 hours or pt is noticeably SOB on the phone  1. Are you currently SOB (can you hear that pt is SOB on the phone)?  No   2. How long have you been experiencing SOB?  Since July when patient had COVID   3. Are you SOB when sitting or when up moving around?  When up and moving around   4. Are you currently experiencing any other symptoms?  Tired

## 2021-07-24 NOTE — Telephone Encounter (Signed)
Lets plan CXR , and if abnormal >> Chest CT  Also 2 D echo and BNP  And followup OV

## 2021-07-24 NOTE — Telephone Encounter (Signed)
I spoke with the patient.  I have advised him of Dr. Olin Pia recommendations to: 1) have a chest x-ray & BNP (which can be done on a walk in basis at the [facility]  2) have an echo done  3) follow up in the office after testing is completed.  The patient voices understanding of these recommendations and is agreeable. He advised due to his SOB he has not even been outside today.  He is scheduled to see Dr. Alain Marion on Friday this week.  He will try to come for the chest x-ray and lab work tomorrow.   He is aware scheduling will touch base with him to set up the echo.

## 2021-07-25 ENCOUNTER — Other Ambulatory Visit
Admission: RE | Admit: 2021-07-25 | Discharge: 2021-07-25 | Disposition: A | Discharge status: Home / Self Care | Payer: PPO | Source: Home / Self Care | Attending: Internal Medicine | Admitting: Internal Medicine

## 2021-07-25 ENCOUNTER — Encounter: Payer: PPO | Admitting: Dermatology

## 2021-07-25 ENCOUNTER — Ambulatory Visit
Admission: RE | Admit: 2021-07-25 | Discharge: 2021-07-25 | Disposition: A | Discharge status: Home / Self Care | Payer: PPO | Attending: Internal Medicine | Admitting: Internal Medicine

## 2021-07-25 ENCOUNTER — Ambulatory Visit
Admission: RE | Admit: 2021-07-25 | Discharge: 2021-07-25 | Disposition: A | Discharge status: Home / Self Care | Payer: PPO | Source: Ambulatory Visit | Attending: Internal Medicine | Admitting: Internal Medicine

## 2021-07-25 DIAGNOSIS — J439 Emphysema, unspecified: Secondary | ICD-10-CM | POA: Diagnosis not present

## 2021-07-25 DIAGNOSIS — I509 Heart failure, unspecified: Secondary | ICD-10-CM | POA: Diagnosis not present

## 2021-07-25 DIAGNOSIS — R0602 Shortness of breath: Secondary | ICD-10-CM | POA: Insufficient documentation

## 2021-07-25 DIAGNOSIS — I5022 Chronic systolic (congestive) heart failure: Secondary | ICD-10-CM

## 2021-07-25 DIAGNOSIS — J449 Chronic obstructive pulmonary disease, unspecified: Secondary | ICD-10-CM | POA: Diagnosis not present

## 2021-07-25 LAB — BRAIN NATRIURETIC PEPTIDE: B Natriuretic Peptide: 55.2 pg/mL (ref 0.0–100.0)

## 2021-07-26 ENCOUNTER — Encounter: Payer: Self-pay | Admitting: Internal Medicine

## 2021-07-26 ENCOUNTER — Ambulatory Visit (INDEPENDENT_AMBULATORY_CARE_PROVIDER_SITE_OTHER): Payer: PPO | Admitting: Internal Medicine

## 2021-07-26 ENCOUNTER — Ambulatory Visit (INDEPENDENT_AMBULATORY_CARE_PROVIDER_SITE_OTHER): Payer: PPO | Admitting: Endocrinology

## 2021-07-26 ENCOUNTER — Other Ambulatory Visit: Payer: Self-pay

## 2021-07-26 DIAGNOSIS — I480 Paroxysmal atrial fibrillation: Secondary | ICD-10-CM

## 2021-07-26 DIAGNOSIS — F419 Anxiety disorder, unspecified: Secondary | ICD-10-CM

## 2021-07-26 DIAGNOSIS — G9332 Myalgic encephalomyelitis/chronic fatigue syndrome: Secondary | ICD-10-CM

## 2021-07-26 DIAGNOSIS — R453 Demoralization and apathy: Secondary | ICD-10-CM | POA: Diagnosis not present

## 2021-07-26 DIAGNOSIS — R197 Diarrhea, unspecified: Secondary | ICD-10-CM

## 2021-07-26 DIAGNOSIS — M810 Age-related osteoporosis without current pathological fracture: Secondary | ICD-10-CM | POA: Diagnosis not present

## 2021-07-26 DIAGNOSIS — I255 Ischemic cardiomyopathy: Secondary | ICD-10-CM

## 2021-07-26 DIAGNOSIS — R5382 Chronic fatigue, unspecified: Secondary | ICD-10-CM | POA: Diagnosis not present

## 2021-07-26 DIAGNOSIS — U099 Post covid-19 condition, unspecified: Secondary | ICD-10-CM

## 2021-07-26 DIAGNOSIS — R634 Abnormal weight loss: Secondary | ICD-10-CM

## 2021-07-26 LAB — BASIC METABOLIC PANEL
BUN: 23 mg/dL (ref 6–23)
CO2: 29 mEq/L (ref 19–32)
Calcium: 10.2 mg/dL (ref 8.4–10.5)
Chloride: 101 mEq/L (ref 96–112)
Creatinine, Ser: 0.97 mg/dL (ref 0.40–1.50)
GFR: 70.54 mL/min (ref >60.00)
Glucose, Bld: 89 mg/dL (ref 70–99)
Potassium: 4.3 mEq/L (ref 3.5–5.1)
Sodium: 135 mEq/L (ref 135–145)

## 2021-07-26 LAB — CBC WITH DIFFERENTIAL/PLATELET
Basophils Absolute: 0 10*3/uL (ref 0.0–0.1)
Basophils Relative: 0.4 % (ref 0.0–3.0)
Eosinophils Absolute: 0.2 10*3/uL (ref 0.0–0.7)
Eosinophils Relative: 3.7 % (ref 0.0–5.0)
HCT: 32.4 % — ABNORMAL LOW (ref 39.0–52.0)
Hemoglobin: 10.8 g/dL — ABNORMAL LOW (ref 13.0–17.0)
Lymphocytes Relative: 24.7 % (ref 12.0–46.0)
Lymphs Abs: 1.5 10*3/uL (ref 0.7–4.0)
MCHC: 33.2 g/dL (ref 30.0–36.0)
MCV: 92.2 fl (ref 78.0–100.0)
Monocytes Absolute: 0.5 10*3/uL (ref 0.1–1.0)
Monocytes Relative: 8.1 % (ref 3.0–12.0)
Neutro Abs: 3.7 10*3/uL (ref 1.4–7.7)
Neutrophils Relative %: 63.1 % (ref 43.0–77.0)
Platelets: 140 10*3/uL — ABNORMAL LOW (ref 150.0–400.0)
RBC: 3.51 Mil/uL — ABNORMAL LOW (ref 4.22–5.81)
RDW: 13.8 % (ref 11.5–15.5)
WBC: 5.9 10*3/uL (ref 4.0–10.5)

## 2021-07-26 LAB — TSH: TSH: 2.21 u[IU]/mL (ref 0.35–5.50)

## 2021-07-26 MED ORDER — ESCITALOPRAM OXALATE 10 MG PO TABS: 10.0000 mg | ORAL_TABLET | Freq: Every day | ORAL | 5 refills | Status: DC

## 2021-07-26 MED ORDER — BENZONATATE 100 MG PO CAPS: 100.0000 mg | ORAL_CAPSULE | Freq: Three times a day (TID) | ORAL | 1 refills | Status: DC | PRN

## 2021-07-26 NOTE — Progress Notes (Signed)
Subjective:  Patient ID: [name] Christensen, male    DOB: 09-Jul-1932  Age: 87 y.o. MRN: [id number]  CC: Follow-up (6 week f/u)   HPI ABIMAEL DOHRN presents for [name] presents for post-COVID fatigue.  It is better.  He is here with his daughter F/u diarrhea - stopped F/u CHF, CAD, memory problems Low BP off BP meds  Outpatient Medications Prior to Visit  Medication Sig Dispense Refill   acetaminophen (TYLENOL) 500 MG tablet Take 500 mg by mouth at bedtime.     alfuzosin (UROXATRAL) 10 MG 24 hr tablet Take 10 mg by mouth every evening.      atorvastatin (LIPITOR) 20 MG tablet TAKE 1 TABLET BY MOUTH ONCE DAILY 90 tablet 3   betamethasone dipropionate 0.05 % cream Apply topically 2 (two) times daily. 30 g 1   cinacalcet (SENSIPAR) 30 MG tablet Take 1 tablet (30 mg total) by mouth 3 (three) times a week. 40 tablet 3   dofetilide (TIKOSYN) 250 MCG capsule TAKE ONE CAPSULE BY MOUTH TWICE A DAY 180 capsule 3   dutasteride (AVODART) 0.5 MG capsule Take 0.5 mg by mouth every evening.      ELIQUIS 5 MG TABS tablet TAKE 1 TABLET BY MOUTH TWICE (2) DAILY 180 tablet 3   ENTRESTO 24-26 MG TAKE 1 TABLET BY MOUTH TWICE A DAY 180 tablet 3   escitalopram (LEXAPRO) 5 MG tablet Take 1 tablet (5 mg total) by mouth daily. 30 tablet 5   linaclotide (LINZESS) 290 MCG CAPS capsule Take 1 capsule (290 mcg total) by mouth daily as needed (constipation). 90 capsule 3   losartan (COZAAR) 50 MG tablet TAKE 1 TABLET BY MOUTH ONCE A DAY 90 tablet 3   magnesium oxide (MAG-OX) 400 (241.3 Mg) MG tablet TAKE 1 TABLET BY MOUTH ONCE DAILY 30 tablet 6   metoprolol succinate (TOPROL-XL) 50 MG 24 hr tablet TAKE 1 TABLET BY MOUTH TWICE A DAY. TAKEWITH OR IMMEDIATELY FOLLOWING A MEAL 180 tablet 3   Multiple Vitamin (MULTIVITAMIN WITH MINERALS) TABS tablet Take 1 tablet by mouth every other day.     nitroGLYCERIN (NITROSTAT) 0.4 MG SL tablet DISSOLVE 1 TABLET UNDER TONGUE AS NEEDEDFOR CHEST PAIN. MAY REPEAT 5 MINUTES APART  3 TIMES IF NEEDED 25 tablet 3   Polyethyl Glycol-Propyl Glycol (SYSTANE OP) Place 1 drop into both eyes daily as needed (burning eyes).      triamcinolone cream (KENALOG) 0.1 % APPLY 1 APPLICATION TOPICALLY TWO TIMES DAILY AS NEEDED AVOIDING FACE, GROIN ANDARMPITS 80 g 0   vitamin B-12 (CYANOCOBALAMIN) 1000 MCG tablet Take 1,000 mcg by mouth daily.     esomeprazole (NEXIUM) 20 MG capsule Take 1 capsule (20 mg total) by mouth daily at 12 noon. (Patient not taking: Reported on 07/26/2021) 30 capsule 3   No facility-administered medications prior to visit.    ROS: Review of Systems  Constitutional:  Positive for fatigue. Negative for appetite change and unexpected weight change.  HENT:  Negative for congestion, nosebleeds, sneezing, sore throat and trouble swallowing.   Eyes:  Negative for itching and visual disturbance.  Respiratory:  Negative for cough.   Cardiovascular:  Negative for chest pain, palpitations and leg swelling.  Gastrointestinal:  Negative for abdominal distention, blood in stool, diarrhea and nausea.  Genitourinary:  Negative for frequency and hematuria.  Musculoskeletal:  Negative for arthralgias, back pain, gait problem, joint swelling and neck pain.  Skin:  Negative for rash.  Neurological:  Positive for weakness. Negative  for dizziness, tremors and speech difficulty.  Hematological:  Negative for adenopathy. Does not bruise/bleed easily.  Psychiatric/Behavioral:  Positive for decreased concentration. Negative for agitation, confusion, dysphoric mood, sleep disturbance and suicidal ideas. The patient is not nervous/anxious.    Objective:  BP 110/60 (BP Location: Left Arm)   Pulse 65   Temp 98 F (36.7 C) (Oral)   Ht '5\' 7"'$  (1.702 m)   Wt 134 lb 3.2 oz (60.9 kg)   SpO2 97%   BMI 21.02 kg/m   BP Readings from Last 3 Encounters:  07/26/21 110/60  07/26/21 134/60  07/03/21 130/72    Wt Readings from Last 3 Encounters:  07/26/21 134 lb 3.2 oz (60.9 kg)  07/26/21  134 lb (60.8 kg)  07/03/21 131 lb 12.8 oz (59.8 kg)    Physical Exam Constitutional:      General: He is not in acute distress.    Appearance: Normal appearance. He is well-developed and normal weight.     Comments: NAD  Eyes:     Conjunctiva/sclera: Conjunctivae normal.     Pupils: Pupils are equal, round, and reactive to light.  Neck:     Thyroid: No thyromegaly.     Vascular: No JVD.  Cardiovascular:     Rate and Rhythm: Normal rate and regular rhythm.     Heart sounds: Normal heart sounds. No murmur heard.   No friction rub. No gallop.  Pulmonary:     Effort: Pulmonary effort is normal. No respiratory distress.     Breath sounds: Normal breath sounds. No wheezing or rales.  Chest:     Chest wall: No tenderness.  Abdominal:     General: Bowel sounds are normal. There is no distension.     Palpations: Abdomen is soft. There is no mass.     Tenderness: There is no abdominal tenderness. There is no guarding or rebound.  Musculoskeletal:        General: No tenderness. Normal range of motion.     Cervical back: Normal range of motion.  Lymphadenopathy:     Cervical: No cervical adenopathy.  Skin:    General: Skin is warm and dry.     Findings: No rash.  Neurological:     Mental Status: He is alert and oriented to person, place, and time.     Cranial Nerves: No cranial nerve deficit.     Motor: No abnormal muscle tone.     Coordination: Coordination normal.     Gait: Gait normal.     Deep Tendon Reflexes: Reflexes are normal and symmetric.  Psychiatric:        Behavior: Behavior normal.        Thought Content: Thought content normal.    Lab Results  Component Value Date   WBC 6.4 01/01/2021   HGB 12.8 (L) 01/01/2021   HCT 38.0 (L) 01/01/2021   PLT 121.0 (L) 01/01/2021   GLUCOSE 101 (H) 04/13/2021   CHOL 107 12/30/2019   TRIG 102.0 12/30/2019   HDL 46.80 12/30/2019   LDLCALC 39 12/30/2019   ALT 12 01/19/2021   AST 24 01/19/2021   NA 132 (L) 04/13/2021   K 4.5  04/13/2021   CL 97 04/13/2021   CREATININE 1.17 04/13/2021   BUN 20 04/13/2021   CO2 29 04/13/2021   TSH 2.51 12/30/2019   PSA 12.45 (H) 02/06/2016   INR 1.1 04/02/2017   HGBA1C 6.1 12/30/2019    DG Chest 2 View  Result Date: 07/25/2021 CLINICAL DATA:  Shortness  of breath, chronic systolic heart failure, increased shortness of breath with exertion, history coronary artery disease post MI and CABG, hypertension, CHF EXAM: CHEST - 2 VIEW COMPARISON:  01/24/2021 FINDINGS: LEFT subclavian ICD with leads projecting at RIGHT atrium and RIGHT ventricle. Normal heart size post CABG. Mediastinal contours and pulmonary vascularity normal. Atherosclerotic calcification aorta. Emphysematous and bronchitic changes consistent with COPD. Chronic interstitial lung disease and mid to lower lungs bilaterally. No acute infiltrate, pleural effusion, or pneumothorax. Bones demineralized. IMPRESSION: Post CABG and ICD. COPD changes with basilar chronic interstitial lung disease. No acute abnormalities. Aortic Atherosclerosis (ICD10-I70.0) and Emphysema (ICD10-J43.9). Electronically Signed   By: Lavonia Dana M.D.   On: 07/25/2021 17:57    Assessment & Plan:     Walker Kehr, MD

## 2021-07-26 NOTE — Assessment & Plan Note (Signed)
A little better Eat well Ensure, coffee

## 2021-07-26 NOTE — Patient Instructions (Addendum)
Blood tests are requested for you today, to do at Dr Plotnikov's office. Please call [phone] to schedule a Bone Density (DexaScan) a the [facility] office at [place].   Based on the results, I may need to prescribe for you a once per month pill for the bones.  Please come back for a follow-up appointment in 3 months.

## 2021-07-26 NOTE — Assessment & Plan Note (Signed)
Cardiac ECHO is ordered

## 2021-07-26 NOTE — Progress Notes (Signed)
Remote ICD transmission.   

## 2021-07-26 NOTE — Assessment & Plan Note (Signed)
Not much better. On Lexapro - we can try to increase Lexapro to 10 mg/d

## 2021-07-26 NOTE — Progress Notes (Signed)
Subjective:    Patient ID: [name] Christensen, male    DOB: [birth year], 87 y.o.   MRN: [id number]  HPI Pt returns for f/u of hypocalcuric hypercalcemia (dx'ed 2016 (Ca++ was normal in 2015); other hypercalcemia w/u was neg; he has never had bony fx; due to memory loss, he was rx'ed cinacalcet in 2022; he has AF).  He takes cinacalcet as rx'ed.  dtr says no change in memory loss.  Pt says he still has some generalized weakness from recent COVID infection.   Past Medical History:  Diagnosis Date   Actinic keratosis 06/09/2019   L sup forehead at hairline - bx proven   AICD (automatic cardioverter/defibrillator) present 04/07/2017   Allergy    Anxiety    BPH (benign prostatic hypertrophy)    elevated PSA Dr. Lindell Noe Bx 2010   Cardiac arrest [facility])    a. out-of-hospital arrest 10/26/2012 - EF 40-45%, patent grafts on cath, received [employer] AICD.   Carotid disease, bilateral (HCC)    a. 0-39% by doppers.   Cataract    bil cataracts removed   CHF (congestive heart failure) (HCC)    Coronary artery disease    a. s/p MI/CABG 2005. b. s/p cath at time of VF arrest 10/2013 - grafts patent.   Cough    Diverticulosis    Elevated LFTs    a. 10/2012 felt due to cardiac arrest - Hepatitis C Ab reactive from 11/13/2012>>Hep C RNA PCR negative 2012/11/13.    GERD (gastroesophageal reflux disease)    Hyperlipidemia    Hypertension    Internal hemorrhoids    Myocardial infarction (HCC)    2005 - CABG x 5 2005   RBBB    Tubular adenoma of colon    Ventricular bigeminy    a. Event monitor 01/2013: NSR with PVCs and occ bigeminy.    Past Surgical History:  Procedure Laterality Date   APPENDECTOMY     CARDIAC CATHETERIZATION  2007   with patent graft anatomy atretic left internal mammary  artery to the LAD which is nonobstructive. Will restart study June 08, 2007   CARDIOVERSION N/A 03/27/2018   Procedure: CARDIOVERSION;  Surgeon: Iran Ouch, MD;  Location: [facility] ORS;  Service: Cardiovascular;   Laterality: N/A;   CARDIOVERSION N/A 01/04/2019   Procedure: CARDIOVERSION (CATH LAB);  Surgeon: Iran Ouch, MD;  Location: [facility] ORS;  Service: Cardiovascular;  Laterality: N/A;   CARDIOVERSION N/A 07/07/2019   Procedure: CARDIOVERSION;  Surgeon: Wendall Stade, MD;  Location: [facility] ENDOSCOPY;  Service: Cardiovascular;  Laterality: N/A;   CATARACT EXTRACTION W/PHACO Right 04/23/2016   Procedure: CATARACT EXTRACTION PHACO AND INTRAOCULAR LENS PLACEMENT (IOC);  Surgeon: [name], MD;  Location: [facility] ORS;  Service: Ophthalmology;  Laterality: Right;  [place] 1.09 AP% 19.3 CDE 13.38 Fluid pack lot # [id number]   CHOLECYSTECTOMY N/A 06/05/2017   Procedure: LAPAROSCOPIC CHOLECYSTECTOMY WITH INTRAOPERATIVE CHOLANGIOGRAM ERAS PATHWAY POSSIBLE NEEDLE CORE BIOPSY OF LIVER;  Surgeon: [name], MD;  Location: [facility] OR;  Service: General;  Laterality: N/A;  ERAS PATHWAY   COLONOSCOPY     CORONARY ARTERY BYPASS GRAFT  2005   EYE SURGERY     ICD GENERATOR CHANGEOUT N/A 04/07/2017   Procedure: ICD Generator Changeout;  Surgeon: Duke Salvia, MD;  Location: [facility] INVASIVE CV LAB;  Service: Cardiovascular;  Laterality: N/A;   IMPLANTABLE CARDIOVERTER DEFIBRILLATOR IMPLANT N/A 11/13/2012   STJ single chamber ICD implanted by Dr Graciela Husbands for cardiac arrest    INGUINAL HERNIA REPAIR  Bilateral 01/17/2015   Procedure: BILATERAL LAPAROSCOPIC INGUINAL HERNIA REPAIR WITH LEFT FEMORAL HERNIA REPAIR;  Surgeon: [name], MD;  Location: [facility] OR;  Service: General;  Laterality: Bilateral;   INSERTION OF MESH Bilateral 01/17/2015   Procedure: INSERTION OF MESH;  Surgeon: [name], MD;  Location: [facility] OR;  Service: General;  Laterality: Bilateral;   LAPAROSCOPIC CHOLECYSTECTOMY  06/05/2017   LEAD INSERTION N/A 04/07/2017   Procedure: RA Lead Insertion;  Surgeon: Duke Salvia, MD;  Location: [facility] INVASIVE CV LAB;  Service: Cardiovascular;  Laterality: N/A;   LEAD REVISION/REPAIR N/A 04/08/2017   Procedure: Atrial Lead  Revision/Repair;  Surgeon: Duke Salvia, MD;  Location: [facility] INVASIVE CV LAB;  Service: Cardiovascular;  Laterality: N/A;   LEFT HEART CATH AND CORONARY ANGIOGRAPHY Left 05/14/2019   Procedure: LEFT HEART CATH AND CORONARY ANGIOGRAPHY;  Surgeon: Iran Ouch, MD;  Location: [facility] INVASIVE CV LAB;  Service: Cardiovascular;  Laterality: Left;   LEFT HEART CATHETERIZATION WITH CORONARY/GRAFT ANGIOGRAM N/A 11/15/2012   Procedure: LEFT HEART CATHETERIZATION WITH Isabel Caprice;  Surgeon: [name], MD;  Location: [facility] CATH LAB;  Service: Cardiovascular;  Laterality: N/A;   LIVER BIOPSY N/A 06/05/2017   Procedure: SINGLE SITE LIVER BIOPSY ERAS PATHWAY;  Surgeon: [name], MD;  Location: [facility] OR;  Service: General;  Laterality: N/A;  ERAS PATHWAY   LUMBAR FUSION  09/2007    Social History   Socioeconomic History   Marital status: Married    Spouse name: Not on file   Number of children: 5   Years of education: Not on file   Highest education level: Not on file  Occupational History   Occupation: Realtor    Employer: Advertising copywriter  Tobacco Use   Smoking status: Never   Smokeless tobacco: Never  Vaping Use   Vaping Use: Never used  Substance and Sexual Activity   Alcohol use: No   Drug use: No   Sexual activity: Never  Other Topics Concern   Not on file  Social History Narrative   Not on file   Social Determinants of Health   Financial Resource Strain: Low Risk    Difficulty of Paying Living Expenses: Not hard at all  Food Insecurity: No Food Insecurity   Worried About Programme researcher, broadcasting/film/video in the Last Year: Never true   Ran Out of Food in the Last Year: Never true  Transportation Needs: No Transportation Needs   Lack of Transportation (Medical): No   Lack of Transportation (Non-Medical): No  Physical Activity: Sufficiently Active   Days of Exercise per Week: 6 days   Minutes of Exercise per Session: 30 min  Stress: Stress Concern Present   Feeling of Stress  : To some extent  Social Connections: Press photographer of Communication with Friends and Family: More than three times a week   Frequency of Social Gatherings with Friends and Family: Once a week   Attends Religious Services: More than 4 times per year   Active Member of [employer] or Organizations: No   Attends Engineer, structural: More than 4 times per year   Marital Status: Married  Catering manager Violence: Not At Risk   Fear of Current or Ex-Partner: No   Emotionally Abused: No   Physically Abused: No   Sexually Abused: No    Current Outpatient Medications on File Prior to Visit  Medication Sig Dispense Refill   acetaminophen (TYLENOL) 500 MG tablet Take 500 mg by mouth at bedtime.  alfuzosin (UROXATRAL) 10 MG 24 hr tablet Take 10 mg by mouth every evening.      atorvastatin (LIPITOR) 20 MG tablet TAKE 1 TABLET BY MOUTH ONCE DAILY 90 tablet 3   betamethasone dipropionate 0.05 % cream Apply topically 2 (two) times daily. 30 g 1   cinacalcet (SENSIPAR) 30 MG tablet Take 1 tablet (30 mg total) by mouth 3 (three) times a week. 40 tablet 3   dofetilide (TIKOSYN) 250 MCG capsule TAKE ONE CAPSULE BY MOUTH TWICE A DAY 180 capsule 3   dutasteride (AVODART) 0.5 MG capsule Take 0.5 mg by mouth every evening.      ELIQUIS 5 MG TABS tablet TAKE 1 TABLET BY MOUTH TWICE (2) DAILY 180 tablet 3   ENTRESTO 24-26 MG TAKE 1 TABLET BY MOUTH TWICE A DAY 180 tablet 3   linaclotide (LINZESS) 290 MCG CAPS capsule Take 1 capsule (290 mcg total) by mouth daily as needed (constipation). 90 capsule 3   losartan (COZAAR) 50 MG tablet TAKE 1 TABLET BY MOUTH ONCE A DAY 90 tablet 3   magnesium oxide (MAG-OX) 400 (241.3 Mg) MG tablet TAKE 1 TABLET BY MOUTH ONCE DAILY 30 tablet 6   metoprolol succinate (TOPROL-XL) 50 MG 24 hr tablet TAKE 1 TABLET BY MOUTH TWICE A DAY. TAKEWITH OR IMMEDIATELY FOLLOWING A MEAL 180 tablet 3   Multiple Vitamin (MULTIVITAMIN WITH MINERALS) TABS tablet Take 1  tablet by mouth every other day.     nitroGLYCERIN (NITROSTAT) 0.4 MG SL tablet DISSOLVE 1 TABLET UNDER TONGUE AS NEEDEDFOR CHEST PAIN. MAY REPEAT 5 MINUTES APART 3 TIMES IF NEEDED 25 tablet 3   Polyethyl Glycol-Propyl Glycol (SYSTANE OP) Place 1 drop into both eyes daily as needed (burning eyes).      triamcinolone cream (KENALOG) 0.1 % APPLY 1 APPLICATION TOPICALLY TWO TIMES DAILY AS NEEDED AVOIDING FACE, GROIN ANDARMPITS 80 g 0   vitamin B-12 (CYANOCOBALAMIN) 1000 MCG tablet Take 1,000 mcg by mouth daily.     No current facility-administered medications on file prior to visit.    Allergies  Allergen Reactions   Ezetimibe Other (See Comments)    Pt was in coma for 6 days, numbness   Penicillins Other (See Comments)    BLISTERS BETWEEN FINGERS Did it involve swelling of the face/tongue/throat, SOB, or low BP? No Did it involve sudden or severe rash/hives, skin peeling, or any reaction on the inside of your mouth or nose? Yes Did you need to seek medical attention at a hospital or doctor's office? No When did it last happen?      60 years If all above answers are "NO", may proceed with cephalosporin use.    Pravastatin Sodium Other (See Comments)    Aches and pains in joints   Rosuvastatin Other (See Comments)    MYALGIAS   Niacin Anxiety and Other (See Comments)    "Makes me feel nervous, jittery"    Family History  Problem Relation Age of Onset   Coronary artery disease Mother    Heart attack Brother    Stomach cancer Neg Hx    Colon cancer Neg Hx    Esophageal cancer Neg Hx    Pancreatic cancer Neg Hx    Prostate cancer Neg Hx    Rectal cancer Neg Hx    Hypercalcemia Neg Hx     BP 134/60 (BP Location: Right Arm, Patient Position: Sitting, Cuff Size: Normal)   Pulse 75   Ht 5\' 7"  (1.702 m)   Wt 134  lb (60.8 kg)   SpO2 97%   BMI 20.99 kg/m    Review of Systems     Objective:   Physical Exam GAIT: normal and steady  Lab Results  Component Value Date    CREATININE 0.97 07/26/2021   BUN 23 07/26/2021   NA 135 07/26/2021   K 4.3 07/26/2021   CL 101 07/26/2021   CO2 29 07/26/2021   Lab Results  Component Value Date   PTH 47 01/24/2021   CALCIUM 10.2 07/26/2021   CAION 1.40 (H) 04/06/2013   PHOS 2.6 11/15/2012    CXR: osteoporosis.      Assessment & Plan:  Hypocalcuric hypercalcemia: well-controlled.  Please continue the same cinacalcet Osteoporosis, new.  Check DEXA.

## 2021-07-26 NOTE — Assessment & Plan Note (Signed)
Cardiac ECHO is ordered Better

## 2021-07-27 ENCOUNTER — Other Ambulatory Visit: Payer: Self-pay | Admitting: Internal Medicine

## 2021-07-27 DIAGNOSIS — I4892 Unspecified atrial flutter: Secondary | ICD-10-CM

## 2021-07-27 DIAGNOSIS — D649 Anemia, unspecified: Secondary | ICD-10-CM

## 2021-07-27 DIAGNOSIS — R634 Abnormal weight loss: Secondary | ICD-10-CM | POA: Diagnosis not present

## 2021-07-27 DIAGNOSIS — R197 Diarrhea, unspecified: Secondary | ICD-10-CM | POA: Diagnosis not present

## 2021-07-27 NOTE — Telephone Encounter (Signed)
Reviewed the patient's chart- echo is not scheduled- will forward to scheduling to please call and arrange.  1) Chest x-ray: (done 07/25/21) IMPRESSION: Post CABG and ICD.   COPD changes with basilar chronic interstitial lung disease.   No acute abnormalities.   Aortic Atherosclerosis (ICD10-I70.0) and Emphysema (ICD10-J43.9).  2) BNP: (done 07/25/21)- 55.2  The patient did see his PCP on 07/26/21.

## 2021-07-31 LAB — SPECIMEN STATUS REPORT

## 2021-07-31 LAB — CLOSTRIDIUM DIFFICILE EIA: C difficile Toxins A+B, EIA: NEGATIVE

## 2021-07-31 NOTE — Telephone Encounter (Signed)
I spoke with the patient to let him know Dr. [name] has seen his BNP/ Chest X-ray results. He advised Dr. Alain Marion had reviewed these with him at his office visit last week and he was also pleased with the results.   I inquired if Dr. Alain Marion had any recommendations for his and the patient advised that he was told he looked better at his recent office visit and he will need to give it at least a couple more months post COVID to feel better.  The patient advised he had a great day Friday / Saturday, an ok day on Sunday, and felt down again on Monday.   I advised we will have him proceed with the echocardiogram that is scheduled on 08/23/21. I have offered to schedule him to see Dr. [name] back on 08/28/21 and he advised he would definitely like to follow up in clinic.   He was agreeable with the appointment on 10/4 with Dr. [name] at 8:20 am.  The patient was very appreciative of the call back today and advised to call back prior to his scheduled follow up with any further questions/ concerns.

## 2021-08-14 ENCOUNTER — Ambulatory Visit: Payer: PPO | Admitting: Internal Medicine

## 2021-08-23 ENCOUNTER — Ambulatory Visit (INDEPENDENT_AMBULATORY_CARE_PROVIDER_SITE_OTHER): Payer: PPO

## 2021-08-23 ENCOUNTER — Other Ambulatory Visit: Payer: Self-pay

## 2021-08-23 DIAGNOSIS — I255 Ischemic cardiomyopathy: Secondary | ICD-10-CM

## 2021-08-23 DIAGNOSIS — I5022 Chronic systolic (congestive) heart failure: Secondary | ICD-10-CM | POA: Diagnosis not present

## 2021-08-23 DIAGNOSIS — R0602 Shortness of breath: Secondary | ICD-10-CM

## 2021-08-24 LAB — ECHOCARDIOGRAM COMPLETE
AR max vel: 1.78 cm2
AV Area VTI: 1.78 cm2
AV Area mean vel: 1.48 cm2
AV Mean grad: 6 mmHg
AV Peak grad: 8.5 mmHg
Ao pk vel: 1.46 m/s
Area-P 1/2: 3.6 cm2
Calc EF: 47.7 %
S' Lateral: 3.6 cm
Single Plane A2C EF: 45.8 %
Single Plane A4C EF: 46.5 %

## 2021-08-28 ENCOUNTER — Ambulatory Visit (INDEPENDENT_AMBULATORY_CARE_PROVIDER_SITE_OTHER): Payer: PPO | Admitting: Internal Medicine

## 2021-08-28 ENCOUNTER — Other Ambulatory Visit: Payer: Self-pay

## 2021-08-28 ENCOUNTER — Encounter: Payer: Self-pay | Admitting: Internal Medicine

## 2021-08-28 VITALS — BP 110/60 | HR 75 | Ht 67.0 in | Wt 138.0 lb

## 2021-08-28 DIAGNOSIS — Z95810 Presence of automatic (implantable) cardiac defibrillator: Secondary | ICD-10-CM | POA: Diagnosis not present

## 2021-08-28 DIAGNOSIS — I4819 Other persistent atrial fibrillation: Secondary | ICD-10-CM | POA: Diagnosis not present

## 2021-08-28 DIAGNOSIS — I5022 Chronic systolic (congestive) heart failure: Secondary | ICD-10-CM | POA: Diagnosis not present

## 2021-08-28 DIAGNOSIS — Z79899 Other long term (current) drug therapy: Secondary | ICD-10-CM

## 2021-08-28 DIAGNOSIS — I469 Cardiac arrest, cause unspecified: Secondary | ICD-10-CM | POA: Diagnosis not present

## 2021-08-28 DIAGNOSIS — I493 Ventricular premature depolarization: Secondary | ICD-10-CM | POA: Diagnosis not present

## 2021-08-28 DIAGNOSIS — I255 Ischemic cardiomyopathy: Secondary | ICD-10-CM

## 2021-08-28 DIAGNOSIS — I495 Sick sinus syndrome: Secondary | ICD-10-CM | POA: Diagnosis not present

## 2021-08-28 LAB — PACEMAKER DEVICE OBSERVATION

## 2021-08-28 NOTE — Patient Instructions (Signed)
Medication Instructions:  - Your physician recommends that you continue on your current medications as directed. Please refer to the Current Medication list given to you today.  *If you need a refill on your cardiac medications before your next appointment, please call your pharmacy*   Lab Work: - none ordered  If you have labs (blood work) drawn today and your tests are completely normal, you will receive your results only by: [place] (if you have MyChart) OR A paper copy in the mail If you have any lab test that is abnormal or we need to change your treatment, we will call you to review the results.   Testing/Procedures: - none ordered   Follow-Up: At [facility], you and your health needs are our priority.  As part of our continuing mission to provide you with exceptional heart care, we have created designated Provider Care Teams.  These Care Teams include your primary Cardiologist (physician) and Advanced Practice Providers (APPs -  Physician Assistants and Nurse Practitioners) who all work together to provide you with the care you need, when you need it.  We recommend signing up for the patient portal called "MyChart".  Sign up information is provided on this After Visit Summary.  MyChart is used to connect with patients for Virtual Visits (Telemedicine).  Patients are able to view lab/test results, encounter notes, upcoming appointments, etc.  Non-urgent messages can be sent to your provider as well.   To learn more about what you can do with MyChart, go to [email or web address].    Your next appointment:   6 month(s)  The format for your next appointment:   In Person  Provider:   Virl Axe, MD   Other Instructions N/a

## 2021-08-28 NOTE — Progress Notes (Signed)
Patient Care Team: Plotnikov, Georgina Quint, MD as PCP - General (Internal Medicine) Wendall Stade, MD as PCP - Cardiology (Cardiology) Duke Salvia, MD as PCP - Electrophysiology (Cardiology) Lindell Noe Lehman Prom., MD (Urology) [name], MD as Consulting Physician (General Surgery) Pyrtle, Carie Caddy, MD as Consulting Physician (Gastroenterology) [name], MD as Referring Physician (Ophthalmology)   HPI  [name] Christensen is a 87 y.o. male   seen in follow-up for ischemic cardiomyopathy with progressive left ventricular dysfunction in the context of frequent PVCs and chronic congestive heart failure.  He has persistent atrial fibrillation for which he was started on dofetilide 8/20    Have been considering AFib, Pacing and PVCs as cause of his symptoms and LV dtysfunction>> reprogramming of his device eliminated pacing ( RBBB), dofetilide >>sinus, so the PVCs remain unaddressed with failed Anti-arrhythmic drugs suppression.  Most recently tried to increase rate for overdrive suppression, with some concern about encroaching on AV conduction and triggering RV apical pacing    The patient denies chest pain, shortness of breath, nocturnal dyspnea, orthopnea or peripheral edema.  There have been no palpitations, lightheadedness or syncope.  Complains of lassitude, good days and bad days.  His wife notes that since retirement at the end of June, he is doing a lot of sitting in the chair. He also had COVID a couple months ago; the good days and bad days are getting better.  Cough has abated.[name]       DATE TEST PVC  1/15 Holter 13%  1/18 Holter 31%  2/20 ZIO 7.2% ( all one morphology)   9/20 Pacer 12%  11/20 Pacer 11%  2/21 Pacer 9.4%  9/21  13%      DATE TEST EF    12/13 Cath   Patent Grafts  2/15 Echo   45-50 %    12/17 Echo   50-55 %    4/19 Echo 20-25%    10/19 Echo  25-30%    1/20 Echo  20-25%    6/20 LHC 20-25% LIMA-LADatretic SVG-OM,SVG-D with filling of  LAD,SVG-OM patent  11/20 Echo  25-30%   4/22 Echo  40-45%   9/22 Echo   40-45%     Date Cr K Mg Hgb  6/20 1.14 4.9 2.0(3/20) 12.5   8/20 1.03 4.2 1.8 12.8  2/21 1.06 4.0 2.1 12.7  7/21 1.19 4.8    9/21 1.14 5.0 2.1 11.3  4/22 0.97 4.0  12.8  9/22 0.97 4.3  10.8   Antiarrhythmics Date Reason stopped  amio 7/19    dofetilide          Records and Results Reviewed *  Past Medical History:  Diagnosis Date   Actinic keratosis 06/09/2019   L sup forehead at hairline - bx proven   AICD (automatic cardioverter/defibrillator) present 04/07/2017   Allergy    Anxiety    BPH (benign prostatic hypertrophy)    elevated PSA Dr. Lindell Noe Bx 2010   Cardiac arrest [facility])    a. out-of-hospital arrest 11/13/2012 - EF 40-45%, patent grafts on cath, received [employer] AICD.   Carotid disease, bilateral (HCC)    a. 0-39% by doppers.   Cataract    bil cataracts removed   CHF (congestive heart failure) (HCC)    Coronary artery disease    a. s/p MI/CABG 2005. b. s/p cath at time of VF arrest 10/2013 - grafts patent.   Cough    Diverticulosis    Elevated  LFTs    a. 10/2012 felt due to cardiac arrest - Hepatitis C Ab reactive from 11/13/2012>>Hep C RNA PCR negative 11/15/2012.    GERD (gastroesophageal reflux disease)    Hyperlipidemia    Hypertension    Internal hemorrhoids    Myocardial infarction (HCC)    2005 - CABG x 5 2005   RBBB    Tubular adenoma of colon    Ventricular bigeminy    a. Event monitor 01/2013: NSR with PVCs and occ bigeminy.    Past Surgical History:  Procedure Laterality Date   APPENDECTOMY     CARDIAC CATHETERIZATION  2007   with patent graft anatomy atretic left internal mammary  artery to the LAD which is nonobstructive. Will restart study June 08, 2007   CARDIOVERSION N/A 03/27/2018   Procedure: CARDIOVERSION;  Surgeon: Iran Ouch, MD;  Location: [facility] ORS;  Service: Cardiovascular;  Laterality: N/A;   CARDIOVERSION N/A 01/04/2019   Procedure: CARDIOVERSION  (CATH LAB);  Surgeon: Iran Ouch, MD;  Location: [facility] ORS;  Service: Cardiovascular;  Laterality: N/A;   CARDIOVERSION N/A 07/07/2019   Procedure: CARDIOVERSION;  Surgeon: Wendall Stade, MD;  Location: [facility] ENDOSCOPY;  Service: Cardiovascular;  Laterality: N/A;   CATARACT EXTRACTION W/PHACO Right 04/23/2016   Procedure: CATARACT EXTRACTION PHACO AND INTRAOCULAR LENS PLACEMENT (IOC);  Surgeon: [name], MD;  Location: [facility] ORS;  Service: Ophthalmology;  Laterality: Right;  [place] 1.09 AP% 19.3 CDE 13.38 Fluid pack lot # [id number]   CHOLECYSTECTOMY N/A 06/05/2017   Procedure: LAPAROSCOPIC CHOLECYSTECTOMY WITH INTRAOPERATIVE CHOLANGIOGRAM ERAS PATHWAY POSSIBLE NEEDLE CORE BIOPSY OF LIVER;  Surgeon: [name], MD;  Location: [facility] OR;  Service: General;  Laterality: N/A;  ERAS PATHWAY   COLONOSCOPY     CORONARY ARTERY BYPASS GRAFT  2005   EYE SURGERY     ICD GENERATOR CHANGEOUT N/A 04/07/2017   Procedure: ICD Generator Changeout;  Surgeon: Duke Salvia, MD;  Location[facility] INVASIVE CV LAB;  Service: Cardiovascular;  Laterality: N/A;   IMPLANTABLE CARDIOVERTER DEFIBRILLATOR IMPLANT N/A 10/29/2012   STJ single chamber ICD implanted by Dr Graciela Husbands for cardiac arrest    INGUINAL HERNIA REPAIR Bilateral 01/17/2015   Procedure: BILATERAL LAPAROSCOPIC INGUINAL HERNIA REPAIR WITH LEFT FEMORAL HERNIA REPAIR;  Surgeon: [name], MD;  Location: [facility] OR;  Service: General;  Laterality: Bilateral;   INSERTION OF MESH Bilateral 01/17/2015   Procedure: INSERTION OF MESH;  Surgeon: [name], MD;  [facility] OR;  Service: General;  Laterality: Bilateral;   LAPAROSCOPIC CHOLECYSTECTOMY  06/05/2017   LEAD INSERTION N/A 04/07/2017   Procedure: RA Lead Insertion;  Surgeon: Duke Salvia, MD;  Location: [facility] INVASIVE CV LAB;  Service: Cardiovascular;  Laterality: N/A;   LEAD REVISION/REPAIR N/A 04/08/2017   Procedure: Atrial Lead Revision/Repair;  Surgeon: Duke Salvia, MD;  Location: [facility] INVASIVE CV LAB;   Service: Cardiovascular;  Laterality: N/A;   LEFT HEART CATH AND CORONARY ANGIOGRAPHY Left 05/14/2019   Procedure: LEFT HEART CATH AND CORONARY ANGIOGRAPHY;  Surgeon: Iran Ouch, MD;  Location: [facility] INVASIVE CV LAB;  Service: Cardiovascular;  Laterality: Left;   LEFT HEART CATHETERIZATION WITH CORONARY/GRAFT ANGIOGRAM N/A 11/19/2012   Procedure: LEFT HEART CATHETERIZATION WITH Isabel Caprice;  Surgeon: [name], MD;  Location: [facility] CATH LAB;  Service: Cardiovascular;  Laterality: N/A;   LIVER BIOPSY N/A 06/05/2017   Procedure: SINGLE SITE LIVER BIOPSY ERAS PATHWAY;  Surgeon: [name], MD;  Location: [facility] OR;  Service: General;  Laterality: N/A;  ERAS  PATHWAY   LUMBAR FUSION  09/2007    Current Meds  Medication Sig   acetaminophen (TYLENOL) 500 MG tablet Take 500 mg by mouth at bedtime.   alfuzosin (UROXATRAL) 10 MG 24 hr tablet Take 10 mg by mouth every evening.    atorvastatin (LIPITOR) 20 MG tablet TAKE 1 TABLET BY MOUTH ONCE DAILY   benzonatate (TESSALON) 100 MG capsule Take 1-2 capsules (100-200 mg total) by mouth 3 (three) times daily as needed for cough.   betamethasone dipropionate 0.05 % cream Apply topically 2 (two) times daily.   cinacalcet (SENSIPAR) 30 MG tablet Take 1 tablet (30 mg total) by mouth 3 (three) times a week.   dofetilide (TIKOSYN) 250 MCG capsule TAKE ONE CAPSULE BY MOUTH TWICE A DAY   dutasteride (AVODART) 0.5 MG capsule Take 0.5 mg by mouth every evening.    ELIQUIS 5 MG TABS tablet TAKE 1 TABLET BY MOUTH TWICE (2) DAILY   ENTRESTO 24-26 MG TAKE 1 TABLET BY MOUTH TWICE A DAY   escitalopram (LEXAPRO) 5 MG tablet Take 5 mg by mouth daily.   linaclotide (LINZESS) 290 MCG CAPS capsule Take 1 capsule (290 mcg total) by mouth daily as needed (constipation).   losartan (COZAAR) 50 MG tablet TAKE 1 TABLET BY MOUTH ONCE A DAY   magnesium oxide (MAG-OX) 400 (241.3 Mg) MG tablet TAKE 1 TABLET BY MOUTH ONCE DAILY   metoprolol succinate (TOPROL-XL) 50 MG  24 hr tablet TAKE 1 TABLET BY MOUTH TWICE A DAY. TAKEWITH OR IMMEDIATELY FOLLOWING A MEAL   Multiple Vitamin (MULTIVITAMIN WITH MINERALS) TABS tablet Take 1 tablet by mouth every other day.   nitroGLYCERIN (NITROSTAT) 0.4 MG SL tablet DISSOLVE 1 TABLET UNDER TONGUE AS NEEDEDFOR CHEST PAIN. MAY REPEAT 5 MINUTES APART 3 TIMES IF NEEDED   Polyethyl Glycol-Propyl Glycol (SYSTANE OP) Place 1 drop into both eyes daily as needed (burning eyes).    triamcinolone cream (KENALOG) 0.1 % APPLY 1 APPLICATION TOPICALLY TWO TIMES DAILY AS NEEDED AVOIDING FACE, GROIN ANDARMPITS   vitamin B-12 (CYANOCOBALAMIN) 1000 MCG tablet Take 1,000 mcg by mouth daily.   Current Outpatient Medications on File Prior to Visit  Medication Sig Dispense Refill   acetaminophen (TYLENOL) 500 MG tablet Take 500 mg by mouth at bedtime.     alfuzosin (UROXATRAL) 10 MG 24 hr tablet Take 10 mg by mouth every evening.      atorvastatin (LIPITOR) 20 MG tablet TAKE 1 TABLET BY MOUTH ONCE DAILY 90 tablet 3   benzonatate (TESSALON) 100 MG capsule Take 1-2 capsules (100-200 mg total) by mouth 3 (three) times daily as needed for cough. 60 capsule 1   betamethasone dipropionate 0.05 % cream Apply topically 2 (two) times daily. 30 g 1   cinacalcet (SENSIPAR) 30 MG tablet Take 1 tablet (30 mg total) by mouth 3 (three) times a week. 40 tablet 3   dofetilide (TIKOSYN) 250 MCG capsule TAKE ONE CAPSULE BY MOUTH TWICE A DAY 180 capsule 3   dutasteride (AVODART) 0.5 MG capsule Take 0.5 mg by mouth every evening.      ELIQUIS 5 MG TABS tablet TAKE 1 TABLET BY MOUTH TWICE (2) DAILY 180 tablet 3   ENTRESTO 24-26 MG TAKE 1 TABLET BY MOUTH TWICE A DAY 180 tablet 3   escitalopram (LEXAPRO) 5 MG tablet Take 5 mg by mouth daily.     linaclotide (LINZESS) 290 MCG CAPS capsule Take 1 capsule (290 mcg total) by mouth daily as needed (constipation). 90 capsule 3  losartan (COZAAR) 50 MG tablet TAKE 1 TABLET BY MOUTH ONCE A DAY 90 tablet 3   magnesium oxide  (MAG-OX) 400 (241.3 Mg) MG tablet TAKE 1 TABLET BY MOUTH ONCE DAILY 30 tablet 6   metoprolol succinate (TOPROL-XL) 50 MG 24 hr tablet TAKE 1 TABLET BY MOUTH TWICE A DAY. TAKEWITH OR IMMEDIATELY FOLLOWING A MEAL 180 tablet 3   Multiple Vitamin (MULTIVITAMIN WITH MINERALS) TABS tablet Take 1 tablet by mouth every other day.     nitroGLYCERIN (NITROSTAT) 0.4 MG SL tablet DISSOLVE 1 TABLET UNDER TONGUE AS NEEDEDFOR CHEST PAIN. MAY REPEAT 5 MINUTES APART 3 TIMES IF NEEDED 25 tablet 3   Polyethyl Glycol-Propyl Glycol (SYSTANE OP) Place 1 drop into both eyes daily as needed (burning eyes).      triamcinolone cream (KENALOG) 0.1 % APPLY 1 APPLICATION TOPICALLY TWO TIMES DAILY AS NEEDED AVOIDING FACE, GROIN ANDARMPITS 80 g 0   vitamin B-12 (CYANOCOBALAMIN) 1000 MCG tablet Take 1,000 mcg by mouth daily.     escitalopram (LEXAPRO) 10 MG tablet Take 1 tablet (10 mg total) by mouth daily. (Patient not taking: Reported on 08/28/2021) 30 tablet 5   No current facility-administered medications on file prior to visit.     Allergies  Allergen Reactions   Ezetimibe Other (See Comments)    Pt was in coma for 6 days, numbness   Penicillins Other (See Comments)    BLISTERS BETWEEN FINGERS Did it involve swelling of the face/tongue/throat, SOB, or low BP? No Did it involve sudden or severe rash/hives, skin peeling, or any reaction on the inside of your mouth or nose? Yes Did you need to seek medical attention at a hospital or doctor's office? No When did it last happen?      60 years If all above answers are "NO", may proceed with cephalosporin use.    Pravastatin Sodium Other (See Comments)    Aches and pains in joints   Rosuvastatin Other (See Comments)    MYALGIAS   Niacin Anxiety and Other (See Comments)    "Makes me feel nervous, jittery"      Review of Systems negative except from HPI and PMH  Physical Exam BP 110/60 (BP Location: Left Arm, Patient Position: Sitting, Cuff Size: Normal)   Pulse  75   Ht 5\' 7"  (1.702 m)   Wt 138 lb (62.6 kg)   SpO2 97%   BMI 21.61 kg/m  Well developed and well nourished in no acute distress HENT normal Neck supple with JVP-flat Clear Device pocket well healed; without hematoma or erythema.  There is no tethering  Regular rate and rhythm, no  gallop No  murmur Abd-soft with active BS No Clubbing cyanosis   edema Skin-warm and dry A & Oriented  Grossly normal sensory and motor function  ECG sinus at 75 Interval 29/16/45 Frequent PVCs right bundle superior axis     ASSESSMENT & PLAN:    Atrial fibrillation/flutter-persistent   PVCs-right bundle branch superior axis --identified by his device by ventricular rates slower than paced rate of 70   Cardiomyopathy recurrent   History of aborted sudden cardiac death     Implantable defibrillator-St. Jude      Ischemic cardiomyopathy with prior CABG     Congestive Heart Failure chronic systolic  High Risk Medication Surveillance,Dofetilide   Right bundle branch block.  Anemia   Sinus bradycardia/chronotropic incompetence  LV function remains stable, we will continue Entresto 24/26 and metop 50 bid  No afib  continue dofetilide 250  bid  and Apixoban  5 bid  PVC persist, but as of last evaluation LV function stable, continue dofetilide and metop   Anemia worseing  needs further evaluation, will check retics Fe TIBC  to begin with                .  Sherryl Manges, MD  08/28/2021 9:04 AM     [facility] [address] [place] [address] [phone] (office) [phone] (fax) yes.

## 2021-08-31 ENCOUNTER — Telehealth: Payer: Self-pay | Admitting: *Deleted

## 2021-08-31 NOTE — Chronic Care Management (AMB) (Signed)
  Chronic Care Management   Note  08/31/2021 Name: TAHIR BLANK MRN: [id number] DOB: 1932/06/04  Camelia Phenes Hillock is a 87 y.o. year old male who is a primary care patient of Plotnikov, Evie Lacks, MD. I reached out to [name] by phone today in response to a referral sent by Mr. Zackrey Dyar Aye's PCP.  Mr. Neuser was given information about Chronic Care Management services today including:  CCM service includes personalized support from designated clinical staff supervised by his physician, including individualized plan of care and coordination with other care providers 24/7 contact phone numbers for assistance for urgent and routine care needs. Service will only be billed when office clinical staff spend 20 minutes or more in a month to coordinate care. Only one practitioner may furnish and bill the service in a calendar month. The patient may stop CCM services at any time (effective at the end of the month) by phone call to the office staff. The patient is responsible for co-pay (up to 20% after annual deductible is met) if co-pay is required by the individual health plan.   Patient agreed to services and verbal consent obtained.   Follow up plan: Telephone appointment with care management team member scheduled for:09/06/21  [facility]  Direct Dial: [phone]

## 2021-09-02 ENCOUNTER — Other Ambulatory Visit: Payer: Self-pay | Admitting: Internal Medicine

## 2021-09-06 ENCOUNTER — Ambulatory Visit (INDEPENDENT_AMBULATORY_CARE_PROVIDER_SITE_OTHER): Payer: PPO | Admitting: *Deleted

## 2021-09-06 DIAGNOSIS — I5032 Chronic diastolic (congestive) heart failure: Secondary | ICD-10-CM

## 2021-09-06 DIAGNOSIS — U099 Post covid-19 condition, unspecified: Secondary | ICD-10-CM

## 2021-09-06 DIAGNOSIS — I10 Essential (primary) hypertension: Secondary | ICD-10-CM

## 2021-09-06 DIAGNOSIS — G9332 Myalgic encephalomyelitis/chronic fatigue syndrome: Secondary | ICD-10-CM

## 2021-09-07 NOTE — Patient Instructions (Signed)
[facility] [place], it was nice talking with you today.    I look forward to talking to you again for an update on Tuesday, December 04, 2021 at 2:00 pm- please be listening out for my call that day.  I will call as close to 2:00 pm as possible.   If you need to cancel or re-schedule our telephone visit, please call [phone] and one of our care guides will be happy to assist you.   I look forward to hearing about your progress.   Please don't hesitate to contact me if I can be of assistance to you before our next scheduled telephone appointment.   [name], RN, BSN, [facility] RN Care Coordination- [place] [phone]: direct office [phone]: mobile   PATIENT GOALS:   Goals Addressed             This Visit's Progress    Patient Self-Care Activities   On track    Timeframe:  Long-Range Goal Priority:  Medium Start Date:          09/06/21                   Expected End Date:    09/06/22                   Patient will self administer medications as prescribed   Patient will attend all scheduled provider appointments  Patient will call pharmacy for medication refills  Patient will call provider office for new concerns or questions  Patient will continue to follow heart healthy, low salt, carbohydrate modified, low sugar diet Patient will continue to monitor and record blood pressures at home weekly Patient will continue to monitor and record weights at home weekly Patient will continue to stay active on a regular basis and will continue to pace activity as he recuperates from long-COVID  Patient will talk to his outpatient pharmacist of PCP about timing on next COVID booster, given his recent COVID infection with long-term effects Patient will make sure that he obtains the flu vaccine for 2022-23 flu season, if he has not already yet done so       Heart Failure Action Plan A heart failure action plan helps you understand  what to do when you have symptoms of heart failure. Your action plan is a color-coded plan that lists the symptoms to watch for and indicates what actions to take. If you have symptoms in the red zone, you need medical care right away. If you have symptoms in the yellow zone, you are having problems. If you have symptoms in the green zone, you are doing well. Follow the plan that was created by you and your health care provider. Review your plan each time you visit your health care provider. Red zone These signs and symptoms mean you should get medical help right away: You have trouble breathing when resting. You have a dry cough that is getting worse. You have swelling or pain in your legs or abdomen that is getting worse. You suddenly gain more than 2-3 lb (0.9-1.4 kg) in 24 hours, or more than 5 lb (2.3 kg) in a week. This amount may be more or less depending on your condition. You have trouble staying awake or you feel confused. You have chest pain. You do not have an appetite. You pass out. You have worsening sadness or depression. If you have any of these symptoms, call your local  emergency services (911 in the [place] right away. Do not drive yourself to the hospital. Yellow zone These signs and symptoms mean your condition may be getting worse and you should make some changes: You have trouble breathing when you are active, or you need to sleep with your head raised on extra pillows to help you breathe. You have swelling in your legs or abdomen. You gain 2-3 lb (0.9-1.4 kg) in 24 hours, or 5 lb (2.3 kg) in a week. This amount may be more or less depending on your condition. You get tired easily. You have trouble sleeping. You have a dry cough. If you have any of these symptoms: Contact your health care provider within the next day. Your health care provider may adjust your medicines. Green zone These signs mean you are doing well and can continue what you are doing: You do not  have shortness of breath. You have very little swelling or no new swelling. Your weight is stable (no gain or loss). You have a normal activity level. You do not have chest pain or any other new symptoms. Follow these instructions at home: Take over-the-counter and prescription medicines only as told by your health care provider. Weigh yourself daily. Your target weight is __________ lb (__________ kg). Call your health care provider if you gain more than __________ lb (__________ kg) in 24 hours, or more than __________ lb (__________ kg) in a week. Health care provider name: _____________________________________________________ Health care provider phone number: _____________________________________________________ Eat a heart-healthy diet. Work with a diet and nutrition specialist (dietitian) to create an eating plan that is best for you. Keep all follow-up visits. This is important. Where to find more information American Heart Association: www.heart.org Summary A heart failure action plan helps you understand what to do when you have symptoms of heart failure. Follow the action plan that was created by you and your health care provider. Get help right away if you have any symptoms in the red zone. This information is not intended to replace advice given to you by your health care provider. Make sure you discuss any questions you have with your health care provider. Document Revised: 06/26/2020 Document Reviewed: 06/26/2020 Elsevier Patient Education  2022 [employer].   Consent to CCM Services: Mr. Havens was given information about Chronic Care Management services including:  CCM service includes personalized support from designated clinical staff supervised by his physician, including individualized plan of care and coordination with other care providers 24/7 contact phone numbers for assistance for urgent and routine care needs. Service will only be billed when office clinical  staff spend 20 minutes or more in a month to coordinate care. Only one practitioner may furnish and bill the service in a calendar month. The patient may stop CCM services at any time (effective at the end of the month) by phone call to the office staff. The patient will be responsible for cost sharing (co-pay) of up to 20% of the service fee (after annual deductible is met).  Patient agreed to services and verbal consent obtained.   Patient verbalizes understanding of instructions provided today and agrees to view in MyChart Telephone follow up appointment with care management team member scheduled for:  Tuesday, December 04, 2021 at 2:00 pm The patient has been provided with contact information for the care management team and has been advised to call with any health related questions or concerns  [name], RN, BSN, [place] (  [phone]: direct office [phone]: mobile   CLINICAL CARE PLAN: Patient Care Plan: RN Care Manager Plan of Care     Problem Identified: Chronic Disease Management Needs   Priority: Medium     Long-Range Goal: Development of Plan of Care for long term chronic disease management   Start Date: 09/06/2021  Expected End Date: 09/06/2022  Priority: Medium  Note:   Current Barriers:  Chronic Disease Management support and education needs related to CHF, HTN, and post-Covid syndrome  RNCM Clinical Goal(s):  Patient will demonstrate ongoing health management independence HTN; CHF; post-Covid syndrome  through collaboration with RN Care manager, provider, and care team.   Interventions: 1:1 collaboration with primary care provider regarding development and update of comprehensive plan of care as evidenced by provider attestation and co-signature Inter-disciplinary care team collaboration (see longitudinal plan of care) Evaluation of current treatment plan related to  self management and  patient's adherence to plan as established by provider  Post-COVID syndrome:  (Status: New goal.) Provided education to patient to enhance basic understanding of COVID-19 as a viral disease, measures to prevent exposure, signs and symptoms, recommended vaccine schedule, when to contact provider Provided FDA vaccine guidelines Advised patient to discuss timing of next COVID booster vaccine with outpatient pharmacist or PCP provider Screening for signs and symptoms of depression Discussed effects, symptoms, and management of "long COVID"' Assessed social determinant of health barriers Confirmed patient is slowly recuperating from recent long-COVID- starting to feel better; starting to gain weight back-- reports lost 15 lbs; current weight reported at "133 lbs" Confirmed patient has slowly resumed his regular activity: reports he and spouse try to walk 5 miles per day when weather is nice; encouraged activity, but to pace self and not over-do; hoping to be able to resume attending gym as he did prior to [place], "soon" Confirmed appetite good, eating well Encouraged patient to follow up on status of flu vaccine for 2022-23 flu season, and to talk to providers/ Outpatient pharmacist about recommendations on getting flu vaccine and COVID vaccine at same time-- given his long-COVID effects  Hypertension: (Status: New goal.) Last practice recorded BP readings:  BP Readings from Last 3 Encounters:  08/28/21 110/60  07/26/21 110/60  07/26/21 134/60  Most recent eGFR/CrCl: No results found for: EGFR  No components found for: CRCL  Evaluation of current treatment plan related to hypertension self management and patient's adherence to plan as established by provider;   Reviewed prescribed diet low salt, low cholesterol, heart healthy Counseled on the importance of exercise goals with target of 150 minutes per week Discussed plans with patient for ongoing care management follow up and provided patient with  direct contact information for care management team; Provided education on prescribed diet as above;  Discussed complications of poorly controlled blood pressure such as heart disease, stroke, circulatory complications, vision complications, kidney impairment, sexual dysfunction;  Confirmed patient monitors blood pressures at home, reviewed recent blood pressures: reports consistent values between 101-125/50-70 Reviewed recent cardiology provider office visit 08/28/21 with patient: he verbalizes good understanding of post-visit instructions, confirms no changes to general plan of care Confirmed patient independently self-manages medications at home: denies concerns, questions; reports ongoing issues with periodically going into "donut hole;" he declines CCM Pharmacy referral at this time: states he does not think he would qualify for PAP-- but adds that he is becoming [facility], and plans to discuss medications with [facility] provider "next week;" will re-visit if necessary during future  outreaches- patient understands he can contact me should he wish for me to place [address] referral to explore PAP options Confirms patient monitors weight each week at home: currently trying to gain weight after losing with recent COVID: we discussed rationale / role of weight monitoring at home to monitor fluid retention- he verbalizes a good understanding of same: current weights: 133 lb Discussed/ provided verbal education re: signs/ symptoms yellow CHF zone along with corresponding action plan; will send printed education to [facility]: he denies clinical concerns around breathing status, lower extremity/ abdominal swelling Reviewed upcoming provider appointments with patient: 09/25/21- PCP; 10/25/21- endocrinology provider; confirmed patient aware, has plans at attend as scheduled  Patient Goals/Self-Care Activities: Patient will self administer medications as prescribed as evidenced by self report  during CCM RN CM outreach  Patient will attend all scheduled provider appointments as evidenced by clinician review of documented attendance to scheduled appointments and patient report Patient will call pharmacy for medication refills as evidenced by patient report and review of pharmacy fill history as appropriate Patient will call provider office for new concerns or questions as evidenced by review of documented incoming telephone call notes and patient report Patient will continue to follow heart healthy, low salt, carbohydrate modified, low sugar diet, as evidenced by patient reporting during CCM RN CM follow up outreach Patient will continue to monitor and record blood pressures at home weekly, as evidenced by review of same/ patient reporting during CCM RN CM follow up outreach Patient will continue to monitor and record weights at home weekly, as evidenced by review of same/ patient reporting during CCM RN CM follow up outreach  Patient will continue to stay active on a regular basis and will continue to pace activity as he recuperates from long-COVID as evidenced by patient reporting during CCM RN CM outreach Patient will talk to his outpatient pharmacist of PCP about timing on next COVID booster, given his recent COVID infection with long-term effects, as evidenced by patient reporting during CCM RN CM outreach Patient will make sure that he obtains the flu vaccine for 2022-23 flu season, if he has not already yet done so, as evidenced by patient reporting during CCM RN CM outreach

## 2021-09-07 NOTE — Chronic Care Management (AMB) (Addendum)
Chronic Care Management   CCM RN Visit Note  09/07/2021 Name: [name] Christensen MRN: [id number] DOB: Feb 10, 1932  Subjective: [name] Christensen is a 87 y.o. year old male who is a primary care patient of Plotnikov, Georgina Quint, MD. The care management team was consulted for assistance with disease management and care coordination needs.    Engaged with patient by telephone for initial visit in response to provider referral for case management and/or care coordination services.   Consent to Services:  The patient was given information about Chronic Care Management services, agreed to services, and gave verbal consent 08/31/21 prior to initiation of services.  Please see initial visit note for detailed documentation.  Patient agreed to services and verbal consent obtained.   Assessment: Review of patient past medical history, allergies, medications, health status, including review of consultants reports, laboratory and other test data, was performed as part of comprehensive evaluation and provision of chronic care management services.   SDOH (Social Determinants of Health) assessments and interventions performed:  SDOH Interventions    Flowsheet Row Most Recent Value  SDOH Interventions   Food Insecurity Interventions Intervention Not Indicated  Financial Strain Interventions Intervention Not Indicated  Housing Interventions Intervention Not Indicated  Transportation Interventions Intervention Not Indicated  [Patient drives self]       CCM Care Plan Allergies  Allergen Reactions   Ezetimibe Other (See Comments)    Pt was in coma for 6 days, numbness   Penicillins Other (See Comments)    BLISTERS BETWEEN FINGERS Did it involve swelling of the face/tongue/throat, SOB, or low BP? No Did it involve sudden or severe rash/hives, skin peeling, or any reaction on the inside of your mouth or nose? Yes Did you need to seek medical attention at a hospital or doctor's office? No When did it last  happen?      60 years If all above answers are "NO", may proceed with cephalosporin use.    Pravastatin Sodium Other (See Comments)    Aches and pains in joints   Rosuvastatin Other (See Comments)    MYALGIAS   Niacin Anxiety and Other (See Comments)    "Makes me feel nervous, jittery"   Outpatient Encounter Medications as of 09/06/2021  Medication Sig   acetaminophen (TYLENOL) 500 MG tablet Take 500 mg by mouth at bedtime.   alfuzosin (UROXATRAL) 10 MG 24 hr tablet Take 10 mg by mouth every evening.    atorvastatin (LIPITOR) 20 MG tablet TAKE 1 TABLET BY MOUTH ONCE DAILY   benzonatate (TESSALON) 100 MG capsule Take 1-2 capsules (100-200 mg total) by mouth 3 (three) times daily as needed for cough.   betamethasone dipropionate 0.05 % cream Apply topically 2 (two) times daily.   cinacalcet (SENSIPAR) 30 MG tablet Take 1 tablet (30 mg total) by mouth 3 (three) times a week.   dofetilide (TIKOSYN) 250 MCG capsule TAKE ONE CAPSULE BY MOUTH TWICE A DAY   dutasteride (AVODART) 0.5 MG capsule Take 0.5 mg by mouth every evening.    ELIQUIS 5 MG TABS tablet TAKE 1 TABLET BY MOUTH TWICE (2) DAILY   ENTRESTO 24-26 MG TAKE 1 TABLET BY MOUTH TWICE A DAY   escitalopram (LEXAPRO) 10 MG tablet Take 1 tablet (10 mg total) by mouth daily. (Patient not taking: Reported on 08/28/2021)   escitalopram (LEXAPRO) 5 MG tablet Take 5 mg by mouth daily.   linaclotide (LINZESS) 290 MCG CAPS capsule Take 1 capsule (290 mcg total) by mouth daily as needed (constipation).  magnesium oxide (MAG-OX) 400 (241.3 Mg) MG tablet TAKE 1 TABLET BY MOUTH ONCE DAILY   metoprolol succinate (TOPROL-XL) 50 MG 24 hr tablet TAKE 1 TABLET BY MOUTH TWICE A DAY. TAKEWITH OR IMMEDIATELY FOLLOWING A MEAL   Multiple Vitamin (MULTIVITAMIN WITH MINERALS) TABS tablet Take 1 tablet by mouth every other day.   nitroGLYCERIN (NITROSTAT) 0.4 MG SL tablet DISSOLVE 1 TABLET UNDER TONGUE AS NEEDEDFOR CHEST PAIN. MAY REPEAT 5 MINUTES APART 3 TIMES IF  NEEDED   Polyethyl Glycol-Propyl Glycol (SYSTANE OP) Place 1 drop into both eyes daily as needed (burning eyes).    triamcinolone cream (KENALOG) 0.1 % APPLY 1 APPLICATION TOPICALLY TWO TIMES DAILY AS NEEDED AVOIDING FACE, GROIN ANDARMPITS   vitamin B-12 (CYANOCOBALAMIN) 1000 MCG tablet Take 1,000 mcg by mouth daily.   No facility-administered encounter medications on file as of 09/06/2021.   Patient Active Problem List   Diagnosis Date Noted   Osteoporosis 07/26/2021   Post-COVID chronic fatigue 07/26/2021   Diarrhea 07/03/2021   COVID-19 06/10/2021   Low blood pressure 06/10/2021   Aortic atherosclerosis (HCC) 04/02/2021   Apathy 04/02/2021   Memory problem 03/01/2021   Chronic renal insufficiency, stage 3 (moderate) (HCC) 01/01/2021   Persistent atrial fibrillation (HCC) 07/05/2019   Ischemic cardiomyopathy    Unstable angina (HCC)    Upper respiratory infection 09/23/2018   Hypocalciuric hypercalcemia 09/16/2018   Fall 08/17/2018   Facial pain 08/17/2018   Hematuria 05/20/2018   Paroxysmal atrial fibrillation (HCC)    Bronchiectasis (HCC) assoc with possible MAI 03/11/2018   Dyspnea on exertion 03/10/2018   Chronic diastolic CHF (congestive heart failure) (HCC) 03/09/2018   Atrial flutter (HCC) 03/09/2018   PVC's (premature ventricular contractions) 03/09/2018   Arthralgia 01/29/2018   Dry skin 12/22/2017   Carotid stenosis, asymptomatic, bilateral 12/22/2017   Chills with fever 11/24/2017   Dysuria 11/24/2017   Anxiety disorder 11/24/2017   Hepatic cirrhosis (HCC) 06/05/2017   Chronic calculous cholecystitis 06/05/2017   Weight loss 04/24/2017   Sinus node dysfunction (HCC) 04/07/2017   Chronic cholecystitis s/p lap cholecystectomy 06/05/2017 02/24/2017   Pleural effusion 02/24/2017   Dyslipidemia 08/05/2016   Fatigue 04/09/2016   Constipation 08/09/2015   Cough in adult 11/30/2014   LBP (low back pain) 08/30/2013   Chest pain, atypical 07/21/2013   Lung  infiltrate 07/19/2013   Ventricular vs Supraventricular bigeminy 04/10/2013   Implantable cardioverter-defibrillator (ICD) in situ 11/13/2012   CAP (community acquired pneumonia) 11/20/2012   Sudden cardiac death (HCC) 2012-11-14   CORONARY ARTERY BYPASS GRAFT, HX OF 10/11/2008   Insomnia 12/07/2007   Essential hypertension 09/01/2007   CAD (coronary artery disease) 09/01/2007   GERD 09/01/2007   BPH (benign prostatic hyperplasia) 09/01/2007   Conditions to be addressed/monitored:  CHF, HTN, and long-COVID syndrome  Care Plan : RN Care Manager Plan of Care  Updates made by Michaela Corner, RN since 09/07/2021 12:00 AM     Problem: Chronic Disease Management Needs   Priority: Medium     Long-Range Goal: Development of Plan of Care for long term chronic disease management   Start Date: 09/06/2021  Expected End Date: 09/06/2022  Priority: Medium  Note:   Current Barriers:  Chronic Disease Management support and education needs related to CHF, HTN, and post-Covid syndrome  RNCM Clinical Goal(s):  Patient will demonstrate ongoing health management independence HTN; CHF; post-Covid syndrome  through collaboration with RN Care manager, provider, and care team.   Interventions: 1:1 collaboration with primary care provider regarding  development and update of comprehensive plan of care as evidenced by provider attestation and co-signature Inter-disciplinary care team collaboration (see longitudinal plan of care) Evaluation of current treatment plan related to  self management and patient's adherence to plan as established by provider  Post-COVID syndrome:  (Status: New goal.) Provided education to patient to enhance basic understanding of COVID-19 as a viral disease, measures to prevent exposure, signs and symptoms, recommended vaccine schedule, when to contact provider Provided FDA vaccine guidelines Advised patient to discuss timing of next COVID booster vaccine with outpatient  pharmacist or PCP provider Screening for signs and symptoms of depression Discussed effects, symptoms, and management of "long COVID"' Assessed social determinant of health barriers Confirmed patient is slowly recuperating from recent long-COVID- starting to feel better; starting to gain weight back-- reports lost 15 lbs; current weight reported at "133 lbs" Confirmed patient has slowly resumed his regular activity: reports he and spouse try to walk 5 miles per day when weather is nice; encouraged activity, but to pace self and not over-do; hoping to be able to resume attending gym as he did prior to COVID, "soon" Confirmed appetite good, eating well Encouraged patient to follow up on status of flu vaccine for 2022-23 flu season, and to talk to providers/ Outpatient pharmacist about recommendations on getting flu vaccine and COVID vaccine at same time-- given his long-COVID effects  Hypertension: (Status: New goal.) Last practice recorded BP readings:  BP Readings from Last 3 Encounters:  08/28/21 110/60  07/26/21 110/60  07/26/21 134/60  Most recent eGFR/CrCl: No results found for: EGFR  No components found for: CRCL  Evaluation of current treatment plan related to hypertension self management and patient's adherence to plan as established by provider;   Reviewed prescribed diet low salt, low cholesterol, heart healthy Counseled on the importance of exercise goals with target of 150 minutes per week Discussed plans with patient for ongoing care management follow up and provided patient with direct contact information for care management team; Provided education on prescribed diet as above;  Discussed complications of poorly controlled blood pressure such as heart disease, stroke, circulatory complications, vision complications, kidney impairment, sexual dysfunction;  Confirmed patient monitors blood pressures at home, reviewed recent blood pressures: reports consistent values between  101-125/50-70 Reviewed recent cardiology provider office visit 08/28/21 with patient: he verbalizes good understanding of post-visit instructions, confirms no changes to general plan of care Confirmed patient independently self-manages medications at home: denies concerns, questions; reports ongoing issues with periodically going into "donut hole;" he declines CCM Pharmacy referral at this time: states he does not think he would qualify for PAP-- but adds that he is becoming established with [facility] benefits, and plans to discuss medications with [facility] provider "next week;" will re-visit if necessary during future outreaches- patient understands he can contact me should he wish for me to place CCM Pharmacy referral to explore PAP options Confirms patient monitors weight each week at home: currently trying to gain weight after losing with recent COVID: we discussed rationale / role of weight monitoring at home to monitor fluid retention- he verbalizes a good understanding of same: current weights: 133 lb Discussed/ provided verbal education re: signs/ symptoms yellow CHF zone along with corresponding action plan; will send printed education to [facility]: he denies clinical concerns around breathing status, lower extremity/ abdominal swelling Reviewed upcoming provider appointments with patient: 09/25/21- PCP; 10/25/21- endocrinology; confirmed patient aware, has plans to attend as scheduled  Patient Goals/Self-Care Activities: Patient will self administer  medications as prescribed as evidenced by self report during CCM RN CM outreach  Patient will attend all scheduled provider appointments as evidenced by clinician review of documented attendance to scheduled appointments and patient report Patient will call pharmacy for medication refills as evidenced by patient report and review of pharmacy fill history as appropriate Patient will call provider office for new concerns or questions as evidenced by review of  documented incoming telephone call notes and patient report Patient will continue to follow heart healthy, low salt, carbohydrate modified, low sugar diet, as evidenced by patient reporting during CCM RN CM follow up outreach Patient will continue to monitor and record blood pressures at home weekly, as evidenced by review of same/ patient reporting during CCM RN CM follow up outreach Patient will continue to monitor and record weights at home weekly, as evidenced by review of same/ patient reporting during CCM RN CM follow up outreach  Patient will continue to stay active on a regular basis and will continue to pace activity as he recuperates from long-COVID as evidenced by patient reporting during CCM RN CM outreach Patient will talk to his outpatient pharmacist of PCP about timing on next COVID booster, given his recent COVID infection with long-term effects, as evidenced by patient reporting during CCM RN CM outreach Patient will make sure that he obtains the flu vaccine for 2022-23 flu season, if he has not already yet done so, as evidenced by patient reporting during CCM RN CM outreach      Plan: Telephone follow up appointment with care management team member scheduled for:  Tuesday, December 04, 2021 at 2:00 pm The patient has been provided with contact information for the care management team and has been advised to call with any health related questions or concerns  Caryl Pina, RN, BSN, CCRN Alumnus CCM Clinic RN Care Coordination- [facility] [place] [phone]: direct office [phone]: mobile   Medical screening examination/treatment/procedure(s) were performed by non-physician practitioner and as supervising physician I was immediately available for consultation/collaboration.  I agree with above. Jacinta Shoe, MD

## 2021-09-10 ENCOUNTER — Other Ambulatory Visit: Payer: Self-pay | Admitting: Internal Medicine

## 2021-09-24 DIAGNOSIS — I5032 Chronic diastolic (congestive) heart failure: Secondary | ICD-10-CM | POA: Diagnosis not present

## 2021-09-24 DIAGNOSIS — I10 Essential (primary) hypertension: Secondary | ICD-10-CM

## 2021-09-25 ENCOUNTER — Encounter: Payer: Self-pay | Admitting: Internal Medicine

## 2021-09-25 ENCOUNTER — Telehealth: Payer: Self-pay | Admitting: Internal Medicine

## 2021-09-25 ENCOUNTER — Other Ambulatory Visit: Payer: Self-pay

## 2021-09-25 ENCOUNTER — Ambulatory Visit (INDEPENDENT_AMBULATORY_CARE_PROVIDER_SITE_OTHER): Payer: PPO | Admitting: Internal Medicine

## 2021-09-25 VITALS — BP 140/70 | HR 82 | Temp 98.2°F | Ht 67.0 in | Wt 135.4 lb

## 2021-09-25 DIAGNOSIS — I10 Essential (primary) hypertension: Secondary | ICD-10-CM

## 2021-09-25 DIAGNOSIS — I4892 Unspecified atrial flutter: Secondary | ICD-10-CM | POA: Diagnosis not present

## 2021-09-25 DIAGNOSIS — F419 Anxiety disorder, unspecified: Secondary | ICD-10-CM | POA: Diagnosis not present

## 2021-09-25 DIAGNOSIS — I480 Paroxysmal atrial fibrillation: Secondary | ICD-10-CM

## 2021-09-25 DIAGNOSIS — R413 Other amnesia: Secondary | ICD-10-CM

## 2021-09-25 DIAGNOSIS — R634 Abnormal weight loss: Secondary | ICD-10-CM | POA: Diagnosis not present

## 2021-09-25 DIAGNOSIS — I5032 Chronic diastolic (congestive) heart failure: Secondary | ICD-10-CM | POA: Diagnosis not present

## 2021-09-25 DIAGNOSIS — Z23 Encounter for immunization: Secondary | ICD-10-CM

## 2021-09-25 DIAGNOSIS — D649 Anemia, unspecified: Secondary | ICD-10-CM | POA: Diagnosis not present

## 2021-09-25 LAB — CBC WITH DIFFERENTIAL/PLATELET
Basophils Absolute: 0 10*3/uL (ref 0.0–0.1)
Basophils Relative: 0.3 % (ref 0.0–3.0)
Eosinophils Absolute: 0.2 10*3/uL (ref 0.0–0.7)
Eosinophils Relative: 2.9 % (ref 0.0–5.0)
HCT: 34.2 % — ABNORMAL LOW (ref 39.0–52.0)
Hemoglobin: 11.1 g/dL — ABNORMAL LOW (ref 13.0–17.0)
Lymphocytes Relative: 23.3 % (ref 12.0–46.0)
Lymphs Abs: 1.5 10*3/uL (ref 0.7–4.0)
MCHC: 32.5 g/dL (ref 30.0–36.0)
MCV: 92.7 fl (ref 78.0–100.0)
Monocytes Absolute: 0.4 10*3/uL (ref 0.1–1.0)
Monocytes Relative: 6.5 % (ref 3.0–12.0)
Neutro Abs: 4.2 10*3/uL (ref 1.4–7.7)
Neutrophils Relative %: 67 % (ref 43.0–77.0)
Platelets: 149 10*3/uL — ABNORMAL LOW (ref 150.0–400.0)
RBC: 3.69 Mil/uL — ABNORMAL LOW (ref 4.22–5.81)
RDW: 12.8 % (ref 11.5–15.5)
WBC: 6.3 10*3/uL (ref 4.0–10.5)

## 2021-09-25 LAB — COMPREHENSIVE METABOLIC PANEL
ALT: 9 U/L (ref 0–53)
AST: 18 U/L (ref 0–37)
Albumin: 3.7 g/dL (ref 3.5–5.2)
Alkaline Phosphatase: 81 U/L (ref 39–117)
BUN: 22 mg/dL (ref 6–23)
CO2: 31 mEq/L (ref 19–32)
Calcium: 10.3 mg/dL (ref 8.4–10.5)
Chloride: 100 mEq/L (ref 96–112)
Creatinine, Ser: 1.12 mg/dL (ref 0.40–1.50)
GFR: 59.3 mL/min — ABNORMAL LOW (ref >60.00)
Glucose, Bld: 93 mg/dL (ref 70–99)
Potassium: 4.7 mEq/L (ref 3.5–5.1)
Sodium: 134 mEq/L — ABNORMAL LOW (ref 135–145)
Total Bilirubin: 0.5 mg/dL (ref 0.2–1.2)
Total Protein: 7.5 g/dL (ref 6.0–8.3)

## 2021-09-25 MED ORDER — BENZONATATE 100 MG PO CAPS: 100.0000 mg | ORAL_CAPSULE | Freq: Three times a day (TID) | ORAL | 1 refills | Status: DC | PRN

## 2021-09-25 NOTE — Assessment & Plan Note (Signed)
Wt Readings from Last 3 Encounters:  09/25/21 135 lb 6.4 oz (61.4 kg)  08/28/21 138 lb (62.6 kg)  07/26/21 134 lb 3.2 oz (60.9 kg)  Appetite is better

## 2021-09-25 NOTE — Assessment & Plan Note (Signed)
  BP Readings from Last 3 Encounters:  09/25/21 140/70  08/28/21 110/60  07/26/21 110/60

## 2021-09-25 NOTE — Telephone Encounter (Signed)
Pt. Wife has called to give an update on pt. States she usually comes with him for his appt. But was not feeling well today. States pt. Is doing better, watching tv, and has more interest in things. Her only concern is his bouts of anxiety.    Caller says thank you to [place] and provider for care and service.

## 2021-09-25 NOTE — Assessment & Plan Note (Signed)
Check CBC 

## 2021-09-25 NOTE — Progress Notes (Signed)
Subjective:  Patient ID: [name] Christensen, male    DOB: 1932/02/28  Age: 87 y.o. MRN: [id number]  CC: Follow-up (2 month f/u- Flu shot)   HPI [name] presents for post-COVID fatigue, CAD, memory problems. Wife fell on 10/28 - ?subdural hemartoma  Outpatient Medications Prior to Visit  Medication Sig Dispense Refill  . acetaminophen (TYLENOL) 500 MG tablet Take 500 mg by mouth at bedtime.    [name] alfuzosin (UROXATRAL) 10 MG 24 hr tablet Take 10 mg by mouth every evening.     [name] atorvastatin (LIPITOR) 20 MG tablet TAKE 1 TABLET BY MOUTH ONCE DAILY 90 tablet 3  . betamethasone dipropionate 0.05 % cream Apply topically 2 (two) times daily. 30 g 1  . cinacalcet (SENSIPAR) 30 MG tablet Take 1 tablet (30 mg total) by mouth 3 (three) times a week. 40 tablet 3  . dofetilide (TIKOSYN) 250 MCG capsule TAKE ONE CAPSULE BY MOUTH TWICE A DAY 180 capsule 2  . dutasteride (AVODART) 0.5 MG capsule Take 0.5 mg by mouth every evening.     [name] ELIQUIS 5 MG TABS tablet TAKE 1 TABLET BY MOUTH TWICE (2) DAILY 180 tablet 3  . ENTRESTO 24-26 MG TAKE 1 TABLET BY MOUTH TWICE A DAY 180 tablet 3  . escitalopram (LEXAPRO) 10 MG tablet Take 1 tablet (10 mg total) by mouth daily. 30 tablet 5  . linaclotide (LINZESS) 290 MCG CAPS capsule Take 1 capsule (290 mcg total) by mouth daily as needed (constipation). 90 capsule 3  . Multiple Vitamin (MULTIVITAMIN WITH MINERALS) TABS tablet Take 1 tablet by mouth every other day.    . nitroGLYCERIN (NITROSTAT) 0.4 MG SL tablet DISSOLVE 1 TABLET UNDER TONGUE AS NEEDEDFOR CHEST PAIN. MAY REPEAT 5 MINUTES APART 3 TIMES IF NEEDED 25 tablet 3  . Polyethyl Glycol-Propyl Glycol (SYSTANE OP) Place 1 drop into both eyes daily as needed (burning eyes).     . triamcinolone cream (KENALOG) 0.1 % APPLY 1 APPLICATION TOPICALLY TWO TIMES DAILY AS NEEDED AVOIDING FACE, GROIN ANDARMPITS 80 g 0  . vitamin B-12 (CYANOCOBALAMIN) 1000 MCG tablet Take 1,000 mcg by mouth daily.    . benzonatate  (TESSALON) 100 MG capsule Take 1-2 capsules (100-200 mg total) by mouth 3 (three) times daily as needed for cough. 60 capsule 1  . magnesium oxide (MAG-OX) 400 (241.3 Mg) MG tablet TAKE 1 TABLET BY MOUTH ONCE DAILY (Patient not taking: Reported on 09/25/2021) 30 tablet 6  . metoprolol succinate (TOPROL-XL) 50 MG 24 hr tablet TAKE 1 TABLET BY MOUTH TWICE A DAY. TAKEWITH OR IMMEDIATELY FOLLOWING A MEAL (Patient not taking: Reported on 09/25/2021) 180 tablet 3  . escitalopram (LEXAPRO) 5 MG tablet Take 5 mg by mouth daily.     No facility-administered medications prior to visit.    ROS: Review of Systems  Constitutional:  Positive for fatigue. Negative for appetite change, fever and unexpected weight change.  HENT:  Negative for congestion, nosebleeds, sneezing, sore throat and trouble swallowing.   Eyes:  Negative for itching and visual disturbance.  Respiratory:  Negative for cough.   Cardiovascular:  Negative for chest pain, palpitations and leg swelling.  Gastrointestinal:  Negative for abdominal distention, blood in stool, diarrhea and nausea.  Genitourinary:  Negative for frequency and hematuria.  Musculoskeletal:  Negative for back pain, gait problem, joint swelling and neck pain.  Skin:  Negative for rash.  Neurological:  Negative for dizziness, tremors, speech difficulty and weakness.  Psychiatric/Behavioral:  Positive for decreased concentration.  Negative for agitation, dysphoric mood, sleep disturbance and suicidal ideas. The patient is nervous/anxious.    Objective:  BP 140/70 (BP Location: Left Arm)   Pulse 82   Temp 98.2 F (36.8 C) (Oral)   Ht 5\' 7"  (1.702 m)   Wt 135 lb 6.4 oz (61.4 kg)   SpO2 97%   BMI 21.21 kg/m   BP Readings from Last 3 Encounters:  09/25/21 140/70  08/28/21 110/60  07/26/21 110/60    Wt Readings from Last 3 Encounters:  09/25/21 135 lb 6.4 oz (61.4 kg)  08/28/21 138 lb (62.6 kg)  07/26/21 134 lb 3.2 oz (60.9 kg)    Physical  Exam Constitutional:      General: He is not in acute distress.    Appearance: He is well-developed.     Comments: NAD  Eyes:     Conjunctiva/sclera: Conjunctivae normal.     Pupils: Pupils are equal, round, and reactive to light.  Neck:     Thyroid: No thyromegaly.     Vascular: No JVD.  Cardiovascular:     Rate and Rhythm: Normal rate and regular rhythm.     Heart sounds: Normal heart sounds. No murmur heard.   No friction rub. No gallop.  Pulmonary:     Effort: Pulmonary effort is normal. No respiratory distress.     Breath sounds: Normal breath sounds. No wheezing or rales.  Chest:     Chest wall: No tenderness.  Abdominal:     General: Bowel sounds are normal. There is no distension.     Palpations: Abdomen is soft. There is no mass.     Tenderness: There is no abdominal tenderness. There is no guarding or rebound.  Musculoskeletal:        General: No tenderness. Normal range of motion.     Cervical back: Normal range of motion.  Lymphadenopathy:     Cervical: No cervical adenopathy.  Skin:    General: Skin is warm and dry.     Findings: No rash.  Neurological:     Mental Status: He is alert and oriented to person, place, and time.     Cranial Nerves: No cranial nerve deficit.     Motor: No abnormal muscle tone.     Coordination: Coordination normal.     Gait: Gait normal.     Deep Tendon Reflexes: Reflexes are normal and symmetric.  Psychiatric:        Behavior: Behavior normal.        Thought Content: Thought content normal.        Judgment: Judgment normal.    Lab Results  Component Value Date   WBC 5.9 07/26/2021   HGB 10.8 (L) 07/26/2021   HCT 32.4 (L) 07/26/2021   PLT 140.0 (L) 07/26/2021   GLUCOSE 89 07/26/2021   CHOL 107 12/30/2019   TRIG 102.0 12/30/2019   HDL 46.80 12/30/2019   LDLCALC 39 12/30/2019   ALT 12 01/19/2021   AST 24 01/19/2021   NA 135 07/26/2021   K 4.3 07/26/2021   CL 101 07/26/2021   CREATININE 0.97 07/26/2021   BUN 23  07/26/2021   CO2 29 07/26/2021   TSH 2.21 07/26/2021   PSA 12.45 (H) 02/06/2016   INR 1.1 04/02/2017   HGBA1C 6.1 12/30/2019    DG Chest 2 View  Result Date: 07/25/2021 CLINICAL DATA:  Shortness of breath, chronic systolic heart failure, increased shortness of breath with exertion, history coronary artery disease post MI and CABG, hypertension, CHF  EXAM: CHEST - 2 VIEW COMPARISON:  01/24/2021 FINDINGS: LEFT subclavian ICD with leads projecting at RIGHT atrium and RIGHT ventricle. Normal heart size post CABG. Mediastinal contours and pulmonary vascularity normal. Atherosclerotic calcification aorta. Emphysematous and bronchitic changes consistent with COPD. Chronic interstitial lung disease and mid to lower lungs bilaterally. No acute infiltrate, pleural effusion, or pneumothorax. Bones demineralized. IMPRESSION: Post CABG and ICD. COPD changes with basilar chronic interstitial lung disease. No acute abnormalities. Aortic Atherosclerosis (ICD10-I70.0) and Emphysema (ICD10-J43.9). Electronically Signed   By: Lavonia Dana M.D.   On: 07/25/2021 17:57    Assessment & Plan:   Problem List Items Addressed This Visit     Anemia    Check CBC      Anxiety disorder    Anxiety and depressed mood.  Probable underlying dementia. Overall better mood-wise. Wife fell on 10/28 - ?subdural hemartoma - stressed      Chronic diastolic CHF (congestive heart failure) (HCC)    Recent ECHO was ok      Essential hypertension     BP Readings from Last 3 Encounters:  09/25/21 140/70  08/28/21 110/60  07/26/21 110/60         Memory problem    Stable or better Wife fell on 10/28 - ?subdural hemartoma - stressed      Paroxysmal atrial fibrillation (HCC)    Continue on Eliquis, Toprol, Tikosyn      Weight loss    Wt Readings from Last 3 Encounters:  09/25/21 135 lb 6.4 oz (61.4 kg)  08/28/21 138 lb (62.6 kg)  07/26/21 134 lb 3.2 oz (60.9 kg)  Appetite is better       Other Visit Diagnoses      Needs flu shot    -  Primary   Relevant Orders   Flu Vaccine QUAD High Dose(Fluad) (Completed)         Meds ordered this encounter  Medications  . benzonatate (TESSALON) 100 MG capsule    Sig: Take 1-2 capsules (100-200 mg total) by mouth 3 (three) times daily as needed for cough.    Dispense:  60 capsule    Refill:  1      Follow-up: Return in about 3 months (around 12/26/2021) for a follow-up visit.  Walker Kehr, MD

## 2021-09-25 NOTE — Telephone Encounter (Signed)
Noted. Thanks.

## 2021-09-25 NOTE — Assessment & Plan Note (Signed)
Stable or better Wife fell on 10/28 - ?subdural hemartoma - stressed

## 2021-09-25 NOTE — Assessment & Plan Note (Addendum)
Anxiety and depressed mood.  Probable underlying dementia. Overall better mood-wise. Wife fell on 10/28 - ?subdural hemartoma - stressed

## 2021-09-25 NOTE — Assessment & Plan Note (Signed)
Continue on Eliquis, Toprol, Tikosyn

## 2021-09-25 NOTE — Assessment & Plan Note (Signed)
Recent ECHO was ok

## 2021-09-26 LAB — IRON,TIBC AND FERRITIN PANEL
%SAT: 26 % (calc) (ref 20–48)
Ferritin: 109 ng/mL (ref 24–380)
Iron: 70 ug/dL (ref 50–180)
TIBC: 273 mcg/dL (calc) (ref 250–425)

## 2021-09-27 ENCOUNTER — Other Ambulatory Visit: Payer: Self-pay | Admitting: Internal Medicine

## 2021-10-03 ENCOUNTER — Ambulatory Visit (INDEPENDENT_AMBULATORY_CARE_PROVIDER_SITE_OTHER): Payer: PPO

## 2021-10-03 DIAGNOSIS — I255 Ischemic cardiomyopathy: Secondary | ICD-10-CM

## 2021-10-03 LAB — CUP PACEART REMOTE DEVICE CHECK
Battery Remaining Longevity: 40 mo
Battery Remaining Percentage: 45 %
Battery Voltage: 2.92 V
Brady Statistic AP VP Percent: 1 %
Brady Statistic AP VS Percent: 51 %
Brady Statistic AS VP Percent: 8 %
Brady Statistic AS VS Percent: 39 %
Brady Statistic RA Percent Paced: 46 %
Brady Statistic RV Percent Paced: 8.1 %
Date Time Interrogation Session: 20221109020015
HighPow Impedance: 73 Ohm
HighPow Impedance: 73 Ohm
Implantable Lead Implant Date: 20131218
Implantable Lead Implant Date: 20180514
Implantable Lead Location: 753859
Implantable Lead Location: 753860
Implantable Lead Model: 5076
Implantable Pulse Generator Implant Date: 20180514
Lead Channel Impedance Value: 400 Ohm
Lead Channel Impedance Value: 460 Ohm
Lead Channel Pacing Threshold Amplitude: 0.5 V
Lead Channel Pacing Threshold Amplitude: 1.25 V
Lead Channel Pacing Threshold Pulse Width: 0.5 ms
Lead Channel Pacing Threshold Pulse Width: 0.5 ms
Lead Channel Sensing Intrinsic Amplitude: 5 mV
Lead Channel Sensing Intrinsic Amplitude: 5 mV
Lead Channel Setting Pacing Amplitude: 2 V
Lead Channel Setting Pacing Amplitude: 2.5 V
Lead Channel Setting Pacing Pulse Width: 0.5 ms
Lead Channel Setting Sensing Sensitivity: 0.5 mV
Pulse Gen Serial Number: 1245762

## 2021-10-12 NOTE — Progress Notes (Signed)
Remote ICD transmission.   

## 2021-10-13 ENCOUNTER — Other Ambulatory Visit: Payer: Self-pay | Admitting: Internal Medicine

## 2021-10-22 ENCOUNTER — Other Ambulatory Visit: Payer: Self-pay | Admitting: Internal Medicine

## 2021-10-25 ENCOUNTER — Ambulatory Visit: Payer: PPO | Admitting: Endocrinology

## 2021-11-05 ENCOUNTER — Other Ambulatory Visit: Payer: Self-pay | Admitting: Internal Medicine

## 2021-11-07 ENCOUNTER — Ambulatory Visit: Payer: PPO | Admitting: Emergency Medicine

## 2021-11-29 ENCOUNTER — Other Ambulatory Visit: Payer: Self-pay

## 2021-11-29 ENCOUNTER — Ambulatory Visit (INDEPENDENT_AMBULATORY_CARE_PROVIDER_SITE_OTHER): Payer: PPO

## 2021-11-29 ENCOUNTER — Ambulatory Visit (INDEPENDENT_AMBULATORY_CARE_PROVIDER_SITE_OTHER): Payer: PPO | Admitting: Internal Medicine

## 2021-11-29 ENCOUNTER — Encounter: Payer: Self-pay | Admitting: Internal Medicine

## 2021-11-29 DIAGNOSIS — I5032 Chronic diastolic (congestive) heart failure: Secondary | ICD-10-CM | POA: Diagnosis not present

## 2021-11-29 DIAGNOSIS — R5382 Chronic fatigue, unspecified: Secondary | ICD-10-CM

## 2021-11-29 DIAGNOSIS — R059 Cough, unspecified: Secondary | ICD-10-CM | POA: Diagnosis not present

## 2021-11-29 DIAGNOSIS — J449 Chronic obstructive pulmonary disease, unspecified: Secondary | ICD-10-CM | POA: Diagnosis not present

## 2021-11-29 DIAGNOSIS — J439 Emphysema, unspecified: Secondary | ICD-10-CM | POA: Diagnosis not present

## 2021-11-29 LAB — CBC WITH DIFFERENTIAL/PLATELET
Basophils Absolute: 0 10*3/uL (ref 0.0–0.1)
Basophils Relative: 0.3 % (ref 0.0–3.0)
Eosinophils Absolute: 0.2 10*3/uL (ref 0.0–0.7)
Eosinophils Relative: 2.8 % (ref 0.0–5.0)
HCT: 35.4 % — ABNORMAL LOW (ref 39.0–52.0)
Hemoglobin: 11.7 g/dL — ABNORMAL LOW (ref 13.0–17.0)
Lymphocytes Relative: 23.2 % (ref 12.0–46.0)
Lymphs Abs: 1.5 10*3/uL (ref 0.7–4.0)
MCHC: 33.1 g/dL (ref 30.0–36.0)
MCV: 91.3 fl (ref 78.0–100.0)
Monocytes Absolute: 0.4 10*3/uL (ref 0.1–1.0)
Monocytes Relative: 6.6 % (ref 3.0–12.0)
Neutro Abs: 4.4 10*3/uL (ref 1.4–7.7)
Neutrophils Relative %: 67.1 % (ref 43.0–77.0)
Platelets: 165 10*3/uL (ref 150.0–400.0)
RBC: 3.88 Mil/uL — ABNORMAL LOW (ref 4.22–5.81)
RDW: 13.3 % (ref 11.5–15.5)
WBC: 6.6 10*3/uL (ref 4.0–10.5)

## 2021-11-29 LAB — COMPREHENSIVE METABOLIC PANEL
ALT: 10 U/L (ref 0–53)
AST: 21 U/L (ref 0–37)
Albumin: 3.7 g/dL (ref 3.5–5.2)
Alkaline Phosphatase: 83 U/L (ref 39–117)
BUN: 23 mg/dL (ref 6–23)
CO2: 31 mEq/L (ref 19–32)
Calcium: 10.5 mg/dL (ref 8.4–10.5)
Chloride: 100 mEq/L (ref 96–112)
Creatinine, Ser: 1.1 mg/dL (ref 0.40–1.50)
GFR: 60.52 mL/min (ref >60.00)
Glucose, Bld: 93 mg/dL (ref 70–99)
Potassium: 4.4 mEq/L (ref 3.5–5.1)
Sodium: 135 mEq/L (ref 135–145)
Total Bilirubin: 0.5 mg/dL (ref 0.2–1.2)
Total Protein: 7.8 g/dL (ref 6.0–8.3)

## 2021-11-29 LAB — TSH: TSH: 2.26 u[IU]/mL (ref 0.35–5.50)

## 2021-11-29 LAB — BRAIN NATRIURETIC PEPTIDE: Pro B Natriuretic peptide (BNP): 68 pg/mL (ref 0.0–100.0)

## 2021-11-29 MED ORDER — CEFDINIR 300 MG PO CAPS: 300.0000 mg | ORAL_CAPSULE | Freq: Two times a day (BID) | ORAL | 0 refills | Status: DC

## 2021-11-29 MED ORDER — HYDROCODONE BIT-HOMATROP MBR 5-1.5 MG/5ML PO SOLN: 5.0000 mL | Freq: Four times a day (QID) | ORAL | 0 refills | Status: DC | PRN

## 2021-11-29 MED ORDER — METHYLPREDNISOLONE 4 MG PO TBPK: ORAL_TABLET | ORAL | 0 refills | Status: DC

## 2021-11-29 NOTE — Assessment & Plan Note (Signed)
Post-COVID Check CXR, CBC, TSH

## 2021-11-29 NOTE — Assessment & Plan Note (Signed)
Check BNP CXR

## 2021-11-29 NOTE — Progress Notes (Signed)
Subjective:  Patient ID: [name] Christensen, male    DOB: August 14, 1932  Age: 88 y.o. MRN: [id number]  CC: Cough (Post covid.. pt states he still have cough w/phlegm (thick & white))   HPI [name] presents for post-COVID coughing spells 20-30 min at the time, brown and thick sputum x 4-5 months. Worse now. C/o DOE now. C/o fatigue...  Outpatient Medications Prior to Visit  Medication Sig Dispense Refill   acetaminophen (TYLENOL) 500 MG tablet Take 500 mg by mouth at bedtime.     alfuzosin (UROXATRAL) 10 MG 24 hr tablet Take 10 mg by mouth every evening.      atorvastatin (LIPITOR) 20 MG tablet TAKE 1 TABLET BY MOUTH ONCE DAILY 90 tablet 3   betamethasone dipropionate 0.05 % cream APPLY TOPICALLY TO THE AFFECTED AREA TWOTIMES DAILY 30 g 1   cinacalcet (SENSIPAR) 30 MG tablet Take 1 tablet (30 mg total) by mouth 3 (three) times a week. 40 tablet 3   dofetilide (TIKOSYN) 250 MCG capsule TAKE ONE CAPSULE BY MOUTH TWICE A DAY 180 capsule 2   dutasteride (AVODART) 0.5 MG capsule Take 0.5 mg by mouth every evening.      ELIQUIS 5 MG TABS tablet TAKE 1 TABLET BY MOUTH TWICE (2) DAILY 180 tablet 3   ENTRESTO 24-26 MG TAKE 1 TABLET BY MOUTH TWICE A DAY 180 tablet 3   escitalopram (LEXAPRO) 10 MG tablet Take 1 tablet (10 mg total) by mouth daily. 30 tablet 5   linaclotide (LINZESS) 290 MCG CAPS capsule Take 1 capsule (290 mcg total) by mouth daily as needed (constipation). 90 capsule 3   magnesium oxide (MAG-OX) 400 (241.3 Mg) MG tablet TAKE 1 TABLET BY MOUTH ONCE DAILY 30 tablet 6   metoprolol succinate (TOPROL-XL) 50 MG 24 hr tablet TAKE 1 TABLET BY MOUTH TWICE A DAY. TAKEWITH OR IMMEDIATELY FOLLOWING A MEAL 180 tablet 3   Multiple Vitamin (MULTIVITAMIN WITH MINERALS) TABS tablet Take 1 tablet by mouth every other day.     nitroGLYCERIN (NITROSTAT) 0.4 MG SL tablet DISSOLVE 1 TABLET UNDER TONGUE AS NEEDEDFOR CHEST PAIN. MAY REPEAT 5 MINUTES APART 3 TIMES IF NEEDED 25 tablet 3   Polyethyl  Glycol-Propyl Glycol (SYSTANE OP) Place 1 drop into both eyes daily as needed (burning eyes).      triamcinolone cream (KENALOG) 0.1 % APPLY 1 APPLICATION TOPICALLY TWO TIMES DAILY AS NEEDED AVOIDING FACE, GROIN ANDARMPITS 80 g 0   vitamin B-12 (CYANOCOBALAMIN) 1000 MCG tablet Take 1,000 mcg by mouth daily.     benzonatate (TESSALON) 100 MG capsule Take 1-2 capsules (100-200 mg total) by mouth 3 (three) times daily as needed for cough. 60 capsule 1   No facility-administered medications prior to visit.    ROS: Review of Systems  Constitutional:  Negative for appetite change, fatigue and unexpected weight change.  HENT:  Positive for congestion, postnasal drip and rhinorrhea. Negative for nosebleeds, sneezing, sore throat and trouble swallowing.   Eyes:  Negative for itching and visual disturbance.  Respiratory:  Positive for cough and shortness of breath.   Cardiovascular:  Negative for chest pain, palpitations and leg swelling.  Gastrointestinal:  Negative for abdominal distention, blood in stool, diarrhea and nausea.  Genitourinary:  Negative for frequency and hematuria.  Musculoskeletal:  Negative for back pain, gait problem, joint swelling and neck pain.  Skin:  Negative for rash.  Neurological:  Positive for weakness. Negative for dizziness, tremors and speech difficulty.  Psychiatric/Behavioral:  Negative for agitation,  dysphoric mood, sleep disturbance and suicidal ideas. The patient is not nervous/anxious.    Objective:  BP 138/78 (BP Location: Left Arm)    Pulse 85    Temp 98.1 F (36.7 C) (Oral)    Ht 5\' 7"  (1.702 m)    Wt 135 lb 9.6 oz (61.5 kg)    SpO2 96%    BMI 21.24 kg/m   BP Readings from Last 3 Encounters:  11/29/21 138/78  09/25/21 140/70  08/28/21 110/60    Wt Readings from Last 3 Encounters:  11/29/21 135 lb 9.6 oz (61.5 kg)  09/25/21 135 lb 6.4 oz (61.4 kg)  08/28/21 138 lb (62.6 kg)    Physical Exam Constitutional:      General: He is not in acute  distress.    Appearance: Normal appearance. He is well-developed.     Comments: NAD  Eyes:     Conjunctiva/sclera: Conjunctivae normal.     Pupils: Pupils are equal, round, and reactive to light.  Neck:     Thyroid: No thyromegaly.     Vascular: No JVD.  Cardiovascular:     Rate and Rhythm: Normal rate and regular rhythm.     Heart sounds: Normal heart sounds. No murmur heard.   No friction rub. No gallop.  Pulmonary:     Effort: Pulmonary effort is normal. No respiratory distress.     Breath sounds: Normal breath sounds. No wheezing or rales.  Chest:     Chest wall: No tenderness.  Abdominal:     General: Bowel sounds are normal. There is no distension.     Palpations: Abdomen is soft. There is no mass.     Tenderness: There is no abdominal tenderness. There is no guarding or rebound.  Musculoskeletal:        General: No tenderness. Normal range of motion.     Cervical back: Normal range of motion.  Lymphadenopathy:     Cervical: No cervical adenopathy.  Skin:    General: Skin is warm and dry.     Findings: No rash.  Neurological:     Mental Status: He is alert and oriented to person, place, and time.     Cranial Nerves: No cranial nerve deficit.     Motor: No abnormal muscle tone.     Coordination: Coordination normal.     Gait: Gait normal.     Deep Tendon Reflexes: Reflexes are normal and symmetric.  Psychiatric:        Behavior: Behavior normal.        Thought Content: Thought content normal.        Judgment: Judgment normal.    Lab Results  Component Value Date   WBC 6.3 09/25/2021   HGB 11.1 (L) 09/25/2021   HCT 34.2 (L) 09/25/2021   PLT 149.0 (L) 09/25/2021   GLUCOSE 93 09/25/2021   CHOL 107 12/30/2019   TRIG 102.0 12/30/2019   HDL 46.80 12/30/2019   LDLCALC 39 12/30/2019   ALT 9 09/25/2021   AST 18 09/25/2021   NA 134 (L) 09/25/2021   K 4.7 09/25/2021   CL 100 09/25/2021   CREATININE 1.12 09/25/2021   BUN 22 09/25/2021   CO2 31 09/25/2021   TSH  2.21 07/26/2021   PSA 12.45 (H) 02/06/2016   INR 1.1 04/02/2017   HGBA1C 6.1 12/30/2019    DG Chest 2 View  Result Date: 07/25/2021 CLINICAL DATA:  Shortness of breath, chronic systolic heart failure, increased shortness of breath with exertion, history coronary artery  disease post MI and CABG, hypertension, CHF EXAM: CHEST - 2 VIEW COMPARISON:  01/24/2021 FINDINGS: LEFT subclavian ICD with leads projecting at RIGHT atrium and RIGHT ventricle. Normal heart size post CABG. Mediastinal contours and pulmonary vascularity normal. Atherosclerotic calcification aorta. Emphysematous and bronchitic changes consistent with COPD. Chronic interstitial lung disease and mid to lower lungs bilaterally. No acute infiltrate, pleural effusion, or pneumothorax. Bones demineralized. IMPRESSION: Post CABG and ICD. COPD changes with basilar chronic interstitial lung disease. No acute abnormalities. Aortic Atherosclerosis (ICD10-I70.0) and Emphysema (ICD10-J43.9). Electronically Signed   By: Lavonia Dana M.D.   On: 07/25/2021 17:57    Assessment & Plan:   Problem List Items Addressed This Visit     Chronic diastolic CHF (congestive heart failure) (HCC)    Check BNP CXR      Relevant Orders   CBC with Differential/Platelet   Comprehensive metabolic panel   TSH   B Nat Peptide   DG Chest 2 View   Cough in adult    Worse: post-COVID coughing spells 20-30 min at the time, brown and thick sputum x 4-5 months. ?etiology CXR BNP, other labs Hycodan Omnicef po Medrol pack      Relevant Orders   B Nat Peptide   DG Chest 2 View   Fatigue    Post-COVID Check CXR, CBC, TSH      Relevant Orders   CBC with Differential/Platelet   Comprehensive metabolic panel   TSH      Meds ordered this encounter  Medications   HYDROcodone bit-homatropine (HYCODAN) 5-1.5 MG/5ML syrup    Sig: Take 5 mLs by mouth every 6 (six) hours as needed for cough.    Dispense:  240 mL    Refill:  0   cefdinir (OMNICEF) 300  MG capsule    Sig: Take 1 capsule (300 mg total) by mouth 2 (two) times daily.    Dispense:  20 capsule    Refill:  0   methylPREDNISolone (MEDROL DOSEPAK) 4 MG TBPK tablet    Sig: As directed    Dispense:  21 tablet    Refill:  0      Follow-up: Return in about 2 weeks (around 12/13/2021) for a follow-up visit.  Walker Kehr, MD

## 2021-11-29 NOTE — Assessment & Plan Note (Signed)
Worse: post-COVID coughing spells 20-30 min at the time, brown and thick sputum x 4-5 months. ?etiology CXR BNP, other labs Hycodan Omnicef po Medrol pack

## 2021-12-04 ENCOUNTER — Ambulatory Visit (INDEPENDENT_AMBULATORY_CARE_PROVIDER_SITE_OTHER): Payer: PPO | Admitting: *Deleted

## 2021-12-04 DIAGNOSIS — G9332 Myalgic encephalomyelitis/chronic fatigue syndrome: Secondary | ICD-10-CM

## 2021-12-04 DIAGNOSIS — I10 Essential (primary) hypertension: Secondary | ICD-10-CM

## 2021-12-04 DIAGNOSIS — I5032 Chronic diastolic (congestive) heart failure: Secondary | ICD-10-CM

## 2021-12-04 DIAGNOSIS — U099 Post covid-19 condition, unspecified: Secondary | ICD-10-CM

## 2021-12-04 NOTE — Chronic Care Management (AMB) (Addendum)
Chronic Care Management   CCM RN Visit Note  12/04/2021 Name: [name] Christensen MRN: [id number] DOB: 1932/11/11  Subjective: [name] Christensen is a 88 y.o. year old male who is a primary care patient of Plotnikov, Georgina Quint, MD. The care management team was consulted for assistance with disease management and care coordination needs.    Engaged with patient by telephone for follow up visit in response to provider referral for case management and/or care coordination services.   Consent to Services:  The patient was given information about Chronic Care Management services, agreed to services, and gave verbal consent prior to initiation of services.  Please see initial visit note for detailed documentation.  Patient agreed to services and verbal consent obtained.   Assessment: Review of patient past medical history, allergies, medications, health status, including review of consultants reports, laboratory and other test data, was performed as part of comprehensive evaluation and provision of chronic care management services.   SDOH (Social Determinants of Health) assessments and interventions performed:  SDOH Interventions    Flowsheet Row Most Recent Value  SDOH Interventions   Food Insecurity Interventions Intervention Not Indicated  [denies food insecurity]  Stress Interventions Other (Comment)  [reports has discussed with PCP/ taking medication as advised,  wife has had recent serious injury but is improving]  Transportation Interventions Intervention Not Indicated  [Patient continues to drive self]       CCM Care Plan  Allergies  Allergen Reactions   Ezetimibe Other (See Comments)    Pt was in coma for 6 days, numbness   Penicillins Other (See Comments)    BLISTERS BETWEEN FINGERS Did it involve swelling of the face/tongue/throat, SOB, or low BP? No Did it involve sudden or severe rash/hives, skin peeling, or any reaction on the inside of your mouth or nose? Yes Did you need to  seek medical attention at a hospital or doctor's office? No When did it last happen?      60 years If all above answers are NO, may proceed with cephalosporin use.    Pravastatin Sodium Other (See Comments)    Aches and pains in joints   Rosuvastatin Other (See Comments)    MYALGIAS   Niacin Anxiety and Other (See Comments)    "Makes me feel nervous, jittery"   Outpatient Encounter Medications as of 12/04/2021  Medication Sig   acetaminophen (TYLENOL) 500 MG tablet Take 500 mg by mouth at bedtime.   alfuzosin (UROXATRAL) 10 MG 24 hr tablet Take 10 mg by mouth every evening.    atorvastatin (LIPITOR) 20 MG tablet TAKE 1 TABLET BY MOUTH ONCE DAILY   betamethasone dipropionate 0.05 % cream APPLY TOPICALLY TO THE AFFECTED AREA TWOTIMES DAILY   cefdinir (OMNICEF) 300 MG capsule Take 1 capsule (300 mg total) by mouth 2 (two) times daily.   cinacalcet (SENSIPAR) 30 MG tablet Take 1 tablet (30 mg total) by mouth 3 (three) times a week.   dofetilide (TIKOSYN) 250 MCG capsule TAKE ONE CAPSULE BY MOUTH TWICE A DAY   dutasteride (AVODART) 0.5 MG capsule Take 0.5 mg by mouth every evening.    ELIQUIS 5 MG TABS tablet TAKE 1 TABLET BY MOUTH TWICE (2) DAILY   ENTRESTO 24-26 MG TAKE 1 TABLET BY MOUTH TWICE A DAY   escitalopram (LEXAPRO) 10 MG tablet Take 1 tablet (10 mg total) by mouth daily.   HYDROcodone bit-homatropine (HYCODAN) 5-1.5 MG/5ML syrup Take 5 mLs by mouth every 6 (six) hours as needed for cough.  linaclotide (LINZESS) 290 MCG CAPS capsule Take 1 capsule (290 mcg total) by mouth daily as needed (constipation).   magnesium oxide (MAG-OX) 400 (241.3 Mg) MG tablet TAKE 1 TABLET BY MOUTH ONCE DAILY   methylPREDNISolone (MEDROL DOSEPAK) 4 MG TBPK tablet As directed   metoprolol succinate (TOPROL-XL) 50 MG 24 hr tablet TAKE 1 TABLET BY MOUTH TWICE A DAY. TAKEWITH OR IMMEDIATELY FOLLOWING A MEAL   Multiple Vitamin (MULTIVITAMIN WITH MINERALS) TABS tablet Take 1 tablet by mouth every other day.    nitroGLYCERIN (NITROSTAT) 0.4 MG SL tablet DISSOLVE 1 TABLET UNDER TONGUE AS NEEDEDFOR CHEST PAIN. MAY REPEAT 5 MINUTES APART 3 TIMES IF NEEDED   Polyethyl Glycol-Propyl Glycol (SYSTANE OP) Place 1 drop into both eyes daily as needed (burning eyes).    triamcinolone cream (KENALOG) 0.1 % APPLY 1 APPLICATION TOPICALLY TWO TIMES DAILY AS NEEDED AVOIDING FACE, GROIN ANDARMPITS   vitamin B-12 (CYANOCOBALAMIN) 1000 MCG tablet Take 1,000 mcg by mouth daily.   No facility-administered encounter medications on file as of 12/04/2021.   Patient Active Problem List   Diagnosis Date Noted   Anemia 09/25/2021   Osteoporosis 07/26/2021   Post-COVID chronic fatigue 07/26/2021   Diarrhea 07/03/2021   COVID-19 06/10/2021   Low blood pressure 06/10/2021   Aortic atherosclerosis (HCC) 04/02/2021   Apathy 04/02/2021   Memory problem 03/01/2021   Chronic renal insufficiency, stage 3 (moderate) (HCC) 01/01/2021   Persistent atrial fibrillation (HCC) 07/05/2019   Ischemic cardiomyopathy    Unstable angina (HCC)    Upper respiratory infection 09/23/2018   Hypocalciuric hypercalcemia 09/16/2018   Fall 08/17/2018   Facial pain 08/17/2018   Hematuria 05/20/2018   Paroxysmal atrial fibrillation (HCC)    Bronchiectasis (HCC) assoc with possible MAI 03/11/2018   Dyspnea on exertion 03/10/2018   Chronic diastolic CHF (congestive heart failure) (HCC) 03/09/2018   Atrial flutter (HCC) 03/09/2018   PVC's (premature ventricular contractions) 03/09/2018   Arthralgia 01/29/2018   Dry skin 12/22/2017   Carotid stenosis, asymptomatic, bilateral 12/22/2017   Chills with fever 11/24/2017   Dysuria 11/24/2017   Anxiety disorder 11/24/2017   Hepatic cirrhosis (HCC) 06/05/2017   Chronic calculous cholecystitis 06/05/2017   Weight loss 04/24/2017   Sinus node dysfunction (HCC) 04/07/2017   Chronic cholecystitis s/p lap cholecystectomy 06/05/2017 02/24/2017   Pleural effusion 02/24/2017   Dyslipidemia 08/05/2016    Fatigue 04/09/2016   Constipation 08/09/2015   Cough in adult 11/30/2014   LBP (low back pain) 08/30/2013   Chest pain, atypical 07/21/2013   Lung infiltrate 07/19/2013   Ventricular vs Supraventricular bigeminy 04/19/2013   Implantable cardioverter-defibrillator (ICD) in situ 11/17/2012   CAP (community acquired pneumonia) 2012/11/14   Sudden cardiac death (HCC) November 08, 2012   CORONARY ARTERY BYPASS GRAFT, HX OF 10/11/2008   Insomnia 12/07/2007   Essential hypertension 09/01/2007   CAD (coronary artery disease) 09/01/2007   GERD 09/01/2007   BPH (benign prostatic hyperplasia) 09/01/2007   Conditions to be addressed/monitored:  CHF, HTN, and post-COVID  Care Plan : RN Care Manager Plan of Care  Updates made by Michaela Corner, RN since 12/04/2021 12:00 AM     Problem: Chronic Disease Management Needs   Priority: Medium     Long-Range Goal: Development of Plan of Care for long term chronic disease management   Start Date: 09/06/2021  Expected End Date: 09/06/2022  Priority: Medium  Note:   Current Barriers:  Chronic Disease Management support and education needs related to CHF, HTN, and post-Covid syndrome 12/04/21: Spouse had  recent head injury requiring surgery (November 2022)- patient has been under stress related to wife's injury: reports getting slowly better as wife recuperates; reports recuperating "well" and getting better "every day"  RNCM Clinical Goal(s):  Patient will demonstrate ongoing health management independence HTN; CHF; post-Covid syndrome  through collaboration with RN Care manager, provider, and care team.   Interventions: 1:1 collaboration with primary care provider regarding development and update of comprehensive plan of care as evidenced by provider attestation and co-signature Inter-disciplinary care team collaboration (see longitudinal plan of care) Evaluation of current treatment plan related to  self management and patient's adherence to plan as  established by provider Confirmed patient obtained flu vaccine for 2022-23 flu/ winter season (09/25/21)  Post-COVID syndrome:  (Status: Goal on Track (progressing): YES.)  Long-Term goal Confirmed patient discussed timing of next COVID booster vaccine with PCP provider- patient reports he was advised to hold off on obtaining future COVID vaccines Screening for signs and symptoms of depression Discussed effects, symptoms, and management of "long COVID"' Assessed social determinant of health barriers Confirmed patient continues slowly recuperating from recent long-COVID- starting to feel better; starting to gain weight back-- reports current weight maintained at "133 lbs" (no change from last outreach) Discussed recent development of severe cough/ reviewed recent PCP office visit 11/29/21: confirms he is taking prescribed medications (prednisone/ antibiotic, cough medicine) as prescribed- states "slowly recovering;" reports "good days/ bad days;" we reviewed patient's lab work and chest x-ray results and I read to patient follow up messages sent through MyChart regarding results of all Patient reports tolerating activity "okay;" states he had to take a break from normal work-out sessions/ walking activity due to recent pneumonia, as well as for tending to his wife as she recuperates from her recent head injury: reports he did go to gym yesterday and did a modified, shorter work out; tolerated "pretty good;" encouraged patient to pace activities and not over-do, given recent diagnosis of pneumonia; encouraged him to remain as active as possible, but to take care to not over-do- he verbalizes understanding/ agreement  Hypertension: (Status: Goal on Track (progressing): YES.) Long-Term goal Last practice recorded BP readings:  BP Readings from Last 3 Encounters:  11/29/21 138/78  09/25/21 140/70  08/28/21 110/60  Most recent eGFR/CrCl: No results found for: EGFR  No components found for:  CRCL  Evaluation of current treatment plan related to hypertension self management and patient's adherence to plan as established by provider;   Reviewed prescribed diet low salt, low cholesterol, heart healthy Discussed plans with patient for ongoing care management follow up and provided patient with direct contact information for care management team; Discussed complications of poorly controlled blood pressure such as heart disease, stroke, circulatory complications, vision complications, kidney impairment, sexual dysfunction;  Confirmed patient occasionally monitors blood pressures at home, reports "all have been okay;" does not have specific values for our review today; he reports not checking blood pressures quite as often as he has tended to his wife during her recuperation Confirms is still not taking blood pressure medications; denies medication concerns today Reinforced previously provided verbal education re: signs/ symptoms yellow CHF zone along with corresponding action plan; today, he denies clinical concerns around breathing status, lower extremity/ abdominal swelling and sounds to be in no distress throughout phone call today Reviewed upcoming provider appointments with patient: 12/18/21- PCP; 12/26/21- PCP; confirmed patient aware, has plans at attend as scheduled  Patient Goals/Self-Care Activities: As evidenced by review of EHR, collaboration with care team, and  patient reporting during CCM RN CM outreach,  Patient Robin will: Take medications as prescribed   Attend all scheduled provider appointments  Call pharmacy for medication refills  Call provider office for new concerns or questions  Continue to follow heart healthy, low salt, carbohydrate modified, low sugar diet Continue to monitor and record blood pressures at home weekly Continue to monitor and record weights at home weekly Continue to stay active on a regular basis and will continue to pace activity as you  recuperates from long-COVID and newly diagnosed pneumonia     Plan: Telephone follow up appointment with care management team member scheduled for:  Friday, December 28, 2021 at 2:15 pm The patient has been provided with contact information for the care management team and has been advised to call with any health related questions or concerns  Caryl Pina, RN, BSN, CCRN [employer]umnus [facility] RN Care Coordination- [facility] [place] [phone]: direct office     Medical screening examination/treatment/procedure(s) were performed by non-physician practitioner and as supervising physician I was immediately available for consultation/collaboration.  I agree with above. Jacinta Shoe, MD

## 2021-12-04 NOTE — Patient Instructions (Signed)
Visit [place], thank you for taking time to talk with me today. Please don't hesitate to contact me if I can be of assistance to you before our next scheduled telephone appointment.  Below are the goals we discussed today:  Patient Self-Care Activities: Patient [place] will: Take medications as prescribed   Attend all scheduled provider appointments  Call pharmacy for medication refills  Call provider office for new concerns or questions  Continue to follow heart healthy, low salt, carbohydrate modified, low sugar diet Continue to monitor and record blood pressures at home weekly Continue to monitor and record weights at home weekly Continue to stay active on a regular basis and will continue to pace activity as you recuperates from long-COVID and newly diagnosed pneumonia   Our next scheduled telephone follow up visit/ appointment with care management team member is scheduled on:  Friday, December 28, 2021 at 2:15 pm  If you need to cancel or re-schedule our visit, please call [phone] and our care guide team will be happy to assist you.   I look forward to hearing about your progress.   [name], RN, BSN, [place] [phone]: direct office  If you are experiencing a Mental Health or [place] or need someone to talk to, please  call the Suicide and Crisis Lifeline: 988 call the [place] National Suicide Prevention Lifeline: 805-599-9667 or TTY: 979 799 9104 TTY 361-797-4604) to talk to a trained counselor call 1-800-273-TALK (toll free, 24 hour hotline) go to [facility] [place] [phone]) call 911   Patient verbalizes understanding of instructions provided today and agrees to view in [place] Pneumonia, Adult Pneumonia is an infection of the lungs. It causes irritation and swelling in the airways of the  lungs. Mucus and fluid may also build up inside the airways. This may cause coughing and trouble breathing. One type of pneumonia can happen while you are in a hospital. A different type can happen when you are not in a hospital (community-acquired pneumonia). What are the causes? This condition is caused by germs (viruses, bacteria, or fungi). Some types of germs can spread from person to person. Pneumonia is not thought to spread from person to person. What increases the risk? You are more likely to develop this condition if: You have a long-term (chronic) disease, such as: Disease of the lungs. This may be chronic obstructive pulmonary disease (COPD) or asthma. Heart failure. Cystic fibrosis. Diabetes. Kidney disease. Sickle cell disease. HIV. You have other health problems, such as: Your body's defense system (immune system) is weak. A condition that may cause you to breathe in fluids from your mouth and nose. You had your spleen taken out. You do not take good care of your teeth and mouth (poor dental hygiene). You use or have used tobacco products. You travel where the germs that cause this illness are common. You are near certain animals or the places they live. You are older than 86 years of age. What are the signs or symptoms? Symptoms of this condition include: A cough. A fever. Sweating or chills. Chest pain, often when you breathe deeply or cough. Breathing problems, such as: Fast breathing. Trouble breathing. Shortness of breath. Feeling tired (fatigued). Muscle aches. How is this treated? Treatment for this condition depends on many things, such as: The cause of your illness. Your medicines. Your other health problems. Most adults can be treated at  home. Sometimes, treatment must happen in a hospital. Treatment may include medicines to kill germs. Medicines may depend on which germ caused your illness. Very bad pneumonia is rare. If you get it, you may: Have  a machine to help you breathe. Have fluid taken away from around your lungs. Follow these instructions at home: Medicines Take over-the-counter and prescription medicines only as told by your doctor. Take cough medicine only if you are losing sleep. Cough medicine can keep your body from taking mucus away from your lungs. If you were prescribed an antibiotic medicine, take it as told by your doctor. Do not stop taking the antibiotic even if you start to feel better. Lifestyle   Do not drink alcohol. Do not use any products that contain nicotine or tobacco, such as cigarettes, e-cigarettes, and chewing tobacco. If you need help quitting, ask your doctor. Eat a healthy diet. This includes a lot of vegetables, fruits, whole grains, low-fat dairy products, and low-fat (lean) protein. General instructions  Rest a lot. Sleep for at least 8 hours each night. Sleep with your head and neck raised. Put a few pillows under your head or sleep in a reclining chair. Return to your normal activities as told by your doctor. Ask your doctor what activities are safe for you. Drink enough fluid to keep your pee (urine) pale yellow. If your throat is sore, rinse your mouth often with salt water. To make salt water, dissolve -1 tsp (3-6 g) of salt in 1 cup (237 mL) of warm water. Keep all follow-up visits as told by your doctor. This is important. How is this prevented? You can lower your risk of pneumonia by: Getting the pneumonia shot (vaccine). These shots have different types and schedules. Ask your doctor what works best for you. Think about getting this shot if: You are older than 86 years of age. You are 53-90 years of age and: You are being treated for cancer. You have long-term lung disease. You have other problems that affect your body's defense system. Ask your doctor if you have one of these. Getting your flu shot every year. Ask your doctor which type of shot is best for you. Going to the  dentist as often as told. Washing your hands often with soap and water for at least 20 seconds. If you cannot use soap and water, use hand sanitizer. Contact a doctor if: You have a fever. You lose sleep because your cough medicine does not help. Get help right away if: You are short of breath and this gets worse. You have more chest pain. Your sickness gets worse. This is very serious if: You are an older adult. Your body's defense system is weak. You cough up blood. These symptoms may be an emergency. Do not wait to see if the symptoms will go away. Get medical help right away. Call your local emergency services (911 in the [place] Do not drive yourself to the hospital. Summary Pneumonia is an infection of the lungs. Community-acquired pneumonia affects people who have not been in the hospital. Certain germs can cause this infection. This condition may be treated with medicines that kill germs. For very bad pneumonia, you may need a hospital stay and treatment to help with breathing. This information is not intended to replace advice given to you by your health care provider. Make sure you discuss any questions you have with your health care provider. Document Revised: 08/24/2019 Document Reviewed: 08/24/2019 Elsevier Patient Education  [place].

## 2021-12-18 ENCOUNTER — Encounter: Payer: Self-pay | Admitting: Internal Medicine

## 2021-12-18 ENCOUNTER — Other Ambulatory Visit: Payer: Self-pay

## 2021-12-18 ENCOUNTER — Ambulatory Visit (INDEPENDENT_AMBULATORY_CARE_PROVIDER_SITE_OTHER): Payer: PPO | Admitting: Internal Medicine

## 2021-12-18 VITALS — BP 118/68 | HR 79 | Temp 98.3°F | Ht 67.0 in | Wt 135.4 lb

## 2021-12-18 DIAGNOSIS — J189 Pneumonia, unspecified organism: Secondary | ICD-10-CM

## 2021-12-18 DIAGNOSIS — R059 Cough, unspecified: Secondary | ICD-10-CM | POA: Diagnosis not present

## 2021-12-18 DIAGNOSIS — I5032 Chronic diastolic (congestive) heart failure: Secondary | ICD-10-CM

## 2021-12-18 DIAGNOSIS — R413 Other amnesia: Secondary | ICD-10-CM

## 2021-12-18 NOTE — Assessment & Plan Note (Signed)
No change 

## 2021-12-18 NOTE — Assessment & Plan Note (Signed)
CAP --sx's - 80+% better. CXR: COPD changes with chronic basilar interstitial disease and suspect superimposed acute infiltrate at RIGHT base. CXR in 1 month

## 2021-12-18 NOTE — Progress Notes (Signed)
Subjective:  Patient ID: GARO Christensen, male    DOB: 1932-02-14  Age: 88 y.o. MRN: [id number]  CC: Follow-up (2 week f/u)   HPI [name] presents for CAP sx's - 80+% better, COPD, CHF - better  Outpatient Medications Prior to Visit  Medication Sig Dispense Refill   acetaminophen (TYLENOL) 500 MG tablet Take 500 mg by mouth at bedtime.     alfuzosin (UROXATRAL) 10 MG 24 hr tablet Take 10 mg by mouth every evening.      atorvastatin (LIPITOR) 20 MG tablet TAKE 1 TABLET BY MOUTH ONCE DAILY 90 tablet 3   betamethasone dipropionate 0.05 % cream APPLY TOPICALLY TO THE AFFECTED AREA TWOTIMES DAILY 30 g 1   cinacalcet (SENSIPAR) 30 MG tablet Take 1 tablet (30 mg total) by mouth 3 (three) times a week. 40 tablet 3   dofetilide (TIKOSYN) 250 MCG capsule TAKE ONE CAPSULE BY MOUTH TWICE A DAY 180 capsule 2   dutasteride (AVODART) 0.5 MG capsule Take 0.5 mg by mouth every evening.      ELIQUIS 5 MG TABS tablet TAKE 1 TABLET BY MOUTH TWICE (2) DAILY 180 tablet 3   ENTRESTO 24-26 MG TAKE 1 TABLET BY MOUTH TWICE A DAY 180 tablet 3   escitalopram (LEXAPRO) 10 MG tablet Take 1 tablet (10 mg total) by mouth daily. 30 tablet 5   HYDROcodone bit-homatropine (HYCODAN) 5-1.5 MG/5ML syrup Take 5 mLs by mouth every 6 (six) hours as needed for cough. 240 mL 0   linaclotide (LINZESS) 290 MCG CAPS capsule Take 1 capsule (290 mcg total) by mouth daily as needed (constipation). 90 capsule 3   Multiple Vitamin (MULTIVITAMIN WITH MINERALS) TABS tablet Take 1 tablet by mouth every other day.     nitroGLYCERIN (NITROSTAT) 0.4 MG SL tablet DISSOLVE 1 TABLET UNDER TONGUE AS NEEDEDFOR CHEST PAIN. MAY REPEAT 5 MINUTES APART 3 TIMES IF NEEDED 25 tablet 3   Polyethyl Glycol-Propyl Glycol (SYSTANE OP) Place 1 drop into both eyes daily as needed (burning eyes).      triamcinolone cream (KENALOG) 0.1 % APPLY 1 APPLICATION TOPICALLY TWO TIMES DAILY AS NEEDED AVOIDING FACE, GROIN ANDARMPITS 80 g 0   vitamin B-12  (CYANOCOBALAMIN) 1000 MCG tablet Take 1,000 mcg by mouth daily.     metoprolol succinate (TOPROL-XL) 50 MG 24 hr tablet TAKE 1 TABLET BY MOUTH TWICE A DAY. TAKEWITH OR IMMEDIATELY FOLLOWING A MEAL 180 tablet 3   cefdinir (OMNICEF) 300 MG capsule Take 1 capsule (300 mg total) by mouth 2 (two) times daily. (Patient not taking: Reported on 12/18/2021) 20 capsule 0   magnesium oxide (MAG-OX) 400 (241.3 Mg) MG tablet TAKE 1 TABLET BY MOUTH ONCE DAILY (Patient not taking: Reported on 12/18/2021) 30 tablet 6   methylPREDNISolone (MEDROL DOSEPAK) 4 MG TBPK tablet As directed (Patient not taking: Reported on 12/18/2021) 21 tablet 0   No facility-administered medications prior to visit.    ROS: Review of Systems  Constitutional:  Positive for fatigue. Negative for appetite change and unexpected weight change.  HENT:  Negative for congestion, nosebleeds, sneezing, sore throat and trouble swallowing.   Eyes:  Negative for itching and visual disturbance.  Respiratory:  Positive for cough.   Cardiovascular:  Negative for chest pain, palpitations and leg swelling.  Gastrointestinal:  Negative for abdominal distention, blood in stool, diarrhea and nausea.  Genitourinary:  Negative for frequency and hematuria.  Musculoskeletal:  Negative for back pain, gait problem, joint swelling and neck pain.  Skin:  Negative for rash.  Neurological:  Negative for dizziness, tremors and speech difficulty.  Psychiatric/Behavioral:  Negative for agitation, dysphoric mood and sleep disturbance. The patient is not nervous/anxious.    Objective:  BP 118/68 (BP Location: Left Arm)    Pulse 79    Temp 98.3 F (36.8 C) (Oral)    Ht 5\' 7"  (1.702 m)    Wt 135 lb 6.4 oz (61.4 kg)    SpO2 96%    BMI 21.21 kg/m   BP Readings from Last 3 Encounters:  12/18/21 118/68  11/29/21 138/78  09/25/21 140/70    Wt Readings from Last 3 Encounters:  12/18/21 135 lb 6.4 oz (61.4 kg)  11/29/21 135 lb 9.6 oz (61.5 kg)  09/25/21 135 lb 6.4  oz (61.4 kg)    Physical Exam Constitutional:      General: He is not in acute distress.    Appearance: Normal appearance. He is well-developed.     Comments: NAD  Eyes:     Conjunctiva/sclera: Conjunctivae normal.     Pupils: Pupils are equal, round, and reactive to light.  Neck:     Thyroid: No thyromegaly.     Vascular: No JVD.  Cardiovascular:     Rate and Rhythm: Normal rate and regular rhythm.     Heart sounds: Normal heart sounds. No murmur heard.   No friction rub. No gallop.  Pulmonary:     Effort: Pulmonary effort is normal. No respiratory distress.     Breath sounds: Normal breath sounds. No wheezing or rales.  Chest:     Chest wall: No tenderness.  Abdominal:     General: Bowel sounds are normal. There is no distension.     Palpations: Abdomen is soft. There is no mass.     Tenderness: There is no abdominal tenderness. There is no guarding or rebound.  Musculoskeletal:        General: No tenderness. Normal range of motion.     Cervical back: Normal range of motion.  Lymphadenopathy:     Cervical: No cervical adenopathy.  Skin:    General: Skin is warm and dry.     Findings: No rash.  Neurological:     Mental Status: He is alert and oriented to person, place, and time.     Cranial Nerves: No cranial nerve deficit.     Motor: No abnormal muscle tone.     Coordination: Coordination normal.     Gait: Gait normal.     Deep Tendon Reflexes: Reflexes are normal and symmetric.  Psychiatric:        Behavior: Behavior normal.        Thought Content: Thought content normal.        Judgment: Judgment normal.    Lab Results  Component Value Date   WBC 6.6 11/29/2021   HGB 11.7 (L) 11/29/2021   HCT 35.4 (L) 11/29/2021   PLT 165.0 11/29/2021   GLUCOSE 93 11/29/2021   CHOL 107 12/30/2019   TRIG 102.0 12/30/2019   HDL 46.80 12/30/2019   LDLCALC 39 12/30/2019   ALT 10 11/29/2021   AST 21 11/29/2021   NA 135 11/29/2021   K 4.4 11/29/2021   CL 100 11/29/2021    CREATININE 1.10 11/29/2021   BUN 23 11/29/2021   CO2 31 11/29/2021   TSH 2.26 11/29/2021   PSA 12.45 (H) 02/06/2016   INR 1.1 04/02/2017   HGBA1C 6.1 12/30/2019    DG Chest 2 View  Result Date: 07/25/2021 CLINICAL DATA:  Shortness of breath, chronic systolic heart failure, increased shortness of breath with exertion, history coronary artery disease post MI and CABG, hypertension, CHF EXAM: CHEST - 2 VIEW COMPARISON:  01/24/2021 FINDINGS: LEFT subclavian ICD with leads projecting at RIGHT atrium and RIGHT ventricle. Normal heart size post CABG. Mediastinal contours and pulmonary vascularity normal. Atherosclerotic calcification aorta. Emphysematous and bronchitic changes consistent with COPD. Chronic interstitial lung disease and mid to lower lungs bilaterally. No acute infiltrate, pleural effusion, or pneumothorax. Bones demineralized. IMPRESSION: Post CABG and ICD. COPD changes with basilar chronic interstitial lung disease. No acute abnormalities. Aortic Atherosclerosis (ICD10-I70.0) and Emphysema (ICD10-J43.9). Electronically Signed   By: Lavonia Dana M.D.   On: 07/25/2021 17:57    Assessment & Plan:   Problem List Items Addressed This Visit     CAP (community acquired pneumonia)    CAP --sx's - 80+% better. CXR: COPD changes with chronic basilar interstitial disease and suspect superimposed acute infiltrate at RIGHT base. CXR in 1 month      Chronic diastolic CHF (congestive heart failure) (HCC)    Compensated       Cough in adult    CAP --sx's - 80+% better. CXR: COPD changes with chronic basilar interstitial disease and suspect superimposed acute infiltrate at RIGHT base.      Memory problem    No change         No orders of the defined types were placed in this encounter.     Follow-up: No follow-ups on file.  Walker Kehr, MD

## 2021-12-18 NOTE — Assessment & Plan Note (Signed)
CAP --sx's - 80+% better. CXR: COPD changes with chronic basilar interstitial disease and suspect superimposed acute infiltrate at RIGHT base.

## 2021-12-18 NOTE — Assessment & Plan Note (Signed)
Compensated 

## 2021-12-25 DIAGNOSIS — I10 Essential (primary) hypertension: Secondary | ICD-10-CM | POA: Diagnosis not present

## 2021-12-25 DIAGNOSIS — I5032 Chronic diastolic (congestive) heart failure: Secondary | ICD-10-CM | POA: Diagnosis not present

## 2021-12-26 ENCOUNTER — Ambulatory Visit: Payer: PPO | Admitting: Internal Medicine

## 2021-12-28 ENCOUNTER — Encounter: Payer: Self-pay | Admitting: *Deleted

## 2021-12-28 ENCOUNTER — Telehealth: Payer: PPO

## 2021-12-28 ENCOUNTER — Telehealth: Payer: Self-pay | Admitting: *Deleted

## 2021-12-28 NOTE — Telephone Encounter (Addendum)
°  Chronic Care Management   Follow Up Note   12/28/2021 Name: [name] Christensen MRN: [id number] DOB: 08/26/1932  Referred by: Cassandria Anger, MD Reason for referral : Chronic Care Management (CCM RN CM Follow Up Telephone Outreach- Unsuccessful attempt; CHF; post-COVID; CAP/ HTN)  An unsuccessful telephone outreach was attempted today. The patient was referred to the case management team for assistance with care management and care coordination.   Follow Up Plan:  A HIPPA compliant phone message was left for the patient providing contact information and requesting a return call Will place request with scheduling care guide to contact patient to re-schedule today's missed CCM RN follow up telephone appointment if I do not hear back from patient by end of day Addendum: Patient returned call at end of work day and we re-scheduled today's unsuccessful visit for next week  [name], RN, BSN, [place] [phone]: direct office

## 2022-01-02 ENCOUNTER — Ambulatory Visit (INDEPENDENT_AMBULATORY_CARE_PROVIDER_SITE_OTHER): Payer: PPO

## 2022-01-02 ENCOUNTER — Ambulatory Visit (INDEPENDENT_AMBULATORY_CARE_PROVIDER_SITE_OTHER): Payer: PPO | Admitting: *Deleted

## 2022-01-02 DIAGNOSIS — I10 Essential (primary) hypertension: Secondary | ICD-10-CM

## 2022-01-02 DIAGNOSIS — I5032 Chronic diastolic (congestive) heart failure: Secondary | ICD-10-CM

## 2022-01-02 DIAGNOSIS — I255 Ischemic cardiomyopathy: Secondary | ICD-10-CM

## 2022-01-02 DIAGNOSIS — R059 Cough, unspecified: Secondary | ICD-10-CM

## 2022-01-02 LAB — CUP PACEART REMOTE DEVICE CHECK
Battery Remaining Longevity: 38 mo
Battery Remaining Percentage: 43 %
Battery Voltage: 2.92 V
Brady Statistic AP VP Percent: 1 %
Brady Statistic AP VS Percent: 49 %
Brady Statistic AS VP Percent: 7.9 %
Brady Statistic AS VS Percent: 41 %
Brady Statistic RA Percent Paced: 44 %
Brady Statistic RV Percent Paced: 7.9 %
Date Time Interrogation Session: 20230208020015
HighPow Impedance: 69 Ohm
HighPow Impedance: 69 Ohm
Implantable Lead Implant Date: 20131218
Implantable Lead Implant Date: 20180514
Implantable Lead Location: 753859
Implantable Lead Location: 753860
Implantable Lead Model: 5076
Implantable Pulse Generator Implant Date: 20180514
Lead Channel Impedance Value: 390 Ohm
Lead Channel Impedance Value: 450 Ohm
Lead Channel Pacing Threshold Amplitude: 0.5 V
Lead Channel Pacing Threshold Amplitude: 1.25 V
Lead Channel Pacing Threshold Pulse Width: 0.5 ms
Lead Channel Pacing Threshold Pulse Width: 0.5 ms
Lead Channel Sensing Intrinsic Amplitude: 2.4 mV
Lead Channel Sensing Intrinsic Amplitude: 4.6 mV
Lead Channel Setting Pacing Amplitude: 2 V
Lead Channel Setting Pacing Amplitude: 2.5 V
Lead Channel Setting Pacing Pulse Width: 0.5 ms
Lead Channel Setting Sensing Sensitivity: 0.5 mV
Pulse Gen Serial Number: 1245762

## 2022-01-02 NOTE — Chronic Care Management (AMB) (Addendum)
Chronic Care Management   CCM RN Visit Note  01/02/2022 Name: [name] Christensen MRN: [id number] DOB: 1932-09-04  Subjective: [name] Christensen is a 88 y.o. year old male who is a primary care patient of Plotnikov, Georgina Quint, MD. The care management team was consulted for assistance with disease management and care coordination needs.    Engaged with patient by telephone for follow up visit in response to provider referral for case management and/or care coordination services.   Consent to Services:  The patient was given information about Chronic Care Management services, agreed to services, and gave verbal consent prior to initiation of services.  Please see initial visit note for detailed documentation.  Patient agreed to services and verbal consent obtained.   Assessment: Review of patient past medical history, allergies, medications, health status, including review of consultants reports, laboratory and other test data, was performed as part of comprehensive evaluation and provision of chronic care management services.  CCM Care Plan  Allergies  Allergen Reactions   Ezetimibe Other (See Comments)    Pt was in coma for 6 days, numbness   Penicillins Other (See Comments)    BLISTERS BETWEEN FINGERS Did it involve swelling of the face/tongue/throat, SOB, or low BP? No Did it involve sudden or severe rash/hives, skin peeling, or any reaction on the inside of your mouth or nose? Yes Did you need to seek medical attention at a hospital or doctor's office? No When did it last happen?      60 years If all above answers are NO, may proceed with cephalosporin use.    Pravastatin Sodium Other (See Comments)    Aches and pains in joints   Rosuvastatin Other (See Comments)    MYALGIAS   Niacin Anxiety and Other (See Comments)    "Makes me feel nervous, jittery"   Outpatient Encounter Medications as of 01/02/2022  Medication Sig   acetaminophen (TYLENOL) 500 MG tablet Take 500 mg by mouth at  bedtime.   alfuzosin (UROXATRAL) 10 MG 24 hr tablet Take 10 mg by mouth every evening.    atorvastatin (LIPITOR) 20 MG tablet TAKE 1 TABLET BY MOUTH ONCE DAILY   betamethasone dipropionate 0.05 % cream APPLY TOPICALLY TO THE AFFECTED AREA TWOTIMES DAILY   cinacalcet (SENSIPAR) 30 MG tablet Take 1 tablet (30 mg total) by mouth 3 (three) times a week.   dofetilide (TIKOSYN) 250 MCG capsule TAKE ONE CAPSULE BY MOUTH TWICE A DAY   dutasteride (AVODART) 0.5 MG capsule Take 0.5 mg by mouth every evening.    ELIQUIS 5 MG TABS tablet TAKE 1 TABLET BY MOUTH TWICE (2) DAILY   ENTRESTO 24-26 MG TAKE 1 TABLET BY MOUTH TWICE A DAY   escitalopram (LEXAPRO) 10 MG tablet Take 1 tablet (10 mg total) by mouth daily.   HYDROcodone bit-homatropine (HYCODAN) 5-1.5 MG/5ML syrup Take 5 mLs by mouth every 6 (six) hours as needed for cough.   linaclotide (LINZESS) 290 MCG CAPS capsule Take 1 capsule (290 mcg total) by mouth daily as needed (constipation).   metoprolol succinate (TOPROL-XL) 50 MG 24 hr tablet TAKE 1 TABLET BY MOUTH TWICE A DAY. TAKEWITH OR IMMEDIATELY FOLLOWING A MEAL   Multiple Vitamin (MULTIVITAMIN WITH MINERALS) TABS tablet Take 1 tablet by mouth every other day.   nitroGLYCERIN (NITROSTAT) 0.4 MG SL tablet DISSOLVE 1 TABLET UNDER TONGUE AS NEEDEDFOR CHEST PAIN. MAY REPEAT 5 MINUTES APART 3 TIMES IF NEEDED   Polyethyl Glycol-Propyl Glycol (SYSTANE OP) Place 1 drop into both  eyes daily as needed (burning eyes).    triamcinolone cream (KENALOG) 0.1 % APPLY 1 APPLICATION TOPICALLY TWO TIMES DAILY AS NEEDED AVOIDING FACE, GROIN ANDARMPITS   vitamin B-12 (CYANOCOBALAMIN) 1000 MCG tablet Take 1,000 mcg by mouth daily.   No facility-administered encounter medications on file as of 01/02/2022.   Patient Active Problem List   Diagnosis Date Noted   Anemia 09/25/2021   Osteoporosis 07/26/2021   Post-COVID chronic fatigue 07/26/2021   Diarrhea 07/03/2021   COVID-19 06/10/2021   Low blood pressure 06/10/2021    Aortic atherosclerosis (HCC) 04/02/2021   Apathy 04/02/2021   Memory problem 03/01/2021   Chronic renal insufficiency, stage 3 (moderate) (HCC) 01/01/2021   Persistent atrial fibrillation (HCC) 07/05/2019   Ischemic cardiomyopathy    Unstable angina (HCC)    Upper respiratory infection 09/23/2018   Hypocalciuric hypercalcemia 09/16/2018   Fall 08/17/2018   Facial pain 08/17/2018   Hematuria 05/20/2018   Paroxysmal atrial fibrillation (HCC)    Bronchiectasis (HCC) assoc with possible MAI 03/11/2018   Dyspnea on exertion 03/10/2018   Chronic diastolic CHF (congestive heart failure) (HCC) 03/09/2018   Atrial flutter (HCC) 03/09/2018   PVC's (premature ventricular contractions) 03/09/2018   Arthralgia 01/29/2018   Dry skin 12/22/2017   Carotid stenosis, asymptomatic, bilateral 12/22/2017   Chills with fever 11/24/2017   Dysuria 11/24/2017   Anxiety disorder 11/24/2017   Hepatic cirrhosis (HCC) 06/05/2017   Chronic calculous cholecystitis 06/05/2017   Weight loss 04/24/2017   Sinus node dysfunction (HCC) 04/07/2017   Chronic cholecystitis s/p lap cholecystectomy 06/05/2017 02/24/2017   Pleural effusion 02/24/2017   Dyslipidemia 08/05/2016   Fatigue 04/09/2016   Constipation 08/09/2015   Cough in adult 11/30/2014   LBP (low back pain) 08/30/2013   Chest pain, atypical 07/21/2013   Lung infiltrate 07/19/2013   Ventricular vs Supraventricular bigeminy 03/29/2013   Implantable cardioverter-defibrillator (ICD) in situ 11/19/2012   CAP (community acquired pneumonia) 11-29-12   Sudden cardiac death (HCC) 11/18/2012   CORONARY ARTERY BYPASS GRAFT, HX OF 10/11/2008   Insomnia 12/07/2007   Essential hypertension 09/01/2007   CAD (coronary artery disease) 09/01/2007   GERD 09/01/2007   BPH (benign prostatic hyperplasia) 09/01/2007   Conditions to be addressed/monitored:  CHF and HTN  Care Plan : RN Care Manager Plan of Care  Updates made by Michaela Corner, RN since 01/02/2022  12:00 AM     Problem: Chronic Disease Management Needs   Priority: Medium     Long-Range Goal: Ongoing adeherence to established plan of care for long term chronic disease management   Start Date: 09/06/2021  Expected End Date: 09/06/2022  Priority: Medium  Note:   Current Barriers:  Chronic Disease Management support and education needs related to CHF, HTN, and post-Covid syndrome 12/04/21: Spouse had recent head injury requiring surgery (November 2022)- patient has been under stress related to wife's injury: reports getting slowly better as wife recuperates; reports recuperating "well" and getting better "every day"-- follow up 01/02/22: reports wife "much better;" states wife is easily tired, but has resumed her normal ADL/iADL's  RNCM Clinical Goal(s):  Patient will demonstrate ongoing health management independence HTN; CHF; post-Covid syndrome  through collaboration with RN Care manager, provider, and care team.   Interventions: 1:1 collaboration with primary care provider regarding development and update of comprehensive plan of care as evidenced by provider attestation and co-signature Inter-disciplinary care team collaboration (see longitudinal plan of care) Evaluation of current treatment plan related to  self management and patient's adherence to  plan as established by provider Confirmed patient "feeling better;" after recent diagnosis of pneumonia: reports he took antibiotics/ prednisone as prescribed; states cough medicine "really helping, still taking;" would like a refill for the cough medication PCP prescribed 11/29/21-- I will make this request on patient's behalf Reviewed follow up PCP office visit 12/18/21- patient denies questions, concerns Confirmed no medication changes, patient continues independently self-managing  Pain assessment updated: recently "threw my shoulder out" while washing a hot pot in the kitchen: slowly improving; pain tolerable with conservative  interventions (rest, "muscle cream")  Post-COVID syndrome:  (Status: 01/02/22- Goal on Track (progressing): YES.)  Long-Term goal  Confirmed patient continues recuperating from recent long-COVID/ new pneumonia- starting to feel better; starting to gain weight back-- reports current weight maintained between "130-134 lbs" (no change from last outreach); reports weight today as "130.6 lbs" Patient reports continues to tolerate activity "okay;" states he has resumed going to gym "3 or 4 times a week;" he is walking 1 mile with each session, and using machines; avoiding upper extremity exercises due to recent shoulder soreness; tolerating activity/ workouts "good" Encouraged ongoing activity, and reminded patient to pace self and not over-do Reviewed with patient importance of daily weights in setting of CHF; he verbalizes a good understanding of rationale and confirms he continues monitoring daily Confirmed no clinical concerns around shortness of breath outside of baseline, swelling- denies Reviewed established action plan for weight gain Confirmed patient plans to schedule cardiology office visit "soon"  Hypertension: (Status: 01/02/22- Goal on Track (progressing): YES.) Long-Term goal Last practice recorded BP readings:  BP Readings from Last 3 Encounters:  11/29/21 138/78  09/25/21 140/70  08/28/21 110/60  Most recent eGFR/CrCl: No results found for: EGFR  No components found for: CRCL  Evaluation of current treatment plan related to hypertension self management and patient's adherence to plan as established by provider;   Discussed plans with patient for ongoing care management follow up and provided patient with direct contact information for care management team; Confirmed patient occasionally monitors blood pressures at home, reports "all have been okay;" does not have specific values for our review today; encouraged ongoing home monitoring Confirms is still not taking blood pressure  medications; denies medication concerns today Reinforced previously provided verbal education re: signs/ symptoms yellow CHF zone along with corresponding action plan; today, he denies clinical concerns around breathing status, lower extremity/ abdominal swelling and sounds to be in no distress throughout phone call today Reviewed upcoming provider appointments with patient: 01/30/22- PCP; confirmed patient aware, has plans at attend as scheduled  Patient Goals/Self-Care Activities: As evidenced by review of EHR, collaboration with care team, and patient reporting during CCM RN CM outreach,  Patient Oryan will: Take medications as prescribed   Attend all scheduled provider appointments  Call pharmacy for medication refills  Call provider office for new concerns or questions  Continue to follow heart healthy, low salt, carbohydrate modified, low sugar diet Continue to monitor and record blood pressures at home weekly Continue to monitor and record weights at home weekly- you reported a weight today of 130.6 lbs Continue to stay active on a regular basis and will continue to pace activity and not over-do  Continue to take precautions to prevent falls    Plan: Telephone follow up appointment with care management team member scheduled for:  Monday Apr 01, 2022 at 2:30 pm The patient has been provided with contact information for the care management team and has been advised to call with any  health related questions or concerns  Caryl Pina, RN, BSN, CCRN Alumnus [facility] RN Care Coordination- [facility] [place] [phone]: direct office   Medical screening examination/treatment/procedure(s) were performed by non-physician practitioner and as supervising physician I was immediately available for consultation/collaboration.  I agree with above. Jacinta Shoe, MD

## 2022-01-02 NOTE — Patient Instructions (Signed)
Visit [place], thank you for taking time to talk with me today. Please don't hesitate to contact me if I can be of assistance to you before our next scheduled telephone appointment  As we discussed today, I have asked Dr. Alain Marion to refill your prescription for the cough medicine he prescribed for your pneumonia  Below are the goals we discussed today:  Patient Self-Care Activities: Patient [name] Christensen will: Take medications as prescribed   Attend all scheduled provider appointments  Call pharmacy for medication refills  Call provider office for new concerns or questions  Continue to follow heart healthy, low salt, carbohydrate modified, low sugar diet Continue to monitor and record blood pressures at home weekly Continue to monitor and record weights at home weekly- you reported a weight today of 130.6 lbs Continue to stay active on a regular basis and will continue to pace activity and not over-do  Continue to take precautions to prevent falls  Our next scheduled telephone follow up visit/ appointment with care management team member is scheduled on:   Monday, Apr 01, 2022 at 2:30 pm- This is a PHONE [place] appointment  If you need to cancel or re-schedule our visit, please call [phone] and our care guide team will be happy to assist you.   I look forward to hearing about your progress.   [name], RN, BSN, [place] [phone]: direct office  If you are experiencing a Mental Health or [place] or need someone to talk to, please  call the Suicide and Crisis Lifeline: 988 call the [place] National Suicide Prevention Lifeline: (438)237-1401 or TTY: (731)474-7170 TTY (201) 452-0076) to talk to a trained counselor call 1-800-273-TALK (toll free, 24 hour hotline) go to [facility] [address] [place] ([phone]) call 911   Patient verbalizes  understanding of instructions and care plan provided today and agrees to view in [place]. Active MyChart status confirmed with patient  Living With Heart Failure Heart failure is a long-term (chronic) condition in which the heart cannot pump enough blood through the body. When this happens, parts of the body do not get the blood and oxygen they need. There is no cure for heart failure at this time, so it is important for you to take good care of yourself and follow the treatment plan you set with your health care provider. If you are living with heart failure, there are ways to help you manage the disease. How to manage lifestyle changes Living with heart failure requires you to make changes in your life. Your health care team will teach you about the changes you need to make in order to relieve your symptoms and lower your risk of going to the hospital. Work with your health care provider to develop a treatment plan that works for you. Activity Ask your health care provider about attending cardiac rehabilitation. These programs include aerobic physical activity, which provides many benefits for your heart. If no cardiac rehabilitation program is available, ask your health care provider what aerobic exercises are safe for you to do. Return to your normal activities as told by your health care provider. Ask your health care provider what activities are safe for you. Pace your daily activities and allow time for rest as needed. Managing stress It is normal to have many emotions about your diagnosis, such as fear, sadness, anger, and loss. If you feel any of these emotions and need help  coping, contact your health care provider. Here are some ways to help yourself manage these emotions: Talk to friends and family members about your condition. They can give you support and guidance. Explain your symptoms to them and, if comfortable, invite them to attend appointments or rehabilitation with you. Join a  support group for people with chronic heart failure. Talking with other people who have the same symptoms may give you new ways of coping with your disease and your emotions. Accept help from others. Do not be ashamed if you need help with certain tasks. Use stress management techniques, such as meditation, breathing exercises, or listening to relaxing music. Conditions such as depression and anxiety are common in persons with heart failure. Pay attention to changes in your mood, emotions, and stress levels. Tell your health care provider if you have any of the following symptoms: Trouble sleeping or a change in your sleeping patterns. Feeling sad, down, or depressed more often than not, every day for more than 2 weeks. Losing interest in activities you normally enjoy. Feeling irritable or crying for no reason. Finding yourself worrying about the future often. Work You may need to develop a plan with your health care provider if heart failure interferes with your ability to work. This may include: Reducing your work hours. Finding functions that are less active or require less effort. Planning rest periods during your work hours. Travel Talk with your health care provider if you plan to travel. There may be circumstances in which your health care provider recommends that you do not travel or that you delay travel until your condition is under control. When you travel, bring your medicine and a list of your medicines. If you are traveling by air, keep your medicines with you in a carry-on bag. Consider finding a medical facility in the area you will be traveling to and determine what your health insurance will cover. If you will be traveling by public transportation (airplane, train, bus), contact the company prior to traveling if you have special needs. This may include needs related to diet, oxygen, a wheelchair, a seating request, or help with luggage. If you use oxygen, make sure to bring  enough oxygen with you. If you have a battery-powered device, bring a fully charged extra battery with you. If you have a device, bring a note from your health care provider and inform all security screening personnel that you have the device. You may need to go through special screening for safety. Sexual activity  Ask your health care provider when it is safe for you to resume sexual activity. You may need to start slowly and gradually increase intimacy. You can increase intimacy by doing such things as caressing, touching, and holding each other. Get regular exercise as told by your health care provider. This can benefit your sex life by building strength and endurance. Sleep If your condition interferes with your sleep, find ways to improve your sleep quality, such as: Sleep lying on your side, or sleep with your head elevated by raising the head of your bed or using multiple pillows. Ask your health care provider about screening for sleep apnea. Try to go to sleep and wake up at the same times every day. Sleep in a dark, cool room. Do not do any physical activity or eating for a few hours before bedtime. Plan rest periods during the day, but do not take long naps during the day.  Where to find support Consider talking with: Family members. Close  friends. A mental health professional or therapist. A member of your church, faith, or community group. Other sources of support include: Local support groups. Ask your health care provider about groups near you. Online support groups, such as those found through the American Heart Association: supportnetwork.heart.org Local home care agencies, community agencies, or social agencies. A palliative care specialist. Palliative care can help you manage symptoms, promote comfort, improve quality of life, and maintain dignity. Where to find more information American Heart Association: heart.org National Heart, Lung, and Blood Institute:  https://www.hartman-hill.biz/ Centers for Disease Control and Prevention: https://www.reeves.com/ [place]: SolutionApps.it Contact a health care provider if: You have a rapid weight gain. You have increasing shortness of breath that is unusual for you. You are unable to participate in your usual physical activities. You tire easily. You have difficulty sleeping, such as: You wake up feeling short of breath. You have to use more pillows to raise your head in order to sleep. You cough more than normal, especially with physical activity. You have any swelling or more swelling in areas such as your hands, feet, ankles, or abdomen. You become dizzy or light-headed when you stand up. You have changes to your appetite. You have symptoms of depression or anxiety. Get help right away if: You have difficulty breathing. You notice or your family notices a change in your awareness, such as having trouble staying awake or having difficulty with concentration. You have pain or discomfort in your chest. You have an episode of fainting (syncope). You feel like your heart is beating quickly (palpitations). You have extreme feelings of sadness or loss of hope, or you have thoughts about hurting yourself or others. These symptoms may represent a serious problem that is an emergency. Do not wait to see if the symptoms will go away. Get medical help right away. Call your local emergency services (911 in the [place] Do not drive yourself to the hospital. Summary There is no cure for heart failure, so it is important for you to take good care of yourself and follow the treatment plan set by your health care provider. Ask your health care provider about attending cardiac rehabilitation. These programs include aerobic physical activity, which provides many benefits for your heart. It is normal to have many emotions about your diagnosis, such as fear, sadness, anger, and loss. If you feel any of these  emotions and need help coping, contact your health care provider. You may need to develop a plan with your health care provider if heart failure interferes with your ability to work. This information is not intended to replace advice given to you by your health care provider. Make sure you discuss any questions you have with your health care provider. Document Revised: 06/26/2020 Document Reviewed: 06/26/2020 Elsevier Patient Education  2022 [employer].

## 2022-01-07 NOTE — Progress Notes (Signed)
Remote ICD transmission.   

## 2022-01-14 ENCOUNTER — Other Ambulatory Visit: Payer: Self-pay | Admitting: Internal Medicine

## 2022-01-14 ENCOUNTER — Ambulatory Visit: Payer: PPO | Admitting: *Deleted

## 2022-01-14 DIAGNOSIS — G9332 Myalgic encephalomyelitis/chronic fatigue syndrome: Secondary | ICD-10-CM

## 2022-01-14 DIAGNOSIS — I10 Essential (primary) hypertension: Secondary | ICD-10-CM

## 2022-01-14 DIAGNOSIS — I5032 Chronic diastolic (congestive) heart failure: Secondary | ICD-10-CM

## 2022-01-14 NOTE — Chronic Care Management (AMB) (Addendum)
Chronic Care Management   CCM RN Visit Note  01/14/2022 Name: [name] Christensen MRN: [id number] DOB: 1932-10-16  Subjective: [name] Christensen is a 88 y.o. year old male who is a primary care patient of Plotnikov, Georgina Quint, MD. The care management team was consulted for assistance with disease management and care coordination needs.    Collaboration with patient's outpatient pharmacy  for  acute/ unscheduled outreach  in response to provider referral for case management and/or care coordination services.   Consent to Services:  The patient was given information about Chronic Care Management services, agreed to services, and gave verbal consent prior to initiation of services.  Please see initial visit note for detailed documentation.  Patient agreed to services and verbal consent obtained.   Assessment: Review of patient past medical history, allergies, medications, health status, including review of consultants reports, laboratory and other test data, was performed as part of comprehensive evaluation and provision of chronic care management services.   CCM Care Plan  Allergies  Allergen Reactions   Ezetimibe Other (See Comments)    Pt was in coma for 6 days, numbness   Penicillins Other (See Comments)    BLISTERS BETWEEN FINGERS Did it involve swelling of the face/tongue/throat, SOB, or low BP? No Did it involve sudden or severe rash/hives, skin peeling, or any reaction on the inside of your mouth or nose? Yes Did you need to seek medical attention at a hospital or doctor's office? No When did it last happen?      60 years If all above answers are NO, may proceed with cephalosporin use.    Pravastatin Sodium Other (See Comments)    Aches and pains in joints   Rosuvastatin Other (See Comments)    MYALGIAS   Niacin Anxiety and Other (See Comments)    "Makes me feel nervous, jittery"   Outpatient Encounter Medications as of 01/14/2022  Medication Sig Note   acetaminophen (TYLENOL)  500 MG tablet Take 500 mg by mouth at bedtime.    alfuzosin (UROXATRAL) 10 MG 24 hr tablet Take 10 mg by mouth every evening.     atorvastatin (LIPITOR) 20 MG tablet TAKE 1 TABLET BY MOUTH ONCE DAILY    betamethasone dipropionate 0.05 % cream APPLY TOPICALLY TO THE AFFECTED AREA TWOTIMES DAILY    cinacalcet (SENSIPAR) 30 MG tablet Take 1 tablet (30 mg total) by mouth 3 (three) times a week.    dofetilide (TIKOSYN) 250 MCG capsule TAKE ONE CAPSULE BY MOUTH TWICE A DAY    dutasteride (AVODART) 0.5 MG capsule Take 0.5 mg by mouth every evening.     ELIQUIS 5 MG TABS tablet TAKE 1 TABLET BY MOUTH TWICE (2) DAILY    ENTRESTO 24-26 MG TAKE 1 TABLET BY MOUTH TWICE A DAY    escitalopram (LEXAPRO) 10 MG tablet Take 1 tablet (10 mg total) by mouth daily.    HYDROcodone bit-homatropine (HYCODAN) 5-1.5 MG/5ML syrup Take 5 mLs by mouth every 6 (six) hours as needed for cough. 01/14/2022: 01/14/22-- contacted patient's OP pharmacy and requested they send refill request to PCP   linaclotide (LINZESS) 290 MCG CAPS capsule Take 1 capsule (290 mcg total) by mouth daily as needed (constipation).    metoprolol succinate (TOPROL-XL) 50 MG 24 hr tablet TAKE 1 TABLET BY MOUTH TWICE A DAY. TAKEWITH OR IMMEDIATELY FOLLOWING A MEAL    Multiple Vitamin (MULTIVITAMIN WITH MINERALS) TABS tablet Take 1 tablet by mouth every other day.    nitroGLYCERIN (NITROSTAT) 0.4 MG  SL tablet DISSOLVE 1 TABLET UNDER TONGUE AS NEEDEDFOR CHEST PAIN. MAY REPEAT 5 MINUTES APART 3 TIMES IF NEEDED    Polyethyl Glycol-Propyl Glycol (SYSTANE OP) Place 1 drop into both eyes daily as needed (burning eyes).     triamcinolone cream (KENALOG) 0.1 % APPLY 1 APPLICATION TOPICALLY TWO TIMES DAILY AS NEEDED AVOIDING FACE, GROIN ANDARMPITS    vitamin B-12 (CYANOCOBALAMIN) 1000 MCG tablet Take 1,000 mcg by mouth daily.    No facility-administered encounter medications on file as of 01/14/2022.   Patient Active Problem List   Diagnosis Date Noted   Anemia  09/25/2021   Osteoporosis 07/26/2021   Post-COVID chronic fatigue 07/26/2021   Diarrhea 07/03/2021   COVID-19 06/10/2021   Low blood pressure 06/10/2021   Aortic atherosclerosis (HCC) 04/02/2021   Apathy 04/02/2021   Memory problem 03/01/2021   Chronic renal insufficiency, stage 3 (moderate) (HCC) 01/01/2021   Persistent atrial fibrillation (HCC) 07/05/2019   Ischemic cardiomyopathy    Unstable angina (HCC)    Upper respiratory infection 09/23/2018   Hypocalciuric hypercalcemia 09/16/2018   Fall 08/17/2018   Facial pain 08/17/2018   Hematuria 05/20/2018   Paroxysmal atrial fibrillation (HCC)    Bronchiectasis (HCC) assoc with possible MAI 03/11/2018   Dyspnea on exertion 03/10/2018   Chronic diastolic CHF (congestive heart failure) (HCC) 03/09/2018   Atrial flutter (HCC) 03/09/2018   PVC's (premature ventricular contractions) 03/09/2018   Arthralgia 01/29/2018   Dry skin 12/22/2017   Carotid stenosis, asymptomatic, bilateral 12/22/2017   Chills with fever 11/24/2017   Dysuria 11/24/2017   Anxiety disorder 11/24/2017   Hepatic cirrhosis (HCC) 06/05/2017   Chronic calculous cholecystitis 06/05/2017   Weight loss 04/24/2017   Sinus node dysfunction (HCC) 04/07/2017   Chronic cholecystitis s/p lap cholecystectomy 06/05/2017 02/24/2017   Pleural effusion 02/24/2017   Dyslipidemia 08/05/2016   Fatigue 04/09/2016   Constipation 08/09/2015   Cough in adult 11/30/2014   LBP (low back pain) 08/30/2013   Chest pain, atypical 07/21/2013   Lung infiltrate 07/19/2013   Ventricular vs Supraventricular bigeminy 04/01/2013   Implantable cardioverter-defibrillator (ICD) in situ 11/15/2012   CAP (community acquired pneumonia) 2012-11-13   Sudden cardiac death (HCC) 10/26/2012   CORONARY ARTERY BYPASS GRAFT, HX OF 10/11/2008   Insomnia 12/07/2007   Essential hypertension 09/01/2007   CAD (coronary artery disease) 09/01/2007   GERD 09/01/2007   BPH (benign prostatic hyperplasia)  09/01/2007   Conditions to be addressed/monitored:  CHF, HTN, and long term post-COVID  Care Plan : RN Care Manager Plan of Care  Updates made by Michaela Corner, RN since 01/14/2022 12:00 AM     Problem: Chronic Disease Management Needs   Priority: Medium     Long-Range Goal: Ongoing adherence to established plan of care for long term chronic disease management   Start Date: 09/06/2021  Expected End Date: 09/06/2022  Priority: Medium  Note:   Current Barriers:  Chronic Disease Management support and education needs related to CHF, HTN, and post-Covid syndrome 12/04/21: Spouse had recent head injury requiring surgery (November 2022)- patient has been under stress related to wife's injury: reports getting slowly better as wife recuperates; reports recuperating "well" and getting better "every day"-- follow up 01/02/22: reports wife "much better;" states wife is easily tired, but has resumed her normal ADL/iADL's  RNCM Clinical Goal(s):  Patient will demonstrate ongoing health management independence HTN; CHF; post-Covid syndrome  through collaboration with RN Care manager, provider, and care team.   Interventions: 1:1 collaboration with primary care provider  regarding development and update of comprehensive plan of care as evidenced by provider attestation and co-signature Inter-disciplinary care team collaboration (see longitudinal plan of care) Evaluation of current treatment plan related to  self management and patient's adherence to plan as established by provider Confirmed patient "feeling better;" after recent diagnosis of pneumonia: reports he took antibiotics/ prednisone as prescribed; states cough medicine "really helping, still taking;" would like a refill for the cough medication PCP prescribed 11/29/21-- I will make this request on patient's behalf 01/14/22 Care coordination outreach: patient left off-hours voice message stating that he has not yet heard from outpatient pharmacy  about the refill of his cough medication  which he requested during our last outreach on 01/02/22; he is requesting follow up on refill status Contacted patient and left voice message confirming that I had placed his request with PCP on 01/02/22; unable to verify any return response from PCP Care Coordination outreach on patient's behalf to his outpatient pharmacy-- requested that outpatient  pharmacy send refill request to PCP, per standard refill process; spoke with Morrie Sheldon- she will send re-fill request for Hycodan cough syrup to PCP; I will also re-message PCP and again make him aware of patient's request Re-contacted patient and provided update via voice messaging that I had made this request once again with both PCP and with outpatient pharmacy Patient contacted me in follow up, left voice message requesting callback; returned patient's call and updated him on what I have done today to address his re-fill request; patient appreciative of follow up Reviewed follow up PCP office visit 12/18/21- patient denies questions, concerns Confirmed no medication changes, patient continues independently self-managing  Pain assessment updated: recently "threw my shoulder out" while washing a hot pot in the kitchen: slowly improving; pain tolerable with conservative interventions (rest, "muscle cream")  From 01/02/22 outreach:  Post-COVID syndrome:  (Status: 01/02/22- Goal on Track (progressing): YES.)  Long-Term goal  Confirmed patient continues recuperating from recent long-COVID/ new pneumonia- starting to feel better; starting to gain weight back-- reports current weight maintained between "130-134 lbs" (no change from last outreach); reports weight today as "130.6 lbs" Patient reports continues to tolerate activity "okay;" states he has resumed going to gym "3 or 4 times a week;" he is walking 1 mile with each session, and using machines; avoiding upper extremity exercises due to recent shoulder soreness;  tolerating activity/ workouts "good" Encouraged ongoing activity, and reminded patient to pace self and not over-do Reviewed with patient importance of daily weights in setting of CHF; he verbalizes a good understanding of rationale and confirms he continues monitoring daily Confirmed no clinical concerns around shortness of breath outside of baseline, swelling- denies Reviewed established action plan for weight gain Confirmed patient plans to schedule cardiology office visit "soon"  Hypertension: (Status: 01/02/22- Goal on Track (progressing): YES.) Long-Term goal Last practice recorded BP readings:  BP Readings from Last 3 Encounters:  11/29/21 138/78  09/25/21 140/70  08/28/21 110/60  Most recent eGFR/CrCl: No results found for: EGFR  No components found for: CRCL  Evaluation of current treatment plan related to hypertension self management and patient's adherence to plan as established by provider;   Discussed plans with patient for ongoing care management follow up and provided patient with direct contact information for care management team; Confirmed patient occasionally monitors blood pressures at home, reports "all have been okay;" does not have specific values for our review today; encouraged ongoing home monitoring Confirms is still not taking blood pressure medications; denies medication  concerns today Reinforced previously provided verbal education re: signs/ symptoms yellow CHF zone along with corresponding action plan; today, he denies clinical concerns around breathing status, lower extremity/ abdominal swelling and sounds to be in no distress throughout phone call today Reviewed upcoming provider appointments with patient: 01/30/22- PCP; confirmed patient aware, has plans at attend as scheduled  Patient Goals/Self-Care Activities: As evidenced by review of EHR, collaboration with care team, and patient reporting during CCM RN CM outreach,  Patient Avion will: Take medications  as prescribed   Attend all scheduled provider appointments  Call pharmacy for medication refills  Call provider office for new concerns or questions  Continue to follow heart healthy, low salt, carbohydrate modified, low sugar diet Continue to monitor and record blood pressures at home weekly Continue to monitor and record weights at home weekly- you reported a weight today of 130.6 lbs Continue to stay active on a regular basis and will continue to pace activity and not over-do  Continue to take precautions to prevent falls    Plan: Telephone follow up appointment with care management team member as previously scheduled for:  Monday, Apr 01, 2022 at 2:30 pm The patient has been provided with contact information for the care management team and has been advised to call with any health related questions or concerns   Caryl Pina, RN, BSN, CCRN Alumnus [facility] RN Care Coordination- [facility] [place] [phone]: direct office     Medical screening examination/treatment/procedure(s) were performed by non-physician practitioner and as supervising physician I was immediately available for consultation/collaboration.  I agree with above. Jacinta Shoe, MD

## 2022-01-16 NOTE — Telephone Encounter (Signed)
Patient is requesting a refill of HYDROcodone bit-homatropine (HYCODAN)

## 2022-01-16 NOTE — Telephone Encounter (Signed)
LOV: 10/28/22

## 2022-01-22 DIAGNOSIS — I10 Essential (primary) hypertension: Secondary | ICD-10-CM

## 2022-01-22 DIAGNOSIS — I5032 Chronic diastolic (congestive) heart failure: Secondary | ICD-10-CM | POA: Diagnosis not present

## 2022-01-30 ENCOUNTER — Ambulatory Visit (INDEPENDENT_AMBULATORY_CARE_PROVIDER_SITE_OTHER): Payer: PPO

## 2022-01-30 ENCOUNTER — Encounter: Payer: Self-pay | Admitting: Internal Medicine

## 2022-01-30 ENCOUNTER — Other Ambulatory Visit: Payer: Self-pay

## 2022-01-30 ENCOUNTER — Ambulatory Visit (INDEPENDENT_AMBULATORY_CARE_PROVIDER_SITE_OTHER): Payer: PPO | Admitting: Internal Medicine

## 2022-01-30 VITALS — BP 116/72 | HR 97 | Temp 97.8°F | Ht 67.0 in | Wt 134.6 lb

## 2022-01-30 DIAGNOSIS — E785 Hyperlipidemia, unspecified: Secondary | ICD-10-CM

## 2022-01-30 DIAGNOSIS — I480 Paroxysmal atrial fibrillation: Secondary | ICD-10-CM

## 2022-01-30 DIAGNOSIS — F419 Anxiety disorder, unspecified: Secondary | ICD-10-CM | POA: Diagnosis not present

## 2022-01-30 DIAGNOSIS — J189 Pneumonia, unspecified organism: Secondary | ICD-10-CM | POA: Diagnosis not present

## 2022-01-30 DIAGNOSIS — M79601 Pain in right arm: Secondary | ICD-10-CM | POA: Diagnosis not present

## 2022-01-30 DIAGNOSIS — R413 Other amnesia: Secondary | ICD-10-CM | POA: Diagnosis not present

## 2022-01-30 DIAGNOSIS — R918 Other nonspecific abnormal finding of lung field: Secondary | ICD-10-CM | POA: Diagnosis not present

## 2022-01-30 DIAGNOSIS — I10 Essential (primary) hypertension: Secondary | ICD-10-CM

## 2022-01-30 DIAGNOSIS — R059 Cough, unspecified: Secondary | ICD-10-CM

## 2022-01-30 LAB — COMPREHENSIVE METABOLIC PANEL
ALT: 9 U/L (ref 0–53)
AST: 20 U/L (ref 0–37)
Albumin: 3.9 g/dL (ref 3.5–5.2)
Alkaline Phosphatase: 86 U/L (ref 39–117)
BUN: 20 mg/dL (ref 6–23)
CO2: 33 mEq/L — ABNORMAL HIGH (ref 19–32)
Calcium: 10.8 mg/dL — ABNORMAL HIGH (ref 8.4–10.5)
Chloride: 100 mEq/L (ref 96–112)
Creatinine, Ser: 1.02 mg/dL (ref 0.40–1.50)
GFR: 66.18 mL/min (ref >60.00)
Glucose, Bld: 95 mg/dL (ref 70–99)
Potassium: 4.3 mEq/L (ref 3.5–5.1)
Sodium: 137 mEq/L (ref 135–145)
Total Bilirubin: 0.5 mg/dL (ref 0.2–1.2)
Total Protein: 8.1 g/dL (ref 6.0–8.3)

## 2022-01-30 NOTE — Assessment & Plan Note (Signed)
On Lipitor 

## 2022-01-30 NOTE — Progress Notes (Signed)
Subjective:  Patient ID: [name] Christensen, male    DOB: December 31, 1931  Age: 88 y.o. MRN: [id number]  CC: Follow-up (Per appointment notes: patient's wife says patient is starting to become more agressive & angry (not every day but it is more common now than before).[name] also thinks he is still suffering from paranoia.. (says she wanted to make nurse & provider aware bc its hard to discuss some things in front of patient))   HPI [name] Christensen presents for Follow-up: patient's wife says patient is starting to become more agressive & angry (not every day but it is more common now than before), also thinks he is still suffering from paranoia.. (says she wanted to make nurse & provider aware bc its hard to discuss some things in front of patient)). F/u dementia  C/o cough, nasal congestion  Pt wants to d/c Lexapro   Outpatient Medications Prior to Visit  Medication Sig Dispense Refill   acetaminophen (TYLENOL) 500 MG tablet Take 500 mg by mouth at bedtime.     alfuzosin (UROXATRAL) 10 MG 24 hr tablet Take 10 mg by mouth every evening.      atorvastatin (LIPITOR) 20 MG tablet TAKE 1 TABLET BY MOUTH ONCE DAILY 90 tablet 3   betamethasone dipropionate 0.05 % cream APPLY TOPICALLY TO THE AFFECTED AREA TWOTIMES DAILY 30 g 1   cinacalcet (SENSIPAR) 30 MG tablet Take 1 tablet (30 mg total) by mouth 3 (three) times a week. 40 tablet 3   dofetilide (TIKOSYN) 250 MCG capsule TAKE ONE CAPSULE BY MOUTH TWICE A DAY 180 capsule 2   dutasteride (AVODART) 0.5 MG capsule Take 0.5 mg by mouth every evening.      ELIQUIS 5 MG TABS tablet TAKE 1 TABLET BY MOUTH TWICE (2) DAILY 180 tablet 3   ENTRESTO 24-26 MG TAKE 1 TABLET BY MOUTH TWICE A DAY 180 tablet 3   escitalopram (LEXAPRO) 10 MG tablet Take 1 tablet (10 mg total) by mouth daily. 30 tablet 5   HYDROcodone bit-homatropine (HYCODAN) 5-1.5 MG/5ML syrup TAKE 5 MLS BY MOUTH EVERY 6 HOURS AS NEEDED FOR COUGH 240 mL 0   linaclotide (LINZESS) 290 MCG CAPS capsule Take  1 capsule (290 mcg total) by mouth daily as needed (constipation). 90 capsule 3   metoprolol succinate (TOPROL-XL) 50 MG 24 hr tablet TAKE 1 TABLET BY MOUTH TWICE A DAY. TAKEWITH OR IMMEDIATELY FOLLOWING A MEAL 180 tablet 3   Multiple Vitamin (MULTIVITAMIN WITH MINERALS) TABS tablet Take 1 tablet by mouth every other day.     nitroGLYCERIN (NITROSTAT) 0.4 MG SL tablet DISSOLVE 1 TABLET UNDER TONGUE AS NEEDEDFOR CHEST PAIN. MAY REPEAT 5 MINUTES APART 3 TIMES IF NEEDED 25 tablet 3   Polyethyl Glycol-Propyl Glycol (SYSTANE OP) Place 1 drop into both eyes daily as needed (burning eyes).      triamcinolone cream (KENALOG) 0.1 % APPLY 1 APPLICATION TOPICALLY TWO TIMES DAILY AS NEEDED AVOIDING FACE, GROIN ANDARMPITS 80 g 0   vitamin B-12 (CYANOCOBALAMIN) 1000 MCG tablet Take 1,000 mcg by mouth daily.     No facility-administered medications prior to visit.    ROS: Review of Systems  Constitutional:  Negative for appetite change, fatigue and unexpected weight change.  HENT:  Negative for congestion, nosebleeds, sneezing, sore throat and trouble swallowing.   Eyes:  Negative for itching and visual disturbance.  Respiratory:  Negative for cough.   Cardiovascular:  Negative for chest pain, palpitations and leg swelling.  Gastrointestinal:  Negative for abdominal distention,  blood in stool, diarrhea and nausea.  Genitourinary:  Negative for frequency and hematuria.  Musculoskeletal:  Negative for back pain, gait problem, joint swelling and neck pain.  Skin:  Negative for rash.  Neurological:  Negative for dizziness, tremors, speech difficulty and weakness.  Psychiatric/Behavioral:  Negative for agitation, dysphoric mood and sleep disturbance. The patient is not nervous/anxious.    Objective:  BP 116/72 (BP Location: Left Arm, Patient Position: Sitting, Cuff Size: Normal)    Pulse 97    Temp 97.8 F (36.6 C) (Oral)    Ht '5\' 7"'$  (1.702 m)    Wt 134 lb 9.6 oz (61.1 kg)    SpO2 97%    BMI 21.08 kg/m    BP Readings from Last 3 Encounters:  01/30/22 116/72  12/18/21 118/68  11/29/21 138/78    Wt Readings from Last 3 Encounters:  01/30/22 134 lb 9.6 oz (61.1 kg)  12/18/21 135 lb 6.4 oz (61.4 kg)  11/29/21 135 lb 9.6 oz (61.5 kg)    Physical Exam Constitutional:      General: He is not in acute distress.    Appearance: He is well-developed.     Comments: NAD  Eyes:     Conjunctiva/sclera: Conjunctivae normal.     Pupils: Pupils are equal, round, and reactive to light.  Neck:     Thyroid: No thyromegaly.     Vascular: No JVD.  Cardiovascular:     Rate and Rhythm: Normal rate and regular rhythm.     Heart sounds: Normal heart sounds. No murmur heard.   No friction rub. No gallop.  Pulmonary:     Effort: Pulmonary effort is normal. No respiratory distress.     Breath sounds: Normal breath sounds. No wheezing or rales.  Chest:     Chest wall: No tenderness.  Abdominal:     General: Bowel sounds are normal. There is no distension.     Palpations: Abdomen is soft. There is no mass.     Tenderness: There is no abdominal tenderness. There is no guarding or rebound.  Musculoskeletal:        General: No tenderness. Normal range of motion.     Cervical back: Normal range of motion.  Lymphadenopathy:     Cervical: No cervical adenopathy.  Skin:    General: Skin is warm and dry.     Findings: No rash.  Neurological:     Mental Status: He is alert and oriented to person, place, and time.     Cranial Nerves: No cranial nerve deficit.     Motor: No abnormal muscle tone.     Coordination: Coordination normal.     Gait: Gait normal.     Deep Tendon Reflexes: Reflexes are normal and symmetric.  Psychiatric:        Behavior: Behavior normal.        Thought Content: Thought content normal.        Judgment: Judgment normal.     A total time of 45 minutes was spent preparing to see the patient, reviewing tests, x-rays, operative reports and other medical records.  Also, obtaining  history and performing comprehensive physical exam.  Additionally, counseling the patient regarding the above listed issues.   Finally, documenting clinical information in the health records, coordination of care, talking w/the the patient and [name] about memory and mood issues.   Lab Results  Component Value Date   WBC 6.6 11/29/2021   HGB 11.7 (L) 11/29/2021   HCT 35.4 (L) 11/29/2021  PLT 165.0 11/29/2021   GLUCOSE 95 01/30/2022   CHOL 107 12/30/2019   TRIG 102.0 12/30/2019   HDL 46.80 12/30/2019   LDLCALC 39 12/30/2019   ALT 9 01/30/2022   AST 20 01/30/2022   NA 137 01/30/2022   K 4.3 01/30/2022   CL 100 01/30/2022   CREATININE 1.02 01/30/2022   BUN 20 01/30/2022   CO2 33 (H) 01/30/2022   TSH 2.26 11/29/2021   PSA 12.45 (H) 02/06/2016   INR 1.1 04/02/2017   HGBA1C 6.1 12/30/2019    DG Chest 2 View  Result Date: 07/25/2021 CLINICAL DATA:  Shortness of breath, chronic systolic heart failure, increased shortness of breath with exertion, history coronary artery disease post MI and CABG, hypertension, CHF EXAM: CHEST - 2 VIEW COMPARISON:  01/24/2021 FINDINGS: LEFT subclavian ICD with leads projecting at RIGHT atrium and RIGHT ventricle. Normal heart size post CABG. Mediastinal contours and pulmonary vascularity normal. Atherosclerotic calcification aorta. Emphysematous and bronchitic changes consistent with COPD. Chronic interstitial lung disease and mid to lower lungs bilaterally. No acute infiltrate, pleural effusion, or pneumothorax. Bones demineralized. IMPRESSION: Post CABG and ICD. COPD changes with basilar chronic interstitial lung disease. No acute abnormalities. Aortic Atherosclerosis (ICD10-I70.0) and Emphysema (ICD10-J43.9). Electronically Signed   By: Lavonia Dana M.D.   On: 07/25/2021 17:57    Assessment & Plan:   Problem List Items Addressed This Visit     Anxiety disorder    Lexapro low dose - start with 1/2 tablet at bedtime Anxiety and depressed mood.  Probable  underlying dementia. Wife fell on 10/28 - ?subdural hemartoma - stressed      CAP (community acquired pneumonia) - Primary   Relevant Orders   DG Chest 2 View   Cough in adult    Mild cough. COPD changes with chronic basilar interstitial disease and suspect superimposed acute infiltrate at RIGHT base.      Dyslipidemia    On Lipitor      Essential hypertension   Relevant Orders   Comprehensive metabolic panel (Completed)   Memory problem    Out of character anger episode. Mood change. Meory loss. Discussed. Cont w/Lexapro qd.  Psychology referral option was discussed.       Paroxysmal atrial fibrillation (HCC)   Relevant Orders   D-dimer, quantitative      No orders of the defined types were placed in this encounter.     Follow-up: Return in about 2 months (around 04/01/2022) for a follow-up visit.  Walker Kehr, MD

## 2022-01-30 NOTE — Assessment & Plan Note (Signed)
Out of character anger episode. Mood change. Meory loss. Discussed. Cont w/Lexapro qd. ? Psychology referral option was discussed.  ?

## 2022-01-30 NOTE — Assessment & Plan Note (Signed)
Lexapro low dose - start with 1/2 tablet at bedtime ?Anxiety and depressed mood.  Probable underlying dementia. ?Wife fell on 10/28 - ?subdural hemartoma - stressed ?

## 2022-01-30 NOTE — Assessment & Plan Note (Signed)
Mild cough. ?COPD changes with chronic basilar interstitial disease and suspect ?superimposed acute infiltrate at RIGHT base. ?

## 2022-01-31 ENCOUNTER — Other Ambulatory Visit: Payer: Self-pay | Admitting: Internal Medicine

## 2022-01-31 ENCOUNTER — Telehealth: Payer: Self-pay | Admitting: *Deleted

## 2022-01-31 ENCOUNTER — Telehealth: Payer: Self-pay | Admitting: Internal Medicine

## 2022-01-31 DIAGNOSIS — R059 Cough, unspecified: Secondary | ICD-10-CM

## 2022-01-31 LAB — D-DIMER, QUANTITATIVE: D-Dimer, Quant: 0.55 mcg/mL FEU — ABNORMAL HIGH (ref <0.50)

## 2022-01-31 NOTE — Telephone Encounter (Signed)
Add note In for referral.  ? ?Need to know if pt needs a PA for upper extremity venous duplex, ordered by Dr. Alain Marion.  ?

## 2022-01-31 NOTE — Telephone Encounter (Signed)
Diane from [facility] calling for a call report on patient chest x ray. Report is available in Epic.  ? ?Thank you  ?

## 2022-01-31 NOTE — Progress Notes (Signed)
Pulmonary

## 2022-01-31 NOTE — Addendum Note (Signed)
Addended by: Cassandria Anger on: 01/31/2022 07:35 AM ? ? Modules accepted: Orders ? ?

## 2022-02-01 NOTE — Telephone Encounter (Signed)
Is it a prior authorization? ?

## 2022-02-01 NOTE — Telephone Encounter (Signed)
It has been addressed.  The patient was referred to see Dr. Melvyn Novas.  Thanks ?

## 2022-02-05 NOTE — Telephone Encounter (Signed)
I have spoken with [name] Christensen and he has stated he is wanting to know if the pt needs Prior approval from his insurance for this to be done? ?

## 2022-02-05 NOTE — Telephone Encounter (Signed)
Called Damita Dunnings and left message stating that a prior approval is not needed for this test.  ?

## 2022-02-14 ENCOUNTER — Ambulatory Visit (HOSPITAL_COMMUNITY)
Admission: RE | Admit: 2022-02-14 | Discharge: 2022-02-14 | Disposition: A | Discharge status: Home / Self Care | Payer: PPO | Source: Ambulatory Visit | Attending: Internal Medicine | Admitting: Internal Medicine

## 2022-02-14 ENCOUNTER — Telehealth: Payer: Self-pay

## 2022-02-14 ENCOUNTER — Other Ambulatory Visit: Payer: Self-pay

## 2022-02-14 DIAGNOSIS — M79601 Pain in right arm: Secondary | ICD-10-CM | POA: Diagnosis not present

## 2022-02-14 NOTE — Telephone Encounter (Signed)
Noted. Thx.

## 2022-02-14 NOTE — Telephone Encounter (Signed)
[name] Christensen with [facility] has called to inform PCP that the pt is negative for DVT in RT arm. ?

## 2022-02-26 ENCOUNTER — Telehealth: Payer: Self-pay | Admitting: Internal Medicine

## 2022-02-26 NOTE — Telephone Encounter (Signed)
LVM for pt to rtn my call to schedule AWV with [facility]. Please schedule if pt calls the office.  ?

## 2022-02-28 DIAGNOSIS — N401 Enlarged prostate with lower urinary tract symptoms: Secondary | ICD-10-CM | POA: Diagnosis not present

## 2022-02-28 DIAGNOSIS — R972 Elevated prostate specific antigen [PSA]: Secondary | ICD-10-CM | POA: Diagnosis not present

## 2022-03-02 ENCOUNTER — Other Ambulatory Visit: Payer: Self-pay | Admitting: Internal Medicine

## 2022-03-04 NOTE — Telephone Encounter (Signed)
Please contact pt for future appointment. Pt due for 6 month f/u. 

## 2022-03-13 ENCOUNTER — Other Ambulatory Visit: Payer: Self-pay | Admitting: Dermatology

## 2022-03-13 DIAGNOSIS — S40862A Insect bite (nonvenomous) of left upper arm, initial encounter: Secondary | ICD-10-CM

## 2022-03-19 ENCOUNTER — Institutional Professional Consult (permissible substitution): Payer: PPO | Admitting: Internal Medicine

## 2022-03-21 ENCOUNTER — Other Ambulatory Visit: Payer: Self-pay | Admitting: Internal Medicine

## 2022-03-22 DIAGNOSIS — M6281 Muscle weakness (generalized): Secondary | ICD-10-CM | POA: Diagnosis not present

## 2022-03-25 ENCOUNTER — Ambulatory Visit (INDEPENDENT_AMBULATORY_CARE_PROVIDER_SITE_OTHER): Payer: PPO | Admitting: Internal Medicine

## 2022-03-25 ENCOUNTER — Encounter: Payer: Self-pay | Admitting: Internal Medicine

## 2022-03-25 ENCOUNTER — Telehealth: Payer: Self-pay | Admitting: Internal Medicine

## 2022-03-25 DIAGNOSIS — J479 Bronchiectasis, uncomplicated: Secondary | ICD-10-CM | POA: Diagnosis not present

## 2022-03-25 DIAGNOSIS — I5042 Chronic combined systolic (congestive) and diastolic (congestive) heart failure: Secondary | ICD-10-CM

## 2022-03-25 DIAGNOSIS — R058 Other specified cough: Secondary | ICD-10-CM | POA: Diagnosis not present

## 2022-03-25 MED ORDER — VALSARTAN 80 MG PO TABS: ORAL_TABLET | ORAL | 11 refills | Status: DC

## 2022-03-25 NOTE — Telephone Encounter (Signed)
Patient went to see Dr. Selinda Orion today, and he change the entresto to valsartan 80 mg.  He wanted to make Dr. [name] aware of that, he would like someone to call him back.  ?

## 2022-03-25 NOTE — Patient Instructions (Addendum)
For cough > mucinex dm  1200 mg every 12 hours as needed ? ?Stop entresto and replace valsartan 80 mg twice daily  ? ? ?Try prilosec otc '20mg'$   Take 30-60 min before first meal of the day and Pepcid ac (famotidine) 20 mg one @  bedtime until cough is completely gone for at least a week without the need for cough suppression ? ?GERD (REFLUX)  is an extremely common cause of respiratory symptoms just like yours , many times with no obvious heartburn at all.  ? ? It can be treated with medication, but also with lifestyle changes including elevation of the head of your bed (ideally with 6 -8inch blocks under the headboard of your bed),  Smoking cessation, avoidance of late meals, excessive alcohol, and avoid fatty foods, chocolate, peppermint, colas, red wine, and acidic juices such as orange juice.  ?NO MINT OR MENTHOL PRODUCTS SO NO COUGH DROPS  ?USE SUGARLESS CANDY INSTEAD (Jolley ranchers or Stover's or Life Savers) or even ice chips will also do - the key is to swallow to prevent all throat clearing. ?NO OIL BASED VITAMINS - use powdered substitutes.  Avoid fish oil when coughing.  ? ?   ?Please schedule a follow up office visit in 6 weeks, call sooner if needed in [place] office  ?Late Add:  recheck K in one week /HRCT before next ov  ?

## 2022-03-25 NOTE — Progress Notes (Signed)
?Subjective:  ? ?Patient ID: [name] Christensen, male    DOB: 04/09/1932  MRN: [id number] ? ? ?Brief patient profile:  ?76 yowm never smoker s/p cardiac arrest Oct 31 2012 requiring artic sun and ever since Nov 10 2012  placement of debrillator every deep breath makes him cough> eval with ? pna 12/14/12  > rx levaquin some better  referred 12/16/2012 to pulmonary clinic by Dr Posey Rea with dx of bronchiectasis on ct chest 02/04/18  ? ? ?History of Present Illness  ?12/16/2012 1st pulmonary eval cc intermittent chills, severe cough since Dec 17 when takes breath in started levquin 1/20with ? pna and starting to feel better, sob only with exertion, no excess mucus production. ?rec ?Take zantac (ranitidine) 150 mg after bfast and after supper whether you think you need it or not for now ?GERD  ?For cough use tramadol 50 mg one every 4 hours if needed  ? ?  ?01/28/2013 f/u ov/[name] Christensen cc completely back to baseline, no cough or sob, not taking any cough meds for a week, still on the zantac at hs. ?rec ?Don't stop the zantac until 100% better for at least a week without the need for any cough medication. ? ? ? ? 04/10/2018  f/u ov/[name] Christensen re: abn ct c/w bronchiectasis  ?Chief Complaint  ?Patient presents with  ? Follow-up  ?  Breathing is markedly improved since last OV. Denies chest tightness, wheezing, SOB or coughing at this time.  ?Dyspnea:  Work out at gym 2.5-2.8 mph at [employer] x one mile  ?Cough: none ?Sleep: lie flat hs  now ?Leg swelling resolved  ?Rec ?Follow up is prn ? ? ? ?03/25/2022  Re-establish ov/[name] Christensen re: bronchiectasis/ cough out of control on entresto since  08/15/20 and really bad cough since first of year 2023 no better with rx for CAP by Plotnikov  ?Chief Complaint  ?Patient presents with  ? Consult  ?  Cough  ?Dyspnea:  still  working doing treadmill  x one mile x 24  min slt elevation  ?Cough: worst 1st thing in am and at hs  > min white phlegm ?Sleeping: flat bed and one pillow no noct  resp cc  ?SABA use:  none ?02: none  ?Covid status:   vax x 3  ? ? ?No obvious day to day or daytime variability or assoc excess/ purulent sputum or mucus plugs or hemoptysis or cp or chest tightness, subjective wheeze or overt sinus or hb symptoms.  ? ?Sleeping  without nocturnal  or early am exacerbation  of respiratory  c/o's or need for noct saba. Also denies any obvious fluctuation of symptoms with weather or environmental changes or other aggravating or alleviating factors except as outlined above  ? ?No unusual exposure hx or h/o childhood pna/ asthma or knowledge of premature birth. ? ?Current Allergies, Complete Past Medical History, Past Surgical History, Family History, and Social History were reviewed in [employer] record. ? ?ROS  The following are not active complaints unless bolded ?Hoarseness, sore throat, dysphagia, dental problems, itching, sneezing,  nasal congestion or discharge of excess mucus or purulent secretions, ear ache,   fever, chills, sweats, unintended wt loss or wt gain, classically pleuritic or exertional cp,  orthopnea pnd or arm/hand swelling  or leg swelling, presyncope, palpitations, abdominal pain, anorexia, nausea, vomiting, diarrhea  or change in bowel habits or change in bladder habits, change in stools or change in urine, dysuria, hematuria,  rash, arthralgias, visual complaints, headache, numbness,  weakness or ataxia or problems with walking or coordination,  change in mood or  memory. ?      ? ?Current Meds  ?Medication Sig  ? acetaminophen (TYLENOL) 500 MG tablet Take 500 mg by mouth at bedtime.  ? alfuzosin (UROXATRAL) 10 MG 24 hr tablet Take 10 mg by mouth every evening.   ? atorvastatin (LIPITOR) 20 MG tablet TAKE 1 TABLET BY MOUTH ONCE DAILY  ? benzonatate (TESSALON) 100 MG capsule benzonatate 100 mg capsule ? TAKE 1 CAPSULE (100 MG TOTAL) BY MOUTH EVERY 8 (EIGHT) HOURS  ? betamethasone dipropionate 0.05 % cream APPLY TOPICALLY TO THE AFFECTED AREA TWOTIMES DAILY  ?  cefdinir (OMNICEF) 300 MG capsule cefdinir 300 mg capsule  ? cinacalcet (SENSIPAR) 30 MG tablet Take 1 tablet (30 mg total) by mouth 3 (three) times a week.  ? dofetilide (TIKOSYN) 250 MCG capsule TAKE ONE CAPSULE BY MOUTH TWICE A DAY  ? dutasteride (AVODART) 0.5 MG capsule Take 0.5 mg by mouth every evening.   ? ELIQUIS 5 MG TABS tablet TAKE 1 TABLET BY MOUTH TWICE (2) DAILY  ? escitalopram (LEXAPRO) 10 MG tablet TAKE 1 TABLET BY MOUTH ONCE DAILY  ? HYDROcodone bit-homatropine (HYCODAN) 5-1.5 MG/5ML syrup TAKE 5 MLS BY MOUTH EVERY 6 HOURS AS NEEDED FOR COUGH  ? linaclotide (LINZESS) 290 MCG CAPS capsule Take 1 capsule (290 mcg total) by mouth daily as needed (constipation).  ? metoprolol succinate (TOPROL-XL) 50 MG 24 hr tablet TAKE 1 TABLET BY MOUTH TWICE A DAY. TAKEWITH OR IMMEDIATELY FOLLOWING A MEAL  ? Multiple Vitamin (MULTIVITAMIN WITH MINERALS) TABS tablet Take 1 tablet by mouth every other day.  ? nitroGLYCERIN (NITROSTAT) 0.4 MG SL tablet DISSOLVE 1 TABLET UNDER TONGUE AS NEEDEDFOR CHEST PAIN. MAY REPEAT 5 MINUTES APART 3 TIMES IF NEEDED  ? Polyethyl Glycol-Propyl Glycol (SYSTANE OP) Place 1 drop into both eyes daily as needed (burning eyes).   ? promethazine-dextromethorphan (PROMETHAZINE-DM) 6.25-15 MG/5ML syrup promethazine-DM 6.25 mg-15 mg/5 mL oral syrup ? TAKE 2.5 MLS BY MOUTH 4 (FOUR) TIMES DAILY AS NEEDED FOR COUGH.  ? sacubitril-valsartan (ENTRESTO) 24-26 MG Take 1 tablet by mouth 2 (two) times daily. PLEASE SCHEDULE OFFICE VISIT FOR FURTHER REFILLS. THANK YOU!  ? triamcinolone cream (KENALOG) 0.1 % APPLY 1 APPLICATION TOPICALLY TWO TIMES DAILY AS NEEDED AVOIDING FACE, GROIN ANDARMPITS  ? vitamin B-12 (CYANOCOBALAMIN) 1000 MCG tablet Take 1,000 mcg by mouth daily.  ? [DISCONTINUED] nitroGLYCERIN (NITROSTAT) 0.4 MG SL tablet DISSOLVE ONE TABLET UNDER THE TONGUE EVERY 5 MINUTES AS NEEDED FOR ACUTE ATTACK OF ANGINA REPEAT EVERY 5 MINUTES UP TO 3 DOSES. IF NO RELIEF SEEK MEDICAL HELP.(DO NOT TAKE  CIALIS, VIAGRA OR LEVITRA WHILE YOU ARE ON THIS MEDICATION ).  ?    ? ?  ?  ?       ? ?  ? ?   ?Objective:  ? Physical Exam ? ?Wts ? ?03/25/2022      136 ? 04/10/2018   146  ? 12/31/2012     146   ?   ?12/16/12 145 lb 3.2 oz (65.862 kg)  ?12/14/12 145 lb (65.772 kg)  ?12/12/2012 144 lb (65.318 kg)  ?  ?  ?Vital signs reviewed  03/25/2022  - Note at rest 02 sats  98% on RA  ? ?General appearance:    amb thin pleasant wm nad ? ? ?HEENT : nl  exam  ? ?NECK :  without JVD/Nodes/TM/ nl carotid upstrokes bilaterally ? ? ?LUNGS: no acc  muscle use,  Min barrel  contour chest wall with bilateral  slightly coarsened BS  and  without cough on insp or exp maneuvers and min  Hyperresonant  to  percussion bilaterally   ? ? ?CV:  RRR  no s3 or murmur or increase in P2, and no edema  ? ?ABD:  soft and nontender with pos end  insp Hoover's  in the supine position. No bruits or organomegaly appreciated, bowel sounds nl ? ?MS:   Nl gait/  ext warm without deformities, calf tenderness, cyanosis or clubbing ?No obvious joint restrictions  ? ?SKIN: warm and dry without lesions   ? ?NEURO:  alert, approp, nl sensorium with  no motor or cerebellar deficits apparent.  ?     ?  ?  ?I personally reviewed images and agree with radiology impression as follows:  ? PA and lat cxr   01/30/22  ?1. Stable chronic appearing bibasilar infiltrates with likely ?superimposed right lower lobe airspace disease/infection. Continued ?follow-up recommended to confirm resolution. Consider follow-up ?chest CT  RX by Plotnikov ?   ?   ?Assessment & Plan:  ? ?

## 2022-03-26 ENCOUNTER — Telehealth: Payer: Self-pay | Admitting: *Deleted

## 2022-03-26 ENCOUNTER — Encounter: Payer: Self-pay | Admitting: Internal Medicine

## 2022-03-26 DIAGNOSIS — I5042 Chronic combined systolic (congestive) and diastolic (congestive) heart failure: Secondary | ICD-10-CM | POA: Insufficient documentation

## 2022-03-26 DIAGNOSIS — R058 Other specified cough: Secondary | ICD-10-CM | POA: Insufficient documentation

## 2022-03-26 DIAGNOSIS — J479 Bronchiectasis, uncomplicated: Secondary | ICD-10-CM

## 2022-03-26 HISTORY — DX: Other specified cough: R05.8

## 2022-03-26 NOTE — Assessment & Plan Note (Signed)
Onset ? Jan 2023  ?- try off Entresto  03/25/2022  ? ?Upper airway cough syndrome (previously labeled PNDS),  is so named because it's frequently impossible to sort out how much is  CR/sinusitis with freq throat clearing (which can be related to primary GERD)   vs  causing  secondary (" extra esophageal")  GERD from wide swings in gastric pressure that occur with throat clearing, often  promoting self use of mint and menthol lozenges that reduce the lower esophageal sphincter tone and exacerbate the problem further in a cyclical fashion.  ? ?These are the same pts (now being labeled as having "irritable larynx syndrome" by some cough centers) who not infrequently have a history of having failed to tolerate ace inhibitors or entresto (see below) ,  dry powder inhalers or biphosphonates or report having atypical/extraesophageal reflux symptoms that don't respond to standard doses of PPI  and are easily confused as having aecopd or asthma flares by even experienced allergists/ pulmonologists (myself included).  ? ?rec ?Try max rx for gerd and off entresto then f/u in [place] in 6 weeks to regroup.  ?

## 2022-03-26 NOTE — Telephone Encounter (Signed)
Called the pt and there was no answer- LMTCB  ? ?Lab order placed and will hold HRCT order until I reach the pt.  ?

## 2022-03-26 NOTE — Telephone Encounter (Signed)
Spoke with pt who reports he saw Dr Melvyn Novas, pulmonology yesterday and it was recommended pt stop Entresto and trial Valsartan '80mg'$  bid for a few weeks to see if he has improvement in his cough.  Pt also states Dr Alain Marion, PCP recommends pt decrease Eliquis to 2.'5mg'$  bid.   ?Pt advised will forward information to Dr [name] and our anti-coag clinic to make them aware and will contact pt with any further recommendations.  Pt verbalizes understanding and agrees with current plan. ?

## 2022-03-26 NOTE — Telephone Encounter (Signed)
-----   Message from Tanda Rockers, MD sent at 03/26/2022  6:45 AM EDT ----- ?Needs hrct before next ov and a bmet in one week - may be able to do same day [facility] location preferred  ? ?Dx bronchiectasis  ? ?

## 2022-03-26 NOTE — Assessment & Plan Note (Addendum)
Echo 08/23/21  ??1. Left ventricular ejection fraction, by estimation, is 40 to 45%. The  ?left ventricle has mildly decreased function. The left ventricle  ?demonstrates global hypokinesis. Left ventricular diastolic parameters are  ?consistent with Grade I diastolic  Dysfunction  ??2. Right ventricular systolic function is normal. The right ventricular  ?size is normal. There is normal pulmonary artery systolic pressure. The  ?estimated right ventricular systolic pressure is 92.1 mmHg.  ??3. The mitral valve is normal in structure. Mild mitral valve  ?regurgitation. No evidence of mitral stenosis.  ??4. The aortic valve was not well visualized. Aortic valve regurgitation  ?is not visualized. Mild to moderate aortic valve sclerosis/calcification  ?is present, without any evidence of aortic stenosis.  ??5. The inferior vena cava is normal in size with greater than 50%  ?respiratory variability, suggesting right atrial pressure of 3 mmHg.  ??6. Frequent PVCs noted.  ? ? ?The sacubitrilat component of entresto is not and ACEi but it does lead to higher levels of bradykinin (the culprit in ACEi related cough) because it reduces Neprilysin based clearance of bradykinin. ?The typical symptoms are dry daytime cough (9% per PI) or complaints of a new sensation of globus or excess PNDS.  ? ?>>> given the refractory and atypical nature of his cough, only option here is trial off while careful monitoring x 6 weeks with serial bp/K levels / will notify Dr [name]  ? ?Discussed in detail all the  indications, usual  risks and alternatives  relative to the benefits with patient who agrees to proceed with Rx as outlined.     ? ?    ?  ? ?Each maintenance medication was reviewed in detail including emphasizing most importantly the difference between maintenance and prns and under what circumstances the prns are to be triggered using an action plan format where appropriate. ? ?Total time for H and P, chart review, counseling,  ) and  generating customized AVS unique to this office visit / same day charting = 45 min for pt not seen in over 3 y ?     ?

## 2022-03-26 NOTE — Telephone Encounter (Signed)
I don't know of a direct causal relationship between sacubitril alone and cough, but the incidence of cough with entresto is reported as higher (9%) vs valsartan alone (3%). ?If Dr. [name] is ok with him trial off Entresto, then that's fine. ? ?He is very boaderline for dose reduction of Eliquis, but technically still qualifies for '5mg'$  BID. His weight does not seem to be trending down. Defer to Dr. [name]. ?

## 2022-03-26 NOTE — Assessment & Plan Note (Signed)
See CT chest 02/04/18  ?- PFT's  04/10/2018  FEV1 2.13 (90 % ) ratio 73  p 6 % improvement from saba p nothing prior to study with DLCO  78/85c% corrects to 108  % for alv volume  ? ?Symptoms are not typical of bronchiectasis but needs f/u HRCT before next ov/ advised  ?

## 2022-03-28 NOTE — Telephone Encounter (Signed)
Patient is aware of below message and voiced his understanding.  ?HTCR and BMET ordered.  ?Nothing further needed.  ? ?

## 2022-03-28 NOTE — Telephone Encounter (Signed)
Spoke with pt and advised Dr [name] would like for him to continue Eliquis '5mg'$  - 1 tablet by mouth twice daily for now.  Pt is due to see dr [name].  Appointment scheduled for 04/23/2022 at 10am in the [place] office at the pt's request.  Pt verbalizes understanding and thanked Therapist, sports for the phone call. ?

## 2022-04-01 ENCOUNTER — Telehealth: Payer: PPO

## 2022-04-02 ENCOUNTER — Ambulatory Visit (INDEPENDENT_AMBULATORY_CARE_PROVIDER_SITE_OTHER): Payer: PPO | Admitting: Internal Medicine

## 2022-04-02 ENCOUNTER — Ambulatory Visit (INDEPENDENT_AMBULATORY_CARE_PROVIDER_SITE_OTHER): Payer: PPO

## 2022-04-02 ENCOUNTER — Encounter: Payer: Self-pay | Admitting: Internal Medicine

## 2022-04-02 VITALS — BP 156/80 | HR 70 | Temp 98.1°F | Ht 67.0 in | Wt 137.0 lb

## 2022-04-02 DIAGNOSIS — Z0000 Encounter for general adult medical examination without abnormal findings: Secondary | ICD-10-CM

## 2022-04-02 DIAGNOSIS — I509 Heart failure, unspecified: Secondary | ICD-10-CM | POA: Insufficient documentation

## 2022-04-02 DIAGNOSIS — R413 Other amnesia: Secondary | ICD-10-CM

## 2022-04-02 DIAGNOSIS — E538 Deficiency of other specified B group vitamins: Secondary | ICD-10-CM | POA: Insufficient documentation

## 2022-04-02 DIAGNOSIS — Z659 Problem related to unspecified psychosocial circumstances: Secondary | ICD-10-CM | POA: Insufficient documentation

## 2022-04-02 DIAGNOSIS — I10 Essential (primary) hypertension: Secondary | ICD-10-CM | POA: Diagnosis not present

## 2022-04-02 DIAGNOSIS — R5382 Chronic fatigue, unspecified: Secondary | ICD-10-CM | POA: Diagnosis not present

## 2022-04-02 DIAGNOSIS — R059 Cough, unspecified: Secondary | ICD-10-CM | POA: Diagnosis not present

## 2022-04-02 DIAGNOSIS — Z950 Presence of cardiac pacemaker: Secondary | ICD-10-CM | POA: Insufficient documentation

## 2022-04-02 DIAGNOSIS — K5904 Chronic idiopathic constipation: Secondary | ICD-10-CM | POA: Insufficient documentation

## 2022-04-02 DIAGNOSIS — H903 Sensorineural hearing loss, bilateral: Secondary | ICD-10-CM | POA: Insufficient documentation

## 2022-04-02 NOTE — Progress Notes (Addendum)
Subjective:   WATT KRAATZ is a 88 y.o. male who presents for Medicare Annual/Subsequent preventive examination.  Review of Systems     Cardiac Risk Factors include: advanced age (>68men, >42 women);dyslipidemia;family history of premature cardiovascular disease;hypertension;male gender     Objective:    Today's Vitals   04/02/22 1056  BP: (!) 156/80  Pulse: 70  Temp: 98.1 F (36.7 C)  SpO2: 95%  Weight: 137 lb (62.1 kg)  Height: 5\' 7"  (1.702 m)  PainSc: 0-No pain   Body mass index is 21.46 kg/m.     04/02/2022   11:01 AM 02/19/2021    9:51 AM 10/25/2019    9:41 AM 07/05/2019    1:02 PM 05/14/2019   10:32 AM 01/04/2019    7:08 AM 09/16/2018   10:52 AM  Advanced Directives  Does Patient Have a Medical Advance Directive? Yes Yes Yes Yes No Yes Yes  Type of Advance Directive Living will;Healthcare Power of [employer] Power of [place];Living will Healthcare Power of [place];Living will Healthcare Power of [place];Living will  Healthcare Power of [place];Living will Healthcare Power of [place];Living will  Does patient want to make changes to medical advance directive? No - Patient declined No - Patient declined  No - Patient declined  No - Patient declined   Copy of Healthcare Power of Attorney in Chart? No - copy requested No - copy requested No - copy requested Yes - validated most recent copy scanned in chart (See row information)  No - copy requested No - copy requested  Would patient like information on creating a medical advance directive?     No - Guardian declined      Current Medications (verified) Outpatient Encounter Medications as of 04/02/2022  Medication Sig   acetaminophen (TYLENOL) 500 MG tablet Take 500 mg by mouth at bedtime.   alfuzosin (UROXATRAL) 10 MG 24 hr tablet Take 10 mg by mouth every evening.    atorvastatin (LIPITOR) 40 MG tablet TAKE ONE-HALF TABLET BY MOUTH AT BEDTIME FOR HIGH CHOLESTEROL   benzonatate (TESSALON) 100 MG capsule  benzonatate 100 mg capsule  TAKE 1 CAPSULE (100 MG TOTAL) BY MOUTH EVERY 8 (EIGHT) HOURS   betamethasone dipropionate 0.05 % cream APPLY TOPICALLY TO THE AFFECTED AREA TWOTIMES DAILY   cefdinir (OMNICEF) 300 MG capsule cefdinir 300 mg capsule   cinacalcet (SENSIPAR) 30 MG tablet Take 1 tablet (30 mg total) by mouth 3 (three) times a week.   dextromethorphan-guaiFENesin (MUCINEX DM) 30-600 MG 12hr tablet Take 1 tablet by mouth 2 (two) times daily.   dofetilide (TIKOSYN) 250 MCG capsule TAKE ONE CAPSULE BY MOUTH TWICE A DAY   dutasteride (AVODART) 0.5 MG capsule Take 0.5 mg by mouth every evening.    ELIQUIS 5 MG TABS tablet TAKE 1 TABLET BY MOUTH TWICE (2) DAILY   escitalopram (LEXAPRO) 10 MG tablet TAKE 1 TABLET BY MOUTH ONCE DAILY   famotidine (PEPCID) 20 MG tablet Take 20 mg by mouth.   HYDROcodone bit-homatropine (HYCODAN) 5-1.5 MG/5ML syrup TAKE 5 MLS BY MOUTH EVERY 6 HOURS AS NEEDED FOR COUGH   metoprolol succinate (TOPROL-XL) 50 MG 24 hr tablet TAKE 1 TABLET BY MOUTH TWICE A DAY. TAKEWITH OR IMMEDIATELY FOLLOWING A MEAL   Multiple Vitamin (MULTIVITAMIN WITH MINERALS) TABS tablet Take 1 tablet by mouth every other day.   nitroGLYCERIN (NITROSTAT) 0.4 MG SL tablet DISSOLVE 1 TABLET UNDER TONGUE AS NEEDEDFOR CHEST PAIN. MAY REPEAT 5 MINUTES APART 3 TIMES IF NEEDED   omeprazole (PRILOSEC)  20 MG capsule Take 20 mg by mouth daily.   Plecanatide 3 MG TABS TAKE ONE TABLET BY MOUTH ONCE EVERY DAY FOR CONSTIPATION *REPLACES LINACLOTIDE (LINZESS)*   Polyethyl Glycol-Propyl Glycol (SYSTANE OP) Place 1 drop into both eyes daily as needed (burning eyes).    promethazine-dextromethorphan (PROMETHAZINE-DM) 6.25-15 MG/5ML syrup promethazine-DM 6.25 mg-15 mg/5 mL oral syrup  TAKE 2.5 MLS BY MOUTH 4 (FOUR) TIMES DAILY AS NEEDED FOR COUGH.   sacubitril-valsartan (ENTRESTO) 49-51 MG TAKE ONE-HALF TABLET BY MOUTH EVERY 12 HOURS *NOTE [facility] PRES[facility] REQUIRES TABLET SPLITTING   triamcinolone cream (KENALOG) 0.1  % APPLY 1 APPLICATION TOPICALLY TWO TIMES DAILY AS NEEDED AVOIDING FACE, GROIN ANDARMPITS   valsartan (DIOVAN) 80 MG tablet One twice daily   vitamin B-12 (CYANOCOBALAMIN) 1000 MCG tablet Take 1,000 mcg by mouth daily.   No facility-administered encounter medications on file as of 04/02/2022.    Allergies (verified) Ezetimibe, Penicillins, Pravastatin sodium, Rosuvastatin, and Niacin   History: Past Medical History:  Diagnosis Date   Actinic keratosis 06/09/2019   L sup forehead at hairline - bx proven   AICD (automatic cardioverter/defibrillator) present 04/07/2017   Allergy    Anxiety    BPH (benign prostatic hypertrophy)    elevated PSA Dr. Lindell Noe Bx 2010   Cardiac arrest [facility])    a. out-of-hospital arrest 11/19/2012 - EF 40-45%, patent grafts on cath, received [employer] AICD.   Carotid disease, bilateral (HCC)    a. 0-39% by doppers.   Cataract    bil cataracts removed   CHF (congestive heart failure) (HCC)    Coronary artery disease    a. s/p MI/CABG 2005. b. s/p cath at time of VF arrest 10/2013 - grafts patent.   Cough    Diverticulosis    Elevated LFTs    a. 10/2012 felt due to cardiac arrest - Hepatitis C Ab reactive from 11/22/2012>>Hep C RNA PCR negative 10/30/2012.    GERD (gastroesophageal reflux disease)    Hyperlipidemia    Hypertension    Internal hemorrhoids    Myocardial infarction (HCC)    2005 - CABG x 5 2005   RBBB    Tubular adenoma of colon    Ventricular bigeminy    a. Event monitor 01/2013: NSR with PVCs and occ bigeminy.   Past Surgical History:  Procedure Laterality Date   APPENDECTOMY     CARDIAC CATHETERIZATION  2007   with patent graft anatomy atretic left internal mammary  artery to the LAD which is nonobstructive. Will restart study June 08, 2007   CARDIOVERSION N/A 03/27/2018   Procedure: CARDIOVERSION;  Surgeon: Iran Ouch, MD;  Location: [facility] ORS;  Service: Cardiovascular;  Laterality: N/A;   CARDIOVERSION N/A 01/04/2019    Procedure: CARDIOVERSION (CATH LAB);  Surgeon: Iran Ouch, MD;  Location: [facility] ORS;  Service: Cardiovascular;  Laterality: N/A;   CARDIOVERSION N/A 07/07/2019   Procedure: CARDIOVERSION;  Surgeon: Wendall Stade, MD;  Location: [facility] ENDOSCOPY;  Service: Cardiovascular;  Laterality: N/A;   CATARACT EXTRACTION W/PHACO Right 04/23/2016   Procedure: CATARACT EXTRACTION PHACO AND INTRAOCULAR LENS PLACEMENT (IOC);  Surgeon: [name], MD;  Location: [facility] ORS;  Service: Ophthalmology;  Laterality: Right;  [place] 1.09 AP% 19.3 CDE 13.38 Fluid pack lot # [id number]   CHOLECYSTECTOMY N/A 06/05/2017   Procedure: LAPAROSCOPIC CHOLECYSTECTOMY WITH INTRAOPERATIVE CHOLANGIOGRAM ERAS PATHWAY POSSIBLE NEEDLE CORE BIOPSY OF LIVER;  Surgeon: [name], MD;  Location: [facility] OR;  Service: General;  Laterality: N/A;  ERAS PATHWAY   COLONOSCOPY  CORONARY ARTERY BYPASS GRAFT  2005   EYE SURGERY     ICD GENERATOR CHANGEOUT N/A 04/07/2017   Procedure: ICD Generator Changeout;  Surgeon: Duke Salvia, MD;  Location: [facility] INVASIVE CV LAB;  Service: Cardiovascular;  Laterality: N/A;   IMPLANTABLE CARDIOVERTER DEFIBRILLATOR IMPLANT N/A 10/27/2012   STJ single chamber ICD implanted by Dr Graciela Husbands for cardiac arrest    INGUINAL HERNIA REPAIR Bilateral 01/17/2015   Procedure: BILATERAL LAPAROSCOPIC INGUINAL HERNIA REPAIR WITH LEFT FEMORAL HERNIA REPAIR;  Surgeon: [name], MD;  Location: [facility] OR;  Service: General;  Laterality: Bilateral;   INSERTION OF MESH Bilateral 01/17/2015   Procedure: INSERTION OF MESH;  Surgeon: [name], MD;  Location: [facility] OR;  Service: General;  Laterality: Bilateral;   LAPAROSCOPIC CHOLECYSTECTOMY  06/05/2017   LEAD INSERTION N/A 04/07/2017   Procedure: RA Lead Insertion;  Surgeon: Duke Salvia, MD;  Location: [facility] INVASIVE CV LAB;  Service: Cardiovascular;  Laterality: N/A;   LEAD REVISION/REPAIR N/A 04/08/2017   Procedure: Atrial Lead Revision/Repair;  Surgeon: Duke Salvia, MD;   Location: [facility] INVASIVE CV LAB;  Service: Cardiovascular;  Laterality: N/A;   LEFT HEART CATH AND CORONARY ANGIOGRAPHY Left 05/14/2019   Procedure: LEFT HEART CATH AND CORONARY ANGIOGRAPHY;  Surgeon: Iran Ouch, MD;  Location: [facility] INVASIVE CV LAB;  Service: Cardiovascular;  Laterality: Left;   LEFT HEART CATHETERIZATION WITH CORONARY/GRAFT ANGIOGRAM N/A 10/30/2012   Procedure: LEFT HEART CATHETERIZATION WITH Isabel Caprice;  Surgeon: [name], MD;  Location: [facility] CATH LAB;  Service: Cardiovascular;  Laterality: N/A;   LIVER BIOPSY N/A 06/05/2017   Procedure: SINGLE SITE LIVER BIOPSY ERAS PATHWAY;  Surgeon: [name], MD;  Location: [facility] OR;  Service: General;  Laterality: N/A;  ERAS PATHWAY   LUMBAR FUSION  09/2007   Family History  Problem Relation Age of Onset   Coronary artery disease Mother    Heart attack Brother    Stomach cancer Neg Hx    Colon cancer Neg Hx    Esophageal cancer Neg Hx    Pancreatic cancer Neg Hx    Prostate cancer Neg Hx    Rectal cancer Neg Hx    Hypercalcemia Neg Hx    Social History   Socioeconomic History   Marital status: Married    Spouse name: Not on file   Number of children: 5   Years of education: Not on file   Highest education level: Not on file  Occupational History   Occupation: Engineer, 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  Tobacco Use   Smoking status: Never   Smokeless tobacco: Never  Vaping Use   Vaping Use: Never used  Substance and Sexual Activity   Alcohol use: No   Drug use: No   Sexual activity: Never  Other Topics Concern   Not on file  Social History Narrative   Not on file   Social Determinants of Health   Financial Resource Strain: Low Risk    Difficulty of Paying Living Expenses: Not hard at all  Food Insecurity: No Food Insecurity   Worried About Programme researcher, broadcasting/film/video in the Last Year: Never true   Ran Out of Food in the Last Year: Never true  Transportation Needs: No Transportation Needs   Lack of  Transportation (Medical): No   Lack of Transportation (Non-Medical): No  Physical Activity: Sufficiently Active   Days of Exercise per Week: 5 days   Minutes of Exercise per Session: 30 min  Stress: No Stress Concern  Present   Feeling of Stress : Not at all  Social Connections: Socially Integrated   Frequency of Communication with Friends and Family: More than three times a week   Frequency of Social Gatherings with Friends and Family: Once a week   Attends Religious Services: More than 4 times per year   Active Member of [employer] or Organizations: No   Attends Engineer, structural: More than 4 times per year   Marital Status: Married    Tobacco Counseling Counseling given: Not Answered   Clinical Intake:  Pre-visit preparation completed: Yes  Pain : No/denies pain Pain Score: 0-No pain     BMI - recorded: 21.46 Nutritional Status: BMI of 19-24  Normal Nutritional Risks: None Diabetes: No  How often do you need to have someone help you when you read instructions, pamphlets, or other written materials from your doctor or pharmacy?: 1 - Never What is the last grade level you completed in school?: Retired [place] Veteran  Diabetic? no  Interpreter Needed?: No  Information entered by :: [employer], LPN.   Activities of Daily Living    04/02/2022   11:03 AM  In your present state of health, do you have any difficulty performing the following activities:  Hearing? 1  Vision? 0  Difficulty concentrating or making decisions? 1  Walking or climbing stairs? 0  Dressing or bathing? 0  Doing errands, shopping? 0  Preparing Food and eating ? N  Using the Toilet? N  In the past six months, have you accidently leaked urine? N  Do you have problems with loss of bowel control? N  Managing your Medications? N  Managing your Finances? N  Housekeeping or managing your Housekeeping? N    Patient Care Team: Plotnikov, Georgina Quint, MD as PCP - General (Internal  Medicine) Wendall Stade, MD as PCP - Cardiology (Cardiology) Duke Salvia, MD as PCP - Electrophysiology (Cardiology) Lindell Noe Lehman Prom., MD (Urology) [name], MD as Consulting Physician (General Surgery) Pyrtle, Carie Caddy, MD as Consulting Physician (Gastroenterology) [name], MD as Referring Physician (Ophthalmology) Michaela Corner, RN as Case Manager Nyoka Cowden, MD as Consulting Physician (Pulmonary Disease) Romero Belling, MD as Consulting Physician (Endocrinology)  Indicate any recent Medical Services you may have received from other than [facility] providers in the past year (date may be approximate).     Assessment:   This is a routine wellness examination for [employer].  Hearing/Vision screen Hearing Screening - Comments:: Patient has hearing difficulty. Hearing aids ordered by Selena Batten. Vision Screening - Comments:: Patient does wear corrective lenses/contacts.  [facility] exam done by: [facility]   Dietary issues and exercise activities discussed: Current Exercise Habits: Home exercise routine, Type of exercise: walking;Other - see comments (Goes to gym every morning), Time (Minutes): 30, Frequency (Times/Week): 5, Weekly Exercise (Minutes/Week): 150, Intensity: Mild, Exercise limited by: cardiac condition(s);orthopedic condition(s)   Goals Addressed             This Visit's Progress    I would like to feel better and eliminate pain & fatigue.        Depression Screen    04/02/2022   10:01 AM 01/30/2022   10:40 AM 12/04/2021    2:00 PM 03/01/2021   10:15 AM 02/19/2021    9:58 AM 01/01/2021   11:28 AM 01/01/2021   11:27 AM  PHQ 2/9 Scores  PHQ - 2 Score 0 0 0 0 0 0 0  PHQ- 9 Score  2   5  0     Fall Risk    04/02/2022   10:01 AM 01/30/2022   10:40 AM 01/02/2022    1:15 PM 12/04/2021    2:00 PM 09/06/2021    2:00 PM  Fall Risk   Falls in the past year? 1 1  1 1   Comment   Denies new/ recent falls since last outreach 12/04/21    Number  falls in past yr: 0 0  0 0  Injury with Fall? 0 0  0 0  Risk for fall due to : No Fall Risks;History of fall(s)  History of fall(s) History of fall(s);Other (Comment) History of fall(s);Other (Comment)  Risk for fall due to: Comment    weakness, post- COVID post-COVID weakness  Follow up Falls evaluation completed  Falls prevention discussed Falls prevention discussed Falls prevention discussed  Comment   patient not currently using assistive devices does not usually use assisitive devices- feels steady on feet with ambulation     FALL RISK PREVENTION PERTAINING TO THE HOME:  Any stairs in or around the home? Yes  If so, are there any without handrails? No  Home free of loose throw rugs in walkways, pet beds, electrical cords, etc? Yes  Adequate lighting in your home to reduce risk of falls? Yes   ASSISTIVE DEVICES UTILIZED TO PREVENT FALLS:  Life alert? No  Use of a cane, walker or w/c? No  Grab bars in the bathroom? No  Shower chair or bench in shower? Yes  Elevated toilet seat or a handicapped toilet? Yes   TIMED UP AND GO:  Was the test performed? Yes .  Length of time to ambulate 10 feet: 6 sec.   Gait steady and fast without use of assistive device  Cognitive Function:    04/02/2022   11:04 AM  MMSE - Mini Mental State Exam  Orientation to time 5  Orientation to Place 5  Registration 3  Attention/ Calculation 5  Recall 3  Language- name 2 objects 2  Language- repeat 1  Language- follow 3 step command 3  Language- read & follow direction 1  Write a sentence 1  Copy design 1  Total score 30        Immunizations Immunization History  Administered Date(s) Administered   Fluad Quad(high Dose 65+) 09/29/2019, 08/21/2020, 09/25/2021   Influenza Whole 09/07/2008, 12/11/2009, 08/26/2011, 09/25/2012   Influenza, High Dose Seasonal PF 08/05/2016, 08/11/2017, 08/17/2018   Influenza,inj,Quad PF,6+ Mos 08/30/2013, 08/03/2014, 08/09/2015   PFIZER(Purple Top)SARS-COV-2  Vaccination 12/27/2019, 01/17/2020, 08/21/2020   Pneumococcal Conjugate-13 01/31/2014   Pneumococcal Polysaccharide-23 09/08/2007, 06/04/2016   Td 09/09/2001, 12/26/2011   Tdap 08/17/2018   Zoster Recombinat (Shingrix) 08/04/2018, 11/05/2018    TDAP status: Up to date  Flu Vaccine status: Up to date  Pneumococcal vaccine status: Up to date  Covid-19 vaccine status: Completed vaccines  Qualifies for Shingles Vaccine? Yes   Zostavax completed No   Shingrix Completed?: Yes  Screening Tests Health Maintenance  Topic Date Due   COVID-19 Vaccine (4 - Booster for Pfizer series) 10/16/2020   INFLUENZA VACCINE  06/25/2022   TETANUS/TDAP  08/17/2028   Pneumonia Vaccine 63+ Years old  Completed   Zoster Vaccines- Shingrix  Completed   HPV VACCINES  Aged Out    Health Maintenance  Health Maintenance Due  Topic Date Due   COVID-19 Vaccine (4 - Booster for Pfizer series) 10/16/2020    Colorectal cancer screening: No longer required.   Lung Cancer  Screening: (Low Dose CT Chest recommended if Age 17-80 years, 30 pack-year currently smoking OR have quit w/in 15years.) does not qualify.   Lung Cancer Screening Referral: no  Additional Screening:  Hepatitis C Screening: does not qualify; Completed: no  Vision Screening: Recommended annual ophthalmology exams for early detection of glaucoma and other disorders of the eye. Is the patient up to date with their annual eye exam?  Yes  Who is the provider or what is the name of the office in which the patient attends annual eye exams? [facility] If pt is not established with a provider, would they like to be referred to a provider to establish care? No .   Dental Screening: Recommended annual dental exams for proper oral hygiene  Community Resource Referral / Chronic Care Management: CRR required this visit?  No   CCM required this visit?  No      Plan:     I have personally reviewed and noted the following in the  patient's chart:   Medical and social history Use of alcohol, tobacco or illicit drugs  Current medications and supplements including opioid prescriptions. Patient is not currently taking opioid prescriptions. Functional ability and status Nutritional status Physical activity Advanced directives List of other physicians Hospitalizations, surgeries, and ER visits in previous 12 months Vitals Screenings to include cognitive, depression, and falls Referrals and appointments  In addition, I have reviewed and discussed with patient certain preventive protocols, quality metrics, and best practice recommendations. A written personalized care plan for preventive services as well as general preventive health recommendations were provided to patient.     Mickeal Needy, LPN   01/29/6439   Nurse Notes:  Hearing Screening - Comments:: Patient has hearing difficulty. Hearing aids ordered by Selena Batten. Vision Screening - Comments:: Patient does wear corrective lenses/contacts.  Eye exam done by: [facility]   Medical screening examination/treatment/procedure(s) were performed by non-physician practitioner and as supervising physician I was immediately available for consultation/collaboration.  I agree with above. Jacinta Shoe, MD

## 2022-04-02 NOTE — Assessment & Plan Note (Signed)
Doing better.   

## 2022-04-02 NOTE — Patient Instructions (Signed)
[name] Christensen , ?Thank you for taking time to come for your Medicare Wellness Visit. I appreciate your ongoing commitment to your health goals. Please review the following plan we discussed and let me know if I can assist you in the future.  ? ?Screening recommendations/referrals: ?Colonoscopy: Discontinued due to age; last done 01/02/2017 ?Recommended yearly ophthalmology/optometry visit for glaucoma screening and checkup ?Recommended yearly dental visit for hygiene and checkup ? ?Vaccinations: ?Influenza vaccine: 09/25/2021; due every Fall Season  ?Pneumococcal vaccine: 01/31/2014, 06/04/2016 ?Tdap vaccine: 08/17/2018; due every 10 years ?Shingles vaccine: 08/04/2018, 11/05/2018   ?Covid-19: 12/27/2019, 01/17/2020, 08/21/2020 ? ?Advanced directives: Yes ? ?Conditions/risks identified: Yes ? ?Next appointment: Please schedule your next Medicare Wellness Visit with Mignon Pine, [place] in 1 year by calling [phone]. ? ?Preventive Care 32 Years and Older, Male ?Preventive care refers to lifestyle choices and visits with your health care provider that can promote health and wellness. ?What does preventive care include? ?A yearly physical exam. This is also called an annual well check. ?Dental exams once or twice a year. ?Routine eye exams. Ask your health care provider how often you should have your eyes checked. ?Personal lifestyle choices, including: ?Daily care of your teeth and gums. ?Regular physical activity. ?Eating a healthy diet. ?Avoiding tobacco and drug use. ?Limiting alcohol use. ?Practicing safe sex. ?Taking low doses of aspirin every day. ?Taking vitamin and mineral supplements as recommended by your health care provider. ?What happens during an annual well check? ?The services and screenings done by your health care provider during your annual well check will depend on your age, overall health, lifestyle risk factors, and family history of disease. ?Counseling  ?Your health care provider may ask you  questions about your: ?Alcohol use. ?Tobacco use. ?Drug use. ?Emotional well-being. ?Home and relationship well-being. ?Sexual activity. ?Eating habits. ?History of falls. ?Memory and ability to understand (cognition). ?Work and work Statistician. ?Screening  ?You may have the following tests or measurements: ?Height, weight, and BMI. ?Blood pressure. ?Lipid and cholesterol levels. These may be checked every 5 years, or more frequently if you are over 2 years old. ?Skin check. ?Lung cancer screening. You may have this screening every year starting at age 92 if you have a 30-pack-year history of smoking and currently smoke or have quit within the past 15 years. ?Fecal occult blood test (FOBT) of the stool. You may have this test every year starting at age 45. ?Flexible sigmoidoscopy or colonoscopy. You may have a sigmoidoscopy every 5 years or a colonoscopy every 10 years starting at age 80. ?Prostate cancer screening. Recommendations will vary depending on your family history and other risks. ?Hepatitis C blood test. ?Hepatitis B blood test. ?Sexually transmitted disease (STD) testing. ?Diabetes screening. This is done by checking your blood sugar (glucose) after you have not eaten for a while (fasting). You may have this done every 1-3 years. ?Abdominal aortic aneurysm (AAA) screening. You may need this if you are a current or former smoker. ?Osteoporosis. You may be screened starting at age 39 if you are at high risk. ?Talk with your health care provider about your test results, treatment options, and if necessary, the need for more tests. ?Vaccines  ?Your health care provider may recommend certain vaccines, such as: ?Influenza vaccine. This is recommended every year. ?Tetanus, diphtheria, and acellular pertussis (Tdap, Td) vaccine. You may need a Td booster every 10 years. ?Zoster vaccine. You may need this after age 45. ?Pneumococcal 13-valent conjugate (PCV13) vaccine. One dose is recommended  after age  52. ?Pneumococcal polysaccharide (PPSV23) vaccine. One dose is recommended after age 7. ?Talk to your health care provider about which screenings and vaccines you need and how often you need them. ?This information is not intended to replace advice given to you by your health care provider. Make sure you discuss any questions you have with your health care provider. ?Document Released: 12/08/2015 Document Revised: 07/31/2016 Document Reviewed: 09/12/2015 ?Elsevier Interactive Patient Education ? 2017 [employer]. ? ?Fall Prevention in the Home ?Falls can cause injuries. They can happen to people of all ages. There are many things you can do to make your home safe and to help prevent falls. ?What can I do on the outside of my home? ?Regularly fix the edges of walkways and driveways and fix any cracks. ?Remove anything that might make you trip as you walk through a door, such as a raised step or threshold. ?Trim any bushes or trees on the path to your home. ?Use bright outdoor lighting. ?Clear any walking paths of anything that might make someone trip, such as rocks or tools. ?Regularly check to see if handrails are loose or broken. Make sure that both sides of any steps have handrails. ?Any raised decks and porches should have guardrails on the edges. ?Have any leaves, snow, or ice cleared regularly. ?Use sand or salt on walking paths during winter. ?Clean up any spills in your garage right away. This includes oil or grease spills. ?What can I do in the bathroom? ?Use night lights. ?Install grab bars by the toilet and in the tub and shower. Do not use towel bars as grab bars. ?Use non-skid mats or decals in the tub or shower. ?If you need to sit down in the shower, use a plastic, non-slip stool. ?Keep the floor dry. Clean up any water that spills on the floor as soon as it happens. ?Remove soap buildup in the tub or shower regularly. ?Attach bath mats securely with double-sided non-slip rug tape. ?Do not have throw  rugs and other things on the floor that can make you trip. ?What can I do in the bedroom? ?Use night lights. ?Make sure that you have a light by your bed that is easy to reach. ?Do not use any sheets or blankets that are too big for your bed. They should not hang down onto the floor. ?Have a firm chair that has side arms. You can use this for support while you get dressed. ?Do not have throw rugs and other things on the floor that can make you trip. ?What can I do in the kitchen? ?Clean up any spills right away. ?Avoid walking on wet floors. ?Keep items that you use a lot in easy-to-reach places. ?If you need to reach something above you, use a strong step stool that has a grab bar. ?Keep electrical cords out of the way. ?Do not use floor polish or wax that makes floors slippery. If you must use wax, use non-skid floor wax. ?Do not have throw rugs and other things on the floor that can make you trip. ?What can I do with my stairs? ?Do not leave any items on the stairs. ?Make sure that there are handrails on both sides of the stairs and use them. Fix handrails that are broken or loose. Make sure that handrails are as long as the stairways. ?Check any carpeting to make sure that it is firmly attached to the stairs. Fix any carpet that is loose or worn. ?Avoid having throw  rugs at the top or bottom of the stairs. If you do have throw rugs, attach them to the floor with carpet tape. ?Make sure that you have a light switch at the top of the stairs and the bottom of the stairs. If you do not have them, ask someone to add them for you. ?What else can I do to help prevent falls? ?Wear shoes that: ?Do not have high heels. ?Have rubber bottoms. ?Are comfortable and fit you well. ?Are closed at the toe. Do not wear sandals. ?If you use a stepladder: ?Make sure that it is fully opened. Do not climb a closed stepladder. ?Make sure that both sides of the stepladder are locked into place. ?Ask someone to hold it for you, if  possible. ?Clearly mark and make sure that you can see: ?Any grab bars or handrails. ?First and last steps. ?Where the edge of each step is. ?Use tools that help you move around (mobility aids) if they are needed. The

## 2022-04-02 NOTE — Assessment & Plan Note (Signed)
Better off Entresto: cough is 25% better per pt. It is much better (by 50%) per wife [name]. ?On Valsartan 80 mg bid now ?F/u w/Dr Melvyn Novas ?We may need to increase Valsartan dose due to high BP ?

## 2022-04-02 NOTE — Progress Notes (Signed)
? ?Subjective:  ?Patient ID: DELLAS GUARD, male    DOB: 1932/07/14  Age: 88 y.o. MRN: [id number] ? ?CC: No chief complaint on file. ? ? ?HPI ?Jonathandavid E Swart presents for cough - cough is 25% better per pt. It is much better (by 50%) per [name]. ? ?F/u Memory problems, depressed mood, HTN ? ?Outpatient Medications Prior to Visit  ?Medication Sig Dispense Refill  ? acetaminophen (TYLENOL) 500 MG tablet Take 500 mg by mouth at bedtime.    ? alfuzosin (UROXATRAL) 10 MG 24 hr tablet Take 10 mg by mouth every evening.     ? atorvastatin (LIPITOR) 40 MG tablet TAKE ONE-HALF TABLET BY MOUTH AT BEDTIME FOR HIGH CHOLESTEROL    ? benzonatate (TESSALON) 100 MG capsule benzonatate 100 mg capsule ? TAKE 1 CAPSULE (100 MG TOTAL) BY MOUTH EVERY 8 (EIGHT) HOURS    ? betamethasone dipropionate 0.05 % cream APPLY TOPICALLY TO THE AFFECTED AREA TWOTIMES DAILY 30 g 1  ? cefdinir (OMNICEF) 300 MG capsule cefdinir 300 mg capsule    ? cinacalcet (SENSIPAR) 30 MG tablet Take 1 tablet (30 mg total) by mouth 3 (three) times a week. 40 tablet 3  ? dextromethorphan-guaiFENesin (MUCINEX DM) 30-600 MG 12hr tablet Take 1 tablet by mouth 2 (two) times daily.    ? dofetilide (TIKOSYN) 250 MCG capsule TAKE ONE CAPSULE BY MOUTH TWICE A DAY 180 capsule 2  ? dutasteride (AVODART) 0.5 MG capsule Take 0.5 mg by mouth every evening.     ? ELIQUIS 5 MG TABS tablet TAKE 1 TABLET BY MOUTH TWICE (2) DAILY 180 tablet 3  ? escitalopram (LEXAPRO) 10 MG tablet TAKE 1 TABLET BY MOUTH ONCE DAILY 30 tablet 5  ? famotidine (PEPCID) 20 MG tablet Take 20 mg by mouth.    ? HYDROcodone bit-homatropine (HYCODAN) 5-1.5 MG/5ML syrup TAKE 5 MLS BY MOUTH EVERY 6 HOURS AS NEEDED FOR COUGH 240 mL 0  ? metoprolol succinate (TOPROL-XL) 50 MG 24 hr tablet TAKE 1 TABLET BY MOUTH TWICE A DAY. TAKEWITH OR IMMEDIATELY FOLLOWING A MEAL 180 tablet 3  ? Multiple Vitamin (MULTIVITAMIN WITH MINERALS) TABS tablet Take 1 tablet by mouth every other day.    ? nitroGLYCERIN (NITROSTAT) 0.4 MG  SL tablet DISSOLVE 1 TABLET UNDER TONGUE AS NEEDEDFOR CHEST PAIN. MAY REPEAT 5 MINUTES APART 3 TIMES IF NEEDED 25 tablet 3  ? omeprazole (PRILOSEC) 20 MG capsule Take 20 mg by mouth daily.    ? Plecanatide 3 MG TABS TAKE ONE TABLET BY MOUTH ONCE EVERY DAY FOR CONSTIPATION *REPLACES LINACLOTIDE Rolan Lipa)*    ? Polyethyl Glycol-Propyl Glycol (SYSTANE OP) Place 1 drop into both eyes daily as needed (burning eyes).     ? promethazine-dextromethorphan (PROMETHAZINE-DM) 6.25-15 MG/5ML syrup promethazine-DM 6.25 mg-15 mg/5 mL oral syrup ? TAKE 2.5 MLS BY MOUTH 4 (FOUR) TIMES DAILY AS NEEDED FOR COUGH.    ? sacubitril-valsartan (ENTRESTO) 49-51 MG TAKE ONE-HALF TABLET BY MOUTH EVERY 12 HOURS *NOTE [facility] REQUIRES TABLET SPLITTING    ? triamcinolone cream (KENALOG) 0.1 % APPLY 1 APPLICATION TOPICALLY TWO TIMES DAILY AS NEEDED AVOIDING FACE, GROIN ANDARMPITS 80 g 0  ? valsartan (DIOVAN) 80 MG tablet One twice daily 60 tablet 11  ? vitamin B-12 (CYANOCOBALAMIN) 1000 MCG tablet Take 1,000 mcg by mouth daily.    ? atorvastatin (LIPITOR) 20 MG tablet TAKE 1 TABLET BY MOUTH ONCE DAILY 90 tablet 3  ? linaclotide (LINZESS) 290 MCG CAPS capsule Take 1 capsule (290 mcg total) by mouth  daily as needed (constipation). 90 capsule 3  ? ?No facility-administered medications prior to visit.  ? ? ?ROS: ?Review of Systems  ?Constitutional:  Negative for appetite change, fatigue and unexpected weight change.  ?HENT:  Negative for congestion, nosebleeds, sneezing, sore throat and trouble swallowing.   ?Eyes:  Negative for itching and visual disturbance.  ?Respiratory:  Negative for cough.   ?Cardiovascular:  Negative for chest pain, palpitations and leg swelling.  ?Gastrointestinal:  Negative for abdominal distention, blood in stool, diarrhea and nausea.  ?Genitourinary:  Negative for frequency and hematuria.  ?Musculoskeletal:  Negative for back pain, gait problem, joint swelling and neck pain.  ?Skin:  Negative for rash.   ?Neurological:  Negative for dizziness, tremors, speech difficulty and weakness.  ?Psychiatric/Behavioral:  Negative for agitation, dysphoric mood and sleep disturbance. The patient is not nervous/anxious.   ? ?Objective:  ?BP (!) 156/80 (BP Location: Left Arm, Patient Position: Sitting)   Pulse 70   Temp 98.1 ?F (36.7 ?C) (Oral)   Ht '5\' 7"'$  (1.702 m)   Wt 137 lb (62.1 kg)   SpO2 95%   BMI 21.46 kg/m?  ? ?BP Readings from Last 3 Encounters:  ?04/02/22 (!) 156/80  ?03/25/22 112/64  ?01/30/22 116/72  ? ? ?Wt Readings from Last 3 Encounters:  ?04/02/22 137 lb (62.1 kg)  ?03/25/22 136 lb 9.6 oz (62 kg)  ?01/30/22 134 lb 9.6 oz (61.1 kg)  ? ? ?Physical Exam ?Constitutional:   ?   General: He is not in acute distress. ?   Appearance: Normal appearance. He is well-developed.  ?   Comments: NAD  ?Eyes:  ?   Conjunctiva/sclera: Conjunctivae normal.  ?   Pupils: Pupils are equal, round, and reactive to light.  ?Neck:  ?   Thyroid: No thyromegaly.  ?   Vascular: No JVD.  ?Cardiovascular:  ?   Rate and Rhythm: Normal rate and regular rhythm.  ?   Heart sounds: Normal heart sounds. No murmur heard. ?  No friction rub. No gallop.  ?Pulmonary:  ?   Effort: Pulmonary effort is normal. No respiratory distress.  ?   Breath sounds: Normal breath sounds. No wheezing or rales.  ?Chest:  ?   Chest wall: No tenderness.  ?Abdominal:  ?   General: Bowel sounds are normal. There is no distension.  ?   Palpations: Abdomen is soft. There is no mass.  ?   Tenderness: There is no abdominal tenderness. There is no guarding or rebound.  ?Musculoskeletal:     ?   General: No tenderness. Normal range of motion.  ?   Cervical back: Normal range of motion.  ?Lymphadenopathy:  ?   Cervical: No cervical adenopathy.  ?Skin: ?   General: Skin is warm and dry.  ?   Findings: No rash.  ?Neurological:  ?   Mental Status: He is alert and oriented to person, place, and time.  ?   Cranial Nerves: No cranial nerve deficit.  ?   Motor: No abnormal muscle  tone.  ?   Coordination: Coordination normal.  ?   Gait: Gait normal.  ?   Deep Tendon Reflexes: Reflexes are normal and symmetric.  ?Psychiatric:     ?   Behavior: Behavior normal.     ?   Thought Content: Thought content normal.     ?   Judgment: Judgment normal.  ? ? ?Lab Results  ?Component Value Date  ? WBC 6.6 11/29/2021  ? HGB 11.7 (L) 11/29/2021  ?  HCT 35.4 (L) 11/29/2021  ? PLT 165.0 11/29/2021  ? GLUCOSE 95 01/30/2022  ? CHOL 107 12/30/2019  ? TRIG 102.0 12/30/2019  ? HDL 46.80 12/30/2019  ? [place] 39 12/30/2019  ? ALT 9 01/30/2022  ? AST 20 01/30/2022  ? NA 137 01/30/2022  ? K 4.3 01/30/2022  ? CL 100 01/30/2022  ? CREATININE 1.02 01/30/2022  ? BUN 20 01/30/2022  ? CO2 33 (H) 01/30/2022  ? TSH 2.26 11/29/2021  ? PSA 12.45 (H) 02/06/2016  ? INR 1.1 04/02/2017  ? HGBA1C 6.1 12/30/2019  ? ? ?VAS [place] UPPER EXTREMITY VENOUS DUPLEX ? ?Result Date: 02/14/2022 ?UPPER VENOUS STUDY  Patient Name:  NORIS KULINSKI  Date of Exam:   02/14/2022 Medical Rec #: [id number]       Accession #:    [id number] Date of Birth: Jun 19, 1932      Patient Gender: M Patient Age:   76 years Exam Location:  [facility] Procedure:      VAS [place] UPPER EXTREMITY VENOUS DUPLEX Referring Phys: Tyrone Apple Niti Leisure --------------------------------------------------------------------------------  Indications: Hard bump on right arm. Patient states that this spot has been there for a while and is not painful, but concerned about possible blood clot. Anticoagulation: Eliquis.  Comparison Study: None Performing Technologist: Ivan Croft  Examination Guidelines: A complete evaluation includes B-mode imaging, spectral Doppler, color Doppler, and power Doppler as needed of all accessible portions of each vessel. Bilateral testing is considered an integral part of a complete examination. Limited examinations for reoccurring indications may be performed as noted.  Right Findings:  +----------+------------+---------+-----------+----------+-------+ RIGHT     CompressiblePhasicitySpontaneousPropertiesSummary +----------+------------+---------+-----------+----------+-------+ IJV           Full       Yes       Yes                      +----------+------------+---

## 2022-04-02 NOTE — Assessment & Plan Note (Addendum)
Worse off Entresto ?On Valsartan 80 mg bid now ?We may need to increase Valsartan dose ?

## 2022-04-02 NOTE — Assessment & Plan Note (Signed)
On B12 

## 2022-04-02 NOTE — Assessment & Plan Note (Signed)
He is very active for his age ?

## 2022-04-03 ENCOUNTER — Ambulatory Visit (INDEPENDENT_AMBULATORY_CARE_PROVIDER_SITE_OTHER): Payer: PPO

## 2022-04-03 DIAGNOSIS — I469 Cardiac arrest, cause unspecified: Secondary | ICD-10-CM | POA: Diagnosis not present

## 2022-04-04 ENCOUNTER — Other Ambulatory Visit
Admission: RE | Admit: 2022-04-04 | Discharge: 2022-04-04 | Disposition: A | Discharge status: Home / Self Care | Payer: PPO | Attending: Internal Medicine | Admitting: Internal Medicine

## 2022-04-04 DIAGNOSIS — J479 Bronchiectasis, uncomplicated: Secondary | ICD-10-CM | POA: Insufficient documentation

## 2022-04-04 LAB — CUP PACEART REMOTE DEVICE CHECK
Battery Remaining Longevity: 36 mo
Battery Remaining Percentage: 41 %
Battery Voltage: 2.92 V
Brady Statistic AP VP Percent: 1 %
Brady Statistic AP VS Percent: 51 %
Brady Statistic AS VP Percent: 7.4 %
Brady Statistic AS VS Percent: 40 %
Brady Statistic RA Percent Paced: 46 %
Brady Statistic RV Percent Paced: 7.5 %
Date Time Interrogation Session: 20230510020017
HighPow Impedance: 64 Ohm
HighPow Impedance: 64 Ohm
Implantable Lead Implant Date: 20131218
Implantable Lead Implant Date: 20180514
Implantable Lead Location: 753859
Implantable Lead Location: 753860
Implantable Lead Model: 5076
Implantable Pulse Generator Implant Date: 20180514
Lead Channel Impedance Value: 400 Ohm
Lead Channel Impedance Value: 450 Ohm
Lead Channel Pacing Threshold Amplitude: 0.5 V
Lead Channel Pacing Threshold Amplitude: 1.25 V
Lead Channel Pacing Threshold Pulse Width: 0.5 ms
Lead Channel Pacing Threshold Pulse Width: 0.5 ms
Lead Channel Sensing Intrinsic Amplitude: 2.8 mV
Lead Channel Sensing Intrinsic Amplitude: 4.6 mV
Lead Channel Setting Pacing Amplitude: 2 V
Lead Channel Setting Pacing Amplitude: 2.5 V
Lead Channel Setting Pacing Pulse Width: 0.5 ms
Lead Channel Setting Sensing Sensitivity: 0.5 mV
Pulse Gen Serial Number: 1245762

## 2022-04-04 LAB — BASIC METABOLIC PANEL
Anion gap: 3 — ABNORMAL LOW (ref 5–15)
BUN: 21 mg/dL (ref 8–23)
CO2: 29 mmol/L (ref 22–32)
Calcium: 10.1 mg/dL (ref 8.9–10.3)
Chloride: 102 mmol/L (ref 98–111)
Creatinine, Ser: 1.2 mg/dL (ref 0.61–1.24)
GFR, Estimated: 59 mL/min — ABNORMAL LOW (ref >60)
Glucose, Bld: 92 mg/dL (ref 70–99)
Potassium: 4.1 mmol/L (ref 3.5–5.1)
Sodium: 134 mmol/L — ABNORMAL LOW (ref 135–145)

## 2022-04-05 DIAGNOSIS — H43813 Vitreous degeneration, bilateral: Secondary | ICD-10-CM | POA: Diagnosis not present

## 2022-04-12 ENCOUNTER — Other Ambulatory Visit: Payer: Self-pay | Admitting: Endocrinology

## 2022-04-12 NOTE — Progress Notes (Signed)
Remote ICD transmission.   

## 2022-04-16 ENCOUNTER — Other Ambulatory Visit: Payer: Self-pay | Admitting: Internal Medicine

## 2022-04-17 ENCOUNTER — Other Ambulatory Visit: Payer: Self-pay | Admitting: Endocrinology

## 2022-04-23 ENCOUNTER — Telehealth: Payer: Self-pay | Admitting: Internal Medicine

## 2022-04-23 ENCOUNTER — Encounter: Payer: Self-pay | Admitting: Internal Medicine

## 2022-04-23 ENCOUNTER — Ambulatory Visit (INDEPENDENT_AMBULATORY_CARE_PROVIDER_SITE_OTHER): Payer: PPO | Admitting: Internal Medicine

## 2022-04-23 ENCOUNTER — Other Ambulatory Visit
Admission: RE | Admit: 2022-04-23 | Discharge: 2022-04-23 | Disposition: A | Discharge status: Home / Self Care | Payer: PPO | Attending: Internal Medicine | Admitting: Internal Medicine

## 2022-04-23 VITALS — BP 110/50 | HR 70 | Ht 67.0 in | Wt 139.0 lb

## 2022-04-23 DIAGNOSIS — I4819 Other persistent atrial fibrillation: Secondary | ICD-10-CM | POA: Diagnosis not present

## 2022-04-23 DIAGNOSIS — I255 Ischemic cardiomyopathy: Secondary | ICD-10-CM | POA: Diagnosis not present

## 2022-04-23 DIAGNOSIS — Z79899 Other long term (current) drug therapy: Secondary | ICD-10-CM | POA: Diagnosis not present

## 2022-04-23 DIAGNOSIS — I493 Ventricular premature depolarization: Secondary | ICD-10-CM | POA: Diagnosis not present

## 2022-04-23 DIAGNOSIS — I5022 Chronic systolic (congestive) heart failure: Secondary | ICD-10-CM | POA: Diagnosis not present

## 2022-04-23 DIAGNOSIS — Z95810 Presence of automatic (implantable) cardiac defibrillator: Secondary | ICD-10-CM

## 2022-04-23 LAB — PACEMAKER DEVICE OBSERVATION

## 2022-04-23 LAB — MAGNESIUM: Magnesium: 2 mg/dL (ref 1.7–2.4)

## 2022-04-23 NOTE — Progress Notes (Signed)
Patient Care Team: Plotnikov, Georgina Quint, MD as PCP - General (Internal Medicine) Wendall Stade, MD as PCP - Cardiology (Cardiology) Duke Salvia, MD as PCP - Electrophysiology (Cardiology) Lindell Noe Lehman Prom., MD (Urology) [name], MD as Consulting Physician (General Surgery) Pyrtle, Carie Caddy, MD as Consulting Physician (Gastroenterology) [name], MD as Referring Physician (Ophthalmology) Michaela Corner, RN as Case Manager Nyoka Cowden, MD as Consulting Physician (Pulmonary Disease) Romero Belling, MD (Inactive) as Consulting Physician (Endocrinology)   HPI  [name] Christensen is a 88 y.o. male   seen in follow-up for ischemic cardiomyopathy with progressive left ventricular dysfunction in the context of frequent PVCs and chronic congestive heart failure.  He has persistent atrial fibrillation for which he was started on dofetilide 8/20  ( as well as PVCs) and anticoagulation with Apixaban    Have been considering AFib, Pacing and PVCs as cause of his symptoms and LV dysfunction>> reprogramming of his device eliminated pacing ( RBBB), dofetilide >>sinus, so the PVCs remain unaddressed with failed Anti-arrhythmic drugs suppression.  Most recently tried to increase rate for overdrive suppression, with some concern about encroaching on AV conduction and triggering RV apical pacing   Seen recently by Dr. Theadora Rama because of cough change to [facility]; is unclear whether the patient is taking both drugs now or not. Cough is better  he patient denies chest pain, shortness of breath, nocturnal dyspnea, orthopnea or peripheral edema.  There have been no palpitations, lightheadedness or syncope.  Complains of fatigue which is really been the limiting state.  Ongoing lassitude.  Wife thinks is related to retirement and he thinks related to residual COVID.   No bleeding on Apixaban      DATE TEST PVC  1/15 Holter 13%  1/18 Holter 31%  2/20 ZIO 7.2% (  all one morphology)   9/20 Pacer 12%  11/20 Pacer 11%  2/21 Pacer 9.4%  9/21  13%  5/23 Pacer 15% (est from AV interval plot)      DATE TEST EF    12/13 Cath   Patent Grafts  2/15 Echo   45-50 %    12/17 Echo   50-55 %    4/19 Echo 20-25%    10/19 Echo  25-30%    1/20 Echo  20-25%    6/20 LHC 20-25% LIMA-LADatretic SVG-OM,SVG-D with filling of LAD,SVG-OM patent  11/20 Echo  25-30%   4/22 Echo  40-45%   9/22 Echo   40-45%     Date Cr K Mg Hgb  6/20 1.14 4.9 2.0(3/20) 12.5   8/20 1.03 4.2 1.8 12.8  2/21 1.06 4.0 2.1 12.7  7/21 1.19 4.8    9/21 1.14 5.0 2.1 11.3  4/22 0.97 4.0  12.8  9/22 0.97 4.3  10.8  1/23     11.7  5/23 1.2 4.1     Antiarrhythmics Date Reason stopped  amio 7/19    dofetilide          Records and Results Reviewed *  Past Medical History:  Diagnosis Date   Actinic keratosis 06/09/2019   L sup forehead at hairline - bx proven   AICD (automatic cardioverter/defibrillator) present 04/07/2017   Allergy    Anxiety    BPH (benign prostatic hypertrophy)    elevated PSA Dr. Lindell Noe Bx 2010   Cardiac arrest [facility])    a. out-of-hospital arrest 11/16/2012 - EF 40-45%, patent grafts on cath, received [employer] AICD.  Carotid disease, bilateral (HCC)    a. 0-39% by doppers.   Cataract    bil cataracts removed   CHF (congestive heart failure) (HCC)    Coronary artery disease    a. s/p MI/CABG 2005. b. s/p cath at time of VF arrest 10/2013 - grafts patent.   Cough    Diverticulosis    Elevated LFTs    a. 10/2012 felt due to cardiac arrest - Hepatitis C Ab reactive from 10/29/2012>>Hep C RNA PCR negative 11/22/2012.    GERD (gastroesophageal reflux disease)    Hyperlipidemia    Hypertension    Internal hemorrhoids    Myocardial infarction (HCC)    2005 - CABG x 5 2005   RBBB    Tubular adenoma of colon    Ventricular bigeminy    a. Event monitor 01/2013: NSR with PVCs and occ bigeminy.    Past Surgical History:  Procedure Laterality Date    APPENDECTOMY     CARDIAC CATHETERIZATION  2007   with patent graft anatomy atretic left internal mammary  artery to the LAD which is nonobstructive. Will restart study June 08, 2007   CARDIOVERSION N/A 03/27/2018   Procedure: CARDIOVERSION;  Surgeon: Iran Ouch, MD;  Location: [facility] ORS;  Service: Cardiovascular;  Laterality: N/A;   CARDIOVERSION N/A 01/04/2019   Procedure: CARDIOVERSION (CATH LAB);  Surgeon: Iran Ouch, MD;  Location: [facility] ORS;  Service: Cardiovascular;  Laterality: N/A;   CARDIOVERSION N/A 07/07/2019   Procedure: CARDIOVERSION;  Surgeon: Wendall Stade, MD;  Location: [facility] ENDOSCOPY;  Service: Cardiovascular;  Laterality: N/A;   CATARACT EXTRACTION W/PHACO Right 04/23/2016   Procedure: CATARACT EXTRACTION PHACO AND INTRAOCULAR LENS PLACEMENT (IOC);  Surgeon: [name], MD;  Location: [facility] ORS;  Service: Ophthalmology;  Laterality: Right;  [place] 1.09 AP% 19.3 CDE 13.38 Fluid pack lot # [id number]   CHOLECYSTECTOMY N/A 06/05/2017   Procedure: LAPAROSCOPIC CHOLECYSTECTOMY WITH INTRAOPERATIVE CHOLANGIOGRAM ERAS PATHWAY POSSIBLE NEEDLE CORE BIOPSY OF LIVER;  Surgeon: [name], MD;  Location: [facility] OR;  Service: General;  Laterality: N/A;  ERAS PATHWAY   COLONOSCOPY     CORONARY ARTERY BYPASS GRAFT  2005   EYE SURGERY     ICD GENERATOR CHANGEOUT N/A 04/07/2017   Procedure: ICD Generator Changeout;  Surgeon: Duke Salvia, MD;  Location: [facility] INVASIVE CV LAB;  Service: Cardiovascular;  Laterality: N/A;   IMPLANTABLE CARDIOVERTER DEFIBRILLATOR IMPLANT N/A 10/30/2012   STJ single chamber ICD implanted by Dr Graciela Husbands for cardiac arrest    INGUINAL HERNIA REPAIR Bilateral 01/17/2015   Procedure: BILATERAL LAPAROSCOPIC INGUINAL HERNIA REPAIR WITH LEFT FEMORAL HERNIA REPAIR;  Surgeon: [name], MD;  Location: [facility] OR;  Service: General;  Laterality: Bilateral;   INSERTION OF MESH Bilateral 01/17/2015   Procedure: INSERTION OF MESH;  Surgeon: [name], MD;  Location[facility] OR;   Service: General;  Laterality: Bilateral;   LAPAROSCOPIC CHOLECYSTECTOMY  06/05/2017   LEAD INSERTION N/A 04/07/2017   Procedure: RA Lead Insertion;  Surgeon: Duke Salvia, MD;  Location: [facility] INVASIVE CV LAB;  Service: Cardiovascular;  Laterality: N/A;   LEAD REVISION/REPAIR N/A 04/08/2017   Procedure: Atrial Lead Revision/Repair;  Surgeon: Duke Salvia, MD;  Location: [facility] INVASIVE CV LAB;  Service: Cardiovascular;  Laterality: N/A;   LEFT HEART CATH AND CORONARY ANGIOGRAPHY Left 05/14/2019   Procedure: LEFT HEART CATH AND CORONARY ANGIOGRAPHY;  Surgeon: Iran Ouch, MD;  Location: [facility] INVASIVE CV LAB;  Service: Cardiovascular;  Laterality: Left;   LEFT HEART CATHETERIZATION  WITH CORONARY/GRAFT ANGIOGRAM N/A 10/28/2012   Procedure: LEFT HEART CATHETERIZATION WITH Isabel Caprice;  Surgeon: [name], MD;  Location: [facility] CATH LAB;  Service: Cardiovascular;  Laterality: N/A;   LIVER BIOPSY N/A 06/05/2017   Procedure: SINGLE SITE LIVER BIOPSY ERAS PATHWAY;  Surgeon: [name], MD;  Location: [facility] OR;  Service: General;  Laterality: N/A;  ERAS PATHWAY   LUMBAR FUSION  09/2007    Current Meds  Medication Sig   acetaminophen (TYLENOL) 500 MG tablet Take 500 mg by mouth at bedtime.   alfuzosin (UROXATRAL) 10 MG 24 hr tablet Take 10 mg by mouth every evening.    atorvastatin (LIPITOR) 40 MG tablet TAKE ONE-HALF TABLET BY MOUTH AT BEDTIME FOR HIGH CHOLESTEROL   betamethasone dipropionate 0.05 % cream Apply 1 application. topically 2 (two) times daily as needed.   cinacalcet (SENSIPAR) 30 MG tablet Take 1 tablet (30 mg total) by mouth 3 (three) times a week.   Dextromethorphan-guaiFENesin (MUCINEX DM PO) Take 30 mg by mouth in the morning and at bedtime.   dofetilide (TIKOSYN) 250 MCG capsule TAKE ONE CAPSULE BY MOUTH TWICE A DAY   dutasteride (AVODART) 0.5 MG capsule Take 0.5 mg by mouth every evening.    ELIQUIS 5 MG TABS tablet TAKE 1 TABLET BY MOUTH TWICE (2) DAILY    escitalopram (LEXAPRO) 10 MG tablet TAKE 1 TABLET BY MOUTH ONCE DAILY   famotidine (PEPCID) 20 MG tablet Take 20 mg by mouth daily.   HYDROcodone bit-homatropine (HYCODAN) 5-1.5 MG/5ML syrup TAKE 5 MLS BY MOUTH EVERY 6 HOURS AS NEEDED FOR COUGH   metoprolol succinate (TOPROL-XL) 50 MG 24 hr tablet TAKE 1 TABLET BY MOUTH TWICE A DAY. TAKEWITH OR IMMEDIATELY FOLLOWING A MEAL   Multiple Vitamin (MULTIVITAMIN WITH MINERALS) TABS tablet Take 1 tablet by mouth every other day.   nitroGLYCERIN (NITROSTAT) 0.4 MG SL tablet DISSOLVE 1 TABLET UNDER TONGUE AS NEEDEDFOR CHEST PAIN. MAY REPEAT 5 MINUTES APART 3 TIMES IF NEEDED   omeprazole (PRILOSEC) 20 MG capsule Take 20 mg by mouth daily.   Plecanatide 3 MG TABS TAKE ONE TABLET BY MOUTH ONCE EVERY DAY FOR CONSTIPATION *REPLACES LINACLOTIDE (LINZESS)*   Polyethyl Glycol-Propyl Glycol (SYSTANE OP) Place 1 drop into both eyes daily as needed (burning eyes).    sacubitril-valsartan (ENTRESTO) 49-51 MG TAKE ONE-HALF TABLET BY MOUTH EVERY 12 HOURS *NOTE [facility] REQUIRES TABLET SPLITTING   triamcinolone cream (KENALOG) 0.1 % APPLY 1 APPLICATION TOPICALLY TWO TIMES DAILY AS NEEDED AVOIDING FACE, GROIN ANDARMPITS   valsartan (DIOVAN) 80 MG tablet One twice daily   vitamin B-12 (CYANOCOBALAMIN) 1000 MCG tablet Take 1,000 mcg by mouth daily.   Current Outpatient Medications on File Prior to Visit  Medication Sig Dispense Refill   acetaminophen (TYLENOL) 500 MG tablet Take 500 mg by mouth at bedtime.     alfuzosin (UROXATRAL) 10 MG 24 hr tablet Take 10 mg by mouth every evening.      atorvastatin (LIPITOR) 40 MG tablet TAKE ONE-HALF TABLET BY MOUTH AT BEDTIME FOR HIGH CHOLESTEROL     betamethasone dipropionate 0.05 % cream Apply 1 application. topically 2 (two) times daily as needed.     cinacalcet (SENSIPAR) 30 MG tablet Take 1 tablet (30 mg total) by mouth 3 (three) times a week. 40 tablet 3   Dextromethorphan-guaiFENesin (MUCINEX DM PO) Take 30 mg by mouth  in the morning and at bedtime.     dofetilide (TIKOSYN) 250 MCG capsule TAKE ONE CAPSULE BY MOUTH TWICE A  DAY 180 capsule 2   dutasteride (AVODART) 0.5 MG capsule Take 0.5 mg by mouth every evening.      ELIQUIS 5 MG TABS tablet TAKE 1 TABLET BY MOUTH TWICE (2) DAILY 180 tablet 3   escitalopram (LEXAPRO) 10 MG tablet TAKE 1 TABLET BY MOUTH ONCE DAILY 30 tablet 5   famotidine (PEPCID) 20 MG tablet Take 20 mg by mouth daily.     HYDROcodone bit-homatropine (HYCODAN) 5-1.5 MG/5ML syrup TAKE 5 MLS BY MOUTH EVERY 6 HOURS AS NEEDED FOR COUGH 240 mL 0   metoprolol succinate (TOPROL-XL) 50 MG 24 hr tablet TAKE 1 TABLET BY MOUTH TWICE A DAY. TAKEWITH OR IMMEDIATELY FOLLOWING A MEAL 180 tablet 3   Multiple Vitamin (MULTIVITAMIN WITH MINERALS) TABS tablet Take 1 tablet by mouth every other day.     nitroGLYCERIN (NITROSTAT) 0.4 MG SL tablet DISSOLVE 1 TABLET UNDER TONGUE AS NEEDEDFOR CHEST PAIN. MAY REPEAT 5 MINUTES APART 3 TIMES IF NEEDED 25 tablet 3   omeprazole (PRILOSEC) 20 MG capsule Take 20 mg by mouth daily.     Plecanatide 3 MG TABS TAKE ONE TABLET BY MOUTH ONCE EVERY DAY FOR CONSTIPATION *REPLACES LINACLOTIDE (LINZESS)*     Polyethyl Glycol-Propyl Glycol (SYSTANE OP) Place 1 drop into both eyes daily as needed (burning eyes).      sacubitril-valsartan (ENTRESTO) 49-51 MG TAKE ONE-HALF TABLET BY MOUTH EVERY 12 HOURS *NOTE [facility] PRES[facility] REQUIRES TABLET SPLITTING     triamcinolone cream (KENALOG) 0.1 % APPLY 1 APPLICATION TOPICALLY TWO TIMES DAILY AS NEEDED AVOIDING FACE, GROIN ANDARMPITS 80 g 0   valsartan (DIOVAN) 80 MG tablet One twice daily 60 tablet 11   vitamin B-12 (CYANOCOBALAMIN) 1000 MCG tablet Take 1,000 mcg by mouth daily.     benzonatate (TESSALON) 100 MG capsule benzonatate 100 mg capsule  TAKE 1 CAPSULE (100 MG TOTAL) BY MOUTH EVERY 8 (EIGHT) HOURS (Patient not taking: Reported on 04/23/2022)     cefdinir (OMNICEF) 300 MG capsule cefdinir 300 mg capsule (Patient not taking: Reported on  04/23/2022)     No current facility-administered medications on file prior to visit.     Allergies  Allergen Reactions   Ezetimibe Other (See Comments)    Pt was in coma for 6 days, numbness   Penicillins Other (See Comments)    BLISTERS BETWEEN FINGERS Did it involve swelling of the face/tongue/throat, SOB, or low BP? No Did it involve sudden or severe rash/hives, skin peeling, or any reaction on the inside of your mouth or nose? Yes Did you need to seek medical attention at a hospital or doctor's office? No When did it last happen?      60 years If all above answers are "NO", may proceed with cephalosporin use.    Pravastatin Sodium Other (See Comments)    Aches and pains in joints   Rosuvastatin Other (See Comments)    MYALGIAS   Niacin Anxiety and Other (See Comments)    "Makes me feel nervous, jittery"      Review of Systems negative except from HPI and PMH  Physical Exam BP (!) 110/50 (BP Location: Left Arm, Patient Position: Sitting, Cuff Size: Normal)   Pulse 70   Ht 5\' 7"  (1.702 m)   Wt 139 lb (63 kg)   SpO2 95%   BMI 21.77 kg/m  Well developed and well nourished in no acute distress HENT normal Neck supple with JVP-flat Clear Device pocket well healed; without hematoma or erythema.  There is no tethering  Regular rate and rhythm, no  gallop No / murmur Abd-soft with active BS No Clubbing cyanosis  edema Skin-warm and dry A & Oriented  Grossly normal sensory and motor function  ECG atrial pacing at 70 Intervals 34/17/46 Right bundle branch block Occasional PVC     ASSESSMENT & PLAN:    Atrial fibrillation/flutter-persistent   PVCs-right bundle branch superior axis --identified by his device by ventricular rates slower than paced rate of 70 or AV interval shorter than 300 ms   Cardiomyopathy recurrent   History of aborted sudden cardiac death     Implantable defibrillator-St. Jude      Ischemic cardiomyopathy with prior CABG     Congestive  Heart Failure chronic systolic  High Risk Medication Surveillance,Dofetilide   Right bundle branch block.  Anemia   Sinus bradycardia/chronotropic incompetence  No angina.  Continue on anticoagulation and metoprolol 50.  Left ventricular function remains mildly impaired and stable.  He is Sherryll Burger had been proscribed to be stopped; it is unclear from the family or the pharmacy as to what he is taking i.e. whether he is taking both Entresto and valsartan.  They will look at their medicines at home and call us.  In the event that he is not taking Entresto, we will decrease his metoprolol from 50 twice daily--50 daily in the hopes that it may attenuate some of his fatigue.  We will recheck his hemoglobin.  Discussed  John Piper's "do not waste your life "  No interval atrial fibrillation.  We will continue the dofetilide, needs magnesium surveillance.  Continue Eliquis although he is weight is closing in on down titration, will hold for right now at 5 mg twice daily                .  Sherryl Manges, MD  04/23/2022 10:26 AM     [facility] [address] [place] [address] [phone] (office) [phone] (fax) yes.

## 2022-04-23 NOTE — Telephone Encounter (Signed)
Pt c/o medication issue:  1. Name of Medication:   metoprolol succinate (TOPROL-XL) 50 MG 24 hr tablet    sacubitril-valsartan (ENTRESTO) 49-51 MG    2. How are you currently taking this medication (dosage and times per day)?   3. Are you having a reaction (difficulty breathing--STAT)? No  4. What is your medication issue? Pt states that he was to call back with medications. Pt states that [facility]. Pt would like for nurse to callback. Please advise

## 2022-04-23 NOTE — Patient Instructions (Signed)
Medication Instructions:  - No medication changes made today:  Please call the office when you get home/ send a MyChart message to confirm if you are taking both Valsartan & Entresto   *If you need a refill on your cardiac medications before your next appointment, please call your pharmacy*   Lab Work: - Your physician recommends that you return for lab work at your convenience:  Magnesium level  Nature conservation officer at [facility] 1st desk on the right to check in (REGISTRATION)  Lab hours: Monday- Friday (7:30 am- 5:30 pm)   If you have labs (blood work) drawn today and your tests are completely normal, you will receive your results only by: MyChart Message (if you have MyChart) OR A paper copy in the mail If you have any lab test that is abnormal or we need to change your treatment, we will call you to review the results.   Testing/Procedures: - none ordered   Follow-Up: At [facility], you and your health needs are our priority.  As part of our continuing mission to provide you with exceptional heart care, we have created designated Provider Care Teams.  These Care Teams include your primary Cardiologist (physician) and Advanced Practice Providers (APPs -  Physician Assistants and Nurse Practitioners) who all work together to provide you with the care you need, when you need it.  We recommend signing up for the patient portal called "MyChart".  Sign up information is provided on this After Visit Summary.  MyChart is used to connect with patients for Virtual Visits (Telemedicine).  Patients are able to view lab/test results, encounter notes, upcoming appointments, etc.  Non-urgent messages can be sent to your provider as well.   To learn more about what you can do with MyChart, go to [email or web address].    Your next appointment:   6 month(s)  The format for your next appointment:   In Person  Provider:   Virl Axe, MD    Other Instructions N/a  Important  Information About Sugar

## 2022-04-23 NOTE — Telephone Encounter (Signed)
I called and spoke with the patient. He clarified that he is taking:  1) Metoprolol succinate 100 mg:  - 0.5 tablet (50 mg) by mouth TWICE daily   2) Sacubitril 49 mg-Valsartan 51 mg - 0.5 tablet by mouth TWICE daily (This was sent from the [place] and I verified with him several times this was a label on the bottle he is currently taking).  3) He ran out of Valsartan 80 mg tablets ~ 10 days ago and then started the [place] - He states Dr. Alain Marion had him take valsartan 80 mg BID for a few days because his BP was high, but then he decreased this dose to 80 mg once daily on his own.  BP's while on the Valsartan 80 mg once daily dose was: 102/60 (70), 129/69 (64), 136/67, & then SBP readings of 109, 125, 145.  I advised the patient if he is taking sacubitril-valsartan, then that in fact is Entresto. He is aware I will review with Dr. [name] and call him back.   The patient voices understanding and is agreeable.

## 2022-04-24 MED ORDER — VALSARTAN 80 MG PO TABS: ORAL_TABLET | ORAL | 2 refills | Status: DC

## 2022-04-24 NOTE — Telephone Encounter (Signed)
I reviewed the message below with Dr. [name] by phone.   Per Dr. [name]: 1) STOP sacubitril '49mg'$ -valsartan 51 mg  2) START valsartan 80 mg- take 1 tablet by mouth ONCE daily  Attempted to call the patient this morning to discuss. His wife answered the phone and advised he was currently out, but wanted her to take the message.   I advised Mrs. Rothman of the above MD recommendations. She voices understanding and is aware I will send an updated RX to [facility]. She is also aware I will send a MyChart message to the patient with the above instructions as well.  She was appreciative of this and agreeable.

## 2022-04-30 ENCOUNTER — Ambulatory Visit (INDEPENDENT_AMBULATORY_CARE_PROVIDER_SITE_OTHER): Payer: PPO | Admitting: *Deleted

## 2022-04-30 DIAGNOSIS — I10 Essential (primary) hypertension: Secondary | ICD-10-CM

## 2022-04-30 DIAGNOSIS — I5032 Chronic diastolic (congestive) heart failure: Secondary | ICD-10-CM

## 2022-04-30 NOTE — Chronic Care Management (AMB) (Cosign Needed Addendum)
Chronic Care Management   CCM RN Visit Note  04/30/2022 Name: [name] Christensen MRN: [id number] DOB: 1931/12/09  Subjective: [name] Christensen is a 88 y.o. year old male who is a primary care patient of Plotnikov, Georgina Quint, MD. The care management team was consulted for assistance with disease management and care coordination needs.    Engaged with patient by telephone for  acute/ unscheduled telephone outreach  in response to provider referral for case management and/or care coordination services.   Consent to Services:  The patient was given information about Chronic Care Management services, agreed to services, and gave verbal consent prior to initiation of services.  Please see initial visit note for detailed documentation.  Patient agreed to services and verbal consent obtained.   Assessment: Review of patient past medical history, allergies, medications, health status, including review of consultants reports, laboratory and other test data, was performed as part of comprehensive evaluation and provision of chronic care management services.  CCM Care Plan  Allergies  Allergen Reactions   Ezetimibe Other (See Comments)    Pt was in coma for 6 days, numbness   Penicillins Other (See Comments)    BLISTERS BETWEEN FINGERS Did it involve swelling of the face/tongue/throat, SOB, or low BP? No Did it involve sudden or severe rash/hives, skin peeling, or any reaction on the inside of your mouth or nose? Yes Did you need to seek medical attention at a hospital or doctor's office? No When did it last happen?      60 years If all above answers are "NO", may proceed with cephalosporin use.    Pravastatin Sodium Other (See Comments)    Aches and pains in joints   Rosuvastatin Other (See Comments)    MYALGIAS   Niacin Anxiety and Other (See Comments)    "Makes me feel nervous, jittery"   Outpatient Encounter Medications as of 04/30/2022  Medication Sig   acetaminophen (TYLENOL) 500 MG tablet  Take 500 mg by mouth at bedtime.   alfuzosin (UROXATRAL) 10 MG 24 hr tablet Take 10 mg by mouth every evening.    atorvastatin (LIPITOR) 40 MG tablet TAKE ONE-HALF TABLET BY MOUTH AT BEDTIME FOR HIGH CHOLESTEROL   benzonatate (TESSALON) 100 MG capsule benzonatate 100 mg capsule  TAKE 1 CAPSULE (100 MG TOTAL) BY MOUTH EVERY 8 (EIGHT) HOURS (Patient not taking: Reported on 04/23/2022)   betamethasone dipropionate 0.05 % cream Apply 1 application. topically 2 (two) times daily as needed.   cefdinir (OMNICEF) 300 MG capsule cefdinir 300 mg capsule (Patient not taking: Reported on 04/23/2022)   cinacalcet (SENSIPAR) 30 MG tablet Take 1 tablet (30 mg total) by mouth 3 (three) times a week.   Dextromethorphan-guaiFENesin (MUCINEX DM PO) Take 30 mg by mouth in the morning and at bedtime.   dofetilide (TIKOSYN) 250 MCG capsule TAKE ONE CAPSULE BY MOUTH TWICE A DAY   dutasteride (AVODART) 0.5 MG capsule Take 0.5 mg by mouth every evening.    ELIQUIS 5 MG TABS tablet TAKE 1 TABLET BY MOUTH TWICE (2) DAILY   escitalopram (LEXAPRO) 10 MG tablet TAKE 1 TABLET BY MOUTH ONCE DAILY   famotidine (PEPCID) 20 MG tablet Take 20 mg by mouth daily.   HYDROcodone bit-homatropine (HYCODAN) 5-1.5 MG/5ML syrup TAKE 5 MLS BY MOUTH EVERY 6 HOURS AS NEEDED FOR COUGH   metoprolol succinate (TOPROL-XL) 50 MG 24 hr tablet TAKE 1 TABLET BY MOUTH TWICE A DAY. TAKEWITH OR IMMEDIATELY FOLLOWING A MEAL   Multiple Vitamin (MULTIVITAMIN WITH  MINERALS) TABS tablet Take 1 tablet by mouth every other day.   nitroGLYCERIN (NITROSTAT) 0.4 MG SL tablet DISSOLVE 1 TABLET UNDER TONGUE AS NEEDEDFOR CHEST PAIN. MAY REPEAT 5 MINUTES APART 3 TIMES IF NEEDED   omeprazole (PRILOSEC) 20 MG capsule Take 20 mg by mouth daily.   Plecanatide 3 MG TABS TAKE ONE TABLET BY MOUTH ONCE EVERY DAY FOR CONSTIPATION *REPLACES LINACLOTIDE (LINZESS)*   Polyethyl Glycol-Propyl Glycol (SYSTANE OP) Place 1 drop into both eyes daily as needed (burning eyes).     triamcinolone cream (KENALOG) 0.1 % APPLY 1 APPLICATION TOPICALLY TWO TIMES DAILY AS NEEDED AVOIDING FACE, GROIN ANDARMPITS   valsartan (DIOVAN) 80 MG tablet Take 1 tablet (80 mg) by mouth once daily   vitamin B-12 (CYANOCOBALAMIN) 1000 MCG tablet Take 1,000 mcg by mouth daily.   No facility-administered encounter medications on file as of 04/30/2022.   Patient Active Problem List   Diagnosis Date Noted   Cardiac pacemaker in situ 04/02/2022   Chronic idiopathic constipation 04/02/2022   Heart failure (HCC) 04/02/2022   Problem related to unspecified psychosocial circumstances 04/02/2022   Sensorineural hearing loss, bilateral 04/02/2022   Vitamin B12 deficiency (non anemic) 04/02/2022   Upper airway cough syndrome 03/26/2022   Systolic and diastolic CHF, chronic (HCC) 03/26/2022   Anemia 09/25/2021   Osteoporosis 07/26/2021   Post-COVID chronic fatigue 07/26/2021   Diarrhea 07/03/2021   COVID-19 06/10/2021   Low blood pressure 06/10/2021   Aortic atherosclerosis (HCC) 04/02/2021   Apathy 04/02/2021   Memory problem 03/01/2021   Contusion of left shoulder 01/07/2021   Chronic renal insufficiency, stage 3 (moderate) (HCC) 01/01/2021   Persistent atrial fibrillation (HCC) 07/05/2019   Ischemic cardiomyopathy    Unstable angina (HCC)    Upper respiratory infection 09/23/2018   Hypocalciuric hypercalcemia 09/16/2018   Fall 08/17/2018   Facial pain 08/17/2018   Hematuria 05/20/2018   Paroxysmal atrial fibrillation (HCC)    Bronchiectasis (HCC) assoc with possible MAI 03/11/2018   Dyspnea on exertion 03/10/2018   Chronic diastolic CHF (congestive heart failure) (HCC) 03/09/2018   Atrial flutter (HCC) 03/09/2018   PVC's (premature ventricular contractions) 03/09/2018   Arthralgia 01/29/2018   Dry skin 12/22/2017   Carotid stenosis, asymptomatic, bilateral 12/22/2017   Chills with fever 11/24/2017   Dysuria 11/24/2017   Anxiety disorder 11/24/2017   Hepatic cirrhosis (HCC)  06/05/2017   Chronic calculous cholecystitis 06/05/2017   Weight loss 04/24/2017   Sinus node dysfunction (HCC) 04/07/2017   Chronic cholecystitis s/p lap cholecystectomy 06/05/2017 02/24/2017   Pleural effusion 02/24/2017   Dyslipidemia 08/05/2016   Fatigue 04/09/2016   Constipation 08/09/2015   Cough in adult 11/30/2014   LBP (low back pain) 08/30/2013   Chest pain, atypical 07/21/2013   Lung infiltrate 07/19/2013   Ventricular vs Supraventricular bigeminy 04/03/2013   Implantable cardioverter-defibrillator (ICD) in situ 11/24/2012   CAP (community acquired pneumonia) 11/22/2012   Sudden cardiac death (HCC) November 02, 2012   CORONARY ARTERY BYPASS GRAFT, HX OF 10/11/2008   Insomnia 12/07/2007   Essential hypertension 09/01/2007   CAD (coronary artery disease) 09/01/2007   GERD 09/01/2007   BPH (benign prostatic hyperplasia) 09/01/2007   Conditions to be addressed/monitored:  Atrial Fibrillation, CHF, and HTN  Care Plan : RN Care Manager Plan of Care  Updates made by Michaela Corner, RN since 04/30/2022 12:00 AM     Problem: Chronic Disease Management Needs   Priority: Medium     Long-Range Goal: Ongoing adherence to established plan of care for long  term chronic disease management   Start Date: 09/06/2021  Expected End Date: 09/06/2022  Priority: Medium  Note:   Current Barriers:  Chronic Disease Management support and education needs related to CHF, HTN, and post-Covid syndrome 12/04/21: Spouse had recent head injury requiring surgery (November 2022)- patient has been under stress related to wife's injury: reports getting slowly better as wife recuperates; reports recuperating "well" and getting better "every day"-- follow up 01/02/22: reports wife "much better;" states wife is easily tired, but has resumed her normal ADL/iADL's  RNCM Clinical Goal(s):  Patient will demonstrate ongoing health management independence HTN; CHF; post-Covid syndrome  through collaboration with RN  Care manager, provider, and care team.   Interventions: 1:1 collaboration with primary care provider regarding development and update of comprehensive plan of care as evidenced by provider attestation and co-signature Inter-disciplinary care team collaboration (see longitudinal plan of care) Evaluation of current treatment plan related to  self management and patient's adherence to plan as established by provider  04/30/22: Acute/ unscheduled outreach: patient contacted me on 04/29/22 and left voice message requesting call-back; in message, he reported that he has concerns with his blood pressures and heart rates at home have been elevated on several occasions and he is concerned; attempted outreach to patient on 04/29/22 but he did not answer; left voice mail, asking him to contact me Successfully outreached patient 04/30/22: he reports "about 3 or 4 recent" episodes in which his blood pressures have been elevated "130/95 or higher," which is also associated with increased heart rate- states this has occurred since medication changes were made at time of last office visit with Dr. Graciela Husbands on 04/23/22 (Valsartan) States during these episodes, he has had non-specific symptoms: "mainly, I just feel really tired;" states his symptoms have spontaneously subsided after lying down, but he is concerned due to it happening several times since he had office visit with Dr. Graciela Husbands Reviewed office visit with Dr. Graciela Husbands 04/23/22- he verbalizes a good understanding of post- visit instructions, was able to verbalize accurate understanding of medication changes and confirmed he did make changes as instructed (stop Entresto/ start valsartan) Reports "feel better" today; states his blood pressures and heart rates this morning "were okay" Encouraged patient to quantify these episodes on his calendar and encouraged him to consider scheduling another appointment in follow up with Dr. Graciela Husbands, given his concern and the ongoing but  intermittent nature of these episodes-- he will do I will make Dr. Graciela Husbands aware of patient's report today as FYI  From Acute outreach 01/14/22:  Confirmed patient "feeling better;" after recent diagnosis of pneumonia: reports he took antibiotics/ prednisone as prescribed; states cough medicine "really helping, still taking;" would like a refill for the cough medication PCP prescribed 11/29/21-- I will make this request on patient's behalf 01/14/22 Care coordination outreach: patient left off-hours voice message stating that he has not yet heard from outpatient pharmacy about the refill of his cough medication  which he requested during our last outreach on 01/02/22; he is requesting follow up on refill status Contacted patient and left voice message confirming that I had placed his request with PCP on 01/02/22; unable to verify any return response from PCP Care Coordination outreach on patient's behalf to his outpatient pharmacy-- requested that outpatient  pharmacy send refill request to PCP, per standard refill process; spoke with Morrie Sheldon- she will send re-fill request for Hycodan cough syrup to PCP; I will also re-message PCP and again make him aware of patient's request Re-contacted patient  and provided update via voice messaging that I had made this request once again with both PCP and with outpatient pharmacy Patient contacted me in follow up, left voice message requesting callback; returned patient's call and updated him on what I have done today to address his re-fill request; patient appreciative of follow up Reviewed follow up PCP office visit 12/18/21- patient denies questions, concerns Confirmed no medication changes, patient continues independently self-managing  Pain assessment updated: recently "threw my shoulder out" while washing a hot pot in the kitchen: slowly improving; pain tolerable with conservative interventions (rest, "muscle cream")  From 01/02/22 outreach:  Post-COVID syndrome:   (Status: 01/02/22- Goal on Track (progressing): YES.)  Long-Term goal  Confirmed patient continues recuperating from recent long-COVID/ new pneumonia- starting to feel better; starting to gain weight back-- reports current weight maintained between "130-134 lbs" (no change from last outreach); reports weight today as "130.6 lbs" Patient reports continues to tolerate activity "okay;" states he has resumed going to gym "3 or 4 times a week;" he is walking 1 mile with each session, and using machines; avoiding upper extremity exercises due to recent shoulder soreness; tolerating activity/ workouts "good" Encouraged ongoing activity, and reminded patient to pace self and not over-do Reviewed with patient importance of daily weights in setting of CHF; he verbalizes a good understanding of rationale and confirms he continues monitoring daily Confirmed no clinical concerns around shortness of breath outside of baseline, swelling- denies Reviewed established action plan for weight gain Confirmed patient plans to schedule cardiology office visit "soon"  Hypertension: (Status: 01/02/22- Goal on Track (progressing): YES.) Long-Term goal Last practice recorded BP readings:  BP Readings from Last 3 Encounters:  11/29/21 138/78  09/25/21 140/70  08/28/21 110/60  Most recent eGFR/CrCl: No results found for: EGFR  No components found for: CRCL  Evaluation of current treatment plan related to hypertension self management and patient's adherence to plan as established by provider;   Discussed plans with patient for ongoing care management follow up and provided patient with direct contact information for care management team; Confirmed patient occasionally monitors blood pressures at home, reports "all have been okay;" does not have specific values for our review today; encouraged ongoing home monitoring Confirms is still not taking blood pressure medications; denies medication concerns today Reinforced previously  provided verbal education re: signs/ symptoms yellow CHF zone along with corresponding action plan; today, he denies clinical concerns around breathing status, lower extremity/ abdominal swelling and sounds to be in no distress throughout phone call today Reviewed upcoming provider appointments with patient: 01/30/22- PCP; confirmed patient aware, has plans at attend as scheduled  Patient Goals/Self-Care Activities: As evidenced by review of EHR, collaboration with care team, and patient reporting during CCM RN CM outreach,  Patient [place] will: Take medications as prescribed   Attend all scheduled provider appointments  Call pharmacy for medication refills  Call provider office for new concerns or questions  Continue to follow heart healthy, low salt, carbohydrate modified, low sugar diet Continue to monitor and record blood pressures at home weekly, or more often if you are feeling bad or more tired than usual If you have more episodes where your blood pressures and heart rates are elevated, please write these down on paper so you know how often this is happening Please consider calling Dr. Graciela Husbands to make an appointment if these episodes continue Continue to monitor and record weights at home weekly Continue to stay active on a regular basis and will continue to  pace activity and not over-do  Continue to take precautions to prevent falls     Plan: Telephone follow up appointment with care management team member scheduled for:  Wednesday, May 22, 2022 at 9:00 am The patient has been provided with contact information for the care management team and has been advised to call with any health related questions or concerns  Caryl Pina, RN, BSN, CCRN Alumnus [facility] RN Care Coordination- [facility] [place] [phone]: direct office   Medical screening examination/treatment/procedure(s) were performed by non-physician practitioner and as supervising physician I was immediately  available for consultation/collaboration.  I agree with above. Jacinta Shoe, MD

## 2022-04-30 NOTE — Patient Instructions (Signed)
Visit [name], thank you for taking time to talk with me today. Please don't hesitate to contact me if I can be of assistance to you before our next scheduled telephone appointment  Below are the goals we discussed today:  Patient Self-Care Activities: Patient [name] Christensen will: Take medications as prescribed   Attend all scheduled provider appointments  Call pharmacy for medication refills  Call provider office for new concerns or questions  Continue to follow heart healthy, low salt, carbohydrate modified, low sugar diet Continue to monitor and record blood pressures at home weekly, or more often if you are feeling bad or more tired than usual If you have more episodes where your blood pressures and heart rates are elevated, please write these down on paper so you know how often this is happening Please consider calling Dr. [name] to make an appointment if these episodes continue Continue to monitor and record weights at home weekly Continue to stay active on a regular basis and will continue to pace activity and not over-do  Continue to take precautions to prevent falls  Our next scheduled telephone follow up visit/ appointment is scheduled on: Wednesday, May 22, 2022 at 9:00 am- This is a PHONE [place] appointment  If you need to cancel or re-schedule our visit, please call [phone] and our care guide team will be happy to assist you.   I look forward to hearing about your progress.   [name], RN, BSN, [place] [phone]: direct office  If you are experiencing a Mental Health or [place] or need someone to talk to, please  call the Suicide and Crisis Lifeline: 988 call the [place] National Suicide Prevention Lifeline: 425-570-1616 or TTY: 878 264 1384 TTY 209-024-0707) to talk to a trained counselor call 1-800-273-TALK (toll free, 24 hour hotline) go to [facility]  Urgent Care [address], [place] [phone]) call 911   Patient verbalizes understanding of instructions and care plan provided today and agrees to view in [place]. Active MyChart status and patient understanding of how to access instructions and care plan via MyChart confirmed with patient  Atrial Fibrillation  Atrial fibrillation is a type of irregular or rapid heartbeat (arrhythmia). In atrial fibrillation, the top part of the heart (atria) beats in an irregular pattern. This makes the heart unable to pump blood normally and effectively. The goal of treatment is to prevent blood clots from forming, control your heart rate, or restore your heartbeat to a normal rhythm. If this condition is not treated, it can cause serious problems, such as a weakened heart muscle (cardiomyopathy) or a stroke. What are the causes? This condition is often caused by medical conditions that damage the heart's electrical system. These include: High blood pressure (hypertension). This is the most common cause. Certain heart problems or conditions, such as heart failure, coronary artery disease, heart valve problems, or heart surgery. Diabetes. Overactive thyroid (hyperthyroidism). Obesity. Chronic kidney disease. In some cases, the cause of this condition is not known. What increases the risk? This condition is more likely to develop in: Older people. People who smoke. Athletes who do endurance exercise. People who have a family history of atrial fibrillation. Men. People who use drugs. People who drink a lot of alcohol. People who have lung conditions, such as emphysema, pneumonia, or COPD. People who have obstructive sleep apnea. What are the signs or symptoms? Symptoms of this condition include: A feeling that your heart  is racing or beating irregularly. Discomfort or pain in your chest. Shortness of breath. Sudden light-headedness or weakness. Tiring easily during exercise or  activity. Fatigue. Syncope (fainting). Sweating. In some cases, there are no symptoms. How is this diagnosed? Your health care provider may detect atrial fibrillation when taking your pulse. If detected, this condition may be diagnosed with: An electrocardiogram (ECG) to check electrical signals of the heart. An ambulatory cardiac monitor to record your heart's activity for a few days. A transthoracic echocardiogram (TTE) to create pictures of your heart. A transesophageal echocardiogram (TEE) to create even closer pictures of your heart. A stress test to check your blood supply while you exercise. Imaging tests, such as a CT scan or chest X-ray. Blood tests. How is this treated? Treatment depends on underlying conditions and how you feel when you experience atrial fibrillation. This condition may be treated with: Medicines to prevent blood clots or to treat heart rate or heart rhythm problems. Electrical cardioversion to reset the heart's rhythm. A pacemaker to correct abnormal heart rhythm. Ablation to remove the heart tissue that sends abnormal signals. Left atrial appendage closure to seal the area where blood clots can form. In some cases, underlying conditions will be treated. Follow these instructions at home: Medicines Take over-the counter and prescription medicines only as told by your health care provider. Do not take any new medicines without talking to your health care provider. If you are taking blood thinners: Talk with your health care provider before you take any medicines that contain aspirin or NSAIDs, such as ibuprofen. These medicines increase your risk for dangerous bleeding. Take your medicine exactly as told, at the same time every day. Avoid activities that could cause injury or bruising, and follow instructions about how to prevent falls. Wear a medical alert bracelet or carry a card that lists what medicines you take. Lifestyle     Do not use any products  that contain nicotine or tobacco, such as cigarettes, e-cigarettes, and chewing tobacco. If you need help quitting, ask your health care provider. Eat heart-healthy foods. Talk with a dietitian to make an eating plan that is right for you. Exercise regularly as told by your health care provider. Do not drink alcohol. Lose weight if you are overweight. Do not use drugs, including cannabis. General instructions If you have obstructive sleep apnea, manage your condition as told by your health care provider. Do not use diet pills unless your health care provider approves. Diet pills can make heart problems worse. Keep all follow-up visits as told by your health care provider. This is important. Contact a health care provider if you: Notice a change in the rate, rhythm, or strength of your heartbeat. Are taking a blood thinner and you notice more bruising. Tire more easily when you exercise or do heavy work. Have a sudden change in weight. Get help right away if you have:  Chest pain, abdominal pain, sweating, or weakness. Trouble breathing. Side effects of blood thinners, such as blood in your vomit, stool, or urine, or bleeding that cannot stop. Any symptoms of a stroke. "BE FAST" is an easy way to remember the main warning signs of a stroke: B - Balance. Signs are dizziness, sudden trouble walking, or loss of balance. E - Eyes. Signs are trouble seeing or a sudden change in vision. F - Face. Signs are sudden weakness or numbness of the face, or the face or eyelid drooping on one side. A - Arms. Signs are weakness or  numbness in an arm. This happens suddenly and usually on one side of the body. S - Speech. Signs are sudden trouble speaking, slurred speech, or trouble understanding what people say. T - Time. Time to call emergency services. Write down what time symptoms started. Other signs of a stroke, such as: A sudden, severe headache with no known cause. Nausea or  vomiting. Seizure. These symptoms may represent a serious problem that is an emergency. Do not wait to see if the symptoms will go away. Get medical help right away. Call your local emergency services (911 in the [place].S[place] Do not drive yourself to the hospital. Summary Atrial fibrillation is a type of irregular or rapid heartbeat (arrhythmia). Symptoms include a feeling that your heart is beating fast or irregularly. You may be given medicines to prevent blood clots or to treat heart rate or heart rhythm problems. Get help right away if you have signs or symptoms of a stroke. Get help right away if you cannot catch your breath or have chest pain or pressure. This information is not intended to replace advice given to you by your health care provider. Make sure you discuss any questions you have with your health care provider. Document Revised: 05/05/2019 Document Reviewed: 05/05/2019 Elsevier Patient Education  [place].

## 2022-05-06 ENCOUNTER — Telehealth: Payer: Self-pay | Admitting: Internal Medicine

## 2022-05-06 ENCOUNTER — Ambulatory Visit
Admission: RE | Admit: 2022-05-06 | Discharge: 2022-05-06 | Disposition: A | Discharge status: Home / Self Care | Payer: PPO | Source: Ambulatory Visit | Attending: Internal Medicine | Admitting: Internal Medicine

## 2022-05-06 DIAGNOSIS — J479 Bronchiectasis, uncomplicated: Secondary | ICD-10-CM

## 2022-05-06 DIAGNOSIS — I2510 Atherosclerotic heart disease of native coronary artery without angina pectoris: Secondary | ICD-10-CM | POA: Diagnosis not present

## 2022-05-06 DIAGNOSIS — I700 Atherosclerosis of aorta: Secondary | ICD-10-CM | POA: Diagnosis not present

## 2022-05-06 DIAGNOSIS — R918 Other nonspecific abnormal finding of lung field: Secondary | ICD-10-CM | POA: Diagnosis not present

## 2022-05-06 NOTE — Telephone Encounter (Signed)
Patient would like his current medication list faxed to [facility] in [place],   [place] #  [phone].  He also needs copies of office notes sent to the same person - date range:  11/25/2021 - present. Patient states that the [facility] will now take over his medication.

## 2022-05-06 NOTE — Telephone Encounter (Signed)
Noted.. Faxed last 3 ov and med list to [facility] in [place].Marland KitchenJohny Christensen

## 2022-05-08 NOTE — Telephone Encounter (Signed)
Atc patient to get fax number. Phone note has an inaccurate fax as it has an extra digit. VM was full.

## 2022-05-10 NOTE — Telephone Encounter (Signed)
ATC patient to confirm the fax number for the [facility] in [place], [place]

## 2022-05-10 NOTE — Progress Notes (Signed)
Called pt and there was no answer-LMTCB °

## 2022-05-13 NOTE — Telephone Encounter (Signed)
Called and spoke with patient. He is aware that I have faxed the documents to the [facility] in [place]. I also advised him of his CT scan results. He wants to proceed with the sputum testing. I advised him that I would leave the cups and instructions at the front desk for him. He verbalized understanding.   Nothing further needed at time of call.

## 2022-05-16 ENCOUNTER — Telehealth: Payer: PPO

## 2022-05-16 ENCOUNTER — Telehealth: Payer: Self-pay | Admitting: Internal Medicine

## 2022-05-16 NOTE — Telephone Encounter (Signed)
Spoke to patient.  He wanted to make Dr. Melvyn Novas aware that he was unable to collect sputum sample, as he was unable to produce sputum. He tried two different mornings and was unsuccessful.   Routing to Dr. Melvyn Novas to make aware.

## 2022-05-21 DIAGNOSIS — S46911A Strain of unspecified muscle, fascia and tendon at shoulder and upper arm level, right arm, initial encounter: Secondary | ICD-10-CM | POA: Diagnosis not present

## 2022-05-21 DIAGNOSIS — M13811 Other specified arthritis, right shoulder: Secondary | ICD-10-CM | POA: Diagnosis not present

## 2022-05-21 DIAGNOSIS — M25511 Pain in right shoulder: Secondary | ICD-10-CM | POA: Diagnosis not present

## 2022-05-22 ENCOUNTER — Ambulatory Visit: Payer: PPO | Admitting: *Deleted

## 2022-05-22 DIAGNOSIS — I10 Essential (primary) hypertension: Secondary | ICD-10-CM

## 2022-05-22 DIAGNOSIS — I5032 Chronic diastolic (congestive) heart failure: Secondary | ICD-10-CM

## 2022-05-22 DIAGNOSIS — I480 Paroxysmal atrial fibrillation: Secondary | ICD-10-CM

## 2022-05-22 NOTE — Chronic Care Management (AMB) (Addendum)
Chronic Care Management   CCM RN Visit Note  05/22/2022 Name: [name] Christensen MRN: [id number] DOB: [birth year]  Subjective: [name] Christensen is a 88 y.o. year old male who is a primary care patient of Plotnikov, Georgina Quint, MD. The care management team was consulted for assistance with disease management and care coordination needs.    Engaged with patient by telephone for follow up visit/ CCM RN CM case closure in response to provider referral for case management and/or care coordination services.   Consent to Services:  The patient was given information about Chronic Care Management services, agreed to services, and gave verbal consent prior to initiation of services.  Please see initial visit note for detailed documentation.  Patient agreed to services and verbal consent obtained.   Assessment: Review of patient past medical history, allergies, medications, health status, including review of consultants reports, laboratory and other test data, was performed as part of comprehensive evaluation and provision of chronic care management services.   SDOH (Social Determinants of Health) assessments and interventions performed:  SDOH Interventions    Flowsheet Row Most Recent Value  SDOH Interventions   Food Insecurity Interventions Intervention Not Indicated  [continues to deny food insecurity]  Housing Interventions Intervention Not Indicated  [continues residing with spouse in single family home,  denies housing concerns]  Transportation Interventions Intervention Not Indicated  [reports continues driving self,  family/ son assists as indicated]      CCM Care Plan  Allergies  Allergen Reactions   Ezetimibe Other (See Comments)    Pt was in coma for 6 days, numbness   Penicillins Other (See Comments)    BLISTERS BETWEEN FINGERS Did it involve swelling of the face/tongue/throat, SOB, or low BP? No Did it involve sudden or severe rash/hives, skin peeling, or any reaction on the inside of  your mouth or nose? Yes Did you need to seek medical attention at a hospital or doctor's office? No When did it last happen?      60 years If all above answers are "NO", may proceed with cephalosporin use.    Pravastatin Sodium Other (See Comments)    Aches and pains in joints   Rosuvastatin Other (See Comments)    MYALGIAS   Niacin Anxiety and Other (See Comments)    "Makes me feel nervous, jittery"   Outpatient Encounter Medications as of 05/22/2022  Medication Sig   acetaminophen (TYLENOL) 500 MG tablet Take 500 mg by mouth at bedtime.   alfuzosin (UROXATRAL) 10 MG 24 hr tablet Take 10 mg by mouth every evening.    atorvastatin (LIPITOR) 40 MG tablet TAKE ONE-HALF TABLET BY MOUTH AT BEDTIME FOR HIGH CHOLESTEROL   benzonatate (TESSALON) 100 MG capsule benzonatate 100 mg capsule  TAKE 1 CAPSULE (100 MG TOTAL) BY MOUTH EVERY 8 (EIGHT) HOURS (Patient not taking: Reported on 04/23/2022)   betamethasone dipropionate 0.05 % cream Apply 1 application. topically 2 (two) times daily as needed.   cefdinir (OMNICEF) 300 MG capsule cefdinir 300 mg capsule (Patient not taking: Reported on 04/23/2022)   cinacalcet (SENSIPAR) 30 MG tablet Take 1 tablet (30 mg total) by mouth 3 (three) times a week.   Dextromethorphan-guaiFENesin (MUCINEX DM PO) Take 30 mg by mouth in the morning and at bedtime.   dofetilide (TIKOSYN) 250 MCG capsule TAKE ONE CAPSULE BY MOUTH TWICE A DAY   dutasteride (AVODART) 0.5 MG capsule Take 0.5 mg by mouth every evening.    ELIQUIS 5 MG TABS tablet TAKE 1 TABLET BY  MOUTH TWICE (2) DAILY   escitalopram (LEXAPRO) 10 MG tablet TAKE 1 TABLET BY MOUTH ONCE DAILY   famotidine (PEPCID) 20 MG tablet Take 20 mg by mouth daily.   HYDROcodone bit-homatropine (HYCODAN) 5-1.5 MG/5ML syrup TAKE 5 MLS BY MOUTH EVERY 6 HOURS AS NEEDED FOR COUGH   metoprolol succinate (TOPROL-XL) 50 MG 24 hr tablet TAKE 1 TABLET BY MOUTH TWICE A DAY. TAKEWITH OR IMMEDIATELY FOLLOWING A MEAL   Multiple Vitamin  (MULTIVITAMIN WITH MINERALS) TABS tablet Take 1 tablet by mouth every other day.   nitroGLYCERIN (NITROSTAT) 0.4 MG SL tablet DISSOLVE 1 TABLET UNDER TONGUE AS NEEDEDFOR CHEST PAIN. MAY REPEAT 5 MINUTES APART 3 TIMES IF NEEDED   omeprazole (PRILOSEC) 20 MG capsule Take 20 mg by mouth daily.   Plecanatide 3 MG TABS TAKE ONE TABLET BY MOUTH ONCE EVERY DAY FOR CONSTIPATION *REPLACES LINACLOTIDE (LINZESS)*   Polyethyl Glycol-Propyl Glycol (SYSTANE OP) Place 1 drop into both eyes daily as needed (burning eyes).    triamcinolone cream (KENALOG) 0.1 % APPLY 1 APPLICATION TOPICALLY TWO TIMES DAILY AS NEEDED AVOIDING FACE, GROIN ANDARMPITS   valsartan (DIOVAN) 80 MG tablet Take 1 tablet (80 mg) by mouth once daily   vitamin B-12 (CYANOCOBALAMIN) 1000 MCG tablet Take 1,000 mcg by mouth daily.   No facility-administered encounter medications on file as of 05/22/2022.   Patient Active Problem List   Diagnosis Date Noted   Cardiac pacemaker in situ 04/02/2022   Chronic idiopathic constipation 04/02/2022   Heart failure (HCC) 04/02/2022   Problem related to unspecified psychosocial circumstances 04/02/2022   Sensorineural hearing loss, bilateral 04/02/2022   Vitamin B12 deficiency (non anemic) 04/02/2022   Upper airway cough syndrome 03/26/2022   Systolic and diastolic CHF, chronic (HCC) 03/26/2022   Anemia 09/25/2021   Osteoporosis 07/26/2021   Post-COVID chronic fatigue 07/26/2021   Diarrhea 07/03/2021   COVID-19 06/10/2021   Low blood pressure 06/10/2021   Aortic atherosclerosis (HCC) 04/02/2021   Apathy 04/02/2021   Memory problem 03/01/2021   Contusion of left shoulder 01/07/2021   Chronic renal insufficiency, stage 3 (moderate) (HCC) 01/01/2021   Persistent atrial fibrillation (HCC) 07/05/2019   Ischemic cardiomyopathy    Unstable angina (HCC)    Upper respiratory infection 09/23/2018   Hypocalciuric hypercalcemia 09/16/2018   Fall 08/17/2018   Facial pain 08/17/2018   Hematuria  05/20/2018   Paroxysmal atrial fibrillation (HCC)    Bronchiectasis (HCC) assoc with possible MAI 03/11/2018   Dyspnea on exertion 03/10/2018   Chronic diastolic CHF (congestive heart failure) (HCC) 03/09/2018   Atrial flutter (HCC) 03/09/2018   PVC's (premature ventricular contractions) 03/09/2018   Arthralgia 01/29/2018   Dry skin 12/22/2017   Carotid stenosis, asymptomatic, bilateral 12/22/2017   Chills with fever 11/24/2017   Dysuria 11/24/2017   Anxiety disorder 11/24/2017   Hepatic cirrhosis (HCC) 06/05/2017   Chronic calculous cholecystitis 06/05/2017   Weight loss 04/24/2017   Sinus node dysfunction (HCC) 04/07/2017   Chronic cholecystitis s/p lap cholecystectomy 06/05/2017 02/24/2017   Pleural effusion 02/24/2017   Dyslipidemia 08/05/2016   Fatigue 04/09/2016   Constipation 08/09/2015   Cough in adult 11/30/2014   LBP (low back pain) 08/30/2013   Chest pain, atypical 07/21/2013   Lung infiltrate 07/19/2013   Ventricular vs Supraventricular bigeminy 04/09/2013   Implantable cardioverter-defibrillator (ICD) in situ 11/13/2012   CAP (community acquired pneumonia) 11/17/2012   Sudden cardiac death (HCC) 10/27/2012   CORONARY ARTERY BYPASS GRAFT, HX OF 10/11/2008   Insomnia 12/07/2007   Essential hypertension  09/01/2007   CAD (coronary artery disease) 09/01/2007   GERD 09/01/2007   BPH (benign prostatic hyperplasia) 09/01/2007   Conditions to be addressed/monitored:  Atrial Fibrillation and HTN  Care Plan : RN Care Manager Plan of Care  Updates made by Michaela Corner, RN since 05/22/2022 12:00 AM     Problem: Chronic Disease Management Needs   Priority: Medium     Long-Range Goal: Ongoing adherence to established plan of care for long term chronic disease management   Start Date: 09/06/2021  Expected End Date: 09/06/2022  Priority: Medium  Note:   Current Barriers:  Chronic Disease Management support and education needs related to CHF, HTN, and post-Covid  syndrome 12/04/21: Spouse had recent head injury requiring surgery (November 2022)- patient has been under stress related to wife's injury: reports getting slowly better as wife recuperates; reports recuperating "well" and getting better "every day"-- follow up 01/02/22: reports wife "much better;" states wife is easily tired, but has resumed her normal ADL/iADL's  RNCM Clinical Goal(s):  Patient will demonstrate ongoing health management independence HTN; CHF; post-Covid syndrome  through collaboration with RN Care manager, provider, and care team.   Interventions: 1:1 collaboration with primary care provider regarding development and update of comprehensive plan of care as evidenced by provider attestation and co-signature Inter-disciplinary care team collaboration (see longitudinal plan of care) Evaluation of current treatment plan related to  self management and patient's adherence to plan as established by provider Review of patient status, including review of consultants reports, relevant laboratory and other test results, and medications completed SDOH updated: no new/ unmet concerns identified Depression screening updated: no concerns identified Pain assessment updated: denies pain today- but reports "sore" after fall at home without injury yesterday Falls assessment updated: reports new/ recent fall without serious injury "yesterday:" states he was getting out of bed and "just fell on the floor;" he denies loss of consciousness, thinks he may have tripped on robe; reports he was able to attend scheduled appointment after falling at [place]- his son drove him, and then he immediately went to [facility]- had x-rays "which showed no fractures;" he is considering starting PT at [facility]; continues not using assistive devices on regular basis; provided encouragement to continue efforts at fall prevention; previously provided education around fall risks/ prevention reinforced Medications  discussed: reports continues to independently self-manage and denies current concerns/ issues/ questions around medications; endorses adherence to taking all medications as prescribed Reviewed upcoming scheduled provider appointments: 07/03/22- PCP; patient confirms is aware of all and has plans to attend as scheduled Discussed plans with patient for ongoing care management follow up- patient denies current care coordination/ care management needs and is agreeable to CCM RN CM case closure today; verbalizes understanding to contact PCP or other care providers for any needs that arise in the future, and confirms he has contact information for all care providers     Post-COVID syndrome:  (Status: 05/22/22- Goal Met.)  Long-Term goal Reports "cough seems better, finally;"  Encouraged ongoing activity as tolerated, especially around recent fall, and reminded patient to pace self and not over-do- encouraged his engagement with PT services through [facility] Reviewed with patient importance of daily weights in setting of CHF; he verbalizes a good understanding of rationale and confirms he continues monitoring daily- reviewed recent weights at home: he reports general values between 130-134 lbs, with a weight today of 129.8 lbs Confirmed no clinical concerns around shortness of breath outside of baseline, swelling- denies  Reviewed established action plan for weight gain- he verbalizes very good understanding of same  Hypertension: (Status: 05/22/22- Goal Met.) Long-Term goal Last practice recorded BP readings:  BP Readings from Last 3 Encounters:  11/29/21 138/78  09/25/21 140/70  08/28/21 110/60  Most recent eGFR/CrCl: No results found for: EGFR  No components found for: CRCL  Evaluation of current treatment plan related to hypertension self management and patient's adherence to plan as established by provider;   Discussed complications of poorly controlled blood pressure such as heart disease, stroke,  circulatory complications, vision complications, kidney impairment, sexual dysfunction;  Discussed and reviewed recent blood pressures at home, post- recent acute outreach wit myself on 04/30/22, at which time her reported high heart rates and blood pressures; today he reports blood pressures and heart rates "pretty good;" but tells me about an episode on 05/06/22 when his blood pressure at home was 65/40 with associated heart rate of 137; he re-checked and obtained reading of 78/90, heart rate 135-- confirmed that he "did not really fell bad or different" during this episode; states the episode spontaneously resolved without specific intervention, and provides recent blood pressure/ heart rate readings of "151/84, HR 75 and 143/76, HR 70" Provided education around signs/ symptoms AF and pathophysiology around how high heart rates often make low blood pressures: encouraged him to contact care providers/ seek urgent/ emergent care should subsequent episodes occur, especially if he has symptoms that do not promptly subside- he verbalizes understanding and agreement with same Reinforced previously provided education around signs/ symptoms AF along with corresponding action plan- he verbalizes very good understanding of same Reinforced previously provided verbal education re: signs/ symptoms yellow CHF zone along with corresponding action plan; today, he denies clinical concerns around breathing status, lower extremity/ abdominal swelling and sounds to be in no distress throughout phone call today     Plan: No further follow up required:  patient denies current care coordination/ care management needs and is agreeable to CCM RN CM case closure today; CCM RN CM case closure accordingly     Caryl Pina, RN, BSN, CCRN Alumnus [facility] RN Care Coordination- [facility] [place] [phone]: direct office      Medical screening examination/treatment/procedure(s) were performed by non-physician  practitioner and as supervising physician I was immediately available for consultation/collaboration.  I agree with above. Jacinta Shoe, MD

## 2022-05-22 NOTE — Progress Notes (Unsigned)
Subjective:   Patient ID: [name] Christensen, male    DOB: 04-Feb-1932  MRN: [id number]   Brief patient profile:  6 yowm never smoker s/p cardiac arrest Oct 31 2012 requiring artic sun and ever since Nov 10 2012  placement of debrillator every deep breath makes him cough> eval with ? pna 12/14/12  > rx levaquin some better  referred 12/16/2012 to pulmonary clinic by Dr Posey Rea with dx of bronchiectasis on ct chest 02/04/18    History of Present Illness  12/16/2012 1st pulmonary eval cc intermittent chills, severe cough since Dec 17 when takes breath in started levquin 1/20with ? pna and starting to feel better, sob only with exertion, no excess mucus production. rec Take zantac (ranitidine) 150 mg after bfast and after supper whether you think you need it or not for now GERD  For cough use tramadol 50 mg one every 4 hours if needed     01/28/2013 f/u ov/[name] Christensen cc completely back to baseline, no cough or sob, not taking any cough meds for a week, still on the zantac at hs. rec Don't stop the zantac until 100% better for at least a week without the need for any cough medication.     04/10/2018  f/u ov/[name] Christensen re: abn ct c/w bronchiectasis  Chief Complaint  Patient presents with   Follow-up    Breathing is markedly improved since last OV. Denies chest tightness, wheezing, SOB or coughing at this time.  Dyspnea:  Work out at gym 2.5-2.8 mph at 4 degrees x one mile  Cough: none Sleep: lie flat hs  now Leg swelling resolved  Rec Follow up is prn    03/25/2022  Re-establish ov/[name] Christensen re: bronchiectasis/ cough out of control on entresto since  08/15/20 and really bad cough since first of year 2023 no better with rx for CAP by Plotnikov  Chief Complaint  Patient presents with   Consult    Cough  Dyspnea:  still  working doing treadmill  x one mile x 24  min slt elevation  Cough: worst 1st thing in am and at hs  > min white phlegm Sleeping: flat bed and one pillow no noct  resp cc  SABA use:  none 02: none  Covid status:   vax x 3  Rec For cough > mucinex dm  1200 mg every 12 hours as needed Stop entresto and replace valsartan 80 mg twice daily  Try prilosec otc 20mg   Take 30-60 min before first meal of the day and Pepcid ac (famotidine) 20 mg one @  bedtime until cough is completely gone for at least a week without the need for cough suppression GERD diet reviewed, bed blocks rec  Please schedule a follow up office visit in 6 weeks, call sooner if needed in [place] office  Late Add:  recheck K in one week /HRCT before next ov    05/23/2022  f/u ov/[name] Christensen re: ***   maint on ***  No chief complaint on file.   Dyspnea:  *** Cough: *** Sleeping: *** SABA use: *** 02: *** Covid status:   ***   No obvious day to day or daytime variability or assoc excess/ purulent sputum or mucus plugs or hemoptysis or cp or chest tightness, subjective wheeze or overt sinus or hb symptoms.   *** without nocturnal  or early am exacerbation  of respiratory  c/o's or need for noct saba. Also denies any obvious fluctuation of symptoms with weather or environmental changes or other aggravating  or alleviating factors except as outlined above   No unusual exposure hx or h/o childhood pna/ asthma or knowledge of premature birth.  Current Allergies, Complete Past Medical History, Past Surgical History, Family History, and Social History were reviewed in [employer] record.  ROS  The following are not active complaints unless bolded Hoarseness, sore throat, dysphagia, dental problems, itching, sneezing,  nasal congestion or discharge of excess mucus or purulent secretions, ear ache,   fever, chills, sweats, unintended wt loss or wt gain, classically pleuritic or exertional cp,  orthopnea pnd or arm/hand swelling  or leg swelling, presyncope, palpitations, abdominal pain, anorexia, nausea, vomiting, diarrhea  or change in bowel habits or change in bladder habits, change in stools or  change in urine, dysuria, hematuria,  rash, arthralgias, visual complaints, headache, numbness, weakness or ataxia or problems with walking or coordination,  change in mood or  memory.        No outpatient medications have been marked as taking for the 05/23/22 encounter (Appointment) with [name] Cowden, MD.                           Objective:   Physical Exam  Wts  05/23/2022    ***  03/25/2022      136  04/10/2018   146   12/31/2012     146      12/16/12 145 lb 3.2 oz (65.862 kg)  12/14/12 145 lb (65.772 kg)  11/28/2012 144 lb (65.318 kg)     Vital signs reviewed  05/23/2022  - Note at rest 02 sats  ***% on ***   General appearance:    ***      Min bar ***                    Assessment & Plan:

## 2022-05-23 ENCOUNTER — Ambulatory Visit (INDEPENDENT_AMBULATORY_CARE_PROVIDER_SITE_OTHER): Payer: PPO | Admitting: Internal Medicine

## 2022-05-23 ENCOUNTER — Encounter: Payer: Self-pay | Admitting: Internal Medicine

## 2022-05-23 DIAGNOSIS — J479 Bronchiectasis, uncomplicated: Secondary | ICD-10-CM

## 2022-05-23 NOTE — Patient Instructions (Signed)
If mucus turns nasty we need to get a sample and send it for rountine stain and culture and afb stain and culture  Please schedule a follow up visit in 6 months but call sooner if needed

## 2022-05-23 NOTE — Assessment & Plan Note (Signed)
See CT chest 02/04/18  - PFT's  04/10/2018  FEV1 2.13 (90 % ) ratio 73  p 6 % improvement from saba p nothing prior to study with DLCO  78/85c% corrects to 108  % for alv volume - HRCT  05/06/22  C/w bronchiectasis ? mai > check am sputum. > not don eby 05/23/2022 > try again when mucus gets nasty  Reviewed again pathophysiology of bronchiectasis and assoc with gerd> at this point not having flares but when he does needs routine culture and AFB studies  F/u in 6 m, sooner prn           Each maintenance medication was reviewed in detail including emphasizing most importantly the difference between maintenance and prns and under what circumstances the prns are to be triggered using an action plan format where appropriate.  Total time for H and P, chart review, counseling,   and generating customized AVS unique to this office visit / same day charting =  94mn

## 2022-05-24 DIAGNOSIS — I5032 Chronic diastolic (congestive) heart failure: Secondary | ICD-10-CM

## 2022-05-24 DIAGNOSIS — M25511 Pain in right shoulder: Secondary | ICD-10-CM | POA: Diagnosis not present

## 2022-05-24 DIAGNOSIS — I480 Paroxysmal atrial fibrillation: Secondary | ICD-10-CM

## 2022-05-24 DIAGNOSIS — I10 Essential (primary) hypertension: Secondary | ICD-10-CM

## 2022-05-30 ENCOUNTER — Other Ambulatory Visit: Payer: Self-pay | Admitting: Internal Medicine

## 2022-06-04 LAB — CUP PACEART INCLINIC DEVICE CHECK
Battery Remaining Longevity: 37 mo
Brady Statistic RA Percent Paced: 50 %
Brady Statistic RV Percent Paced: 7.1 %
Date Time Interrogation Session: 20230530100700
HighPow Impedance: 69.75 Ohm
Implantable Lead Implant Date: 20131218
Implantable Lead Implant Date: 20180514
Implantable Lead Location: 753859
Implantable Lead Location: 753860
Implantable Lead Model: 5076
Implantable Pulse Generator Implant Date: 20180514
Lead Channel Impedance Value: 400 Ohm
Lead Channel Impedance Value: 462.5 Ohm
Lead Channel Pacing Threshold Amplitude: 0.5 V
Lead Channel Pacing Threshold Amplitude: 0.5 V
Lead Channel Pacing Threshold Amplitude: 1.25 V
Lead Channel Pacing Threshold Amplitude: 1.25 V
Lead Channel Pacing Threshold Pulse Width: 0.5 ms
Lead Channel Pacing Threshold Pulse Width: 0.5 ms
Lead Channel Pacing Threshold Pulse Width: 0.5 ms
Lead Channel Pacing Threshold Pulse Width: 0.5 ms
Lead Channel Sensing Intrinsic Amplitude: 4 mV
Lead Channel Sensing Intrinsic Amplitude: 4.5 mV
Lead Channel Setting Pacing Amplitude: 2 V
Lead Channel Setting Pacing Amplitude: 2.5 V
Lead Channel Setting Pacing Pulse Width: 0.5 ms
Lead Channel Setting Sensing Sensitivity: 0.5 mV
Pulse Gen Serial Number: 1245762

## 2022-06-07 ENCOUNTER — Ambulatory Visit: Payer: PPO | Admitting: Internal Medicine

## 2022-06-07 DIAGNOSIS — M25511 Pain in right shoulder: Secondary | ICD-10-CM | POA: Diagnosis not present

## 2022-06-07 DIAGNOSIS — S46911A Strain of unspecified muscle, fascia and tendon at shoulder and upper arm level, right arm, initial encounter: Secondary | ICD-10-CM | POA: Diagnosis not present

## 2022-06-11 ENCOUNTER — Encounter: Payer: Self-pay | Admitting: Internal Medicine

## 2022-06-13 DIAGNOSIS — M25511 Pain in right shoulder: Secondary | ICD-10-CM | POA: Diagnosis not present

## 2022-06-15 ENCOUNTER — Other Ambulatory Visit: Payer: Self-pay | Admitting: Internal Medicine

## 2022-06-17 ENCOUNTER — Other Ambulatory Visit: Payer: PPO

## 2022-06-17 DIAGNOSIS — J479 Bronchiectasis, uncomplicated: Secondary | ICD-10-CM

## 2022-06-18 ENCOUNTER — Ambulatory Visit (INDEPENDENT_AMBULATORY_CARE_PROVIDER_SITE_OTHER): Payer: PPO | Admitting: Internal Medicine

## 2022-06-18 ENCOUNTER — Encounter: Payer: Self-pay | Admitting: Internal Medicine

## 2022-06-18 DIAGNOSIS — I5042 Chronic combined systolic (congestive) and diastolic (congestive) heart failure: Secondary | ICD-10-CM | POA: Diagnosis not present

## 2022-06-18 DIAGNOSIS — J479 Bronchiectasis, uncomplicated: Secondary | ICD-10-CM

## 2022-06-18 DIAGNOSIS — M25511 Pain in right shoulder: Secondary | ICD-10-CM | POA: Diagnosis not present

## 2022-06-18 NOTE — Progress Notes (Signed)
Subjective:   Patient ID: [name] Christensen, male    DOB: 09/23/1932  MRN: [id number]   Brief patient profile:  88 yowm never smoker s/p cardiac arrest Oct 31 2012 requiring artic sun and ever since Nov 10 2012  placement of debrillator every deep breath makes him cough> eval with ? pna 12/14/12  > rx levaquin some better  referred 12/16/2012 to pulmonary clinic by Dr Posey Rea with dx of bronchiectasis on ct chest 02/04/18    History of Present Illness  12/16/2012 1st pulmonary eval cc intermittent chills, severe cough since Dec 17 when takes breath in started levquin 1/20with ? pna and starting to feel better, sob only with exertion, no excess mucus production. rec Take zantac (ranitidine) 150 mg after bfast and after supper whether you think you need it or not for now GERD  For cough use tramadol 50 mg one every 4 hours if needed     01/28/2013 f/u ov/[name] Christensen cc completely back to baseline, no cough or sob, not taking any cough meds for a week, still on the zantac at hs. rec Don't stop the zantac until 100% better for at least a week without the need for any cough medication.     04/10/2018  f/u ov/[name] Christensen re: abn ct c/w bronchiectasis  Chief Complaint  Patient presents with   Follow-up    Breathing is markedly improved since last OV. Denies chest tightness, wheezing, SOB or coughing at this time.  Dyspnea:  Work out at gym 2.5-2.8 mph at 4 degrees x one mile   Cough: none Sleep: lie flat hs  now Leg swelling resolved  Rec Follow up is prn    03/25/2022  Re-establish ov/[name] Christensen re: bronchiectasis/ cough out of control on entresto since  08/15/20 and really bad cough since first of year 2023 no better with rx for CAP by Plotnikov  Chief Complaint  Patient presents with   Consult    Cough  Dyspnea:  still  working out doing treadmill  x one mile x 24  min slt elevation  Cough: worst 1st thing in am and at hs  > min white phlegm Sleeping: flat bed and one pillow no noct  resp cc  SABA use: none 02: none  Covid status:   vax x 3  Rec For cough > mucinex dm  1200 mg every 12 hours as needed Stop entresto and replace valsartan 80 mg twice daily  Try prilosec otc 20mg   Take 30-60 min before first meal of the day and Pepcid ac (famotidine) 20 mg one @  bedtime until cough is completely gone for at least a week without the need for cough suppression GERD diet reviewed, bed blocks rec        05/23/2022  f/u ov/[name] Christensen re: bronchiectasis - no maint rx   Chief Complaint  Patient presents with   Follow-up    Pt states he is seeing some improvement with breathing and his cough.   Dyspnea:  back on treadmill ok same routine as before tol well  Cough: tolerable now / dry worse in am Sleeping: flat bed one pillow  SABA use: none  02: none  Covid status:   vax x 3  Rec Sputum culture > done 06/17/22    06/18/2022  f/u ov/[name] Christensen re: bronchiectasis   Chief Complaint  Patient presents with   Follow-up    Pt states he is doing better than he was at LOV. SOB with exertion.  Dyspnea:  still walking up to  5 x week x one mile in 27 min on a treadmill x slight elevation "up to 10%"  Cough: most productive first thing in am /mucoid/ gen cp ant with cough, no pleuritic or exertional qualities  Sleeping: flat bed one pillow SABA use: none 02: none  Covid status:   none    No obvious day to day or daytime variability or assoc  purulent sputum or mucus plugs or  hemoptysis or  chest tightness, subjective wheeze or overt sinus or hb symptoms.   Sleeping  without nocturnal   exacerbation  of respiratory  c/o's or need for noct saba. Also denies any obvious fluctuation of symptoms with weather or environmental changes or other aggravating or alleviating factors except as outlined above   No unusual exposure hx or h/o childhood pna/ asthma or knowledge of premature birth.  Current Allergies, Complete Past Medical History, Past Surgical History, Family History, and Social History were reviewed in [employer] record.  ROS  The following are not active complaints unless bolded Hoarseness, sore throat, dysphagia, dental problems, itching, sneezing,  nasal congestion or discharge of excess mucus or purulent secretions, ear ache,   fever, chills, sweats, unintended wt loss or wt gain, classically pleuritic or exertional cp,  orthopnea pnd or arm/hand swelling  or leg swelling, presyncope, palpitations, abdominal pain, anorexia, nausea, vomiting, diarrhea  or change in bowel habits or change in bladder habits, change in stools or change in urine, dysuria, hematuria,  rash, arthralgias, visual complaints, headache, numbness, weakness or ataxia or problems with walking or coordination,  change in mood or  memory.        Current Meds  Medication Sig   acetaminophen (TYLENOL) 500 MG tablet Take 500 mg by mouth at bedtime.   alfuzosin (UROXATRAL) 10 MG 24 hr tablet Take 10 mg by mouth every evening.    atorvastatin (LIPITOR) 40 MG tablet TAKE ONE-HALF TABLET BY MOUTH AT BEDTIME FOR HIGH CHOLESTEROL   benzonatate (TESSALON) 100 MG capsule    betamethasone dipropionate 0.05 % cream Apply 1 application. topically 2 (two) times daily as needed.   cefdinir (OMNICEF) 300 MG capsule    cinacalcet (SENSIPAR) 30 MG tablet Take 1 tablet (30 mg total) by mouth 3 (three) times a week.   Dextromethorphan-guaiFENesin (MUCINEX DM PO) Take 30 mg by mouth in the  morning and at bedtime.   dofetilide (TIKOSYN) 250 MCG capsule TAKE ONE CAPSULE BY MOUTH TWICE A DAY   dutasteride (AVODART) 0.5 MG capsule Take 0.5 mg by mouth every evening.    ELIQUIS 5 MG TABS tablet TAKE 1 TABLET BY MOUTH TWICE (2) DAILY   escitalopram (LEXAPRO) 10 MG tablet TAKE 1 TABLET BY MOUTH ONCE DAILY   famotidine (PEPCID) 20 MG tablet Take 20 mg by mouth daily.   HYDROcodone bit-homatropine (HYCODAN) 5-1.5 MG/5ML syrup TAKE 5 MLS BY MOUTH EVERY 6 HOURS AS NEEDED FOR COUGH   metoprolol succinate (TOPROL-XL) 50 MG 24 hr tablet TAKE 1 TABLET BY MOUTH TWICE A DAY. TAKEWITH OR IMMEDIATELY FOLLOWING A MEAL   Multiple Vitamin (MULTIVITAMIN WITH MINERALS) TABS tablet Take 1 tablet by mouth every other day.   nitroGLYCERIN (NITROSTAT) 0.4 MG SL tablet DISSOLVE 1 TABLET UNDER TONGUE AS NEEDEDFOR CHEST PAIN. MAY REPEAT 5 MINUTES APART 3 TIMES IF NEEDED   omeprazole (PRILOSEC) 20 MG capsule Take 20 mg by mouth daily.   Plecanatide 3 MG TABS TAKE ONE TABLET BY MOUTH ONCE EVERY DAY FOR CONSTIPATION *REPLACES LINACLOTIDE Karlene Einstein)*  Polyethyl Glycol-Propyl Glycol (SYSTANE OP) Place 1 drop into both eyes daily as needed (burning eyes).    triamcinolone cream (KENALOG) 0.1 % APPLY 1 APPLICATION TOPICALLY TWO TIMES DAILY AS NEEDED AVOIDING FACE, GROIN ANDARMPITS   valsartan (DIOVAN) 80 MG tablet Take 1 tablet (80 mg) by mouth once daily   vitamin B-12 (CYANOCOBALAMIN) 1000 MCG tablet Take 1,000 mcg by mouth daily.                            Objective:   Physical Exam  Wts  06/18/2022    134  05/23/2022    147  03/25/2022      136  04/10/2018   146   12/31/2012     146      12/16/12 145 lb 3.2 oz (65.862 kg)  12/14/12 145 lb (65.772 kg)  12/02/2012 144 lb (65.318 kg)     Vital signs reviewed  06/18/2022  - Note at rest 02 sats  97% on RA   General appearance:    amb thin somber wm nad     HEENT : Oropharynx  clear   Nasal turbinates nl   NECK :  without  apparent JVD/ palpable  Nodes/TM    LUNGS: no acc muscle use,  Min barrel  contour chest wall with bilateral  slightly decreased bs s audible wheeze and  without cough on insp or exp maneuvers and min  Hyperresonant  to  percussion bilaterally    CV:  RRR  no s3 or murmur or increase in P2, and no edema   ABD:  soft and nontender with pos end  insp Hoover's  in the supine position.  No bruits or organomegaly appreciated   MS:  Nl gait/ ext warm without deformities Or obvious joint restrictions  calf tenderness, cyanosis or clubbing     SKIN: warm and dry without lesions    NEURO:  alert, approp, nl sensorium with  no motor or cerebellar deficits apparent.              I personally reviewed images and agree with radiology impression as follows:   Chest CT 05/06/22  Widespread moderate cylindrical and varicoid bronchiectasis scattered throughout both lungs, with associated extensive patchy tree-in-bud opacities and scattered patchy regions of peripheral peribronchovascular consolidation. Findings have progressed since 02/04/2018 chest CT, particularly in the lower lobes. 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          Assessment & Plan:

## 2022-06-18 NOTE — Patient Instructions (Signed)
No change in medications   For cough > mucinex dm 1200 mg every 12 hours as needed    Please schedule a follow up office visit in 6 weeks, call sooner if needed with pfts on return

## 2022-06-18 NOTE — Assessment & Plan Note (Signed)
See CT chest 02/04/18  - PFT's  04/10/2018  FEV1 2.13 (90 % ) ratio 73  p 6 % improvement from saba p nothing prior to study with DLCO  78/85c% corrects to 108  % for alv volume - HRCT  05/06/22  C/w bronchiectasis ? mai > check am sputum. > not don eby 05/23/2022 > try again when mucus gets nasty > done 06/17/22  - CT chest 05/06/22 progressed c/w MAI   Need to complete the w/u with sputa above plus Quant Igs,  Quant TB  ESR and alpha one AT and in meantime rx with mucinex/ add flutter valve training and return for full pfts then can follow in [place] clinic since he lives in [place]         Each maintenance medication was reviewed in detail including emphasizing most importantly the difference between maintenance and prns and under what circumstances the prns are to be triggered using an action plan format where appropriate.  Total time for H and P, chart review, counseling,  ) and generating customized AVS unique to this office visit / same day charting = 30 min

## 2022-06-21 ENCOUNTER — Other Ambulatory Visit: Payer: Self-pay | Admitting: Internal Medicine

## 2022-06-21 DIAGNOSIS — J479 Bronchiectasis, uncomplicated: Secondary | ICD-10-CM

## 2022-06-21 NOTE — Progress Notes (Signed)
Pt aware of recs He still has sputum cup and will produce another sample and take to [facility] orders placed

## 2022-06-25 DIAGNOSIS — M25511 Pain in right shoulder: Secondary | ICD-10-CM | POA: Diagnosis not present

## 2022-07-01 ENCOUNTER — Telehealth: Payer: Self-pay

## 2022-07-01 NOTE — Telephone Encounter (Signed)
Pts wife has stated pt has trouble with his memory, taking meds and sleeping patterns. Pt fell x2 weeks ago and injured his rt shoulder. Pt is in PT for this fall.  *Pt wife just wanted PCP to be aware f the cognitive changes for his up coming visit.

## 2022-07-02 NOTE — Telephone Encounter (Signed)
Noted. Thanks.

## 2022-07-03 ENCOUNTER — Encounter: Payer: Self-pay | Admitting: Internal Medicine

## 2022-07-03 ENCOUNTER — Ambulatory Visit (INDEPENDENT_AMBULATORY_CARE_PROVIDER_SITE_OTHER): Payer: PPO | Admitting: Internal Medicine

## 2022-07-03 ENCOUNTER — Ambulatory Visit (INDEPENDENT_AMBULATORY_CARE_PROVIDER_SITE_OTHER): Payer: PPO

## 2022-07-03 DIAGNOSIS — I255 Ischemic cardiomyopathy: Secondary | ICD-10-CM | POA: Diagnosis not present

## 2022-07-03 DIAGNOSIS — E538 Deficiency of other specified B group vitamins: Secondary | ICD-10-CM

## 2022-07-03 DIAGNOSIS — I5042 Chronic combined systolic (congestive) and diastolic (congestive) heart failure: Secondary | ICD-10-CM | POA: Diagnosis not present

## 2022-07-03 DIAGNOSIS — R059 Cough, unspecified: Secondary | ICD-10-CM | POA: Diagnosis not present

## 2022-07-03 DIAGNOSIS — M25511 Pain in right shoulder: Secondary | ICD-10-CM | POA: Diagnosis not present

## 2022-07-03 DIAGNOSIS — R413 Other amnesia: Secondary | ICD-10-CM | POA: Diagnosis not present

## 2022-07-03 LAB — CUP PACEART REMOTE DEVICE CHECK
Battery Remaining Longevity: 34 mo
Battery Remaining Percentage: 37 %
Battery Voltage: 2.9 V
Brady Statistic AP VP Percent: 1 %
Brady Statistic AP VS Percent: 49 %
Brady Statistic AS VP Percent: 2.3 %
Brady Statistic AS VS Percent: 48 %
Brady Statistic RA Percent Paced: 46 %
Brady Statistic RV Percent Paced: 2.5 %
Date Time Interrogation Session: 20230808073245
HighPow Impedance: 75 Ohm
HighPow Impedance: 75 Ohm
Implantable Lead Implant Date: 20131218
Implantable Lead Implant Date: 20180514
Implantable Lead Location: 753859
Implantable Lead Location: 753860
Implantable Lead Model: 5076
Implantable Pulse Generator Implant Date: 20180514
Lead Channel Impedance Value: 400 Ohm
Lead Channel Impedance Value: 490 Ohm
Lead Channel Pacing Threshold Amplitude: 0.5 V
Lead Channel Pacing Threshold Amplitude: 1.25 V
Lead Channel Pacing Threshold Pulse Width: 0.5 ms
Lead Channel Pacing Threshold Pulse Width: 0.5 ms
Lead Channel Sensing Intrinsic Amplitude: 5 mV
Lead Channel Sensing Intrinsic Amplitude: 5 mV
Lead Channel Setting Pacing Amplitude: 2 V
Lead Channel Setting Pacing Amplitude: 2.5 V
Lead Channel Setting Pacing Pulse Width: 0.5 ms
Lead Channel Setting Sensing Sensitivity: 0.5 mV
Pulse Gen Serial Number: 1245762

## 2022-07-03 LAB — COMPREHENSIVE METABOLIC PANEL
ALT: 10 U/L (ref 0–53)
AST: 19 U/L (ref 0–37)
Albumin: 3.9 g/dL (ref 3.5–5.2)
Alkaline Phosphatase: 87 U/L (ref 39–117)
BUN: 23 mg/dL (ref 6–23)
CO2: 32 mEq/L (ref 19–32)
Calcium: 11.1 mg/dL — ABNORMAL HIGH (ref 8.4–10.5)
Chloride: 97 mEq/L (ref 96–112)
Creatinine, Ser: 1.08 mg/dL (ref 0.40–1.50)
GFR: 61.61 mL/min (ref >60.00)
Glucose, Bld: 111 mg/dL — ABNORMAL HIGH (ref 70–99)
Potassium: 4.4 mEq/L (ref 3.5–5.1)
Sodium: 132 mEq/L — ABNORMAL LOW (ref 135–145)
Total Bilirubin: 0.5 mg/dL (ref 0.2–1.2)
Total Protein: 8 g/dL (ref 6.0–8.3)

## 2022-07-03 LAB — CBC WITH DIFFERENTIAL/PLATELET
Basophils Absolute: 0 10*3/uL (ref 0.0–0.1)
Basophils Relative: 0.4 % (ref 0.0–3.0)
Eosinophils Absolute: 0.2 10*3/uL (ref 0.0–0.7)
Eosinophils Relative: 3.3 % (ref 0.0–5.0)
HCT: 33.7 % — ABNORMAL LOW (ref 39.0–52.0)
Hemoglobin: 11.1 g/dL — ABNORMAL LOW (ref 13.0–17.0)
Lymphocytes Relative: 23.2 % (ref 12.0–46.0)
Lymphs Abs: 1.3 10*3/uL (ref 0.7–4.0)
MCHC: 33 g/dL (ref 30.0–36.0)
MCV: 91.2 fl (ref 78.0–100.0)
Monocytes Absolute: 0.4 10*3/uL (ref 0.1–1.0)
Monocytes Relative: 7.8 % (ref 3.0–12.0)
Neutro Abs: 3.7 10*3/uL (ref 1.4–7.7)
Neutrophils Relative %: 65.3 % (ref 43.0–77.0)
Platelets: 135 10*3/uL — ABNORMAL LOW (ref 150.0–400.0)
RBC: 3.7 Mil/uL — ABNORMAL LOW (ref 4.22–5.81)
RDW: 13.9 % (ref 11.5–15.5)
WBC: 5.6 10*3/uL (ref 4.0–10.5)

## 2022-07-03 LAB — VITAMIN B12: Vitamin B-12: 1165 pg/mL — ABNORMAL HIGH (ref 211–911)

## 2022-07-03 LAB — TSH: TSH: 1.59 u[IU]/mL (ref 0.35–5.50)

## 2022-07-03 MED ORDER — DONEPEZIL HCL 5 MG PO TABS: 5.0000 mg | ORAL_TABLET | Freq: Every day | ORAL | 3 refills | Status: DC

## 2022-07-03 MED ORDER — HYDROCODONE BIT-HOMATROP MBR 5-1.5 MG/5ML PO SOLN: 5.0000 mL | Freq: Four times a day (QID) | ORAL | 0 refills | Status: DC | PRN

## 2022-07-03 NOTE — Assessment & Plan Note (Signed)
Delene Loll is being discussed w/Dr [name]

## 2022-07-03 NOTE — Assessment & Plan Note (Signed)
Could be ARB related Hycodan syr prn  Potential benefits of a long term opioids use as well as potential risks (i.e. addiction risk, apnea etc) and complications (i.e. Somnolence, constipation and others) were explained to the patient and were aknowledged.

## 2022-07-03 NOTE — Progress Notes (Signed)
Subjective:  Patient ID: [name] Christensen, male    DOB: 06/04/1932  Age: 88 y.o. MRN: [id number]  CC: 3 month f/u (He would like to discuss his Losartan and cough medicine. )   HPI [name] Christensen presents for chronic cough, anxiety, memory issues, cardiomyopathy Pt fell in July - LOC Here w/wife [name] Christensen  Outpatient Medications Prior to Visit  Medication Sig Dispense Refill   acetaminophen (TYLENOL) 500 MG tablet Take 500 mg by mouth at bedtime.     alfuzosin (UROXATRAL) 10 MG 24 hr tablet Take 10 mg by mouth every evening.      atorvastatin (LIPITOR) 40 MG tablet TAKE ONE-HALF TABLET BY MOUTH AT BEDTIME FOR HIGH CHOLESTEROL     benzonatate (TESSALON) 100 MG capsule      betamethasone dipropionate 0.05 % cream Apply 1 application. topically 2 (two) times daily as needed.     cinacalcet (SENSIPAR) 30 MG tablet Take 1 tablet (30 mg total) by mouth 3 (three) times a week. 40 tablet 3   Dextromethorphan-guaiFENesin (MUCINEX DM PO) Take 30 mg by mouth in the morning and at bedtime.     dofetilide (TIKOSYN) 250 MCG capsule TAKE ONE CAPSULE BY MOUTH TWICE A DAY 180 capsule 0   dutasteride (AVODART) 0.5 MG capsule Take 0.5 mg by mouth every evening.      ELIQUIS 5 MG TABS tablet TAKE 1 TABLET BY MOUTH TWICE (2) DAILY 180 tablet 3   escitalopram (LEXAPRO) 10 MG tablet TAKE 1 TABLET BY MOUTH ONCE DAILY 90 tablet 1   famotidine (PEPCID) 20 MG tablet Take 20 mg by mouth daily.     metoprolol succinate (TOPROL-XL) 50 MG 24 hr tablet TAKE 1 TABLET BY MOUTH TWICE A DAY. TAKEWITH OR IMMEDIATELY FOLLOWING A MEAL 180 tablet 3   Multiple Vitamin (MULTIVITAMIN WITH MINERALS) TABS tablet Take 1 tablet by mouth every other day.     nitroGLYCERIN (NITROSTAT) 0.4 MG SL tablet DISSOLVE 1 TABLET UNDER TONGUE AS NEEDEDFOR CHEST PAIN. MAY REPEAT 5 MINUTES APART 3 TIMES IF NEEDED 25 tablet 3   omeprazole (PRILOSEC) 20 MG capsule Take 20 mg by mouth daily.     Plecanatide 3 MG TABS TAKE ONE TABLET BY MOUTH ONCE EVERY  DAY FOR CONSTIPATION *REPLACES LINACLOTIDE (LINZESS)*     Polyethyl Glycol-Propyl Glycol (SYSTANE OP) Place 1 drop into both eyes daily as needed (burning eyes).      triamcinolone cream (KENALOG) 0.1 % APPLY 1 APPLICATION TOPICALLY TWO TIMES DAILY AS NEEDED AVOIDING FACE, GROIN ANDARMPITS 80 g 0   valsartan (DIOVAN) 80 MG tablet Take 1 tablet (80 mg) by mouth once daily 90 tablet 2   vitamin B-12 (CYANOCOBALAMIN) 1000 MCG tablet Take 1,000 mcg by mouth daily.     cefdinir (OMNICEF) 300 MG capsule      HYDROcodone bit-homatropine (HYCODAN) 5-1.5 MG/5ML syrup TAKE 5 MLS BY MOUTH EVERY 6 HOURS AS NEEDED FOR COUGH 240 mL 0   No facility-administered medications prior to visit.    ROS: Review of Systems  Constitutional:  Negative for appetite change, fatigue and unexpected weight change.  HENT:  Negative for congestion, nosebleeds, sneezing, sore throat and trouble swallowing.   Eyes:  Negative for itching and visual disturbance.  Respiratory:  Positive for cough.   Cardiovascular:  Negative for chest pain, palpitations and leg swelling.  Gastrointestinal:  Negative for abdominal distention, blood in stool, diarrhea and nausea.  Genitourinary:  Negative for frequency and hematuria.  Musculoskeletal:  Negative for back pain,  gait problem, joint swelling and neck pain.  Skin:  Negative for rash.  Neurological:  Negative for dizziness, tremors, speech difficulty and weakness.  Psychiatric/Behavioral:  Negative for agitation, dysphoric mood, sleep disturbance and suicidal ideas. The patient is not nervous/anxious.     Objective:  BP 124/60   Pulse 78   Temp 97.9 F (36.6 C) (Oral)   Ht '5\' 7"'$  (1.702 m)   Wt 134 lb 3.2 oz (60.9 kg)   SpO2 94%   BMI 21.02 kg/m   BP Readings from Last 3 Encounters:  07/03/22 124/60  06/18/22 116/72  05/23/22 120/70    Wt Readings from Last 3 Encounters:  07/03/22 134 lb 3.2 oz (60.9 kg)  06/18/22 134 lb 3.2 oz (60.9 kg)  05/23/22 137 lb 6.4 oz (62.3  kg)    Physical Exam Constitutional:      General: He is not in acute distress.    Appearance: He is well-developed.     Comments: NAD  Eyes:     Conjunctiva/sclera: Conjunctivae normal.     Pupils: Pupils are equal, round, and reactive to light.  Neck:     Thyroid: No thyromegaly.     Vascular: No JVD.  Cardiovascular:     Rate and Rhythm: Normal rate and regular rhythm.     Heart sounds: Normal heart sounds. No murmur heard.    No friction rub. No gallop.  Pulmonary:     Effort: Pulmonary effort is normal. No respiratory distress.     Breath sounds: Normal breath sounds. No wheezing or rales.  Chest:     Chest wall: No tenderness.  Abdominal:     General: Bowel sounds are normal. There is no distension.     Palpations: Abdomen is soft. There is no mass.     Tenderness: There is no abdominal tenderness. There is no guarding or rebound.  Musculoskeletal:        General: No tenderness. Normal range of motion.     Cervical back: Normal range of motion.  Lymphadenopathy:     Cervical: No cervical adenopathy.  Skin:    General: Skin is warm and dry.     Findings: No rash.  Neurological:     Mental Status: He is alert and oriented to person, place, and time.     Cranial Nerves: No cranial nerve deficit.     Motor: No abnormal muscle tone.     Coordination: Coordination normal.     Gait: Gait normal.     Deep Tendon Reflexes: Reflexes are normal and symmetric.  Psychiatric:        Behavior: Behavior normal.        Thought Content: Thought content normal.        Judgment: Judgment normal.     Lab Results  Component Value Date   WBC 6.6 11/29/2021   HGB 11.7 (L) 11/29/2021   HCT 35.4 (L) 11/29/2021   PLT 165.0 11/29/2021   GLUCOSE 92 04/04/2022   CHOL 107 12/30/2019   TRIG 102.0 12/30/2019   HDL 46.80 12/30/2019   LDLCALC 39 12/30/2019   ALT 9 01/30/2022   AST 20 01/30/2022   NA 134 (L) 04/04/2022   K 4.1 04/04/2022   CL 102 04/04/2022   CREATININE 1.20  04/04/2022   BUN 21 04/04/2022   CO2 29 04/04/2022   TSH 2.26 11/29/2021   PSA 12.45 (H) 02/06/2016   INR 1.1 04/02/2017   HGBA1C 6.1 12/30/2019    CT Chest High Resolution  Result Date: 05/06/2022 CLINICAL DATA:  Dyspnea on exertion intermittently with productive cough for 6 months. History of COVID-19 infection in June 2022. Never smoker. History of bronchiectasis. EXAM: CT CHEST WITHOUT CONTRAST TECHNIQUE: Multidetector CT imaging of the chest was performed following the standard protocol without intravenous contrast. High resolution imaging of the lungs, as well as inspiratory and expiratory imaging, was performed. RADIATION DOSE REDUCTION: This exam was performed according to the departmental dose-optimization program which includes automated exposure control, adjustment of the mA and/or kV according to patient size and/or use of iterative reconstruction technique. COMPARISON:  02/04/2018 chest CT.  01/30/2022 chest radiograph. FINDINGS: Cardiovascular: Normal heart size. No significant pericardial effusion/thickening. Stable 2 lead left subclavian ICD with lead tips in the right atrium and right ventricular apex. Three-vessel coronary atherosclerosis status post CABG. Atherosclerotic nonaneurysmal thoracic aorta. Normal caliber pulmonary arteries. Mediastinum/Nodes: No discrete thyroid nodules. Unremarkable esophagus. No axillary adenopathy. A few borderline prominent partially coarsely calcified low right paratracheal, subcarinal and right hilar lymph nodes are unchanged and compatible with prior granulomatous disease. No pathologically enlarged noncalcified mediastinal or discrete hilar nodes on these noncontrast images. Lungs/Pleura: No pneumothorax. No pleural effusion. No acute consolidative airspace disease or lung masses. Widespread moderate cylindrical and varicoid bronchiectasis scattered throughout both lungs involving all lung lobes, with associated extensive patchy tree-in-bud opacities  and scattered patchy regions of peripheral peribronchovascular consolidation at the areas of bronchiectasis, most prominent in the inferior right middle lobe and dependent and basilar lower lobes. Findings have progressed since 02/04/2018 chest CT, particularly in lower lobes. No significant lobular air trapping or evidence of tracheobronchomalacia. No frank honeycombing. Upper abdomen: Cholecystectomy. Inferior segment 4B simple left liver 1.9 cm cyst. Diffusely finely irregular liver surface suggesting cirrhosis. Musculoskeletal: No aggressive appearing focal osseous lesions. Intact sternotomy wires. Moderate thoracic spondylosis. IMPRESSION: 1. Widespread moderate cylindrical and varicoid bronchiectasis scattered throughout both lungs, with associated extensive patchy tree-in-bud opacities and scattered patchy regions of peripheral peribronchovascular consolidation. Findings have progressed since 02/04/2018 chest CT, particularly in the lower lobes. 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 2. Morphologic changes suggestive of cirrhosis. 3. Aortic Atherosclerosis (ICD10-I70.0). Electronically Signed   By: Ilona Sorrel M.D.   On: 05/06/2022 17:28    Assessment & Plan:   Problem List Items Addressed This Visit     Cough in adult    Could be ARB related Hycodan syr prn  Potential benefits of a long term opioids use as well as potential risks (i.e. addiction risk, apnea etc) and complications (i.e. Somnolence, constipation and others) were explained to the patient and were aknowledged.       Ischemic cardiomyopathy    Delene Loll is being discussed w/Dr [name]      Relevant Orders   CBC with Differential/Platelet   Comprehensive metabolic panel   TSH   Vitamin B12   Memory problem    Stable. Options discussed Start Aricept      Systolic and diastolic CHF, chronic ([place])    Entresto is being discussed w/Dr [name]      Relevant Orders    CBC with Differential/Platelet   Comprehensive metabolic panel   TSH   Vitamin B12   Vitamin B12 deficiency (non anemic)    On B12      Relevant Orders   Vitamin B12      Meds ordered this encounter  Medications   donepezil (ARICEPT) 5 MG tablet    Sig: Take 1 tablet (5 mg total)  by mouth at bedtime.    Dispense:  90 tablet    Refill:  3   HYDROcodone bit-homatropine (HYCODAN) 5-1.5 MG/5ML syrup    Sig: Take 5 mLs by mouth every 6 (six) hours as needed for cough.    Dispense:  240 mL    Refill:  0      Follow-up: Return in about 3 months (around 10/03/2022) for a follow-up visit.  Walker Kehr, MD

## 2022-07-03 NOTE — Assessment & Plan Note (Signed)
On B12 

## 2022-07-03 NOTE — Assessment & Plan Note (Signed)
Stable. Options discussed Start Aricept

## 2022-07-04 DIAGNOSIS — M25511 Pain in right shoulder: Secondary | ICD-10-CM | POA: Diagnosis not present

## 2022-07-05 ENCOUNTER — Other Ambulatory Visit: Payer: Self-pay | Admitting: Internal Medicine

## 2022-07-05 NOTE — Assessment & Plan Note (Signed)
Repeat c-Met, PTH, vitamin D levels

## 2022-07-09 DIAGNOSIS — M25511 Pain in right shoulder: Secondary | ICD-10-CM | POA: Diagnosis not present

## 2022-07-11 ENCOUNTER — Telehealth: Payer: Self-pay | Admitting: Internal Medicine

## 2022-07-11 DIAGNOSIS — J479 Bronchiectasis, uncomplicated: Secondary | ICD-10-CM

## 2022-07-11 NOTE — Telephone Encounter (Signed)
No - though I prefer the smartvest

## 2022-07-11 NOTE — Telephone Encounter (Signed)
Per Dr Melvyn Novas sending in order for vest therapy. Paperwork mailed to patient for the vest and will be returned to office for [place] to send order. Nothing further needed

## 2022-07-11 NOTE — Telephone Encounter (Signed)
Called patient back and he states that he is wanting to move forward with the high frequency vest therapy that he and Dr Melvyn Novas discussed.   Sir are you ok with [place] placing the order for the vest. And if so any special instructions?  Please advise sir

## 2022-07-12 DIAGNOSIS — M25511 Pain in right shoulder: Secondary | ICD-10-CM | POA: Diagnosis not present

## 2022-07-15 DIAGNOSIS — M25511 Pain in right shoulder: Secondary | ICD-10-CM | POA: Diagnosis not present

## 2022-07-18 ENCOUNTER — Other Ambulatory Visit: Payer: PPO

## 2022-07-18 DIAGNOSIS — M25511 Pain in right shoulder: Secondary | ICD-10-CM | POA: Diagnosis not present

## 2022-07-18 DIAGNOSIS — J479 Bronchiectasis, uncomplicated: Secondary | ICD-10-CM

## 2022-07-18 NOTE — Addendum Note (Signed)
Addended by: Dessie Coma on: 07/18/2022 10:45 AM   Modules accepted: Orders

## 2022-07-19 LAB — RESPIRATORY CULTURE OR RESPIRATORY AND SPUTUM CULTURE: MICRO NUMBER:: 13826602

## 2022-07-22 ENCOUNTER — Other Ambulatory Visit: Payer: Self-pay

## 2022-07-22 DIAGNOSIS — J479 Bronchiectasis, uncomplicated: Secondary | ICD-10-CM

## 2022-07-23 ENCOUNTER — Ambulatory Visit (INDEPENDENT_AMBULATORY_CARE_PROVIDER_SITE_OTHER): Payer: PPO | Admitting: Internal Medicine

## 2022-07-23 DIAGNOSIS — J479 Bronchiectasis, uncomplicated: Secondary | ICD-10-CM

## 2022-07-23 DIAGNOSIS — M25511 Pain in right shoulder: Secondary | ICD-10-CM | POA: Diagnosis not present

## 2022-07-23 LAB — PULMONARY FUNCTION TEST
DL/VA % pred: 92 %
DL/VA: 3.56 ml/min/mmHg/L
DLCO cor % pred: 79 %
DLCO cor: 16.73 ml/min/mmHg
DLCO unc % pred: 70 %
DLCO unc: 14.81 ml/min/mmHg
FEF 25-75 Post: 1.63 L/sec
FEF 25-75 Pre: 1.23 L/sec
FEF2575-%Change-Post: 32 %
FEF2575-%Pred-Post: 125 %
FEF2575-%Pred-Pre: 94 %
FEV1-%Change-Post: 7 %
FEV1-%Pred-Post: 100 %
FEV1-%Pred-Pre: 93 %
FEV1-Post: 2.15 L
FEV1-Pre: 2.01 L
FEV1FVC-%Change-Post: 5 %
FEV1FVC-%Pred-Pre: 99 %
FEV6-%Change-Post: 1 %
FEV6-%Pred-Post: 100 %
FEV6-%Pred-Pre: 98 %
FEV6-Post: 2.88 L
FEV6-Pre: 2.84 L
FEV6FVC-%Change-Post: 0 %
FEV6FVC-%Pred-Post: 107 %
FEV6FVC-%Pred-Pre: 107 %
FVC-%Change-Post: 1 %
FVC-%Pred-Post: 93 %
FVC-%Pred-Pre: 91 %
FVC-Post: 2.92 L
FVC-Pre: 2.87 L
Post FEV1/FVC ratio: 74 %
Post FEV6/FVC ratio: 99 %
Pre FEV1/FVC ratio: 70 %
Pre FEV6/FVC Ratio: 99 %
RV % pred: 156 %
RV: 4.08 L
TLC % pred: 101 %
TLC: 6.47 L

## 2022-07-23 NOTE — Progress Notes (Signed)
PFT done today. 

## 2022-07-23 NOTE — Progress Notes (Signed)
Spoke with the pt and notified of recommendations from [facility].

## 2022-07-25 DIAGNOSIS — M25511 Pain in right shoulder: Secondary | ICD-10-CM | POA: Diagnosis not present

## 2022-07-30 DIAGNOSIS — M25511 Pain in right shoulder: Secondary | ICD-10-CM | POA: Diagnosis not present

## 2022-08-01 ENCOUNTER — Ambulatory Visit
Admission: RE | Admit: 2022-08-01 | Discharge: 2022-08-01 | Disposition: A | Discharge status: Home / Self Care | Payer: PPO | Source: Ambulatory Visit | Attending: Internal Medicine | Admitting: Internal Medicine

## 2022-08-01 ENCOUNTER — Ambulatory Visit (INDEPENDENT_AMBULATORY_CARE_PROVIDER_SITE_OTHER): Payer: PPO | Admitting: Internal Medicine

## 2022-08-01 ENCOUNTER — Encounter: Payer: Self-pay | Admitting: Internal Medicine

## 2022-08-01 VITALS — BP 120/74 | HR 81 | Temp 97.7°F | Ht 66.0 in | Wt 135.0 lb

## 2022-08-01 DIAGNOSIS — J479 Bronchiectasis, uncomplicated: Secondary | ICD-10-CM | POA: Insufficient documentation

## 2022-08-01 DIAGNOSIS — R058 Other specified cough: Secondary | ICD-10-CM | POA: Diagnosis not present

## 2022-08-01 DIAGNOSIS — M25511 Pain in right shoulder: Secondary | ICD-10-CM | POA: Diagnosis not present

## 2022-08-01 DIAGNOSIS — R059 Cough, unspecified: Secondary | ICD-10-CM | POA: Diagnosis not present

## 2022-08-01 NOTE — Assessment & Plan Note (Signed)
Onset ? Jan 2023  - try off Entresto  03/25/2022  - max gerd rx 08/01/2022 >>>    Also added mucinex dm 1200 mg bid and flutter valve today         Each maintenance medication was reviewed in detail including emphasizing most importantly the difference between maintenance and prns and under what circumstances the prns are to be triggered using an action plan format where appropriate.  Total time for H and P, chart review, counseling, reviewing flutter  device(s) and generating customized AVS unique to this office visit / same day charting =  35 min

## 2022-08-01 NOTE — Assessment & Plan Note (Addendum)
See CT chest 02/04/18  - PFT's  04/10/2018  FEV1 2.13 (90 % ) ratio 73  p 6 % improvement from saba p nothing prior to study with DLCO  78/85c% corrects to 108  % for alv volume - HRCT  05/06/22  C/w bronchiectasis ? mai > check am sputum. > not done by 05/23/2022 > try again when mucus gets nasty > done 06/17/22  - CT chest 05/06/22 progressed c/w MAI  - sputum 06/17/22 rejected as too many squamous cells > repeated 07/18/22 same problem  - PFT's  07/23/22  FEV1 2.15 (100 % ) ratio 0.74  p 7 % improvement from saba p 0 prior to study with DLCO  14.81 (70%)  FV curve min concave     - 08/01/2022 added flutter valve    Not limited by doe/ cough does not have the usual pattern of worse in am's so may have also component of uacs > rx otc gerd meds and f/u in 3 m  If worse on plain cxr consider fob for BAL  Discussed in detail all the  indications, usual  risks and alternatives  relative to the benefits with patient who agrees to proceed with w/u as outlined.     Add: "Patient has has daily productive cough lasting greater than 6 months requiring antibiotic therapy. Flutter valve has been tried, but has not effectively mobilized secretions. Considered and ruled out manual CPT as no consistent skilled caregiver available to perform therapy. CT scan performed on 05/06/2022 confirms bronchiectasis. Starting patient on vest therapy."

## 2022-08-01 NOTE — Patient Instructions (Addendum)
For cough/ congestion > mucinex dm 1200 mg every 12 hours as needed and use the flutter valve as much as you can   Try prilosec otc '20mg'$   Take 30-60 min before first meal of the day and Pepcid ac (famotidine) 20 mg one @  bedtime until return   Please remember to go to the  x-ray department  for your tests - we will call you with the results when they are available   Please schedule a follow up visit in 3 months but call sooner if needed

## 2022-08-01 NOTE — Progress Notes (Signed)
Patient seen in the office today and instructed on use of flutter valve.  Patient expressed understanding and demonstrated technique.  

## 2022-08-01 NOTE — Progress Notes (Signed)
Subjective:   Patient ID: [name] Christensen, male    DOB: [birth year]  MRN: [id number]   Brief patient profile:  50 yowm never smoker s/p cardiac arrest Oct 31 2012 requiring artic sun and ever since Nov 10 2012  placement of debrillator every deep breath makes him cough> eval with ? pna 12/14/12  > rx levaquin some better  referred 12/16/2012 to pulmonary [facility] by Dr Posey Rea with dx of bronchiectasis on ct chest 02/04/18    History of Present Illness  12/16/2012 1st pulmonary eval cc intermittent chills, severe cough since Dec 17 when takes breath in started levquin 1/20with ? pna and starting to feel better, sob only with exertion, no excess mucus production. rec Take zantac (ranitidine) 150 mg after bfast and after supper whether you think you need it or not for now GERD  For cough use tramadol 50 mg one every 4 hours if needed     01/28/2013 f/u ov/[name] Christensen cc completely back to baseline, no cough or sob, not taking any cough meds for a week, still on the zantac at hs. rec Don't stop the zantac until 100% better for at least a week without the need for any cough medication.        03/25/2022  Re-establish ov/[name] Christensen re: bronchiectasis/ cough out of control on entresto since  08/15/20 and really bad cough since first of year 2023 no better with rx for CAP by Plotnikov  Chief Complaint  Patient presents with   Consult    Cough  Dyspnea:  still  working out doing  treadmill  x one mile x 24  min slt elevation  Cough: worst 1st thing in am and at hs  > min white phlegm Sleeping: flat bed and one pillow no noct  resp cc  SABA use: none 02: none  Covid status:   vax x 3  Rec For cough > mucinex dm  1200 mg every 12 hours as needed Stop entresto and replace valsartan 80 mg twice daily  Try prilosec otc 20mg   Take 30-60 min before first meal of the day and Pepcid ac (famotidine) 20 mg one @  bedtime until cough is completely gone for at least a week without the need for cough suppression GERD diet reviewed, bed blocks rec        05/23/2022  f/u ov/[name] Christensen re: bronchiectasis - no maint rx   Chief Complaint  Patient presents with   Follow-up    Pt states he is seeing some improvement with breathing and his cough.   Dyspnea:  back on treadmill ok same routine as before tol well  Cough: tolerable now / dry worse in am Sleeping: flat bed one pillow  SABA use: none  02: none  Covid status:   vax x 3  Rec Sputum culture > done 06/17/22 and 07/18/22  > too many sqamous cells   06/18/2022  f/u ov/[name] Christensen re: bronchiectasis   Chief Complaint  Patient presents with   Follow-up    Pt states he is doing better than he was at LOV. SOB with exertion.  Dyspnea:  still walking up to 5 x week x one mile in 27 min on a treadmill x slight elevation "up to 10%"  Cough: most productive first thing in am /mucoid/ gen cp ant with cough, no pleuritic or exertional qualities  Sleeping: flat bed one pillow SABA use: none Rec No change in medications  For cough > mucinex dm 1200 mg every 12 hours as needed  08/01/2022  f/u ov/[name] Christensen/ [name] Christensen re: bronchiectasis  maint on pepcid only /  no flutter  Chief Complaint  Patient presents with   Follow-up  Dyspnea:  doing better on treadmill / Not limited by breathing from desired activities   Cough: worse at hs / does not wake him up > milky not purulent, never bloody  Sleeping: bed is flat  SABA use: none  02: none   Covid status: vax x 3    No obvious day to day or daytime variability or assoc  purulent sputum or mucus plugs or hemoptysis or cp or chest tightness, subjective wheeze or overt sinus or hb symptoms.   sleeping without nocturnal  or early am exacerbation  of respiratory  c/o's or need for noct saba. Also denies any obvious fluctuation of symptoms with weather or environmental changes or other aggravating or alleviating factors except as outlined above   No unusual exposure hx or h/o childhood pna/ asthma or knowledge of premature birth.  Current Allergies, Complete Past Medical History, Past Surgical History, Family History, and Social History were reviewed in [employer] record.  ROS  The following are not active complaints unless bolded Hoarseness, sore throat, dysphagia, dental problems, itching, sneezing,  nasal congestion or discharge of excess mucus or purulent secretions, ear ache,   fever, chills, sweats, unintended wt loss or wt gain, classically pleuritic or exertional cp,  orthopnea pnd or arm/hand swelling  or leg swelling, presyncope, palpitations, abdominal pain, anorexia, nausea, vomiting, diarrhea  or change in bowel habits or change in bladder habits, change in stools or change in urine, dysuria, hematuria,  rash, arthralgias, visual complaints, headache, numbness, weakness or ataxia or problems with walking or coordination,  change in mood or  memory.        Current Meds  Medication Sig   acetaminophen (TYLENOL) 500 MG tablet Take 500 mg by mouth at bedtime.   alfuzosin (UROXATRAL) 10 MG 24 hr tablet Take 10 mg by mouth every evening.    atorvastatin (LIPITOR) 40 MG tablet TAKE ONE-HALF TABLET BY MOUTH AT BEDTIME FOR HIGH CHOLESTEROL   benzonatate (TESSALON) 100 MG capsule    betamethasone dipropionate 0.05 % cream Apply 1 application. topically 2 (two) times daily as needed.   cinacalcet (SENSIPAR) 30 MG tablet Take 1 tablet (30 mg total) by mouth 3  (three) times a week.   Dextromethorphan-guaiFENesin (MUCINEX DM PO) Take 30 mg by mouth in the morning and at bedtime.   dofetilide (TIKOSYN) 250 MCG capsule TAKE ONE CAPSULE BY MOUTH TWICE A DAY   donepezil (ARICEPT) 5 MG tablet Take 1 tablet (5 mg total) by mouth at bedtime.   dutasteride (AVODART) 0.5 MG capsule Take 0.5 mg by mouth every evening.    ELIQUIS 5 MG TABS tablet TAKE 1 TABLET BY MOUTH TWICE (2) DAILY   escitalopram (LEXAPRO) 10 MG tablet TAKE 1 TABLET BY MOUTH ONCE DAILY   famotidine (PEPCID) 20 MG tablet Take 20 mg by mouth daily.   HYDROcodone bit-homatropine (HYCODAN) 5-1.5 MG/5ML syrup Take 5 mLs by mouth every 6 (six) hours as needed for cough.   metoprolol succinate (TOPROL-XL) 50 MG 24 hr tablet TAKE 1 TABLET BY MOUTH TWICE A DAY. TAKEWITH OR IMMEDIATELY FOLLOWING A MEAL   Multiple Vitamin (MULTIVITAMIN WITH MINERALS) TABS tablet Take 1 tablet by mouth every other day.   nitroGLYCERIN (NITROSTAT) 0.4 MG SL tablet DISSOLVE 1 TABLET UNDER TONGUE AS NEEDEDFOR CHEST PAIN. MAY REPEAT 5 MINUTES APART 3  TIMES IF NEEDED   omeprazole (PRILOSEC) 20 MG capsule Take 20 mg by mouth daily.   Plecanatide 3 MG TABS TAKE ONE TABLET BY MOUTH ONCE EVERY DAY FOR CONSTIPATION *REPLACES LINACLOTIDE (LINZESS)*   Polyethyl Glycol-Propyl Glycol (SYSTANE OP) Place 1 drop into both eyes daily as needed (burning eyes).    triamcinolone cream (KENALOG) 0.1 % APPLY 1 APPLICATION TOPICALLY TWO TIMES DAILY AS NEEDED AVOIDING FACE, GROIN ANDARMPITS   valsartan (DIOVAN) 80 MG tablet Take 1 tablet (80 mg) by mouth once daily   vitamin B-12 (CYANOCOBALAMIN) 1000 MCG tablet Take 1,000 mcg by mouth daily.            Objective:   Physical Exam  Wts  08/01/2022      135  06/18/2022    134  05/23/2022    147  03/25/2022      136  04/10/2018   146   12/31/2012     146      12/16/12 145 lb 3.2 oz (65.862 kg)  12/14/12 145 lb (65.772 kg)  11/27/2012 144 lb (65.318 kg)      Vital signs reviewed  08/01/2022  -  Note at rest 02 sats  97% on RA   General appearance:    pleasant thin wm, dry sounding cough     HEENT : Oropharynx  clear  Nasal turbinates nl    NECK :  without  apparent JVD/ palpable Nodes/TM    LUNGS: no acc muscle use,  Min barrel  contour chest wall with bilateral  slightly decreased bs s audible wheeze and  without cough on insp or exp maneuvers and min  Hyperresonant  to  percussion bilaterally    CV:  RRR  no s3 or murmur or increase in P2, and no edema   ABD:  soft and nontender with pos end  insp Hoover's  in the supine position.  No bruits or organomegaly appreciated   MS:  Nl gait/ ext warm without deformities Or obvious joint restrictions  calf tenderness, cyanosis or clubbing     SKIN: warm and dry without lesions    NEURO:  alert, approp, nl sensorium with  no motor or cerebellar deficits apparent.          I personally reviewed images and agree with radiology impression as follows:   Chest CT 05/06/22  Widespread moderate cylindrical and varicoid bronchiectasis scattered throughout both lungs, with associated extensive patchy tree-in-bud opacities and scattered patchy regions of peripheral peribronchovascular consolidation. Findings have progressed since 02/04/2018 chest CT, particularly in the lower lobes. 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          Assessment & Plan:

## 2022-08-02 LAB — RESPIRATORY CULTURE OR RESPIRATORY AND SPUTUM CULTURE
MICRO NUMBER:: 13685319
SPECIMEN QUALITY:: ADEQUATE

## 2022-08-02 LAB — MYCOBACTERIA,CULT W/FLUOROCHROME SMEAR
MICRO NUMBER:: 13685318
SMEAR:: NONE SEEN
SPECIMEN QUALITY:: ADEQUATE

## 2022-08-06 DIAGNOSIS — M25511 Pain in right shoulder: Secondary | ICD-10-CM | POA: Diagnosis not present

## 2022-08-06 NOTE — Progress Notes (Signed)
Remote ICD transmission.   

## 2022-08-07 ENCOUNTER — Encounter (HOSPITAL_COMMUNITY): Payer: Self-pay | Admitting: Emergency Medicine

## 2022-08-07 ENCOUNTER — Other Ambulatory Visit: Payer: Self-pay

## 2022-08-07 ENCOUNTER — Emergency Department (HOSPITAL_COMMUNITY): Payer: PPO

## 2022-08-07 ENCOUNTER — Emergency Department (HOSPITAL_COMMUNITY)
Admission: EM | Admit: 2022-08-07 | Discharge: 2022-08-07 | Disposition: A | Discharge status: Home / Self Care | Payer: PPO | Attending: Emergency Medicine | Admitting: Emergency Medicine

## 2022-08-07 DIAGNOSIS — S51811A Laceration without foreign body of right forearm, initial encounter: Secondary | ICD-10-CM | POA: Insufficient documentation

## 2022-08-07 DIAGNOSIS — W01198A Fall on same level from slipping, tripping and stumbling with subsequent striking against other object, initial encounter: Secondary | ICD-10-CM | POA: Diagnosis not present

## 2022-08-07 DIAGNOSIS — S0990XA Unspecified injury of head, initial encounter: Secondary | ICD-10-CM | POA: Diagnosis present

## 2022-08-07 DIAGNOSIS — S01511A Laceration without foreign body of lip, initial encounter: Secondary | ICD-10-CM

## 2022-08-07 DIAGNOSIS — S0240EA Zygomatic fracture, right side, initial encounter for closed fracture: Secondary | ICD-10-CM | POA: Diagnosis not present

## 2022-08-07 DIAGNOSIS — S01111A Laceration without foreign body of right eyelid and periocular area, initial encounter: Secondary | ICD-10-CM | POA: Diagnosis not present

## 2022-08-07 DIAGNOSIS — Z79899 Other long term (current) drug therapy: Secondary | ICD-10-CM | POA: Insufficient documentation

## 2022-08-07 DIAGNOSIS — Y92524 Gas station as the place of occurrence of the external cause: Secondary | ICD-10-CM | POA: Insufficient documentation

## 2022-08-07 DIAGNOSIS — R9082 White matter disease, unspecified: Secondary | ICD-10-CM | POA: Diagnosis not present

## 2022-08-07 DIAGNOSIS — I2510 Atherosclerotic heart disease of native coronary artery without angina pectoris: Secondary | ICD-10-CM | POA: Insufficient documentation

## 2022-08-07 DIAGNOSIS — Z955 Presence of coronary angioplasty implant and graft: Secondary | ICD-10-CM | POA: Insufficient documentation

## 2022-08-07 DIAGNOSIS — S0231XA Fracture of orbital floor, right side, initial encounter for closed fracture: Secondary | ICD-10-CM

## 2022-08-07 DIAGNOSIS — W19XXXA Unspecified fall, initial encounter: Secondary | ICD-10-CM

## 2022-08-07 DIAGNOSIS — M47812 Spondylosis without myelopathy or radiculopathy, cervical region: Secondary | ICD-10-CM | POA: Diagnosis not present

## 2022-08-07 DIAGNOSIS — R58 Hemorrhage, not elsewhere classified: Secondary | ICD-10-CM | POA: Diagnosis not present

## 2022-08-07 DIAGNOSIS — S0240CA Maxillary fracture, right side, initial encounter for closed fracture: Secondary | ICD-10-CM | POA: Diagnosis not present

## 2022-08-07 DIAGNOSIS — Z23 Encounter for immunization: Secondary | ICD-10-CM | POA: Diagnosis not present

## 2022-08-07 DIAGNOSIS — Z7901 Long term (current) use of anticoagulants: Secondary | ICD-10-CM | POA: Insufficient documentation

## 2022-08-07 DIAGNOSIS — I10 Essential (primary) hypertension: Secondary | ICD-10-CM | POA: Insufficient documentation

## 2022-08-07 DIAGNOSIS — J479 Bronchiectasis, uncomplicated: Secondary | ICD-10-CM | POA: Diagnosis not present

## 2022-08-07 DIAGNOSIS — S3993XA Unspecified injury of pelvis, initial encounter: Secondary | ICD-10-CM | POA: Diagnosis not present

## 2022-08-07 DIAGNOSIS — I878 Other specified disorders of veins: Secondary | ICD-10-CM | POA: Diagnosis not present

## 2022-08-07 DIAGNOSIS — S199XXA Unspecified injury of neck, initial encounter: Secondary | ICD-10-CM | POA: Diagnosis not present

## 2022-08-07 DIAGNOSIS — M4312 Spondylolisthesis, cervical region: Secondary | ICD-10-CM | POA: Diagnosis not present

## 2022-08-07 LAB — BASIC METABOLIC PANEL
Anion gap: 8 (ref 5–15)
BUN: 24 mg/dL — ABNORMAL HIGH (ref 8–23)
CO2: 24 mmol/L (ref 22–32)
Calcium: 10.9 mg/dL — ABNORMAL HIGH (ref 8.9–10.3)
Chloride: 101 mmol/L (ref 98–111)
Creatinine, Ser: 1.13 mg/dL (ref 0.61–1.24)
GFR, Estimated: >60 mL/min (ref >60)
Glucose, Bld: 109 mg/dL — ABNORMAL HIGH (ref 70–99)
Potassium: 5.1 mmol/L (ref 3.5–5.1)
Sodium: 133 mmol/L — ABNORMAL LOW (ref 135–145)

## 2022-08-07 LAB — CBC WITH DIFFERENTIAL/PLATELET
Abs Immature Granulocytes: 0.05 10*3/uL (ref 0.00–0.07)
Basophils Absolute: 0 10*3/uL (ref 0.0–0.1)
Basophils Relative: 0 %
Eosinophils Absolute: 0.2 10*3/uL (ref 0.0–0.5)
Eosinophils Relative: 2 %
HCT: 35.4 % — ABNORMAL LOW (ref 39.0–52.0)
Hemoglobin: 11.6 g/dL — ABNORMAL LOW (ref 13.0–17.0)
Immature Granulocytes: 1 %
Lymphocytes Relative: 18 %
Lymphs Abs: 1.9 10*3/uL (ref 0.7–4.0)
MCH: 30.7 pg (ref 26.0–34.0)
MCHC: 32.8 g/dL (ref 30.0–36.0)
MCV: 93.7 fL (ref 80.0–100.0)
Monocytes Absolute: 0.6 10*3/uL (ref 0.1–1.0)
Monocytes Relative: 6 %
Neutro Abs: 7.5 10*3/uL (ref 1.7–7.7)
Neutrophils Relative %: 73 %
Platelets: 173 10*3/uL (ref 150–400)
RBC: 3.78 MIL/uL — ABNORMAL LOW (ref 4.22–5.81)
RDW: 13.6 % (ref 11.5–15.5)
WBC: 10.3 10*3/uL (ref 4.0–10.5)
nRBC: 0 % (ref 0.0–0.2)

## 2022-08-07 LAB — I-STAT CHEM 8, ED
BUN: 33 mg/dL — ABNORMAL HIGH (ref 8–23)
Calcium, Ion: 1.35 mmol/L (ref 1.15–1.40)
Chloride: 104 mmol/L (ref 98–111)
Creatinine, Ser: 1.1 mg/dL (ref 0.61–1.24)
Glucose, Bld: 110 mg/dL — ABNORMAL HIGH (ref 70–99)
HCT: 37 % — ABNORMAL LOW (ref 39.0–52.0)
Hemoglobin: 12.6 g/dL — ABNORMAL LOW (ref 13.0–17.0)
Potassium: 5 mmol/L (ref 3.5–5.1)
Sodium: 134 mmol/L — ABNORMAL LOW (ref 135–145)
TCO2: 26 mmol/L (ref 22–32)

## 2022-08-07 MED ORDER — TETANUS-DIPHTH-ACELL PERTUSSIS 5-2.5-18.5 LF-MCG/0.5 IM SUSY
0.5000 mL | PREFILLED_SYRINGE | Freq: Once | INTRAMUSCULAR | Status: AC
  Administered 2022-08-07: 0.5 mL via INTRAMUSCULAR
  Filled 2022-08-07: qty 0.5

## 2022-08-07 MED ORDER — CLINDAMYCIN HCL 300 MG PO CAPS: 300.0000 mg | ORAL_CAPSULE | Freq: Three times a day (TID) | ORAL | 0 refills | Status: DC

## 2022-08-07 MED ORDER — ACETAMINOPHEN 500 MG PO TABS
1000.0000 mg | ORAL_TABLET | Freq: Once | ORAL | Status: AC
  Administered 2022-08-07: 1000 mg via ORAL
  Filled 2022-08-07: qty 2

## 2022-08-07 NOTE — ED Provider Notes (Signed)
Patient initially evaluated by Dr. Kingsley Callander and signed out to me at 1600 pending CT reads.  Patient's x-ray showed no acute traumatic injury.  CT head and C-spine are negative for acute traumatic injury.  CT maxillofacial shows a orbital floor fracture as well as a zygomatic fracture that is nondisplaced on the right.  Upon my evaluation, the patient is awake and alert and well-appearing.  He has full extraocular range of motion without any evidence of muscle entrapment.  He does have bruising to his right inferior eye.  Patient is stable for discharge home.  He states he has an ophthalmologist to follow-up with and will be given ENT to follow-up with as well.  He was recommended to follow-up with his primary doctor for wounds rechecked.  He had a right eyebrow laceration repaired with glue and a small upper lip laceration that did not require repair.  Patient and family were given strict return precautions.   Ottie Glazier, DO 08/07/22 1715

## 2022-08-07 NOTE — ED Notes (Signed)
Trauma Response Nurse Documentation   [name] Christensen is a 88 y.o. male arriving to [facility] ED via EMS  On Eliquis (apixaban) daily. Trauma was activated as a Level 2 by charge RN based on the following trauma criteria Elderly patients > 65 with head trauma on anti-coagulation (excluding ASA). Trauma team at the bedside on patient arrival.   Patient cleared for CT by Dr. Manus Gunning. Pt transported to CT with trauma response nurse present to monitor. RN remained with the patient throughout their absence from the department for clinical observation.   GCS 15.  History   Past Medical History:  Diagnosis Date   Actinic keratosis 06/09/2019   L sup forehead at hairline - bx proven   AICD (automatic cardioverter/defibrillator) present 04/07/2017   Allergy    Anxiety    BPH (benign prostatic hypertrophy)    elevated PSA Dr. Lindell Noe Bx 2010   Cardiac arrest [facility])    a. out-of-hospital arrest 11/13/2012 - EF 40-45%, patent grafts on cath, received [employer] AICD.   Carotid disease, bilateral (HCC)    a. 0-39% by doppers.   Cataract    bil cataracts removed   CHF (congestive heart failure) (HCC)    Coronary artery disease    a. s/p MI/CABG 2005. b. s/p cath at time of VF arrest 10/2013 - grafts patent.   Cough    Diverticulosis    Elevated LFTs    a. 10/2012 felt due to cardiac arrest - Hepatitis C Ab reactive from 29-Nov-2012>>Hep C RNA PCR negative 10/30/2012.    GERD (gastroesophageal reflux disease)    Hyperlipidemia    Hypertension    Internal hemorrhoids    Myocardial infarction (HCC)    2005 - CABG x 5 2005   RBBB    Tubular adenoma of colon    Ventricular bigeminy    a. Event monitor 01/2013: NSR with PVCs and occ bigeminy.     Past Surgical History:  Procedure Laterality Date   APPENDECTOMY     CARDIAC CATHETERIZATION  2007   with patent graft anatomy atretic left internal mammary  artery to the LAD which is nonobstructive. Will restart study June 08, 2007   CARDIOVERSION  N/A 03/27/2018   Procedure: CARDIOVERSION;  Surgeon: Iran Ouch, MD;  Location: [facility] ORS;  Service: Cardiovascular;  Laterality: N/A;   CARDIOVERSION N/A 01/04/2019   Procedure: CARDIOVERSION (CATH LAB);  Surgeon: Iran Ouch, MD;  Location: [facility] ORS;  Service: Cardiovascular;  Laterality: N/A;   CARDIOVERSION N/A 07/07/2019   Procedure: CARDIOVERSION;  Surgeon: Wendall Stade, MD;  Location: [facility] ENDOSCOPY;  Service: Cardiovascular;  Laterality: N/A;   CATARACT EXTRACTION W/PHACO Right 04/23/2016   Procedure: CATARACT EXTRACTION PHACO AND INTRAOCULAR LENS PLACEMENT (IOC);  Surgeon: [name], MD;  Location: [facility] ORS;  Service: Ophthalmology;  Laterality: Right;  [place] 1.09 AP% 19.3 CDE 13.38 Fluid pack lot # [id number]   CHOLECYSTECTOMY N/A 06/05/2017   Procedure: LAPAROSCOPIC CHOLECYSTECTOMY WITH INTRAOPERATIVE CHOLANGIOGRAM ERAS PATHWAY POSSIBLE NEEDLE CORE BIOPSY OF LIVER;  Surgeon: [name], MD;  Location: [facility] OR;  Service: General;  Laterality: N/A;  ERAS PATHWAY   COLONOSCOPY     CORONARY ARTERY BYPASS GRAFT  2005   EYE SURGERY     ICD GENERATOR CHANGEOUT N/A 04/07/2017   Procedure: ICD Generator Changeout;  Surgeon: Duke Salvia, MD;  Location: [facility] INVASIVE CV LAB;  Service: Cardiovascular;  Laterality: N/A;   IMPLANTABLE CARDIOVERTER DEFIBRILLATOR IMPLANT N/A 10/26/2012   STJ single chamber ICD  implanted by Dr Graciela Husbands for cardiac arrest    INGUINAL HERNIA REPAIR Bilateral 01/17/2015   Procedure: BILATERAL LAPAROSCOPIC INGUINAL HERNIA REPAIR WITH LEFT FEMORAL HERNIA REPAIR;  Surgeon: [name], MD;  Location: [facility] OR;  Service: General;  Laterality: Bilateral;   INSERTION OF MESH Bilateral 01/17/2015   Procedure: INSERTION OF MESH;  Surgeon: [name], MD;  Location: [facility] OR;  Service: General;  Laterality: Bilateral;   LAPAROSCOPIC CHOLECYSTECTOMY  06/05/2017   LEAD INSERTION N/A 04/07/2017   Procedure: RA Lead Insertion;  Surgeon: Duke Salvia, MD;  Location: [facility]  INVASIVE CV LAB;  Service: Cardiovascular;  Laterality: N/A;   LEAD REVISION/REPAIR N/A 04/08/2017   Procedure: Atrial Lead Revision/Repair;  Surgeon: Duke Salvia, MD;  Location: [facility] INVASIVE CV LAB;  Service: Cardiovascular;  Laterality: N/A;   LEFT HEART CATH AND CORONARY ANGIOGRAPHY Left 05/14/2019   Procedure: LEFT HEART CATH AND CORONARY ANGIOGRAPHY;  Surgeon: Iran Ouch, MD;  Location: [facility] INVASIVE CV LAB;  Service: Cardiovascular;  Laterality: Left;   LEFT HEART CATHETERIZATION WITH CORONARY/GRAFT ANGIOGRAM N/A 11/17/2012   Procedure: LEFT HEART CATHETERIZATION WITH Isabel Caprice;  Surgeon: [name], MD;  Location: [facility] CATH LAB;  Service: Cardiovascular;  Laterality: N/A;   LIVER BIOPSY N/A 06/05/2017   Procedure: SINGLE SITE LIVER BIOPSY ERAS PATHWAY;  Surgeon: [name], MD;  Location: [facility] OR;  Service: General;  Laterality: N/A;  ERAS PATHWAY   LUMBAR FUSION  09/2007       Initial Focused Assessment (If applicable, or please see trauma documentation):   CT's Completed:   CT Head, CT Maxillofacial, and CT C-Spine   Patient tripped at gas station, fell and hit his head. He has an abrasion to the upper lip, chin, blood in nares, laceration to the left eyebrow, skin tear to the right forearm. He is A&Ox4, GCS of 15. C-collar placed upon arrival.  Bedside handoff with ED RN [place].    Please call TRN at [phone] for further assistance.

## 2022-08-07 NOTE — ED Notes (Signed)
[place] c- collar applied .  

## 2022-08-07 NOTE — ED Triage Notes (Signed)
Pt arrived via [facility] for level 2 fall on thinners (Eliquis). Pt suffered mechanical fall while tripping over curb at gas station. Abrasion to upper lip, chin, blood in nares, approx 1in laceration to left eyebrow, skin tear to right lateral and dorsal forearm - bleeding controlled in all sites. Pt not in ccollar at time of arrival, denies LOC, neck pain, back pain. A&Ox4, GCS 15. Denies shortness of breath, chest pain, dizziness.   PTA Vitals  BP 190/107 HR 96 NS SPO2 97% RA RR 18 CBG 117

## 2022-08-07 NOTE — ED Notes (Signed)
Dr. Wyvonnia Dusky at bedside for laceration repair

## 2022-08-07 NOTE — ED Notes (Signed)
Family updated as to patient's status, son wife at bedside

## 2022-08-07 NOTE — ED Notes (Signed)
Pt transported to CT with [facility] TRN

## 2022-08-07 NOTE — Discharge Instructions (Addendum)
Keep wounds clean and dry.  You should follow a soft diet for the next week and rinse her mouth out well after eating because there is a laceration inside your upper lip.  Take the antibiotics as prescribed. You should avoid nose blowing and sniffling. Sleep with the head of your bed elevated You can apply ice to your face over the next 48 hours to reduce swelling. You can take Tylenol as needed for pain You should follow up with your ophthalmologist within the next week. Follow-up with your doctor for recheck later this week.  Return to the ED with vision changes or double vision, worsening headaches, repetitive vomiting, or any new or worsening symptoms.

## 2022-08-07 NOTE — ED Notes (Signed)
Pt returned from CT. Suture cart and dermabond at bedside.

## 2022-08-07 NOTE — ED Provider Notes (Signed)
[facility] EMERGENCY DEPARTMENT Provider Note   CSN: [id number] Arrival date & time: 08/07/22  1520     History  No chief complaint on file.   [name] Christensen is a 88 y.o. male.  Patient with history of previous cardiac arrest status post defibrillator, hypertension, hyperlipidemia, CAD status post CABG presenting with fall and head injury.  States he was at the gas station and tried to walk over the gas pump hose and ended up tripping and falling hitting his face and right arm.  Did not lose consciousness.  Remembers the whole fall.  Complains of pain in his right face and his right arm.  No nausea or vomiting.  No chest pain or shortness of breath.  Denies any preceding dizziness, weakness, numbness or tingling. Does take Eliquis for unknown reason. Denies any syncopal episode.  Recalls the entire fall.  The history is provided by the patient and the EMS personnel.       Home Medications Prior to Admission medications   Medication Sig Start Date End Date Taking? Authorizing Provider  acetaminophen (TYLENOL) 500 MG tablet Take 500 mg by mouth at bedtime.    [provider]  alfuzosin (UROXATRAL) 10 MG 24 hr tablet Take 10 mg by mouth every evening.     [provider]  atorvastatin (LIPITOR) 40 MG tablet TAKE ONE-HALF TABLET BY MOUTH AT BEDTIME FOR HIGH CHOLESTEROL 03/20/22   [provider]  benzonatate (TESSALON) 100 MG capsule     [provider]  betamethasone dipropionate 0.05 % cream Apply 1 application. topically 2 (two) times daily as needed.    [provider]  cinacalcet (SENSIPAR) 30 MG tablet Take 1 tablet (30 mg total) by mouth 3 (three) times a week. 04/16/21   Renato Shin, MD  Dextromethorphan-guaiFENesin [facility] DM PO) Take 30 mg by mouth in the morning and at bedtime.    [provider]  dofetilide (TIKOSYN) 250 MCG capsule TAKE ONE CAPSULE BY MOUTH TWICE A DAY 05/30/22   Deboraha Sprang, MD   donepezil (ARICEPT) 5 MG tablet Take 1 tablet (5 mg total) by mouth at bedtime. 07/03/22   Plotnikov, Evie Lacks, MD  dutasteride (AVODART) 0.5 MG capsule Take 0.5 mg by mouth every evening.     [provider]  ELIQUIS 5 MG TABS tablet TAKE 1 TABLET BY MOUTH TWICE (2) DAILY 09/10/21   Plotnikov, Evie Lacks, MD  escitalopram (LEXAPRO) 10 MG tablet TAKE 1 TABLET BY MOUTH ONCE DAILY 06/17/22   Plotnikov, Evie Lacks, MD  famotidine (PEPCID) 20 MG tablet Take 20 mg by mouth daily.    [provider]  HYDROcodone bit-homatropine (HYCODAN) 5-1.5 MG/5ML syrup Take 5 mLs by mouth every 6 (six) hours as needed for cough. 07/03/22   Plotnikov, Evie Lacks, MD  metoprolol succinate (TOPROL-XL) 50 MG 24 hr tablet TAKE 1 TABLET BY MOUTH TWICE A DAY. TAKEWITH OR IMMEDIATELY FOLLOWING A MEAL 03/01/21   Plotnikov, Evie Lacks, MD  Multiple Vitamin (MULTIVITAMIN WITH MINERALS) TABS tablet Take 1 tablet by mouth every other day.    [provider]  nitroGLYCERIN (NITROSTAT) 0.4 MG SL tablet DISSOLVE 1 TABLET UNDER TONGUE AS NEEDEDFOR CHEST PAIN. MAY REPEAT 5 MINUTES APART 3 TIMES IF NEEDED 05/30/21   Plotnikov, Evie Lacks, MD  omeprazole (PRILOSEC) 20 MG capsule Take 20 mg by mouth daily.    [provider]  Plecanatide 3 MG TABS TAKE ONE TABLET BY MOUTH ONCE EVERY DAY FOR CONSTIPATION *REPLACES  LINACLOTIDE Rolan Lipa)* 03/25/22   [provider]  Polyethyl Glycol-Propyl Glycol (SYSTANE OP) Place 1 drop into both eyes daily as needed (burning eyes).     [provider]  triamcinolone cream (KENALOG) 0.1 % APPLY 1 APPLICATION TOPICALLY TWO TIMES DAILY AS NEEDED AVOIDING FACE, GROIN ANDARMPITS 03/13/22   Ralene Bathe, MD  valsartan (DIOVAN) 80 MG tablet Take 1 tablet (80 mg) by mouth once daily 04/24/22   Deboraha Sprang, MD  vitamin B-12 (CYANOCOBALAMIN) 1000 MCG tablet Take 1,000 mcg by mouth daily.    [provider]      Allergies    Ezetimibe, Penicillins,  Pravastatin sodium, Rosuvastatin, and Niacin    Review of Systems   Review of Systems  Constitutional:  Negative for fever.  Respiratory:  Negative for cough, chest tightness and shortness of breath.    all other systems are negative except as noted in the HPI and PMH.    Physical Exam Updated Vital Signs BP (!) 160/100 Comment: Manual  Pulse (!) 101   Temp 98.9 F (37.2 C)   Resp (!) 21   Ht '5\' 6"'$  (1.676 m)   Wt 59.4 kg   SpO2 95%   BMI 21.14 kg/m  Physical Exam Vitals and nursing note reviewed.  Constitutional:      General: He is not in acute distress.    Appearance: He is well-developed.  HENT:     Head: Normocephalic.     Comments: Superficial laceration right eyebrow approximately 2 cm.  Hemostatic. R periorbital ecchymosis. EOMI.  Abrasion upper lip.  Laceration to mucosal surface upper lip approximately 1 cm.   Laceration is not through and through.  No trismus or malocclusion.    Mouth/Throat:     Pharynx: No oropharyngeal exudate.  Eyes:     Conjunctiva/sclera: Conjunctivae normal.     Pupils: Pupils are equal, round, and reactive to light.  Neck:     Comments: No C-spine tenderness, c-collar placed on arrival. Cardiovascular:     Rate and Rhythm: Normal rate and regular rhythm.     Heart sounds: Normal heart sounds. No murmur heard. Pulmonary:     Effort: Pulmonary effort is normal. No respiratory distress.     Breath sounds: Normal breath sounds.  Chest:     Chest wall: No tenderness.  Abdominal:     Palpations: Abdomen is soft.     Tenderness: There is no abdominal tenderness. There is no guarding or rebound.  Musculoskeletal:        General: No tenderness. Normal range of motion.     Cervical back: Normal range of motion and neck supple.     Comments: Skin tear right forearm x2, no bony tenderness.  Full range of motion of elbow and wrist bilaterally.  Skin:    General: Skin is warm.  Neurological:     Mental Status: He is alert and oriented  to person, place, and time.     Cranial Nerves: No cranial nerve deficit.     Motor: No abnormal muscle tone.     Coordination: Coordination normal.     Comments:  5/5 strength throughout. CN 2-12 intact.Equal grip strength.   Psychiatric:        Behavior: Behavior normal.     ED Results / Procedures / Treatments   Labs (all labs ordered are listed, but only abnormal results are displayed) Labs Reviewed  CBC WITH DIFFERENTIAL/PLATELET - Abnormal; Notable for the following components:      Result  Value   RBC 3.78 (*)    Hemoglobin 11.6 (*)    HCT 35.4 (*)    All other components within normal limits  BASIC METABOLIC PANEL - Abnormal; Notable for the following components:   Sodium 133 (*)    Glucose, Bld 109 (*)    BUN 24 (*)    Calcium 10.9 (*)    All other components within normal limits  I-STAT CHEM 8, ED - Abnormal; Notable for the following components:   Sodium 134 (*)    BUN 33 (*)    Glucose, Bld 110 (*)    Hemoglobin 12.6 (*)    HCT 37.0 (*)    All other components within normal limits    EKG None  Radiology CT Head Wo Contrast  Result Date: 08/07/2022 CLINICAL DATA:  Fall. EXAM: CT HEAD WITHOUT CONTRAST CT MAXILLOFACIAL WITHOUT CONTRAST CT CERVICAL SPINE WITHOUT CONTRAST TECHNIQUE: Multidetector CT imaging of the head, cervical spine, and maxillofacial structures were performed using the standard protocol without intravenous contrast. Multiplanar CT image reconstructions of the cervical spine and maxillofacial structures were also generated. RADIATION DOSE REDUCTION: This exam was performed according to the departmental dose-optimization program which includes automated exposure control, adjustment of the mA and/or kV according to patient size and/or use of iterative reconstruction technique. COMPARISON:  October 31, 2012. FINDINGS: CT HEAD FINDINGS Brain: Mild chronic ischemic white matter disease is noted. No mass effect or midline shift is noted. Ventricular size is  within normal limits. There is no evidence of mass lesion, hemorrhage or acute infarction. Vascular: No hyperdense vessel or unexpected calcification. Skull: Normal. Negative for fracture or focal lesion. Other: None. CT MAXILLOFACIAL FINDINGS Osseous: The mandible is unremarkable. Mildly displaced fracture involving the anterior portion of the right orbital floor is noted with gas present in the inferior portion of the right orbit. There also appears to be moderately displaced and comminuted fractures involving the anterior posterior walls of the right maxillary sinus. Opacification of right maxillary sinus is noted consistent with hemorrhage. Also noted is nondisplaced fracture involving the right zygomatic arch. Orbits: Left orbit is unremarkable. As noted above, there is gas seen in inferior portion of right orbit anteriorly consistent with orbital blowout fracture. Sinuses: As noted above, opacification of right maxillary sinus is noted consistent with hemorrhage. Soft tissues: Negative. CT CERVICAL SPINE FINDINGS Alignment: Minimal grade 1 anterolisthesis of C5-6 is noted secondary to posterior facet joint hypertrophy. Skull base and vertebrae: No acute fracture. No primary bone lesion or focal pathologic process. Soft tissues and spinal canal: No prevertebral fluid or swelling. No visible canal hematoma. Disc levels:  Mild degenerative changes are noted at C3-4 and C4-5. Upper chest: Negative. Other: None. IMPRESSION: No acute intracranial abnormality seen. Mildly displaced right anterior orbital floor fracture is noted with hemorrhage in the right maxillary sinus as well as gas in the right orbit. Moderately displaced fractures are also seen involving the anterior and posterior aspects of the right maxillary sinus. Nondisplaced right zygomatic arch fracture is noted. Multilevel degenerative changes are noted cervical spine. No acute abnormality is noted in the cervical spine. Electronically Signed   By:  Marijo Conception M.D.   On: 08/07/2022 16:18   CT Cervical Spine Wo Contrast  Result Date: 08/07/2022 CLINICAL DATA:  Fall. EXAM: CT HEAD WITHOUT CONTRAST CT MAXILLOFACIAL WITHOUT CONTRAST CT CERVICAL SPINE WITHOUT CONTRAST TECHNIQUE: Multidetector CT imaging of the head, cervical spine, and maxillofacial structures were performed using the standard protocol without intravenous contrast.  Multiplanar CT image reconstructions of the cervical spine and maxillofacial structures were also generated. RADIATION DOSE REDUCTION: This exam was performed according to the departmental dose-optimization program which includes automated exposure control, adjustment of the mA and/or kV according to patient size and/or use of iterative reconstruction technique. COMPARISON:  October 31, 2012. FINDINGS: CT HEAD FINDINGS Brain: Mild chronic ischemic white matter disease is noted. No mass effect or midline shift is noted. Ventricular size is within normal limits. There is no evidence of mass lesion, hemorrhage or acute infarction. Vascular: No hyperdense vessel or unexpected calcification. Skull: Normal. Negative for fracture or focal lesion. Other: None. CT MAXILLOFACIAL FINDINGS Osseous: The mandible is unremarkable. Mildly displaced fracture involving the anterior portion of the right orbital floor is noted with gas present in the inferior portion of the right orbit. There also appears to be moderately displaced and comminuted fractures involving the anterior posterior walls of the right maxillary sinus. Opacification of right maxillary sinus is noted consistent with hemorrhage. Also noted is nondisplaced fracture involving the right zygomatic arch. Orbits: Left orbit is unremarkable. As noted above, there is gas seen in inferior portion of right orbit anteriorly consistent with orbital blowout fracture. Sinuses: As noted above, opacification of right maxillary sinus is noted consistent with hemorrhage. Soft tissues: Negative. CT  CERVICAL SPINE FINDINGS Alignment: Minimal grade 1 anterolisthesis of C5-6 is noted secondary to posterior facet joint hypertrophy. Skull base and vertebrae: No acute fracture. No primary bone lesion or focal pathologic process. Soft tissues and spinal canal: No prevertebral fluid or swelling. No visible canal hematoma. Disc levels:  Mild degenerative changes are noted at C3-4 and C4-5. Upper chest: Negative. Other: None. IMPRESSION: No acute intracranial abnormality seen. Mildly displaced right anterior orbital floor fracture is noted with hemorrhage in the right maxillary sinus as well as gas in the right orbit. Moderately displaced fractures are also seen involving the anterior and posterior aspects of the right maxillary sinus. Nondisplaced right zygomatic arch fracture is noted. Multilevel degenerative changes are noted cervical spine. No acute abnormality is noted in the cervical spine. Electronically Signed   By: Marijo Conception M.D.   On: 08/07/2022 16:18   CT Maxillofacial Wo Contrast  Result Date: 08/07/2022 CLINICAL DATA:  Fall. EXAM: CT HEAD WITHOUT CONTRAST CT MAXILLOFACIAL WITHOUT CONTRAST CT CERVICAL SPINE WITHOUT CONTRAST TECHNIQUE: Multidetector CT imaging of the head, cervical spine, and maxillofacial structures were performed using the standard protocol without intravenous contrast. Multiplanar CT image reconstructions of the cervical spine and maxillofacial structures were also generated. RADIATION DOSE REDUCTION: This exam was performed according to the departmental dose-optimization program which includes automated exposure control, adjustment of the mA and/or kV according to patient size and/or use of iterative reconstruction technique. COMPARISON:  October 31, 2012. FINDINGS: CT HEAD FINDINGS Brain: Mild chronic ischemic white matter disease is noted. No mass effect or midline shift is noted. Ventricular size is within normal limits. There is no evidence of mass lesion, hemorrhage or  acute infarction. Vascular: No hyperdense vessel or unexpected calcification. Skull: Normal. Negative for fracture or focal lesion. Other: None. CT MAXILLOFACIAL FINDINGS Osseous: The mandible is unremarkable. Mildly displaced fracture involving the anterior portion of the right orbital floor is noted with gas present in the inferior portion of the right orbit. There also appears to be moderately displaced and comminuted fractures involving the anterior posterior walls of the right maxillary sinus. Opacification of right maxillary sinus is noted consistent with hemorrhage. Also noted is nondisplaced fracture involving  the right zygomatic arch. Orbits: Left orbit is unremarkable. As noted above, there is gas seen in inferior portion of right orbit anteriorly consistent with orbital blowout fracture. Sinuses: As noted above, opacification of right maxillary sinus is noted consistent with hemorrhage. Soft tissues: Negative. CT CERVICAL SPINE FINDINGS Alignment: Minimal grade 1 anterolisthesis of C5-6 is noted secondary to posterior facet joint hypertrophy. Skull base and vertebrae: No acute fracture. No primary bone lesion or focal pathologic process. Soft tissues and spinal canal: No prevertebral fluid or swelling. No visible canal hematoma. Disc levels:  Mild degenerative changes are noted at C3-4 and C4-5. Upper chest: Negative. Other: None. IMPRESSION: No acute intracranial abnormality seen. Mildly displaced right anterior orbital floor fracture is noted with hemorrhage in the right maxillary sinus as well as gas in the right orbit. Moderately displaced fractures are also seen involving the anterior and posterior aspects of the right maxillary sinus. Nondisplaced right zygomatic arch fracture is noted. Multilevel degenerative changes are noted cervical spine. No acute abnormality is noted in the cervical spine. Electronically Signed   By: Marijo Conception M.D.   On: 08/07/2022 16:18   DG Forearm Right  Result  Date: 08/07/2022 CLINICAL DATA:  Fall.  Lateral and distal forearm skin tear.  Pain. EXAM: RIGHT FOREARM - 2 VIEW COMPARISON:  None Available. FINDINGS: No acute fracture is seen within the radius or ulna. Mild medial aspect of the elbow joint space narrowing and peripheral degenerative osteophytosis. Mild-to-moderate thumb carpometacarpal joint space narrowing and mild peripheral degenerative osteophytes. IMPRESSION: 1. No acute fracture. 2. Mild medial elbow osteoarthritis. Electronically Signed   By: Yvonne Kendall M.D.   On: 08/07/2022 16:07   DG Pelvis Portable  Result Date: 08/07/2022 CLINICAL DATA:  Mechanical fall.  Tripped over curb at gas station. EXAM: PORTABLE PELVIS 1-2 VIEWS COMPARISON:  CT abdomen and pelvis 12/13/2014 FINDINGS: There is diffuse decreased bone mineralization. Mild bilateral sacroiliac subchondral sclerosis. The bilateral femoroacetabular and pubic symphysis joint spaces are maintained. A surgical clip overlies the left hemipelvis. Vascular phleboliths overlie the left-greater-than-right pelvis. No acute fracture is seen. No dislocation. IMPRESSION: No acute fracture is seen. Electronically Signed   By: Yvonne Kendall M.D.   On: 08/07/2022 16:05   DG Chest Portable 1 View  Result Date: 08/07/2022 CLINICAL DATA:  Level 2 fall.  Tripped over curb at gas station. EXAM: PORTABLE CHEST 1 VIEW COMPARISON:  Chest radiographs 08/01/2022 and 01/30/2022; CT chest 05/06/2022 FINDINGS: Status post median sternotomy. Cardiac silhouette is again mildly enlarged. Moderate calcification within the aortic arch. Moderate bilateral mid and lower lung interstitial thickening is similar to multiple prior radiographs and likely represents chronic scarring/interstitial lung disease. Mild biapical pleural thickening is unchanged. No pleural effusion or pneumothorax. Left chest wall cardiac AICD with leads again overlying the right atrium and right ventricle. Moderate multilevel degenerative disc changes  of the thoracic spine. IMPRESSION: 1. There is again moderate bilateral mid and lower lung interstitial thickening. Prior CT demonstrated bronchiectasis with airspace opacities suggesting chronic infection due to atypical mycobacterial infection. The radiographic appearance is similar to 08/01/2022 and 01/30/2022. 2. No pleural effusion. Electronically Signed   By: Yvonne Kendall M.D.   On: 08/07/2022 16:03    Procedures .Marland KitchenLaceration Repair  Date/Time: 08/07/2022 5:32 PM  Performed by: Ezequiel Essex, MD Authorized by: Ezequiel Essex, MD   Consent:    Consent obtained:  Verbal   Consent given by:  Patient   Risks, benefits, and alternatives were discussed: yes  Risks discussed:  Need for additional repair, tendon damage, retained foreign body, pain, poor cosmetic result, infection, poor wound healing, nerve damage and vascular damage   Alternatives discussed:  No treatment Universal protocol:    Procedure explained and questions answered to patient or proxy's satisfaction: yes     Relevant documents present and verified: yes     Test results available: yes     Imaging studies available: yes     Required blood products, implants, devices, and special equipment available: yes     Site/side marked: yes     Patient identity confirmed:  Verbally with patient Anesthesia:    Anesthesia method:  None Laceration details:    Location:  Face   Face location:  R eyebrow   Length (cm):  2 Pre-procedure details:    Preparation:  Patient was prepped and draped in usual sterile fashion and imaging obtained to evaluate for foreign bodies Exploration:    Hemostasis achieved with:  Direct pressure   Imaging outcome: foreign body not noted     Wound extent: no vascular damage noted   Treatment:    Area cleansed with:  Saline   Amount of cleaning:  Standard   Irrigation solution:  Sterile saline   Irrigation method:  Pressure wash Skin repair:    Repair method:  Tissue  adhesive Approximation:    Approximation:  Close Repair type:    Repair type:  Simple Post-procedure details:    Dressing:  Open (no dressing)   Procedure completion:  Tolerated     Medications Ordered in ED Medications  Tdap (BOOSTRIX) injection 0.5 mL (has no administration in time range)    ED Course/ Medical Decision Making/ A&P Clinical Course as of 08/07/22 1731  Wed Aug 07, 2022  1720 Patient's c-collar was cleared by myself. [VK]    Clinical Course User Index [VK] Ottie Glazier, DO                           Medical Decision Making Amount and/or Complexity of Data Reviewed Labs: ordered. Decision-making details documented in ED Course. Radiology: ordered and independent interpretation performed. Decision-making details documented in ED Course. ECG/medicine tests: ordered and independent interpretation performed. Decision-making details documented in ED Course.  Risk OTC drugs. Prescription drug management.  Apparent mechanical fall with head injury and facial lacerations.  No loss of consciousness.  Is anticoagulated on Eliquis.  Vital stable.  Neurovascularly intact.  Patient seen as a level 2 trauma on arrival.  Will obtain imaging of his head, face and neck.  Wounds to be clean.  Tetanus to be updated.  CT head, face, C spine to be obtained. CXR, PXR, R forearm xrays.   Eyebrow laceration repair with glue as above. Mucosal lip laceration is not through and through. Suture offered. Patient prefers not to have sutures which seems acceptable.   Advised soft diet, antitbiotics.   CTs and Xrays pending at shift change. Dr. Ernesto Rutherford to assume care.           Final Clinical Impression(s) / ED Diagnoses Final diagnoses:  Fall, initial encounter  Lip laceration, initial encounter  Closed fracture of right orbital floor, initial encounter [facility])  Closed fracture of right zygomatic arch, initial encounter [facility])    Rx / [place] Orders ED Discharge Orders      None         Josephine Wooldridge, Annie Main, MD 08/07/22 1737

## 2022-08-07 NOTE — Progress Notes (Signed)
Orthopedic Tech Progress Note Patient Details:  [name] Christensen 1932-10-22 [id number]  Level 2 trauma   Patient ID: [name] Christensen, male   DOB: [birth year], 88 y.o.   MRN: [id number]  [name] 08/07/2022, 3:31 PM

## 2022-08-08 ENCOUNTER — Encounter: Payer: PPO | Admitting: Dermatology

## 2022-08-08 DIAGNOSIS — S0285XA Fracture of orbit, unspecified, initial encounter for closed fracture: Secondary | ICD-10-CM | POA: Diagnosis not present

## 2022-08-16 ENCOUNTER — Telehealth: Payer: Self-pay | Admitting: Internal Medicine

## 2022-08-16 NOTE — Telephone Encounter (Signed)
I've tried multiple times to send this by email but it won't go so I'm trying this way:   Hey ladies!  This order has been outstanding since before I left for vacation.  Hoping one of you can help!  We are still needing the narrative paragraph that is on the referral document copied and pasted to the office visit note.  Insurance requires that this documentation be in an MD progress note and not on the order.  Can you ask Dr. Melvyn Novas to do this please?  Or can one of you do and then ask him to cosign?  Not sure how that works on your end. This is all that we're missing!

## 2022-08-19 ENCOUNTER — Ambulatory Visit (INDEPENDENT_AMBULATORY_CARE_PROVIDER_SITE_OTHER): Payer: PPO | Admitting: Internal Medicine

## 2022-08-19 ENCOUNTER — Encounter: Payer: Self-pay | Admitting: Internal Medicine

## 2022-08-19 VITALS — BP 118/70 | HR 89 | Temp 98.0°F | Ht 66.0 in | Wt 133.8 lb

## 2022-08-19 DIAGNOSIS — S0240ED Zygomatic fracture, right side, subsequent encounter for fracture with routine healing: Secondary | ICD-10-CM

## 2022-08-19 DIAGNOSIS — S060XAA Concussion with loss of consciousness status unknown, initial encounter: Secondary | ICD-10-CM | POA: Insufficient documentation

## 2022-08-19 DIAGNOSIS — R5382 Chronic fatigue, unspecified: Secondary | ICD-10-CM | POA: Diagnosis not present

## 2022-08-19 DIAGNOSIS — E538 Deficiency of other specified B group vitamins: Secondary | ICD-10-CM

## 2022-08-19 DIAGNOSIS — R413 Other amnesia: Secondary | ICD-10-CM | POA: Diagnosis not present

## 2022-08-19 DIAGNOSIS — K1379 Other lesions of oral mucosa: Secondary | ICD-10-CM | POA: Insufficient documentation

## 2022-08-19 DIAGNOSIS — S060X0D Concussion without loss of consciousness, subsequent encounter: Secondary | ICD-10-CM | POA: Diagnosis not present

## 2022-08-19 DIAGNOSIS — S02402A Zygomatic fracture, unspecified, initial encounter for closed fracture: Secondary | ICD-10-CM | POA: Insufficient documentation

## 2022-08-19 DIAGNOSIS — Z23 Encounter for immunization: Secondary | ICD-10-CM | POA: Diagnosis not present

## 2022-08-19 MED ORDER — ESCITALOPRAM OXALATE 10 MG PO TABS: 10.0000 mg | ORAL_TABLET | Freq: Every day | ORAL | 1 refills | Status: AC

## 2022-08-19 MED ORDER — METOPROLOL SUCCINATE ER 50 MG PO TB24: ORAL_TABLET | ORAL | 3 refills | Status: DC

## 2022-08-19 NOTE — Assessment & Plan Note (Signed)
s/p fall - tripped at the gas station

## 2022-08-19 NOTE — Assessment & Plan Note (Addendum)
Post injury Hold Eliquis x 3-4 d Try rinsing with the baking soda and salt mixture: mix 1/4 teaspoon of baking soda and 1/8 teaspoon of salt in 1 cup of warm water. Stir it up. Then swish it around in your mouth and spit it out.

## 2022-08-19 NOTE — Assessment & Plan Note (Signed)
S/p fall - ENT appt is pending

## 2022-08-19 NOTE — Telephone Encounter (Signed)
Community msg to [place] letting her know it's done.

## 2022-08-19 NOTE — Telephone Encounter (Signed)
I saw that Dr. Melvyn Novas is asking for clarification.  Just need the paragraph that he put on the referral copied and pasted into his most recent office visit note.  Here's the narrative from the referral:   "Patient has has daily productive cough lasting greater than 6 months requiring antibiotic therapy.  Flutter valve has been tried, but has not effectively mobilized secretions.  Considered and ruled out manual CPT as no consistent skilled caregiver available to perform therapy.  CT scan performed on 05/06/2022 confirms bronchiectasis.  Starting patient on vest therapy."   Thanks!  Melissa  This is clarification.   Please advise.

## 2022-08-19 NOTE — Patient Instructions (Signed)
  Hold Eliquis x 3-4 days   Try rinsing with the baking soda and salt mixture: mix 1/4 teaspoon of baking soda and 1/8 teaspoon of salt in 1 cup of warm water. Stir it up. Then swish it around in your mouth and spit it out.

## 2022-08-19 NOTE — Telephone Encounter (Signed)
done

## 2022-08-19 NOTE — Telephone Encounter (Signed)
I'm in [place] this week and unclear what I need to do here

## 2022-08-19 NOTE — Assessment & Plan Note (Signed)
On B12 

## 2022-08-19 NOTE — Assessment & Plan Note (Signed)
No change 

## 2022-08-19 NOTE — Progress Notes (Signed)
Subjective:  Patient ID: [name] Christensen, male    DOB: Aug 10, 1932  Age: 88 y.o. MRN: [id number]  CC: Follow-up (4 week f/u- Pt states he fell 2 weeks ago. (R) side of face is bruised, and also arms)   HPI [name] presents for post-fall concussion, R zigoma fx 2 wks ago. Pt saw his dentist. C/o bleeding from mouth  C./o fatigue  - sleeps during the day  Per hx: "[name] Christensen is a 88 y.o. male.   Patient with history of previous cardiac arrest status post defibrillator, hypertension, hyperlipidemia, CAD status post CABG presenting with fall and head injury.  States he was at the gas station and tried to walk over the gas pump hose and ended up tripping and falling hitting his face and right arm.  Did not lose consciousness.  Remembers the whole fall.  Complains of pain in his right face and his right arm.  No nausea or vomiting.  No chest pain or shortness of breath.  Denies any preceding dizziness, weakness, numbness or tingling. Does take Eliquis for unknown reason. Denies any syncopal episode.  Recalls the entire fall.   The history is provided by the patient and the EMS personnel. "        Outpatient Medications Prior to Visit  Medication Sig Dispense Refill   acetaminophen (TYLENOL) 500 MG tablet Take 500 mg by mouth at bedtime.     alfuzosin (UROXATRAL) 10 MG 24 hr tablet Take 10 mg by mouth every evening.      atorvastatin (LIPITOR) 40 MG tablet TAKE ONE-HALF TABLET BY MOUTH AT BEDTIME FOR HIGH CHOLESTEROL     benzonatate (TESSALON) 100 MG capsule      betamethasone dipropionate 0.05 % cream Apply 1 application. topically 2 (two) times daily as needed.     cinacalcet (SENSIPAR) 30 MG tablet Take 1 tablet (30 mg total) by mouth 3 (three) times a week. 40 tablet 3   clindamycin (CLEOCIN) 300 MG capsule Take 1 capsule (300 mg total) by mouth 3 (three) times daily. 21 capsule 0   Dextromethorphan-guaiFENesin (MUCINEX DM PO) Take 30 mg by mouth in the morning and at  bedtime.     dofetilide (TIKOSYN) 250 MCG capsule TAKE ONE CAPSULE BY MOUTH TWICE A DAY 180 capsule 0   donepezil (ARICEPT) 5 MG tablet Take 1 tablet (5 mg total) by mouth at bedtime. 90 tablet 3   dutasteride (AVODART) 0.5 MG capsule Take 0.5 mg by mouth every evening.      ELIQUIS 5 MG TABS tablet TAKE 1 TABLET BY MOUTH TWICE (2) DAILY 180 tablet 3   famotidine (PEPCID) 20 MG tablet Take 20 mg by mouth daily.     Multiple Vitamin (MULTIVITAMIN WITH MINERALS) TABS tablet Take 1 tablet by mouth every other day.     nitroGLYCERIN (NITROSTAT) 0.4 MG SL tablet DISSOLVE 1 TABLET UNDER TONGUE AS NEEDEDFOR CHEST PAIN. MAY REPEAT 5 MINUTES APART 3 TIMES IF NEEDED 25 tablet 3   omeprazole (PRILOSEC) 20 MG capsule Take 20 mg by mouth daily.     Plecanatide 3 MG TABS TAKE ONE TABLET BY MOUTH ONCE EVERY DAY FOR CONSTIPATION *REPLACES LINACLOTIDE (LINZESS)*     Polyethyl Glycol-Propyl Glycol (SYSTANE OP) Place 1 drop into both eyes daily as needed (burning eyes).      triamcinolone cream (KENALOG) 0.1 % APPLY 1 APPLICATION TOPICALLY TWO TIMES DAILY AS NEEDED AVOIDING FACE, GROIN ANDARMPITS 80 g 0   valsartan (DIOVAN) 80 MG tablet  Take 1 tablet (80 mg) by mouth once daily 90 tablet 2   vitamin B-12 (CYANOCOBALAMIN) 1000 MCG tablet Take 1,000 mcg by mouth daily.     escitalopram (LEXAPRO) 10 MG tablet TAKE 1 TABLET BY MOUTH ONCE DAILY 90 tablet 1   HYDROcodone bit-homatropine (HYCODAN) 5-1.5 MG/5ML syrup Take 5 mLs by mouth every 6 (six) hours as needed for cough. 240 mL 0   metoprolol succinate (TOPROL-XL) 50 MG 24 hr tablet TAKE 1 TABLET BY MOUTH TWICE A DAY. TAKEWITH OR IMMEDIATELY FOLLOWING A MEAL 180 tablet 3   No facility-administered medications prior to visit.    ROS: Review of Systems  Constitutional:  Positive for fatigue. Negative for appetite change and unexpected weight change.  HENT:  Negative for congestion, nosebleeds, sneezing, sore throat and trouble swallowing.   Eyes:  Negative for  itching and visual disturbance.  Respiratory:  Negative for cough.   Cardiovascular:  Negative for chest pain, palpitations and leg swelling.  Gastrointestinal:  Negative for abdominal distention, blood in stool, diarrhea and nausea.  Genitourinary:  Negative for frequency and hematuria.  Musculoskeletal:  Negative for back pain, gait problem, joint swelling and neck pain.  Skin:  Negative for rash.  Neurological:  Negative for dizziness, tremors, speech difficulty and weakness.  Hematological:  Bruises/bleeds easily.  Psychiatric/Behavioral:  Negative for agitation, dysphoric mood and sleep disturbance. The patient is not nervous/anxious.     Objective:  BP 118/70 (BP Location: Left Arm)   Pulse 89   Temp 98 F (36.7 C) (Oral)   Ht '5\' 6"'$  (1.676 m)   Wt 133 lb 12.8 oz (60.7 kg)   SpO2 97%   BMI 21.60 kg/m   BP Readings from Last 3 Encounters:  08/19/22 118/70  08/07/22 (!) 172/98  08/01/22 120/74    Wt Readings from Last 3 Encounters:  08/19/22 133 lb 12.8 oz (60.7 kg)  08/07/22 131 lb (59.4 kg)  08/01/22 135 lb (61.2 kg)    Physical Exam Constitutional:      General: He is not in acute distress.    Appearance: He is well-developed. He is not toxic-appearing.     Comments: NAD  Eyes:     Conjunctiva/sclera: Conjunctivae normal.     Pupils: Pupils are equal, round, and reactive to light.  Neck:     Thyroid: No thyromegaly.     Vascular: No JVD.  Cardiovascular:     Rate and Rhythm: Normal rate and regular rhythm.     Heart sounds: Normal heart sounds. No murmur heard.    No friction rub. No gallop.  Pulmonary:     Effort: Pulmonary effort is normal. No respiratory distress.     Breath sounds: Normal breath sounds. No wheezing or rales.  Chest:     Chest wall: No tenderness.  Abdominal:     General: Bowel sounds are normal. There is no distension.     Palpations: Abdomen is soft. There is no mass.     Tenderness: There is no abdominal tenderness. There is no  guarding or rebound.  Musculoskeletal:        General: No tenderness. Normal range of motion.     Cervical back: Normal range of motion.  Lymphadenopathy:     Cervical: No cervical adenopathy.  Skin:    General: Skin is warm and dry.     Findings: No rash.  Neurological:     Mental Status: He is alert and oriented to person, place, and time.  Cranial Nerves: No cranial nerve deficit.     Motor: No abnormal muscle tone.     Coordination: Coordination normal.     Gait: Gait normal.     Deep Tendon Reflexes: Reflexes are normal and symmetric.  Psychiatric:        Behavior: Behavior normal.        Thought Content: Thought content normal.        Judgment: Judgment normal.   R face is bruised    A total time of 45 minutes was spent preparing to see the patient, reviewing tests, x-rays, operative reports and other medical records.  Also, obtaining history and performing comprehensive physical exam.  Additionally, counseling the patient regarding the above listed issues.   Finally, documenting clinical information in the health records, coordination of care, educating the patient - concussion, bleeding  Lab Results  Component Value Date   WBC 10.3 08/07/2022   HGB 12.6 (L) 08/07/2022   HCT 37.0 (L) 08/07/2022   PLT 173 08/07/2022   GLUCOSE 110 (H) 08/07/2022   CHOL 107 12/30/2019   TRIG 102.0 12/30/2019   HDL 46.80 12/30/2019   LDLCALC 39 12/30/2019   ALT 10 07/03/2022   AST 19 07/03/2022   NA 134 (L) 08/07/2022   K 5.0 08/07/2022   CL 104 08/07/2022   CREATININE 1.10 08/07/2022   BUN 33 (H) 08/07/2022   CO2 24 08/07/2022   TSH 1.59 07/03/2022   PSA 12.45 (H) 02/06/2016   INR 1.1 04/02/2017   HGBA1C 6.1 12/30/2019    CT Head Wo Contrast  Result Date: 08/07/2022 CLINICAL DATA:  Fall. EXAM: CT HEAD WITHOUT CONTRAST CT MAXILLOFACIAL WITHOUT CONTRAST CT CERVICAL SPINE WITHOUT CONTRAST TECHNIQUE: Multidetector CT imaging of the head, cervical spine, and maxillofacial  structures were performed using the standard protocol without intravenous contrast. Multiplanar CT image reconstructions of the cervical spine and maxillofacial structures were also generated. RADIATION DOSE REDUCTION: This exam was performed according to the departmental dose-optimization program which includes automated exposure control, adjustment of the mA and/or kV according to patient size and/or use of iterative reconstruction technique. COMPARISON:  October 31, 2012. FINDINGS: CT HEAD FINDINGS Brain: Mild chronic ischemic white matter disease is noted. No mass effect or midline shift is noted. Ventricular size is within normal limits. There is no evidence of mass lesion, hemorrhage or acute infarction. Vascular: No hyperdense vessel or unexpected calcification. Skull: Normal. Negative for fracture or focal lesion. Other: None. CT MAXILLOFACIAL FINDINGS Osseous: The mandible is unremarkable. Mildly displaced fracture involving the anterior portion of the right orbital floor is noted with gas present in the inferior portion of the right orbit. There also appears to be moderately displaced and comminuted fractures involving the anterior posterior walls of the right maxillary sinus. Opacification of right maxillary sinus is noted consistent with hemorrhage. Also noted is nondisplaced fracture involving the right zygomatic arch. Orbits: Left orbit is unremarkable. As noted above, there is gas seen in inferior portion of right orbit anteriorly consistent with orbital blowout fracture. Sinuses: As noted above, opacification of right maxillary sinus is noted consistent with hemorrhage. Soft tissues: Negative. CT CERVICAL SPINE FINDINGS Alignment: Minimal grade 1 anterolisthesis of C5-6 is noted secondary to posterior facet joint hypertrophy. Skull base and vertebrae: No acute fracture. No primary bone lesion or focal pathologic process. Soft tissues and spinal canal: No prevertebral fluid or swelling. No visible  canal hematoma. Disc levels:  Mild degenerative changes are noted at C3-4 and C4-5. Upper chest: Negative. Other:  None. IMPRESSION: No acute intracranial abnormality seen. Mildly displaced right anterior orbital floor fracture is noted with hemorrhage in the right maxillary sinus as well as gas in the right orbit. Moderately displaced fractures are also seen involving the anterior and posterior aspects of the right maxillary sinus. Nondisplaced right zygomatic arch fracture is noted. Multilevel degenerative changes are noted cervical spine. No acute abnormality is noted in the cervical spine. Electronically Signed   By: Marijo Conception M.D.   On: 08/07/2022 16:18   CT Cervical Spine Wo Contrast  Result Date: 08/07/2022 CLINICAL DATA:  Fall. EXAM: CT HEAD WITHOUT CONTRAST CT MAXILLOFACIAL WITHOUT CONTRAST CT CERVICAL SPINE WITHOUT CONTRAST TECHNIQUE: Multidetector CT imaging of the head, cervical spine, and maxillofacial structures were performed using the standard protocol without intravenous contrast. Multiplanar CT image reconstructions of the cervical spine and maxillofacial structures were also generated. RADIATION DOSE REDUCTION: This exam was performed according to the departmental dose-optimization program which includes automated exposure control, adjustment of the mA and/or kV according to patient size and/or use of iterative reconstruction technique. COMPARISON:  October 31, 2012. FINDINGS: CT HEAD FINDINGS Brain: Mild chronic ischemic white matter disease is noted. No mass effect or midline shift is noted. Ventricular size is within normal limits. There is no evidence of mass lesion, hemorrhage or acute infarction. Vascular: No hyperdense vessel or unexpected calcification. Skull: Normal. Negative for fracture or focal lesion. Other: None. CT MAXILLOFACIAL FINDINGS Osseous: The mandible is unremarkable. Mildly displaced fracture involving the anterior portion of the right orbital floor is noted with  gas present in the inferior portion of the right orbit. There also appears to be moderately displaced and comminuted fractures involving the anterior posterior walls of the right maxillary sinus. Opacification of right maxillary sinus is noted consistent with hemorrhage. Also noted is nondisplaced fracture involving the right zygomatic arch. Orbits: Left orbit is unremarkable. As noted above, there is gas seen in inferior portion of right orbit anteriorly consistent with orbital blowout fracture. Sinuses: As noted above, opacification of right maxillary sinus is noted consistent with hemorrhage. Soft tissues: Negative. CT CERVICAL SPINE FINDINGS Alignment: Minimal grade 1 anterolisthesis of C5-6 is noted secondary to posterior facet joint hypertrophy. Skull base and vertebrae: No acute fracture. No primary bone lesion or focal pathologic process. Soft tissues and spinal canal: No prevertebral fluid or swelling. No visible canal hematoma. Disc levels:  Mild degenerative changes are noted at C3-4 and C4-5. Upper chest: Negative. Other: None. IMPRESSION: No acute intracranial abnormality seen. Mildly displaced right anterior orbital floor fracture is noted with hemorrhage in the right maxillary sinus as well as gas in the right orbit. Moderately displaced fractures are also seen involving the anterior and posterior aspects of the right maxillary sinus. Nondisplaced right zygomatic arch fracture is noted. Multilevel degenerative changes are noted cervical spine. No acute abnormality is noted in the cervical spine. Electronically Signed   By: Marijo Conception M.D.   On: 08/07/2022 16:18   CT Maxillofacial Wo Contrast  Result Date: 08/07/2022 CLINICAL DATA:  Fall. EXAM: CT HEAD WITHOUT CONTRAST CT MAXILLOFACIAL WITHOUT CONTRAST CT CERVICAL SPINE WITHOUT CONTRAST TECHNIQUE: Multidetector CT imaging of the head, cervical spine, and maxillofacial structures were performed using the standard protocol without intravenous  contrast. Multiplanar CT image reconstructions of the cervical spine and maxillofacial structures were also generated. RADIATION DOSE REDUCTION: This exam was performed according to the departmental dose-optimization program which includes automated exposure control, adjustment of the mA and/or kV according to patient  size and/or use of iterative reconstruction technique. COMPARISON:  October 31, 2012. FINDINGS: CT HEAD FINDINGS Brain: Mild chronic ischemic white matter disease is noted. No mass effect or midline shift is noted. Ventricular size is within normal limits. There is no evidence of mass lesion, hemorrhage or acute infarction. Vascular: No hyperdense vessel or unexpected calcification. Skull: Normal. Negative for fracture or focal lesion. Other: None. CT MAXILLOFACIAL FINDINGS Osseous: The mandible is unremarkable. Mildly displaced fracture involving the anterior portion of the right orbital floor is noted with gas present in the inferior portion of the right orbit. There also appears to be moderately displaced and comminuted fractures involving the anterior posterior walls of the right maxillary sinus. Opacification of right maxillary sinus is noted consistent with hemorrhage. Also noted is nondisplaced fracture involving the right zygomatic arch. Orbits: Left orbit is unremarkable. As noted above, there is gas seen in inferior portion of right orbit anteriorly consistent with orbital blowout fracture. Sinuses: As noted above, opacification of right maxillary sinus is noted consistent with hemorrhage. Soft tissues: Negative. CT CERVICAL SPINE FINDINGS Alignment: Minimal grade 1 anterolisthesis of C5-6 is noted secondary to posterior facet joint hypertrophy. Skull base and vertebrae: No acute fracture. No primary bone lesion or focal pathologic process. Soft tissues and spinal canal: No prevertebral fluid or swelling. No visible canal hematoma. Disc levels:  Mild degenerative changes are noted at C3-4 and  C4-5. Upper chest: Negative. Other: None. IMPRESSION: No acute intracranial abnormality seen. Mildly displaced right anterior orbital floor fracture is noted with hemorrhage in the right maxillary sinus as well as gas in the right orbit. Moderately displaced fractures are also seen involving the anterior and posterior aspects of the right maxillary sinus. Nondisplaced right zygomatic arch fracture is noted. Multilevel degenerative changes are noted cervical spine. No acute abnormality is noted in the cervical spine. Electronically Signed   By: Marijo Conception M.D.   On: 08/07/2022 16:18   DG Forearm Right  Result Date: 08/07/2022 CLINICAL DATA:  Fall.  Lateral and distal forearm skin tear.  Pain. EXAM: RIGHT FOREARM - 2 VIEW COMPARISON:  None Available. FINDINGS: No acute fracture is seen within the radius or ulna. Mild medial aspect of the elbow joint space narrowing and peripheral degenerative osteophytosis. Mild-to-moderate thumb carpometacarpal joint space narrowing and mild peripheral degenerative osteophytes. IMPRESSION: 1. No acute fracture. 2. Mild medial elbow osteoarthritis. Electronically Signed   By: Yvonne Kendall M.D.   On: 08/07/2022 16:07   DG Pelvis Portable  Result Date: 08/07/2022 CLINICAL DATA:  Mechanical fall.  Tripped over curb at gas station. EXAM: PORTABLE PELVIS 1-2 VIEWS COMPARISON:  CT abdomen and pelvis 12/13/2014 FINDINGS: There is diffuse decreased bone mineralization. Mild bilateral sacroiliac subchondral sclerosis. The bilateral femoroacetabular and pubic symphysis joint spaces are maintained. A surgical clip overlies the left hemipelvis. Vascular phleboliths overlie the left-greater-than-right pelvis. No acute fracture is seen. No dislocation. IMPRESSION: No acute fracture is seen. Electronically Signed   By: Yvonne Kendall M.D.   On: 08/07/2022 16:05   DG Chest Portable 1 View  Result Date: 08/07/2022 CLINICAL DATA:  Level 2 fall.  Tripped over curb at gas station. EXAM:  PORTABLE CHEST 1 VIEW COMPARISON:  Chest radiographs 08/01/2022 and 01/30/2022; CT chest 05/06/2022 FINDINGS: Status post median sternotomy. Cardiac silhouette is again mildly enlarged. Moderate calcification within the aortic arch. Moderate bilateral mid and lower lung interstitial thickening is similar to multiple prior radiographs and likely represents chronic scarring/interstitial lung disease. Mild biapical pleural thickening  is unchanged. No pleural effusion or pneumothorax. Left chest wall cardiac AICD with leads again overlying the right atrium and right ventricle. Moderate multilevel degenerative disc changes of the thoracic spine. IMPRESSION: 1. There is again moderate bilateral mid and lower lung interstitial thickening. Prior CT demonstrated bronchiectasis with airspace opacities suggesting chronic infection due to atypical mycobacterial infection. The radiographic appearance is similar to 08/01/2022 and 01/30/2022. 2. No pleural effusion. Electronically Signed   By: Yvonne Kendall M.D.   On: 08/07/2022 16:03    Assessment & Plan:   Problem List Items Addressed This Visit     Concussion     s/p fall - tripped at the gas station      Fatigue    Post-concussion Take Lexapro and Toprol qhs Reduce Toprol to 1 qhs       Memory problem    No change      Oral bleeding    Post injury Hold Eliquis x 3-4 d Try rinsing with the baking soda and salt mixture: mix 1/4 teaspoon of baking soda and 1/8 teaspoon of salt in 1 cup of warm water. Stir it up. Then swish it around in your mouth and spit it out.      Vitamin B12 deficiency (non anemic)    On B12      Zygoma fracture ([place])    S/p fall - ENT appt is pending      Other Visit Diagnoses     Needs flu shot    -  Primary   Relevant Orders   Flu Vaccine QUAD High Dose(Fluad) (Completed)         Meds ordered this encounter  Medications   metoprolol succinate (TOPROL-XL) 50 MG 24 hr tablet    Sig: TAKE 1 TABLET BY MOUTH  [facility]    Dispense:  90 tablet    Refill:  3   escitalopram (LEXAPRO) 10 MG tablet    Sig: Take 1 tablet (10 mg total) by mouth at bedtime.    Dispense:  90 tablet    Refill:  1      Follow-up: Return in about 6 weeks (around 09/30/2022) for a follow-up visit.  Walker Kehr, MD

## 2022-08-19 NOTE — Assessment & Plan Note (Signed)
Post-concussion Take Lexapro and Toprol qhs Reduce Toprol to 1 qhs

## 2022-08-21 ENCOUNTER — Telehealth: Payer: Self-pay | Admitting: Internal Medicine

## 2022-08-23 ENCOUNTER — Telehealth: Payer: Self-pay | Admitting: Internal Medicine

## 2022-08-23 NOTE — Telephone Encounter (Signed)
Called and spoke with patient. He verbalized understanding. Called [employer] and canceled the vest therapy.   Nothing further needed.

## 2022-08-23 NOTE — Telephone Encounter (Signed)
Called and spoke with patient's wife. Patient's wife stated that they wanted to cancel the vest therapy. Advised patient I would send a message back to Dr. Melvyn Novas and give them a call back after he responds.   MW, please advise.

## 2022-08-23 NOTE — Telephone Encounter (Signed)
Ok to cancel VEST

## 2022-08-24 ENCOUNTER — Other Ambulatory Visit: Payer: Self-pay | Admitting: Internal Medicine

## 2022-08-26 ENCOUNTER — Other Ambulatory Visit: Payer: Self-pay | Admitting: Internal Medicine

## 2022-08-26 ENCOUNTER — Telehealth: Payer: Self-pay | Admitting: Internal Medicine

## 2022-08-26 NOTE — Telephone Encounter (Signed)
Patient's wife states that patient is refusing to take the following medications:  escitalopram (LEXAPRO) 10 MG tablet donepezil (ARICEPT) 5 MG tablet   She would like a call back to discuss.  Call back number [phone]

## 2022-08-27 NOTE — Telephone Encounter (Signed)
Noted! Thank you

## 2022-08-28 ENCOUNTER — Other Ambulatory Visit (HOSPITAL_COMMUNITY): Payer: Self-pay

## 2022-08-28 ENCOUNTER — Other Ambulatory Visit: Payer: Self-pay

## 2022-08-28 ENCOUNTER — Telehealth: Payer: Self-pay

## 2022-08-28 MED ORDER — CINACALCET HCL 30 MG PO TABS: 30.0000 mg | ORAL_TABLET | ORAL | 0 refills | Status: AC

## 2022-08-28 NOTE — Telephone Encounter (Signed)
Patient Advocate Encounter   Received notification that prior authorization for Cinacalcet HCl '30MG'$  tablets is required/requested.   PA submitted on 08/28/22 to [employer] via CoverMyMeds Key [id number]  Status is pending

## 2022-09-02 NOTE — Telephone Encounter (Addendum)
Patient Advocate Encounter  Received notification from [employer] that the request for prior authorization for Cinacalcet HCl '30MG'$  tablets has been denied due to drug not prescribed for a medically accepted indication.  FDA-approved indications: Treatment of secondary hyperparathyroidism in adult patients with chronic kidney disease (CKD) on dialysis; treatment of hypercalcemia in adult patients with parathyroid carcinoma; primary hyperparathyroidism.    Denial letter attached to patient's chart  Key: [id number]

## 2022-09-02 NOTE — Telephone Encounter (Signed)
Noted.  Will close encounter.  

## 2022-09-18 ENCOUNTER — Other Ambulatory Visit: Payer: Self-pay | Admitting: Internal Medicine

## 2022-09-30 ENCOUNTER — Encounter: Payer: Self-pay | Admitting: Internal Medicine

## 2022-09-30 ENCOUNTER — Ambulatory Visit (INDEPENDENT_AMBULATORY_CARE_PROVIDER_SITE_OTHER): Payer: PPO | Admitting: Internal Medicine

## 2022-09-30 VITALS — BP 110/62 | HR 69 | Temp 98.1°F | Ht 66.0 in | Wt 131.0 lb

## 2022-09-30 DIAGNOSIS — R531 Weakness: Secondary | ICD-10-CM | POA: Diagnosis not present

## 2022-09-30 DIAGNOSIS — R0609 Other forms of dyspnea: Secondary | ICD-10-CM | POA: Diagnosis not present

## 2022-09-30 DIAGNOSIS — R413 Other amnesia: Secondary | ICD-10-CM

## 2022-09-30 DIAGNOSIS — R5382 Chronic fatigue, unspecified: Secondary | ICD-10-CM

## 2022-09-30 DIAGNOSIS — E785 Hyperlipidemia, unspecified: Secondary | ICD-10-CM | POA: Diagnosis not present

## 2022-09-30 DIAGNOSIS — J479 Bronchiectasis, uncomplicated: Secondary | ICD-10-CM

## 2022-09-30 LAB — URINALYSIS
Bilirubin Urine: NEGATIVE
Hgb urine dipstick: NEGATIVE
Ketones, ur: NEGATIVE
Leukocytes,Ua: NEGATIVE
Nitrite: NEGATIVE
Specific Gravity, Urine: 1.01 (ref 1.000–1.030)
Total Protein, Urine: NEGATIVE
Urine Glucose: NEGATIVE
Urobilinogen, UA: 0.2 (ref 0.0–1.0)
pH: 6 (ref 5.0–8.0)

## 2022-09-30 LAB — CBC WITH DIFFERENTIAL/PLATELET
Basophils Absolute: 0 10*3/uL (ref 0.0–0.1)
Basophils Relative: 0.3 % (ref 0.0–3.0)
Eosinophils Absolute: 0.1 10*3/uL (ref 0.0–0.7)
Eosinophils Relative: 1.9 % (ref 0.0–5.0)
HCT: 32.6 % — ABNORMAL LOW (ref 39.0–52.0)
Hemoglobin: 10.8 g/dL — ABNORMAL LOW (ref 13.0–17.0)
Lymphocytes Relative: 18.1 % (ref 12.0–46.0)
Lymphs Abs: 1.4 10*3/uL (ref 0.7–4.0)
MCHC: 33.1 g/dL (ref 30.0–36.0)
MCV: 90.8 fl (ref 78.0–100.0)
Monocytes Absolute: 0.5 10*3/uL (ref 0.1–1.0)
Monocytes Relative: 6.1 % (ref 3.0–12.0)
Neutro Abs: 5.7 10*3/uL (ref 1.4–7.7)
Neutrophils Relative %: 73.6 % (ref 43.0–77.0)
Platelets: 169 10*3/uL (ref 150.0–400.0)
RBC: 3.59 Mil/uL — ABNORMAL LOW (ref 4.22–5.81)
RDW: 14 % (ref 11.5–15.5)
WBC: 7.8 10*3/uL (ref 4.0–10.5)

## 2022-09-30 LAB — COMPREHENSIVE METABOLIC PANEL
ALT: 12 U/L (ref 0–53)
AST: 23 U/L (ref 0–37)
Albumin: 3.7 g/dL (ref 3.5–5.2)
Alkaline Phosphatase: 89 U/L (ref 39–117)
BUN: 18 mg/dL (ref 6–23)
CO2: 29 mEq/L (ref 19–32)
Calcium: 11 mg/dL — ABNORMAL HIGH (ref 8.4–10.5)
Chloride: 98 mEq/L (ref 96–112)
Creatinine, Ser: 1.05 mg/dL (ref 0.40–1.50)
GFR: 63.62 mL/min (ref >60.00)
Glucose, Bld: 98 mg/dL (ref 70–99)
Potassium: 4.3 mEq/L (ref 3.5–5.1)
Sodium: 131 mEq/L — ABNORMAL LOW (ref 135–145)
Total Bilirubin: 0.5 mg/dL (ref 0.2–1.2)
Total Protein: 7.7 g/dL (ref 6.0–8.3)

## 2022-09-30 LAB — LIPID PANEL
Cholesterol: 93 mg/dL (ref 0–200)
HDL: 44.8 mg/dL (ref >39.00)
LDL Cholesterol: 26 mg/dL (ref 0–99)
NonHDL: 48.42
Total CHOL/HDL Ratio: 2
Triglycerides: 113 mg/dL (ref 0.0–149.0)
VLDL: 22.6 mg/dL (ref 0.0–40.0)

## 2022-09-30 LAB — TSH: TSH: 1.51 u[IU]/mL (ref 0.35–5.50)

## 2022-09-30 NOTE — Assessment & Plan Note (Signed)
Cont on Aricept

## 2022-09-30 NOTE — Assessment & Plan Note (Addendum)
Multifactorial Start PT Labs

## 2022-09-30 NOTE — Assessment & Plan Note (Signed)
Chronic cough - bronchiectases Pt is seeing Dr Melvyn Novas

## 2022-09-30 NOTE — Progress Notes (Signed)
Subjective:  Patient ID: [name] Christensen, male    DOB: 1932-06-11  Age: 88 y.o. MRN: [id number]  CC: Follow-up (6 week f/u)   HPI [name] presents for CHF, memory issues, cough - bronchiectases Hard to eat after dental injury. C/o fatigue Pt is seeing Dr Melvyn Novas  He is here w/Betty   Outpatient Medications Prior to Visit  Medication Sig Dispense Refill   acetaminophen (TYLENOL) 500 MG tablet Take 500 mg by mouth at bedtime.     alfuzosin (UROXATRAL) 10 MG 24 hr tablet Take 10 mg by mouth every evening.      atorvastatin (LIPITOR) 40 MG tablet TAKE ONE-HALF TABLET BY MOUTH AT BEDTIME FOR HIGH CHOLESTEROL     benzonatate (TESSALON) 100 MG capsule      betamethasone dipropionate 0.05 % cream Apply 1 application. topically 2 (two) times daily as needed.     cinacalcet (SENSIPAR) 30 MG tablet Take 1 tablet (30 mg total) by mouth 3 (three) times a week. 25 tablet 0   clindamycin (CLEOCIN) 300 MG capsule Take 1 capsule (300 mg total) by mouth 3 (three) times daily. 21 capsule 0   Dextromethorphan-guaiFENesin (MUCINEX DM PO) Take 30 mg by mouth in the morning and at bedtime.     dofetilide (TIKOSYN) 250 MCG capsule TAKE 1 CAPSULE BY MOUTH TWICE A DAY 180 capsule 0   donepezil (ARICEPT) 5 MG tablet Take 1 tablet (5 mg total) by mouth at bedtime. 90 tablet 3   dutasteride (AVODART) 0.5 MG capsule Take 0.5 mg by mouth every evening.      ELIQUIS 5 MG TABS tablet TAKE 1 TABLET BY MOUTH TWICE (2) DAILY 180 tablet 3   escitalopram (LEXAPRO) 10 MG tablet Take 1 tablet (10 mg total) by mouth at bedtime. 90 tablet 1   famotidine (PEPCID) 20 MG tablet Take 20 mg by mouth daily.     HYDROcodone bit-homatropine (HYCODAN) 5-1.5 MG/5ML syrup TAKE 5 MLS BY MOUTH EVERY 6 HOURS AS NEEDED FOR COUGH 240 mL 0   metoprolol succinate (TOPROL-XL) 50 MG 24 hr tablet TAKE 1 TABLET BY MOUTH TWICE A DAY. TAKEWITH OR IMMEDIATELY FOLLOWING A MEAL 180 tablet 1   Multiple Vitamin (MULTIVITAMIN WITH MINERALS) TABS  tablet Take 1 tablet by mouth every other day.     nitroGLYCERIN (NITROSTAT) 0.4 MG SL tablet DISSOLVE 1 TABLET UNDER TONGUE AS NEEDEDFOR CHEST PAIN. MAY REPEAT 5 MINUTES APART 3 TIMES IF NEEDED 25 tablet 3   omeprazole (PRILOSEC) 20 MG capsule Take 20 mg by mouth daily.     Plecanatide 3 MG TABS TAKE ONE TABLET BY MOUTH ONCE EVERY DAY FOR CONSTIPATION *REPLACES LINACLOTIDE (LINZESS)*     Polyethyl Glycol-Propyl Glycol (SYSTANE OP) Place 1 drop into both eyes daily as needed (burning eyes).      triamcinolone cream (KENALOG) 0.1 % APPLY 1 APPLICATION TOPICALLY TWO TIMES DAILY AS NEEDED AVOIDING FACE, GROIN ANDARMPITS 80 g 0   valsartan (DIOVAN) 80 MG tablet Take 1 tablet (80 mg) by mouth once daily 90 tablet 2   vitamin B-12 (CYANOCOBALAMIN) 1000 MCG tablet Take 1,000 mcg by mouth daily.     No facility-administered medications prior to visit.    ROS: Review of Systems  Constitutional:  Positive for fatigue. Negative for appetite change and unexpected weight change.  HENT:  Negative for congestion, nosebleeds, sneezing, sore throat and trouble swallowing.   Eyes:  Negative for itching and visual disturbance.  Respiratory:  Positive for cough.   Cardiovascular:  Negative for chest pain, palpitations and leg swelling.  Gastrointestinal:  Negative for abdominal distention, blood in stool, diarrhea and nausea.  Genitourinary:  Negative for frequency and hematuria.  Musculoskeletal:  Negative for back pain, gait problem, joint swelling and neck pain.  Skin:  Negative for rash.  Neurological:  Negative for dizziness, tremors, speech difficulty and weakness.  Psychiatric/Behavioral:  Positive for decreased concentration. Negative for agitation, dysphoric mood, sleep disturbance and suicidal ideas. The patient is nervous/anxious.     Objective:  BP 110/62 (BP Location: Left Arm)   Pulse 69   Temp 98.1 F (36.7 C) (Oral)   Ht '5\' 6"'$  (1.676 m)   Wt 131 lb (59.4 kg)   SpO2 95%   BMI 21.14  kg/m   BP Readings from Last 3 Encounters:  09/30/22 110/62  08/19/22 118/70  08/07/22 (!) 172/98    Wt Readings from Last 3 Encounters:  09/30/22 131 lb (59.4 kg)  08/19/22 133 lb 12.8 oz (60.7 kg)  08/07/22 131 lb (59.4 kg)    Physical Exam Constitutional:      General: He is not in acute distress.    Appearance: Normal appearance. He is well-developed.     Comments: NAD  Eyes:     Conjunctiva/sclera: Conjunctivae normal.     Pupils: Pupils are equal, round, and reactive to light.  Neck:     Thyroid: No thyromegaly.     Vascular: No JVD.  Cardiovascular:     Rate and Rhythm: Normal rate and regular rhythm.     Heart sounds: Normal heart sounds. No murmur heard.    No friction rub. No gallop.  Pulmonary:     Effort: Pulmonary effort is normal. No respiratory distress.     Breath sounds: Normal breath sounds. No wheezing or rales.  Chest:     Chest wall: No tenderness.  Abdominal:     General: Bowel sounds are normal. There is no distension.     Palpations: Abdomen is soft. There is no mass.     Tenderness: There is no abdominal tenderness. There is no guarding or rebound.  Musculoskeletal:        General: No tenderness. Normal range of motion.     Cervical back: Normal range of motion.  Lymphadenopathy:     Cervical: No cervical adenopathy.  Skin:    General: Skin is warm and dry.     Findings: No rash.  Neurological:     Mental Status: He is alert.     Cranial Nerves: No cranial nerve deficit.     Motor: No abnormal muscle tone.     Coordination: Coordination normal.     Gait: Gait normal.     Deep Tendon Reflexes: Reflexes are normal and symmetric.  Psychiatric:        Behavior: Behavior normal.        Thought Content: Thought content normal.        Judgment: Judgment normal.     Lab Results  Component Value Date   WBC 10.3 08/07/2022   HGB 12.6 (L) 08/07/2022   HCT 37.0 (L) 08/07/2022   PLT 173 08/07/2022   GLUCOSE 110 (H) 08/07/2022   CHOL 107  12/30/2019   TRIG 102.0 12/30/2019   HDL 46.80 12/30/2019   LDLCALC 39 12/30/2019   ALT 10 07/03/2022   AST 19 07/03/2022   NA 134 (L) 08/07/2022   K 5.0 08/07/2022   CL 104 08/07/2022   CREATININE 1.10 08/07/2022   BUN 33 (H)  08/07/2022   CO2 24 08/07/2022   TSH 1.59 07/03/2022   PSA 12.45 (H) 02/06/2016   INR 1.1 04/02/2017   HGBA1C 6.1 12/30/2019    CT Head Wo Contrast  Result Date: 08/07/2022 CLINICAL DATA:  Fall. EXAM: CT HEAD WITHOUT CONTRAST CT MAXILLOFACIAL WITHOUT CONTRAST CT CERVICAL SPINE WITHOUT CONTRAST TECHNIQUE: Multidetector CT imaging of the head, cervical spine, and maxillofacial structures were performed using the standard protocol without intravenous contrast. Multiplanar CT image reconstructions of the cervical spine and maxillofacial structures were also generated. RADIATION DOSE REDUCTION: This exam was performed according to the departmental dose-optimization program which includes automated exposure control, adjustment of the mA and/or kV according to patient size and/or use of iterative reconstruction technique. COMPARISON:  October 31, 2012. FINDINGS: CT HEAD FINDINGS Brain: Mild chronic ischemic white matter disease is noted. No mass effect or midline shift is noted. Ventricular size is within normal limits. There is no evidence of mass lesion, hemorrhage or acute infarction. Vascular: No hyperdense vessel or unexpected calcification. Skull: Normal. Negative for fracture or focal lesion. Other: None. CT MAXILLOFACIAL FINDINGS Osseous: The mandible is unremarkable. Mildly displaced fracture involving the anterior portion of the right orbital floor is noted with gas present in the inferior portion of the right orbit. There also appears to be moderately displaced and comminuted fractures involving the anterior posterior walls of the right maxillary sinus. Opacification of right maxillary sinus is noted consistent with hemorrhage. Also noted is nondisplaced fracture  involving the right zygomatic arch. Orbits: Left orbit is unremarkable. As noted above, there is gas seen in inferior portion of right orbit anteriorly consistent with orbital blowout fracture. Sinuses: As noted above, opacification of right maxillary sinus is noted consistent with hemorrhage. Soft tissues: Negative. CT CERVICAL SPINE FINDINGS Alignment: Minimal grade 1 anterolisthesis of C5-6 is noted secondary to posterior facet joint hypertrophy. Skull base and vertebrae: No acute fracture. No primary bone lesion or focal pathologic process. Soft tissues and spinal canal: No prevertebral fluid or swelling. No visible canal hematoma. Disc levels:  Mild degenerative changes are noted at C3-4 and C4-5. Upper chest: Negative. Other: None. IMPRESSION: No acute intracranial abnormality seen. Mildly displaced right anterior orbital floor fracture is noted with hemorrhage in the right maxillary sinus as well as gas in the right orbit. Moderately displaced fractures are also seen involving the anterior and posterior aspects of the right maxillary sinus. Nondisplaced right zygomatic arch fracture is noted. Multilevel degenerative changes are noted cervical spine. No acute abnormality is noted in the cervical spine. Electronically Signed   By: Marijo Conception M.D.   On: 08/07/2022 16:18   CT Cervical Spine Wo Contrast  Result Date: 08/07/2022 CLINICAL DATA:  Fall. EXAM: CT HEAD WITHOUT CONTRAST CT MAXILLOFACIAL WITHOUT CONTRAST CT CERVICAL SPINE WITHOUT CONTRAST TECHNIQUE: Multidetector CT imaging of the head, cervical spine, and maxillofacial structures were performed using the standard protocol without intravenous contrast. Multiplanar CT image reconstructions of the cervical spine and maxillofacial structures were also generated. RADIATION DOSE REDUCTION: This exam was performed according to the departmental dose-optimization program which includes automated exposure control, adjustment of the mA and/or kV according  to patient size and/or use of iterative reconstruction technique. COMPARISON:  October 31, 2012. FINDINGS: CT HEAD FINDINGS Brain: Mild chronic ischemic white matter disease is noted. No mass effect or midline shift is noted. Ventricular size is within normal limits. There is no evidence of mass lesion, hemorrhage or acute infarction. Vascular: No hyperdense vessel or unexpected calcification. Skull:  Normal. Negative for fracture or focal lesion. Other: None. CT MAXILLOFACIAL FINDINGS Osseous: The mandible is unremarkable. Mildly displaced fracture involving the anterior portion of the right orbital floor is noted with gas present in the inferior portion of the right orbit. There also appears to be moderately displaced and comminuted fractures involving the anterior posterior walls of the right maxillary sinus. Opacification of right maxillary sinus is noted consistent with hemorrhage. Also noted is nondisplaced fracture involving the right zygomatic arch. Orbits: Left orbit is unremarkable. As noted above, there is gas seen in inferior portion of right orbit anteriorly consistent with orbital blowout fracture. Sinuses: As noted above, opacification of right maxillary sinus is noted consistent with hemorrhage. Soft tissues: Negative. CT CERVICAL SPINE FINDINGS Alignment: Minimal grade 1 anterolisthesis of C5-6 is noted secondary to posterior facet joint hypertrophy. Skull base and vertebrae: No acute fracture. No primary bone lesion or focal pathologic process. Soft tissues and spinal canal: No prevertebral fluid or swelling. No visible canal hematoma. Disc levels:  Mild degenerative changes are noted at C3-4 and C4-5. Upper chest: Negative. Other: None. IMPRESSION: No acute intracranial abnormality seen. Mildly displaced right anterior orbital floor fracture is noted with hemorrhage in the right maxillary sinus as well as gas in the right orbit. Moderately displaced fractures are also seen involving the anterior  and posterior aspects of the right maxillary sinus. Nondisplaced right zygomatic arch fracture is noted. Multilevel degenerative changes are noted cervical spine. No acute abnormality is noted in the cervical spine. Electronically Signed   By: Marijo Conception M.D.   On: 08/07/2022 16:18   CT Maxillofacial Wo Contrast  Result Date: 08/07/2022 CLINICAL DATA:  Fall. EXAM: CT HEAD WITHOUT CONTRAST CT MAXILLOFACIAL WITHOUT CONTRAST CT CERVICAL SPINE WITHOUT CONTRAST TECHNIQUE: Multidetector CT imaging of the head, cervical spine, and maxillofacial structures were performed using the standard protocol without intravenous contrast. Multiplanar CT image reconstructions of the cervical spine and maxillofacial structures were also generated. RADIATION DOSE REDUCTION: This exam was performed according to the departmental dose-optimization program which includes automated exposure control, adjustment of the mA and/or kV according to patient size and/or use of iterative reconstruction technique. COMPARISON:  October 31, 2012. FINDINGS: CT HEAD FINDINGS Brain: Mild chronic ischemic white matter disease is noted. No mass effect or midline shift is noted. Ventricular size is within normal limits. There is no evidence of mass lesion, hemorrhage or acute infarction. Vascular: No hyperdense vessel or unexpected calcification. Skull: Normal. Negative for fracture or focal lesion. Other: None. CT MAXILLOFACIAL FINDINGS Osseous: The mandible is unremarkable. Mildly displaced fracture involving the anterior portion of the right orbital floor is noted with gas present in the inferior portion of the right orbit. There also appears to be moderately displaced and comminuted fractures involving the anterior posterior walls of the right maxillary sinus. Opacification of right maxillary sinus is noted consistent with hemorrhage. Also noted is nondisplaced fracture involving the right zygomatic arch. Orbits: Left orbit is unremarkable. As  noted above, there is gas seen in inferior portion of right orbit anteriorly consistent with orbital blowout fracture. Sinuses: As noted above, opacification of right maxillary sinus is noted consistent with hemorrhage. Soft tissues: Negative. CT CERVICAL SPINE FINDINGS Alignment: Minimal grade 1 anterolisthesis of C5-6 is noted secondary to posterior facet joint hypertrophy. Skull base and vertebrae: No acute fracture. No primary bone lesion or focal pathologic process. Soft tissues and spinal canal: No prevertebral fluid or swelling. No visible canal hematoma. Disc levels:  Mild degenerative changes  are noted at C3-4 and C4-5. Upper chest: Negative. Other: None. IMPRESSION: No acute intracranial abnormality seen. Mildly displaced right anterior orbital floor fracture is noted with hemorrhage in the right maxillary sinus as well as gas in the right orbit. Moderately displaced fractures are also seen involving the anterior and posterior aspects of the right maxillary sinus. Nondisplaced right zygomatic arch fracture is noted. Multilevel degenerative changes are noted cervical spine. No acute abnormality is noted in the cervical spine. Electronically Signed   By: Marijo Conception M.D.   On: 08/07/2022 16:18   DG Forearm Right  Result Date: 08/07/2022 CLINICAL DATA:  Fall.  Lateral and distal forearm skin tear.  Pain. EXAM: RIGHT FOREARM - 2 VIEW COMPARISON:  None Available. FINDINGS: No acute fracture is seen within the radius or ulna. Mild medial aspect of the elbow joint space narrowing and peripheral degenerative osteophytosis. Mild-to-moderate thumb carpometacarpal joint space narrowing and mild peripheral degenerative osteophytes. IMPRESSION: 1. No acute fracture. 2. Mild medial elbow osteoarthritis. Electronically Signed   By: Yvonne Kendall M.D.   On: 08/07/2022 16:07   DG Pelvis Portable  Result Date: 08/07/2022 CLINICAL DATA:  Mechanical fall.  Tripped over curb at gas station. EXAM: PORTABLE PELVIS  1-2 VIEWS COMPARISON:  CT abdomen and pelvis 12/13/2014 FINDINGS: There is diffuse decreased bone mineralization. Mild bilateral sacroiliac subchondral sclerosis. The bilateral femoroacetabular and pubic symphysis joint spaces are maintained. A surgical clip overlies the left hemipelvis. Vascular phleboliths overlie the left-greater-than-right pelvis. No acute fracture is seen. No dislocation. IMPRESSION: No acute fracture is seen. Electronically Signed   By: Yvonne Kendall M.D.   On: 08/07/2022 16:05   DG Chest Portable 1 View  Result Date: 08/07/2022 CLINICAL DATA:  Level 2 fall.  Tripped over curb at gas station. EXAM: PORTABLE CHEST 1 VIEW COMPARISON:  Chest radiographs 08/01/2022 and 01/30/2022; CT chest 05/06/2022 FINDINGS: Status post median sternotomy. Cardiac silhouette is again mildly enlarged. Moderate calcification within the aortic arch. Moderate bilateral mid and lower lung interstitial thickening is similar to multiple prior radiographs and likely represents chronic scarring/interstitial lung disease. Mild biapical pleural thickening is unchanged. No pleural effusion or pneumothorax. Left chest wall cardiac AICD with leads again overlying the right atrium and right ventricle. Moderate multilevel degenerative disc changes of the thoracic spine. IMPRESSION: 1. There is again moderate bilateral mid and lower lung interstitial thickening. Prior CT demonstrated bronchiectasis with airspace opacities suggesting chronic infection due to atypical mycobacterial infection. The radiographic appearance is similar to 08/01/2022 and 01/30/2022. 2. No pleural effusion. Electronically Signed   By: Yvonne Kendall M.D.   On: 08/07/2022 16:03    Assessment & Plan:   Problem List Items Addressed This Visit     Bronchiectasis ([place]) assoc with possible MAI    F/u w/Dr Melvyn Novas On Mucinex      Relevant Orders   TSH   Urinalysis   CBC with Differential/Platelet   Lipid panel   Comprehensive metabolic panel    Dyslipidemia   Relevant Orders   TSH   Lipid panel   Dyspnea on exertion - Primary    Chronic cough - bronchiectases Pt is seeing Dr Melvyn Novas      Relevant Orders   TSH   Urinalysis   CBC with Differential/Platelet   Lipid panel   Comprehensive metabolic panel   Memory problem    Cont on Aricept      Weakness    Multifactorial Start PT Labs      Relevant Orders  Ambulatory referral to Physical Therapy      No orders of the defined types were placed in this encounter.     Follow-up: Return in about 3 months (around 12/31/2022) for a follow-up visit.  Walker Kehr, MD

## 2022-09-30 NOTE — Assessment & Plan Note (Signed)
F/u w/Dr Melvyn Novas On Mucinex

## 2022-10-02 ENCOUNTER — Ambulatory Visit (INDEPENDENT_AMBULATORY_CARE_PROVIDER_SITE_OTHER): Payer: PPO

## 2022-10-02 DIAGNOSIS — I255 Ischemic cardiomyopathy: Secondary | ICD-10-CM | POA: Diagnosis not present

## 2022-10-02 LAB — CUP PACEART REMOTE DEVICE CHECK
Battery Remaining Longevity: 31 mo
Battery Remaining Percentage: 35 %
Battery Voltage: 2.9 V
Brady Statistic AP VP Percent: 1 %
Brady Statistic AP VS Percent: 60 %
Brady Statistic AS VP Percent: 1.7 %
Brady Statistic AS VS Percent: 37 %
Brady Statistic RA Percent Paced: 57 %
Brady Statistic RV Percent Paced: 2.5 %
Date Time Interrogation Session: 20231108020015
HighPow Impedance: 69 Ohm
HighPow Impedance: 69 Ohm
Implantable Lead Connection Status: 753985
Implantable Lead Connection Status: 753985
Implantable Lead Implant Date: 20131218
Implantable Lead Implant Date: 20180514
Implantable Lead Location: 753859
Implantable Lead Location: 753860
Implantable Lead Model: 5076
Implantable Pulse Generator Implant Date: 20180514
Lead Channel Impedance Value: 390 Ohm
Lead Channel Impedance Value: 490 Ohm
Lead Channel Pacing Threshold Amplitude: 0.5 V
Lead Channel Pacing Threshold Amplitude: 1.25 V
Lead Channel Pacing Threshold Pulse Width: 0.5 ms
Lead Channel Pacing Threshold Pulse Width: 0.5 ms
Lead Channel Sensing Intrinsic Amplitude: 4.1 mV
Lead Channel Sensing Intrinsic Amplitude: 4.4 mV
Lead Channel Setting Pacing Amplitude: 2 V
Lead Channel Setting Pacing Amplitude: 2.5 V
Lead Channel Setting Pacing Pulse Width: 0.5 ms
Lead Channel Setting Sensing Sensitivity: 0.5 mV
Pulse Gen Serial Number: 1245762

## 2022-10-04 DIAGNOSIS — N401 Enlarged prostate with lower urinary tract symptoms: Secondary | ICD-10-CM | POA: Diagnosis not present

## 2022-10-04 DIAGNOSIS — R3912 Poor urinary stream: Secondary | ICD-10-CM | POA: Diagnosis not present

## 2022-10-04 DIAGNOSIS — R972 Elevated prostate specific antigen [PSA]: Secondary | ICD-10-CM | POA: Diagnosis not present

## 2022-10-10 ENCOUNTER — Ambulatory Visit: Payer: PPO

## 2022-10-14 ENCOUNTER — Ambulatory Visit: Payer: PPO

## 2022-10-15 ENCOUNTER — Other Ambulatory Visit: Payer: Self-pay

## 2022-10-15 NOTE — Progress Notes (Signed)
Remote ICD transmission.   

## 2022-10-16 ENCOUNTER — Ambulatory Visit: Payer: PPO

## 2022-10-22 ENCOUNTER — Ambulatory Visit: Payer: PPO

## 2022-10-24 ENCOUNTER — Ambulatory Visit: Payer: PPO

## 2022-10-29 ENCOUNTER — Ambulatory Visit: Payer: PPO

## 2022-11-01 ENCOUNTER — Ambulatory Visit: Payer: PPO

## 2022-11-05 ENCOUNTER — Encounter: Payer: Self-pay | Admitting: Internal Medicine

## 2022-11-05 ENCOUNTER — Other Ambulatory Visit
Admission: RE | Admit: 2022-11-05 | Discharge: 2022-11-05 | Disposition: A | Discharge status: Home / Self Care | Payer: PPO | Source: Ambulatory Visit | Attending: Internal Medicine | Admitting: Internal Medicine

## 2022-11-05 ENCOUNTER — Ambulatory Visit: Payer: PPO | Attending: Internal Medicine | Admitting: Internal Medicine

## 2022-11-05 VITALS — BP 120/72 | HR 70 | Ht 67.0 in | Wt 131.0 lb

## 2022-11-05 DIAGNOSIS — Z95810 Presence of automatic (implantable) cardiac defibrillator: Secondary | ICD-10-CM | POA: Diagnosis not present

## 2022-11-05 DIAGNOSIS — I255 Ischemic cardiomyopathy: Secondary | ICD-10-CM | POA: Diagnosis not present

## 2022-11-05 DIAGNOSIS — I5022 Chronic systolic (congestive) heart failure: Secondary | ICD-10-CM

## 2022-11-05 DIAGNOSIS — I493 Ventricular premature depolarization: Secondary | ICD-10-CM

## 2022-11-05 DIAGNOSIS — I4819 Other persistent atrial fibrillation: Secondary | ICD-10-CM | POA: Diagnosis not present

## 2022-11-05 DIAGNOSIS — Z79899 Other long term (current) drug therapy: Secondary | ICD-10-CM | POA: Insufficient documentation

## 2022-11-05 LAB — PACEMAKER DEVICE OBSERVATION

## 2022-11-05 LAB — MAGNESIUM: Magnesium: 2.1 mg/dL (ref 1.7–2.4)

## 2022-11-05 NOTE — Progress Notes (Unsigned)
Patient Care Team: Plotnikov, Georgina Quint, MD as PCP - General (Internal Medicine) Wendall Stade, MD as PCP - Cardiology (Cardiology) Duke Salvia, MD as PCP - Electrophysiology (Cardiology) Lindell Noe Lehman Prom., MD (Urology) [name], MD as Consulting Physician (General Surgery) Pyrtle, Carie Caddy, MD as Consulting Physician (Gastroenterology) [name], MD as Referring Physician (Ophthalmology) Nyoka Cowden, MD as Consulting Physician (Pulmonary Disease) Romero Belling, MD (Inactive) as Consulting Physician (Endocrinology)   HPI  [name] Christensen is a 88 y.o. male   seen in follow-up for ischemic cardiomyopathy with progressive left ventricular dysfunction in the context of frequent PVCs and chronic congestive heart failure.  He has persistent atrial fibrillation for which he was started on dofetilide 8/20  ( as well as PVCs) and anticoagulation with Apixaban    Have been considering AFib, Pacing and PVCs as cause of his symptoms and LV dysfunction>> reprogramming of his device eliminated pacing ( RBBB), dofetilide >>sinus, so the PVCs remain unaddressed with failed Anti-arrhythmic drugs suppression.  Most recently tried to increase rate for overdrive suppression, with some concern about encroaching on AV conduction and triggering RV apical pacing   Seen recently by Dr. Theadora Rama because of cough change to [facility]; is unclear whether the patient is taking both drugs now or not.   No bleeding Fatigue, not very active with chronic stable dyspnea    DATE TEST PVC  1/15 Holter 13%  1/18 Holter 31%  2/20 ZIO 7.2% ( all one morphology)   9/20 Pacer 12%  11/20 Pacer 11%  2/21 Pacer 9.4%  9/21  13%  5/23 Pacer 15% (est from AV interval plot)      DATE TEST EF    12/13 Cath   Patent Grafts  2/15 Echo   45-50 %    12/17 Echo   50-55 %    4/19 Echo 20-25%    10/19 Echo  25-30%    1/20 Echo  20-25%    6/20 LHC 20-25%  LIMA-LADatretic SVG-OM,SVG-D with filling of LAD,SVG-OM patent  11/20 Echo  25-30%   4/22 Echo  40-45%   9/22 Echo   40-45%     Date Cr K Mg Hgb  6/20 1.14 4.9 2.0(3/20) 12.5   8/20 1.03 4.2 1.8 12.8  2/21 1.06 4.0 2.1 12.7  7/21 1.19 4.8    9/21 1.14 5.0 2.1 11.3  4/22 0.97 4.0  12.8  9/22 0.97 4.3  10.8  1/23     11.7  5/23 1.2 4.1    11/23 1.05 4.3  10.8   Antiarrhythmics Date Reason stopped  amio 7/19    dofetilide          Records and Results Reviewed *  Past Medical History:  Diagnosis Date   Actinic keratosis 06/09/2019   L sup forehead at hairline - bx proven   AICD (automatic cardioverter/defibrillator) present 04/07/2017   Allergy    Anxiety    BPH (benign prostatic hypertrophy)    elevated PSA Dr. Lindell Noe Bx 2010   Cardiac arrest [facility])    a. out-of-hospital arrest 10/29/2012 - EF 40-45%, patent grafts on cath, received [employer] AICD.   Carotid disease, bilateral (HCC)    a. 0-39% by doppers.   Cataract    bil cataracts removed   CHF (congestive heart failure) (HCC)    Coronary artery disease    a. s/p MI/CABG 2005. b. s/p cath at time of VF arrest 10/2013 - grafts patent.  Cough    Diverticulosis    Elevated LFTs    a. 10/2012 felt due to cardiac arrest - Hepatitis C Ab reactive from 10/30/2012>>Hep C RNA PCR negative 11/17/2012.    GERD (gastroesophageal reflux disease)    Hyperlipidemia    Hypertension    Internal hemorrhoids    Myocardial infarction (HCC)    2005 - CABG x 5 2005   RBBB    Tubular adenoma of colon    Ventricular bigeminy    a. Event monitor 01/2013: NSR with PVCs and occ bigeminy.    Past Surgical History:  Procedure Laterality Date   APPENDECTOMY     CARDIAC CATHETERIZATION  2007   with patent graft anatomy atretic left internal mammary  artery to the LAD which is nonobstructive. Will restart study June 08, 2007   CARDIOVERSION N/A 03/27/2018   Procedure: CARDIOVERSION;  Surgeon: Iran Ouch, MD;  Location: [facility] ORS;   Service: Cardiovascular;  Laterality: N/A;   CARDIOVERSION N/A 01/04/2019   Procedure: CARDIOVERSION (CATH LAB);  Surgeon: Iran Ouch, MD;  Location: [facility] ORS;  Service: Cardiovascular;  Laterality: N/A;   CARDIOVERSION N/A 07/07/2019   Procedure: CARDIOVERSION;  Surgeon: Wendall Stade, MD;  Location: [facility] ENDOSCOPY;  Service: Cardiovascular;  Laterality: N/A;   CATARACT EXTRACTION W/PHACO Right 04/23/2016   Procedure: CATARACT EXTRACTION PHACO AND INTRAOCULAR LENS PLACEMENT (IOC);  Surgeon: [name], MD;  Location: [facility] ORS;  Service: Ophthalmology;  Laterality: Right;  [place] 1.09 AP% 19.3 CDE 13.38 Fluid pack lot # [id number]   CHOLECYSTECTOMY N/A 06/05/2017   Procedure: LAPAROSCOPIC CHOLECYSTECTOMY WITH INTRAOPERATIVE CHOLANGIOGRAM ERAS PATHWAY POSSIBLE NEEDLE CORE BIOPSY OF LIVER;  Surgeon: [name], MD;  Location: [facility] OR;  Service: General;  Laterality: N/A;  ERAS PATHWAY   COLONOSCOPY     CORONARY ARTERY BYPASS GRAFT  2005   EYE SURGERY     ICD GENERATOR CHANGEOUT N/A 04/07/2017   Procedure: ICD Generator Changeout;  Surgeon: Duke Salvia, MD;  Location: [facility] INVASIVE CV LAB;  Service: Cardiovascular;  Laterality: N/A;   IMPLANTABLE CARDIOVERTER DEFIBRILLATOR IMPLANT N/A 11/15/2012   STJ single chamber ICD implanted by Dr Graciela Husbands for cardiac arrest    INGUINAL HERNIA REPAIR Bilateral 01/17/2015   Procedure: BILATERAL LAPAROSCOPIC INGUINAL HERNIA REPAIR WITH LEFT FEMORAL HERNIA REPAIR;  Surgeon: [name], MD;  Location: [facility] OR;  Service: General;  Laterality: Bilateral;   INSERTION OF MESH Bilateral 01/17/2015   Procedure: INSERTION OF MESH;  Surgeon: [name], MD;  Location: [facility] OR;  Service: General;  Laterality: Bilateral;   LAPAROSCOPIC CHOLECYSTECTOMY  06/05/2017   LEAD INSERTION N/A 04/07/2017   Procedure: RA Lead Insertion;  Surgeon: Duke Salvia, MD;  Location: [facility] INVASIVE CV LAB;  Service: Cardiovascular;  Laterality: N/A;   LEAD REVISION/REPAIR N/A 04/08/2017    Procedure: Atrial Lead Revision/Repair;  Surgeon: Duke Salvia, MD;  Location: [facility] INVASIVE CV LAB;  Service: Cardiovascular;  Laterality: N/A;   LEFT HEART CATH AND CORONARY ANGIOGRAPHY Left 05/14/2019   Procedure: LEFT HEART CATH AND CORONARY ANGIOGRAPHY;  Surgeon: Iran Ouch, MD;  Location: [facility] INVASIVE CV LAB;  Service: Cardiovascular;  Laterality: Left;   LEFT HEART CATHETERIZATION WITH CORONARY/GRAFT ANGIOGRAM N/A 2012-12-04   Procedure: LEFT HEART CATHETERIZATION WITH Isabel Caprice;  Surgeon: [name], MD;  Location: [facility] CATH LAB;  Service: Cardiovascular;  Laterality: N/A;   LIVER BIOPSY N/A 06/05/2017   Procedure: SINGLE SITE LIVER BIOPSY ERAS PATHWAY;  Surgeon: [name], MD;  Location: [facility]  OR;  Service: General;  Laterality: N/A;  ERAS PATHWAY   LUMBAR FUSION  09/2007    Current Meds  Medication Sig   acetaminophen (TYLENOL) 500 MG tablet Take 500 mg by mouth at bedtime.   alfuzosin (UROXATRAL) 10 MG 24 hr tablet Take 10 mg by mouth every evening.    atorvastatin (LIPITOR) 40 MG tablet TAKE ONE-HALF TABLET BY MOUTH AT BEDTIME FOR HIGH CHOLESTEROL   benzonatate (TESSALON) 100 MG capsule as needed.   betamethasone dipropionate 0.05 % cream Apply 1 application. topically 2 (two) times daily as needed.   cinacalcet (SENSIPAR) 30 MG tablet Take 1 tablet (30 mg total) by mouth 3 (three) times a week.   clindamycin (CLEOCIN) 300 MG capsule Take 1 capsule (300 mg total) by mouth 3 (three) times daily.   Dextromethorphan-guaiFENesin (MUCINEX DM PO) Take 30 mg by mouth in the morning and at bedtime.   dofetilide (TIKOSYN) 250 MCG capsule TAKE 1 CAPSULE BY MOUTH TWICE A DAY   donepezil (ARICEPT) 5 MG tablet Take 1 tablet (5 mg total) by mouth at bedtime.   dutasteride (AVODART) 0.5 MG capsule Take 0.5 mg by mouth every evening.    ELIQUIS 5 MG TABS tablet TAKE 1 TABLET BY MOUTH TWICE (2) DAILY   escitalopram (LEXAPRO) 10 MG tablet Take 1 tablet (10 mg total) by  mouth at bedtime.   famotidine (PEPCID) 20 MG tablet Take 20 mg by mouth daily.   guaiFENesin (MUCINEX) 600 MG 12 hr tablet Take 600 mg by mouth as directed.   HYDROcodone bit-homatropine (HYCODAN) 5-1.5 MG/5ML syrup TAKE 5 MLS BY MOUTH EVERY 6 HOURS AS NEEDED FOR COUGH   metoprolol succinate (TOPROL-XL) 50 MG 24 hr tablet TAKE 1 TABLET BY MOUTH TWICE A DAY. TAKEWITH OR IMMEDIATELY FOLLOWING A MEAL   Multiple Vitamin (MULTIVITAMIN WITH MINERALS) TABS tablet Take 1 tablet by mouth every other day.   nitroGLYCERIN (NITROSTAT) 0.4 MG SL tablet DISSOLVE 1 TABLET UNDER TONGUE AS NEEDEDFOR CHEST PAIN. MAY REPEAT 5 MINUTES APART 3 TIMES IF NEEDED   omeprazole (PRILOSEC) 20 MG capsule Take 20 mg by mouth daily.   Plecanatide 3 MG TABS TAKE ONE TABLET BY MOUTH ONCE EVERY DAY FOR CONSTIPATION *REPLACES LINACLOTIDE (LINZESS)*   Polyethyl Glycol-Propyl Glycol (SYSTANE OP) Place 1 drop into both eyes daily as needed (burning eyes).    triamcinolone cream (KENALOG) 0.1 % APPLY 1 APPLICATION TOPICALLY TWO TIMES DAILY AS NEEDED AVOIDING FACE, GROIN ANDARMPITS   valsartan (DIOVAN) 80 MG tablet Take 1 tablet (80 mg) by mouth once daily   vitamin B-12 (CYANOCOBALAMIN) 1000 MCG tablet Take 1,000 mcg by mouth daily.   Current Outpatient Medications on File Prior to Visit  Medication Sig Dispense Refill   acetaminophen (TYLENOL) 500 MG tablet Take 500 mg by mouth at bedtime.     alfuzosin (UROXATRAL) 10 MG 24 hr tablet Take 10 mg by mouth every evening.      atorvastatin (LIPITOR) 40 MG tablet TAKE ONE-HALF TABLET BY MOUTH AT BEDTIME FOR HIGH CHOLESTEROL     benzonatate (TESSALON) 100 MG capsule as needed.     betamethasone dipropionate 0.05 % cream Apply 1 application. topically 2 (two) times daily as needed.     cinacalcet (SENSIPAR) 30 MG tablet Take 1 tablet (30 mg total) by mouth 3 (three) times a week. 25 tablet 0   clindamycin (CLEOCIN) 300 MG capsule Take 1 capsule (300 mg total) by mouth 3 (three) times  daily. 21 capsule 0   Dextromethorphan-guaiFENesin [facility]  DM PO) Take 30 mg by mouth in the morning and at bedtime.     dofetilide (TIKOSYN) 250 MCG capsule TAKE 1 CAPSULE BY MOUTH TWICE A DAY 180 capsule 0   donepezil (ARICEPT) 5 MG tablet Take 1 tablet (5 mg total) by mouth at bedtime. 90 tablet 3   dutasteride (AVODART) 0.5 MG capsule Take 0.5 mg by mouth every evening.      ELIQUIS 5 MG TABS tablet TAKE 1 TABLET BY MOUTH TWICE (2) DAILY 180 tablet 3   escitalopram (LEXAPRO) 10 MG tablet Take 1 tablet (10 mg total) by mouth at bedtime. 90 tablet 1   famotidine (PEPCID) 20 MG tablet Take 20 mg by mouth daily.     guaiFENesin (MUCINEX) 600 MG 12 hr tablet Take 600 mg by mouth as directed.     HYDROcodone bit-homatropine (HYCODAN) 5-1.5 MG/5ML syrup TAKE 5 MLS BY MOUTH EVERY 6 HOURS AS NEEDED FOR COUGH 240 mL 0   metoprolol succinate (TOPROL-XL) 50 MG 24 hr tablet TAKE 1 TABLET BY MOUTH TWICE A DAY. TAKEWITH OR IMMEDIATELY FOLLOWING A MEAL 180 tablet 1   Multiple Vitamin (MULTIVITAMIN WITH MINERALS) TABS tablet Take 1 tablet by mouth every other day.     nitroGLYCERIN (NITROSTAT) 0.4 MG SL tablet DISSOLVE 1 TABLET UNDER TONGUE AS NEEDEDFOR CHEST PAIN. MAY REPEAT 5 MINUTES APART 3 TIMES IF NEEDED 25 tablet 3   omeprazole (PRILOSEC) 20 MG capsule Take 20 mg by mouth daily.     Plecanatide 3 MG TABS TAKE ONE TABLET BY MOUTH ONCE EVERY DAY FOR CONSTIPATION *REPLACES LINACLOTIDE (LINZESS)*     Polyethyl Glycol-Propyl Glycol (SYSTANE OP) Place 1 drop into both eyes daily as needed (burning eyes).      triamcinolone cream (KENALOG) 0.1 % APPLY 1 APPLICATION TOPICALLY TWO TIMES DAILY AS NEEDED AVOIDING FACE, GROIN ANDARMPITS 80 g 0   valsartan (DIOVAN) 80 MG tablet Take 1 tablet (80 mg) by mouth once daily 90 tablet 2   vitamin B-12 (CYANOCOBALAMIN) 1000 MCG tablet Take 1,000 mcg by mouth daily.     No current facility-administered medications on file prior to visit.     Allergies  Allergen  Reactions   Ezetimibe Other (See Comments)    Pt was in coma for 6 days, numbness   Penicillins Other (See Comments)    BLISTERS BETWEEN FINGERS Did it involve swelling of the face/tongue/throat, SOB, or low BP? No Did it involve sudden or severe rash/hives, skin peeling, or any reaction on the inside of your mouth or nose? Yes Did you need to seek medical attention at a hospital or doctor's office? No When did it last happen?      60 years If all above answers are "NO", may proceed with cephalosporin use.    Pravastatin Sodium Other (See Comments)    Aches and pains in joints   Rosuvastatin Other (See Comments)    MYALGIAS   Niacin Anxiety and Other (See Comments)    "Makes me feel nervous, jittery"      Review of Systems negative except from HPI and PMH  Physical Exam BP 120/72 (BP Location: Left Arm, Patient Position: Sitting, Cuff Size: Normal)   Pulse 70   Ht 5\' 7"  (1.702 m)   Wt 131 lb (59.4 kg)   SpO2 97%   BMI 20.52 kg/m  Well developed and well nourished in no acute distress HENT normal Neck supple with JVP-flat Clear Device pocket well healed; without hematoma or erythema.  There is no  tethering  Regular rate and rhythm, no  gallop No murmur Abd-soft with active BS No Clubbing cyanosis  edema Skin-warm and dry A & Oriented  Grossly normal sensory and motor function  ECG a pacing @ 70 36/16/47  Device function is normal. Programming changes   See Paceart for details    PVC count seems to be low as there is no disruption histogram pattern  ASSESSMENT & PLAN:    Atrial fibrillation/flutter-persistent   PVCs-right bundle branch superior axis --identified by his device by ventricular rates slower than paced rate of 70 or AV interval shorter than 300 ms   Cardiomyopathy recurrent   History of aborted sudden cardiac death     Implantable defibrillator-St. Jude      Ischemic cardiomyopathy with prior CABG     Congestive Heart Failure chronic  systolic  High Risk Medication Surveillance,Dofetilide   Right bundle branch block.  Anemia   Sinus bradycardia/chronotropic incompetence  Bronchiectasis   No interval atrial fibrillation.  Continue dofetilide.  Needs surveillance labs.  Anemia continues to be an ongoing issue.  Per his PCP.  PVC count seems to be relatively quiescient.  Based on history and data, we will continue him on the valsartan and metoprolol 50 twice daily  Lengthy discussion regarding the challenges of holding relative relative to family concerns about his driving           .  Sherryl Manges, MD  11/05/2022 11:18 AM     [facility] [address] [place] [address] [phone] (office) [phone] (fax) yes.

## 2022-11-05 NOTE — Patient Instructions (Signed)
Medication Instructions:  - Your physician recommends that you continue on your current medications as directed. Please refer to the Current Medication list given to you today.  *If you need a refill on your cardiac medications before your next appointment, please call your pharmacy*   Lab Work: - Your physician recommends that you have lab work: at your convenience  [place] Entrance at [facility] 1st desk on the right to check in (REGISTRATION)  Lab hours: Monday- Friday (7:30 am- 5:30 pm)    If you have labs (blood work) drawn today and your tests are completely normal, you will receive your results only by: MyChart Message (if you have MyChart) OR A paper copy in the mail If you have any lab test that is abnormal or we need to change your treatment, we will call you to review the results.   Testing/Procedures: - none ordered   Follow-Up: At [facility], you and your health needs are our priority.  As part of our continuing mission to provide you with exceptional heart care, we have created designated Provider Care Teams.  These Care Teams include your primary Cardiologist (physician) and Advanced Practice Providers (APPs -  Physician Assistants and Nurse Practitioners) who all work together to provide you with the care you need, when you need it.  We recommend signing up for the patient portal called "MyChart".  Sign up information is provided on this After Visit Summary.  MyChart is used to connect with patients for Virtual Visits (Telemedicine).  Patients are able to view lab/test results, encounter notes, upcoming appointments, etc.  Non-urgent messages can be sent to your provider as well.   To learn more about what you can do with MyChart, go to [email or web address].    Your next appointment:   6 month(s)  The format for your next appointment:   In Person  Provider:   Virl Axe, MD    Other Instructions N/a  Important Information About  [place]

## 2022-11-06 ENCOUNTER — Ambulatory Visit: Payer: PPO

## 2022-11-08 ENCOUNTER — Ambulatory Visit: Payer: PPO

## 2022-11-13 ENCOUNTER — Ambulatory Visit: Payer: PPO

## 2022-11-14 ENCOUNTER — Telehealth: Payer: Self-pay | Admitting: Internal Medicine

## 2022-11-14 NOTE — Telephone Encounter (Signed)
He should be okay.  Do not take guaifenesin for 1 day.  Then he can take it as prescribed.

## 2022-11-14 NOTE — Telephone Encounter (Signed)
Patient's wife called and stated that the patient was prescribed guaiFENesin (MUCINEX) 600 MG 12 hr tablet  and he accidentally took more than he was prescribed. Patient's wife is concerned because the patient has been drowsy and also she states that the patient does need a refill on the medication because he's been taking more than he should have. Patient's wife is more concerned that her husband has been sleeping more and taken the wrong dosage. Patient's wife would like a callback as soon as possible at  [phone].

## 2022-11-15 ENCOUNTER — Ambulatory Visit: Payer: PPO

## 2022-11-19 ENCOUNTER — Encounter: Payer: Self-pay | Admitting: *Deleted

## 2022-11-19 ENCOUNTER — Emergency Department: Payer: PPO

## 2022-11-19 ENCOUNTER — Other Ambulatory Visit: Payer: Self-pay

## 2022-11-19 DIAGNOSIS — I959 Hypotension, unspecified: Secondary | ICD-10-CM | POA: Diagnosis not present

## 2022-11-19 DIAGNOSIS — I10 Essential (primary) hypertension: Secondary | ICD-10-CM | POA: Diagnosis not present

## 2022-11-19 DIAGNOSIS — J9601 Acute respiratory failure with hypoxia: Secondary | ICD-10-CM | POA: Insufficient documentation

## 2022-11-19 DIAGNOSIS — J09X9 Influenza due to identified novel influenza A virus with other manifestations: Secondary | ICD-10-CM | POA: Insufficient documentation

## 2022-11-19 DIAGNOSIS — I509 Heart failure, unspecified: Secondary | ICD-10-CM | POA: Insufficient documentation

## 2022-11-19 DIAGNOSIS — M19012 Primary osteoarthritis, left shoulder: Secondary | ICD-10-CM | POA: Diagnosis not present

## 2022-11-19 DIAGNOSIS — U071 COVID-19: Secondary | ICD-10-CM | POA: Insufficient documentation

## 2022-11-19 DIAGNOSIS — M25512 Pain in left shoulder: Secondary | ICD-10-CM | POA: Insufficient documentation

## 2022-11-19 DIAGNOSIS — M50321 Other cervical disc degeneration at C4-C5 level: Secondary | ICD-10-CM | POA: Diagnosis not present

## 2022-11-19 DIAGNOSIS — I110 Hypertensive heart disease with heart failure: Secondary | ICD-10-CM | POA: Diagnosis not present

## 2022-11-19 DIAGNOSIS — I2510 Atherosclerotic heart disease of native coronary artery without angina pectoris: Secondary | ICD-10-CM | POA: Insufficient documentation

## 2022-11-19 DIAGNOSIS — Z8719 Personal history of other diseases of the digestive system: Secondary | ICD-10-CM | POA: Diagnosis not present

## 2022-11-19 DIAGNOSIS — I252 Old myocardial infarction: Secondary | ICD-10-CM | POA: Diagnosis not present

## 2022-11-19 DIAGNOSIS — R0602 Shortness of breath: Secondary | ICD-10-CM | POA: Diagnosis not present

## 2022-11-19 DIAGNOSIS — E785 Hyperlipidemia, unspecified: Secondary | ICD-10-CM | POA: Diagnosis not present

## 2022-11-19 DIAGNOSIS — R569 Unspecified convulsions: Secondary | ICD-10-CM | POA: Diagnosis not present

## 2022-11-19 DIAGNOSIS — R55 Syncope and collapse: Secondary | ICD-10-CM | POA: Diagnosis not present

## 2022-11-19 DIAGNOSIS — J90 Pleural effusion, not elsewhere classified: Secondary | ICD-10-CM | POA: Diagnosis not present

## 2022-11-19 DIAGNOSIS — M25519 Pain in unspecified shoulder: Secondary | ICD-10-CM | POA: Diagnosis not present

## 2022-11-19 DIAGNOSIS — J101 Influenza due to other identified influenza virus with other respiratory manifestations: Secondary | ICD-10-CM | POA: Diagnosis not present

## 2022-11-19 LAB — BASIC METABOLIC PANEL
Anion gap: 6 (ref 5–15)
BUN: 22 mg/dL (ref 8–23)
CO2: 24 mmol/L (ref 22–32)
Calcium: 9.9 mg/dL (ref 8.9–10.3)
Chloride: 101 mmol/L (ref 98–111)
Creatinine, Ser: 1.36 mg/dL — ABNORMAL HIGH (ref 0.61–1.24)
GFR, Estimated: 50 mL/min — ABNORMAL LOW (ref >60)
Glucose, Bld: 116 mg/dL — ABNORMAL HIGH (ref 70–99)
Potassium: 4.5 mmol/L (ref 3.5–5.1)
Sodium: 131 mmol/L — ABNORMAL LOW (ref 135–145)

## 2022-11-19 LAB — CBC
HCT: 29.6 % — ABNORMAL LOW (ref 39.0–52.0)
Hemoglobin: 9.5 g/dL — ABNORMAL LOW (ref 13.0–17.0)
MCH: 30 pg (ref 26.0–34.0)
MCHC: 32.1 g/dL (ref 30.0–36.0)
MCV: 93.4 fL (ref 80.0–100.0)
Platelets: 136 10*3/uL — ABNORMAL LOW (ref 150–400)
RBC: 3.17 MIL/uL — ABNORMAL LOW (ref 4.22–5.81)
RDW: 13.6 % (ref 11.5–15.5)
WBC: 7.6 10*3/uL (ref 4.0–10.5)
nRBC: 0 % (ref 0.0–0.2)

## 2022-11-19 LAB — TROPONIN I (HIGH SENSITIVITY): Troponin I (High Sensitivity): 10 ng/L (ref <18)

## 2022-11-19 NOTE — ED Provider Triage Note (Signed)
Emergency Medicine Provider Triage Evaluation Note  [name] Christensen, a 88 y.o. male  was evaluated in triage.  Pt complains of LOC. He presents from home via EMS for a syncopal episode. Pt with a history of CABG, ICD, cirrhosis,  and HTN presents for evaluation.   Review of Systems  Positive: syncope Negative: Head injury  Physical Exam  BP (!) 120/57 (BP Location: Left Arm)   Pulse 70   Temp 99.3 F (37.4 C) (Oral)   Resp 20   Ht '5\' 7"'$  (1.702 m)   Wt 59 kg   SpO2 (!) 88%   BMI 20.37 kg/m  Gen:   Awake, no distress  NAD Resp:  Normal effort  MSK:   Moves extremities without difficulty  Other:    Medical Decision Making  Medically screening exam initiated at 10:57 PM.  Appropriate orders placed.  [name] Christensen was informed that the remainder of the evaluation will be completed by another provider, this initial triage assessment does not replace that evaluation, and the importance of remaining in the ED until their evaluation is complete.  Patient to the ED for evaluation of a syncopal episode at home.    Melvenia Needles, PA-C 11/19/22 2259

## 2022-11-19 NOTE — ED Notes (Signed)
Pt placed on 2 liters oxygen via [place].

## 2022-11-19 NOTE — ED Triage Notes (Addendum)
Pt brought in via ems from home with syncopal episode tonight.  Hx cabg, pacemaker.  No chest pain or sob.   No n/v/d  pt alert  speech clear.  Pt has left shoulder pain   limited rom.  Pt injured during syncopal episode.   Iv in place.

## 2022-11-20 ENCOUNTER — Ambulatory Visit: Payer: PPO

## 2022-11-20 ENCOUNTER — Emergency Department: Payer: PPO

## 2022-11-20 ENCOUNTER — Inpatient Hospital Stay: Payer: PPO

## 2022-11-20 ENCOUNTER — Emergency Department
Admission: EM | Admit: 2022-11-20 | Discharge: 2022-11-20 | Disposition: A | Discharge status: Home / Self Care | Payer: PPO | Attending: Emergency Medicine | Admitting: Emergency Medicine

## 2022-11-20 DIAGNOSIS — J9601 Acute respiratory failure with hypoxia: Secondary | ICD-10-CM

## 2022-11-20 DIAGNOSIS — Y92009 Unspecified place in unspecified non-institutional (private) residence as the place of occurrence of the external cause: Secondary | ICD-10-CM

## 2022-11-20 DIAGNOSIS — R55 Syncope and collapse: Secondary | ICD-10-CM | POA: Diagnosis present

## 2022-11-20 DIAGNOSIS — J101 Influenza due to other identified influenza virus with other respiratory manifestations: Secondary | ICD-10-CM | POA: Diagnosis not present

## 2022-11-20 DIAGNOSIS — R0902 Hypoxemia: Secondary | ICD-10-CM | POA: Diagnosis not present

## 2022-11-20 DIAGNOSIS — U071 COVID-19: Secondary | ICD-10-CM | POA: Diagnosis not present

## 2022-11-20 DIAGNOSIS — R531 Weakness: Secondary | ICD-10-CM | POA: Diagnosis not present

## 2022-11-20 DIAGNOSIS — W19XXXA Unspecified fall, initial encounter: Secondary | ICD-10-CM

## 2022-11-20 DIAGNOSIS — M50321 Other cervical disc degeneration at C4-C5 level: Secondary | ICD-10-CM | POA: Diagnosis not present

## 2022-11-20 DIAGNOSIS — M79622 Pain in left upper arm: Secondary | ICD-10-CM | POA: Diagnosis not present

## 2022-11-20 DIAGNOSIS — I10 Essential (primary) hypertension: Secondary | ICD-10-CM | POA: Diagnosis not present

## 2022-11-20 DIAGNOSIS — S0990XA Unspecified injury of head, initial encounter: Secondary | ICD-10-CM

## 2022-11-20 DIAGNOSIS — M19012 Primary osteoarthritis, left shoulder: Secondary | ICD-10-CM | POA: Diagnosis not present

## 2022-11-20 DIAGNOSIS — Z8719 Personal history of other diseases of the digestive system: Secondary | ICD-10-CM | POA: Diagnosis not present

## 2022-11-20 DIAGNOSIS — J90 Pleural effusion, not elsewhere classified: Secondary | ICD-10-CM | POA: Diagnosis not present

## 2022-11-20 LAB — RESP PANEL BY RT-PCR (RSV, FLU A&B, COVID)  RVPGX2
Influenza A by PCR: POSITIVE — AB
Influenza B by PCR: NEGATIVE
Resp Syncytial Virus by PCR: NEGATIVE
SARS Coronavirus 2 by RT PCR: POSITIVE — AB

## 2022-11-20 LAB — TROPONIN I (HIGH SENSITIVITY): Troponin I (High Sensitivity): 9 ng/L (ref <18)

## 2022-11-20 LAB — D-DIMER, QUANTITATIVE: D-Dimer, Quant: 0.93 ug/mL-FEU — ABNORMAL HIGH (ref 0.00–0.50)

## 2022-11-20 MED ORDER — GUAIFENESIN ER 600 MG PO TB12: 600.0000 mg | ORAL_TABLET | Freq: Two times a day (BID) | ORAL | Status: DC | PRN

## 2022-11-20 MED ORDER — ADULT MULTIVITAMIN W/MINERALS CH
1.0000 | ORAL_TABLET | ORAL | Status: DC
  Administered 2022-11-20: 1 via ORAL
  Filled 2022-11-20: qty 1

## 2022-11-20 MED ORDER — IOHEXOL 350 MG/ML SOLN
75.0000 mL | Freq: Once | INTRAVENOUS | Status: AC | PRN
  Administered 2022-11-20: 75 mL via INTRAVENOUS

## 2022-11-20 MED ORDER — APIXABAN 5 MG PO TABS
5.0000 mg | ORAL_TABLET | Freq: Two times a day (BID) | ORAL | Status: DC
  Administered 2022-11-20: 5 mg via ORAL
  Filled 2022-11-20: qty 1

## 2022-11-20 MED ORDER — DONEPEZIL HCL 5 MG PO TABS: 5.0000 mg | ORAL_TABLET | Freq: Every day | ORAL | Status: DC

## 2022-11-20 MED ORDER — VALSARTAN 80 MG PO TABS: 80.0000 mg | ORAL_TABLET | Freq: Every day | ORAL | 2 refills | Status: DC

## 2022-11-20 MED ORDER — ATORVASTATIN CALCIUM 20 MG PO TABS
20.0000 mg | ORAL_TABLET | Freq: Every day | ORAL | Status: DC
  Administered 2022-11-20: 20 mg via ORAL
  Filled 2022-11-20: qty 1

## 2022-11-20 MED ORDER — METOPROLOL SUCCINATE ER 50 MG PO TB24
50.0000 mg | ORAL_TABLET | Freq: Two times a day (BID) | ORAL | Status: DC
  Filled 2022-11-20 (×2): qty 1

## 2022-11-20 MED ORDER — OSELTAMIVIR PHOSPHATE 30 MG PO CAPS
30.0000 mg | ORAL_CAPSULE | Freq: Two times a day (BID) | ORAL | Status: DC
  Administered 2022-11-20: 30 mg via ORAL
  Filled 2022-11-20: qty 1

## 2022-11-20 MED ORDER — PLECANATIDE 3 MG PO TABS: 3.0000 mg | ORAL_TABLET | Freq: Every day | ORAL | Status: DC

## 2022-11-20 MED ORDER — ESCITALOPRAM OXALATE 10 MG PO TABS: 10.0000 mg | ORAL_TABLET | Freq: Every day | ORAL | Status: DC

## 2022-11-20 MED ORDER — DOFETILIDE 250 MCG PO CAPS
250.0000 ug | ORAL_CAPSULE | Freq: Two times a day (BID) | ORAL | Status: DC
  Administered 2022-11-20: 250 ug via ORAL
  Filled 2022-11-20: qty 1

## 2022-11-20 MED ORDER — OSELTAMIVIR PHOSPHATE 30 MG PO CAPS: 30.0000 mg | ORAL_CAPSULE | Freq: Two times a day (BID) | ORAL | 0 refills | Status: AC

## 2022-11-20 MED ORDER — DUTASTERIDE 0.5 MG PO CAPS
0.5000 mg | ORAL_CAPSULE | Freq: Every evening | ORAL | Status: DC
  Filled 2022-11-20: qty 1

## 2022-11-20 MED ORDER — SODIUM CHLORIDE 0.9 % IV BOLUS (SEPSIS)
500.0000 mL | Freq: Once | INTRAVENOUS | Status: AC
  Administered 2022-11-20: 500 mL via INTRAVENOUS

## 2022-11-20 MED ORDER — ALFUZOSIN HCL ER 10 MG PO TB24
10.0000 mg | ORAL_TABLET | Freq: Every evening | ORAL | Status: DC
  Filled 2022-11-20: qty 1

## 2022-11-20 MED ORDER — PANTOPRAZOLE SODIUM 40 MG PO TBEC
40.0000 mg | DELAYED_RELEASE_TABLET | Freq: Every day | ORAL | Status: DC
  Administered 2022-11-20: 40 mg via ORAL
  Filled 2022-11-20: qty 1

## 2022-11-20 NOTE — ED Notes (Signed)
Patient Alert and oriented to baseline. Stable and ambulatory to baseline. Patient verbalized understanding of the discharge instructions.  Patient belongings were taken by the patient.   

## 2022-11-20 NOTE — ED Provider Notes (Signed)
[facility] Provider Note    Event Date/Time   First MD Initiated Contact with Patient 11/20/22 0423     (approximate)   History   Loss of Consciousness   HPI  [name] Christensen is a 88 y.o. male with history of CAD, hypertension, hyperlipidemia, cardiac arrest, CHF status post AICD who presents to the emergency department with shortness of breath, syncope.  Reports he passed out and hit his head and left shoulder.  No chest pain, neck or back pain, abdominal pain.  He is hypoxic here.  Does not wear oxygen at home.   History provided by patient and family.    Past Medical History:  Diagnosis Date   Actinic keratosis 06/09/2019   L sup forehead at hairline - bx proven   AICD (automatic cardioverter/defibrillator) present 04/07/2017   Allergy    Anxiety    BPH (benign prostatic hypertrophy)    elevated PSA Dr. Lindell Noe Bx 2010   Cardiac arrest [facility])    a. out-of-hospital arrest 11-22-12 - EF 40-45%, patent grafts on cath, received [employer] AICD.   Carotid disease, bilateral (HCC)    a. 0-39% by doppers.   Cataract    bil cataracts removed   CHF (congestive heart failure) (HCC)    Coronary artery disease    a. s/p MI/CABG 2005. b. s/p cath at time of VF arrest 10/2013 - grafts patent.   Cough    Diverticulosis    Elevated LFTs    a. 10/2012 felt due to cardiac arrest - Hepatitis C Ab reactive from 10/25/2012>>Hep C RNA PCR negative 11/22/2012.    GERD (gastroesophageal reflux disease)    Hyperlipidemia    Hypertension    Internal hemorrhoids    Myocardial infarction (HCC)    2005 - CABG x 5 2005   RBBB    Tubular adenoma of colon    Ventricular bigeminy    a. Event monitor 01/2013: NSR with PVCs and occ bigeminy.    Past Surgical History:  Procedure Laterality Date   APPENDECTOMY     CARDIAC CATHETERIZATION  2007   with patent graft anatomy atretic left internal mammary  artery to the LAD which is nonobstructive. Will restart study June 08, 2007   CARDIOVERSION N/A 03/27/2018   Procedure: CARDIOVERSION;  Surgeon: Iran Ouch, MD;  Location: [facility] ORS;  Service: Cardiovascular;  Laterality: N/A;   CARDIOVERSION N/A 01/04/2019   Procedure: CARDIOVERSION (CATH LAB);  Surgeon: Iran Ouch, MD;  Location: [facility] ORS;  Service: Cardiovascular;  Laterality: N/A;   CARDIOVERSION N/A 07/07/2019   Procedure: CARDIOVERSION;  Surgeon: Wendall Stade, MD;  Location: [facility] ENDOSCOPY;  Service: Cardiovascular;  Laterality: N/A;   CATARACT EXTRACTION W/PHACO Right 04/23/2016   Procedure: CATARACT EXTRACTION PHACO AND INTRAOCULAR LENS PLACEMENT (IOC);  Surgeon: [name], MD;  Location: [facility] ORS;  Service: Ophthalmology;  Laterality: Right;  [place] 1.09 AP% 19.3 CDE 13.38 Fluid pack lot # [id number]   CHOLECYSTECTOMY N/A 06/05/2017   Procedure: LAPAROSCOPIC CHOLECYSTECTOMY WITH INTRAOPERATIVE CHOLANGIOGRAM ERAS PATHWAY POSSIBLE NEEDLE CORE BIOPSY OF LIVER;  Surgeon: [name], MD;  Location: [facility] OR;  Service: General;  Laterality: N/A;  ERAS PATHWAY   COLONOSCOPY     CORONARY ARTERY BYPASS GRAFT  2005   EYE SURGERY     ICD GENERATOR CHANGEOUT N/A 04/07/2017   Procedure: ICD Generator Changeout;  Surgeon: Duke Salvia, MD;  Location: [facility] INVASIVE CV LAB;  Service: Cardiovascular;  Laterality: N/A;   IMPLANTABLE  CARDIOVERTER DEFIBRILLATOR IMPLANT N/A 11/14/2012   STJ single chamber ICD implanted by Dr Graciela Husbands for cardiac arrest    INGUINAL HERNIA REPAIR Bilateral 01/17/2015   Procedure: BILATERAL LAPAROSCOPIC INGUINAL HERNIA REPAIR WITH LEFT FEMORAL HERNIA REPAIR;  Surgeon: [name], MD;  Location: [facility] OR;  Service: General;  Laterality: Bilateral;   INSERTION OF MESH Bilateral 01/17/2015   Procedure: INSERTION OF MESH;  Surgeon: [name], MD;  Location: [facility] OR;  Service: General;  Laterality: Bilateral;   LAPAROSCOPIC CHOLECYSTECTOMY  06/05/2017   LEAD INSERTION N/A 04/07/2017   Procedure: RA Lead Insertion;  Surgeon: Duke Salvia, MD;  Location: [facility] INVASIVE CV LAB;  Service: Cardiovascular;  Laterality: N/A;   LEAD REVISION/REPAIR N/A 04/08/2017   Procedure: Atrial Lead Revision/Repair;  Surgeon: Duke Salvia, MD;  Location: [facility] INVASIVE CV LAB;  Service: Cardiovascular;  Laterality: N/A;   LEFT HEART CATH AND CORONARY ANGIOGRAPHY Left 05/14/2019   Procedure: LEFT HEART CATH AND CORONARY ANGIOGRAPHY;  Surgeon: Iran Ouch, MD;  Location: [facility] INVASIVE CV LAB;  Service: Cardiovascular;  Laterality: Left;   LEFT HEART CATHETERIZATION WITH CORONARY/GRAFT ANGIOGRAM N/A 11-27-2012   Procedure: LEFT HEART CATHETERIZATION WITH Isabel Caprice;  Surgeon: [name], MD;  Location: [facility] CATH LAB;  Service: Cardiovascular;  Laterality: N/A;   LIVER BIOPSY N/A 06/05/2017   Procedure: SINGLE SITE LIVER BIOPSY ERAS PATHWAY;  Surgeon: [name], MD;  Location: [facility] OR;  Service: General;  Laterality: N/A;  ERAS PATHWAY   LUMBAR FUSION  09/2007    MEDICATIONS:  Prior to Admission medications   Medication Sig Start Date End Date Taking? Authorizing Provider  acetaminophen (TYLENOL) 500 MG tablet Take 500 mg by mouth at bedtime.    [provider]  alfuzosin (UROXATRAL) 10 MG 24 hr tablet Take 10 mg by mouth every evening.     [provider]  atorvastatin (LIPITOR) 40 MG tablet TAKE ONE-HALF TABLET BY MOUTH AT BEDTIME FOR HIGH CHOLESTEROL 03/20/22   [provider]  benzonatate (TESSALON) 100 MG capsule as needed.    [provider]  betamethasone dipropionate 0.05 % cream Apply 1 application. topically 2 (two) times daily as needed.    [provider]  cinacalcet (SENSIPAR) 30 MG tablet Take 1 tablet (30 mg total) by mouth 3 (three) times a week. 08/28/22   Reather Littler, MD  clindamycin (CLEOCIN) 300 MG capsule Take 1 capsule (300 mg total) by mouth 3 (three) times daily. 08/07/22   Rancour, Jeannett Senior, MD  Dextromethorphan-guaiFENesin [facility] DM PO) Take 30 mg by mouth in the  morning and at bedtime.    [provider]  dofetilide (TIKOSYN) 250 MCG capsule TAKE 1 CAPSULE BY MOUTH TWICE A DAY 08/26/22   Duke Salvia, MD  donepezil (ARICEPT) 5 MG tablet Take 1 tablet (5 mg total) by mouth at bedtime. 07/03/22   Plotnikov, Georgina Quint, MD  dutasteride (AVODART) 0.5 MG capsule Take 0.5 mg by mouth every evening.     [provider]  ELIQUIS 5 MG TABS tablet TAKE 1 TABLET BY MOUTH TWICE (2) DAILY 09/10/21   Plotnikov, Georgina Quint, MD  escitalopram (LEXAPRO) 10 MG tablet Take 1 tablet (10 mg total) by mouth at bedtime. 08/19/22   Plotnikov, Georgina Quint, MD  famotidine (PEPCID) 20 MG tablet Take 20 mg by mouth daily.    [provider]  guaiFENesin (MUCINEX) 600 MG 12 hr tablet Take 600 mg by mouth as directed. 10/08/22   [provider]  HYDROcodone bit-homatropine (HYCODAN) 5-1.5 MG/5ML syrup TAKE 5 MLS BY MOUTH EVERY 6 HOURS AS NEEDED FOR COUGH 09/19/22   Plotnikov, Georgina Quint, MD  metoprolol succinate (TOPROL-XL) 50 MG 24 hr tablet TAKE 1 TABLET BY MOUTH TWICE A DAY. TAKEWITH OR IMMEDIATELY FOLLOWING A MEAL 08/27/22   Plotnikov, Georgina Quint, MD  Multiple Vitamin (MULTIVITAMIN WITH MINERALS) TABS tablet Take 1 tablet by mouth every other day.    [provider]  nitroGLYCERIN (NITROSTAT) 0.4 MG SL tablet DISSOLVE 1 TABLET UNDER TONGUE AS NEEDEDFOR CHEST PAIN. MAY REPEAT 5 MINUTES APART 3 TIMES IF NEEDED 05/30/21   Plotnikov, Georgina Quint, MD  omeprazole (PRILOSEC) 20 MG capsule Take 20 mg by mouth daily.    [provider]  Plecanatide 3 MG TABS TAKE ONE TABLET BY MOUTH ONCE EVERY DAY FOR CONSTIPATION *REPLACES LINACLOTIDE Karlene Einstein)* 03/25/22   [provider]  Polyethyl Glycol-Propyl Glycol (SYSTANE OP) Place 1 drop into both eyes daily as needed (burning eyes).     [provider]  triamcinolone cream (KENALOG) 0.1 % APPLY 1 APPLICATION TOPICALLY TWO TIMES DAILY AS NEEDED AVOIDING FACE, GROIN ANDARMPITS 03/13/22   Deirdre Evener, MD  valsartan (DIOVAN) 80 MG tablet Take 1 tablet (80 mg) by mouth once daily 04/24/22   Duke Salvia, MD  vitamin B-12 (CYANOCOBALAMIN) 1000 MCG tablet Take 1,000 mcg by mouth daily.    [provider]    Physical Exam   Triage Vital Signs: ED Triage Vitals [11/19/22 2234]  Enc Vitals Group     BP (!) 120/57     Pulse Rate 70     Resp 20     Temp 99.3 F (37.4 C)     Temp Source Oral     SpO2 (!) 88 %     Weight 130 lb 1.1 oz (59 kg)     Height 5\' 7"  (1.702 m)     Head Circumference      Peak Flow      Pain Score 0     Pain Loc      Pain Edu?      Excl. in GC?     Most recent vital signs: Vitals:   11/19/22 2234 11/20/22 0258  BP: (!) 120/57 (!) 96/57  Pulse: 70 70  Resp: 20 16  Temp: 99.3 F (37.4 C) 98.3 F (36.8 C)  SpO2: (!) 88% 98%     CONSTITUTIONAL: Alert and oriented and responds appropriately to questions. Well-appearing; well-nourished; GCS 15, elderly HEAD: Normocephalic; atraumatic EYES: Conjunctivae clear, PERRL, EOMI ENT: normal nose; no rhinorrhea; moist mucous membranes; pharynx without lesions noted; no dental injury; no septal hematoma, no epistaxis; no facial deformity or bony tenderness NECK: Supple, no midline spinal tenderness, step-off or deformity; trachea midline CARD: RRR; S1 and S2 appreciated; no murmurs, no clicks, no rubs, no gallops RESP: Normal chest excursion without splinting or tachypnea; breath sounds clear and equal bilaterally; no wheezes, no rhonchi, no rales hypoxic on room air CHEST:  chest wall stable, no crepitus or ecchymosis or deformity, nontender to palpation; no flail chest ABD/GI: Normal bowel sounds; non-distended; soft, non-tender, no rebound, no guarding; no ecchymosis or other lesions noted PELVIS:  stable, nontender to palpation BACK:  The back appears normal; no midline spinal tenderness, step-off or deformity EXT:, Abrasion and tenderness to the left shoulder but no other acute abnormality.   Compartments soft and extremities warm well-perfused.   SKIN: Normal color for age and race; warm NEURO: No facial  asymmetry, normal speech, moving all extremities equally  ED Results / Procedures / Treatments   LABS: (all labs ordered are listed, but only abnormal results are displayed) Labs Reviewed  RESP PANEL BY RT-PCR (RSV, FLU A&B, COVID)  RVPGX2 - Abnormal; Notable for the following components:      Result Value   SARS Coronavirus 2 by RT PCR POSITIVE (*)    Influenza A by PCR POSITIVE (*)    All other components within normal limits  BASIC METABOLIC PANEL - Abnormal; Notable for the following components:   Sodium 131 (*)    Glucose, Bld 116 (*)    Creatinine, Ser 1.36 (*)    GFR, Estimated 50 (*)    All other components within normal limits  CBC - Abnormal; Notable for the following components:   RBC 3.17 (*)    Hemoglobin 9.5 (*)    HCT 29.6 (*)    Platelets 136 (*)    All other components within normal limits  D-DIMER, QUANTITATIVE - Abnormal; Notable for the following components:   D-Dimer, Quant 0.93 (*)    All other components within normal limits  TROPONIN I (HIGH SENSITIVITY)  TROPONIN I (HIGH SENSITIVITY)     EKG:  EKG Interpretation  Date/Time:  Tuesday November 19 2022 22:34:32 EST Ventricular Rate:  70 PR Interval:  328 QRS Duration: 166 QT Interval:  438 QTC Calculation: 473 R Axis:   81 Text Interpretation: Atrial-paced rhythm with prolonged AV conduction Right bundle branch block Abnormal ECG When compared with ECG of 15-Jul-2019 12:29, Electronic atrial pacemaker has replaced Sinus rhythm QRS voltage has decreased QT has shortened Confirmed by Rochele Raring [phone]) on 11/20/2022 4:41:14 AM          RADIOLOGY: My personal review and interpretation of imaging: CT of the head and cervical spine show no acute traumatic injury.  CTA chest shows no PE.  I have personally reviewed all radiology reports. CT Angio Chest PE W and/or Wo  Contrast  Result Date: 11/20/2022 CLINICAL DATA:  88 year old male with history of positive D-dimer presenting with syncopal episode. Evaluate for potential pulmonary embolism. EXAM: CT ANGIOGRAPHY CHEST WITH CONTRAST TECHNIQUE: Multidetector CT imaging of the chest was performed using the standard protocol during bolus administration of intravenous contrast. Multiplanar CT image reconstructions and MIPs were obtained to evaluate the vascular anatomy. RADIATION DOSE REDUCTION: This exam was performed according to the departmental dose-optimization program which includes automated exposure control, adjustment of the mA and/or kV according to patient size and/or use of iterative reconstruction technique. CONTRAST:  75mL OMNIPAQUE IOHEXOL 350 MG/ML SOLN COMPARISON:  High-resolution chest CT 05/06/2022. FINDINGS: Cardiovascular: No filling defects within the pulmonary arterial tree to suggest pulmonary embolism. Heart size is borderline enlarged. There is no significant pericardial fluid, thickening or pericardial calcification. There is aortic atherosclerosis, as well as atherosclerosis of the great vessels of the mediastinum and the coronary arteries, including calcified atherosclerotic plaque in the left main, left anterior descending, left circumflex and right coronary arteries. Status post median sternotomy for CABG including LIMA to the LAD. Left-sided pacemaker device in place with lead tips terminating in the right atrial appendage and right ventricular apex. Calcifications of the aortic valve. Mediastinum/Nodes: No pathologically enlarged mediastinal or hilar lymph nodes. Several prominent but nonenlarged lymph nodes are clustered in the right hilar nodal station, several of which are partially calcified, nonspecific. Esophagus is unremarkable in appearance. No axillary lymphadenopathy. Lungs/Pleura: Again noted are scattered areas of cylindrical and mild varicose bronchiectasis, peripheral  bronchiolectasis,  severe thickening of the peribronchovascular interstitium, peribronchovascular micro and macronodularity as well as regional areas of architectural distortion and chronic scarring, similar to the prior study from 05/06/2022. No new focal airspace consolidation. Trace right pleural effusion. No left pleural effusion. Upper Abdomen: Aortic atherosclerosis. Status post cholecystectomy. Small calcified granuloma noted in the liver. Musculoskeletal: Median sternotomy wires. There are no aggressive appearing lytic or blastic lesions noted in the visualized portions of the skeleton. Review of the MIP images confirms the above findings. IMPRESSION: 1. No evidence of pulmonary embolism. 2. The appearance of the lungs is once again most suggestive of a chronic indolent atypical infectious process such as MAI (mycobacterium avium intracellulare), as detailed above. 3. Trace right pleural effusion. 4. Aortic atherosclerosis, in addition to left main and three-vessel coronary artery disease. Status post median sternotomy for CABG including LIMA to the LAD. Aortic Atherosclerosis (ICD10-I70.0). Electronically Signed   By: Trudie Reed M.D.   On: 11/20/2022 06:11   CT Cervical Spine Wo Contrast  Result Date: 11/20/2022 CLINICAL DATA:  88 year old male with syncope. Hypertension, cirrhosis. EXAM: CT CERVICAL SPINE WITHOUT CONTRAST TECHNIQUE: Multidetector CT imaging of the cervical spine was performed without intravenous contrast. Multiplanar CT image reconstructions were also generated. RADIATION DOSE REDUCTION: This exam was performed according to the departmental dose-optimization program which includes automated exposure control, adjustment of the mA and/or kV according to patient size and/or use of iterative reconstruction technique. COMPARISON:  Head CT today.  Cervical spine CT 08/07/2022. FINDINGS: Alignment: Stable cervical lordosis. Cervicothoracic junction alignment is within normal limits. Bilateral posterior  element alignment is within normal limits. Skull base and vertebrae: Visualized skull base is intact. No atlanto-occipital dissociation. Stable C1-C2 alignment. No acute osseous abnormality identified. Congenital incomplete ossification of the posterior C1 ring is stable. Soft tissues and spinal canal: No prevertebral fluid or swelling. No visible canal hematoma. Calcified cervical carotid atherosclerosis. Stable noncontrast visible neck soft tissues. Disc levels: Stable cervical spine degeneration, most pronounced C1-C2 through C4-C5. Upper chest: Mild T3 superior endplate compression is new since September, age indeterminate. Other visible upper thoracic levels appear stable and intact. Negative lung apices. Chronic left chest cardiac pacemaker type device. IMPRESSION: 1. No acute traumatic injury identified in the cervical spine. Stable cervical spine degeneration. 2. But a mild T3 superior endplate compression fracture is new since September, age indeterminate. Electronically Signed   By: Odessa Fleming M.D.   On: 11/20/2022 05:34   CT HEAD WO CONTRAST ( )  Result Date: 11/20/2022 CLINICAL DATA:  89 year old male with syncope. Hypertension, cirrhosis. EXAM: CT HEAD WITHOUT CONTRAST TECHNIQUE: Contiguous axial images were obtained from the base of the skull through the vertex without intravenous contrast. RADIATION DOSE REDUCTION: This exam was performed according to the departmental dose-optimization program which includes automated exposure control, adjustment of the mA and/or kV according to patient size and/or use of iterative reconstruction technique. COMPARISON:  Brain MRI 11/22/2012.  Head CT 08/07/2022. FINDINGS: Brain: Cerebral volume remains normal for age. No midline shift, ventriculomegaly, mass effect, evidence of mass lesion, intracranial hemorrhage or evidence of cortically based acute infarction. Patchy and confluent bilateral cerebral white matter hypodensity is stable along with gray-white  differentiation in both hemispheres. Vascular: Calcified atherosclerosis at the skull base. No suspicious intracranial vascular hyperdensity. Skull: Comminuted right facial fractures in September. No new osseous abnormality identified. Sinuses/Orbits: Improved appearance of right facial fractures and paranasal sinus hemorrhage since September. Mild residual sinus fluid and mucosal thickening now. Tympanic cavities and mastoids are  clear. Other: No acute orbit or scalp soft tissue finding. IMPRESSION: 1. No acute intracranial abnormality. Stable non contrast CT appearance of advanced white matter disease. 2. Improved paranasal sinus aeration following comminuted right facial fractures in September. Electronically Signed   By: Odessa Fleming M.D.   On: 11/20/2022 05:31   DG Chest Port 1 View  Result Date: 11/19/2022 CLINICAL DATA:  Shortness of breath EXAM: PORTABLE CHEST 1 VIEW COMPARISON:  08/07/2022 FINDINGS: Left chest wall ICD. Sternotomy and CABG. Stable mildly enlarged cardiomediastinal silhouette. Aortic atherosclerotic calcification. Similar moderate bilateral mid and lower lung interstitial thickening and likely represents chronic scarring/interstitial lung disease. Biapical pleural-parenchymal scarring. No pleural effusion or pneumothorax. IMPRESSION: Similar chronic bronchitic changes in the lower lungs. Electronically Signed   By: Minerva Fester M.D.   On: 11/19/2022 23:16   DG Shoulder Left  Result Date: 11/19/2022 CLINICAL DATA:  Left shoulder pain and limited range of motion EXAM: LEFT SHOULDER - 2+ VIEW COMPARISON:  None Available. FINDINGS: There is no evidence of fracture or dislocation. Degenerative arthritis AC joint. Soft tissues are unremarkable. IMPRESSION: No acute fracture or dislocation. Electronically Signed   By: Minerva Fester M.D.   On: 11/19/2022 23:14     PROCEDURES:  Critical Care performed: Yes, see critical care procedure note(s)   CRITICAL CARE Performed by: Baxter Hire  Marsella Suman   Total critical care time: 40 minutes  Critical care time was exclusive of separately billable procedures and treating other patients.  Critical care was necessary to treat or prevent imminent or life-threatening deterioration.  Critical care was time spent personally by me on the following activities: development of treatment plan with patient and/or surrogate as well as nursing, discussions with consultants, evaluation of patient's response to treatment, examination of patient, obtaining history from patient or surrogate, ordering and performing treatments and interventions, ordering and review of laboratory studies, ordering and review of radiographic studies, pulse oximetry and re-evaluation of patient's condition.   [name]1-3 Lead EKG Interpretation  Performed by: Amedio Bowlby, Layla Maw, DO Authorized by: Faatima Tench, Layla Maw, DO     Interpretation: normal     ECG rate:  70   ECG rate assessment: normal     Rhythm: paced     Ectopy: none     Conduction: normal       IMPRESSION / MDM / ASSESSMENT AND PLAN / ED COURSE  I reviewed the triage vital signs and the nursing notes.  Patient here with syncopal event, hypoxia.  The patient is on the cardiac monitor to evaluate for evidence of arrhythmia and/or significant heart rate changes.   DIFFERENTIAL DIAGNOSIS (includes but not limited to):   Viral URI, pneumonia, pneumothorax, CHF, ACS, PE, intracranial hemorrhage, skull fracture  Patient's presentation is most consistent with acute presentation with potential threat to life or bodily function.  PLAN: Workup initiated from triage.  Patient has hemoglobin of 9.5.  No leukocytosis.  Creatinine of 1.36.  Otherwise normal electrolytes.  Troponin x 2 negative.  D-dimer elevated at 0.93.  Patient is positive for COVID and influenza A.  Chest x-ray and left shoulder x-ray obtained from triage and reviewed and interpreted by myself and the radiologist and show no acute changes.  Will obtain CT  head, cervical spine and CTA of the chest.  Patient will need admission given his new onset hypoxia.   MEDICATIONS GIVEN IN ED: Medications  sodium chloride 0.9 % bolus 500 mL (500 mLs Intravenous New Bag/Given 11/20/22 0553)  iohexol (OMNIPAQUE) 350 MG/ML injection  75 mL (75 mLs Intravenous Contrast Given 11/20/22 0441)     ED COURSE: CT head and cervical spine reviewed and interpreted by myself and the radiologist and show no acute traumatic injury.  He has a mild T3 superior endplate compression fracture but has no spinal tenderness on exam.  I do not think this is acute from his fall today.  CTA of the chest shows no PE.  Will discuss with hospitalist for admission for flu, COVID, new onset hypoxia, syncope.   CONSULTS:  Consulted and discussed patient's case with hospitalist, Dr. Para March.  I have recommended admission and consulting physician agrees and will place admission orders.  Patient (and family if present) agree with this plan.   I reviewed all nursing notes, vitals, pertinent previous records.  All labs, EKGs, imaging ordered have been independently reviewed and interpreted by myself.    OUTSIDE RECORDS REVIEWED: Reviewed last office visit with internal medicine on 11/23/2017.       FINAL CLINICAL IMPRESSION(S) / ED DIAGNOSES   Final diagnoses:  COVID  Influenza A  Acute respiratory failure with hypoxia (HCC)  Syncope and collapse  Injury of head, initial encounter     Rx / DC Orders   ED Discharge Orders     None        Note:  This document was prepared using Dragon voice recognition software and may include unintentional dictation errors.   Jennea Rager, Layla Maw, DO 11/20/22 [phone]

## 2022-11-20 NOTE — TOC Initial Note (Addendum)
Transition of Care [facility]) - Initial/Assessment Note    Patient Details  Name: [name] Christensen MRN: [id number] Date of Birth: 03/08/1932  Transition of Care [facility]) CM/SW Contact:    Darolyn Rua, LCSW Phone Number: 11/20/2022, 2:07 PM  Clinical Narrative:                  CSW spoke with patient's spouse Kathie Rhodes regarding [facility] PT recommendations, she reports being in agreement with no preference of HH agency. Reports they already have a walker at home and no other dme needs.   CSW has sent out [facility] referrals pending acceptance at this time.   [place] with [facility] unable to accept Referral given to [facility] with [facility], unable to accept. CSW has asked Kandee Keen with [facility] unable to accept.  [facility] unable to accept [facility] unable to accpet  Expected Discharge Plan: Home w Home Health Services Barriers to Discharge: Continued Medical Work up   Patient Goals and CMS Choice Patient states their goals for this hospitalization and ongoing recovery are:: to go home CMS Medicare.gov Compare Post Acute Care list provided to:: Patient Choice offered to / list presented to : Patient      Expected Discharge Plan and Services       Living arrangements for the past 2 months: Single Family Home                                      Prior Living Arrangements/Services Living arrangements for the past 2 months: Single Family Home Lives with:: Spouse                   Activities of Daily Living      Permission Sought/Granted                  Emotional Assessment              Admission diagnosis:  Syncope [R55] Patient Active Problem List   Diagnosis Date Noted   Syncope 11/20/2022   Zygoma fracture (HCC) 08/19/2022   Concussion 08/19/2022   Oral bleeding 08/19/2022   Cardiac pacemaker in situ 04/02/2022   Chronic idiopathic constipation 04/02/2022   Heart failure (HCC) 04/02/2022   Problem related to unspecified psychosocial circumstances 04/02/2022    Sensorineural hearing loss, bilateral 04/02/2022   Vitamin B12 deficiency (non anemic) 04/02/2022   Upper airway cough syndrome 03/26/2022   Systolic and diastolic CHF, chronic (HCC) 03/26/2022   Anemia 09/25/2021   Osteoporosis 07/26/2021   Post-COVID chronic fatigue 07/26/2021   Diarrhea 07/03/2021   COVID-19 06/10/2021   Low blood pressure 06/10/2021   Aortic atherosclerosis (HCC) 04/02/2021   Apathy 04/02/2021   Memory problem 03/01/2021   Contusion of left shoulder 01/07/2021   Chronic renal insufficiency, stage 3 (moderate) (HCC) 01/01/2021   Persistent atrial fibrillation (HCC) 07/05/2019   Ischemic cardiomyopathy    Unstable angina (HCC)    Upper respiratory infection 09/23/2018   Hypocalciuric hypercalcemia 09/16/2018   Fall 08/17/2018   Facial pain 08/17/2018   Hematuria 05/20/2018   Paroxysmal atrial fibrillation (HCC)    Bronchiectasis (HCC) assoc with possible MAI 03/11/2018   Dyspnea on exertion 03/10/2018   Chronic diastolic CHF (congestive heart failure) (HCC) 03/09/2018   Atrial flutter (HCC) 03/09/2018   PVC's (premature ventricular contractions) 03/09/2018   Arthralgia 01/29/2018   Dry skin 12/22/2017   Carotid stenosis, asymptomatic, bilateral 12/22/2017   Chills with  fever 11/24/2017   Dysuria 11/24/2017   Anxiety disorder 11/24/2017   Hepatic cirrhosis (HCC) 06/05/2017   Chronic calculous cholecystitis 06/05/2017   Weight loss 04/24/2017   Sinus node dysfunction (HCC) 04/07/2017   Chronic cholecystitis s/p lap cholecystectomy 06/05/2017 02/24/2017   Pleural effusion 02/24/2017   Dyslipidemia 08/05/2016   Weakness 04/09/2016   Constipation 08/09/2015   Cough in adult 11/30/2014   LBP (low back pain) 08/30/2013   Chest pain, atypical 07/21/2013   Lung infiltrate 07/19/2013   Ventricular vs Supraventricular bigeminy 04/24/2013   Implantable cardioverter-defibrillator (ICD) in situ 10/25/2012   CAP (community acquired pneumonia) 23-Nov-2012   Sudden  cardiac death (HCC) 11/17/12   CORONARY ARTERY BYPASS GRAFT, HX OF 10/11/2008   Insomnia 12/07/2007   Essential hypertension 09/01/2007   CAD (coronary artery disease) 09/01/2007   GERD 09/01/2007   BPH (benign prostatic hyperplasia) 09/01/2007   PCP:  Tresa Garter, MD Pharmacy:   [facility] [place], [place] - [address] [place] [address] Phone: [phone] Fax: [phone]  [facility] [id number] [place], [place] - [address] [place] [address] Phone: [phone] Fax: [phone]  [facility] [place], [place] - [address] [place] [address] Phone: [phone] Fax: [phone]     Social Determinants of Health (SDOH) Social History: SDOH Screenings   Food Insecurity: No Food Insecurity (05/22/2022)  Housing: Low Risk  (05/22/2022)  Transportation Needs: No Transportation Needs (05/22/2022)  Alcohol Screen: Low Risk  (04/02/2022)  Depression (PHQ2-9): Low Risk  (07/03/2022)  Financial Resource Strain: Low Risk  (04/02/2022)  Physical Activity: Sufficiently Active (04/02/2022)  Social Connections: Socially Integrated (04/02/2022)  Stress: No Stress Concern Present (04/02/2022)  Tobacco Use: Low Risk  (11/19/2022)   SDOH Interventions:     Readmission Risk Interventions     No data to display

## 2022-11-20 NOTE — ED Provider Notes (Signed)
Patient seen by hospitalist on rounds this afternoon.  Stable for discharge and outpatient management.    Requesting discharge.   Merlyn Lot, MD 11/20/22 [phone]

## 2022-11-20 NOTE — Evaluation (Signed)
Occupational Therapy Evaluation Patient Details Name: [name] Christensen MRN: [id number] DOB: [birth year] Today's Date: 11/20/2022   History of Present Illness Pt brought in via ems from home with syncopal episode.  Hx cabg, pacemaker.  No chest pain or sob.   No n/v/d  pt alert  speech clear.  Pt has left shoulder pain   limited rom. X ray for shoulder negative for fracture.   Clinical Impression   Patient presenting with decreased independence in self care, balance, functional mobility/transfers, and endurance. Prior to admission pt lived with spouse, was IND for ADLs/IADLs, IND for functional mobility (occasional use of RW), and still driving. Pt currently functioning at Mod I for bed mobility, Mod I for STS without an AD, and Min guard for functional mobility via HHA. Pt completed seated LB dressing with set up-supervision. Anticipate Min guard for standing ADL tasks. Pt will benefit from acute OT to increase overall independence in the areas of ADLs and functional mobility in order to safely discharge home. Pt could benefit from [facility] following D/C to decrease falls risk, improve balance, and maximize independence in self-care within own home environment.      Recommendations for follow up therapy are one component of a multi-disciplinary discharge planning process, led by the attending physician.  Recommendations may be updated based on patient status, additional functional criteria and insurance authorization.   Follow Up Recommendations  Home health OT     Assistance Recommended at Discharge Intermittent Supervision/Assistance  Patient can return home with the following A little help with walking and/or transfers;A little help with bathing/dressing/bathroom;Assistance with cooking/housework;Assist for transportation;Help with stairs or ramp for entrance    Functional Status Assessment  Patient has had a recent decline in their functional status and demonstrates the ability to make  significant improvements in function in a reasonable and predictable amount of time.  Equipment Recommendations  None recommended by OT    Recommendations for Other Services       Precautions / Restrictions Precautions Precautions: Fall Restrictions Weight Bearing Restrictions: No      Mobility Bed Mobility Overal bed mobility: Modified Independent             General bed mobility comments: supine<>sit    Transfers Overall transfer level: Modified independent Equipment used: None                      Balance Overall balance assessment: Needs assistance, History of Falls Sitting-balance support: Feet supported Sitting balance-Leahy Scale: Good     Standing balance support: Single extremity supported, During functional activity Standing balance-Leahy Scale: Fair                             ADL either performed or assessed with clinical judgement   ADL Overall ADL's : Needs assistance/impaired             Lower Body Bathing: Set up;Supervison/ safety;Sitting/lateral leans Lower Body Bathing Details (indicate cue type and reason): socks         Toilet Transfer: Modified Independent Toilet Transfer Details (indicate cue type and reason): simulated with STS from stretcher, no AD         Functional mobility during ADLs: Min guard (at room level via HHA) General ADL Comments: anticipate Min guard for standing ADL tasks for safety     Vision Baseline Vision/History: 1 Wears glasses Patient Visual Report: No change from baseline  Perception     Praxis      Pertinent Vitals/Pain Pain Assessment Pain Assessment: Faces Faces Pain Scale: Hurts a little bit Pain Location: left shoulder, but states it is improved Pain Descriptors / Indicators: Discomfort, Sore Pain Intervention(s): Monitored during session, Repositioned     Hand Dominance Right   Extremity/Trunk Assessment Upper Extremity Assessment Upper Extremity  Assessment: Generalized weakness   Lower Extremity Assessment Lower Extremity Assessment: Generalized weakness   Cervical / Trunk Assessment Cervical / Trunk Assessment: Normal   Communication Communication Communication: No difficulties   Cognition Arousal/Alertness: Awake/alert Behavior During Therapy: WFL for tasks assessed/performed Overall Cognitive Status: Within Functional Limits for tasks assessed                                       General Comments       Exercises Other Exercises Other Exercises: OT provided education re: role of OT, OT POC, post acute recs, sitting up for all meals, EOB/OOB mobility with assistance, home/fall safety.     Shoulder Instructions      Home Living Family/patient expects to be discharged to:: Private residence Living Arrangements: Spouse/significant other Available Help at Discharge: Family;Available 24 hours/day Type of Home: House   Entrance Stairs-Number of Steps: 4 Entrance Stairs-Rails: Right Home Layout: Two level;Able to live on main level with bedroom/bathroom     Bathroom Shower/Tub: Hospital doctor Toilet: Handicapped height     Home Equipment: Conservation officer, nature (2 wheels);Shower seat - built in          Prior Functioning/Environment Prior Level of Function : Driving;Independent/Modified Independent;History of Falls (last six months)             Mobility Comments: uses walker when needed ADLs Comments: Independent, still driving, very active (walking several miles at the gym)        OT Problem List: Decreased strength;Decreased activity tolerance;Impaired balance (sitting and/or standing);Pain      OT Treatment/Interventions: Self-care/ADL training;Therapeutic exercise;Therapeutic activities;Energy conservation;DME and/or AE instruction;Patient/family education;Balance training    OT Goals(Current goals can be found in the care plan section) Acute Rehab OT Goals Patient Stated  Goal: return home OT Goal Formulation: With patient Time For Goal Achievement: 12/04/22 Potential to Achieve Goals: Good   OT Frequency: Min 2X/week    Co-evaluation              AM-PAC OT "6 Clicks" Daily Activity     Outcome Measure Help from another person eating meals?: None Help from another person taking care of personal grooming?: A Little Help from another person toileting, which includes using toliet, bedpan, or urinal?: A Little Help from another person bathing (including washing, rinsing, drying)?: A Little Help from another person to put on and taking off regular upper body clothing?: None Help from another person to put on and taking off regular lower body clothing?: A Little 6 Click Score: 20   End of Session Nurse Communication: Mobility status  Activity Tolerance: Patient tolerated treatment well Patient left: in bed;with call bell/phone within reach;with bed alarm set  OT Visit Diagnosis: Unsteadiness on feet (R26.81);Muscle weakness (generalized) (M62.81);History of falling (Z91.81);Pain Pain - Right/Left: Left Pain - part of body: Shoulder                Time: 1610-9604 OT Time Calculation (min): 20 min Charges:  OT General Charges $OT Visit: 1 Visit OT Evaluation $  OT Eval Low Complexity: 1 Low  [facility] MS, OTR/L ascom [phone]  11/20/22, 5:54 PM

## 2022-11-20 NOTE — Evaluation (Signed)
Physical Therapy Evaluation Patient Details Name: [name] Christensen MRN: [id number] DOB: 1932/01/14 Today's Date: 11/20/2022  History of Present Illness  Pt brought in via ems from home with syncopal episode.  Hx cabg, pacemaker.  No chest pain or sob.   No n/v/d  pt alert  speech clear.  Pt has left shoulder pain   limited rom. X ray for shoulder negative for fracture.  Clinical Impression  Patient received in bed, he is pleasant, HOH. Agrees to PT assessment. He is mod I with bed mobility. Min guard for transfers and ambulated in room ~30 feet with single light hand held assist. Patient does tend to catch toes on floor during ambulation. He is at increased risk of falls. Patient will benefit from continued skilled PT to improve strength, balance and safety with mobility.        Recommendations for follow up therapy are one component of a multi-disciplinary discharge planning process, led by the attending physician.  Recommendations may be updated based on patient status, additional functional criteria and insurance authorization.  Follow Up Recommendations Home health PT      Assistance Recommended at Discharge Frequent or constant Supervision/Assistance  Patient can return home with the following  A little help with walking and/or transfers;A little help with bathing/dressing/bathroom;Assist for transportation;Help with stairs or ramp for entrance    Equipment Recommendations None recommended by PT  Recommendations for Other Services       Functional Status Assessment Patient has had a recent decline in their functional status and demonstrates the ability to make significant improvements in function in a reasonable and predictable amount of time.     Precautions / Restrictions Precautions Precautions: Fall Restrictions Weight Bearing Restrictions: No      Mobility  Bed Mobility Overal bed mobility: Modified Independent             General bed mobility comments:  supervision only    Transfers Overall transfer level: Modified independent Equipment used: None                    Ambulation/Gait Ambulation/Gait assistance: Min guard Gait Distance (Feet): 30 Feet Assistive device: 1 person hand held assist Gait Pattern/deviations: Step-through pattern, Decreased step length - right, Decreased step length - left, Decreased dorsiflexion - left, Decreased dorsiflexion - right, Shuffle Gait velocity: decr     General Gait Details: patient occasionally catching toes with ambulation, benefitting from min guard with mobility. Cues needed for improved foot clearance. O2 sats down to 89% on room air with mobility. Quickly returned to > 90% with seated rest  Stairs            Wheelchair Mobility    Modified Rankin (Stroke Patients Only)       Balance Overall balance assessment: Needs assistance, History of Falls Sitting-balance support: Feet supported Sitting balance-Leahy Scale: Good     Standing balance support: Single extremity supported, During functional activity Standing balance-Leahy Scale: Fair Standing balance comment: increased risk of falls, requires use of RW with mobility                             Pertinent Vitals/Pain Pain Assessment Pain Assessment: Faces Faces Pain Scale: Hurts a little bit Pain Location: left shoulder, but states it is improved Pain Descriptors / Indicators: Discomfort, Sore Pain Intervention(s): Monitored during session, Repositioned    Home Living Family/patient expects to be discharged to:: Private residence Living Arrangements: Spouse/significant  other Available Help at Discharge: Family;Available 24 hours/day Type of Home: House Home Access: Stairs to enter Entrance Stairs-Rails: Right Entrance Stairs-Number of Steps: 4   Home Layout: Two level;Able to live on main level with bedroom/bathroom Home Equipment: Rolling Walker (2 wheels)      Prior Function Prior Level  of Function : Driving;Independent/Modified Independent;History of Falls (last six months)             Mobility Comments: uses walker when needed ADLs Comments: independent     Hand Dominance        Extremity/Trunk Assessment   Upper Extremity Assessment Upper Extremity Assessment: Defer to OT evaluation    Lower Extremity Assessment Lower Extremity Assessment: Generalized weakness    Cervical / Trunk Assessment Cervical / Trunk Assessment: Normal  Communication   Communication: No difficulties  Cognition Arousal/Alertness: Awake/alert Behavior During Therapy: WFL for tasks assessed/performed Overall Cognitive Status: Within Functional Limits for tasks assessed                                          General Comments      Exercises     Assessment/Plan    PT Assessment Patient needs continued PT services  PT Problem List Decreased strength;Decreased balance;Decreased mobility;Pain;Decreased safety awareness       PT Treatment Interventions DME instruction;Therapeutic exercise;Gait training;Balance training;Stair training;Neuromuscular re-education;Functional mobility training;Therapeutic activities;Patient/family education    PT Goals (Current goals can be found in the Care Plan section)  Acute Rehab PT Goals Patient Stated Goal: to return home with HHPT PT Goal Formulation: With patient Time For Goal Achievement: 12/04/22 Potential to Achieve Goals: Good    Frequency Min 2X/week     Co-evaluation               AM-PAC PT "6 Clicks" Mobility  Outcome Measure Help needed turning from your back to your side while in a flat bed without using bedrails?: None Help needed moving from lying on your back to sitting on the side of a flat bed without using bedrails?: None Help needed moving to and from a bed to a chair (including a wheelchair)?: A Little Help needed standing up from a chair using your arms (e.g., wheelchair or bedside  chair)?: A Little Help needed to walk in hospital room?: A Little Help needed climbing 3-5 steps with a railing? : A Little 6 Click Score: 20    End of Session Equipment Utilized During Treatment: Gait belt Activity Tolerance: Patient tolerated treatment well Patient left: in bed;with call bell/phone within reach Nurse Communication: Mobility status PT Visit Diagnosis: Unsteadiness on feet (R26.81);History of falling (Z91.81);Difficulty in walking, not elsewhere classified (R26.2);Muscle weakness (generalized) (M62.81)    Time: 6962-9528 PT Time Calculation (min) (ACUTE ONLY): 24 min   Charges:   PT Evaluation $PT Eval Moderate Complexity: 1 Mod PT Treatments $Gait Training: 8-22 mins        Ranie Chinchilla, PT, GCS 11/20/22,1:51 PM

## 2022-11-20 NOTE — Consult Note (Signed)
Medical Consultation   [name] Christensen  IRW:[id number]  DOB: 06/01/1932  DOA: 11/20/2022  PCP: Tresa Garter, MD    Requesting physician: Dr. Baxter Hire Ward  Reason for consultation: Respiratory failure, influenza A infection and COVID infection   History of Present Illness: [name] Christensen is an 88 y.o. male with PMH of mild dementia, previous cardiac arrest, PVC, combined CHF S/P AICD, SSS/PPM, CAD/CABG, persistent A-fib, low back pain, BPH, anxiety and liver cirrhosis presenting with 2 days of generalized weakness, poor p.o. intake and fall due to weakness.   History provided by patient and patient's son at bedside.  Patient denies syncope or passing out.  He had poor p.o. intake and generalized weakness over the last 2 days.  He slowly went down on his left side and hit his head and his left shoulder.  He had left arm pain since fall.  He has some chronic cough which seems to be getting productive since he came to ED.  He denies shortness of breath, chest pain, dizziness, fever, chills, nausea, vomiting, abdominal pain or UTI symptoms.  He denies focal neurosymptoms.  Per patient's son, he is usually active and walks about 2 miles a day.  Patient is vaccinated for COVID x 4.  He also had his flu vaccine this year.  Patient lives with family.  Independently ambulates at baseline.  Denies smoking cigarette, drinking alcohol recreational drug use.  While in ED, briefly desaturated to 88% and started on 2 L and he recovered to 100%.  COVID-19 and influenza PCR positive.  EKG features atrial paced rhythm.  CTA chest with atelectasis and features consistent for MAI (chronic).  CT head and cervical spine without acute finding.  Left shoulder and left humerus x-ray without fracture or dislocation but degenerative changes.  On my examination, patient was saturating at 100% on room air lying in bed.  He is awake and oriented x 4.  Patient is eager to go home.    Review of  Systems:  ROS All review of system negative except for pertinent positives and negatives as history of present illness above.  Past Medical History: Past Medical History:  Diagnosis Date   Actinic keratosis 06/09/2019   L sup forehead at hairline - bx proven   AICD (automatic cardioverter/defibrillator) present 04/07/2017   Allergy    Anxiety    BPH (benign prostatic hypertrophy)    elevated PSA Dr. Lindell Noe Bx 2010   Cardiac arrest [facility])    a. out-of-hospital arrest 10/27/2012 - EF 40-45%, patent grafts on cath, received [employer] AICD.   Carotid disease, bilateral (HCC)    a. 0-39% by doppers.   Cataract    bil cataracts removed   CHF (congestive heart failure) (HCC)    Coronary artery disease    a. s/p MI/CABG 2005. b. s/p cath at time of VF arrest 10/2013 - grafts patent.   Cough    Diverticulosis    Elevated LFTs    a. 10/2012 felt due to cardiac arrest - Hepatitis C Ab reactive from 11/13/2012>>Hep C RNA PCR negative 11/15/2012.    GERD (gastroesophageal reflux disease)    Hyperlipidemia    Hypertension    Internal hemorrhoids    Myocardial infarction (HCC)    2005 - CABG x 5 2005   RBBB    Tubular adenoma of colon    Ventricular bigeminy    a. Event monitor 01/2013:  NSR with PVCs and occ bigeminy.    Past Surgical History: Past Surgical History:  Procedure Laterality Date   APPENDECTOMY     CARDIAC CATHETERIZATION  2007   with patent graft anatomy atretic left internal mammary  artery to the LAD which is nonobstructive. Will restart study June 08, 2007   CARDIOVERSION N/A 03/27/2018   Procedure: CARDIOVERSION;  Surgeon: Iran Ouch, MD;  Location: [facility] ORS;  Service: Cardiovascular;  Laterality: N/A;   CARDIOVERSION N/A 01/04/2019   Procedure: CARDIOVERSION (CATH LAB);  Surgeon: Iran Ouch, MD;  Location: [facility] ORS;  Service: Cardiovascular;  Laterality: N/A;   CARDIOVERSION N/A 07/07/2019   Procedure: CARDIOVERSION;  Surgeon: Wendall Stade, MD;  Location:  [facility] ENDOSCOPY;  Service: Cardiovascular;  Laterality: N/A;   CATARACT EXTRACTION W/PHACO Right 04/23/2016   Procedure: CATARACT EXTRACTION PHACO AND INTRAOCULAR LENS PLACEMENT (IOC);  Surgeon: [name], MD;  Location: [facility] ORS;  Service: Ophthalmology;  Laterality: Right;  [place] 1.09 AP% 19.3 CDE 13.38 Fluid pack lot # [id number]   CHOLECYSTECTOMY N/A 06/05/2017   Procedure: LAPAROSCOPIC CHOLECYSTECTOMY WITH INTRAOPERATIVE CHOLANGIOGRAM ERAS PATHWAY POSSIBLE NEEDLE CORE BIOPSY OF LIVER;  Surgeon: [name], MD;  Location: [facility] OR;  Service: General;  Laterality: N/A;  ERAS PATHWAY   COLONOSCOPY     CORONARY ARTERY BYPASS GRAFT  2005   EYE SURGERY     ICD GENERATOR CHANGEOUT N/A 04/07/2017   Procedure: ICD Generator Changeout;  Surgeon: Duke Salvia, MD;  Location: [facility] INVASIVE CV LAB;  Service: Cardiovascular;  Laterality: N/A;   IMPLANTABLE CARDIOVERTER DEFIBRILLATOR IMPLANT N/A 10/29/2012   STJ single chamber ICD implanted by Dr Graciela Husbands for cardiac arrest    INGUINAL HERNIA REPAIR Bilateral 01/17/2015   Procedure: BILATERAL LAPAROSCOPIC INGUINAL HERNIA REPAIR WITH LEFT FEMORAL HERNIA REPAIR;  Surgeon: [name], MD;  Location: [facility] OR;  Service: General;  Laterality: Bilateral;   INSERTION OF MESH Bilateral 01/17/2015   Procedure: INSERTION OF MESH;  Surgeon: [name], MD;  Location: [facility] OR;  Service: General;  Laterality: Bilateral;   LAPAROSCOPIC CHOLECYSTECTOMY  06/05/2017   LEAD INSERTION N/A 04/07/2017   Procedure: RA Lead Insertion;  Surgeon: Duke Salvia, MD;  Location: [facility] INVASIVE CV LAB;  Service: Cardiovascular;  Laterality: N/A;   LEAD REVISION/REPAIR N/A 04/08/2017   Procedure: Atrial Lead Revision/Repair;  Surgeon: Duke Salvia, MD;  Location: [facility] INVASIVE CV LAB;  Service: Cardiovascular;  Laterality: N/A;   LEFT HEART CATH AND CORONARY ANGIOGRAPHY Left 05/14/2019   Procedure: LEFT HEART CATH AND CORONARY ANGIOGRAPHY;  Surgeon: Iran Ouch, MD;  Location: [facility]  INVASIVE CV LAB;  Service: Cardiovascular;  Laterality: Left;   LEFT HEART CATHETERIZATION WITH CORONARY/GRAFT ANGIOGRAM N/A 11/21/2012   Procedure: LEFT HEART CATHETERIZATION WITH Isabel Caprice;  Surgeon: [name], MD;  Location: [facility] CATH LAB;  Service: Cardiovascular;  Laterality: N/A;   LIVER BIOPSY N/A 06/05/2017   Procedure: SINGLE SITE LIVER BIOPSY ERAS PATHWAY;  Surgeon: [name], MD;  Location: [facility] OR;  Service: General;  Laterality: N/A;  ERAS PATHWAY   LUMBAR FUSION  09/2007     Allergies:   Allergies  Allergen Reactions   Ezetimibe Other (See Comments)    Pt was in coma for 6 days, numbness   Penicillins Other (See Comments)    BLISTERS BETWEEN FINGERS Did it involve swelling of the face/tongue/throat, SOB, or low BP? No Did it involve sudden or severe rash/hives, skin peeling, or any reaction on the inside of your mouth  or nose? Yes Did you need to seek medical attention at a hospital or doctor's office? No When did it last happen?      60 years If all above answers are "NO", may proceed with cephalosporin use.    Pravastatin Sodium Other (See Comments)    Aches and pains in joints   Rosuvastatin Other (See Comments)    MYALGIAS   Niacin Anxiety and Other (See Comments)    "Makes me feel nervous, jittery"     Social History:  reports that he has never smoked. He has been exposed to tobacco smoke. He has never used smokeless tobacco. He reports that he does not drink alcohol and does not use drugs.   Family History: Family History  Problem Relation Age of Onset   Coronary artery disease Mother    Heart attack Brother    Stomach cancer Neg Hx    Colon cancer Neg Hx    Esophageal cancer Neg Hx    Pancreatic cancer Neg Hx    Prostate cancer Neg Hx    Rectal cancer Neg Hx    Hypercalcemia Neg Hx     Physical Exam: Vitals:   11/19/22 2234 11/20/22 0258 11/20/22 0900 11/20/22 1046  BP: (!) 120/57 (!) 96/57 (!) 150/65   Pulse: 70 70 70    Resp: 20 16 17    Temp: 99.3 F (37.4 C) 98.3 F (36.8 C)  98.2 F (36.8 C)  TempSrc: Oral Oral  Oral  SpO2: (!) 88% 98% 100%   Weight: 59 kg     Height: 5\' 7"  (1.702 m)       Constitutional - resting comfortably, no acute distress Eyes - vision grossly intact. Sclera anicteric. PERRL.  Nose - no gross deformity or drainage Mouth - no oral lesions noted Throat - no swelling or erythema Endo - no obvious thyromegaly CV -. (+)S1S2, no murmurs; no JVD or peripheral edema Resp - No increased work of breathing, good aeration bilaterally, no wheeze or crackles GI - (+)BS, soft, non-tender, non-distended MSK - normal range of motion, no obvious deformity Skin - no obvious rashes or lesions Neuro - alert, aware, oriented to person/place/time. No gross deficit  Psych - calm, normal mood and affect    Data reviewed:  I have personally reviewed following labs and imaging studies Labs:  CBC: Recent Labs  Lab 11/19/22 2243  WBC 7.6  HGB 9.5*  HCT 29.6*  MCV 93.4  PLT 136*    Basic Metabolic Panel: Recent Labs  Lab 11/19/22 2243  NA 131*  K 4.5  CL 101  CO2 24  GLUCOSE 116*  BUN 22  CREATININE 1.36*  CALCIUM 9.9   GFR Estimated Creatinine Clearance: 31.3 mL/min (A) (by C-G formula based on SCr of 1.36 mg/dL (H)). Liver Function Tests: No results for input(s): "AST", "ALT", "ALKPHOS", "BILITOT", "PROT", "ALBUMIN" in the last 168 hours. No results for input(s): "LIPASE", "AMYLASE" in the last 168 hours. No results for input(s): "AMMONIA" in the last 168 hours. Coagulation profile No results for input(s): "INR", "PROTIME" in the last 168 hours.  Cardiac Enzymes: No results for input(s): "CKTOTAL", "CKMB", "CKMBINDEX", "TROPONINI" in the last 168 hours. BNP: Invalid input(s): "POCBNP" CBG: No results for input(s): "GLUCAP" in the last 168 hours. D-Dimer Recent Labs    11/20/22 0255  DDIMER 0.93*   Hgb A1c No results for input(s): "HGBA1C" in the last 72  hours. Lipid Profile No results for input(s): "CHOL", "HDL", "LDLCALC", "TRIG", "CHOLHDL", "LDLDIRECT" in the  last 72 hours. Thyroid function studies No results for input(s): "TSH", "T4TOTAL", "T3FREE", "THYROIDAB" in the last 72 hours.  Invalid input(s): "FREET3" Anemia work up No results for input(s): "VITAMINB12", "FOLATE", "FERRITIN", "TIBC", "IRON", "RETICCTPCT" in the last 72 hours. Urinalysis    Component Value Date/Time   COLORURINE YELLOW 09/30/2022 1049   APPEARANCEUR CLEAR 09/30/2022 1049   LABSPEC 1.010 09/30/2022 1049   PHURINE 6.0 09/30/2022 1049   GLUCOSEU NEGATIVE 09/30/2022 1049   HGBUR NEGATIVE 09/30/2022 1049   BILIRUBINUR NEGATIVE 09/30/2022 1049   BILIRUBINUR Negative 11/30/2014 1520   KETONESUR NEGATIVE 09/30/2022 1049   PROTEINUR >300 (A) 05/28/2015 0048   UROBILINOGEN 0.2 09/30/2022 1049   NITRITE NEGATIVE 09/30/2022 1049   LEUKOCYTESUR NEGATIVE 09/30/2022 1049     Microbiology Recent Results (from the past 240 hour(s))  Resp panel by RT-PCR (RSV, Flu A&B, Covid) Anterior Nasal Swab     Status: Abnormal   Collection Time: 11/20/22  2:55 AM   Specimen: Anterior Nasal Swab  Result Value Ref Range Status   SARS Coronavirus 2 by RT PCR POSITIVE (A) NEGATIVE Final    Comment: (NOTE) SARS-CoV-2 target nucleic acids are DETECTED.  The SARS-CoV-2 RNA is generally detectable in upper respiratory specimens during the acute phase of infection. Positive results are indicative of the presence of the identified virus, but do not rule out bacterial infection or co-infection with other pathogens not detected by the test. Clinical correlation with patient history and other diagnostic information is necessary to determine patient infection status. The expected result is Negative.  Fact Sheet for Patients: BloggerCourse.com  Fact Sheet for Healthcare Providers: SeriousBroker.it  This test is not yet approved or  cleared by the [place] FDA and  has been authorized for detection and/or diagnosis of SARS-CoV-2 by FDA under an Emergency Use Authorization (EUA).  This EUA will remain in effect (meaning this test can be used) for the duration of  the COVID-19 declaration under Section 564(b)(1) of the A ct, 21 U.S.C. section 360bbb-3(b)(1), unless the authorization is terminated or revoked sooner.     Influenza A by PCR POSITIVE (A) NEGATIVE Final   Influenza B by PCR NEGATIVE NEGATIVE Final    Comment: (NOTE) The Xpert Xpress SARS-CoV-2/FLU/RSV plus assay is intended as an aid in the diagnosis of influenza from Nasopharyngeal swab specimens and should not be used as a sole basis for treatment. Nasal washings and aspirates are unacceptable for Xpert Xpress SARS-CoV-2/FLU/RSV testing.  Fact Sheet for Patients: BloggerCourse.com  Fact Sheet for Healthcare Providers: SeriousBroker.it  This test is not yet approved or cleared by the [place] FDA and has been authorized for detection and/or diagnosis of SARS-CoV-2 by FDA under an Emergency Use Authorization (EUA). This EUA will remain in effect (meaning this test can be used) for the duration of the COVID-19 declaration under Section 564(b)(1) of the Act, 21 U.S.C. section 360bbb-3(b)(1), unless the authorization is terminated or revoked.     Resp Syncytial Virus by PCR NEGATIVE NEGATIVE Final    Comment: (NOTE) Fact Sheet for Patients: BloggerCourse.com  Fact Sheet for Healthcare Providers: SeriousBroker.it  This test is not yet approved or cleared by the [place] FDA and has been authorized for detection and/or diagnosis of SARS-CoV-2 by FDA under an Emergency Use Authorization (EUA). This EUA will remain in effect (meaning this test can be used) for the duration of the COVID-19 declaration under Section 564(b)(1) of the Act,  21 U.S.C. section 360bbb-3(b)(1), unless the authorization is terminated or revoked.  Performed at [facility], [address] [place], [place] [address]      Inpatient Medications:   Scheduled Meds:  alfuzosin  10 mg Oral QPM   apixaban  5 mg Oral BID   atorvastatin  20 mg Oral Daily   dofetilide  250 mcg Oral BID   donepezil  5 mg Oral QHS   dutasteride  0.5 mg Oral QPM   escitalopram  10 mg Oral QHS   guaiFENesin  600 mg Oral UD   metoprolol succinate  50 mg Oral Daily   multivitamin with minerals  1 tablet Oral QODAY   oseltamivir  30 mg Oral BID   pantoprazole  40 mg Oral Daily   Plecanatide  3 mg Oral Daily   Continuous Infusions:   Radiological Exams on Admission: DG Humerus Left  Result Date: 11/20/2022 CLINICAL DATA:  Left humeral pain. EXAM: LEFT HUMERUS - 2+ VIEW COMPARISON:  None Available. FINDINGS: No acute osseous abnormality. Degenerative changes in the left acromioclavicular joint. IMPRESSION: No acute findings. Electronically Signed   By: Leanna Battles M.D.   On: 11/20/2022 10:18   CT Angio Chest PE W and/or Wo Contrast  Result Date: 11/20/2022 CLINICAL DATA:  88 year old male with history of positive D-dimer presenting with syncopal episode. Evaluate for potential pulmonary embolism. EXAM: CT ANGIOGRAPHY CHEST WITH CONTRAST TECHNIQUE: Multidetector CT imaging of the chest was performed using the standard protocol during bolus administration of intravenous contrast. Multiplanar CT image reconstructions and MIPs were obtained to evaluate the vascular anatomy. RADIATION DOSE REDUCTION: This exam was performed according to the departmental dose-optimization program which includes automated exposure control, adjustment of the mA and/or kV according to patient size and/or use of iterative reconstruction technique. CONTRAST:  75mL OMNIPAQUE IOHEXOL 350 MG/ML SOLN COMPARISON:  High-resolution chest CT 05/06/2022. FINDINGS: Cardiovascular: No filling  defects within the pulmonary arterial tree to suggest pulmonary embolism. Heart size is borderline enlarged. There is no significant pericardial fluid, thickening or pericardial calcification. There is aortic atherosclerosis, as well as atherosclerosis of the great vessels of the mediastinum and the coronary arteries, including calcified atherosclerotic plaque in the left main, left anterior descending, left circumflex and right coronary arteries. Status post median sternotomy for CABG including LIMA to the LAD. Left-sided pacemaker device in place with lead tips terminating in the right atrial appendage and right ventricular apex. Calcifications of the aortic valve. Mediastinum/Nodes: No pathologically enlarged mediastinal or hilar lymph nodes. Several prominent but nonenlarged lymph nodes are clustered in the right hilar nodal station, several of which are partially calcified, nonspecific. Esophagus is unremarkable in appearance. No axillary lymphadenopathy. Lungs/Pleura: Again noted are scattered areas of cylindrical and mild varicose bronchiectasis, peripheral bronchiolectasis, severe thickening of the peribronchovascular interstitium, peribronchovascular micro and macronodularity as well as regional areas of architectural distortion and chronic scarring, similar to the prior study from 05/06/2022. No new focal airspace consolidation. Trace right pleural effusion. No left pleural effusion. Upper Abdomen: Aortic atherosclerosis. Status post cholecystectomy. Small calcified granuloma noted in the liver. Musculoskeletal: Median sternotomy wires. There are no aggressive appearing lytic or blastic lesions noted in the visualized portions of the skeleton. Review of the MIP images confirms the above findings. IMPRESSION: 1. No evidence of pulmonary embolism. 2. The appearance of the lungs is once again most suggestive of a chronic indolent atypical infectious process such as MAI (mycobacterium avium intracellulare), as  detailed above. 3. Trace right pleural effusion. 4. Aortic atherosclerosis, in addition to left main and three-vessel coronary artery  disease. Status post median sternotomy for CABG including LIMA to the LAD. Aortic Atherosclerosis (ICD10-I70.0). Electronically Signed   By: Trudie Reed M.D.   On: 11/20/2022 06:11   CT Cervical Spine Wo Contrast  Result Date: 11/20/2022 CLINICAL DATA:  88 year old male with syncope. Hypertension, cirrhosis. EXAM: CT CERVICAL SPINE WITHOUT CONTRAST TECHNIQUE: Multidetector CT imaging of the cervical spine was performed without intravenous contrast. Multiplanar CT image reconstructions were also generated. RADIATION DOSE REDUCTION: This exam was performed according to the departmental dose-optimization program which includes automated exposure control, adjustment of the mA and/or kV according to patient size and/or use of iterative reconstruction technique. COMPARISON:  Head CT today.  Cervical spine CT 08/07/2022. FINDINGS: Alignment: Stable cervical lordosis. Cervicothoracic junction alignment is within normal limits. Bilateral posterior element alignment is within normal limits. Skull base and vertebrae: Visualized skull base is intact. No atlanto-occipital dissociation. Stable C1-C2 alignment. No acute osseous abnormality identified. Congenital incomplete ossification of the posterior C1 ring is stable. Soft tissues and spinal canal: No prevertebral fluid or swelling. No visible canal hematoma. Calcified cervical carotid atherosclerosis. Stable noncontrast visible neck soft tissues. Disc levels: Stable cervical spine degeneration, most pronounced C1-C2 through C4-C5. Upper chest: Mild T3 superior endplate compression is new since September, age indeterminate. Other visible upper thoracic levels appear stable and intact. Negative lung apices. Chronic left chest cardiac pacemaker type device. IMPRESSION: 1. No acute traumatic injury identified in the cervical spine. Stable  cervical spine degeneration. 2. But a mild T3 superior endplate compression fracture is new since September, age indeterminate. Electronically Signed   By: Odessa Fleming M.D.   On: 11/20/2022 05:34   CT HEAD WO CONTRAST ( )  Result Date: 11/20/2022 CLINICAL DATA:  88 year old male with syncope. Hypertension, cirrhosis. EXAM: CT HEAD WITHOUT CONTRAST TECHNIQUE: Contiguous axial images were obtained from the base of the skull through the vertex without intravenous contrast. RADIATION DOSE REDUCTION: This exam was performed according to the departmental dose-optimization program which includes automated exposure control, adjustment of the mA and/or kV according to patient size and/or use of iterative reconstruction technique. COMPARISON:  Brain MRI 10/25/2012.  Head CT 08/07/2022. FINDINGS: Brain: Cerebral volume remains normal for age. No midline shift, ventriculomegaly, mass effect, evidence of mass lesion, intracranial hemorrhage or evidence of cortically based acute infarction. Patchy and confluent bilateral cerebral white matter hypodensity is stable along with gray-white differentiation in both hemispheres. Vascular: Calcified atherosclerosis at the skull base. No suspicious intracranial vascular hyperdensity. Skull: Comminuted right facial fractures in September. No new osseous abnormality identified. Sinuses/Orbits: Improved appearance of right facial fractures and paranasal sinus hemorrhage since September. Mild residual sinus fluid and mucosal thickening now. Tympanic cavities and mastoids are clear. Other: No acute orbit or scalp soft tissue finding. IMPRESSION: 1. No acute intracranial abnormality. Stable non contrast CT appearance of advanced white matter disease. 2. Improved paranasal sinus aeration following comminuted right facial fractures in September. Electronically Signed   By: Odessa Fleming M.D.   On: 11/20/2022 05:31   DG Chest Port 1 View  Result Date: 11/19/2022 CLINICAL DATA:  Shortness of  breath EXAM: PORTABLE CHEST 1 VIEW COMPARISON:  08/07/2022 FINDINGS: Left chest wall ICD. Sternotomy and CABG. Stable mildly enlarged cardiomediastinal silhouette. Aortic atherosclerotic calcification. Similar moderate bilateral mid and lower lung interstitial thickening and likely represents chronic scarring/interstitial lung disease. Biapical pleural-parenchymal scarring. No pleural effusion or pneumothorax. IMPRESSION: Similar chronic bronchitic changes in the lower lungs. Electronically Signed   By: Minerva Fester M.D.   On:  11/19/2022 23:16   DG Shoulder Left  Result Date: 11/19/2022 CLINICAL DATA:  Left shoulder pain and limited range of motion EXAM: LEFT SHOULDER - 2+ VIEW COMPARISON:  None Available. FINDINGS: There is no evidence of fracture or dislocation. Degenerative arthritis AC joint. Soft tissues are unremarkable. IMPRESSION: No acute fracture or dislocation. Electronically Signed   By: Minerva Fester M.D.   On: 11/19/2022 23:14    Impression/Recommendations Brief hypoxia/influenza infection/COVID-19 infection: No significant respiratory distress or symptoms.  Saturating at 100% on room air.  Vaccinated for COVID-19 x 4.  Had influenza vaccine this year.  CTA chest without acute finding.  -Can be discharged on p.o. Tamiflu 30 mg twice daily -No indication for COVID-19 treatment. -Encourage good hydration and holding ARB for 3 to 4 days. -Recorded discussed with patient and patient's son at bedside.  Generalized weakness/fall at home/left arm pain: Patient denies syncope or passing out.  In the setting of poor p.o. intake due to COVID-19 infection and influenza infection.  CT head and cervical spine without acute finding.  Left shoulder and left arm x-ray without acute finding. -HH PT/OT ordered as recommended -Tylenol as needed for pain -May use Voltaren gel as well.  Other chronic medical conditions stable   Thank you for this consultation.  Patient can be discharged from  ED.   Time Spent: 55 minutes with more than 50% spent in reviewing records, counseling patient/family and coordinating care.  Almon Hercules M.D. [facility] 11/20/2022, 1:22 PM

## 2022-11-21 ENCOUNTER — Other Ambulatory Visit: Payer: Self-pay | Admitting: Internal Medicine

## 2022-11-21 NOTE — Telephone Encounter (Signed)
Notified pt/wife w/MD response../lmb 

## 2022-12-12 ENCOUNTER — Other Ambulatory Visit: Payer: Self-pay | Admitting: Internal Medicine

## 2022-12-26 ENCOUNTER — Emergency Department: Payer: PPO

## 2022-12-26 ENCOUNTER — Encounter: Payer: Self-pay | Admitting: *Deleted

## 2022-12-26 ENCOUNTER — Other Ambulatory Visit: Payer: Self-pay

## 2022-12-26 ENCOUNTER — Inpatient Hospital Stay
Admission: EM | Admit: 2022-12-26 | Discharge: 2022-12-31 | DRG: 200 | Disposition: A | Discharge status: Home Health | Payer: PPO | Attending: Student | Admitting: Student

## 2022-12-26 DIAGNOSIS — F0393 Unspecified dementia, unspecified severity, with mood disturbance: Secondary | ICD-10-CM | POA: Diagnosis present

## 2022-12-26 DIAGNOSIS — I110 Hypertensive heart disease with heart failure: Secondary | ICD-10-CM | POA: Diagnosis present

## 2022-12-26 DIAGNOSIS — I480 Paroxysmal atrial fibrillation: Secondary | ICD-10-CM | POA: Diagnosis not present

## 2022-12-26 DIAGNOSIS — I252 Old myocardial infarction: Secondary | ICD-10-CM | POA: Diagnosis not present

## 2022-12-26 DIAGNOSIS — Z95810 Presence of automatic (implantable) cardiac defibrillator: Secondary | ICD-10-CM

## 2022-12-26 DIAGNOSIS — S0990XA Unspecified injury of head, initial encounter: Secondary | ICD-10-CM

## 2022-12-26 DIAGNOSIS — D509 Iron deficiency anemia, unspecified: Secondary | ICD-10-CM | POA: Diagnosis present

## 2022-12-26 DIAGNOSIS — J939 Pneumothorax, unspecified: Secondary | ICD-10-CM | POA: Diagnosis present

## 2022-12-26 DIAGNOSIS — F32A Depression, unspecified: Secondary | ICD-10-CM | POA: Diagnosis present

## 2022-12-26 DIAGNOSIS — S0083XA Contusion of other part of head, initial encounter: Secondary | ICD-10-CM | POA: Diagnosis not present

## 2022-12-26 DIAGNOSIS — Y838 Other surgical procedures as the cause of abnormal reaction of the patient, or of later complication, without mention of misadventure at the time of the procedure: Secondary | ICD-10-CM | POA: Diagnosis not present

## 2022-12-26 DIAGNOSIS — Z880 Allergy status to penicillin: Secondary | ICD-10-CM | POA: Diagnosis not present

## 2022-12-26 DIAGNOSIS — S2241XA Multiple fractures of ribs, right side, initial encounter for closed fracture: Secondary | ICD-10-CM | POA: Diagnosis not present

## 2022-12-26 DIAGNOSIS — Z8616 Personal history of COVID-19: Secondary | ICD-10-CM

## 2022-12-26 DIAGNOSIS — Z8674 Personal history of sudden cardiac arrest: Secondary | ICD-10-CM | POA: Diagnosis not present

## 2022-12-26 DIAGNOSIS — Z7901 Long term (current) use of anticoagulants: Secondary | ICD-10-CM

## 2022-12-26 DIAGNOSIS — Z888 Allergy status to other drugs, medicaments and biological substances status: Secondary | ICD-10-CM

## 2022-12-26 DIAGNOSIS — Z961 Presence of intraocular lens: Secondary | ICD-10-CM | POA: Diagnosis present

## 2022-12-26 DIAGNOSIS — S270XXA Traumatic pneumothorax, initial encounter: Secondary | ICD-10-CM | POA: Diagnosis not present

## 2022-12-26 DIAGNOSIS — I5042 Chronic combined systolic (congestive) and diastolic (congestive) heart failure: Secondary | ICD-10-CM | POA: Diagnosis not present

## 2022-12-26 DIAGNOSIS — S0003XA Contusion of scalp, initial encounter: Secondary | ICD-10-CM | POA: Diagnosis present

## 2022-12-26 DIAGNOSIS — W010XXA Fall on same level from slipping, tripping and stumbling without subsequent striking against object, initial encounter: Secondary | ICD-10-CM | POA: Diagnosis present

## 2022-12-26 DIAGNOSIS — Z8249 Family history of ischemic heart disease and other diseases of the circulatory system: Secondary | ICD-10-CM | POA: Diagnosis not present

## 2022-12-26 DIAGNOSIS — Z951 Presence of aortocoronary bypass graft: Secondary | ICD-10-CM | POA: Diagnosis not present

## 2022-12-26 DIAGNOSIS — I700 Atherosclerosis of aorta: Secondary | ICD-10-CM | POA: Diagnosis not present

## 2022-12-26 DIAGNOSIS — W19XXXA Unspecified fall, initial encounter: Secondary | ICD-10-CM | POA: Diagnosis present

## 2022-12-26 DIAGNOSIS — Z79899 Other long term (current) drug therapy: Secondary | ICD-10-CM

## 2022-12-26 DIAGNOSIS — J9811 Atelectasis: Secondary | ICD-10-CM | POA: Diagnosis not present

## 2022-12-26 DIAGNOSIS — T85628A Displacement of other specified internal prosthetic devices, implants and grafts, initial encounter: Secondary | ICD-10-CM | POA: Diagnosis not present

## 2022-12-26 DIAGNOSIS — J984 Other disorders of lung: Secondary | ICD-10-CM | POA: Diagnosis not present

## 2022-12-26 DIAGNOSIS — Z043 Encounter for examination and observation following other accident: Secondary | ICD-10-CM | POA: Diagnosis not present

## 2022-12-26 DIAGNOSIS — Z9841 Cataract extraction status, right eye: Secondary | ICD-10-CM

## 2022-12-26 DIAGNOSIS — D649 Anemia, unspecified: Secondary | ICD-10-CM | POA: Diagnosis present

## 2022-12-26 DIAGNOSIS — I2510 Atherosclerotic heart disease of native coronary artery without angina pectoris: Secondary | ICD-10-CM | POA: Diagnosis not present

## 2022-12-26 DIAGNOSIS — N400 Enlarged prostate without lower urinary tract symptoms: Secondary | ICD-10-CM | POA: Diagnosis present

## 2022-12-26 DIAGNOSIS — I4819 Other persistent atrial fibrillation: Secondary | ICD-10-CM | POA: Diagnosis present

## 2022-12-26 DIAGNOSIS — R42 Dizziness and giddiness: Secondary | ICD-10-CM

## 2022-12-26 DIAGNOSIS — Z950 Presence of cardiac pacemaker: Secondary | ICD-10-CM | POA: Diagnosis present

## 2022-12-26 DIAGNOSIS — E871 Hypo-osmolality and hyponatremia: Secondary | ICD-10-CM | POA: Diagnosis not present

## 2022-12-26 DIAGNOSIS — R079 Chest pain, unspecified: Secondary | ICD-10-CM | POA: Diagnosis not present

## 2022-12-26 DIAGNOSIS — Z9049 Acquired absence of other specified parts of digestive tract: Secondary | ICD-10-CM

## 2022-12-26 DIAGNOSIS — S270XXD Traumatic pneumothorax, subsequent encounter: Secondary | ICD-10-CM

## 2022-12-26 DIAGNOSIS — Z981 Arthrodesis status: Secondary | ICD-10-CM

## 2022-12-26 DIAGNOSIS — Z4682 Encounter for fitting and adjustment of non-vascular catheter: Secondary | ICD-10-CM | POA: Diagnosis not present

## 2022-12-26 DIAGNOSIS — M47812 Spondylosis without myelopathy or radiculopathy, cervical region: Secondary | ICD-10-CM | POA: Diagnosis not present

## 2022-12-26 DIAGNOSIS — J439 Emphysema, unspecified: Secondary | ICD-10-CM | POA: Diagnosis not present

## 2022-12-26 HISTORY — DX: Unstable angina: I20.0

## 2022-12-26 HISTORY — DX: Paroxysmal atrial fibrillation: I48.0

## 2022-12-26 LAB — BASIC METABOLIC PANEL
Anion gap: 6 (ref 5–15)
BUN: 23 mg/dL (ref 8–23)
CO2: 26 mmol/L (ref 22–32)
Calcium: 10.7 mg/dL — ABNORMAL HIGH (ref 8.9–10.3)
Chloride: 100 mmol/L (ref 98–111)
Creatinine, Ser: 1.13 mg/dL (ref 0.61–1.24)
GFR, Estimated: >60 mL/min (ref >60)
Glucose, Bld: 114 mg/dL — ABNORMAL HIGH (ref 70–99)
Potassium: 4.4 mmol/L (ref 3.5–5.1)
Sodium: 132 mmol/L — ABNORMAL LOW (ref 135–145)

## 2022-12-26 LAB — CBC
HCT: 33.9 % — ABNORMAL LOW (ref 39.0–52.0)
Hemoglobin: 10.8 g/dL — ABNORMAL LOW (ref 13.0–17.0)
MCH: 29.9 pg (ref 26.0–34.0)
MCHC: 31.9 g/dL (ref 30.0–36.0)
MCV: 93.9 fL (ref 80.0–100.0)
Platelets: 184 10*3/uL (ref 150–400)
RBC: 3.61 MIL/uL — ABNORMAL LOW (ref 4.22–5.81)
RDW: 13.9 % (ref 11.5–15.5)
WBC: 10.8 10*3/uL — ABNORMAL HIGH (ref 4.0–10.5)
nRBC: 0 % (ref 0.0–0.2)

## 2022-12-26 LAB — TROPONIN I (HIGH SENSITIVITY): Troponin I (High Sensitivity): 6 ng/L (ref <18)

## 2022-12-26 MED ORDER — MORPHINE SULFATE (PF) 2 MG/ML IV SOLN
2.0000 mg | Freq: Once | INTRAVENOUS | Status: AC
  Administered 2022-12-26: 2 mg via INTRAVENOUS
  Filled 2022-12-26: qty 1

## 2022-12-26 MED ORDER — HYDROCODONE-ACETAMINOPHEN 5-325 MG PO TABS
1.0000 | ORAL_TABLET | ORAL | Status: DC | PRN
  Administered 2022-12-26 – 2022-12-31 (×10): 1 via ORAL
  Filled 2022-12-26 (×10): qty 1

## 2022-12-26 MED ORDER — ALFUZOSIN HCL ER 10 MG PO TB24
10.0000 mg | ORAL_TABLET | Freq: Every evening | ORAL | Status: DC
  Administered 2022-12-27 – 2022-12-30 (×4): 10 mg via ORAL
  Filled 2022-12-26 (×4): qty 1

## 2022-12-26 MED ORDER — DONEPEZIL HCL 5 MG PO TABS
5.0000 mg | ORAL_TABLET | Freq: Every day | ORAL | Status: DC
  Administered 2022-12-27 – 2022-12-30 (×4): 5 mg via ORAL
  Filled 2022-12-26 (×4): qty 1

## 2022-12-26 MED ORDER — DUTASTERIDE 0.5 MG PO CAPS
0.5000 mg | ORAL_CAPSULE | Freq: Every evening | ORAL | Status: DC
  Administered 2022-12-27 – 2022-12-30 (×4): 0.5 mg via ORAL
  Filled 2022-12-26 (×4): qty 1

## 2022-12-26 MED ORDER — IRBESARTAN 75 MG PO TABS
37.5000 mg | ORAL_TABLET | Freq: Every day | ORAL | Status: DC
  Administered 2022-12-27 – 2022-12-31 (×4): 37.5 mg via ORAL
  Filled 2022-12-26 (×6): qty 0.5

## 2022-12-26 MED ORDER — ACETAMINOPHEN 325 MG PO TABS: 650.0000 mg | ORAL_TABLET | Freq: Four times a day (QID) | ORAL | Status: DC | PRN

## 2022-12-26 MED ORDER — MORPHINE SULFATE (PF) 2 MG/ML IV SOLN
2.0000 mg | INTRAVENOUS | Status: DC | PRN
  Administered 2022-12-27 – 2022-12-28 (×2): 2 mg via INTRAVENOUS
  Filled 2022-12-26 (×2): qty 1

## 2022-12-26 MED ORDER — CINACALCET HCL 30 MG PO TABS
30.0000 mg | ORAL_TABLET | ORAL | Status: DC
  Administered 2022-12-27 – 2022-12-30 (×2): 30 mg via ORAL
  Filled 2022-12-26 (×2): qty 1

## 2022-12-26 MED ORDER — ESCITALOPRAM OXALATE 10 MG PO TABS
10.0000 mg | ORAL_TABLET | Freq: Every day | ORAL | Status: DC
  Administered 2022-12-27 – 2022-12-30 (×4): 10 mg via ORAL
  Filled 2022-12-26 (×5): qty 1

## 2022-12-26 MED ORDER — MIDAZOLAM HCL 2 MG/2ML IJ SOLN
1.0000 mg | Freq: Once | INTRAMUSCULAR | Status: AC
  Administered 2022-12-26: 1 mg via INTRAVENOUS
  Filled 2022-12-26: qty 2

## 2022-12-26 MED ORDER — LIDOCAINE-EPINEPHRINE (PF) 2 %-1:200000 IJ SOLN
20.0000 mL | Freq: Once | INTRAMUSCULAR | Status: AC
  Administered 2022-12-26: 20 mL via INTRADERMAL
  Filled 2022-12-26: qty 20

## 2022-12-26 MED ORDER — ALBUTEROL SULFATE (2.5 MG/3ML) 0.083% IN NEBU: 3.0000 mL | INHALATION_SOLUTION | RESPIRATORY_TRACT | Status: DC | PRN

## 2022-12-26 MED ORDER — ONDANSETRON HCL 4 MG/2ML IJ SOLN
4.0000 mg | Freq: Once | INTRAMUSCULAR | Status: AC
  Administered 2022-12-26: 4 mg via INTRAVENOUS
  Filled 2022-12-26: qty 2

## 2022-12-26 MED ORDER — ACETAMINOPHEN 650 MG RE SUPP: 650.0000 mg | Freq: Four times a day (QID) | RECTAL | Status: DC | PRN

## 2022-12-26 MED ORDER — ATORVASTATIN CALCIUM 20 MG PO TABS
20.0000 mg | ORAL_TABLET | Freq: Every day | ORAL | Status: DC
  Administered 2022-12-27 – 2022-12-31 (×5): 20 mg via ORAL
  Filled 2022-12-26 (×5): qty 1

## 2022-12-26 MED ORDER — SODIUM CHLORIDE 0.9% FLUSH
3.0000 mL | Freq: Two times a day (BID) | INTRAVENOUS | Status: DC
  Administered 2022-12-26 – 2022-12-31 (×9): 3 mL via INTRAVENOUS

## 2022-12-26 MED ORDER — SODIUM CHLORIDE 0.9 % IV SOLN
INTRAVENOUS | Status: AC
  Administered 2022-12-27: 1000 mL via INTRAVENOUS

## 2022-12-26 NOTE — ED Provider Notes (Signed)
[facility] Provider Note    Event Date/Time   First MD Initiated Contact with Patient 12/26/22 1840     (approximate)   History   Fall   HPI  [name] Christensen is a 89 y.o. male with a past medical history of heart failure, presence of pacemaker, atrial fibrillation on anticoagulation who presents today for evaluation of a trip and fall.  He reports that he was helping his family members get something out of the trunk when he lost his balance and fell, striking his right hip and the right side of his chest on the ground, as well as his right side of his forehead.  He reports that he takes blood thinners.  There was no loss of consciousness.  This occurred at approximately 10:30 AM this morning.  He reports that he has pain in his hip, chest wall, and intermittently in his head where he is sustained hematoma.  He has not had any vomiting.  No shortness of breath.  He has been able to ambulate.  Patient Active Problem List   Diagnosis Date Noted   Syncope 11/20/2022   Zygoma fracture (HCC) 08/19/2022   Concussion 08/19/2022   Oral bleeding 08/19/2022   Cardiac pacemaker in situ 04/02/2022   Chronic idiopathic constipation 04/02/2022   Heart failure (HCC) 04/02/2022   Problem related to unspecified psychosocial circumstances 04/02/2022   Sensorineural hearing loss, bilateral 04/02/2022   Vitamin B12 deficiency (non anemic) 04/02/2022   Systolic and diastolic CHF, chronic (HCC) 03/26/2022   Anemia 09/25/2021   Osteoporosis 07/26/2021   Post-COVID chronic fatigue 07/26/2021   Diarrhea 07/03/2021   COVID-19 06/10/2021   Low blood pressure 06/10/2021   Aortic atherosclerosis (HCC) 04/02/2021   Apathy 04/02/2021   Memory problem 03/01/2021   Contusion of left shoulder 01/07/2021   Chronic renal insufficiency, stage 3 (moderate) (HCC) 01/01/2021   Persistent atrial fibrillation (HCC) 07/05/2019   Ischemic cardiomyopathy    Upper respiratory infection  09/23/2018   Hypocalciuric hypercalcemia 09/16/2018   Fall 08/17/2018   Facial pain 08/17/2018   Hematuria 05/20/2018   Bronchiectasis (HCC) assoc with possible MAI 03/11/2018   Dyspnea on exertion 03/10/2018   Atrial flutter (HCC) 03/09/2018   PVC's (premature ventricular contractions) 03/09/2018   Arthralgia 01/29/2018   Dry skin 12/22/2017   Carotid stenosis, asymptomatic, bilateral 12/22/2017   Dysuria 11/24/2017   Anxiety disorder 11/24/2017   Hepatic cirrhosis (HCC) 06/05/2017   Weight loss 04/24/2017   Sinus node dysfunction (HCC) 04/07/2017   Chronic cholecystitis s/p lap cholecystectomy 06/05/2017 02/24/2017   Pleural effusion 02/24/2017   Dyslipidemia 08/05/2016   Constipation 08/09/2015   Cough in adult 11/30/2014   LBP (low back pain) 08/30/2013   Lung infiltrate 07/19/2013   Ventricular vs Supraventricular bigeminy 04/16/2013   Implantable cardioverter-defibrillator (ICD) in situ December 09, 2012   Sudden cardiac death (HCC) 11/27/2012   CORONARY ARTERY BYPASS GRAFT, HX OF 10/11/2008   Insomnia 12/07/2007   Essential hypertension 09/01/2007   CAD (coronary artery disease) 09/01/2007   GERD 09/01/2007   BPH (benign prostatic hyperplasia) 09/01/2007          Physical Exam   Triage Vital Signs: ED Triage Vitals [12/26/22 1820]  Enc Vitals Group     BP (!) 198/95     Pulse Rate 68     Resp 20     Temp 98.3 F (36.8 C)     Temp Source Oral     SpO2 95 %  Weight 130 lb (59 kg)     Height 5\' 7"  (1.702 m)     Head Circumference      Peak Flow      Pain Score 5     Pain Loc      Pain Edu?      Excl. in GC?     Most recent vital signs: Vitals:   12/26/22 2200 12/26/22 2207  BP: 136/61   Pulse: 70   Resp: (!) 21   Temp:  98.4 F (36.9 C)  SpO2: 92%     Physical Exam Vitals and nursing note reviewed.  Constitutional:      General: Awake and alert. No acute distress.    Appearance: Normal appearance. The patient is normal weight.  HENT:      Head: Normocephalic.  Hematoma noted to right side of forehead    Mouth: Mucous membranes are moist.  Eyes:     General: PERRL. Normal EOMs        Right eye: No discharge.        Left eye: No discharge.     Conjunctiva/sclera: Conjunctivae normal.  Cardiovascular:     Rate and Rhythm: Normal rate and regular rhythm.     Pulses: Normal pulses.     Heart sounds: Normal heart sounds Pulmonary:     Effort: Pulmonary effort is normal. No respiratory distress.     Breath sounds: Normal breath sounds.  Mild right chest wall tenderness without ecchymosis, erythema, or abrasions noted. Abdominal:     Abdomen is soft. There is no abdominal tenderness. No rebound or guarding. No distention. Musculoskeletal:        General: No swelling. Normal range of motion.     Cervical back: Normal range of motion and neck supple. No midline cervical spine tenderness.  Full range of motion of neck.  Negative Spurling test.  Negative Lhermitte sign.  Normal strength and sensation in bilateral upper extremities. Normal grip strength bilaterally.  Normal intrinsic muscle function of the hand bilaterally.  Normal radial pulses bilaterally. Pelvis stable.  Negative logroll bilaterally.  Mild tenderness palpation to right greater trochanter without ecchymosis or erythema or swelling noted.  Able to actively and passively range both hips, knees, ankles.  Normal distal pulses. Skin:    General: Skin is warm and dry.     Capillary Refill: Capillary refill takes less than 2 seconds.     Findings: No rash.  Neurological:     Mental Status: The patient is awake and alert.   Neurological: GCS 15 alert and oriented x3 Normal speech, no expressive or receptive aphasia or dysarthria Cranial nerves II through XII intact Normal visual fields 5 out of 5 strength in all 4 extremities with intact sensation throughout No extremity drift Normal finger-to-nose testing, no limb or truncal ataxia    ED Results / Procedures /  Treatments   Labs (all labs ordered are listed, but only abnormal results are displayed) Labs Reviewed  BASIC METABOLIC PANEL - Abnormal; Notable for the following components:      Result Value   Sodium 132 (*)    Glucose, Bld 114 (*)    Calcium 10.7 (*)    All other components within normal limits  CBC - Abnormal; Notable for the following components:   WBC 10.8 (*)    RBC 3.61 (*)    Hemoglobin 10.8 (*)    HCT 33.9 (*)    All other components within normal limits  COMPREHENSIVE METABOLIC PANEL  CBC  TROPONIN I (HIGH SENSITIVITY)     EKG     RADIOLOGY I independently reviewed and interpreted imaging and agree with radiologists findings.     PROCEDURES:  Critical Care performed:   .Critical Care  Performed by: Jackelyn Hoehn, PA-C Authorized by: Jackelyn Hoehn, PA-C   Critical care provider statement:    Critical care time (minutes):  45   Critical care time was exclusive of:  Separately billable procedures and treating other patients   Critical care was necessary to treat or prevent imminent or life-threatening deterioration of the following conditions:  Respiratory failure and trauma   Critical care was time spent personally by me on the following activities:  Development of treatment plan with patient or surrogate, discussions with consultants, evaluation of patient's response to treatment, examination of patient, ordering and review of laboratory studies, ordering and review of radiographic studies, ordering and performing treatments and interventions, pulse oximetry, re-evaluation of patient's condition, review of old charts and obtaining history from patient or surrogate   I assumed direction of critical care for this patient from another provider in my specialty: no     Care discussed with: admitting provider   CHEST TUBE INSERTION  Date/Time: 12/26/2022 11:15 PM  Performed by: Jackelyn Hoehn, PA-C Authorized by: Jackelyn Hoehn, PA-C   Consent:    Consent  obtained:  Verbal and written   Consent given by:  Patient   Risks, benefits, and alternatives were discussed: yes     Risks discussed:  Bleeding, damage to surrounding structures, incomplete drainage, infection, pain and nerve damage   Alternatives discussed:  No treatment Universal protocol:    Procedure explained and questions answered to patient or proxy's satisfaction: yes     Relevant documents present and verified: yes     Test results available: yes     Imaging studies available: yes     Required blood products, implants, devices, and special equipment available: yes     Site/side marked: yes     Immediately prior to procedure, a time out was called: yes     Patient identity confirmed:  Verbally with patient and arm band Pre-procedure details:    Skin preparation:  Povidone-iodine   Preparation: Patient was prepped and draped in the usual sterile fashion   Sedation:    Sedation type:  Anxiolysis Anesthesia:    Anesthesia method:  Local infiltration   Local anesthetic:  Lidocaine 1% WITH epi Procedure details:    Placement location:  R lateral   Scalpel size:  11   Tube size (Fr):  Minicatheter   Ultrasound guidance: no     Tension pneumothorax: no     Tube connected to:  Suction and water seal   Drainage characteristics:  Air only   Suture material:  0 silk   Dressing:  4x4 sterile gauze and petrolatum-impregnated gauze Post-procedure details:    Post-insertion x-ray findings: tube in good position     Procedure completion:  Tolerated well, no immediate complications    MEDICATIONS ORDERED IN ED: Medications  alfuzosin (UROXATRAL) 24 hr tablet 10 mg (has no administration in time range)  atorvastatin (LIPITOR) tablet 20 mg (has no administration in time range)  cinacalcet (SENSIPAR) tablet 30 mg (has no administration in time range)  donepezil (ARICEPT) tablet 5 mg (has no administration in time range)  dutasteride (AVODART) capsule 0.5 mg (has no administration in  time range)  escitalopram (LEXAPRO) tablet 10 mg (has no administration in time  range)  irbesartan (AVAPRO) tablet 37.5 mg (has no administration in time range)  sodium chloride flush (NS) 0.9 % injection 3 mL (3 mLs Intravenous Given 12/26/22 2227)  0.9 %  sodium chloride infusion ( Intravenous New Bag/Given 12/26/22 2226)  acetaminophen (TYLENOL) tablet 650 mg (has no administration in time range)    Or  acetaminophen (TYLENOL) suppository 650 mg (has no administration in time range)  HYDROcodone-acetaminophen (NORCO/VICODIN) 5-325 MG per tablet 1 tablet (1 tablet Oral Given 12/26/22 2226)  morphine (PF) 2 MG/ML injection 2 mg (has no administration in time range)  albuterol (PROVENTIL) (2.5 MG/3ML) 0.083% nebulizer solution 3 mL (has no administration in time range)  midazolam (VERSED) injection 1 mg (1 mg Intravenous Given 12/26/22 2022)  lidocaine-EPINEPHrine (XYLOCAINE W/EPI) 2 %-1:200000 (PF) injection 20 mL (20 mLs Intradermal Given by Other 12/26/22 2024)  morphine (PF) 2 MG/ML injection 2 mg (2 mg Intravenous Given 12/26/22 2047)  ondansetron (ZOFRAN) injection 4 mg (4 mg Intravenous Given 12/26/22 2047)     IMPRESSION / MDM / ASSESSMENT AND PLAN / ED COURSE  I reviewed the triage vital signs and the nursing notes.   Differential diagnosis includes, but is not limited to, ACS, pneumothorax, intracranial hemorrhage, cervical spine injury.  Patient is awake and alert, hemodynamically stable and afebrile.  He has a normal oxygen saturation on room air.  He was placed on the cardiac monitor.  CT head and neck obtained per [place] criteria and demonstrates no acute injury.  Chest x-ray obtained given his chest pain, and this was shown to have a pneumothorax.  Labs are overall reassuring, troponin with in normal limits.  Radiologist called Dr. Cyril Loosen to report that the patient had a large right-sided pneumothorax.  Dr. Cyril Loosen and I explained these findings to patient and his wife, and recommended  placement of a chest tube/small pigtail catheter.  They agreed to undergo this procedure and consent form was signed.  Patient was moved to the major side of the emergency department and chest tube placed.  Patient tolerated the procedure well.  Recommended admission to the hospitalist service for traumatic pneumothorax.  Patient and family members understand and agree.  Patient was accepted by the hospitalist service.   Patient's presentation is most consistent with acute presentation with potential threat to life or bodily function.    FINAL CLINICAL IMPRESSION(S) / ED DIAGNOSES   Final diagnoses:  Traumatic pneumothorax, initial encounter  Injury of head, initial encounter  Fall, initial encounter     Rx / DC Orders   ED Discharge Orders     None        Note:  This document was prepared using Dragon voice recognition software and may include unintentional dictation errors.   Keturah Shavers 12/26/22 2317    Jene Every, MD 12/26/22 [phone]

## 2022-12-26 NOTE — ED Provider Notes (Deleted)
CHEST TUBE INSERTION  Date/Time: 12/26/2022 10:12 PM  Performed by: Lavonia Drafts, MD Authorized by: Lavonia Drafts, MD   Consent:    Consent obtained:  Written and verbal   Consent given by:  Patient   Risks, benefits, and alternatives were discussed: yes     Risks discussed:  Bleeding, incomplete drainage, nerve damage, infection, pain and damage to surrounding structures   Alternatives discussed:  No treatment Universal protocol:    Procedure explained and questions answered to patient or proxy's satisfaction: yes     Relevant documents present and verified: yes     Test results available: yes     Imaging studies available: yes     Required blood products, implants, devices, and special equipment available: yes     Site/side marked: yes     Immediately prior to procedure, a time out was called: yes     Patient identity confirmed:  Hospital-assigned identification number Pre-procedure details:    Skin preparation:  Povidone-iodine   Preparation: Patient was prepped and draped in the usual sterile fashion   Sedation:    Sedation type:  Anxiolysis Anesthesia:    Anesthesia method:  Local infiltration   Local anesthetic:  Lidocaine 1% WITH epi Procedure details:    Placement location:  R lateral   Scalpel size:  11   Tube size (Fr):  Minicatheter   Ultrasound guidance: no     Tension pneumothorax: no     Tube connected to:  Suction and water seal   Drainage characteristics:  Air only   Suture material:  0 silk   Dressing:  4x4 sterile gauze and petrolatum-impregnated gauze Post-procedure details:    Post-insertion x-ray findings: tube in good position     Procedure completion:  Tolerated well, no immediate complications     Lavonia Drafts, MD 12/26/22 2213

## 2022-12-26 NOTE — ED Notes (Addendum)
Dr. Corky Downs and Sheran Luz, PA are at bedside for chest tube insertion

## 2022-12-26 NOTE — ED Triage Notes (Addendum)
Pt lost balance this am getting things out of trunk of car.  Pt fell on concrete.  Pt has hematoma to right forehead.  No loc no vomiting.  Pt has right hip pain.  Pt can ambulate with pain.  Pt is on blood thinners.  Pt denies chest pain or sob.  Pt denies neck or back pain.   Pt alert  speech clear.

## 2022-12-26 NOTE — H&P (Signed)
History and Physical    Chief Complaint: Fall.   HISTORY OF PRESENT ILLNESS: [name] Christensen is an 89 y.o. male brought to ed for He reports that he was helping his family members get something out of the trunk when he lost his balance and fell @ 10:30 am , striking his right hip and the right side of his chest on the ground, as well as his right side of his forehead Sustained trauma to forehead with scalp hematoma and no loc or vomiting. PT able to ambulate . Take eliquis for his a.fibrillation.  Patient does not have any dizziness intermittently or presyncopal type of symptoms.   Pt has  Past Medical History:  Diagnosis Date   Actinic keratosis 06/09/2019   L sup forehead at hairline - bx proven   AICD (automatic cardioverter/defibrillator) present 04/07/2017   Allergy    Anxiety    BPH (benign prostatic hypertrophy)    elevated PSA Dr. Jilda Roche Bx 2010   CAP (community acquired pneumonia) 10/27/2012   rx levaquin 12/14/12  - esr 51 12/31/2012 and no eos  05/2021 likely a COVID complication.  Prescribed cefdinir antibiotic, cough syrup with codeine, Medrol Dosepak  1/23 CAP --sx's - 80+% better. CXR: COPD changes with chronic basilar interstitial disease and suspect  superimposed acute infiltrate at RIGHT base.   Cardiac arrest [facility])    a. out-of-hospital arrest 11/18/2012 - EF 40-45%, patent grafts on cath, received [employer] Jude AICD.   Carotid disease, bilateral ([place])    a. 0-39% by doppers.   Cataract    bil cataracts removed   Chest pain, atypical 07/21/2013   CHF (congestive heart failure) ([place])    Chills with fever 11/24/2017   12/18 Recurrent - ?prostatitis  1/19 ?URI  6/20    Chronic calculous cholecystitis 06/05/2017   Chronic diastolic CHF (congestive heart failure) ([place]) 03/09/2018   10/19 Probable pacemaker cardiomyopathy per Dr [name] w/a plan to   1) INCREASE cozaar (losartan) 50 mg - take 1 tablet by mouth once daily - d/c. Losartan was stopped on 05/12/19 due to high K  2)  START mexiletine 150 mg- take 1 tablet by mouth TWICE daily   Coronary artery disease    a. s/p MI/CABG 2005. b. s/p cath at time of VF arrest 10/2013 - grafts patent.   Cough    Diverticulosis    Elevated LFTs    a. 10/2012 felt due to cardiac arrest - Hepatitis C Ab reactive from 10/28/2012>>Hep C RNA PCR negative 10/28/2012.    GERD (gastroesophageal reflux disease)    Hyperlipidemia    Hypertension    Internal hemorrhoids    Myocardial infarction ([place])    2005 - CABG x 5 2005   Paroxysmal atrial fibrillation ([facility])    Continue on Eliquis, Toprol, Tikosyn  Dr [name]   RBBB    Tubular adenoma of colon    Unstable angina ([place])    Upper airway cough syndrome 03/26/2022   Onset ? Jan 2023   - try off Entresto  03/25/2022   - max gerd rx 08/01/2022 >>>       Ventricular bigeminy    a. Event monitor 01/2013: NSR with PVCs and occ bigeminy.   Weakness 04/09/2016   5/17 ?post-viral vs other, 3/18, 6/20, 1/23  CFS  He is very active for his age  83/23  Post-concussion  Take Lexapro and Toprol qhs  Reduce Toprol to 1 qhs     Review of Systems  Musculoskeletal:  Positive  History and Physical    Chief Complaint: Fall.   HISTORY OF PRESENT ILLNESS: [name] Christensen is an 89 y.o. male brought to ed for He reports that he was helping his family members get something out of the trunk when he lost his balance and fell @ 10:30 am , striking his right hip and the right side of his chest on the ground, as well as his right side of his forehead Sustained trauma to forehead with scalp hematoma and no loc or vomiting. PT able to ambulate . Take eliquis for his a.fibrillation.  Patient does not have any dizziness intermittently or presyncopal type of symptoms.   Pt has  Past Medical History:  Diagnosis Date   Actinic keratosis 06/09/2019   L sup forehead at hairline - bx proven   AICD (automatic cardioverter/defibrillator) present 04/07/2017   Allergy    Anxiety    BPH (benign prostatic hypertrophy)    elevated PSA Dr. Jilda Roche Bx 2010   CAP (community acquired pneumonia) 10/27/2012   rx levaquin 12/14/12  - esr 51 12/31/2012 and no eos  05/2021 likely a COVID complication.  Prescribed cefdinir antibiotic, cough syrup with codeine, Medrol Dosepak  1/23 CAP --sx's - 80+% better. CXR: COPD changes with chronic basilar interstitial disease and suspect  superimposed acute infiltrate at RIGHT base.   Cardiac arrest [facility])    a. out-of-hospital arrest 11/18/2012 - EF 40-45%, patent grafts on cath, received [employer] Jude AICD.   Carotid disease, bilateral ([place])    a. 0-39% by doppers.   Cataract    bil cataracts removed   Chest pain, atypical 07/21/2013   CHF (congestive heart failure) ([place])    Chills with fever 11/24/2017   12/18 Recurrent - ?prostatitis  1/19 ?URI  6/20    Chronic calculous cholecystitis 06/05/2017   Chronic diastolic CHF (congestive heart failure) ([place]) 03/09/2018   10/19 Probable pacemaker cardiomyopathy per Dr [name] w/a plan to   1) INCREASE cozaar (losartan) 50 mg - take 1 tablet by mouth once daily - d/c. Losartan was stopped on 05/12/19 due to high K  2)  START mexiletine 150 mg- take 1 tablet by mouth TWICE daily   Coronary artery disease    a. s/p MI/CABG 2005. b. s/p cath at time of VF arrest 10/2013 - grafts patent.   Cough    Diverticulosis    Elevated LFTs    a. 10/2012 felt due to cardiac arrest - Hepatitis C Ab reactive from 10/25/2012>>Hep C RNA PCR negative 10/28/2012.    GERD (gastroesophageal reflux disease)    Hyperlipidemia    Hypertension    Internal hemorrhoids    Myocardial infarction ([place])    2005 - CABG x 5 2005   Paroxysmal atrial fibrillation ([facility])    Continue on Eliquis, Toprol, Tikosyn  Dr [name]   RBBB    Tubular adenoma of colon    Unstable angina ([place])    Upper airway cough syndrome 03/26/2022   Onset ? Jan 2023   - try off Entresto  03/25/2022   - max gerd rx 08/01/2022 >>>       Ventricular bigeminy    a. Event monitor 01/2013: NSR with PVCs and occ bigeminy.   Weakness 04/09/2016   5/17 ?post-viral vs other, 3/18, 6/20, 1/23  CFS  He is very active for his age  83/23  Post-concussion  Take Lexapro and Toprol qhs  Reduce Toprol to 1 qhs     Review of Systems  Musculoskeletal:  Positive  History and Physical    Chief Complaint: Fall.   HISTORY OF PRESENT ILLNESS: [name] Christensen is an 89 y.o. male brought to ed for He reports that he was helping his family members get something out of the trunk when he lost his balance and fell @ 10:30 am , striking his right hip and the right side of his chest on the ground, as well as his right side of his forehead Sustained trauma to forehead with scalp hematoma and no loc or vomiting. PT able to ambulate . Take eliquis for his a.fibrillation.  Patient does not have any dizziness intermittently or presyncopal type of symptoms.   Pt has  Past Medical History:  Diagnosis Date   Actinic keratosis 06/09/2019   L sup forehead at hairline - bx proven   AICD (automatic cardioverter/defibrillator) present 04/07/2017   Allergy    Anxiety    BPH (benign prostatic hypertrophy)    elevated PSA Dr. Jilda Roche Bx 2010   CAP (community acquired pneumonia) 10/27/2012   rx levaquin 12/14/12  - esr 51 12/31/2012 and no eos  05/2021 likely a COVID complication.  Prescribed cefdinir antibiotic, cough syrup with codeine, Medrol Dosepak  1/23 CAP --sx's - 80+% better. CXR: COPD changes with chronic basilar interstitial disease and suspect  superimposed acute infiltrate at RIGHT base.   Cardiac arrest [facility])    a. out-of-hospital arrest 11/18/2012 - EF 40-45%, patent grafts on cath, received [employer] Jude AICD.   Carotid disease, bilateral ([place])    a. 0-39% by doppers.   Cataract    bil cataracts removed   Chest pain, atypical 07/21/2013   CHF (congestive heart failure) ([place])    Chills with fever 11/24/2017   12/18 Recurrent - ?prostatitis  1/19 ?URI  6/20    Chronic calculous cholecystitis 06/05/2017   Chronic diastolic CHF (congestive heart failure) ([place]) 03/09/2018   10/19 Probable pacemaker cardiomyopathy per Dr [name] w/a plan to   1) INCREASE cozaar (losartan) 50 mg - take 1 tablet by mouth once daily - d/c. Losartan was stopped on 05/12/19 due to high K  2)  START mexiletine 150 mg- take 1 tablet by mouth TWICE daily   Coronary artery disease    a. s/p MI/CABG 2005. b. s/p cath at time of VF arrest 10/2013 - grafts patent.   Cough    Diverticulosis    Elevated LFTs    a. 10/2012 felt due to cardiac arrest - Hepatitis C Ab reactive from 10/25/2012>>Hep C RNA PCR negative 10/28/2012.    GERD (gastroesophageal reflux disease)    Hyperlipidemia    Hypertension    Internal hemorrhoids    Myocardial infarction ([place])    2005 - CABG x 5 2005   Paroxysmal atrial fibrillation ([facility])    Continue on Eliquis, Toprol, Tikosyn  Dr [name]   RBBB    Tubular adenoma of colon    Unstable angina ([place])    Upper airway cough syndrome 03/26/2022   Onset ? Jan 2023   - try off Entresto  03/25/2022   - max gerd rx 08/01/2022 >>>       Ventricular bigeminy    a. Event monitor 01/2013: NSR with PVCs and occ bigeminy.   Weakness 04/09/2016   5/17 ?post-viral vs other, 3/18, 6/20, 1/23  CFS  He is very active for his age  83/23  Post-concussion  Take Lexapro and Toprol qhs  Reduce Toprol to 1 qhs     Review of Systems  Musculoskeletal:  Positive  History and Physical    Chief Complaint: Fall.   HISTORY OF PRESENT ILLNESS: [name] Christensen is an 89 y.o. male brought to ed for He reports that he was helping his family members get something out of the trunk when he lost his balance and fell @ 10:30 am , striking his right hip and the right side of his chest on the ground, as well as his right side of his forehead Sustained trauma to forehead with scalp hematoma and no loc or vomiting. PT able to ambulate . Take eliquis for his a.fibrillation.  Patient does not have any dizziness intermittently or presyncopal type of symptoms.   Pt has  Past Medical History:  Diagnosis Date   Actinic keratosis 06/09/2019   L sup forehead at hairline - bx proven   AICD (automatic cardioverter/defibrillator) present 04/07/2017   Allergy    Anxiety    BPH (benign prostatic hypertrophy)    elevated PSA Dr. Jilda Roche Bx 2010   CAP (community acquired pneumonia) 10/27/2012   rx levaquin 12/14/12  - esr 51 12/31/2012 and no eos  05/2021 likely a COVID complication.  Prescribed cefdinir antibiotic, cough syrup with codeine, Medrol Dosepak  1/23 CAP --sx's - 80+% better. CXR: COPD changes with chronic basilar interstitial disease and suspect  superimposed acute infiltrate at RIGHT base.   Cardiac arrest [facility])    a. out-of-hospital arrest 11/18/2012 - EF 40-45%, patent grafts on cath, received [employer] Jude AICD.   Carotid disease, bilateral ([place])    a. 0-39% by doppers.   Cataract    bil cataracts removed   Chest pain, atypical 07/21/2013   CHF (congestive heart failure) ([place])    Chills with fever 11/24/2017   12/18 Recurrent - ?prostatitis  1/19 ?URI  6/20    Chronic calculous cholecystitis 06/05/2017   Chronic diastolic CHF (congestive heart failure) ([place]) 03/09/2018   10/19 Probable pacemaker cardiomyopathy per Dr [name] w/a plan to   1) INCREASE cozaar (losartan) 50 mg - take 1 tablet by mouth once daily - d/c. Losartan was stopped on 05/12/19 due to high K  2)  START mexiletine 150 mg- take 1 tablet by mouth TWICE daily   Coronary artery disease    a. s/p MI/CABG 2005. b. s/p cath at time of VF arrest 10/2013 - grafts patent.   Cough    Diverticulosis    Elevated LFTs    a. 10/2012 felt due to cardiac arrest - Hepatitis C Ab reactive from 10/25/2012>>Hep C RNA PCR negative 10/28/2012.    GERD (gastroesophageal reflux disease)    Hyperlipidemia    Hypertension    Internal hemorrhoids    Myocardial infarction ([place])    2005 - CABG x 5 2005   Paroxysmal atrial fibrillation ([facility])    Continue on Eliquis, Toprol, Tikosyn  Dr [name]   RBBB    Tubular adenoma of colon    Unstable angina ([place])    Upper airway cough syndrome 03/26/2022   Onset ? Jan 2023   - try off Entresto  03/25/2022   - max gerd rx 08/01/2022 >>>       Ventricular bigeminy    a. Event monitor 01/2013: NSR with PVCs and occ bigeminy.   Weakness 04/09/2016   5/17 ?post-viral vs other, 3/18, 6/20, 1/23  CFS  He is very active for his age  83/23  Post-concussion  Take Lexapro and Toprol qhs  Reduce Toprol to 1 qhs     Review of Systems  Musculoskeletal:  Positive  History and Physical    Chief Complaint: Fall.   HISTORY OF PRESENT ILLNESS: [name] Christensen is an 89 y.o. male brought to ed for He reports that he was helping his family members get something out of the trunk when he lost his balance and fell @ 10:30 am , striking his right hip and the right side of his chest on the ground, as well as his right side of his forehead Sustained trauma to forehead with scalp hematoma and no loc or vomiting. PT able to ambulate . Take eliquis for his a.fibrillation.  Patient does not have any dizziness intermittently or presyncopal type of symptoms.   Pt has  Past Medical History:  Diagnosis Date   Actinic keratosis 06/09/2019   L sup forehead at hairline - bx proven   AICD (automatic cardioverter/defibrillator) present 04/07/2017   Allergy    Anxiety    BPH (benign prostatic hypertrophy)    elevated PSA Dr. Jilda Roche Bx 2010   CAP (community acquired pneumonia) 10/27/2012   rx levaquin 12/14/12  - esr 51 12/31/2012 and no eos  05/2021 likely a COVID complication.  Prescribed cefdinir antibiotic, cough syrup with codeine, Medrol Dosepak  1/23 CAP --sx's - 80+% better. CXR: COPD changes with chronic basilar interstitial disease and suspect  superimposed acute infiltrate at RIGHT base.   Cardiac arrest [facility])    a. out-of-hospital arrest 11/18/2012 - EF 40-45%, patent grafts on cath, received [employer] Jude AICD.   Carotid disease, bilateral ([place])    a. 0-39% by doppers.   Cataract    bil cataracts removed   Chest pain, atypical 07/21/2013   CHF (congestive heart failure) ([place])    Chills with fever 11/24/2017   12/18 Recurrent - ?prostatitis  1/19 ?URI  6/20    Chronic calculous cholecystitis 06/05/2017   Chronic diastolic CHF (congestive heart failure) ([place]) 03/09/2018   10/19 Probable pacemaker cardiomyopathy per Dr [name] w/a plan to   1) INCREASE cozaar (losartan) 50 mg - take 1 tablet by mouth once daily - d/c. Losartan was stopped on 05/12/19 due to high K  2)  START mexiletine 150 mg- take 1 tablet by mouth TWICE daily   Coronary artery disease    a. s/p MI/CABG 2005. b. s/p cath at time of VF arrest 10/2013 - grafts patent.   Cough    Diverticulosis    Elevated LFTs    a. 10/2012 felt due to cardiac arrest - Hepatitis C Ab reactive from 10/28/2012>>Hep C RNA PCR negative 10/28/2012.    GERD (gastroesophageal reflux disease)    Hyperlipidemia    Hypertension    Internal hemorrhoids    Myocardial infarction ([place])    2005 - CABG x 5 2005   Paroxysmal atrial fibrillation ([facility])    Continue on Eliquis, Toprol, Tikosyn  Dr [name]   RBBB    Tubular adenoma of colon    Unstable angina ([place])    Upper airway cough syndrome 03/26/2022   Onset ? Jan 2023   - try off Entresto  03/25/2022   - max gerd rx 08/01/2022 >>>       Ventricular bigeminy    a. Event monitor 01/2013: NSR with PVCs and occ bigeminy.   Weakness 04/09/2016   5/17 ?post-viral vs other, 3/18, 6/20, 1/23  CFS  He is very active for his age  83/23  Post-concussion  Take Lexapro and Toprol qhs  Reduce Toprol to 1 qhs     Review of Systems  Musculoskeletal:  Positive  History and Physical    Chief Complaint: Fall.   HISTORY OF PRESENT ILLNESS: [name] Christensen is an 89 y.o. male brought to ed for He reports that he was helping his family members get something out of the trunk when he lost his balance and fell @ 10:30 am , striking his right hip and the right side of his chest on the ground, as well as his right side of his forehead Sustained trauma to forehead with scalp hematoma and no loc or vomiting. PT able to ambulate . Take eliquis for his a.fibrillation.  Patient does not have any dizziness intermittently or presyncopal type of symptoms.   Pt has  Past Medical History:  Diagnosis Date   Actinic keratosis 06/09/2019   L sup forehead at hairline - bx proven   AICD (automatic cardioverter/defibrillator) present 04/07/2017   Allergy    Anxiety    BPH (benign prostatic hypertrophy)    elevated PSA Dr. Jilda Roche Bx 2010   CAP (community acquired pneumonia) 10/27/2012   rx levaquin 12/14/12  - esr 51 12/31/2012 and no eos  05/2021 likely a COVID complication.  Prescribed cefdinir antibiotic, cough syrup with codeine, Medrol Dosepak  1/23 CAP --sx's - 80+% better. CXR: COPD changes with chronic basilar interstitial disease and suspect  superimposed acute infiltrate at RIGHT base.   Cardiac arrest [facility])    a. out-of-hospital arrest 11/18/2012 - EF 40-45%, patent grafts on cath, received [employer] Jude AICD.   Carotid disease, bilateral ([place])    a. 0-39% by doppers.   Cataract    bil cataracts removed   Chest pain, atypical 07/21/2013   CHF (congestive heart failure) ([place])    Chills with fever 11/24/2017   12/18 Recurrent - ?prostatitis  1/19 ?URI  6/20    Chronic calculous cholecystitis 06/05/2017   Chronic diastolic CHF (congestive heart failure) ([place]) 03/09/2018   10/19 Probable pacemaker cardiomyopathy per Dr [name] w/a plan to   1) INCREASE cozaar (losartan) 50 mg - take 1 tablet by mouth once daily - d/c. Losartan was stopped on 05/12/19 due to high K  2)  START mexiletine 150 mg- take 1 tablet by mouth TWICE daily   Coronary artery disease    a. s/p MI/CABG 2005. b. s/p cath at time of VF arrest 10/2013 - grafts patent.   Cough    Diverticulosis    Elevated LFTs    a. 10/2012 felt due to cardiac arrest - Hepatitis C Ab reactive from 10/25/2012>>Hep C RNA PCR negative 10/28/2012.    GERD (gastroesophageal reflux disease)    Hyperlipidemia    Hypertension    Internal hemorrhoids    Myocardial infarction ([place])    2005 - CABG x 5 2005   Paroxysmal atrial fibrillation ([facility])    Continue on Eliquis, Toprol, Tikosyn  Dr [name]   RBBB    Tubular adenoma of colon    Unstable angina ([place])    Upper airway cough syndrome 03/26/2022   Onset ? Jan 2023   - try off Entresto  03/25/2022   - max gerd rx 08/01/2022 >>>       Ventricular bigeminy    a. Event monitor 01/2013: NSR with PVCs and occ bigeminy.   Weakness 04/09/2016   5/17 ?post-viral vs other, 3/18, 6/20, 1/23  CFS  He is very active for his age  83/23  Post-concussion  Take Lexapro and Toprol qhs  Reduce Toprol to 1 qhs     Review of Systems  Musculoskeletal:  Positive  History and Physical    Chief Complaint: Fall.   HISTORY OF PRESENT ILLNESS: [name] Christensen is an 89 y.o. male brought to ed for He reports that he was helping his family members get something out of the trunk when he lost his balance and fell @ 10:30 am , striking his right hip and the right side of his chest on the ground, as well as his right side of his forehead Sustained trauma to forehead with scalp hematoma and no loc or vomiting. PT able to ambulate . Take eliquis for his a.fibrillation.  Patient does not have any dizziness intermittently or presyncopal type of symptoms.   Pt has  Past Medical History:  Diagnosis Date   Actinic keratosis 06/09/2019   L sup forehead at hairline - bx proven   AICD (automatic cardioverter/defibrillator) present 04/07/2017   Allergy    Anxiety    BPH (benign prostatic hypertrophy)    elevated PSA Dr. Jilda Roche Bx 2010   CAP (community acquired pneumonia) 10/27/2012   rx levaquin 12/14/12  - esr 51 12/31/2012 and no eos  05/2021 likely a COVID complication.  Prescribed cefdinir antibiotic, cough syrup with codeine, Medrol Dosepak  1/23 CAP --sx's - 80+% better. CXR: COPD changes with chronic basilar interstitial disease and suspect  superimposed acute infiltrate at RIGHT base.   Cardiac arrest [facility])    a. out-of-hospital arrest 11/18/2012 - EF 40-45%, patent grafts on cath, received [employer]. Jude AICD.   Carotid disease, bilateral ([place])    a. 0-39% by doppers.   Cataract    bil cataracts removed   Chest pain, atypical 07/21/2013   CHF (congestive heart failure) ([place])    Chills with fever 11/24/2017   12/18 Recurrent - ?prostatitis  1/19 ?URI  6/20    Chronic calculous cholecystitis 06/05/2017   Chronic diastolic CHF (congestive heart failure) ([place]) 03/09/2018   10/19 Probable pacemaker cardiomyopathy per Dr [name] w/a plan to   1) INCREASE cozaar (losartan) 50 mg - take 1 tablet by mouth once daily - d/c. Losartan was stopped on 05/12/19 due to high K  2)  START mexiletine 150 mg- take 1 tablet by mouth TWICE daily   Coronary artery disease    a. s/p MI/CABG 2005. b. s/p cath at time of VF arrest 10/2013 - grafts patent.   Cough    Diverticulosis    Elevated LFTs    a. 10/2012 felt due to cardiac arrest - Hepatitis C Ab reactive from 10/28/2012>>Hep C RNA PCR negative 10/28/2012.    GERD (gastroesophageal reflux disease)    Hyperlipidemia    Hypertension    Internal hemorrhoids    Myocardial infarction ([place])    2005 - CABG x 5 2005   Paroxysmal atrial fibrillation ([facility])    Continue on Eliquis, Toprol, Tikosyn  Dr [name]   RBBB    Tubular adenoma of colon    Unstable angina ([place])    Upper airway cough syndrome 03/26/2022   Onset ? Jan 2023   - try off Entresto  03/25/2022   - max gerd rx 08/01/2022 >>>       Ventricular bigeminy    a. Event monitor 01/2013: NSR with PVCs and occ bigeminy.   Weakness 04/09/2016   5/17 ?post-viral vs other, 3/18, 6/20, 1/23  CFS  He is very active for his age  83/23  Post-concussion  Take Lexapro and Toprol qhs  Reduce Toprol to 1 qhs     Review of Systems  Musculoskeletal:  Positive  History and Physical    Chief Complaint: Fall.   HISTORY OF PRESENT ILLNESS: [name] Christensen is an 89 y.o. male brought to ed for He reports that he was helping his family members get something out of the trunk when he lost his balance and fell @ 10:30 am , striking his right hip and the right side of his chest on the ground, as well as his right side of his forehead Sustained trauma to forehead with scalp hematoma and no loc or vomiting. PT able to ambulate . Take eliquis for his a.fibrillation.  Patient does not have any dizziness intermittently or presyncopal type of symptoms.   Pt has  Past Medical History:  Diagnosis Date   Actinic keratosis 06/09/2019   L sup forehead at hairline - bx proven   AICD (automatic cardioverter/defibrillator) present 04/07/2017   Allergy    Anxiety    BPH (benign prostatic hypertrophy)    elevated PSA Dr. Jilda Roche Bx 2010   CAP (community acquired pneumonia) 10/27/2012   rx levaquin 12/14/12  - esr 51 12/31/2012 and no eos  05/2021 likely a COVID complication.  Prescribed cefdinir antibiotic, cough syrup with codeine, Medrol Dosepak  1/23 CAP --sx's - 80+% better. CXR: COPD changes with chronic basilar interstitial disease and suspect  superimposed acute infiltrate at RIGHT base.   Cardiac arrest [facility])    a. out-of-hospital arrest 11/18/2012 - EF 40-45%, patent grafts on cath, received [employer]. Jude AICD.   Carotid disease, bilateral ([place])    a. 0-39% by doppers.   Cataract    bil cataracts removed   Chest pain, atypical 07/21/2013   CHF (congestive heart failure) ([place])    Chills with fever 11/24/2017   12/18 Recurrent - ?prostatitis  1/19 ?URI  6/20    Chronic calculous cholecystitis 06/05/2017   Chronic diastolic CHF (congestive heart failure) ([place]) 03/09/2018   10/19 Probable pacemaker cardiomyopathy per Dr [name] w/a plan to   1) INCREASE cozaar (losartan) 50 mg - take 1 tablet by mouth once daily - d/c. Losartan was stopped on 05/12/19 due to high K  2)  START mexiletine 150 mg- take 1 tablet by mouth TWICE daily   Coronary artery disease    a. s/p MI/CABG 2005. b. s/p cath at time of VF arrest 10/2013 - grafts patent.   Cough    Diverticulosis    Elevated LFTs    a. 10/2012 felt due to cardiac arrest - Hepatitis C Ab reactive from 10/28/2012>>Hep C RNA PCR negative 10/28/2012.    GERD (gastroesophageal reflux disease)    Hyperlipidemia    Hypertension    Internal hemorrhoids    Myocardial infarction ([place])    2005 - CABG x 5 2005   Paroxysmal atrial fibrillation ([facility])    Continue on Eliquis, Toprol, Tikosyn  Dr [name]   RBBB    Tubular adenoma of colon    Unstable angina ([place])    Upper airway cough syndrome 03/26/2022   Onset ? Jan 2023   - try off Entresto  03/25/2022   - max gerd rx 08/01/2022 >>>       Ventricular bigeminy    a. Event monitor 01/2013: NSR with PVCs and occ bigeminy.   Weakness 04/09/2016   5/17 ?post-viral vs other, 3/18, 6/20, 1/23  CFS  He is very active for his age  83/23  Post-concussion  Take Lexapro and Toprol qhs  Reduce Toprol to 1 qhs     Review of Systems  Musculoskeletal:  Positive  History and Physical    Chief Complaint: Fall.   HISTORY OF PRESENT ILLNESS: [name] Christensen is an 89 y.o. male brought to ed for He reports that he was helping his family members get something out of the trunk when he lost his balance and fell @ 10:30 am , striking his right hip and the right side of his chest on the ground, as well as his right side of his forehead Sustained trauma to forehead with scalp hematoma and no loc or vomiting. PT able to ambulate . Take eliquis for his a.fibrillation.  Patient does not have any dizziness intermittently or presyncopal type of symptoms.   Pt has  Past Medical History:  Diagnosis Date   Actinic keratosis 06/09/2019   L sup forehead at hairline - bx proven   AICD (automatic cardioverter/defibrillator) present 04/07/2017   Allergy    Anxiety    BPH (benign prostatic hypertrophy)    elevated PSA Dr. Jilda Roche Bx 2010   CAP (community acquired pneumonia) 10/27/2012   rx levaquin 12/14/12  - esr 51 12/31/2012 and no eos  05/2021 likely a COVID complication.  Prescribed cefdinir antibiotic, cough syrup with codeine, Medrol Dosepak  1/23 CAP --sx's - 80+% better. CXR: COPD changes with chronic basilar interstitial disease and suspect  superimposed acute infiltrate at RIGHT base.   Cardiac arrest [facility])    a. out-of-hospital arrest 11/18/2012 - EF 40-45%, patent grafts on cath, received [employer] AICD.   Carotid disease, bilateral ([place])    a. 0-39% by doppers.   Cataract    bil cataracts removed   Chest pain, atypical 07/21/2013   CHF (congestive heart failure) ([place])    Chills with fever 11/24/2017   12/18 Recurrent - ?prostatitis  1/19 ?URI  6/20    Chronic calculous cholecystitis 06/05/2017   Chronic diastolic CHF (congestive heart failure) ([place]) 03/09/2018   10/19 Probable pacemaker cardiomyopathy per Dr [name] w/a plan to   1) INCREASE cozaar (losartan) 50 mg - take 1 tablet by mouth once daily - d/c. Losartan was stopped on 05/12/19 due to high K  2)  START mexiletine 150 mg- take 1 tablet by mouth TWICE daily   Coronary artery disease    a. s/p MI/CABG 2005. b. s/p cath at time of VF arrest 10/2013 - grafts patent.   Cough    Diverticulosis    Elevated LFTs    a. 10/2012 felt due to cardiac arrest - Hepatitis C Ab reactive from 10/25/2012>>Hep C RNA PCR negative 10/28/2012.    GERD (gastroesophageal reflux disease)    Hyperlipidemia    Hypertension    Internal hemorrhoids    Myocardial infarction ([place])    2005 - CABG x 5 2005   Paroxysmal atrial fibrillation ([facility])    Continue on Eliquis, Toprol, Tikosyn  Dr [name]   RBBB    Tubular adenoma of colon    Unstable angina ([place])    Upper airway cough syndrome 03/26/2022   Onset ? Jan 2023   - try off Entresto  03/25/2022   - max gerd rx 08/01/2022 >>>       Ventricular bigeminy    a. Event monitor 01/2013: NSR with PVCs and occ bigeminy.   Weakness 04/09/2016   5/17 ?post-viral vs other, 3/18, 6/20, 1/23  CFS  He is very active for his age  83/23  Post-concussion  Take Lexapro and Toprol qhs  Reduce Toprol to 1 qhs     Review of Systems  Musculoskeletal:  Positive  History and Physical    Chief Complaint: Fall.   HISTORY OF PRESENT ILLNESS: [name] Christensen is an 89 y.o. male brought to ed for He reports that he was helping his family members get something out of the trunk when he lost his balance and fell @ 10:30 am , striking his right hip and the right side of his chest on the ground, as well as his right side of his forehead Sustained trauma to forehead with scalp hematoma and no loc or vomiting. PT able to ambulate . Take eliquis for his a.fibrillation.  Patient does not have any dizziness intermittently or presyncopal type of symptoms.   Pt has  Past Medical History:  Diagnosis Date   Actinic keratosis 06/09/2019   L sup forehead at hairline - bx proven   AICD (automatic cardioverter/defibrillator) present 04/07/2017   Allergy    Anxiety    BPH (benign prostatic hypertrophy)    elevated PSA Dr. Jilda Roche Bx 2010   CAP (community acquired pneumonia) 10/27/2012   rx levaquin 12/14/12  - esr 51 12/31/2012 and no eos  05/2021 likely a COVID complication.  Prescribed cefdinir antibiotic, cough syrup with codeine, Medrol Dosepak  1/23 CAP --sx's - 80+% better. CXR: COPD changes with chronic basilar interstitial disease and suspect  superimposed acute infiltrate at RIGHT base.   Cardiac arrest [facility])    a. out-of-hospital arrest 11/18/2012 - EF 40-45%, patent grafts on cath, received [employer] AICD.   Carotid disease, bilateral ([place])    a. 0-39% by doppers.   Cataract    bil cataracts removed   Chest pain, atypical 07/21/2013   CHF (congestive heart failure) ([place])    Chills with fever 11/24/2017   12/18 Recurrent - ?prostatitis  1/19 ?URI  6/20    Chronic calculous cholecystitis 06/05/2017   Chronic diastolic CHF (congestive heart failure) ([place]) 03/09/2018   10/19 Probable pacemaker cardiomyopathy per Dr [name] w/a plan to   1) INCREASE cozaar (losartan) 50 mg - take 1 tablet by mouth once daily - d/c. Losartan was stopped on 05/12/19 due to high K  2)  START mexiletine 150 mg- take 1 tablet by mouth TWICE daily   Coronary artery disease    a. s/p MI/CABG 2005. b. s/p cath at time of VF arrest 10/2013 - grafts patent.   Cough    Diverticulosis    Elevated LFTs    a. 10/2012 felt due to cardiac arrest - Hepatitis C Ab reactive from 10/28/2012>>Hep C RNA PCR negative 10/28/2012.    GERD (gastroesophageal reflux disease)    Hyperlipidemia    Hypertension    Internal hemorrhoids    Myocardial infarction ([place])    2005 - CABG x 5 2005   Paroxysmal atrial fibrillation ([facility])    Continue on Eliquis, Toprol, Tikosyn  Dr [name]   RBBB    Tubular adenoma of colon    Unstable angina ([place])    Upper airway cough syndrome 03/26/2022   Onset ? Jan 2023   - try off Entresto  03/25/2022   - max gerd rx 08/01/2022 >>>       Ventricular bigeminy    a. Event monitor 01/2013: NSR with PVCs and occ bigeminy.   Weakness 04/09/2016   5/17 ?post-viral vs other, 3/18, 6/20, 1/23  CFS  He is very active for his age  83/23  Post-concussion  Take Lexapro and Toprol qhs  Reduce Toprol to 1 qhs     Review of Systems  Musculoskeletal:  Positive

## 2022-12-26 NOTE — ED Notes (Signed)
Patient wife was called to let her know what room her husband has been assigned at the request of the patient.  Voicemail was left after no answer.

## 2022-12-26 NOTE — ED Notes (Signed)
This RN and Sheran Luz, PA at bedside while pt signed consent.

## 2022-12-26 NOTE — ED Provider Notes (Signed)
Patient seen and presently by me, I spoke with radiologist who reports large right pneumothorax.  I have discussed at length with the patient the risks and benefits of chest tube insertion.  He is on Eliquis and he understands there is an increased risk of bleeding but he agrees with the need to do the procedure  Consent filed   Lavonia Drafts, MD 12/26/22 2016

## 2022-12-27 ENCOUNTER — Inpatient Hospital Stay: Payer: PPO

## 2022-12-27 ENCOUNTER — Encounter: Payer: Self-pay | Admitting: Internal Medicine

## 2022-12-27 ENCOUNTER — Inpatient Hospital Stay: Payer: PPO | Admitting: Radiology

## 2022-12-27 DIAGNOSIS — W19XXXA Unspecified fall, initial encounter: Secondary | ICD-10-CM | POA: Diagnosis not present

## 2022-12-27 DIAGNOSIS — S270XXA Traumatic pneumothorax, initial encounter: Principal | ICD-10-CM

## 2022-12-27 DIAGNOSIS — Z938 Other artificial opening status: Secondary | ICD-10-CM | POA: Insufficient documentation

## 2022-12-27 DIAGNOSIS — J939 Pneumothorax, unspecified: Secondary | ICD-10-CM | POA: Diagnosis present

## 2022-12-27 HISTORY — PX: IR CATHETER TUBE CHANGE: IMG717

## 2022-12-27 LAB — CBC
HCT: 29 % — ABNORMAL LOW (ref 39.0–52.0)
Hemoglobin: 9.4 g/dL — ABNORMAL LOW (ref 13.0–17.0)
MCH: 29.8 pg (ref 26.0–34.0)
MCHC: 32.4 g/dL (ref 30.0–36.0)
MCV: 92.1 fL (ref 80.0–100.0)
Platelets: 158 10*3/uL (ref 150–400)
RBC: 3.15 MIL/uL — ABNORMAL LOW (ref 4.22–5.81)
RDW: 14.1 % (ref 11.5–15.5)
WBC: 9.7 10*3/uL (ref 4.0–10.5)
nRBC: 0 % (ref 0.0–0.2)

## 2022-12-27 LAB — COMPREHENSIVE METABOLIC PANEL
ALT: 10 U/L (ref 0–44)
AST: 24 U/L (ref 15–41)
Albumin: 3.2 g/dL — ABNORMAL LOW (ref 3.5–5.0)
Alkaline Phosphatase: 75 U/L (ref 38–126)
Anion gap: 0 — ABNORMAL LOW (ref 5–15)
BUN: 21 mg/dL (ref 8–23)
CO2: 29 mmol/L (ref 22–32)
Calcium: 10.1 mg/dL (ref 8.9–10.3)
Chloride: 103 mmol/L (ref 98–111)
Creatinine, Ser: 1.09 mg/dL (ref 0.61–1.24)
GFR, Estimated: >60 mL/min (ref >60)
Glucose, Bld: 112 mg/dL — ABNORMAL HIGH (ref 70–99)
Potassium: 4.3 mmol/L (ref 3.5–5.1)
Sodium: 132 mmol/L — ABNORMAL LOW (ref 135–145)
Total Bilirubin: 1 mg/dL (ref 0.3–1.2)
Total Protein: 7.2 g/dL (ref 6.5–8.1)

## 2022-12-27 LAB — IRON AND TIBC
Iron: 20 ug/dL — ABNORMAL LOW (ref 45–182)
Saturation Ratios: 7 % — ABNORMAL LOW (ref 17.9–39.5)
TIBC: 276 ug/dL (ref 250–450)
UIBC: 256 ug/dL

## 2022-12-27 LAB — PHOSPHORUS: Phosphorus: 2.7 mg/dL (ref 2.5–4.6)

## 2022-12-27 LAB — FOLATE: Folate: 34 ng/mL (ref >5.9)

## 2022-12-27 LAB — MAGNESIUM: Magnesium: 2.1 mg/dL (ref 1.7–2.4)

## 2022-12-27 LAB — VITAMIN B12: Vitamin B-12: 990 pg/mL — ABNORMAL HIGH (ref 180–914)

## 2022-12-27 MED ORDER — DOFETILIDE 250 MCG PO CAPS
250.0000 ug | ORAL_CAPSULE | Freq: Two times a day (BID) | ORAL | Status: DC
  Administered 2022-12-27 – 2022-12-31 (×9): 250 ug via ORAL
  Filled 2022-12-27 (×10): qty 1

## 2022-12-27 MED ORDER — METOPROLOL SUCCINATE ER 50 MG PO TB24
50.0000 mg | ORAL_TABLET | Freq: Every day | ORAL | Status: DC
  Administered 2022-12-28 – 2022-12-31 (×4): 50 mg via ORAL
  Filled 2022-12-27 (×5): qty 1

## 2022-12-27 MED ORDER — LIDOCAINE HCL (PF) 1 % IJ SOLN
10.0000 mL | Freq: Once | INTRAMUSCULAR | Status: AC
  Administered 2022-12-27: 10 mL
  Filled 2022-12-27: qty 10

## 2022-12-27 MED ORDER — FENTANYL CITRATE (PF) 100 MCG/2ML IJ SOLN
INTRAMUSCULAR | Status: AC
  Filled 2022-12-27: qty 2

## 2022-12-27 MED ORDER — FAMOTIDINE 20 MG PO TABS
20.0000 mg | ORAL_TABLET | Freq: Every day | ORAL | Status: DC
  Administered 2022-12-27 – 2022-12-31 (×5): 20 mg via ORAL
  Filled 2022-12-27 (×5): qty 1

## 2022-12-27 MED ORDER — SODIUM CHLORIDE 0.9 % IV SOLN
200.0000 mg | INTRAVENOUS | Status: DC
  Administered 2022-12-27 – 2022-12-30 (×4): 200 mg via INTRAVENOUS
  Filled 2022-12-27 (×2): qty 200
  Filled 2022-12-27: qty 10
  Filled 2022-12-27 (×2): qty 200

## 2022-12-27 MED ORDER — FENTANYL CITRATE (PF) 100 MCG/2ML IJ SOLN
INTRAMUSCULAR | Status: AC | PRN
  Administered 2022-12-27 (×2): 25 ug via INTRAVENOUS

## 2022-12-27 MED ORDER — FENTANYL CITRATE (PF) 100 MCG/2ML IJ SOLN
INTRAMUSCULAR | Status: AC | PRN
  Administered 2022-12-27: 25 ug via INTRAVENOUS

## 2022-12-27 MED ORDER — HYDROCODONE BIT-HOMATROP MBR 5-1.5 MG/5ML PO SOLN: 5.0000 mL | Freq: Four times a day (QID) | ORAL | Status: DC | PRN

## 2022-12-27 MED ORDER — MIDAZOLAM HCL 2 MG/2ML IJ SOLN
INTRAMUSCULAR | Status: AC | PRN
  Administered 2022-12-27: .5 mg via INTRAVENOUS

## 2022-12-27 MED ORDER — MIDAZOLAM HCL 2 MG/2ML IJ SOLN
INTRAMUSCULAR | Status: AC
  Filled 2022-12-27: qty 2

## 2022-12-27 MED ORDER — LIDOCAINE HCL 1 % IJ SOLN
INTRAMUSCULAR | Status: AC
  Administered 2022-12-27: 5 mL
  Filled 2022-12-27: qty 20

## 2022-12-27 MED ORDER — NITROGLYCERIN 0.4 MG SL SUBL: 0.4000 mg | SUBLINGUAL_TABLET | SUBLINGUAL | Status: DC | PRN

## 2022-12-27 NOTE — Consult Note (Addendum)
[facility] SURGICAL CONSULTATION NOTE (initial) - cpt: 81191   HISTORY OF PRESENT ILLNESS (HPI):  89 y.o. male presented to [facility] ED yesterday for evaluation following fall. Patient reports he was helping family members unload the trunk of a car when he tripped and fell backwards onto his right side. He reports striking his right hip, chest, and head. He denied any LOC. No neck pain, headache, weakness, sensation loss. Otherwise, no fever, chills, nausea, emesis, nor abdominal pain. He is on Eliquis secondary to a history of atrial fibrillation. Work up in the ED revealed a normal Head / Neck CT. Labs were reassuring. However, he was found to have a right sided pneumothorax. EDP was able to place CT at bedside. He was admitted to the medicine service.   Surgery is consulted by hospitalist physician Dr. Irena Cords, MD in this context for evaluation and management of traumatic right pneumothorax.  This morning, he reports that he is feeling better. He does not have any significant complaints of pain. No SOB., Unfortunately, KUB showed recurrence of right PTX. On evaluation and review of imaging, chest tube retracted into the subcutaneous tissue.    PAST MEDICAL HISTORY (PMH):  Past Medical History:  Diagnosis Date   Actinic keratosis 06/09/2019   L sup forehead at hairline - bx proven   AICD (automatic cardioverter/defibrillator) present 04/07/2017   Allergy    Anxiety    BPH (benign prostatic hypertrophy)    elevated PSA Dr. Lindell Noe Bx 2010   CAP (community acquired pneumonia) 11/15/2012   rx levaquin 12/14/12  - esr 51 12/31/2012 and no eos  05/2021 likely a COVID complication.  Prescribed cefdinir antibiotic, cough syrup with codeine, Medrol Dosepak  1/23 CAP --sx's - 80+% better. CXR: COPD changes with chronic basilar interstitial disease and suspect  superimposed acute infiltrate at RIGHT base.   Cardiac arrest [facility])    a. out-of-hospital arrest 11/19/2012 - EF 40-45%,  patent grafts on cath, received [employer] AICD.   Carotid disease, bilateral (HCC)    a. 0-39% by doppers.   Cataract    bil cataracts removed   Chest pain, atypical 07/21/2013   CHF (congestive heart failure) (HCC)    Chills with fever 11/24/2017   12/18 Recurrent - ?prostatitis  1/19 ?URI  6/20    Chronic calculous cholecystitis 06/05/2017   Chronic diastolic CHF (congestive heart failure) (HCC) 03/09/2018   10/19 Probable pacemaker cardiomyopathy per Dr Graciela Husbands w/a plan to   1) INCREASE cozaar (losartan) 50 mg - take 1 tablet by mouth once daily - d/c. Losartan was stopped on 05/12/19 due to high K  2) START mexiletine 150 mg- take 1 tablet by mouth TWICE daily   Coronary artery disease    a. s/p MI/CABG 2005. b. s/p cath at time of VF arrest 10/2013 - grafts patent.   Cough    Diverticulosis    Elevated LFTs    a. 10/2012 felt due to cardiac arrest - Hepatitis C Ab reactive from 11/16/2012>>Hep C RNA PCR negative 2012/11/25.    GERD (gastroesophageal reflux disease)    Hyperlipidemia    Hypertension    Internal hemorrhoids    Myocardial infarction (HCC)    2005 - CABG x 5 2005   Paroxysmal atrial fibrillation (HCC)    Continue on Eliquis, Toprol, Tikosyn  Dr Graciela Husbands   RBBB    Tubular adenoma of colon    Unstable angina (HCC)    Upper airway cough syndrome 03/26/2022   Onset ?  Jan 2023   - try off Entresto  03/25/2022   - max gerd rx 08/01/2022 >>>       Ventricular bigeminy    a. Event monitor 01/2013: NSR with PVCs and occ bigeminy.   Weakness 04/09/2016   5/17 ?post-viral vs other, 3/18, 6/20, 1/23  CFS  He is very active for his age  40/23  Post-concussion  Take Lexapro and Toprol qhs  Reduce Toprol to 1 qhs     PAST SURGICAL HISTORY (PSH):  Past Surgical History:  Procedure Laterality Date   APPENDECTOMY     CARDIAC CATHETERIZATION  2007   with patent graft anatomy atretic left internal mammary  artery to the LAD which is nonobstructive. Will restart study June 08, 2007    CARDIOVERSION N/A 03/27/2018   Procedure: CARDIOVERSION;  Surgeon: Iran Ouch, MD;  Location: [facility] ORS;  Service: Cardiovascular;  Laterality: N/A;   CARDIOVERSION N/A 01/04/2019   Procedure: CARDIOVERSION (CATH LAB);  Surgeon: Iran Ouch, MD;  Location: [facility] ORS;  Service: Cardiovascular;  Laterality: N/A;   CARDIOVERSION N/A 07/07/2019   Procedure: CARDIOVERSION;  Surgeon: Wendall Stade, MD;  Location: [facility] ENDOSCOPY;  Service: Cardiovascular;  Laterality: N/A;   CATARACT EXTRACTION W/PHACO Right 04/23/2016   Procedure: CATARACT EXTRACTION PHACO AND INTRAOCULAR LENS PLACEMENT (IOC);  Surgeon: [name], MD;  Location: [facility] ORS;  Service: Ophthalmology;  Laterality: Right;  [place] 1.09 AP% 19.3 CDE 13.38 Fluid pack lot # [id number]   CHOLECYSTECTOMY N/A 06/05/2017   Procedure: LAPAROSCOPIC CHOLECYSTECTOMY WITH INTRAOPERATIVE CHOLANGIOGRAM ERAS PATHWAY POSSIBLE NEEDLE CORE BIOPSY OF LIVER;  Surgeon: [name], MD;  Location: [facility] OR;  Service: General;  Laterality: N/A;  ERAS PATHWAY   COLONOSCOPY     CORONARY ARTERY BYPASS GRAFT  2005   EYE SURGERY     ICD GENERATOR CHANGEOUT N/A 04/07/2017   Procedure: ICD Generator Changeout;  Surgeon: Duke Salvia, MD;  Location: [facility] INVASIVE CV LAB;  Service: Cardiovascular;  Laterality: N/A;   IMPLANTABLE CARDIOVERTER DEFIBRILLATOR IMPLANT N/A 11/16/2012   STJ single chamber ICD implanted by Dr Graciela Husbands for cardiac arrest    INGUINAL HERNIA REPAIR Bilateral 01/17/2015   Procedure: BILATERAL LAPAROSCOPIC INGUINAL HERNIA REPAIR WITH LEFT FEMORAL HERNIA REPAIR;  Surgeon: [name], MD;  Location: [facility] OR;  Service: General;  Laterality: Bilateral;   INSERTION OF MESH Bilateral 01/17/2015   Procedure: INSERTION OF MESH;  Surgeon: [name], MD;  Location: [facility] OR;  Service: General;  Laterality: Bilateral;   LAPAROSCOPIC CHOLECYSTECTOMY  06/05/2017   LEAD INSERTION N/A 04/07/2017   Procedure: RA Lead Insertion;  Surgeon: Duke Salvia, MD;   Location: [facility] INVASIVE CV LAB;  Service: Cardiovascular;  Laterality: N/A;   LEAD REVISION/REPAIR N/A 04/08/2017   Procedure: Atrial Lead Revision/Repair;  Surgeon: Duke Salvia, MD;  Location: [facility] INVASIVE CV LAB;  Service: Cardiovascular;  Laterality: N/A;   LEFT HEART CATH AND CORONARY ANGIOGRAPHY Left 05/14/2019   Procedure: LEFT HEART CATH AND CORONARY ANGIOGRAPHY;  Surgeon: Iran Ouch, MD;  Location: [facility] INVASIVE CV LAB;  Service: Cardiovascular;  Laterality: Left;   LEFT HEART CATHETERIZATION WITH CORONARY/GRAFT ANGIOGRAM N/A 11/19/2012   Procedure: LEFT HEART CATHETERIZATION WITH Isabel Caprice;  Surgeon: [name], MD;  Location: [facility] CATH LAB;  Service: Cardiovascular;  Laterality: N/A;   LIVER BIOPSY N/A 06/05/2017   Procedure: SINGLE SITE LIVER BIOPSY ERAS PATHWAY;  Surgeon: [name], MD;  Location: [facility] OR;  Service: General;  Laterality: N/A;  ERAS PATHWAY  LUMBAR FUSION  09/2007     MEDICATIONS:  Prior to Admission medications   Medication Sig Start Date End Date Taking? Authorizing Provider  acetaminophen (TYLENOL) 500 MG tablet Take 500 mg by mouth at bedtime.    [provider]  alfuzosin (UROXATRAL) 10 MG 24 hr tablet Take 10 mg by mouth every evening.     [provider]  atorvastatin (LIPITOR) 40 MG tablet Take 20 mg by mouth daily. 03/20/22   [provider]  benzonatate (TESSALON) 100 MG capsule Take 100 mg by mouth 3 (three) times daily as needed for cough.    [provider]  betamethasone dipropionate 0.05 % cream Apply 1 application. topically 2 (two) times daily as needed.    [provider]  cinacalcet (SENSIPAR) 30 MG tablet Take 1 tablet (30 mg total) by mouth 3 (three) times a week. 08/28/22   Reather Littler, MD  clindamycin (CLEOCIN) 300 MG capsule Take 1 capsule (300 mg total) by mouth 3 (three) times daily. Patient not taking: Reported on 11/20/2022 08/07/22   Glynn Octave, MD   Dextromethorphan-guaiFENesin [facility] DM PO) Take 30 mg by mouth in the morning and at bedtime.    [provider]  dofetilide (TIKOSYN) 250 MCG capsule TAKE 1 CAPSULE BY MOUTH TWICE A DAY 11/21/22   Duke Salvia, MD  donepezil (ARICEPT) 5 MG tablet Take 1 tablet (5 mg total) by mouth at bedtime. 07/03/22   Plotnikov, Georgina Quint, MD  dutasteride (AVODART) 0.5 MG capsule Take 0.5 mg by mouth every evening.     [provider]  ELIQUIS 5 MG TABS tablet TAKE 1 TABLET BY MOUTH TWICE (2) DAILY Patient taking differently: Take 5 mg by mouth 2 (two) times daily. 09/10/21   Plotnikov, Georgina Quint, MD  escitalopram (LEXAPRO) 10 MG tablet Take 1 tablet (10 mg total) by mouth at bedtime. 08/19/22   Plotnikov, Georgina Quint, MD  famotidine (PEPCID) 20 MG tablet Take 20 mg by mouth daily.    [provider]  guaiFENesin (MUCINEX) 600 MG 12 hr tablet Take 600 mg by mouth as directed. 10/08/22   [provider]  HYDROcodone bit-homatropine (HYCODAN) 5-1.5 MG/5ML syrup TAKE 5 MLS BY MOUTH EVERY 6 HOURS AS NEEDED FOR COUGH Patient taking differently: Take 5 mLs by mouth every 6 (six) hours as needed for cough. 09/19/22   Plotnikov, Georgina Quint, MD  metoprolol succinate (TOPROL-XL) 50 MG 24 hr tablet TAKE 1 TABLET BY MOUTH TWICE A DAY. TAKEWITH OR IMMEDIATELY FOLLOWING A MEAL Patient taking differently: Take 50 mg by mouth daily. TAKE 1 TABLET BY MOUTH TWICE A DAY. TAKEWITH OR IMMEDIATELY FOLLOWING A MEAL 08/27/22   Plotnikov, Georgina Quint, MD  Multiple Vitamin (MULTIVITAMIN WITH MINERALS) TABS tablet Take 1 tablet by mouth every other day.    [provider]  nitroGLYCERIN (NITROSTAT) 0.4 MG SL tablet DISSOLVE 1 TABLET UNDER TONGUE AS NEEDEDFOR CHEST PAIN. MAY REPEAT 5 MINUTES APART 3 TIMES IF NEEDED 05/30/21   Plotnikov, Georgina Quint, MD  omeprazole (PRILOSEC) 20 MG capsule Take 20 mg by mouth daily.    [provider]  Plecanatide 3 MG TABS Take 3 mg by mouth daily. Replaces  (Linaclotide) Linzess 03/25/22   [provider]  Polyethyl Glycol-Propyl Glycol (SYSTANE OP) Place 1 drop into both eyes daily as needed (burning eyes).     [provider]  triamcinolone cream (KENALOG) 0.1 % APPLY 1 APPLICATION TOPICALLY TWO TIMES DAILY AS NEEDED AVOIDING Rowan Blase ANDARMPITS Patient  taking differently: Apply 1 Application topically 2 (two) times daily. 03/13/22   Deirdre Evener, MD  valsartan (DIOVAN) 80 MG tablet Take 1 tablet (80 mg total) by mouth daily. Take 1 tablet (80 mg) by mouth once daily 11/24/22   Almon Hercules, MD  vitamin B-12 (CYANOCOBALAMIN) 1000 MCG tablet Take 1,000 mcg by mouth daily.    [provider]     ALLERGIES:  Allergies  Allergen Reactions   Ezetimibe Other (See Comments)    Pt was in coma for 6 days, numbness   Penicillins Other (See Comments)    BLISTERS BETWEEN FINGERS Did it involve swelling of the face/tongue/throat, SOB, or low BP? No Did it involve sudden or severe rash/hives, skin peeling, or any reaction on the inside of your mouth or nose? Yes Did you need to seek medical attention at a hospital or doctor's office? No When did it last happen?      60 years If all above answers are "NO", may proceed with cephalosporin use.    Pravastatin Sodium Other (See Comments)    Aches and pains in joints   Rosuvastatin Other (See Comments)    MYALGIAS   Niacin Anxiety and Other (See Comments)    "Makes me feel nervous, jittery"     SOCIAL HISTORY:  Social History   Socioeconomic History   Marital status: Married    Spouse name: Not on file   Number of children: 5   Years of education: Not on file   Highest education level: Not on file  Occupational History   Occupation: Realtor    Employer: Advertising copywriter  Tobacco Use   Smoking status: Never    Passive exposure: Past   Smokeless tobacco: Never  Vaping Use   Vaping Use: Never used  Substance and Sexual Activity   Alcohol use: No   Drug use: No    Sexual activity: Not Currently  Other Topics Concern   Not on file  Social History Narrative   Not on file   Social Determinants of Health   Financial Resource Strain: Low Risk  (04/02/2022)   Overall Financial Resource Strain (CARDIA)    Difficulty of Paying Living Expenses: Not hard at all  Food Insecurity: No Food Insecurity (12/27/2022)   Hunger Vital Sign    Worried About Running Out of Food in the Last Year: Never true    Ran Out of Food in the Last Year: Never true  Transportation Needs: No Transportation Needs (12/27/2022)   PRAPARE - Administrator, Civil Service (Medical): No    Lack of Transportation (Non-Medical): No  Physical Activity: Sufficiently Active (04/02/2022)   Exercise Vital Sign    Days of Exercise per Week: 5 days    Minutes of Exercise per Session: 30 min  Stress: No Stress Concern Present (04/02/2022)   [employer] of Occupational Health - Occupational Stress Questionnaire    Feeling of Stress : Not at all  Social Connections: Socially Integrated (04/02/2022)   Social Connection and Isolation Panel [NHANES]    Frequency of Communication with Friends and Family: More than three times a week    Frequency of Social Gatherings with Friends and Family: Once a week    Attends Religious Services: More than 4 times per year    Active Member of [employer] or Organizations: No    Attends Engineer, structural: More than 4 times per year    Marital Status: Married  Catering manager Violence: Not At  Risk (12/27/2022)   Humiliation, Afraid, Rape, and Kick questionnaire    Fear of Current or Ex-Partner: No    Emotionally Abused: No    Physically Abused: No    Sexually Abused: No     FAMILY HISTORY:  Family History  Problem Relation Age of Onset   Coronary artery disease Mother    Heart attack Brother    Stomach cancer Neg Hx    Colon cancer Neg Hx    Esophageal cancer Neg Hx    Pancreatic cancer Neg Hx    Prostate cancer Neg Hx    Rectal  cancer Neg Hx    Hypercalcemia Neg Hx       REVIEW OF SYSTEMS:  Review of Systems  Constitutional:  Negative for chills and fever.  HENT:  Negative for congestion and sore throat.   Respiratory:  Negative for shortness of breath.   Cardiovascular:  Positive for chest pain. Negative for palpitations.  Gastrointestinal:  Negative for abdominal pain, nausea and vomiting.  Genitourinary:  Negative for dysuria and urgency.  Musculoskeletal:  Positive for falls and joint pain.  Neurological:  Negative for dizziness, sensory change, speech change, focal weakness, loss of consciousness and headaches.  All other systems reviewed and are negative.   VITAL SIGNS:  Temp:  [98.2 F (36.8 C)-98.4 F (36.9 C)] 98.4 F (36.9 C) (02/02 0448) Pulse Rate:  [68-85] 70 (02/02 0448) Resp:  [13-21] 20 (02/02 0448) BP: (119-198)/(60-95) 119/64 (02/02 0448) SpO2:  [90 %-98 %] 93 % (02/02 0448) Weight:  [58.9 kg-59 kg] 58.9 kg (02/02 0500)     Height: 5\' 7"  (170.2 cm) Weight: 58.9 kg BMI (Calculated): 20.33   INTAKE/OUTPUT:  02/01 0701 - 02/02 0700 In: 360 [P.O.:240; I.V.:120] Out: 500 [Urine:500]  PHYSICAL EXAM:  Physical Exam Vitals and nursing note reviewed. Exam conducted with a chaperone present.  Constitutional:      General: He is not in acute distress.    Appearance: Normal appearance. He is not ill-appearing.  HENT:     Head: Normocephalic and atraumatic.      Comments: Ecchymosis to the right forehead Eyes:     General: No scleral icterus.    Extraocular Movements: Extraocular movements intact.     Conjunctiva/sclera: Conjunctivae normal.     Pupils: Pupils are equal, round, and reactive to light.  Cardiovascular:     Rate and Rhythm: Normal rate.     Pulses: Normal pulses.     Heart sounds: No murmur heard. Pulmonary:     Effort: Pulmonary effort is normal. No respiratory distress.     Breath sounds: Examination of the right-upper field reveals decreased breath sounds.  Decreased breath sounds present.     Comments: No respiratory distress, decreased breath sounds to the right upper field Chest:       Comments: Chest tube to the right lateral chest wall; this was unfortunately loosely sutured and retracted into the subcutaneous tissue; I did remove this at bedside given it was no longer in a functional position.  Genitourinary:    Comments: Deferred Musculoskeletal:     Right lower leg: No edema.     Left lower leg: No edema.  Skin:    General: Skin is warm and dry.     Coloration: Skin is not pale.     Findings: No erythema.  Neurological:     General: No focal deficit present.     Mental Status: He is alert and oriented to person, place, and time.  Psychiatric:        Mood and Affect: Mood normal.        Behavior: Behavior normal.      Labs:     Latest Ref Rng & Units 12/27/2022    3:30 AM 12/26/2022    6:24 PM 11/19/2022   10:43 PM  CBC  WBC 4.0 - 10.5 K/uL 9.7  10.8  7.6   Hemoglobin 13.0 - 17.0 g/dL 9.4  09.8  9.5   Hematocrit 39.0 - 52.0 % 29.0  33.9  29.6   Platelets 150 - 400 K/uL 158  184  136       Latest Ref Rng & Units 12/27/2022    3:30 AM 12/26/2022    6:24 PM 11/19/2022   10:43 PM  CMP  Glucose 70 - 99 mg/dL 119  147  829   BUN 8 - 23 mg/dL 21  23  22    Creatinine 0.61 - 1.24 mg/dL 5.62  1.30  8.65   Sodium 135 - 145 mmol/L 132  132  131   Potassium 3.5 - 5.1 mmol/L 4.3  4.4  4.5   Chloride 98 - 111 mmol/L 103  100  101   CO2 22 - 32 mmol/L 29  26  24    Calcium 8.9 - 10.3 mg/dL 78.4  69.6  9.9   Total Protein 6.5 - 8.1 g/dL 7.2     Total Bilirubin 0.3 - 1.2 mg/dL 1.0     Alkaline Phos 38 - 126 U/L 75     AST 15 - 41 U/L 24     ALT 0 - 44 U/L 10        Imaging studies:   CXR (12/26/2022 - 1900) personally reviewed with right PTX, and radiologist report reviewed below:  IMPRESSION: Large right-sided pneumothorax   CXR (12/26/2022 - 2100) personally reviewed with placement of chest tube; improvement in PTX, and  radiologist report reviewed below:  IMPRESSION: 1. Small right pneumothorax has significantly decreased in size status post right chest tube placement. 2. Residual patchy opacities in the right lung base.   CXR (12/27/2022 - 0800) personally reviewed with recurrence of right pneumothorax, retraction of chest tube into subcutaneous tissue, and radiologist report reviewed below:  IMPRESSION: 1. Progressed right pneumothorax which is now moderate to large. 2. Stable mild interstitial and airspace disease.   Assessment/Plan: (ICD-10's: J93.9) 89 y.o. male with traumatic right sided pneumothorax following mechanical fall, complicated by pertinent comorbidities including advanced age, need for anticoagulation.   - Unfortunately, chest tube had retracted into subcutaneous tissue this morning and right sided pneumothorax had recurred. I did remove chest tube at bedside and this was certainly retracted and no longer in a functional position. Occlusive dressing applied. Will discussed with interventional radiology regarding guided replacement of chest tube today   - He will need to be NPO pending need for procedure - Pain control prn - Continue pulmonary toilet; IS use - Likely need serial CXRs - Mobilize; therapy evaluations ordered  - Would hold anticoagulation this AM for potential procedure if feasible   - Further management per primary service    All of the above findings and recommendations were discussed with the patient and his family (wife on phone), and all of their questions were answered to their expressed satisfaction.  Thank you for the opportunity to participate in this patient's care.   -- Lynden Oxford, PA-C [facility] 12/27/2022, 7:23 AM M-F: 7am - 4pm

## 2022-12-27 NOTE — Progress Notes (Signed)
Prior to pt being received from ED, this RN and Agricultural consultant confirmed orders were placed for Chest Tube management. Confirmed with Dr. Posey Pronto -who confirmed with Dr. Christian Mate- that chest tube is to be at 20cm. Orders placed in Epic to reflect the same.   Orders received via chat and Epic, and pt accepted and transferred by ED RN. Pt's chest tube was clamped for transport. Upon arrival to room, suction was continued, maintained at 20cm, with water seal, and tube was unclamped. Verified continued management with Charge RN and day shift Therapist, sports. VSS. No pt complaints at this time.

## 2022-12-27 NOTE — Progress Notes (Signed)
Patient was seen earlier today for placement of a right-sided chest tube secondary to a previously placed chest tube becoming dislodged. Follow-up imaging indicated an improvement in the pneumothorax, but per a discussion with Dr Anselm Pancoast, it is felt that the current chest tube is malpositioned, possibly lying within the fissure of the right lung. It was felt that bringing the patient for a revision of the currently placed chest tube is appropriate to better facilitate treatment of the pneumothorax. Patient was seen again and informed of this, and he was agreeable to proceed. Consent was obtained through his wife Daren Yeagle because of the administration of sedation medication earlier today. Exam findings are consistent with exam performed earlier today by IR APP.   Risks and benefits of chest tube placement were discussed with the patient including bleeding, infection, damage to adjacent structures, malfunction of the tube requiring additional procedures and sepsis.  All of the patient's questions were answered, patient and his wife are agreeable to proceed. Consent signed and in IR control room.  Lura Em, PA-C 12/27/2022 3:13 PM

## 2022-12-27 NOTE — Assessment & Plan Note (Signed)
Mechanical fall. Fall precaution. PT and orthostatic when able prior to discharge.

## 2022-12-27 NOTE — Progress Notes (Signed)
Patient clinically stable post reposition of CT. Vitals stable. Received Fentanyl 25 mcg IV for procedure report given to Care nurse at bedside post procedure/206A.

## 2022-12-27 NOTE — Progress Notes (Signed)
[facility] Progress Note  [name]    XQJ:[id number]  DOA: 12/26/2022     Date of Service: the patient was seen and examined on 12/27/2022  Chief Complaint  Patient presents with   Fall   Brief hospital course: [name] Christensen is an 89 y.o. male brought to ed for He reports that he was helping his family members get something out of the trunk when he lost his balance and fell @ 10:30 am , striking his right hip and the right side of his chest on the ground, as well as his right side of his forehead Sustained trauma to forehead with scalp hematoma and no loc or vomiting. PT able to ambulate . Take eliquis for his a.fibrillation.  Patient does not have any dizziness intermittently or presyncopal type of symptoms.   Xray shows Large right-sided pneumothorax and pt had chest tube placed in ed     Assessment and Plan:  # Pneumothorax on right, s/p mechanical fall S/p chest tube insertion was done by ED physician, it got dislodged in the morning on 2/2 General surgery was consulted, chest tube was pulled out IR consulted to insert new chest tube 2/2 14 Fr tube placed in right pleural space. Majority of right pneumothorax resolved following tube placement.  Continue as needed medication for pain control Continue fall precautions Follow general surgery for chest tube management Held Eliquis, awaiting for general surgery clearance when to resume   CAD s/p CABG, hypertension Continue Lipitor, irbesartan, metoprolol. Monitor BP and titrate medications accordingly.   Persistent chronic A-fib,  Continued Tikosyn, Toprol-XL 50 mg twice daily Hold Eliquis for now, we will plan to resume once cleared by surgery  Hyponatremia, mild Na 132, monitor.  Anemia due to iron deficiency Iron level 20, transferrin saturation 7% Started Venofer 200 mg IV daily for 5 days Start oral iron supplement on discharge.  Follow-up with PCP to repeat iron profile after 3 to 6 months. Folic  acid level and K48 within normal range   Dementia and depression, continued Aricept, Lexapro home meds BPH, continued home meds.  Body mass index is 20.36 kg/m.  Interventions:      Diet: Heart healthy DVT Prophylaxis: SCD, pharmacological prophylaxis contraindicated due to risk of bleeding    Advance goals of care discussion: Full code  Family Communication: family was present at bedside, at the time of interview.  The pt provided permission to discuss medical plan with the family. Opportunity was given to ask question and all questions were answered satisfactorily.   Disposition:  Pt is from Home, admitted with fall and right pneumothorax s/p chest tube, still has chest tube, which precludes a safe discharge. Discharge to home, when cleared by general surgery may need 2 to 3 days stay in the hospital.  Subjective: No significant events overnight, patient was admitted with right-sided pneumothorax, chest tube was inserted in the ED with got dislodged.  Patient denied any worsening of shortness of breath, no chest pain or palpitation, was having right-sided chest wall tenderness.  Patient agreed with chest tube replacement by IR today.  All question and concerns answered.  Patient remained stable.  Physical Exam: General: NAD, lying comfortably Appear in no distress, affect appropriate Eyes: PERRLA ENT: Oral Mucosa Clear, moist  Neck: no JVD,  Cardiovascular: Irregular rhythm, no Murmur,  Respiratory: good respiratory effort, decreased breath sounds on the right side, no Crackles, no wheezes, mild right chest wall tenderness Abdomen: Bowel Sound present, Soft and no  tenderness,  Skin: no rashes Extremities: no Pedal edema, no calf tenderness Neurologic: without any new focal findings Gait not checked due to patient safety concerns  Vitals:   12/27/22 1200 12/27/22 1215 12/27/22 1230 12/27/22 1305  BP: 133/64 130/61 (!) 130/59 (!) 153/66  Pulse: 73 70 70 (!) 52  Resp: '14 13  14 16  '$ Temp:    98.1 F (36.7 C)  TempSrc:    Oral  SpO2: 90% 95% 93% 96%  Weight:      Height:        Intake/Output Summary (Last 24 hours) at 12/27/2022 1450 Last data filed at 12/27/2022 1400 Gross per 24 hour  Intake 563.33 ml  Output 640 ml  Net -76.67 ml   Filed Weights   12/26/22 1820 12/27/22 0500 12/27/22 1012  Weight: 59 kg 58.9 kg 59 kg    Data Reviewed: I have personally reviewed and interpreted daily labs, tele strips, imagings as discussed above. I reviewed all nursing notes, pharmacy notes, vitals, pertinent old records I have discussed plan of care as described above with RN and patient/family.  CBC: Recent Labs  Lab 12/26/22 1824 12/27/22 0330  WBC 10.8* 9.7  HGB 10.8* 9.4*  HCT 33.9* 29.0*  MCV 93.9 92.1  PLT 184 633   Basic Metabolic Panel: Recent Labs  Lab 12/26/22 1824 12/27/22 0330  NA 132* 132*  K 4.4 4.3  CL 100 103  CO2 26 29  GLUCOSE 114* 112*  BUN 23 21  CREATININE 1.13 1.09  CALCIUM 10.7* 10.1  MG  --  2.1  PHOS  --  2.7    Studies: CT [facility] PLEURAL DRAIN W/INDWELL CATH W/IMG GUIDE  Result Date: 12/27/2022 INDICATION: 89 year old with history of a traumatic pneumothorax. Previous small bore right chest tube has been removed and the patient has recurrent right pneumothorax. Patient needs a new chest tube. EXAM: CT-GUIDED PLACEMENT OF RIGHT CHEST TUBE MEDICATIONS: Moderate sedation ANESTHESIA/SEDATION: Moderate (conscious) sedation was employed during this procedure. A total of Versed 0.'5mg'$  and fentanyl 50 mcg was administered intravenously at the order of the provider performing the procedure. Total intra-service moderate sedation time: 12 minutes. Patient's level of consciousness and vital signs were monitored continuously by radiology nurse throughout the procedure under the supervision of the provider performing the procedure. COMPLICATIONS: None immediate. PROCEDURE: Informed written consent was obtained from the patient after a  thorough discussion of the procedural risks, benefits and alternatives. All questions were addressed. A timeout was performed prior to the initiation of the procedure. Patient was placed supine on the CT scanner with right side slightly elevated. CT images through the chest were obtained. Large right pneumothorax was obtained. The right mid axillary region was prepped with chlorhexidine and sterile field was created. Skin was anesthetized with 1% lidocaine. A small incision was made. Using CT guidance, a Yueh catheter was directed to the pleural space and air was aspirated. Superstiff Amplatz wire was placed and directed into the pleural space. The tract was dilated to accommodate a 14 [place] multipurpose drain. Chest tube was attached to a pleural fluid evacuation canister and attached to suction. Follow up CT images were obtained. Chest tube was slightly pulled back in order to improve suction. Follow up CT images were obtained. Chest tube was sutured to the skin. Bandages placed. RADIATION DOSE REDUCTION: This exam was performed according to the departmental dose-optimization program which includes automated exposure control, adjustment of the mA and/or kV according to patient size and/or use of iterative reconstruction  technique. FINDINGS: There was a very large right pneumothorax with compressive atelectasis in the right lower lobe and small right pleural effusion. Partial re-expansion of the right lung following chest tube placement. However, the chest tube appears to be extending into the right minor fissure. IMPRESSION: CT-guided placement of right chest tube. Right pneumothorax decreased in size following chest tube placement. Electronically Signed   By: Markus Daft M.D.   On: 12/27/2022 13:57   DG Chest Port 1 View  Result Date: 12/27/2022 CLINICAL DATA:  Status post chest tube placement. EXAM: PORTABLE CHEST 1 VIEW COMPARISON:  Chest radiograph 12/27/2022 at 0735 hours. FINDINGS: 1250 hours. Interval  placement of a right sided pleural drainage catheter with associated small amount of subcutaneous emphysema. Interval decrease in the size of the right-sided pneumothorax. Left chest AICD/biventricular pacer with leads projecting over the right atrium and right ventricle. Stable postoperative changes of median sternotomy and CABG. Stable prominent interstitial opacities and bibasilar atelectasis. IMPRESSION: Interval placement of a right-sided pleural drainage catheter with decreased size of the right pneumothorax. Electronically Signed   By: Emmit Alexanders M.D.   On: 12/27/2022 13:15   DG Chest 2 View  Result Date: 12/27/2022 CLINICAL DATA:  Pneumothorax on the right EXAM: CHEST - 2 VIEW COMPARISON:  Yesterday FINDINGS: Enlarged right pneumothorax now 6 cm in the apical midline, previously 1.2 cm. No midline shift. Generalized interstitial coarsening and mild reticulonodular airspace disease. Prior CABG. Dual-chamber ICD leads from the left. ASAP, these results will be called to the ordering clinician or representative by the Radiologist Assistant, and communication documented in the PACS or [employer]. A prelim was also sent in epic chat directly to the team. IMPRESSION: 1. Progressed right pneumothorax which is now moderate to large. 2. Stable mild interstitial and airspace disease. Electronically Signed   By: Jorje Guild M.D.   On: 12/27/2022 07:54   DG Chest Portable 1 View  Result Date: 12/26/2022 CLINICAL DATA:  Chest tube placement EXAM: PORTABLE CHEST 1 VIEW COMPARISON:  Chest x-ray earlier same day FINDINGS: There is a new right-sided chest tube in place with distal tip projecting over the upper right lung. Small right pneumothorax is present which has significantly decreased in size now measuring 13 mm from the lung apex. There some residual patchy opacities in the right lung base. Patient is status post cardiac surgery. The heart is enlarged, unchanged. No mediastinal shift or pleural  effusion. No acute fractures. IMPRESSION: 1. Small right pneumothorax has significantly decreased in size status post right chest tube placement. 2. Residual patchy opacities in the right lung base. Electronically Signed   By: Ronney Asters M.D.   On: 12/26/2022 20:58   DG Shoulder Right  Result Date: 12/26/2022 CLINICAL DATA:  Fall, pain EXAM: RIGHT SHOULDER - 2+ VIEW COMPARISON:  Chest radiograph dated 12/26/2022 FINDINGS: No fracture or dislocation of the shoulder. The visualized soft tissues are unremarkable. Large right pneumothorax, previously noted on chest radiograph. Associated nondisplaced fractures of the right anterior 4th and 5th ribs. IMPRESSION: Large right pneumothorax, previously noted on chest radiograph. Associated nondisplaced fractures of the right anterior 4th and 5th ribs. Electronically Signed   By: Julian Hy M.D.   On: 12/26/2022 19:23   CT Head Wo Contrast  Result Date: 12/26/2022 CLINICAL DATA:  Fall, right forehead hematoma EXAM: CT HEAD WITHOUT CONTRAST CT CERVICAL SPINE WITHOUT CONTRAST TECHNIQUE: Multidetector CT imaging of the head and cervical spine was performed following the standard protocol without intravenous contrast. Multiplanar CT  image reconstructions of the cervical spine were also generated. RADIATION DOSE REDUCTION: This exam was performed according to the departmental dose-optimization program which includes automated exposure control, adjustment of the mA and/or kV according to patient size and/or use of iterative reconstruction technique. COMPARISON:  11/20/2022 FINDINGS: CT HEAD FINDINGS Brain: No evidence of acute infarction, hemorrhage, hydrocephalus, extra-axial collection or mass lesion/mass effect. Subcortical white matter and periventricular small vessel ischemic changes. Vascular: Intracranial atherosclerosis. Skull: Normal. Negative for fracture or focal lesion. Old right maxillary sinus fractures. Sinuses/Orbits: The visualized paranasal sinuses  are essentially clear. The mastoid air cells are unopacified. Other: Soft tissue swelling/hematoma overlying the lateral right frontal bone (series 2/image 22). CT CERVICAL SPINE FINDINGS Alignment: Normal cervical lordosis. Skull base and vertebrae: No acute fracture. No primary bone lesion or focal pathologic process. Soft tissues and spinal canal: No prevertebral fluid or swelling. No visible canal hematoma. Disc levels: Mild degenerative changes at C4-5. Spinal canal is patent. Upper chest: Large right pneumothorax, noted on chest radiograph. Other: None. IMPRESSION: Soft tissue swelling/hematoma overlying the lateral right frontal bone. No evidence of calvarial fracture. No evidence of acute intracranial abnormality. Small vessel ischemic changes. No traumatic injury to the cervical spine. Large right pneumothorax, noted on chest radiograph. Electronically Signed   By: Julian Hy M.D.   On: 12/26/2022 19:20   CT Cervical Spine Wo Contrast  Result Date: 12/26/2022 CLINICAL DATA:  Fall, right forehead hematoma EXAM: CT HEAD WITHOUT CONTRAST CT CERVICAL SPINE WITHOUT CONTRAST TECHNIQUE: Multidetector CT imaging of the head and cervical spine was performed following the standard protocol without intravenous contrast. Multiplanar CT image reconstructions of the cervical spine were also generated. RADIATION DOSE REDUCTION: This exam was performed according to the departmental dose-optimization program which includes automated exposure control, adjustment of the mA and/or kV according to patient size and/or use of iterative reconstruction technique. COMPARISON:  11/20/2022 FINDINGS: CT HEAD FINDINGS Brain: No evidence of acute infarction, hemorrhage, hydrocephalus, extra-axial collection or mass lesion/mass effect. Subcortical white matter and periventricular small vessel ischemic changes. Vascular: Intracranial atherosclerosis. Skull: Normal. Negative for fracture or focal lesion. Old right maxillary sinus  fractures. Sinuses/Orbits: The visualized paranasal sinuses are essentially clear. The mastoid air cells are unopacified. Other: Soft tissue swelling/hematoma overlying the lateral right frontal bone (series 2/image 22). CT CERVICAL SPINE FINDINGS Alignment: Normal cervical lordosis. Skull base and vertebrae: No acute fracture. No primary bone lesion or focal pathologic process. Soft tissues and spinal canal: No prevertebral fluid or swelling. No visible canal hematoma. Disc levels: Mild degenerative changes at C4-5. Spinal canal is patent. Upper chest: Large right pneumothorax, noted on chest radiograph. Other: None. IMPRESSION: Soft tissue swelling/hematoma overlying the lateral right frontal bone. No evidence of calvarial fracture. No evidence of acute intracranial abnormality. Small vessel ischemic changes. No traumatic injury to the cervical spine. Large right pneumothorax, noted on chest radiograph. Electronically Signed   By: Julian Hy M.D.   On: 12/26/2022 19:20   DG Hip Unilat W or Wo Pelvis 2-3 Views Right  Result Date: 12/26/2022 CLINICAL DATA:  Fall EXAM: DG HIP (WITH OR WITHOUT PELVIS) 2-3V RIGHT COMPARISON:  None Available. FINDINGS: No fracture or dislocation is seen. Mild degenerative changes of the right hip. Left hip joint space is preserved. Visualized bony pelvis appears intact. Degenerative changes of the lower lumbar spine. IMPRESSION: Negative. Electronically Signed   By: Julian Hy M.D.   On: 12/26/2022 19:16   DG Chest 1 View  Result Date: 12/26/2022 CLINICAL DATA:  Recent fall with chest pain, initial encounter EXAM: PORTABLE CHEST 1 VIEW COMPARISON:  11/19/2022 FINDINGS: Cardiac shadow is within normal limits. Defibrillator is again seen. Aortic calcifications are noted. Left lung is clear. Right lung demonstrates a large pneumothorax with approximately 4.6 cm excursion at the apex. No definitive rib fracture is noted. IMPRESSION: Large right-sided pneumothorax  Critical Value/emergent results were called by telephone at the time of interpretation on 12/26/2022 at 7:06 pm to Dr. Lavonia Drafts , who verbally acknowledged these results. Electronically Signed   By: [name] M.D.   On: 12/26/2022 19:07    Scheduled Meds:  alfuzosin  10 mg Oral QPM   atorvastatin  20 mg Oral Daily   cinacalcet  30 mg Oral Once per day on Mon Wed Fri   dofetilide  250 mcg Oral BID   donepezil  5 mg Oral QHS   dutasteride  0.5 mg Oral QPM   escitalopram  10 mg Oral QHS   famotidine  20 mg Oral Daily   fentaNYL       irbesartan  37.5 mg Oral Daily   metoprolol succinate  50 mg Oral Daily   midazolam       sodium chloride flush  3 mL Intravenous Q12H   Continuous Infusions:  sodium chloride 1,000 mL (12/27/22 1020)   PRN Meds: acetaminophen **OR** acetaminophen, albuterol, fentaNYL, HYDROcodone bit-homatropine, HYDROcodone-acetaminophen, midazolam, morphine injection, nitroGLYCERIN  Time spent: 35 minutes  Author: Val Riles. MD [facility] 12/27/2022 2:50 PM  To reach On-call, see care teams to locate the attending and reach out to them via [email or web address]. If 7PM-7AM, please contact night-coverage If you still have difficulty reaching the attending provider, please page the [facility] (Director on Call) for [facility] on amion for assistance.

## 2022-12-27 NOTE — Assessment & Plan Note (Signed)
S/p chest tube placed with water seal at 20 cm with repeat chest xray in am.  Additional orders per general surgery.

## 2022-12-27 NOTE — Procedures (Signed)
Interventional Radiology Procedure:   Indications: Recently placed right chest tube needs repositioning.  Procedure: Exchange and repositioning of right chest tube with fluoroscopy  Findings: Old right chest tube was within the right minor fissure.  New 14 Fr tube placed in upper chest pleural space near lung apex.    Complications: No immediate complications noted.     EBL: Minimal  Plan: Continue with wall suction of right chest tube.    Charlestine Rookstool R. Anselm Pancoast, MD  Pager: [phone]

## 2022-12-27 NOTE — Assessment & Plan Note (Addendum)
C/h anemia , h/h is stable. Type/ screen. Follow cbc. Defer to PCP for additional eval as pt is on blood thinner.    Most Recent 1 d ago 1 mo ago 2 mo ago 4 mo ago 4 mo ago 5 mo ago 1 yr ago  Cbc  Hemoglobin 10.8 Low  (1 d ago) 10.8 Low  9.5 Low  10.8 Low  12.6 Low  11.6 Low  11.1 Low  11.7 Low   HCT 33.9 Low  (1 d ago) 33.9 Low  29.6 Low  32.6 Low  37.0 Low  35.4 Low  33.7 Low  35.4 Low

## 2022-12-27 NOTE — Assessment & Plan Note (Signed)
F/u by urology. Cont avodart and uroxatral. Orthostatic Vitals prior to discharge.

## 2022-12-27 NOTE — Assessment & Plan Note (Addendum)
Stable no anginal symptoms.  We will hold eliquis tonight. Cont toprol. Cont ntg prn.  Resume cardiac meds once med rec is available.

## 2022-12-27 NOTE — Assessment & Plan Note (Signed)
Stable. No edema/ or SOB.

## 2022-12-27 NOTE — Procedures (Signed)
Interventional Radiology Procedure:   Indications: Right pneumothorax, prior chest tube was dislodged.  Procedure: CT guided right chest tube placement  Findings: Large right pneumothorax. 14 Fr tube placed in right pleural space.  Majority of right pneumothorax resolved following tube placement.  Complications: No immediate complications noted.     EBL: Minimal  Plan: Chest tube to wall suction.   CXR pending.    Sabre Leonetti R. Anselm Pancoast, MD  Pager: [phone]

## 2022-12-27 NOTE — Evaluation (Signed)
Occupational Therapy Evaluation Patient Details Name: [name] Christensen MRN: [id number] DOB: Feb 26, 1932 Today's Date: 12/27/2022   History of Present Illness Pt is an 89 year old male male who presented to [facility] ED 12/26/22 following a fall.  Patient reports falling backwards onto his right side striking his right hip, chest and head while unloading the truck of a car.  Workup in the ED revealed normal head/neck CT however CXR revealed right-sided pneumothorax.  EDP placed right chest tube at the bedside and patient was admitted.  Repeat CRX this morning showed recurrence of right pneumothorax with right chest tube retracted into the subcutaneous tissue, chest tube placed again on 12/27/22; Pt with Associated nondisplaced fractures of the right anterior 4th and 5th ribs; PMH significant for   eart failure, presence of pacemaker, atrial fibrillation on anticoagulation   Clinical Impression   Chart reviewed, pt greeted in room agreeable to OT evaluation. Co tx completed with PT on this date. Pt is alert and oriented x4, HOH, appropriate participation throughout. PTA pt is generally MOD I in ADL/IADL, amb with RW as needed. Pt presents with deficits in activity tolerance, strength, balance all affecting safe and optimal ADL completion. Pt is mildly ain limited on this date, reporting pain in R chest area (recent chest tube placement, also with broken ribs). Bed mobility completed with MOD A, STS with CGA, anticipate LB dressing with MIN A, amb in room with RW with CGA. Pt performs grooming tasks in seated with set up. Education provided re: energy conservation techniques, activity modification in setting of acute illness. Recommend HHOT following discharge to address functional deficits. OT will continue to follow acutely.      Recommendations for follow up therapy are one component of a multi-disciplinary discharge planning process, led by the attending physician.  Recommendations may be updated based on patient  status, additional functional criteria and insurance authorization.   Follow Up Recommendations  Home health OT     Assistance Recommended at Discharge Intermittent Supervision/Assistance  Patient can return home with the following A little help with walking and/or transfers;A little help with bathing/dressing/bathroom    Functional Status Assessment  Patient has had a recent decline in their functional status and demonstrates the ability to make significant improvements in function in a reasonable and predictable amount of time.  Equipment Recommendations  BSC/3in1    Recommendations for Other Services       Precautions / Restrictions Precautions Precautions: Fall      Mobility Bed Mobility Overal bed mobility: Needs Assistance Bed Mobility: Supine to Sit     Supine to sit: HOB elevated, Min assist, Mod assist     General bed mobility comments: frequent vcs for technique    Transfers Overall transfer level: Needs assistance Equipment used: Rolling walker (2 wheels) Transfers: Sit to/from Stand Sit to Stand: Min guard                  Balance Overall balance assessment: Needs assistance Sitting-balance support: Feet supported Sitting balance-Leahy Scale: Good     Standing balance support: Bilateral upper extremity supported, Reliant on assistive device for balance Standing balance-Leahy Scale: Fair                             ADL either performed or assessed with clinical judgement   ADL Overall ADL's : Needs assistance/impaired Eating/Feeding: Set up;Sitting   Grooming: Wash/dry face;Oral care;Sitting;Set up  Upper Body Dressing : Minimal assistance;Sitting Upper Body Dressing Details (indicate cue type and reason): gown Lower Body Dressing: Minimal assistance Lower Body Dressing Details (indicate cue type and reason): socks, anticipated Toilet Transfer: Min guard;Ambulation;Rolling walker (2 wheels) Toilet Transfer  Details (indicate cue type and reason): simulated         Functional mobility during ADLs: Min guard;Rolling walker (2 wheels) (household distances in room)       Vision Baseline Vision/History: 1 Wears glasses Patient Visual Report: No change from baseline       Perception     Praxis      Pertinent Vitals/Pain Pain Assessment Pain Assessment: 0-10 Pain Score: 6  Pain Location: R sided chest wall Pain Descriptors / Indicators: Discomfort Pain Intervention(s): Limited activity within patient's tolerance, Monitored during session, Premedicated before session     Hand Dominance     Extremity/Trunk Assessment Upper Extremity Assessment Upper Extremity Assessment: RUE deficits/detail;LUE deficits/detail RUE Deficits / Details: RUE shoulder flexion AROM to approx 1/4 full AROM, elbow/wrist appear WFL; Pt reports ROM is pain limited LUE Deficits / Details: RUE shoulder flexion AROM to approx 1/4 full AROM, elbow/wrist appear WFL; Pt reports ROM is pain limited   Lower Extremity Assessment Lower Extremity Assessment: Defer to PT evaluation       Communication Communication Communication: No difficulties   Cognition Arousal/Alertness: Awake/alert Behavior During Therapy: WFL for tasks assessed/performed Overall Cognitive Status: Within Functional Limits for tasks assessed                                       General Comments  spo2 down to 88% on RA with mobility, recovered to >90% on RA with rest; all other vitals appear WFL; all lines, leads, chest tube to suction intact pre/post session    Exercises Other Exercises Other Exercises: edu re: role of OT, role of rehab, discharge recommendations, home safety, falls prevention, DME use   Shoulder Instructions      Home Living Family/patient expects to be discharged to:: Private residence Living Arrangements: Spouse/significant other Available Help at Discharge: Family;Available 24 hours/day Type of  Home: House Home Access: Stairs to enter     Home Layout: Two level;Able to live on main level with bedroom/bathroom     Bathroom Shower/Tub: Hospital doctor Toilet: Handicapped height     Home Equipment: Conservation officer, nature (2 wheels);Shower seat - built in          Prior Functioning/Environment Prior Level of Function : Driving;Independent/Modified Independent;History of Falls (last six months)             Mobility Comments: PRN use of RW ADLs Comments: MOD I-I in ADL/IADL        OT Problem List: Decreased activity tolerance;Decreased knowledge of use of DME or AE;Impaired balance (sitting and/or standing)      OT Treatment/Interventions: Self-care/ADL training;DME and/or AE instruction;Therapeutic activities;Balance training;Therapeutic exercise;Energy conservation;Patient/family education    OT Goals(Current goals can be found in the care plan section) Acute Rehab OT Goals Patient Stated Goal: return to PLOF OT Goal Formulation: With patient Time For Goal Achievement: 01/10/23 Potential to Achieve Goals: Good ADL Goals Pt Will Perform Grooming: with modified independence;sitting;standing Pt Will Perform Lower Body Dressing: with modified independence;sit to/from stand Pt Will Transfer to Toilet: with modified independence;ambulating Pt Will Perform Toileting - Clothing Manipulation and hygiene: with modified independence;sit to/from stand  OT Frequency:  Min 2X/week    Co-evaluation PT/OT/SLP Co-Evaluation/Treatment: Yes Reason for Co-Treatment: Complexity of the patient's impairments (multi-system involvement);To address functional/ADL transfers   OT goals addressed during session: ADL's and self-care      AM-PAC OT "6 Clicks" Daily Activity     Outcome Measure Help from another person eating meals?: None Help from another person taking care of personal grooming?: None Help from another person toileting, which includes using toliet, bedpan, or  urinal?: A Little Help from another person bathing (including washing, rinsing, drying)?: A Little Help from another person to put on and taking off regular upper body clothing?: None Help from another person to put on and taking off regular lower body clothing?: A Little 6 Click Score: 21   End of Session Equipment Utilized During Treatment: Rolling walker (2 wheels) Nurse Communication: Mobility status  Activity Tolerance: Patient tolerated treatment well Patient left: in chair;with call bell/phone within reach;with chair alarm set  OT Visit Diagnosis: Unsteadiness on feet (R26.81);Repeated falls (R29.6)                Time: 1610-9604 OT Time Calculation (min): 26 min Charges:  OT General Charges $OT Visit: 1 Visit OT Evaluation $OT Eval Moderate Complexity: 1 Mod  Shanon Payor, OTD OTR/L  12/27/22, 2:59 PM

## 2022-12-27 NOTE — Assessment & Plan Note (Signed)
Cont toprol.  Med rec pending for tikosyn and other meds.  EKG shows atrial paced rhythm.

## 2022-12-27 NOTE — Progress Notes (Signed)
OT Cancellation Note  Patient Details Name: [name] Christensen MRN: [id number] DOB: 27-Oct-1932   Cancelled Treatment:    Reason Eval/Treat Not Completed: Other (comment) (per nurse, pt off the floor forIR procedure. OT will reattempt as able.Shanon Payor, OTD OTR/L  12/27/22, 12:38 PM

## 2022-12-27 NOTE — Consult Note (Signed)
Chief Complaint: Patient was seen in consultation today for traumatic right pneumothorax at the request of Lynden Oxford Referring Physician(s): Lynden Oxford  Supervising Physician: Richarda Overlie  Patient Status: [facility] - In-pt  History of Present Illness: [name] Christensen is a 89 y.o. male who presented to [facility] ED 12/26/22 following a fall.  Patient reports falling backwards onto his right side striking his right hip, chest and head while unloading the truck of a car.  Workup in the ED revealed normal head/neck CT however CXR revealed right-sided pneumothorax.  EDP placed right chest tube at the bedside and patient was admitted.  Repeat CRX this morning showed recurrence of right pneumothorax with right chest tube retracted into the subcutaneous tissue. Patient was referred to IR for replacement of dislodged right chest tube. Imaging was reviewed by Dr. Lowella Dandy and approved.  Procedure tentatively scheduled for today.  Pt denies CP or SOB this morning.  Patient reports having clear fluids around 6 AM this morning.  He states he has not eaten since yesterday.  Past Medical History:  Diagnosis Date   Actinic keratosis 06/09/2019   L sup forehead at hairline - bx proven   AICD (automatic cardioverter/defibrillator) present 04/07/2017   Allergy    Anxiety    BPH (benign prostatic hypertrophy)    elevated PSA Dr. Lindell Noe Bx 2010   CAP (community acquired pneumonia) 11/22/2012   rx levaquin 12/14/12  - esr 51 12/31/2012 and no eos  05/2021 likely a COVID complication.  Prescribed cefdinir antibiotic, cough syrup with codeine, Medrol Dosepak  1/23 CAP --sx's - 80+% better. CXR: COPD changes with chronic basilar interstitial disease and suspect  superimposed acute infiltrate at RIGHT base.   Cardiac arrest [facility])    a. out-of-hospital arrest 11/18/2012 - EF 40-45%, patent grafts on cath, received [employer] AICD.   Carotid disease, bilateral (HCC)    a. 0-39% by doppers.   Cataract    bil  cataracts removed   Chest pain, atypical 07/21/2013   CHF (congestive heart failure) (HCC)    Chills with fever 11/24/2017   12/18 Recurrent - ?prostatitis  1/19 ?URI  6/20    Chronic calculous cholecystitis 06/05/2017   Chronic diastolic CHF (congestive heart failure) (HCC) 03/09/2018   10/19 Probable pacemaker cardiomyopathy per Dr Graciela Husbands w/a plan to   1) INCREASE cozaar (losartan) 50 mg - take 1 tablet by mouth once daily - d/c. Losartan was stopped on 05/12/19 due to high K  2) START mexiletine 150 mg- take 1 tablet by mouth TWICE daily   Coronary artery disease    a. s/p MI/CABG 2005. b. s/p cath at time of VF arrest 10/2013 - grafts patent.   Cough    Diverticulosis    Elevated LFTs    a. 10/2012 felt due to cardiac arrest - Hepatitis C Ab reactive from 11/14/2012>>Hep C RNA PCR negative 10/27/2012.    GERD (gastroesophageal reflux disease)    Hyperlipidemia    Hypertension    Internal hemorrhoids    Myocardial infarction (HCC)    2005 - CABG x 5 2005   Paroxysmal atrial fibrillation (HCC)    Continue on Eliquis, Toprol, Tikosyn  Dr Graciela Husbands   RBBB    Tubular adenoma of colon    Unstable angina (HCC)    Upper airway cough syndrome 03/26/2022   Onset ? Jan 2023   - try off Entresto  03/25/2022   - max gerd rx 08/01/2022 >>>       Ventricular  bigeminy    a. Event monitor 01/2013: NSR with PVCs and occ bigeminy.   Weakness 04/09/2016   5/17 ?post-viral vs other, 3/18, 6/20, 1/23  CFS  He is very active for his age  23/23  Post-concussion  Take Lexapro and Toprol qhs  Reduce Toprol to 1 qhs    Past Surgical History:  Procedure Laterality Date   APPENDECTOMY     CARDIAC CATHETERIZATION  2007   with patent graft anatomy atretic left internal mammary  artery to the LAD which is nonobstructive. Will restart study June 08, 2007   CARDIOVERSION N/A 03/27/2018   Procedure: CARDIOVERSION;  Surgeon: Iran Ouch, MD;  Location: [facility] ORS;  Service: Cardiovascular;  Laterality: N/A;    CARDIOVERSION N/A 01/04/2019   Procedure: CARDIOVERSION (CATH LAB);  Surgeon: Iran Ouch, MD;  Location: [facility] ORS;  Service: Cardiovascular;  Laterality: N/A;   CARDIOVERSION N/A 07/07/2019   Procedure: CARDIOVERSION;  Surgeon: Wendall Stade, MD;  Location: [facility] ENDOSCOPY;  Service: Cardiovascular;  Laterality: N/A;   CATARACT EXTRACTION W/PHACO Right 04/23/2016   Procedure: CATARACT EXTRACTION PHACO AND INTRAOCULAR LENS PLACEMENT (IOC);  Surgeon: [name], MD;  Location: [facility] ORS;  Service: Ophthalmology;  Laterality: Right;  [place] 1.09 AP% 19.3 CDE 13.38 Fluid pack lot # [id number]   CHOLECYSTECTOMY N/A 06/05/2017   Procedure: LAPAROSCOPIC CHOLECYSTECTOMY WITH INTRAOPERATIVE CHOLANGIOGRAM ERAS PATHWAY POSSIBLE NEEDLE CORE BIOPSY OF LIVER;  Surgeon: [name], MD;  Location: [facility] OR;  Service: General;  Laterality: N/A;  ERAS PATHWAY   COLONOSCOPY     CORONARY ARTERY BYPASS GRAFT  2005   EYE SURGERY     ICD GENERATOR CHANGEOUT N/A 04/07/2017   Procedure: ICD Generator Changeout;  Surgeon: Duke Salvia, MD;  Location: [facility] INVASIVE CV LAB;  Service: Cardiovascular;  Laterality: N/A;   IMPLANTABLE CARDIOVERTER DEFIBRILLATOR IMPLANT N/A 11/16/2012   STJ single chamber ICD implanted by Dr Graciela Husbands for cardiac arrest    INGUINAL HERNIA REPAIR Bilateral 01/17/2015   Procedure: BILATERAL LAPAROSCOPIC INGUINAL HERNIA REPAIR WITH LEFT FEMORAL HERNIA REPAIR;  Surgeon: [name], MD;  Location: [facility] OR;  Service: General;  Laterality: Bilateral;   INSERTION OF MESH Bilateral 01/17/2015   Procedure: INSERTION OF MESH;  Surgeon: [name], MD;  Location: [facility] OR;  Service: General;  Laterality: Bilateral;   LAPAROSCOPIC CHOLECYSTECTOMY  06/05/2017   LEAD INSERTION N/A 04/07/2017   Procedure: RA Lead Insertion;  Surgeon: Duke Salvia, MD;  Location: [facility] INVASIVE CV LAB;  Service: Cardiovascular;  Laterality: N/A;   LEAD REVISION/REPAIR N/A 04/08/2017   Procedure: Atrial Lead Revision/Repair;   Surgeon: Duke Salvia, MD;  Location: [facility] INVASIVE CV LAB;  Service: Cardiovascular;  Laterality: N/A;   LEFT HEART CATH AND CORONARY ANGIOGRAPHY Left 05/14/2019   Procedure: LEFT HEART CATH AND CORONARY ANGIOGRAPHY;  Surgeon: Iran Ouch, MD;  Location: [facility] INVASIVE CV LAB;  Service: Cardiovascular;  Laterality: Left;   LEFT HEART CATHETERIZATION WITH CORONARY/GRAFT ANGIOGRAM N/A 10/29/2012   Procedure: LEFT HEART CATHETERIZATION WITH Isabel Caprice;  Surgeon: [name], MD;  Location: [facility] CATH LAB;  Service: Cardiovascular;  Laterality: N/A;   LIVER BIOPSY N/A 06/05/2017   Procedure: SINGLE SITE LIVER BIOPSY ERAS PATHWAY;  Surgeon: [name], MD;  Location: [facility] OR;  Service: General;  Laterality: N/A;  ERAS PATHWAY   LUMBAR FUSION  09/2007    Allergies: Ezetimibe, Penicillins, Pravastatin sodium, Rosuvastatin, and Niacin  Medications: Prior to Admission medications   Medication Sig Start Date End Date Taking?  Authorizing Provider  acetaminophen (TYLENOL) 500 MG tablet Take 500 mg by mouth at bedtime.    [provider]  alfuzosin (UROXATRAL) 10 MG 24 hr tablet Take 10 mg by mouth every evening.     [provider]  atorvastatin (LIPITOR) 40 MG tablet Take 20 mg by mouth daily. 03/20/22   [provider]  benzonatate (TESSALON) 100 MG capsule Take 100 mg by mouth 3 (three) times daily as needed for cough.    [provider]  betamethasone dipropionate 0.05 % cream Apply 1 application. topically 2 (two) times daily as needed.    [provider]  cinacalcet (SENSIPAR) 30 MG tablet Take 1 tablet (30 mg total) by mouth 3 (three) times a week. 08/28/22   Reather Littler, MD  Dextromethorphan-guaiFENesin [facility] DM PO) Take 30 mg by mouth in the morning and at bedtime.    [provider]  dofetilide (TIKOSYN) 250 MCG capsule TAKE 1 CAPSULE BY MOUTH TWICE A DAY 11/21/22   Duke Salvia, MD  donepezil (ARICEPT) 5 MG tablet  Take 1 tablet (5 mg total) by mouth at bedtime. 07/03/22   Plotnikov, Georgina Quint, MD  dutasteride (AVODART) 0.5 MG capsule Take 0.5 mg by mouth every evening.     [provider]  ELIQUIS 5 MG TABS tablet TAKE 1 TABLET BY MOUTH TWICE (2) DAILY Patient taking differently: Take 5 mg by mouth 2 (two) times daily. 09/10/21   Plotnikov, Georgina Quint, MD  escitalopram (LEXAPRO) 10 MG tablet Take 1 tablet (10 mg total) by mouth at bedtime. 08/19/22   Plotnikov, Georgina Quint, MD  famotidine (PEPCID) 20 MG tablet Take 20 mg by mouth daily.    [provider]  guaiFENesin (MUCINEX) 600 MG 12 hr tablet Take 600 mg by mouth as directed. 10/08/22   [provider]  HYDROcodone bit-homatropine (HYCODAN) 5-1.5 MG/5ML syrup TAKE 5 MLS BY MOUTH EVERY 6 HOURS AS NEEDED FOR COUGH Patient taking differently: Take 5 mLs by mouth every 6 (six) hours as needed for cough. 09/19/22   Plotnikov, Georgina Quint, MD  metoprolol succinate (TOPROL-XL) 50 MG 24 hr tablet TAKE 1 TABLET BY MOUTH TWICE A DAY. TAKEWITH OR IMMEDIATELY FOLLOWING A MEAL Patient taking differently: Take 50 mg by mouth daily. TAKE 1 TABLET BY MOUTH TWICE A DAY. TAKEWITH OR IMMEDIATELY FOLLOWING A MEAL 08/27/22   Plotnikov, Georgina Quint, MD  Multiple Vitamin (MULTIVITAMIN WITH MINERALS) TABS tablet Take 1 tablet by mouth every other day.    [provider]  nitroGLYCERIN (NITROSTAT) 0.4 MG SL tablet DISSOLVE 1 TABLET UNDER TONGUE AS NEEDEDFOR CHEST PAIN. MAY REPEAT 5 MINUTES APART 3 TIMES IF NEEDED 05/30/21   Plotnikov, Georgina Quint, MD  omeprazole (PRILOSEC) 20 MG capsule Take 20 mg by mouth daily.    [provider]  Plecanatide 3 MG TABS Take 3 mg by mouth daily. Replaces (Linaclotide) Linzess 03/25/22   [provider]  Polyethyl Glycol-Propyl Glycol (SYSTANE OP) Place 1 drop into both eyes daily as needed (burning eyes).     [provider]  triamcinolone cream (KENALOG) 0.1 % APPLY 1 APPLICATION TOPICALLY TWO TIMES  DAILY AS NEEDED AVOIDING FACE, GROIN ANDARMPITS Patient taking differently: Apply 1 Application topically 2 (two) times daily. 03/13/22   Deirdre Evener, MD  valsartan (DIOVAN) 80 MG tablet Take 1 tablet (80 mg total) by mouth daily. Take 1 tablet (80 mg) by mouth once daily 11/24/22   Almon Hercules, MD  vitamin B-12 (CYANOCOBALAMIN) 1000  MCG tablet Take 1,000 mcg by mouth daily.    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  Tobacco Use   Smoking status: Never    Passive exposure: Past   Smokeless tobacco: Never  Vaping Use   Vaping Use: Never used  Substance and Sexual Activity   Alcohol use: No   Drug use: No   Sexual activity: Not Currently  Other Topics Concern   Not on file  Social History Narrative   Not on file   Social Determinants of Health   Financial Resource Strain: Low Risk  (04/02/2022)   Overall Financial Resource Strain (CARDIA)    Difficulty of Paying Living Expenses: Not hard at all  Food Insecurity: No Food Insecurity (12/27/2022)   Hunger Vital Sign    Worried About Running Out of Food in the Last Year: Never true    Ran Out of Food in the Last Year: Never true  Transportation Needs: No Transportation Needs (12/27/2022)   PRAPARE - Administrator, Civil Service (Medical): No    Lack of Transportation (Non-Medical): No  Physical Activity: Sufficiently Active (04/02/2022)   Exercise Vital Sign    Days of  Exercise per Week: 5 days    Minutes of Exercise per Session: 30 min  Stress: No Stress Concern Present (04/02/2022)   [employer] of Occupational Health - Occupational Stress Questionnaire    Feeling of Stress : Not at all  Social Connections: Socially Integrated (04/02/2022)   Social Connection and Isolation Panel [NHANES]    Frequency of Communication with Friends and Family: More than three times a week    Frequency of Social Gatherings with Friends and Family: Once a week    Attends Religious Services: More than 4 times per year    Active Member of [employer] or Organizations: No    Attends Engineer, structural: More than 4 times per year    Marital Status: Married    Review of Systems: A 12 point ROS discussed and pertinent positives are indicated in the HPI above.  All other systems are negative.  Review of Systems  All other systems reviewed and are negative.   Vital Signs: BP 120/76 (BP Location: Left Arm)   Pulse 72   Temp 98.5 F (36.9 C) (Oral)   Resp 20   Ht 5\' 7"  (1.702 m)   Wt 129 lb 13.6 oz (58.9 kg)   SpO2 94%   BMI 20.34 kg/m     Physical Exam Vitals reviewed.  Constitutional:      General: He is not in acute distress.    Appearance: Normal appearance. He is not ill-appearing.  Cardiovascular:     Rate and Rhythm: Normal rate and regular rhythm.     Pulses: Normal  pulses.     Heart sounds: Normal heart sounds. No murmur heard. Pulmonary:     Effort: Pulmonary effort is normal. No respiratory distress.     Comments: Decreased sounds on R Skin:    General: Skin is warm and dry.  Neurological:     Mental Status: He is alert and oriented to person, place, and time.  Psychiatric:        Mood and Affect: Mood normal.        Behavior: Behavior normal.        Thought Content: Thought content normal.        Judgment: Judgment normal.     Imaging: DG Chest 2 View  Result Date: 12/27/2022 CLINICAL DATA:  Pneumothorax on the right EXAM: CHEST -  2 VIEW COMPARISON:  Yesterday FINDINGS: Enlarged right pneumothorax now 6 cm in the apical midline, previously 1.2 cm. No midline shift. Generalized interstitial coarsening and mild reticulonodular airspace disease. Prior CABG. Dual-chamber ICD leads from the left. ASAP, these results will be called to the ordering clinician or representative by the Radiologist Assistant, and communication documented in the PACS or [employer]. A prelim was also sent in epic chat directly to the team. IMPRESSION: 1. Progressed right pneumothorax which is now moderate to large. 2. Stable mild interstitial and airspace disease. Electronically Signed   By: Tiburcio Pea M.D.   On: 12/27/2022 07:54   DG Chest Portable 1 View  Result Date: 12/26/2022 CLINICAL DATA:  Chest tube placement EXAM: PORTABLE CHEST 1 VIEW COMPARISON:  Chest x-ray earlier same day FINDINGS: There is a new right-sided chest tube in place with distal tip projecting over the upper right lung. Small right pneumothorax is present which has significantly decreased in size now measuring 13 mm from the lung apex. There some residual patchy opacities in the right lung base. Patient is status post cardiac surgery. The heart is enlarged, unchanged. No mediastinal shift or pleural effusion. No acute fractures. IMPRESSION: 1. Small right pneumothorax has significantly decreased in size status post right chest tube placement. 2. Residual patchy opacities in the right lung base. Electronically Signed   By: Darliss Cheney M.D.   On: 12/26/2022 20:58   DG Shoulder Right  Result Date: 12/26/2022 CLINICAL DATA:  Fall, pain EXAM: RIGHT SHOULDER - 2+ VIEW COMPARISON:  Chest radiograph dated 12/26/2022 FINDINGS: No fracture or dislocation of the shoulder. The visualized soft tissues are unremarkable. Large right pneumothorax, previously noted on chest radiograph. Associated nondisplaced fractures of the right anterior 4th and 5th ribs. IMPRESSION: Large right pneumothorax,  previously noted on chest radiograph. Associated nondisplaced fractures of the right anterior 4th and 5th ribs. Electronically Signed   By: Charline Bills M.D.   On: 12/26/2022 19:23   CT Head Wo Contrast  Result Date: 12/26/2022 CLINICAL DATA:  Fall, right forehead hematoma EXAM: CT HEAD WITHOUT CONTRAST CT CERVICAL SPINE WITHOUT CONTRAST TECHNIQUE: Multidetector CT imaging of the head and cervical spine was performed following the standard protocol without intravenous contrast. Multiplanar CT image reconstructions of the cervical spine were also generated. RADIATION DOSE REDUCTION: This exam was performed according to the departmental dose-optimization program which includes automated exposure control, adjustment of the mA and/or kV according to patient size and/or use of iterative reconstruction technique. COMPARISON:  11/20/2022 FINDINGS: CT HEAD FINDINGS Brain: No evidence of acute infarction, hemorrhage, hydrocephalus, extra-axial collection or mass lesion/mass effect. Subcortical white matter and periventricular small vessel ischemic changes. Vascular: Intracranial atherosclerosis. Skull: Normal. Negative for fracture or  focal lesion. Old right maxillary sinus fractures. Sinuses/Orbits: The visualized paranasal sinuses are essentially clear. The mastoid air cells are unopacified. Other: Soft tissue swelling/hematoma overlying the lateral right frontal bone (series 2/image 22). CT CERVICAL SPINE FINDINGS Alignment: Normal cervical lordosis. Skull base and vertebrae: No acute fracture. No primary bone lesion or focal pathologic process. Soft tissues and spinal canal: No prevertebral fluid or swelling. No visible canal hematoma. Disc levels: Mild degenerative changes at C4-5. Spinal canal is patent. Upper chest: Large right pneumothorax, noted on chest radiograph. Other: None. IMPRESSION: Soft tissue swelling/hematoma overlying the lateral right frontal bone. No evidence of calvarial fracture. No evidence  of acute intracranial abnormality. Small vessel ischemic changes. No traumatic injury to the cervical spine. Large right pneumothorax, noted on chest radiograph. Electronically Signed   By: Charline Bills M.D.   On: 12/26/2022 19:20   CT Cervical Spine Wo Contrast  Result Date: 12/26/2022 CLINICAL DATA:  Fall, right forehead hematoma EXAM: CT HEAD WITHOUT CONTRAST CT CERVICAL SPINE WITHOUT CONTRAST TECHNIQUE: Multidetector CT imaging of the head and cervical spine was performed following the standard protocol without intravenous contrast. Multiplanar CT image reconstructions of the cervical spine were also generated. RADIATION DOSE REDUCTION: This exam was performed according to the departmental dose-optimization program which includes automated exposure control, adjustment of the mA and/or kV according to patient size and/or use of iterative reconstruction technique. COMPARISON:  11/20/2022 FINDINGS: CT HEAD FINDINGS Brain: No evidence of acute infarction, hemorrhage, hydrocephalus, extra-axial collection or mass lesion/mass effect. Subcortical white matter and periventricular small vessel ischemic changes. Vascular: Intracranial atherosclerosis. Skull: Normal. Negative for fracture or focal lesion. Old right maxillary sinus fractures. Sinuses/Orbits: The visualized paranasal sinuses are essentially clear. The mastoid air cells are unopacified. Other: Soft tissue swelling/hematoma overlying the lateral right frontal bone (series 2/image 22). CT CERVICAL SPINE FINDINGS Alignment: Normal cervical lordosis. Skull base and vertebrae: No acute fracture. No primary bone lesion or focal pathologic process. Soft tissues and spinal canal: No prevertebral fluid or swelling. No visible canal hematoma. Disc levels: Mild degenerative changes at C4-5. Spinal canal is patent. Upper chest: Large right pneumothorax, noted on chest radiograph. Other: None. IMPRESSION: Soft tissue swelling/hematoma overlying the lateral right  frontal bone. No evidence of calvarial fracture. No evidence of acute intracranial abnormality. Small vessel ischemic changes. No traumatic injury to the cervical spine. Large right pneumothorax, noted on chest radiograph. Electronically Signed   By: Charline Bills M.D.   On: 12/26/2022 19:20   DG Hip Unilat W or Wo Pelvis 2-3 Views Right  Result Date: 12/26/2022 CLINICAL DATA:  Fall EXAM: DG HIP (WITH OR WITHOUT PELVIS) 2-3V RIGHT COMPARISON:  None Available. FINDINGS: No fracture or dislocation is seen. Mild degenerative changes of the right hip. Left hip joint space is preserved. Visualized bony pelvis appears intact. Degenerative changes of the lower lumbar spine. IMPRESSION: Negative. Electronically Signed   By: Charline Bills M.D.   On: 12/26/2022 19:16   DG Chest 1 View  Result Date: 12/26/2022 CLINICAL DATA:  Recent fall with chest pain, initial encounter EXAM: PORTABLE CHEST 1 VIEW COMPARISON:  11/19/2022 FINDINGS: Cardiac shadow is within normal limits. Defibrillator is again seen. Aortic calcifications are noted. Left lung is clear. Right lung demonstrates a large pneumothorax with approximately 4.6 cm excursion at the apex. No definitive rib fracture is noted. IMPRESSION: Large right-sided pneumothorax Critical Value/emergent results were called by telephone at the time of interpretation on 12/26/2022 at 7:06 pm to Dr. Jene Every , who  verbally acknowledged these results. Electronically Signed   By: Alcide Clever M.D.   On: 12/26/2022 19:07    Labs:  CBC: Recent Labs    09/30/22 1049 11/19/22 2243 12/26/22 1824 12/27/22 0330  WBC 7.8 7.6 10.8* 9.7  HGB 10.8* 9.5* 10.8* 9.4*  HCT 32.6* 29.6* 33.9* 29.0*  PLT 169.0 136* 184 158    COAGS: No results for input(s): "INR", "APTT" in the last 8760 hours.  BMP: Recent Labs    08/07/22 1538 08/07/22 1544 09/30/22 1049 11/19/22 2243 12/26/22 1824 12/27/22 0330  NA 133*   < > 131* 131* 132* 132*  K 5.1   < > 4.3 4.5 4.4 4.3   CL 101   < > 98 101 100 103  CO2 24  --  29 24 26 29   GLUCOSE 109*   < > 98 116* 114* 112*  BUN 24*   < > 18 22 23 21   CALCIUM 10.9*  --  11.0* 9.9 10.7* 10.1  CREATININE 1.13   < > 1.05 1.36* 1.13 1.09  GFRNONAA >60  --   --  50* >60 >60   < > = values in this interval not displayed.    LIVER FUNCTION TESTS: Recent Labs    01/30/22 1132 07/03/22 1120 09/30/22 1049 12/27/22 0330  BILITOT 0.5 0.5 0.5 1.0  AST 20 19 23 24   ALT 9 10 12 10   ALKPHOS 86 87 89 75  PROT 8.1 8.0 7.7 7.2  ALBUMIN 3.9 3.9 3.7 3.2*    TUMOR MARKERS: No results for input(s): "AFPTM", "CEA", "CA199", "CHROMGRNA" in the last 8760 hours.  Assessment and Plan:  89 year old male presents to IR for right chest tube placement after chest tube placed to 12/26/2022 for traumatic right pneumothorax was displaced.  Patient resting comfortably. He is alert and oriented and in no apparent distress.  Risks and benefits right chest tube placement were discussed with the patient including bleeding, infection, damage to adjacent structures, malfunction of the tube requiring additional procedures and sepsis.  All of the patient's questions were answered, patient is agreeable to proceed. Consent signed and in chart.  Thank you for this interesting consult.  I greatly enjoyed meeting LAVAL AAGAARD and look forward to participating in their care.  A copy of this report was sent to the requesting provider on this date.  Electronically Signed: Shon Hough, NP 12/27/2022, 9:37 AM   I spent a total of 20 minutes in face to face in clinical consultation, greater than 50% of which was counseling/coordinating care for traumatic right pneumothorax.

## 2022-12-27 NOTE — TOC Initial Note (Signed)
Transition of Care [facility]) - Initial/Assessment Note    Patient Details  Name: [name] Christensen MRN: [id number] Date of Birth: [birth year]  [facility]) CM/SW Contact:    Liliana Cline, LCSW Phone Number: 12/27/2022, 4:16 PM  Clinical Narrative:                 CSW spoke with patient regarding PT/OT recs. Patient lives home with his wife who provides transport. Patient states he goes to the [facility]. [place] states he uses the [place] pharmacy or [facility]. Patient is agreeable to [facility]. Confirmed home address. Barbara Cower with [facility] is checking to see if they can accept patient for services.  Patient states he has a RW at home. Declines 3in1.  CSW asked Registration to verify if patient has VA benefits and if so to add them to patient's chart.    Expected Discharge Plan: Home w Home Health Services Barriers to Discharge: Continued Medical Work up   Patient Goals and CMS Choice Patient states their goals for this hospitalization and ongoing recovery are:: home with home health CMS Medicare.gov Compare Post Acute Care list provided to:: Patient Choice offered to / list presented to : Patient      Expected Discharge Plan and Services       Living arrangements for the past 2 months: Single Family Home                           HH Arranged: PT, OT          Prior Living Arrangements/Services Living arrangements for the past 2 months: Single Family Home Lives with:: Spouse Patient language and need for interpreter reviewed:: Yes Do you feel safe going back to the place where you live?: Yes      Need for Family Participation in Patient Care: Yes (Comment) Care giver support system in place?: Yes (comment) Current home services: DME Criminal Activity/Legal Involvement Pertinent to Current Situation/Hospitalization: No - Comment as needed  Activities of Daily Living Home Assistive Devices/Equipment: None ADL Screening (condition at time of  admission) Patient's cognitive ability adequate to safely complete daily activities?: Yes Is the patient deaf or have difficulty hearing?: Yes (bil hearing aids) Does the patient have difficulty seeing, even when wearing glasses/contacts?: No Does the patient have difficulty concentrating, remembering, or making decisions?: No Patient able to express need for assistance with ADLs?: Yes Does the patient have difficulty dressing or bathing?: No Independently performs ADLs?: Yes (appropriate for developmental age) Does the patient have difficulty walking or climbing stairs?: No Weakness of Legs: None Weakness of Arms/Hands: None  Permission Sought/Granted Permission sought to share information with : Oceanographer granted to share information with : Yes, Verbal Permission Granted     Permission granted to share info w AGENCY: HH agencies, [employer] [place]        Emotional Assessment       Orientation: : Oriented to Situation, Oriented to Self, Oriented to Place, Oriented to  Time Alcohol / Substance Use: Not Applicable Psych Involvement: No (comment)  Admission diagnosis:  Fall [W19.XXXA] Traumatic pneumothorax, initial encounter [S27.0XXA] Injury of head, initial encounter [S09.90XA] Fall, initial encounter [W19.XXXA] Patient Active Problem List   Diagnosis Date Noted   Pneumothorax on right 12/27/2022   S/P chest tube placement (HCC) 12/27/2022   Pneumothorax, traumatic 12/27/2022   Syncope 11/20/2022   Zygoma fracture (HCC) 08/19/2022   Concussion 08/19/2022   Oral bleeding 08/19/2022  Cardiac pacemaker in situ 04/02/2022   Chronic idiopathic constipation 04/02/2022   Heart failure (HCC) 04/02/2022   Problem related to unspecified psychosocial circumstances 04/02/2022   Sensorineural hearing loss, bilateral 04/02/2022   Vitamin B12 deficiency (non anemic) 04/02/2022   Systolic and diastolic CHF, chronic (HCC) 03/26/2022   Anemia 09/25/2021    Osteoporosis 07/26/2021   Post-COVID chronic fatigue 07/26/2021   Diarrhea 07/03/2021   COVID-19 06/10/2021   Low blood pressure 06/10/2021   Aortic atherosclerosis (HCC) 04/02/2021   Apathy 04/02/2021   Memory problem 03/01/2021   Contusion of left shoulder 01/07/2021   Chronic renal insufficiency, stage 3 (moderate) (HCC) 01/01/2021   Persistent atrial fibrillation (HCC) 07/05/2019   Ischemic cardiomyopathy    Upper respiratory infection 09/23/2018   Hypocalciuric hypercalcemia 09/16/2018   Fall 08/17/2018   Facial pain 08/17/2018   Hematuria 05/20/2018   Bronchiectasis (HCC) assoc with possible MAI 03/11/2018   Dyspnea on exertion 03/10/2018   Atrial flutter (HCC) 03/09/2018   PVC's (premature ventricular contractions) 03/09/2018   Arthralgia 01/29/2018   Dry skin 12/22/2017   Carotid stenosis, asymptomatic, bilateral 12/22/2017   Dysuria 11/24/2017   Anxiety disorder 11/24/2017   Hepatic cirrhosis (HCC) 06/05/2017   Weight loss 04/24/2017   Sinus node dysfunction (HCC) 04/07/2017   Chronic cholecystitis s/p lap cholecystectomy 06/05/2017 02/24/2017   Pleural effusion 02/24/2017   Dyslipidemia 08/05/2016   Constipation 08/09/2015   Cough in adult 11/30/2014   LBP (low back pain) 08/30/2013   Lung infiltrate 07/19/2013   Ventricular vs Supraventricular bigeminy 04/07/2013   Implantable cardioverter-defibrillator (ICD) in situ Nov 17, 2012   Sudden cardiac death (HCC) Nov 05, 2012   CORONARY ARTERY BYPASS GRAFT, HX OF 10/11/2008   Insomnia 12/07/2007   Essential hypertension 09/01/2007   CAD (coronary artery disease) 09/01/2007   GERD 09/01/2007   BPH (benign prostatic hyperplasia) 09/01/2007   PCP:  Tresa Garter, MD Pharmacy:   [facility], [place] - [address] [place] [address] Phone: [phone] Fax: [phone]  [facility] [id number] [place], [place] - [address] [place] [address] Phone: [phone] Fax: [phone]  [facility] [place], [place] - [address] [place] [address] Phone: [phone] Fax: [phone]     Social Determinants of Health (SDOH) Social History: SDOH Screenings   Food Insecurity: No Food Insecurity (12/27/2022)  Housing: Low Risk  (12/27/2022)  Transportation Needs: No Transportation Needs (12/27/2022)  Utilities: Not At Risk (12/27/2022)  Alcohol Screen: Low Risk  (04/02/2022)  Depression (PHQ2-9): Low Risk  (07/03/2022)  Financial Resource Strain: Low Risk  (04/02/2022)  Physical Activity: Sufficiently Active (04/02/2022)  Social Connections: Socially Integrated (04/02/2022)  Stress: No Stress Concern Present (04/02/2022)  Tobacco Use: Low Risk  (12/27/2022)   SDOH Interventions:     Readmission Risk Interventions     No data to display

## 2022-12-27 NOTE — Evaluation (Signed)
Physical Therapy Evaluation Patient Details Name: [name] Christensen MRN: [id number] DOB: 1932-01-24 Today's Date: 12/27/2022  History of Present Illness  Pt is an 89 year old male male who presented to [facility] ED 12/26/22 following a fall.  Patient reports falling backwards onto his right side striking his right hip, chest and head while unloading the truck of a car.  Workup in the ED revealed normal head/neck CT however CXR revealed right-sided pneumothorax.  EDP placed right chest tube at the bedside and patient was admitted.  Repeat CRX this morning showed recurrence of right pneumothorax with right chest tube retracted into the subcutaneous tissue, chest tube placed again on 12/27/22; Pt with Associated nondisplaced fractures of the right anterior 4th and 5th ribs; PMH significant for   eart failure, presence of pacemaker, atrial fibrillation on anticoagulation  Clinical Impression  Patient resting in bed upon arrival to room; alert and oriented, follows commands and agreeable to participation with session.  Endorses generalized pain in R chest/flank, 5/10; meds received per RN during session.  Demonstrates chronic strength/ROM deficits to bilat shoulders (unchanged with this admission); bilat LE grossly symmetrical and WFL.  Currently requiring min/mod assist for bed mobility (prefers exit through R side of bed); cga for sit/stand, bed/chair transfers and gait (25') with RW.  Demonstrates reciprocal stepping with good step height/length; fair cadence.  distance limited by need for continuous suctioning.  Minimal SOB noted, sats >89-90% at rest and with exertion on RA. Would benefit from skilled PT to address above deficits and promote optimal return to PLOF.; Recommend transition to HHPT upon discharge from acute hospitalization.      Recommendations for follow up therapy are one component of a multi-disciplinary discharge planning process, led by the attending physician.  Recommendations may be updated based  on patient status, additional functional criteria and insurance authorization.  Follow Up Recommendations Home health PT      Assistance Recommended at Discharge PRN  Patient can return home with the following  A little help with walking and/or transfers;A little help with bathing/dressing/bathroom    Equipment Recommendations Rolling walker (2 wheels)  Recommendations for Other Services       Functional Status Assessment Patient has had a recent decline in their functional status and demonstrates the ability to make significant improvements in function in a reasonable and predictable amount of time.     Precautions / Restrictions Precautions Precautions: Fall Restrictions Weight Bearing Restrictions: No      Mobility  Bed Mobility Overal bed mobility: Needs Assistance Bed Mobility: Supine to Sit     Supine to sit: HOB elevated, Min assist, Mod assist     General bed mobility comments: frequent vcs for technique; prefers exit through R side of bed due to L shoulder pain/weakness (due to fall in Dec)    Transfers Overall transfer level: Needs assistance Equipment used: Rolling walker (2 wheels) Transfers: Sit to/from Stand Sit to Stand: Min guard           General transfer comment: cuing for hand placement    Ambulation/Gait Ambulation/Gait assistance: Min guard Gait Distance (Feet): 25 Feet Assistive device: Rolling walker (2 wheels)         General Gait Details: reciprocal stepping with good step height/length; fair cadence.  distance limited by need for continuous suctioning  Stairs            Wheelchair Mobility    Modified Rankin (Stroke Patients Only)       Balance Overall balance assessment:  Needs assistance Sitting-balance support: No upper extremity supported, Feet supported Sitting balance-Leahy Scale: Good     Standing balance support: Bilateral upper extremity supported Standing balance-Leahy Scale: Fair                                Pertinent Vitals/Pain Pain Assessment Pain Assessment: Faces Pain Score: 5  Pain Location: R sided chest wall Pain Descriptors / Indicators: Discomfort Pain Intervention(s): Limited activity within patient's tolerance, Monitored during session, Repositioned    Home Living Family/patient expects to be discharged to:: Private residence Living Arrangements: Spouse/significant other Available Help at Discharge: Family;Available 24 hours/day Type of Home: House Home Access: Stairs to enter Entrance Stairs-Rails: Right Entrance Stairs-Number of Steps: 4   Home Layout: Two level;Able to live on main level with bedroom/bathroom Home Equipment: Rolling Walker (2 wheels);Shower seat - built in      Prior Function Prior Level of Function : Driving;Independent/Modified Independent;History of Falls (last six months)             Mobility Comments: PRN use of RW; does endorse 1 fall within previous six months ADLs Comments: MOD I-I in ADL/IADL     Hand Dominance   Dominant Hand: Right    Extremity/Trunk Assessment   Upper Extremity Assessment Upper Extremity Assessment: RUE deficits/detail;LUE deficits/detail RUE Deficits / Details: RUE shoulder flexion AROM to approx 1/4 full AROM, elbow/wrist appear WFL; Pt reports ROM is pain limited LUE Deficits / Details: RUE shoulder flexion AROM to approx 1/4 full AROM, elbow/wrist appear WFL; Pt reports ROM is pain limited    Lower Extremity Assessment Lower Extremity Assessment: Overall WFL for tasks assessed (grossly 4+/5 throughout)       Communication   Communication: No difficulties  Cognition Arousal/Alertness: Awake/alert Behavior During Therapy: WFL for tasks assessed/performed Overall Cognitive Status: Within Functional Limits for tasks assessed                                          General Comments General comments (skin integrity, edema, etc.): spo2 down to 88% on RA with  mobility, recovered to >90% on RA with rest; all other vitals appear WFL; all lines, leads, chest tube to suction intact pre/post session    Exercises  Reviewed ankle pumps, LAQs to be performed while seated in recliner outside of therapy; reviewed deep breathing/chest expansion therex for pulmonary hygiene.  Patient voiced understanding.  May benefit from [place]; RN informed/aware and to obtain.   Assessment/Plan    PT Assessment Patient needs continued PT services  PT Problem List Decreased strength;Decreased range of motion;Decreased balance;Decreased activity tolerance;Decreased mobility;Decreased coordination;Decreased cognition;Decreased knowledge of use of DME;Decreased safety awareness;Decreased knowledge of precautions;Cardiopulmonary status limiting activity;Pain       PT Treatment Interventions DME instruction;Gait training;Functional mobility training;Therapeutic activities;Stair training;Therapeutic exercise;Balance training;Patient/family education    PT Goals (Current goals can be found in the Care Plan section)  Acute Rehab PT Goals Patient Stated Goal: to return home PT Goal Formulation: With patient Time For Goal Achievement: 01/10/23 Potential to Achieve Goals: Good    Frequency Min 2X/week     Co-evaluation   Reason for Co-Treatment: Complexity of the patient's impairments (multi-system involvement);To address functional/ADL transfers   OT goals addressed during session: ADL's and self-care       AM-PAC PT "6 Clicks" Mobility  Outcome Measure  Help needed turning from your back to your side while in a flat bed without using bedrails?: None Help needed moving from lying on your back to sitting on the side of a flat bed without using bedrails?: A Little Help needed moving to and from a bed to a chair (including a wheelchair)?: A Little Help needed standing up from a chair using your arms (e.g., wheelchair or bedside chair)?: A Little Help needed to walk in hospital  room?: A Little Help needed climbing 3-5 steps with a railing? : A Little 6 Click Score: 19    End of Session   Activity Tolerance: Patient tolerated treatment well Patient left: in chair;with call bell/phone within reach;with chair alarm set Nurse Communication: Mobility status PT Visit Diagnosis: Muscle weakness (generalized) (M62.81);Difficulty in walking, not elsewhere classified (R26.2)    Time: 1329-1350 PT Time Calculation (min) (ACUTE ONLY): 21 min   Charges:   PT Evaluation $PT Eval Moderate Complexity: 1 Mod         Lynna Zamorano H. Owens Shark, PT, DPT, NCS 12/27/22, 3:41 PM [phone]

## 2022-12-28 ENCOUNTER — Inpatient Hospital Stay: Payer: PPO

## 2022-12-28 DIAGNOSIS — S270XXA Traumatic pneumothorax, initial encounter: Secondary | ICD-10-CM | POA: Diagnosis not present

## 2022-12-28 DIAGNOSIS — W19XXXA Unspecified fall, initial encounter: Secondary | ICD-10-CM | POA: Diagnosis not present

## 2022-12-28 LAB — CBC
HCT: 29.6 % — ABNORMAL LOW (ref 39.0–52.0)
Hemoglobin: 9.4 g/dL — ABNORMAL LOW (ref 13.0–17.0)
MCH: 29.6 pg (ref 26.0–34.0)
MCHC: 31.8 g/dL (ref 30.0–36.0)
MCV: 93.1 fL (ref 80.0–100.0)
Platelets: 162 10*3/uL (ref 150–400)
RBC: 3.18 MIL/uL — ABNORMAL LOW (ref 4.22–5.81)
RDW: 14 % (ref 11.5–15.5)
WBC: 9.7 10*3/uL (ref 4.0–10.5)
nRBC: 0 % (ref 0.0–0.2)

## 2022-12-28 LAB — BASIC METABOLIC PANEL
Anion gap: 7 (ref 5–15)
BUN: 21 mg/dL (ref 8–23)
CO2: 25 mmol/L (ref 22–32)
Calcium: 9.2 mg/dL (ref 8.9–10.3)
Chloride: 95 mmol/L — ABNORMAL LOW (ref 98–111)
Creatinine, Ser: 0.93 mg/dL (ref 0.61–1.24)
GFR, Estimated: >60 mL/min (ref >60)
Glucose, Bld: 99 mg/dL (ref 70–99)
Potassium: 4.3 mmol/L (ref 3.5–5.1)
Sodium: 127 mmol/L — ABNORMAL LOW (ref 135–145)

## 2022-12-28 LAB — MAGNESIUM: Magnesium: 1.8 mg/dL (ref 1.7–2.4)

## 2022-12-28 LAB — OSMOLALITY: Osmolality: 282 mOsm/kg (ref 275–295)

## 2022-12-28 LAB — PHOSPHORUS: Phosphorus: 2.9 mg/dL (ref 2.5–4.6)

## 2022-12-28 MED ORDER — SODIUM CHLORIDE 1 G PO TABS
1.0000 g | ORAL_TABLET | Freq: Three times a day (TID) | ORAL | Status: DC
  Administered 2022-12-28 – 2022-12-30 (×7): 1 g via ORAL
  Filled 2022-12-28 (×7): qty 1

## 2022-12-28 NOTE — Progress Notes (Signed)
12/28/2022  Subjective: No acute events overnight.  Yesterday morning his chest tube had become dislodged and a new one was placed by IR.  Then in the afternoon IR repositioned his chest tube as it was felt that it was in the fissure and not going to be able to help as efficiently with his pneumothorax.  Today, CXR shows improvement in the pneumothorax on the right side.  Patient reports having a lot of pain issues yesterday with the back and forth for his chest tubes.  Today, doing better.  Vital signs: Temp:  [97.6 F (36.4 C)-98.3 F (36.8 C)] 98.2 F (36.8 C) (02/03 0733) Pulse Rate:  [52-78] 70 (02/03 0733) Resp:  [12-20] 18 (02/03 0733) BP: (102-176)/(50-85) 102/53 (02/03 0733) SpO2:  [90 %-98 %] 91 % (02/03 0733) Weight:  [61 kg] 61 kg (02/03 0344)   Intake/Output: 02/02 0701 - 02/03 0700 In: 1296.3 [P.O.:790; I.V.:396.3; IV Piggyback:110] Out: 780 [Urine:550; Chest Tube:230] Last BM Date : 12/26/22  Physical Exam: Constitutional: No acute distress Pulm:  Right chest tube in place to suction, with serous fluid in tubing.  No air leak with expiration or coughing.  Labs:  Recent Labs    12/27/22 0330 12/28/22 0335  WBC 9.7 9.7  HGB 9.4* 9.4*  HCT 29.0* 29.6*  PLT 158 162   Recent Labs    12/27/22 0330 12/28/22 0335  NA 132* 127*  K 4.3 4.3  CL 103 95*  CO2 29 25  GLUCOSE 112* 99  BUN 21 21  CREATININE 1.09 0.93  CALCIUM 10.1 9.2   No results for input(s): "LABPROT", "INR" in the last 72 hours.  Imaging: DG Chest Port 1 View  Result Date: 12/28/2022 CLINICAL DATA:  Right-sided chest tube. EXAM: PORTABLE CHEST 1 VIEW COMPARISON:  12/27/22 FINDINGS: Left chest wall pacer device noted with leads in the right atrial appendage and right ventricle. Right-sided chest tube is again noted overlying the right midlung. The right-sided pneumothorax appears decreased in volume from previous exam. This measures 1.7 cm over the apex. Previously this measured 2.9 cm. Mild  increase interstitial markings are noted bilaterally and appear similar to the previous exam. Atelectasis versus airspace disease seen in the right base. IMPRESSION: 1. Interval decrease in volume of right-sided pneumothorax. Right chest tube remains in place. 2. Right base atelectasis versus airspace disease. Electronically Signed   By: Kerby Moors M.D.   On: 12/28/2022 08:07   IR Catheter Tube Change  Result Date: 12/27/2022 INDICATION: 89 year old with recent traumatic pneumothorax. Recently placed CT-guided chest tube. Follow-up chest radiograph raised concern that the tube may be within the minor fissure and plan for tube repositioning. EXAM: EXCHANGE AND REPOSITIONING OF RIGHT CHEST TUBE WITH FLUOROSCOPY MEDICATIONS: Fentanyl 25 mcg ANESTHESIA/SEDATION: The patient's level of consciousness and vital signs were monitored continuously by radiology nursing throughout the procedure under my direct supervision. COMPLICATIONS: None immediate. PROCEDURE: Informed consent was obtained for chest tube replacement. All questions were addressed. Maximal Sterile Barrier Technique was utilized including caps, mask, sterile gowns, sterile gloves, sterile drape, hand hygiene and skin antiseptic. A timeout was performed prior to the initiation of the procedure. Patient was placed supine on the interventional table. The right chest and existing tube were prepped and draped in sterile fashion. The retention suture was cut. The catheter was cut and removed over a superstiff Amplatz wire. Five French catheter was used to manipulate the wire into the right upper chest near the apex. New 14 [place] multipurpose drain was advanced  over the wire and the pigtail was reconstituted near the right lung apex. Chest tube aspirated air easily. Chest tube was sutured to skin and attached to the pleural evacuation canister. Fluoroscopic images were taken and saved for this procedure. FINDINGS: Old chest tube appeared to be near or within  the right minor fissure. Wire placement confirmed that the tube was within the minor fissure. The wire was repositioned using a 5 French catheter and pigtail was placed in the right upper chest near the apex. IMPRESSION: Successful exchange and repositioning of the right chest tube with fluoroscopy. Electronically Signed   By: Markus Daft M.D.   On: 12/27/2022 16:58   CT [facility] PLEURAL DRAIN W/INDWELL CATH W/IMG GUIDE  Result Date: 12/27/2022 INDICATION: 89 year old with history of a traumatic pneumothorax. Previous small bore right chest tube has been removed and the patient has recurrent right pneumothorax. Patient needs a new chest tube. EXAM: CT-GUIDED PLACEMENT OF RIGHT CHEST TUBE MEDICATIONS: Moderate sedation ANESTHESIA/SEDATION: Moderate (conscious) sedation was employed during this procedure. A total of Versed 0.'5mg'$  and fentanyl 50 mcg was administered intravenously at the order of the provider performing the procedure. Total intra-service moderate sedation time: 12 minutes. Patient's level of consciousness and vital signs were monitored continuously by radiology nurse throughout the procedure under the supervision of the provider performing the procedure. COMPLICATIONS: None immediate. PROCEDURE: Informed written consent was obtained from the patient after a thorough discussion of the procedural risks, benefits and alternatives. All questions were addressed. A timeout was performed prior to the initiation of the procedure. Patient was placed supine on the CT scanner with right side slightly elevated. CT images through the chest were obtained. Large right pneumothorax was obtained. The right mid axillary region was prepped with chlorhexidine and sterile field was created. Skin was anesthetized with 1% lidocaine. A small incision was made. Using CT guidance, a Yueh catheter was directed to the pleural space and air was aspirated. Superstiff Amplatz wire was placed and directed into the pleural space. The  tract was dilated to accommodate a 14 [place] multipurpose drain. Chest tube was attached to a pleural fluid evacuation canister and attached to suction. Follow up CT images were obtained. Chest tube was slightly pulled back in order to improve suction. Follow up CT images were obtained. Chest tube was sutured to the skin. Bandages placed. RADIATION DOSE REDUCTION: This exam was performed according to the departmental dose-optimization program which includes automated exposure control, adjustment of the mA and/or kV according to patient size and/or use of iterative reconstruction technique. FINDINGS: There was a very large right pneumothorax with compressive atelectasis in the right lower lobe and small right pleural effusion. Partial re-expansion of the right lung following chest tube placement. However, the chest tube appears to be extending into the right minor fissure. IMPRESSION: CT-guided placement of right chest tube. Right pneumothorax decreased in size following chest tube placement. Electronically Signed   By: Markus Daft M.D.   On: 12/27/2022 13:57   DG Chest Port 1 View  Result Date: 12/27/2022 CLINICAL DATA:  Status post chest tube placement. EXAM: PORTABLE CHEST 1 VIEW COMPARISON:  Chest radiograph 12/27/2022 at 0735 hours. FINDINGS: 1250 hours. Interval placement of a right sided pleural drainage catheter with associated small amount of subcutaneous emphysema. Interval decrease in the size of the right-sided pneumothorax. Left chest AICD/biventricular pacer with leads projecting over the right atrium and right ventricle. Stable postoperative changes of median sternotomy and CABG. Stable prominent interstitial opacities and bibasilar atelectasis. IMPRESSION:  Interval placement of a right-sided pleural drainage catheter with decreased size of the right pneumothorax. Electronically Signed   By: Emmit Alexanders M.D.   On: 12/27/2022 13:15    Assessment/Plan: This is a 89 y.o. male with right sided  pneumothorax  --After repositioning of the chest tube yesterday, today the PTX is improving.  No airleak noted.   --For now continue chest tube to suction as the lung is not fully expanded yet.  Will repeat CXR in AM.  Would also hold off on anticoagulation still since his lung is not fully expanded yet.   I spent 35 minutes dedicated to the care of this patient on the date of this encounter to include pre-visit review of records, face-to-face time with the patient discussing diagnosis and management, and any post-visit coordination of care.  Melvyn Neth, [facility]

## 2022-12-28 NOTE — TOC Progression Note (Addendum)
Transition of Care [facility]) - Progression Note    Patient Details  Name: [name] Christensen MRN: [id number] Date of Birth: 07/10/1932  Transition of Care [facility]) CM/SW Champion, LCSW Phone Number: 12/28/2022, 10:22 AM  Clinical Narrative:    [name] with [facility] is able to accept patient for Home Health services if chest tube is removed prior to DC. [facility] CAN accept patient if he goes home with a Pleurex, will just need to know in advance. Notified MD and RN.   Per [name] with [facility], patient does have VA benefits and need approval from [place] for home health and the [place] covers Pleurex supplies. CSW sent secure encrypted email to [place] with [place]. Included [name] with [facility] and weekday RNCM.    Expected Discharge Plan: [place] Barriers to Discharge: Continued Medical Work up  Expected Discharge Plan and Services       Living arrangements for the past 2 months: Single Family Home                           HH Arranged: PT, OT           Social Determinants of Health (SDOH) Interventions SDOH Screenings   Food Insecurity: No Food Insecurity (12/27/2022)  Housing: Low Risk  (12/27/2022)  Transportation Needs: No Transportation Needs (12/27/2022)  Utilities: Not At Risk (12/27/2022)  Alcohol Screen: Low Risk  (04/02/2022)  Depression (PHQ2-9): Low Risk  (07/03/2022)  Financial Resource Strain: Low Risk  (04/02/2022)  Physical Activity: Sufficiently Active (04/02/2022)  Social Connections: Socially Integrated (04/02/2022)  Stress: No Stress Concern Present (04/02/2022)  Tobacco Use: Low Risk  (12/27/2022)    Readmission Risk Interventions     No data to display

## 2022-12-28 NOTE — Progress Notes (Signed)
[facility] Progress Note  [name]    UXN:[id number]  DOA: 12/26/2022     Date of Service: the patient was seen and examined on 12/28/2022  Chief Complaint  Patient presents with   Fall   Brief hospital course: [name] Christensen is an 89 y.o. male brought to ed for He reports that he was helping his family members get something out of the trunk when he lost his balance and fell @ 10:30 am , striking his right hip and the right side of his chest on the ground, as well as his right side of his forehead Sustained trauma to forehead with scalp hematoma and no loc or vomiting. PT able to ambulate . Take eliquis for his a.fibrillation.  Patient does not have any dizziness intermittently or presyncopal type of symptoms.   Xray shows Large right-sided pneumothorax and pt had chest tube placed in ed     Assessment and Plan:  # Pneumothorax on right, s/p mechanical fall S/p chest tube insertion was done by ED physician, it got dislodged in the morning on 2/2 General surgery was consulted, chest tube was pulled out IR consulted to insert new chest tube 2/2 14 Fr tube placed in right pleural space. Majority of right pneumothorax resolved following tube placement.  Continue as needed medication for pain control Continue fall precautions Follow general surgery for chest tube management 2/3 CXR: Interval decrease in volume of right-sided pneumothorax. Right chest tube remains in place. Right base atelectasis versus airspace disease. Held Eliquis, awaiting for general surgery clearance when to resume  CAD s/p CABG, hypertension Continue Lipitor, irbesartan, metoprolol. Monitor BP and titrate medications accordingly.   Persistent chronic A-fib,  Continued Tikosyn, Toprol-XL 50 mg twice daily Hold Eliquis for now, we will plan to resume once cleared by surgery  Isotonic hyponatremia, serum osmolality 282 Na 132---127, monitor. 2/3 started sodium chloride tablets 1 g p.o. 3 times  daily for 3 days   Anemia due to iron deficiency Iron level 20, transferrin saturation 7% 2/2 started Venofer 200 mg IV daily for 5 days Start oral iron supplement on discharge.  Follow-up with PCP to repeat iron profile after 3 to 6 months. Folic acid level and U54 within normal range   Dementia and depression, continued Aricept, Lexapro home meds BPH, continued home meds.  Body mass index is 20.36 kg/m.  Interventions:      Diet: Heart healthy DVT Prophylaxis: SCD, pharmacological prophylaxis contraindicated due to risk of bleeding    Advance goals of care discussion: Full code  Family Communication: family was present at bedside, at the time of interview.  The pt provided permission to discuss medical plan with the family. Opportunity was given to ask question and all questions were answered satisfactorily.   Disposition:  Pt is from Home, admitted with fall and right pneumothorax s/p chest tube, still has chest tube, which precludes a safe discharge. Discharge to home, when cleared by general surgery may need 2 to 3 days stay in the hospital.  Subjective: No significant events overnight except severe pain, patient was asking for pain medication.  Pain is well-controlled now on the right side of the chest it happened due to replacement of the chest tube.  Currently patient is lying comfortably, denied any other active issues, no shortness of breath, no palpitations.    Physical Exam: General: NAD, lying comfortably Appear in no distress, affect appropriate Eyes: PERRLA ENT: Oral Mucosa Clear, moist  Neck: no JVD,  Cardiovascular:  Irregular rhythm, no Murmur,  Respiratory: good respiratory effort, breath sounds improved on the right side, mild Crackles, no wheezes, mild right chest wall tenderness.  Right chest tube intact Abdomen: Bowel Sound present, Soft and no tenderness,  Skin: no rashes Extremities: no Pedal edema, no calf tenderness Neurologic: without any new  focal findings Gait not checked due to patient safety concerns  Vitals:   12/27/22 1617 12/27/22 1915 12/28/22 0344 12/28/22 0733  BP: (!) 158/73 136/64 (!) 108/50 (!) 102/53  Pulse: 70 70 78 70  Resp: '18 16 20 18  '$ Temp: 98.2 F (36.8 C) 98.3 F (36.8 C) 97.6 F (36.4 C) 98.2 F (36.8 C)  TempSrc: Oral Oral Oral Oral  SpO2: 91% 94% 92% 91%  Weight:   61 kg   Height:        Intake/Output Summary (Last 24 hours) at 12/28/2022 1105 Last data filed at 12/28/2022 1030 Gross per 24 hour  Intake 1496.33 ml  Output 785 ml  Net 711.33 ml   Filed Weights   12/27/22 0500 12/27/22 1012 12/28/22 0344  Weight: 58.9 kg 59 kg 61 kg    Data Reviewed: I have personally reviewed and interpreted daily labs, tele strips, imagings as discussed above. I reviewed all nursing notes, pharmacy notes, vitals, pertinent old records I have discussed plan of care as described above with RN and patient/family.  CBC: Recent Labs  Lab 12/26/22 1824 12/27/22 0330 12/28/22 0335  WBC 10.8* 9.7 9.7  HGB 10.8* 9.4* 9.4*  HCT 33.9* 29.0* 29.6*  MCV 93.9 92.1 93.1  PLT 184 158 696   Basic Metabolic Panel: Recent Labs  Lab 12/26/22 1824 12/27/22 0330 12/28/22 0335  NA 132* 132* 127*  K 4.4 4.3 4.3  CL 100 103 95*  CO2 '26 29 25  '$ GLUCOSE 114* 112* 99  BUN '23 21 21  '$ CREATININE 1.13 1.09 0.93  CALCIUM 10.7* 10.1 9.2  MG  --  2.1 1.8  PHOS  --  2.7 2.9    Studies: DG Chest Port 1 View  Result Date: 12/28/2022 CLINICAL DATA:  Right-sided chest tube. EXAM: PORTABLE CHEST 1 VIEW COMPARISON:  12/27/22 FINDINGS: Left chest wall pacer device noted with leads in the right atrial appendage and right ventricle. Right-sided chest tube is again noted overlying the right midlung. The right-sided pneumothorax appears decreased in volume from previous exam. This measures 1.7 cm over the apex. Previously this measured 2.9 cm. Mild increase interstitial markings are noted bilaterally and appear similar to the  previous exam. Atelectasis versus airspace disease seen in the right base. IMPRESSION: 1. Interval decrease in volume of right-sided pneumothorax. Right chest tube remains in place. 2. Right base atelectasis versus airspace disease. Electronically Signed   By: Kerby Moors M.D.   On: 12/28/2022 08:07   IR Catheter Tube Change  Result Date: 12/27/2022 INDICATION: 89 year old with recent traumatic pneumothorax. Recently placed CT-guided chest tube. Follow-up chest radiograph raised concern that the tube may be within the minor fissure and plan for tube repositioning. EXAM: EXCHANGE AND REPOSITIONING OF RIGHT CHEST TUBE WITH FLUOROSCOPY MEDICATIONS: Fentanyl 25 mcg ANESTHESIA/SEDATION: The patient's level of consciousness and vital signs were monitored continuously by radiology nursing throughout the procedure under my direct supervision. COMPLICATIONS: None immediate. PROCEDURE: Informed consent was obtained for chest tube replacement. All questions were addressed. Maximal Sterile Barrier Technique was utilized including caps, mask, sterile gowns, sterile gloves, sterile drape, hand hygiene and skin antiseptic. A timeout was performed prior to the initiation of the  procedure. Patient was placed supine on the interventional table. The right chest and existing tube were prepped and draped in sterile fashion. The retention suture was cut. The catheter was cut and removed over a superstiff Amplatz wire. Five French catheter was used to manipulate the wire into the right upper chest near the apex. New 14 French multipurpose drain was advanced over the wire and the pigtail was reconstituted near the right lung apex. Chest tube aspirated air easily. Chest tube was sutured to skin and attached to the pleural evacuation canister. Fluoroscopic images were taken and saved for this procedure. FINDINGS: Old chest tube appeared to be near or within the right minor fissure. Wire placement confirmed that the tube was within the  minor fissure. The wire was repositioned using a 5 French catheter and pigtail was placed in the right upper chest near the apex. IMPRESSION: Successful exchange and repositioning of the right chest tube with fluoroscopy. Electronically Signed   By: Markus Daft M.D.   On: 12/27/2022 16:58   CT [facility] PLEURAL DRAIN W/INDWELL CATH W/IMG GUIDE  Result Date: 12/27/2022 INDICATION: 89 year old with history of a traumatic pneumothorax. Previous small bore right chest tube has been removed and the patient has recurrent right pneumothorax. Patient needs a new chest tube. EXAM: CT-GUIDED PLACEMENT OF RIGHT CHEST TUBE MEDICATIONS: Moderate sedation ANESTHESIA/SEDATION: Moderate (conscious) sedation was employed during this procedure. A total of Versed 0.'5mg'$  and fentanyl 50 mcg was administered intravenously at the order of the provider performing the procedure. Total intra-service moderate sedation time: 12 minutes. Patient's level of consciousness and vital signs were monitored continuously by radiology nurse throughout the procedure under the supervision of the provider performing the procedure. COMPLICATIONS: None immediate. PROCEDURE: Informed written consent was obtained from the patient after a thorough discussion of the procedural risks, benefits and alternatives. All questions were addressed. A timeout was performed prior to the initiation of the procedure. Patient was placed supine on the CT scanner with right side slightly elevated. CT images through the chest were obtained. Large right pneumothorax was obtained. The right mid axillary region was prepped with chlorhexidine and sterile field was created. Skin was anesthetized with 1% lidocaine. A small incision was made. Using CT guidance, a Yueh catheter was directed to the pleural space and air was aspirated. Superstiff Amplatz wire was placed and directed into the pleural space. The tract was dilated to accommodate a 14 [place] multipurpose drain. Chest tube was  attached to a pleural fluid evacuation canister and attached to suction. Follow up CT images were obtained. Chest tube was slightly pulled back in order to improve suction. Follow up CT images were obtained. Chest tube was sutured to the skin. Bandages placed. RADIATION DOSE REDUCTION: This exam was performed according to the departmental dose-optimization program which includes automated exposure control, adjustment of the mA and/or kV according to patient size and/or use of iterative reconstruction technique. FINDINGS: There was a very large right pneumothorax with compressive atelectasis in the right lower lobe and small right pleural effusion. Partial re-expansion of the right lung following chest tube placement. However, the chest tube appears to be extending into the right minor fissure. IMPRESSION: CT-guided placement of right chest tube. Right pneumothorax decreased in size following chest tube placement. Electronically Signed   By: Markus Daft M.D.   On: 12/27/2022 13:57   DG Chest Port 1 View  Result Date: 12/27/2022 CLINICAL DATA:  Status post chest tube placement. EXAM: PORTABLE CHEST 1 VIEW COMPARISON:  Chest radiograph  12/27/2022 at 0735 hours. FINDINGS: 1250 hours. Interval placement of a right sided pleural drainage catheter with associated small amount of subcutaneous emphysema. Interval decrease in the size of the right-sided pneumothorax. Left chest AICD/biventricular pacer with leads projecting over the right atrium and right ventricle. Stable postoperative changes of median sternotomy and CABG. Stable prominent interstitial opacities and bibasilar atelectasis. IMPRESSION: Interval placement of a right-sided pleural drainage catheter with decreased size of the right pneumothorax. Electronically Signed   By: Emmit Alexanders M.D.   On: 12/27/2022 13:15    Scheduled Meds:  alfuzosin  10 mg Oral QPM   atorvastatin  20 mg Oral Daily   cinacalcet  30 mg Oral Once per day on Mon Wed Fri    dofetilide  250 mcg Oral BID   donepezil  5 mg Oral QHS   dutasteride  0.5 mg Oral QPM   escitalopram  10 mg Oral QHS   famotidine  20 mg Oral Daily   irbesartan  37.5 mg Oral Daily   metoprolol succinate  50 mg Oral Daily   sodium chloride flush  3 mL Intravenous Q12H   Continuous Infusions:  sodium chloride 20 mL/hr at 12/28/22 6468   iron sucrose Stopped (12/27/22 1658)   PRN Meds: acetaminophen **OR** acetaminophen, albuterol, HYDROcodone bit-homatropine, HYDROcodone-acetaminophen, morphine injection, nitroGLYCERIN  Time spent: 35 minutes  Author: Val Riles. MD [facility] 12/28/2022 11:05 AM  To reach On-call, see care teams to locate the attending and reach out to them via [email or web address]. If 7PM-7AM, please contact night-coverage If you still have difficulty reaching the attending provider, please page the [facility] (Director on Call) for [facility] on amion for assistance.

## 2022-12-29 ENCOUNTER — Inpatient Hospital Stay: Payer: PPO

## 2022-12-29 DIAGNOSIS — S270XXA Traumatic pneumothorax, initial encounter: Secondary | ICD-10-CM | POA: Diagnosis not present

## 2022-12-29 DIAGNOSIS — W19XXXA Unspecified fall, initial encounter: Secondary | ICD-10-CM | POA: Diagnosis not present

## 2022-12-29 LAB — MAGNESIUM: Magnesium: 1.9 mg/dL (ref 1.7–2.4)

## 2022-12-29 LAB — BASIC METABOLIC PANEL
Anion gap: 3 — ABNORMAL LOW (ref 5–15)
BUN: 25 mg/dL — ABNORMAL HIGH (ref 8–23)
CO2: 27 mmol/L (ref 22–32)
Calcium: 9.9 mg/dL (ref 8.9–10.3)
Chloride: 103 mmol/L (ref 98–111)
Creatinine, Ser: 1.14 mg/dL (ref 0.61–1.24)
GFR, Estimated: >60 mL/min (ref >60)
Glucose, Bld: 89 mg/dL (ref 70–99)
Potassium: 4.1 mmol/L (ref 3.5–5.1)
Sodium: 133 mmol/L — ABNORMAL LOW (ref 135–145)

## 2022-12-29 LAB — CBC
HCT: 28.2 % — ABNORMAL LOW (ref 39.0–52.0)
Hemoglobin: 9.1 g/dL — ABNORMAL LOW (ref 13.0–17.0)
MCH: 30.2 pg (ref 26.0–34.0)
MCHC: 32.3 g/dL (ref 30.0–36.0)
MCV: 93.7 fL (ref 80.0–100.0)
Platelets: 162 10*3/uL (ref 150–400)
RBC: 3.01 MIL/uL — ABNORMAL LOW (ref 4.22–5.81)
RDW: 14.1 % (ref 11.5–15.5)
WBC: 9 10*3/uL (ref 4.0–10.5)
nRBC: 0 % (ref 0.0–0.2)

## 2022-12-29 LAB — PHOSPHORUS: Phosphorus: 2.5 mg/dL (ref 2.5–4.6)

## 2022-12-29 NOTE — Progress Notes (Signed)
[facility] Progress Note  [name]    ZHY:[id number]  DOA: 12/26/2022     Date of Service: the patient was seen and examined on 12/29/2022  Chief Complaint  Patient presents with   Fall   Brief hospital course: DEXTER SIGNOR is an 89 y.o. male brought to ed for He reports that he was helping his family members get something out of the trunk when he lost his balance and fell @ 10:30 am , striking his right hip and the right side of his chest on the ground, as well as his right side of his forehead Sustained trauma to forehead with scalp hematoma and no loc or vomiting. PT able to ambulate . Take eliquis for his a.fibrillation.  Patient does not have any dizziness intermittently or presyncopal type of symptoms.   Xray shows Large right-sided pneumothorax and pt had chest tube placed in ed     Assessment and Plan:  # Pneumothorax on right, s/p mechanical fall S/p chest tube insertion was done by ED physician, it got dislodged in the morning on 2/2 General surgery was consulted, chest tube was pulled out IR consulted to insert new chest tube 2/2 14 Fr tube placed in right pleural space. Majority of right pneumothorax resolved following tube placement.  Continue as needed medication for pain control Continue fall precautions Follow general surgery for chest tube management 2/3 CXR: Interval decrease in volume of right-sided pneumothorax. Right chest tube remains in place. Right base atelectasis versus airspace disease. 2/4 chest tube was discontinued with suction and connected to waterseal, awaiting for repeat chest x-ray at noon and then we will start Eliquis if lungs remain expanded. Held Eliquis, awaiting for general surgery clearance when to resume   CAD s/p CABG, hypertension Continue Lipitor, irbesartan, metoprolol. Monitor BP and titrate medications accordingly.   Persistent chronic A-fib,  Continued Tikosyn, Toprol-XL 50 mg twice daily Hold Eliquis for now,  we will plan to resume once cleared by surgery  Isotonic hyponatremia, serum osmolality 282 Na 132---127--133, gradually improving, continue to monitor 2/3 started sodium chloride tablets 1 g p.o. 3 times daily for 3 days   Anemia due to iron deficiency Iron level 20, transferrin saturation 7% 2/2 started Venofer 200 mg IV daily for 5 days Start oral iron supplement on discharge.  Follow-up with PCP to repeat iron profile after 3 to 6 months. Folic acid level and E95 within normal range   Dementia and depression, continued Aricept, Lexapro home meds BPH, continued home meds.  Body mass index is 20.36 kg/m.  Interventions:      Diet: Heart healthy DVT Prophylaxis: SCD, pharmacological prophylaxis contraindicated due to risk of bleeding    Advance goals of care discussion: Full code  Family Communication: family was present at bedside, at the time of interview.  The pt provided permission to discuss medical plan with the family. Opportunity was given to ask question and all questions were answered satisfactorily.   Disposition:  Pt is from Home, admitted with fall and right pneumothorax s/p chest tube, still has chest tube, which precludes a safe discharge. Discharge to home, when cleared by general surgery may need 2 to 3 days stay in the hospital.  Subjective: No significant events overnight, prior to the chest pain is well-controlled.  Denied any worsening of shortness of breath, no any other active issues.   Physical Exam: General: NAD, lying comfortably Appear in no distress, affect appropriate Eyes: PERRLA ENT: Oral Mucosa Clear, moist  Neck: no JVD,  Cardiovascular: Irregular rhythm, no Murmur,  Respiratory: good respiratory effort, breath sounds improved on the right side, mild Crackles, no wheezes, mild right chest wall tenderness.  Right chest tube intact Abdomen: Bowel Sound present, Soft and no tenderness,  Skin: no rashes Extremities: no Pedal edema, no calf  tenderness Neurologic: without any new focal findings Gait not checked due to patient safety concerns  Vitals:   12/28/22 2016 12/29/22 0100 12/29/22 0431 12/29/22 0937  BP: (!) 122/53  (!) 134/56 (!) 123/56  Pulse: 76  70 70  Resp: '18  18 18  '$ Temp: 98.4 F (36.9 C)  97.9 F (36.6 C) 97.7 F (36.5 C)  TempSrc: Oral  Oral Oral  SpO2: 91%  91% 91%  Weight:  61.4 kg    Height:        Intake/Output Summary (Last 24 hours) at 12/29/2022 1229 Last data filed at 12/28/2022 2000 Gross per 24 hour  Intake 710 ml  Output 15 ml  Net 695 ml   Filed Weights   12/27/22 1012 12/28/22 0344 12/29/22 0100  Weight: 59 kg 61 kg 61.4 kg    Data Reviewed: I have personally reviewed and interpreted daily labs, tele strips, imagings as discussed above. I reviewed all nursing notes, pharmacy notes, vitals, pertinent old records I have discussed plan of care as described above with RN and patient/family.  CBC: Recent Labs  Lab 12/26/22 1824 12/27/22 0330 12/28/22 0335 12/29/22 0503  WBC 10.8* 9.7 9.7 9.0  HGB 10.8* 9.4* 9.4* 9.1*  HCT 33.9* 29.0* 29.6* 28.2*  MCV 93.9 92.1 93.1 93.7  PLT 184 158 162 174   Basic Metabolic Panel: Recent Labs  Lab 12/26/22 1824 12/27/22 0330 12/28/22 0335 12/29/22 0503  NA 132* 132* 127* 133*  K 4.4 4.3 4.3 4.1  CL 100 103 95* 103  CO2 '26 29 25 27  '$ GLUCOSE 114* 112* 99 89  BUN '23 21 21 '$ 25*  CREATININE 1.13 1.09 0.93 1.14  CALCIUM 10.7* 10.1 9.2 9.9  MG  --  2.1 1.8 1.9  PHOS  --  2.7 2.9 2.5    Studies: DG Chest Port 1 View  Result Date: 12/29/2022 CLINICAL DATA:  Right pneumothorax. EXAM: PORTABLE CHEST 1 VIEW COMPARISON:  12/28/2022 FINDINGS: Interval decrease in the right apical pneumothorax, now barely visible in the right lung apex. There is a prominent skin fold overlying the right upper lung is well. Right pleural drain has been pulled back in the interval although the formed loop remains intrathoracic on this one view study. The diffuse  interstitial opacity is similar to prior. The cardio pericardial silhouette is enlarged. Left-sided pacer/AICD again noted. IMPRESSION: Interval decrease in the right apical pneumothorax, now barely visible in the right lung apex. Electronically Signed   By: Misty Stanley M.D.   On: 12/29/2022 05:52    Scheduled Meds:  alfuzosin  10 mg Oral QPM   atorvastatin  20 mg Oral Daily   cinacalcet  30 mg Oral Once per day on Mon Wed Fri   dofetilide  250 mcg Oral BID   donepezil  5 mg Oral QHS   dutasteride  0.5 mg Oral QPM   escitalopram  10 mg Oral QHS   famotidine  20 mg Oral Daily   irbesartan  37.5 mg Oral Daily   metoprolol succinate  50 mg Oral Daily   sodium chloride flush  3 mL Intravenous Q12H   sodium chloride  1 g Oral TID WC  Continuous Infusions:  iron sucrose Stopped (12/28/22 1900)   PRN Meds: acetaminophen **OR** acetaminophen, albuterol, HYDROcodone bit-homatropine, HYDROcodone-acetaminophen, morphine injection, nitroGLYCERIN  Time spent: 35 minutes  Author: Val Riles. MD [facility] 12/29/2022 12:29 PM  To reach On-call, see care teams to locate the attending and reach out to them via [email or web address]. If 7PM-7AM, please contact night-coverage If you still have difficulty reaching the attending provider, please page the [facility] (Director on Call) for [facility] on amion for assistance.

## 2022-12-29 NOTE — Plan of Care (Signed)

## 2022-12-29 NOTE — Progress Notes (Signed)
12/29/2022  Subjective: No acute events overnight.  CXR this morning shows an almost resolved PTX, with very minimal separation at the apex.  Denies any shortness of breath or worsening pain.  Vital signs: Temp:  [97.7 F (36.5 C)-98.4 F (36.9 C)] 97.7 F (36.5 C) (02/04 0937) Pulse Rate:  [70-76] 70 (02/04 0937) Resp:  [18] 18 (02/04 0937) BP: (122-142)/(53-62) 123/56 (02/04 0937) SpO2:  [91 %-96 %] 91 % (02/04 0937) Weight:  [61.4 kg] 61.4 kg (02/04 0100)   Intake/Output: 02/03 0701 - 02/04 0700 In: 910 [P.O.:800; IV Piggyback:110] Out: 20 [Chest Tube:20] Last BM Date : 12/26/22  Physical Exam: Constitutional:  No acute distress Pulm:  Right chest tube in place, with serous fluid in tubing/canister.  No airleak noted on expiration or coughing.   Labs:  Recent Labs    12/28/22 0335 12/29/22 0503  WBC 9.7 9.0  HGB 9.4* 9.1*  HCT 29.6* 28.2*  PLT 162 162   Recent Labs    12/28/22 0335 12/29/22 0503  NA 127* 133*  K 4.3 4.1  CL 95* 103  CO2 25 27  GLUCOSE 99 89  BUN 21 25*  CREATININE 0.93 1.14  CALCIUM 9.2 9.9   No results for input(s): "LABPROT", "INR" in the last 72 hours.  Imaging: DG Chest Port 1 View  Result Date: 12/29/2022 CLINICAL DATA:  Right pneumothorax. EXAM: PORTABLE CHEST 1 VIEW COMPARISON:  12/28/2022 FINDINGS: Interval decrease in the right apical pneumothorax, now barely visible in the right lung apex. There is a prominent skin fold overlying the right upper lung is well. Right pleural drain has been pulled back in the interval although the formed loop remains intrathoracic on this one view study. The diffuse interstitial opacity is similar to prior. The cardio pericardial silhouette is enlarged. Left-sided pacer/AICD again noted. IMPRESSION: Interval decrease in the right apical pneumothorax, now barely visible in the right lung apex. Electronically Signed   By: Misty Stanley M.D.   On: 12/29/2022 05:52    Assessment/Plan: This is a 89 y.o.  male with a right pneumothorax, resolving.  --Discussed with the patient the findings on his CXR this morning.  Overall there's a minimal if any residual PTX.  No airleak on exam.  I think it's reasonable to attempt waterseal today.  Have disconnected suction from pleuravac this morning and placed CXR order for 12:30 pm.  If lung remains expanded, will leave to waterseal and likely d/c chest tube tomorrow.   I spent 35 minutes dedicated to the care of this patient on the date of this encounter to include pre-visit review of records, face-to-face time with the patient discussing diagnosis and management, and any post-visit coordination of care.  Melvyn Neth, [facility]

## 2022-12-29 NOTE — Progress Notes (Signed)
Physical Therapy Treatment Patient Details Name: [name] Christensen MRN: [id number] DOB: [birth year] Today's Date: 12/29/2022   History of Present Illness Pt is an 89 year old male male who presented to [facility] ED 12/26/22 following a fall.  Patient reports falling backwards onto his right side striking his right hip, chest and head while unloading the truck of a car.  Workup in the ED revealed normal head/neck CT however CXR revealed right-sided pneumothorax.  EDP placed right chest tube at the bedside and patient was admitted.  Repeat CRX this morning showed recurrence of right pneumothorax with right chest tube retracted into the subcutaneous tissue, chest tube placed again on 12/27/22; Pt with Associated nondisplaced fractures of the right anterior 4th and 5th ribs; PMH significant for   eart failure, presence of pacemaker, atrial fibrillation on anticoagulation    PT Comments    Pt on waterseal and OK'ed by RN for PT session.  To EOB with mod a x 1.  Pt requests and demands help due to L shoulder pain.  Steady in sitting.  Declined help to stand but does need min a x 1 to gain balance.  He is able to walk to bathroom to void then complete 2 1/2 laps on unit with RW and min guard.  Generally speedy gait with no LOB or buckling but does endorse some inc R chest discomfort and feeling "wobbly" upon return to room.  Opts to remain in chair after session.  Pt likes to self direct session but overall does well.  He does become frustrated over what he perceives is my loud voice which does impact session then later stated it is his hearing aides turned up too loud. Encouraged him to adjust so sound is appropriate.   He generally is progressing well but does seem a bit frustrated over health and lack of control of situation.  Encouragement given and sign placed on door to keep closed at all time per his request.   Recommendations for follow up therapy are one component of a multi-disciplinary discharge planning  process, led by the attending physician.  Recommendations may be updated based on patient status, additional functional criteria and insurance authorization.  Follow Up Recommendations  Home health PT     Assistance Recommended at Discharge PRN  Patient can return home with the following A little help with walking and/or transfers;A little help with bathing/dressing/bathroom   Equipment Recommendations  Rolling walker (2 wheels)    Recommendations for Other Services       Precautions / Restrictions Precautions Precautions: Fall Restrictions Weight Bearing Restrictions: No     Mobility  Bed Mobility Overal bed mobility: Needs Assistance Bed Mobility: Supine to Sit     Supine to sit: Mod assist     General bed mobility comments: due to pain and c/o L shoulder soreness    Transfers Overall transfer level: Needs assistance Equipment used: Rolling walker (2 wheels) Transfers: Sit to/from Stand Sit to Stand: Min guard, Min assist           General transfer comment: cuing for hand placement    Ambulation/Gait Ambulation/Gait assistance: Min guard Gait Distance (Feet): 500 Feet Assistive device: Rolling walker (2 wheels) Gait Pattern/deviations: Step-through pattern, Decreased step length - right, Decreased step length - left, Trunk flexed Gait velocity: WFL     General Gait Details: 2 1/2 laps on unit with good speed.  does report feeling a bit "wobbly" upon return to room.  cued to slow down but no  LOB's   Stairs             Wheelchair Mobility    Modified Rankin (Stroke Patients Only)       Balance Overall balance assessment: Needs assistance Sitting-balance support: No upper extremity supported, Feet supported Sitting balance-Leahy Scale: Good     Standing balance support: Bilateral upper extremity supported Standing balance-Leahy Scale: Fair                              Cognition Arousal/Alertness: Awake/alert Behavior  During Therapy: WFL for tasks assessed/performed Overall Cognitive Status: Within Functional Limits for tasks assessed                                          Exercises Other Exercises Other Exercises: to bathroom to void    General Comments        Pertinent Vitals/Pain Pain Assessment Pain Assessment: Faces Faces Pain Scale: Hurts little more Pain Location: R sided chest wall Pain Descriptors / Indicators: Discomfort, Sore Pain Intervention(s): Limited activity within patient's tolerance, Monitored during session, Repositioned    Home Living                          Prior Function            PT Goals (current goals can now be found in the care plan section) Progress towards PT goals: Progressing toward goals    Frequency    Min 2X/week      PT Plan Current plan remains appropriate    Co-evaluation              AM-PAC PT "6 Clicks" Mobility   Outcome Measure  Help needed turning from your back to your side while in a flat bed without using bedrails?: None Help needed moving from lying on your back to sitting on the side of a flat bed without using bedrails?: A Little Help needed moving to and from a bed to a chair (including a wheelchair)?: A Little Help needed standing up from a chair using your arms (e.g., wheelchair or bedside chair)?: A Little Help needed to walk in hospital room?: A Little Help needed climbing 3-5 steps with a railing? : A Little 6 Click Score: 19    End of Session   Activity Tolerance: Patient tolerated treatment well Patient left: in chair;with call bell/phone within reach;with chair alarm set Nurse Communication: Mobility status PT Visit Diagnosis: Muscle weakness (generalized) (M62.81);Difficulty in walking, not elsewhere classified (R26.2)     Time: 7654-6503 PT Time Calculation (min) (ACUTE ONLY): 17 min  Charges:  $Gait Training: 8-22 mins                   Chesley Noon, PTA 12/29/22,  10:53 AM

## 2022-12-30 ENCOUNTER — Inpatient Hospital Stay: Payer: PPO

## 2022-12-30 DIAGNOSIS — W19XXXA Unspecified fall, initial encounter: Secondary | ICD-10-CM | POA: Diagnosis not present

## 2022-12-30 DIAGNOSIS — S270XXA Traumatic pneumothorax, initial encounter: Secondary | ICD-10-CM | POA: Diagnosis not present

## 2022-12-30 LAB — CBC
HCT: 27.4 % — ABNORMAL LOW (ref 39.0–52.0)
Hemoglobin: 8.9 g/dL — ABNORMAL LOW (ref 13.0–17.0)
MCH: 30 pg (ref 26.0–34.0)
MCHC: 32.5 g/dL (ref 30.0–36.0)
MCV: 92.3 fL (ref 80.0–100.0)
Platelets: 182 10*3/uL (ref 150–400)
RBC: 2.97 MIL/uL — ABNORMAL LOW (ref 4.22–5.81)
RDW: 13.8 % (ref 11.5–15.5)
WBC: 9.9 10*3/uL (ref 4.0–10.5)
nRBC: 0 % (ref 0.0–0.2)

## 2022-12-30 LAB — GLUCOSE, CAPILLARY: Glucose-Capillary: 137 mg/dL — ABNORMAL HIGH (ref 70–99)

## 2022-12-30 LAB — BASIC METABOLIC PANEL
Anion gap: 7 (ref 5–15)
BUN: 23 mg/dL (ref 8–23)
CO2: 25 mmol/L (ref 22–32)
Calcium: 10 mg/dL (ref 8.9–10.3)
Chloride: 99 mmol/L (ref 98–111)
Creatinine, Ser: 1.02 mg/dL (ref 0.61–1.24)
GFR, Estimated: >60 mL/min (ref >60)
Glucose, Bld: 107 mg/dL — ABNORMAL HIGH (ref 70–99)
Potassium: 4.1 mmol/L (ref 3.5–5.1)
Sodium: 131 mmol/L — ABNORMAL LOW (ref 135–145)

## 2022-12-30 LAB — PHOSPHORUS: Phosphorus: 2.6 mg/dL (ref 2.5–4.6)

## 2022-12-30 LAB — MAGNESIUM: Magnesium: 1.7 mg/dL (ref 1.7–2.4)

## 2022-12-30 MED ORDER — APIXABAN 5 MG PO TABS
5.0000 mg | ORAL_TABLET | Freq: Two times a day (BID) | ORAL | Status: DC
  Administered 2022-12-30 – 2022-12-31 (×2): 5 mg via ORAL
  Filled 2022-12-30 (×2): qty 1

## 2022-12-30 MED ORDER — MAGNESIUM SULFATE 2 GM/50ML IV SOLN
2.0000 g | Freq: Once | INTRAVENOUS | Status: AC
  Administered 2022-12-30: 2 g via INTRAVENOUS
  Filled 2022-12-30: qty 50

## 2022-12-30 MED ORDER — SODIUM CHLORIDE 1 G PO TABS
1.0000 g | ORAL_TABLET | Freq: Three times a day (TID) | ORAL | Status: DC
  Administered 2022-12-30 – 2022-12-31 (×3): 1 g via ORAL
  Filled 2022-12-30 (×3): qty 1

## 2022-12-30 NOTE — Care Management Important Message (Signed)
Important Message  Patient Details  Name: [name] Christensen MRN: [id number] Date of Birth: Sep 13, 1932   Medicare Important Message Given:  Yes  Patient asleep upon time of visit, no family available.  Copy of Medicare IM left on beside tray for reference.    Dannette Barbara 12/30/2022, 3:33 PM

## 2022-12-30 NOTE — Progress Notes (Signed)
OT Cancellation Note  Patient Details Name: [name] Christensen MRN: [id number] DOB: 17-Oct-1932   Cancelled Treatment:    Reason Eval/Treat Not Completed: Other (comment). Upon entering room, pt supine in bed with spouse present. Pt reported going 5 laps around the unit with PT then sat up in the recliner for a while. Pt stated he just got back from x-ray and requested to take the rest of the day off from therapy. Will re-attempt at later date/time.  Lanelle Bal  [facility] 12/30/2022, 12:29 PM

## 2022-12-30 NOTE — Plan of Care (Signed)
  Problem: Clinical Measurements: Goal: Respiratory complications will improve Outcome: Progressing   Problem: Activity: Goal: Risk for activity intolerance will decrease Outcome: Progressing   Problem: Nutrition: Goal: Adequate nutrition will be maintained Outcome: Progressing   Problem: Elimination: Goal: Will not experience complications related to bowel motility Outcome: Not Progressing   Problem: Pain Managment: Goal: General experience of comfort will improve Outcome: Progressing   Problem: Safety: Goal: Ability to remain free from injury will improve Outcome: Progressing   Problem: Skin Integrity: Goal: Risk for impaired skin integrity will decrease Outcome: Progressing

## 2022-12-30 NOTE — TOC Progression Note (Signed)
Transition of Care [facility]) - Progression Note    Patient Details  Name: [name] Christensen MRN: [id number] Date of Birth: 04/08/1932  [facility] CM/SW Contact  Beverly Sessions, RN Phone Number: 12/30/2022, 12:16 PM  Clinical Narrative:    Anticipate that patient will not need chest tube/pleurex at discharge  [facility] with [place] updated Per Tye [place] with [place] home health has been processed but not yet approved    Expected Discharge Plan: [place] Barriers to Discharge: Continued Medical Work up  Expected Discharge Plan and [place] arrangements for the past 2 months: Single Family Home                           HH Arranged: PT, OT           Social Determinants of Health (SDOH) Interventions SDOH Screenings   Food Insecurity: No Food Insecurity (12/27/2022)  Housing: Low Risk  (12/27/2022)  Transportation Needs: No Transportation Needs (12/27/2022)  Utilities: Not At Risk (12/27/2022)  Alcohol Screen: Low Risk  (04/02/2022)  Depression (PHQ2-9): Low Risk  (07/03/2022)  Financial Resource Strain: Low Risk  (04/02/2022)  Physical Activity: Sufficiently Active (04/02/2022)  Social Connections: Socially Integrated (04/02/2022)  Stress: No Stress Concern Present (04/02/2022)  Tobacco Use: Low Risk  (12/27/2022)    Readmission Risk Interventions     No data to display

## 2022-12-30 NOTE — Discharge Instructions (Signed)
In addition to included general post-operative instructions,  Diet: Resume home diet.   Activity: No heavy lifting >20 pounds (children, pets, laundry, garbage) or strenuous activity until follow up, but light activity and walking are encouraged. Do not drive or drink alcohol if taking narcotic pain medications or having pain that might distract from driving.  Wound care: Leave right chest dressing in place for 2 days. Okay to shower; no baths. After two days, okay to cover with band-aid or simply dressing as needed   Call office [phone] / [phone]) at any time if any questions, worsening pain, fevers/chills, bleeding, drainage from incision site, or other concerns.

## 2022-12-30 NOTE — Progress Notes (Signed)
Physical Therapy Treatment Patient Details Name: [name] Christensen MRN: [id number] DOB: 03-15-1932 Today's Date: 12/30/2022   History of Present Illness Pt is an 89 year old male male who [facility] ED 12/26/22 following a fall.  Patient reports falling backwards onto his right side striking his right hip, chest and head while unloading the truck of a car.  Workup in the ED revealed normal head/neck CT however CXR revealed right-sided pneumothorax.  EDP placed right chest tube at the bedside and patient was admitted.  Repeat CRX this morning showed recurrence of right pneumothorax with right chest tube retracted into the subcutaneous tissue, chest tube placed again on 12/27/22; Pt with Associated nondisplaced fractures of the right anterior 4th and 5th ribs; PMH significant for   eart failure, presence of pacemaker, atrial fibrillation on anticoagulation    PT Comments    OOB with mod a x 1.  Once seated min guard for standing, to bathroom to void and 4 laps on unit with RW.  Opts to remain in chair after session.  Needs met.  Pt seemed overall happier today and voiced being happy over anticipated discharge home tomorrow.   Recommendations for follow up therapy are one component of a multi-disciplinary discharge planning process, led by the attending physician.  Recommendations may be updated based on patient status, additional functional criteria and insurance authorization.  Follow Up Recommendations  Home health PT     Assistance Recommended at Discharge PRN  Patient can return home with the following A little help with walking and/or transfers;A little help with bathing/dressing/bathroom;Help with stairs or ramp for entrance   Equipment Recommendations  Rolling walker (2 wheels)    Recommendations for Other Services       Precautions / Restrictions Precautions Precautions: Fall Restrictions Weight Bearing Restrictions: No     Mobility  Bed Mobility Overal bed mobility: Needs  Assistance Bed Mobility: Supine to Sit     Supine to sit: Mod assist     General bed mobility comments: due to pain and c/o L shoulder soreness    Transfers Overall transfer level: Needs assistance Equipment used: Rolling walker (2 wheels) Transfers: Sit to/from Stand Sit to Stand: Min guard, Min assist           General transfer comment: cuing for hand placement    Ambulation/Gait Ambulation/Gait assistance: Min guard, Supervision Gait Distance (Feet): 700 Feet Assistive device: Rolling walker (2 wheels) Gait Pattern/deviations: Step-through pattern, Decreased step length - right, Decreased step length - left, Trunk flexed Gait velocity: WFL     General Gait Details: 4 laps today   Stairs             Wheelchair Mobility    Modified Rankin (Stroke Patients Only)       Balance Overall balance assessment: Needs assistance Sitting-balance support: No upper extremity supported, Feet supported Sitting balance-Leahy Scale: Good     Standing balance support: Bilateral upper extremity supported Standing balance-Leahy Scale: Fair                              Cognition Arousal/Alertness: Awake/alert Behavior During Therapy: WFL for tasks assessed/performed Overall Cognitive Status: Within Functional Limits for tasks assessed                                          Exercises  Other Exercises Other Exercises: to bathroom to void    General Comments        Pertinent Vitals/Pain Pain Assessment Pain Assessment: Faces Faces Pain Scale: Hurts a little bit Pain Location: R sided chest wall Pain Descriptors / Indicators: Discomfort, Sore    Home Living                          Prior Function            PT Goals (current goals can now be found in the care plan section) Progress towards PT goals: Progressing toward goals    Frequency    Min 2X/week      PT Plan Current plan remains appropriate     Co-evaluation              AM-PAC PT "6 Clicks" Mobility   Outcome Measure  Help needed turning from your back to your side while in a flat bed without using bedrails?: None Help needed moving from lying on your back to sitting on the side of a flat bed without using bedrails?: A Little Help needed moving to and from a bed to a chair (including a wheelchair)?: A Little Help needed standing up from a chair using your arms (e.g., wheelchair or bedside chair)?: A Little Help needed to walk in hospital room?: A Little Help needed climbing 3-5 steps with a railing? : A Little 6 Click Score: 19    End of Session   Activity Tolerance: Patient tolerated treatment well Patient left: in chair;with call bell/phone within reach;with chair alarm set Nurse Communication: Mobility status PT Visit Diagnosis: Muscle weakness (generalized) (M62.81);Difficulty in walking, not elsewhere classified (R26.2)     Time: 2409-7353 PT Time Calculation (min) (ACUTE ONLY): 16 min  Charges:  $Gait Training: 8-22 mins                   Chesley Noon, PTA 12/30/22, 11:06 AM

## 2022-12-30 NOTE — Progress Notes (Signed)
[facility] Progress Note  [name]    QIW:[id number]  DOA: 12/26/2022     Date of Service: the patient was seen and examined on 12/30/2022  Chief Complaint  Patient presents with   Fall   Brief hospital course: [name] Christensen is an 89 y.o. male brought to ed for He reports that he was helping his family members get something out of the trunk when he lost his balance and fell @ 10:30 am , striking his right hip and the right side of his chest on the ground, as well as his right side of his forehead Sustained trauma to forehead with scalp hematoma and no loc or vomiting. PT able to ambulate . Take eliquis for his a.fibrillation.  Patient does not have any dizziness intermittently or presyncopal type of symptoms.   Xray shows Large right-sided pneumothorax and pt had chest tube placed in ed     Assessment and Plan:  # Pneumothorax on right, s/p mechanical fall S/p chest tube insertion was done by ED physician, it got dislodged in the morning on 2/2 General surgery was consulted, chest tube was pulled out IR consulted to insert new chest tube 2/2 14 Fr tube placed in right pleural space. Majority of right pneumothorax resolved following tube placement.  Continue as needed medication for pain control Continue fall precautions Follow general surgery for chest tube management 2/3 CXR: Interval decrease in volume of right-sided pneumothorax. Right chest tube remains in place. Right base atelectasis versus airspace disease. 2/4 chest tube was discontinued with suction and connected to waterseal, awaiting for repeat chest x-ray at noon and then we will start Eliquis if lungs remain expanded. 2/5 chest tube was removed by general surgery, pneumothorax almost resolved.  Cleared to start Eliquis today.  CAD s/p CABG, hypertension Continue Lipitor, irbesartan, metoprolol. Monitor BP and titrate medications accordingly.   Persistent chronic A-fib,  Continued Tikosyn, Toprol-XL  50 mg twice daily 2/5 resumed Eliquis 5 mg p.o. twice daily   Isotonic hyponatremia, serum osmolality 282 Na 132---127--133--131, gradually improving, continue to monitor 2/3 started sodium chloride tablets 1 g p.o. 3 times daily for 5 days   Anemia due to iron deficiency Iron level 20, transferrin saturation 7% 2/2 started Venofer 200 mg IV daily for 5 days Start oral iron supplement on discharge.  Follow-up with PCP to repeat iron profile after 3 to 6 months. Folic acid level and E17 within normal range   Dementia and depression, continued Aricept, Lexapro home meds BPH, continued home meds.  Body mass index is 20.36 kg/m.  Interventions:      Diet: Heart healthy DVT Prophylaxis: SCD, pharmacological prophylaxis contraindicated due to risk of bleeding    Advance goals of care discussion: Full code  Family Communication: family was present at bedside, at the time of interview.  The pt provided permission to discuss medical plan with the family. Opportunity was given to ask question and all questions were answered satisfactorily.   Disposition:  Pt is from Home, admitted with fall and right pneumothorax s/p chest tube, still has chest tube, which precludes a safe discharge. Discharge to home, when cleared by general surgery may need 2 to 3 days stay in the hospital.  Subjective: No significant events overnight, right-sided chest wall pain 6/10, improved after taking p.o. medication.  Denied any palpitations, no shortness of breath, no any other active issues.  Physical Exam: General: NAD, lying comfortably Appear in no distress, affect appropriate Eyes: PERRLA ENT: Oral  Mucosa Clear, moist  Neck: no JVD,  Cardiovascular: Irregular rhythm, no Murmur,  Respiratory: good respiratory effort, breath sounds improved on the right side, mild Crackles, no wheezes, mild right chest wall tenderness.  Right chest tube intact Abdomen: Bowel Sound present, Soft and no tenderness,   Skin: no rashes Extremities: no Pedal edema, no calf tenderness Neurologic: without any new focal findings Gait not checked due to patient safety concerns  Vitals:   12/30/22 0011 12/30/22 0421 12/30/22 0500 12/30/22 0724  BP: (!) 130/56 113/63  136/61  Pulse: 70 69  70  Resp: '18 17  18  '$ Temp: 98 F (36.7 C) 98.6 F (37 C)  (!) 97.5 F (36.4 C)  TempSrc: Oral Oral  Oral  SpO2: 94% 94%  95%  Weight:   61.4 kg   Height:        Intake/Output Summary (Last 24 hours) at 12/30/2022 1411 Last data filed at 12/30/2022 0756 Gross per 24 hour  Intake 540 ml  Output 451 ml  Net 89 ml   Filed Weights   12/28/22 0344 12/29/22 0100 12/30/22 0500  Weight: 61 kg 61.4 kg 61.4 kg    Data Reviewed: I have personally reviewed and interpreted daily labs, tele strips, imagings as discussed above. I reviewed all nursing notes, pharmacy notes, vitals, pertinent old records I have discussed plan of care as described above with RN and patient/family.  CBC: Recent Labs  Lab 12/26/22 1824 12/27/22 0330 12/28/22 0335 12/29/22 0503 12/30/22 0338  WBC 10.8* 9.7 9.7 9.0 9.9  HGB 10.8* 9.4* 9.4* 9.1* 8.9*  HCT 33.9* 29.0* 29.6* 28.2* 27.4*  MCV 93.9 92.1 93.1 93.7 92.3  PLT 184 158 162 162 644   Basic Metabolic Panel: Recent Labs  Lab 12/26/22 1824 12/27/22 0330 12/28/22 0335 12/29/22 0503 12/30/22 0338  NA 132* 132* 127* 133* 131*  K 4.4 4.3 4.3 4.1 4.1  CL 100 103 95* 103 99  CO2 '26 29 25 27 25  '$ GLUCOSE 114* 112* 99 89 107*  BUN '23 21 21 '$ 25* 23  CREATININE 1.13 1.09 0.93 1.14 1.02  CALCIUM 10.7* 10.1 9.2 9.9 10.0  MG  --  2.1 1.8 1.9 1.7  PHOS  --  2.7 2.9 2.5 2.6    Studies: DG Chest 2 View  Result Date: 12/30/2022 CLINICAL DATA:  Follow-up right pneumothorax. EXAM: CHEST - 2 VIEW COMPARISON:  12/30/2022 at 5:51 a.m.  Chest CT, 11/20/2022. FINDINGS: Tiny right apical pneumothorax is without change from the earlier study. Right lateral hemithorax chest tube is stable. No left  pneumothorax.  No pleural effusions. Lungs are hyperexpanded with thickened bilateral interstitial markings. No lung consolidation. IMPRESSION: 1. No change from the earlier exam. Tiny right apical pneumothorax. Stable right chest tube. Electronically Signed   By: Lajean Manes M.D.   On: 12/30/2022 11:54   DG Chest Port 1 View  Result Date: 12/30/2022 CLINICAL DATA:  [id number] Pneumothorax on right [id number] EXAM: PORTABLE CHEST 1 VIEW COMPARISON:  December 29, 2022 FINDINGS: The cardiomediastinal silhouette is unchanged in contour.LEFT chest AICD. No significant pleural effusion. Trace RIGHT apical pneumothorax with RIGHT chest tube in place. RIGHT chest tube radiodense line projects just at the level of the pleural border on this single view radiograph. Persistent coarse reticulation bilaterally, not significantly changed. IMPRESSION: Trace RIGHT apical pneumothorax with RIGHT chest tube in place. RIGHT chest tube radiodense line delineating the start of side holes projects just at the level of the pleural border on this single view  radiograph. Recommend correlation with function. Electronically Signed   By: Valentino Saxon M.D.   On: 12/30/2022 07:54    Scheduled Meds:  alfuzosin  10 mg Oral QPM   apixaban  5 mg Oral BID   atorvastatin  20 mg Oral Daily   cinacalcet  30 mg Oral Once per day on Mon Wed Fri   dofetilide  250 mcg Oral BID   donepezil  5 mg Oral QHS   dutasteride  0.5 mg Oral QPM   escitalopram  10 mg Oral QHS   famotidine  20 mg Oral Daily   irbesartan  37.5 mg Oral Daily   metoprolol succinate  50 mg Oral Daily   sodium chloride flush  3 mL Intravenous Q12H   sodium chloride  1 g Oral TID WC   Continuous Infusions:  iron sucrose Stopped (12/29/22 1815)   PRN Meds: acetaminophen **OR** acetaminophen, albuterol, HYDROcodone bit-homatropine, HYDROcodone-acetaminophen, morphine injection, nitroGLYCERIN  Time spent: 35 minutes  Author: Val Riles. MD [facility] 12/30/2022 2:11 PM  To reach On-call, see care teams to locate the attending and reach out to them via [email or web address]. If 7PM-7AM, please contact night-coverage If you still have difficulty reaching the attending provider, please page the [facility] (Director on Call) for [facility] on amion for assistance.

## 2022-12-30 NOTE — Progress Notes (Signed)
[facility] SURGICAL PROGRESS NOTE (cpt [phone])  Hospital Day(s): 4.  Interval History: Patient seen and examined, no acute events or new complaints overnight. Patient reports mild right sided chest pain this morning. He denies fever, chills, cough, SOB. Labs this morning are reassuring. Chest tube has no output recorded; no air leak on examination. CT has been to water seal. CXR this morning shows continued re-inflation of right lung.    Review of Systems:  Constitutional: denies fever, chills  HEENT: denies cough or congestion  Respiratory: denies any shortness of breath  Cardiovascular: denies chest pain or palpitations  Gastrointestinal: denies abdominal pain, N/V Genitourinary: denies burning with urination or urinary frequency Musculoskeletal: denies pain, decreased motor or sensation  Vital signs in last 24 hours: [min-max] current  Temp:  [97.5 F (36.4 C)-98.9 F (37.2 C)] 97.5 F (36.4 C) (02/05 0724) Pulse Rate:  [69-97] 70 (02/05 0724) Resp:  [17-19] 18 (02/05 0724) BP: (113-165)/(56-79) 136/61 (02/05 0724) SpO2:  [91 %-95 %] 95 % (02/05 0724) Weight:  [61.4 kg] 61.4 kg (02/05 0500)     Height: '5\' 7"'$  (170.2 cm) Weight: 61.4 kg BMI (Calculated): 21.2   Intake/Output last 2 shifts:  02/04 0701 - 02/05 0700 In: 780 [P.O.:780] Out: 151 [Urine:151]   Physical Exam:  Constitutional: alert, cooperative and no distress  HENT: normocephalic without obvious abnormality  Eyes: PERRL, EOM's grossly intact and symmetric  Respiratory: breathing non-labored at rest  Cardiovascular: regular rate and sinus rhythm  Chest: Chest tube to right lateral chest wall; site CDI; no air leak. Chest tube clamped this morning.    Labs:     Latest Ref Rng & Units 12/30/2022    3:38 AM 12/29/2022    5:03 AM 12/28/2022    3:35 AM  CBC  WBC 4.0 - 10.5 K/uL 9.9  9.0  9.7   Hemoglobin 13.0 - 17.0 g/dL 8.9  9.1  9.4   Hematocrit 39.0 - 52.0 % 27.4  28.2  29.6   Platelets 150 -  400 K/uL 182  162  162       Latest Ref Rng & Units 12/30/2022    3:38 AM 12/29/2022    5:03 AM 12/28/2022    3:35 AM  CMP  Glucose 70 - 99 mg/dL 107  89  99   BUN 8 - 23 mg/dL '23  25  21   '$ Creatinine 0.61 - 1.24 mg/dL 1.02  1.14  0.93   Sodium 135 - 145 mmol/L 131  133  127   Potassium 3.5 - 5.1 mmol/L 4.1  4.1  4.3   Chloride 98 - 111 mmol/L 99  103  95   CO2 22 - 32 mmol/L '25  27  25   '$ Calcium 8.9 - 10.3 mg/dL 10.0  9.9  9.2     Imaging studies:   CXR (12/30/2022) personally reviewed with very trace right apical pneumothorax; chest tube in place, and radiologist report reviewed below:  IMPRESSION: Trace RIGHT apical pneumothorax with RIGHT chest tube in place. RIGHT chest tube radiodense line delineating the start of side holes projects just at the level of the pleural border on this single view radiograph. Recommend correlation with function.   Assessment/Plan: (ICD-10's: J93.9) 89 y.o. male with resolved traumatic right sided pneumothorax following mechanical fall, complicated by pertinent comorbidities including advanced age, need for anticoagulation.   - Given that he had required three separate procedures for placement, we will be cautious with removal. Will clamp this morning and  plan on repeat CXR this afternoon. If repeat CXR is clear, we will pull CT this afternoon.  - Pain control prn - Continue pulmonary toilet; IS use - Mobilize; therapy evaluations ordered  - Okay to restart anticoagulation              - Further management per primary service     All of the above findings and recommendations were discussed with the patient, and the medical team, and all of patient's questions were answered to his expressed satisfaction.  -- Edison Simon, PA-C [facility] 12/30/2022, 7:33 AM M-F: 7am - 4pm

## 2022-12-31 ENCOUNTER — Inpatient Hospital Stay: Payer: PPO

## 2022-12-31 ENCOUNTER — Other Ambulatory Visit: Payer: Self-pay

## 2022-12-31 ENCOUNTER — Ambulatory Visit: Payer: PPO | Admitting: Internal Medicine

## 2022-12-31 DIAGNOSIS — S270XXA Traumatic pneumothorax, initial encounter: Secondary | ICD-10-CM

## 2022-12-31 LAB — BASIC METABOLIC PANEL
Anion gap: 6 (ref 5–15)
BUN: 24 mg/dL — ABNORMAL HIGH (ref 8–23)
CO2: 26 mmol/L (ref 22–32)
Calcium: 9.8 mg/dL (ref 8.9–10.3)
Chloride: 101 mmol/L (ref 98–111)
Creatinine, Ser: 1.02 mg/dL (ref 0.61–1.24)
GFR, Estimated: >60 mL/min (ref >60)
Glucose, Bld: 101 mg/dL — ABNORMAL HIGH (ref 70–99)
Potassium: 4.2 mmol/L (ref 3.5–5.1)
Sodium: 133 mmol/L — ABNORMAL LOW (ref 135–145)

## 2022-12-31 LAB — CBC
HCT: 28.1 % — ABNORMAL LOW (ref 39.0–52.0)
Hemoglobin: 9.2 g/dL — ABNORMAL LOW (ref 13.0–17.0)
MCH: 30.4 pg (ref 26.0–34.0)
MCHC: 32.7 g/dL (ref 30.0–36.0)
MCV: 92.7 fL (ref 80.0–100.0)
Platelets: 187 10*3/uL (ref 150–400)
RBC: 3.03 MIL/uL — ABNORMAL LOW (ref 4.22–5.81)
RDW: 14 % (ref 11.5–15.5)
WBC: 8.9 10*3/uL (ref 4.0–10.5)
nRBC: 0 % (ref 0.0–0.2)

## 2022-12-31 LAB — MAGNESIUM: Magnesium: 2.1 mg/dL (ref 1.7–2.4)

## 2022-12-31 LAB — PHOSPHORUS: Phosphorus: 2.6 mg/dL (ref 2.5–4.6)

## 2022-12-31 MED ORDER — VITAMIN C 500 MG PO TABS: 250.0000 mg | ORAL_TABLET | Freq: Every day | ORAL | 2 refills | Status: AC

## 2022-12-31 MED ORDER — POLYSACCHARIDE IRON COMPLEX 150 MG PO CAPS: 150.0000 mg | ORAL_CAPSULE | Freq: Every day | ORAL | 2 refills | Status: AC

## 2022-12-31 NOTE — Plan of Care (Signed)
  Problem: Health Behavior/Discharge Planning: Goal: Ability to manage health-related needs will improve Outcome: Progressing   Problem: Clinical Measurements: Goal: Respiratory complications will improve Outcome: Progressing   Problem: Activity: Goal: Risk for activity intolerance will decrease Outcome: Progressing   Problem: Pain Managment: Goal: General experience of comfort will improve Outcome: Progressing   Problem: Safety: Goal: Ability to remain free from injury will improve Outcome: Progressing   Problem: Skin Integrity: Goal: Risk for impaired skin integrity will decrease Outcome: Progressing

## 2022-12-31 NOTE — Discharge Summary (Signed)
[facility] Discharge Summary   [name] BWI:[id number]  PCP: [name] Anger, MD  Date of admission: 12/26/2022   Date of discharge:  12/31/2022     Discharge Diagnoses:  Principal Problem:   Pneumothorax, traumatic Active Problems:   Fall   Pneumothorax on right   Anemia   CORONARY ARTERY BYPASS GRAFT, HX OF   Persistent atrial fibrillation (HCC)   Systolic and diastolic CHF, chronic (HCC)   BPH (benign prostatic hyperplasia)   Admitted From: Home Disposition:  Home with [facility] PT/OT/RN  Recommendations for Outpatient Follow-up:  Follow-up with PCP in 1 week, repeat iron profile after 3 to 6 months  Follow up LABS/TEST:  as above   Follow-up Information     [name] Fantasia, PA-C. Schedule an appointment as soon as possible for a visit in 2 week(s).   Specialty: Physician Assistant Why: Hospital follow up; right PTX...needs CXR prior to appointment Contact information: [address] [place] [address] [phone]                Diet recommendation: Cardiac diet  Activity: The patient is advised to gradually reintroduce usual activities, as tolerated  Discharge Condition: stable  Code Status: Full code   History of present illness: As per the H and P dictated on admission  Hospital Course:  [name] Christensen is an 89 y.o. male brought to ed for He reports that he was helping his family members get something out of the trunk when he lost his balance and fell @ 10:30 am , striking his right hip and the right side of his chest on the ground, as well as his right side of his forehead Sustained trauma to forehead with scalp hematoma and no loc or vomiting. PT able to ambulate . Take eliquis for his a.fibrillation.  Patient does not have any dizziness intermittently or presyncopal type of symptoms.  Xray shows Large right-sided pneumothorax and pt had chest tube placed in ed     Assessment and Plan: # Pneumothorax on right, s/p  mechanical fall S/p chest tube insertion was done by ED physician, it got dislodged in the morning on 2/2 General surgery was consulted, chest tube was pulled out IR consulted to insert new chest tube 2/2 14 Fr tube placed in right pleural space. Majority of right pneumothorax resolved following tube placement. Continued prn pain meds and fall precautions.  General surgery was following.  2/3 CXR: Interval decrease in volume of right-sided pneumothorax. Right chest tube remains in place. Right base atelectasis versus airspace disease. 2/4 chest tube was discontinued with suction and connected to waterseal, awaiting for repeat chest x-ray at noon and then we will start Eliquis if lungs remain expanded. 2/5 chest tube was removed by general surgery, pneumothorax almost resolved.  Cleared to start Eliquis today. 2/6 CXR shows resolution of pneumothorax.  Patient was feeling better, agreed with the discharge planning. # CAD s/p CABG, hypertension, Continue Lipitor, irbesartan, metoprolol. # Persistent chronic A-fib, rate controlled. Continued Tikosyn, Toprol-XL 50 mg twice daily 2/5 resumed Eliquis 5 mg p.o. twice daily # Isotonic hyponatremia, serum osmolality 282, Na 132---127--133-- gradually improving. 2/3 started sodium chloride tablets 1 g p.o. TID given during hospital stay.  Continue normal salt intake for few days at home and follow with PCP to recheck BMP after 1 to 2 weeks. # Anemia due to iron deficiency. Iron level 20, transferrin saturation 7% 2/2 started Venofer 200 mg IV daily for 5 days. Start oral  iron supplement on discharge. Follow-up with PCP to repeat iron profile after 3 to 6 months. Folic acid level and T73 within normal range.  # Dementia and depression, continued Aricept, Lexapro home meds # BPH, continued home meds. Body mass index is 21.79 kg/m.  Nutrition Interventions:  Patient was seen by physical therapy, who recommended Home health, which was arranged. On the day  of the discharge the patient's vitals were stable, and no other acute medical condition were reported by patient. the patient was felt safe to be discharge at Home with Home health.  Consultants: General surgery and IR Procedures: Right-sided chest tube insertion which was discontinued on 12/30/22  Discharge Exam: General: Appear in no distress, no Rash; Oral Mucosa Clear, moist. Cardiovascular: S1 and S2 Present, no Murmur, Respiratory: normal respiratory effort, Bilateral Air entry present and no Crackles, no wheezes Abdomen: Bowel Sound present, Soft and no tenderness, no hernia Extremities: no Pedal edema, no calf tenderness Neurology: alert and oriented to time, place, and person affect appropriate.  Filed Weights   12/29/22 0100 12/30/22 0500 12/31/22 0530  Weight: 61.4 kg 61.4 kg 63.1 kg   Vitals:   12/30/22 2051 12/31/22 0420  BP: 138/64 (!) 147/66  Pulse: 70 74  Resp: 15 17  Temp: 98.2 F (36.8 C) 98.1 F (36.7 C)  SpO2: 94% 95%    DISCHARGE MEDICATION: Allergies as of 12/31/2022       Reactions   Ezetimibe Other (See Comments)   Pt was in coma for 6 days, numbness   Penicillins Other (See Comments)   BLISTERS BETWEEN FINGERS Did it involve swelling of the face/tongue/throat, SOB, or low BP? No Did it involve sudden or severe rash/hives, skin peeling, or any reaction on the inside of your mouth or nose? Yes Did you need to seek medical attention at a hospital or doctor's office? No When did it last happen?      60 years If all above answers are "NO", may proceed with cephalosporin use.   Pravastatin Sodium Other (See Comments)   Aches and pains in joints   Rosuvastatin Other (See Comments)   MYALGIAS   Niacin Anxiety, Other (See Comments)   "Makes me feel nervous, jittery"        Medication List     TAKE these medications    acetaminophen 500 MG tablet Commonly known as: TYLENOL Take 500 mg by mouth at bedtime.   alfuzosin 10 MG 24 hr tablet Commonly  known as: UROXATRAL Take 10 mg by mouth every evening.   ascorbic acid 500 MG tablet Commonly known as: VITAMIN C Take 0.5 tablets (250 mg total) by mouth daily.   atorvastatin 40 MG tablet Commonly known as: LIPITOR Take 20 mg by mouth daily.   benzonatate 100 MG capsule Commonly known as: TESSALON Take 100 mg by mouth 3 (three) times daily as needed for cough.   betamethasone dipropionate 0.05 % cream Apply 1 application. topically 2 (two) times daily as needed.   cinacalcet 30 MG tablet Commonly known as: SENSIPAR Take 1 tablet (30 mg total) by mouth 3 (three) times a week.   cyanocobalamin 1000 MCG tablet Commonly known as: VITAMIN B12 Take 1,000 mcg by mouth daily.   dofetilide 250 MCG capsule Commonly known as: TIKOSYN TAKE 1 CAPSULE BY MOUTH TWICE A DAY   donepezil 5 MG tablet Commonly known as: ARICEPT Take 1 tablet (5 mg total) by mouth at bedtime.   dutasteride 0.5 MG capsule Commonly known as: AVODART Take  0.5 mg by mouth every evening.   Eliquis 5 MG Tabs tablet Generic drug: apixaban TAKE 1 TABLET BY MOUTH TWICE (2) DAILY What changed: See the new instructions.   escitalopram 10 MG tablet Commonly known as: LEXAPRO Take 1 tablet (10 mg total) by mouth at bedtime.   famotidine 20 MG tablet Commonly known as: PEPCID Take 20 mg by mouth daily.   HYDROcodone bit-homatropine 5-1.5 MG/5ML syrup Commonly known as: HYCODAN TAKE 5 MLS BY MOUTH EVERY 6 HOURS AS NEEDED FOR COUGH What changed: See the new instructions.   iron polysaccharides 150 MG capsule Commonly known as: NIFEREX Take 1 capsule (150 mg total) by mouth daily.   metoprolol succinate 50 MG 24 hr tablet Commonly known as: TOPROL-XL TAKE 1 TABLET BY MOUTH TWICE A DAY. TAKEWITH OR IMMEDIATELY FOLLOWING A MEAL What changed:  how much to take how to take this when to take this   MUCINEX DM PO Take 30 mg by mouth in the morning and at bedtime.   multivitamin with minerals Tabs  tablet Take 1 tablet by mouth every other day.   nitroGLYCERIN 0.4 MG SL tablet Commonly known as: NITROSTAT DISSOLVE 1 TABLET UNDER TONGUE AS NEEDEDFOR CHEST PAIN. MAY REPEAT 5 MINUTES APART 3 TIMES IF NEEDED   omeprazole 20 MG capsule Commonly known as: PRILOSEC Take 20 mg by mouth daily.   Plecanatide 3 MG Tabs Take 3 mg by mouth daily. Replaces (Linaclotide) Linzess   SYSTANE OP Place 1 drop into both eyes daily as needed (burning eyes).   triamcinolone cream 0.1 % Commonly known as: KENALOG APPLY 1 APPLICATION TOPICALLY TWO TIMES DAILY AS NEEDED AVOIDING FACE, GROIN ANDARMPITS What changed: See the new instructions.   valsartan 80 MG tablet Commonly known as: Diovan Take 1 tablet (80 mg total) by mouth daily. Take 1 tablet (80 mg) by mouth once daily               Durable Medical Equipment  (From admission, onward)           Start     Ordered   12/27/22 1449  For home use only DME 3 n 1  Once        12/27/22 1448           Allergies  Allergen Reactions   Ezetimibe Other (See Comments)    Pt was in coma for 6 days, numbness   Penicillins Other (See Comments)    BLISTERS BETWEEN FINGERS Did it involve swelling of the face/tongue/throat, SOB, or low BP? No Did it involve sudden or severe rash/hives, skin peeling, or any reaction on the inside of your mouth or nose? Yes Did you need to seek medical attention at a hospital or doctor's office? No When did it last happen?      60 years If all above answers are "NO", may proceed with cephalosporin use.    Pravastatin Sodium Other (See Comments)    Aches and pains in joints   Rosuvastatin Other (See Comments)    MYALGIAS   Niacin Anxiety and Other (See Comments)    "Makes me feel nervous, jittery"   Discharge Instructions     Call MD for:  difficulty breathing, headache or visual disturbances   Complete by: As directed    Call MD for:  extreme fatigue   Complete by: As directed    Call MD for:   persistant dizziness or light-headedness   Complete by: As directed    Call MD for:  persistant nausea and vomiting   Complete by: As directed    Call MD for:  severe uncontrolled pain   Complete by: As directed    Call MD for:  temperature >100.4   Complete by: As directed    Diet - low sodium heart healthy   Complete by: As directed    Discharge instructions   Complete by: As directed    Follow-up with PCP in 1 week, repeat iron profile after 3 to 6 months   Increase activity slowly   Complete by: As directed        The results of significant diagnostics from this hospitalization (including imaging, microbiology, ancillary and laboratory) are listed below for reference.    Significant Diagnostic Studies: DG Chest Port 1 View  Result Date: 12/31/2022 CLINICAL DATA:  Follow-up pneumothorax EXAM: PORTABLE CHEST 1 VIEW COMPARISON:  12/30/2022 FINDINGS: Interval removal of RIGHT chest tube. No appreciable RIGHT pneumothorax. Midline sternotomy.  LEFT-sided pacer.  Stable cardiac silhouette. Chronic bronchitic markings. IMPRESSION: No appreciable pneumothorax following RIGHT chest tube removal. Electronically Signed   By: Suzy Bouchard M.D.   On: 12/31/2022 08:41   DG Chest 2 View  Result Date: 12/30/2022 CLINICAL DATA:  Follow-up right pneumothorax. EXAM: CHEST - 2 VIEW COMPARISON:  12/30/2022 at 5:51 a.m.  Chest CT, 11/20/2022. FINDINGS: Tiny right apical pneumothorax is without change from the earlier study. Right lateral hemithorax chest tube is stable. No left pneumothorax.  No pleural effusions. Lungs are hyperexpanded with thickened bilateral interstitial markings. No lung consolidation. IMPRESSION: 1. No change from the earlier exam. Tiny right apical pneumothorax. Stable right chest tube. Electronically Signed   By: Lajean Manes M.D.   On: 12/30/2022 11:54   DG Chest Port 1 View  Result Date: 12/30/2022 CLINICAL DATA:  [id number] Pneumothorax on right [id number] EXAM: PORTABLE CHEST 1  VIEW COMPARISON:  December 29, 2022 FINDINGS: The cardiomediastinal silhouette is unchanged in contour.LEFT chest AICD. No significant pleural effusion. Trace RIGHT apical pneumothorax with RIGHT chest tube in place. RIGHT chest tube radiodense line projects just at the level of the pleural border on this single view radiograph. Persistent coarse reticulation bilaterally, not significantly changed. IMPRESSION: Trace RIGHT apical pneumothorax with RIGHT chest tube in place. RIGHT chest tube radiodense line delineating the start of side holes projects just at the level of the pleural border on this single view radiograph. Recommend correlation with function. Electronically Signed   By: Valentino Saxon M.D.   On: 12/30/2022 07:54   DG Chest Port 1 View  Result Date: 12/29/2022 CLINICAL DATA:  Right pneumothorax EXAM: PORTABLE CHEST 1 VIEW COMPARISON:  12/29/2022 at 5:19 a.m. FINDINGS: Less than 5% right apical pneumothorax, roughly stable. Right pleural drainage catheter remains in place. AICD noted. Atherosclerotic calcification of the aortic arch. Prior CABG. Stable reticular interstitial accentuation at the lung bases bilaterally. IMPRESSION: 1. Less than 5% right apical pneumothorax, roughly stable. Right pleural drainage catheter remains in place. 2. Stable reticular interstitial accentuation at the lung bases bilaterally. 3.  Aortic Atherosclerosis (ICD10-I70.0). Electronically Signed   By: Van Clines M.D.   On: 12/29/2022 14:52   DG Chest Port 1 View  Result Date: 12/29/2022 CLINICAL DATA:  Right pneumothorax. EXAM: PORTABLE CHEST 1 VIEW COMPARISON:  12/28/2022 FINDINGS: Interval decrease in the right apical pneumothorax, now barely visible in the right lung apex. There is a prominent skin fold overlying the right upper lung is well. Right pleural drain has been pulled back in the interval although the  formed loop remains intrathoracic on this one view study. The diffuse interstitial opacity is  similar to prior. The cardio pericardial silhouette is enlarged. Left-sided pacer/AICD again noted. IMPRESSION: Interval decrease in the right apical pneumothorax, now barely visible in the right lung apex. Electronically Signed   By: Misty Stanley M.D.   On: 12/29/2022 05:52   DG Chest Port 1 View  Result Date: 12/28/2022 CLINICAL DATA:  Right-sided chest tube. EXAM: PORTABLE CHEST 1 VIEW COMPARISON:  12/27/22 FINDINGS: Left chest wall pacer device noted with leads in the right atrial appendage and right ventricle. Right-sided chest tube is again noted overlying the right midlung. The right-sided pneumothorax appears decreased in volume from previous exam. This measures 1.7 cm over the apex. Previously this measured 2.9 cm. Mild increase interstitial markings are noted bilaterally and appear similar to the previous exam. Atelectasis versus airspace disease seen in the right base. IMPRESSION: 1. Interval decrease in volume of right-sided pneumothorax. Right chest tube remains in place. 2. Right base atelectasis versus airspace disease. Electronically Signed   By: Kerby Moors M.D.   On: 12/28/2022 08:07   IR Catheter Tube Change  Result Date: 12/27/2022 INDICATION: 89 year old with recent traumatic pneumothorax. Recently placed CT-guided chest tube. Follow-up chest radiograph raised concern that the tube may be within the minor fissure and plan for tube repositioning. EXAM: EXCHANGE AND REPOSITIONING OF RIGHT CHEST TUBE WITH FLUOROSCOPY MEDICATIONS: Fentanyl 25 mcg ANESTHESIA/SEDATION: The patient's level of consciousness and vital signs were monitored continuously by radiology nursing throughout the procedure under my direct supervision. COMPLICATIONS: None immediate. PROCEDURE: Informed consent was obtained for chest tube replacement. All questions were addressed. Maximal Sterile Barrier Technique was utilized including caps, mask, sterile gowns, sterile gloves, sterile drape, hand hygiene and skin  antiseptic. A timeout was performed prior to the initiation of the procedure. Patient was placed supine on the interventional table. The right chest and existing tube were prepped and draped in sterile fashion. The retention suture was cut. The catheter was cut and removed over a superstiff Amplatz wire. Five French catheter was used to manipulate the wire into the right upper chest near the apex. New 14 French multipurpose drain was advanced over the wire and the pigtail was reconstituted near the right lung apex. Chest tube aspirated air easily. Chest tube was sutured to skin and attached to the pleural evacuation canister. Fluoroscopic images were taken and saved for this procedure. FINDINGS: Old chest tube appeared to be near or within the right minor fissure. Wire placement confirmed that the tube was within the minor fissure. The wire was repositioned using a 5 French catheter and pigtail was placed in the right upper chest near the apex. IMPRESSION: Successful exchange and repositioning of the right chest tube with fluoroscopy. Electronically Signed   By: Markus Daft M.D.   On: 12/27/2022 16:58   CT [facility] PLEURAL DRAIN W/INDWELL CATH W/IMG GUIDE  Result Date: 12/27/2022 INDICATION: 89 year old with history of a traumatic pneumothorax. Previous small bore right chest tube has been removed and the patient has recurrent right pneumothorax. Patient needs a new chest tube. EXAM: CT-GUIDED PLACEMENT OF RIGHT CHEST TUBE MEDICATIONS: Moderate sedation ANESTHESIA/SEDATION: Moderate (conscious) sedation was employed during this procedure. A total of Versed 0.'5mg'$  and fentanyl 50 mcg was administered intravenously at the order of the provider performing the procedure. Total intra-service moderate sedation time: 12 minutes. Patient's level of consciousness and vital signs were monitored continuously by radiology nurse throughout the procedure under the supervision of the  provider performing the procedure. COMPLICATIONS:  None immediate. PROCEDURE: Informed written consent was obtained from the patient after a thorough discussion of the procedural risks, benefits and alternatives. All questions were addressed. A timeout was performed prior to the initiation of the procedure. Patient was placed supine on the CT scanner with right side slightly elevated. CT images through the chest were obtained. Large right pneumothorax was obtained. The right mid axillary region was prepped with chlorhexidine and sterile field was created. Skin was anesthetized with 1% lidocaine. A small incision was made. Using CT guidance, a Yueh catheter was directed to the pleural space and air was aspirated. Superstiff Amplatz wire was placed and directed into the pleural space. The tract was dilated to accommodate a 14 [place] multipurpose drain. Chest tube was attached to a pleural fluid evacuation canister and attached to suction. Follow up CT images were obtained. Chest tube was slightly pulled back in order to improve suction. Follow up CT images were obtained. Chest tube was sutured to the skin. Bandages placed. RADIATION DOSE REDUCTION: This exam was performed according to the departmental dose-optimization program which includes automated exposure control, adjustment of the mA and/or kV according to patient size and/or use of iterative reconstruction technique. FINDINGS: There was a very large right pneumothorax with compressive atelectasis in the right lower lobe and small right pleural effusion. Partial re-expansion of the right lung following chest tube placement. However, the chest tube appears to be extending into the right minor fissure. IMPRESSION: CT-guided placement of right chest tube. Right pneumothorax decreased in size following chest tube placement. Electronically Signed   By: Markus Daft M.D.   On: 12/27/2022 13:57   DG Chest Port 1 View  Result Date: 12/27/2022 CLINICAL DATA:  Status post chest tube placement. EXAM: PORTABLE CHEST 1 VIEW  COMPARISON:  Chest radiograph 12/27/2022 at 0735 hours. FINDINGS: 1250 hours. Interval placement of a right sided pleural drainage catheter with associated small amount of subcutaneous emphysema. Interval decrease in the size of the right-sided pneumothorax. Left chest AICD/biventricular pacer with leads projecting over the right atrium and right ventricle. Stable postoperative changes of median sternotomy and CABG. Stable prominent interstitial opacities and bibasilar atelectasis. IMPRESSION: Interval placement of a right-sided pleural drainage catheter with decreased size of the right pneumothorax. Electronically Signed   By: Emmit Alexanders M.D.   On: 12/27/2022 13:15   DG Chest 2 View  Result Date: 12/27/2022 CLINICAL DATA:  Pneumothorax on the right EXAM: CHEST - 2 VIEW COMPARISON:  Yesterday FINDINGS: Enlarged right pneumothorax now 6 cm in the apical midline, previously 1.2 cm. No midline shift. Generalized interstitial coarsening and mild reticulonodular airspace disease. Prior CABG. Dual-chamber ICD leads from the left. ASAP, these results will be called to the ordering clinician or representative by the Radiologist Assistant, and communication documented in the PACS or [employer]. A prelim was also sent in epic chat directly to the team. IMPRESSION: 1. Progressed right pneumothorax which is now moderate to large. 2. Stable mild interstitial and airspace disease. Electronically Signed   By: Jorje Guild M.D.   On: 12/27/2022 07:54   DG Chest Portable 1 View  Result Date: 12/26/2022 CLINICAL DATA:  Chest tube placement EXAM: PORTABLE CHEST 1 VIEW COMPARISON:  Chest x-ray earlier same day FINDINGS: There is a new right-sided chest tube in place with distal tip projecting over the upper right lung. Small right pneumothorax is present which has significantly decreased in size now measuring 13 mm from the lung apex. There  some residual patchy opacities in the right lung base. Patient is status  post cardiac surgery. The heart is enlarged, unchanged. No mediastinal shift or pleural effusion. No acute fractures. IMPRESSION: 1. Small right pneumothorax has significantly decreased in size status post right chest tube placement. 2. Residual patchy opacities in the right lung base. Electronically Signed   By: Ronney Asters M.D.   On: 12/26/2022 20:58   DG Shoulder Right  Result Date: 12/26/2022 CLINICAL DATA:  Fall, pain EXAM: RIGHT SHOULDER - 2+ VIEW COMPARISON:  Chest radiograph dated 12/26/2022 FINDINGS: No fracture or dislocation of the shoulder. The visualized soft tissues are unremarkable. Large right pneumothorax, previously noted on chest radiograph. Associated nondisplaced fractures of the right anterior 4th and 5th ribs. IMPRESSION: Large right pneumothorax, previously noted on chest radiograph. Associated nondisplaced fractures of the right anterior 4th and 5th ribs. Electronically Signed   By: Julian Hy M.D.   On: 12/26/2022 19:23   CT Head Wo Contrast  Result Date: 12/26/2022 CLINICAL DATA:  Fall, right forehead hematoma EXAM: CT HEAD WITHOUT CONTRAST CT CERVICAL SPINE WITHOUT CONTRAST TECHNIQUE: Multidetector CT imaging of the head and cervical spine was performed following the standard protocol without intravenous contrast. Multiplanar CT image reconstructions of the cervical spine were also generated. RADIATION DOSE REDUCTION: This exam was performed according to the departmental dose-optimization program which includes automated exposure control, adjustment of the mA and/or kV according to patient size and/or use of iterative reconstruction technique. COMPARISON:  11/20/2022 FINDINGS: CT HEAD FINDINGS Brain: No evidence of acute infarction, hemorrhage, hydrocephalus, extra-axial collection or mass lesion/mass effect. Subcortical white matter and periventricular small vessel ischemic changes. Vascular: Intracranial atherosclerosis. Skull: Normal. Negative for fracture or focal  lesion. Old right maxillary sinus fractures. Sinuses/Orbits: The visualized paranasal sinuses are essentially clear. The mastoid air cells are unopacified. Other: Soft tissue swelling/hematoma overlying the lateral right frontal bone (series 2/image 22). CT CERVICAL SPINE FINDINGS Alignment: Normal cervical lordosis. Skull base and vertebrae: No acute fracture. No primary bone lesion or focal pathologic process. Soft tissues and spinal canal: No prevertebral fluid or swelling. No visible canal hematoma. Disc levels: Mild degenerative changes at C4-5. Spinal canal is patent. Upper chest: Large right pneumothorax, noted on chest radiograph. Other: None. IMPRESSION: Soft tissue swelling/hematoma overlying the lateral right frontal bone. No evidence of calvarial fracture. No evidence of acute intracranial abnormality. Small vessel ischemic changes. No traumatic injury to the cervical spine. Large right pneumothorax, noted on chest radiograph. Electronically Signed   By: Julian Hy M.D.   On: 12/26/2022 19:20   CT Cervical Spine Wo Contrast  Result Date: 12/26/2022 CLINICAL DATA:  Fall, right forehead hematoma EXAM: CT HEAD WITHOUT CONTRAST CT CERVICAL SPINE WITHOUT CONTRAST TECHNIQUE: Multidetector CT imaging of the head and cervical spine was performed following the standard protocol without intravenous contrast. Multiplanar CT image reconstructions of the cervical spine were also generated. RADIATION DOSE REDUCTION: This exam was performed according to the departmental dose-optimization program which includes automated exposure control, adjustment of the mA and/or kV according to patient size and/or use of iterative reconstruction technique. COMPARISON:  11/20/2022 FINDINGS: CT HEAD FINDINGS Brain: No evidence of acute infarction, hemorrhage, hydrocephalus, extra-axial collection or mass lesion/mass effect. Subcortical white matter and periventricular small vessel ischemic changes. Vascular: Intracranial  atherosclerosis. Skull: Normal. Negative for fracture or focal lesion. Old right maxillary sinus fractures. Sinuses/Orbits: The visualized paranasal sinuses are essentially clear. The mastoid air cells are unopacified. Other: Soft tissue swelling/hematoma overlying the lateral right  frontal bone (series 2/image 22). CT CERVICAL SPINE FINDINGS Alignment: Normal cervical lordosis. Skull base and vertebrae: No acute fracture. No primary bone lesion or focal pathologic process. Soft tissues and spinal canal: No prevertebral fluid or swelling. No visible canal hematoma. Disc levels: Mild degenerative changes at C4-5. Spinal canal is patent. Upper chest: Large right pneumothorax, noted on chest radiograph. Other: None. IMPRESSION: Soft tissue swelling/hematoma overlying the lateral right frontal bone. No evidence of calvarial fracture. No evidence of acute intracranial abnormality. Small vessel ischemic changes. No traumatic injury to the cervical spine. Large right pneumothorax, noted on chest radiograph. Electronically Signed   By: Julian Hy M.D.   On: 12/26/2022 19:20   DG Hip Unilat W or Wo Pelvis 2-3 Views Right  Result Date: 12/26/2022 CLINICAL DATA:  Fall EXAM: DG HIP (WITH OR WITHOUT PELVIS) 2-3V RIGHT COMPARISON:  None Available. FINDINGS: No fracture or dislocation is seen. Mild degenerative changes of the right hip. Left hip joint space is preserved. Visualized bony pelvis appears intact. Degenerative changes of the lower lumbar spine. IMPRESSION: Negative. Electronically Signed   By: Julian Hy M.D.   On: 12/26/2022 19:16   DG Chest 1 View  Result Date: 12/26/2022 CLINICAL DATA:  Recent fall with chest pain, initial encounter EXAM: PORTABLE CHEST 1 VIEW COMPARISON:  11/19/2022 FINDINGS: Cardiac shadow is within normal limits. Defibrillator is again seen. Aortic calcifications are noted. Left lung is clear. Right lung demonstrates a large pneumothorax with approximately 4.6 cm excursion at  the apex. No definitive rib fracture is noted. IMPRESSION: Large right-sided pneumothorax Critical Value/emergent results were called by telephone at the time of interpretation on 12/26/2022 at 7:06 pm to Dr. Lavonia Drafts , who verbally acknowledged these results. Electronically Signed   By: [name] M.D.   On: 12/26/2022 19:07    Microbiology: No results found for this or any previous visit (from the past 240 hour(s)).   Labs: CBC: Recent Labs  Lab 12/27/22 0330 12/28/22 0335 12/29/22 0503 12/30/22 0338 12/31/22 0405  WBC 9.7 9.7 9.0 9.9 8.9  HGB 9.4* 9.4* 9.1* 8.9* 9.2*  HCT 29.0* 29.6* 28.2* 27.4* 28.1*  MCV 92.1 93.1 93.7 92.3 92.7  PLT 158 162 162 182 749   Basic Metabolic Panel: Recent Labs  Lab 12/27/22 0330 12/28/22 0335 12/29/22 0503 12/30/22 0338 12/31/22 0405  NA 132* 127* 133* 131* 133*  K 4.3 4.3 4.1 4.1 4.2  CL 103 95* 103 99 101  CO2 '29 25 27 25 26  '$ GLUCOSE 112* 99 89 107* 101*  BUN 21 21 25* 23 24*  CREATININE 1.09 0.93 1.14 1.02 1.02  CALCIUM 10.1 9.2 9.9 10.0 9.8  MG 2.1 1.8 1.9 1.7 2.1  PHOS 2.7 2.9 2.5 2.6 2.6   Liver Function Tests: Recent Labs  Lab 12/27/22 0330  AST 24  ALT 10  ALKPHOS 75  BILITOT 1.0  PROT 7.2  ALBUMIN 3.2*   No results for input(s): "LIPASE", "AMYLASE" in the last 168 hours. No results for input(s): "AMMONIA" in the last 168 hours. Cardiac Enzymes: No results for input(s): "CKTOTAL", "CKMB", "CKMBINDEX", "TROPONINI" in the last 168 hours. BNP (last 3 results) No results for input(s): "BNP" in the last 8760 hours. CBG: Recent Labs  Lab 12/30/22 2058  GLUCAP 137*    Time spent: 35 minutes  Signed:  Val Riles  [facility] 12/31/2022 10:37 AM

## 2022-12-31 NOTE — TOC Transition Note (Addendum)
Transition of Care [facility]) - CM/SW Discharge Note   Patient Details  Name: [name] Christensen MRN: [id number] Date of Birth: 1932/01/03  Transition of Care [name], RN Phone Number: 12/31/2022, 10:46 AM   Clinical Narrative:     Patient to discharge today [name] with [name] notified of discharge.  [place] to see patient pending auth from [place] with [place] notified of discharge Wife to transport  Per MD  note chest tube has been removed     Barriers to Discharge: Continued Medical Work up   Patient Goals and CMS Choice CMS Medicare.gov Compare Post Acute Care list provided to:: Patient Choice offered to / list presented to : Patient  Discharge Placement                         Discharge Plan and Services Additional resources added to the After Visit Summary for                            [name] Arranged: PT, OT          Social Determinants of Health (SDOH) Interventions SDOH Screenings   Food Insecurity: No Food Insecurity (12/27/2022)  Housing: Low Risk  (12/27/2022)  Transportation Needs: No Transportation Needs (12/27/2022)  Utilities: Not At Risk (12/27/2022)  Alcohol Screen: Low Risk  (04/02/2022)  Depression (PHQ2-9): Low Risk  (07/03/2022)  Financial Resource Strain: Low Risk  (04/02/2022)  Physical Activity: Sufficiently Active (04/02/2022)  Social Connections: Socially Integrated (04/02/2022)  Stress: No Stress Concern Present (04/02/2022)  Tobacco Use: Low Risk  (12/27/2022)     Readmission Risk Interventions     No data to display

## 2022-12-31 NOTE — Progress Notes (Signed)
Discharge instructions reviewed with patient including followup visits and new medications.  Understanding was verbalized and all questions were answered.  IV removed without complication; patient tolerated well.  Patient discharged home via wheelchair in stable condition escorted by volunteer staff.

## 2022-12-31 NOTE — Progress Notes (Signed)
Occupational Therapy Treatment Patient Details Name: [name] Christensen MRN: [id number] DOB: 1932/08/16 Today's Date: 12/31/2022   History of present illness Pt is an 89 year old male male who presented to [facility] ED 12/26/22 following a fall.  Patient reports falling backwards onto his right side striking his right hip, chest and head while unloading the truck of a car.  Workup in the ED revealed normal head/neck CT however CXR revealed right-sided pneumothorax.  EDP placed right chest tube at the bedside and patient was admitted.  Repeat CRX this morning showed recurrence of right pneumothorax with right chest tube retracted into the subcutaneous tissue, chest tube placed again on 12/27/22; Pt with Associated nondisplaced fractures of the right anterior 4th and 5th ribs; PMH significant for heart failure, presence of pacemaker, atrial fibrillation on anticoagulation   OT comments  Upon entering session, pt resting in bed with wife present. Both agreeable to OT. Pt reported already completing morning grooming routine and just getting back from the bathroom. Pt agreeable to seated AROM exercises. He required Mod A to come to EOB. Pt unable to complete shoulder flexion 2/2 pain, otherwise tolerated well. Min guard for safety to return to supine. Pt left as received with all needs in reach. Pt is making progress toward goal completion. D/C recommendation remains appropriate. OT will continue to follow acutely.     Recommendations for follow up therapy are one component of a multi-disciplinary discharge planning process, led by the attending physician.  Recommendations may be updated based on patient status, additional functional criteria and insurance authorization.    Follow Up Recommendations  Home health OT     Assistance Recommended at Discharge Intermittent Supervision/Assistance  Patient can return home with the following  A little help with walking and/or transfers;A little help with  bathing/dressing/bathroom   Equipment Recommendations  BSC/3in1    Recommendations for Other Services      Precautions / Restrictions Precautions Precautions: Fall Restrictions Weight Bearing Restrictions: No       Mobility Bed Mobility Overal bed mobility: Needs Assistance Bed Mobility: Supine to Sit, Sit to Supine     Supine to sit: Mod assist, HOB elevated Sit to supine: Min guard        Transfers Overall transfer level: Needs assistance Equipment used: Rolling walker (2 wheels) Transfers: Sit to/from Stand Sit to Stand: Min guard                 Balance Overall balance assessment: Needs assistance Sitting-balance support: No upper extremity supported, Feet supported Sitting balance-Leahy Scale: Good     Standing balance support: Bilateral upper extremity supported, Single extremity supported Standing balance-Leahy Scale: Fair                             ADL either performed or assessed with clinical judgement   ADL Overall ADL's : Needs assistance/impaired                                       General ADL Comments: pt deferred grooming and toileting tasks this date    Extremity/Trunk Assessment Upper Extremity Assessment Upper Extremity Assessment: LUE deficits/detail;RUE deficits/detail RUE Deficits / Details: RUE shoulder flexion AROM to approx 1/4 full AROM, elbow/wrist appear WFL; Pt reports ROM is pain limited LUE Deficits / Details: RUE shoulder flexion AROM to approx 1/4 full AROM, elbow/wrist  appear WFL; Pt reports ROM is pain limited   Lower Extremity Assessment Lower Extremity Assessment: Overall WFL for tasks assessed        Vision Baseline Vision/History: 1 Wears glasses Patient Visual Report: No change from baseline     Perception     Praxis      Cognition Arousal/Alertness: Awake/alert Behavior During Therapy: WFL for tasks assessed/performed Overall Cognitive Status: Within Functional Limits  for tasks assessed                                          Exercises General Exercises - Upper Extremity Elbow Flexion: AROM, Both, 10 reps, Seated Elbow Extension: AROM, Both, 10 reps, Seated Wrist Flexion: AROM, Both, 10 reps, Seated Wrist Extension: AROM, Both, 10 reps, Seated Digit Composite Flexion: AROM, Both, 10 reps, Seated Composite Extension: AROM, Both, 10 reps, Seated General Exercises - Lower Extremity Hip ABduction/ADduction: AROM, Both, 10 reps, Seated Hip Flexion/Marching: AROM, Both, 10 reps, Seated    Shoulder Instructions       General Comments      Pertinent Vitals/ Pain       Pain Assessment Pain Assessment: Faces Faces Pain Scale: Hurts a little bit Pain Location: R sided chest wall and B shoulders Pain Descriptors / Indicators: Discomfort, Sore Pain Intervention(s): Limited activity within patient's tolerance, Monitored during session, Repositioned, Patient requesting pain meds-RN notified  Home Living                                          Prior Functioning/Environment              Frequency  Min 2X/week        Progress Toward Goals  OT Goals(current goals can now be found in the care plan section)  Progress towards OT goals: Progressing toward goals  Acute Rehab OT Goals Patient Stated Goal: return to PLOF OT Goal Formulation: With patient Time For Goal Achievement: 01/10/23 Potential to Achieve Goals: Good  Plan Discharge plan remains appropriate;Frequency remains appropriate    Co-evaluation                 AM-PAC OT "6 Clicks" Daily Activity     Outcome Measure   Help from another person eating meals?: None Help from another person taking care of personal grooming?: None Help from another person toileting, which includes using toliet, bedpan, or urinal?: A Little Help from another person bathing (including washing, rinsing, drying)?: A Little Help from another person to put on  and taking off regular upper body clothing?: None Help from another person to put on and taking off regular lower body clothing?: A Little 6 Click Score: 21    End of Session Equipment Utilized During Treatment: Rolling walker (2 wheels);Gait belt  OT Visit Diagnosis: Unsteadiness on feet (R26.81);Repeated falls (R29.6)   Activity Tolerance Patient tolerated treatment well   Patient Left in bed;with call bell/phone within reach;with bed alarm set;with family/visitor present   Nurse Communication Mobility status        Time: 1005-1017 OT Time Calculation (min): 12 min  Charges: OT General Charges $OT Visit: 1 Visit OT Treatments $Therapeutic Exercise: 8-22 mins  [facility] MS, OTR/L ascom [phone]  12/31/22, 12:53 PM

## 2023-01-01 ENCOUNTER — Telehealth: Payer: Self-pay

## 2023-01-01 ENCOUNTER — Ambulatory Visit (INDEPENDENT_AMBULATORY_CARE_PROVIDER_SITE_OTHER): Payer: PPO

## 2023-01-01 DIAGNOSIS — I255 Ischemic cardiomyopathy: Secondary | ICD-10-CM

## 2023-01-01 LAB — CUP PACEART REMOTE DEVICE CHECK
Battery Remaining Longevity: 29 mo
Battery Remaining Percentage: 33 %
Battery Voltage: 2.89 V
Brady Statistic AP VP Percent: 1 %
Brady Statistic AP VS Percent: 81 %
Brady Statistic AS VP Percent: 1 %
Brady Statistic AS VS Percent: 18 %
Brady Statistic RA Percent Paced: 79 %
Brady Statistic RV Percent Paced: 1 %
Date Time Interrogation Session: 20240207020017
HighPow Impedance: 73 Ohm
HighPow Impedance: 73 Ohm
Implantable Lead Connection Status: 753985
Implantable Lead Connection Status: 753985
Implantable Lead Implant Date: 20131218
Implantable Lead Implant Date: 20180514
Implantable Lead Location: 753859
Implantable Lead Location: 753860
Implantable Lead Model: 5076
Implantable Pulse Generator Implant Date: 20180514
Lead Channel Impedance Value: 360 Ohm
Lead Channel Impedance Value: 480 Ohm
Lead Channel Pacing Threshold Amplitude: 0.5 V
Lead Channel Pacing Threshold Amplitude: 1.25 V
Lead Channel Pacing Threshold Pulse Width: 0.5 ms
Lead Channel Pacing Threshold Pulse Width: 0.5 ms
Lead Channel Sensing Intrinsic Amplitude: 2.9 mV
Lead Channel Sensing Intrinsic Amplitude: 3.7 mV
Lead Channel Setting Pacing Amplitude: 2 V
Lead Channel Setting Pacing Amplitude: 2.5 V
Lead Channel Setting Pacing Pulse Width: 0.5 ms
Lead Channel Setting Sensing Sensitivity: 0.5 mV
Pulse Gen Serial Number: 1245762

## 2023-01-01 NOTE — Patient Outreach (Signed)
  Care Coordination TOC Note Transition Care Management Follow-up Telephone Call Date of discharge and from where: [facility] 12/31/22 How have you been since you were released from the hospital? Per patients wife, he is doing well, eating ans sleeping. Any questions or concerns? Yes- [facility] has not been in touch with patient yet.  This Probation officer contacted home [facility] and he was approved by the [facility] branch and they will be calling the patient.  I also informed patient about his custodial care benefit from [facility] [employer] and how to obtain the 20 hours of care with no cost to him.  Items Reviewed: Did the pt receive and understand the discharge instructions provided? Yes  Medications obtained and verified? Yes  Other? No  Any new allergies since your discharge? No  Dietary orders reviewed? Yes Do you have support at home? Yes   Home Care and Equipment/Supplies: Were home health services ordered? yes If so, what is the name of the agency? [facility]  Has the agency set up a time to come to the patient's home? Not yet but confirmed he will be called soon. Were any new equipment or medical supplies ordered?  No What is the name of the medical supply agency? N/a Were you able to get the supplies/equipment? no Do you have any questions related to the use of the equipment or supplies? No  Functional Questionnaire: (I = Independent and D = Dependent) ADLs: needs assistance  Bathing/Dressing- needs assistance  Meal Prep- D  Eating- I  Maintaining continence- I  Transferring/Ambulation- needs a lot of assistance, feels right leg is going to buckle  Managing Meds- D  Follow up appointments reviewed:  PCP Hospital f/u appt confirmed? No  -Going to [facility] f/u appt confirmed? Yes  Scheduled to see [place] Fus (pulmonary) on 01/14/23 @ 1:45. Are transportation arrangements needed? No  If their condition worsens, is the pt aware to call PCP or go to the  Emergency Dept.? Yes Was the patient provided with contact information for the PCP's office or ED? Yes Was to pt encouraged to call back with questions or concerns? Yes  SDOH assessments and interventions completed:   Yes SDOH Interventions Today    Flowsheet Row Most Recent Value  SDOH Interventions   Housing Interventions Intervention Not Indicated  Transportation Interventions Intervention Not Indicated       Care Coordination Interventions:  No Care Coordination interventions needed at this time.   Encounter Outcome:  Pt. Visit Completed

## 2023-01-01 NOTE — Patient Outreach (Signed)
  Care Coordination [facility] Note Transition Care Management Unsuccessful Follow-up Telephone Call  Date of discharge and from where:  [facility] 12/31/22  Attempts:  1st Attempt  Reason for unsuccessful TCM follow-up call:  Unable to leave message  Johnney Killian, RN, BSN, CCM Care Management Coordinator [facility] Phone: [phone]: [phone]

## 2023-01-09 ENCOUNTER — Ambulatory Visit (INDEPENDENT_AMBULATORY_CARE_PROVIDER_SITE_OTHER): Payer: PPO | Admitting: Internal Medicine

## 2023-01-09 VITALS — BP 116/60 | HR 70 | Temp 97.5°F | Ht 67.0 in | Wt 131.0 lb

## 2023-01-09 DIAGNOSIS — D649 Anemia, unspecified: Secondary | ICD-10-CM

## 2023-01-09 DIAGNOSIS — J939 Pneumothorax, unspecified: Secondary | ICD-10-CM | POA: Diagnosis not present

## 2023-01-09 DIAGNOSIS — E538 Deficiency of other specified B group vitamins: Secondary | ICD-10-CM | POA: Diagnosis not present

## 2023-01-09 DIAGNOSIS — I509 Heart failure, unspecified: Secondary | ICD-10-CM | POA: Diagnosis not present

## 2023-01-09 LAB — COMPREHENSIVE METABOLIC PANEL
ALT: 11 U/L (ref 0–53)
AST: 20 U/L (ref 0–37)
Albumin: 3.7 g/dL (ref 3.5–5.2)
Alkaline Phosphatase: 103 U/L (ref 39–117)
BUN: 23 mg/dL (ref 6–23)
CO2: 31 mEq/L (ref 19–32)
Calcium: 11.4 mg/dL — ABNORMAL HIGH (ref 8.4–10.5)
Chloride: 99 mEq/L (ref 96–112)
Creatinine, Ser: 1.12 mg/dL (ref 0.40–1.50)
GFR: 58.76 mL/min — ABNORMAL LOW (ref >60.00)
Glucose, Bld: 83 mg/dL (ref 70–99)
Potassium: 4.9 mEq/L (ref 3.5–5.1)
Sodium: 133 mEq/L — ABNORMAL LOW (ref 135–145)
Total Bilirubin: 0.3 mg/dL (ref 0.2–1.2)
Total Protein: 8.1 g/dL (ref 6.0–8.3)

## 2023-01-09 LAB — CBC WITH DIFFERENTIAL/PLATELET
Basophils Absolute: 0 10*3/uL (ref 0.0–0.1)
Basophils Relative: 0.4 % (ref 0.0–3.0)
Eosinophils Absolute: 0.3 10*3/uL (ref 0.0–0.7)
Eosinophils Relative: 3.9 % (ref 0.0–5.0)
HCT: 33.9 % — ABNORMAL LOW (ref 39.0–52.0)
Hemoglobin: 11.3 g/dL — ABNORMAL LOW (ref 13.0–17.0)
Lymphocytes Relative: 21.3 % (ref 12.0–46.0)
Lymphs Abs: 1.8 10*3/uL (ref 0.7–4.0)
MCHC: 33.3 g/dL (ref 30.0–36.0)
MCV: 91.5 fl (ref 78.0–100.0)
Monocytes Absolute: 0.6 10*3/uL (ref 0.1–1.0)
Monocytes Relative: 6.7 % (ref 3.0–12.0)
Neutro Abs: 5.6 10*3/uL (ref 1.4–7.7)
Neutrophils Relative %: 67.7 % (ref 43.0–77.0)
Platelets: 214 10*3/uL (ref 150.0–400.0)
RBC: 3.7 Mil/uL — ABNORMAL LOW (ref 4.22–5.81)
RDW: 14.2 % (ref 11.5–15.5)
WBC: 8.3 10*3/uL (ref 4.0–10.5)

## 2023-01-09 NOTE — Assessment & Plan Note (Addendum)
On Iron and Vit C Will check CBC after a few weeks.

## 2023-01-09 NOTE — Assessment & Plan Note (Signed)
On B12 

## 2023-01-09 NOTE — Progress Notes (Signed)
Subjective:  Patient ID: [name] Christensen, male    DOB: 1931/12/19  Age: 89 y.o. MRN: [id number]  CC: Hospitalization Follow-up (Feeling a lot better, passed OT yesterday. Reports he is a lot more stable than he was)   HPI [name] Christensen presents for post hospital OV S/p fall, pneumothorax R, rib fx R Nay is here with his wife and his son who help with history   "Date of admission: 12/26/2022             Date of discharge:  12/31/2022       Discharge Diagnoses:  Principal Problem:   Pneumothorax, traumatic Active Problems:   Fall   Pneumothorax on right   Anemia   CORONARY ARTERY BYPASS GRAFT, HX OF   Persistent atrial fibrillation (HCC)   Systolic and diastolic CHF, chronic (HCC)   BPH (benign prostatic hyperplasia)     Admitted From: Home Disposition:  Home with [facility] PT/OT/RN   Recommendations for Outpatient Follow-up:  Follow-up with PCP in 1 week, repeat iron profile after 3 to 6 months  Follow up LABS/TEST:  as above     Follow-up Information       [name] Fantasia, PA-C. Schedule an appointment as soon as possible for a visit in 2 week(s).   Specialty: Physician Assistant Why: Hospital follow up; right PTX...needs CXR prior to appointment Contact information: [address] [place] [address] [phone]                        Diet recommendation: Cardiac diet   Activity: The patient is advised to gradually reintroduce usual activities, as tolerated   Discharge Condition: stable   Code Status: Full code    History of present illness: As per the H and P dictated on admission   Hospital Course:  [name] Christensen is an 89 y.o. male brought to ed for He reports that he was helping his family members get something out of the trunk when he lost his balance and fell @ 10:30 am , striking his right hip and the right side of his chest on the ground, as well as his right side of his forehead Sustained trauma to forehead with scalp hematoma and no  loc or vomiting. PT able to ambulate . Take eliquis for his a.fibrillation.  Patient does not have any dizziness intermittently or presyncopal type of symptoms.  Xray shows Large right-sided pneumothorax and pt had chest tube placed in ed     Assessment and Plan: # Pneumothorax on right, s/p mechanical fall S/p chest tube insertion was done by ED physician, it got dislodged in the morning on 2/2 General surgery was consulted, chest tube was pulled out IR consulted to insert new chest tube 2/2 14 Fr tube placed in right pleural space. Majority of right pneumothorax resolved following tube placement. Continued prn pain meds and fall precautions.  General surgery was following.  2/3 CXR: Interval decrease in volume of right-sided pneumothorax. Right chest tube remains in place. Right base atelectasis versus airspace disease. 2/4 chest tube was discontinued with suction and connected to waterseal, awaiting for repeat chest x-ray at noon and then we will start Eliquis if lungs remain expanded. 2/5 chest tube was removed by general surgery, pneumothorax almost resolved.  Cleared to start Eliquis today. 2/6 CXR shows resolution of pneumothorax.  Patient was feeling better, agreed with the discharge planning. # CAD s/p CABG, hypertension, Continue Lipitor, irbesartan, metoprolol. # Persistent  chronic A-fib, rate controlled. Continued Tikosyn, Toprol-XL 50 mg twice daily 2/5 resumed Eliquis 5 mg p.o. twice daily # Isotonic hyponatremia, serum osmolality 282, Na 132---127--133-- gradually improving. 2/3 started sodium chloride tablets 1 g p.o. TID given during hospital stay.  Continue normal salt intake for few days at home and follow with PCP to recheck BMP after 1 to 2 weeks. # Anemia due to iron deficiency. Iron level 20, transferrin saturation 7% 2/2 started Venofer 200 mg IV daily for 5 days. Start oral iron supplement on discharge. Follow-up with PCP to repeat iron profile after 3 to 6 months. Folic  acid level and [id number] within normal range.  # Dementia and depression, continued Aricept, Lexapro home meds # BPH, continued home meds. Body mass index is 21.79 kg/m.  Nutrition Interventions:   Patient was seen by physical therapy, who recommended Home health, which was arranged. On the day of the discharge the patient's vitals were stable, and no other acute medical condition were reported by patient. the patient was felt safe to be discharge at Home with Home health.   Consultants: General surgery and IR Procedures: Right-sided chest tube insertion which was discontinued on 12/30/22   Discharge Exam: General: Appear in no distress, no Rash; Oral Mucosa Clear, moist. Cardiovascular: S1 and S2 Present, no Murmur, Respiratory: normal respiratory effort, Bilateral Air entry present and no Crackles, no wheezes Abdomen: Bowel Sound present, Soft and no tenderness, no hernia Extremities: no Pedal edema, no calf tenderness Neurology: alert and oriented to time, place, and person affect appropriate.        Filed Weights    12/29/22 0100 12/30/22 0500 12/31/22 0530  Weight: 61.4 kg 61.4 kg 63.1 kg        Vitals:    12/30/22 2051 12/31/22 0420  BP: 138/64 (!) 147/66  Pulse: 70 74  Resp: 15 17  Temp: 98.2 F (36.8 C) 98.1 F (36.7 C)  SpO2: 94% 95%      DISCHARGE MEDICATION: Allergies as of 12/31/2022         Reactions    Ezetimibe Other (See Comments)    Pt was in coma for 6 days, numbness    Penicillins Other (See Comments)    BLISTERS BETWEEN FINGERS Did it involve swelling of the face/tongue/throat, SOB, or low BP? No Did it involve sudden or severe rash/hives, skin peeling, or any reaction on the inside of your mouth or nose? Yes Did you need to seek medical attention at a hospital or doctor's office? No When did it last happen?      60 years If all above answers are "NO", may proceed with cephalosporin use.    Pravastatin Sodium Other (See Comments)    Aches and pains in  joints    Rosuvastatin Other (See Comments)    MYALGIAS    Niacin Anxiety, Other (See Comments)    "Makes me feel nervous, jittery"            Medication List       TAKE these medications     acetaminophen 500 MG tablet Commonly known as: TYLENOL Take 500 mg by mouth at bedtime.    alfuzosin 10 MG 24 hr tablet Commonly known as: UROXATRAL Take 10 mg by mouth every evening.    ascorbic acid 500 MG tablet Commonly known as: VITAMIN C Take 0.5 tablets (250 mg total) by mouth daily.    atorvastatin 40 MG tablet Commonly known as: LIPITOR Take 20 mg by mouth daily.  benzonatate 100 MG capsule Commonly known as: TESSALON Take 100 mg by mouth 3 (three) times daily as needed for cough.    betamethasone dipropionate 0.05 % cream Apply 1 application. topically 2 (two) times daily as needed.    cinacalcet 30 MG tablet Commonly known as: SENSIPAR Take 1 tablet (30 mg total) by mouth 3 (three) times a week.    cyanocobalamin 1000 MCG tablet Commonly known as: VITAMIN B12 Take 1,000 mcg by mouth daily.    dofetilide 250 MCG capsule Commonly known as: TIKOSYN TAKE 1 CAPSULE BY MOUTH TWICE A DAY    donepezil 5 MG tablet Commonly known as: ARICEPT Take 1 tablet (5 mg total) by mouth at bedtime.    dutasteride 0.5 MG capsule Commonly known as: AVODART Take 0.5 mg by mouth every evening.    Eliquis 5 MG Tabs tablet Generic drug: apixaban TAKE 1 TABLET BY MOUTH TWICE (2) DAILY What changed: See the new instructions.    escitalopram 10 MG tablet Commonly known as: LEXAPRO Take 1 tablet (10 mg total) by mouth at bedtime.    famotidine 20 MG tablet Commonly known as: PEPCID Take 20 mg by mouth daily.    HYDROcodone bit-homatropine 5-1.5 MG/5ML syrup Commonly known as: HYCODAN TAKE 5 MLS BY MOUTH EVERY 6 HOURS AS NEEDED FOR COUGH What changed: See the new instructions.    iron polysaccharides 150 MG capsule Commonly known as: NIFEREX Take 1 capsule (150 mg  total) by mouth daily.    metoprolol succinate 50 MG 24 hr tablet Commonly known as: TOPROL-XL TAKE 1 TABLET BY MOUTH TWICE A DAY. TAKEWITH OR IMMEDIATELY FOLLOWING A MEAL What changed:  how much to take how to take this when to take this    MUCINEX DM PO Take 30 mg by mouth in the morning and at bedtime.    multivitamin with minerals Tabs tablet Take 1 tablet by mouth every other day.    nitroGLYCERIN 0.4 MG SL tablet Commonly known as: NITROSTAT DISSOLVE 1 TABLET UNDER TONGUE AS NEEDEDFOR CHEST PAIN. MAY REPEAT 5 MINUTES APART 3 TIMES IF NEEDED    omeprazole 20 MG capsule Commonly known as: PRILOSEC Take 20 mg by mouth daily.    Plecanatide 3 MG Tabs Take 3 mg by mouth daily. Replaces (Linaclotide) Linzess    SYSTANE OP Place 1 drop into both eyes daily as needed (burning eyes).    triamcinolone cream 0.1 % Commonly known as: KENALOG APPLY 1 APPLICATION TOPICALLY TWO TIMES DAILY AS NEEDED AVOIDING FACE, GROIN ANDARMPITS What changed: See the new instructions.    valsartan 80 MG tablet Commonly known as: Diovan Take 1 tablet (80 mg total) by mouth daily. Take 1 tablet (80 mg) by mouth once daily      Outpatient Medications Prior to Visit  Medication Sig Dispense Refill   acetaminophen (TYLENOL) 500 MG tablet Take 500 mg by mouth at bedtime.     alfuzosin (UROXATRAL) 10 MG 24 hr tablet Take 10 mg by mouth every evening.      atorvastatin (LIPITOR) 40 MG tablet Take 20 mg by mouth daily.     benzonatate (TESSALON) 100 MG capsule Take 100 mg by mouth 3 (three) times daily as needed for cough.     betamethasone dipropionate 0.05 % cream Apply 1 application. topically 2 (two) times daily as needed.     cinacalcet (SENSIPAR) 30 MG tablet Take 1 tablet (30 mg total) by mouth 3 (three) times a week. 25 tablet 0   Dextromethorphan-guaiFENesin (  MUCINEX DM PO) Take 30 mg by mouth in the morning and at bedtime.     dofetilide (TIKOSYN) 250 MCG capsule TAKE 1 CAPSULE BY MOUTH  TWICE A DAY 180 capsule 1   donepezil (ARICEPT) 5 MG tablet Take 1 tablet (5 mg total) by mouth at bedtime. 90 tablet 3   dutasteride (AVODART) 0.5 MG capsule Take 0.5 mg by mouth every evening.      ELIQUIS 5 MG TABS tablet TAKE 1 TABLET BY MOUTH TWICE (2) DAILY (Patient taking differently: Take 5 mg by mouth 2 (two) times daily.) 180 tablet 3   escitalopram (LEXAPRO) 10 MG tablet Take 1 tablet (10 mg total) by mouth at bedtime. 90 tablet 1   famotidine (PEPCID) 20 MG tablet Take 20 mg by mouth daily.     HYDROcodone bit-homatropine (HYCODAN) 5-1.5 MG/5ML syrup TAKE 5 MLS BY MOUTH EVERY 6 HOURS AS NEEDED FOR COUGH (Patient taking differently: Take 5 mLs by mouth every 6 (six) hours as needed for cough.) 240 mL 0   iron polysaccharides (NIFEREX) 150 MG capsule Take 1 capsule (150 mg total) by mouth daily. 30 capsule 2   metoprolol succinate (TOPROL-XL) 50 MG 24 hr tablet TAKE 1 TABLET BY MOUTH TWICE A DAY. TAKEWITH OR IMMEDIATELY FOLLOWING A MEAL (Patient taking differently: Take 50 mg by mouth daily. TAKE 1 TABLET BY MOUTH TWICE A DAY. TAKEWITH OR IMMEDIATELY FOLLOWING A MEAL) 180 tablet 1   Multiple Vitamin (MULTIVITAMIN WITH MINERALS) TABS tablet Take 1 tablet by mouth every other day.     nitroGLYCERIN (NITROSTAT) 0.4 MG SL tablet DISSOLVE 1 TABLET UNDER TONGUE AS NEEDEDFOR CHEST PAIN. MAY REPEAT 5 MINUTES APART 3 TIMES IF NEEDED 25 tablet 3   omeprazole (PRILOSEC) 20 MG capsule Take 20 mg by mouth daily.     Plecanatide 3 MG TABS Take 3 mg by mouth daily. Replaces (Linaclotide) Linzess     Polyethyl Glycol-Propyl Glycol (SYSTANE OP) Place 1 drop into both eyes daily as needed (burning eyes).      triamcinolone cream (KENALOG) 0.1 % APPLY 1 APPLICATION TOPICALLY TWO TIMES DAILY AS NEEDED AVOIDING FACE, GROIN ANDARMPITS (Patient taking differently: Apply 1 Application topically 2 (two) times daily.) 80 g 0   valsartan (DIOVAN) 80 MG tablet Take 1 tablet (80 mg total) by mouth daily. Take 1 tablet  (80 mg) by mouth once daily 90 tablet 2   vitamin B-12 (CYANOCOBALAMIN) 1000 MCG tablet Take 1,000 mcg by mouth daily.     vitamin C (VITAMIN C) 500 MG tablet Take 0.5 tablets (250 mg total) by mouth daily. 15 tablet 2   No facility-administered medications prior to visit.  "  ROS: Review of Systems  Constitutional:  Positive for fatigue. Negative for appetite change and unexpected weight change.  HENT:  Negative for congestion, nosebleeds, sneezing, sore throat and trouble swallowing.   Eyes:  Negative for itching and visual disturbance.  Respiratory:  Negative for cough.   Cardiovascular:  Positive for chest pain. Negative for palpitations and leg swelling.  Gastrointestinal:  Negative for abdominal distention, blood in stool, diarrhea and nausea.  Genitourinary:  Negative for frequency and hematuria.  Musculoskeletal:  Negative for back pain, gait problem, joint swelling and neck pain.  Skin:  Negative for rash.  Neurological:  Negative for dizziness, tremors, syncope, speech difficulty and weakness.  Hematological:  Bruises/bleeds easily.  Psychiatric/Behavioral:  Positive for decreased concentration. Negative for agitation, dysphoric mood and sleep disturbance. The patient is not nervous/anxious.  Objective:  BP 116/60 (BP Location: Left Arm, Patient Position: Sitting, Cuff Size: Normal)   Pulse 70   Temp (!) 97.5 F (36.4 C) (Temporal)   Ht 5' 7"$  (1.702 m)   Wt 131 lb (59.4 kg)   SpO2 96%   BMI 20.52 kg/m   BP Readings from Last 3 Encounters:  01/12/23 (!) 148/76  01/09/23 116/60  12/31/22 (!) 147/66    Wt Readings from Last 3 Encounters:  01/12/23 130 lb 1.1 oz (59 kg)  01/09/23 131 lb (59.4 kg)  12/31/22 139 lb 1.8 oz (63.1 kg)    Physical Exam Constitutional:      General: He is not in acute distress.    Appearance: He is well-developed.     Comments: NAD  Eyes:     Conjunctiva/sclera: Conjunctivae normal.     Pupils: Pupils are equal, round, and  reactive to light.  Neck:     Thyroid: No thyromegaly.     Vascular: No JVD.  Cardiovascular:     Rate and Rhythm: Normal rate and regular rhythm.     Heart sounds: Normal heart sounds. No murmur heard.    No friction rub. No gallop.  Pulmonary:     Effort: Pulmonary effort is normal. No respiratory distress.     Breath sounds: Normal breath sounds. No wheezing or rales.  Chest:     Chest wall: No tenderness.  Abdominal:     General: Bowel sounds are normal. There is no distension.     Palpations: Abdomen is soft. There is no mass.     Tenderness: There is no abdominal tenderness. There is no guarding or rebound.  Musculoskeletal:        General: Tenderness present. Normal range of motion.     Cervical back: Normal range of motion.  Lymphadenopathy:     Cervical: No cervical adenopathy.  Skin:    General: Skin is warm and dry.     Findings: No rash.  Neurological:     Mental Status: He is alert and oriented to person, place, and time.     Cranial Nerves: No cranial nerve deficit.     Motor: No abnormal muscle tone.     Coordination: Coordination normal.     Gait: Gait normal.     Deep Tendon Reflexes: Reflexes are normal and symmetric.  Psychiatric:        Behavior: Behavior normal.        Thought Content: Thought content normal.     Lab Results  Component Value Date   WBC 8.3 01/09/2023   HGB 11.3 (L) 01/09/2023   HCT 33.9 (L) 01/09/2023   PLT 214.0 01/09/2023   GLUCOSE 83 01/09/2023   CHOL 93 09/30/2022   TRIG 113.0 09/30/2022   HDL 44.80 09/30/2022   LDLCALC 26 09/30/2022   ALT 11 01/09/2023   AST 20 01/09/2023   NA 133 (L) 01/09/2023   K 4.9 01/09/2023   CL 99 01/09/2023   CREATININE 1.12 01/09/2023   BUN 23 01/09/2023   CO2 31 01/09/2023   TSH 1.51 09/30/2022   PSA 12.45 (H) 02/06/2016   INR 1.1 04/02/2017   HGBA1C 6.1 12/30/2019    IR Catheter Tube Change  Result Date: 12/27/2022 INDICATION: 89 year old with recent traumatic pneumothorax. Recently  placed CT-guided chest tube. Follow-up chest radiograph raised concern that the tube may be within the minor fissure and plan for tube repositioning. EXAM: EXCHANGE AND REPOSITIONING OF RIGHT CHEST TUBE WITH FLUOROSCOPY MEDICATIONS: Fentanyl 25 mcg  ANESTHESIA/SEDATION: The patient's level of consciousness and vital signs were monitored continuously by radiology nursing throughout the procedure under my direct supervision. COMPLICATIONS: None immediate. PROCEDURE: Informed consent was obtained for chest tube replacement. All questions were addressed. Maximal Sterile Barrier Technique was utilized including caps, mask, sterile gowns, sterile gloves, sterile drape, hand hygiene and skin antiseptic. A timeout was performed prior to the initiation of the procedure. Patient was placed supine on the interventional table. The right chest and existing tube were prepped and draped in sterile fashion. The retention suture was cut. The catheter was cut and removed over a superstiff Amplatz wire. Five French catheter was used to manipulate the wire into the right upper chest near the apex. New 14 French multipurpose drain was advanced over the wire and the pigtail was reconstituted near the right lung apex. Chest tube aspirated air easily. Chest tube was sutured to skin and attached to the pleural evacuation canister. Fluoroscopic images were taken and saved for this procedure. FINDINGS: Old chest tube appeared to be near or within the right minor fissure. Wire placement confirmed that the tube was within the minor fissure. The wire was repositioned using a 5 French catheter and pigtail was placed in the right upper chest near the apex. IMPRESSION: Successful exchange and repositioning of the right chest tube with fluoroscopy. Electronically Signed   By: Markus Daft M.D.   On: 12/27/2022 16:58   CT [facility] PLEURAL DRAIN W/INDWELL CATH W/IMG GUIDE  Result Date: 12/27/2022 INDICATION: 89 year old with history of a traumatic  pneumothorax. Previous small bore right chest tube has been removed and the patient has recurrent right pneumothorax. Patient needs a new chest tube. EXAM: CT-GUIDED PLACEMENT OF RIGHT CHEST TUBE MEDICATIONS: Moderate sedation ANESTHESIA/SEDATION: Moderate (conscious) sedation was employed during this procedure. A total of Versed 0.35m and fentanyl 50 mcg was administered intravenously at the order of the provider performing the procedure. Total intra-service moderate sedation time: 12 minutes. Patient's level of consciousness and vital signs were monitored continuously by radiology nurse throughout the procedure under the supervision of the provider performing the procedure. COMPLICATIONS: None immediate. PROCEDURE: Informed written consent was obtained from the patient after a thorough discussion of the procedural risks, benefits and alternatives. All questions were addressed. A timeout was performed prior to the initiation of the procedure. Patient was placed supine on the CT scanner with right side slightly elevated. CT images through the chest were obtained. Large right pneumothorax was obtained. The right mid axillary region was prepped with chlorhexidine and sterile field was created. Skin was anesthetized with 1% lidocaine. A small incision was made. Using CT guidance, a Yueh catheter was directed to the pleural space and air was aspirated. Superstiff Amplatz wire was placed and directed into the pleural space. The tract was dilated to accommodate a 14 [place] drain. Chest tube was attached to a pleural fluid evacuation canister and attached to suction. Follow up CT images were obtained. Chest tube was slightly pulled back in order to improve suction. Follow up CT images were obtained. Chest tube was sutured to the skin. Bandages placed. RADIATION DOSE REDUCTION: This exam was performed according to the departmental dose-optimization program which includes automated exposure control, adjustment  of the mA and/or kV according to patient size and/or use of iterative reconstruction technique. FINDINGS: There was a very large right pneumothorax with compressive atelectasis in the right lower lobe and small right pleural effusion. Partial re-expansion of the right lung following chest tube placement. However, the chest  tube appears to be extending into the right minor fissure. IMPRESSION: CT-guided placement of right chest tube. Right pneumothorax decreased in size following chest tube placement. Electronically Signed   By: Markus Daft M.D.   On: 12/27/2022 13:57   DG Chest Port 1 View  Result Date: 12/27/2022 CLINICAL DATA:  Status post chest tube placement. EXAM: PORTABLE CHEST 1 VIEW COMPARISON:  Chest radiograph 12/27/2022 at 0735 hours. FINDINGS: 1250 hours. Interval placement of a right sided pleural drainage catheter with associated small amount of subcutaneous emphysema. Interval decrease in the size of the right-sided pneumothorax. Left chest AICD/biventricular pacer with leads projecting over the right atrium and right ventricle. Stable postoperative changes of median sternotomy and CABG. Stable prominent interstitial opacities and bibasilar atelectasis. IMPRESSION: Interval placement of a right-sided pleural drainage catheter with decreased size of the right pneumothorax. Electronically Signed   By: Emmit Alexanders M.D.   On: 12/27/2022 13:15   DG Chest 2 View  Result Date: 12/27/2022 CLINICAL DATA:  Pneumothorax on the right EXAM: CHEST - 2 VIEW COMPARISON:  Yesterday FINDINGS: Enlarged right pneumothorax now 6 cm in the apical midline, previously 1.2 cm. No midline shift. Generalized interstitial coarsening and mild reticulonodular airspace disease. Prior CABG. Dual-chamber ICD leads from the left. ASAP, these results will be called to the ordering clinician or representative by the Radiologist Assistant, and communication documented in the PACS or [employer]. A prelim was also sent in  epic chat directly to the team. IMPRESSION: 1. Progressed right pneumothorax which is now moderate to large. 2. Stable mild interstitial and airspace disease. Electronically Signed   By: Jorje Guild M.D.   On: 12/27/2022 07:54   DG Chest Portable 1 View  Result Date: 12/26/2022 CLINICAL DATA:  Chest tube placement EXAM: PORTABLE CHEST 1 VIEW COMPARISON:  Chest x-ray earlier same day FINDINGS: There is a new right-sided chest tube in place with distal tip projecting over the upper right lung. Small right pneumothorax is present which has significantly decreased in size now measuring 13 mm from the lung apex. There some residual patchy opacities in the right lung base. Patient is status post cardiac surgery. The heart is enlarged, unchanged. No mediastinal shift or pleural effusion. No acute fractures. IMPRESSION: 1. Small right pneumothorax has significantly decreased in size status post right chest tube placement. 2. Residual patchy opacities in the right lung base. Electronically Signed   By: Ronney Asters M.D.   On: 12/26/2022 20:58   DG Shoulder Right  Result Date: 12/26/2022 CLINICAL DATA:  Fall, pain EXAM: RIGHT SHOULDER - 2+ VIEW COMPARISON:  Chest radiograph dated 12/26/2022 FINDINGS: No fracture or dislocation of the shoulder. The visualized soft tissues are unremarkable. Large right pneumothorax, previously noted on chest radiograph. Associated nondisplaced fractures of the right anterior 4th and 5th ribs. IMPRESSION: Large right pneumothorax, previously noted on chest radiograph. Associated nondisplaced fractures of the right anterior 4th and 5th ribs. Electronically Signed   By: Julian Hy M.D.   On: 12/26/2022 19:23   CT Head Wo Contrast  Result Date: 12/26/2022 CLINICAL DATA:  Fall, right forehead hematoma EXAM: CT HEAD WITHOUT CONTRAST CT CERVICAL SPINE WITHOUT CONTRAST TECHNIQUE: Multidetector CT imaging of the head and cervical spine was performed following the standard protocol  without intravenous contrast. Multiplanar CT image reconstructions of the cervical spine were also generated. RADIATION DOSE REDUCTION: This exam was performed according to the departmental dose-optimization program which includes automated exposure control, adjustment of the mA and/or kV according to  patient size and/or use of iterative reconstruction technique. COMPARISON:  11/20/2022 FINDINGS: CT HEAD FINDINGS Brain: No evidence of acute infarction, hemorrhage, hydrocephalus, extra-axial collection or mass lesion/mass effect. Subcortical white matter and periventricular small vessel ischemic changes. Vascular: Intracranial atherosclerosis. Skull: Normal. Negative for fracture or focal lesion. Old right maxillary sinus fractures. Sinuses/Orbits: The visualized paranasal sinuses are essentially clear. The mastoid air cells are unopacified. Other: Soft tissue swelling/hematoma overlying the lateral right frontal bone (series 2/image 22). CT CERVICAL SPINE FINDINGS Alignment: Normal cervical lordosis. Skull base and vertebrae: No acute fracture. No primary bone lesion or focal pathologic process. Soft tissues and spinal canal: No prevertebral fluid or swelling. No visible canal hematoma. Disc levels: Mild degenerative changes at C4-5. Spinal canal is patent. Upper chest: Large right pneumothorax, noted on chest radiograph. Other: None. IMPRESSION: Soft tissue swelling/hematoma overlying the lateral right frontal bone. No evidence of calvarial fracture. No evidence of acute intracranial abnormality. Small vessel ischemic changes. No traumatic injury to the cervical spine. Large right pneumothorax, noted on chest radiograph. Electronically Signed   By: Julian Hy M.D.   On: 12/26/2022 19:20   CT Cervical Spine Wo Contrast  Result Date: 12/26/2022 CLINICAL DATA:  Fall, right forehead hematoma EXAM: CT HEAD WITHOUT CONTRAST CT CERVICAL SPINE WITHOUT CONTRAST TECHNIQUE: Multidetector CT imaging of the head and  cervical spine was performed following the standard protocol without intravenous contrast. Multiplanar CT image reconstructions of the cervical spine were also generated. RADIATION DOSE REDUCTION: This exam was performed according to the departmental dose-optimization program which includes automated exposure control, adjustment of the mA and/or kV according to patient size and/or use of iterative reconstruction technique. COMPARISON:  11/20/2022 FINDINGS: CT HEAD FINDINGS Brain: No evidence of acute infarction, hemorrhage, hydrocephalus, extra-axial collection or mass lesion/mass effect. Subcortical white matter and periventricular small vessel ischemic changes. Vascular: Intracranial atherosclerosis. Skull: Normal. Negative for fracture or focal lesion. Old right maxillary sinus fractures. Sinuses/Orbits: The visualized paranasal sinuses are essentially clear. The mastoid air cells are unopacified. Other: Soft tissue swelling/hematoma overlying the lateral right frontal bone (series 2/image 22). CT CERVICAL SPINE FINDINGS Alignment: Normal cervical lordosis. Skull base and vertebrae: No acute fracture. No primary bone lesion or focal pathologic process. Soft tissues and spinal canal: No prevertebral fluid or swelling. No visible canal hematoma. Disc levels: Mild degenerative changes at C4-5. Spinal canal is patent. Upper chest: Large right pneumothorax, noted on chest radiograph. Other: None. IMPRESSION: Soft tissue swelling/hematoma overlying the lateral right frontal bone. No evidence of calvarial fracture. No evidence of acute intracranial abnormality. Small vessel ischemic changes. No traumatic injury to the cervical spine. Large right pneumothorax, noted on chest radiograph. Electronically Signed   By: Julian Hy M.D.   On: 12/26/2022 19:20   DG Hip Unilat W or Wo Pelvis 2-3 Views Right  Result Date: 12/26/2022 CLINICAL DATA:  Fall EXAM: DG HIP (WITH OR WITHOUT PELVIS) 2-3V RIGHT COMPARISON:  None  Available. FINDINGS: No fracture or dislocation is seen. Mild degenerative changes of the right hip. Left hip joint space is preserved. Visualized bony pelvis appears intact. Degenerative changes of the lower lumbar spine. IMPRESSION: Negative. Electronically Signed   By: Julian Hy M.D.   On: 12/26/2022 19:16   DG Chest 1 View  Result Date: 12/26/2022 CLINICAL DATA:  Recent fall with chest pain, initial encounter EXAM: PORTABLE CHEST 1 VIEW COMPARISON:  11/19/2022 FINDINGS: Cardiac shadow is within normal limits. Defibrillator is again seen. Aortic calcifications are noted. Left lung is clear.  Right lung demonstrates a large pneumothorax with approximately 4.6 cm excursion at the apex. No definitive rib fracture is noted. IMPRESSION: Large right-sided pneumothorax Critical Value/emergent results were called by telephone at the time of interpretation on 12/26/2022 at 7:06 pm to Dr. Lavonia Drafts , who verbally acknowledged these results. Electronically Signed   By: [name] M.D.   On: 12/26/2022 19:07    Assessment & Plan:   Problem List Items Addressed This Visit       Cardiovascular and Mediastinum   Heart failure ([place])    Compensated.  Continue with Eliquis, valsartan        Respiratory   Pneumothorax on right    Traumatic, status post chest tube placement on the right.  Resolved.        Other   Vitamin B12 deficiency (non anemic) - Primary    On B12      Relevant Orders   Comprehensive metabolic panel (Completed)   Anemia    On Iron and Vit C Will check CBC after a few weeks.      Relevant Orders   CBC with Differential/Platelet (Completed)   Comprehensive metabolic panel (Completed)      No orders of the defined types were placed in this encounter.     Follow-up: Return in about 6 weeks (around 02/20/2023) for a follow-up visit.  Walker Kehr, MD

## 2023-01-12 ENCOUNTER — Emergency Department: Payer: PPO

## 2023-01-12 ENCOUNTER — Encounter: Payer: Self-pay | Admitting: Emergency Medicine

## 2023-01-12 ENCOUNTER — Emergency Department
Admission: EM | Admit: 2023-01-12 | Discharge: 2023-01-12 | Disposition: A | Discharge status: Home / Self Care | Payer: PPO | Attending: Emergency Medicine | Admitting: Emergency Medicine

## 2023-01-12 DIAGNOSIS — M2578 Osteophyte, vertebrae: Secondary | ICD-10-CM | POA: Diagnosis not present

## 2023-01-12 DIAGNOSIS — S0990XA Unspecified injury of head, initial encounter: Secondary | ICD-10-CM | POA: Diagnosis not present

## 2023-01-12 DIAGNOSIS — I2510 Atherosclerotic heart disease of native coronary artery without angina pectoris: Secondary | ICD-10-CM | POA: Insufficient documentation

## 2023-01-12 DIAGNOSIS — S0083XA Contusion of other part of head, initial encounter: Secondary | ICD-10-CM | POA: Diagnosis not present

## 2023-01-12 DIAGNOSIS — Z043 Encounter for examination and observation following other accident: Secondary | ICD-10-CM | POA: Diagnosis not present

## 2023-01-12 DIAGNOSIS — Y92002 Bathroom of unspecified non-institutional (private) residence single-family (private) house as the place of occurrence of the external cause: Secondary | ICD-10-CM | POA: Insufficient documentation

## 2023-01-12 DIAGNOSIS — S199XXA Unspecified injury of neck, initial encounter: Secondary | ICD-10-CM | POA: Diagnosis not present

## 2023-01-12 DIAGNOSIS — S0101XA Laceration without foreign body of scalp, initial encounter: Secondary | ICD-10-CM | POA: Diagnosis not present

## 2023-01-12 DIAGNOSIS — W1811XA Fall from or off toilet without subsequent striking against object, initial encounter: Secondary | ICD-10-CM | POA: Diagnosis not present

## 2023-01-12 DIAGNOSIS — I10 Essential (primary) hypertension: Secondary | ICD-10-CM | POA: Diagnosis not present

## 2023-01-12 DIAGNOSIS — R9082 White matter disease, unspecified: Secondary | ICD-10-CM | POA: Diagnosis not present

## 2023-01-12 DIAGNOSIS — I509 Heart failure, unspecified: Secondary | ICD-10-CM | POA: Insufficient documentation

## 2023-01-12 MED ORDER — LIDOCAINE-EPINEPHRINE 2 %-1:100000 IJ SOLN
20.0000 mL | Freq: Once | INTRAMUSCULAR | Status: AC
  Administered 2023-01-12: 20 mL via INTRADERMAL
  Filled 2023-01-12: qty 1

## 2023-01-12 NOTE — ED Provider Notes (Signed)
[facility] Provider Note    Event Date/Time   First MD Initiated Contact with Patient 01/12/23 1504     (approximate)   History   Laceration and Fall   HPI  [name] Christensen is a 89 y.o. male with a history of CHF, AICD placement, CAD, hypertension, hyperlipidemia, and GERD who presents with a scalp laceration after a fall.  The patient states that he was walking to the bathroom, lost his balance, and fell.  He hit his head but did not lose consciousness.  He reports localized pain where the laceration is but denies headache, dizziness, or vomiting.  He denies any neck or back pain or any other injuries.  He had a recent fall with a pneumothorax but has no shortness of breath or chest pain at this time.  I reviewed the past medical records.  The patient was admitted earlier this month with a traumatic right-sided pneumothorax after a fall that was treated with a chest tube.  Chest x-ray on 2/6 showed resolved pneumothorax.   Physical Exam   Triage Vital Signs: ED Triage Vitals  Enc Vitals Group     BP 01/12/23 1250 (!) 163/77     Pulse Rate 01/12/23 1250 70     Resp 01/12/23 1250 20     Temp 01/12/23 1250 (!) 97.5 F (36.4 C)     Temp Source 01/12/23 1250 Oral     SpO2 01/12/23 1250 97 %     Weight 01/12/23 1252 130 lb 1.1 oz (59 kg)     Height 01/12/23 1252 5' 7"$  (1.702 m)     Head Circumference --      Peak Flow --      Pain Score 01/12/23 1250 7     Pain Loc --      Pain Edu? --      Excl. in [place]? --     Most recent vital signs: Vitals:   01/12/23 1250 01/12/23 1658  BP: (!) 163/77 (!) 152/74  Pulse: 70 68  Resp: 20 19  Temp: (!) 97.5 F (36.4 C) 98 F (36.7 C)  SpO2: 97% 98%     General: Awake, no distress.  CV:  Good peripheral perfusion.  Resp:  Normal effort.  Lungs CTAB, symmetric breath sounds. Abd:  No distention.  Other:  Left parietal 5 cm scalp laceration.  No midline cervical spinal tenderness.  EOMI.  PERRLA.  Motor  intact in all extremities.  Normal speech.  Normal coordination.   ED Results / Procedures / Treatments   Labs (all labs ordered are listed, but only abnormal results are displayed) Labs Reviewed - No data to display   EKG     RADIOLOGY  CT head: I independently viewed and interpreted the images; there is no ICH.  Radiology report indicates no acute abnormality.  CT cervical spine: No acute fracture  Chest x-ray: I independently viewed and interpreted the images; there is no focal consolidation, edema, or pneumothorax.  PROCEDURES:  Critical Care performed: No  ..Laceration Repair  Date/Time: 01/12/2023 5:07 PM  Performed by: Arta Silence, MD Authorized by: Arta Silence, MD   Consent:    Consent obtained:  Verbal   Consent given by:  Patient Universal protocol:    Patient identity confirmed:  Verbally with patient Anesthesia:    Anesthesia method:  Local infiltration   Local anesthetic:  Lidocaine 2% WITH epi Laceration details:    Location:  Scalp   Scalp location:  L  parietal   Length (cm):  5 Exploration:    Wound extent: fascia not violated and no underlying fracture     Contaminated: no   Treatment:    Area cleansed with:  Povidone-iodine and saline   Amount of cleaning:  Extensive   Irrigation method:  Syringe   Debridement:  None Skin repair:    Repair method:  Staples   Number of staples:  7 Approximation:    Approximation:  Close Repair type:    Repair type:  Simple Post-procedure details:    Dressing:  Open (no dressing)   Procedure completion:  Tolerated well, no immediate complications    MEDICATIONS ORDERED IN ED: Medications  lidocaine-EPINEPHrine (XYLOCAINE W/EPI) 2 %-1:100000 (with pres) injection 20 mL (20 mLs Intradermal Given by Other 01/12/23 1618)     IMPRESSION / MDM / [place] / ED COURSE  I reviewed the triage vital signs and the nursing notes.  89 year old male with PMH as noted above presents  with a scalp laceration after mechanical fall from standing height.  He has no other injuries.  He recently had a fall causing a pneumothorax although has no respiratory symptoms at this time.  Neurologic exam is nonfocal.  CT head and cervical spine are negative.  I obtained a chest x-ray to rule out recurrent pneumothorax and is also negative.  Differential diagnosis includes, but is not limited to, minor head injury, concussion.  The patient is on Eliquis.  We will repair the laceration with staples.  Patient's presentation is most consistent with acute presentation with potential threat to life or bodily function.  ----------------------------------------- 5:09 PM on 01/12/2023 -----------------------------------------  Laceration repaired without complication.  The patient is stable for discharge.  Return precautions given, and he and his family member expressed understanding.   FINAL CLINICAL IMPRESSION(S) / ED DIAGNOSES   Final diagnoses:  Laceration of scalp, initial encounter  Injury of head, initial encounter     Rx / DC Orders   ED Discharge Orders     None        Note:  This document was prepared using Dragon voice recognition software and may include unintentional dictation errors.    Arta Silence, MD 01/12/23 1710

## 2023-01-12 NOTE — ED Triage Notes (Signed)
Pt fell today at home, believes he hit on on a trunk. Pt said he got up with his walker and walked a few steps but become off balance. Laceration to left side of head. Recently admitted for a fall and had collapsed lung.

## 2023-01-13 ENCOUNTER — Telehealth: Payer: Self-pay | Admitting: Physician Assistant

## 2023-01-13 ENCOUNTER — Encounter: Payer: Self-pay | Admitting: Internal Medicine

## 2023-01-13 NOTE — Telephone Encounter (Signed)
Left detailed message- per Thedore Mins- CXR looks good -no pneumothorax. Her for you in the future if you need anything.

## 2023-01-13 NOTE — Assessment & Plan Note (Signed)
Compensated.  Continue with Eliquis, valsartan

## 2023-01-13 NOTE — Assessment & Plan Note (Signed)
Traumatic, status post chest tube placement on the right.  Resolved.

## 2023-01-13 NOTE — Telephone Encounter (Signed)
Patient was scheduled for hospital follow up on 01/15/23.  Wife states that over the weekend her husband fell and lacerated his head.  They went to the ER, he is now with staples.  Patient while at the ED had a chest xray done.  Wants to know if [name] Christensen can take a look at the xray and if all looks good, wants to know if they still need to come in.  Please advise. Thank you.

## 2023-01-14 ENCOUNTER — Inpatient Hospital Stay: Payer: PPO | Admitting: Physician Assistant

## 2023-01-16 ENCOUNTER — Telehealth: Payer: Self-pay

## 2023-01-16 ENCOUNTER — Telehealth: Payer: Self-pay | Admitting: Internal Medicine

## 2023-01-16 NOTE — Telephone Encounter (Signed)
     Patient  visit on 2/18  at [facility]    Have you been able to follow up with your primary care physician? Yes   The patient was or was not able to obtain any needed medicine or equipment. Yes   Are there diet recommendations that you are having difficulty following? Na   Patient expresses understanding of discharge instructions and education provided has no other needs at this time.  Yes      [place] [phone] [address], [place], [place] [address] Phone: [phone] Email: [email or web address]

## 2023-01-16 NOTE — Telephone Encounter (Signed)
Gigi Gin with [facility] Stated pt Bp readings upon arrival   Arrival: 120/60 Standing up: 112/56 After exercise: 118/70 After walking: 118/62

## 2023-01-20 ENCOUNTER — Ambulatory Visit (INDEPENDENT_AMBULATORY_CARE_PROVIDER_SITE_OTHER): Payer: PPO | Admitting: Family Medicine

## 2023-01-20 ENCOUNTER — Encounter: Payer: Self-pay | Admitting: Family Medicine

## 2023-01-20 VITALS — BP 130/62 | HR 64 | Temp 97.7°F | Resp 20 | Ht 67.0 in | Wt 132.0 lb

## 2023-01-20 DIAGNOSIS — Z4802 Encounter for removal of sutures: Secondary | ICD-10-CM

## 2023-01-20 DIAGNOSIS — Z9181 History of falling: Secondary | ICD-10-CM

## 2023-01-20 NOTE — Progress Notes (Signed)
Assessment & Plan:  1. Encounter for removal of sutures Laceration site cleansed with normal saline. 7 sutures removed. Cleansed with normal saline again and patted dry. No bleeding. Patient tolerated well.   2. History of recent fall Encouraged to continue using walker for safety and to prevent future falls.    Follow up plan: Return if symptoms worsen or fail to improve.  Hendricks Limes, MSN, APRN, FNP-C  Subjective:  HPI: [name] Christensen is a 89 y.o. male presenting on 01/20/2023 for Suture / Staple Removal (Lac to scalp last Sunday - went to [facility] for staples. )  Patient is accompanied by his wife, who he is okay with being present.  He is here to have staples removed from his scalp that were placed 8 days ago at [facility] after a fall.  Denies any issues with the staples, bleeding, swelling, and/or pain.   ROS: Negative unless specifically indicated above in HPI.   Relevant past medical history reviewed and updated as indicated.   Allergies and medications reviewed and updated.   Current Outpatient Medications:    acetaminophen (TYLENOL) 500 MG tablet, Take 500 mg by mouth at bedtime., Disp: , Rfl:    alfuzosin (UROXATRAL) 10 MG 24 hr tablet, Take 10 mg by mouth every evening. , Disp: , Rfl:    atorvastatin (LIPITOR) 40 MG tablet, Take 20 mg by mouth daily., Disp: , Rfl:    betamethasone dipropionate 0.05 % cream, Apply 1 application. topically 2 (two) times daily as needed., Disp: , Rfl:    cinacalcet (SENSIPAR) 30 MG tablet, Take 1 tablet (30 mg total) by mouth 3 (three) times a week., Disp: 25 tablet, Rfl: 0   Dextromethorphan-guaiFENesin (MUCINEX DM PO), Take 30 mg by mouth in the morning and at bedtime., Disp: , Rfl:    dofetilide (TIKOSYN) 250 MCG capsule, TAKE 1 CAPSULE BY MOUTH TWICE A DAY, Disp: 180 capsule, Rfl: 1   donepezil (ARICEPT) 5 MG tablet, Take 1 tablet (5 mg total) by mouth at bedtime., Disp: 90 tablet, Rfl: 3   dutasteride (AVODART) 0.5 MG capsule, Take 0.5 mg  by mouth every evening. , Disp: , Rfl:    ELIQUIS 5 MG TABS tablet, TAKE 1 TABLET BY MOUTH TWICE (2) DAILY (Patient taking differently: Take 5 mg by mouth 2 (two) times daily.), Disp: 180 tablet, Rfl: 3   escitalopram (LEXAPRO) 10 MG tablet, Take 1 tablet (10 mg total) by mouth at bedtime., Disp: 90 tablet, Rfl: 1   famotidine (PEPCID) 20 MG tablet, Take 20 mg by mouth daily., Disp: , Rfl:    HYDROcodone bit-homatropine (HYCODAN) 5-1.5 MG/5ML syrup, TAKE 5 MLS BY MOUTH EVERY 6 HOURS AS NEEDED FOR COUGH (Patient taking differently: Take 5 mLs by mouth every 6 (six) hours as needed for cough.), Disp: 240 mL, Rfl: 0   iron polysaccharides (NIFEREX) 150 MG capsule, Take 1 capsule (150 mg total) by mouth daily., Disp: 30 capsule, Rfl: 2   metoprolol succinate (TOPROL-XL) 50 MG 24 hr tablet, TAKE 1 TABLET BY MOUTH TWICE A DAY. TAKEWITH OR IMMEDIATELY FOLLOWING A MEAL (Patient taking differently: Take 50 mg by mouth daily. TAKE 1 TABLET BY MOUTH TWICE A DAY. TAKEWITH OR IMMEDIATELY FOLLOWING A MEAL), Disp: 180 tablet, Rfl: 1   Multiple Vitamin (MULTIVITAMIN WITH MINERALS) TABS tablet, Take 1 tablet by mouth every other day., Disp: , Rfl:    omeprazole (PRILOSEC) 20 MG capsule, Take 20 mg by mouth daily., Disp: , Rfl:    Plecanatide 3 MG  TABS, Take 3 mg by mouth daily. Replaces (Linaclotide) Linzess, Disp: , Rfl:    Polyethyl Glycol-Propyl Glycol (SYSTANE OP), Place 1 drop into both eyes daily as needed (burning eyes). , Disp: , Rfl:    triamcinolone cream (KENALOG) 0.1 %, APPLY 1 APPLICATION TOPICALLY TWO TIMES DAILY AS NEEDED AVOIDING FACE, GROIN ANDARMPITS (Patient taking differently: Apply 1 Application topically 2 (two) times daily.), Disp: 80 g, Rfl: 0   valsartan (DIOVAN) 80 MG tablet, Take 1 tablet (80 mg total) by mouth daily. Take 1 tablet (80 mg) by mouth once daily, Disp: 90 tablet, Rfl: 2   vitamin B-12 (CYANOCOBALAMIN) 1000 MCG tablet, Take 1,000 mcg by mouth daily., Disp: , Rfl:    vitamin C  (VITAMIN C) 500 MG tablet, Take 0.5 tablets (250 mg total) by mouth daily., Disp: 15 tablet, Rfl: 2   benzonatate (TESSALON) 100 MG capsule, Take 100 mg by mouth 3 (three) times daily as needed for cough. (Patient not taking: Reported on 01/20/2023), Disp: , Rfl:    nitroGLYCERIN (NITROSTAT) 0.4 MG SL tablet, DISSOLVE 1 TABLET UNDER TONGUE AS NEEDEDFOR CHEST PAIN. MAY REPEAT 5 MINUTES APART 3 TIMES IF NEEDED (Patient not taking: Reported on 01/20/2023), Disp: 25 tablet, Rfl: 3  Allergies  Allergen Reactions   Ezetimibe Other (See Comments)    Pt was in coma for 6 days, numbness   Penicillins Other (See Comments)    BLISTERS BETWEEN FINGERS Did it involve swelling of the face/tongue/throat, SOB, or low BP? No Did it involve sudden or severe rash/hives, skin peeling, or any reaction on the inside of your mouth or nose? Yes Did you need to seek medical attention at a hospital or doctor's office? No When did it last happen?      60 years If all above answers are "NO", may proceed with cephalosporin use.    Pravastatin Sodium Other (See Comments)    Aches and pains in joints   Rosuvastatin Other (See Comments)    MYALGIAS   Niacin Anxiety and Other (See Comments)    "Makes me feel nervous, jittery"    Objective:   BP 130/62   Pulse 64   Temp 97.7 F (36.5 C)   Resp 20   Ht 5' 7"$  (1.702 m)   Wt 132 lb (59.9 kg)   BMI 20.67 kg/m    Physical Exam Vitals reviewed.  Constitutional:      General: He is not in acute distress.    Appearance: Normal appearance. He is not ill-appearing, toxic-appearing or diaphoretic.  HENT:     Head: Normocephalic and atraumatic.  Eyes:     General: No scleral icterus.       Right eye: No discharge.        Left eye: No discharge.     Conjunctiva/sclera: Conjunctivae normal.  Cardiovascular:     Rate and Rhythm: Normal rate.  Pulmonary:     Effort: Pulmonary effort is normal. No respiratory distress.  Musculoskeletal:        General: Normal range  of motion.     Cervical back: Normal range of motion.  Skin:    General: Skin is warm and dry.     Findings: Laceration (well approximated with 7 staples to scalp; no erythema, swelling, warmth, or drainage) present.  Neurological:     Mental Status: He is alert and oriented to person, place, and time. Mental status is at baseline.     Gait: Gait abnormal (ambulated with a rolling walker).  Psychiatric:  Mood and Affect: Mood normal.        Behavior: Behavior normal.        Thought Content: Thought content normal.        Judgment: Judgment normal.

## 2023-01-21 ENCOUNTER — Telehealth: Payer: Self-pay | Admitting: Internal Medicine

## 2023-01-21 MED ORDER — VALSARTAN 80 MG PO TABS: 40.0000 mg | ORAL_TABLET | Freq: Every day | ORAL | 2 refills | Status: DC

## 2023-01-21 MED ORDER — METOPROLOL SUCCINATE ER 50 MG PO TB24: 25.0000 mg | ORAL_TABLET | Freq: Two times a day (BID) | ORAL | 1 refills | Status: DC

## 2023-01-21 NOTE — Telephone Encounter (Signed)
Noted.  Reduce Toprol-XL to 25 mg twice a day.  Reduce Diovan to 40 mg once a day.  Thank you

## 2023-01-21 NOTE — Telephone Encounter (Signed)
Cheryl from [employer] called and wanted to inform Dr. Alain Marion of patient's bp. She would like a callback about what to do about his bp being low. Bp from 01/21/23 am listed below 98/50 sitting bp 80/48 standing bp  Callback number is [phone] secure

## 2023-01-22 NOTE — Telephone Encounter (Signed)
Notified [name] Christensen w/ MD response.Marland KitchenJohny Christensen

## 2023-01-28 NOTE — Progress Notes (Signed)
Remote ICD transmission.   

## 2023-01-31 ENCOUNTER — Encounter: Payer: Self-pay | Admitting: Internal Medicine

## 2023-02-12 ENCOUNTER — Other Ambulatory Visit: Payer: Self-pay | Admitting: Internal Medicine

## 2023-02-13 ENCOUNTER — Telehealth: Payer: Self-pay | Admitting: Internal Medicine

## 2023-02-13 NOTE — Telephone Encounter (Signed)
Prescription Request  02/13/2023  LOV: 01/09/2023  What is the name of the medication or equipment? HYDROcodone bit-homatropine (HYCODAN) 5-1.5 MG/5ML syrup   Have you contacted your pharmacy to request a refill? No   Which pharmacy would you like this sent to?    Patient notified that their request is being sent to the clinical staff for review and that they should receive a response within 2 business days.    [place] [place] [address] [place], [place] [address] Phone: [phone]  Fax: [phone] lease advise at   Mobile [phone] (mobile)

## 2023-02-15 MED ORDER — HYDROCODONE BIT-HOMATROP MBR 5-1.5 MG/5ML PO SOLN: 5.0000 mL | Freq: Four times a day (QID) | ORAL | 0 refills | Status: DC | PRN

## 2023-02-15 NOTE — Telephone Encounter (Signed)
Okay.  Keep return office visit.  Thanks 

## 2023-02-20 ENCOUNTER — Encounter: Payer: Self-pay | Admitting: Internal Medicine

## 2023-02-20 ENCOUNTER — Ambulatory Visit (INDEPENDENT_AMBULATORY_CARE_PROVIDER_SITE_OTHER): Payer: PPO | Admitting: Internal Medicine

## 2023-02-20 VITALS — BP 120/70 | HR 72 | Temp 97.8°F | Ht 67.0 in | Wt 130.0 lb

## 2023-02-20 DIAGNOSIS — E785 Hyperlipidemia, unspecified: Secondary | ICD-10-CM

## 2023-02-20 DIAGNOSIS — R413 Other amnesia: Secondary | ICD-10-CM | POA: Diagnosis not present

## 2023-02-20 DIAGNOSIS — I10 Essential (primary) hypertension: Secondary | ICD-10-CM | POA: Diagnosis not present

## 2023-02-20 DIAGNOSIS — E538 Deficiency of other specified B group vitamins: Secondary | ICD-10-CM

## 2023-02-20 LAB — CBC WITH DIFFERENTIAL/PLATELET
Basophils Absolute: 0 10*3/uL (ref 0.0–0.1)
Basophils Relative: 0.4 % (ref 0.0–3.0)
Eosinophils Absolute: 0.4 10*3/uL (ref 0.0–0.7)
Eosinophils Relative: 5.4 % — ABNORMAL HIGH (ref 0.0–5.0)
HCT: 35 % — ABNORMAL LOW (ref 39.0–52.0)
Hemoglobin: 11.7 g/dL — ABNORMAL LOW (ref 13.0–17.0)
Lymphocytes Relative: 25 % (ref 12.0–46.0)
Lymphs Abs: 1.8 10*3/uL (ref 0.7–4.0)
MCHC: 33.4 g/dL (ref 30.0–36.0)
MCV: 92.3 fl (ref 78.0–100.0)
Monocytes Absolute: 0.7 10*3/uL (ref 0.1–1.0)
Monocytes Relative: 9.4 % (ref 3.0–12.0)
Neutro Abs: 4.2 10*3/uL (ref 1.4–7.7)
Neutrophils Relative %: 59.8 % (ref 43.0–77.0)
Platelets: 165 10*3/uL (ref 150.0–400.0)
RBC: 3.79 Mil/uL — ABNORMAL LOW (ref 4.22–5.81)
RDW: 14.2 % (ref 11.5–15.5)
WBC: 7.1 10*3/uL (ref 4.0–10.5)

## 2023-02-20 LAB — COMPREHENSIVE METABOLIC PANEL
ALT: 10 U/L (ref 0–53)
AST: 19 U/L (ref 0–37)
Albumin: 3.9 g/dL (ref 3.5–5.2)
Alkaline Phosphatase: 105 U/L (ref 39–117)
BUN: 24 mg/dL — ABNORMAL HIGH (ref 6–23)
CO2: 30 mEq/L (ref 19–32)
Calcium: 11.5 mg/dL — ABNORMAL HIGH (ref 8.4–10.5)
Chloride: 98 mEq/L (ref 96–112)
Creatinine, Ser: 1.09 mg/dL (ref 0.40–1.50)
GFR: 60.66 mL/min (ref >60.00)
Glucose, Bld: 95 mg/dL (ref 70–99)
Potassium: 4.5 mEq/L (ref 3.5–5.1)
Sodium: 132 mEq/L — ABNORMAL LOW (ref 135–145)
Total Bilirubin: 0.4 mg/dL (ref 0.2–1.2)
Total Protein: 8 g/dL (ref 6.0–8.3)

## 2023-02-20 LAB — VITAMIN B12: Vitamin B-12: 1478 pg/mL — ABNORMAL HIGH (ref 211–911)

## 2023-02-20 LAB — TSH: TSH: 2.52 u[IU]/mL (ref 0.35–5.50)

## 2023-02-20 MED ORDER — VALSARTAN 40 MG PO TABS: 40.0000 mg | ORAL_TABLET | Freq: Every day | ORAL | 3 refills | Status: DC

## 2023-02-20 NOTE — Assessment & Plan Note (Signed)
On B12 

## 2023-02-20 NOTE — Assessment & Plan Note (Signed)
On Aricept 

## 2023-02-20 NOTE — Progress Notes (Signed)
Subjective:  Patient ID: [name] Christensen, male    DOB: [birth year]  Age: 89 y.o. MRN: [id number]  CC: Follow-up (6 week f/u, discuss iron supplement and Vitamin C supp)   HPI [name] Christensen presents for HTN w/orthostatc BP drop yesterday of 60 points on SBP: no sx's F/u on B12 def, dyslipidemia   Outpatient Medications Prior to Visit  Medication Sig Dispense Refill   acetaminophen (TYLENOL) 500 MG tablet Take 500 mg by mouth at bedtime.     alfuzosin (UROXATRAL) 10 MG 24 hr tablet Take 10 mg by mouth every evening.      atorvastatin (LIPITOR) 40 MG tablet Take 20 mg by mouth daily.     betamethasone dipropionate 0.05 % cream Apply 1 application. topically 2 (two) times daily as needed.     cinacalcet (SENSIPAR) 30 MG tablet Take 1 tablet (30 mg total) by mouth 3 (three) times a week. 25 tablet 0   Dextromethorphan-guaiFENesin (MUCINEX DM PO) Take 30 mg by mouth in the morning and at bedtime.     dofetilide (TIKOSYN) 250 MCG capsule TAKE 1 CAPSULE BY MOUTH TWICE A DAY 180 capsule 1   donepezil (ARICEPT) 5 MG tablet Take 1 tablet (5 mg total) by mouth at bedtime. 90 tablet 3   dutasteride (AVODART) 0.5 MG capsule Take 0.5 mg by mouth every evening.      ELIQUIS 5 MG TABS tablet TAKE 1 TABLET BY MOUTH TWICE (2) DAILY (Patient taking differently: Take 5 mg by mouth 2 (two) times daily.) 180 tablet 3   escitalopram (LEXAPRO) 10 MG tablet Take 1 tablet (10 mg total) by mouth at bedtime. 90 tablet 1   famotidine (PEPCID) 20 MG tablet Take 20 mg by mouth daily.     iron polysaccharides (NIFEREX) 150 MG capsule Take 1 capsule (150 mg total) by mouth daily. 30 capsule 2   metoprolol succinate (TOPROL-XL) 50 MG 24 hr tablet Take 0.5 tablets (25 mg total) by mouth 2 (two) times daily. 180 tablet 1   Multiple Vitamin (MULTIVITAMIN WITH MINERALS) TABS tablet Take 1 tablet by mouth every other day.     nitroGLYCERIN (NITROSTAT) 0.4 MG SL tablet DISSOLVE 1 TABLET UNDER TONGUE AS NEEDEDFOR CHEST PAIN.  MAY REPEAT 5 MINUTES APART 3 TIMES IF NEEDED 25 tablet 3   omeprazole (PRILOSEC) 20 MG capsule Take 20 mg by mouth daily.     Plecanatide 3 MG TABS Take 3 mg by mouth daily. Replaces (Linaclotide) Linzess     Polyethyl Glycol-Propyl Glycol (SYSTANE OP) Place 1 drop into both eyes daily as needed (burning eyes).      triamcinolone cream (KENALOG) 0.1 % APPLY 1 APPLICATION TOPICALLY TWO TIMES DAILY AS NEEDED AVOIDING FACE, GROIN ANDARMPITS (Patient taking differently: Apply 1 Application topically 2 (two) times daily.) 80 g 0   vitamin B-12 (CYANOCOBALAMIN) 1000 MCG tablet Take 1,000 mcg by mouth daily.     vitamin C (VITAMIN C) 500 MG tablet Take 0.5 tablets (250 mg total) by mouth daily. 15 tablet 2   valsartan (DIOVAN) 80 MG tablet Take 0.5 tablets (40 mg total) by mouth daily. Take 1 tablet (80 mg) by mouth once daily 90 tablet 2   benzonatate (TESSALON) 100 MG capsule Take 100 mg by mouth 3 (three) times daily as needed for cough. (Patient not taking: Reported on 01/20/2023)     HYDROcodone bit-homatropine (HYCODAN) 5-1.5 MG/5ML syrup Take 5 mLs by mouth every 6 (six) hours as needed for cough. 240 mL 0  No facility-administered medications prior to visit.    ROS: Review of Systems  Constitutional:  Positive for fatigue. Negative for appetite change and unexpected weight change.  HENT:  Negative for congestion, nosebleeds, sneezing, sore throat and trouble swallowing.   Eyes:  Negative for itching and visual disturbance.  Respiratory:  Negative for cough.   Cardiovascular:  Negative for chest pain, palpitations and leg swelling.  Gastrointestinal:  Negative for abdominal distention, blood in stool, diarrhea and nausea.  Genitourinary:  Negative for frequency and hematuria.  Musculoskeletal:  Negative for back pain, gait problem, joint swelling and neck pain.  Skin:  Negative for rash.  Neurological:  Positive for light-headedness. Negative for dizziness, tremors, speech difficulty and  weakness.  Hematological:  Bruises/bleeds easily.  Psychiatric/Behavioral:  Positive for decreased concentration. Negative for agitation, confusion, dysphoric mood and sleep disturbance. The patient is not nervous/anxious.     Objective:  BP 120/70 (BP Location: Left Arm, Patient Position: Sitting, Cuff Size: Normal)   Pulse 72   Temp 97.8 F (36.6 C) (Oral)   Ht 5\' 7"  (1.702 m)   Wt 130 lb (59 kg)   SpO2 97%   BMI 20.36 kg/m   BP Readings from Last 3 Encounters:  02/20/23 120/70  01/20/23 130/62  01/12/23 (!) 148/76    Wt Readings from Last 3 Encounters:  02/20/23 130 lb (59 kg)  01/20/23 132 lb (59.9 kg)  01/12/23 130 lb 1.1 oz (59 kg)    Physical Exam Constitutional:      General: He is not in acute distress.    Appearance: Normal appearance. He is well-developed.     Comments: NAD  Eyes:     Conjunctiva/sclera: Conjunctivae normal.     Pupils: Pupils are equal, round, and reactive to light.  Neck:     Thyroid: No thyromegaly.     Vascular: No JVD.  Cardiovascular:     Rate and Rhythm: Normal rate and regular rhythm.     Heart sounds: Normal heart sounds. No murmur heard.    No friction rub. No gallop.  Pulmonary:     Effort: Pulmonary effort is normal. No respiratory distress.     Breath sounds: Normal breath sounds. No wheezing or rales.  Chest:     Chest wall: No tenderness.  Abdominal:     General: Bowel sounds are normal. There is no distension.     Palpations: Abdomen is soft. There is no mass.     Tenderness: There is no abdominal tenderness. There is no guarding or rebound.  Musculoskeletal:        General: No tenderness. Normal range of motion.     Cervical back: Normal range of motion.  Lymphadenopathy:     Cervical: No cervical adenopathy.  Skin:    General: Skin is warm and dry.     Findings: No rash.  Neurological:     Mental Status: He is alert and oriented to person, place, and time.     Cranial Nerves: No cranial nerve deficit.      Motor: No abnormal muscle tone.     Coordination: Coordination normal.     Gait: Gait normal.     Deep Tendon Reflexes: Reflexes are normal and symmetric.  Psychiatric:        Behavior: Behavior normal.        Thought Content: Thought content normal.        Judgment: Judgment normal.    No sx's when standing up Scar on scalp is healed  Lab Results  Component Value Date   WBC 8.3 01/09/2023   HGB 11.3 (L) 01/09/2023   HCT 33.9 (L) 01/09/2023   PLT 214.0 01/09/2023   GLUCOSE 83 01/09/2023   CHOL 93 09/30/2022   TRIG 113.0 09/30/2022   HDL 44.80 09/30/2022   LDLCALC 26 09/30/2022   ALT 11 01/09/2023   AST 20 01/09/2023   NA 133 (L) 01/09/2023   K 4.9 01/09/2023   CL 99 01/09/2023   CREATININE 1.12 01/09/2023   BUN 23 01/09/2023   CO2 31 01/09/2023   TSH 1.51 09/30/2022   PSA 12.45 (H) 02/06/2016   INR 1.1 04/02/2017   HGBA1C 6.1 12/30/2019    DG Chest 2 View  Result Date: 01/12/2023 CLINICAL DATA:  Fall. Concern for recurrent right pneumothorax. Patient reports no difficulty breathing at this time. EXAM: CHEST - 2 VIEW COMPARISON:  Chest radiograph 12/31/2022, 12/30/2022, 12/29/2022; CT chest 11/20/2022 FINDINGS: The heart size and mediastinal contours are within normal limits. Left chest wall AICD with leads overlying the right atrium and right ventricle. Postoperative changes of median sternotomy and coronary artery bypass graft. Aortic calcifications. No pneumothorax. Chronic basilar interstitial thickening and hyperinflation. No new focal consolidation no pleural effusion. Multilevel degenerative changes in the thoracic spine. Surgical clips overlie the right upper abdomen no acute osseous abnormality. Old fracture of the anterior right fourth rib. IMPRESSION: No acute cardiopulmonary abnormality. Specifically, no pneumothorax as queried. Electronically Signed   By: Ileana Roup M.D.   On: 01/12/2023 16:45   CT Head Wo Contrast  Result Date: 01/12/2023 CLINICAL DATA:   Fall EXAM: CT HEAD WITHOUT CONTRAST CT CERVICAL SPINE WITHOUT CONTRAST TECHNIQUE: Multidetector CT imaging of the head and cervical spine was performed following the standard protocol without intravenous contrast. Multiplanar CT image reconstructions of the cervical spine were also generated. RADIATION DOSE REDUCTION: This exam was performed according to the departmental dose-optimization program which includes automated exposure control, adjustment of the mA and/or kV according to patient size and/or use of iterative reconstruction technique. COMPARISON:  12/26/2022 FINDINGS: CT HEAD FINDINGS Brain: No evidence of acute infarction, hemorrhage, hydrocephalus, extra-axial collection or mass lesion/mass effect. Periventricular and deep white matter hypodensity. Vascular: No hyperdense vessel or unexpected calcification. Skull: Normal. Negative for fracture or focal lesion. Sinuses/Orbits: No acute finding. Other: Soft tissue hematoma of the right forehead (series 2, image 41) CT CERVICAL SPINE FINDINGS Alignment: Normal. Skull base and vertebrae: No acute fracture. No primary bone lesion or focal pathologic process. Soft tissues and spinal canal: No prevertebral fluid or swelling. No visible canal hematoma. Disc levels: Moderate disc space height loss and osteophytosis of C3-C5 with otherwise mild disc degenerative change. Upper chest: Negative. Other: None. IMPRESSION: 1. No acute intracranial pathology. Small-vessel white matter disease. 2. Soft tissue hematoma of the right forehead. 3. No fracture or static subluxation of the cervical spine. Electronically Signed   By: Delanna Ahmadi M.D.   On: 01/12/2023 14:33   CT Cervical Spine Wo Contrast  Result Date: 01/12/2023 CLINICAL DATA:  Fall EXAM: CT HEAD WITHOUT CONTRAST CT CERVICAL SPINE WITHOUT CONTRAST TECHNIQUE: Multidetector CT imaging of the head and cervical spine was performed following the standard protocol without intravenous contrast. Multiplanar CT  image reconstructions of the cervical spine were also generated. RADIATION DOSE REDUCTION: This exam was performed according to the departmental dose-optimization program which includes automated exposure control, adjustment of the mA and/or kV according to patient size and/or use of iterative reconstruction technique. COMPARISON:  12/26/2022 FINDINGS: CT  HEAD FINDINGS Brain: No evidence of acute infarction, hemorrhage, hydrocephalus, extra-axial collection or mass lesion/mass effect. Periventricular and deep white matter hypodensity. Vascular: No hyperdense vessel or unexpected calcification. Skull: Normal. Negative for fracture or focal lesion. Sinuses/Orbits: No acute finding. Other: Soft tissue hematoma of the right forehead (series 2, image 41) CT CERVICAL SPINE FINDINGS Alignment: Normal. Skull base and vertebrae: No acute fracture. No primary bone lesion or focal pathologic process. Soft tissues and spinal canal: No prevertebral fluid or swelling. No visible canal hematoma. Disc levels: Moderate disc space height loss and osteophytosis of C3-C5 with otherwise mild disc degenerative change. Upper chest: Negative. Other: None. IMPRESSION: 1. No acute intracranial pathology. Small-vessel white matter disease. 2. Soft tissue hematoma of the right forehead. 3. No fracture or static subluxation of the cervical spine. Electronically Signed   By: Delanna Ahmadi M.D.   On: 01/12/2023 14:33    Assessment & Plan:   Problem List Items Addressed This Visit       Cardiovascular and Mediastinum   Essential hypertension - Primary    Chronic orthostatic BP drop, mostly asymptomatic On Valsartan 40 mg qd now Toprol 1/2 bid (25 mg) Compression socks Hydrate well      Relevant Medications   valsartan (DIOVAN) 40 MG tablet   Other Relevant Orders   CBC with Differential/Platelet   TSH   Comprehensive metabolic panel     Other   Dyslipidemia    Chronic  On Lipitor      Relevant Orders   CBC with  Differential/Platelet   TSH   Comprehensive metabolic panel   Memory problem    On Aricept      Vitamin B12 deficiency (non anemic)    On B12      Relevant Orders   Vitamin B12      Meds ordered this encounter  Medications   valsartan (DIOVAN) 40 MG tablet    Sig: Take 1 tablet (40 mg total) by mouth daily.    Dispense:  90 tablet    Refill:  3      Follow-up: Return in about 6 weeks (around 04/03/2023) for a follow-up visit.  Walker Kehr, MD

## 2023-02-20 NOTE — Assessment & Plan Note (Signed)
Chronic orthostatic BP drop, mostly asymptomatic On Valsartan 40 mg qd now Toprol 1/2 bid (25 mg) Compression socks Hydrate well

## 2023-02-20 NOTE — Assessment & Plan Note (Signed)
Chronic On Lipitor 

## 2023-03-05 ENCOUNTER — Other Ambulatory Visit: Payer: Self-pay | Admitting: Internal Medicine

## 2023-03-24 ENCOUNTER — Telehealth: Payer: Self-pay | Admitting: Internal Medicine

## 2023-03-24 NOTE — Telephone Encounter (Signed)
Contacted [name] Christensen to schedule their annual wellness visit. Appointment made for 03/27/2023.  [facility] TEAM Direct Dial: [phone]

## 2023-03-27 ENCOUNTER — Ambulatory Visit (INDEPENDENT_AMBULATORY_CARE_PROVIDER_SITE_OTHER): Payer: PPO

## 2023-03-27 VITALS — Ht 67.0 in | Wt 130.0 lb

## 2023-03-27 DIAGNOSIS — Z0000 Encounter for general adult medical examination without abnormal findings: Secondary | ICD-10-CM | POA: Diagnosis not present

## 2023-03-27 NOTE — Progress Notes (Addendum)
I connected with  [name] Christensen on 03/27/23 by a audio enabled telemedicine application and verified that I am speaking with the correct person using two identifiers.  Patient Location: Home  Provider Location: Office/Clinic  I discussed the limitations of evaluation and management by telemedicine. The patient expressed understanding and agreed to proceed.  Subjective:   [name] Christensen is a 89 y.o. male who presents for Medicare Annual/Subsequent preventive examination.  Review of Systems     Cardiac Risk Factors include: advanced age (>25men, >24 women);dyslipidemia;family history of premature cardiovascular disease;hypertension;male gender     Objective:    Today's Vitals   03/27/23 0936  Weight: 130 lb (59 kg)  Height: 5\' 7"  (1.702 m)  PainSc: 0-No pain   Body mass index is 20.36 kg/m.     03/27/2023    9:38 AM 01/12/2023   12:52 PM 12/27/2022   12:00 AM 12/26/2022    6:22 PM 11/19/2022   10:36 PM 08/07/2022    4:02 PM 04/02/2022   11:01 AM  Advanced Directives  Does Patient Have a Medical Advance Directive? Yes No No No No No Yes  Type of Estate agent of [place];Living will      Living will;Healthcare Power of Attorney  Does patient want to make changes to medical advance directive?       No - Patient declined  Copy of Healthcare Power of Attorney in Chart? No - copy requested      No - copy requested  Would patient like information on creating a medical advance directive?   No - Patient declined        Current Medications (verified) Outpatient Encounter Medications as of 03/27/2023  Medication Sig   acetaminophen (TYLENOL) 500 MG tablet Take 500 mg by mouth at bedtime.   alfuzosin (UROXATRAL) 10 MG 24 hr tablet Take 10 mg by mouth every evening.    atorvastatin (LIPITOR) 40 MG tablet Take 20 mg by mouth daily.   betamethasone dipropionate 0.05 % cream Apply 1 application. topically 2 (two) times daily as needed.   cinacalcet (SENSIPAR) 30 MG  tablet Take 1 tablet (30 mg total) by mouth 3 (three) times a week.   Dextromethorphan-guaiFENesin (MUCINEX DM PO) Take 30 mg by mouth in the morning and at bedtime.   dofetilide (TIKOSYN) 250 MCG capsule TAKE 1 CAPSULE BY MOUTH TWICE A DAY   donepezil (ARICEPT) 5 MG tablet TAKE ONE TABLET BY MOUTH EVERY NIGHT AT BEDTIME   dutasteride (AVODART) 0.5 MG capsule Take 0.5 mg by mouth every evening.    ELIQUIS 5 MG TABS tablet TAKE 1 TABLET BY MOUTH TWICE (2) DAILY (Patient taking differently: Take 5 mg by mouth 2 (two) times daily.)   escitalopram (LEXAPRO) 10 MG tablet Take 1 tablet (10 mg total) by mouth at bedtime.   famotidine (PEPCID) 20 MG tablet Take 20 mg by mouth daily.   iron polysaccharides (NIFEREX) 150 MG capsule Take 1 capsule (150 mg total) by mouth daily.   metoprolol succinate (TOPROL-XL) 50 MG 24 hr tablet Take 0.5 tablets (25 mg total) by mouth 2 (two) times daily.   Multiple Vitamin (MULTIVITAMIN WITH MINERALS) TABS tablet Take 1 tablet by mouth every other day.   nitroGLYCERIN (NITROSTAT) 0.4 MG SL tablet DISSOLVE 1 TABLET UNDER TONGUE AS NEEDEDFOR CHEST PAIN. MAY REPEAT 5 MINUTES APART 3 TIMES IF NEEDED   omeprazole (PRILOSEC) 20 MG capsule Take 20 mg by mouth daily.   Plecanatide 3 MG TABS Take 3 mg  by mouth daily. Replaces (Linaclotide) Linzess   Polyethyl Glycol-Propyl Glycol (SYSTANE OP) Place 1 drop into both eyes daily as needed (burning eyes).    triamcinolone cream (KENALOG) 0.1 % APPLY 1 APPLICATION TOPICALLY TWO TIMES DAILY AS NEEDED AVOIDING FACE, GROIN ANDARMPITS (Patient taking differently: Apply 1 Application topically 2 (two) times daily.)   valsartan (DIOVAN) 40 MG tablet Take 1 tablet (40 mg total) by mouth daily.   vitamin B-12 (CYANOCOBALAMIN) 1000 MCG tablet Take 1,000 mcg by mouth daily.   vitamin C (VITAMIN C) 500 MG tablet Take 0.5 tablets (250 mg total) by mouth daily.   No facility-administered encounter medications on file as of 03/27/2023.     Allergies (verified) Ezetimibe, Penicillins, Pravastatin sodium, Rosuvastatin, and Niacin   History: Past Medical History:  Diagnosis Date   Actinic keratosis 06/09/2019   L sup forehead at hairline - bx proven   AICD (automatic cardioverter/defibrillator) present 04/07/2017   Allergy    Anxiety    BPH (benign prostatic hypertrophy)    elevated PSA Dr. Lindell Noe Bx 2010   CAP (community acquired pneumonia) 11/18/2012   rx levaquin 12/14/12  - esr 51 12/31/2012 and no eos  05/2021 likely a COVID complication.  Prescribed cefdinir antibiotic, cough syrup with codeine, Medrol Dosepak  1/23 CAP --sx's - 80+% better. CXR: COPD changes with chronic basilar interstitial disease and suspect  superimposed acute infiltrate at RIGHT base.   Cardiac arrest [facility])    a. out-of-hospital arrest 11/15/2012 - EF 40-45%, patent grafts on cath, received [employer] AICD.   Carotid disease, bilateral (HCC)    a. 0-39% by doppers.   Cataract    bil cataracts removed   Chest pain, atypical 07/21/2013   CHF (congestive heart failure) (HCC)    Chills with fever 11/24/2017   12/18 Recurrent - ?prostatitis  1/19 ?URI  6/20    Chronic calculous cholecystitis 06/05/2017   Chronic diastolic CHF (congestive heart failure) (HCC) 03/09/2018   10/19 Probable pacemaker cardiomyopathy per Dr Graciela Husbands w/a plan to   1) INCREASE cozaar (losartan) 50 mg - take 1 tablet by mouth once daily - d/c. Losartan was stopped on 05/12/19 due to high K  2) START mexiletine 150 mg- take 1 tablet by mouth TWICE daily   Coronary artery disease    a. s/p MI/CABG 2005. b. s/p cath at time of VF arrest 10/2013 - grafts patent.   Cough    Diverticulosis    Elevated LFTs    a. 10/2012 felt due to cardiac arrest - Hepatitis C Ab reactive from 11/18/2012>>Hep C RNA PCR negative 10/26/2012.    GERD (gastroesophageal reflux disease)    Hyperlipidemia    Hypertension    Internal hemorrhoids    Myocardial infarction (HCC)    2005 - CABG x 5 2005    Paroxysmal atrial fibrillation (HCC)    Continue on Eliquis, Toprol, Tikosyn  Dr Graciela Husbands   RBBB    Tubular adenoma of colon    Unstable angina (HCC)    Upper airway cough syndrome 03/26/2022   Onset ? Jan 2023   - try off Entresto  03/25/2022   - max gerd rx 08/01/2022 >>>       Ventricular bigeminy    a. Event monitor 01/2013: NSR with PVCs and occ bigeminy.   Weakness 04/09/2016   5/17 ?post-viral vs other, 3/18, 6/20, 1/23  CFS  He is very active for his age  22/23  Post-concussion  Take Lexapro and Toprol qhs  Reduce  Toprol to 1 qhs   Past Surgical History:  Procedure Laterality Date   APPENDECTOMY     CARDIAC CATHETERIZATION  2007   with patent graft anatomy atretic left internal mammary  artery to the LAD which is nonobstructive. Will restart study June 08, 2007   CARDIOVERSION N/A 03/27/2018   Procedure: CARDIOVERSION;  Surgeon: Iran Ouch, MD;  Location: [facility] ORS;  Service: Cardiovascular;  Laterality: N/A;   CARDIOVERSION N/A 01/04/2019   Procedure: CARDIOVERSION (CATH LAB);  Surgeon: Iran Ouch, MD;  Location: [facility] ORS;  Service: Cardiovascular;  Laterality: N/A;   CARDIOVERSION N/A 07/07/2019   Procedure: CARDIOVERSION;  Surgeon: Wendall Stade, MD;  Location: [facility] ENDOSCOPY;  Service: Cardiovascular;  Laterality: N/A;   CATARACT EXTRACTION W/PHACO Right 04/23/2016   Procedure: CATARACT EXTRACTION PHACO AND INTRAOCULAR LENS PLACEMENT (IOC);  Surgeon: [name], MD;  Location: [facility] ORS;  Service: Ophthalmology;  Laterality: Right;  [place] 1.09 AP% 19.3 CDE 13.38 Fluid pack lot # [id number]   CHOLECYSTECTOMY N/A 06/05/2017   Procedure: LAPAROSCOPIC CHOLECYSTECTOMY WITH INTRAOPERATIVE CHOLANGIOGRAM ERAS PATHWAY POSSIBLE NEEDLE CORE BIOPSY OF LIVER;  Surgeon: [name], MD;  Location: [facility] OR;  Service: General;  Laterality: N/A;  ERAS PATHWAY   COLONOSCOPY     CORONARY ARTERY BYPASS GRAFT  2005   EYE SURGERY     ICD GENERATOR CHANGEOUT N/A 04/07/2017   Procedure: ICD  Generator Changeout;  Surgeon: Duke Salvia, MD;  Location: [facility] INVASIVE CV LAB;  Service: Cardiovascular;  Laterality: N/A;   IMPLANTABLE CARDIOVERTER DEFIBRILLATOR IMPLANT N/A 11/17/2012   STJ single chamber ICD implanted by Dr Graciela Husbands for cardiac arrest    INGUINAL HERNIA REPAIR Bilateral 01/17/2015   Procedure: BILATERAL LAPAROSCOPIC INGUINAL HERNIA REPAIR WITH LEFT FEMORAL HERNIA REPAIR;  Surgeon: [name], MD;  Location: [facility] OR;  Service: General;  Laterality: Bilateral;   INSERTION OF MESH Bilateral 01/17/2015   Procedure: INSERTION OF MESH;  Surgeon: [name], MD;  Location: [facility] OR;  Service: General;  Laterality: Bilateral;   IR CATHETER TUBE CHANGE  12/27/2022   LAPAROSCOPIC CHOLECYSTECTOMY  06/05/2017   LEAD INSERTION N/A 04/07/2017   Procedure: RA Lead Insertion;  Surgeon: Duke Salvia, MD;  Location: [facility] INVASIVE CV LAB;  Service: Cardiovascular;  Laterality: N/A;   LEAD REVISION/REPAIR N/A 04/08/2017   Procedure: Atrial Lead Revision/Repair;  Surgeon: Duke Salvia, MD;  Location: [facility] INVASIVE CV LAB;  Service: Cardiovascular;  Laterality: N/A;   LEFT HEART CATH AND CORONARY ANGIOGRAPHY Left 05/14/2019   Procedure: LEFT HEART CATH AND CORONARY ANGIOGRAPHY;  Surgeon: Iran Ouch, MD;  Location: [facility] INVASIVE CV LAB;  Service: Cardiovascular;  Laterality: Left;   LEFT HEART CATHETERIZATION WITH CORONARY/GRAFT ANGIOGRAM N/A 11/20/2012   Procedure: LEFT HEART CATHETERIZATION WITH Isabel Caprice;  Surgeon: [name], MD;  Location: [facility] CATH LAB;  Service: Cardiovascular;  Laterality: N/A;   LIVER BIOPSY N/A 06/05/2017   Procedure: SINGLE SITE LIVER BIOPSY ERAS PATHWAY;  Surgeon: [name], MD;  Location: [facility] OR;  Service: General;  Laterality: N/A;  ERAS PATHWAY   LUMBAR FUSION  09/2007   Family History  Problem Relation Age of Onset   Coronary artery disease Mother    Heart attack Brother    Stomach cancer Neg Hx    Colon cancer Neg Hx    Esophageal cancer  Neg Hx    Pancreatic cancer Neg Hx    Prostate cancer Neg Hx    Rectal cancer Neg Hx    Hypercalcemia  Neg Hx    Social History   Socioeconomic History   Marital status: Married    Spouse name: Not on file   Number of children: 5   Years of education: Not on file   Highest education level: Not on file  Occupational History   Occupation: Realtor    Employer: Advertising copywriter  Tobacco Use   Smoking status: Never    Passive exposure: Past   Smokeless tobacco: Never  Vaping Use   Vaping Use: Never used  Substance and Sexual Activity   Alcohol use: No   Drug use: No   Sexual activity: Not Currently  Other Topics Concern   Not on file  Social History Narrative   Not on file   Social Determinants of Health   Financial Resource Strain: Low Risk  (03/27/2023)   Overall Financial Resource Strain (CARDIA)    Difficulty of Paying Living Expenses: Not hard at all  Food Insecurity: No Food Insecurity (03/27/2023)   Hunger Vital Sign    Worried About Running Out of Food in the Last Year: Never true    Ran Out of Food in the Last Year: Never true  Transportation Needs: No Transportation Needs (03/27/2023)   PRAPARE - Administrator, Civil Service (Medical): No    Lack of Transportation (Non-Medical): No  Physical Activity: Sufficiently Active (03/27/2023)   Exercise Vital Sign    Days of Exercise per Week: 5 days    Minutes of Exercise per Session: 30 min  Stress: No Stress Concern Present (03/27/2023)   [employer] of Occupational Health - Occupational Stress Questionnaire    Feeling of Stress : Not at all  Social Connections: Socially Integrated (03/27/2023)   Social Connection and Isolation Panel [NHANES]    Frequency of Communication with Friends and Family: More than three times a week    Frequency of Social Gatherings with Friends and Family: Once a week    Attends Religious Services: More than 4 times per year    Active Member of [employer] or Organizations: No     Attends Engineer, structural: More than 4 times per year    Marital Status: Married    Tobacco Counseling Counseling given: Not Answered   Clinical Intake:  Pre-visit preparation completed: Yes  Pain : No/denies pain Pain Score: 0-No pain     BMI - recorded: 20.36 Nutritional Status: BMI of 19-24  Normal Nutritional Risks: None Diabetes: No  How often do you need to have someone help you when you read instructions, pamphlets, or other written materials from your doctor or pharmacy?: 1 - Never 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 No  Interpreter Needed?: No  Information entered by :: Susie Cassette, LPN.   Activities of Daily Living    03/27/2023    9:40 AM 12/27/2022   12:00 AM  In your present state of health, do you have any difficulty performing the following activities:  Hearing? 1 1  Comment  bil hearing aids  Vision? 0 0  Difficulty concentrating or making decisions? 0 0  Walking or climbing stairs? 0 0  Dressing or bathing? 0 0  Doing errands, shopping? 0 0  Preparing Food and eating ? N   Using the Toilet? N   In the past six months, have you accidently leaked urine? N   Do you have problems with loss of bowel control? N   Managing your Medications? N   Managing  your Finances? N   Housekeeping or managing your Housekeeping? N     Patient Care Team: Plotnikov, Georgina Quint, MD as PCP - General (Internal Medicine) Wendall Stade, MD as PCP - Cardiology (Cardiology) Duke Salvia, MD as PCP - Electrophysiology (Cardiology) Lindell Noe Lehman Prom., MD (Urology) [name], MD as Consulting Physician (General Surgery) Pyrtle, Carie Caddy, MD as Consulting Physician (Gastroenterology) [name], MD as Referring Physician (Ophthalmology) Nyoka Cowden, MD as Consulting Physician (Pulmonary Disease) Romero Belling, MD (Inactive) as Consulting Physician (Endocrinology)  Indicate any recent  Medical Services you may have received from other than [facility] providers in the past year (date may be approximate).     Assessment:   This is a routine wellness examination for [employer].  Hearing/Vision screen Hearing Screening - Comments:: Patient wears hearing aids. Vision Screening - Comments:: Wears rx glasses - up to date with routine eye exams with [facility]   Dietary issues and exercise activities discussed: Current Exercise Habits: Home exercise routine, Type of exercise: walking;treadmill;stretching;strength training/weights, Time (Minutes): 60, Frequency (Times/Week): 4, Weekly Exercise (Minutes/Week): 240, Intensity: Moderate, Exercise limited by: cardiac condition(s);orthopedic condition(s)   Goals Addressed   None   Depression Screen    03/27/2023    9:40 AM 02/20/2023   10:30 AM 01/20/2023   10:41 AM 07/03/2022   10:19 AM 05/22/2022    9:00 AM 04/02/2022   10:01 AM 01/30/2022   10:40 AM  PHQ 2/9 Scores  PHQ - 2 Score 0 0 0 0 0 0 0  PHQ- 9 Score 1   2  2      Fall Risk    03/27/2023    9:39 AM 02/20/2023   10:29 AM 01/20/2023   10:41 AM 08/19/2022   10:27 AM 07/03/2022   10:19 AM  Fall Risk   Falls in the past year? 1 1 1 1 1   Number falls in past yr: 0 0 0 1 0  Injury with Fall? 1 1 1 1 1   Comment    Pt stateshe fell 2 weeks ago trip [place] [employer] at [employer]. He is bruised on (R) face, shoulder   Risk for fall due to : History of fall(s);Impaired balance/gait;Orthopedic patient History of fall(s);Impaired balance/gait History of fall(s) Impaired balance/gait;Impaired mobility History of fall(s)  Follow up Education provided;Falls prevention discussed Falls evaluation completed Falls evaluation completed  Falls evaluation completed    FALL RISK PREVENTION PERTAINING TO THE HOME:  Any stairs in or around the home? Yes  If so, are there any without handrails? No  Home free of loose throw rugs in walkways, pet beds, electrical cords, etc? Yes  Adequate lighting in  your home to reduce risk of falls? Yes   ASSISTIVE DEVICES UTILIZED TO PREVENT FALLS:  Life alert? No  Use of a cane, walker or w/c? No  Grab bars in the bathroom? No  Shower chair or bench in shower? Yes  Elevated toilet seat or a handicapped toilet? Yes   TIMED UP AND GO:  Was the test performed? No . Telephonic Visit  Cognitive Function:    04/02/2022   11:04 AM  MMSE - Mini Mental State Exam  Orientation to time 5  Orientation to Place 5  Registration 3  Attention/ Calculation 5  Recall 3  Language- name 2 objects 2  Language- repeat 1  Language- follow 3 step command 3  Language- read & follow direction 1  Write a sentence 1  Copy design 1  Total score 30        03/27/2023    9:40 AM  6CIT Screen  What Year? 0 points  What month? 0 points  What time? 0 points  Count back from 20 0 points  Months in reverse 0 points  Repeat phrase 0 points  Total Score 0 points    Immunizations Immunization History  Administered Date(s) Administered   Fluad Quad(high Dose 65+) 09/29/2019, 08/21/2020, 09/25/2021, 08/19/2022   Influenza Whole 09/07/2008, 12/11/2009, 08/26/2011, 09/25/2012   Influenza, High Dose Seasonal PF 08/05/2016, 08/11/2017, 08/17/2018   Influenza,inj,Quad PF,6+ Mos 08/30/2013, 08/03/2014, 08/09/2015   PFIZER(Purple Top)SARS-COV-2 Vaccination 12/27/2019, 01/17/2020, 08/21/2020   Pneumococcal Conjugate-13 01/31/2014   Pneumococcal Polysaccharide-23 09/08/2007, 06/04/2016   Td 09/09/2001, 12/26/2011   Tdap 08/17/2018, 08/07/2022   Zoster Recombinat (Shingrix) 08/04/2018, 11/05/2018    TDAP status: Up to date  Flu Vaccine status: Up to date  Pneumococcal vaccine status: Up to date  Covid-19 vaccine status: Completed vaccines  Qualifies for Shingles Vaccine? Yes   Zostavax completed No   Shingrix Completed?: Yes  Screening Tests Health Maintenance  Topic Date Due   COVID-19 Vaccine (5 - 2023-24 season) 07/26/2022   INFLUENZA VACCINE   06/26/2023   Medicare Annual Wellness (AWV)  03/26/2024   DTaP/Tdap/Td (5 - Td or Tdap) 08/07/2032   Pneumonia Vaccine 44+ Years old  Completed   Zoster Vaccines- Shingrix  Completed   HPV VACCINES  Aged Out    Health Maintenance  Health Maintenance Due  Topic Date Due   COVID-19 Vaccine (5 - 2023-24 season) 07/26/2022    Colorectal cancer screening: No longer required.   Lung Cancer Screening: (Low Dose CT Chest recommended if Age 25-80 years, 30 pack-year currently smoking OR have quit w/in 15years.) does not qualify.   Lung Cancer Screening Referral: No  Additional Screening:  Hepatitis C Screening: does not qualify; Completed No  Vision Screening: Recommended annual ophthalmology exams for early detection of glaucoma and other disorders of the eye. Is the patient up to date with their annual eye exam?  Yes  Who is the provider or what is the name of the office in which the patient attends annual eye exams? [facility] If pt is not established with a provider, would they like to be referred to a provider to establish care? No .   Dental Screening: Recommended annual dental exams for proper oral hygiene  Community Resource Referral / Chronic Care Management: CRR required this visit?  No   CCM required this visit?  No      Plan:     I have personally reviewed and noted the following in the patient's chart:   Medical and social history Use of alcohol, tobacco or illicit drugs  Current medications and supplements including opioid prescriptions. Patient is not currently taking opioid prescriptions. Functional ability and status Nutritional status Physical activity Advanced directives List of other physicians Hospitalizations, surgeries, and ER visits in previous 12 months Vitals Screenings to include cognitive, depression, and falls Referrals and appointments  In addition, I have reviewed and discussed with patient certain preventive protocols, quality  metrics, and best practice recommendations. A written personalized care plan for preventive services as well as general preventive health recommendations were provided to patient.     Mickeal Needy, LPN   [id number]   Nurse Notes:  Normal cognitive status assessed by direct observation via telephone conversation by this Nurse Health Advisor. No abnormalities found.   Medical screening examination/treatment/procedure(s) were performed by  non-physician practitioner and as supervising physician I was immediately available for consultation/collaboration.  I agree with above. Jacinta Shoe, MD

## 2023-03-27 NOTE — Patient Instructions (Signed)
[name] , Thank you for taking time to come for your Medicare Wellness Visit. I appreciate your ongoing commitment to your health goals. Please review the following plan we discussed and let me know if I can assist you in the future.   These are the goals we discussed:  Goals   None     This is a list of the screening recommended for you and due dates:  Health Maintenance  Topic Date Due   COVID-19 Vaccine (5 - 2023-24 season) 07/26/2022   Flu Shot  06/26/2023   Medicare Annual Wellness Visit  03/26/2024   DTaP/Tdap/Td vaccine (5 - Td or Tdap) 08/07/2032   Pneumonia Vaccine  Completed   Zoster (Shingles) Vaccine  Completed   HPV Vaccine  Aged Out    Advanced directives: Yes; Please bring a copy of your health care power of attorney and living will to the office at your convenience.  Conditions/risks identified: Yes; History of Falls  Next appointment: Follow up in one year for your annual wellness visit.   Preventive Care 75 Years and Older, Male  Preventive care refers to lifestyle choices and visits with your health care provider that can promote health and wellness. What does preventive care include? A yearly physical exam. This is also called an annual well check. Dental exams once or twice a year. Routine eye exams. Ask your health care provider how often you should have your eyes checked. Personal lifestyle choices, including: Daily care of your teeth and gums. Regular physical activity. Eating a healthy diet. Avoiding tobacco and drug use. Limiting alcohol use. Practicing safe sex. Taking low doses of aspirin every day. Taking vitamin and mineral supplements as recommended by your health care provider. What happens during an annual well check? The services and screenings done by your health care provider during your annual well check will depend on your age, overall health, lifestyle risk factors, and family history of disease. Counseling  Your health care  provider may ask you questions about your: Alcohol use. Tobacco use. Drug use. Emotional well-being. Home and relationship well-being. Sexual activity. Eating habits. History of falls. Memory and ability to understand (cognition). Work and work Astronomer. Screening  You may have the following tests or measurements: Height, weight, and BMI. Blood pressure. Lipid and cholesterol levels. These may be checked every 5 years, or more frequently if you are over 30 years old. Skin check. Lung cancer screening. You may have this screening every year starting at age 24 if you have a 30-pack-year history of smoking and currently smoke or have quit within the past 15 years. Fecal occult blood test (FOBT) of the stool. You may have this test every year starting at age 25. Flexible sigmoidoscopy or colonoscopy. You may have a sigmoidoscopy every 5 years or a colonoscopy every 10 years starting at age 26. Prostate cancer screening. Recommendations will vary depending on your family history and other risks. Hepatitis C blood test. Hepatitis B blood test. Sexually transmitted disease (STD) testing. Diabetes screening. This is done by checking your blood sugar (glucose) after you have not eaten for a while (fasting). You may have this done every 1-3 years. Abdominal aortic aneurysm (AAA) screening. You may need this if you are a current or former smoker. Osteoporosis. You may be screened starting at age 29 if you are at high risk. Talk with your health care provider about your test results, treatment options, and if necessary, the need for more tests. Vaccines  Your health care  provider may recommend certain vaccines, such as: Influenza vaccine. This is recommended every year. Tetanus, diphtheria, and acellular pertussis (Tdap, Td) vaccine. You may need a Td booster every 10 years. Zoster vaccine. You may need this after age 56. Pneumococcal 13-valent conjugate (PCV13) vaccine. One dose is  recommended after age 63. Pneumococcal polysaccharide (PPSV23) vaccine. One dose is recommended after age 25. Talk to your health care provider about which screenings and vaccines you need and how often you need them. This information is not intended to replace advice given to you by your health care provider. Make sure you discuss any questions you have with your health care provider. Document Released: 12/08/2015 Document Revised: 07/31/2016 Document Reviewed: 09/12/2015 Elsevier Interactive Patient Education  2017 [employer].  Fall Prevention in the Home Falls can cause injuries. They can happen to people of all ages. There are many things you can do to make your home safe and to help prevent falls. What can I do on the outside of my home? Regularly fix the edges of walkways and driveways and fix any cracks. Remove anything that might make you trip as you walk through a door, such as a raised step or threshold. Trim any bushes or trees on the path to your home. Use bright outdoor lighting. Clear any walking paths of anything that might make someone trip, such as rocks or tools. Regularly check to see if handrails are loose or broken. Make sure that both sides of any steps have handrails. Any raised decks and porches should have guardrails on the edges. Have any leaves, snow, or ice cleared regularly. Use sand or salt on walking paths during winter. Clean up any spills in your garage right away. This includes oil or grease spills. What can I do in the bathroom? Use night lights. Install grab bars by the toilet and in the tub and shower. Do not use towel bars as grab bars. Use non-skid mats or decals in the tub or shower. If you need to sit down in the shower, use a plastic, non-slip stool. Keep the floor dry. Clean up any water that spills on the floor as soon as it happens. Remove soap buildup in the tub or shower regularly. Attach bath mats securely with double-sided non-slip rug  tape. Do not have throw rugs and other things on the floor that can make you trip. What can I do in the bedroom? Use night lights. Make sure that you have a light by your bed that is easy to reach. Do not use any sheets or blankets that are too big for your bed. They should not hang down onto the floor. Have a firm chair that has side arms. You can use this for support while you get dressed. Do not have throw rugs and other things on the floor that can make you trip. What can I do in the kitchen? Clean up any spills right away. Avoid walking on wet floors. Keep items that you use a lot in easy-to-reach places. If you need to reach something above you, use a strong step stool that has a grab bar. Keep electrical cords out of the way. Do not use floor polish or wax that makes floors slippery. If you must use wax, use non-skid floor wax. Do not have throw rugs and other things on the floor that can make you trip. What can I do with my stairs? Do not leave any items on the stairs. Make sure that there are handrails on both  sides of the stairs and use them. Fix handrails that are broken or loose. Make sure that handrails are as long as the stairways. Check any carpeting to make sure that it is firmly attached to the stairs. Fix any carpet that is loose or worn. Avoid having throw rugs at the top or bottom of the stairs. If you do have throw rugs, attach them to the floor with carpet tape. Make sure that you have a light switch at the top of the stairs and the bottom of the stairs. If you do not have them, ask someone to add them for you. What else can I do to help prevent falls? Wear shoes that: Do not have high heels. Have rubber bottoms. Are comfortable and fit you well. Are closed at the toe. Do not wear sandals. If you use a stepladder: Make sure that it is fully opened. Do not climb a closed stepladder. Make sure that both sides of the stepladder are locked into place. Ask someone to  hold it for you, if possible. Clearly mark and make sure that you can see: Any grab bars or handrails. First and last steps. Where the edge of each step is. Use tools that help you move around (mobility aids) if they are needed. These include: Canes. Walkers. Scooters. Crutches. Turn on the lights when you go into a dark area. Replace any light bulbs as soon as they burn out. Set up your furniture so you have a clear path. Avoid moving your furniture around. If any of your floors are uneven, fix them. If there are any pets around you, be aware of where they are. Review your medicines with your doctor. Some medicines can make you feel dizzy. This can increase your chance of falling. Ask your doctor what other things that you can do to help prevent falls. This information is not intended to replace advice given to you by your health care provider. Make sure you discuss any questions you have with your health care provider. Document Released: 09/07/2009 Document Revised: 04/18/2016 Document Reviewed: 12/16/2014 Elsevier Interactive Patient Education  2017 [employer].

## 2023-04-01 ENCOUNTER — Encounter: Payer: Self-pay | Admitting: Internal Medicine

## 2023-04-01 ENCOUNTER — Telehealth: Payer: Self-pay | Admitting: Internal Medicine

## 2023-04-01 ENCOUNTER — Ambulatory Visit (INDEPENDENT_AMBULATORY_CARE_PROVIDER_SITE_OTHER): Payer: PPO | Admitting: Internal Medicine

## 2023-04-01 VITALS — BP 108/70 | HR 70 | Temp 97.5°F | Ht 67.0 in | Wt 134.0 lb

## 2023-04-01 DIAGNOSIS — R059 Cough, unspecified: Secondary | ICD-10-CM | POA: Diagnosis not present

## 2023-04-01 DIAGNOSIS — I255 Ischemic cardiomyopathy: Secondary | ICD-10-CM | POA: Diagnosis not present

## 2023-04-01 DIAGNOSIS — I4892 Unspecified atrial flutter: Secondary | ICD-10-CM

## 2023-04-01 DIAGNOSIS — I10 Essential (primary) hypertension: Secondary | ICD-10-CM

## 2023-04-01 DIAGNOSIS — I2510 Atherosclerotic heart disease of native coronary artery without angina pectoris: Secondary | ICD-10-CM | POA: Diagnosis not present

## 2023-04-01 DIAGNOSIS — I5042 Chronic combined systolic (congestive) and diastolic (congestive) heart failure: Secondary | ICD-10-CM | POA: Diagnosis not present

## 2023-04-01 DIAGNOSIS — S060X0D Concussion without loss of consciousness, subsequent encounter: Secondary | ICD-10-CM

## 2023-04-01 DIAGNOSIS — E538 Deficiency of other specified B group vitamins: Secondary | ICD-10-CM | POA: Diagnosis not present

## 2023-04-01 DIAGNOSIS — R29898 Other symptoms and signs involving the musculoskeletal system: Secondary | ICD-10-CM | POA: Diagnosis not present

## 2023-04-01 LAB — CBC WITH DIFFERENTIAL/PLATELET
Basophils Absolute: 0 10*3/uL (ref 0.0–0.1)
Basophils Relative: 0.2 % (ref 0.0–3.0)
Eosinophils Absolute: 0.4 10*3/uL (ref 0.0–0.7)
Eosinophils Relative: 4.6 % (ref 0.0–5.0)
HCT: 34.4 % — ABNORMAL LOW (ref 39.0–52.0)
Hemoglobin: 11.4 g/dL — ABNORMAL LOW (ref 13.0–17.0)
Lymphocytes Relative: 26.2 % (ref 12.0–46.0)
Lymphs Abs: 2 10*3/uL (ref 0.7–4.0)
MCHC: 33.1 g/dL (ref 30.0–36.0)
MCV: 93.6 fl (ref 78.0–100.0)
Monocytes Absolute: 0.5 10*3/uL (ref 0.1–1.0)
Monocytes Relative: 7 % (ref 3.0–12.0)
Neutro Abs: 4.7 10*3/uL (ref 1.4–7.7)
Neutrophils Relative %: 62 % (ref 43.0–77.0)
Platelets: 175 10*3/uL (ref 150.0–400.0)
RBC: 3.67 Mil/uL — ABNORMAL LOW (ref 4.22–5.81)
RDW: 13.7 % (ref 11.5–15.5)
WBC: 7.6 10*3/uL (ref 4.0–10.5)

## 2023-04-01 LAB — TSH: TSH: 2.05 u[IU]/mL (ref 0.35–5.50)

## 2023-04-01 LAB — COMPREHENSIVE METABOLIC PANEL
ALT: 10 U/L (ref 0–53)
AST: 19 U/L (ref 0–37)
Albumin: 3.7 g/dL (ref 3.5–5.2)
Alkaline Phosphatase: 104 U/L (ref 39–117)
BUN: 23 mg/dL (ref 6–23)
CO2: 27 mEq/L (ref 19–32)
Calcium: 11 mg/dL — ABNORMAL HIGH (ref 8.4–10.5)
Chloride: 100 mEq/L (ref 96–112)
Creatinine, Ser: 1.22 mg/dL (ref 0.40–1.50)
GFR: 52.95 mL/min — ABNORMAL LOW (ref >60.00)
Glucose, Bld: 95 mg/dL (ref 70–99)
Potassium: 4.4 mEq/L (ref 3.5–5.1)
Sodium: 132 mEq/L — ABNORMAL LOW (ref 135–145)
Total Bilirubin: 0.3 mg/dL (ref 0.2–1.2)
Total Protein: 7.8 g/dL (ref 6.0–8.3)

## 2023-04-01 LAB — VITAMIN B12: Vitamin B-12: 660 pg/mL (ref 211–911)

## 2023-04-01 MED ORDER — VITAMIN B-12 1000 MCG PO TABS: 1000.0000 ug | ORAL_TABLET | ORAL | 3 refills | Status: AC | PRN

## 2023-04-01 MED ORDER — HYDROCODONE BIT-HOMATROP MBR 5-1.5 MG/5ML PO SOLN: 5.0000 mL | Freq: Three times a day (TID) | ORAL | 0 refills | Status: DC | PRN

## 2023-04-01 NOTE — Telephone Encounter (Signed)
Pt wife want Dr. Macario Golds to know that she is concern about pt sleeping a lot during the day and he is more consume more about his health.

## 2023-04-01 NOTE — Assessment & Plan Note (Signed)
NAS diet 

## 2023-04-01 NOTE — Assessment & Plan Note (Signed)
Hycodan prn - rare use  Potential benefits of a long term opioids use as well as potential risks (i.e. addiction risk, apnea etc) and complications (i.e. Somnolence, constipation and others) were explained to the patient and were aknowledged.

## 2023-04-01 NOTE — Assessment & Plan Note (Signed)
Eliquis 

## 2023-04-01 NOTE — Assessment & Plan Note (Signed)
Better On Valsartan 40 mg qd  Toprol 1/2 bid (25 mg) Compression socks Hydrate well

## 2023-04-01 NOTE — Assessment & Plan Note (Signed)
On B12 qod

## 2023-04-01 NOTE — Telephone Encounter (Signed)
Pt is coming in this morning at 11:20 to see MD

## 2023-04-01 NOTE — Assessment & Plan Note (Signed)
Back to normal now 

## 2023-04-01 NOTE — Assessment & Plan Note (Signed)
Chronic Using a walker

## 2023-04-01 NOTE — Assessment & Plan Note (Signed)
Cont on Atenolol, ASA, Plavix, Lipitor, Losartan

## 2023-04-01 NOTE — Progress Notes (Signed)
Subjective:  Patient ID: [name] Christensen, male    DOB: 03/11/1932  Age: 89 y.o. MRN: [id number]  CC: No chief complaint on file.   HPI [name] Christensen presents for CAD, CHF, dyslipidemia Pt went to [place] for a heart test - ?ECHO - it was good   Outpatient Medications Prior to Visit  Medication Sig Dispense Refill   ascorbic acid (VITAMIN C) 250 MG tablet Take 250 mg by mouth daily.     guaiFENesin (MUCINEX) 600 MG 12 hr tablet Take 600 mg by mouth as needed for cough.     acetaminophen (TYLENOL) 500 MG tablet Take 500 mg by mouth at bedtime.     alfuzosin (UROXATRAL) 10 MG 24 hr tablet Take 10 mg by mouth every evening.      atorvastatin (LIPITOR) 40 MG tablet Take 20 mg by mouth daily.     betamethasone dipropionate 0.05 % cream Apply 1 application. topically 2 (two) times daily as needed.     cinacalcet (SENSIPAR) 30 MG tablet Take 1 tablet (30 mg total) by mouth 3 (three) times a week. 25 tablet 0   Dextromethorphan-guaiFENesin (MUCINEX DM PO) Take 30 mg by mouth in the morning and at bedtime.     dofetilide (TIKOSYN) 250 MCG capsule TAKE 1 CAPSULE BY MOUTH TWICE A DAY 180 capsule 1   donepezil (ARICEPT) 5 MG tablet TAKE ONE TABLET BY MOUTH EVERY NIGHT AT BEDTIME 90 tablet 3   dutasteride (AVODART) 0.5 MG capsule Take 0.5 mg by mouth every evening.      ELIQUIS 5 MG TABS tablet TAKE 1 TABLET BY MOUTH TWICE (2) DAILY (Patient taking differently: Take 5 mg by mouth 2 (two) times daily.) 180 tablet 3   escitalopram (LEXAPRO) 10 MG tablet Take 1 tablet (10 mg total) by mouth at bedtime. 90 tablet 1   famotidine (PEPCID) 20 MG tablet Take 20 mg by mouth daily.     iron polysaccharides (NIFEREX) 150 MG capsule Take 1 capsule (150 mg total) by mouth daily. 30 capsule 2   metoprolol succinate (TOPROL-XL) 50 MG 24 hr tablet Take 0.5 tablets (25 mg total) by mouth 2 (two) times daily. 180 tablet 1   Multiple Vitamin (MULTIVITAMIN WITH MINERALS) TABS tablet Take 1 tablet by mouth every other day.      nitroGLYCERIN (NITROSTAT) 0.4 MG SL tablet DISSOLVE 1 TABLET UNDER TONGUE AS NEEDEDFOR CHEST PAIN. MAY REPEAT 5 MINUTES APART 3 TIMES IF NEEDED 25 tablet 3   omeprazole (PRILOSEC) 20 MG capsule Take 20 mg by mouth daily.     Plecanatide 3 MG TABS Take 3 mg by mouth daily. Replaces (Linaclotide) Linzess     Polyethyl Glycol-Propyl Glycol (SYSTANE OP) Place 1 drop into both eyes daily as needed (burning eyes).      triamcinolone cream (KENALOG) 0.1 % APPLY 1 APPLICATION TOPICALLY TWO TIMES DAILY AS NEEDED AVOIDING FACE, GROIN ANDARMPITS (Patient taking differently: Apply 1 Application topically 2 (two) times daily.) 80 g 0   valsartan (DIOVAN) 40 MG tablet Take 1 tablet (40 mg total) by mouth daily. 90 tablet 3   vitamin B-12 (CYANOCOBALAMIN) 1000 MCG tablet Take 1,000 mcg by mouth daily.     No facility-administered medications prior to visit.    ROS: Review of Systems  Constitutional:  Positive for fatigue. Negative for appetite change and unexpected weight change.  HENT:  Positive for congestion. Negative for nosebleeds, sneezing, sore throat and trouble swallowing.   Eyes:  Negative for itching and visual  disturbance.  Respiratory:  Positive for cough.   Cardiovascular:  Negative for chest pain, palpitations and leg swelling.  Gastrointestinal:  Negative for abdominal distention, blood in stool, diarrhea and nausea.  Genitourinary:  Negative for frequency and hematuria.  Musculoskeletal:  Negative for back pain, gait problem, joint swelling and neck pain.  Skin:  Negative for rash.  Neurological:  Positive for weakness. Negative for dizziness, tremors and speech difficulty.  Psychiatric/Behavioral:  Positive for decreased concentration. Negative for agitation, dysphoric mood, sleep disturbance and suicidal ideas. The patient is not nervous/anxious.     Objective:  BP 108/70 (BP Location: Left Arm, Patient Position: Sitting, Cuff Size: Normal)   Pulse 70   Temp (!) 97.5 F (36.4 C)  (Oral)   Ht 5\' 7"  (1.702 m)   Wt 134 lb (60.8 kg)   SpO2 95%   BMI 20.99 kg/m   BP Readings from Last 3 Encounters:  04/01/23 108/70  02/20/23 120/70  01/20/23 130/62    Wt Readings from Last 3 Encounters:  04/01/23 134 lb (60.8 kg)  03/27/23 130 lb (59 kg)  02/20/23 130 lb (59 kg)    Physical Exam Constitutional:      General: He is not in acute distress.    Appearance: He is well-developed.     Comments: NAD  Eyes:     Conjunctiva/sclera: Conjunctivae normal.     Pupils: Pupils are equal, round, and reactive to light.  Neck:     Thyroid: No thyromegaly.     Vascular: No JVD.  Cardiovascular:     Rate and Rhythm: Normal rate and regular rhythm.     Heart sounds: Normal heart sounds. No murmur heard.    No friction rub. No gallop.  Pulmonary:     Effort: Pulmonary effort is normal. No respiratory distress.     Breath sounds: Normal breath sounds. No wheezing or rales.  Chest:     Chest wall: No tenderness.  Abdominal:     General: Bowel sounds are normal. There is no distension.     Palpations: Abdomen is soft. There is no mass.     Tenderness: There is no abdominal tenderness. There is no guarding or rebound.  Musculoskeletal:        General: No tenderness. Normal range of motion.     Cervical back: Normal range of motion.  Lymphadenopathy:     Cervical: No cervical adenopathy.  Skin:    General: Skin is warm and dry.     Findings: No rash.  Neurological:     Mental Status: He is alert and oriented to person, place, and time.     Cranial Nerves: No cranial nerve deficit.     Motor: No abnormal muscle tone.     Coordination: Coordination normal.     Gait: Gait normal.     Deep Tendon Reflexes: Reflexes are normal and symmetric.  Psychiatric:        Behavior: Behavior normal.        Thought Content: Thought content normal.        Judgment: Judgment normal.   Using a walker    A total time of 45 minutes was spent preparing to see the patient, reviewing  tests, x-rays, operative reports and other medical records.  Also, obtaining history and performing comprehensive physical exam.  Additionally, counseling the patient regarding the above listed issues.   Finally, documenting clinical information in the health records, coordination of care, educating the patient. It is a complex case.  Lab Results  Component Value Date   WBC 7.1 02/20/2023   HGB 11.7 (L) 02/20/2023   HCT 35.0 (L) 02/20/2023   PLT 165.0 02/20/2023   GLUCOSE 95 02/20/2023   CHOL 93 09/30/2022   TRIG 113.0 09/30/2022   HDL 44.80 09/30/2022   LDLCALC 26 09/30/2022   ALT 10 02/20/2023   AST 19 02/20/2023   NA 132 (L) 02/20/2023   K 4.5 02/20/2023   CL 98 02/20/2023   CREATININE 1.09 02/20/2023   BUN 24 (H) 02/20/2023   CO2 30 02/20/2023   TSH 2.52 02/20/2023   PSA 12.45 (H) 02/06/2016   INR 1.1 04/02/2017   HGBA1C 6.1 12/30/2019    DG Chest 2 View  Result Date: 01/12/2023 CLINICAL DATA:  Fall. Concern for recurrent right pneumothorax. Patient reports no difficulty breathing at this time. EXAM: CHEST - 2 VIEW COMPARISON:  Chest radiograph 12/31/2022, 12/30/2022, 12/29/2022; CT chest 11/20/2022 FINDINGS: The heart size and mediastinal contours are within normal limits. Left chest wall AICD with leads overlying the right atrium and right ventricle. Postoperative changes of median sternotomy and coronary artery bypass graft. Aortic calcifications. No pneumothorax. Chronic basilar interstitial thickening and hyperinflation. No new focal consolidation no pleural effusion. Multilevel degenerative changes in the thoracic spine. Surgical clips overlie the right upper abdomen no acute osseous abnormality. Old fracture of the anterior right fourth rib. IMPRESSION: No acute cardiopulmonary abnormality. Specifically, no pneumothorax as queried. Electronically Signed   By: Sherron Ales M.D.   On: 01/12/2023 16:45   CT Head Wo Contrast  Result Date: 01/12/2023 CLINICAL DATA:  Fall  EXAM: CT HEAD WITHOUT CONTRAST CT CERVICAL SPINE WITHOUT CONTRAST TECHNIQUE: Multidetector CT imaging of the head and cervical spine was performed following the standard protocol without intravenous contrast. Multiplanar CT image reconstructions of the cervical spine were also generated. RADIATION DOSE REDUCTION: This exam was performed according to the departmental dose-optimization program which includes automated exposure control, adjustment of the mA and/or kV according to patient size and/or use of iterative reconstruction technique. COMPARISON:  12/26/2022 FINDINGS: CT HEAD FINDINGS Brain: No evidence of acute infarction, hemorrhage, hydrocephalus, extra-axial collection or mass lesion/mass effect. Periventricular and deep white matter hypodensity. Vascular: No hyperdense vessel or unexpected calcification. Skull: Normal. Negative for fracture or focal lesion. Sinuses/Orbits: No acute finding. Other: Soft tissue hematoma of the right forehead (series 2, image 41) CT CERVICAL SPINE FINDINGS Alignment: Normal. Skull base and vertebrae: No acute fracture. No primary bone lesion or focal pathologic process. Soft tissues and spinal canal: No prevertebral fluid or swelling. No visible canal hematoma. Disc levels: Moderate disc space height loss and osteophytosis of C3-C5 with otherwise mild disc degenerative change. Upper chest: Negative. Other: None. IMPRESSION: 1. No acute intracranial pathology. Small-vessel white matter disease. 2. Soft tissue hematoma of the right forehead. 3. No fracture or static subluxation of the cervical spine. Electronically Signed   By: Jearld Lesch M.D.   On: 01/12/2023 14:33   CT Cervical Spine Wo Contrast  Result Date: 01/12/2023 CLINICAL DATA:  Fall EXAM: CT HEAD WITHOUT CONTRAST CT CERVICAL SPINE WITHOUT CONTRAST TECHNIQUE: Multidetector CT imaging of the head and cervical spine was performed following the standard protocol without intravenous contrast. Multiplanar CT image  reconstructions of the cervical spine were also generated. RADIATION DOSE REDUCTION: This exam was performed according to the departmental dose-optimization program which includes automated exposure control, adjustment of the mA and/or kV according to patient size and/or use of iterative reconstruction technique. COMPARISON:  12/26/2022 FINDINGS:  CT HEAD FINDINGS Brain: No evidence of acute infarction, hemorrhage, hydrocephalus, extra-axial collection or mass lesion/mass effect. Periventricular and deep white matter hypodensity. Vascular: No hyperdense vessel or unexpected calcification. Skull: Normal. Negative for fracture or focal lesion. Sinuses/Orbits: No acute finding. Other: Soft tissue hematoma of the right forehead (series 2, image 41) CT CERVICAL SPINE FINDINGS Alignment: Normal. Skull base and vertebrae: No acute fracture. No primary bone lesion or focal pathologic process. Soft tissues and spinal canal: No prevertebral fluid or swelling. No visible canal hematoma. Disc levels: Moderate disc space height loss and osteophytosis of C3-C5 with otherwise mild disc degenerative change. Upper chest: Negative. Other: None. IMPRESSION: 1. No acute intracranial pathology. Small-vessel white matter disease. 2. Soft tissue hematoma of the right forehead. 3. No fracture or static subluxation of the cervical spine. Electronically Signed   By: Jearld Lesch M.D.   On: 01/12/2023 14:33    Assessment & Plan:   Problem List Items Addressed This Visit     Essential hypertension    Better On Valsartan 40 mg qd  Toprol 1/2 bid (25 mg) Compression socks Hydrate well      CAD (coronary artery disease)    Cont on Atenolol, ASA, Plavix, Lipitor, Losartan      Cough in adult    Hycodan prn - rare use  Potential benefits of a long term opioids use as well as potential risks (i.e. addiction risk, apnea etc) and complications (i.e. Somnolence, constipation and others) were explained to the patient and were  aknowledged.       Atrial flutter (HCC)    Eliquis      Ischemic cardiomyopathy    NAS diet      Relevant Orders   CBC with Differential/Platelet   Comprehensive metabolic panel   TSH   Systolic and diastolic CHF, chronic (HCC)    NAS diet      Vitamin B12 deficiency (non anemic)    On B12 qod      Relevant Orders   Vitamin B12   Concussion    Back to normal now      Weakness of both legs - Primary    Chronic Using a walker      Relevant Orders   CBC with Differential/Platelet   Comprehensive metabolic panel   TSH   Vitamin B12      Meds ordered this encounter  Medications   cyanocobalamin (VITAMIN B12) 1000 MCG tablet    Sig: Take 1 tablet (1,000 mcg total) by mouth every other day as needed.    Dispense:  100 tablet    Refill:  3   HYDROcodone bit-homatropine (HYCODAN) 5-1.5 MG/5ML syrup    Sig: Take 5 mLs by mouth every 8 (eight) hours as needed for cough.    Dispense:  240 mL    Refill:  0      Follow-up: Return in about 2 months (around 06/01/2023) for a follow-up visit.  [name], MD

## 2023-04-02 ENCOUNTER — Ambulatory Visit (INDEPENDENT_AMBULATORY_CARE_PROVIDER_SITE_OTHER): Payer: PPO

## 2023-04-02 DIAGNOSIS — I255 Ischemic cardiomyopathy: Secondary | ICD-10-CM | POA: Diagnosis not present

## 2023-04-02 LAB — CUP PACEART REMOTE DEVICE CHECK
Battery Remaining Longevity: 26 mo
Battery Remaining Percentage: 32 %
Battery Voltage: 2.87 V
Brady Statistic AP VP Percent: 3.6 %
Brady Statistic AP VS Percent: 83 %
Brady Statistic AS VP Percent: 1 %
Brady Statistic AS VS Percent: 13 %
Brady Statistic RA Percent Paced: 83 %
Brady Statistic RV Percent Paced: 3.7 %
Date Time Interrogation Session: 20240508020016
HighPow Impedance: 73 Ohm
HighPow Impedance: 73 Ohm
Implantable Lead Connection Status: 753985
Implantable Lead Connection Status: 753985
Implantable Lead Implant Date: 20131218
Implantable Lead Implant Date: 20180514
Implantable Lead Location: 753859
Implantable Lead Location: 753860
Implantable Lead Model: 5076
Implantable Pulse Generator Implant Date: 20180514
Lead Channel Impedance Value: 400 Ohm
Lead Channel Impedance Value: 530 Ohm
Lead Channel Pacing Threshold Amplitude: 0.5 V
Lead Channel Pacing Threshold Amplitude: 1.25 V
Lead Channel Pacing Threshold Pulse Width: 0.5 ms
Lead Channel Pacing Threshold Pulse Width: 0.5 ms
Lead Channel Sensing Intrinsic Amplitude: 2.7 mV
Lead Channel Sensing Intrinsic Amplitude: 5.3 mV
Lead Channel Setting Pacing Amplitude: 2 V
Lead Channel Setting Pacing Amplitude: 2.5 V
Lead Channel Setting Pacing Pulse Width: 0.5 ms
Lead Channel Setting Sensing Sensitivity: 0.5 mV
Pulse Gen Serial Number: 1245762

## 2023-04-03 ENCOUNTER — Telehealth: Payer: Self-pay | Admitting: Internal Medicine

## 2023-04-03 DIAGNOSIS — R059 Cough, unspecified: Secondary | ICD-10-CM

## 2023-04-03 NOTE — Telephone Encounter (Signed)
Patient requested a referral for a new pulmonologist. He would like the referral to go to [facility]. The office number is [phone]. Best callback for patient is [phone].

## 2023-04-07 NOTE — Telephone Encounter (Signed)
Okay. Thank you.

## 2023-04-09 DIAGNOSIS — J471 Bronchiectasis with (acute) exacerbation: Secondary | ICD-10-CM | POA: Diagnosis not present

## 2023-04-09 DIAGNOSIS — R053 Chronic cough: Secondary | ICD-10-CM | POA: Diagnosis not present

## 2023-04-10 DIAGNOSIS — Z961 Presence of intraocular lens: Secondary | ICD-10-CM | POA: Diagnosis not present

## 2023-04-10 DIAGNOSIS — H43813 Vitreous degeneration, bilateral: Secondary | ICD-10-CM | POA: Diagnosis not present

## 2023-04-10 DIAGNOSIS — M3501 Sicca syndrome with keratoconjunctivitis: Secondary | ICD-10-CM | POA: Diagnosis not present

## 2023-04-23 NOTE — Progress Notes (Signed)
Remote ICD transmission.   

## 2023-04-29 DIAGNOSIS — R053 Chronic cough: Secondary | ICD-10-CM | POA: Diagnosis not present

## 2023-05-05 DIAGNOSIS — J479 Bronchiectasis, uncomplicated: Secondary | ICD-10-CM | POA: Diagnosis not present

## 2023-05-05 DIAGNOSIS — R053 Chronic cough: Secondary | ICD-10-CM | POA: Diagnosis not present

## 2023-05-05 DIAGNOSIS — J471 Bronchiectasis with (acute) exacerbation: Secondary | ICD-10-CM | POA: Diagnosis not present

## 2023-05-06 ENCOUNTER — Encounter: Payer: Self-pay | Admitting: Internal Medicine

## 2023-05-06 ENCOUNTER — Ambulatory Visit: Payer: PPO | Attending: Internal Medicine | Admitting: Internal Medicine

## 2023-05-06 VITALS — BP 118/66 | HR 70 | Ht 67.0 in | Wt 135.8 lb

## 2023-05-06 DIAGNOSIS — I4892 Unspecified atrial flutter: Secondary | ICD-10-CM

## 2023-05-06 DIAGNOSIS — I493 Ventricular premature depolarization: Secondary | ICD-10-CM

## 2023-05-06 DIAGNOSIS — I4819 Other persistent atrial fibrillation: Secondary | ICD-10-CM | POA: Diagnosis not present

## 2023-05-06 DIAGNOSIS — I495 Sick sinus syndrome: Secondary | ICD-10-CM | POA: Diagnosis not present

## 2023-05-06 DIAGNOSIS — I255 Ischemic cardiomyopathy: Secondary | ICD-10-CM

## 2023-05-06 LAB — CUP PACEART INCLINIC DEVICE CHECK
Battery Remaining Longevity: 27 mo
Brady Statistic RA Percent Paced: 84 %
Brady Statistic RV Percent Paced: 4.1 %
Date Time Interrogation Session: 20240611133659
HighPow Impedance: 72 Ohm
Implantable Lead Connection Status: 753985
Implantable Lead Connection Status: 753985
Implantable Lead Implant Date: 20131218
Implantable Lead Implant Date: 20180514
Implantable Lead Location: 753859
Implantable Lead Location: 753860
Implantable Lead Model: 5076
Implantable Pulse Generator Implant Date: 20180514
Lead Channel Impedance Value: 375 Ohm
Lead Channel Impedance Value: 512.5 Ohm
Lead Channel Pacing Threshold Amplitude: 0.5 V
Lead Channel Pacing Threshold Amplitude: 0.5 V
Lead Channel Pacing Threshold Amplitude: 1.25 V
Lead Channel Pacing Threshold Amplitude: 1.25 V
Lead Channel Pacing Threshold Pulse Width: 0.5 ms
Lead Channel Pacing Threshold Pulse Width: 0.5 ms
Lead Channel Pacing Threshold Pulse Width: 0.5 ms
Lead Channel Pacing Threshold Pulse Width: 0.5 ms
Lead Channel Sensing Intrinsic Amplitude: 3.1 mV
Lead Channel Sensing Intrinsic Amplitude: 5.3 mV
Lead Channel Setting Pacing Amplitude: 2 V
Lead Channel Setting Pacing Amplitude: 2.5 V
Lead Channel Setting Pacing Pulse Width: 0.5 ms
Lead Channel Setting Sensing Sensitivity: 0.5 mV
Pulse Gen Serial Number: 1245762

## 2023-05-06 MED ORDER — METOPROLOL SUCCINATE ER 50 MG PO TB24: 25.0000 mg | ORAL_TABLET | Freq: Every evening | ORAL | 1 refills | Status: DC | PRN

## 2023-05-06 MED ORDER — APIXABAN 2.5 MG PO TABS: 2.5000 mg | ORAL_TABLET | Freq: Two times a day (BID) | ORAL | 3 refills | Status: AC

## 2023-05-06 NOTE — Patient Instructions (Addendum)
Medication Instructions:  Your physician has recommended you make the following change in your medication:   ** Begin taking Metoprolol Succinate at bedtime - no morning dose  ** Stop Eliquis 5mg   ** Begin Eliquis 2.5mg  - 1 tablet by mouth twice daily.  *If you need a refill on your cardiac medications before your next appointment, please call your pharmacy*   Lab Work: None ordered.  If you have labs (blood work) drawn today and your tests are completely normal, you will receive your results only by: MyChart Message (if you have MyChart) OR A paper copy in the mail If you have any lab test that is abnormal or we need to change your treatment, we will call you to review the results.   Testing/Procedures: None ordered.    Follow-Up: At [facility], you and your health needs are our priority.  As part of our continuing mission to provide you with exceptional heart care, we have created designated Provider Care Teams.  These Care Teams include your primary Cardiologist (physician) and Advanced Practice Providers (APPs -  Physician Assistants and Nurse Practitioners) who all work together to provide you with the care you need, when you need it.  We recommend signing up for the patient portal called "MyChart".  Sign up information is provided on this After Visit Summary.  MyChart is used to connect with patients for Virtual Visits (Telemedicine).  Patients are able to view lab/test results, encounter notes, upcoming appointments, etc.  Non-urgent messages can be sent to your provider as well.   To learn more about what you can do with MyChart, go to [email or web address].    Your next appointment:   6 months with Dr Graciela Husbands

## 2023-05-06 NOTE — Progress Notes (Signed)
Patient Care Team: Plotnikov, Georgina Quint, MD as PCP - General (Internal Medicine) Wendall Stade, MD as PCP - Cardiology (Cardiology) Duke Salvia, MD as PCP - Electrophysiology (Cardiology) Lindell Noe Lehman Prom., MD (Urology) [name], MD as Consulting Physician (General Surgery) Pyrtle, Carie Caddy, MD as Consulting Physician (Gastroenterology) [name], MD as Referring Physician (Ophthalmology) Nyoka Cowden, MD as Consulting Physician (Pulmonary Disease) Romero Belling, MD (Inactive) as Consulting Physician (Endocrinology)   HPI  [name] Christensen is a 89 y.o. male   seen in follow-up for ischemic cardiomyopathy with progressive left ventricular dysfunction in the context of frequent PVCs and chronic congestive heart failure.  He has persistent atrial fibrillation for which he was started on dofetilide 8/20  ( as well as PVCs) and anticoagulation with Apixaban    Have been considering AFib, Pacing and PVCs as cause of his symptoms and LV dysfunction>> reprogramming of his device eliminated pacing ( RBBB), dofetilide >>sinus, so the PVCs remain unaddressed with failed Anti-arrhythmic drugs suppression.  Most recently tried to increase rate for overdrive suppression, with some concern about encroaching on AV conduction and triggering RV apical pacing   Seen recently by Dr. Theadora Rama because of cough change to [facility]; is unclear whether the patient is taking both drugs now or not.   A couplw  oaf interval falls prompting a reduction of his medications.  They sound orthostatic.  No arrhythmias noted on his device.  Still struggling with lassitude, sometimes not interested in getting out of bed.  If he has something to do he is much better.  Complains of fatigue.  Has does not go to the gym,  DATE TEST PVC  1/15 Holter 13%  1/18 Holter 31%  2/20 ZIO 7.2% ( all one morphology)   9/20 Pacer 12%  11/20 Pacer 11%  2/21 Pacer 9.4%  9/21  13%  5/23 Pacer  15% (est from AV interval plot)          DATE TEST EF    12/13 Cath   Patent Grafts  2/15 Echo   45-50 %    12/17 Echo   50-55 %    4/19 Echo 20-25%    10/19 Echo  25-30%    1/20 Echo  20-25%    6/20 LHC 20-25% LIMA-LADatretic SVG-OM,SVG-D with filling of LAD,SVG-OM patent  11/20 Echo  25-30%   4/22 Echo  40-45%   9/22 Echo   40-45%   5/24 Echo (VA) 50%     Date Cr K Mg Hgb  6/20 1.14 4.9 2.0(3/20) 12.5   8/20 1.03 4.2 1.8 12.8  2/21 1.06 4.0 2.1 12.7  7/21 1.19 4.8    9/21 1.14 5.0 2.1 11.3  4/22 0.97 4.0  12.8  9/22 0.97 4.3  10.8  1/23     11.7  5/23 1.2 4.1    11/23 1.05 4.3  10.8  5/24 1.22 4.4 2.1 (2/24) 11.4   Antiarrhythmics Date Reason stopped  amio 7/19    dofetilide          Records and Results Reviewed *  Past Medical History:  Diagnosis Date   Actinic keratosis 06/09/2019   L sup forehead at hairline - bx proven   AICD (automatic cardioverter/defibrillator) present 04/07/2017   Allergy    Anxiety    BPH (benign prostatic hypertrophy)    elevated PSA Dr. Lindell Noe Bx 2010   CAP (community acquired pneumonia) 11/16/2012   rx levaquin 12/14/12  -  Patient Care Team: Plotnikov, Georgina Quint, MD as PCP - General (Internal Medicine) Wendall Stade, MD as PCP - Cardiology (Cardiology) Duke Salvia, MD as PCP - Electrophysiology (Cardiology) Lindell Noe Lehman Prom., MD (Urology) [name], MD as Consulting Physician (General Surgery) Pyrtle, Carie Caddy, MD as Consulting Physician (Gastroenterology) [name], MD as Referring Physician (Ophthalmology) Nyoka Cowden, MD as Consulting Physician (Pulmonary Disease) Romero Belling, MD (Inactive) as Consulting Physician (Endocrinology)   HPI  [name] Christensen is a 89 y.o. male   seen in follow-up for ischemic cardiomyopathy with progressive left ventricular dysfunction in the context of frequent PVCs and chronic congestive heart failure.  He has persistent atrial fibrillation for which he was started on dofetilide 8/20  ( as well as PVCs) and anticoagulation with Apixaban    Have been considering AFib, Pacing and PVCs as cause of his symptoms and LV dysfunction>> reprogramming of his device eliminated pacing ( RBBB), dofetilide >>sinus, so the PVCs remain unaddressed with failed Anti-arrhythmic drugs suppression.  Most recently tried to increase rate for overdrive suppression, with some concern about encroaching on AV conduction and triggering RV apical pacing   Seen recently by Dr. Theadora Rama because of cough change to [facility]; is unclear whether the patient is taking both drugs now or not.   A couplw  oaf interval falls prompting a reduction of his medications.  They sound orthostatic.  No arrhythmias noted on his device.  Still struggling with lassitude, sometimes not interested in getting out of bed.  If he has something to do he is much better.  Complains of fatigue.  Has does not go to the gym,  DATE TEST PVC  1/15 Holter 13%  1/18 Holter 31%  2/20 ZIO 7.2% ( all one morphology)   9/20 Pacer 12%  11/20 Pacer 11%  2/21 Pacer 9.4%  9/21  13%  5/23 Pacer  15% (est from AV interval plot)          DATE TEST EF    12/13 Cath   Patent Grafts  2/15 Echo   45-50 %    12/17 Echo   50-55 %    4/19 Echo 20-25%    10/19 Echo  25-30%    1/20 Echo  20-25%    6/20 LHC 20-25% LIMA-LADatretic SVG-OM,SVG-D with filling of LAD,SVG-OM patent  11/20 Echo  25-30%   4/22 Echo  40-45%   9/22 Echo   40-45%   5/24 Echo (VA) 50%     Date Cr K Mg Hgb  6/20 1.14 4.9 2.0(3/20) 12.5   8/20 1.03 4.2 1.8 12.8  2/21 1.06 4.0 2.1 12.7  7/21 1.19 4.8    9/21 1.14 5.0 2.1 11.3  4/22 0.97 4.0  12.8  9/22 0.97 4.3  10.8  1/23     11.7  5/23 1.2 4.1    11/23 1.05 4.3  10.8  5/24 1.22 4.4 2.1 (2/24) 11.4   Antiarrhythmics Date Reason stopped  amio 7/19    dofetilide          Records and Results Reviewed *  Past Medical History:  Diagnosis Date   Actinic keratosis 06/09/2019   L sup forehead at hairline - bx proven   AICD (automatic cardioverter/defibrillator) present 04/07/2017   Allergy    Anxiety    BPH (benign prostatic hypertrophy)    elevated PSA Dr. Lindell Noe Bx 2010   CAP (community acquired pneumonia) 11/16/2012   rx levaquin 12/14/12  -  Patient Care Team: Plotnikov, Georgina Quint, MD as PCP - General (Internal Medicine) Wendall Stade, MD as PCP - Cardiology (Cardiology) Duke Salvia, MD as PCP - Electrophysiology (Cardiology) Lindell Noe Lehman Prom., MD (Urology) [name], MD as Consulting Physician (General Surgery) Pyrtle, Carie Caddy, MD as Consulting Physician (Gastroenterology) [name], MD as Referring Physician (Ophthalmology) Nyoka Cowden, MD as Consulting Physician (Pulmonary Disease) Romero Belling, MD (Inactive) as Consulting Physician (Endocrinology)   HPI  [name] Christensen is a 89 y.o. male   seen in follow-up for ischemic cardiomyopathy with progressive left ventricular dysfunction in the context of frequent PVCs and chronic congestive heart failure.  He has persistent atrial fibrillation for which he was started on dofetilide 8/20  ( as well as PVCs) and anticoagulation with Apixaban    Have been considering AFib, Pacing and PVCs as cause of his symptoms and LV dysfunction>> reprogramming of his device eliminated pacing ( RBBB), dofetilide >>sinus, so the PVCs remain unaddressed with failed Anti-arrhythmic drugs suppression.  Most recently tried to increase rate for overdrive suppression, with some concern about encroaching on AV conduction and triggering RV apical pacing   Seen recently by Dr. Theadora Rama because of cough change to [facility]; is unclear whether the patient is taking both drugs now or not.   A couplw  oaf interval falls prompting a reduction of his medications.  They sound orthostatic.  No arrhythmias noted on his device.  Still struggling with lassitude, sometimes not interested in getting out of bed.  If he has something to do he is much better.  Complains of fatigue.  Has does not go to the gym,  DATE TEST PVC  1/15 Holter 13%  1/18 Holter 31%  2/20 ZIO 7.2% ( all one morphology)   9/20 Pacer 12%  11/20 Pacer 11%  2/21 Pacer 9.4%  9/21  13%  5/23 Pacer  15% (est from AV interval plot)          DATE TEST EF    12/13 Cath   Patent Grafts  2/15 Echo   45-50 %    12/17 Echo   50-55 %    4/19 Echo 20-25%    10/19 Echo  25-30%    1/20 Echo  20-25%    6/20 LHC 20-25% LIMA-LADatretic SVG-OM,SVG-D with filling of LAD,SVG-OM patent  11/20 Echo  25-30%   4/22 Echo  40-45%   9/22 Echo   40-45%   5/24 Echo (VA) 50%     Date Cr K Mg Hgb  6/20 1.14 4.9 2.0(3/20) 12.5   8/20 1.03 4.2 1.8 12.8  2/21 1.06 4.0 2.1 12.7  7/21 1.19 4.8    9/21 1.14 5.0 2.1 11.3  4/22 0.97 4.0  12.8  9/22 0.97 4.3  10.8  1/23     11.7  5/23 1.2 4.1    11/23 1.05 4.3  10.8  5/24 1.22 4.4 2.1 (2/24) 11.4   Antiarrhythmics Date Reason stopped  amio 7/19    dofetilide          Records and Results Reviewed *  Past Medical History:  Diagnosis Date   Actinic keratosis 06/09/2019   L sup forehead at hairline - bx proven   AICD (automatic cardioverter/defibrillator) present 04/07/2017   Allergy    Anxiety    BPH (benign prostatic hypertrophy)    elevated PSA Dr. Lindell Noe Bx 2010   CAP (community acquired pneumonia) 11/16/2012   rx levaquin 12/14/12  -  esr 51 12/31/2012 and no eos  05/2021 likely a COVID complication.  Prescribed cefdinir antibiotic, cough syrup with codeine, Medrol Dosepak  1/23 CAP --sx's - 80+% better. CXR: COPD changes with chronic basilar interstitial disease and suspect  superimposed acute infiltrate at RIGHT base.   Cardiac arrest [facility])    a. out-of-hospital arrest 11/24/2012 - EF 40-45%, patent grafts on cath, received [employer] AICD.   Carotid disease, bilateral (HCC)    a. 0-39% by doppers.   Cataract    bil cataracts removed   Chest pain, atypical 07/21/2013   CHF (congestive heart failure) (HCC)    Chills with fever 11/24/2017   12/18 Recurrent - ?prostatitis  1/19 ?URI  6/20    Chronic calculous cholecystitis 06/05/2017   Chronic diastolic CHF (congestive heart failure) (HCC) 03/09/2018   10/19 Probable pacemaker  cardiomyopathy per Dr Graciela Husbands w/a plan to   1) INCREASE cozaar (losartan) 50 mg - take 1 tablet by mouth once daily - d/c. Losartan was stopped on 05/12/19 due to high K  2) START mexiletine 150 mg- take 1 tablet by mouth TWICE daily   Coronary artery disease    a. s/p MI/CABG 2005. b. s/p cath at time of VF arrest 10/2013 - grafts patent.   Cough    Diverticulosis    Elevated LFTs    a. 10/2012 felt due to cardiac arrest - Hepatitis C Ab reactive from 10/25/2012>>Hep C RNA PCR negative 11/13/2012.    GERD (gastroesophageal reflux disease)    Hyperlipidemia    Hypertension    Internal hemorrhoids    Myocardial infarction (HCC)    2005 - CABG x 5 2005   Paroxysmal atrial fibrillation (HCC)    Continue on Eliquis, Toprol, Tikosyn  Dr Graciela Husbands   RBBB    Tubular adenoma of colon    Unstable angina (HCC)    Upper airway cough syndrome 03/26/2022   Onset ? Jan 2023   - try off Entresto  03/25/2022   - max gerd rx 08/01/2022 >>>       Ventricular bigeminy    a. Event monitor 01/2013: NSR with PVCs and occ bigeminy.   Weakness 04/09/2016   5/17 ?post-viral vs other, 3/18, 6/20, 1/23  CFS  He is very active for his age  44/23  Post-concussion  Take Lexapro and Toprol qhs  Reduce Toprol to 1 qhs    Past Surgical History:  Procedure Laterality Date   APPENDECTOMY     CARDIAC CATHETERIZATION  2007   with patent graft anatomy atretic left internal mammary  artery to the LAD which is nonobstructive. Will restart study June 08, 2007   CARDIOVERSION N/A 03/27/2018   Procedure: CARDIOVERSION;  Surgeon: Iran Ouch, MD;  Location: [facility] ORS;  Service: Cardiovascular;  Laterality: N/A;   CARDIOVERSION N/A 01/04/2019   Procedure: CARDIOVERSION (CATH LAB);  Surgeon: Iran Ouch, MD;  Location: [facility] ORS;  Service: Cardiovascular;  Laterality: N/A;   CARDIOVERSION N/A 07/07/2019   Procedure: CARDIOVERSION;  Surgeon: Wendall Stade, MD;  Location: [facility] ENDOSCOPY;  Service: Cardiovascular;  Laterality: N/A;    CATARACT EXTRACTION W/PHACO Right 04/23/2016   Procedure: CATARACT EXTRACTION PHACO AND INTRAOCULAR LENS PLACEMENT (IOC);  Surgeon: [name], MD;  Location: [facility] ORS;  Service: Ophthalmology;  Laterality: Right;  [place] 1.09 AP% 19.3 CDE 13.38 Fluid pack lot # [id number]   CHOLECYSTECTOMY N/A 06/05/2017   Procedure: LAPAROSCOPIC CHOLECYSTECTOMY WITH INTRAOPERATIVE CHOLANGIOGRAM ERAS PATHWAY POSSIBLE NEEDLE CORE BIOPSY OF LIVER;  Surgeon: Michaell Cowing,  esr 51 12/31/2012 and no eos  05/2021 likely a COVID complication.  Prescribed cefdinir antibiotic, cough syrup with codeine, Medrol Dosepak  1/23 CAP --sx's - 80+% better. CXR: COPD changes with chronic basilar interstitial disease and suspect  superimposed acute infiltrate at RIGHT base.   Cardiac arrest [facility])    a. out-of-hospital arrest 11/24/2012 - EF 40-45%, patent grafts on cath, received [employer] Jude AICD.   Carotid disease, bilateral (HCC)    a. 0-39% by doppers.   Cataract    bil cataracts removed   Chest pain, atypical 07/21/2013   CHF (congestive heart failure) (HCC)    Chills with fever 11/24/2017   12/18 Recurrent - ?prostatitis  1/19 ?URI  6/20    Chronic calculous cholecystitis 06/05/2017   Chronic diastolic CHF (congestive heart failure) (HCC) 03/09/2018   10/19 Probable pacemaker  cardiomyopathy per Dr Graciela Husbands w/a plan to   1) INCREASE cozaar (losartan) 50 mg - take 1 tablet by mouth once daily - d/c. Losartan was stopped on 05/12/19 due to high K  2) START mexiletine 150 mg- take 1 tablet by mouth TWICE daily   Coronary artery disease    a. s/p MI/CABG 2005. b. s/p cath at time of VF arrest 10/2013 - grafts patent.   Cough    Diverticulosis    Elevated LFTs    a. 10/2012 felt due to cardiac arrest - Hepatitis C Ab reactive from 10/25/2012>>Hep C RNA PCR negative 11/13/2012.    GERD (gastroesophageal reflux disease)    Hyperlipidemia    Hypertension    Internal hemorrhoids    Myocardial infarction (HCC)    2005 - CABG x 5 2005   Paroxysmal atrial fibrillation (HCC)    Continue on Eliquis, Toprol, Tikosyn  Dr Graciela Husbands   RBBB    Tubular adenoma of colon    Unstable angina (HCC)    Upper airway cough syndrome 03/26/2022   Onset ? Jan 2023   - try off Entresto  03/25/2022   - max gerd rx 08/01/2022 >>>       Ventricular bigeminy    a. Event monitor 01/2013: NSR with PVCs and occ bigeminy.   Weakness 04/09/2016   5/17 ?post-viral vs other, 3/18, 6/20, 1/23  CFS  He is very active for his age  44/23  Post-concussion  Take Lexapro and Toprol qhs  Reduce Toprol to 1 qhs    Past Surgical History:  Procedure Laterality Date   APPENDECTOMY     CARDIAC CATHETERIZATION  2007   with patent graft anatomy atretic left internal mammary  artery to the LAD which is nonobstructive. Will restart study June 08, 2007   CARDIOVERSION N/A 03/27/2018   Procedure: CARDIOVERSION;  Surgeon: Iran Ouch, MD;  Location: [facility] ORS;  Service: Cardiovascular;  Laterality: N/A;   CARDIOVERSION N/A 01/04/2019   Procedure: CARDIOVERSION (CATH LAB);  Surgeon: Iran Ouch, MD;  Location: [facility] ORS;  Service: Cardiovascular;  Laterality: N/A;   CARDIOVERSION N/A 07/07/2019   Procedure: CARDIOVERSION;  Surgeon: Wendall Stade, MD;  Location: [facility] ENDOSCOPY;  Service: Cardiovascular;  Laterality: N/A;    CATARACT EXTRACTION W/PHACO Right 04/23/2016   Procedure: CATARACT EXTRACTION PHACO AND INTRAOCULAR LENS PLACEMENT (IOC);  Surgeon: [name], MD;  Location: [facility] ORS;  Service: Ophthalmology;  Laterality: Right;  [place] 1.09 AP% 19.3 CDE 13.38 Fluid pack lot # [id number]   CHOLECYSTECTOMY N/A 06/05/2017   Procedure: LAPAROSCOPIC CHOLECYSTECTOMY WITH INTRAOPERATIVE CHOLANGIOGRAM ERAS PATHWAY POSSIBLE NEEDLE CORE BIOPSY OF LIVER;  Surgeon: Michaell Cowing,  esr 51 12/31/2012 and no eos  05/2021 likely a COVID complication.  Prescribed cefdinir antibiotic, cough syrup with codeine, Medrol Dosepak  1/23 CAP --sx's - 80+% better. CXR: COPD changes with chronic basilar interstitial disease and suspect  superimposed acute infiltrate at RIGHT base.   Cardiac arrest [facility])    a. out-of-hospital arrest 11/24/2012 - EF 40-45%, patent grafts on cath, received [employer] Jude AICD.   Carotid disease, bilateral (HCC)    a. 0-39% by doppers.   Cataract    bil cataracts removed   Chest pain, atypical 07/21/2013   CHF (congestive heart failure) (HCC)    Chills with fever 11/24/2017   12/18 Recurrent - ?prostatitis  1/19 ?URI  6/20    Chronic calculous cholecystitis 06/05/2017   Chronic diastolic CHF (congestive heart failure) (HCC) 03/09/2018   10/19 Probable pacemaker  cardiomyopathy per Dr Graciela Husbands w/a plan to   1) INCREASE cozaar (losartan) 50 mg - take 1 tablet by mouth once daily - d/c. Losartan was stopped on 05/12/19 due to high K  2) START mexiletine 150 mg- take 1 tablet by mouth TWICE daily   Coronary artery disease    a. s/p MI/CABG 2005. b. s/p cath at time of VF arrest 10/2013 - grafts patent.   Cough    Diverticulosis    Elevated LFTs    a. 10/2012 felt due to cardiac arrest - Hepatitis C Ab reactive from 10/25/2012>>Hep C RNA PCR negative 11/13/2012.    GERD (gastroesophageal reflux disease)    Hyperlipidemia    Hypertension    Internal hemorrhoids    Myocardial infarction (HCC)    2005 - CABG x 5 2005   Paroxysmal atrial fibrillation (HCC)    Continue on Eliquis, Toprol, Tikosyn  Dr Graciela Husbands   RBBB    Tubular adenoma of colon    Unstable angina (HCC)    Upper airway cough syndrome 03/26/2022   Onset ? Jan 2023   - try off Entresto  03/25/2022   - max gerd rx 08/01/2022 >>>       Ventricular bigeminy    a. Event monitor 01/2013: NSR with PVCs and occ bigeminy.   Weakness 04/09/2016   5/17 ?post-viral vs other, 3/18, 6/20, 1/23  CFS  He is very active for his age  44/23  Post-concussion  Take Lexapro and Toprol qhs  Reduce Toprol to 1 qhs    Past Surgical History:  Procedure Laterality Date   APPENDECTOMY     CARDIAC CATHETERIZATION  2007   with patent graft anatomy atretic left internal mammary  artery to the LAD which is nonobstructive. Will restart study June 08, 2007   CARDIOVERSION N/A 03/27/2018   Procedure: CARDIOVERSION;  Surgeon: Iran Ouch, MD;  Location: [facility] ORS;  Service: Cardiovascular;  Laterality: N/A;   CARDIOVERSION N/A 01/04/2019   Procedure: CARDIOVERSION (CATH LAB);  Surgeon: Iran Ouch, MD;  Location: [facility] ORS;  Service: Cardiovascular;  Laterality: N/A;   CARDIOVERSION N/A 07/07/2019   Procedure: CARDIOVERSION;  Surgeon: Wendall Stade, MD;  Location: [facility] ENDOSCOPY;  Service: Cardiovascular;  Laterality: N/A;    CATARACT EXTRACTION W/PHACO Right 04/23/2016   Procedure: CATARACT EXTRACTION PHACO AND INTRAOCULAR LENS PLACEMENT (IOC);  Surgeon: [name], MD;  Location: [facility] ORS;  Service: Ophthalmology;  Laterality: Right;  [place] 1.09 AP% 19.3 CDE 13.38 Fluid pack lot # [id number]   CHOLECYSTECTOMY N/A 06/05/2017   Procedure: LAPAROSCOPIC CHOLECYSTECTOMY WITH INTRAOPERATIVE CHOLANGIOGRAM ERAS PATHWAY POSSIBLE NEEDLE CORE BIOPSY OF LIVER;  Surgeon: Michaell Cowing,

## 2023-06-04 ENCOUNTER — Other Ambulatory Visit: Payer: Self-pay | Admitting: Internal Medicine

## 2023-06-04 DIAGNOSIS — J471 Bronchiectasis with (acute) exacerbation: Secondary | ICD-10-CM | POA: Diagnosis not present

## 2023-06-10 ENCOUNTER — Encounter: Payer: Self-pay | Admitting: Internal Medicine

## 2023-06-10 ENCOUNTER — Ambulatory Visit: Payer: PPO | Admitting: Internal Medicine

## 2023-06-10 VITALS — BP 102/60 | HR 75 | Temp 98.7°F | Ht 67.0 in | Wt 132.0 lb

## 2023-06-10 DIAGNOSIS — E538 Deficiency of other specified B group vitamins: Secondary | ICD-10-CM | POA: Diagnosis not present

## 2023-06-10 DIAGNOSIS — R453 Demoralization and apathy: Secondary | ICD-10-CM

## 2023-06-10 DIAGNOSIS — R29898 Other symptoms and signs involving the musculoskeletal system: Secondary | ICD-10-CM

## 2023-06-10 DIAGNOSIS — Z0000 Encounter for general adult medical examination without abnormal findings: Secondary | ICD-10-CM

## 2023-06-10 DIAGNOSIS — I10 Essential (primary) hypertension: Secondary | ICD-10-CM

## 2023-06-10 DIAGNOSIS — F419 Anxiety disorder, unspecified: Secondary | ICD-10-CM

## 2023-06-10 LAB — CBC WITH DIFFERENTIAL/PLATELET
Basophils Absolute: 0 10*3/uL (ref 0.0–0.1)
Basophils Relative: 0.4 % (ref 0.0–3.0)
Eosinophils Absolute: 0.2 10*3/uL (ref 0.0–0.7)
Eosinophils Relative: 2.2 % (ref 0.0–5.0)
HCT: 32.7 % — ABNORMAL LOW (ref 39.0–52.0)
Hemoglobin: 10.8 g/dL — ABNORMAL LOW (ref 13.0–17.0)
Lymphocytes Relative: 22.1 % (ref 12.0–46.0)
Lymphs Abs: 1.7 10*3/uL (ref 0.7–4.0)
MCHC: 33.1 g/dL (ref 30.0–36.0)
MCV: 94.5 fl (ref 78.0–100.0)
Monocytes Absolute: 0.5 10*3/uL (ref 0.1–1.0)
Monocytes Relative: 7 % (ref 3.0–12.0)
Neutro Abs: 5.2 10*3/uL (ref 1.4–7.7)
Neutrophils Relative %: 68.3 % (ref 43.0–77.0)
Platelets: 163 10*3/uL (ref 150.0–400.0)
RBC: 3.46 Mil/uL — ABNORMAL LOW (ref 4.22–5.81)
RDW: 13.2 % (ref 11.5–15.5)
WBC: 7.6 10*3/uL (ref 4.0–10.5)

## 2023-06-10 LAB — COMPREHENSIVE METABOLIC PANEL
ALT: 14 U/L (ref 0–53)
AST: 24 U/L (ref 0–37)
Albumin: 3.8 g/dL (ref 3.5–5.2)
Alkaline Phosphatase: 92 U/L (ref 39–117)
BUN: 28 mg/dL — ABNORMAL HIGH (ref 6–23)
CO2: 29 mEq/L (ref 19–32)
Calcium: 11 mg/dL — ABNORMAL HIGH (ref 8.4–10.5)
Chloride: 100 mEq/L (ref 96–112)
Creatinine, Ser: 1.11 mg/dL (ref 0.40–1.50)
GFR: 59.22 mL/min — ABNORMAL LOW (ref >60.00)
Glucose, Bld: 87 mg/dL (ref 70–99)
Potassium: 4.6 mEq/L (ref 3.5–5.1)
Sodium: 132 mEq/L — ABNORMAL LOW (ref 135–145)
Total Bilirubin: 0.4 mg/dL (ref 0.2–1.2)
Total Protein: 7.8 g/dL (ref 6.0–8.3)

## 2023-06-10 LAB — T4, FREE: Free T4: 0.73 ng/dL (ref 0.60–1.60)

## 2023-06-10 MED ORDER — B COMPLEX VITAMINS PO CAPS: 1.0000 | ORAL_CAPSULE | Freq: Every day | ORAL | 3 refills | Status: AC

## 2023-06-10 NOTE — Assessment & Plan Note (Signed)
Chronic orthostatic BP drop, mostly asymptomatic On Valsartan 40 mg qd now Toprol 1/2 bid (25 mg) Compression socks Hydrate well

## 2023-06-10 NOTE — Assessment & Plan Note (Addendum)
D/c  B12. Start B complex qd

## 2023-06-10 NOTE — Assessment & Plan Note (Signed)
Stable, chronic. ?

## 2023-06-10 NOTE — Progress Notes (Signed)
Subjective:  Patient ID: [name] Christensen, male    DOB: 1932-02-20  Age: 89 y.o. MRN: [id number]  CC: Follow-up (2 [facility] F/U, Check B12 and discuss Valsartan)   HPI [employer] presents for fatigue.  Sleeping 11 hrs No falls F/u CAD, dyslipidemia, anxiety  Outpatient Medications Prior to Visit  Medication Sig Dispense Refill   acetaminophen (TYLENOL) 500 MG tablet Take 500 mg by mouth at bedtime.     alfuzosin (UROXATRAL) 10 MG 24 hr tablet Take 10 mg by mouth every evening.      apixaban (ELIQUIS) 2.5 MG TABS tablet Take 1 tablet (2.5 mg total) by mouth 2 (two) times daily. 180 tablet 3   ascorbic acid (VITAMIN C) 250 MG tablet Take 250 mg by mouth daily.     atorvastatin (LIPITOR) 40 MG tablet Take 20 mg by mouth daily.     betamethasone dipropionate 0.05 % cream Apply 1 application. topically 2 (two) times daily as needed.     cinacalcet (SENSIPAR) 30 MG tablet Take 1 tablet (30 mg total) by mouth 3 (three) times a week. 25 tablet 0   cyanocobalamin (VITAMIN B12) 1000 MCG tablet Take 1 tablet (1,000 mcg total) by mouth every other day as needed. 100 tablet 3   Dextromethorphan-guaiFENesin (MUCINEX DM PO) Take 30 mg by mouth in the morning and at bedtime.     dofetilide (TIKOSYN) 250 MCG capsule TAKE 1 CAPSULE BY MOUTH TWICE A DAY 180 capsule 1   donepezil (ARICEPT) 5 MG tablet TAKE ONE TABLET BY MOUTH EVERY NIGHT AT BEDTIME 90 tablet 3   dutasteride (AVODART) 0.5 MG capsule Take 0.5 mg by mouth every evening.      escitalopram (LEXAPRO) 10 MG tablet Take 1 tablet (10 mg total) by mouth at bedtime. 90 tablet 1   famotidine (PEPCID) 20 MG tablet Take 20 mg by mouth daily.     guaiFENesin (MUCINEX) 600 MG 12 hr tablet Take 600 mg by mouth as needed for cough.     HYDROcodone bit-homatropine (HYCODAN) 5-1.5 MG/5ML syrup TAKE FIVE MLS BY MOUTH EVERY EIGHT (EIGHT) HOURS AS NEEDED FOR COUGH. 240 mL 0   metoprolol succinate (TOPROL-XL) 50 MG 24 hr tablet Take 0.5 tablets (25 mg total) by  mouth at bedtime as needed. 180 tablet 1   Multiple Vitamin (MULTIVITAMIN WITH MINERALS) TABS tablet Take 1 tablet by mouth every other day.     nitroGLYCERIN (NITROSTAT) 0.4 MG SL tablet DISSOLVE 1 TABLET UNDER TONGUE AS NEEDEDFOR CHEST PAIN. MAY REPEAT 5 MINUTES APART 3 TIMES IF NEEDED 25 tablet 3   omeprazole (PRILOSEC) 20 MG capsule Take 20 mg by mouth daily.     Plecanatide 3 MG TABS Take 3 mg by mouth daily. Replaces (Linaclotide) Linzess     Polyethyl Glycol-Propyl Glycol (SYSTANE OP) Place 1 drop into both eyes daily as needed (burning eyes).      triamcinolone cream (KENALOG) 0.1 % APPLY 1 APPLICATION TOPICALLY TWO TIMES DAILY AS NEEDED AVOIDING FACE, GROIN ANDARMPITS (Patient taking differently: Apply 1 Application topically 2 (two) times daily.) 80 g 0   valsartan (DIOVAN) 40 MG tablet Take 1 tablet (40 mg total) by mouth daily. 90 tablet 3   iron polysaccharides (NIFEREX) 150 MG capsule Take 1 capsule (150 mg total) by mouth daily. 30 capsule 2   No facility-administered medications prior to visit.    ROS: Review of Systems  Constitutional:  Positive for fatigue. Negative for appetite change and unexpected weight change.  HENT:  Negative for congestion, nosebleeds, sneezing, sore throat and trouble swallowing.   Eyes:  Negative for itching and visual disturbance.  Respiratory:  Negative for cough.   Cardiovascular:  Negative for chest pain, palpitations and leg swelling.  Gastrointestinal:  Negative for abdominal distention, blood in stool, diarrhea and nausea.  Genitourinary:  Negative for frequency and hematuria.  Musculoskeletal:  Negative for back pain, gait problem, joint swelling and neck pain.  Skin:  Negative for rash.  Neurological:  Negative for dizziness, tremors, speech difficulty and weakness.  Psychiatric/Behavioral:  Positive for dysphoric mood. Negative for agitation, sleep disturbance and suicidal ideas. The patient is nervous/anxious.     Objective:  BP  102/60 (BP Location: Left Arm, Patient Position: Sitting, Cuff Size: Large)   Pulse 75   Temp 98.7 F (37.1 C) (Oral)   Ht 5\' 7"  (1.702 m)   Wt 132 lb (59.9 kg)   SpO2 98%   BMI 20.67 kg/m   BP Readings from Last 3 Encounters:  06/10/23 102/60  05/06/23 118/66  04/01/23 108/70    Wt Readings from Last 3 Encounters:  06/10/23 132 lb (59.9 kg)  05/06/23 135 lb 12.8 oz (61.6 kg)  04/01/23 134 lb (60.8 kg)    Physical Exam Constitutional:      General: He is not in acute distress.    Appearance: He is well-developed.     Comments: NAD  Eyes:     Conjunctiva/sclera: Conjunctivae normal.     Pupils: Pupils are equal, round, and reactive to light.  Neck:     Thyroid: No thyromegaly.     Vascular: No JVD.  Cardiovascular:     Rate and Rhythm: Normal rate and regular rhythm.     Heart sounds: Normal heart sounds. No murmur heard.    No friction rub. No gallop.  Pulmonary:     Effort: Pulmonary effort is normal. No respiratory distress.     Breath sounds: Normal breath sounds. No wheezing or rales.  Chest:     Chest wall: No tenderness.  Abdominal:     General: Bowel sounds are normal. There is no distension.     Palpations: Abdomen is soft. There is no mass.     Tenderness: There is no abdominal tenderness. There is no guarding or rebound.  Musculoskeletal:        General: No tenderness. Normal range of motion.     Cervical back: Normal range of motion.  Lymphadenopathy:     Cervical: No cervical adenopathy.  Skin:    General: Skin is warm and dry.     Findings: No rash.  Neurological:     Mental Status: He is alert and oriented to person, place, and time.     Cranial Nerves: No cranial nerve deficit.     Motor: No abnormal muscle tone.     Coordination: Coordination normal.     Gait: Gait normal.     Deep Tendon Reflexes: Reflexes are normal and symmetric.  Psychiatric:        Behavior: Behavior normal.        Thought Content: Thought content normal.         Judgment: Judgment normal.     Lab Results  Component Value Date   WBC 7.6 04/01/2023   HGB 11.4 (L) 04/01/2023   HCT 34.4 (L) 04/01/2023   PLT 175.0 04/01/2023   GLUCOSE 95 04/01/2023   CHOL 93 09/30/2022   TRIG 113.0 09/30/2022   HDL 44.80 09/30/2022   LDLCALC 26  09/30/2022   ALT 10 04/01/2023   AST 19 04/01/2023   NA 132 (L) 04/01/2023   K 4.4 04/01/2023   CL 100 04/01/2023   CREATININE 1.22 04/01/2023   BUN 23 04/01/2023   CO2 27 04/01/2023   TSH 2.05 04/01/2023   PSA 12.45 (H) 02/06/2016   INR 1.1 04/02/2017   HGBA1C 6.1 12/30/2019    DG Chest 2 View  Result Date: 01/12/2023 CLINICAL DATA:  Fall. Concern for recurrent right pneumothorax. Patient reports no difficulty breathing at this time. EXAM: CHEST - 2 VIEW COMPARISON:  Chest radiograph 12/31/2022, 12/30/2022, 12/29/2022; CT chest 11/20/2022 FINDINGS: The heart size and mediastinal contours are within normal limits. Left chest wall AICD with leads overlying the right atrium and right ventricle. Postoperative changes of median sternotomy and coronary artery bypass graft. Aortic calcifications. No pneumothorax. Chronic basilar interstitial thickening and hyperinflation. No new focal consolidation no pleural effusion. Multilevel degenerative changes in the thoracic spine. Surgical clips overlie the right upper abdomen no acute osseous abnormality. Old fracture of the anterior right fourth rib. IMPRESSION: No acute cardiopulmonary abnormality. Specifically, no pneumothorax as queried. Electronically Signed   By: Sherron Ales M.D.   On: 01/12/2023 16:45   CT Head Wo Contrast  Result Date: 01/12/2023 CLINICAL DATA:  Fall EXAM: CT HEAD WITHOUT CONTRAST CT CERVICAL SPINE WITHOUT CONTRAST TECHNIQUE: Multidetector CT imaging of the head and cervical spine was performed following the standard protocol without intravenous contrast. Multiplanar CT image reconstructions of the cervical spine were also generated. RADIATION DOSE  REDUCTION: This exam was performed according to the departmental dose-optimization program which includes automated exposure control, adjustment of the mA and/or kV according to patient size and/or use of iterative reconstruction technique. COMPARISON:  12/26/2022 FINDINGS: CT HEAD FINDINGS Brain: No evidence of acute infarction, hemorrhage, hydrocephalus, extra-axial collection or mass lesion/mass effect. Periventricular and deep white matter hypodensity. Vascular: No hyperdense vessel or unexpected calcification. Skull: Normal. Negative for fracture or focal lesion. Sinuses/Orbits: No acute finding. Other: Soft tissue hematoma of the right forehead (series 2, image 41) CT CERVICAL SPINE FINDINGS Alignment: Normal. Skull base and vertebrae: No acute fracture. No primary bone lesion or focal pathologic process. Soft tissues and spinal canal: No prevertebral fluid or swelling. No visible canal hematoma. Disc levels: Moderate disc space height loss and osteophytosis of C3-C5 with otherwise mild disc degenerative change. Upper chest: Negative. Other: None. IMPRESSION: 1. No acute intracranial pathology. Small-vessel white matter disease. 2. Soft tissue hematoma of the right forehead. 3. No fracture or static subluxation of the cervical spine. Electronically Signed   By: Jearld Lesch M.D.   On: 01/12/2023 14:33   CT Cervical Spine Wo Contrast  Result Date: 01/12/2023 CLINICAL DATA:  Fall EXAM: CT HEAD WITHOUT CONTRAST CT CERVICAL SPINE WITHOUT CONTRAST TECHNIQUE: Multidetector CT imaging of the head and cervical spine was performed following the standard protocol without intravenous contrast. Multiplanar CT image reconstructions of the cervical spine were also generated. RADIATION DOSE REDUCTION: This exam was performed according to the departmental dose-optimization program which includes automated exposure control, adjustment of the mA and/or kV according to patient size and/or use of iterative reconstruction  technique. COMPARISON:  12/26/2022 FINDINGS: CT HEAD FINDINGS Brain: No evidence of acute infarction, hemorrhage, hydrocephalus, extra-axial collection or mass lesion/mass effect. Periventricular and deep white matter hypodensity. Vascular: No hyperdense vessel or unexpected calcification. Skull: Normal. Negative for fracture or focal lesion. Sinuses/Orbits: No acute finding. Other: Soft tissue hematoma of the right forehead (series 2, image  41) CT CERVICAL SPINE FINDINGS Alignment: Normal. Skull base and vertebrae: No acute fracture. No primary bone lesion or focal pathologic process. Soft tissues and spinal canal: No prevertebral fluid or swelling. No visible canal hematoma. Disc levels: Moderate disc space height loss and osteophytosis of C3-C5 with otherwise mild disc degenerative change. Upper chest: Negative. Other: None. IMPRESSION: 1. No acute intracranial pathology. Small-vessel white matter disease. 2. Soft tissue hematoma of the right forehead. 3. No fracture or static subluxation of the cervical spine. Electronically Signed   By: Jearld Lesch M.D.   On: 01/12/2023 14:33    Assessment & Plan:   Problem List Items Addressed This Visit     Essential hypertension    Chronic orthostatic BP drop, mostly asymptomatic On Valsartan 40 mg qd now Toprol 1/2 bid (25 mg) Compression socks Hydrate well      Anxiety disorder    Take Lexapro at hs      Apathy    A little better, less apathetic On Lexapro      Vitamin B12 deficiency (non anemic)    D/c  B12. Start B complex qd      Weakness of both legs    Stable, chronic       Relevant Orders   CBC with Differential/Platelet   Comprehensive metabolic panel   T4, free   Other Visit Diagnoses     Well adult exam    -  Primary   Relevant Orders   T4, free         Meds ordered this encounter  Medications   b complex vitamins capsule    Sig: Take 1 capsule by mouth daily.    Dispense:  100 capsule    Refill:  3       Follow-up: Return in about 3 months (around 09/10/2023) for a follow-up visit.  [name], MD

## 2023-06-10 NOTE — Assessment & Plan Note (Signed)
Take Lexapro at hs

## 2023-06-10 NOTE — Assessment & Plan Note (Signed)
A little better, less apathetic On Lexapro

## 2023-06-24 ENCOUNTER — Telehealth: Payer: Self-pay | Admitting: Internal Medicine

## 2023-06-24 NOTE — Telephone Encounter (Signed)
Pt called stating he used a different pharmacy for his medication and noticed his Tikosyn pills were a different color than before. Nurse informed pt that sometimes medication color and size can change based on the manufacture the pharmacy uses. Nurse informed pt that the best way to ensure it's the correct medication, is to contact the pharmacy.  Pt verbalized understanding.

## 2023-06-24 NOTE — Telephone Encounter (Signed)
  Pt c/o medication issue:  1. Name of Medication: dofetilide (TIKOSYN) 250 MCG capsule   2. How are you currently taking this medication (dosage and times per day)?   TAKE 1 CAPSULE BY MOUTH TWICE A DAY    3. Are you having a reaction (difficulty breathing--STAT)? No  4. What is your medication issue? The patient would like to clarify whether he needs to take this medication

## 2023-06-29 ENCOUNTER — Emergency Department
Admission: EM | Admit: 2023-06-29 | Discharge: 2023-06-29 | Disposition: A | Discharge status: Home / Self Care | Payer: PPO | Attending: Student in an Organized Health Care Education/Training Program | Admitting: Student in an Organized Health Care Education/Training Program

## 2023-06-29 ENCOUNTER — Other Ambulatory Visit: Payer: Self-pay

## 2023-06-29 ENCOUNTER — Encounter: Payer: Self-pay | Admitting: Emergency Medicine

## 2023-06-29 DIAGNOSIS — R531 Weakness: Secondary | ICD-10-CM | POA: Insufficient documentation

## 2023-06-29 DIAGNOSIS — R55 Syncope and collapse: Secondary | ICD-10-CM

## 2023-06-29 DIAGNOSIS — E860 Dehydration: Secondary | ICD-10-CM

## 2023-06-29 DIAGNOSIS — R5383 Other fatigue: Secondary | ICD-10-CM | POA: Insufficient documentation

## 2023-06-29 DIAGNOSIS — I959 Hypotension, unspecified: Secondary | ICD-10-CM | POA: Insufficient documentation

## 2023-06-29 DIAGNOSIS — R197 Diarrhea, unspecified: Secondary | ICD-10-CM | POA: Diagnosis not present

## 2023-06-29 LAB — TROPONIN I (HIGH SENSITIVITY)
Troponin I (High Sensitivity): 5 ng/L (ref <18)
Troponin I (High Sensitivity): 4 ng/L (ref <18)

## 2023-06-29 LAB — CBC
HCT: 32.4 % — ABNORMAL LOW (ref 39.0–52.0)
Hemoglobin: 10.6 g/dL — ABNORMAL LOW (ref 13.0–17.0)
MCH: 31.1 pg (ref 26.0–34.0)
MCHC: 32.7 g/dL (ref 30.0–36.0)
MCV: 95 fL (ref 80.0–100.0)
Platelets: 160 10*3/uL (ref 150–400)
RBC: 3.41 MIL/uL — ABNORMAL LOW (ref 4.22–5.81)
RDW: 12.8 % (ref 11.5–15.5)
WBC: 7 10*3/uL (ref 4.0–10.5)
nRBC: 0 % (ref 0.0–0.2)

## 2023-06-29 LAB — BASIC METABOLIC PANEL
Anion gap: 7 (ref 5–15)
BUN: 20 mg/dL (ref 8–23)
CO2: 23 mmol/L (ref 22–32)
Calcium: 10.5 mg/dL — ABNORMAL HIGH (ref 8.9–10.3)
Chloride: 104 mmol/L (ref 98–111)
Creatinine, Ser: 1.19 mg/dL (ref 0.61–1.24)
GFR, Estimated: 59 mL/min — ABNORMAL LOW (ref >60)
Glucose, Bld: 102 mg/dL — ABNORMAL HIGH (ref 70–99)
Potassium: 4.1 mmol/L (ref 3.5–5.1)
Sodium: 134 mmol/L — ABNORMAL LOW (ref 135–145)

## 2023-06-29 MED ORDER — SODIUM CHLORIDE 0.9 % IV BOLUS
1000.0000 mL | Freq: Once | INTRAVENOUS | Status: AC
  Administered 2023-06-29: 1000 mL via INTRAVENOUS

## 2023-06-29 NOTE — ED Triage Notes (Signed)
Pt with son and came from urgent care due to hypotension. Son states that BP at urgent care was 87/53. Pt denies current light headedness, but states earlier he did when his BP was lower. PT states feeling better now. Pt states this has been going on for the last 3 weeks, on and off. Son states earlier today, pt had a near syncopal episode, where his legs went out.

## 2023-06-29 NOTE — ED Notes (Signed)
Pacemaker interrogated. 

## 2023-06-29 NOTE — ED Provider Notes (Signed)
[facility] Provider Note    Event Date/Time   First MD Initiated Contact with Patient 06/29/23 1058     (approximate)   History   Hypotension   HPI  [name] Christensen is a 89 y.o. male presents to the ER for 90s evaluated today.  Has had multiple episodes of this in the past largely felt to be related to antihypertensive medications.  States last week he did have several episodes of loose diarrhea.  Has not had any melena no hematochezia.  He denies any chest pain or pressure.  Went to urgent care for evaluation was found to be hypotensive.  Was sent to the ER by the time he got here his blood pressure had improved.  He feels well denies any discomfort, spouse states that he has been feeling weak and fatigued over the past several weeks.     Physical Exam   Triage Vital Signs: ED Triage Vitals [06/29/23 1028]  Encounter Vitals Group     BP 106/65     Systolic BP Percentile      Diastolic BP Percentile      Pulse Rate 70     Resp 18     Temp 97.6 F (36.4 C)     Temp src      SpO2 98 %     Weight 130 lb (59 kg)     Height 5\' 6"  (1.676 m)     Head Circumference      Peak Flow      Pain Score 0     Pain Loc      Pain Education      Exclude from Growth Chart     Most recent vital signs: Vitals:   06/29/23 1430 06/29/23 1455  BP: (!) 162/78   Pulse: 70   Resp: 12   Temp:  98 F (36.7 C)  SpO2: 95%      Constitutional: Alert  Eyes: Conjunctivae are normal.  Head: Atraumatic. Nose: No congestion/rhinnorhea. Mouth/Throat: Mucous membranes are moist.   Neck: Painless ROM.  Cardiovascular:   Good peripheral circulation. Respiratory: Normal respiratory effort.  No retractions.  Gastrointestinal: Soft and nontender.  Musculoskeletal:  no deformity Neurologic:  MAE spontaneously. No gross focal neurologic deficits are appreciated.  Skin:  Skin is warm, dry and intact. No rash noted. Psychiatric: Mood and affect are normal. Speech and  behavior are normal.    ED Results / Procedures / Treatments   Labs (all labs ordered are listed, but only abnormal results are displayed) Labs Reviewed  BASIC METABOLIC PANEL - Abnormal; Notable for the following components:      Result Value   Sodium 134 (*)    Glucose, Bld 102 (*)    Calcium 10.5 (*)    GFR, Estimated 59 (*)    All other components within normal limits  CBC - Abnormal; Notable for the following components:   RBC 3.41 (*)    Hemoglobin 10.6 (*)    HCT 32.4 (*)    All other components within normal limits  URINALYSIS, ROUTINE W REFLEX MICROSCOPIC  CBG MONITORING, ED  TROPONIN I (HIGH SENSITIVITY)  TROPONIN I (HIGH SENSITIVITY)     EKG  ED ECG REPORT I, Willy Eddy, the attending physician, personally viewed and interpreted this ECG.   Date: 06/29/2023  EKG Time: 10:39  Rate: 70  Rhythm: a paced with prolonged av conduction  Axis: nrmal  Intervals:rbbb  ST&T Change: no stmei    RADIOLOGY  Please see ED Course for my review and interpretation.  I personally reviewed all radiographic images ordered to evaluate for the above acute complaints and reviewed radiology reports and findings.  These findings were personally discussed with the patient.  Please see medical record for radiology report.    PROCEDURES:  Critical Care performed:   Procedures   MEDICATIONS ORDERED IN ED: Medications  sodium chloride 0.9 % bolus 1,000 mL (0 mLs Intravenous Stopped 06/29/23 1408)     IMPRESSION / MDM / ASSESSMENT AND PLAN / ED COURSE  I reviewed the triage vital signs and the nursing notes.                              Differential diagnosis includes, but is not limited to, dehydration, electrolyte abnormality, orthostasis, medication reaction, anemia,  Patient presenting to the ER for evaluation of symptoms as described above.  Based on symptoms, risk factors and considered above differential, this presenting complaint could reflect a potentially  life-threatening illness therefore the patient will be placed on continuous pulse oximetry and telemetry for monitoring.  Laboratory evaluation will be sent to evaluate for the above complaints.  Well-appearing.  Not febrile.  Will send blood work for the above differential will give IV fluids.  Not seem consistent with sepsis   Clinical Course as of 06/29/23 1501  Sun Jun 29, 2023  1209 No acute anemia. [PR]  1243 No leukocytosis.  Troponin pending. [PR]  1346 Pacemaker interrogation shows no events. [PR]  1346 Blood pressure significantly improved with IV hydration. [PR]  1459 Repeat troponin negative.  Patient able to stand without any near syncopal symptoms feels significantly improved after IV hydration.  Discussed option for observation the hospital for further hydration and reassessment versus outpatient follow-up.  Patient feels comfortable with discharge and is reliable.  Discussed signs and symptoms for which patient should return to the ER. [PR]    Clinical Course User Index [PR] Willy Eddy, MD     FINAL CLINICAL IMPRESSION(S) / ED DIAGNOSES   Final diagnoses:  Dehydration  Syncope and collapse     Rx / DC Orders   ED Discharge Orders     None        Note:  This document was prepared using Dragon voice recognition software and may include unintentional dictation errors.    Willy Eddy, MD 06/29/23 1501

## 2023-06-30 ENCOUNTER — Ambulatory Visit: Payer: PPO | Attending: Cardiology | Admitting: Cardiology

## 2023-06-30 ENCOUNTER — Encounter: Payer: Self-pay | Admitting: Cardiology

## 2023-06-30 ENCOUNTER — Ambulatory Visit: Payer: Self-pay

## 2023-06-30 ENCOUNTER — Telehealth: Payer: Self-pay

## 2023-06-30 VITALS — BP 120/60 | HR 70 | Ht 66.0 in | Wt 131.5 lb

## 2023-06-30 DIAGNOSIS — Z95810 Presence of automatic (implantable) cardiac defibrillator: Secondary | ICD-10-CM

## 2023-06-30 DIAGNOSIS — Z79899 Other long term (current) drug therapy: Secondary | ICD-10-CM

## 2023-06-30 DIAGNOSIS — I951 Orthostatic hypotension: Secondary | ICD-10-CM | POA: Diagnosis not present

## 2023-06-30 DIAGNOSIS — I4819 Other persistent atrial fibrillation: Secondary | ICD-10-CM

## 2023-06-30 DIAGNOSIS — I493 Ventricular premature depolarization: Secondary | ICD-10-CM

## 2023-06-30 LAB — CUP PACEART INCLINIC DEVICE CHECK
Battery Remaining Longevity: 27 mo
Brady Statistic RA Percent Paced: 86 %
Brady Statistic RV Percent Paced: 3.8 %
Date Time Interrogation Session: 20240805164221
HighPow Impedance: 70.875
Implantable Lead Connection Status: 753985
Implantable Lead Connection Status: 753985
Implantable Lead Implant Date: 20131218
Implantable Lead Implant Date: 20180514
Implantable Lead Location: 753859
Implantable Lead Location: 753860
Implantable Lead Model: 5076
Implantable Pulse Generator Implant Date: 20180514
Lead Channel Impedance Value: 375 Ohm
Lead Channel Impedance Value: 450 Ohm
Lead Channel Pacing Threshold Amplitude: 0.5 V
Lead Channel Pacing Threshold Amplitude: 0.5 V
Lead Channel Pacing Threshold Amplitude: 1.5 V
Lead Channel Pacing Threshold Amplitude: 1.5 V
Lead Channel Pacing Threshold Pulse Width: 0.5 ms
Lead Channel Pacing Threshold Pulse Width: 0.5 ms
Lead Channel Pacing Threshold Pulse Width: 0.5 ms
Lead Channel Pacing Threshold Pulse Width: 0.5 ms
Lead Channel Sensing Intrinsic Amplitude: 2.9 mV
Lead Channel Sensing Intrinsic Amplitude: 3.1 mV
Lead Channel Setting Pacing Amplitude: 1.375
Lead Channel Setting Pacing Amplitude: 2 V
Lead Channel Setting Pacing Pulse Width: 0.5 ms
Lead Channel Setting Sensing Sensitivity: 0.5 mV
Pulse Gen Serial Number: 1245762
Zone Setting Status: 755011

## 2023-06-30 MED ORDER — VALSARTAN 40 MG PO TABS: 20.0000 mg | ORAL_TABLET | Freq: Every evening | ORAL | 0 refills | Status: DC

## 2023-06-30 NOTE — Patient Instructions (Signed)
Medication Instructions:   START Valsartan 40 mg- Take 1/2 tablet (20 mg) by mouth every evening daily.  *If you need a refill on your cardiac medications before your next appointment, please call your pharmacy*   Lab Work:  NONE ordered today  If you have labs (blood work) drawn today and your tests are completely normal, you will receive your results only by: MyChart Message (if you have MyChart) OR A paper copy in the mail If you have any lab test that is abnormal or we need to change your treatment, we will call you to review the results.   Testing/Procedures:  None ordered today   Follow-Up: At [facility], you and your health needs are our priority.  As part of our continuing mission to provide you with exceptional heart care, we have created designated Provider Care Teams.  These Care Teams include your primary Cardiologist (physician) and Advanced Practice Providers (APPs -  Physician Assistants and Nurse Practitioners) who all work together to provide you with the care you need, when you need it.  We recommend signing up for the patient portal called "MyChart".  Sign up information is provided on this After Visit Summary.  MyChart is used to connect with patients for Virtual Visits (Telemedicine).  Patients are able to view lab/test results, encounter notes, upcoming appointments, etc.  Non-urgent messages can be sent to your provider as well.   To learn more about what you can do with MyChart, go to [email or web address].    Your next appointment:   6 week(s)  Provider:   Sherie Don, NP    Other Instructions  Please check and log your Blood pressure everyday. Please bring reading with you to next office visit. Please contact PCP for future appointment as soon as possible.

## 2023-06-30 NOTE — Telephone Encounter (Signed)
Following alert received from [employer] received for 13 AMS, [place] PVCs noted. 05/15/23 episodes c/w intermittent RV loss of capture. Routed to clinic for review.  Spoke to [place] with [employer] [facility] who advised concern for VT and loss of capture. Patient was seen in ER yesterday for near syncopal and dehydration. Apt made today in [place] with Suzann. [employer] will be present and made aware to [place], Satira Sark. Jude.

## 2023-06-30 NOTE — Progress Notes (Signed)
reflux disease)    Hyperlipidemia    Hypertension    Internal hemorrhoids    Myocardial infarction (HCC)    2005 - CABG x 5 2005   Paroxysmal atrial fibrillation (HCC)    Continue on Eliquis, Toprol, Tikosyn  Dr Graciela Husbands   RBBB    Tubular adenoma of colon    Unstable angina (HCC)    Upper airway cough syndrome 03/26/2022   Onset ? Jan 2023   - try off Entresto  03/25/2022   - max gerd rx 08/01/2022 >>>       Ventricular bigeminy    a. Event monitor 01/2013: NSR with PVCs and occ bigeminy.   Weakness 04/09/2016   5/17 ?post-viral vs other, 3/18, 6/20, 1/23  CFS  He is very active  for his age  78/23  Post-concussion  Take Lexapro and Toprol qhs  Reduce Toprol to 1 qhs    Past Surgical History:  Procedure Laterality Date   APPENDECTOMY     CARDIAC CATHETERIZATION  2007   with patent graft anatomy atretic left internal mammary  artery to the LAD which is nonobstructive. Will restart study June 08, 2007   CARDIOVERSION N/A 03/27/2018   Procedure: CARDIOVERSION;  Surgeon: Iran Ouch, MD;  Location: [facility] ORS;  Service: Cardiovascular;  Laterality: N/A;   CARDIOVERSION N/A 01/04/2019   Procedure: CARDIOVERSION (CATH LAB);  Surgeon: Iran Ouch, MD;  Location: [facility] ORS;  Service: Cardiovascular;  Laterality: N/A;   CARDIOVERSION N/A 07/07/2019   Procedure: CARDIOVERSION;  Surgeon: Wendall Stade, MD;  Location: [facility] ENDOSCOPY;  Service: Cardiovascular;  Laterality: N/A;   CATARACT EXTRACTION W/PHACO Right 04/23/2016   Procedure: CATARACT EXTRACTION PHACO AND INTRAOCULAR LENS PLACEMENT (IOC);  Surgeon: [name], MD;  Location: [facility] ORS;  Service: Ophthalmology;  Laterality: Right;  [place] 1.09 AP% 19.3 CDE 13.38 Fluid pack lot # [id number]   CHOLECYSTECTOMY N/A 06/05/2017   Procedure: LAPAROSCOPIC CHOLECYSTECTOMY WITH INTRAOPERATIVE CHOLANGIOGRAM ERAS PATHWAY POSSIBLE NEEDLE CORE BIOPSY OF LIVER;  Surgeon: [name], MD;  Location: [facility] OR;  Service: General;  Laterality: N/A;  ERAS PATHWAY   COLONOSCOPY     CORONARY ARTERY BYPASS GRAFT  2005   EYE SURGERY     ICD GENERATOR CHANGEOUT N/A 04/07/2017   Procedure: ICD Generator Changeout;  Surgeon: Duke Salvia, MD;  Location: [facility] INVASIVE CV LAB;  Service: Cardiovascular;  Laterality: N/A;   IMPLANTABLE CARDIOVERTER DEFIBRILLATOR IMPLANT N/A 11/19/2012   STJ single chamber ICD implanted by Dr Graciela Husbands for cardiac arrest    INGUINAL HERNIA REPAIR Bilateral 01/17/2015   Procedure: BILATERAL LAPAROSCOPIC INGUINAL HERNIA REPAIR WITH LEFT FEMORAL HERNIA REPAIR;  Surgeon: [name], MD;  Location: [facility] OR;  Service:  General;  Laterality: Bilateral;   INSERTION OF MESH Bilateral 01/17/2015   Procedure: INSERTION OF MESH;  Surgeon: [name], MD;  Location: [facility] OR;  Service: General;  Laterality: Bilateral;   IR CATHETER TUBE CHANGE  12/27/2022   LAPAROSCOPIC CHOLECYSTECTOMY  06/05/2017   LEAD INSERTION N/A 04/07/2017   Procedure: RA Lead Insertion;  Surgeon: Duke Salvia, MD;  Location: [facility] INVASIVE CV LAB;  Service: Cardiovascular;  Laterality: N/A;   LEAD REVISION/REPAIR N/A 04/08/2017   Procedure: Atrial Lead Revision/Repair;  Surgeon: Duke Salvia, MD;  Location: [facility] INVASIVE CV LAB;  Service: Cardiovascular;  Laterality: N/A;   LEFT HEART CATH AND CORONARY ANGIOGRAPHY Left 05/14/2019   Procedure: LEFT HEART CATH AND CORONARY ANGIOGRAPHY;  Surgeon: Iran Ouch, MD;  Location: [facility] INVASIVE CV  reflux disease)    Hyperlipidemia    Hypertension    Internal hemorrhoids    Myocardial infarction (HCC)    2005 - CABG x 5 2005   Paroxysmal atrial fibrillation (HCC)    Continue on Eliquis, Toprol, Tikosyn  Dr Graciela Husbands   RBBB    Tubular adenoma of colon    Unstable angina (HCC)    Upper airway cough syndrome 03/26/2022   Onset ? Jan 2023   - try off Entresto  03/25/2022   - max gerd rx 08/01/2022 >>>       Ventricular bigeminy    a. Event monitor 01/2013: NSR with PVCs and occ bigeminy.   Weakness 04/09/2016   5/17 ?post-viral vs other, 3/18, 6/20, 1/23  CFS  He is very active  for his age  78/23  Post-concussion  Take Lexapro and Toprol qhs  Reduce Toprol to 1 qhs    Past Surgical History:  Procedure Laterality Date   APPENDECTOMY     CARDIAC CATHETERIZATION  2007   with patent graft anatomy atretic left internal mammary  artery to the LAD which is nonobstructive. Will restart study June 08, 2007   CARDIOVERSION N/A 03/27/2018   Procedure: CARDIOVERSION;  Surgeon: Iran Ouch, MD;  Location: [facility] ORS;  Service: Cardiovascular;  Laterality: N/A;   CARDIOVERSION N/A 01/04/2019   Procedure: CARDIOVERSION (CATH LAB);  Surgeon: Iran Ouch, MD;  Location: [facility] ORS;  Service: Cardiovascular;  Laterality: N/A;   CARDIOVERSION N/A 07/07/2019   Procedure: CARDIOVERSION;  Surgeon: Wendall Stade, MD;  Location: [facility] ENDOSCOPY;  Service: Cardiovascular;  Laterality: N/A;   CATARACT EXTRACTION W/PHACO Right 04/23/2016   Procedure: CATARACT EXTRACTION PHACO AND INTRAOCULAR LENS PLACEMENT (IOC);  Surgeon: [name], MD;  Location: [facility] ORS;  Service: Ophthalmology;  Laterality: Right;  [place] 1.09 AP% 19.3 CDE 13.38 Fluid pack lot # [id number] H   CHOLECYSTECTOMY N/A 06/05/2017   Procedure: LAPAROSCOPIC CHOLECYSTECTOMY WITH INTRAOPERATIVE CHOLANGIOGRAM ERAS PATHWAY POSSIBLE NEEDLE CORE BIOPSY OF LIVER;  Surgeon: [name], MD;  Location: [facility] OR;  Service: General;  Laterality: N/A;  ERAS PATHWAY   COLONOSCOPY     CORONARY ARTERY BYPASS GRAFT  2005   EYE SURGERY     ICD GENERATOR CHANGEOUT N/A 04/07/2017   Procedure: ICD Generator Changeout;  Surgeon: Duke Salvia, MD;  Location: [facility] INVASIVE CV LAB;  Service: Cardiovascular;  Laterality: N/A;   IMPLANTABLE CARDIOVERTER DEFIBRILLATOR IMPLANT N/A 11/19/2012   STJ single chamber ICD implanted by Dr Graciela Husbands for cardiac arrest    INGUINAL HERNIA REPAIR Bilateral 01/17/2015   Procedure: BILATERAL LAPAROSCOPIC INGUINAL HERNIA REPAIR WITH LEFT FEMORAL HERNIA REPAIR;  Surgeon: [name], MD;  Location: [facility] OR;  Service:  General;  Laterality: Bilateral;   INSERTION OF MESH Bilateral 01/17/2015   Procedure: INSERTION OF MESH;  Surgeon: [name], MD;  Location: [facility] OR;  Service: General;  Laterality: Bilateral;   IR CATHETER TUBE CHANGE  12/27/2022   LAPAROSCOPIC CHOLECYSTECTOMY  06/05/2017   LEAD INSERTION N/A 04/07/2017   Procedure: RA Lead Insertion;  Surgeon: Duke Salvia, MD;  Location: [facility] INVASIVE CV LAB;  Service: Cardiovascular;  Laterality: N/A;   LEAD REVISION/REPAIR N/A 04/08/2017   Procedure: Atrial Lead Revision/Repair;  Surgeon: Duke Salvia, MD;  Location: [facility] INVASIVE CV LAB;  Service: Cardiovascular;  Laterality: N/A;   LEFT HEART CATH AND CORONARY ANGIOGRAPHY Left 05/14/2019   Procedure: LEFT HEART CATH AND CORONARY ANGIOGRAPHY;  Surgeon: Iran Ouch, MD;  Location: [facility] INVASIVE CV  Cardiology Office Note Date:  06/30/2023  Patient ID:  Alyus, Biggs 02/14/1932, MRN [id number] PCP:  Tresa Garter, MD  Cardiologist:  Charlton Haws, MD Electrophysiologist: Sherryl Manges, MD     Chief Complaint: ICD loss of capture  History of Present Illness: [name] Christensen is a 89 y.o. male with PMH notable for ICM, PVCs, HFrEF, persis AFib, bradycardia / chronotropic incompetence, RBBB; seen today for Sherryl Manges, MD for acute visit due to RV loss of capture.   The patient was seen in [facility] ER yesterday. He was having diarrhea, weakness, dizziness. He did not have a home BP cuff, but borrowed his daughters and BP was 80/60. By the time he was eval'd in ER, BP had improved to 160/78.  Device clinic rec'd an alert this morning for potential loss of RV capture and concerned for NSVT or bigeminal PVCs.  Given the hypotension and weakness yesterday with the concern for NSVT, he was asked to come into clinic today to be evaluated.   The patient states that he has a very poor appetite, and has been supplementing significantly with Ensure, drinking 3-4 per day in the past few weeks. He has long battled constipation, except for the past few weeks, and thinks that the dietary switch to increased Ensure may have prompted the diarrhea.  Today, he denies palpitations, chest pain, chest pressure. No nausea, vomiting.   He diligently takes eliquis BID, no bleeding concerns.   Device Information: St. Jude dual chamber ICD, imp 10/2012; cardiac arrest secondary prevention MDT RA lead imp 03/2017  AAD History: Amiodarone, stopped 7/19 Tikosyn -   Past Medical History:  Diagnosis Date   Actinic keratosis 06/09/2019   L sup forehead at hairline - bx proven   AICD (automatic cardioverter/defibrillator) present 04/07/2017   Allergy    Anxiety    BPH (benign prostatic hypertrophy)    elevated PSA Dr. Lindell Noe Bx 2010   CAP (community acquired pneumonia) 10/27/2012   rx levaquin  12/14/12  - esr 51 12/31/2012 and no eos  05/2021 likely a COVID complication.  Prescribed cefdinir antibiotic, cough syrup with codeine, Medrol Dosepak  1/23 CAP --sx's - 80+% better. CXR: COPD changes with chronic basilar interstitial disease and suspect  superimposed acute infiltrate at RIGHT base.   Cardiac arrest [facility])    a. out-of-hospital arrest 11/24/2012 - EF 40-45%, patent grafts on cath, received St. Jude AICD.   Carotid disease, bilateral (HCC)    a. 0-39% by doppers.   Cataract    bil cataracts removed   Chest pain, atypical 07/21/2013   CHF (congestive heart failure) (HCC)    Chills with fever 11/24/2017   12/18 Recurrent - ?prostatitis  1/19 ?URI  6/20    Chronic calculous cholecystitis 06/05/2017   Chronic diastolic CHF (congestive heart failure) (HCC) 03/09/2018   10/19 Probable pacemaker cardiomyopathy per Dr Graciela Husbands w/a plan to   1) INCREASE cozaar (losartan) 50 mg - take 1 tablet by mouth once daily - d/c. Losartan was stopped on 05/12/19 due to high K  2) START mexiletine 150 mg- take 1 tablet by mouth TWICE daily   Coronary artery disease    a. s/p MI/CABG 2005. b. s/p cath at time of VF arrest 10/2013 - grafts patent.   Cough    Diverticulosis    Elevated LFTs    a. 10/2012 felt due to cardiac arrest - Hepatitis C Ab reactive from 10/30/2012>>Hep C RNA PCR negative 11/24/2012.    GERD (gastroesophageal  reflux disease)    Hyperlipidemia    Hypertension    Internal hemorrhoids    Myocardial infarction (HCC)    2005 - CABG x 5 2005   Paroxysmal atrial fibrillation (HCC)    Continue on Eliquis, Toprol, Tikosyn  Dr Graciela Husbands   RBBB    Tubular adenoma of colon    Unstable angina (HCC)    Upper airway cough syndrome 03/26/2022   Onset ? Jan 2023   - try off Entresto  03/25/2022   - max gerd rx 08/01/2022 >>>       Ventricular bigeminy    a. Event monitor 01/2013: NSR with PVCs and occ bigeminy.   Weakness 04/09/2016   5/17 ?post-viral vs other, 3/18, 6/20, 1/23  CFS  He is very active  for his age  78/23  Post-concussion  Take Lexapro and Toprol qhs  Reduce Toprol to 1 qhs    Past Surgical History:  Procedure Laterality Date   APPENDECTOMY     CARDIAC CATHETERIZATION  2007   with patent graft anatomy atretic left internal mammary  artery to the LAD which is nonobstructive. Will restart study June 08, 2007   CARDIOVERSION N/A 03/27/2018   Procedure: CARDIOVERSION;  Surgeon: Iran Ouch, MD;  Location: [facility] ORS;  Service: Cardiovascular;  Laterality: N/A;   CARDIOVERSION N/A 01/04/2019   Procedure: CARDIOVERSION (CATH LAB);  Surgeon: Iran Ouch, MD;  Location: [facility] ORS;  Service: Cardiovascular;  Laterality: N/A;   CARDIOVERSION N/A 07/07/2019   Procedure: CARDIOVERSION;  Surgeon: Wendall Stade, MD;  Location: [facility] ENDOSCOPY;  Service: Cardiovascular;  Laterality: N/A;   CATARACT EXTRACTION W/PHACO Right 04/23/2016   Procedure: CATARACT EXTRACTION PHACO AND INTRAOCULAR LENS PLACEMENT (IOC);  Surgeon: [name], MD;  Location: [facility] ORS;  Service: Ophthalmology;  Laterality: Right;  [place] 1.09 AP% 19.3 CDE 13.38 Fluid pack lot # [id number] H   CHOLECYSTECTOMY N/A 06/05/2017   Procedure: LAPAROSCOPIC CHOLECYSTECTOMY WITH INTRAOPERATIVE CHOLANGIOGRAM ERAS PATHWAY POSSIBLE NEEDLE CORE BIOPSY OF LIVER;  Surgeon: [name], MD;  Location: [facility] OR;  Service: General;  Laterality: N/A;  ERAS PATHWAY   COLONOSCOPY     CORONARY ARTERY BYPASS GRAFT  2005   EYE SURGERY     ICD GENERATOR CHANGEOUT N/A 04/07/2017   Procedure: ICD Generator Changeout;  Surgeon: Duke Salvia, MD;  Location: [facility] INVASIVE CV LAB;  Service: Cardiovascular;  Laterality: N/A;   IMPLANTABLE CARDIOVERTER DEFIBRILLATOR IMPLANT N/A 11/19/2012   STJ single chamber ICD implanted by Dr Graciela Husbands for cardiac arrest    INGUINAL HERNIA REPAIR Bilateral 01/17/2015   Procedure: BILATERAL LAPAROSCOPIC INGUINAL HERNIA REPAIR WITH LEFT FEMORAL HERNIA REPAIR;  Surgeon: [name], MD;  Location: [facility] OR;  Service:  General;  Laterality: Bilateral;   INSERTION OF MESH Bilateral 01/17/2015   Procedure: INSERTION OF MESH;  Surgeon: [name], MD;  Location: [facility] OR;  Service: General;  Laterality: Bilateral;   IR CATHETER TUBE CHANGE  12/27/2022   LAPAROSCOPIC CHOLECYSTECTOMY  06/05/2017   LEAD INSERTION N/A 04/07/2017   Procedure: RA Lead Insertion;  Surgeon: Duke Salvia, MD;  Location: [facility] INVASIVE CV LAB;  Service: Cardiovascular;  Laterality: N/A;   LEAD REVISION/REPAIR N/A 04/08/2017   Procedure: Atrial Lead Revision/Repair;  Surgeon: Duke Salvia, MD;  Location: [facility] INVASIVE CV LAB;  Service: Cardiovascular;  Laterality: N/A;   LEFT HEART CATH AND CORONARY ANGIOGRAPHY Left 05/14/2019   Procedure: LEFT HEART CATH AND CORONARY ANGIOGRAPHY;  Surgeon: Iran Ouch, MD;  Location: [facility] INVASIVE CV  Cardiology Office Note Date:  06/30/2023  Patient ID:  Alyus, Biggs 02/14/1932, MRN [id number] PCP:  Tresa Garter, MD  Cardiologist:  Charlton Haws, MD Electrophysiologist: Sherryl Manges, MD     Chief Complaint: ICD loss of capture  History of Present Illness: [name] Christensen is a 89 y.o. male with PMH notable for ICM, PVCs, HFrEF, persis AFib, bradycardia / chronotropic incompetence, RBBB; seen today for Sherryl Manges, MD for acute visit due to RV loss of capture.   The patient was seen in [facility] ER yesterday. He was having diarrhea, weakness, dizziness. He did not have a home BP cuff, but borrowed his daughters and BP was 80/60. By the time he was eval'd in ER, BP had improved to 160/78.  Device clinic rec'd an alert this morning for potential loss of RV capture and concerned for NSVT or bigeminal PVCs.  Given the hypotension and weakness yesterday with the concern for NSVT, he was asked to come into clinic today to be evaluated.   The patient states that he has a very poor appetite, and has been supplementing significantly with Ensure, drinking 3-4 per day in the past few weeks. He has long battled constipation, except for the past few weeks, and thinks that the dietary switch to increased Ensure may have prompted the diarrhea.  Today, he denies palpitations, chest pain, chest pressure. No nausea, vomiting.   He diligently takes eliquis BID, no bleeding concerns.   Device Information: St. Jude dual chamber ICD, imp 10/2012; cardiac arrest secondary prevention MDT RA lead imp 03/2017  AAD History: Amiodarone, stopped 7/19 Tikosyn -   Past Medical History:  Diagnosis Date   Actinic keratosis 06/09/2019   L sup forehead at hairline - bx proven   AICD (automatic cardioverter/defibrillator) present 04/07/2017   Allergy    Anxiety    BPH (benign prostatic hypertrophy)    elevated PSA Dr. Lindell Noe Bx 2010   CAP (community acquired pneumonia) 10/27/2012   rx levaquin  12/14/12  - esr 51 12/31/2012 and no eos  05/2021 likely a COVID complication.  Prescribed cefdinir antibiotic, cough syrup with codeine, Medrol Dosepak  1/23 CAP --sx's - 80+% better. CXR: COPD changes with chronic basilar interstitial disease and suspect  superimposed acute infiltrate at RIGHT base.   Cardiac arrest [facility])    a. out-of-hospital arrest 11/24/2012 - EF 40-45%, patent grafts on cath, received St. Jude AICD.   Carotid disease, bilateral (HCC)    a. 0-39% by doppers.   Cataract    bil cataracts removed   Chest pain, atypical 07/21/2013   CHF (congestive heart failure) (HCC)    Chills with fever 11/24/2017   12/18 Recurrent - ?prostatitis  1/19 ?URI  6/20    Chronic calculous cholecystitis 06/05/2017   Chronic diastolic CHF (congestive heart failure) (HCC) 03/09/2018   10/19 Probable pacemaker cardiomyopathy per Dr Graciela Husbands w/a plan to   1) INCREASE cozaar (losartan) 50 mg - take 1 tablet by mouth once daily - d/c. Losartan was stopped on 05/12/19 due to high K  2) START mexiletine 150 mg- take 1 tablet by mouth TWICE daily   Coronary artery disease    a. s/p MI/CABG 2005. b. s/p cath at time of VF arrest 10/2013 - grafts patent.   Cough    Diverticulosis    Elevated LFTs    a. 10/2012 felt due to cardiac arrest - Hepatitis C Ab reactive from 10/30/2012>>Hep C RNA PCR negative 11/24/2012.    GERD (gastroesophageal

## 2023-07-02 ENCOUNTER — Ambulatory Visit: Payer: PPO | Attending: Internal Medicine

## 2023-07-02 DIAGNOSIS — I469 Cardiac arrest, cause unspecified: Secondary | ICD-10-CM

## 2023-07-05 DIAGNOSIS — J471 Bronchiectasis with (acute) exacerbation: Secondary | ICD-10-CM | POA: Diagnosis not present

## 2023-07-07 ENCOUNTER — Telehealth: Payer: Self-pay

## 2023-07-07 ENCOUNTER — Telehealth: Payer: Self-pay | Admitting: *Deleted

## 2023-07-07 NOTE — Telephone Encounter (Signed)
Spoke to patient and pts wife who assist with his medications and states she thinks patient may be taking Toprol 25mg  daily but not certain. I asked if she could check his medications and be sure he takes the Toprol 25 mg daily and also continue to check BP and record. Advised if patient notes any symptoms such as increased fatigue, dizziness, lightheadedness or other concerning symptoms to please let us know. Voiced understanding and appreciative of call.   Also advised Raynelle Fanning will be reaching out later this  to follow up.

## 2023-07-07 NOTE — Telephone Encounter (Signed)
Following alert received from [employer] received for 2 VT-1 events, longest duration 36sec, EGM's appear atrial driven, several AMS showing the same. longest duration 42sec.  All events 8/9, HF diagnostics currently abnormal.  Noted pt saw Suzann in clinic 06/30/23 for same and concern for possible VT. V zone was added for further eval. Patient called who denies any symptoms and doing well over the weekend. No signs of fluid overload per phone assessment. Wife states she is encouraging fluids since pt was recently in ER with dehydration. Advised I will forward to [facility] for further review.

## 2023-07-07 NOTE — Telephone Encounter (Signed)
Transition Care Management Unsuccessful Follow-up Telephone Call  Date of discharge and from where:  [facility] 06/29/2023  Attempts:  1st Attempt  Reason for unsuccessful TCM follow-up call:  No answer/busy

## 2023-07-08 ENCOUNTER — Telehealth: Payer: Self-pay | Admitting: *Deleted

## 2023-07-08 NOTE — Telephone Encounter (Signed)
Transition Care Management Follow-up Telephone Call Date of discharge and from where: [facility] 06/29/2023 How have you been since you were released from the hospital? Feeling much better  Any questions or concerns? No  Items Reviewed: Did the pt receive and understand the discharge instructions provided? Yes  Medications obtained and verified? No  Other? No  Any new allergies since your discharge? No  Dietary orders reviewed? No Do you have support at home? Yes    Follow up appointments reviewed:  PCP Hospital f/u appt confirmed? Yes  Scheduled to see PCP  on 07/10/2023  Are transportation arrangements needed? Yes  If their condition worsens, is the pt aware to call PCP or go to the Emergency Dept.? Yes Was the patient provided with contact information for the PCP's office or ED? Yes Was to pt encouraged to call back with questions or concerns? Yes    Alois Cliche -Berneda Rose [facility] [place], Population Health [phone] [address] [place] , [place] [address] Email : [email or web address]

## 2023-07-10 ENCOUNTER — Encounter: Payer: Self-pay | Admitting: Internal Medicine

## 2023-07-10 ENCOUNTER — Ambulatory Visit (INDEPENDENT_AMBULATORY_CARE_PROVIDER_SITE_OTHER): Payer: PPO | Admitting: Internal Medicine

## 2023-07-10 VITALS — BP 120/70 | HR 69 | Temp 98.6°F | Ht 66.0 in | Wt 132.0 lb

## 2023-07-10 DIAGNOSIS — I10 Essential (primary) hypertension: Secondary | ICD-10-CM

## 2023-07-10 DIAGNOSIS — R195 Other fecal abnormalities: Secondary | ICD-10-CM | POA: Diagnosis not present

## 2023-07-10 DIAGNOSIS — R413 Other amnesia: Secondary | ICD-10-CM | POA: Diagnosis not present

## 2023-07-10 DIAGNOSIS — R29898 Other symptoms and signs involving the musculoskeletal system: Secondary | ICD-10-CM

## 2023-07-10 NOTE — Assessment & Plan Note (Signed)
Stable

## 2023-07-10 NOTE — Assessment & Plan Note (Addendum)
Use a walker prn

## 2023-07-10 NOTE — Patient Instructions (Addendum)
Use Imodium AD 1/2 tablet once or twice a day  Hold Valsartan if BP is low

## 2023-07-10 NOTE — Assessment & Plan Note (Signed)
Hold Valsartan if BP is low

## 2023-07-10 NOTE — Progress Notes (Signed)
Subjective:  Patient ID: [name] Christensen, male    DOB: 1932-08-12  Age: 89 y.o. MRN: [id number]  CC: Follow-up (ER f/u)   HPI [name] Christensen presents for ER visit for dehydration on 06/29/23  [name] Christensen was weak. No LOC  States last week he did have several episodes of loose diarrhea. Has not had any melena no hematochezia. He denies any chest pain or pressure. Went to urgent care for evaluation was found to be hypotensive. Was sent to the ER by the time he got here his blood pressure had improved.    Outpatient Medications Prior to Visit  Medication Sig Dispense Refill   acetaminophen (TYLENOL) 500 MG tablet Take 500 mg by mouth at bedtime.     alfuzosin (UROXATRAL) 10 MG 24 hr tablet Take 10 mg by mouth every evening.      apixaban (ELIQUIS) 2.5 MG TABS tablet Take 1 tablet (2.5 mg total) by mouth 2 (two) times daily. 180 tablet 3   ascorbic acid (VITAMIN C) 250 MG tablet Take 250 mg by mouth daily.     atorvastatin (LIPITOR) 40 MG tablet Take 20 mg by mouth daily.     b complex vitamins capsule Take 1 capsule by mouth daily. 100 capsule 3   betamethasone dipropionate 0.05 % cream Apply 1 application. topically 2 (two) times daily as needed.     cinacalcet (SENSIPAR) 30 MG tablet Take 1 tablet (30 mg total) by mouth 3 (three) times a week. 25 tablet 0   cyanocobalamin (VITAMIN B12) 1000 MCG tablet Take 1 tablet (1,000 mcg total) by mouth every other day as needed. 100 tablet 3   Dextromethorphan-guaiFENesin (MUCINEX DM PO) Take 30 mg by mouth in the morning and at bedtime.     dofetilide (TIKOSYN) 250 MCG capsule TAKE 1 CAPSULE BY MOUTH TWICE A DAY 180 capsule 1   donepezil (ARICEPT) 5 MG tablet TAKE ONE TABLET BY MOUTH EVERY NIGHT AT BEDTIME 90 tablet 3   dutasteride (AVODART) 0.5 MG capsule Take 0.5 mg by mouth every evening.      escitalopram (LEXAPRO) 10 MG tablet Take 1 tablet (10 mg total) by mouth at bedtime. 90 tablet 1   famotidine (PEPCID) 20 MG tablet Take 20 mg by mouth daily.      guaiFENesin (MUCINEX) 600 MG 12 hr tablet Take 600 mg by mouth as needed for cough.     HYDROcodone bit-homatropine (HYCODAN) 5-1.5 MG/5ML syrup TAKE FIVE MLS BY MOUTH EVERY EIGHT (EIGHT) HOURS AS NEEDED FOR COUGH. 240 mL 0   metoprolol succinate (TOPROL-XL) 50 MG 24 hr tablet Take 0.5 tablets (25 mg total) by mouth at bedtime as needed. 180 tablet 1   Multiple Vitamin (MULTIVITAMIN WITH MINERALS) TABS tablet Take 1 tablet by mouth every other day.     nitroGLYCERIN (NITROSTAT) 0.4 MG SL tablet DISSOLVE 1 TABLET UNDER TONGUE AS NEEDEDFOR CHEST PAIN. MAY REPEAT 5 MINUTES APART 3 TIMES IF NEEDED 25 tablet 3   omeprazole (PRILOSEC) 20 MG capsule Take 20 mg by mouth daily.     Plecanatide 3 MG TABS Take 3 mg by mouth daily. Replaces (Linaclotide) Linzess     Polyethyl Glycol-Propyl Glycol (SYSTANE OP) Place 1 drop into both eyes daily as needed (burning eyes).      triamcinolone cream (KENALOG) 0.1 % APPLY 1 APPLICATION TOPICALLY TWO TIMES DAILY AS NEEDED AVOIDING FACE, GROIN ANDARMPITS (Patient taking differently: Apply 1 Application topically 2 (two) times daily.) 80 g 0   valsartan (DIOVAN) 40  MG tablet Take 0.5 tablets (20 mg total) by mouth every evening. 45 tablet 0   iron polysaccharides (NIFEREX) 150 MG capsule Take 1 capsule (150 mg total) by mouth daily. 30 capsule 2   No facility-administered medications prior to visit.    ROS: Review of Systems  Constitutional:  Negative for appetite change, fatigue and unexpected weight change.  HENT:  Negative for congestion, nosebleeds, sneezing, sore throat and trouble swallowing.   Eyes:  Negative for itching and visual disturbance.  Respiratory:  Negative for cough.   Cardiovascular:  Negative for chest pain, palpitations and leg swelling.  Gastrointestinal:  Negative for abdominal distention, blood in stool, diarrhea and nausea.  Genitourinary:  Negative for frequency and hematuria.  Musculoskeletal:  Negative for back pain, gait problem,  joint swelling and neck pain.  Skin:  Negative for rash.  Neurological:  Negative for dizziness, tremors, speech difficulty and weakness.  Psychiatric/Behavioral:  Negative for agitation, dysphoric mood and sleep disturbance. The patient is not nervous/anxious.     Objective:  BP 120/70 (BP Location: Left Arm, Patient Position: Sitting, Cuff Size: Large)   Pulse 69   Temp 98.6 F (37 C) (Oral)   Ht 5\' 6"  (1.676 m)   Wt 132 lb (59.9 kg)   SpO2 94%   BMI 21.31 kg/m   BP Readings from Last 3 Encounters:  07/10/23 120/70  06/30/23 120/60  06/29/23 (!) 162/78    Wt Readings from Last 3 Encounters:  07/10/23 132 lb (59.9 kg)  06/30/23 131 lb 8 oz (59.6 kg)  06/29/23 130 lb (59 kg)    Physical Exam Constitutional:      General: He is not in acute distress.    Appearance: He is well-developed.     Comments: NAD  Eyes:     Conjunctiva/sclera: Conjunctivae normal.     Pupils: Pupils are equal, round, and reactive to light.  Neck:     Thyroid: No thyromegaly.     Vascular: No JVD.  Cardiovascular:     Rate and Rhythm: Normal rate and regular rhythm.     Heart sounds: Normal heart sounds. No murmur heard.    No friction rub. No gallop.  Pulmonary:     Effort: Pulmonary effort is normal. No respiratory distress.     Breath sounds: Normal breath sounds. No wheezing or rales.  Chest:     Chest wall: No tenderness.  Abdominal:     General: Bowel sounds are normal. There is no distension.     Palpations: Abdomen is soft. There is no mass.     Tenderness: There is no abdominal tenderness. There is no guarding or rebound.  Musculoskeletal:        General: No tenderness. Normal range of motion.     Cervical back: Normal range of motion.  Lymphadenopathy:     Cervical: No cervical adenopathy.  Skin:    General: Skin is warm and dry.     Findings: No rash.  Neurological:     Mental Status: He is alert and oriented to person, place, and time.     Cranial Nerves: No cranial  nerve deficit.     Motor: No abnormal muscle tone.     Coordination: Coordination normal.     Gait: Gait normal.     Deep Tendon Reflexes: Reflexes are normal and symmetric.  Psychiatric:        Behavior: Behavior normal.        Thought Content: Thought content normal.  Judgment: Judgment normal.     Lab Results  Component Value Date   WBC 7.0 06/29/2023   HGB 10.6 (L) 06/29/2023   HCT 32.4 (L) 06/29/2023   PLT 160 06/29/2023   GLUCOSE 102 (H) 06/29/2023   CHOL 93 09/30/2022   TRIG 113.0 09/30/2022   HDL 44.80 09/30/2022   LDLCALC 26 09/30/2022   ALT 14 06/10/2023   AST 24 06/10/2023   NA 134 (L) 06/29/2023   K 4.1 06/29/2023   CL 104 06/29/2023   CREATININE 1.19 06/29/2023   BUN 20 06/29/2023   CO2 23 06/29/2023   TSH 2.05 04/01/2023   PSA 12.45 (H) 02/06/2016   INR 1.1 04/02/2017   HGBA1C 6.1 12/30/2019    CUP PACEART INCLINIC DEVICE CHECK  Result Date: 06/30/2023 ICD check in clinic. Normal device function. RV threshold slightly elevated. Sensing consistent with previous device measurements. Impedance trends stable over time. No mode switches available to review. See OV note for further discussion re: AMS events reviewed. Histogram distribution appropriate for patient and level of activity. Turned ON RV auto-threshold. Added slow VT zone. Device programmed at appropriate safety margins. Device programmed to optimize intrinsic conduction. Estimated longevity _2 years__. Pt enrolled in remote follow-up. Patient education completed including shock plan.   Assessment & Plan:   Problem List Items Addressed This Visit     Essential hypertension    Hold Valsartan if BP is low      Memory problem    Stable       Weakness of both legs    Use a walker prn        Loose stools - Primary    Chronic - 2-3 times a day Use Imodium AD 1/2 tablet once or twice a day         No orders of the defined types were placed in this encounter.     Follow-up:  Return in about 2 months (around 09/09/2023) for a follow-up visit.  [name], MD

## 2023-07-10 NOTE — Assessment & Plan Note (Signed)
Chronic - 2-3 times a day Use Imodium AD 1/2 tablet once or twice a day

## 2023-07-11 NOTE — Telephone Encounter (Signed)
Called patient, LVM to call back to discuss further and see how he was feeling.  Left call back number.

## 2023-07-12 ENCOUNTER — Other Ambulatory Visit: Payer: Self-pay

## 2023-07-12 ENCOUNTER — Other Ambulatory Visit: Payer: PPO

## 2023-07-12 ENCOUNTER — Emergency Department: Payer: PPO

## 2023-07-12 ENCOUNTER — Emergency Department
Admission: EM | Admit: 2023-07-12 | Discharge: 2023-07-12 | Disposition: A | Discharge status: Home / Self Care | Payer: PPO | Attending: Emergency Medicine | Admitting: Emergency Medicine

## 2023-07-12 DIAGNOSIS — G4700 Insomnia, unspecified: Secondary | ICD-10-CM | POA: Diagnosis not present

## 2023-07-12 DIAGNOSIS — Z950 Presence of cardiac pacemaker: Secondary | ICD-10-CM | POA: Diagnosis not present

## 2023-07-12 DIAGNOSIS — W01198A Fall on same level from slipping, tripping and stumbling with subsequent striking against other object, initial encounter: Secondary | ICD-10-CM | POA: Diagnosis not present

## 2023-07-12 DIAGNOSIS — I10 Essential (primary) hypertension: Secondary | ICD-10-CM | POA: Diagnosis not present

## 2023-07-12 DIAGNOSIS — W19XXXA Unspecified fall, initial encounter: Secondary | ICD-10-CM | POA: Diagnosis not present

## 2023-07-12 DIAGNOSIS — M542 Cervicalgia: Secondary | ICD-10-CM | POA: Diagnosis not present

## 2023-07-12 DIAGNOSIS — Y92031 Bathroom in apartment as the place of occurrence of the external cause: Secondary | ICD-10-CM | POA: Insufficient documentation

## 2023-07-12 DIAGNOSIS — M546 Pain in thoracic spine: Secondary | ICD-10-CM | POA: Diagnosis not present

## 2023-07-12 DIAGNOSIS — Y92009 Unspecified place in unspecified non-institutional (private) residence as the place of occurrence of the external cause: Secondary | ICD-10-CM

## 2023-07-12 DIAGNOSIS — S199XXA Unspecified injury of neck, initial encounter: Secondary | ICD-10-CM | POA: Diagnosis not present

## 2023-07-12 DIAGNOSIS — R197 Diarrhea, unspecified: Secondary | ICD-10-CM | POA: Diagnosis not present

## 2023-07-12 DIAGNOSIS — S0990XA Unspecified injury of head, initial encounter: Secondary | ICD-10-CM | POA: Insufficient documentation

## 2023-07-12 DIAGNOSIS — R531 Weakness: Secondary | ICD-10-CM | POA: Diagnosis not present

## 2023-07-12 DIAGNOSIS — M545 Low back pain, unspecified: Secondary | ICD-10-CM | POA: Insufficient documentation

## 2023-07-12 DIAGNOSIS — M47819 Spondylosis without myelopathy or radiculopathy, site unspecified: Secondary | ICD-10-CM | POA: Diagnosis not present

## 2023-07-12 DIAGNOSIS — S22019A Unspecified fracture of first thoracic vertebra, initial encounter for closed fracture: Secondary | ICD-10-CM | POA: Diagnosis not present

## 2023-07-12 DIAGNOSIS — M40209 Unspecified kyphosis, site unspecified: Secondary | ICD-10-CM | POA: Diagnosis not present

## 2023-07-12 DIAGNOSIS — I2510 Atherosclerotic heart disease of native coronary artery without angina pectoris: Secondary | ICD-10-CM | POA: Insufficient documentation

## 2023-07-12 DIAGNOSIS — M25512 Pain in left shoulder: Secondary | ICD-10-CM | POA: Insufficient documentation

## 2023-07-12 DIAGNOSIS — S22029A Unspecified fracture of second thoracic vertebra, initial encounter for closed fracture: Secondary | ICD-10-CM | POA: Diagnosis not present

## 2023-07-12 DIAGNOSIS — M19012 Primary osteoarthritis, left shoulder: Secondary | ICD-10-CM | POA: Diagnosis not present

## 2023-07-12 DIAGNOSIS — M5136 Other intervertebral disc degeneration, lumbar region: Secondary | ICD-10-CM | POA: Diagnosis not present

## 2023-07-12 LAB — CBC WITH DIFFERENTIAL/PLATELET
Abs Immature Granulocytes: 0.07 10*3/uL (ref 0.00–0.07)
Basophils Absolute: 0 10*3/uL (ref 0.0–0.1)
Basophils Relative: 0 %
Eosinophils Absolute: 0.1 10*3/uL (ref 0.0–0.5)
Eosinophils Relative: 1 %
HCT: 32.1 % — ABNORMAL LOW (ref 39.0–52.0)
Hemoglobin: 10.3 g/dL — ABNORMAL LOW (ref 13.0–17.0)
Immature Granulocytes: 1 %
Lymphocytes Relative: 8 %
Lymphs Abs: 0.9 10*3/uL (ref 0.7–4.0)
MCH: 31.2 pg (ref 26.0–34.0)
MCHC: 32.1 g/dL (ref 30.0–36.0)
MCV: 97.3 fL (ref 80.0–100.0)
Monocytes Absolute: 0.4 10*3/uL (ref 0.1–1.0)
Monocytes Relative: 3 %
Neutro Abs: 10.4 10*3/uL — ABNORMAL HIGH (ref 1.7–7.7)
Neutrophils Relative %: 87 %
Platelets: 138 10*3/uL — ABNORMAL LOW (ref 150–400)
RBC: 3.3 MIL/uL — ABNORMAL LOW (ref 4.22–5.81)
RDW: 12.8 % (ref 11.5–15.5)
WBC: 11.9 10*3/uL — ABNORMAL HIGH (ref 4.0–10.5)
nRBC: 0 % (ref 0.0–0.2)

## 2023-07-12 LAB — COMPREHENSIVE METABOLIC PANEL
ALT: 16 U/L (ref 0–44)
AST: 25 U/L (ref 15–41)
Albumin: 3.3 g/dL — ABNORMAL LOW (ref 3.5–5.0)
Alkaline Phosphatase: 72 U/L (ref 38–126)
Anion gap: 5 (ref 5–15)
BUN: 24 mg/dL — ABNORMAL HIGH (ref 8–23)
CO2: 24 mmol/L (ref 22–32)
Calcium: 10.2 mg/dL (ref 8.9–10.3)
Chloride: 105 mmol/L (ref 98–111)
Creatinine, Ser: 1.22 mg/dL (ref 0.61–1.24)
GFR, Estimated: 57 mL/min — ABNORMAL LOW (ref >60)
Glucose, Bld: 121 mg/dL — ABNORMAL HIGH (ref 70–99)
Potassium: 3.9 mmol/L (ref 3.5–5.1)
Sodium: 134 mmol/L — ABNORMAL LOW (ref 135–145)
Total Bilirubin: 0.8 mg/dL (ref 0.3–1.2)
Total Protein: 7.4 g/dL (ref 6.5–8.1)

## 2023-07-12 LAB — CK: Total CK: 89 U/L (ref 49–397)

## 2023-07-12 LAB — MAGNESIUM: Magnesium: 1.8 mg/dL (ref 1.7–2.4)

## 2023-07-12 MED ORDER — SODIUM CHLORIDE 0.9 % IV BOLUS: 1000.0000 mL | Freq: Once | INTRAVENOUS | Status: DC

## 2023-07-12 MED ORDER — MORPHINE SULFATE (PF) 2 MG/ML IV SOLN
2.0000 mg | Freq: Once | INTRAVENOUS | Status: AC
  Administered 2023-07-12: 2 mg via INTRAVENOUS
  Filled 2023-07-12: qty 1

## 2023-07-12 NOTE — ED Triage Notes (Signed)
Pt in via EMS from home. Hasn't been sleeping good and took x2 sleeping pills he's never taken before. At 0330 had diarrhea for about an hour. Had a mechanical fall in hallway shortly after that. Pt c/o upper body pain, ribs, back, and shoulders. did hit head. no LOC, small hemotoma, on blood thinner but unsure which one. Has pacemaker. Had of fluid with EMS. Pt A&O x4.  Vitals per EMS: Paced rythm - HR at 80 BS-118 T-97.3 Bp- 113/63 O2-98%

## 2023-07-12 NOTE — ED Provider Notes (Signed)
[facility] Provider Note    Event Date/Time   First MD Initiated Contact with Patient 07/12/23 0719     (approximate)   History   Chief Complaint: Fall   HPI  [name] Christensen is a 89 y.o. male with a history of hypertension, CAD, insomnia who comes to the ED complaining of fall at home.  Reports he was having insomnia and took medication to help him sleep last night, woke up at about 3:00 in the morning with an urge to have a bowel movement.  As he was walking to the bathroom with his walker, he fell against a door frame.  Hit his head.  Complains of pain from neck to lower back along the spine.  No loss of consciousness.  No paresthesias or motor weakness.  He got himself up, went to the bathroom and had copious diarrhea.  Denies chest pain or abdominal pain.  No black or bloody stool.  No vomiting  Wife was arrived to bedside notes that the diarrhea is chronic and they are following up with his doctor about this.  He has had previous falls under similar circumstances.  Due to the patient's insomnia she has had to move to an upstairs bedroom for sleeping at night, so patient is sleeping alone.  He has a cell phone with him but did not call her.  Additionally he prefers a bathroom that is on the opposite side of the house from the master bathroom attached to the bedroom he sleeping in, so he was trying to walk all the way across the house in the night with his walker when he fell.     Physical Exam   Triage Vital Signs: ED Triage Vitals  Encounter Vitals Group     BP 07/12/23 0725 100/69     Systolic BP Percentile --      Diastolic BP Percentile --      Pulse Rate 07/12/23 0731 70     Resp 07/12/23 0731 15     Temp 07/12/23 0725 97.7 F (36.5 C)     Temp Source 07/12/23 0725 Oral     SpO2 07/12/23 0731 97 %     Weight 07/12/23 0724 135 lb 3.2 oz (61.3 kg)     Height 07/12/23 0724 5\' 6"  (1.676 m)     Head Circumference --      Peak Flow --      Pain  Score 07/12/23 0724 10     Pain Loc --      Pain Education --      Exclude from Growth Chart --     Most recent vital signs: Vitals:   07/12/23 0731 07/12/23 1030  BP: 117/61 (!) 94/55  Pulse: 70 77  Resp: 15 20  Temp:    SpO2: 97% 96%    General: Awake, no distress.  CV:  Good peripheral perfusion.  Regular rate rhythm Resp:  Normal effort.  Clear to auscultation bilaterally Abd:  No distention.  Soft nontender Other:  Dry oral mucosa   ED Results / Procedures / Treatments   Labs (all labs ordered are listed, but only abnormal results are displayed) Labs Reviewed  COMPREHENSIVE METABOLIC PANEL - Abnormal; Notable for the following components:      Result Value   Sodium 134 (*)    Glucose, Bld 121 (*)    BUN 24 (*)    Albumin 3.3 (*)    GFR, Estimated 57 (*)    All other  components within normal limits  CBC WITH DIFFERENTIAL/PLATELET - Abnormal; Notable for the following components:   WBC 11.9 (*)    RBC 3.30 (*)    Hemoglobin 10.3 (*)    HCT 32.1 (*)    Platelets 138 (*)    Neutro Abs 10.4 (*)    All other components within normal limits  CK  MAGNESIUM  URINALYSIS, W/ REFLEX TO CULTURE (INFECTION SUSPECTED)     EKG Interpreted by me Sinus rhythm rate of 70.  Right axis, right bundle branch block.  No acute ischemic changes.   RADIOLOGY CT head interpreted by me, negative for intracranial hemorrhage.  Radiology report reviewed.  CT cervical spine, thoracic spine, lumbar spine all unremarkable.  X-ray left shoulder negative   PROCEDURES:  Procedures   MEDICATIONS ORDERED IN ED: Medications  sodium chloride 0.9 % bolus 1,000 mL (1,000 mLs Intravenous Bolus 07/12/23 0748)  morphine (PF) 2 MG/ML injection 2 mg (2 mg Intravenous Given 07/12/23 0746)     IMPRESSION / MDM / ASSESSMENT AND PLAN / ED COURSE  I reviewed the triage vital signs and the nursing notes.  DDx: Intracranial hemorrhage, C-spine fracture, spinal compression fracture, AKI,  dehydration, electrolyte abnormality, anemia, proximal humerus fracture  Patient's presentation is most consistent with acute presentation with potential threat to life or bodily function.  Patient presents with fall at home in the setting of diarrhea and melatonin use.  Vital signs unremarkable.  Exam is overall reassuring.  Labs unremarkable.  Imaging for trauma evaluation is negative.  Discussed results with patient and his spouse, comfortable with discharge home and continued outpatient management.       FINAL CLINICAL IMPRESSION(S) / ED DIAGNOSES   Final diagnoses:  Fall in home, initial encounter  Diarrhea, unspecified type     Rx / DC Orders   ED Discharge Orders     None        Note:  This document was prepared using Dragon voice recognition software and may include unintentional dictation errors.   Sharman Cheek, MD 07/12/23 1146

## 2023-07-14 ENCOUNTER — Telehealth: Payer: Self-pay | Admitting: Internal Medicine

## 2023-07-14 DIAGNOSIS — J449 Chronic obstructive pulmonary disease, unspecified: Secondary | ICD-10-CM | POA: Diagnosis not present

## 2023-07-14 DIAGNOSIS — I130 Hypertensive heart and chronic kidney disease with heart failure and stage 1 through stage 4 chronic kidney disease, or unspecified chronic kidney disease: Secondary | ICD-10-CM | POA: Diagnosis not present

## 2023-07-14 DIAGNOSIS — I5040 Unspecified combined systolic (congestive) and diastolic (congestive) heart failure: Secondary | ICD-10-CM | POA: Diagnosis not present

## 2023-07-14 DIAGNOSIS — I4819 Other persistent atrial fibrillation: Secondary | ICD-10-CM | POA: Diagnosis not present

## 2023-07-14 DIAGNOSIS — N183 Chronic kidney disease, stage 3 unspecified: Secondary | ICD-10-CM | POA: Diagnosis not present

## 2023-07-14 DIAGNOSIS — K7460 Unspecified cirrhosis of liver: Secondary | ICD-10-CM | POA: Diagnosis not present

## 2023-07-14 DIAGNOSIS — I719 Aortic aneurysm of unspecified site, without rupture: Secondary | ICD-10-CM | POA: Diagnosis not present

## 2023-07-14 DIAGNOSIS — G40909 Epilepsy, unspecified, not intractable, without status epilepticus: Secondary | ICD-10-CM | POA: Diagnosis not present

## 2023-07-14 DIAGNOSIS — F0390 Unspecified dementia without behavioral disturbance: Secondary | ICD-10-CM | POA: Diagnosis not present

## 2023-07-14 NOTE — Telephone Encounter (Signed)
Called pt he states he went to the ER over the weekend. He is wanting MD response on his labs ,,./lmb

## 2023-07-14 NOTE — Telephone Encounter (Signed)
Patient was seen 07/10/23 for an ER follow up. Patient had another fall over the weekend and was told to follow up with PCP. They would like to know if patient needs to come back in since he was just seen last week. Best callback is [phone].

## 2023-07-14 NOTE — Telephone Encounter (Signed)
Called patient, spoke with wife. Advised that they did have a meeting and did agree he should use the restroom closer to the bedroom, and he will start to do this.   Also verbalized understanding to DC valsartan for now, they will continue to monitor vitals at home and will call us with any questions/concerns.   Thanks!

## 2023-07-14 NOTE — Telephone Encounter (Signed)
I called patient today to check in, patient and wife are on the phone. Stating that over the weekend he had a fall at home and EMS was called, and he was taken to the ED (notes in epic, along with scans and lab work results). Patient wife states they recently seen PCP (notes in epic) he advised to hold Valsartan if BP was low however they were unsure of what this meant and what numbers to be on the look out for. He has not had his valsartan since that visit on 08/15. He does take his Metoprolol 0.5 tablet nightly and states he does not have any side effects from this. He did have a home health nurse come in today and check his blood pressure. Sitting it was 142/64, standing was 128/60 ( unfortunately they did not have HR readings to provide) however, wife states she checked his HR this morning and sitting it was 77, and standing it was 91. He states that he is not having the lightheadedness as much now, however states he is getting over his fall and is sore feeling, so he is currently in the bed at this time.   Patient wife advised they would like someone to review the blood work from the hospital, as well as when and if to take the Valsartan. Next upcoming visit is 09/17.   Please advise. Thanks!

## 2023-07-17 NOTE — Progress Notes (Signed)
Remote ICD transmission.   

## 2023-07-18 ENCOUNTER — Telehealth: Payer: Self-pay

## 2023-07-18 NOTE — Telephone Encounter (Signed)
Transition Care Management Follow-up Telephone Call Date of discharge and from where: 07/12/2023 [facility] How have you been since you were released from the hospital? Patient stated he is feeling better, but his BP has been up and down he is monitoring it. Any questions or concerns? No  Items Reviewed: Did the pt receive and understand the discharge instructions provided? Yes  Medications obtained and verified?  No medication prescribed. Other? No  Any new allergies since your discharge? No  Dietary orders reviewed? Yes Do you have support at home? Yes   Follow up appointments reviewed:  PCP Hospital f/u appt confirmed?  Patient contacted PCP 07/14/2023 advised to keep regular appointment if well, if not well make appointment sooner.  Scheduled to see Georgina Quint. Plotniknov, MD on 09/17/2023 @ [facility]. Specialist Hospital f/u appt confirmed? No  Scheduled to see  on  @ . Are transportation arrangements needed? No  If their condition worsens, is the pt aware to call PCP or go to the Emergency Dept.? Yes Was the patient provided with contact information for the PCP's office or ED? Yes Was to pt encouraged to call back with questions or concerns? Yes  Ella Guillotte [facility]  [facility] Population Health Community Resource Care Guide   [email or web address]  ?? [phone]   Website: [email or web address]  [email or web address]

## 2023-07-18 NOTE — Telephone Encounter (Signed)
Continue to keep his regular appointment if he is doing well.  If not well, please see myself or any other provider soon.  Thank you

## 2023-07-18 NOTE — Telephone Encounter (Signed)
Notified pt w/ MD response../l b 

## 2023-07-19 ENCOUNTER — Other Ambulatory Visit: Payer: Self-pay | Admitting: Internal Medicine

## 2023-08-05 DIAGNOSIS — J471 Bronchiectasis with (acute) exacerbation: Secondary | ICD-10-CM | POA: Diagnosis not present

## 2023-08-11 NOTE — Progress Notes (Unsigned)
Cardiology Office Note Date:  08/12/2023  Patient ID:  [name], Christensen [birth year], MRN [id number] PCP:  [name] Garter, MD  Cardiologist:  [name] Haws, MD Electrophysiologist: [name] Manges, MD    Chief Complaint: 6 week follow-up   History of Present Illness: [name] Christensen is a 89 y.o. male with PMH notable for ICM, PVCs, HFrEF, persis AFib, bradycardia / chronotropic incompetence, RBBB; seen today for [name] Manges, MD for routine EP follow-up.   I saw the patient 06/30/2023 after ER visit for diarrhea, found to be hypotensive. Also concerned for loss of RV capture with slow, NSVT vs bigeminal PVCs. During visit, he was orthostatic so valsartan decreased. Device clinic alerted soon after appt for VT-1 episode that appeared atrially driven. PRN toprol was changed to nightly. Patient had fall a few days later, and valsartan stopped. Telephone calls after this by clinic staff to patient and wife indicate he was doing well with medication adjustments.   On follow-up today, he is feeling better than previously. Is no longer having diarrhea. He has had no further falls. Denies palpitations, chest pain, chest pressure. His appetite has improved. He has resumed working out at [employer] doing walking, stationary bike, and low weight lifting. Goes a couple times a week.   He diligently takes eliquis BID, no bleeding concerns.  He is not sure whether he stopped his valsartan or continued taking it. He is pretty sure that he is taking 25mg  toprol nightly.   Device Information: St. Jude dual chamber ICD, imp 10/2012; cardiac arrest secondary prevention MDT RA lead imp 03/2017  AAD History: Amiodarone, stopped 7/19 Tikosyn -   Past Medical History:  Diagnosis Date   Actinic keratosis 06/09/2019   L sup forehead at hairline - bx proven   AICD (automatic cardioverter/defibrillator) present 04/07/2017   Allergy    Anxiety    BPH (benign prostatic hypertrophy)    elevated PSA Dr. Lindell Christensen  Bx 2010   CAP (community acquired pneumonia) 11/19/2012   rx levaquin 12/14/12  - esr 51 12/31/2012 and no eos  05/2021 likely a COVID complication.  Prescribed cefdinir antibiotic, cough syrup with codeine, Medrol Dosepak  1/23 CAP --sx's - 80+% better. CXR: COPD changes with chronic basilar interstitial disease and suspect  superimposed acute infiltrate at RIGHT base.   Cardiac arrest [facility])    a. out-of-hospital arrest 11/15/2012 - EF 40-45%, patent grafts on cath, received St. Jude AICD.   Carotid disease, bilateral (HCC)    a. 0-39% by doppers.   Cataract    bil cataracts removed   Chest pain, atypical 07/21/2013   CHF (congestive heart failure) (HCC)    Chills with fever 11/24/2017   12/18 Recurrent - ?prostatitis  1/19 ?URI  6/20    Chronic calculous cholecystitis 06/05/2017   Chronic diastolic CHF (congestive heart failure) (HCC) 03/09/2018   10/19 Probable pacemaker cardiomyopathy per Dr [name] Christensen w/a plan to   1) INCREASE cozaar (losartan) 50 mg - take 1 tablet by mouth once daily - d/c. Losartan was stopped on 05/12/19 due to high K  2) START mexiletine 150 mg- take 1 tablet by mouth TWICE daily   Coronary artery disease    a. s/p MI/CABG 2005. b. s/p cath at time of VF arrest 10/2013 - grafts patent.   Cough    Diverticulosis    Elevated LFTs    a. 10/2012 felt due to cardiac arrest - Hepatitis C Ab reactive from 11/14/2012>>Hep C RNA PCR negative 10/27/2012.  GERD (gastroesophageal reflux disease)    Hyperlipidemia    Hypertension    Internal hemorrhoids    Myocardial infarction (HCC)    2005 - CABG x 5 2005   Paroxysmal atrial fibrillation (HCC)    Continue on Eliquis, Toprol, Tikosyn  Dr [name] Christensen   RBBB    Tubular adenoma of colon    Unstable angina (HCC)    Upper airway cough syndrome 03/26/2022   Onset ? Jan 2023   - try off Entresto  03/25/2022   - max gerd rx 08/01/2022 >>>       Ventricular bigeminy    a. Event monitor 01/2013: NSR with PVCs and occ bigeminy.   Weakness 04/09/2016    5/17 ?post-viral vs other, 3/18, 6/20, 1/23  CFS  He is very active for his age  82/23  Post-concussion  Take Lexapro and Toprol qhs  Reduce Toprol to 1 qhs    Past Surgical History:  Procedure Laterality Date   APPENDECTOMY     CARDIAC CATHETERIZATION  2007   with patent graft anatomy atretic left internal mammary  artery to the LAD which is nonobstructive. Will restart study June 08, 2007   CARDIOVERSION N/A 03/27/2018   Procedure: CARDIOVERSION;  Surgeon: [name] Ouch, MD;  Location: [facility] ORS;  Service: Cardiovascular;  Laterality: N/A;   CARDIOVERSION N/A 01/04/2019   Procedure: CARDIOVERSION (CATH LAB);  Surgeon: [name] Ouch, MD;  Location: [facility] ORS;  Service: Cardiovascular;  Laterality: N/A;   CARDIOVERSION N/A 07/07/2019   Procedure: CARDIOVERSION;  Surgeon: Wendall Stade, MD;  Location: [facility] ENDOSCOPY;  Service: Cardiovascular;  Laterality: N/A;   CATARACT EXTRACTION W/PHACO Right 04/23/2016   Procedure: CATARACT EXTRACTION PHACO AND INTRAOCULAR LENS PLACEMENT (IOC);  Surgeon: [name] Manila, MD;  Location: [facility] ORS;  Service: Ophthalmology;  Laterality: Right;  [place] 1.09 AP% 19.3 CDE 13.38 Fluid pack lot # [id number]   CHOLECYSTECTOMY N/A 06/05/2017   Procedure: LAPAROSCOPIC CHOLECYSTECTOMY WITH INTRAOPERATIVE CHOLANGIOGRAM ERAS PATHWAY POSSIBLE NEEDLE CORE BIOPSY OF LIVER;  Surgeon: [name] Soda, MD;  Location: [facility] OR;  Service: General;  Laterality: N/A;  ERAS PATHWAY   COLONOSCOPY     CORONARY ARTERY BYPASS GRAFT  2005   EYE SURGERY     ICD GENERATOR CHANGEOUT N/A 04/07/2017   Procedure: ICD Generator Changeout;  Surgeon: [name] Salvia, MD;  Location: [facility] INVASIVE CV LAB;  Service: Cardiovascular;  Laterality: N/A;   IMPLANTABLE CARDIOVERTER DEFIBRILLATOR IMPLANT N/A 10/28/2012   STJ single chamber ICD implanted by Dr [name] Christensen for cardiac arrest    INGUINAL HERNIA REPAIR Bilateral 01/17/2015   Procedure: BILATERAL LAPAROSCOPIC INGUINAL HERNIA REPAIR WITH LEFT FEMORAL HERNIA  REPAIR;  Surgeon: [name] Soda, MD;  Location: [facility] OR;  Service: General;  Laterality: Bilateral;   INSERTION OF MESH Bilateral 01/17/2015   Procedure: INSERTION OF MESH;  Surgeon: [name] Soda, MD;  Location: [facility] OR;  Service: General;  Laterality: Bilateral;   IR CATHETER TUBE CHANGE  12/27/2022   LAPAROSCOPIC CHOLECYSTECTOMY  06/05/2017   LEAD INSERTION N/A 04/07/2017   Procedure: RA Lead Insertion;  Surgeon: [name] Salvia, MD;  Location: [facility] INVASIVE CV LAB;  Service: Cardiovascular;  Laterality: N/A;   LEAD REVISION/REPAIR N/A 04/08/2017   Procedure: Atrial Lead Revision/Repair;  Surgeon: [name] Salvia, MD;  Location: [facility] INVASIVE CV LAB;  Service: Cardiovascular;  Laterality: N/A;   LEFT HEART CATH AND CORONARY ANGIOGRAPHY Left 05/14/2019   Procedure: LEFT HEART CATH AND CORONARY ANGIOGRAPHY;  Surgeon: [name] Ouch, MD;  Location: [facility]  INVASIVE CV LAB;  Service: Cardiovascular;  Laterality: Left;   LEFT HEART CATHETERIZATION WITH CORONARY/GRAFT ANGIOGRAM N/A 10/27/2012   Procedure: LEFT HEART CATHETERIZATION WITH Isabel Caprice;  Surgeon: [name], MD;  Location: [facility] CATH LAB;  Service: Cardiovascular;  Laterality: N/A;   LIVER BIOPSY N/A 06/05/2017   Procedure: SINGLE SITE LIVER BIOPSY ERAS PATHWAY;  Surgeon: [name] Soda, MD;  Location: [facility] OR;  Service: General;  Laterality: N/A;  ERAS PATHWAY   LUMBAR FUSION  09/2007    Current Outpatient Medications  Medication Instructions   acetaminophen (TYLENOL) 500 mg, Oral, Daily at bedtime   alfuzosin (UROXATRAL) 10 mg, Oral, Every evening   apixaban (ELIQUIS) 2.5 mg, Oral, 2 times daily   ascorbic acid (VITAMIN C) 250 mg, Oral, Daily   atorvastatin (LIPITOR) 20 mg, Oral, Daily   b complex vitamins capsule 1 capsule, Oral, Daily   betamethasone dipropionate 0.05 % cream 1 application , Topical, 2 times daily PRN   cinacalcet (SENSIPAR) 30 mg, Oral, 3 times weekly   cyanocobalamin (VITAMIN B12) 1,000 mcg, Oral, Every 48 hours  PRN   Dextromethorphan-guaiFENesin (MUCINEX DM PO) 30 mg, Oral, 2 times daily   dofetilide (TIKOSYN) 250 mcg, Oral, 2 times daily   donepezil (ARICEPT) 5 mg, Oral, Daily at bedtime   dutasteride (AVODART) 0.5 mg, Oral, Every evening   escitalopram (LEXAPRO) 10 mg, Oral, Daily at bedtime   famotidine (PEPCID) 20 mg, Oral, Daily   guaiFENesin (MUCINEX) 600 mg, Oral, As needed   HYDROcodone bit-homatropine (HYCODAN) 5-1.5 MG/5ML syrup TAKE FIVE MLS BY MOUTH EVERY EIGHT (EIGHT) HOURS AS NEEDED FOR COUGH.   iron polysaccharides (NIFEREX) 150 mg, Oral, Daily   metoprolol succinate (TOPROL-XL) 25 mg, Oral, At bedtime PRN   Multiple Vitamin (MULTIVITAMIN WITH MINERALS) TABS tablet 1 tablet, Oral, Every other day   nitroGLYCERIN (NITROSTAT) 0.4 MG SL tablet DISSOLVE 1 TABLET UNDER TONGUE AS NEEDEDFOR CHEST PAIN. MAY REPEAT 5 MINUTES APART 3 TIMES IF NEEDED   omeprazole (PRILOSEC) 20 mg, Oral, Daily   Plecanatide 3 mg, Oral, Daily, Replaces (Linaclotide) Linzess   Polyethyl Glycol-Propyl Glycol (SYSTANE OP) 1 drop, Both Eyes, Daily PRN   triamcinolone cream (KENALOG) 0.1 % APPLY 1 APPLICATION TOPICALLY TWO TIMES DAILY AS NEEDED AVOIDING FACE, GROIN ANDARMPITS   valsartan (DIOVAN) 20 mg, Oral, Every evening    Social History:  The patient  reports that he has never smoked. He has been exposed to tobacco smoke. He has never used smokeless tobacco. He reports that he does not drink alcohol and does not use drugs.   Family History:   The patient's family history includes Coronary artery disease in his mother; Heart attack in his brother.  ROS:  Please see the history of present illness. All other systems are reviewed and otherwise negative.   PHYSICAL EXAM:  VS:  BP 120/60 (BP Location: Left Arm, Patient Position: Sitting, Cuff Size: Normal)   Pulse 70   Ht 5\' 6"  (1.676 m)   Wt 133 lb 4 oz (60.4 kg)   SpO2 97%   BMI 21.51 kg/m  BMI: Body mass index is 21.51 kg/m.   GEN- The patient is well  appearing, alert and oriented x 3 today.   Lungs- Clear to ausculation bilaterally, normal work of breathing.  Heart- Regular rate and rhythm, no murmurs, rubs or gallops Extremities- No peripheral edema, warm, dry Skin-  device pocket well-healed, no tethering   Device interrogation done today and reviewed by myself:  Battery 2.1 years Lead  thresholds, impedence, sensing stable  Frequent tachycardia episodes falling into VT-1 and non-sustained - all with similar morphology to intrinsic. Unable to see the start of the rhythm and patient has 1st deg HB, so all appear to be SVT No changes made today  EKG is ordered. Personal review of EKG from  06/29/2023  shows:   EKG Interpretation Date/Time:  Tuesday August 12 2023 09:43:56 EDT Ventricular Rate:  70 PR Interval:  348 QRS Duration:  176 QT Interval:  458 QTC Calculation: 494 R Axis:   258  Text Interpretation: Atrial-paced rhythm with prolonged AV conduction Right bundle branch block Marked T wave abnormality, consider lateral ischemia Confirmed by Sherie Don [phone]) on 08/12/2023 9:46:30 AM    06/29/2023:  A-pace at 70bpm; 1st deg HB, RBBB PR QRS QTc stable when corrected for RBBB   Recent Labs: 04/01/2023: TSH 2.05 07/12/2023: ALT 16; BUN 24; Creatinine, Ser 1.22; Hemoglobin 10.3; Magnesium 1.8; Platelets 138; Potassium 3.9; Sodium 134  09/30/2022: Cholesterol 93; HDL 44.80; LDL Cholesterol 26; Total CHOL/HDL Ratio 2; Triglycerides 113.0; VLDL 22.6   CrCl cannot be calculated (Patient's most recent lab result is older than the maximum 21 days allowed.).   Wt Readings from Last 3 Encounters:  08/12/23 133 lb 4 oz (60.4 kg)  07/12/23 135 lb 3.2 oz (61.3 kg)  07/10/23 132 lb (59.9 kg)     Additional studies reviewed include: Previous EP, cardiology notes.   TTE, 08/23/2021  1. Left ventricular ejection fraction, by estimation, is 40 to 45%. The left ventricle has mildly decreased function. The left ventricle  demonstrates global hypokinesis. Left ventricular diastolic parameters are consistent with Grade I diastolic dysfunction (impaired relaxation).   2. Right ventricular systolic function is normal. The right ventricular size is normal. There is normal pulmonary artery systolic pressure. The estimated right ventricular systolic pressure is 23.1 mmHg.   3. The mitral valve is normal in structure. Mild mitral valve regurgitation. No evidence of mitral stenosis.   4. The aortic valve was not well visualized. Aortic valve regurgitation is not visualized. Mild to moderate aortic valve sclerosis/calcification is present, without any evidence of aortic stenosis.   5. The inferior vena cava is normal in size with greater than 50% respiratory variability, suggesting right atrial pressure of 3 mmHg.   6. Frequent PVCs noted.   7. Challenging images   Comparison(s): LVEF 40-45%.    ASSESSMENT AND PLAN:  #) persis Afib #) high risk medication - tikosyn Overall low AF burden by device at <1% QTc stable on today's EKG Continue tikosyn BID Continue 25mg  toprol nightly -- I've asked patient to compare medication list to pill bottle to ensure he is taking this  #) Hypercoag d/t persis afib CHA2DS2-VASc Score = 5 [CHF History: 1, HTN History: 1, Diabetes History: 0, Stroke History: 0, Vascular Disease History: 1, Age Score: 2, Gender Score: 0].  Therefore, the patient's annual risk of stroke is 7.2 %. Stroke ppx - 2.5mg  eliquis BID, dose appropriately reduced for weight < 60kg, age > 35 No bleeding concerns  #) St. Jude dual chamber ICD in situ Device functioning well, see paceart for details   #) HTN #) orthostatic hypotension Normotensive in office Continue to hold valsartan - I've asked him to make sure this medication is not in his pill organizer      Current medicines are reviewed at length with the patient today.   The patient does not have concerns regarding his medicines.  The  following changes were made today:   none  Labs/ tests ordered today include:  Orders Placed This Encounter  Procedures   EKG 12-Lead     Disposition: Follow up with Dr. Graciela Christensen in in 3 months   Signed, Sherie Don, NP  08/12/23  9:46 AM  Electrophysiology [facility]

## 2023-08-12 ENCOUNTER — Ambulatory Visit: Payer: PPO | Attending: Cardiology | Admitting: Cardiology

## 2023-08-12 ENCOUNTER — Other Ambulatory Visit: Payer: Self-pay | Admitting: Internal Medicine

## 2023-08-12 ENCOUNTER — Encounter: Payer: Self-pay | Admitting: Cardiology

## 2023-08-12 VITALS — BP 120/60 | HR 70 | Ht 66.0 in | Wt 133.2 lb

## 2023-08-12 DIAGNOSIS — I951 Orthostatic hypotension: Secondary | ICD-10-CM

## 2023-08-12 DIAGNOSIS — Z79899 Other long term (current) drug therapy: Secondary | ICD-10-CM

## 2023-08-12 DIAGNOSIS — Z95810 Presence of automatic (implantable) cardiac defibrillator: Secondary | ICD-10-CM

## 2023-08-12 DIAGNOSIS — I4819 Other persistent atrial fibrillation: Secondary | ICD-10-CM

## 2023-08-12 DIAGNOSIS — I469 Cardiac arrest, cause unspecified: Secondary | ICD-10-CM | POA: Diagnosis not present

## 2023-08-12 DIAGNOSIS — I10 Essential (primary) hypertension: Secondary | ICD-10-CM

## 2023-08-12 DIAGNOSIS — I493 Ventricular premature depolarization: Secondary | ICD-10-CM | POA: Diagnosis not present

## 2023-08-12 LAB — CUP PACEART INCLINIC DEVICE CHECK
Battery Remaining Longevity: 26 mo
Brady Statistic RA Percent Paced: 81 %
Brady Statistic RV Percent Paced: 2.9 %
Date Time Interrogation Session: 20240917163555
HighPow Impedance: 65.25 Ohm
Implantable Lead Connection Status: 753985
Implantable Lead Connection Status: 753985
Implantable Lead Implant Date: 20131218
Implantable Lead Implant Date: 20180514
Implantable Lead Location: 753859
Implantable Lead Location: 753860
Implantable Lead Model: 5076
Implantable Pulse Generator Implant Date: 20180514
Lead Channel Impedance Value: 387.5 Ohm
Lead Channel Impedance Value: 487.5 Ohm
Lead Channel Pacing Threshold Amplitude: 0.5 V
Lead Channel Pacing Threshold Amplitude: 0.5 V
Lead Channel Pacing Threshold Amplitude: 1.25 V
Lead Channel Pacing Threshold Amplitude: 1.25 V
Lead Channel Pacing Threshold Pulse Width: 0.5 ms
Lead Channel Pacing Threshold Pulse Width: 0.5 ms
Lead Channel Pacing Threshold Pulse Width: 0.5 ms
Lead Channel Pacing Threshold Pulse Width: 0.5 ms
Lead Channel Sensing Intrinsic Amplitude: 3.4 mV
Lead Channel Sensing Intrinsic Amplitude: 5.4 mV
Lead Channel Setting Pacing Amplitude: 1.5 V
Lead Channel Setting Pacing Amplitude: 2 V
Lead Channel Setting Pacing Pulse Width: 0.5 ms
Lead Channel Setting Sensing Sensitivity: 0.5 mV
Pulse Gen Serial Number: 1245762
Zone Setting Status: 755011

## 2023-08-12 NOTE — Patient Instructions (Addendum)
Medication Instructions:  STOP Valsartan- please make sure you are not taking this medication.   Continue Metoprolol 25 mg (0.5 tablet) nightly.   *If you need a refill on your cardiac medications before your next appointment, please call your pharmacy*   Follow-Up: At [facility], you and your health needs are our priority.  As part of our continuing mission to provide you with exceptional heart care, we have created designated Provider Care Teams.  These Care Teams include your primary Cardiologist (physician) and Advanced Practice Providers (APPs -  Physician Assistants and Nurse Practitioners) who all work together to provide you with the care you need, when you need it.  We recommend signing up for the patient portal called "MyChart".  Sign up information is provided on this After Visit Summary.  MyChart is used to connect with patients for Virtual Visits (Telemedicine).  Patients are able to view lab/test results, encounter notes, upcoming appointments, etc.  Non-urgent messages can be sent to your provider as well.   To learn more about what you can do with MyChart, go to [email or web address].    Your next appointment:   Keep follow up as scheduled with Dr.Klein in December.

## 2023-09-04 DIAGNOSIS — J471 Bronchiectasis with (acute) exacerbation: Secondary | ICD-10-CM | POA: Diagnosis not present

## 2023-09-04 DIAGNOSIS — J479 Bronchiectasis, uncomplicated: Secondary | ICD-10-CM | POA: Diagnosis not present

## 2023-09-17 ENCOUNTER — Ambulatory Visit: Payer: PPO | Admitting: Internal Medicine

## 2023-09-17 ENCOUNTER — Encounter: Payer: Self-pay | Admitting: Internal Medicine

## 2023-09-17 VITALS — BP 118/70 | HR 69 | Temp 97.9°F | Ht 66.0 in | Wt 134.0 lb

## 2023-09-17 DIAGNOSIS — K219 Gastro-esophageal reflux disease without esophagitis: Secondary | ICD-10-CM | POA: Diagnosis not present

## 2023-09-17 DIAGNOSIS — Z23 Encounter for immunization: Secondary | ICD-10-CM

## 2023-09-17 DIAGNOSIS — F419 Anxiety disorder, unspecified: Secondary | ICD-10-CM

## 2023-09-17 DIAGNOSIS — I10 Essential (primary) hypertension: Secondary | ICD-10-CM

## 2023-09-17 DIAGNOSIS — I2510 Atherosclerotic heart disease of native coronary artery without angina pectoris: Secondary | ICD-10-CM

## 2023-09-17 DIAGNOSIS — E785 Hyperlipidemia, unspecified: Secondary | ICD-10-CM

## 2023-09-17 DIAGNOSIS — R29898 Other symptoms and signs involving the musculoskeletal system: Secondary | ICD-10-CM

## 2023-09-17 DIAGNOSIS — R9431 Abnormal electrocardiogram [ECG] [EKG]: Secondary | ICD-10-CM | POA: Insufficient documentation

## 2023-09-17 NOTE — Addendum Note (Signed)
Addended by: Cathleen Fears, Lamond Glantz P on: 09/17/2023 12:17 PM   Modules accepted: Orders

## 2023-09-17 NOTE — Assessment & Plan Note (Signed)
BP Readings from Last 3 Encounters:  09/17/23 118/70  08/12/23 120/60  07/12/23 112/67

## 2023-09-17 NOTE — Assessment & Plan Note (Signed)
Cont on Atenolol, Eliquis Lipitor, Losartan

## 2023-09-17 NOTE — Assessment & Plan Note (Signed)
Better  

## 2023-09-17 NOTE — Assessment & Plan Note (Signed)
Chronic On Lipitor 

## 2023-09-17 NOTE — Assessment & Plan Note (Signed)
Take Lexapro at hs

## 2023-09-17 NOTE — Assessment & Plan Note (Signed)
Try OTC Nexium to help cough

## 2023-09-17 NOTE — Progress Notes (Signed)
Subjective:  Patient ID: [name] Christensen, male    DOB: 01/30/1932  Age: 89 y.o. MRN: [id number]  CC: Follow-up (2 mnth f/u)   HPI [name] Christensen presents for CAD, dyslipidemia, CHF f/u  Outpatient Medications Prior to Visit  Medication Sig Dispense Refill   acetaminophen (TYLENOL) 500 MG tablet Take 500 mg by mouth at bedtime.     alfuzosin (UROXATRAL) 10 MG 24 hr tablet Take 10 mg by mouth every evening.      apixaban (ELIQUIS) 2.5 MG TABS tablet Take 1 tablet (2.5 mg total) by mouth 2 (two) times daily. 180 tablet 3   ascorbic acid (VITAMIN C) 250 MG tablet Take 250 mg by mouth daily.     atorvastatin (LIPITOR) 40 MG tablet Take 20 mg by mouth daily.     b complex vitamins capsule Take 1 capsule by mouth daily. 100 capsule 3   betamethasone dipropionate 0.05 % cream Apply 1 application. topically 2 (two) times daily as needed.     cinacalcet (SENSIPAR) 30 MG tablet Take 1 tablet (30 mg total) by mouth 3 (three) times a week. 25 tablet 0   cyanocobalamin (VITAMIN B12) 1000 MCG tablet Take 1 tablet (1,000 mcg total) by mouth every other day as needed. 100 tablet 3   Dextromethorphan-guaiFENesin (MUCINEX DM PO) Take 30 mg by mouth in the morning and at bedtime.     dofetilide (TIKOSYN) 250 MCG capsule TAKE 1 CAPSULE BY MOUTH TWICE A DAY 180 capsule 3   donepezil (ARICEPT) 5 MG tablet TAKE ONE TABLET BY MOUTH EVERY NIGHT AT BEDTIME 90 tablet 3   dutasteride (AVODART) 0.5 MG capsule Take 0.5 mg by mouth every evening.      escitalopram (LEXAPRO) 10 MG tablet Take 1 tablet (10 mg total) by mouth at bedtime. 90 tablet 1   famotidine (PEPCID) 20 MG tablet Take 20 mg by mouth daily.     guaiFENesin (MUCINEX) 600 MG 12 hr tablet Take 600 mg by mouth as needed for cough.     HYDROcodone bit-homatropine (HYCODAN) 5-1.5 MG/5ML syrup TAKE FIVE MLS BY MOUTH EVERY EIGHT (EIGHT) HOURS AS NEEDED FOR COUGH. 240 mL 0   metoprolol succinate (TOPROL-XL) 50 MG 24 hr tablet Take 0.5 tablets (25 mg total) by  mouth at bedtime as needed. 180 tablet 1   Multiple Vitamin (MULTIVITAMIN WITH MINERALS) TABS tablet Take 1 tablet by mouth every other day.     nitroGLYCERIN (NITROSTAT) 0.4 MG SL tablet DISSOLVE 1 TABLET UNDER TONGUE AS NEEDEDFOR CHEST PAIN. MAY REPEAT 5 MINUTES APART 3 TIMES IF NEEDED 25 tablet 3   omeprazole (PRILOSEC) 20 MG capsule Take 20 mg by mouth daily.     Plecanatide 3 MG TABS Take 3 mg by mouth daily. Replaces (Linaclotide) Linzess     Polyethyl Glycol-Propyl Glycol (SYSTANE OP) Place 1 drop into both eyes daily as needed (burning eyes).      triamcinolone cream (KENALOG) 0.1 % APPLY 1 APPLICATION TOPICALLY TWO TIMES DAILY AS NEEDED AVOIDING FACE, GROIN ANDARMPITS (Patient taking differently: Apply 1 Application topically 2 (two) times daily.) 80 g 0   iron polysaccharides (NIFEREX) 150 MG capsule Take 1 capsule (150 mg total) by mouth daily. 30 capsule 2   No facility-administered medications prior to visit.    ROS: Review of Systems  Constitutional:  Positive for fatigue. Negative for appetite change and unexpected weight change.  HENT:  Negative for congestion, nosebleeds, sneezing, sore throat and trouble swallowing.   Eyes:  Negative  for itching and visual disturbance.  Respiratory:  Negative for cough.   Cardiovascular:  Negative for chest pain, palpitations and leg swelling.  Gastrointestinal:  Negative for abdominal distention, blood in stool, diarrhea and nausea.  Genitourinary:  Negative for frequency and hematuria.  Musculoskeletal:  Positive for gait problem. Negative for back pain, joint swelling and neck pain.  Skin:  Negative for rash.  Neurological:  Positive for weakness. Negative for dizziness, tremors and speech difficulty.  Psychiatric/Behavioral:  Positive for decreased concentration and dysphoric mood. Negative for agitation, sleep disturbance and suicidal ideas. The patient is not nervous/anxious.     Objective:  BP 118/70 (BP Location: Right Arm,  Patient Position: Sitting, Cuff Size: Normal)   Pulse 69   Temp 97.9 F (36.6 C) (Oral)   Ht 5\' 6"  (1.676 m)   Wt 134 lb (60.8 kg)   SpO2 96%   BMI 21.63 kg/m   BP Readings from Last 3 Encounters:  09/17/23 118/70  08/12/23 120/60  07/12/23 112/67    Wt Readings from Last 3 Encounters:  09/17/23 134 lb (60.8 kg)  08/12/23 133 lb 4 oz (60.4 kg)  07/12/23 135 lb 3.2 oz (61.3 kg)    Physical Exam Constitutional:      General: He is not in acute distress.    Appearance: Normal appearance. He is well-developed.     Comments: NAD  Eyes:     Conjunctiva/sclera: Conjunctivae normal.     Pupils: Pupils are equal, round, and reactive to light.  Neck:     Thyroid: No thyromegaly.     Vascular: No JVD.  Cardiovascular:     Rate and Rhythm: Normal rate and regular rhythm.     Heart sounds: Normal heart sounds. No murmur heard.    No friction rub. No gallop.  Pulmonary:     Effort: Pulmonary effort is normal. No respiratory distress.     Breath sounds: Normal breath sounds. No wheezing or rales.  Chest:     Chest wall: No tenderness.  Abdominal:     General: Bowel sounds are normal. There is no distension.     Palpations: Abdomen is soft. There is no mass.     Tenderness: There is no abdominal tenderness. There is no guarding or rebound.  Musculoskeletal:        General: No tenderness. Normal range of motion.     Cervical back: Normal range of motion.  Lymphadenopathy:     Cervical: No cervical adenopathy.  Skin:    General: Skin is warm and dry.     Findings: No rash.  Neurological:     Mental Status: He is alert and oriented to person, place, and time. Mental status is at baseline.     Cranial Nerves: No cranial nerve deficit.     Motor: No abnormal muscle tone.     Coordination: Coordination normal.     Gait: Gait normal.     Deep Tendon Reflexes: Reflexes are normal and symmetric.  Psychiatric:        Behavior: Behavior normal.        Thought Content: Thought  content normal.        Judgment: Judgment normal.     Lab Results  Component Value Date   WBC 11.9 (H) 07/12/2023   HGB 10.3 (L) 07/12/2023   HCT 32.1 (L) 07/12/2023   PLT 138 (L) 07/12/2023   GLUCOSE 121 (H) 07/12/2023   CHOL 93 09/30/2022   TRIG 113.0 09/30/2022   HDL 44.80  09/30/2022   LDLCALC 26 09/30/2022   ALT 16 07/12/2023   AST 25 07/12/2023   NA 134 (L) 07/12/2023   K 3.9 07/12/2023   CL 105 07/12/2023   CREATININE 1.22 07/12/2023   BUN 24 (H) 07/12/2023   CO2 24 07/12/2023   TSH 2.05 04/01/2023   PSA 12.45 (H) 02/06/2016   INR 1.1 04/02/2017   HGBA1C 6.1 12/30/2019    DG Shoulder Left  Result Date: 07/12/2023 CLINICAL DATA:  Fall with left shoulder pain. EXAM: LEFT SHOULDER - 2+ VIEW COMPARISON:  11/20/2022 FINDINGS: No evidence of acute fracture or dislocation involving the left shoulder. Mild degenerative changes of the [facility] joint and glenohumeral joints. Cardiac pacemaker noted over the left chest wall. Degenerative changes of the spine. IMPRESSION: 1. No acute findings. 2. Mild degenerative changes. Electronically Signed   By: Elberta Fortis M.D.   On: 07/12/2023 09:13   CT Thoracic Spine Wo Contrast  Result Date: 07/12/2023 CLINICAL DATA:  Back trauma with no prior imaging EXAM: CT THORACIC AND LUMBAR SPINE WITHOUT CONTRAST TECHNIQUE: Multidetector CT imaging of the thoracic and lumbar spine was performed without contrast. Multiplanar CT image reconstructions were also generated. RADIATION DOSE REDUCTION: This exam was performed according to the departmental dose-optimization program which includes automated exposure control, adjustment of the mA and/or kV according to patient size and/or use of iterative reconstruction technique. COMPARISON:  None Available. FINDINGS: CT THORACIC SPINE FINDINGS Alignment: No traumatic malalignment.  Exaggerated kyphosis. Vertebrae: Subtle lucency in the anterior inferior corner of T1 since prior cervical spine CT 01/12/2023. Subtle  superior endplate concavity at T1 since prior. Chronic T2 superior endplate fracture mild height loss. Paraspinal and other soft tissues: No visible swelling. Disc levels: Ordinary spondylitic spurring CT LUMBAR SPINE FINDINGS Segmentation: Transitional S1 vertebra. Alignment: Mild levocurvature Vertebrae: No acute fracture or focal pathologic process. Paraspinal and other soft tissues: Negative. Disc levels: Remote L4 and L5 posterior decompression. Disc space narrowing with endplate and facet spurring diffusely, disc collapse most notable at L3-4. IMPRESSION: CT THORACIC SPINE IMPRESSION Age-indeterminate nondisplaced fracture of T1, new since cervical spine CT 01/12/2023. CT LUMBAR SPINE IMPRESSION No acute finding. Electronically Signed   By: Tiburcio Pea M.D.   On: 07/12/2023 08:38   CT Lumbar Spine Wo Contrast  Result Date: 07/12/2023 CLINICAL DATA:  Back trauma with no prior imaging EXAM: CT THORACIC AND LUMBAR SPINE WITHOUT CONTRAST TECHNIQUE: Multidetector CT imaging of the thoracic and lumbar spine was performed without contrast. Multiplanar CT image reconstructions were also generated. RADIATION DOSE REDUCTION: This exam was performed according to the departmental dose-optimization program which includes automated exposure control, adjustment of the mA and/or kV according to patient size and/or use of iterative reconstruction technique. COMPARISON:  None Available. FINDINGS: CT THORACIC SPINE FINDINGS Alignment: No traumatic malalignment.  Exaggerated kyphosis. Vertebrae: Subtle lucency in the anterior inferior corner of T1 since prior cervical spine CT 01/12/2023. Subtle superior endplate concavity at T1 since prior. Chronic T2 superior endplate fracture mild height loss. Paraspinal and other soft tissues: No visible swelling. Disc levels: Ordinary spondylitic spurring CT LUMBAR SPINE FINDINGS Segmentation: Transitional S1 vertebra. Alignment: Mild levocurvature Vertebrae: No acute fracture or  focal pathologic process. Paraspinal and other soft tissues: Negative. Disc levels: Remote L4 and L5 posterior decompression. Disc space narrowing with endplate and facet spurring diffusely, disc collapse most notable at L3-4. IMPRESSION: CT THORACIC SPINE IMPRESSION Age-indeterminate nondisplaced fracture of T1, new since cervical spine CT 01/12/2023. CT LUMBAR SPINE IMPRESSION No acute finding.  Electronically Signed   By: Tiburcio Pea M.D.   On: 07/12/2023 08:38   CT Head Wo Contrast  Result Date: 07/12/2023 CLINICAL DATA:  Minor head trauma Neck trauma EXAM: CT HEAD WITHOUT CONTRAST CT CERVICAL SPINE WITHOUT CONTRAST TECHNIQUE: Multidetector CT imaging of the head and cervical spine was performed following the standard protocol without intravenous contrast. Multiplanar CT image reconstructions of the cervical spine were also generated. RADIATION DOSE REDUCTION: This exam was performed according to the departmental dose-optimization program which includes automated exposure control, adjustment of the mA and/or kV according to patient size and/or use of iterative reconstruction technique. COMPARISON:  01/12/2023 FINDINGS: CT HEAD FINDINGS Brain: No evidence of acute infarction, hemorrhage, hydrocephalus, extra-axial collection or mass lesion/mass effect. Extensive chronic small vessel ischemia in the cerebral white matter. Age normal brain volume Vascular: No hyperdense vessel or unexpected calcification. Skull: No acute fracture Sinuses/Orbits: No visible injury CT CERVICAL SPINE FINDINGS Alignment: No traumatic malalignment Skull base and vertebrae: No acute cervical spine fracture. Remote T2 superior endplate fracture with minimal deformity. Soft tissues and spinal canal: No prevertebral fluid or swelling. No visible canal hematoma. Disc levels:  Ordinary degenerative endplate and facet spurring. Upper chest: Branching opacity in the left upper lobe, reference dedicated thoracic spine CT. IMPRESSION: No  evidence of acute intracranial or cervical spine injury. Electronically Signed   By: Tiburcio Pea M.D.   On: 07/12/2023 08:32   CT Cervical Spine Wo Contrast  Result Date: 07/12/2023 CLINICAL DATA:  Minor head trauma Neck trauma EXAM: CT HEAD WITHOUT CONTRAST CT CERVICAL SPINE WITHOUT CONTRAST TECHNIQUE: Multidetector CT imaging of the head and cervical spine was performed following the standard protocol without intravenous contrast. Multiplanar CT image reconstructions of the cervical spine were also generated. RADIATION DOSE REDUCTION: This exam was performed according to the departmental dose-optimization program which includes automated exposure control, adjustment of the mA and/or kV according to patient size and/or use of iterative reconstruction technique. COMPARISON:  01/12/2023 FINDINGS: CT HEAD FINDINGS Brain: No evidence of acute infarction, hemorrhage, hydrocephalus, extra-axial collection or mass lesion/mass effect. Extensive chronic small vessel ischemia in the cerebral white matter. Age normal brain volume Vascular: No hyperdense vessel or unexpected calcification. Skull: No acute fracture Sinuses/Orbits: No visible injury CT CERVICAL SPINE FINDINGS Alignment: No traumatic malalignment Skull base and vertebrae: No acute cervical spine fracture. Remote T2 superior endplate fracture with minimal deformity. Soft tissues and spinal canal: No prevertebral fluid or swelling. No visible canal hematoma. Disc levels:  Ordinary degenerative endplate and facet spurring. Upper chest: Branching opacity in the left upper lobe, reference dedicated thoracic spine CT. IMPRESSION: No evidence of acute intracranial or cervical spine injury. Electronically Signed   By: Tiburcio Pea M.D.   On: 07/12/2023 08:32    Assessment & Plan:   Problem List Items Addressed This Visit     Essential hypertension    BP Readings from Last 3 Encounters:  09/17/23 118/70  08/12/23 120/60  07/12/23 112/67          Relevant Orders   CBC with Differential/Platelet   Comprehensive metabolic panel   TSH   CAD (coronary artery disease) - Primary    Cont on Atenolol, Eliquis Lipitor, Losartan      Relevant Orders   CBC with Differential/Platelet   Comprehensive metabolic panel   TSH   GERD    Try OTC Nexium to help cough      Dyslipidemia    Chronic  On Lipitor  Relevant Orders   CBC with Differential/Platelet   Comprehensive metabolic panel   TSH   Anxiety disorder    Take Lexapro at hs      Weakness of both legs    Better         No orders of the defined types were placed in this encounter.     Follow-up: Return in about 2 months (around 11/17/2023) for a follow-up visit.  [name], MD

## 2023-09-19 DIAGNOSIS — N183 Chronic kidney disease, stage 3 unspecified: Secondary | ICD-10-CM | POA: Diagnosis not present

## 2023-09-19 DIAGNOSIS — I509 Heart failure, unspecified: Secondary | ICD-10-CM | POA: Diagnosis not present

## 2023-10-01 ENCOUNTER — Ambulatory Visit (INDEPENDENT_AMBULATORY_CARE_PROVIDER_SITE_OTHER): Payer: PPO

## 2023-10-01 ENCOUNTER — Telehealth: Payer: Self-pay | Admitting: Internal Medicine

## 2023-10-01 DIAGNOSIS — I255 Ischemic cardiomyopathy: Secondary | ICD-10-CM

## 2023-10-01 LAB — CUP PACEART REMOTE DEVICE CHECK
Battery Remaining Longevity: 25 mo
Battery Remaining Percentage: 28 %
Battery Voltage: 2.86 V
Brady Statistic AP VP Percent: 1.6 %
Brady Statistic AP VS Percent: 81 %
Brady Statistic AS VP Percent: 1 %
Brady Statistic AS VS Percent: 17 %
Brady Statistic RA Percent Paced: 80 %
Brady Statistic RV Percent Paced: 2.1 %
Date Time Interrogation Session: 20241106020018
HighPow Impedance: 74 Ohm
HighPow Impedance: 74 Ohm
Implantable Lead Connection Status: 753985
Implantable Lead Connection Status: 753985
Implantable Lead Implant Date: 20131218
Implantable Lead Implant Date: 20180514
Implantable Lead Location: 753859
Implantable Lead Location: 753860
Implantable Lead Model: 5076
Implantable Pulse Generator Implant Date: 20180514
Lead Channel Impedance Value: 390 Ohm
Lead Channel Impedance Value: 530 Ohm
Lead Channel Pacing Threshold Amplitude: 0.5 V
Lead Channel Pacing Threshold Amplitude: 1.25 V
Lead Channel Pacing Threshold Pulse Width: 0.5 ms
Lead Channel Pacing Threshold Pulse Width: 0.5 ms
Lead Channel Sensing Intrinsic Amplitude: 3.2 mV
Lead Channel Sensing Intrinsic Amplitude: 5.6 mV
Lead Channel Setting Pacing Amplitude: 1.5 V
Lead Channel Setting Pacing Amplitude: 2 V
Lead Channel Setting Pacing Pulse Width: 0.5 ms
Lead Channel Setting Sensing Sensitivity: 0.5 mV
Pulse Gen Serial Number: 1245762
Zone Setting Status: 755011

## 2023-10-01 NOTE — Telephone Encounter (Signed)
Patient called and requested office notes from his last visit on 09/17/2023 be sent to the [facility]. Patient provided a fax number of [phone] and said to label it "attention: Dr. Allena Katz." Best callback for patient is [phone].

## 2023-10-03 NOTE — Telephone Encounter (Signed)
I was able to fax over most recent office visit notes.

## 2023-10-05 DIAGNOSIS — J471 Bronchiectasis with (acute) exacerbation: Secondary | ICD-10-CM | POA: Diagnosis not present

## 2023-10-15 DIAGNOSIS — N401 Enlarged prostate with lower urinary tract symptoms: Secondary | ICD-10-CM | POA: Diagnosis not present

## 2023-10-15 DIAGNOSIS — R972 Elevated prostate specific antigen [PSA]: Secondary | ICD-10-CM | POA: Diagnosis not present

## 2023-10-15 DIAGNOSIS — R3912 Poor urinary stream: Secondary | ICD-10-CM | POA: Diagnosis not present

## 2023-10-21 ENCOUNTER — Other Ambulatory Visit: Payer: Self-pay | Admitting: Internal Medicine

## 2023-10-21 NOTE — Progress Notes (Signed)
Remote ICD transmission.   

## 2023-11-04 ENCOUNTER — Encounter: Payer: Self-pay | Admitting: Internal Medicine

## 2023-11-04 ENCOUNTER — Ambulatory Visit: Payer: PPO | Attending: Internal Medicine | Admitting: Internal Medicine

## 2023-11-04 VITALS — BP 120/60 | HR 70 | Ht 66.0 in | Wt 136.5 lb

## 2023-11-04 DIAGNOSIS — I255 Ischemic cardiomyopathy: Secondary | ICD-10-CM

## 2023-11-04 DIAGNOSIS — Z862 Personal history of diseases of the blood and blood-forming organs and certain disorders involving the immune mechanism: Secondary | ICD-10-CM

## 2023-11-04 DIAGNOSIS — Z95810 Presence of automatic (implantable) cardiac defibrillator: Secondary | ICD-10-CM

## 2023-11-04 DIAGNOSIS — R079 Chest pain, unspecified: Secondary | ICD-10-CM

## 2023-11-04 DIAGNOSIS — I4819 Other persistent atrial fibrillation: Secondary | ICD-10-CM

## 2023-11-04 DIAGNOSIS — I469 Cardiac arrest, cause unspecified: Secondary | ICD-10-CM | POA: Diagnosis not present

## 2023-11-04 DIAGNOSIS — J471 Bronchiectasis with (acute) exacerbation: Secondary | ICD-10-CM | POA: Diagnosis not present

## 2023-11-04 LAB — PACEMAKER DEVICE OBSERVATION

## 2023-11-04 NOTE — Patient Instructions (Addendum)
Medication Instructions:  Your physician recommends that you continue on your current medications as directed. Please refer to the Current Medication list given to you today.  *If you need a refill on your cardiac medications before your next appointment, please call your pharmacy*   Lab Work: CBC today  If you have labs (blood work) drawn today and your tests are completely normal, you will receive your results only by: MyChart Message (if you have MyChart) OR A paper copy in the mail If you have any lab test that is abnormal or we need to change your treatment, we will call you to review the results.   Testing/Procedures: Cardiac PET Scan    Please report to Radiology at the [facility] Main Entrance 30 minutes early for your test.  [address] [place], [place] [address]                         OR   Please report to Radiology at [facility] Main Entrance, medical mall, 30 mins prior to your test.  [address]  [place], [place]  [phone]  How to Prepare for Your Cardiac PET/CT Stress Test:  Nothing to eat or drink, except water, 3 hours prior to arrival time.  NO caffeine/decaffeinated products, or chocolate 12 hours prior to arrival. (Please note decaffeinated beverages (teas/coffees) still contain caffeine).  If you have caffeine within 12 hours prior, the test will need to be rescheduled.  Medication instructions: Do not take erectile dysfunction medications for 72 hours prior to test (sildenafil, tadalafil) Do not take nitrates (isosorbide mononitrate, Ranexa) the day before or day of test Do not take tamsulosin the day before or morning of test Hold theophylline containing medications for 12 hours. Hold Dipyridamole 48 hours prior to the test.  Diabetic Preparation: If able to eat breakfast prior to 3 hour fasting, you may take all medications, including your insulin. Do not worry if you miss your breakfast dose of  insulin - start at your next meal. If you do not eat prior to 3 hour fast-Hold all diabetes (oral and insulin) medications. Patients who wear a continuous glucose monitor MUST remove the device prior to scanning.  You may take your remaining medications with water.  NO perfume, cologne or lotion on chest or abdomen area. FEMALES - Please avoid wearing dresses to this appointment.  Total time is 1 to 2 hours; you may want to bring reading material for the waiting time.  IF YOU THINK YOU MAY BE PREGNANT, OR ARE NURSING PLEASE INFORM THE TECHNOLOGIST.  In preparation for your appointment, medication and supplies will be purchased.  Appointment availability is limited, so if you need to cancel or reschedule, please call the Radiology Department at [phone] [facility]) OR [phone] [facility]) 24 hours in advance to avoid a cancellation fee of $100.00  What to Expect When you Arrive:  Once you arrive and check in for your appointment, you will be taken to a preparation room within the Radiology Department.  A technologist or Nurse will obtain your medical history, verify that you are correctly prepped for the exam, and explain the procedure.  Afterwards, an IV will be started in your arm and electrodes will be placed on your skin for EKG monitoring during the stress portion of the exam. Then you will be escorted to the PET/CT scanner.  There, staff will get you positioned on the scanner and obtain a blood pressure  and EKG.  During the exam, you will continue to be connected to the EKG and blood pressure machines.  A small, safe amount of a radioactive tracer will be injected in your IV to obtain a series of pictures of your heart along with an injection of a stress agent.    After your Exam:  It is recommended that you eat a meal and drink a caffeinated beverage to counter act any effects of the stress agent.  Drink plenty of fluids for the remainder of the day and urinate frequently for the  first couple of hours after the exam.  Your doctor will inform you of your test results within 7-10 business days.  For more information and frequently asked questions, please visit our website: [email or web address]  For questions about your test or how to prepare for your test, please call: Cardiac Imaging Nurse Navigators Office: [phone]      Follow-Up: At [facility], you and your health needs are our priority.  As part of our continuing mission to provide you with exceptional heart care, we have created designated Provider Care Teams.  These Care Teams include your primary Cardiologist (physician) and Advanced Practice Providers (APPs -  Physician Assistants and Nurse Practitioners) who all work together to provide you with the care you need, when you need it.  We recommend signing up for the patient portal called "MyChart".  Sign up information is provided on this After Visit Summary.  MyChart is used to connect with patients for Virtual Visits (Telemedicine).  Patients are able to view lab/test results, encounter notes, upcoming appointments, etc.  Non-urgent messages can be sent to your provider as well.   To learn more about what you can do with MyChart, go to [email or web address].    Your next appointment:   2 months with Dr Graciela Husbands

## 2023-11-04 NOTE — Progress Notes (Signed)
Patient Care Team: Plotnikov, Georgina Quint, MD as PCP - General (Internal Medicine) Wendall Stade, MD as PCP - Cardiology (Cardiology) Duke Salvia, MD as PCP - Electrophysiology (Cardiology) Lindell Noe Lehman Prom., MD (Urology) [name], MD as Consulting Physician (General Surgery) Pyrtle, Carie Caddy, MD as Consulting Physician (Gastroenterology) [name], MD as Referring Physician (Ophthalmology) Nyoka Cowden, MD as Consulting Physician (Pulmonary Disease) Romero Belling, MD (Inactive) as Consulting Physician (Endocrinology)   HPI  [name] Christensen is a 89 y.o. male   seen in follow-up for ischemic cardiomyopathy with progressive left ventricular dysfunction in the context of frequent PVCs and chronic congestive heart failure.  He has persistent atrial fibrillation for which he was started on dofetilide 8/20  ( as well as PVCs) and anticoagulation with Apixaban    Have been considering AFib, Pacing and PVCs as cause of his symptoms and LV dysfunction>> reprogramming of his device eliminated pacing ( RBBB), dofetilide >>sinus, so the PVCs remain unaddressed with failed Anti-arrhythmic drugs suppression.  Most recently tried to increase rate for overdrive suppression, with some concern about encroaching on AV conduction and triggering RV apical pacing    The patient denies , nocturnal dyspnea, orthopnea or peripheral edema.  There have been no palpitations, or syncope.  Complains of shortness of breath with exercise, also times of chest pressure, can last >>1hr, often but not solely provoked by effort.  Orthostatic lightheadedness with repeatedly demonstrable drops in blood pressure 30-40 mm.  He has had more than a dozen falls over the last year Most of them occur at night on his way to or from the commode.  Occasionally during the day.   Been going to the gym regularly  DATE TEST PVC  1/15 Holter 13%  1/18 Holter 31%  2/20 ZIO 7.2% ( all one morphology)   9/20  Pacer 12%  11/20 Pacer 11%  2/21 Pacer 9.4%  9/21  13%  5/23 Pacer 15% (est from AV interval plot)          DATE TEST EF    12/13 Cath   Patent Grafts  2/15 Echo   45-50 %    12/17 Echo   50-55 %    4/19 Echo 20-25%    10/19 Echo  25-30%    1/20 Echo  20-25%    6/20 LHC 20-25% LIMA-LADatretic SVG-OM,SVG-D with filling of LAD,SVG-OM patent  11/20 Echo  25-30%   4/22 Echo  40-45%   9/22 Echo   40-45%   5/24 Echo (VA) 50%     Date Cr K Mg Hgb  6/20 1.14 4.9 2.0(3/20) 12.5   8/20 1.03 4.2 1.8 12.8  2/21 1.06 4.0 2.1 12.7  7/21 1.19 4.8    9/21 1.14 5.0 2.1 11.3  4/22 0.97 4.0  12.8  9/22 0.97 4.3  10.8  1/23     11.7  5/23 1.2 4.1    11/23 1.05 4.3  10.8  8/24 1.22 3.9 2.1 (2/24) 10.3   Antiarrhythmics Date Reason stopped  amio 7/19    dofetilide          Records and Results Reviewed *  Past Medical History:  Diagnosis Date   Actinic keratosis 06/09/2019   L sup forehead at hairline - bx proven   AICD (automatic cardioverter/defibrillator) present 04/07/2017   Allergy    Anxiety    BPH (benign prostatic hypertrophy)    elevated PSA Dr. Lindell Noe Bx 2010   CAP (  community acquired pneumonia) 11/06/2012   rx levaquin 12/14/12  - esr 51 12/31/2012 and no eos  05/2021 likely a COVID complication.  Prescribed cefdinir antibiotic, cough syrup with codeine, Medrol Dosepak  1/23 CAP --sx's - 80+% better. CXR: COPD changes with chronic basilar interstitial disease and suspect  superimposed acute infiltrate at RIGHT base.   Cardiac arrest [facility])    a. out-of-hospital arrest 10/31/2012 - EF 40-45%, patent grafts on cath, received [employer] AICD.   Carotid disease, bilateral (HCC)    a. 0-39% by doppers.   Cataract    bil cataracts removed   Chest pain, atypical 07/21/2013   CHF (congestive heart failure) (HCC)    Chills with fever 11/24/2017   12/18 Recurrent - ?prostatitis  1/19 ?URI  6/20    Chronic calculous cholecystitis 06/05/2017   Chronic diastolic CHF (congestive  heart failure) (HCC) 03/09/2018   10/19 Probable pacemaker cardiomyopathy per Dr Graciela Husbands w/a plan to   1) INCREASE cozaar (losartan) 50 mg - take 1 tablet by mouth once daily - d/c. Losartan was stopped on 05/12/19 due to high K  2) START mexiletine 150 mg- take 1 tablet by mouth TWICE daily   Coronary artery disease    a. s/p MI/CABG 2005. b. s/p cath at time of VF arrest 10/2013 - grafts patent.   Cough    Diverticulosis    Elevated LFTs    a. 10/2012 felt due to cardiac arrest - Hepatitis C Ab reactive from 11/01/12>>Hep C RNA PCR negative 11/04/12.    GERD (gastroesophageal reflux disease)    Hyperlipidemia    Hypertension    Internal hemorrhoids    Myocardial infarction (HCC)    2005 - CABG x 5 2005   Paroxysmal atrial fibrillation (HCC)    Continue on Eliquis, Toprol, Tikosyn  Dr Graciela Husbands   RBBB    Tubular adenoma of colon    Unstable angina (HCC)    Upper airway cough syndrome 03/26/2022   Onset ? Jan 2023   - try off Entresto  03/25/2022   - max gerd rx 08/01/2022 >>>       Ventricular bigeminy    a. Event monitor 01/2013: NSR with PVCs and occ bigeminy.   Weakness 04/09/2016   5/17 ?post-viral vs other, 3/18, 6/20, 1/23  CFS  He is very active for his age  63/23  Post-concussion  Take Lexapro and Toprol qhs  Reduce Toprol to 1 qhs    Past Surgical History:  Procedure Laterality Date   APPENDECTOMY     CARDIAC CATHETERIZATION  2007   with patent graft anatomy atretic left internal mammary  artery to the LAD which is nonobstructive. Will restart study June 08, 2007   CARDIOVERSION N/A 03/27/2018   Procedure: CARDIOVERSION;  Surgeon: Iran Ouch, MD;  Location: [facility] ORS;  Service: Cardiovascular;  Laterality: N/A;   CARDIOVERSION N/A 01/04/2019   Procedure: CARDIOVERSION (CATH LAB);  Surgeon: Iran Ouch, MD;  Location: [facility] ORS;  Service: Cardiovascular;  Laterality: N/A;   CARDIOVERSION N/A 07/07/2019   Procedure: CARDIOVERSION;  Surgeon: Wendall Stade, MD;  Location: [facility]  ENDOSCOPY;  Service: Cardiovascular;  Laterality: N/A;   CATARACT EXTRACTION W/PHACO Right 04/23/2016   Procedure: CATARACT EXTRACTION PHACO AND INTRAOCULAR LENS PLACEMENT (IOC);  Surgeon: [name], MD;  Location: [facility] ORS;  Service: Ophthalmology;  Laterality: Right;  [place] 1.09 AP% 19.3 CDE 13.38 Fluid pack lot # [id number]   CHOLECYSTECTOMY N/A 06/05/2017   Procedure: LAPAROSCOPIC CHOLECYSTECTOMY WITH INTRAOPERATIVE CHOLANGIOGRAM  ERAS PATHWAY POSSIBLE NEEDLE CORE BIOPSY OF LIVER;  Surgeon: [name], MD;  Location: [facility] OR;  Service: General;  Laterality: N/A;  ERAS PATHWAY   COLONOSCOPY     CORONARY ARTERY BYPASS GRAFT  2005   EYE SURGERY     ICD GENERATOR CHANGEOUT N/A 04/07/2017   Procedure: ICD Generator Changeout;  Surgeon: Duke Salvia, MD;  Location: [facility] INVASIVE CV LAB;  Service: Cardiovascular;  Laterality: N/A;   IMPLANTABLE CARDIOVERTER DEFIBRILLATOR IMPLANT N/A 11/11/2012   STJ single chamber ICD implanted by Dr Graciela Husbands for cardiac arrest    INGUINAL HERNIA REPAIR Bilateral 01/17/2015   Procedure: BILATERAL LAPAROSCOPIC INGUINAL HERNIA REPAIR WITH LEFT FEMORAL HERNIA REPAIR;  Surgeon: [name], MD;  Location: [facility] OR;  Service: General;  Laterality: Bilateral;   INSERTION OF MESH Bilateral 01/17/2015   Procedure: INSERTION OF MESH;  Surgeon: [name], MD;  Location: [facility] OR;  Service: General;  Laterality: Bilateral;   IR CATHETER TUBE CHANGE  12/27/2022   LAPAROSCOPIC CHOLECYSTECTOMY  06/05/2017   LEAD INSERTION N/A 04/07/2017   Procedure: RA Lead Insertion;  Surgeon: Duke Salvia, MD;  Location: [facility] INVASIVE CV LAB;  Service: Cardiovascular;  Laterality: N/A;   LEAD REVISION/REPAIR N/A 04/08/2017   Procedure: Atrial Lead Revision/Repair;  Surgeon: Duke Salvia, MD;  Location: [facility] INVASIVE CV LAB;  Service: Cardiovascular;  Laterality: N/A;   LEFT HEART CATH AND CORONARY ANGIOGRAPHY Left 05/14/2019   Procedure: LEFT HEART CATH AND CORONARY ANGIOGRAPHY;  Surgeon: Iran Ouch, MD;  Location: [facility] INVASIVE CV LAB;  Service: Cardiovascular;  Laterality: Left;   LEFT HEART CATHETERIZATION WITH CORONARY/GRAFT ANGIOGRAM N/A 11/10/2012   Procedure: LEFT HEART CATHETERIZATION WITH Isabel Caprice;  Surgeon: [name], MD;  Location: [facility] CATH LAB;  Service: Cardiovascular;  Laterality: N/A;   LIVER BIOPSY N/A 06/05/2017   Procedure: SINGLE SITE LIVER BIOPSY ERAS PATHWAY;  Surgeon: [name], MD;  Location: [facility] OR;  Service: General;  Laterality: N/A;  ERAS PATHWAY   LUMBAR FUSION  09/2007    Current Meds  Medication Sig   acetaminophen (TYLENOL) 500 MG tablet Take 500 mg by mouth at bedtime.   alfuzosin (UROXATRAL) 10 MG 24 hr tablet Take 10 mg by mouth every evening.    apixaban (ELIQUIS) 2.5 MG TABS tablet Take 1 tablet (2.5 mg total) by mouth 2 (two) times daily.   ascorbic acid (VITAMIN C) 250 MG tablet Take 250 mg by mouth daily.   atorvastatin (LIPITOR) 40 MG tablet Take 20 mg by mouth daily.   b complex vitamins capsule Take 1 capsule by mouth daily.   betamethasone dipropionate 0.05 % cream Apply 1 application. topically 2 (two) times daily as needed.   cinacalcet (SENSIPAR) 30 MG tablet Take 1 tablet (30 mg total) by mouth 3 (three) times a week.   cyanocobalamin (VITAMIN B12) 1000 MCG tablet Take 1 tablet (1,000 mcg total) by mouth every other day as needed.   Dextromethorphan-guaiFENesin (MUCINEX DM PO) Take 30 mg by mouth in the morning and at bedtime.   dofetilide (TIKOSYN) 250 MCG capsule TAKE 1 CAPSULE BY MOUTH TWICE A DAY   donepezil (ARICEPT) 5 MG tablet TAKE ONE TABLET BY MOUTH EVERY NIGHT AT BEDTIME   dutasteride (AVODART) 0.5 MG capsule Take 0.5 mg by mouth every evening.    escitalopram (LEXAPRO) 10 MG tablet Take 1 tablet (10 mg total) by mouth at bedtime.   famotidine (PEPCID) 20 MG tablet Take 20 mg by mouth daily.  guaiFENesin (MUCINEX) 600 MG 12 hr tablet Take 600 mg by mouth as needed for cough.   HYDROcodone  bit-homatropine (HYCODAN) 5-1.5 MG/5ML syrup TAKE FIVE MLS BY MOUTH EVERY EIGHT HOURS AS NEEDED FOR COUGH.   metoprolol succinate (TOPROL-XL) 50 MG 24 hr tablet Take 0.5 tablets (25 mg total) by mouth at bedtime as needed.   Multiple Vitamin (MULTIVITAMIN WITH MINERALS) TABS tablet Take 1 tablet by mouth every other day.   nitroGLYCERIN (NITROSTAT) 0.4 MG SL tablet DISSOLVE 1 TABLET UNDER TONGUE AS NEEDEDFOR CHEST PAIN. MAY REPEAT 5 MINUTES APART 3 TIMES IF NEEDED   omeprazole (PRILOSEC) 20 MG capsule Take 20 mg by mouth daily.   Plecanatide 3 MG TABS Take 3 mg by mouth daily. Replaces (Linaclotide) Linzess   Polyethyl Glycol-Propyl Glycol (SYSTANE OP) Place 1 drop into both eyes daily as needed (burning eyes).    triamcinolone cream (KENALOG) 0.1 % APPLY 1 APPLICATION TOPICALLY TWO TIMES DAILY AS NEEDED AVOIDING FACE, GROIN ANDARMPITS (Patient taking differently: Apply 1 Application topically 2 (two) times daily.)   Current Outpatient Medications on File Prior to Visit  Medication Sig Dispense Refill   acetaminophen (TYLENOL) 500 MG tablet Take 500 mg by mouth at bedtime.     alfuzosin (UROXATRAL) 10 MG 24 hr tablet Take 10 mg by mouth every evening.      apixaban (ELIQUIS) 2.5 MG TABS tablet Take 1 tablet (2.5 mg total) by mouth 2 (two) times daily. 180 tablet 3   ascorbic acid (VITAMIN C) 250 MG tablet Take 250 mg by mouth daily.     atorvastatin (LIPITOR) 40 MG tablet Take 20 mg by mouth daily.     b complex vitamins capsule Take 1 capsule by mouth daily. 100 capsule 3   betamethasone dipropionate 0.05 % cream Apply 1 application. topically 2 (two) times daily as needed.     cinacalcet (SENSIPAR) 30 MG tablet Take 1 tablet (30 mg total) by mouth 3 (three) times a week. 25 tablet 0   cyanocobalamin (VITAMIN B12) 1000 MCG tablet Take 1 tablet (1,000 mcg total) by mouth every other day as needed. 100 tablet 3   Dextromethorphan-guaiFENesin (MUCINEX DM PO) Take 30 mg by mouth in the morning and  at bedtime.     dofetilide (TIKOSYN) 250 MCG capsule TAKE 1 CAPSULE BY MOUTH TWICE A DAY 180 capsule 3   donepezil (ARICEPT) 5 MG tablet TAKE ONE TABLET BY MOUTH EVERY NIGHT AT BEDTIME 90 tablet 3   dutasteride (AVODART) 0.5 MG capsule Take 0.5 mg by mouth every evening.      escitalopram (LEXAPRO) 10 MG tablet Take 1 tablet (10 mg total) by mouth at bedtime. 90 tablet 1   famotidine (PEPCID) 20 MG tablet Take 20 mg by mouth daily.     guaiFENesin (MUCINEX) 600 MG 12 hr tablet Take 600 mg by mouth as needed for cough.     HYDROcodone bit-homatropine (HYCODAN) 5-1.5 MG/5ML syrup TAKE FIVE MLS BY MOUTH EVERY EIGHT HOURS AS NEEDED FOR COUGH. 240 mL 0   metoprolol succinate (TOPROL-XL) 50 MG 24 hr tablet Take 0.5 tablets (25 mg total) by mouth at bedtime as needed. 180 tablet 1   Multiple Vitamin (MULTIVITAMIN WITH MINERALS) TABS tablet Take 1 tablet by mouth every other day.     nitroGLYCERIN (NITROSTAT) 0.4 MG SL tablet DISSOLVE 1 TABLET UNDER TONGUE AS NEEDEDFOR CHEST PAIN. MAY REPEAT 5 MINUTES APART 3 TIMES IF NEEDED 25 tablet 3   omeprazole (PRILOSEC) 20 MG capsule Take 20  mg by mouth daily.     Plecanatide 3 MG TABS Take 3 mg by mouth daily. Replaces (Linaclotide) Linzess     Polyethyl Glycol-Propyl Glycol (SYSTANE OP) Place 1 drop into both eyes daily as needed (burning eyes).      triamcinolone cream (KENALOG) 0.1 % APPLY 1 APPLICATION TOPICALLY TWO TIMES DAILY AS NEEDED AVOIDING FACE, GROIN ANDARMPITS (Patient taking differently: Apply 1 Application topically 2 (two) times daily.) 80 g 0   iron polysaccharides (NIFEREX) 150 MG capsule Take 1 capsule (150 mg total) by mouth daily. 30 capsule 2   No current facility-administered medications on file prior to visit.     Allergies  Allergen Reactions   Ezetimibe Other (See Comments)    Pt was in coma for 6 days, numbness   Penicillins Other (See Comments)    BLISTERS BETWEEN FINGERS Did it involve swelling of the face/tongue/throat, SOB,  or low BP? No Did it involve sudden or severe rash/hives, skin peeling, or any reaction on the inside of your mouth or nose? Yes Did you need to seek medical attention at a hospital or doctor's office? No When did it last happen?      60 years If all above answers are "NO", may proceed with cephalosporin use.    Pravastatin Sodium Other (See Comments)    Aches and pains in joints   Rosuvastatin Other (See Comments)    MYALGIAS   Niacin Anxiety and Other (See Comments)    "Makes me feel nervous, jittery"      Review of Systems negative except from HPI and PMH  Physical Exam BP 120/60 (BP Location: Left Arm, Patient Position: Sitting, Cuff Size: Normal)   Pulse 70   Ht 5\' 6"  (1.676 m)   Wt 136 lb 8 oz (61.9 kg)   SpO2 95%   BMI 22.03 kg/m  Well developed and well nourished in no acute distress HENT normal Neck supple with JVP-flat Clear Device pocket well healed; without hematoma or erythema.  There is no tethering  Regular rate and rhythm, no  gallop 2/6 murmur Abd-soft with active BS No Clubbing cyanosis  edema Skin-warm and dry A & Oriented  Grossly normal sensory and motor function  ECG Atrial pacing @ 70 33/18/47 RBBB   Device function is normal. Programming changes atrial sensitivity programmed from auto to 1 mV; AV delay increased from 250--300 ms to avoid intermittent ventricular pacing   ASSESSMENT & PLAN:    Atrial fibrillation/flutter-persistent   PVCs-right bundle branch superior axis --identified by his device by ventricular rates slower than paced rate of 70 or AV interval shorter than 300 ms   Cardiomyopathy recurrent  Orthostatic hypotension   History of aborted sudden cardiac death     Implantable defibrillator-St. Jude      Ischemic cardiomyopathy with prior CABG     Congestive Heart Failure chronic systolic  High Risk Medication Surveillance,Dofetilide   Right bundle branch block.  Anemia   Sinus bradycardia/chronotropic  incompetence  Bronchiectasis   No interval atrial fibrillation.  Continue dofetilide.  Will check him again in 3 months for surveillance laboratories  Orthostasis limits GDMT for his cardiomyopathy; will continue the low-dose metoprolol.  Is also on an alpha-blocker for his urological issues. We discussed the physiology of orthostatic intolerance including gravitational fluid shifts and the impact of hypertensive vascular disease on orthostasis and treatment options.  We discussed pharmacological options including, pyridostigmine, .  We discussed nonpharmacological options including raising the HOB, isometric contraction upon standing, abdominal  binders  thigh sleeves. We emphasized the importance of recognizing the prodrome and sitting prior to falling, safety in the shower and in the bathroom and the avoidance of dehydration   We will begin with a nonpharmacological approach with abdominal and thigh binding if we need to medication, we will try pyridostigmine  Euvolemic.  With his chest pain and shortness of breath, we will undertake PET stress.  Blood pressure and orthostasis with the limit medical therapies.  Hemoglobin is drifted down again.  Will check his CBC     .  [name] Manges, MD  11/04/2023 11:34 AM     [facility] [address] [place] [address] [phone] (office) [phone] (fax) yes.

## 2023-11-05 LAB — CBC
Hematocrit: 33.6 % — ABNORMAL LOW (ref 37.5–51.0)
Hemoglobin: 10.7 g/dL — ABNORMAL LOW (ref 13.0–17.7)
MCH: 31 pg (ref 26.6–33.0)
MCHC: 31.8 g/dL (ref 31.5–35.7)
MCV: 97 fL (ref 79–97)
Platelets: 147 10*3/uL — ABNORMAL LOW (ref 150–450)
RBC: 3.45 x10E6/uL — ABNORMAL LOW (ref 4.14–5.80)
RDW: 12 % (ref 11.6–15.4)
WBC: 6.9 10*3/uL (ref 3.4–10.8)

## 2023-11-08 LAB — CUP PACEART INCLINIC DEVICE CHECK
Battery Remaining Longevity: 26 mo
Brady Statistic RA Percent Paced: 79 %
Brady Statistic RV Percent Paced: 2.1 %
Date Time Interrogation Session: 20241210120300
HighPow Impedance: 72 Ohm
Implantable Lead Connection Status: 753985
Implantable Lead Connection Status: 753985
Implantable Lead Implant Date: 20131218
Implantable Lead Implant Date: 20180514
Implantable Lead Location: 753859
Implantable Lead Location: 753860
Implantable Lead Model: 5076
Implantable Pulse Generator Implant Date: 20180514
Lead Channel Impedance Value: 387.5 Ohm
Lead Channel Impedance Value: 525 Ohm
Lead Channel Pacing Threshold Amplitude: 0.5 V
Lead Channel Pacing Threshold Amplitude: 0.5 V
Lead Channel Pacing Threshold Amplitude: 1.25 V
Lead Channel Pacing Threshold Amplitude: 1.25 V
Lead Channel Pacing Threshold Pulse Width: 0.5 ms
Lead Channel Pacing Threshold Pulse Width: 0.5 ms
Lead Channel Pacing Threshold Pulse Width: 0.5 ms
Lead Channel Pacing Threshold Pulse Width: 0.5 ms
Lead Channel Sensing Intrinsic Amplitude: 3.4 mV
Lead Channel Sensing Intrinsic Amplitude: 6.6 mV
Lead Channel Setting Pacing Amplitude: 1.375
Lead Channel Setting Pacing Amplitude: 2 V
Lead Channel Setting Pacing Pulse Width: 0.5 ms
Lead Channel Setting Sensing Sensitivity: 0.5 mV
Pulse Gen Serial Number: 1245762
Zone Setting Status: 755011

## 2023-11-11 ENCOUNTER — Encounter: Payer: PPO | Admitting: Internal Medicine

## 2023-11-14 ENCOUNTER — Encounter: Payer: Self-pay | Admitting: Internal Medicine

## 2023-11-25 ENCOUNTER — Other Ambulatory Visit: Payer: Self-pay

## 2023-11-25 DIAGNOSIS — I6523 Occlusion and stenosis of bilateral carotid arteries: Secondary | ICD-10-CM

## 2023-11-25 DIAGNOSIS — I4892 Unspecified atrial flutter: Secondary | ICD-10-CM

## 2023-11-25 DIAGNOSIS — E538 Deficiency of other specified B group vitamins: Secondary | ICD-10-CM

## 2023-11-25 DIAGNOSIS — I2510 Atherosclerotic heart disease of native coronary artery without angina pectoris: Secondary | ICD-10-CM

## 2023-12-01 DIAGNOSIS — C4491 Basal cell carcinoma of skin, unspecified: Secondary | ICD-10-CM

## 2023-12-01 DIAGNOSIS — C4492 Squamous cell carcinoma of skin, unspecified: Secondary | ICD-10-CM

## 2023-12-01 HISTORY — DX: Squamous cell carcinoma of skin, unspecified: C44.92

## 2023-12-01 HISTORY — DX: Basal cell carcinoma of skin, unspecified: C44.91

## 2023-12-02 ENCOUNTER — Other Ambulatory Visit: Payer: Self-pay | Admitting: *Deleted

## 2023-12-02 DIAGNOSIS — I4892 Unspecified atrial flutter: Secondary | ICD-10-CM

## 2023-12-02 DIAGNOSIS — I2510 Atherosclerotic heart disease of native coronary artery without angina pectoris: Secondary | ICD-10-CM | POA: Diagnosis not present

## 2023-12-02 DIAGNOSIS — I6523 Occlusion and stenosis of bilateral carotid arteries: Secondary | ICD-10-CM | POA: Diagnosis not present

## 2023-12-02 DIAGNOSIS — E538 Deficiency of other specified B group vitamins: Secondary | ICD-10-CM

## 2023-12-03 LAB — IRON,TIBC AND FERRITIN PANEL
Ferritin: 404 ng/mL — ABNORMAL HIGH (ref 30–400)
Iron Saturation: 17 % (ref 15–55)
Iron: 41 ug/dL (ref 38–169)
Total Iron Binding Capacity: 243 ug/dL — ABNORMAL LOW (ref 250–450)
UIBC: 202 ug/dL (ref 111–343)

## 2023-12-05 DIAGNOSIS — J471 Bronchiectasis with (acute) exacerbation: Secondary | ICD-10-CM | POA: Diagnosis not present

## 2023-12-09 ENCOUNTER — Telehealth: Payer: Self-pay

## 2023-12-09 NOTE — Telephone Encounter (Signed)
 Copied from CRM [id number]. Topic: General - Other >> Dec 09, 2023  3:23 PM Isabell A wrote: Reason for CRM: Spouse states she may not be able to make it to the appointment tomorrow, but would like to let Dr.Plotnikov know he had his blood work completed with cardiologist and his iron  is low - wants to confirm if that's why his energy has been low, she has been encouraging the patient to get up and get dressed in the morning but he is not wanting to do that.

## 2023-12-10 ENCOUNTER — Ambulatory Visit: Payer: PPO | Admitting: Internal Medicine

## 2023-12-11 NOTE — Telephone Encounter (Signed)
Noted. Thx.

## 2023-12-18 ENCOUNTER — Ambulatory Visit: Payer: PPO

## 2023-12-25 ENCOUNTER — Encounter: Payer: Self-pay | Admitting: Internal Medicine

## 2023-12-25 ENCOUNTER — Ambulatory Visit: Payer: PPO | Admitting: Internal Medicine

## 2023-12-25 VITALS — BP 118/62 | HR 90 | Temp 98.3°F | Ht 66.0 in | Wt 134.0 lb

## 2023-12-25 DIAGNOSIS — I10 Essential (primary) hypertension: Secondary | ICD-10-CM | POA: Diagnosis not present

## 2023-12-25 DIAGNOSIS — E785 Hyperlipidemia, unspecified: Secondary | ICD-10-CM

## 2023-12-25 DIAGNOSIS — I2510 Atherosclerotic heart disease of native coronary artery without angina pectoris: Secondary | ICD-10-CM

## 2023-12-25 DIAGNOSIS — R531 Weakness: Secondary | ICD-10-CM | POA: Diagnosis not present

## 2023-12-25 DIAGNOSIS — J479 Bronchiectasis, uncomplicated: Secondary | ICD-10-CM | POA: Diagnosis not present

## 2023-12-25 DIAGNOSIS — N183 Chronic kidney disease, stage 3 unspecified: Secondary | ICD-10-CM

## 2023-12-25 DIAGNOSIS — E538 Deficiency of other specified B group vitamins: Secondary | ICD-10-CM

## 2023-12-25 DIAGNOSIS — K529 Noninfective gastroenteritis and colitis, unspecified: Secondary | ICD-10-CM | POA: Diagnosis not present

## 2023-12-25 LAB — COMPREHENSIVE METABOLIC PANEL
ALT: 13 U/L (ref 0–53)
AST: 25 U/L (ref 0–37)
Albumin: 4 g/dL (ref 3.5–5.2)
Alkaline Phosphatase: 92 U/L (ref 39–117)
BUN: 31 mg/dL — ABNORMAL HIGH (ref 6–23)
CO2: 28 meq/L (ref 19–32)
Calcium: 11.2 mg/dL — ABNORMAL HIGH (ref 8.4–10.5)
Chloride: 102 meq/L (ref 96–112)
Creatinine, Ser: 1.49 mg/dL (ref 0.40–1.50)
GFR: 41.44 mL/min — ABNORMAL LOW (ref >60.00)
Glucose, Bld: 89 mg/dL (ref 70–99)
Potassium: 3.8 meq/L (ref 3.5–5.1)
Sodium: 137 meq/L (ref 135–145)
Total Bilirubin: 0.4 mg/dL (ref 0.2–1.2)
Total Protein: 8.4 g/dL — ABNORMAL HIGH (ref 6.0–8.3)

## 2023-12-25 LAB — CBC WITH DIFFERENTIAL/PLATELET
Basophils Absolute: 0 10*3/uL (ref 0.0–0.1)
Basophils Relative: 0.1 % (ref 0.0–3.0)
Eosinophils Absolute: 0.1 10*3/uL (ref 0.0–0.7)
Eosinophils Relative: 1.3 % (ref 0.0–5.0)
HCT: 37.1 % — ABNORMAL LOW (ref 39.0–52.0)
Hemoglobin: 12.2 g/dL — ABNORMAL LOW (ref 13.0–17.0)
Lymphocytes Relative: 24.4 % (ref 12.0–46.0)
Lymphs Abs: 2 10*3/uL (ref 0.7–4.0)
MCHC: 32.9 g/dL (ref 30.0–36.0)
MCV: 95.7 fL (ref 78.0–100.0)
Monocytes Absolute: 0.6 10*3/uL (ref 0.1–1.0)
Monocytes Relative: 7.3 % (ref 3.0–12.0)
Neutro Abs: 5.6 10*3/uL (ref 1.4–7.7)
Neutrophils Relative %: 66.9 % (ref 43.0–77.0)
Platelets: 172 10*3/uL (ref 150.0–400.0)
RBC: 3.87 Mil/uL — ABNORMAL LOW (ref 4.22–5.81)
RDW: 13.4 % (ref 11.5–15.5)
WBC: 8.4 10*3/uL (ref 4.0–10.5)

## 2023-12-25 LAB — TSH: TSH: 3.06 u[IU]/mL (ref 0.35–5.50)

## 2023-12-25 NOTE — Assessment & Plan Note (Signed)
Hydrate well Check CMET

## 2023-12-25 NOTE — Assessment & Plan Note (Signed)
BP Readings from Last 3 Encounters:  12/25/23 118/62  11/04/23 120/60  09/17/23 118/70

## 2023-12-25 NOTE — Assessment & Plan Note (Signed)
F/u w/Dr Eddie Dibbles Chronic cough Rx cough syr, cough drops

## 2023-12-25 NOTE — Assessment & Plan Note (Signed)
Post-gastroenteritis fatigue. Better Check labs

## 2023-12-25 NOTE — Progress Notes (Signed)
Subjective:  Patient ID: [name] Christensen, male    DOB: 1932/03/08  Age: 89 y.o. MRN: [id number]  CC: Medical Management of Chronic Issues (3 mnth f/u, Discuss medications, discuss labs and iron supplements)   HPI [name] presents for CAD, CHF C/o being sick x 2 days - diarrhea, n/v x 2 d; better now  Outpatient Medications Prior to Visit  Medication Sig Dispense Refill   acetaminophen (TYLENOL) 500 MG tablet Take 500 mg by mouth at bedtime.     alfuzosin (UROXATRAL) 10 MG 24 hr tablet Take 10 mg by mouth every evening.      apixaban (ELIQUIS) 2.5 MG TABS tablet Take 1 tablet (2.5 mg total) by mouth 2 (two) times daily. 180 tablet 3   ascorbic acid (VITAMIN C) 250 MG tablet Take 250 mg by mouth daily.     atorvastatin (LIPITOR) 40 MG tablet Take 20 mg by mouth daily.     b complex vitamins capsule Take 1 capsule by mouth daily. 100 capsule 3   betamethasone dipropionate 0.05 % cream Apply 1 application. topically 2 (two) times daily as needed.     cinacalcet (SENSIPAR) 30 MG tablet Take 1 tablet (30 mg total) by mouth 3 (three) times a week. 25 tablet 0   cyanocobalamin (VITAMIN B12) 1000 MCG tablet Take 1 tablet (1,000 mcg total) by mouth every other day as needed. 100 tablet 3   Dextromethorphan-guaiFENesin (MUCINEX DM PO) Take 30 mg by mouth in the morning and at bedtime.     dofetilide (TIKOSYN) 250 MCG capsule TAKE 1 CAPSULE BY MOUTH TWICE A DAY 180 capsule 3   donepezil (ARICEPT) 5 MG tablet TAKE ONE TABLET BY MOUTH EVERY NIGHT AT BEDTIME 90 tablet 3   dutasteride (AVODART) 0.5 MG capsule Take 0.5 mg by mouth every evening.      escitalopram (LEXAPRO) 10 MG tablet Take 1 tablet (10 mg total) by mouth at bedtime. 90 tablet 1   famotidine (PEPCID) 20 MG tablet Take 20 mg by mouth daily.     guaiFENesin (MUCINEX) 600 MG 12 hr tablet Take 600 mg by mouth as needed for cough.     HYDROcodone bit-homatropine (HYCODAN) 5-1.5 MG/5ML syrup TAKE FIVE MLS BY MOUTH EVERY EIGHT HOURS AS  NEEDED FOR COUGH. 240 mL 0   ipratropium (ATROVENT) 0.03 % nasal spray Place 2 sprays into both nostrils 3 (three) times daily.     metoprolol succinate (TOPROL-XL) 50 MG 24 hr tablet Take 0.5 tablets (25 mg total) by mouth at bedtime as needed. 180 tablet 1   Multiple Vitamin (MULTIVITAMIN WITH MINERALS) TABS tablet Take 1 tablet by mouth every other day.     nitroGLYCERIN (NITROSTAT) 0.4 MG SL tablet DISSOLVE 1 TABLET UNDER TONGUE AS NEEDEDFOR CHEST PAIN. MAY REPEAT 5 MINUTES APART 3 TIMES IF NEEDED 25 tablet 3   omeprazole (PRILOSEC) 20 MG capsule Take 20 mg by mouth daily.     Plecanatide 3 MG TABS Take 3 mg by mouth daily. Replaces (Linaclotide) Linzess     Polyethyl Glycol-Propyl Glycol (SYSTANE OP) Place 1 drop into both eyes daily as needed (burning eyes).      triamcinolone cream (KENALOG) 0.1 % APPLY 1 APPLICATION TOPICALLY TWO TIMES DAILY AS NEEDED AVOIDING FACE, GROIN ANDARMPITS (Patient taking differently: Apply 1 Application topically 2 (two) times daily.) 80 g 0   iron polysaccharides (NIFEREX) 150 MG capsule Take 1 capsule (150 mg total) by mouth daily. 30 capsule 2   No facility-administered medications  prior to visit.    ROS: Review of Systems  Constitutional:  Positive for fatigue. Negative for appetite change and unexpected weight change.  HENT:  Negative for congestion, nosebleeds, sneezing, sore throat and trouble swallowing.   Eyes:  Negative for itching and visual disturbance.  Respiratory:  Positive for cough.   Cardiovascular:  Negative for chest pain, palpitations and leg swelling.  Gastrointestinal:  Negative for abdominal distention, blood in stool, diarrhea and nausea.  Genitourinary:  Negative for frequency and hematuria.  Musculoskeletal:  Negative for back pain, gait problem, joint swelling and neck pain.  Skin:  Negative for rash.  Neurological:  Positive for weakness. Negative for dizziness, tremors and speech difficulty.  Psychiatric/Behavioral:  Positive  for decreased concentration. Negative for agitation, dysphoric mood, sleep disturbance and suicidal ideas. The patient is not nervous/anxious.     Objective:  BP 118/62 (BP Location: Left Arm, Patient Position: Sitting, Cuff Size: Normal)   Pulse 90   Temp 98.3 F (36.8 C) (Oral)   Ht 5\' 6"  (1.676 m)   Wt 134 lb (60.8 kg)   SpO2 95%   BMI 21.63 kg/m   BP Readings from Last 3 Encounters:  12/25/23 118/62  11/04/23 120/60  09/17/23 118/70    Wt Readings from Last 3 Encounters:  12/25/23 134 lb (60.8 kg)  11/04/23 136 lb 8 oz (61.9 kg)  09/17/23 134 lb (60.8 kg)    Physical Exam Constitutional:      General: He is not in acute distress.    Appearance: Normal appearance. He is well-developed.     Comments: NAD  Eyes:     Conjunctiva/sclera: Conjunctivae normal.     Pupils: Pupils are equal, round, and reactive to light.  Neck:     Thyroid: No thyromegaly.     Vascular: No JVD.  Cardiovascular:     Rate and Rhythm: Normal rate and regular rhythm.     Heart sounds: Normal heart sounds. No murmur heard.    No friction rub. No gallop.  Pulmonary:     Effort: Pulmonary effort is normal. No respiratory distress.     Breath sounds: Normal breath sounds. No wheezing or rales.  Chest:     Chest wall: No tenderness.  Abdominal:     General: Bowel sounds are normal. There is no distension.     Palpations: Abdomen is soft. There is no mass.     Tenderness: There is no abdominal tenderness. There is no guarding or rebound.  Musculoskeletal:        General: No tenderness. Normal range of motion.     Cervical back: Normal range of motion.  Lymphadenopathy:     Cervical: No cervical adenopathy.  Skin:    General: Skin is warm and dry.     Findings: No rash.  Neurological:     Mental Status: He is alert and oriented to person, place, and time.     Cranial Nerves: No cranial nerve deficit.     Motor: No abnormal muscle tone.     Coordination: Coordination normal.     Gait:  Gait normal.     Deep Tendon Reflexes: Reflexes are normal and symmetric.  Psychiatric:        Behavior: Behavior normal.        Thought Content: Thought content normal.        Judgment: Judgment normal.   In a w/c  Lab Results  Component Value Date   WBC 6.9 11/04/2023   HGB 10.7 (L)  11/04/2023   HCT 33.6 (L) 11/04/2023   PLT 147 (L) 11/04/2023   GLUCOSE 121 (H) 07/12/2023   CHOL 93 09/30/2022   TRIG 113.0 09/30/2022   HDL 44.80 09/30/2022   LDLCALC 26 09/30/2022   ALT 16 07/12/2023   AST 25 07/12/2023   NA 134 (L) 07/12/2023   K 3.9 07/12/2023   CL 105 07/12/2023   CREATININE 1.22 07/12/2023   BUN 24 (H) 07/12/2023   CO2 24 07/12/2023   TSH 2.05 04/01/2023   PSA 12.45 (H) 02/06/2016   INR 1.1 04/02/2017   HGBA1C 6.1 12/30/2019    DG Shoulder Left Result Date: 07/12/2023 CLINICAL DATA:  Fall with left shoulder pain. EXAM: LEFT SHOULDER - 2+ VIEW COMPARISON:  11/20/2022 FINDINGS: No evidence of acute fracture or dislocation involving the left shoulder. Mild degenerative changes of the [facility] joint and glenohumeral joints. Cardiac pacemaker noted over the left chest wall. Degenerative changes of the spine. IMPRESSION: 1. No acute findings. 2. Mild degenerative changes. Electronically Signed   By: Elberta Fortis M.D.   On: 07/12/2023 09:13   CT Thoracic Spine Wo Contrast Result Date: 07/12/2023 CLINICAL DATA:  Back trauma with no prior imaging EXAM: CT THORACIC AND LUMBAR SPINE WITHOUT CONTRAST TECHNIQUE: Multidetector CT imaging of the thoracic and lumbar spine was performed without contrast. Multiplanar CT image reconstructions were also generated. RADIATION DOSE REDUCTION: This exam was performed according to the departmental dose-optimization program which includes automated exposure control, adjustment of the mA and/or kV according to patient size and/or use of iterative reconstruction technique. COMPARISON:  None Available. FINDINGS: CT THORACIC SPINE FINDINGS Alignment: No  traumatic malalignment.  Exaggerated kyphosis. Vertebrae: Subtle lucency in the anterior inferior corner of T1 since prior cervical spine CT 01/12/2023. Subtle superior endplate concavity at T1 since prior. Chronic T2 superior endplate fracture mild height loss. Paraspinal and other soft tissues: No visible swelling. Disc levels: Ordinary spondylitic spurring CT LUMBAR SPINE FINDINGS Segmentation: Transitional S1 vertebra. Alignment: Mild levocurvature Vertebrae: No acute fracture or focal pathologic process. Paraspinal and other soft tissues: Negative. Disc levels: Remote L4 and L5 posterior decompression. Disc space narrowing with endplate and facet spurring diffusely, disc collapse most notable at L3-4. IMPRESSION: CT THORACIC SPINE IMPRESSION Age-indeterminate nondisplaced fracture of T1, new since cervical spine CT 01/12/2023. CT LUMBAR SPINE IMPRESSION No acute finding. Electronically Signed   By: Tiburcio Pea M.D.   On: 07/12/2023 08:38   CT Lumbar Spine Wo Contrast Result Date: 07/12/2023 CLINICAL DATA:  Back trauma with no prior imaging EXAM: CT THORACIC AND LUMBAR SPINE WITHOUT CONTRAST TECHNIQUE: Multidetector CT imaging of the thoracic and lumbar spine was performed without contrast. Multiplanar CT image reconstructions were also generated. RADIATION DOSE REDUCTION: This exam was performed according to the departmental dose-optimization program which includes automated exposure control, adjustment of the mA and/or kV according to patient size and/or use of iterative reconstruction technique. COMPARISON:  None Available. FINDINGS: CT THORACIC SPINE FINDINGS Alignment: No traumatic malalignment.  Exaggerated kyphosis. Vertebrae: Subtle lucency in the anterior inferior corner of T1 since prior cervical spine CT 01/12/2023. Subtle superior endplate concavity at T1 since prior. Chronic T2 superior endplate fracture mild height loss. Paraspinal and other soft tissues: No visible swelling. Disc levels:  Ordinary spondylitic spurring CT LUMBAR SPINE FINDINGS Segmentation: Transitional S1 vertebra. Alignment: Mild levocurvature Vertebrae: No acute fracture or focal pathologic process. Paraspinal and other soft tissues: Negative. Disc levels: Remote L4 and L5 posterior decompression. Disc space narrowing with endplate and facet spurring  diffusely, disc collapse most notable at L3-4. IMPRESSION: CT THORACIC SPINE IMPRESSION Age-indeterminate nondisplaced fracture of T1, new since cervical spine CT 01/12/2023. CT LUMBAR SPINE IMPRESSION No acute finding. Electronically Signed   By: Tiburcio Pea M.D.   On: 07/12/2023 08:38   CT Head Wo Contrast Result Date: 07/12/2023 CLINICAL DATA:  Minor head trauma Neck trauma EXAM: CT HEAD WITHOUT CONTRAST CT CERVICAL SPINE WITHOUT CONTRAST TECHNIQUE: Multidetector CT imaging of the head and cervical spine was performed following the standard protocol without intravenous contrast. Multiplanar CT image reconstructions of the cervical spine were also generated. RADIATION DOSE REDUCTION: This exam was performed according to the departmental dose-optimization program which includes automated exposure control, adjustment of the mA and/or kV according to patient size and/or use of iterative reconstruction technique. COMPARISON:  01/12/2023 FINDINGS: CT HEAD FINDINGS Brain: No evidence of acute infarction, hemorrhage, hydrocephalus, extra-axial collection or mass lesion/mass effect. Extensive chronic small vessel ischemia in the cerebral white matter. Age normal brain volume Vascular: No hyperdense vessel or unexpected calcification. Skull: No acute fracture Sinuses/Orbits: No visible injury CT CERVICAL SPINE FINDINGS Alignment: No traumatic malalignment Skull base and vertebrae: No acute cervical spine fracture. Remote T2 superior endplate fracture with minimal deformity. Soft tissues and spinal canal: No prevertebral fluid or swelling. No visible canal hematoma. Disc levels:   Ordinary degenerative endplate and facet spurring. Upper chest: Branching opacity in the left upper lobe, reference dedicated thoracic spine CT. IMPRESSION: No evidence of acute intracranial or cervical spine injury. Electronically Signed   By: Tiburcio Pea M.D.   On: 07/12/2023 08:32   CT Cervical Spine Wo Contrast Result Date: 07/12/2023 CLINICAL DATA:  Minor head trauma Neck trauma EXAM: CT HEAD WITHOUT CONTRAST CT CERVICAL SPINE WITHOUT CONTRAST TECHNIQUE: Multidetector CT imaging of the head and cervical spine was performed following the standard protocol without intravenous contrast. Multiplanar CT image reconstructions of the cervical spine were also generated. RADIATION DOSE REDUCTION: This exam was performed according to the departmental dose-optimization program which includes automated exposure control, adjustment of the mA and/or kV according to patient size and/or use of iterative reconstruction technique. COMPARISON:  01/12/2023 FINDINGS: CT HEAD FINDINGS Brain: No evidence of acute infarction, hemorrhage, hydrocephalus, extra-axial collection or mass lesion/mass effect. Extensive chronic small vessel ischemia in the cerebral white matter. Age normal brain volume Vascular: No hyperdense vessel or unexpected calcification. Skull: No acute fracture Sinuses/Orbits: No visible injury CT CERVICAL SPINE FINDINGS Alignment: No traumatic malalignment Skull base and vertebrae: No acute cervical spine fracture. Remote T2 superior endplate fracture with minimal deformity. Soft tissues and spinal canal: No prevertebral fluid or swelling. No visible canal hematoma. Disc levels:  Ordinary degenerative endplate and facet spurring. Upper chest: Branching opacity in the left upper lobe, reference dedicated thoracic spine CT. IMPRESSION: No evidence of acute intracranial or cervical spine injury. Electronically Signed   By: Tiburcio Pea M.D.   On: 07/12/2023 08:32    Assessment & Plan:   Problem List Items  Addressed This Visit     Essential hypertension - Primary   BP Readings from Last 3 Encounters:  12/25/23 118/62  11/04/23 120/60  09/17/23 118/70         Weakness   Post-gastroenteritis. Better Check labs      Bronchiectasis (HCC) assoc with possible MAI   F/u w/Dr Eddie Dibbles Chronic cough Rx cough syr, cough drops      Chronic renal insufficiency, stage 3 (moderate) (HCC)   Hydrate well Check CMET  Vitamin B12 deficiency (non anemic)   D/c  B12. Start B complex qd      Gastroenteritis   Post-gastroenteritis fatigue. Better Check labs         No orders of the defined types were placed in this encounter.     Follow-up: Return in about 3 months (around 03/24/2024) for a follow-up visit.  [name], MD

## 2023-12-25 NOTE — Assessment & Plan Note (Signed)
D/c  B12. Start B complex qd

## 2023-12-25 NOTE — Assessment & Plan Note (Addendum)
Post-gastroenteritis. Better Check labs

## 2023-12-28 ENCOUNTER — Encounter: Payer: Self-pay | Admitting: Internal Medicine

## 2023-12-31 ENCOUNTER — Ambulatory Visit: Payer: PPO

## 2023-12-31 DIAGNOSIS — I255 Ischemic cardiomyopathy: Secondary | ICD-10-CM

## 2023-12-31 LAB — CUP PACEART REMOTE DEVICE CHECK
Battery Remaining Longevity: 24 mo
Battery Remaining Percentage: 28 %
Battery Voltage: 2.84 V
Brady Statistic AP VP Percent: 3.2 %
Brady Statistic AP VS Percent: 77 %
Brady Statistic AS VP Percent: 1 %
Brady Statistic AS VS Percent: 20 %
Brady Statistic RA Percent Paced: 77 %
Brady Statistic RV Percent Paced: 3.3 %
Date Time Interrogation Session: 20250205020016
HighPow Impedance: 72 Ohm
HighPow Impedance: 72 Ohm
Implantable Lead Connection Status: 753985
Implantable Lead Connection Status: 753985
Implantable Lead Implant Date: 20131218
Implantable Lead Implant Date: 20180514
Implantable Lead Location: 753859
Implantable Lead Location: 753860
Implantable Lead Model: 5076
Implantable Pulse Generator Implant Date: 20180514
Lead Channel Impedance Value: 390 Ohm
Lead Channel Impedance Value: 530 Ohm
Lead Channel Pacing Threshold Amplitude: 0.5 V
Lead Channel Pacing Threshold Amplitude: 1.25 V
Lead Channel Pacing Threshold Pulse Width: 0.5 ms
Lead Channel Pacing Threshold Pulse Width: 0.5 ms
Lead Channel Sensing Intrinsic Amplitude: 3.7 mV
Lead Channel Sensing Intrinsic Amplitude: 5.9 mV
Lead Channel Setting Pacing Amplitude: 1.5 V
Lead Channel Setting Pacing Amplitude: 2 V
Lead Channel Setting Pacing Pulse Width: 0.5 ms
Lead Channel Setting Sensing Sensitivity: 0.5 mV
Pulse Gen Serial Number: 1245762
Zone Setting Status: 755011

## 2024-01-05 DIAGNOSIS — J471 Bronchiectasis with (acute) exacerbation: Secondary | ICD-10-CM | POA: Diagnosis not present

## 2024-01-15 ENCOUNTER — Ambulatory Visit: Payer: PPO | Admitting: Cardiology

## 2024-01-15 ENCOUNTER — Ambulatory Visit: Payer: PPO | Admitting: Internal Medicine

## 2024-01-21 ENCOUNTER — Telehealth (HOSPITAL_COMMUNITY): Payer: Self-pay | Admitting: *Deleted

## 2024-01-21 NOTE — Telephone Encounter (Signed)
 Reaching out to patient to offer assistance regarding upcoming cardiac imaging study; pt verbalizes understanding of appt date/time, parking situation and where to check in, pre-test NPO status and medications ordered, and verified current allergies; name and call back number provided for further questions should they arise Johney Frame RN Navigator Cardiac Imaging [facility] [phone] office [phone] cell  Patient aware to avoid caffeine for 12 hours prior to test and hold alfuzosin.

## 2024-01-22 ENCOUNTER — Ambulatory Visit
Admission: RE | Admit: 2024-01-22 | Discharge: 2024-01-22 | Disposition: A | Discharge status: Home / Self Care | Payer: PPO | Source: Ambulatory Visit | Attending: Internal Medicine | Admitting: Internal Medicine

## 2024-01-22 ENCOUNTER — Telehealth: Payer: Self-pay | Admitting: Internal Medicine

## 2024-01-22 DIAGNOSIS — J90 Pleural effusion, not elsewhere classified: Secondary | ICD-10-CM | POA: Diagnosis not present

## 2024-01-22 DIAGNOSIS — I700 Atherosclerosis of aorta: Secondary | ICD-10-CM | POA: Diagnosis not present

## 2024-01-22 DIAGNOSIS — I255 Ischemic cardiomyopathy: Secondary | ICD-10-CM | POA: Diagnosis not present

## 2024-01-22 DIAGNOSIS — I25118 Atherosclerotic heart disease of native coronary artery with other forms of angina pectoris: Secondary | ICD-10-CM | POA: Diagnosis not present

## 2024-01-22 DIAGNOSIS — R079 Chest pain, unspecified: Secondary | ICD-10-CM | POA: Diagnosis not present

## 2024-01-22 MED ORDER — REGADENOSON 0.4 MG/5ML IV SOLN
0.4000 mg | Freq: Once | INTRAVENOUS | Status: AC
  Administered 2024-01-22: 0.4 mg via INTRAVENOUS
  Filled 2024-01-22: qty 5

## 2024-01-22 MED ORDER — REGADENOSON 0.4 MG/5ML IV SOLN
INTRAVENOUS | Status: AC
  Filled 2024-01-22: qty 5

## 2024-01-22 MED ORDER — RUBIDIUM RB82 GENERATOR (RUBYFILL)
25.0000 | PACK | Freq: Once | INTRAVENOUS | Status: AC
  Administered 2024-01-22: 15.77 via INTRAVENOUS

## 2024-01-22 NOTE — Progress Notes (Signed)
 Patient presents for a cardiac PET stress test and tolerated procedure without incident. Patient maintained acceptable vital signs throughout the test and was offered caffeine after test.  Patient ambulated out of department with a steady gait.

## 2024-01-22 NOTE — Telephone Encounter (Signed)
 Jane with [facility] calling with over read for patient  Extensive, heterogeneous nodular and consolidative airspace disease throughout the lung bases, with associated bronchiolar plugging. Findings are consistent with infection, perhaps atypical mycobacterial, or alternately aspiration.  She wants to make Dr. Graciela Husbands aware

## 2024-01-22 NOTE — Telephone Encounter (Signed)
 Results from test -stat

## 2024-01-23 LAB — NM PET CT CARDIAC PERFUSION MULTI W/ABSOLUTE BLOODFLOW
LV dias vol: 84 mL (ref 62–150)
LV sys vol: 42 mL
MBFR: 2.27
Nuc Rest EF: 49 %
Nuc Stress EF: 50 %
Peak HR: 87 {beats}/min
Rest HR: 69 {beats}/min
Rest MBF: 0.85 ml/g/min
Rest Nuclear Isotope Dose: 15.8 mCi
SRS: 9
SSS: 11
ST Depression (mm): 0 mm
Stress MBF: 1.93 ml/g/min
Stress Nuclear Isotope Dose: 15.8 mCi
TID: 0.98

## 2024-01-26 ENCOUNTER — Encounter: Payer: Self-pay | Admitting: Internal Medicine

## 2024-01-26 NOTE — Telephone Encounter (Signed)
 Per results:  Please Inform Patient that -PET showed EF 49   is abnormal Perhaps a prir infarction but no ischemia This is all good news ( And the EF is better ) Thanks    Sent via mychart.   Thanks!

## 2024-01-28 NOTE — Progress Notes (Unsigned)
 Electrophysiology Clinic Note    Date:  01/29/2024  Patient ID:  [name] Christensen Sep 22, 1932, MRN [id number] PCP:  Tresa Garter, MD  Cardiologist:  Charlton Haws, MD Electrophysiologist: Sherryl Manges, MD   Discussed the use of AI scribe software for clinical note transcription with the patient, who gave verbal consent to proceed.   Patient Profile    Chief Complaint: AFib, PVC follow-up  History of Present Illness: [name] Christensen is a 89 y.o. male with PMH notable for ICM, CAD s/p CABG (2005), PVCs, HFrEF, persis AFib, bradycardia / chronotropic incompetence s/p PPM, RBBB ; seen today for Sherryl Manges, MD for routine electrophysiology followup.   He has had frequent falls throughout the past year with orthostatic hypotension dropping BP 30-43mmHg with position changes, necessitating GDMT adjustment.  He saw last saw Dr. Graciela Husbands 10/2023 where he c/o ongoing SOB with exertion. Also having chest pain lasting about an hour.   On follow-up today, his main complaint is ongonig sinus drainage and mucus, causing coughing and a feeling of choking at times. He c/o not sleeping well at night, admits to frequent naps during the day because he "does not feel well". His noticed a reduced appetite as of late. He has not fallen in several months.  He continues to have intermittent chest pain symptoms with activity, though admits to being mostly sedentary. He denies palpitations, SOB. He no longer goes to the gym regularly because he does not feel well.  He checks his BP several times throughout the week, both with standing and sitting. BP when sitting is 100-120s, BP when standing is 80s systolic.  Patient's wife joins for appt whom I have met before. She contributes that patient spends many days in his Pj's and housecoat, often does not leave his recliner chair for hours at a time. She also has noticed that his memory seems further reduced where he is having a harder time remembering names  and recent events.     Device Information: [employer] Jude dual chamber ICD, imp 10/2012; cardiac arrest secondary prevention MDT RA lead imp 03/2017  AAD History: Amiodarone, stopped 7/19 Tikosyn - loaded 06/2019    ROS:  Please see the history of present illness. All other systems are reviewed and otherwise negative.    Physical Exam    VS:  BP 118/60 (BP Location: Left Arm, Patient Position: Sitting, Cuff Size: Normal)   Pulse 70   Ht 5\' 6"  (1.676 m)   Wt 131 lb 9.6 oz (59.7 kg)   SpO2 94%   BMI 21.24 kg/m  BMI: Body mass index is 21.24 kg/m.  Wt Readings from Last 3 Encounters:  01/29/24 131 lb 9.6 oz (59.7 kg)  12/25/23 134 lb (60.8 kg)  11/04/23 136 lb 8 oz (61.9 kg)     GEN- The patient is well appearing, alert and oriented x 3 today.  Lungs- Clear to ausculation bilaterally, normal work of breathing.  Heart- Regular rate and rhythm, no murmurs, rubs or gallops Extremities- No peripheral edema, warm, dry Skin-  device pocket well-healed, no tethering   Device interrogation done today and reviewed by myself:  Battery 2 years Lead thresholds, impedence, sensing stable  Low AFib burden Rare, brief AFib episodes No changes made today   Studies Reviewed   Previous EP, cardiology notes.    EKG is ordered. Personal review of EKG from today shows:    EKG Interpretation Date/Time:  Thursday January 29 2024 09:31:05 EST Ventricular Rate:  70 PR Interval:  310 QRS Duration:  174 QT Interval:  458 QTC Calculation: 494 R Axis:   199  Text Interpretation: Atrial-paced rhythm with prolonged AV conduction Right bundle branch block Confirmed by Sherie Don [phone]) on 01/29/2024 9:34:14 AM    12/04/2022 EKG - AP with 1st deg HB, LAD, RBBB; PR 330, QRS 178, QT 466 QTC corrected for RBBB -   NM Pet CT Cardiac, 01/21/2023   LV perfusion is abnormal. There is no evidence of ischemia. There is evidence of infarction. Defect 1: There is a medium defect with severe  reduction in uptake present in the apical to basal lateral location(s) that is fixed. There is abnormal wall motion in the defect area. Consistent with infarction.   Rest left ventricular function is abnormal. Rest global function is mildly reduced. Rest EF: 49%. Stress left ventricular function is normal. Stress EF: 50%. End diastolic cavity size is normal.   Myocardial blood flow was computed to be 0.82ml/g/min at rest and 1.48ml/g/min at stress. Global myocardial blood flow reserve was 2.27 and was normal.   Coronary calcium assessment not performed due to prior revascularization.   Findings are consistent with infarction and no ischemia. The study is intermediate risk.  Chest CT, 01/21/2023 1. Extensive, heterogeneous nodular and consolidative airspace disease throughout the lung bases, with associated bronchiolar plugging. Findings are consistent with infection, perhaps atypical mycobacterial, or alternately aspiration. 2. Trace right pleural effusion. 3. Coronary artery disease. 4. Aortic valve calcifications. Correlate for echocardiographic evidence of aortic valve dysfunction.  TTE, 03/26/2023 (scanned into chart from [place])   TTE, 08/23/2021  1. Left ventricular ejection fraction, by estimation, is 40 to 45%. The left ventricle has mildly decreased function. The left ventricle demonstrates global hypokinesis. Left ventricular diastolic parameters are consistent with Grade I diastolic  dysfunction (impaired relaxation).   2. Right ventricular systolic function is normal. The right ventricular size is normal. There is normal pulmonary artery systolic pressure. The estimated right ventricular systolic pressure is 23.1 mmHg.   3. The mitral valve is normal in structure. Mild mitral valve regurgitation. No evidence of mitral stenosis.   4. The aortic valve was not well visualized. Aortic valve regurgitation is not visualized. Mild to moderate aortic valve sclerosis/calcification is present, without  any evidence of aortic stenosis.   5. The inferior vena cava is normal in size with greater than 50% respiratory variability, suggesting right atrial pressure of 3 mmHg.   6. Frequent PVCs noted.   7. Challenging images   Comparison(s): LVEF 40-45%.    Assessment and Plan     #) persis AFib #) tikosyn monitoring  EKG with stable intervals on tikosyn BID Low AFib burden via device Recent labwork with Cr up to 1.49, CrCl 63ml/min Reduce tikosyn to BID  #) Hypercoag d/t persis afib CHA2DS2-VASc Score = at least 5 [CHF History: 1, HTN History: 1, Diabetes History: 0, Stroke History: 0, Vascular Disease History: 1, Age Score: 2, Gender Score: 0].  Therefore, the patient's annual risk of stroke is 7.2 %.    Stroke ppx - 2.5mg  eliquis, appropriately dosed No bleeding concerns  #) orthostatic hypotension #) frequent falls Thankfully has not had any recent falls  Will stop metoprolol Encouraged ongoing behavior modifications of standing slowly and waiting several seconds before walking.   #) Memory Decline Notable memory decline, primary care aware. Encouragement of cognitive activities advised. - Ensure primary care manages memory decline. - Encourage cognitive activities.  #) Fatigue and  Sleep Disturbance Excessive daytime sleepiness and difficulty sleeping at night. Possible inactivity and depression contributing. - Encourage increased daytime activity. - Advise against long afternoon naps. - Consider discussing depression with primary care.  #) sinus node dysfunction s/p dual chamber ICD #) ICM Device functioning well, see paceart for details        Current medicines are reviewed at length with the patient today.   The patient does not have concerns regarding his medicines.  The following changes were made today:   REDUCE tikosyn to every 12 hours  Labs/ tests ordered today include:  Orders Placed This Encounter  Procedures   EKG 12-Lead      Disposition: Follow up with Dr. Graciela Husbands or EP APP in 3 months   Signed, Sherie Don, NP  01/29/24  1:07 PM  Electrophysiology [facility]

## 2024-01-29 ENCOUNTER — Ambulatory Visit: Payer: PPO | Attending: Cardiology | Admitting: Cardiology

## 2024-01-29 ENCOUNTER — Encounter: Payer: Self-pay | Admitting: Cardiology

## 2024-01-29 VITALS — BP 118/60 | HR 70 | Ht 66.0 in | Wt 131.6 lb

## 2024-01-29 DIAGNOSIS — I4819 Other persistent atrial fibrillation: Secondary | ICD-10-CM | POA: Diagnosis not present

## 2024-01-29 DIAGNOSIS — I495 Sick sinus syndrome: Secondary | ICD-10-CM | POA: Diagnosis not present

## 2024-01-29 DIAGNOSIS — R413 Other amnesia: Secondary | ICD-10-CM

## 2024-01-29 DIAGNOSIS — I255 Ischemic cardiomyopathy: Secondary | ICD-10-CM

## 2024-01-29 DIAGNOSIS — Z79899 Other long term (current) drug therapy: Secondary | ICD-10-CM

## 2024-01-29 DIAGNOSIS — Z95810 Presence of automatic (implantable) cardiac defibrillator: Secondary | ICD-10-CM

## 2024-01-29 LAB — CUP PACEART INCLINIC DEVICE CHECK
Battery Remaining Longevity: 26 mo
Brady Statistic RA Percent Paced: 80 %
Brady Statistic RV Percent Paced: 2.5 %
Date Time Interrogation Session: 20250306140506
HighPow Impedance: 74.25 Ohm
Implantable Lead Connection Status: 753985
Implantable Lead Connection Status: 753985
Implantable Lead Implant Date: 20131218
Implantable Lead Implant Date: 20180514
Implantable Lead Location: 753859
Implantable Lead Location: 753860
Implantable Lead Model: 5076
Implantable Pulse Generator Implant Date: 20180514
Lead Channel Impedance Value: 400 Ohm
Lead Channel Impedance Value: 525 Ohm
Lead Channel Pacing Threshold Amplitude: 0.5 V
Lead Channel Pacing Threshold Amplitude: 0.5 V
Lead Channel Pacing Threshold Amplitude: 1.5 V
Lead Channel Pacing Threshold Amplitude: 1.5 V
Lead Channel Pacing Threshold Pulse Width: 0.5 ms
Lead Channel Pacing Threshold Pulse Width: 0.5 ms
Lead Channel Pacing Threshold Pulse Width: 0.5 ms
Lead Channel Pacing Threshold Pulse Width: 0.5 ms
Lead Channel Sensing Intrinsic Amplitude: 3.6 mV
Lead Channel Sensing Intrinsic Amplitude: 6 mV
Lead Channel Setting Pacing Amplitude: 1.625
Lead Channel Setting Pacing Amplitude: 2 V
Lead Channel Setting Pacing Pulse Width: 0.5 ms
Lead Channel Setting Sensing Sensitivity: 0.5 mV
Pulse Gen Serial Number: 1245762
Zone Setting Status: 755011

## 2024-01-29 MED ORDER — DOFETILIDE 125 MCG PO CAPS: 125.0000 ug | ORAL_CAPSULE | Freq: Two times a day (BID) | ORAL | 2 refills | Status: AC

## 2024-01-29 NOTE — Patient Instructions (Signed)
 Medication Instructions:  STOP Metoprolol.  *If you need a refill on your cardiac medications before your next appointment, please call your pharmacy*   Follow-Up: At [facility], you and your health needs are our priority.  As part of our continuing mission to provide you with exceptional heart care, we have created designated Provider Care Teams.  These Care Teams include your primary Cardiologist (physician) and Advanced Practice Providers (APPs -  Physician Assistants and Nurse Practitioners) who all work together to provide you with the care you need, when you need it.  We recommend signing up for the patient portal called "MyChart".  Sign up information is provided on this After Visit Summary.  MyChart is used to connect with patients for Virtual Visits (Telemedicine).  Patients are able to view lab/test results, encounter notes, upcoming appointments, etc.  Non-urgent messages can be sent to your provider as well.   To learn more about what you can do with MyChart, go to [email or web address].    Your next appointment:   3-4 month(s)  Provider:   Sherie Don, NP    Other Instructions  Make sure you see your primary care provider for forgetfulness, and mucus/sinus drainage  Please make sure the only time you are in your bed is at night time.  Get on a schedule daily- get out of bed, take a shower, change clothes, make sure to wear your hearing aids daily, eat good balanced meals daily, and go to the gym at least 3 times a week.

## 2024-02-02 ENCOUNTER — Other Ambulatory Visit: Payer: Self-pay | Admitting: Internal Medicine

## 2024-02-02 ENCOUNTER — Telehealth: Payer: Self-pay

## 2024-02-02 DIAGNOSIS — J471 Bronchiectasis with (acute) exacerbation: Secondary | ICD-10-CM | POA: Diagnosis not present

## 2024-02-02 NOTE — Telephone Encounter (Signed)
 Alert remote transmission:  VT in the monitor zone 15 VT episodes in the monitor zone, HR's 141-151, likely SVT, longest duration 18sec.  Increase in HR's per trends - route to triage AMS episodes, PAT, longest duration 20sec Follow up as scheduled. LA, CVRS   3/6 appointment w/ [facility] NP metop was stopped and tikosyn reduced to 125 BID. VT episodes have same morphology as presenting and is 1:1 probably atrial driven. LMTCB.

## 2024-02-02 NOTE — Telephone Encounter (Signed)
 GI and Resp. Sounded like influenza. No dizziness or lighteheadedness. No falls. Not sure what to say about the BP. He was sick and didn't check it but will check it over the next few days, record it and call if anything is out of his normal. If you need anything else I can call.

## 2024-02-02 NOTE — Telephone Encounter (Signed)
 Spoke w/ patient. He reports being very ill Saturday and Sunday which correlates with these fast heart rates. Pt not 100% yet. I had asked him how his BP was since he was hypotensive per SR note. He's going to record a few days worth and if anything looks off he was going to call. Sent over to MD for review of arrhthymias and meds.

## 2024-02-06 NOTE — Telephone Encounter (Signed)
 Called patient, he states that he is still having issues with the mucus, but he has been resting well and overall feeling okay.   He has been checking his BP and HR at home  Readings below:  103/66 HR 100 (standing)   Another day (did not provide date) 137/77 HR 98 (sitting)  91/69 HR 116 (standing)   On the 12th-  142/77 HR 74 (sitting) 93/70 HR 105 (standing)    Patient is aware to change positions slowly. He states he will continue to do this. Continuing his medications at this time. Advised I would route to NP to maker her aware. Patient aware to call us with any concerns.

## 2024-02-06 NOTE — Progress Notes (Signed)
 Remote ICD transmission.

## 2024-02-12 ENCOUNTER — Telehealth: Payer: Self-pay

## 2024-02-12 NOTE — Telephone Encounter (Signed)
 Alert received from [employer] for 8 VT-1 and 7 NSVT classified episodes, all but 1 on 02/11/24, longest 1 min 8 sec on 02/11/24 at 10:37 am, available EGMs c/w 1:1 AV conduction, average V rates 139-142 bpm. Intermittent atrial events falling into refractory resulting in competitive atrial pacing with V undersensing and functional ventricular non-capture. Previously routed to clinic on 3/10, 3/11 and 02/04/24 with pt c/o flu - like symptoms per Epic note 02/02/24. Routed to clinic for update. 3 AMS episodes on 02/11/24, longest 6 min 12 sec, EGMs c/w competitive atrial pacing during sensor activity, peak V rates 126-140 bpm.  Patient had recent sickness 01/29/24. Please reciew note on 02/02/24 for previous alerts..  Attempted to call patient to recheck on any symptoms.

## 2024-02-17 NOTE — Telephone Encounter (Signed)
 Patient reports doing well today, better than the past few days. Patient reports decrease of tikosyn to 125 mcg BID. Previous does was tikosyn 250mg  BID. Patient reports d/t hypotension per the [place]. Last night 02/16/24 was his first dose.   Patient does keep a close check on BP at home.  BP sitting: 132/70 HR: 72 BP standing: 102/63 HR: 90  Patient encouraged to change positions slowly.   Patient advised I will forward to MD/APP to advise further.

## 2024-02-20 ENCOUNTER — Emergency Department

## 2024-02-20 ENCOUNTER — Other Ambulatory Visit: Payer: Self-pay

## 2024-02-20 ENCOUNTER — Telehealth: Payer: Self-pay

## 2024-02-20 ENCOUNTER — Emergency Department
Admission: EM | Admit: 2024-02-20 | Discharge: 2024-02-20 | Disposition: A | Discharge status: Home / Self Care | Attending: Emergency Medicine | Admitting: Emergency Medicine

## 2024-02-20 DIAGNOSIS — R0600 Dyspnea, unspecified: Secondary | ICD-10-CM | POA: Diagnosis not present

## 2024-02-20 DIAGNOSIS — R5383 Other fatigue: Secondary | ICD-10-CM

## 2024-02-20 DIAGNOSIS — J189 Pneumonia, unspecified organism: Secondary | ICD-10-CM | POA: Diagnosis not present

## 2024-02-20 DIAGNOSIS — I509 Heart failure, unspecified: Secondary | ICD-10-CM | POA: Insufficient documentation

## 2024-02-20 DIAGNOSIS — R0602 Shortness of breath: Secondary | ICD-10-CM | POA: Diagnosis not present

## 2024-02-20 DIAGNOSIS — R918 Other nonspecific abnormal finding of lung field: Secondary | ICD-10-CM | POA: Diagnosis not present

## 2024-02-20 DIAGNOSIS — I110 Hypertensive heart disease with heart failure: Secondary | ICD-10-CM | POA: Insufficient documentation

## 2024-02-20 LAB — CBC
HCT: 33.2 % — ABNORMAL LOW (ref 39.0–52.0)
Hemoglobin: 10.8 g/dL — ABNORMAL LOW (ref 13.0–17.0)
MCH: 30.7 pg (ref 26.0–34.0)
MCHC: 32.5 g/dL (ref 30.0–36.0)
MCV: 94.3 fL (ref 80.0–100.0)
Platelets: 158 10*3/uL (ref 150–400)
RBC: 3.52 MIL/uL — ABNORMAL LOW (ref 4.22–5.81)
RDW: 13 % (ref 11.5–15.5)
WBC: 5.1 10*3/uL (ref 4.0–10.5)
nRBC: 0 % (ref 0.0–0.2)

## 2024-02-20 LAB — URINALYSIS, ROUTINE W REFLEX MICROSCOPIC
Bacteria, UA: NONE SEEN
Bilirubin Urine: NEGATIVE
Glucose, UA: NEGATIVE mg/dL
Hgb urine dipstick: NEGATIVE
Ketones, ur: NEGATIVE mg/dL
Nitrite: NEGATIVE
Protein, ur: NEGATIVE mg/dL
Specific Gravity, Urine: 1.01 (ref 1.005–1.030)
pH: 5 (ref 5.0–8.0)

## 2024-02-20 LAB — PHOSPHORUS: Phosphorus: 2.4 mg/dL — ABNORMAL LOW (ref 2.5–4.6)

## 2024-02-20 LAB — BASIC METABOLIC PANEL WITH GFR
Anion gap: 10 (ref 5–15)
BUN: 20 mg/dL (ref 8–23)
CO2: 26 mmol/L (ref 22–32)
Calcium: 10.3 mg/dL (ref 8.9–10.3)
Chloride: 98 mmol/L (ref 98–111)
Creatinine, Ser: 1.27 mg/dL — ABNORMAL HIGH (ref 0.61–1.24)
GFR, Estimated: 54 mL/min — ABNORMAL LOW (ref >60)
Glucose, Bld: 106 mg/dL — ABNORMAL HIGH (ref 70–99)
Potassium: 4.3 mmol/L (ref 3.5–5.1)
Sodium: 134 mmol/L — ABNORMAL LOW (ref 135–145)

## 2024-02-20 LAB — TSH: TSH: 2.231 u[IU]/mL (ref 0.350–4.500)

## 2024-02-20 LAB — RESP PANEL BY RT-PCR (RSV, FLU A&B, COVID)  RVPGX2
Influenza A by PCR: NEGATIVE
Influenza B by PCR: NEGATIVE
Resp Syncytial Virus by PCR: NEGATIVE
SARS Coronavirus 2 by RT PCR: NEGATIVE

## 2024-02-20 LAB — TROPONIN I (HIGH SENSITIVITY): Troponin I (High Sensitivity): 15 ng/L (ref <18)

## 2024-02-20 LAB — MAGNESIUM: Magnesium: 2 mg/dL (ref 1.7–2.4)

## 2024-02-20 MED ORDER — METOPROLOL SUCCINATE ER 25 MG PO TB24: 25.0000 mg | ORAL_TABLET | Freq: Every day | ORAL | 0 refills | Status: DC

## 2024-02-20 MED ORDER — DOXYCYCLINE HYCLATE 100 MG PO CAPS: 100.0000 mg | ORAL_CAPSULE | Freq: Two times a day (BID) | ORAL | 0 refills | Status: DC

## 2024-02-20 NOTE — ED Notes (Signed)
 See triage notes. Patient was sent by his PCP for additional testing. Patient has had low BP, chills and bronchitis for the past two years with it worsening in the past one to two months, according to the patient's wife.

## 2024-02-20 NOTE — Telephone Encounter (Signed)
 Alert remote transmission:  Repeat alert Episodes with Alert Conditions VT/VF Episode Occurred.  Pt with upper respiratory infection early March.  There have been 11 alerts since 3/9 for VT/VF episodes 15 new VT-1 classified events, longest duration 14sec, HR's 140's-150's, EGMc/w atrial driven 1:1.  15 new NSVT events without EGM's,  AMS, longest duration 46sec c/w AT.  Hx of PAF, Eliquis per EPIC, some VT-1 events ending in AF HR trends remain elevated.  Route to triage for ongoing alerts Follow up as scheduled. LA, CVRS   Looks like 1:1. I am unable to determine what is driving what.   Per patient, he is completely physically exhausted. Had to sit down 5 times last night while getting ready for bed. SBP the last few days low 80's - 130's not all taken in the same position. Elevated HR in the 90's+ up. Pt is going to go to the ED this afternoon. Provider made aware.             [name]

## 2024-02-20 NOTE — ED Triage Notes (Signed)
 Dr. Graciela Husbands called patient and wanted him to come to the ED to run tests.  Wife states patient BP low, chills, bronchitis x 2 years, worse over past 1-2 months.  Patient states "I have no appetite and the mucous is really bugging me"  AAOx3.  Skin warm and dry. NAD

## 2024-02-20 NOTE — ED Provider Notes (Signed)
 [facility] Provider Note    Event Date/Time   First MD Initiated Contact with Patient 02/20/24 1516     (approximate)   History   Chief Complaint: Fatigue  HPI  [name] Christensen is a 89 y.o. male with a history of hypertension, bronchiectasis, GERD, CHF, paroxysmal atrial fibrillation who comes to the ED due to low energy.  Patient states that over the past several weeks he has felt very fatigued.  Most days he stays in pajamas and sits in his recliner all day.  Also complains of frequent chills for the past few years.  Copious sputum production with his bronchiectasis history.  Somewhat labile blood pressures at home.  Frequently needs to sit and rest when walking in the house or standing to change clothes.  This week, he sat in his chair all day on Monday Tuesday and Wednesday.  Yesterday Thursday he went to the gym, did a recumbent bicycle for 15 minutes then walked on a treadmill for 15 minutes then did some light weight exercise.  When he went home he felt completely exhausted.  Patient was notified by his cardiologist to come to the ED for evaluation today due to arrhythmia seen on his ICD monitor  Reviewing cardiology notes, on February 01, 2024 he was seen in the office, Tikosyn was reduced from 250 mcg twice daily to 125 mcg twice daily.  Metoprolol was also discontinued.  Since then he has had frequent episodes of tachycardia as well as NSVT.  PCP:  Tresa Garter, MD           Cardiologist:  Charlton Haws, MD Electrophysiologist: Sherryl Manges, MD Cardiology contacted pt today: Alert remote transmission:  Repeat alert Episodes with Alert Conditions VT/VF Episode Occurred.  Pt with upper respiratory infection early March.  There have been 11 alerts since 3/9 for VT/VF episodes 15 new VT-1 classified events, longest duration 14sec, HR's 140's-150's, EGMc/w atrial driven 1:1.  15 new NSVT events without EGM's,  AMS, longest duration 46sec c/w  AT.  Hx of PAF, Eliquis per EPIC, some VT-1 events ending in AF HR trends remain elevated.  Route to triage for ongoing alerts Follow up as scheduled. LA, CVRS     Looks like 1:1. I am unable to determine what is driving what.   Per patient, he is completely physically exhausted. Had to sit down 5 times last night while getting ready for bed. SBP the last few days low 80's - 130's not all taken in the same position. Elevated HR in the 90's+ up. Pt is going to go to the ED this afternoon. Provider made aware.           Physical Exam   Triage Vital Signs: ED Triage Vitals  Encounter Vitals Group     BP 02/20/24 1403 (!) 151/83     Systolic BP Percentile --      Diastolic BP Percentile --      Pulse Rate 02/20/24 1403 68     Resp 02/20/24 1403 17     Temp 02/20/24 1403 97.6 F (36.4 C)     Temp Source 02/20/24 1403 Oral     SpO2 02/20/24 1403 99 %     Weight 02/20/24 1408 131 lb 9.8 oz (59.7 kg)     Height --      Head Circumference --      Peak Flow --      Pain Score 02/20/24 1408 0  Pain Loc --      Pain Education --      Exclude from Growth Chart --     Most recent vital signs: Vitals:   02/20/24 1837 02/20/24 1839  BP: (!) 148/84 (!) 175/76  Pulse: 66 64  Resp: 17 17  Temp: 97.7 F (36.5 C) (!) 97.4 F (36.3 C)  SpO2: 99% 95%    General: Awake, no distress.  CV:  Good peripheral perfusion.  Regular rate rhythm Resp:  Normal effort.  No accessory muscle use or wheezing.  Left basilar crackles. Abd:  No distention.  Soft nontender Other:  No lower extremity edema   ED Results / Procedures / Treatments   Labs (all labs ordered are listed, but only abnormal results are displayed) Labs Reviewed  BASIC METABOLIC PANEL WITH GFR - Abnormal; Notable for the following components:      Result Value   Sodium 134 (*)    Glucose, Bld 106 (*)    Creatinine, Ser 1.27 (*)    GFR, Estimated 54 (*)    All other components within normal limits  CBC - Abnormal;  Notable for the following components:   RBC 3.52 (*)    Hemoglobin 10.8 (*)    HCT 33.2 (*)    All other components within normal limits  PHOSPHORUS - Abnormal; Notable for the following components:   Phosphorus 2.4 (*)    All other components within normal limits  RESP PANEL BY RT-PCR (RSV, FLU A&B, COVID)  RVPGX2  TSH  MAGNESIUM  URINALYSIS, ROUTINE W REFLEX MICROSCOPIC  CBG MONITORING, ED  TROPONIN I (HIGH SENSITIVITY)     EKG Interpreted by me Sinus rhythm rate of 71, normal axis, right bundle branch block, atrial paced rhythm, no acute ischemic changes.   RADIOLOGY CT chest interpreted by me, no sizable pericardial effusion.  Radiology report reviewed   PROCEDURES:  Procedures   MEDICATIONS ORDERED IN ED: Medications - No data to display   IMPRESSION / MDM / ASSESSMENT AND PLAN / ED COURSE  I reviewed the triage vital signs and the nursing notes.  DDx: Symptomatic atrial fibrillation, deconditioning, electrolyte derangement, anemia, COVID, influenza, hypothyroidism, non-STEMI, pneumonia, pericardial effusion  Patient's presentation is most consistent with acute presentation with potential threat to life or bodily function.  Patient presents with subacute to chronic fatigue which seems to be worse in the past few weeks.  This may be due to decreased antiarrhythmic use and inadequate control of his atrial fibrillation.  Labs in the ED are reassuring.  Will obtain CT chest to evaluate for pneumonia or pericardial effusion.  Discussed with cardiology Dr. Renato Gails who recommends resuming metoprolol XL 25 mg daily if workup continues to be reassuring.    ----------------------------------------- 6:56 PM on 02/20/2024 ----------------------------------------- Workup reassuring, CT chest showing some small areas of possible atypical pneumonia.  Will start doxycycline.  Will restart metoprolol.  Stable for discharge     FINAL CLINICAL IMPRESSION(S) / ED DIAGNOSES    Final diagnoses:  Other fatigue  Atypical pneumonia     Rx / DC Orders   ED Discharge Orders          Ordered    doxycycline (VIBRAMYCIN) 100 MG capsule  2 times daily        02/20/24 1855    metoprolol succinate (TOPROL XL) 25 MG 24 hr tablet  Daily        02/20/24 1855             Note:  This document was prepared using Dragon voice recognition software and may include unintentional dictation errors.   Sharman Cheek, MD 02/20/24 [phone]

## 2024-02-24 ENCOUNTER — Telehealth: Payer: Self-pay

## 2024-02-24 ENCOUNTER — Ambulatory Visit: Payer: Self-pay

## 2024-02-24 ENCOUNTER — Other Ambulatory Visit: Payer: Self-pay | Admitting: Internal Medicine

## 2024-02-24 NOTE — Telephone Encounter (Signed)
 Chief Complaint: Cough Symptoms: Productive cough Frequency: Ongoing Pertinent Negatives: Patient denies fever, SOB, CP Disposition: [] ED /[] Urgent Care (no appt availability in office) / [] Appointment(In office/virtual)/ []  [facility]/ [] Home Care/ [] Refused Recommended Disposition /[] [place] [facility]/ [x]  Follow-up with PCP Additional Notes: Pt reports he was recently seen in ED and was diagnosed with pneumonia. Pt notes he was placed on abx and continues those. Pt notes he is producing yellow sputum, worse in the AM. Pt notes the cough syrup helps with the cough significantly. This RN educated pt on home care, new-worsening symptoms, when to call back/seek emergent care. Pt verbalized understanding and agrees to plan.    Copied from CRM [id number]. Topic: Clinical - Medication Refill >> Feb 24, 2024  3:01 PM Rodman Pickle T wrote: Most Recent Primary Care Visit:  Provider: Tresa Garter  Department: [facility]  Visit Type: OFFICE VISIT  Date: 12/25/2023  Medication: HYDROcodone bit-homatropine (HYCODAN) 5-1.5 MG/5ML syrup [[id number]  Has the patient contacted their pharmacy? Yes (Agent: If no, request that the patient contact the pharmacy for the refill. If patient does not wish to contact the pharmacy document the reason why and proceed with request.) (Agent: If yes, when and what did the pharmacy advise?)  Is this the correct pharmacy for this prescription? Yes If no, delete pharmacy and type the correct one.  This is the patient's preferred pharmacy:  [facility] - [place], [place] - [address] [place] [address] Phone: [phone] Fax: [phone]  Has the prescription been filled recently? No  Is the patient out of the medication? Yes  Has the patient been seen for an appointment in the last year OR does the patient have an upcoming appointment? Yes  Can we respond through MyChart? No  Agent: Please be advised  that Rx refills may take up to 3 business days. We ask that you follow-up with your pharmacy. Reason for Disposition  Caller requesting a CONTROLLED substance prescription refill (e.g., narcotics, ADHD medicines)  Answer Assessment - Initial Assessment Questions 1. DRUG NAME: "What medicine do you need to have refilled?"     Hycodan 2. REFILLS REMAINING: "How many refills are remaining?" (Note: The label on the medicine or pill bottle will show how many refills are remaining. If there are no refills remaining, then a renewal may be needed.)     0 4. PRESCRIBING HCP: "Who prescribed it?" Reason: If prescribed by specialist, call should be referred to that group.     Dr. Posey Rea 5. SYMPTOMS: "Do you have any symptoms?"     Cough, pt reports ED notes he has pneumonia  Protocols used: Medication Refill and Renewal Call-A-AH

## 2024-02-24 NOTE — Transitions of Care (Post Inpatient/ED Visit) (Unsigned)
   02/24/2024  Name: [name] Christensen MRN: [id number] DOB: 11-19-1932  Today's TOC FU Call Status: Today's TOC FU Call Status:: Unsuccessful Call (1st Attempt) Unsuccessful Call (1st Attempt) Date: 02/24/24  Attempted to reach the patient regarding the most recent Inpatient/ED visit.  Follow Up Plan: Additional outreach attempts will be made to reach the patient to complete the Transitions of Care (Post Inpatient/ED visit) call.   Signature Karena Addison, LPN [facility] Nurse Health Advisor Direct Dial [phone]

## 2024-02-25 NOTE — Transitions of Care (Post Inpatient/ED Visit) (Unsigned)
   02/25/2024  Name: [name] Christensen MRN: [id number] DOB: [birth year]  Today's TOC FU Call Status: Today's TOC FU Call Status:: Unsuccessful Call (2nd Attempt) Unsuccessful Call (1st Attempt) Date: 02/24/24 Unsuccessful Call (2nd Attempt) Date: 02/25/24  Attempted to reach the patient regarding the most recent Inpatient/ED visit.  Follow Up Plan: Additional outreach attempts will be made to reach the patient to complete the Transitions of Care (Post Inpatient/ED visit) call.   Signature Karena Addison, LPN [facility] Nurse Health Advisor Direct Dial [phone]

## 2024-02-26 NOTE — Telephone Encounter (Signed)
 Dr.Plotnikov has since filled this prescription

## 2024-02-26 NOTE — Transitions of Care (Post Inpatient/ED Visit) (Signed)
   02/26/2024  Name: [name] Christensen MRN: [id number] DOB: 1932-07-25  Today's TOC FU Call Status: Today's TOC FU Call Status:: Unsuccessful Call (3rd Attempt) Unsuccessful Call (1st Attempt) Date: 02/24/24 Unsuccessful Call (2nd Attempt) Date: 02/25/24 Unsuccessful Call (3rd Attempt) Date: 02/26/24  Attempted to reach the patient regarding the most recent Inpatient/ED visit.  Follow Up Plan: No further outreach attempts will be made at this time. We have been unable to contact the patient.  Signature Karena Addison, LPN [facility] Nurse Health Advisor Direct Dial [phone]

## 2024-02-27 ENCOUNTER — Telehealth: Payer: Self-pay | Admitting: Internal Medicine

## 2024-02-27 NOTE — Telephone Encounter (Signed)
 Copied from CRM [id number]. Topic: Clinical - Medication Question >> Feb 27, 2024  9:14 AM Antwanette L wrote: Reason for CRM: Patient was currently in the ER on 3/28 and was prescribed doxycycline (VIBRAMYCIN) 100 MG capsule. The patient said the medicine is helping with his cough and mucus. The patient wants to know if he should continue taking the medicine. Patient is requesting a call back at   [phone]

## 2024-03-01 ENCOUNTER — Ambulatory Visit (INDEPENDENT_AMBULATORY_CARE_PROVIDER_SITE_OTHER): Admitting: Internal Medicine

## 2024-03-01 ENCOUNTER — Encounter: Payer: Self-pay | Admitting: Internal Medicine

## 2024-03-01 ENCOUNTER — Telehealth: Payer: Self-pay | Admitting: Internal Medicine

## 2024-03-01 VITALS — BP 142/68 | HR 70 | Temp 96.0°F | Ht 66.0 in | Wt 128.4 lb

## 2024-03-01 DIAGNOSIS — R053 Chronic cough: Secondary | ICD-10-CM

## 2024-03-01 DIAGNOSIS — J189 Pneumonia, unspecified organism: Secondary | ICD-10-CM

## 2024-03-01 DIAGNOSIS — J479 Bronchiectasis, uncomplicated: Secondary | ICD-10-CM | POA: Diagnosis not present

## 2024-03-01 DIAGNOSIS — R739 Hyperglycemia, unspecified: Secondary | ICD-10-CM

## 2024-03-01 DIAGNOSIS — I2510 Atherosclerotic heart disease of native coronary artery without angina pectoris: Secondary | ICD-10-CM | POA: Diagnosis not present

## 2024-03-01 MED ORDER — DOXYCYCLINE HYCLATE 100 MG PO CAPS: 100.0000 mg | ORAL_CAPSULE | Freq: Two times a day (BID) | ORAL | 0 refills | Status: AC

## 2024-03-01 NOTE — Assessment & Plan Note (Signed)
 Refractory and chronic -worse now The only thing that helps is Hycodan cough syrup.  He has been using it with caution  Potential benefits of a long term opioids use as well as potential risks (i.e. addiction risk, apnea etc) and complications (i.e. Somnolence, constipation and others) were explained to the patient and were aknowledged.

## 2024-03-01 NOTE — Progress Notes (Signed)
 Subjective:  Patient ID: [name] Christensen, male    DOB: 04/04/1932  Age: 89 y.o. MRN: [id number]  CC: Hospitalization Follow-up [facility] ([facility]) Follow Up, pneumonia, fatigue (02/20/24). Patient notes they are still having some issues with sputum build up in the chest and bronchitis. Antibiotics seem to work well for him but has not finished it. They have started patient back on metoprolol and reduced dosage of dofetillide (TIKOSYN))   HPI [employer] presents for fatigue, recent CAP, chronic cough - worse Feeling 40% better She drove here himself  Outpatient Medications Prior to Visit  Medication Sig Dispense Refill   acetaminophen (TYLENOL) 500 MG tablet Take 500 mg by mouth at bedtime.     alfuzosin (UROXATRAL) 10 MG 24 hr tablet Take 10 mg by mouth every evening.      apixaban (ELIQUIS) 2.5 MG TABS tablet Take 1 tablet (2.5 mg total) by mouth 2 (two) times daily. 180 tablet 3   ascorbic acid (VITAMIN C) 250 MG tablet Take 250 mg by mouth daily.     atorvastatin (LIPITOR) 40 MG tablet Take 20 mg by mouth daily.     b complex vitamins capsule Take 1 capsule by mouth daily. 100 capsule 3   betamethasone dipropionate 0.05 % cream Apply 1 application. topically 2 (two) times daily as needed.     cinacalcet (SENSIPAR) 30 MG tablet Take 1 tablet (30 mg total) by mouth 3 (three) times a week. 25 tablet 0   cyanocobalamin (VITAMIN B12) 1000 MCG tablet Take 1 tablet (1,000 mcg total) by mouth every other day as needed. 100 tablet 3   Dextromethorphan-guaiFENesin (MUCINEX DM PO) Take 30 mg by mouth in the morning and at bedtime.     dofetilide (TIKOSYN) 125 MCG capsule Take 1 capsule (125 mcg total) by mouth 2 (two) times daily. 180 capsule 2   donepezil (ARICEPT) 5 MG tablet TAKE ONE TABLET BY MOUTH EVERY NIGHT AT BEDTIME 90 tablet 3   dutasteride (AVODART) 0.5 MG capsule Take 0.5 mg by mouth every evening.      escitalopram (LEXAPRO) 10 MG tablet Take 1 tablet (10 mg total) by mouth at bedtime. 90  tablet 1   famotidine (PEPCID) 20 MG tablet Take 20 mg by mouth daily.     guaiFENesin (MUCINEX) 600 MG 12 hr tablet Take 600 mg by mouth as needed for cough.     HYDROcodone bit-homatropine (HYCODAN) 5-1.5 MG/5ML syrup TAKE FIVE MLS (1 TEASPOONFUL) BY MOUTH EVERY EIGHT HOURS AS NEEDED FOR COUGH. 240 mL 0   ipratropium (ATROVENT) 0.03 % nasal spray Place 2 sprays into both nostrils 3 (three) times daily.     metoprolol succinate (TOPROL XL) 25 MG 24 hr tablet Take 1 tablet (25 mg total) by mouth daily. 30 tablet 0   Multiple Vitamin (MULTIVITAMIN WITH MINERALS) TABS tablet Take 1 tablet by mouth every other day.     nitroGLYCERIN (NITROSTAT) 0.4 MG SL tablet DISSOLVE 1 TABLET UNDER TONGUE AS NEEDEDFOR CHEST PAIN. MAY REPEAT 5 MINUTES APART 3 TIMES IF NEEDED 25 tablet 3   omeprazole (PRILOSEC) 20 MG capsule Take 20 mg by mouth daily.     Plecanatide 3 MG TABS Take 3 mg by mouth daily. Replaces (Linaclotide) Linzess     Polyethyl Glycol-Propyl Glycol (SYSTANE OP) Place 1 drop into both eyes daily as needed (burning eyes).      triamcinolone cream (KENALOG) 0.1 % APPLY 1 APPLICATION TOPICALLY TWO TIMES DAILY AS NEEDED AVOIDING FACE, GROIN ANDARMPITS (Patient taking  differently: Apply 1 Application topically 2 (two) times daily.) 80 g 0   doxycycline (VIBRAMYCIN) 100 MG capsule Take 1 capsule (100 mg total) by mouth 2 (two) times daily for 10 days. 20 capsule 0   iron polysaccharides (NIFEREX) 150 MG capsule Take 1 capsule (150 mg total) by mouth daily. 30 capsule 2   No facility-administered medications prior to visit.    ROS: Review of Systems  Constitutional:  Negative for appetite change, fatigue and unexpected weight change.  HENT:  Negative for congestion, nosebleeds, rhinorrhea, sneezing, sore throat and trouble swallowing.   Eyes:  Negative for itching and visual disturbance.  Respiratory:  Positive for cough. Negative for shortness of breath.   Cardiovascular:  Negative for chest pain,  palpitations and leg swelling.  Gastrointestinal:  Negative for abdominal distention, blood in stool, diarrhea and nausea.  Genitourinary:  Negative for frequency and hematuria.  Musculoskeletal:  Negative for back pain, gait problem, joint swelling and neck pain.  Skin:  Negative for rash.  Neurological:  Positive for weakness. Negative for dizziness, tremors and speech difficulty.  Psychiatric/Behavioral:  Negative for agitation, dysphoric mood and sleep disturbance. The patient is not nervous/anxious.     Objective:  BP (!) 142/68   Pulse 70   Temp (!) 96 F (35.6 C)   Ht 5\' 6"  (1.676 m)   Wt 128 lb 6.4 oz (58.2 kg)   SpO2 96%   BMI 20.72 kg/m   BP Readings from Last 3 Encounters:  03/01/24 (!) 142/68  02/20/24 (!) 175/76  01/29/24 118/60    Wt Readings from Last 3 Encounters:  03/01/24 128 lb 6.4 oz (58.2 kg)  02/20/24 131 lb 9.8 oz (59.7 kg)  01/29/24 131 lb 9.6 oz (59.7 kg)    Physical Exam Constitutional:      General: He is not in acute distress.    Appearance: Normal appearance. He is well-developed.     Comments: NAD  Eyes:     Conjunctiva/sclera: Conjunctivae normal.     Pupils: Pupils are equal, round, and reactive to light.  Neck:     Thyroid: No thyromegaly.     Vascular: No JVD.  Cardiovascular:     Rate and Rhythm: Normal rate and regular rhythm.     Heart sounds: Normal heart sounds. No murmur heard.    No friction rub. No gallop.  Pulmonary:     Effort: Pulmonary effort is normal. No respiratory distress.     Breath sounds: Normal breath sounds. No wheezing or rales.  Chest:     Chest wall: No tenderness.  Abdominal:     General: Bowel sounds are normal. There is no distension.     Palpations: Abdomen is soft. There is no mass.     Tenderness: There is no abdominal tenderness. There is no guarding or rebound.  Musculoskeletal:        General: No tenderness. Normal range of motion.     Cervical back: Normal range of motion.     Right lower  leg: No edema.     Left lower leg: No edema.  Lymphadenopathy:     Cervical: No cervical adenopathy.  Skin:    General: Skin is warm and dry.     Findings: No rash.  Neurological:     Mental Status: He is alert and oriented to person, place, and time.     Cranial Nerves: No cranial nerve deficit.     Motor: No abnormal muscle tone.     Coordination:  Coordination normal.     Gait: Gait normal.     Deep Tendon Reflexes: Reflexes are normal and symmetric.  Psychiatric:        Behavior: Behavior normal.        Thought Content: Thought content normal.        Judgment: Judgment normal.   Right base rhonchi  Lab Results  Component Value Date   WBC 5.1 02/20/2024   HGB 10.8 (L) 02/20/2024   HCT 33.2 (L) 02/20/2024   PLT 158 02/20/2024   GLUCOSE 106 (H) 02/20/2024   CHOL 93 09/30/2022   TRIG 113.0 09/30/2022   HDL 44.80 09/30/2022   LDLCALC 26 09/30/2022   ALT 13 12/25/2023   AST 25 12/25/2023   NA 134 (L) 02/20/2024   K 4.3 02/20/2024   CL 98 02/20/2024   CREATININE 1.27 (H) 02/20/2024   BUN 20 02/20/2024   CO2 26 02/20/2024   TSH 2.231 02/20/2024   PSA 12.45 (H) 02/06/2016   INR 1.1 04/02/2017   HGBA1C 6.1 12/30/2019    CT Chest Wo Contrast Result Date: 02/20/2024 CLINICAL DATA:  Chronic dyspnea.  Cardiac PET CT 01/22/2024. EXAM: CT CHEST WITHOUT CONTRAST TECHNIQUE: Multidetector CT imaging of the chest was performed following the standard protocol without IV contrast. RADIATION DOSE REDUCTION: This exam was performed according to the departmental dose-optimization program which includes automated exposure control, adjustment of the mA and/or kV according to patient size and/or use of iterative reconstruction technique. COMPARISON:  CT angiogram chest 11/20/2022. FINDINGS: Cardiovascular: Heart is mildly enlarged. There is no pericardial effusion. Aorta is normal in size. Left-sided pacemaker is present. Mediastinum/Nodes: There are calcified subcarinal and right hilar lymph  nodes. No enlarged lymph nodes are seen. Visualized esophagus and thyroid gland are within normal limits. Lungs/Pleura: Examination is limited secondary to respiratory motion artifact. There are minimal tree-in-bud opacities in the left upper lobe. Peripheral reticular opacities are seen in the bilateral lower lungs/right greater than left. There is mild patchy airspace opacity in the right middle lobe. No pleural effusion or pneumothorax. No ground-glass opacities. There is a ground-glass nodule in the left lower lobe image 5/82 measuring 7 mm which is unchanged from 2023 study. Upper Abdomen: No acute abnormality. Musculoskeletal: Sternotomy wires are present.  No acute fracture. IMPRESSION: 1. Mild patchy airspace opacity in the right middle lobe may represent atelectasis or infection. 2. Minimal tree-in-bud opacities in the left upper lobe may be infectious/inflammatory. 3. Peripheral reticular opacities in the bilateral lower lungs/right greater than left, likely fibrotic changes. 4. Stable 7 mm ground-glass nodule in the left lower lobe unchanged from 2023. Continued follow-up up by CT is recommended in 12 months, with continued annual surveillance for a minimum of 3 years to exclude adenocarcinoma. recommendations are taken from: Recommendations for the Management of Subsolid Pulmonary Nodules Detected at CT: A Statement from the Fleischner Society Radiology 2013; 266:1, 304-317. 5. Mild cardiomegaly. Electronically Signed   By: Darliss Cheney M.D.   On: 02/20/2024 18:05    Assessment & Plan:   Problem List Items Addressed This Visit     CAD (coronary artery disease)   Cont on Atenolol, Eliquis Lipitor, Losartan      CAP (community acquired pneumonia) - Primary   On Doxy HHN - not helping Hycodan prn       Relevant Medications   doxycycline (VIBRAMYCIN) 100 MG capsule   Other Relevant Orders   CBC with Differential/Platelet   Comprehensive metabolic panel with GFR   T4, free  TSH    Hemoglobin A1c   Cough   Refractory and chronic -worse now The only thing that helps is Hycodan cough syrup.  He has been using it with caution  Potential benefits of a long term opioids use as well as potential risks (i.e. addiction risk, apnea etc) and complications (i.e. Somnolence, constipation and others) were explained to the patient and were aknowledged.      Bronchiectasis (HCC) assoc with possible MAI   Probable CAP - renewed Doxy x 10 more days      Relevant Orders   CBC with Differential/Platelet   Comprehensive metabolic panel with GFR   T4, free   TSH   Hemoglobin A1c   Other Visit Diagnoses       Hyperglycemia       Relevant Orders   Hemoglobin A1c         Meds ordered this encounter  Medications   doxycycline (VIBRAMYCIN) 100 MG capsule    Sig: Take 1 capsule (100 mg total) by mouth 2 (two) times daily for 10 days.    Dispense:  20 capsule    Refill:  0      Follow-up: Return in about 4 weeks (around 03/29/2024) for a follow-up visit.  [name], MD

## 2024-03-01 NOTE — Assessment & Plan Note (Signed)
Cont on Atenolol, Eliquis Lipitor, Losartan

## 2024-03-01 NOTE — Assessment & Plan Note (Signed)
 On Doxy HHN - not helping Hycodan prn

## 2024-03-01 NOTE — Telephone Encounter (Unsigned)
 Copied from CRM [id number]. Topic: MyChart - Other >> Mar 01, 2024  2:37 PM Shereese L wrote: Reason for CRM: betty Pugh(wife) stated that she will not be in attendance to the appt today and to please send her an email as to how the appointment went [email or web address] and [phone]

## 2024-03-01 NOTE — Assessment & Plan Note (Signed)
 Probable CAP - renewed Doxy x 10 more days

## 2024-03-04 ENCOUNTER — Ambulatory Visit: Attending: Internal Medicine | Admitting: Internal Medicine

## 2024-03-04 VITALS — BP 124/66 | HR 70 | Ht 67.0 in | Wt 130.0 lb

## 2024-03-04 DIAGNOSIS — I493 Ventricular premature depolarization: Secondary | ICD-10-CM | POA: Diagnosis not present

## 2024-03-04 DIAGNOSIS — I4819 Other persistent atrial fibrillation: Secondary | ICD-10-CM

## 2024-03-04 DIAGNOSIS — I5022 Chronic systolic (congestive) heart failure: Secondary | ICD-10-CM

## 2024-03-04 DIAGNOSIS — Z95810 Presence of automatic (implantable) cardiac defibrillator: Secondary | ICD-10-CM

## 2024-03-04 DIAGNOSIS — I255 Ischemic cardiomyopathy: Secondary | ICD-10-CM | POA: Diagnosis not present

## 2024-03-04 DIAGNOSIS — J471 Bronchiectasis with (acute) exacerbation: Secondary | ICD-10-CM | POA: Diagnosis not present

## 2024-03-04 NOTE — Telephone Encounter (Signed)
 Has since  been addressed with follow up visit with PCP

## 2024-03-04 NOTE — Patient Instructions (Signed)
 Medication Instructions:  Your physician recommends that you continue on your current medications as directed. Please refer to the Current Medication list given to you today.  *If you need a refill on your cardiac medications before your next appointment, please call your pharmacy*  Lab Work: None ordered.  If you have labs (blood work) drawn today and your tests are completely normal, you will receive your results only by: MyChart Message (if you have MyChart) OR A paper copy in the mail If you have any lab test that is abnormal or we need to change your treatment, we will call you to review the results.  Testing/Procedures: None ordered.   Follow-Up: At [facility], you and your health needs are our priority.  As part of our continuing mission to provide you with exceptional heart care, our providers are all part of one team.  This team includes your primary Cardiologist (physician) and Advanced Practice Providers or APPs (Physician Assistants and Nurse Practitioners) who all work together to provide you with the care you need, when you need it.  Your next appointment:   6 months

## 2024-03-04 NOTE — Progress Notes (Signed)
 Patient Care Team: Plotnikov, Georgina Quint, MD as PCP - General (Internal Medicine) Wendall Stade, MD as PCP - Cardiology (Cardiology) Duke Salvia, MD as PCP - Electrophysiology (Cardiology) Lindell Noe Lehman Prom., MD (Urology) [name], MD as Consulting Physician (General Surgery) Pyrtle, Carie Caddy, MD as Consulting Physician (Gastroenterology) [name], MD as Referring Physician (Ophthalmology) Nyoka Cowden, MD as Consulting Physician (Pulmonary Disease) Romero Belling, MD (Inactive) as Consulting Physician (Endocrinology) Mertie Moores, MD as Referring Physician (Specialist)   HPI  [name] Christensen is a 89 y.o. male   seen in follow-up for ischemic cardiomyopathy with progressive left ventricular dysfunction in the context of frequent PVCs and chronic congestive heart failure.  He has persistent atrial fibrillation for which he was started on dofetilide 8/20  ( as well as PVCs) and anticoagulation with Apixaban    Have been considering AFib, Pacing and PVCs as cause of his symptoms and LV dysfunction>> reprogramming of his device eliminated pacing ( RBBB), dofetilide >>sinus, so the PVCs remain unaddressed with failed Anti-arrhythmic drugs suppression.  Most recently tried to increase rate for overdrive suppression, with some concern about encroaching on AV conduction and triggering RV apical pacing    Patient has continued to struggle with enthusiasm for living.  His wife describes him as irritable staying in his  pajamas unless she needs to get dressed which she will do on his own if he goes out which is infrequently.  Increasingly less inclined to go out because he cannot remember people's names including the church  He describes her as irritable.  Goes to the gym infrequently because it is exhausting     DATE TEST PVC  1/15 Holter 13%  1/18 Holter 31%  2/20 ZIO 7.2% ( all one morphology)   9/20 Pacer 12%  11/20 Pacer 11%  2/21 Pacer 9.4%  9/21   13%  5/23 Pacer 15% (est from AV interval plot)          DATE TEST EF    12/13 Cath   Patent Grafts  2/15 Echo   45-50 %    12/17 Echo   50-55 %    4/19 Echo 20-25%    10/19 Echo  25-30%    1/20 Echo  20-25%    6/20 LHC 20-25% LIMA-LADatretic SVG-OM,SVG-D with filling of LAD,SVG-OM patent  11/20 Echo  25-30%   4/22 Echo  40-45%   9/22 Echo   40-45%   5/24 Echo (VA) 50%   2/25 PET stress 50% Without ischemia     Date Cr K Mg Hgb  6/20 1.14 4.9 2.0(3/20) 12.5   8/20 1.03 4.2 1.8 12.8  2/21 1.06 4.0 2.1 12.7  7/21 1.19 4.8    9/21 1.14 5.0 2.1 11.3  4/22 0.97 4.0  12.8  9/22 0.97 4.3  10.8  1/23     11.7  5/23 1.2 4.1    11/23 1.05 4.3  10.8  8/24 1.22 3.9 2.1 (2/24) 10.3  3/25 1.27 4.3  10.8   Antiarrhythmics Date Reason stopped  amio 7/19    dofetilide          Records and Results Reviewed *  Past Medical History:  Diagnosis Date   Actinic keratosis 06/09/2019   L sup forehead at hairline - bx proven   AICD (automatic cardioverter/defibrillator) present 04/07/2017   Allergy    Anxiety    BPH (benign prostatic hypertrophy)    elevated PSA Dr. Lindell Noe  Bx 2010   CAP (community acquired pneumonia) 11/06/2012   rx levaquin 12/14/12  - esr 51 12/31/2012 and no eos  05/2021 likely a COVID complication.  Prescribed cefdinir antibiotic, cough syrup with codeine, Medrol Dosepak  1/23 CAP --sx's - 80+% better. CXR: COPD changes with chronic basilar interstitial disease and suspect  superimposed acute infiltrate at RIGHT base.   Cardiac arrest [facility])    a. out-of-hospital arrest 10/31/2012 - EF 40-45%, patent grafts on cath, received [employer] AICD.   Carotid disease, bilateral (HCC)    a. 0-39% by doppers.   Cataract    bil cataracts removed   Chest pain, atypical 07/21/2013   CHF (congestive heart failure) (HCC)    Chills with fever 11/24/2017   12/18 Recurrent - ?prostatitis  1/19 ?URI  6/20    Chronic calculous cholecystitis 06/05/2017   Chronic diastolic CHF  (congestive heart failure) (HCC) 03/09/2018   10/19 Probable pacemaker cardiomyopathy per Dr Graciela Husbands w/a plan to   1) INCREASE cozaar (losartan) 50 mg - take 1 tablet by mouth once daily - d/c. Losartan was stopped on 05/12/19 due to high K  2) START mexiletine 150 mg- take 1 tablet by mouth TWICE daily   Coronary artery disease    a. s/p MI/CABG 2005. b. s/p cath at time of VF arrest 10/2013 - grafts patent.   Cough    Diverticulosis    Elevated LFTs    a. 10/2012 felt due to cardiac arrest - Hepatitis C Ab reactive from 11/01/12>>Hep C RNA PCR negative 11/04/12.    GERD (gastroesophageal reflux disease)    Hyperlipidemia    Hypertension    Internal hemorrhoids    Myocardial infarction (HCC)    2005 - CABG x 5 2005   Paroxysmal atrial fibrillation (HCC)    Continue on Eliquis, Toprol, Tikosyn  Dr Graciela Husbands   RBBB    Tubular adenoma of colon    Unstable angina (HCC)    Upper airway cough syndrome 03/26/2022   Onset ? Jan 2023   - try off Entresto  03/25/2022   - max gerd rx 08/01/2022 >>>       Ventricular bigeminy    a. Event monitor 01/2013: NSR with PVCs and occ bigeminy.   Weakness 04/09/2016   5/17 ?post-viral vs other, 3/18, 6/20, 1/23  CFS  He is very active for his age  108/23  Post-concussion  Take Lexapro and Toprol qhs  Reduce Toprol to 1 qhs    Past Surgical History:  Procedure Laterality Date   APPENDECTOMY     CARDIAC CATHETERIZATION  2007   with patent graft anatomy atretic left internal mammary  artery to the LAD which is nonobstructive. Will restart study June 08, 2007   CARDIOVERSION N/A 03/27/2018   Procedure: CARDIOVERSION;  Surgeon: Iran Ouch, MD;  Location: [facility] ORS;  Service: Cardiovascular;  Laterality: N/A;   CARDIOVERSION N/A 01/04/2019   Procedure: CARDIOVERSION (CATH LAB);  Surgeon: Iran Ouch, MD;  Location: [facility] ORS;  Service: Cardiovascular;  Laterality: N/A;   CARDIOVERSION N/A 07/07/2019   Procedure: CARDIOVERSION;  Surgeon: Wendall Stade, MD;   Location: [facility] ENDOSCOPY;  Service: Cardiovascular;  Laterality: N/A;   CATARACT EXTRACTION W/PHACO Right 04/23/2016   Procedure: CATARACT EXTRACTION PHACO AND INTRAOCULAR LENS PLACEMENT (IOC);  Surgeon: [name], MD;  Location: [facility] ORS;  Service: Ophthalmology;  Laterality: Right;  [place] 1.09 AP% 19.3 CDE 13.38 Fluid pack lot # [id number]   CHOLECYSTECTOMY N/A 06/05/2017   Procedure:  LAPAROSCOPIC CHOLECYSTECTOMY WITH INTRAOPERATIVE CHOLANGIOGRAM ERAS PATHWAY POSSIBLE NEEDLE CORE BIOPSY OF LIVER;  Surgeon: [name], MD;  Location: [facility] OR;  Service: General;  Laterality: N/A;  ERAS PATHWAY   COLONOSCOPY     CORONARY ARTERY BYPASS GRAFT  2005   EYE SURGERY     ICD GENERATOR CHANGEOUT N/A 04/07/2017   Procedure: ICD Generator Changeout;  Surgeon: Duke Salvia, MD;  Location: [facility] INVASIVE CV LAB;  Service: Cardiovascular;  Laterality: N/A;   IMPLANTABLE CARDIOVERTER DEFIBRILLATOR IMPLANT N/A 11/11/2012   STJ single chamber ICD implanted by Dr Graciela Husbands for cardiac arrest    INGUINAL HERNIA REPAIR Bilateral 01/17/2015   Procedure: BILATERAL LAPAROSCOPIC INGUINAL HERNIA REPAIR WITH LEFT FEMORAL HERNIA REPAIR;  Surgeon: [name], MD;  Location: [facility] OR;  Service: General;  Laterality: Bilateral;   INSERTION OF MESH Bilateral 01/17/2015   Procedure: INSERTION OF MESH;  Surgeon: [name], MD;  Location: [facility] OR;  Service: General;  Laterality: Bilateral;   IR CATHETER TUBE CHANGE  12/27/2022   LAPAROSCOPIC CHOLECYSTECTOMY  06/05/2017   LEAD INSERTION N/A 04/07/2017   Procedure: RA Lead Insertion;  Surgeon: Duke Salvia, MD;  Location: [facility] INVASIVE CV LAB;  Service: Cardiovascular;  Laterality: N/A;   LEAD REVISION/REPAIR N/A 04/08/2017   Procedure: Atrial Lead Revision/Repair;  Surgeon: Duke Salvia, MD;  Location: [facility] INVASIVE CV LAB;  Service: Cardiovascular;  Laterality: N/A;   LEFT HEART CATH AND CORONARY ANGIOGRAPHY Left 05/14/2019   Procedure: LEFT HEART CATH AND CORONARY ANGIOGRAPHY;   Surgeon: Iran Ouch, MD;  Location: [facility] INVASIVE CV LAB;  Service: Cardiovascular;  Laterality: Left;   LEFT HEART CATHETERIZATION WITH CORONARY/GRAFT ANGIOGRAM N/A 11/10/2012   Procedure: LEFT HEART CATHETERIZATION WITH Isabel Caprice;  Surgeon: [name], MD;  Location: [facility] CATH LAB;  Service: Cardiovascular;  Laterality: N/A;   LIVER BIOPSY N/A 06/05/2017   Procedure: SINGLE SITE LIVER BIOPSY ERAS PATHWAY;  Surgeon: [name], MD;  Location: [facility] OR;  Service: General;  Laterality: N/A;  ERAS PATHWAY   LUMBAR FUSION  09/2007    Current Meds  Medication Sig   acetaminophen (TYLENOL) 500 MG tablet Take 500 mg by mouth at bedtime.   alfuzosin (UROXATRAL) 10 MG 24 hr tablet Take 10 mg by mouth every evening.    apixaban (ELIQUIS) 2.5 MG TABS tablet Take 1 tablet (2.5 mg total) by mouth 2 (two) times daily.   ascorbic acid (VITAMIN C) 250 MG tablet Take 250 mg by mouth daily.   atorvastatin (LIPITOR) 40 MG tablet Take 20 mg by mouth daily.   b complex vitamins capsule Take 1 capsule by mouth daily.   betamethasone dipropionate 0.05 % cream Apply 1 application. topically 2 (two) times daily as needed.   cinacalcet (SENSIPAR) 30 MG tablet Take 1 tablet (30 mg total) by mouth 3 (three) times a week.   cyanocobalamin (VITAMIN B12) 1000 MCG tablet Take 1 tablet (1,000 mcg total) by mouth every other day as needed.   Dextromethorphan-guaiFENesin (MUCINEX DM PO) Take 30 mg by mouth in the morning and at bedtime.   dofetilide (TIKOSYN) 125 MCG capsule Take 1 capsule (125 mcg total) by mouth 2 (two) times daily.   donepezil (ARICEPT) 5 MG tablet TAKE ONE TABLET BY MOUTH EVERY NIGHT AT BEDTIME   doxycycline (VIBRAMYCIN) 100 MG capsule Take 1 capsule (100 mg total) by mouth 2 (two) times daily for 10 days.   dutasteride (AVODART) 0.5 MG capsule Take 0.5 mg by mouth every evening.  escitalopram (LEXAPRO) 10 MG tablet Take 1 tablet (10 mg total) by mouth at bedtime.   famotidine  (PEPCID) 20 MG tablet Take 20 mg by mouth daily.   guaiFENesin (MUCINEX) 600 MG 12 hr tablet Take 600 mg by mouth as needed for cough.   HYDROcodone bit-homatropine (HYCODAN) 5-1.5 MG/5ML syrup TAKE FIVE MLS (1 TEASPOONFUL) BY MOUTH EVERY EIGHT HOURS AS NEEDED FOR COUGH.   ipratropium (ATROVENT) 0.03 % nasal spray Place 2 sprays into both nostrils 3 (three) times daily.   metoprolol succinate (TOPROL XL) 25 MG 24 hr tablet Take 1 tablet (25 mg total) by mouth daily.   Multiple Vitamin (MULTIVITAMIN WITH MINERALS) TABS tablet Take 1 tablet by mouth every other day.   nitroGLYCERIN (NITROSTAT) 0.4 MG SL tablet DISSOLVE 1 TABLET UNDER TONGUE AS NEEDEDFOR CHEST PAIN. MAY REPEAT 5 MINUTES APART 3 TIMES IF NEEDED   omeprazole (PRILOSEC) 20 MG capsule Take 20 mg by mouth daily.   Plecanatide 3 MG TABS Take 3 mg by mouth daily. Replaces (Linaclotide) Linzess   Polyethyl Glycol-Propyl Glycol (SYSTANE OP) Place 1 drop into both eyes daily as needed (burning eyes).    triamcinolone cream (KENALOG) 0.1 % APPLY 1 APPLICATION TOPICALLY TWO TIMES DAILY AS NEEDED AVOIDING FACE, GROIN ANDARMPITS (Patient taking differently: Apply 1 Application topically 2 (two) times daily.)   Current Outpatient Medications on File Prior to Visit  Medication Sig Dispense Refill   acetaminophen (TYLENOL) 500 MG tablet Take 500 mg by mouth at bedtime.     alfuzosin (UROXATRAL) 10 MG 24 hr tablet Take 10 mg by mouth every evening.      apixaban (ELIQUIS) 2.5 MG TABS tablet Take 1 tablet (2.5 mg total) by mouth 2 (two) times daily. 180 tablet 3   ascorbic acid (VITAMIN C) 250 MG tablet Take 250 mg by mouth daily.     atorvastatin (LIPITOR) 40 MG tablet Take 20 mg by mouth daily.     b complex vitamins capsule Take 1 capsule by mouth daily. 100 capsule 3   betamethasone dipropionate 0.05 % cream Apply 1 application. topically 2 (two) times daily as needed.     cinacalcet (SENSIPAR) 30 MG tablet Take 1 tablet (30 mg total) by mouth 3  (three) times a week. 25 tablet 0   cyanocobalamin (VITAMIN B12) 1000 MCG tablet Take 1 tablet (1,000 mcg total) by mouth every other day as needed. 100 tablet 3   Dextromethorphan-guaiFENesin (MUCINEX DM PO) Take 30 mg by mouth in the morning and at bedtime.     dofetilide (TIKOSYN) 125 MCG capsule Take 1 capsule (125 mcg total) by mouth 2 (two) times daily. 180 capsule 2   donepezil (ARICEPT) 5 MG tablet TAKE ONE TABLET BY MOUTH EVERY NIGHT AT BEDTIME 90 tablet 3   doxycycline (VIBRAMYCIN) 100 MG capsule Take 1 capsule (100 mg total) by mouth 2 (two) times daily for 10 days. 20 capsule 0   dutasteride (AVODART) 0.5 MG capsule Take 0.5 mg by mouth every evening.      escitalopram (LEXAPRO) 10 MG tablet Take 1 tablet (10 mg total) by mouth at bedtime. 90 tablet 1   famotidine (PEPCID) 20 MG tablet Take 20 mg by mouth daily.     guaiFENesin (MUCINEX) 600 MG 12 hr tablet Take 600 mg by mouth as needed for cough.     HYDROcodone bit-homatropine (HYCODAN) 5-1.5 MG/5ML syrup TAKE FIVE MLS (1 TEASPOONFUL) BY MOUTH EVERY EIGHT HOURS AS NEEDED FOR COUGH. 240 mL 0   ipratropium (  ATROVENT) 0.03 % nasal spray Place 2 sprays into both nostrils 3 (three) times daily.     metoprolol succinate (TOPROL XL) 25 MG 24 hr tablet Take 1 tablet (25 mg total) by mouth daily. 30 tablet 0   Multiple Vitamin (MULTIVITAMIN WITH MINERALS) TABS tablet Take 1 tablet by mouth every other day.     nitroGLYCERIN (NITROSTAT) 0.4 MG SL tablet DISSOLVE 1 TABLET UNDER TONGUE AS NEEDEDFOR CHEST PAIN. MAY REPEAT 5 MINUTES APART 3 TIMES IF NEEDED 25 tablet 3   omeprazole (PRILOSEC) 20 MG capsule Take 20 mg by mouth daily.     Plecanatide 3 MG TABS Take 3 mg by mouth daily. Replaces (Linaclotide) Linzess     Polyethyl Glycol-Propyl Glycol (SYSTANE OP) Place 1 drop into both eyes daily as needed (burning eyes).      triamcinolone cream (KENALOG) 0.1 % APPLY 1 APPLICATION TOPICALLY TWO TIMES DAILY AS NEEDED AVOIDING FACE, GROIN ANDARMPITS  (Patient taking differently: Apply 1 Application topically 2 (two) times daily.) 80 g 0   iron polysaccharides (NIFEREX) 150 MG capsule Take 1 capsule (150 mg total) by mouth daily. 30 capsule 2   No current facility-administered medications on file prior to visit.     Allergies  Allergen Reactions   Ezetimibe Other (See Comments)    Pt was in coma for 6 days, numbness   Penicillins Other (See Comments)    BLISTERS BETWEEN FINGERS Did it involve swelling of the face/tongue/throat, SOB, or low BP? No Did it involve sudden or severe rash/hives, skin peeling, or any reaction on the inside of your mouth or nose? Yes Did you need to seek medical attention at a hospital or doctor's office? No When did it last happen?      60 years If all above answers are "NO", may proceed with cephalosporin use.    Pravastatin Sodium Other (See Comments)    Aches and pains in joints   Rosuvastatin Other (See Comments)    MYALGIAS   Niacin Anxiety and Other (See Comments)    "Makes me feel nervous, jittery"      Review of Systems negative except from HPI and PMH  Physical Exam BP 124/66   Pulse 70   Ht 5\' 7"  (1.702 m)   Wt 130 lb (59 kg)   SpO2 93%   BMI 20.36 kg/m  Well developed and well nourished in no acute distress HENT normal Neck supple with JVP-flat Clear Device pocket well healed; without hematoma or erythema.  There is no tethering  Regular rate and rhythm, 2/6 murmur Abd-soft with active BS No Clubbing cyanosis  edema Skin-warm and dry A & Oriented  Grossly normal sensory and motor function  ECG atrial pacing with first-degree AV block and occasional PVCs 36/18/44  Device function is normal. Programming changes none  See Paceart for details       ASSESSMENT & PLAN:    Atrial fibrillation/flutter-persistent   PVCs-right bundle branch superior axis --identified by his device by ventricular rates slower than paced rate of 70 or AV interval shorter than 300 ms    Cardiomyopathy recurrent  Orthostatic hypotension   History of aborted sudden cardiac death     Implantable defibrillator-St. Jude      Ischemic cardiomyopathy with prior CABG     Congestive Heart Failure chronic systolic  High Risk Medication Surveillance,Dofetilide   Right bundle branch block.  Anemia   Sinus bradycardia/chronotropic incompetence  Bronchiectasis     \Multiple issues.  Exercise intolerance likely MAC  multifactorial including his lungs, his PET stress was impressively close to normal I wonder whether his AV conduction been so long contributes.  His ejection fraction was much worse with ventricular pacing.  As next visit perhaps we can try to create some degree of fusion by shortening his AV delay   No interval atrial fibrillation.  Continue on dofetilide.  No bleeding continue Eliquis.  Continue metoprolol for ischemic heart disease and cardiomyopathy.  Lengthy discussion regarding the marital pickle     .  Sherryl Manges, MD  03/04/2024 3:55 PM     [facility] [address] [place] [address] [phone] (office) [phone] (fax) yes.

## 2024-03-10 DIAGNOSIS — H43813 Vitreous degeneration, bilateral: Secondary | ICD-10-CM | POA: Diagnosis not present

## 2024-03-10 DIAGNOSIS — M3501 Sicca syndrome with keratoconjunctivitis: Secondary | ICD-10-CM | POA: Diagnosis not present

## 2024-03-10 DIAGNOSIS — Z961 Presence of intraocular lens: Secondary | ICD-10-CM | POA: Diagnosis not present

## 2024-03-23 ENCOUNTER — Encounter: Payer: Self-pay | Admitting: Internal Medicine

## 2024-03-23 ENCOUNTER — Ambulatory Visit (INDEPENDENT_AMBULATORY_CARE_PROVIDER_SITE_OTHER): Payer: PPO | Admitting: Internal Medicine

## 2024-03-23 VITALS — BP 108/72 | HR 70 | Temp 97.5°F | Ht 67.0 in | Wt 128.4 lb

## 2024-03-23 DIAGNOSIS — R531 Weakness: Secondary | ICD-10-CM | POA: Diagnosis not present

## 2024-03-23 DIAGNOSIS — J479 Bronchiectasis, uncomplicated: Secondary | ICD-10-CM | POA: Diagnosis not present

## 2024-03-23 DIAGNOSIS — R053 Chronic cough: Secondary | ICD-10-CM

## 2024-03-23 DIAGNOSIS — N183 Chronic kidney disease, stage 3 unspecified: Secondary | ICD-10-CM | POA: Diagnosis not present

## 2024-03-23 DIAGNOSIS — R634 Abnormal weight loss: Secondary | ICD-10-CM | POA: Diagnosis not present

## 2024-03-23 MED ORDER — IPRATROPIUM-ALBUTEROL 0.5-2.5 (3) MG/3ML IN SOLN: 3.0000 mL | Freq: Four times a day (QID) | RESPIRATORY_TRACT | 3 refills | Status: AC | PRN

## 2024-03-23 MED ORDER — DOXYCYCLINE HYCLATE 100 MG PO TABS: 100.0000 mg | ORAL_TABLET | Freq: Two times a day (BID) | ORAL | 0 refills | Status: DC

## 2024-03-23 MED ORDER — HYDROCODONE BIT-HOMATROP MBR 5-1.5 MG/5ML PO SOLN: 5.0000 mL | Freq: Four times a day (QID) | ORAL | 0 refills | Status: DC | PRN

## 2024-03-23 MED ORDER — PSEUDOEPHEDRINE-GUAIFENESIN ER 60-600 MG PO TB12
1.0000 | ORAL_TABLET | Freq: Every day | ORAL | 3 refills | Status: AC
Start: 2024-03-23 — End: ?

## 2024-03-23 NOTE — Assessment & Plan Note (Addendum)
 A lot of mucus issues ?percussion vest Try Mucinex  D at HS and [facility] qid

## 2024-03-23 NOTE — Progress Notes (Signed)
 Subjective:  Patient ID: [name] Christensen, male    DOB: [birth year]  Age: 89 y.o. MRN: [id number]  CC: Medical Management of Chronic Issues (3 Month Follow Up. Patient notes issues with ongoing mucus/fluid build up in lungs due to ongoing bronchitis. Notes no shortness of breath or oxygen issues. No history of asthma. Treating with 12 hour mucinex  at night, 6 hour tablet in the morning and at lunch. Also using codeine . Recent hospital visit 03/28 post visit with Cardiologist where they had him get an x-ray, noted pneumonia) and Altered Mental Status (*Concerns from spouse*/Worsening balance, memory, confusion. Notes resenting any type of help. Stays in the chair most days, exhausted upon exertion)   HPI [name] Christensen presents for a lot mucus at night, DOE, fatigue, memory issues  Medical Management of Chronic Issues (3 Month Follow Up. Patient notes issues with ongoing mucus/fluid build up in lungs due to ongoing bronchitis. Notes no shortness of breath or oxygen issues. No history of asthma. Treating with 12 hour mucinex  at night, 6 hour tablet in the morning and at lunch. Also using codeine . Recent hospital visit 03/28 post visit with Cardiologist where they had him get an x-ray, noted pneumonia) and Altered Mental Status (*Concerns from spouse*/Worsening balance, memory, confusion. Notes resenting any type of help. Stays in the chair most days, exhausted upon exertion)  He is here w/son  Outpatient Medications Prior to Visit  Medication Sig Dispense Refill   acetaminophen  (TYLENOL ) 500 MG tablet Take 500 mg by mouth at bedtime.     alfuzosin  (UROXATRAL ) 10 MG 24 hr tablet Take 10 mg by mouth every evening.      apixaban  (ELIQUIS ) 2.5 MG TABS tablet Take 1 tablet (2.5 mg total) by mouth 2 (two) times daily. 180 tablet 3   ascorbic acid (VITAMIN C ) 250 MG tablet Take 250 mg by mouth daily.     atorvastatin  (LIPITOR) 40 MG tablet Take 20 mg by mouth daily.     b complex vitamins capsule Take 1  capsule by mouth daily. 100 capsule 3   betamethasone  dipropionate 0.05 % cream Apply 1 application. topically 2 (two) times daily as needed.     cinacalcet  (SENSIPAR ) 30 MG tablet Take 1 tablet (30 mg total) by mouth 3 (three) times a week. 25 tablet 0   cyanocobalamin  (VITAMIN B12) 1000 MCG tablet Take 1 tablet (1,000 mcg total) by mouth every other day as needed. 100 tablet 3   Dextromethorphan-guaiFENesin  (MUCINEX  DM PO) Take 30 mg by mouth in the morning and at bedtime.     dofetilide  (TIKOSYN ) 125 MCG capsule Take 1 capsule (125 mcg total) by mouth 2 (two) times daily. 180 capsule 2   donepezil  (ARICEPT ) 5 MG tablet TAKE ONE TABLET BY MOUTH EVERY NIGHT AT BEDTIME 90 tablet 3   dutasteride  (AVODART ) 0.5 MG capsule Take 0.5 mg by mouth every evening.      escitalopram  (LEXAPRO ) 10 MG tablet Take 1 tablet (10 mg total) by mouth at bedtime. 90 tablet 1   famotidine  (PEPCID ) 20 MG tablet Take 20 mg by mouth daily.     guaiFENesin  (MUCINEX ) 600 MG 12 hr tablet Take 600 mg by mouth as needed for cough.     ipratropium (ATROVENT) 0.03 % nasal spray Place 2 sprays into both nostrils 3 (three) times daily.     Multiple Vitamin (MULTIVITAMIN WITH MINERALS) TABS tablet Take 1 tablet by mouth every other day.     nitroGLYCERIN  (NITROSTAT ) 0.4 MG SL tablet DISSOLVE  1 TABLET UNDER TONGUE AS NEEDEDFOR CHEST PAIN. MAY REPEAT 5 MINUTES APART 3 TIMES IF NEEDED 25 tablet 3   omeprazole (PRILOSEC) 20 MG capsule Take 20 mg by mouth daily.     Plecanatide  3 MG TABS Take 3 mg by mouth daily. Replaces (Linaclotide ) Linzess      Polyethyl Glycol-Propyl Glycol (SYSTANE OP) Place 1 drop into both eyes daily as needed (burning eyes).      triamcinolone  cream (KENALOG ) 0.1 % APPLY 1 APPLICATION TOPICALLY TWO TIMES DAILY AS NEEDED AVOIDING FACE, GROIN ANDARMPITS (Patient taking differently: Apply 1 Application topically 2 (two) times daily.) 80 g 0   HYDROcodone  bit-homatropine (HYCODAN) 5-1.5 MG/5ML syrup TAKE FIVE MLS (1  TEASPOONFUL) BY MOUTH EVERY EIGHT HOURS AS NEEDED FOR COUGH. 240 mL 0   iron  polysaccharides (NIFEREX) 150 MG capsule Take 1 capsule (150 mg total) by mouth daily. 30 capsule 2   metoprolol  succinate (TOPROL  XL) 25 MG 24 hr tablet Take 1 tablet (25 mg total) by mouth daily. 30 tablet 0   No facility-administered medications prior to visit.    ROS: Review of Systems  Constitutional:  Negative for appetite change, fatigue and unexpected weight change.  HENT:  Negative for congestion, nosebleeds, sneezing, sore throat and trouble swallowing.   Eyes:  Negative for itching and visual disturbance.  Respiratory:  Positive for cough, shortness of breath and wheezing.   Cardiovascular:  Negative for chest pain, palpitations and leg swelling.  Gastrointestinal:  Negative for abdominal distention, blood in stool, diarrhea and nausea.  Genitourinary:  Negative for frequency and hematuria.  Musculoskeletal:  Negative for back pain, gait problem, joint swelling and neck pain.  Skin:  Negative for rash.  Neurological:  Negative for dizziness, tremors, speech difficulty, weakness and headaches.  Hematological:  Bruises/bleeds easily.  Psychiatric/Behavioral:  Positive for decreased concentration. Negative for agitation, dysphoric mood, sleep disturbance and suicidal ideas. The patient is not nervous/anxious.     Objective:  BP 108/72   Pulse 70   Temp (!) 97.5 F (36.4 C)   Ht 5\' 7"  (1.702 m)   Wt 128 lb 6.4 oz (58.2 kg)   SpO2 98%   BMI 20.11 kg/m   BP Readings from Last 3 Encounters:  03/23/24 108/72  03/04/24 124/66  03/01/24 (!) 142/68    Wt Readings from Last 3 Encounters:  03/23/24 128 lb 6.4 oz (58.2 kg)  03/04/24 130 lb (59 kg)  03/01/24 128 lb 6.4 oz (58.2 kg)    Physical Exam Constitutional:      General: He is not in acute distress.    Appearance: Normal appearance. He is well-developed.     Comments: NAD  Eyes:     Conjunctiva/sclera: Conjunctivae normal.     Pupils:  Pupils are equal, round, and reactive to light.  Neck:     Thyroid : No thyromegaly.     Vascular: No JVD.  Cardiovascular:     Rate and Rhythm: Normal rate and regular rhythm.     Heart sounds: Normal heart sounds. No murmur heard.    No friction rub. No gallop.  Pulmonary:     Effort: Pulmonary effort is normal. No respiratory distress.     Breath sounds: Normal breath sounds. No wheezing or rales.  Chest:     Chest wall: No tenderness.  Abdominal:     General: Bowel sounds are normal. There is no distension.     Palpations: Abdomen is soft. There is no mass.     Tenderness: There is  no abdominal tenderness. There is no guarding or rebound.  Musculoskeletal:        General: No tenderness. Normal range of motion.     Cervical back: Normal range of motion.  Lymphadenopathy:     Cervical: No cervical adenopathy.  Skin:    General: Skin is warm and dry.     Findings: No rash.  Neurological:     Mental Status: He is alert and oriented to person, place, and time.     Cranial Nerves: No cranial nerve deficit.     Motor: No abnormal muscle tone.     Coordination: Coordination normal.     Gait: Gait normal.     Deep Tendon Reflexes: Reflexes are normal and symmetric.  Psychiatric:        Behavior: Behavior normal.        Thought Content: Thought content normal.        Judgment: Judgment normal.   Hard hearing  Lab Results  Component Value Date   WBC 5.1 02/20/2024   HGB 10.8 (L) 02/20/2024   HCT 33.2 (L) 02/20/2024   PLT 158 02/20/2024   GLUCOSE 106 (H) 02/20/2024   CHOL 93 09/30/2022   TRIG 113.0 09/30/2022   HDL 44.80 09/30/2022   LDLCALC 26 09/30/2022   ALT 13 12/25/2023   AST 25 12/25/2023   NA 134 (L) 02/20/2024   K 4.3 02/20/2024   CL 98 02/20/2024   CREATININE 1.27 (H) 02/20/2024   BUN 20 02/20/2024   CO2 26 02/20/2024   TSH 2.231 02/20/2024   PSA 12.45 (H) 02/06/2016   INR 1.1 04/02/2017   HGBA1C 6.1 12/30/2019    CT Chest Wo Contrast Result Date:  02/20/2024 CLINICAL DATA:  Chronic dyspnea.  Cardiac PET CT 01/22/2024. EXAM: CT CHEST WITHOUT CONTRAST TECHNIQUE: Multidetector CT imaging of the chest was performed following the standard protocol without IV contrast. RADIATION DOSE REDUCTION: This exam was performed according to the departmental dose-optimization program which includes automated exposure control, adjustment of the mA and/or kV according to patient size and/or use of iterative reconstruction technique. COMPARISON:  CT angiogram chest 11/20/2022. FINDINGS: Cardiovascular: Heart is mildly enlarged. There is no pericardial effusion. Aorta is normal in size. Left-sided pacemaker is present. Mediastinum/Nodes: There are calcified subcarinal and right hilar lymph nodes. No enlarged lymph nodes are seen. Visualized esophagus and thyroid  gland are within normal limits. Lungs/Pleura: Examination is limited secondary to respiratory motion artifact. There are minimal tree-in-bud opacities in the left upper lobe. Peripheral reticular opacities are seen in the bilateral lower lungs/right greater than left. There is mild patchy airspace opacity in the right middle lobe. No pleural effusion or pneumothorax. No ground-glass opacities. There is a ground-glass nodule in the left lower lobe image 5/82 measuring 7 mm which is unchanged from 2023 study. Upper Abdomen: No acute abnormality. Musculoskeletal: Sternotomy wires are present.  No acute fracture. IMPRESSION: 1. Mild patchy airspace opacity in the right middle lobe may represent atelectasis or infection. 2. Minimal tree-in-bud opacities in the left upper lobe may be infectious/inflammatory. 3. Peripheral reticular opacities in the bilateral lower lungs/right greater than left, likely fibrotic changes. 4. Stable 7 mm ground-glass nodule in the left lower lobe unchanged from 2023. Continued follow-up up by CT is recommended in 12 months, with continued annual surveillance for a minimum of 3 years to exclude  adenocarcinoma. recommendations are taken from: Recommendations for the Management of Subsolid Pulmonary Nodules Detected at CT: A Statement from the [employer] Society Radiology 2013; 266:1,  295-284. 5. Mild cardiomegaly. Electronically Signed   By: Tyron Gallon M.D.   On: 02/20/2024 18:05    Assessment & Plan:   Problem List Items Addressed This Visit     Cough - Primary   Refractory and chronic -worse now The only thing that helps is Hycodan cough syrup.  He has been using it with caution  Potential benefits of a long term opioids use as well as potential risks (i.e. addiction risk, apnea etc) and complications (i.e. Somnolence, constipation and others) were explained to the patient and were aknowledged.      Weakness   CFS, multifactorial Labs Discussed      Weight loss   Wt Readings from Last 3 Encounters:  03/23/24 128 lb 6.4 oz (58.2 kg)  03/04/24 130 lb (59 kg)  03/01/24 128 lb 6.4 oz (58.2 kg)         Bronchiectasis (HCC) assoc with possible MAI   A lot of mucus issues ?percussion vest Try Mucinex  D at HS and Douoneb HHN qid      Chronic renal insufficiency, stage 3 (moderate) (HCC)   Hydrate well Check CMET         Meds ordered this encounter  Medications   pseudoephedrine-guaifenesin  (MUCINEX  D) 60-600 MG 12 hr tablet    Sig: Take 1 tablet by mouth at bedtime.    Dispense:  30 tablet    Refill:  3   ipratropium-albuterol  (DUONEB) 0.5-2.5 (3) MG/3ML SOLN    Sig: Take 3 mLs by nebulization every 6 (six) hours as needed.    Dispense:  360 mL    Refill:  3   HYDROcodone  bit-homatropine (HYCODAN) 5-1.5 MG/5ML syrup    Sig: Take 5 mLs by mouth every 6 (six) hours as needed for cough.    Dispense:  240 mL    Refill:  0   doxycycline  (VIBRA -TABS) 100 MG tablet    Sig: Take 1 tablet (100 mg total) by mouth 2 (two) times daily.    Dispense:  60 tablet    Refill:  0      Follow-up: Return in about 2 months (around 05/23/2024) for a follow-up  visit.  Anitra Barn, MD

## 2024-03-23 NOTE — Assessment & Plan Note (Signed)
 Wt Readings from Last 3 Encounters:  03/23/24 128 lb 6.4 oz (58.2 kg)  03/04/24 130 lb (59 kg)  03/01/24 128 lb 6.4 oz (58.2 kg)

## 2024-03-23 NOTE — Assessment & Plan Note (Signed)
 CFS, multifactorial Labs Discussed

## 2024-03-23 NOTE — Assessment & Plan Note (Signed)
 Refractory and chronic -worse now The only thing that helps is Hycodan cough syrup.  He has been using it with caution  Potential benefits of a long term opioids use as well as potential risks (i.e. addiction risk, apnea etc) and complications (i.e. Somnolence, constipation and others) were explained to the patient and were aknowledged.

## 2024-03-23 NOTE — Assessment & Plan Note (Signed)
 Hydrate well Check CMET

## 2024-03-27 ENCOUNTER — Other Ambulatory Visit: Payer: Self-pay | Admitting: Internal Medicine

## 2024-03-30 ENCOUNTER — Ambulatory Visit: Payer: PPO

## 2024-03-30 VITALS — Ht 66.0 in | Wt 128.0 lb

## 2024-03-30 DIAGNOSIS — Z0000 Encounter for general adult medical examination without abnormal findings: Secondary | ICD-10-CM

## 2024-03-30 NOTE — Progress Notes (Addendum)
 Subjective:   [name] Christensen is a 89 y.o. who presents for a Medicare Wellness preventive visit.  Visit Complete: Virtual I connected with  [name] Christensen on 03/30/24 by a audio enabled telemedicine application and verified that I am speaking with the correct person using two identifiers.  Patient Location: Home  Provider Location: Office/Clinic  I discussed the limitations of evaluation and management by telemedicine. The patient expressed understanding and agreed to proceed.  Vital Signs: Because this visit was a virtual/telehealth visit, some criteria may be missing or patient reported. Any vitals not documented were not able to be obtained and vitals that have been documented are patient reported.  VideoDeclined- This patient declined Librarian, academic. Therefore the visit was completed with audio only.  Persons Participating in Visit: Patient.  AWV Questionnaire: No: Patient Medicare AWV questionnaire was not completed prior to this visit.  Cardiac Risk Factors include: advanced age (>82men, >29 women);male gender;hypertension;dyslipidemia     Objective:    Today's Vitals   03/30/24 0848  Weight: 128 lb (58.1 kg)  Height: 5\' 6"  (1.676 m)   Body mass index is 20.66 kg/m.     03/30/2024    8:44 AM 07/12/2023    7:27 AM 06/29/2023   10:30 AM 03/27/2023    9:38 AM 01/12/2023   12:52 PM 12/27/2022   12:00 AM 12/26/2022    6:22 PM  Advanced Directives  Does Patient Have a Medical Advance Directive? Yes Yes Yes Yes No No No  Type of Estate agent of [place];Living will  Healthcare Power of [employer] of [place];Living will     Copy of Healthcare Power of Attorney in Chart? No - copy requested   No - copy requested     Would patient like information on creating a medical advance directive?      No - Patient declined     Current Medications (verified) Outpatient Encounter Medications as of 03/30/2024  Medication  Sig   acetaminophen  (TYLENOL ) 500 MG tablet Take 500 mg by mouth at bedtime.   alfuzosin  (UROXATRAL ) 10 MG 24 hr tablet Take 10 mg by mouth every evening.    apixaban  (ELIQUIS ) 2.5 MG TABS tablet Take 1 tablet (2.5 mg total) by mouth 2 (two) times daily.   ascorbic acid (VITAMIN C ) 250 MG tablet Take 250 mg by mouth daily.   atorvastatin  (LIPITOR) 40 MG tablet Take 20 mg by mouth daily.   b complex vitamins capsule Take 1 capsule by mouth daily.   betamethasone  dipropionate 0.05 % cream Apply 1 application. topically 2 (two) times daily as needed.   cinacalcet  (SENSIPAR ) 30 MG tablet Take 1 tablet (30 mg total) by mouth 3 (three) times a week.   cyanocobalamin  (VITAMIN B12) 1000 MCG tablet Take 1 tablet (1,000 mcg total) by mouth every other day as needed.   Dextromethorphan-guaiFENesin  (MUCINEX  DM PO) Take 30 mg by mouth in the morning and at bedtime.   dofetilide  (TIKOSYN ) 125 MCG capsule Take 1 capsule (125 mcg total) by mouth 2 (two) times daily.   donepezil  (ARICEPT ) 5 MG tablet TAKE ONE TABLET BY MOUTH EVERY NIGHT AT BEDTIME   doxycycline  (VIBRA -TABS) 100 MG tablet Take 1 tablet (100 mg total) by mouth 2 (two) times daily.   dutasteride  (AVODART ) 0.5 MG capsule Take 0.5 mg by mouth every evening.    escitalopram  (LEXAPRO ) 10 MG tablet Take 1 tablet (10 mg total) by mouth at bedtime.   famotidine  (PEPCID ) 20 MG  tablet Take 20 mg by mouth daily.   guaiFENesin  (MUCINEX ) 600 MG 12 hr tablet Take 600 mg by mouth as needed for cough.   HYDROcodone  bit-homatropine (HYCODAN) 5-1.5 MG/5ML syrup Take 5 mLs by mouth every 6 (six) hours as needed for cough.   ipratropium (ATROVENT) 0.03 % nasal spray Place 2 sprays into both nostrils 3 (three) times daily.   ipratropium-albuterol  (DUONEB) 0.5-2.5 (3) MG/3ML SOLN Take 3 mLs by nebulization every 6 (six) hours as needed.   metoprolol  succinate (TOPROL -XL) 25 MG 24 hr tablet TAKE 1 TABLET BY MOUTH ONCE A DAY   Multiple Vitamin (MULTIVITAMIN WITH  MINERALS) TABS tablet Take 1 tablet by mouth every other day.   nitroGLYCERIN  (NITROSTAT ) 0.4 MG SL tablet DISSOLVE 1 TABLET UNDER TONGUE AS NEEDEDFOR CHEST PAIN. MAY REPEAT 5 MINUTES APART 3 TIMES IF NEEDED   omeprazole (PRILOSEC) 20 MG capsule Take 20 mg by mouth daily.   Plecanatide  3 MG TABS Take 3 mg by mouth daily. Replaces (Linaclotide ) Linzess    Polyethyl Glycol-Propyl Glycol (SYSTANE OP) Place 1 drop into both eyes daily as needed (burning eyes).    pseudoephedrine -guaifenesin  (MUCINEX  D) 60-600 MG 12 hr tablet Take 1 tablet by mouth at bedtime.   triamcinolone  cream (KENALOG ) 0.1 % APPLY 1 APPLICATION TOPICALLY TWO TIMES DAILY AS NEEDED AVOIDING FACE, GROIN ANDARMPITS (Patient taking differently: Apply 1 Application topically 2 (two) times daily.)   iron  polysaccharides (NIFEREX) 150 MG capsule Take 1 capsule (150 mg total) by mouth daily.   No facility-administered encounter medications on file as of 03/30/2024.    Allergies (verified) Ezetimibe, Penicillins, Pravastatin sodium, Rosuvastatin, and Niacin   History: Past Medical History:  Diagnosis Date   Actinic keratosis 06/09/2019   L sup forehead at hairline - bx proven   AICD (automatic cardioverter/defibrillator) present 04/07/2017   Allergy    Anxiety    BPH (benign prostatic hypertrophy)    elevated PSA Dr. Lige Christensen Bx 2010   CAP (community acquired pneumonia) 11/06/2012   rx levaquin  12/14/12  - esr 51 12/31/2012 and no eos  05/2021 likely a COVID complication.  Prescribed cefdinir  antibiotic, cough syrup with codeine , Medrol  Dosepak  1/23 CAP --sx's - 80+% better. CXR: COPD changes with chronic basilar interstitial disease and suspect  superimposed acute infiltrate at RIGHT base.   Cardiac arrest [facility])    a. out-of-hospital arrest 10/31/2012 - EF 40-45%, patent grafts on cath, received [employer] AICD.   Carotid disease, bilateral (HCC)    a. 0-39% by doppers.   Cataract    bil cataracts removed   Chest pain, atypical  07/21/2013   CHF (congestive heart failure) (HCC)    Chills with fever 11/24/2017   12/18 Recurrent - ?prostatitis  1/19 ?URI  6/20    Chronic calculous cholecystitis 06/05/2017   Chronic diastolic CHF (congestive heart failure) (HCC) 03/09/2018   10/19 Probable pacemaker cardiomyopathy per Dr Rodolfo Clan w/a plan to   1) INCREASE cozaar  (losartan ) 50 mg - take 1 tablet by mouth once daily - d/c. Losartan  was stopped on 05/12/19 due to high K  2) START mexiletine 150 mg- take 1 tablet by mouth TWICE daily   Coronary artery disease    a. s/p MI/CABG 2005. b. s/p cath at time of VF arrest 10/2013 - grafts patent.   Cough    Diverticulosis    Elevated LFTs    a. 10/2012 felt due to cardiac arrest - Hepatitis C Ab reactive from 11/01/12>>Hep C RNA PCR negative 11/04/12.  GERD (gastroesophageal reflux disease)    Hyperlipidemia    Hypertension    Internal hemorrhoids    Myocardial infarction (HCC)    2005 - CABG x 5 2005   Paroxysmal atrial fibrillation (HCC)    Continue on Eliquis , Toprol , Tikosyn   Dr Rodolfo Clan   RBBB    Tubular adenoma of colon    Unstable angina (HCC)    Upper airway cough syndrome 03/26/2022   Onset ? Jan 2023   - try off Entresto   03/25/2022   - max gerd rx 08/01/2022 >>>       Ventricular bigeminy    a. Event monitor 01/2013: NSR with PVCs and occ bigeminy.   Weakness 04/09/2016   5/17 ?post-viral vs other, 3/18, 6/20, 1/23  CFS  He is very active for his age  34/23  Post-concussion  Take Lexapro  and Toprol  qhs  Reduce Toprol  to 1 qhs   Past Surgical History:  Procedure Laterality Date   APPENDECTOMY     CARDIAC CATHETERIZATION  2007   with patent graft anatomy atretic left internal mammary  artery to the LAD which is nonobstructive. Will restart study June 08, 2007   CARDIOVERSION N/A 03/27/2018   Procedure: CARDIOVERSION;  Surgeon: Wenona Hamilton, MD;  Location: [facility] ORS;  Service: Cardiovascular;  Laterality: N/A;   CARDIOVERSION N/A 01/04/2019   Procedure: CARDIOVERSION  (CATH LAB);  Surgeon: Wenona Hamilton, MD;  Location: [facility] ORS;  Service: Cardiovascular;  Laterality: N/A;   CARDIOVERSION N/A 07/07/2019   Procedure: CARDIOVERSION;  Surgeon: Loyde Rule, MD;  Location: [facility] ENDOSCOPY;  Service: Cardiovascular;  Laterality: N/A;   CATARACT EXTRACTION W/PHACO Right 04/23/2016   Procedure: CATARACT EXTRACTION PHACO AND INTRAOCULAR LENS PLACEMENT (IOC);  Surgeon: Clair Crews, MD;  Location: [facility] ORS;  Service: Ophthalmology;  Laterality: Right;  US  1.09 AP% 19.3 CDE 13.38 Fluid pack lot # [id number]   CHOLECYSTECTOMY N/A 06/05/2017   Procedure: LAPAROSCOPIC CHOLECYSTECTOMY WITH INTRAOPERATIVE CHOLANGIOGRAM ERAS PATHWAY POSSIBLE NEEDLE CORE BIOPSY OF LIVER;  Surgeon: Candyce Champagne, MD;  Location: [facility] OR;  Service: General;  Laterality: N/A;  ERAS PATHWAY   COLONOSCOPY     CORONARY ARTERY BYPASS GRAFT  2005   EYE SURGERY     ICD GENERATOR CHANGEOUT N/A 04/07/2017   Procedure: ICD Generator Changeout;  Surgeon: Verona Goodwill, MD;  Location: [facility] INVASIVE CV LAB;  Service: Cardiovascular;  Laterality: N/A;   IMPLANTABLE CARDIOVERTER DEFIBRILLATOR IMPLANT N/A 11/11/2012   STJ single chamber ICD implanted by Dr Rodolfo Clan for cardiac arrest    INGUINAL HERNIA REPAIR Bilateral 01/17/2015   Procedure: BILATERAL LAPAROSCOPIC INGUINAL HERNIA REPAIR WITH LEFT FEMORAL HERNIA REPAIR;  Surgeon: Candyce Champagne, MD;  Location: [facility] OR;  Service: General;  Laterality: Bilateral;   INSERTION OF MESH Bilateral 01/17/2015   Procedure: INSERTION OF MESH;  Surgeon: Candyce Champagne, MD;  Location: [facility] OR;  Service: General;  Laterality: Bilateral;   IR CATHETER TUBE CHANGE  12/27/2022   LAPAROSCOPIC CHOLECYSTECTOMY  06/05/2017   LEAD INSERTION N/A 04/07/2017   Procedure: RA Lead Insertion;  Surgeon: Verona Goodwill, MD;  [facility] INVASIVE CV LAB;  Service: Cardiovascular;  Laterality: N/A;   LEAD REVISION/REPAIR N/A 04/08/2017   Procedure: Atrial Lead Revision/Repair;  Surgeon: Verona Goodwill, MD;  Location: [facility] INVASIVE CV LAB;  Service: Cardiovascular;  Laterality: N/A;   LEFT HEART CATH AND CORONARY ANGIOGRAPHY Left 05/14/2019   Procedure: LEFT HEART CATH AND CORONARY ANGIOGRAPHY;  Surgeon: Wenona Hamilton, MD;  Location: [facility] INVASIVE  CV LAB;  Service: Cardiovascular;  Laterality: Left;   LEFT HEART CATHETERIZATION WITH CORONARY/GRAFT ANGIOGRAM N/A 11/10/2012   Procedure: LEFT HEART CATHETERIZATION WITH Estella Helling;  Surgeon: [name], MD;  Location: [facility] CATH LAB;  Service: Cardiovascular;  Laterality: N/A;   LIVER BIOPSY N/A 06/05/2017   Procedure: SINGLE SITE LIVER BIOPSY ERAS PATHWAY;  Surgeon: Candyce Champagne, MD;  Location: [facility] OR;  Service: General;  Laterality: N/A;  ERAS PATHWAY   LUMBAR FUSION  09/2007   Family History  Problem Relation Age of Onset   Coronary artery disease Mother    Heart attack Brother    Stomach cancer Neg Hx    Colon cancer Neg Hx    Esophageal cancer Neg Hx    Pancreatic cancer Neg Hx    Prostate cancer Neg Hx    Rectal cancer Neg Hx    Hypercalcemia Neg Hx    Social History   Socioeconomic History   Marital status: Married    Spouse name: Not on file   Number of children: 5   Years of education: Not on file   Highest education level: Not on file  Occupational History   Occupation: Engineer, 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  Tobacco Use   Smoking status: Never    Passive exposure: Past   Smokeless tobacco: Never  Vaping Use   Vaping status: Never Used  Substance and Sexual Activity   Alcohol  use: No   Drug use: No   Sexual activity: Not Currently  Other Topics Concern   Not on file  Social History Narrative   Married   Social Drivers of Health   Financial Resource Strain: Low Risk  (03/30/2024)   Overall Financial Resource Strain (CARDIA)    Difficulty of Paying Living Expenses: Not hard at all  Food Insecurity: No Food Insecurity (03/30/2024)   Hunger Vital Sign    Worried About Running Out of Food in the  Last Year: Never true    Ran Out of Food in the Last Year: Never true  Transportation Needs: No Transportation Needs (03/30/2024)   PRAPARE - Administrator, Civil Service (Medical): No    Lack of Transportation (Non-Medical): No  Physical Activity: Sufficiently Active (03/30/2024)   Exercise Vital Sign    Days of Exercise per Week: 6 days    Minutes of Exercise per Session: 40 min  Stress: No Stress Concern Present (03/30/2024)   [employer] of Occupational Health - Occupational Stress Questionnaire    Feeling of Stress : Not at all  Social Connections: Socially Integrated (03/30/2024)   Social Connection and Isolation Panel [NHANES]    Frequency of Communication with Friends and Family: More than three times a week    Frequency of Social Gatherings with Friends and Family: More than three times a week    Attends Religious Services: More than 4 times per year    Active Member of [employer] or Organizations: Yes    Attends Engineer, structural: More than 4 times per year    Marital Status: Married    Tobacco Counseling Counseling given: No    Clinical Intake:  Pre-visit preparation completed: Yes  Pain : No/denies pain     BMI - recorded: 20.66 Nutritional Status: BMI of 19-24  Normal Nutritional Risks: None Diabetes: No  Lab Results  Component Value Date   HGBA1C 6.1 12/30/2019   HGBA1C 6.4 10/10/2017   HGBA1C 6.3 (H) 10/31/2012     How often do you  need to have someone help you when you read instructions, pamphlets, or other written materials from your doctor or pharmacy?: 1 - Never  Interpreter Needed?: No  Information entered by :: Kandy Orris, CMA   Activities of Daily Living     03/30/2024    8:50 AM  In your present state of health, do you have any difficulty performing the following activities:  Hearing? 0  Vision? 0  Difficulty concentrating or making decisions? 0  Walking or climbing stairs? 1  Dressing or bathing? 0  Doing  errands, shopping? 0  Preparing Food and eating ? N  Using the Toilet? N  In the past six months, have you accidently leaked urine? N  Do you have problems with loss of bowel control? N  Managing your Medications? N  Managing your Finances? N  Housekeeping or managing your Housekeeping? N    Patient Care Team: Plotnikov, Oakley Bellman, MD as PCP - General (Internal Medicine) Loyde Rule, MD as PCP - Cardiology (Cardiology) Verona Goodwill, MD as PCP - Electrophysiology (Cardiology) [name] Christensen Maren Shank., MD (Urology) Candyce Champagne, MD as Consulting Physician (General Surgery) Pyrtle, Amber Bail, MD as Consulting Physician (Gastroenterology) Clair Crews, MD as Referring Physician (Ophthalmology) Diamond Formica, MD as Consulting Physician (Pulmonary Disease) Gwyndolyn Lerner, MD (Inactive) as Consulting Physician (Endocrinology) Adelaida Holts, MD as Referring Physician (Specialist)  Indicate any recent Medical Services you may have received from other than [facility] providers in the past year (date may be approximate).     Assessment:   This is a routine wellness examination for [employer].  Hearing/Vision screen Hearing Screening - Comments:: Wears hearing aids Vision Screening - Comments:: Wears rx glasses - up to date with routine eye exams with Dr Merrell Abate   Goals Addressed               This Visit's Progress     Patient Stated (pt-stated)        Patient stated he will continue to exercise and stay consistent.       Depression Screen     03/30/2024    8:57 AM 12/25/2023    2:26 PM 09/17/2023   10:55 AM 07/10/2023   10:17 AM 03/27/2023    9:40 AM 02/20/2023   10:30 AM 01/20/2023   10:41 AM  PHQ 2/9 Scores  PHQ - 2 Score 2 0 0 0 0 0 0  PHQ- 9 Score 6    1      Fall Risk     03/30/2024    8:52 AM 12/25/2023    2:26 PM 09/17/2023   10:55 AM 07/10/2023   10:17 AM 04/01/2023   11:24 AM  Fall Risk   Falls in the past year? 1 0 0 0 1  Number falls in past yr: 0 0 0 0    Comment 1      Injury with Fall? 0 0 0 0   Risk for fall due to : History of fall(s);Impaired balance/gait No Fall Risks No Fall Risks No Fall Risks   Follow up Falls evaluation completed;Falls prevention discussed Falls evaluation completed Falls evaluation completed Falls evaluation completed     MEDICARE RISK AT HOME:  Medicare Risk at Home Any stairs in or around the home?: Yes If so, are there any without handrails?: No Home free of loose throw rugs in walkways, pet beds, electrical cords, etc?: Yes Adequate lighting in your home to reduce risk of falls?: Yes Life alert?: No Use  of a cane, walker or w/c?: Yes (walker) Grab bars in the bathroom?: No Shower chair or bench in shower?: Yes Elevated toilet seat or a handicapped toilet?: Yes  TIMED UP AND GO:  Was the test performed?  No  Cognitive Function: 6CIT completed    04/02/2022   11:04 AM  MMSE - Mini Mental State Exam  Orientation to time 5  Orientation to Place 5  Registration 3  Attention/ Calculation 5  Recall 3  Language- name 2 objects 2  Language- repeat 1  Language- follow 3 step command 3  Language- read & follow direction 1  Write a sentence 1  Copy design 1  Total score 30        03/30/2024    8:54 AM 03/27/2023    9:40 AM  6CIT Screen  What Year? 0 points 0 points  What month? 0 points 0 points  What time? 0 points 0 points  Count back from 20 0 points 0 points  Months in reverse 0 points 0 points  Repeat phrase 4 points 0 points  Total Score 4 points 0 points    Immunizations Immunization History  Administered Date(s) Administered   Fluad Quad(high Dose 65+) 09/29/2019, 08/21/2020, 09/25/2021, 08/19/2022   Fluad Trivalent(High Dose 65+) 09/17/2023   Influenza Whole 09/07/2008, 12/11/2009, 08/26/2011, 09/25/2012   Influenza, High Dose Seasonal PF 08/05/2016, 08/11/2017, 08/17/2018   Influenza,inj,Quad PF,6+ Mos 08/30/2013, 08/03/2014, 08/09/2015   PFIZER Comirnaty(Gray Top)Covid-19  Tri-Sucrose Vaccine 05/24/2021   PFIZER(Purple Top)SARS-COV-2 Vaccination 12/27/2019, 01/17/2020, 08/21/2020   Pneumococcal Conjugate-13 01/31/2014   Pneumococcal Polysaccharide-23 09/08/2007, 06/04/2016   Td 09/09/2001, 12/26/2011   Tdap 08/17/2018, 08/07/2022   Zoster Recombinant(Shingrix) 08/04/2018, 11/05/2018    Screening Tests Health Maintenance  Topic Date Due   COVID-19 Vaccine (5 - 2024-25 season) 04/15/2024 (Originally 07/27/2023)   INFLUENZA VACCINE  06/25/2024   Medicare Annual Wellness (AWV)  03/30/2025   DTaP/Tdap/Td (5 - Td or Tdap) 08/07/2032   Pneumonia Vaccine 58+ Years old  Completed   Zoster Vaccines- Shingrix  Completed   HPV VACCINES  Aged Out   Meningococcal B Vaccine  Aged Out    Health Maintenance  There are no preventive care reminders to display for this patient.  Health Maintenance Items Addressed: 03/30/2024  Pt has an appt at the [facility] in [place], [place] to get an up-to-date (annual) hearing exam (wears hearing aids) on 04/23/2024.  Additional Screening:  Vision Screening: Recommended annual ophthalmology exams for early detection of glaucoma and other disorders of the eye.  Pt stated he'd seen Dr Merrell Abate in 02/2024.  Dental Screening: Recommended annual dental exams for proper oral hygiene  Community Resource Referral / Chronic Care Management: CRR required this visit?  No   CCM required this visit?  No     Plan:     I have personally reviewed and noted the following in the patient's chart:   Medical and social history Use of alcohol , tobacco or illicit drugs  Current medications and supplements including opioid prescriptions. Patient is currently taking opioid prescriptions. Information provided to patient regarding non-opioid alternatives. Patient advised to discuss non-opioid treatment plan with their provider. Functional ability and status Nutritional status Physical activity Advanced directives List of other  physicians Hospitalizations, surgeries, and ER visits in previous 12 months Vitals Screenings to include cognitive, depression, and falls Referrals and appointments  In addition, I have reviewed and discussed with patient certain preventive protocols, quality metrics, and best practice recommendations. A written personalized care plan for  preventive services as well as general preventive health recommendations were provided to patient.     Patria Bookbinder, CMA   03/30/2024   After Visit Summary: (MyChart) Due to this being a telephonic visit, the after visit summary with patients personalized plan was offered to patient via MyChart   Notes: Nothing significant to report at this time.  Medical screening examination/treatment/procedure(s) were performed by non-physician practitioner and as supervising physician I was immediately available for consultation/collaboration.  I agree with above. Adelaide Holy, MD

## 2024-03-30 NOTE — Patient Instructions (Signed)
 [name] Christensen , Thank you for taking time to come for your Medicare Wellness Visit. I appreciate your ongoing commitment to your health goals. Please review the following plan we discussed and let me know if I can assist you in the future.   Referrals/Orders/Follow-Ups/Clinician Recommendations: Aim for 30 minutes of exercise or brisk walking, 6-8 glasses of water, and 5 servings of fruits and vegetables each day.   This is a list of the screening recommended for you and due dates:  Health Maintenance  Topic Date Due   COVID-19 Vaccine (5 - 2024-25 season) 04/15/2024*   Flu Shot  06/25/2024   Medicare Annual Wellness Visit  03/30/2025   DTaP/Tdap/Td vaccine (5 - Td or Tdap) 08/07/2032   Pneumonia Vaccine  Completed   Zoster (Shingles) Vaccine  Completed   HPV Vaccine  Aged Out   Meningitis B Vaccine  Aged Out  *Topic was postponed. The date shown is not the original due date.    Advanced directives: (Copy Requested) Please bring a copy of your health care power of attorney and living will to the office to be added to your chart at your convenience. You can mail to [facility] [address] [place], [place] [address] or email to [email or web address]  Next Medicare Annual Wellness Visit scheduled for next year: Yes  Managing Pain Without Opioids Opioids are strong medicines used to treat moderate to severe pain. For some people, especially those who have long-term (chronic) pain, opioids may not be the best choice for pain management due to: Side effects like nausea, constipation, and sleepiness. The risk of addiction (opioid use disorder). The longer you take opioids, the greater your risk of addiction. Pain that lasts for more than 3 months is called chronic pain. Managing chronic pain usually requires more than one approach and is often provided by a team of health care providers working together (multidisciplinary approach). Pain management may be done at a pain  management center or pain clinic. How to manage pain without the use of opioids Use non-opioid medicines Non-opioid medicines for pain may include: Over-the-counter or prescription non-steroidal anti-inflammatory drugs (NSAIDs). These may be the first medicines used for pain. They work well for muscle and bone pain, and they reduce swelling. Acetaminophen . This over-the-counter medicine may work well for milder pain but not swelling. Antidepressants. These may be used to treat chronic pain. A certain type of antidepressant (tricyclics) is often used. These medicines are given in lower doses for pain than when used for depression. Anticonvulsants. These are usually used to treat seizures but may also reduce nerve (neuropathic) pain. Muscle relaxants. These relieve pain caused by sudden muscle tightening (spasms). You may also use a pain medicine that is applied to the skin as a patch, cream, or gel (topical analgesic), such as a numbing medicine. These may cause fewer side effects than medicines taken by mouth. Do certain therapies as directed Some therapies can help with pain management. They include: Physical therapy. You will do exercises to gain strength and flexibility. A physical therapist may teach you exercises to move and stretch parts of your body that are weak, stiff, or painful. You can learn these exercises at physical therapy visits and practice them at home. Physical therapy may also involve: Massage. Heat wraps or applying heat or cold to affected areas. Electrical signals that interrupt pain signals (transcutaneous electrical nerve stimulation, TENS). Weak lasers that reduce pain and swelling (low-level laser therapy). Signals from your body that help you  learn to regulate pain (biofeedback). Occupational therapy. This helps you to learn ways to function at home and work with less pain. Recreational therapy. This involves trying new activities or hobbies, such as a physical  activity or drawing. Mental health therapy, including: Cognitive behavioral therapy (CBT). This helps you learn coping skills for dealing with pain. Acceptance and commitment therapy (ACT) to change the way you think and react to pain. Relaxation therapies, including muscle relaxation exercises and mindfulness-based stress reduction. Pain management counseling. This may be individual, family, or group counseling.  Receive medical treatments Medical treatments for pain management include: Nerve block injections. These may include a pain blocker and anti-inflammatory medicines. You may have injections: Near the spine to relieve chronic back or neck pain. Into joints to relieve back or joint pain. Into nerve areas that supply a painful area to relieve body pain. Into muscles (trigger point injections) to relieve some painful muscle conditions. A medical device placed near your spine to help block pain signals and relieve nerve pain or chronic back pain (spinal cord stimulation device). Acupuncture. Follow these instructions at home Medicines Take over-the-counter and prescription medicines only as told by your health care provider. If you are taking pain medicine, ask your health care providers about possible side effects to watch out for. Do not drive or use heavy machinery while taking prescription opioid pain medicine. Lifestyle  Do not use drugs or alcohol  to reduce pain. If you drink alcohol , limit how much you have to: 0-1 drink a day for women who are not pregnant. 0-2 drinks a day for men. Know how much alcohol  is in a drink. In the [place] one drink equals one 12 oz bottle of beer (355 mL), one 5 oz glass of wine (148 mL), or one 1 oz glass of hard liquor (44 mL). Do not use any products that contain nicotine or tobacco. These products include cigarettes, chewing tobacco, and vaping devices, such as e-cigarettes. If you need help quitting, ask your health care provider. Eat a healthy  diet and maintain a healthy weight. Poor diet and excess weight may make pain worse. Eat foods that are high in fiber. These include fresh fruits and vegetables, whole grains, and beans. Limit foods that are high in fat and processed sugars, such as fried and sweet foods. Exercise regularly. Exercise lowers stress and may help relieve pain. Ask your health care provider what activities and exercises are safe for you. If your health care provider approves, join an exercise class that combines movement and stress reduction. Examples include yoga and tai chi. Get enough sleep. Lack of sleep may make pain worse. Lower stress as much as possible. Practice stress reduction techniques as told by your therapist. General instructions Work with all your pain management providers to find the treatments that work best for you. You are an important member of your pain management team. There are many things you can do to reduce pain on your own. Consider joining an online or in-person support group for people who have chronic pain. Keep all follow-up visits. This is important. Where to find more information You can find more information about managing pain without opioids from: American Academy of Pain Medicine: painmed.org Institute for Chronic Pain: instituteforchronicpain.org American Chronic Pain Association: theacpa.org Contact a health care provider if: You have side effects from pain medicine. Your pain gets worse or does not get better with treatments or home therapy. You are struggling with anxiety or depression. Summary Many types of pain  can be managed without opioids. Chronic pain may respond better to pain management without opioids. Pain is best managed when you and a team of health care providers work together. Pain management without opioids may include non-opioid medicines, medical treatments, physical therapy, mental health therapy, and lifestyle changes. Tell your health care providers  if your pain gets worse or is not being managed well enough. This information is not intended to replace advice given to you by your health care provider. Make sure you discuss any questions you have with your health care provider. Document Revised: 02/21/2021 Document Reviewed: 02/21/2021 Elsevier Patient Education  2024 [employer].

## 2024-03-31 ENCOUNTER — Ambulatory Visit (INDEPENDENT_AMBULATORY_CARE_PROVIDER_SITE_OTHER): Payer: PPO

## 2024-03-31 DIAGNOSIS — I255 Ischemic cardiomyopathy: Secondary | ICD-10-CM | POA: Diagnosis not present

## 2024-03-31 LAB — CUP PACEART REMOTE DEVICE CHECK
Battery Remaining Longevity: 23 mo
Battery Remaining Percentage: 26 %
Battery Voltage: 2.81 V
Brady Statistic AP VP Percent: 1.6 %
Brady Statistic AP VS Percent: 68 %
Brady Statistic AS VP Percent: 1 %
Brady Statistic AS VS Percent: 29 %
Brady Statistic RA Percent Paced: 66 %
Brady Statistic RV Percent Paced: 1.9 %
Date Time Interrogation Session: 20250507020016
HighPow Impedance: 73 Ohm
HighPow Impedance: 73 Ohm
Implantable Lead Connection Status: 753985
Implantable Lead Connection Status: 753985
Implantable Lead Implant Date: 20131218
Implantable Lead Implant Date: 20180514
Implantable Lead Location: 753859
Implantable Lead Location: 753860
Implantable Lead Model: 5076
Implantable Pulse Generator Implant Date: 20180514
Lead Channel Impedance Value: 390 Ohm
Lead Channel Impedance Value: 510 Ohm
Lead Channel Pacing Threshold Amplitude: 0.5 V
Lead Channel Pacing Threshold Amplitude: 1.375 V
Lead Channel Pacing Threshold Pulse Width: 0.5 ms
Lead Channel Pacing Threshold Pulse Width: 0.5 ms
Lead Channel Sensing Intrinsic Amplitude: 3.7 mV
Lead Channel Sensing Intrinsic Amplitude: 5.7 mV
Lead Channel Setting Pacing Amplitude: 1.625
Lead Channel Setting Pacing Amplitude: 2 V
Lead Channel Setting Pacing Pulse Width: 0.5 ms
Lead Channel Setting Sensing Sensitivity: 0.5 mV
Pulse Gen Serial Number: 1245762
Zone Setting Status: 755011

## 2024-04-03 DIAGNOSIS — J471 Bronchiectasis with (acute) exacerbation: Secondary | ICD-10-CM | POA: Diagnosis not present

## 2024-04-16 ENCOUNTER — Other Ambulatory Visit: Payer: Self-pay | Admitting: Internal Medicine

## 2024-05-04 DIAGNOSIS — J471 Bronchiectasis with (acute) exacerbation: Secondary | ICD-10-CM | POA: Diagnosis not present

## 2024-05-10 NOTE — Progress Notes (Unsigned)
 Electrophysiology Clinic Note    Date:  05/11/2024  Patient ID:  Severino, Paolo 1932/05/17, MRN [id number] PCP:  Genia Kettering, MD  Cardiologist:  Janelle Mediate, MD Electrophysiologist: Richardo Chandler, MD   Discussed the use of AI scribe software for clinical note transcription with the patient, who gave verbal consent to proceed.   Patient Profile    Chief Complaint: AFib, PVC follow-up  History of Present Illness: ABDULLAHI VALLONE is a 89 y.o. male with PMH notable for ICM, CAD s/p CABG (2005), PVCs, HFrEF, persis AFib, bradycardia / chronotropic incompetence s/p PPM, RBBB, CKD - 3, memory problem ; seen today for Richardo Chandler, MD for routine electrophysiology followup.   He has had frequent falls throughout the past year with orthostatic hypotension dropping BP 30-46mmHg with position changes, necessitating GDMT adjustment.   I last saw him 01/2024 at which time his CrCl had reduced to necessitate reducing tikosyn  to 125mcg BID.  BP low with orthostatic hypotension, so stopped metoprolol . He then had several episodes of AFib w RVR, and so toprol  was restarted at 25mg  daily.  He last saw Dr. Rodolfo Clan 02/2024, considered shortening AV delay to improve exercise intolerance.   On follow-up today, he feels about the same -continues to sleep multiple hours during the day and very little at night.  He and his wife Ninette Basque continued to have disagreements regarding the patient's activity and sleep habits. He is not aware of any recent A-fib episodes, continues to take Tikosyn  and Eliquis  twice per day.  No recent falls.  He denies chest pain, chest pressure, increased edema, palpitations.    Device Information: [employer]. Jude dual chamber ICD, imp 10/2012; cardiac arrest secondary prevention MDT RA lead imp 03/2017  AAD History: Amiodarone , stopped 7/19 Tikosyn  - loaded 06/2019    ROS:  Please see the history of present illness. All other systems are reviewed and otherwise negative.     Physical Exam    VS:  BP 130/62 (BP Location: Left Arm, Patient Position: Sitting, Cuff Size: Normal)   Pulse 70   Ht 5' 7 (1.702 m)   Wt 130 lb (59 kg)   SpO2 96%   BMI 20.36 kg/m  BMI: Body mass index is 20.36 kg/m.  Wt Readings from Last 3 Encounters:  05/11/24 130 lb (59 kg)  03/30/24 128 lb (58.1 kg)  03/23/24 128 lb 6.4 oz (58.2 kg)     GEN- thin, alert and oriented x 3 today.  Lungs- Clear to ausculation bilaterally, normal work of breathing.  Heart- Regular rate and rhythm, no murmurs, rubs or gallops Extremities- No peripheral edema, warm, dry Skin-  device pocket well-healed, no tethering   Device interrogation done today and reviewed by myself:  Battery 1.8 years Lead thresholds, impedence, sensing stable  Low AFib burden Low VP No changes made today   Studies Reviewed   Previous EP, cardiology notes.    EKG is ordered. Personal review of EKG from today shows:    EKG Interpretation Date/Time:  Tuesday May 11 2024 10:00:01 EDT Ventricular Rate:  70 PR Interval:  358 QRS Duration:  170 QT Interval:  458 QTC Calculation: 494 R Axis:   250  Text Interpretation: Atrial-paced rhythm with prolonged AV conduction Right bundle branch block Confirmed by Ashe Graybeal [phone]) on 05/11/2024 10:05:26 AM   QTC corrected for RBBB -  03/04/2024 EKG - AP with 1st deg HB, RBBB; PR 350, QRS 180 QT 444 QTC corrected for RBBB -    NM Pet CT Cardiac, 01/22/2024   LV perfusion is abnormal. There is no evidence of ischemia. There is evidence of infarction. Defect 1: There is a medium defect with severe reduction in uptake present in the apical to basal lateral location(s) that is fixed. There is abnormal wall motion in the defect area. Consistent with infarction.   Rest left ventricular function is abnormal. Rest global function is mildly reduced. Rest EF: 49%. Stress left ventricular function is normal. Stress EF: 50%. End diastolic cavity size is  normal.   Myocardial blood flow was computed to be 0.24ml/g/min at rest and 1.77ml/g/min at stress. Global myocardial blood flow reserve was 2.27 and was normal.   Coronary calcium  assessment not performed due to prior revascularization.   Findings are consistent with infarction and no ischemia. The study is intermediate risk.  Chest CT, 01/21/2023 1. Extensive, heterogeneous nodular and consolidative airspace disease throughout the lung bases, with associated bronchiolar plugging. Findings are consistent with infection, perhaps atypical mycobacterial, or alternately aspiration. 2. Trace right pleural effusion. 3. Coronary artery disease. 4. Aortic valve calcifications. Correlate for echocardiographic evidence of aortic valve dysfunction.  TTE, 03/26/2023 (scanned into chart from [place])   TTE, 08/23/2021  1. Left ventricular ejection fraction, by estimation, is 40 to 45%. The left ventricle has mildly decreased function. The left ventricle demonstrates global hypokinesis. Left ventricular diastolic parameters are consistent with Grade I diastolic  dysfunction (impaired relaxation).   2. Right ventricular systolic function is normal. The right ventricular size is normal. There is normal pulmonary artery systolic pressure. The estimated right ventricular systolic pressure is 23.1 mmHg.   3. The mitral valve is normal in structure. Mild mitral valve regurgitation. No evidence of mitral stenosis.   4. The aortic valve was not well visualized. Aortic valve regurgitation is not visualized. Mild to moderate aortic valve sclerosis/calcification is present, without any evidence of aortic stenosis.   5. The inferior vena cava is normal in size with greater than 50% respiratory variability, suggesting right atrial pressure of 3 mmHg.   6. Frequent PVCs noted.   7. Challenging images   Comparison(s): LVEF 40-45%.    Assessment and Plan     #) persis AFib #) tikosyn  monitoring  EKG with stable intervals  on tikosyn  125 mcg BID Low AFib burden via device Continue 25mg  toprol  daily Update BMP, Mag today   #) Hypercoag d/t persis afib CHA2DS2-VASc Score = at least 5 [CHF History: 1, HTN History: 1, Diabetes History: 0, Stroke History: 0, Vascular Disease History: 1, Age Score: 2, Gender Score: 0].  Therefore, the patient's annual risk of stroke is 7.2 %.    Stroke ppx - 2.5mg  eliquis , appropriately dosed No bleeding concerns   #) sinus node dysfunction s/p dual chamber ICD #) ICM #) fatigue Device functioning well, see paceart for details Device programmed with long A-V delay to promote intrinsic ventricular conduction.  In the past patient had decreased LVEF with increased V pacing.  Patient now having significant daytime fatigue that is likely multifactorial, though A-V delay may be contributing Discussed adjusting this with patient, who prefers MD make this decision      Current medicines are reviewed at length with the patient today.   The patient does not have concerns regarding his medicines.  The following changes were made today:   none  Labs/ tests ordered today include:  Orders Placed This Encounter  Procedures   Basic metabolic panel with GFR   Magnesium   EKG 12-Lead     Disposition: Follow up with Dr. Daneil Dunker or EP APP in 3 months, prefer MD to establish with new provider   Signed, Adaline Holly, NP  05/11/24  11:08 AM  Electrophysiology [facility]

## 2024-05-11 ENCOUNTER — Ambulatory Visit: Attending: Cardiology | Admitting: Cardiology

## 2024-05-11 VITALS — BP 130/62 | HR 70 | Ht 67.0 in | Wt 130.0 lb

## 2024-05-11 DIAGNOSIS — Z95810 Presence of automatic (implantable) cardiac defibrillator: Secondary | ICD-10-CM | POA: Diagnosis not present

## 2024-05-11 DIAGNOSIS — I4819 Other persistent atrial fibrillation: Secondary | ICD-10-CM | POA: Diagnosis not present

## 2024-05-11 DIAGNOSIS — Z79899 Other long term (current) drug therapy: Secondary | ICD-10-CM | POA: Diagnosis not present

## 2024-05-11 DIAGNOSIS — I495 Sick sinus syndrome: Secondary | ICD-10-CM | POA: Diagnosis not present

## 2024-05-11 DIAGNOSIS — Z5181 Encounter for therapeutic drug level monitoring: Secondary | ICD-10-CM | POA: Diagnosis not present

## 2024-05-11 DIAGNOSIS — D6869 Other thrombophilia: Secondary | ICD-10-CM | POA: Diagnosis not present

## 2024-05-11 LAB — CUP PACEART INCLINIC DEVICE CHECK
Battery Remaining Longevity: 22 mo
Brady Statistic RA Percent Paced: 72 %
Brady Statistic RV Percent Paced: 1.8 %
Date Time Interrogation Session: 20250617122418
HighPow Impedance: 72 Ohm
Implantable Lead Connection Status: 753985
Implantable Lead Connection Status: 753985
Implantable Lead Implant Date: 20131218
Implantable Lead Implant Date: 20180514
Implantable Lead Location: 753859
Implantable Lead Location: 753860
Implantable Lead Model: 5076
Implantable Pulse Generator Implant Date: 20180514
Lead Channel Impedance Value: 387.5 Ohm
Lead Channel Impedance Value: 512.5 Ohm
Lead Channel Pacing Threshold Amplitude: 0.5 V
Lead Channel Pacing Threshold Amplitude: 0.5 V
Lead Channel Pacing Threshold Amplitude: 1.25 V
Lead Channel Pacing Threshold Amplitude: 1.25 V
Lead Channel Pacing Threshold Pulse Width: 0.5 ms
Lead Channel Pacing Threshold Pulse Width: 0.5 ms
Lead Channel Pacing Threshold Pulse Width: 0.5 ms
Lead Channel Pacing Threshold Pulse Width: 0.5 ms
Lead Channel Sensing Intrinsic Amplitude: 2.7 mV
Lead Channel Sensing Intrinsic Amplitude: 4.7 mV
Lead Channel Setting Pacing Amplitude: 1.5 V
Lead Channel Setting Pacing Amplitude: 2 V
Lead Channel Setting Pacing Pulse Width: 0.5 ms
Lead Channel Setting Sensing Sensitivity: 0.5 mV
Pulse Gen Serial Number: 1245762
Zone Setting Status: 755011

## 2024-05-11 NOTE — Progress Notes (Signed)
 Remote ICD transmission.

## 2024-05-11 NOTE — Patient Instructions (Signed)
 Medication Instructions:  The current medical regimen is effective;  continue present plan and medications as directed. Please refer to the Current Medication list given to you today.   *If you need a refill on your cardiac medications before your next appointment, please call your pharmacy*  Lab Work: Your provider would like for you to have following labs drawn today BMET, MAG.   If you have labs (blood work) drawn today and your tests are completely normal, you will receive your results only by: MyChart Message (if you have MyChart) OR A paper copy in the mail If you have any lab test that is abnormal or we need to change your treatment, we will call you to review the results.   Follow-Up: At [facility], you and your health needs are our priority.  As part of our continuing mission to provide you with exceptional heart care, our providers are all part of one team.  This team includes your primary Cardiologist (physician) and Advanced Practice Providers or APPs (Physician Assistants and Nurse Practitioners) who all work together to provide you with the care you need, when you need it.  Your next appointment:   3 month(s)  Provider:   Ardeen Kohler, MD    We recommend signing up for the patient portal called MyChart.  Sign up information is provided on this After Visit Summary.  MyChart is used to connect with patients for Virtual Visits (Telemedicine).  Patients are able to view lab/test results, encounter notes, upcoming appointments, etc.  Non-urgent messages can be sent to your provider as well.   To learn more about what you can do with MyChart, go to [email or web address].

## 2024-05-12 ENCOUNTER — Ambulatory Visit: Payer: Self-pay | Admitting: Cardiology

## 2024-05-12 LAB — BASIC METABOLIC PANEL WITH GFR
BUN/Creatinine Ratio: 17 (ref 10–24)
BUN: 19 mg/dL (ref 8–27)
CO2: 26 mmol/L (ref 20–29)
Calcium: 10.3 mg/dL — ABNORMAL HIGH (ref 8.6–10.2)
Chloride: 98 mmol/L (ref 96–106)
Creatinine, Ser: 1.09 mg/dL (ref 0.76–1.27)
Glucose: 95 mg/dL (ref 70–99)
Potassium: 4.4 mmol/L (ref 3.5–5.2)
Sodium: 136 mmol/L (ref 134–144)
eGFR: 65 mL/min/{1.73_m2} (ref >59)

## 2024-05-12 LAB — MAGNESIUM: Magnesium: 1.9 mg/dL (ref 1.6–2.3)

## 2024-05-15 ENCOUNTER — Other Ambulatory Visit: Payer: Self-pay | Admitting: Internal Medicine

## 2024-05-25 ENCOUNTER — Ambulatory Visit (INDEPENDENT_AMBULATORY_CARE_PROVIDER_SITE_OTHER): Admitting: Internal Medicine

## 2024-05-25 ENCOUNTER — Encounter: Payer: Self-pay | Admitting: Internal Medicine

## 2024-05-25 VITALS — BP 110/68 | HR 76 | Temp 97.8°F | Ht 67.0 in | Wt 125.0 lb

## 2024-05-25 DIAGNOSIS — I10 Essential (primary) hypertension: Secondary | ICD-10-CM

## 2024-05-25 DIAGNOSIS — R29898 Other symptoms and signs involving the musculoskeletal system: Secondary | ICD-10-CM | POA: Diagnosis not present

## 2024-05-25 DIAGNOSIS — R634 Abnormal weight loss: Secondary | ICD-10-CM

## 2024-05-25 DIAGNOSIS — E538 Deficiency of other specified B group vitamins: Secondary | ICD-10-CM | POA: Diagnosis not present

## 2024-05-25 DIAGNOSIS — R531 Weakness: Secondary | ICD-10-CM

## 2024-05-25 DIAGNOSIS — W19XXXS Unspecified fall, sequela: Secondary | ICD-10-CM

## 2024-05-25 MED ORDER — DOXYCYCLINE HYCLATE 100 MG PO TABS: 100.0000 mg | ORAL_TABLET | Freq: Two times a day (BID) | ORAL | 1 refills | Status: AC

## 2024-05-25 NOTE — Assessment & Plan Note (Signed)
 Start PT

## 2024-05-25 NOTE — Assessment & Plan Note (Signed)
 Wt Readings from Last 3 Encounters:  05/25/24 125 lb (56.7 kg)  05/11/24 130 lb (59 kg)  03/30/24 128 lb (58.1 kg)

## 2024-05-25 NOTE — Assessment & Plan Note (Signed)
 BP Readings from Last 3 Encounters:  05/25/24 110/68  05/11/24 130/62  03/23/24 108/72

## 2024-05-25 NOTE — Assessment & Plan Note (Signed)
On B complex 

## 2024-05-25 NOTE — Progress Notes (Signed)
 Subjective:  Patient ID: [name] Christensen, male    DOB: 1932/04/18  Age: 89 y.o. MRN: [id number]  CC: Medical Management of Chronic Issues (2 mnth f/u, Pt wants to discuss dosage for his Metoprolol .. Pts wife have concerns of pt getting weaker, pt memory loss and has gotten worse a/w pt just wants to sit most of the day. Please inform pt is to use cane when walking outside and walker when indoors as he had a fall x10 days ago.)   HPI [employer] presents for falls x3, balance issues, fatigue F/u on chronic cough  Outpatient Medications Prior to Visit  Medication Sig Dispense Refill   acetaminophen  (TYLENOL ) 500 MG tablet Take 500 mg by mouth at bedtime.     alfuzosin  (UROXATRAL ) 10 MG 24 hr tablet Take 10 mg by mouth every evening.      apixaban  (ELIQUIS ) 2.5 MG TABS tablet Take 1 tablet (2.5 mg total) by mouth 2 (two) times daily. 180 tablet 3   ascorbic acid (VITAMIN C ) 250 MG tablet Take 250 mg by mouth daily.     atorvastatin  (LIPITOR) 40 MG tablet Take 20 mg by mouth daily.     b complex vitamins capsule Take 1 capsule by mouth daily. 100 capsule 3   betamethasone  dipropionate 0.05 % cream Apply 1 application. topically 2 (two) times daily as needed.     cinacalcet  (SENSIPAR ) 30 MG tablet Take 1 tablet (30 mg total) by mouth 3 (three) times a week. 25 tablet 0   cyanocobalamin  (VITAMIN B12) 1000 MCG tablet Take 1 tablet (1,000 mcg total) by mouth every other day as needed. 100 tablet 3   Dextromethorphan-guaiFENesin  (MUCINEX  DM PO) Take 30 mg by mouth in the morning and at bedtime.     dofetilide  (TIKOSYN ) 125 MCG capsule Take 1 capsule (125 mcg total) by mouth 2 (two) times daily. 180 capsule 2   donepezil  (ARICEPT ) 5 MG tablet TAKE ONE TABLET BY MOUTH EVERY NIGHT AT BEDTIME 90 tablet 3   dutasteride  (AVODART ) 0.5 MG capsule Take 0.5 mg by mouth every evening.      escitalopram  (LEXAPRO ) 10 MG tablet Take 1 tablet (10 mg total) by mouth at bedtime. 90 tablet 1   famotidine   (PEPCID ) 20 MG tablet Take 20 mg by mouth daily.     guaiFENesin  (MUCINEX ) 600 MG 12 hr tablet Take 600 mg by mouth as needed for cough.     HYDROcodone  bit-homatropine (HYCODAN) 5-1.5 MG/5ML syrup TAKE FIVE MLS (1 TEASPOONFUL) BY MOUTH EVERY SIX HOURS AS NEEDED FOR COUGH. 240 mL 0   ipratropium (ATROVENT) 0.03 % nasal spray Place 2 sprays into both nostrils 3 (three) times daily.     ipratropium-albuterol  (DUONEB) 0.5-2.5 (3) MG/3ML SOLN Take 3 mLs by nebulization every 6 (six) hours as needed. 360 mL 3   iron  polysaccharides (NIFEREX) 150 MG capsule Take 1 capsule (150 mg total) by mouth daily. 30 capsule 2   metoprolol  succinate (TOPROL -XL) 25 MG 24 hr tablet TAKE 1 TABLET BY MOUTH ONCE A DAY 30 tablet 11   Multiple Vitamin (MULTIVITAMIN WITH MINERALS) TABS tablet Take 1 tablet by mouth every other day.     nitroGLYCERIN  (NITROSTAT ) 0.4 MG SL tablet DISSOLVE 1 TABLET UNDER TONGUE AS NEEDEDFOR CHEST PAIN. MAY REPEAT 5 MINUTES APART 3 TIMES IF NEEDED 25 tablet 3   omeprazole (PRILOSEC) 20 MG capsule Take 20 mg by mouth daily.     Plecanatide  3 MG TABS Take 3 mg by mouth daily.  Replaces (Linaclotide ) Linzess      Polyethyl Glycol-Propyl Glycol (SYSTANE OP) Place 1 drop into both eyes daily as needed (burning eyes).      pseudoephedrine -guaifenesin  (MUCINEX  D) 60-600 MG 12 hr tablet Take 1 tablet by mouth at bedtime. 30 tablet 3   triamcinolone  cream (KENALOG ) 0.1 % APPLY 1 APPLICATION TOPICALLY TWO TIMES DAILY AS NEEDED AVOIDING FACE, GROIN ANDARMPITS 80 g 0   doxycycline  (VIBRA -TABS) 100 MG tablet TAKE ONE TABLET (100 MG TOTAL) BY MOUTH TWO TIMES DAILY. 60 tablet 0   No facility-administered medications prior to visit.    ROS: Review of Systems  Constitutional:  Positive for fatigue. Negative for appetite change and unexpected weight change.  HENT:  Negative for congestion, nosebleeds, sneezing, sore throat and trouble swallowing.   Eyes:  Negative for itching and visual disturbance.   Respiratory:  Positive for cough.   Cardiovascular:  Negative for chest pain, palpitations and leg swelling.  Gastrointestinal:  Negative for abdominal distention, blood in stool, diarrhea and nausea.  Genitourinary:  Negative for frequency and hematuria.  Musculoskeletal:  Positive for gait problem. Negative for arthralgias, back pain, joint swelling and neck pain.  Skin:  Negative for color change and rash.  Neurological:  Negative for dizziness, tremors, speech difficulty and weakness.  Psychiatric/Behavioral:  Negative for agitation, dysphoric mood and sleep disturbance. The patient is not nervous/anxious.     Objective:  BP 110/68   Pulse 76   Temp 97.8 F (36.6 C) (Oral)   Ht 5' 7 (1.702 m)   Wt 125 lb (56.7 kg)   SpO2 95%   BMI 19.58 kg/m   BP Readings from Last 3 Encounters:  05/25/24 110/68  05/11/24 130/62  03/23/24 108/72    Wt Readings from Last 3 Encounters:  05/25/24 125 lb (56.7 kg)  05/11/24 130 lb (59 kg)  03/30/24 128 lb (58.1 kg)    Physical Exam Constitutional:      General: He is not in acute distress.    Appearance: Normal appearance. He is well-developed.     Comments: NAD   Eyes:     Conjunctiva/sclera: Conjunctivae normal.     Pupils: Pupils are equal, round, and reactive to light.   Neck:     Thyroid : No thyromegaly.     Vascular: No JVD.   Cardiovascular:     Rate and Rhythm: Normal rate and regular rhythm.     Heart sounds: Normal heart sounds. No murmur heard.    No friction rub. No gallop.  Pulmonary:     Effort: Pulmonary effort is normal. No respiratory distress.     Breath sounds: Normal breath sounds. No wheezing or rales.  Chest:     Chest wall: No tenderness.  Abdominal:     General: Bowel sounds are normal. There is no distension.     Palpations: Abdomen is soft. There is no mass.     Tenderness: There is no abdominal tenderness. There is no guarding or rebound.   Musculoskeletal:        General: No tenderness.  Normal range of motion.     Cervical back: Normal range of motion.     Right lower leg: No edema.     Left lower leg: No edema.  Lymphadenopathy:     Cervical: No cervical adenopathy.   Skin:    General: Skin is warm and dry.     Findings: No rash.   Neurological:     Mental Status: He is alert and oriented to  person, place, and time. Mental status is at baseline.     Cranial Nerves: No cranial nerve deficit.     Motor: Weakness present. No abnormal muscle tone.     Coordination: Coordination abnormal.     Gait: Gait abnormal.     Deep Tendon Reflexes: Reflexes are normal and symmetric.   Psychiatric:        Behavior: Behavior normal.        Thought Content: Thought content normal.        Judgment: Judgment normal.    Old bruises on face Using a walker   Lab Results  Component Value Date   WBC 5.1 02/20/2024   HGB 10.8 (L) 02/20/2024   HCT 33.2 (L) 02/20/2024   PLT 158 02/20/2024   GLUCOSE 95 05/11/2024   CHOL 93 09/30/2022   TRIG 113.0 09/30/2022   HDL 44.80 09/30/2022   LDLCALC 26 09/30/2022   ALT 13 12/25/2023   AST 25 12/25/2023   NA 136 05/11/2024   K 4.4 05/11/2024   CL 98 05/11/2024   CREATININE 1.09 05/11/2024   BUN 19 05/11/2024   CO2 26 05/11/2024   TSH 2.231 02/20/2024   PSA 12.45 (H) 02/06/2016   INR 1.1 04/02/2017   HGBA1C 6.1 12/30/2019    CT Chest Wo Contrast Result Date: 02/20/2024 CLINICAL DATA:  Chronic dyspnea.  Cardiac PET CT 01/22/2024. EXAM: CT CHEST WITHOUT CONTRAST TECHNIQUE: Multidetector CT imaging of the chest was performed following the standard protocol without IV contrast. RADIATION DOSE REDUCTION: This exam was performed according to the departmental dose-optimization program which includes automated exposure control, adjustment of the mA and/or kV according to patient size and/or use of iterative reconstruction technique. COMPARISON:  CT angiogram chest 11/20/2022. FINDINGS: Cardiovascular: Heart is mildly enlarged. There is no  pericardial effusion. Aorta is normal in size. Left-sided pacemaker is present. Mediastinum/Nodes: There are calcified subcarinal and right hilar lymph nodes. No enlarged lymph nodes are seen. Visualized esophagus and thyroid  gland are within normal limits. Lungs/Pleura: Examination is limited secondary to respiratory motion artifact. There are minimal tree-in-bud opacities in the left upper lobe. Peripheral reticular opacities are seen in the bilateral lower lungs/right greater than left. There is mild patchy airspace opacity in the right middle lobe. No pleural effusion or pneumothorax. No ground-glass opacities. There is a ground-glass nodule in the left lower lobe image 5/82 measuring 7 mm which is unchanged from 2023 study. Upper Abdomen: No acute abnormality. Musculoskeletal: Sternotomy wires are present.  No acute fracture. IMPRESSION: 1. Mild patchy airspace opacity in the right middle lobe may represent atelectasis or infection. 2. Minimal tree-in-bud opacities in the left upper lobe may be infectious/inflammatory. 3. Peripheral reticular opacities in the bilateral lower lungs/right greater than left, likely fibrotic changes. 4. Stable 7 mm ground-glass nodule in the left lower lobe unchanged from 2023. Continued follow-up up by CT is recommended in 12 months, with continued annual surveillance for a minimum of 3 years to exclude adenocarcinoma. recommendations are taken from: Recommendations for the Management of Subsolid Pulmonary Nodules Detected at CT: A Statement from the Fleischner Society Radiology 2013; 266:1, 304-317. 5. Mild cardiomegaly. Electronically Signed   By: Greig Pique M.D.   On: 02/20/2024 18:05    Assessment & Plan:   Problem List Items Addressed This Visit     Essential hypertension   BP Readings from Last 3 Encounters:  05/25/24 110/68  05/11/24 130/62  03/23/24 108/72         Weakness - Primary  Start PT      Relevant Orders   Ambulatory referral to Physical  Therapy   Weight loss   Wt Readings from Last 3 Encounters:  05/25/24 125 lb (56.7 kg)  05/11/24 130 lb (59 kg)  03/30/24 128 lb (58.1 kg)         Fall   Relevant Orders   Ambulatory referral to Physical Therapy   Vitamin B12 deficiency (non anemic)   On B complex      Weakness of both legs   Using a walker Start PT         Meds ordered this encounter  Medications   doxycycline  (VIBRA -TABS) 100 MG tablet    Sig: Take 1 tablet (100 mg total) by mouth 2 (two) times daily.    Dispense:  60 tablet    Refill:  1      Follow-up: Return in about 2 months (around 07/26/2024) for a follow-up visit.  Marolyn Noel, MD

## 2024-05-25 NOTE — Assessment & Plan Note (Signed)
 Using a walker Start PT

## 2024-06-14 ENCOUNTER — Telehealth: Payer: Self-pay

## 2024-06-14 DIAGNOSIS — M545 Low back pain, unspecified: Secondary | ICD-10-CM

## 2024-06-14 DIAGNOSIS — I509 Heart failure, unspecified: Secondary | ICD-10-CM

## 2024-06-14 DIAGNOSIS — R29898 Other symptoms and signs involving the musculoskeletal system: Secondary | ICD-10-CM

## 2024-06-14 NOTE — Telephone Encounter (Unsigned)
 Copied from CRM [id number]. Topic: Clinical - Order For Equipment >> Jun 14, 2024  2:55 PM Mercedes MATSU wrote: Reason for CRM: Patient called in requesting a light walker with 4 wheels, a seat, and he does not want a high unit. He wants something that he can sit down on. Patient is requesting Dr. Garald place this order and he is prone to falling. Patient is requesting a call back to [phone]. Patient said his insurance company is sending him some options.

## 2024-06-15 DIAGNOSIS — J479 Bronchiectasis, uncomplicated: Secondary | ICD-10-CM | POA: Diagnosis not present

## 2024-06-15 DIAGNOSIS — R2689 Other abnormalities of gait and mobility: Secondary | ICD-10-CM | POA: Diagnosis not present

## 2024-06-15 DIAGNOSIS — I5043 Acute on chronic combined systolic (congestive) and diastolic (congestive) heart failure: Secondary | ICD-10-CM | POA: Diagnosis not present

## 2024-06-15 DIAGNOSIS — R29898 Other symptoms and signs involving the musculoskeletal system: Secondary | ICD-10-CM | POA: Diagnosis not present

## 2024-06-15 NOTE — Addendum Note (Signed)
 Addended by: HEDDY IP R on: 06/15/2024 12:23 PM   Modules accepted: Orders

## 2024-06-15 NOTE — Telephone Encounter (Addendum)
 Spoke with the pt and was able to inform him we will send the order in for him.  Return of rx for walker so I can fax it over to [employer] [facility].

## 2024-06-21 ENCOUNTER — Telehealth: Payer: Self-pay | Admitting: Internal Medicine

## 2024-06-21 NOTE — Telephone Encounter (Unsigned)
 Copied from CRM [id number]. Topic: Clinical - Medical Advice >> Jun 21, 2024  8:18 AM Mia F wrote: Reason for CRM: Pt states he has been dealing with mucus build up for some years and he was wondering if he should get the RSV vaccine. Please advise

## 2024-06-22 NOTE — Telephone Encounter (Signed)
 I was able to print out the information asked by [employer] and add information as requested tot he order to continue with the order process per [employer].

## 2024-06-22 NOTE — Telephone Encounter (Signed)
 I was able to [facility] for the pt informing him of the providers advice He can get RSV vaccine at the pharmacy.  Thank you

## 2024-06-22 NOTE — Telephone Encounter (Signed)
 He can get RSV vaccine at the pharmacy.  Thank you

## 2024-06-28 ENCOUNTER — Other Ambulatory Visit: Payer: Self-pay | Admitting: Internal Medicine

## 2024-06-30 ENCOUNTER — Ambulatory Visit (INDEPENDENT_AMBULATORY_CARE_PROVIDER_SITE_OTHER): Payer: PPO

## 2024-06-30 DIAGNOSIS — I255 Ischemic cardiomyopathy: Secondary | ICD-10-CM | POA: Diagnosis not present

## 2024-06-30 LAB — CUP PACEART REMOTE DEVICE CHECK
Battery Remaining Longevity: 22 mo
Battery Remaining Percentage: 24 %
Battery Voltage: 2.8 V
Brady Statistic AP VP Percent: 1.7 %
Brady Statistic AP VS Percent: 77 %
Brady Statistic AS VP Percent: 1 %
Brady Statistic AS VS Percent: 20 %
Brady Statistic RA Percent Paced: 76 %
Brady Statistic RV Percent Paced: 1.9 %
Date Time Interrogation Session: 20250806020017
HighPow Impedance: 69 Ohm
HighPow Impedance: 69 Ohm
Implantable Lead Connection Status: 753985
Implantable Lead Connection Status: 753985
Implantable Lead Implant Date: 20131218
Implantable Lead Implant Date: 20180514
Implantable Lead Location: 753859
Implantable Lead Location: 753860
Implantable Lead Model: 5076
Implantable Pulse Generator Implant Date: 20180514
Lead Channel Impedance Value: 380 Ohm
Lead Channel Impedance Value: 530 Ohm
Lead Channel Pacing Threshold Amplitude: 0.5 V
Lead Channel Pacing Threshold Amplitude: 1.25 V
Lead Channel Pacing Threshold Pulse Width: 0.5 ms
Lead Channel Pacing Threshold Pulse Width: 0.5 ms
Lead Channel Sensing Intrinsic Amplitude: 3.6 mV
Lead Channel Sensing Intrinsic Amplitude: 6.3 mV
Lead Channel Setting Pacing Amplitude: 1.5 V
Lead Channel Setting Pacing Amplitude: 2 V
Lead Channel Setting Pacing Pulse Width: 0.5 ms
Lead Channel Setting Sensing Sensitivity: 0.5 mV
Pulse Gen Serial Number: 1245762
Zone Setting Status: 755011

## 2024-07-04 ENCOUNTER — Ambulatory Visit: Payer: Self-pay | Admitting: Cardiology

## 2024-07-27 ENCOUNTER — Telehealth: Payer: Self-pay | Admitting: Cardiology

## 2024-07-27 NOTE — Telephone Encounter (Signed)
 Patient is calling in about the labs and EKG done, but the order was cancel. Please advise

## 2024-07-27 NOTE — Telephone Encounter (Signed)
 Called patient. Made appointment with Dr Cindie for tomorrow. Pt was due to have 3 month follow up from 6/17 and was never recalled to make the appointment.

## 2024-07-27 NOTE — Progress Notes (Unsigned)
  Electrophysiology Office Follow up Visit Note:    Date:  07/28/2024   ID:  [name] Christensen, DOB 25-May-1932, MRN [id number]  PCP:  [name] Karlynn GAILS, MD  [facility] Cardiologist:  [name] Emmer, MD  [facility] Electrophysiologist:  [name] Sage, MD    Interval History:     [name] Christensen is a 89 y.o. male who presents for a follow up visit.   The patient last saw [name] Christensen 17, 2025.  Has a history of coronary artery disease with prior CABG, PVCs, chronic systolic heart failure and a dual-chamber ICD in situ for symptomatic bradycardia and secondary prevention given history of cardiac arrest.  The patient takes Tikosyn  125 mcg by mouth twice daily.  On Eliquis  for stroke prophylaxis.  He is doing well today.  No complaints.      Past medical, surgical, social and family history were reviewed.  ROS:   Please see the history of present illness.    All other systems reviewed and are negative.  EKGs/Labs/Other Studies Reviewed:    The following studies were reviewed today:  July 28, 2024 in-clinic device interrogation personally reviewed Lead parameter stable     EKG Interpretation Date/Time:  Wednesday July 28 2024 11:08:54 EDT Ventricular Rate:  70 PR Interval:  338 QRS Duration:  168 QT Interval:  420 QTC Calculation: 454 R Axis:   252  Text Interpretation: Atrial-paced rhythm with prolonged AV conduction Right bundle branch block Confirmed by [name] Christensen [id number]) on 07/28/2024 11:12:11 AM    Physical Exam:    VS:  BP 120/60 (BP Location: Left Arm, Patient Position: Sitting, Cuff Size: Normal)   Pulse 70   Ht 5' 6 (1.676 m)   Wt 126 lb (57.2 kg)   SpO2 97%   BMI 20.34 kg/m     Wt Readings from Last 3 Encounters:  07/28/24 126 lb (57.2 kg)  05/25/24 125 lb (56.7 kg)  05/11/24 130 lb (59 kg)     GEN: no distress CARD: RRR, No MRG.  Pacemaker pocket well-healed RESP: No IWOB. CTAB.      ASSESSMENT:    1. Persistent atrial  fibrillation (HCC)   2. Sinus node dysfunction (HCC)   3. Chronic systolic heart failure (HCC)   4. Encounter for long-term (current) use of high-risk medication    PLAN:    In order of problems listed above:  #Persistent atrial fibrillation #High risk med monitoring-Tikosyn  The patient is maintaining normal rhythm on Tikosyn .  Continue current dose.  QTc acceptable for ongoing use. Update BMP and magnesium  today.  #Symptomatic bradycardia #Dual-chamber ICD in situ We discussed generator replacement in detail during today's clinic appointment.  When his device reaches ERI, we can schedule him for his generator replacement.  He will need to hold his Eliquis  for 3 days prior to the generator replacement procedure.  Risks, benefits, and alternatives to pulse generator replacement were discussed in detail today.  The patient understands that risks include but are not limited to bleeding, infection, pneumothorax, perforation, tamponade, vascular damage, renal failure, MI, stroke, death, inappropriate shocks, damage to his existing leads, and lead dislodgement and wishes to proceed when indicated.     Follow-up 3 months with me.  Signed, Christensen Cindie, MD, [facility], [facility] 07/28/2024 11:12 AM    Electrophysiology [facility]

## 2024-07-28 ENCOUNTER — Other Ambulatory Visit: Payer: Self-pay

## 2024-07-28 ENCOUNTER — Ambulatory Visit: Attending: Cardiology | Admitting: Cardiology

## 2024-07-28 ENCOUNTER — Encounter: Payer: Self-pay | Admitting: Cardiology

## 2024-07-28 VITALS — BP 120/60 | HR 70 | Ht 66.0 in | Wt 126.0 lb

## 2024-07-28 DIAGNOSIS — I5022 Chronic systolic (congestive) heart failure: Secondary | ICD-10-CM | POA: Diagnosis not present

## 2024-07-28 DIAGNOSIS — I495 Sick sinus syndrome: Secondary | ICD-10-CM

## 2024-07-28 DIAGNOSIS — I4819 Other persistent atrial fibrillation: Secondary | ICD-10-CM | POA: Diagnosis not present

## 2024-07-28 DIAGNOSIS — Z79899 Other long term (current) drug therapy: Secondary | ICD-10-CM

## 2024-07-28 LAB — CUP PACEART INCLINIC DEVICE CHECK
Date Time Interrogation Session: 20250903153637
Implantable Lead Connection Status: 753985
Implantable Lead Connection Status: 753985
Implantable Lead Implant Date: 20131218
Implantable Lead Implant Date: 20180514
Implantable Lead Location: 753859
Implantable Lead Location: 753860
Implantable Lead Model: 5076
Implantable Pulse Generator Implant Date: 20180514
Pulse Gen Serial Number: 1245762

## 2024-07-28 NOTE — Patient Instructions (Signed)
 Medication Instructions:  Your physician recommends that you continue on your current medications as directed. Please refer to the Current Medication list given to you today.  *If you need a refill on your cardiac medications before your next appointment, please call your pharmacy*  Lab Work: TODAY: BMET and Mag  Follow-Up: At [facility], you and your health needs are our priority.  As part of our continuing mission to provide you with exceptional heart care, our providers are all part of one team.  This team includes your primary Cardiologist (physician) and Advanced Practice Providers or APPs (Physician Assistants and Nurse Practitioners) who all work together to provide you with the care you need, when you need it.  Your next appointment:   3 months  Provider:   Ole Holts, MD

## 2024-07-29 ENCOUNTER — Telehealth: Payer: Self-pay | Admitting: Cardiology

## 2024-07-29 ENCOUNTER — Telehealth: Payer: Self-pay

## 2024-07-29 ENCOUNTER — Ambulatory Visit: Payer: Self-pay | Admitting: *Deleted

## 2024-07-29 LAB — BASIC METABOLIC PANEL WITH GFR
BUN/Creatinine Ratio: 16 (ref 10–24)
BUN: 20 mg/dL (ref 8–27)
CO2: 26 mmol/L (ref 20–29)
Calcium: 10.8 mg/dL — ABNORMAL HIGH (ref 8.6–10.2)
Chloride: 99 mmol/L (ref 96–106)
Creatinine, Ser: 1.24 mg/dL (ref 0.76–1.27)
Glucose: 108 mg/dL — ABNORMAL HIGH (ref 70–99)
Potassium: 4.4 mmol/L (ref 3.5–5.2)
Sodium: 137 mmol/L (ref 134–144)
eGFR: 56 mL/min/1.73 — ABNORMAL LOW (ref >59)

## 2024-07-29 LAB — MAGNESIUM: Magnesium: 2 mg/dL (ref 1.6–2.3)

## 2024-07-29 NOTE — Telephone Encounter (Signed)
 Called pt and advised ov is needed for documentation purposes, scheduled for 9/9 at 3 pm

## 2024-07-29 NOTE — Telephone Encounter (Signed)
 Spoke with pt, aware there is no menton of xray in the office note from yesterday and there is no order. He reports he has swelling in his right chest and it is painful to touch. He reports he has a lot of congestion and wants a xray to see what is going on. Referred patient to his medical doctor.

## 2024-07-29 NOTE — Telephone Encounter (Signed)
 FYI Only or Action Required?: Action required by provider: requesting x ray today .  Patient was last seen in primary care on 05/25/2024 by Plotnikov, Karlynn GAILS, MD.  Called Nurse Triage reporting Mass.  Symptoms began several weeks ago.  Interventions attempted: Other: unsure .  Symptoms are: gradually worsening.  Triage Disposition: See Physician Within 24 Hours  Patient/caregiver understands and will follow disposition?: No, wishes to speak with PCP   Please advise                 Copied from CRM #[id number]. Topic: Clinical - Red Word Triage >> Jul 29, 2024  2:13 PM Mia F wrote: Red Word that prompted transfer to Nurse Triage: A painful knot on the right side of his chest. Has been there for about 2-3 weeks. Not sure if it is getting worse. Described it as hard and painful to touch. Reason for Disposition  [1] Swelling is painful to touch AND [2] no fever  Answer Assessment - Initial Assessment Questions Requesting x ray today. No available appt with PCP and patient did not want to schedule appt until after getting xray. Patient very upset at protocol for getting evaluation and then PCP or provider determining if xray needed. Please advise ASAP if possible. Patient requesting call back on mobile #        1. APPEARANCE of SWELLING: What does it look like?     Lump located right chest area reports around nipple area hard and painful to touch.  2. SIZE: How large is the swelling? (e.g., inches, cm; or compare to size of pinhead, tip of pen, eraser, coin, pea, grape, ping pong ball)      Did not report 3. LOCATION: Where is the swelling located?     Right chest area 4. ONSET: When did the swelling start?     2-3 weeks ago  5. COLOR: What color is it? Is there more than one color?     Same color as skin  6. PAIN: Is there any pain? If Yes, ask: How bad is the pain? (Scale 1-10; or mild, moderate, severe)       Painful to touch 7. ITCH: Does it  itch? If Yes, ask: How bad is the itch?      Na  8. CAUSE: What do you think caused the swelling?     Not sure  9 OTHER SYMPTOMS: Do you have any other symptoms? (e.g., fever)     Coughing up more mucus than normal. Hard knot around nipple area  Protocols used: Skin Lump or Localized Swelling-A-AH

## 2024-07-29 NOTE — Telephone Encounter (Signed)
 Pt called in stating he was supposed to have an xray yesterday but he forgot. I didn't see an order for anything so I'm not sure what he is referring to, please advise.

## 2024-07-29 NOTE — Telephone Encounter (Signed)
 Copied from CRM #[id number]. Topic: Appointments - Scheduling Inquiry for Clinic >> Jul 29, 2024 10:22 AM [place] A wrote: Reason for CRM: Patient wants appt to discuss knot on high right side of chest and X-ray or test that would identify what it is. Agent was going to schedule pt for 08/03/24 but patient said he wants sooner than that and to ask Dr. Garald to see if he can work him in please.

## 2024-07-30 NOTE — Telephone Encounter (Signed)
 Appt scheduled 09/09

## 2024-07-31 ENCOUNTER — Other Ambulatory Visit: Payer: Self-pay | Admitting: Internal Medicine

## 2024-07-31 ENCOUNTER — Ambulatory Visit: Payer: Self-pay | Admitting: Cardiology

## 2024-08-03 ENCOUNTER — Encounter: Payer: Self-pay | Admitting: Internal Medicine

## 2024-08-03 ENCOUNTER — Ambulatory Visit: Admitting: Internal Medicine

## 2024-08-03 VITALS — BP 138/66 | HR 70 | Temp 98.3°F | Ht 66.0 in | Wt 125.2 lb

## 2024-08-03 DIAGNOSIS — R918 Other nonspecific abnormal finding of lung field: Secondary | ICD-10-CM | POA: Diagnosis not present

## 2024-08-03 DIAGNOSIS — R413 Other amnesia: Secondary | ICD-10-CM | POA: Diagnosis not present

## 2024-08-03 DIAGNOSIS — I4819 Other persistent atrial fibrillation: Secondary | ICD-10-CM | POA: Diagnosis not present

## 2024-08-03 DIAGNOSIS — Z23 Encounter for immunization: Secondary | ICD-10-CM | POA: Diagnosis not present

## 2024-08-03 DIAGNOSIS — I4892 Unspecified atrial flutter: Secondary | ICD-10-CM | POA: Diagnosis not present

## 2024-08-03 DIAGNOSIS — R059 Cough, unspecified: Secondary | ICD-10-CM

## 2024-08-03 DIAGNOSIS — F419 Anxiety disorder, unspecified: Secondary | ICD-10-CM | POA: Diagnosis not present

## 2024-08-03 DIAGNOSIS — N62 Hypertrophy of breast: Secondary | ICD-10-CM

## 2024-08-03 MED ORDER — CETIRIZINE HCL 10 MG PO TABS: 10.0000 mg | ORAL_TABLET | Freq: Every day | ORAL | 3 refills | Status: AC

## 2024-08-03 NOTE — Assessment & Plan Note (Addendum)
 Post-COVID cough High res CT chest w/bronchiectasis and pulm fibrosis

## 2024-08-03 NOTE — Assessment & Plan Note (Signed)
 On Eliquis 

## 2024-08-03 NOTE — Assessment & Plan Note (Signed)
 Lexapro  at hs

## 2024-08-03 NOTE — Assessment & Plan Note (Signed)
 2023 CT chest w/bronchiectasis and pulm fibrosis

## 2024-08-03 NOTE — Assessment & Plan Note (Signed)
 R>L We are getting a high res CT chest Mammo if not better

## 2024-08-03 NOTE — Assessment & Plan Note (Signed)
Eliquis Toprol

## 2024-08-03 NOTE — Assessment & Plan Note (Signed)
 Stable

## 2024-08-03 NOTE — Progress Notes (Signed)
 Subjective:  Patient ID: [name] Christensen, male    DOB: 07/20/1932  Age: 89 y.o. MRN: [id number]  CC: Breast Problem (Right painful lump on right nipple yesterday it started going to to the left side. Patient states back pain as well and is unsure if that also has something to do with it. Noticed the lump for 2-3 weeks and it just kept getting more sensitive and larger. Lump became painful within the last 10 days. )   HPI [name] Christensen presents for Breast Problem (Right painful lump on right nipple yesterday it started going to to the left side. Patient states back pain as well and is unsure if that also has something to do with it. Noticed the lump for 2-3 weeks and it just kept getting more sensitive and larger. Lump became painful within the last 10 days.  C/o chronic cough Gym -  45 min 6 d/week Pt saw Dr Darlean and another pulmonologist  Outpatient Medications Prior to Visit  Medication Sig Dispense Refill   acetaminophen  (TYLENOL ) 500 MG tablet Take 500 mg by mouth at bedtime.     alfuzosin  (UROXATRAL ) 10 MG 24 hr tablet Take 10 mg by mouth every evening.      apixaban  (ELIQUIS ) 2.5 MG TABS tablet Take 1 tablet (2.5 mg total) by mouth 2 (two) times daily. 180 tablet 3   ascorbic acid (VITAMIN C ) 250 MG tablet Take 250 mg by mouth daily.     atorvastatin  (LIPITOR) 40 MG tablet Take 20 mg by mouth daily.     b complex vitamins capsule Take 1 capsule by mouth daily. 100 capsule 3   B Complex-C-Calcium  (GNP B-COMPLEX PLUS VITAMIN C ) TABS TAKE ONE TABLET BY MOUTH DAILY. 100 tablet 3   betamethasone  dipropionate 0.05 % cream Apply 1 application. topically 2 (two) times daily as needed.     cinacalcet  (SENSIPAR ) 30 MG tablet Take 1 tablet (30 mg total) by mouth 3 (three) times a week. 25 tablet 0   cyanocobalamin  (VITAMIN B12) 1000 MCG tablet Take 1 tablet (1,000 mcg total) by mouth every other day as needed. 100 tablet 3   Dextromethorphan-guaiFENesin  (MUCINEX  DM PO) Take 30 mg by mouth in  the morning and at bedtime.     dofetilide  (TIKOSYN ) 125 MCG capsule Take 1 capsule (125 mcg total) by mouth 2 (two) times daily. 180 capsule 2   donepezil  (ARICEPT ) 5 MG tablet TAKE ONE TABLET BY MOUTH EVERY NIGHT AT BEDTIME 90 tablet 3   doxycycline  (VIBRA -TABS) 100 MG tablet Take 1 tablet (100 mg total) by mouth 2 (two) times daily. 60 tablet 1   dutasteride  (AVODART ) 0.5 MG capsule Take 0.5 mg by mouth every evening.      escitalopram  (LEXAPRO ) 10 MG tablet Take 1 tablet (10 mg total) by mouth at bedtime. 90 tablet 1   famotidine  (PEPCID ) 20 MG tablet Take 20 mg by mouth daily.     guaiFENesin  (MUCINEX ) 600 MG 12 hr tablet Take 600 mg by mouth as needed for cough.     HYDROcodone  bit-homatropine (HYCODAN) 5-1.5 MG/5ML syrup TAKE FIVE MLS (1 TEASPOONFUL) BY MOUTH EVERY SIX HOURS AS NEEDED FOR COUGH. 240 mL 0   ipratropium (ATROVENT) 0.03 % nasal spray Place 2 sprays into both nostrils 3 (three) times daily.     ipratropium-albuterol  (DUONEB) 0.5-2.5 (3) MG/3ML SOLN Take 3 mLs by nebulization every 6 (six) hours as needed. 360 mL 3   iron  polysaccharides (NIFEREX) 150 MG capsule Take 1 capsule (150  mg total) by mouth daily. 30 capsule 2   metoprolol  succinate (TOPROL -XL) 25 MG 24 hr tablet TAKE 1 TABLET BY MOUTH ONCE A DAY 30 tablet 11   Multiple Vitamin (MULTIVITAMIN WITH MINERALS) TABS tablet Take 1 tablet by mouth every other day.     nitroGLYCERIN  (NITROSTAT ) 0.4 MG SL tablet DISSOLVE 1 TABLET UNDER TONGUE AS NEEDEDFOR CHEST PAIN. MAY REPEAT 5 MINUTES APART 3 TIMES IF NEEDED 25 tablet 3   omeprazole (PRILOSEC) 20 MG capsule Take 20 mg by mouth daily.     Plecanatide  3 MG TABS Take 3 mg by mouth daily. Replaces (Linaclotide ) Linzess      Polyethyl Glycol-Propyl Glycol (SYSTANE OP) Place 1 drop into both eyes daily as needed (burning eyes).      pseudoephedrine -guaifenesin  (MUCINEX  D) 60-600 MG 12 hr tablet Take 1 tablet by mouth at bedtime. 30 tablet 3   triamcinolone  cream (KENALOG ) 0.1 %  APPLY 1 APPLICATION TOPICALLY TWO TIMES DAILY AS NEEDED AVOIDING FACE, GROIN ANDARMPITS 80 g 0   No facility-administered medications prior to visit.    ROS: Review of Systems  Constitutional:  Negative for appetite change, fatigue and unexpected weight change.  HENT:  Positive for congestion, rhinorrhea and sneezing. Negative for nosebleeds, sore throat and trouble swallowing.   Eyes:  Negative for itching and visual disturbance.  Respiratory:  Positive for cough and shortness of breath. Negative for wheezing.   Cardiovascular:  Negative for chest pain, palpitations and leg swelling.  Gastrointestinal:  Negative for abdominal distention, blood in stool, diarrhea and nausea.  Genitourinary:  Negative for frequency and hematuria.  Musculoskeletal:  Negative for back pain, gait problem, joint swelling and neck pain.  Skin:  Negative for rash.  Neurological:  Negative for dizziness, tremors, speech difficulty and weakness.  Psychiatric/Behavioral:  Positive for decreased concentration. Negative for agitation, dysphoric mood and sleep disturbance. The patient is not nervous/anxious.     Objective:  BP 138/66   Pulse 70   Temp 98.3 F (36.8 C) (Oral)   Ht 5' 6 (1.676 m)   Wt 125 lb 3.2 oz (56.8 kg)   SpO2 94%   BMI 20.21 kg/m   BP Readings from Last 3 Encounters:  08/03/24 138/66  07/28/24 120/60  05/25/24 110/68    Wt Readings from Last 3 Encounters:  08/03/24 125 lb 3.2 oz (56.8 kg)  07/28/24 126 lb (57.2 kg)  05/25/24 125 lb (56.7 kg)    Physical Exam Constitutional:      General: He is not in acute distress.    Appearance: He is well-developed.     Comments: NAD  Eyes:     Conjunctiva/sclera: Conjunctivae normal.     Pupils: Pupils are equal, round, and reactive to light.  Neck:     Thyroid : No thyromegaly.     Vascular: No JVD.  Cardiovascular:     Rate and Rhythm: Normal rate and regular rhythm.     Heart sounds: Normal heart sounds. No murmur heard.    No  friction rub. No gallop.  Pulmonary:     Effort: Pulmonary effort is normal. No respiratory distress.     Breath sounds: Normal breath sounds. No wheezing or rales.  Chest:     Chest wall: No tenderness.  Abdominal:     General: Bowel sounds are normal. There is no distension.     Palpations: Abdomen is soft. There is no mass.     Tenderness: There is no abdominal tenderness. There is no guarding or rebound.  Musculoskeletal:        General: No tenderness. Normal range of motion.     Cervical back: Normal range of motion.     Right lower leg: No edema.     Left lower leg: No edema.  Lymphadenopathy:     Cervical: No cervical adenopathy.  Skin:    General: Skin is warm and dry.     Findings: No rash.  Neurological:     Mental Status: He is alert and oriented to person, place, and time.     Cranial Nerves: No cranial nerve deficit.     Motor: No abnormal muscle tone.     Coordination: Coordination normal.     Gait: Gait normal.     Deep Tendon Reflexes: Reflexes are normal and symmetric.  Psychiatric:        Behavior: Behavior normal.        Thought Content: Thought content normal.        Judgment: Judgment normal.   Gynecomastia R>L, soft B crackles  Lab Results  Component Value Date   WBC 5.1 02/20/2024   HGB 10.8 (L) 02/20/2024   HCT 33.2 (L) 02/20/2024   PLT 158 02/20/2024   GLUCOSE 108 (H) 07/28/2024   CHOL 93 09/30/2022   TRIG 113.0 09/30/2022   HDL 44.80 09/30/2022   LDLCALC 26 09/30/2022   ALT 13 12/25/2023   AST 25 12/25/2023   NA 137 07/28/2024   K 4.4 07/28/2024   CL 99 07/28/2024   CREATININE 1.24 07/28/2024   BUN 20 07/28/2024   CO2 26 07/28/2024   TSH 2.231 02/20/2024   PSA 12.45 (H) 02/06/2016   INR 1.1 04/02/2017   HGBA1C 6.1 12/30/2019    CT Chest Wo Contrast Result Date: 02/20/2024 CLINICAL DATA:  Chronic dyspnea.  Cardiac PET CT 01/22/2024. EXAM: CT CHEST WITHOUT CONTRAST TECHNIQUE: Multidetector CT imaging of the chest was performed  following the standard protocol without IV contrast. RADIATION DOSE REDUCTION: This exam was performed according to the departmental dose-optimization program which includes automated exposure control, adjustment of the mA and/or kV according to patient size and/or use of iterative reconstruction technique. COMPARISON:  CT angiogram chest 11/20/2022. FINDINGS: Cardiovascular: Heart is mildly enlarged. There is no pericardial effusion. Aorta is normal in size. Left-sided pacemaker is present. Mediastinum/Nodes: There are calcified subcarinal and right hilar lymph nodes. No enlarged lymph nodes are seen. Visualized esophagus and thyroid  gland are within normal limits. Lungs/Pleura: Examination is limited secondary to respiratory motion artifact. There are minimal tree-in-bud opacities in the left upper lobe. Peripheral reticular opacities are seen in the bilateral lower lungs/right greater than left. There is mild patchy airspace opacity in the right middle lobe. No pleural effusion or pneumothorax. No ground-glass opacities. There is a ground-glass nodule in the left lower lobe image 5/82 measuring 7 mm which is unchanged from 2023 study. Upper Abdomen: No acute abnormality. Musculoskeletal: Sternotomy wires are present.  No acute fracture. IMPRESSION: 1. Mild patchy airspace opacity in the right middle lobe may represent atelectasis or infection. 2. Minimal tree-in-bud opacities in the left upper lobe may be infectious/inflammatory. 3. Peripheral reticular opacities in the bilateral lower lungs/right greater than left, likely fibrotic changes. 4. Stable 7 mm ground-glass nodule in the left lower lobe unchanged from 2023. Continued follow-up up by CT is recommended in 12 months, with continued annual surveillance for a minimum of 3 years to exclude adenocarcinoma. recommendations are taken from: Recommendations for the Management of Subsolid Pulmonary Nodules Detected at CT: A  Statement from the Fleischner Society  Radiology 2013; 266:1, 304-317. 5. Mild cardiomegaly. Electronically Signed   By: Greig Pique M.D.   On: 02/20/2024 18:05    Assessment & Plan:   Problem List Items Addressed This Visit     Anxiety disorder    Lexapro  at hs      Atrial flutter (HCC)   On Eliquis       Cough in adult   Post-COVID cough High res CT chest w/bronchiectasis and pulm fibrosis      Relevant Orders   CT Chest High Resolution   Gynecomastia   R>L We are getting a high res CT chest Mammo if not better      Relevant Orders   CT Chest High Resolution   Lung infiltrate   2023 CT chest w/bronchiectasis and pulm fibrosis      Relevant Orders   CT Chest High Resolution   Memory problem   Stable      Persistent atrial fibrillation (HCC)   Eliquis  Toprol       Other Visit Diagnoses       Immunization due    -  Primary   Relevant Orders   Flu vaccine HIGH DOSE PF(Fluzone Trivalent) (Completed)         Meds ordered this encounter  Medications   cetirizine  (ZYRTEC ) 10 MG tablet    Sig: Take 1 tablet (10 mg total) by mouth daily.    Dispense:  90 tablet    Refill:  3      Follow-up: Return in about 2 months (around 10/03/2024) for a follow-up visit.  Marolyn Noel, MD

## 2024-08-10 ENCOUNTER — Other Ambulatory Visit

## 2024-08-10 ENCOUNTER — Ambulatory Visit: Admitting: Internal Medicine

## 2024-08-11 ENCOUNTER — Telehealth: Payer: Self-pay | Admitting: Cardiology

## 2024-08-11 NOTE — Telephone Encounter (Signed)
 Faxed as requested

## 2024-08-11 NOTE — Telephone Encounter (Signed)
 [name] Christensen from [facility] is requesting the recent EKG to be faxed over to them at [phone]. Please advise

## 2024-08-11 NOTE — Telephone Encounter (Signed)
 Pt states at ov on 07/28/24 Dr Cindie was suppose to send a copy of labs and ekg to [facility] in [place]. He just checked with the [facility] and they don't have it. He would like for us  to send and call to let him know it was sent.

## 2024-08-12 ENCOUNTER — Ambulatory Visit
Admission: RE | Admit: 2024-08-12 | Discharge: 2024-08-12 | Disposition: A | Discharge status: Home / Self Care | Source: Ambulatory Visit | Attending: Internal Medicine | Admitting: Internal Medicine

## 2024-08-12 DIAGNOSIS — N62 Hypertrophy of breast: Secondary | ICD-10-CM

## 2024-08-12 DIAGNOSIS — J479 Bronchiectasis, uncomplicated: Secondary | ICD-10-CM | POA: Diagnosis not present

## 2024-08-12 DIAGNOSIS — R918 Other nonspecific abnormal finding of lung field: Secondary | ICD-10-CM

## 2024-08-12 DIAGNOSIS — R059 Cough, unspecified: Secondary | ICD-10-CM

## 2024-08-18 ENCOUNTER — Ambulatory Visit: Payer: Self-pay | Admitting: Internal Medicine

## 2024-08-18 DIAGNOSIS — J479 Bronchiectasis, uncomplicated: Secondary | ICD-10-CM

## 2024-08-18 DIAGNOSIS — R053 Chronic cough: Secondary | ICD-10-CM

## 2024-08-18 DIAGNOSIS — R918 Other nonspecific abnormal finding of lung field: Secondary | ICD-10-CM

## 2024-08-18 DIAGNOSIS — R059 Cough, unspecified: Secondary | ICD-10-CM

## 2024-08-23 NOTE — Progress Notes (Signed)
 Remote ICD Transmission

## 2024-08-24 DIAGNOSIS — R053 Chronic cough: Secondary | ICD-10-CM | POA: Diagnosis not present

## 2024-08-24 DIAGNOSIS — J479 Bronchiectasis, uncomplicated: Secondary | ICD-10-CM | POA: Diagnosis not present

## 2024-09-06 ENCOUNTER — Other Ambulatory Visit: Payer: Self-pay | Admitting: Internal Medicine

## 2024-09-20 ENCOUNTER — Ambulatory Visit: Payer: Self-pay

## 2024-09-20 NOTE — Telephone Encounter (Signed)
 FYI Only or Action Required?: FYI only for provider.  Patient was last seen in primary care on 08/03/2024 by Plotnikov, Karlynn GAILS, MD.  Called Nurse Triage reporting Depression.  Symptoms began a week ago.  Interventions attempted: Other: de-escalation and redirection.  Symptoms are: stable.  Triage Disposition: See Physician Within 24 Hours  Patient/caregiver understands and will follow disposition?: Yes Call rec'd from wife expressing concern about patient seeming depressed.  She feels his s/s are related to his upcoming 89th bday.  Pt can be heard in the background and eventually he interrupted the call and asked this RN what did his wife Dickey call about.  This RN explained that there was some concern for depression. Pt admitted that he does feel some depression but he does not want to discuss them with this RN.  Pt denied any suicidal/homicidal ideations and gave the phone back to his to continue talking. Wife Dickey wishes to speak with Dr. Garald concerning patients symptoms and exploring medications.  Appt scheduled for 10/28 at 0930.  Wife Dickey expresses no safety concerns at this time. She endorses support in caring for patient from her son and she denies that there are any weapons in the home.  Will route HP to clinic for close following.   Copied from CRM #[id number]. Topic: Clinical - Red Word Triage >> Sep 20, 2024 11:50 AM Adelita E wrote: [facility] that prompted transfer to Nurse Triage: Patient's wife, Dickey, called in stating that patient is having worsening depression over the past week. Wife noticed a change in personality, and questioning if it is potentially related to psychological issues due to his 89th birthday coming up. >> Sep 20, 2024 12:05 PM Mercer PEDLAR wrote: Patient is returning NT call, transferred to NT.  Reason for Disposition  [1] Depression AND [2] getting worse (e.g., sleeping poorly, less able to do activities of daily living)  Answer Assessment - Initial  Assessment Questions 1. CONCERN: What happened that made you call today?     Wife called and expressed concern for patient experiencing depression.  She said she seems to a heightened interest in things he is not normally concerned about.    2. DEPRESSION SYMPTOM SCREENING: How are you feeling overall? (e.g., decreased energy, increased sleeping or difficulty sleeping, difficulty concentrating, feelings of sadness, guilt, hopelessness, or worthlessness)     *No Answer*  3. RISK OF HARM - SUICIDAL IDEATION:  Do you ever have thoughts of hurting or killing yourself?  (e.g., yes, no, no but preoccupation with thoughts about death)     Patient denies thoughts of suicide  4. RISK OF HARM - HOMICIDAL IDEATION:  Do you ever have thoughts of hurting or killing someone else?  (e.g., yes, no, no but preoccupation with thoughts about death)     Patient denies thoughts of homicide  5. FUNCTIONAL IMPAIRMENT: How have things been going for you overall? Have you had more difficulty than usual doing your normal daily activities?  (e.g., better, same, worse; self-care, school, work, interactions)     *No Answer* 6. SUPPORT: Who is with you now? Who do you live with? Do you have family or friends who you can talk to?      Patient is currently with his wife  7. THERAPIST: Do you have a counselor or therapist? If Yes, ask: What is their name?     *No Answer* 8. STRESSORS: Has there been any new stress or recent changes in your life?     *No  Answer* 9. ALCOHOL  USE OR SUBSTANCE USE (DRUG USE): Do you drink alcohol  or use any illegal drugs?     *No Answer* 10. OTHER: Do you have any other physical symptoms right now? (e.g., fever)       *No Answer* 11. PREGNANCY: Is there any chance you are pregnant? When was your last menstrual period?       *No Answer*  Protocols used: Depression-A-AH

## 2024-09-20 NOTE — Telephone Encounter (Signed)
 Patient called back and states everything's about normal and denies any symptoms. Advised patient his wife had called in and had some concerns which he states he was not aware of, patient attempted to put wife on for triage but she states she is already speaking with a triage RN at this time. Signing this encounter as this is duplicate, see RN Cherelle's triage encounter today.     Copied from CRM #[id number]. Topic: Clinical - Red Word Triage >> Sep 20, 2024 11:50 AM Adelita E wrote: [facility] that prompted transfer to Nurse Triage: Patient's wife, Dickey, called in stating that patient is having worsening depression over the past week. Wife noticed a change in personality, and questioning if it is potentially related to psychological issues due to his 89th birthday coming up.     Reason for Disposition  Caller has already spoken with another triager and has no further questions.  Protocols used: No Contact or Duplicate Contact Call-A-AH

## 2024-09-20 NOTE — Telephone Encounter (Signed)
 Copied from CRM #[id number]. Topic: Clinical - Red Word Triage >> Sep 20, 2024 11:50 AM Adelita E wrote: [facility] that prompted transfer to Nurse Triage: Patient's wife, Dickey, called in stating that patient is having worsening depression over the past week. Wife noticed a change in personality, and questioning if it is potentially related to psychological issues due to his 89th birthday coming up.

## 2024-09-21 ENCOUNTER — Ambulatory Visit: Admitting: Internal Medicine

## 2024-09-21 ENCOUNTER — Encounter: Payer: Self-pay | Admitting: Internal Medicine

## 2024-09-21 VITALS — BP 128/84 | HR 75 | Temp 98.3°F | Ht 66.0 in | Wt 125.0 lb

## 2024-09-21 DIAGNOSIS — J479 Bronchiectasis, uncomplicated: Secondary | ICD-10-CM

## 2024-09-21 DIAGNOSIS — R634 Abnormal weight loss: Secondary | ICD-10-CM | POA: Diagnosis not present

## 2024-09-21 DIAGNOSIS — F419 Anxiety disorder, unspecified: Secondary | ICD-10-CM | POA: Diagnosis not present

## 2024-09-21 DIAGNOSIS — I4892 Unspecified atrial flutter: Secondary | ICD-10-CM | POA: Diagnosis not present

## 2024-09-21 DIAGNOSIS — I2510 Atherosclerotic heart disease of native coronary artery without angina pectoris: Secondary | ICD-10-CM

## 2024-09-21 DIAGNOSIS — N3090 Cystitis, unspecified without hematuria: Secondary | ICD-10-CM | POA: Diagnosis not present

## 2024-09-21 DIAGNOSIS — A310 Pulmonary mycobacterial infection: Secondary | ICD-10-CM | POA: Diagnosis not present

## 2024-09-21 NOTE — Patient Instructions (Signed)
 USE a FLUTTER VALVE

## 2024-09-21 NOTE — Assessment & Plan Note (Signed)
 Lexapro  at hs

## 2024-09-21 NOTE — Assessment & Plan Note (Addendum)
 On Doxy prn See Dr Theotis q 12 months He has bronchiectasis concern about his ct scan. Reassured and spent 40 minutes just going over his chest ct, word for word, answered all of his question. Chest overall no change. No fibrosis, pneumonia. Prior soutum negative for MAI. Will continue to follow. HE IS NOT USING HIS FLUTTER VALVE Asked to do so.

## 2024-09-21 NOTE — Assessment & Plan Note (Signed)
Cont on Atenolol, Eliquis Lipitor, Losartan

## 2024-09-21 NOTE — Progress Notes (Signed)
 Subjective:  Patient ID: [name] Christensen, male    DOB: 08/19/1932  Age: 89 y.o. MRN: [id number]  CC: Depression   HPI [name] Christensen presents for chronic cough, dementia, GERD, CAD He was taking duplicate [facility]  [facility]  Outpatient Medications Prior to Visit  Medication Sig Dispense Refill   acetaminophen  (TYLENOL ) 500 MG tablet Take 500 mg by mouth at bedtime.     alfuzosin  (UROXATRAL ) 10 MG 24 hr tablet Take 10 mg by mouth every evening.      apixaban  (ELIQUIS ) 2.5 MG TABS tablet Take 1 tablet (2.5 mg total) by mouth 2 (two) times daily. 180 tablet 3   atorvastatin  (LIPITOR) 40 MG tablet Take 20 mg by mouth daily.     b complex vitamins capsule Take 1 capsule by mouth daily. 100 capsule 3   B Complex-C-Calcium  (GNP B-COMPLEX PLUS VITAMIN C ) TABS TAKE ONE TABLET BY MOUTH DAILY. 100 tablet 3   betamethasone  dipropionate 0.05 % cream Apply 1 application. topically 2 (two) times daily as needed.     cetirizine  (ZYRTEC ) 10 MG tablet Take 1 tablet (10 mg total) by mouth daily. 90 tablet 3   cinacalcet  (SENSIPAR ) 30 MG tablet Take 1 tablet (30 mg total) by mouth 3 (three) times a week. 25 tablet 0   cyanocobalamin  (VITAMIN B12) 1000 MCG tablet Take 1 tablet (1,000 mcg total) by mouth every other day as needed. 100 tablet 3   Dextromethorphan-guaiFENesin  (MUCINEX  DM PO) Take 30 mg by mouth in the morning and at bedtime.     dofetilide  (TIKOSYN ) 125 MCG capsule Take 1 capsule (125 mcg total) by mouth 2 (two) times daily. 180 capsule 2   donepezil  (ARICEPT ) 5 MG tablet TAKE ONE TABLET BY MOUTH EVERY NIGHT AT BEDTIME 90 tablet 3   doxycycline  (VIBRA -TABS) 100 MG tablet Take 1 tablet (100 mg total) by mouth 2 (two) times daily. 60 tablet 1   dutasteride  (AVODART ) 0.5 MG capsule Take 0.5 mg by mouth every evening.      escitalopram  (LEXAPRO ) 10 MG tablet Take 1 tablet (10 mg total) by mouth at bedtime. 90 tablet 1   famotidine  (PEPCID ) 20 MG tablet Take 20 mg by mouth  daily.     guaiFENesin  (MUCINEX ) 600 MG 12 hr tablet Take 600 mg by mouth as needed for cough.     HYDROcodone  bit-homatropine (HYCODAN) 5-1.5 MG/5ML syrup TAKE FIVE MLS (1 TEASPOONFUL) BY MOUTH EVERY SIX HOURS AS NEEDED FOR COUGH. 240 mL 0   ipratropium (ATROVENT) 0.03 % nasal spray Place 2 sprays into both nostrils 3 (three) times daily.     ipratropium-albuterol  (DUONEB) 0.5-2.5 (3) MG/3ML SOLN Take 3 mLs by nebulization every 6 (six) hours as needed. 360 mL 3   iron  polysaccharides (NIFEREX) 150 MG capsule Take 1 capsule (150 mg total) by mouth daily. 30 capsule 2   metoprolol  succinate (TOPROL -XL) 25 MG 24 hr tablet TAKE 1 TABLET BY MOUTH ONCE A DAY 30 tablet 11   nitroGLYCERIN  (NITROSTAT ) 0.4 MG SL tablet DISSOLVE 1 TABLET UNDER TONGUE AS NEEDEDFOR CHEST PAIN. MAY REPEAT 5 MINUTES APART 3 TIMES IF NEEDED 25 tablet 3   omeprazole (PRILOSEC) 20 MG capsule Take 20 mg by mouth daily.     Plecanatide  3 MG TABS Take 3 mg by mouth daily. Replaces (Linaclotide ) Linzess      Polyethyl Glycol-Propyl Glycol (SYSTANE OP) Place 1 drop into both eyes daily as needed (burning eyes).      pseudoephedrine -guaifenesin  (MUCINEX  D) 60-600  MG 12 hr tablet Take 1 tablet by mouth at bedtime. 30 tablet 3   triamcinolone  cream (KENALOG ) 0.1 % APPLY 1 APPLICATION TOPICALLY TWO TIMES DAILY AS NEEDED AVOIDING FACE, GROIN ANDARMPITS 80 g 0   ascorbic acid (VITAMIN C ) 250 MG tablet Take 250 mg by mouth daily.     Multiple Vitamin (MULTIVITAMIN WITH MINERALS) TABS tablet Take 1 tablet by mouth every other day.     No facility-administered medications prior to visit.    ROS: Review of Systems  Constitutional:  Negative for appetite change, fatigue and unexpected weight change.  HENT:  Negative for congestion, nosebleeds, sneezing, sore throat and trouble swallowing.   Eyes:  Negative for itching and visual disturbance.  Respiratory:  Positive for cough.   Cardiovascular:  Negative for chest pain, palpitations and leg  swelling.  Gastrointestinal:  Negative for abdominal distention, blood in stool, diarrhea and nausea.  Genitourinary:  Negative for frequency and hematuria.  Musculoskeletal:  Negative for back pain, gait problem, joint swelling and neck pain.  Skin:  Negative for rash.  Neurological:  Negative for dizziness, tremors, speech difficulty and weakness.  Psychiatric/Behavioral:  Positive for decreased concentration and dysphoric mood. Negative for agitation, sleep disturbance and suicidal ideas. The patient is nervous/anxious.     Objective:  BP 128/84   Pulse 75   Temp 98.3 F (36.8 C)   Ht 5' 6 (1.676 m)   Wt 125 lb (56.7 kg)   SpO2 96%   BMI 20.18 kg/m   BP Readings from Last 3 Encounters:  09/21/24 128/84  08/03/24 138/66  07/28/24 120/60    Wt Readings from Last 3 Encounters:  09/21/24 125 lb (56.7 kg)  08/03/24 125 lb 3.2 oz (56.8 kg)  07/28/24 126 lb (57.2 kg)    Physical Exam Constitutional:      General: He is not in acute distress.    Appearance: Normal appearance. He is well-developed.     Comments: NAD  Eyes:     Conjunctiva/sclera: Conjunctivae normal.     Pupils: Pupils are equal, round, and reactive to light.  Neck:     Thyroid : No thyromegaly.     Vascular: No JVD.  Cardiovascular:     Rate and Rhythm: Normal rate and regular rhythm.     Heart sounds: Normal heart sounds. No murmur heard.    No friction rub. No gallop.  Pulmonary:     Effort: Pulmonary effort is normal. No respiratory distress.     Breath sounds: Normal breath sounds. No wheezing or rales.  Chest:     Chest wall: No tenderness.  Abdominal:     General: Bowel sounds are normal. There is no distension.     Palpations: Abdomen is soft. There is no mass.     Tenderness: There is no abdominal tenderness. There is no guarding or rebound.  Musculoskeletal:        General: No tenderness. Normal range of motion.     Cervical back: Normal range of motion.  Lymphadenopathy:     Cervical:  No cervical adenopathy.  Skin:    General: Skin is warm and dry.     Findings: No rash.  Neurological:     Mental Status: He is alert and oriented to person, place, and time.     Cranial Nerves: No cranial nerve deficit.     Motor: No abnormal muscle tone.     Coordination: Coordination normal.     Gait: Gait normal.     Deep  Tendon Reflexes: Reflexes are normal and symmetric.  Psychiatric:        Behavior: Behavior normal.        Thought Content: Thought content normal.        Judgment: Judgment normal.     Lab Results  Component Value Date   WBC 5.1 02/20/2024   HGB 10.8 (L) 02/20/2024   HCT 33.2 (L) 02/20/2024   PLT 158 02/20/2024   GLUCOSE 108 (H) 07/28/2024   CHOL 93 09/30/2022   TRIG 113.0 09/30/2022   HDL 44.80 09/30/2022   LDLCALC 26 09/30/2022   ALT 13 12/25/2023   AST 25 12/25/2023   NA 137 07/28/2024   K 4.4 07/28/2024   CL 99 07/28/2024   CREATININE 1.24 07/28/2024   BUN 20 07/28/2024   CO2 26 07/28/2024   TSH 2.231 02/20/2024   PSA 12.45 (H) 02/06/2016   INR 1.1 04/02/2017   HGBA1C 6.1 12/30/2019    CT Chest High Resolution Result Date: 08/17/2024 CLINICAL DATA:  Abnormal x-ray, chronic post COVID cough, abnormal chest CT EXAM: CT CHEST WITHOUT CONTRAST TECHNIQUE: Multidetector CT imaging of the chest was performed following the standard protocol without intravenous contrast. High resolution imaging of the lungs, as well as inspiratory and expiratory imaging, was performed. RADIATION DOSE REDUCTION: This exam was performed according to the departmental dose-optimization program which includes automated exposure control, adjustment of the mA and/or kV according to patient size and/or use of iterative reconstruction technique. COMPARISON:  02/20/2024 FINDINGS: Cardiovascular: Aortic atherosclerosis. Left chest multi lead pacer defibrillator. Normal heart size. Extensive three-vessel coronary artery calcifications status post median sternotomy and CABG. No  pericardial effusion. Mediastinum/Nodes: No enlarged mediastinal, hilar, or axillary lymph nodes. Thyroid  gland, trachea, and esophagus demonstrate no significant findings. Lungs/Pleura: Very extensive clustered centrilobular and tree-in-bud nodularity and consolidation throughout the bilateral lung bases, worse on the right and particularly in the right middle lobe (series 5, image 124). Mild diffuse bilateral bronchial wall thickening with varicoid bronchiectasis and bronchiolar plugging in the deep lung bases. Overall findings are similar to prior examination. No significant air trapping on expiratory phase imaging. No pleural effusion or pneumothorax. Upper Abdomen: No acute abnormality.  Cholecystectomy. Musculoskeletal: No chest wall abnormality. No acute osseous findings. IMPRESSION: 1. Very extensive clustered centrilobular and tree-in-bud nodularity and consolidation throughout the bilateral lung bases, worse on the right and particularly in the right middle lobe. Mild diffuse bilateral bronchial wall thickening with varicoid bronchiectasis and bronchiolar plugging in the deep lung bases. Overall findings are similar to prior examination and consistent with atypical mycobacterial infection and associated chronic sequelae. 2. No evidence of fibrotic interstitial lung disease. 3. Coronary artery disease. Aortic Atherosclerosis (ICD10-I70.0). Electronically Signed   By: Marolyn JONETTA Jaksch M.D.   On: 08/17/2024 17:23    Assessment & Plan:   Problem List Items Addressed This Visit     Anxiety disorder - Primary    Lexapro  at hs      Atrial flutter (HCC)   On Eliquis       Bronchiectasis (HCC) assoc with possible MAI   On Doxy      CAD (coronary artery disease)   Cont on Atenolol , Eliquis  Lipitor, Losartan       Pulmonary Mycobacterium avium complex (MAC) infection (HCC)   See Dr Theotis q 12 months       Weight loss   Wt Readings from Last 3 Encounters:  09/21/24 125 lb (56.7 kg)   08/03/24 125 lb 3.2 oz (56.8 kg)  07/28/24 126  lb (57.2 kg)            No orders of the defined types were placed in this encounter.     Follow-up: Return in about 3 months (around 12/22/2024) for a follow-up visit.  Marolyn Noel, MD

## 2024-09-21 NOTE — Assessment & Plan Note (Addendum)
 On Eliquis  He was taking duplicate metoprolol  and several vitamins from [facility]

## 2024-09-21 NOTE — Assessment & Plan Note (Addendum)
 See Dr Theotis q 12 months He has bronchiectasis concern about his ct scan. Reassured and spent 40 minutes just going over his chest ct, word for word, answered all of his question. Chest overall no change. No fibrosis, pneumonia. Prior soutum negative for MAI. Will continue to follow. HE IS NOT USING HIS FLUTTER VALVE Asked to do so.

## 2024-09-21 NOTE — Assessment & Plan Note (Signed)
 Wt Readings from Last 3 Encounters:  09/21/24 125 lb (56.7 kg)  08/03/24 125 lb 3.2 oz (56.8 kg)  07/28/24 126 lb (57.2 kg)

## 2024-09-29 ENCOUNTER — Ambulatory Visit: Payer: PPO

## 2024-09-29 DIAGNOSIS — I4819 Other persistent atrial fibrillation: Secondary | ICD-10-CM | POA: Diagnosis not present

## 2024-09-30 LAB — CUP PACEART REMOTE DEVICE CHECK
Battery Remaining Longevity: 19 mo
Battery Remaining Percentage: 22 %
Battery Voltage: 2.78 V
Brady Statistic AP VP Percent: 1.2 %
Brady Statistic AP VS Percent: 83 %
Brady Statistic AS VP Percent: 1 %
Brady Statistic AS VS Percent: 16 %
Brady Statistic RA Percent Paced: 81 %
Brady Statistic RV Percent Paced: 1.3 %
Date Time Interrogation Session: 20251105020016
HighPow Impedance: 70 Ohm
HighPow Impedance: 70 Ohm
Implantable Lead Connection Status: 753985
Implantable Lead Connection Status: 753985
Implantable Lead Implant Date: 20131218
Implantable Lead Implant Date: 20180514
Implantable Lead Location: 753859
Implantable Lead Location: 753860
Implantable Lead Model: 5076
Implantable Pulse Generator Implant Date: 20180514
Lead Channel Impedance Value: 380 Ohm
Lead Channel Impedance Value: 510 Ohm
Lead Channel Pacing Threshold Amplitude: 0.5 V
Lead Channel Pacing Threshold Amplitude: 1.25 V
Lead Channel Pacing Threshold Pulse Width: 0.5 ms
Lead Channel Pacing Threshold Pulse Width: 0.5 ms
Lead Channel Sensing Intrinsic Amplitude: 2.9 mV
Lead Channel Sensing Intrinsic Amplitude: 6.4 mV
Lead Channel Setting Pacing Amplitude: 1.5 V
Lead Channel Setting Pacing Amplitude: 2 V
Lead Channel Setting Pacing Pulse Width: 0.5 ms
Lead Channel Setting Sensing Sensitivity: 0.5 mV
Pulse Gen Serial Number: 1245762
Zone Setting Status: 755011

## 2024-09-30 NOTE — Progress Notes (Signed)
 Remote ICD Transmission

## 2024-10-04 ENCOUNTER — Telehealth: Payer: Self-pay

## 2024-10-04 NOTE — Telephone Encounter (Signed)
 ABBOTT ICD alert:   Device flagged 8 NSVT events, longest duration 1 minute 24 secs.  EGMS appear consistent with atrial driven tachycardia.    Numerous AMS events also consistent with atrial driven tach.   Patient has intrinsically long AV delay - (see prior events for comparison).  Morphology also appears same as presenting and discriminator does not appear VT.   V-A too long to be AVNRT as possibly suspected by [employer].  Patient contacted to assess symptoms as all events occurred on 10/02/24.  States he worked out at [employer] on Saturday.  No symptoms, except maybe a little more fatigue that day but overall is doing well.  No complaints or concerns.   Will continue to monitor.

## 2024-10-10 ENCOUNTER — Ambulatory Visit: Payer: Self-pay | Admitting: Cardiology

## 2024-10-20 DIAGNOSIS — N401 Enlarged prostate with lower urinary tract symptoms: Secondary | ICD-10-CM | POA: Diagnosis not present

## 2024-10-20 DIAGNOSIS — R3912 Poor urinary stream: Secondary | ICD-10-CM | POA: Diagnosis not present

## 2024-10-24 ENCOUNTER — Encounter: Payer: Self-pay | Admitting: Internal Medicine

## 2024-10-27 ENCOUNTER — Encounter: Admitting: Cardiology

## 2024-11-22 NOTE — Progress Notes (Unsigned)
 " Cardiology Office Note:  .   Date:  11/24/2024  ID:  [name] Christensen, DOB 01-24-1932, MRN [id number] PCP: Garald Karlynn GAILS, MD  [facility] Providers Cardiologist:  Lonni Hanson, MD Electrophysiologist:  Elspeth Sage, MD (Inactive)     History of Present Illness: .   [name] Christensen is a 89 y.o. male with history of coronary artery disease status post remote CABG, chronic heart failure with recovered ejection fraction, cardiac arrest status post ICD, persistent atrial fibrillation, symptomatic bradycardia, hypertension, and hyperlipidemia, who has been referred by Dr. Cindie to establish general cardiology care.  He was previously followed many years ago by Dr. Delford and subsequently Dr. Sage.  Today, [name] Christensen is most concerned about the battery in his ICD running out.  He has not had any shocks or alarms from the device.  He remains active, going to the gym 6 days a week.  He notes some transient chest tightness when he overexerts himself, though this resolves promptly with rest.  Overall, this is stable compared to a year ago.  He denies shortness of breath, palpitations, lightheadedness, and edema.  [name] Christensen notes that he becomes fatigued in the afternoons.  His wife also mentions that [name] Christensen often times sleeps in the afternoon and is awake for much of the night.  ROS: See HPI  Studies Reviewed: SABRA   EKG Interpretation Date/Time:  Wednesday November 24 2024 09:56:16 EST Ventricular Rate:  75 PR Interval:  320 QRS Duration:  166 QT Interval:  420 QTC Calculation: 470 R Axis:   249  Text Interpretation: Sinus rhythm with 1st degree A-V block with Premature ventricular complexes Right bundle branch block Inferior infarct (cited on or before 24-Nov-2024) When compared with ECG of 28-Jul-2024 11:08, Sinus rhythm has replaced Electronic atrial pacemaker Confirmed by Brigham Cobbins [phone]) on 11/24/2024 1:02:43 PM Also confirmed by Hanson Lonni [phone])  on  11/24/2024 1:13:45 PM    Myocardial PET/CT (01/22/2024): Intermediate risk study with apical to basal lateral scar.  No significant ischemia identified.  Normal global myocardial blood flow reserve.  LVEF 49% at rest and 50% at stress.  LHC (05/14/2019): Severe native CAD with 60% mid LAD stenosis, 60% in-stent restenosis of OM1, 60% distal LCx lesion, and chronic total occlusion RCA.  SVG-D2, SVG-OM1, and SVG-distal RCA.  LIMA-LAD known to be atretic.  Risk Assessment/Calculations:    CHA2DS2-VASc Score = 5   This indicates a 7.2% annual risk of stroke. The patient's score is based upon: CHF History: 1 HTN History: 1 Diabetes History: 0 Stroke History: 0 Vascular Disease History: 1 Age Score: 2 Gender Score: 0            Physical Exam:   VS:  BP 126/70 (BP Location: Left Arm, Patient Position: Sitting, Cuff Size: Normal)   Pulse 75   Ht 5' 6 (1.676 m)   Wt 129 lb (58.5 kg)   SpO2 96%   BMI 20.82 kg/m    Wt Readings from Last 3 Encounters:  11/24/24 129 lb (58.5 kg)  09/21/24 125 lb (56.7 kg)  08/03/24 125 lb 3.2 oz (56.8 kg)    General:  NAD. Neck: No JVD or HJR. Lungs: Clear to auscultation bilaterally without wheezes or crackles. Heart: Regular rate and rhythm without murmurs, rubs, or gallops. Abdomen: Soft, nontender, nondistended. Extremities: No lower extremity edema.  ASSESSMENT AND PLAN: .    Coronary artery disease with stable angina: The adult and reports a long history of brief chest  tightness when he overexerts himself, which has been stable for at least a year.  He has not needed to use any sublingual nitroglycerin .  He will check his supply at home to make sure that it is not older than a year.  Continue secondary prevention of CAD with apixaban  and lieu of aspirin  as well as atorvastatin  and metoprolol  succinate.  Check CBC, CMP, and lipid panel today.  Chronic heart failure with recovered ejection fraction: [name] Christensen appears euvolemic with stable NYHA  class II symptoms.  Most recent echocardiogram through the [facility] in 03/2023 showed LVEF of 50% with basal anterolateral hypokinesis.  I will check a CBC and CMP today given [name] Christensen fatigue.  Persistent atrial fibrillation: EKG today shows sinus rhythm with first-degree AV block (though cannot exclude atrial pacing with prolonged AV delay).  Continue current regimen of apixaban  2.5 mg twice daily based on age and weight as well as dofetilide  and metoprolol  succinate.  [name] Christensen will follow-up with Dr. Kennyth in March for ongoing EP care.  Check CBC, CMP, TSH, and magnesium  today.  QTc fairly stable on today's EKG.  Sinus node dysfunction: Continue routine device monitoring through the device clinic.  Most recent interrogation in 09/2024 showed battery longevity estimated at 1 year, 7 months.  I reassured [name] Christensen that his device battery is not at imminent risk of failure and that our EP team will continue to monitor his device closely and discuss replacement as the device nears ERI.  Sleep disturbance: [name] Christensen notes afternoon fatigue, often falling asleep in the late afternoon and then being awake for much of the night/early evening hours.  We discussed importance of good sleep hygiene.  He notes that upper back pain sometimes makes it difficult for him to fall asleep and rest comfortably.  I will check labs today; I have also encouraged him to speak with Dr. Garald about his concerns at their upcoming visit.    Dispo: Follow-up with Dr. Kennyth as scheduled in March.  Return to see me in 6 months.  Signed, Lonni Hanson, MD  "

## 2024-11-24 ENCOUNTER — Encounter: Payer: Self-pay | Admitting: Internal Medicine

## 2024-11-24 ENCOUNTER — Ambulatory Visit: Admitting: Internal Medicine

## 2024-11-24 VITALS — BP 126/70 | HR 75 | Ht 66.0 in | Wt 129.0 lb

## 2024-11-24 DIAGNOSIS — I25118 Atherosclerotic heart disease of native coronary artery with other forms of angina pectoris: Secondary | ICD-10-CM | POA: Diagnosis not present

## 2024-11-24 DIAGNOSIS — G479 Sleep disorder, unspecified: Secondary | ICD-10-CM | POA: Diagnosis not present

## 2024-11-24 DIAGNOSIS — I4819 Other persistent atrial fibrillation: Secondary | ICD-10-CM | POA: Diagnosis not present

## 2024-11-24 DIAGNOSIS — I495 Sick sinus syndrome: Secondary | ICD-10-CM | POA: Diagnosis not present

## 2024-11-24 DIAGNOSIS — I5022 Chronic systolic (congestive) heart failure: Secondary | ICD-10-CM | POA: Diagnosis not present

## 2024-11-24 NOTE — Patient Instructions (Signed)
 Medication Instructions:  No changes *If you need a refill on your cardiac medications before your next appointment, please call your pharmacy*  Lab Work: Your provider would like for you to have the following labs today: Lipid, CBC, CMET, TSH and Magnesium   If you have labs (blood work) drawn today and your tests are completely normal, you will receive your results only by: MyChart Message (if you have MyChart) OR A paper copy in the mail If you have any lab test that is abnormal or we need to change your treatment, we will call you to review the results.  Testing/Procedures: None ordered  Follow-Up: At [facility], you and your health needs are our priority.  As part of our continuing mission to provide you with exceptional heart care, our providers are all part of one team.  This team includes your primary Cardiologist (physician) and Advanced Practice Providers or APPs (Physician Assistants and Nurse Practitioners) who all work together to provide you with the care you need, when you need it.  Your next appointment:   6 month(s)  Provider:   You may see Dr. Mady or one of the following Advanced Practice Providers on your designated Care Team:   Lonni Meager, NP Lesley Maffucci, PA-C Bernardino Bring, PA-C Cadence Kalona, PA-C Tylene Lunch, NP Barnie Hila, NP    We recommend signing up for the patient portal called MyChart.  Sign up information is provided on this After Visit Summary.  MyChart is used to connect with patients for Virtual Visits (Telemedicine).  Patients are able to view lab/test results, encounter notes, upcoming appointments, etc.  Non-urgent messages can be sent to your provider as well.   To learn more about what you can do with MyChart, go to [email or web address].

## 2024-11-25 ENCOUNTER — Ambulatory Visit: Payer: Self-pay | Admitting: Internal Medicine

## 2024-11-25 LAB — CBC
Hematocrit: 34.9 % — ABNORMAL LOW (ref 37.5–51.0)
Hemoglobin: 11 g/dL — ABNORMAL LOW (ref 13.0–17.7)
MCH: 30.8 pg (ref 26.6–33.0)
MCHC: 31.5 g/dL (ref 31.5–35.7)
MCV: 98 fL — ABNORMAL HIGH (ref 79–97)
Platelets: 141 x10E3/uL — ABNORMAL LOW (ref 150–450)
RBC: 3.57 x10E6/uL — ABNORMAL LOW (ref 4.14–5.80)
RDW: 11.9 % (ref 11.6–15.4)
WBC: 7.7 x10E3/uL (ref 3.4–10.8)

## 2024-11-25 LAB — COMPREHENSIVE METABOLIC PANEL WITH GFR
ALT: 23 IU/L (ref 0–44)
AST: 37 IU/L (ref 0–40)
Albumin: 3.8 g/dL (ref 3.6–4.6)
Alkaline Phosphatase: 110 IU/L (ref 48–129)
BUN/Creatinine Ratio: 17 (ref 10–24)
BUN: 22 mg/dL (ref 10–36)
Bilirubin Total: 0.4 mg/dL (ref 0.0–1.2)
CO2: 27 mmol/L (ref 20–29)
Calcium: 10.9 mg/dL — ABNORMAL HIGH (ref 8.6–10.2)
Chloride: 102 mmol/L (ref 96–106)
Creatinine, Ser: 1.28 mg/dL — ABNORMAL HIGH (ref 0.76–1.27)
Globulin, Total: 4 g/dL (ref 1.5–4.5)
Glucose: 89 mg/dL (ref 70–99)
Potassium: 4.4 mmol/L (ref 3.5–5.2)
Sodium: 141 mmol/L (ref 134–144)
Total Protein: 7.8 g/dL (ref 6.0–8.5)
eGFR: 53 mL/min/1.73 — ABNORMAL LOW

## 2024-11-25 LAB — LIPID PANEL
Chol/HDL Ratio: 2.4 ratio (ref 0.0–5.0)
Cholesterol, Total: 110 mg/dL (ref 100–199)
HDL: 46 mg/dL
LDL Chol Calc (NIH): 45 mg/dL (ref 0–99)
Triglycerides: 98 mg/dL (ref 0–149)
VLDL Cholesterol Cal: 19 mg/dL (ref 5–40)

## 2024-11-25 LAB — MAGNESIUM: Magnesium: 2.1 mg/dL (ref 1.6–2.3)

## 2024-11-25 LAB — TSH: TSH: 3.06 u[IU]/mL (ref 0.450–4.500)

## 2024-11-30 ENCOUNTER — Ambulatory Visit: Admitting: Dermatology

## 2024-11-30 ENCOUNTER — Encounter: Payer: Self-pay | Admitting: Dermatology

## 2024-11-30 DIAGNOSIS — C44329 Squamous cell carcinoma of skin of other parts of face: Secondary | ICD-10-CM

## 2024-11-30 DIAGNOSIS — L578 Other skin changes due to chronic exposure to nonionizing radiation: Secondary | ICD-10-CM | POA: Diagnosis not present

## 2024-11-30 DIAGNOSIS — Z1283 Encounter for screening for malignant neoplasm of skin: Secondary | ICD-10-CM

## 2024-11-30 DIAGNOSIS — L570 Actinic keratosis: Secondary | ICD-10-CM

## 2024-11-30 DIAGNOSIS — D492 Neoplasm of unspecified behavior of bone, soft tissue, and skin: Secondary | ICD-10-CM

## 2024-11-30 DIAGNOSIS — C44311 Basal cell carcinoma of skin of nose: Secondary | ICD-10-CM

## 2024-11-30 DIAGNOSIS — L814 Other melanin hyperpigmentation: Secondary | ICD-10-CM | POA: Diagnosis not present

## 2024-11-30 DIAGNOSIS — W908XXA Exposure to other nonionizing radiation, initial encounter: Secondary | ICD-10-CM

## 2024-11-30 DIAGNOSIS — L821 Other seborrheic keratosis: Secondary | ICD-10-CM

## 2024-11-30 DIAGNOSIS — D229 Melanocytic nevi, unspecified: Secondary | ICD-10-CM

## 2024-11-30 DIAGNOSIS — L820 Inflamed seborrheic keratosis: Secondary | ICD-10-CM | POA: Diagnosis not present

## 2024-11-30 NOTE — Patient Instructions (Addendum)
 Cryotherapy Aftercare  Wash gently with soap and water everyday.   Apply Vaseline and Band-Aid daily until healed.   Wound Care Instructions  Cleanse wound gently with soap and water once a day then pat dry with clean gauze. Apply a thin coat of Petrolatum (petroleum jelly, Vaseline) over the wound (unless you have an allergy to this). We recommend that you use a new, sterile tube of Vaseline. Do not pick or remove scabs. Do not remove the yellow or white healing tissue from the base of the wound.  Cover the wound with fresh, clean, nonstick gauze and secure with paper tape. You may use Band-Aids in place of gauze and tape if the wound is small enough, but would recommend trimming much of the tape off as there is often too much. Sometimes Band-Aids can irritate the skin.  You should call the office for your biopsy report after 1 week if you have not already been contacted.  If you experience any problems, such as abnormal amounts of bleeding, swelling, significant bruising, significant pain, or evidence of infection, please call the office immediately.  FOR ADULT SURGERY PATIENTS: If you need something for pain relief you may take 1 extra strength Tylenol (acetaminophen) AND 2 Ibuprofen (200mg  each) together every 4 hours as needed for pain. (do not take these if you are allergic to them or if you have a reason you should not take them.) Typically, you may only need pain medication for 1 to 3 days.     Due to recent changes in healthcare laws, you may see results of your pathology and/or laboratory studies on MyChart before the doctors have had a chance to review them. We understand that in some cases there may be results that are confusing or concerning to you. Please understand that not all results are received at the same time and often the doctors may need to interpret multiple results in order to provide you with the best plan of care or course of treatment. Therefore, we ask that you  please give us  2 business days to thoroughly review all your results before contacting the office for clarification. Should we see a critical lab result, you will be contacted sooner.   If You Need Anything After Your Visit  If you have any questions or concerns for your doctor, please call our main line at [phone] and press option 4 to reach your doctor's medical assistant. If no one answers, please leave a voicemail as directed and we will return your call as soon as possible. Messages left after 4 pm will be answered the following business day.   You may also send us  a message via MyChart. We typically respond to MyChart messages within 1-2 business days.  For prescription refills, please ask your pharmacy to contact our office. Our fax number is [phone].  If you have an urgent issue when the clinic is closed that cannot wait until the next business day, you can page your doctor at the number below.    Please note that while we do our best to be available for urgent issues outside of office hours, we are not available 24/7.   If you have an urgent issue and are unable to reach us , you may choose to seek medical care at your doctor's office, retail clinic, urgent care center, or emergency room.  If you have a medical emergency, please immediately call 911 or go to the emergency department.  Pager Numbers  - Dr. Hester: [phone]  -  Dr. Jackquline: [phone]  - Dr. Claudene: [phone]   - Dr. Raymund: [phone]  In the event of inclement weather, please call our main line at [phone] for an update on the status of any delays or closures.  Dermatology Medication Tips: Please keep the boxes that topical medications come in in order to help keep track of the instructions about where and how to use these. Pharmacies typically print the medication instructions only on the boxes and not directly on the medication tubes.   If your medication is too expensive, please  contact our office at [phone] option 4 or send us  a message through MyChart.   We are unable to tell what your co-pay for medications will be in advance as this is different depending on your insurance coverage. However, we may be able to find a substitute medication at lower cost or fill out paperwork to get insurance to cover a needed medication.   If a prior authorization is required to get your medication covered by your insurance company, please allow us  1-2 business days to complete this process.  Drug prices often vary depending on where the prescription is filled and some pharmacies may offer cheaper prices.  The website www.goodrx.com contains coupons for medications through different pharmacies. The prices here do not account for what the cost may be with help from insurance (it may be cheaper with your insurance), but the website can give you the price if you did not use any insurance.  - You can print the associated coupon and take it with your prescription to the pharmacy.  - You may also stop by our office during regular business hours and pick up a GoodRx coupon card.  - If you need your prescription sent electronically to a different pharmacy, notify our office through [facility] or by phone at [phone] option 4.     Si Usted Necesita Algo Despus de Su Visita  Tambin puede enviarnos un mensaje a travs de Clinical cytogeneticist. Por lo general respondemos a los mensajes de MyChart en el transcurso de 1 a 2 das hbiles.  Para renovar recetas, por favor pida a su farmacia que se ponga en contacto con nuestra oficina. Randi lakes de fax es [place] [phone].  Si tiene un asunto urgente cuando la clnica est cerrada y que no puede esperar hasta el siguiente da hbil, puede llamar/localizar a su doctor(a) al nmero que aparece a continuacin.   Por favor, tenga en cuenta que aunque hacemos todo lo posible para estar disponibles para asuntos urgentes fuera del horario de  [place], no estamos disponibles las 24 horas del da, los 7 [address]  [address] de la [place].   Si tiene un problema urgente y no puede comunicarse con nosotros, puede optar por buscar atencin mdica  en el consultorio de su doctor(a), en una clnica privada, en un centro de atencin urgente o en una sala de emergencias.  Si tiene Engineer, drilling, por favor llame inmediatamente al 911 o vaya a la sala de emergencias.  Nmeros de bper  - Dr. Hester: [phone]  - Dra. Jackquline: [phone]  - Dr. Claudene: [phone]  - Dra. Kitts: [phone]  En caso de inclemencias del [place], por favor llame a nuestra lnea principal al [phone] para una actualizacin sobre el estado de cualquier retraso o cierre.  Consejos para la medicacin en dermatologa: Por favor, guarde las cajas en las que vienen los medicamentos de uso tpico para ayudarle a seguir las instrucciones sobre dnde y cmo usarlos. [employer]  generalmente imprimen las instrucciones del medicamento slo en las cajas y no directamente en los tubos del [place].   Si su medicamento es muy caro, por favor, pngase en contacto con landry rieger llamando al [phone] y presione la opcin 4 o envenos un mensaje a travs de Clinical cytogeneticist.   No podemos decirle cul ser su copago por los medicamentos por adelantado ya que esto es diferente dependiendo de la cobertura de su seguro. Sin embargo, es posible que podamos encontrar un medicamento sustituto a Audiological scientist un formulario para que el seguro cubra el medicamento que se considera necesario.   Si se requiere una autorizacin previa para que su compaa de seguros malta su medicamento, por favor permtanos de 1 a 2 das hbiles para completar este proceso.  Los precios de los medicamentos varan con frecuencia dependiendo del Environmental consultant de dnde se surte la receta y alguna farmacias pueden ofrecer precios ms baratos.  El sitio web www.goodrx.com tiene cupones para  medicamentos de Health and safety inspector. Los precios aqu no tienen en cuenta lo que podra costar con la ayuda del seguro (puede ser ms barato con su seguro), pero el sitio web puede darle el precio si no utiliz Tourist information centre manager.  - Puede imprimir el cupn correspondiente y llevarlo con su receta a la farmacia.  - Tambin puede pasar por nuestra oficina durante el horario de atencin regular y Education officer, museum una tarjeta de cupones de GoodRx.  - Si necesita que su receta se enve electrnicamente a una farmacia diferente, informe a nuestra oficina a travs de MyChart de [place] o por telfono llamando al [phone] y presione la opcin 4.

## 2024-11-30 NOTE — Progress Notes (Signed)
 "  Follow-Up Visit   Subjective  [name] Christensen is a 89 y.o. male who presents for the following: Skin Cancer Screening and Full Body Skin Exam, hx of precancers   The patient presents for Total-Body Skin Exam (TBSE) for skin cancer screening and mole check. The patient has spots, moles and lesions to be evaluated, some may be new or changing and the patient may have concern these could be cancer.  The following portions of the chart were reviewed this encounter and updated as appropriate: medications, allergies, medical history  Review of Systems:  No other skin or systemic complaints except as noted in HPI or Assessment and Plan.  Objective  Well appearing patient in no apparent distress; mood and affect are within normal limits.  A full examination was performed including scalp, head, eyes, ears, nose, lips, neck, chest, axillae, abdomen, back, buttocks, bilateral upper extremities, bilateral lower extremities, hands, feet, fingers, toes, fingernails, and toenails. All findings within normal limits unless otherwise noted below.   Relevant physical exam findings are noted in the Assessment and Plan.  left nose supratip 1.1 x 0.7 cm indurated plaque  right prox mandible 1.0 cm indurated plaque  left mandible x 1 Erythematous thin papules/macules with gritty scale.  right mid back x 4, left side x 1 (5) Stuck-on, waxy, tan-brown papule or plaque --Discussed benign etiology and prognosis.           Assessment & Plan   SKIN CANCER SCREENING PERFORMED TODAY.  ACTINIC DAMAGE - Chronic condition, secondary to cumulative UV/sun exposure - diffuse scaly erythematous macules with underlying dyspigmentation - Recommend daily broad spectrum sunscreen SPF 30+ to sun-exposed areas, reapply every 2 hours as needed.  - Staying in the shade or wearing long sleeves, sun glasses (UVA+UVB protection) and wide brim hats (4-inch brim around the entire circumference of the hat) are also  recommended for sun protection.  - Call for new or changing lesions.  LENTIGINES, SEBORRHEIC KERATOSES, HEMANGIOMAS - Benign normal skin lesions - Benign-appearing - Call for any changes  MELANOCYTIC NEVI - Tan-brown and/or pink-flesh-colored symmetric macules and papules - Benign appearing on exam today - Observation - Call clinic for new or changing moles - Recommend daily use of broad spectrum spf 30+ sunscreen to sun-exposed areas.     NEOPLASM OF SKIN (2) left nose supratip - Skin / nail biopsy Type of biopsy: tangential   Informed consent: discussed and consent obtained   Patient was prepped and draped in usual sterile fashion: area prepped with alochol. Anesthesia: the lesion was anesthetized in a standard fashion   Anesthetic:  1% lidocaine  w/ epinephrine  1-100,000 buffered w/ 8.4% NaHCO3 Instrument used: flexible razor blade   Hemostasis achieved with: pressure, aluminum chloride and electrodesiccation   Outcome: patient tolerated procedure well   Post-procedure details: wound care instructions given   Post-procedure details comment:  Ointment and small bandage  Specimen 2 - Surgical pathology Differential Diagnosis: R/O BCC  Check Margins: No right prox mandible - Skin / nail biopsy Type of biopsy: tangential   Informed consent: discussed and consent obtained   Patient was prepped and draped in usual sterile fashion: area prepped with alochol. Anesthesia: the lesion was anesthetized in a standard fashion   Anesthetic:  1% lidocaine  w/ epinephrine  1-100,000 buffered w/ 8.4% NaHCO3 Instrument used: flexible razor blade   Hemostasis achieved with: pressure, aluminum chloride and electrodesiccation   Outcome: patient tolerated procedure well   Post-procedure details: wound care instructions given   Post-procedure  details comment:  Ointment and small bandage  Specimen 1 - Surgical pathology Differential Diagnosis: R/O BCC  Check Margins: No AK (ACTINIC  KERATOSIS) left mandible x 1 ACTINIC DAMAGE - chronic, secondary to cumulative UV radiation exposure/sun exposure over time - diffuse scaly erythematous macules with underlying dyspigmentation - Recommend daily broad spectrum sunscreen SPF 30+ to sun-exposed areas, reapply every 2 hours as needed.  - Recommend staying in the shade or wearing long sleeves, sun glasses (UVA+UVB protection) and wide brim hats (4-inch brim around the entire circumference of the hat). - Call for new or changing lesions.  - Destruction of lesion - left mandible x 1 Complexity: simple   Destruction method: cryotherapy   Informed consent: discussed and consent obtained   Timeout:  patient name, date of birth, surgical site, and procedure verified Lesion destroyed using liquid nitrogen: Yes   Region frozen until ice ball extended beyond lesion: Yes   Outcome: patient tolerated procedure well with no complications   Post-procedure details: wound care instructions given    INFLAMED SEBORRHEIC KERATOSIS (5) right mid back x 4, left side x 1 (5) - Destruction of lesion - right mid back x 4, left side x 1 (5) Complexity: simple   Destruction method: cryotherapy   Informed consent: discussed and consent obtained   Timeout:  patient name, date of birth, surgical site, and procedure verified Lesion destroyed using liquid nitrogen: Yes   Region frozen until ice ball extended beyond lesion: Yes   Outcome: patient tolerated procedure well with no complications   Post-procedure details: wound care instructions given      Return in about 1 year (around 11/30/2025) for TBSE, .  I, Fay Kirks, CMA, am acting as scribe for Alm Rhyme, MD .   Documentation: I have reviewed the above documentation for accuracy and completeness, and I agree with the above.  Alm Rhyme, MD    "

## 2024-12-02 ENCOUNTER — Ambulatory Visit: Payer: Self-pay | Admitting: Dermatology

## 2024-12-02 ENCOUNTER — Encounter: Payer: Self-pay | Admitting: Dermatology

## 2024-12-02 DIAGNOSIS — C4492 Squamous cell carcinoma of skin, unspecified: Secondary | ICD-10-CM

## 2024-12-02 DIAGNOSIS — C44311 Basal cell carcinoma of skin of nose: Secondary | ICD-10-CM

## 2024-12-02 LAB — SURGICAL PATHOLOGY

## 2024-12-02 NOTE — Telephone Encounter (Addendum)
 Tried calling patient about results and to schedule treatment. No answer. Lm for patient to return call.    ----- Message from Alm Rhyme, MD sent at 12/02/2024  2:51 PM EST ----- FINAL DIAGNOSIS        1. Skin, right prox mandible :       WELL DIFFERENTIATED SQUAMOUS CELL CARCINOMA        2. Skin, left nose supratip :       BASAL CELL CARCINOMA, SUPERFICIAL AND NODULAR PATTERNS    1- Cancer = SCC Schedule for MOHS 2- Cancer = BCC Schedule for MOHS

## 2024-12-06 ENCOUNTER — Encounter: Payer: Self-pay | Admitting: Dermatology

## 2024-12-06 NOTE — Telephone Encounter (Addendum)
 Called and discussed results with patient. Patient verbalized understanding. Would prefer treatment with Dr. Paci. Give patient contact information for Dr. Paci. Referral sent.   Will recheck areas at next followup  ----- Message from Alm Rhyme, MD sent at 12/02/2024  2:51 PM EST ----- FINAL DIAGNOSIS        1. Skin, right prox mandible :       WELL DIFFERENTIATED SQUAMOUS CELL CARCINOMA        2. Skin, left nose supratip :       BASAL CELL CARCINOMA, SUPERFICIAL AND NODULAR PATTERNS    1- Cancer = SCC Schedule for MOHS 2- Cancer = BCC Schedule for MOHS

## 2024-12-06 NOTE — Addendum Note (Signed)
 Addended by: TANDA SETTER A on: 12/06/2024 11:19 AM   Modules accepted: Orders

## 2024-12-07 ENCOUNTER — Ambulatory Visit: Admitting: Internal Medicine

## 2024-12-14 ENCOUNTER — Other Ambulatory Visit: Payer: Self-pay | Admitting: Internal Medicine

## 2024-12-22 ENCOUNTER — Ambulatory Visit: Admitting: Internal Medicine

## 2024-12-29 ENCOUNTER — Ambulatory Visit

## 2024-12-29 LAB — CUP PACEART REMOTE DEVICE CHECK
Battery Remaining Longevity: 14 mo
Battery Remaining Percentage: 16 %
Battery Voltage: 2.74 V
Brady Statistic AP VP Percent: 1 %
Brady Statistic AP VS Percent: 78 %
Brady Statistic AS VP Percent: 1 %
Brady Statistic AS VS Percent: 20 %
Brady Statistic RA Percent Paced: 76 %
Brady Statistic RV Percent Paced: 1.2 %
Date Time Interrogation Session: 20260204020019
HighPow Impedance: 72 Ohm
HighPow Impedance: 72 Ohm
Implantable Lead Connection Status: 753985
Implantable Lead Connection Status: 753985
Implantable Lead Implant Date: 20131218
Implantable Lead Implant Date: 20180514
Implantable Lead Location: 753859
Implantable Lead Location: 753860
Implantable Lead Model: 5076
Implantable Pulse Generator Implant Date: 20180514
Lead Channel Impedance Value: 400 Ohm
Lead Channel Impedance Value: 550 Ohm
Lead Channel Pacing Threshold Amplitude: 0.5 V
Lead Channel Pacing Threshold Amplitude: 1.25 V
Lead Channel Pacing Threshold Pulse Width: 0.5 ms
Lead Channel Pacing Threshold Pulse Width: 0.5 ms
Lead Channel Sensing Intrinsic Amplitude: 3.3 mV
Lead Channel Sensing Intrinsic Amplitude: 6.7 mV
Lead Channel Setting Pacing Amplitude: 1.5 V
Lead Channel Setting Pacing Amplitude: 2 V
Lead Channel Setting Pacing Pulse Width: 0.5 ms
Lead Channel Setting Sensing Sensitivity: 0.5 mV
Pulse Gen Serial Number: 1245762
Zone Setting Status: 755011

## 2024-12-30 ENCOUNTER — Encounter: Admitting: Dermatology

## 2025-02-01 ENCOUNTER — Ambulatory Visit: Admitting: Cardiology

## 2025-02-02 ENCOUNTER — Ambulatory Visit: Admitting: Internal Medicine

## 2025-02-08 ENCOUNTER — Encounter: Admitting: Dermatology

## 2025-02-08 ENCOUNTER — Ambulatory Visit: Admitting: Internal Medicine

## 2025-03-30 ENCOUNTER — Ambulatory Visit

## 2025-04-05 ENCOUNTER — Ambulatory Visit

## 2025-06-29 ENCOUNTER — Ambulatory Visit
